# Patient Record
Sex: Female | Born: 1965 | Race: White | Hispanic: No | Marital: Married | Smoking: Current every day smoker
Health system: Southern US, Community
[De-identification: ages and names within clinical notes are randomized; demographics above are authoritative.]

## PROBLEM LIST (undated history)

## (undated) DIAGNOSIS — E079 Disorder of thyroid, unspecified: Secondary | ICD-10-CM

## (undated) DIAGNOSIS — I319 Disease of pericardium, unspecified: Secondary | ICD-10-CM

## (undated) DIAGNOSIS — J45909 Unspecified asthma, uncomplicated: Secondary | ICD-10-CM

## (undated) DIAGNOSIS — Z87442 Personal history of urinary calculi: Secondary | ICD-10-CM

## (undated) DIAGNOSIS — G629 Polyneuropathy, unspecified: Secondary | ICD-10-CM

## (undated) DIAGNOSIS — J449 Chronic obstructive pulmonary disease, unspecified: Secondary | ICD-10-CM

## (undated) DIAGNOSIS — I10 Essential (primary) hypertension: Secondary | ICD-10-CM

## (undated) DIAGNOSIS — M1990 Unspecified osteoarthritis, unspecified site: Secondary | ICD-10-CM

## (undated) HISTORY — PX: APPENDECTOMY: SHX54

## (undated) HISTORY — PX: JOINT REPLACEMENT: SHX530

## (undated) HISTORY — PX: OVARIAN CYST SURGERY: SHX726

## (undated) HISTORY — DX: Chronic obstructive pulmonary disease, unspecified: J44.9

## (undated) HISTORY — PX: OTHER SURGICAL HISTORY: SHX169

## (undated) HISTORY — DX: Unspecified osteoarthritis, unspecified site: M19.90

## (undated) HISTORY — DX: Essential (primary) hypertension: I10

## (undated) HISTORY — DX: Polyneuropathy, unspecified: G62.9

## (undated) HISTORY — PX: TOTAL HIP ARTHROPLASTY: SHX124

## (undated) HISTORY — DX: Unspecified asthma, uncomplicated: J45.909

## (undated) HISTORY — PX: LEG SURGERY: SHX1003

## (undated) HISTORY — DX: Personal history of urinary calculi: Z87.442

---

## 2004-02-17 ENCOUNTER — Emergency Department: Payer: Self-pay | Admitting: Emergency Medicine

## 2004-09-11 ENCOUNTER — Emergency Department: Payer: Self-pay | Admitting: Unknown Physician Specialty

## 2004-09-11 ENCOUNTER — Other Ambulatory Visit: Payer: Self-pay

## 2005-04-28 ENCOUNTER — Emergency Department: Payer: Self-pay | Admitting: Emergency Medicine

## 2006-01-28 ENCOUNTER — Emergency Department: Payer: Self-pay | Admitting: Emergency Medicine

## 2006-05-18 ENCOUNTER — Other Ambulatory Visit: Payer: Self-pay

## 2006-05-18 ENCOUNTER — Emergency Department: Payer: Self-pay | Admitting: Emergency Medicine

## 2006-05-20 ENCOUNTER — Emergency Department: Payer: Self-pay | Admitting: Emergency Medicine

## 2006-05-21 ENCOUNTER — Encounter: Payer: Self-pay | Admitting: Emergency Medicine

## 2006-05-25 ENCOUNTER — Emergency Department: Payer: Self-pay | Admitting: Emergency Medicine

## 2006-05-28 ENCOUNTER — Encounter: Payer: Self-pay | Admitting: Emergency Medicine

## 2006-08-12 ENCOUNTER — Emergency Department: Payer: Self-pay | Admitting: Unknown Physician Specialty

## 2006-08-12 ENCOUNTER — Other Ambulatory Visit: Payer: Self-pay

## 2007-02-23 ENCOUNTER — Emergency Department: Payer: Self-pay | Admitting: Emergency Medicine

## 2008-06-11 ENCOUNTER — Ambulatory Visit: Payer: Self-pay | Admitting: Family Medicine

## 2008-06-29 ENCOUNTER — Emergency Department: Payer: Self-pay | Admitting: Emergency Medicine

## 2008-09-03 ENCOUNTER — Encounter: Payer: Self-pay | Admitting: Family Medicine

## 2008-09-25 ENCOUNTER — Encounter: Payer: Self-pay | Admitting: Family Medicine

## 2008-09-30 ENCOUNTER — Emergency Department: Payer: Self-pay | Admitting: Emergency Medicine

## 2008-10-19 ENCOUNTER — Emergency Department: Payer: Self-pay | Admitting: Emergency Medicine

## 2008-12-02 ENCOUNTER — Emergency Department: Payer: Self-pay | Admitting: Emergency Medicine

## 2008-12-03 ENCOUNTER — Inpatient Hospital Stay: Payer: Self-pay | Admitting: Internal Medicine

## 2009-05-09 ENCOUNTER — Emergency Department: Payer: Self-pay | Admitting: Emergency Medicine

## 2009-07-02 ENCOUNTER — Ambulatory Visit: Payer: Self-pay | Admitting: Certified Nurse Midwife

## 2010-01-17 ENCOUNTER — Emergency Department: Payer: Self-pay | Admitting: Unknown Physician Specialty

## 2010-01-23 ENCOUNTER — Emergency Department: Payer: Self-pay | Admitting: Emergency Medicine

## 2011-10-04 ENCOUNTER — Emergency Department: Payer: Self-pay | Admitting: Emergency Medicine

## 2012-01-27 ENCOUNTER — Emergency Department: Payer: Self-pay | Admitting: Emergency Medicine

## 2012-04-13 ENCOUNTER — Emergency Department: Payer: Self-pay | Admitting: Emergency Medicine

## 2012-09-08 ENCOUNTER — Emergency Department: Payer: Self-pay | Admitting: Emergency Medicine

## 2012-09-08 LAB — URINALYSIS, COMPLETE
Bilirubin,UR: NEGATIVE
Nitrite: NEGATIVE
Ph: 7 (ref 4.5–8.0)
Protein: NEGATIVE
RBC,UR: 2 /HPF (ref 0–5)
Squamous Epithelial: 6

## 2012-09-08 LAB — BASIC METABOLIC PANEL
Anion Gap: 4 — ABNORMAL LOW (ref 7–16)
BUN: 14 mg/dL (ref 7–18)
Co2: 30 mmol/L (ref 21–32)
Creatinine: 1.02 mg/dL (ref 0.60–1.30)
Glucose: 89 mg/dL (ref 65–99)
Osmolality: 277 (ref 275–301)
Potassium: 3.8 mmol/L (ref 3.5–5.1)
Sodium: 139 mmol/L (ref 136–145)

## 2012-09-08 LAB — CBC
HGB: 13.7 g/dL (ref 12.0–16.0)
MCH: 29.3 pg (ref 26.0–34.0)
MCV: 88 fL (ref 80–100)
Platelet: 419 10*3/uL (ref 150–440)
WBC: 12.5 10*3/uL — ABNORMAL HIGH (ref 3.6–11.0)

## 2012-09-08 LAB — PREGNANCY, URINE: Pregnancy Test, Urine: NEGATIVE m[IU]/mL

## 2013-09-29 ENCOUNTER — Ambulatory Visit: Payer: Self-pay | Admitting: Family Medicine

## 2013-09-29 LAB — HEPATIC FUNCTION PANEL
ALT: 23 U/L (ref 7–35)
AST: 16 U/L (ref 13–35)

## 2014-01-18 ENCOUNTER — Emergency Department: Payer: Self-pay | Admitting: Student

## 2014-06-05 ENCOUNTER — Ambulatory Visit: Payer: Self-pay | Admitting: Family Medicine

## 2014-06-05 LAB — TSH: TSH: 1.46 u[IU]/mL (ref 0.41–5.90)

## 2014-06-05 LAB — BASIC METABOLIC PANEL
BUN: 15 mg/dL (ref 4–21)
CREATININE: 0.8 mg/dL (ref 0.5–1.1)
Glucose: 94 mg/dL
Potassium: 4.5 mmol/L (ref 3.4–5.3)
Sodium: 139 mmol/L (ref 137–147)

## 2014-06-05 LAB — LIPID PANEL
CHOLESTEROL: 128 mg/dL (ref 0–200)
HDL: 34 mg/dL — AB (ref 35–70)
LDL Cholesterol: 75 mg/dL
Triglycerides: 95 mg/dL (ref 40–160)

## 2014-08-05 ENCOUNTER — Emergency Department: Admit: 2014-08-05 | Disposition: A | Discharge status: Home / Self Care | Payer: Self-pay | Admitting: Internal Medicine

## 2014-08-05 LAB — COMPREHENSIVE METABOLIC PANEL
ALT: 31 U/L
AST: 30 U/L
Albumin: 3.9 g/dL
Alkaline Phosphatase: 60 U/L
Anion Gap: 8 (ref 7–16)
BUN: 23 mg/dL — ABNORMAL HIGH
Bilirubin,Total: 0.9 mg/dL
CALCIUM: 8.2 mg/dL — AB
CHLORIDE: 102 mmol/L
CO2: 25 mmol/L
Creatinine: 0.86 mg/dL
EGFR (African American): >60
GLUCOSE: 136 mg/dL — AB
POTASSIUM: 4.5 mmol/L
SODIUM: 135 mmol/L
Total Protein: 7.8 g/dL

## 2014-08-05 LAB — URINALYSIS, COMPLETE
BLOOD: NEGATIVE
Bilirubin,UR: NEGATIVE
Glucose,UR: NEGATIVE mg/dL (ref 0–75)
Leukocyte Esterase: NEGATIVE
Nitrite: NEGATIVE
PH: 5 (ref 4.5–8.0)
PROTEIN: NEGATIVE
Specific Gravity: 1.025 (ref 1.003–1.030)

## 2014-08-05 LAB — CBC
HCT: 44.8 % (ref 35.0–47.0)
HGB: 14.4 g/dL (ref 12.0–16.0)
MCH: 28.5 pg (ref 26.0–34.0)
MCHC: 32.1 g/dL (ref 32.0–36.0)
MCV: 89 fL (ref 80–100)
Platelet: 362 10*3/uL (ref 150–440)
RBC: 5.04 10*6/uL (ref 3.80–5.20)
RDW: 15.6 % — AB (ref 11.5–14.5)
WBC: 12.9 10*3/uL — ABNORMAL HIGH (ref 3.6–11.0)

## 2014-08-05 LAB — LIPASE, BLOOD: LIPASE: 45 U/L

## 2014-09-29 ENCOUNTER — Other Ambulatory Visit: Payer: Self-pay | Admitting: Family Medicine

## 2014-11-29 ENCOUNTER — Other Ambulatory Visit: Payer: Self-pay | Admitting: Family Medicine

## 2014-11-29 DIAGNOSIS — M545 Low back pain, unspecified: Secondary | ICD-10-CM | POA: Insufficient documentation

## 2014-11-29 DIAGNOSIS — F17210 Nicotine dependence, cigarettes, uncomplicated: Secondary | ICD-10-CM | POA: Insufficient documentation

## 2014-11-29 DIAGNOSIS — E039 Hypothyroidism, unspecified: Secondary | ICD-10-CM | POA: Insufficient documentation

## 2014-11-29 DIAGNOSIS — I10 Essential (primary) hypertension: Secondary | ICD-10-CM | POA: Insufficient documentation

## 2014-11-29 DIAGNOSIS — Z87442 Personal history of urinary calculi: Secondary | ICD-10-CM

## 2014-11-29 DIAGNOSIS — J45909 Unspecified asthma, uncomplicated: Secondary | ICD-10-CM | POA: Insufficient documentation

## 2014-11-29 DIAGNOSIS — F439 Reaction to severe stress, unspecified: Secondary | ICD-10-CM | POA: Insufficient documentation

## 2014-11-29 DIAGNOSIS — F329 Major depressive disorder, single episode, unspecified: Secondary | ICD-10-CM | POA: Insufficient documentation

## 2014-11-29 DIAGNOSIS — F4310 Post-traumatic stress disorder, unspecified: Secondary | ICD-10-CM | POA: Insufficient documentation

## 2014-11-29 DIAGNOSIS — F17200 Nicotine dependence, unspecified, uncomplicated: Secondary | ICD-10-CM | POA: Insufficient documentation

## 2014-11-29 DIAGNOSIS — E559 Vitamin D deficiency, unspecified: Secondary | ICD-10-CM | POA: Insufficient documentation

## 2014-11-29 DIAGNOSIS — R109 Unspecified abdominal pain: Secondary | ICD-10-CM | POA: Insufficient documentation

## 2014-11-29 DIAGNOSIS — K219 Gastro-esophageal reflux disease without esophagitis: Secondary | ICD-10-CM | POA: Insufficient documentation

## 2014-11-29 DIAGNOSIS — F32A Depression, unspecified: Secondary | ICD-10-CM | POA: Insufficient documentation

## 2014-11-29 DIAGNOSIS — G4700 Insomnia, unspecified: Secondary | ICD-10-CM | POA: Insufficient documentation

## 2014-11-29 DIAGNOSIS — L732 Hidradenitis suppurativa: Secondary | ICD-10-CM | POA: Insufficient documentation

## 2014-11-29 DIAGNOSIS — Z8659 Personal history of other mental and behavioral disorders: Secondary | ICD-10-CM | POA: Insufficient documentation

## 2014-11-29 DIAGNOSIS — M1990 Unspecified osteoarthritis, unspecified site: Secondary | ICD-10-CM | POA: Insufficient documentation

## 2014-11-29 DIAGNOSIS — Z789 Other specified health status: Secondary | ICD-10-CM | POA: Insufficient documentation

## 2014-11-29 DIAGNOSIS — R200 Anesthesia of skin: Secondary | ICD-10-CM | POA: Insufficient documentation

## 2014-11-29 DIAGNOSIS — F419 Anxiety disorder, unspecified: Secondary | ICD-10-CM | POA: Insufficient documentation

## 2014-11-29 DIAGNOSIS — Z87898 Personal history of other specified conditions: Secondary | ICD-10-CM | POA: Insufficient documentation

## 2014-11-29 DIAGNOSIS — J449 Chronic obstructive pulmonary disease, unspecified: Secondary | ICD-10-CM | POA: Insufficient documentation

## 2014-11-29 DIAGNOSIS — Z720 Tobacco use: Secondary | ICD-10-CM

## 2014-11-29 DIAGNOSIS — E785 Hyperlipidemia, unspecified: Secondary | ICD-10-CM | POA: Insufficient documentation

## 2014-11-29 HISTORY — DX: Personal history of urinary calculi: Z87.442

## 2014-11-29 NOTE — Telephone Encounter (Signed)
Next ov is on 08/30/2014

## 2014-11-30 ENCOUNTER — Ambulatory Visit (INDEPENDENT_AMBULATORY_CARE_PROVIDER_SITE_OTHER): Payer: Medicaid Other | Admitting: Family Medicine

## 2014-11-30 ENCOUNTER — Encounter: Payer: Self-pay | Admitting: Family Medicine

## 2014-11-30 VITALS — BP 122/64 | HR 85 | Temp 98.5°F | Resp 16 | Wt 324.2 lb

## 2014-11-30 DIAGNOSIS — R0609 Other forms of dyspnea: Secondary | ICD-10-CM | POA: Diagnosis not present

## 2014-11-30 DIAGNOSIS — R609 Edema, unspecified: Secondary | ICD-10-CM

## 2014-11-30 DIAGNOSIS — J45909 Unspecified asthma, uncomplicated: Secondary | ICD-10-CM | POA: Diagnosis not present

## 2014-11-30 DIAGNOSIS — M1990 Unspecified osteoarthritis, unspecified site: Secondary | ICD-10-CM

## 2014-11-30 DIAGNOSIS — E039 Hypothyroidism, unspecified: Secondary | ICD-10-CM | POA: Diagnosis not present

## 2014-11-30 DIAGNOSIS — I10 Essential (primary) hypertension: Secondary | ICD-10-CM | POA: Diagnosis not present

## 2014-11-30 DIAGNOSIS — I890 Lymphedema, not elsewhere classified: Secondary | ICD-10-CM | POA: Insufficient documentation

## 2014-11-30 MED ORDER — MOMETASONE FURO-FORMOTEROL FUM 100-5 MCG/ACT IN AERO: 2.0000 | INHALATION_SPRAY | Freq: Two times a day (BID) | RESPIRATORY_TRACT | Status: DC

## 2014-11-30 MED ORDER — FUROSEMIDE 20 MG PO TABS: 20.0000 mg | ORAL_TABLET | Freq: Every day | ORAL | Status: DC

## 2014-11-30 NOTE — Progress Notes (Signed)
Patient ID: Cheryl Chandler, female   DOB: [birth year], 49 y.o.   MRN: [id number]       Patient: Cheryl Chandler Female    DOB: 1965-06-29   49 y.o.   MRN: [id number] Visit Date: 11/30/2014  Today's Provider: Mila Merry, MD   Chief Complaint  Patient presents with  . Hyperlipidemia    follow-up  . Hypertension    follow-up  . Hypothyroidism    follow-up   Subjective:    Hyperlipidemia This is a chronic problem. The current episode started more than 1 year ago. Exacerbating diseases include obesity. Associated symptoms include leg pain and shortness of breath. Risk factors for coronary artery disease include family history, hypertension and obesity.  Hypertension This is a chronic problem. Associated symptoms include anxiety, headaches, palpitations, peripheral edema and shortness of breath.       Allergies  Allergen Reactions  . Codeine Shortness Of Breath and Rash  . Sulfa Antibiotics Shortness Of Breath and Rash  . Aspirin Hives  . Bee Venom     Bee stings  . Darvon [Propoxyphene]     Darvocet-N 100  . Latex   . Penicillins    Previous Medications   ALBUTEROL (PROAIR HFA) 108 (90 BASE) MCG/ACT INHALER    Inhale into the lungs.   ALBUTEROL (PROVENTIL) (2.5 MG/3ML) 0.083% NEBULIZER SOLUTION    Inhale into the lungs.   BECLOMETHASONE (QVAR) 80 MCG/ACT INHALER    Inhale into the lungs.   CHOLECALCIFEROL (VITAMIN D3) 2000 UNITS TABS    Take by mouth.   CITALOPRAM (CELEXA) 20 MG TABLET    take 1 tablet by mouth once daily   EPIPEN 2-PAK 0.3 MG/0.3ML SOAJ INJECTION    use as directed by prescriber   HYDROCODONE-ACETAMINOPHEN (NORCO/VICODIN) 5-325 MG PER TABLET    Take by mouth.   LEVOTHYROXINE (SYNTHROID, LEVOTHROID) 50 MCG TABLET    Take by mouth.   LISINOPRIL (PRINIVIL,ZESTRIL) 20 MG TABLET    Take by mouth.   MELOXICAM (MOBIC) 15 MG TABLET    take 1 tablet by mouth once daily if needed for pain   METHOCARBAMOL (ROBAXIN) 750 MG TABLET    Take by mouth.   MONTELUKAST (SINGULAIR) 10 MG TABLET    Take by mouth.   NITROGLYCERIN (NITROSTAT) 0.4 MG SL TABLET    Place under the tongue.   OMEPRAZOLE (PRILOSEC) 20 MG CAPSULE    take 1 capsule by mouth once daily   SIMVASTATIN (ZOCOR) 40 MG TABLET    Take by mouth.    Review of Systems  Respiratory: Positive for chest tightness and shortness of breath.   Cardiovascular: Positive for palpitations and leg swelling.  Neurological: Positive for weakness and headaches.  Psychiatric/Behavioral: Positive for sleep disturbance.    History  Substance Use Topics  . Smoking status: Current Every Day Smoker -- 0.25 packs/day for 30 years    Types: Cigarettes  . Smokeless tobacco: Never Used  . Alcohol Use: No   Objective:   BP 122/64 mmHg  Pulse 85  Temp(Src) 98.5 F (36.9 C) (Oral)  Resp 16  Wt 324 lb 3.2 oz (147.056 kg)  LMP 11/18/2014  Physical Exam  General Appearance:    Alert, cooperative, no distress, obese  Eyes:    PERRL, conjunctiva/corneas clear, EOM's intact       Lungs:     Clear to auscultation bilaterally, respirations unlabored  Heart:    Regular rate and rhythm, 2+ bilateral edema  Neurologic:   Awake, alert,  oriented x 3. No apparent focal neurological           defect.          Assessment & Plan:     1. Hypothyroidism, unspecified hypothyroidism type  - T4 AND TSH  2. Essential hypertension well controlled Continue current medications.     3. Arthritis   4. Edema  - Comprehensive metabolic panel - CBC - furosemide (LASIX) 20 MG tablet; Take 1 tablet (20 mg total) by mouth daily.  Dispense: 30 tablet; Refill: 0  5. Dyspnea on exertion Considering edema will need to rule out cardiac origin.  - Brain natriuretic peptide  6. Airway hyperreactivity, unspecified asthma severity, uncomplicated Not controlled.  - mometasone-formoterol (DULERA) 100-5 MCG/ACT AERO; Inhale 2 puffs into the lungs 2 (two) times daily.  Dispense: 1 Inhaler; Refill: 0       Mila Merry, MD  [facility]

## 2014-12-01 LAB — COMPREHENSIVE METABOLIC PANEL
ALBUMIN: 4.1 g/dL (ref 3.5–5.5)
ALT: 43 IU/L — ABNORMAL HIGH (ref 0–32)
AST: 37 IU/L (ref 0–40)
Albumin/Globulin Ratio: 1.5 (ref 1.1–2.5)
Alkaline Phosphatase: 80 IU/L (ref 39–117)
BUN/Creatinine Ratio: 19 (ref 9–23)
BUN: 16 mg/dL (ref 6–24)
Bilirubin Total: 0.4 mg/dL (ref 0.0–1.2)
CO2: 23 mmol/L (ref 18–29)
Calcium: 8.9 mg/dL (ref 8.7–10.2)
Chloride: 100 mmol/L (ref 97–108)
Creatinine, Ser: 0.83 mg/dL (ref 0.57–1.00)
GFR calc non Af Amer: 84 mL/min/{1.73_m2} (ref >59)
GFR, EST AFRICAN AMERICAN: 96 mL/min/{1.73_m2} (ref >59)
GLOBULIN, TOTAL: 2.7 g/dL (ref 1.5–4.5)
GLUCOSE: 93 mg/dL (ref 65–99)
Potassium: 4.8 mmol/L (ref 3.5–5.2)
Sodium: 140 mmol/L (ref 134–144)
TOTAL PROTEIN: 6.8 g/dL (ref 6.0–8.5)

## 2014-12-01 LAB — CBC
HEMATOCRIT: 39.7 % (ref 34.0–46.6)
HEMOGLOBIN: 13.2 g/dL (ref 11.1–15.9)
MCH: 28.4 pg (ref 26.6–33.0)
MCHC: 33.2 g/dL (ref 31.5–35.7)
MCV: 86 fL (ref 79–97)
PLATELETS: 406 10*3/uL — AB (ref 150–379)
RBC: 4.64 x10E6/uL (ref 3.77–5.28)
RDW: 15.7 % — ABNORMAL HIGH (ref 12.3–15.4)
WBC: 10.6 10*3/uL (ref 3.4–10.8)

## 2014-12-01 LAB — T4 AND TSH
T4, Total: 9.9 ug/dL (ref 4.5–12.0)
TSH: 1.51 u[IU]/mL (ref 0.450–4.500)

## 2014-12-01 LAB — BRAIN NATRIURETIC PEPTIDE: BNP: 7.4 pg/mL (ref 0.0–100.0)

## 2014-12-03 ENCOUNTER — Telehealth: Payer: Self-pay | Admitting: Family Medicine

## 2014-12-03 NOTE — Telephone Encounter (Signed)
Patient notified of results.

## 2014-12-03 NOTE — Telephone Encounter (Signed)
Pt returned call

## 2014-12-03 NOTE — Telephone Encounter (Signed)
Ivar Drape called to get pt's results and then pt got on the phone and asked for her results. I advised I would send a message for a nurse to return her call. Thanks TNP

## 2014-12-05 ENCOUNTER — Telehealth: Payer: Self-pay | Admitting: Family Medicine

## 2014-12-05 NOTE — Telephone Encounter (Signed)
Pt has some questions about her labs and whether she is early stages of diabetes.  Please call her back.    Thanks Barth Kirks

## 2014-12-05 NOTE — Telephone Encounter (Signed)
Her blood sugar was normal. She does not have diabetes.

## 2014-12-05 NOTE — Telephone Encounter (Signed)
Please advise patient's question about diabetes?

## 2014-12-13 NOTE — Telephone Encounter (Signed)
Left message letting pt know.  Okay per dpr. Advised pt to cb if she has anymore questions.

## 2015-01-01 ENCOUNTER — Other Ambulatory Visit: Payer: Self-pay | Admitting: *Deleted

## 2015-01-01 DIAGNOSIS — R609 Edema, unspecified: Secondary | ICD-10-CM

## 2015-01-01 MED ORDER — FUROSEMIDE 20 MG PO TABS: 20.0000 mg | ORAL_TABLET | Freq: Every day | ORAL | Status: DC

## 2015-01-29 ENCOUNTER — Other Ambulatory Visit: Payer: Self-pay | Admitting: Family Medicine

## 2015-01-29 DIAGNOSIS — J45909 Unspecified asthma, uncomplicated: Secondary | ICD-10-CM

## 2015-01-29 MED ORDER — NITROGLYCERIN 0.4 MG SL SUBL: 0.4000 mg | SUBLINGUAL_TABLET | SUBLINGUAL | Status: DC | PRN

## 2015-01-29 MED ORDER — SIMVASTATIN 40 MG PO TABS: 40.0000 mg | ORAL_TABLET | Freq: Every day | ORAL | Status: DC

## 2015-01-29 MED ORDER — LISINOPRIL 20 MG PO TABS: 20.0000 mg | ORAL_TABLET | Freq: Every day | ORAL | Status: DC

## 2015-01-29 MED ORDER — MOMETASONE FURO-FORMOTEROL FUM 100-5 MCG/ACT IN AERO: 2.0000 | INHALATION_SPRAY | Freq: Two times a day (BID) | RESPIRATORY_TRACT | Status: DC

## 2015-01-29 NOTE — Telephone Encounter (Signed)
Last OV was on 11/30/2014. Pt stated that she wants something for back and leg pain. She said that she takes meloxicam and robaxin for pain which helps some, she also stated that she cannot take hydrocodone-acetaminophen anymore because she is allergic to codeine and when she takes it she gets sick with upper respiratory disease.  Please advise.  Thanks,

## 2015-01-29 NOTE — Telephone Encounter (Signed)
Pt contacted office for refill request on the following medications:  lisinopril (PRINIVIL,ZESTRIL) 20 MG,  simvastatin (ZOCOR) 40 MG and mometasone-formoterol (DULERA) 100-5 MCG/ACT AERO.  [facility] [place].  CB#[phone]/MW  Pt is also requesting a Rx for back and leg pain.  Pt states she can not take the HYDROcodone-acetaminophen/MW

## 2015-01-30 ENCOUNTER — Telehealth: Payer: Self-pay | Admitting: Family Medicine

## 2015-01-30 ENCOUNTER — Other Ambulatory Visit: Payer: Self-pay | Admitting: Family Medicine

## 2015-01-30 MED ORDER — TRAMADOL HCL 50 MG PO TABS: 50.0000 mg | ORAL_TABLET | Freq: Three times a day (TID) | ORAL | Status: DC | PRN

## 2015-01-30 NOTE — Telephone Encounter (Signed)
Please advise 

## 2015-01-30 NOTE — Telephone Encounter (Signed)
Can call in Tramadol  one every eight hours as needed for pain, #30, rf x 3.

## 2015-01-30 NOTE — Telephone Encounter (Signed)
Pt stated that her pain is getting worse and she thought a stronger pain medication was supposed to be sent to [facility] in [place] and to her knowledge nothing was sent in. Please advise. Thanks TNP

## 2015-01-30 NOTE — Telephone Encounter (Signed)
Rx called into pharmacy. Patient notified.

## 2015-02-28 ENCOUNTER — Other Ambulatory Visit: Payer: Self-pay | Admitting: Family Medicine

## 2015-03-29 ENCOUNTER — Other Ambulatory Visit: Payer: Self-pay | Admitting: Family Medicine

## 2015-05-07 ENCOUNTER — Ambulatory Visit: Payer: Medicaid Other | Admitting: Family Medicine

## 2015-05-31 ENCOUNTER — Other Ambulatory Visit: Payer: Self-pay | Admitting: Family Medicine

## 2015-05-31 NOTE — Telephone Encounter (Signed)
Please call in tramadol.  

## 2015-05-31 NOTE — Telephone Encounter (Signed)
Rx called in to pharmacy. 

## 2015-06-28 ENCOUNTER — Other Ambulatory Visit: Payer: Self-pay | Admitting: Family Medicine

## 2015-07-01 ENCOUNTER — Ambulatory Visit: Payer: Medicaid Other | Admitting: Family Medicine

## 2015-07-28 ENCOUNTER — Other Ambulatory Visit: Payer: Self-pay | Admitting: Family Medicine

## 2015-07-29 ENCOUNTER — Other Ambulatory Visit: Payer: Self-pay | Admitting: Family Medicine

## 2015-07-29 NOTE — Telephone Encounter (Signed)
Pt contacted office for refill request on the following medications:  [facility] [place]  CB#[phone]/MW  simvastatin (ZOCOR) 40 MG tablet  furosemide (LASIX) 20 MG tablet  lisinopril (PRINIVIL,ZESTRIL) 20 MG tablet  levothyroxine (SYNTHROID, LEVOTHROID) 50 MCG tablet

## 2015-07-29 NOTE — Telephone Encounter (Signed)
Rx's were sent to pharmacy 07/28/15.

## 2015-08-06 ENCOUNTER — Encounter: Payer: Self-pay | Admitting: Family Medicine

## 2015-09-27 ENCOUNTER — Other Ambulatory Visit: Payer: Self-pay | Admitting: Family Medicine

## 2015-09-27 NOTE — Telephone Encounter (Signed)
Rx called in to pharmacy. 

## 2015-09-27 NOTE — Telephone Encounter (Signed)
Please call in tramadol.  

## 2015-09-30 ENCOUNTER — Encounter: Payer: Self-pay | Admitting: Family Medicine

## 2015-09-30 ENCOUNTER — Ambulatory Visit (INDEPENDENT_AMBULATORY_CARE_PROVIDER_SITE_OTHER): Payer: Medicaid Other | Admitting: Family Medicine

## 2015-09-30 VITALS — BP 116/70 | HR 92 | Temp 98.4°F | Resp 16 | Ht 63.0 in | Wt 319.0 lb

## 2015-09-30 DIAGNOSIS — J45909 Unspecified asthma, uncomplicated: Secondary | ICD-10-CM

## 2015-09-30 DIAGNOSIS — M5440 Lumbago with sciatica, unspecified side: Secondary | ICD-10-CM | POA: Diagnosis not present

## 2015-09-30 DIAGNOSIS — M542 Cervicalgia: Secondary | ICD-10-CM

## 2015-09-30 DIAGNOSIS — R0602 Shortness of breath: Secondary | ICD-10-CM

## 2015-09-30 MED ORDER — GABAPENTIN 100 MG PO CAPS
100.0000 mg | ORAL_CAPSULE | Freq: Every day | ORAL | Status: DC
Start: 2015-09-30 — End: 2015-11-29

## 2015-09-30 NOTE — Progress Notes (Signed)
Patient: Cheryl Chandler Female    DOB: Sep 05, 1965   50 y.o.   MRN: [id number] Visit Date: 09/30/2015  Today's Provider: Mila Merry, MD   Chief Complaint  Patient presents with  . Follow-up  . Leg Pain  . Back Pain   Subjective:    HPI  Follow-up for bilateral leg and back pain; patient was prescribed tramadol 50 mg every 8 hours if needed on 01/30/2015.  Feels like a knife stabbing into front of legs all the way to toes which is constant. Had LS spine in June 2015 showing DDD. Pain keeps her up at night.   Patient states that her back and leg pain has gotten worse. Patient has fallen several times recently due balance and gait issues.  Pain in back starts in neck and goes all the way down to lumbar spine.   Patient is also having sob upon exertion and wheezing over the last few months. Is on Dulera and montelukast  Wt Readings from Last 3 Encounters:  09/30/15 319 lb (144.697 kg)  11/30/14 324 lb 3.2 oz (147.056 kg)  06/22/14 319 lb (144.697 kg)     Allergies  Allergen Reactions  . Codeine Shortness Of Breath and Rash  . Sulfa Antibiotics Shortness Of Breath and Rash  . Aspirin Hives  . Bee Venom     Bee stings  . Darvon [Propoxyphene]     Darvocet-N 100  . Latex   . Penicillins    Previous Medications   ALBUTEROL (PROAIR HFA) 108 (90 BASE) MCG/ACT INHALER    Inhale into the lungs.   ALBUTEROL (PROVENTIL) (2.5 MG/3ML) 0.083% NEBULIZER SOLUTION    Inhale into the lungs.   CHOLECALCIFEROL (VITAMIN D3) 2000 UNITS TABS    Take by mouth.   CITALOPRAM (CELEXA) 20 MG TABLET    take 1 tablet by mouth once daily   DULERA 100-5 MCG/ACT AERO    inhale 2 puffs by mouth twice a day   EPIPEN 2-PAK 0.3 MG/0.3ML SOAJ INJECTION    use as directed by prescriber   FUROSEMIDE (LASIX) 20 MG TABLET    take 1 tablet by mouth once daily   LEVOTHYROXINE (SYNTHROID, LEVOTHROID) 50 MCG TABLET    take 1 tablet by mouth once daily   LISINOPRIL (PRINIVIL,ZESTRIL) 20 MG TABLET     take 1 tablet by mouth once daily   MELOXICAM (MOBIC) 15 MG TABLET    take 1 tablet by mouth once daily if needed for pain   METHOCARBAMOL (ROBAXIN) 750 MG TABLET    take 1-2 tablets by mouth three times a day if needed for back pain   MONTELUKAST (SINGULAIR) 10 MG TABLET    take 1 tablet by mouth once daily   NITROSTAT 0.4 MG SL TABLET    place 1 tablet under the tongue if needed every 5 minutes for chest pain   OMEPRAZOLE (PRILOSEC) 20 MG CAPSULE    take 1 capsule by mouth once daily   SIMVASTATIN (ZOCOR) 40 MG TABLET    take 1 tablet by mouth once daily   TRAMADOL (ULTRAM) 50 MG TABLET    take 1 tablet by mouth every 8 hours if needed for pain    Review of Systems  Constitutional: Negative for fever, chills, appetite change and fatigue.  Respiratory: Positive for shortness of breath and wheezing. Negative for chest tightness.   Cardiovascular: Positive for leg swelling. Negative for chest pain and palpitations.  Gastrointestinal: Negative for nausea, vomiting  and abdominal pain.  Musculoskeletal: Positive for back pain, arthralgias and gait problem.  Neurological: Negative for dizziness and weakness.    Social History  Substance Use Topics  . Smoking status: Current Every Day Smoker -- 0.25 packs/day for 30 years    Types: Cigarettes  . Smokeless tobacco: Never Used  . Alcohol Use: No   Objective:   BP 116/70 mmHg  Pulse 92  Temp(Src) 98.4 F (36.9 C) (Oral)  Resp 16  Ht 5\' 3"  (1.6 m)  Wt 319 lb (144.697 kg)  BMI 56.52 kg/m2  SpO2 97%  Physical Exam  General Appearance:    Alert, cooperative, no distress, morbidly obese  Eyes:    PERRL, conjunctiva/corneas clear, EOM's intact       Lungs:     Clear to auscultation bilaterally, respirations unlabored  Heart:    Regular rate and rhythm  Neurologic:   Awake, alert, oriented x 3. No apparent focal neurological           defect.           Assessment & Plan:     1. Airway hyperreactivity, unspecified asthma severity,  uncomplicated Already on Dulera, Singulair, and rescue inhaler - DG Chest 2 View; Future  2. Shortness of breath  - DG Chest 2 View; Future  3. Midline low back pain with sciatica, sciatica laterality unspecified  - DG Lumbar Spine Complete; Future  4. Neck pain  - DG Cervical Spine Complete; Future - gabapentin (NEURONTIN) 100 MG capsule; Take 1-2 capsules (100-200 mg total) by mouth at bedtime.  Dispense: 30 capsule; Refill: 1      The entirety of the information documented in the History of Present Illness, Review of Systems and Physical Exam were personally obtained by me. Portions of this information were initially documented by April M. Hyacinth MeekerMiller, CMA and reviewed by me for thoroughness and accuracy.   Mila Merryonald Armida Vickroy, MD  [facility]

## 2015-10-01 ENCOUNTER — Ambulatory Visit
Admission: RE | Admit: 2015-10-01 | Discharge: 2015-10-01 | Disposition: A | Discharge status: Home / Self Care | Payer: Medicaid Other | Source: Ambulatory Visit | Attending: Family Medicine | Admitting: Family Medicine

## 2015-10-01 ENCOUNTER — Telehealth: Payer: Self-pay | Admitting: *Deleted

## 2015-10-01 DIAGNOSIS — M5440 Lumbago with sciatica, unspecified side: Secondary | ICD-10-CM | POA: Diagnosis not present

## 2015-10-01 DIAGNOSIS — J45909 Unspecified asthma, uncomplicated: Secondary | ICD-10-CM | POA: Diagnosis not present

## 2015-10-01 DIAGNOSIS — M5137 Other intervertebral disc degeneration, lumbosacral region: Secondary | ICD-10-CM | POA: Diagnosis not present

## 2015-10-01 DIAGNOSIS — R0602 Shortness of breath: Secondary | ICD-10-CM

## 2015-10-01 DIAGNOSIS — M5136 Other intervertebral disc degeneration, lumbar region: Secondary | ICD-10-CM | POA: Diagnosis not present

## 2015-10-01 DIAGNOSIS — M542 Cervicalgia: Secondary | ICD-10-CM | POA: Diagnosis present

## 2015-10-01 DIAGNOSIS — R938 Abnormal findings on diagnostic imaging of other specified body structures: Secondary | ICD-10-CM | POA: Insufficient documentation

## 2015-10-01 DIAGNOSIS — M545 Low back pain: Secondary | ICD-10-CM | POA: Diagnosis present

## 2015-10-01 NOTE — Telephone Encounter (Signed)
-----   Message from Malva Limesonald E Fisher, MD sent at 10/01/2015  1:00 PM EDT ----- Chest Xr shows signs of chronic bronchitis. Since [facility], I would recommend referral to pulmonologist for further evaluation and treatment options. Xrays of back and neck show degenerative disk disease, but are otherwise normal.

## 2015-10-01 NOTE — Telephone Encounter (Signed)
Please schedule referral to pulmonology.

## 2015-10-02 ENCOUNTER — Encounter: Payer: Self-pay | Admitting: Internal Medicine

## 2015-10-28 ENCOUNTER — Other Ambulatory Visit: Payer: Self-pay | Admitting: Family Medicine

## 2015-11-05 ENCOUNTER — Institutional Professional Consult (permissible substitution): Payer: Medicaid Other | Admitting: Internal Medicine

## 2015-11-11 ENCOUNTER — Telehealth: Payer: Self-pay | Admitting: Family Medicine

## 2015-11-11 ENCOUNTER — Telehealth: Payer: Self-pay

## 2015-11-11 ENCOUNTER — Encounter: Payer: Self-pay | Admitting: Family Medicine

## 2015-11-11 ENCOUNTER — Ambulatory Visit
Admission: RE | Admit: 2015-11-11 | Discharge: 2015-11-11 | Disposition: A | Discharge status: Home / Self Care | Payer: Medicaid Other | Source: Ambulatory Visit | Attending: Family Medicine | Admitting: Family Medicine

## 2015-11-11 ENCOUNTER — Ambulatory Visit (INDEPENDENT_AMBULATORY_CARE_PROVIDER_SITE_OTHER): Payer: Medicaid Other | Admitting: Family Medicine

## 2015-11-11 VITALS — BP 138/72 | HR 76 | Temp 98.9°F | Resp 32

## 2015-11-11 DIAGNOSIS — R0602 Shortness of breath: Secondary | ICD-10-CM | POA: Diagnosis not present

## 2015-11-11 DIAGNOSIS — J40 Bronchitis, not specified as acute or chronic: Secondary | ICD-10-CM | POA: Diagnosis not present

## 2015-11-11 DIAGNOSIS — R05 Cough: Secondary | ICD-10-CM

## 2015-11-11 DIAGNOSIS — R059 Cough, unspecified: Secondary | ICD-10-CM

## 2015-11-11 MED ORDER — ALBUTEROL SULFATE (2.5 MG/3ML) 0.083% IN NEBU: 2.5000 mg | INHALATION_SOLUTION | RESPIRATORY_TRACT | Status: DC | PRN

## 2015-11-11 MED ORDER — AZITHROMYCIN 250 MG PO TABS: ORAL_TABLET | ORAL | Status: AC

## 2015-11-11 NOTE — Telephone Encounter (Signed)
Pt advised.   Thanks,   -Rubye Strohmeyer  

## 2015-11-11 NOTE — Telephone Encounter (Signed)
-----   Message from Malva Limesonald E Fisher, MD sent at 11/11/2015  2:35 PM EDT ----- No pneumonia. Have sent prescription for azithromycin to her pharmacy for bronchitis. She needs to reduce citalopram to 1/2 tablet while taking azithromycin due to possible interaction.

## 2015-11-11 NOTE — Progress Notes (Signed)
Patient: Cheryl Chandler Female    DOB: 05/09/1965   [age] y.o.   MRN: [id number] Visit Date: 11/11/2015  Today's Provider: Mila [name], MD   Chief Complaint  Patient presents with  . URI  . Cough   Subjective:    URI  This is a new problem. Episode onset: 1 week ago. The problem has been gradually worsening. There has been no fever. Associated symptoms include abdominal pain, chest pain, congestion, coughing, diarrhea, headaches, joint pain, joint swelling, nausea, neck pain, rhinorrhea, sinus pain, sneezing, a sore throat, swollen glands, vomiting and wheezing. Pertinent negatives include no dysuria, ear pain, plugged ear sensation or rash. She has tried inhaler use (also OTC cough medication) for the symptoms. The treatment provided mild relief.  Cough Associated symptoms include chest pain, headaches, postnasal drip, rhinorrhea, a sore throat, shortness of breath and wheezing. Pertinent negatives include no chills, ear pain, fever or rash.       Allergies  Allergen Reactions  . Codeine Shortness Of Breath and Rash  . Sulfa Antibiotics Shortness Of Breath and Rash  . Aspirin Hives  . Bee Venom     Bee stings  . Darvon [Propoxyphene]     Darvocet-N 100  . Latex   . Penicillins    Current Meds  Medication Sig  . albuterol (PROAIR HFA) 108 (90 BASE) MCG/ACT inhaler Inhale into the lungs.  [name]. albuterol (PROVENTIL) (2.5 MG/3ML) 0.083% nebulizer solution Inhale into the lungs.  . Cholecalciferol (VITAMIN D3) 2000 UNITS TABS Take by mouth.  . citalopram (CELEXA) 20 MG tablet take 1 tablet by mouth once daily  . DULERA 100-5 MCG/ACT AERO inhale 2 puffs by mouth twice a day  . EPIPEN 2-PAK 0.3 MG/0.3ML SOAJ injection use as directed by prescriber  . furosemide (LASIX) 20 MG tablet take 1 tablet by mouth once daily  . gabapentin (NEURONTIN) 100 MG capsule Take 1-2 capsules (100-200 mg total) by mouth at bedtime.  [name]. levothyroxine (SYNTHROID, LEVOTHROID) 50 MCG tablet  take 1 tablet by mouth once daily  . lisinopril (PRINIVIL,ZESTRIL) 20 MG tablet take 1 tablet by mouth once daily  . meloxicam (MOBIC) 15 MG tablet take 1 tablet by mouth once daily if needed  . methocarbamol (ROBAXIN) 750 MG tablet take 1-2 tablets by mouth three times a day if needed for back pain  . montelukast (SINGULAIR) 10 MG tablet take 1 tablet by mouth once daily  . NITROSTAT 0.4 MG SL tablet place 1 tablet under the tongue if needed every 5 minutes for chest pain  . omeprazole (PRILOSEC) 20 MG capsule take 1 capsule by mouth once daily  . simvastatin (ZOCOR) 40 MG tablet take 1 tablet by mouth once daily  . traMADol (ULTRAM) 50 MG tablet take 1 tablet by mouth every 8 hours if needed for pain    Review of Systems  Constitutional: Positive for diaphoresis and fatigue. Negative for fever, chills and appetite change.  HENT: Positive for congestion, postnasal drip, rhinorrhea, sinus pressure, sneezing, sore throat and voice change. Negative for ear pain, mouth sores and nosebleeds.   Eyes: Positive for discharge (watery eyes).  Respiratory: Positive for cough, shortness of breath and wheezing. Negative for chest tightness.   Cardiovascular: Positive for chest pain and leg swelling. Negative for palpitations.  Gastrointestinal: Positive for nausea, vomiting, abdominal pain and diarrhea.  Genitourinary: Negative for dysuria.  Musculoskeletal: Positive for joint pain and neck pain.  Skin: Negative for rash.  Neurological: Positive for  dizziness, weakness, light-headedness and headaches.    Social History  Substance Use Topics  . Smoking status: Current Every Day Smoker -- 0.25 packs/day for 30 years    Types: Cigarettes  . Smokeless tobacco: Never Used  . Alcohol Use: No   Objective:   BP 138/72 mmHg  Pulse 76  Temp(Src) 98.9 F (37.2 C) (Oral)  Resp 32  SpO2 95%  Physical Exam  General Appearance:    Alert, cooperative, no distress  HENT:   bilateral TM normal without  fluid or infection, neck without nodes, sinuses nontender and nasal mucosa pale and congested  Eyes:    PERRL, conjunctiva/corneas clear, EOM's intact       Lungs:     Occasional expiratory wheeze, no rales, Clear to auscultation bilaterally, respirations unlabored  Heart:    Regular rate and rhythm  Neurologic:   Awake, alert, oriented x 3. No apparent focal neurological           defect.            Assessment & Plan:     1. Cough Significantly improved after albuterol nebulizer administered.  - albuterol (PROVENTIL) (2.5 MG/3ML) 0.083% nebulizer solution; Take 3 mLs (2.5 mg total) by nebulization every 4 (four) hours as needed for wheezing or shortness of breath.  Dispense: 75 mL; Refill: 3 - DG Chest 2 View; Future  2. Shortness of breath  - albuterol (PROVENTIL) (2.5 MG/3ML) 0.083% nebulizer solution; Take 3 mLs (2.5 mg total) by nebulization every 4 (four) hours as needed for wheezing or shortness of breath.  Dispense: 75 mL; Refill: 3 - DG Chest 2 View; Future  3. Bronchitis  - azithromycin (ZITHROMAX) 250 MG tablet; 2 by mouth today, then 1 daily for 4 days  Dispense: 6 tablet; Refill: 0     The entirety of the information documented in the History of Present Illness, Review of Systems and Physical Exam were personally obtained by me. Portions of this information were initially documented by Awilda Bill, CMA and reviewed by me for thoroughness and accuracy.    Cheryl Merry, MD  [facility]

## 2015-11-11 NOTE — Telephone Encounter (Signed)
No message needed, just appt 11/11/15

## 2015-11-12 ENCOUNTER — Telehealth: Payer: Self-pay | Admitting: Family Medicine

## 2015-11-12 NOTE — Telephone Encounter (Signed)
Pt states she is using a nebulizer that was her grandfathers and it not working correctly/it is broken.  Pt is asking if she get a Rx to get a new nebulizer.    Pt is asking if she should keep taking the cough medication over the counter with the anitibiotic she got yesterday.  CB#[phone]/MW

## 2015-11-15 ENCOUNTER — Telehealth: Payer: Self-pay | Admitting: Family Medicine

## 2015-11-15 MED ORDER — LEVOFLOXACIN 500 MG PO TABS: 500.0000 mg | ORAL_TABLET | Freq: Every day | ORAL | Status: DC

## 2015-11-15 NOTE — Telephone Encounter (Signed)
[facility].

## 2015-11-15 NOTE — Telephone Encounter (Signed)
Patient notified

## 2015-11-15 NOTE — Telephone Encounter (Signed)
Pt husband states pt is still coughing and has congestion.  Pt finished the antibiotic today.  Pt is requesting another Rx.  [facility] [place].  [phone]#[phone]/MW

## 2015-11-18 ENCOUNTER — Encounter: Payer: Self-pay | Admitting: Emergency Medicine

## 2015-11-18 ENCOUNTER — Emergency Department: Payer: Medicaid Other

## 2015-11-18 ENCOUNTER — Emergency Department
Admission: EM | Admit: 2015-11-18 | Discharge: 2015-11-18 | Disposition: A | Discharge status: Home / Self Care | Payer: Medicaid Other | Attending: Emergency Medicine | Admitting: Emergency Medicine

## 2015-11-18 DIAGNOSIS — F17210 Nicotine dependence, cigarettes, uncomplicated: Secondary | ICD-10-CM | POA: Insufficient documentation

## 2015-11-18 DIAGNOSIS — Z7951 Long term (current) use of inhaled steroids: Secondary | ICD-10-CM | POA: Diagnosis not present

## 2015-11-18 DIAGNOSIS — J441 Chronic obstructive pulmonary disease with (acute) exacerbation: Secondary | ICD-10-CM | POA: Diagnosis not present

## 2015-11-18 DIAGNOSIS — R0602 Shortness of breath: Secondary | ICD-10-CM | POA: Diagnosis present

## 2015-11-18 LAB — CBC WITH DIFFERENTIAL/PLATELET
BASOS ABS: 0.1 10*3/uL (ref 0–0.1)
Basophils Relative: 1 %
EOS ABS: 0.2 10*3/uL (ref 0–0.7)
EOS PCT: 2 %
HCT: 39.9 % (ref 35.0–47.0)
Hemoglobin: 13.6 g/dL (ref 12.0–16.0)
LYMPHS ABS: 2.5 10*3/uL (ref 1.0–3.6)
LYMPHS PCT: 27 %
MCH: 30 pg (ref 26.0–34.0)
MCHC: 34 g/dL (ref 32.0–36.0)
MCV: 88 fL (ref 80.0–100.0)
MONO ABS: 0.7 10*3/uL (ref 0.2–0.9)
Monocytes Relative: 7 %
NEUTROS ABS: 5.9 10*3/uL (ref 1.4–6.5)
NEUTROS PCT: 63 %
PLATELETS: 322 10*3/uL (ref 150–440)
RBC: 4.54 MIL/uL (ref 3.80–5.20)
RDW: 14.8 % — AB (ref 11.5–14.5)
WBC: 9.3 10*3/uL (ref 3.6–11.0)

## 2015-11-18 LAB — COMPREHENSIVE METABOLIC PANEL
ALT: 35 U/L (ref 14–54)
AST: 29 U/L (ref 15–41)
Albumin: 3.5 g/dL (ref 3.5–5.0)
Alkaline Phosphatase: 73 U/L (ref 38–126)
Anion gap: 5 (ref 5–15)
BUN: 16 mg/dL (ref 6–20)
CHLORIDE: 107 mmol/L (ref 101–111)
CO2: 31 mmol/L (ref 22–32)
CREATININE: 0.85 mg/dL (ref 0.44–1.00)
Calcium: 8.1 mg/dL — ABNORMAL LOW (ref 8.9–10.3)
GFR calc non Af Amer: >60 mL/min (ref >60)
Glucose, Bld: 114 mg/dL — ABNORMAL HIGH (ref 65–99)
POTASSIUM: 3.8 mmol/L (ref 3.5–5.1)
SODIUM: 143 mmol/L (ref 135–145)
Total Bilirubin: 1.2 mg/dL (ref 0.3–1.2)
Total Protein: 7 g/dL (ref 6.5–8.1)

## 2015-11-18 LAB — TROPONIN I

## 2015-11-18 MED ORDER — IPRATROPIUM-ALBUTEROL 0.5-2.5 (3) MG/3ML IN SOLN
3.0000 mL | Freq: Once | RESPIRATORY_TRACT | Status: AC
Start: 2015-11-18 — End: 2015-11-18
  Administered 2015-11-18: 3 mL via RESPIRATORY_TRACT
  Filled 2015-11-18: qty 3

## 2015-11-18 MED ORDER — PREDNISONE 20 MG PO TABS
60.0000 mg | ORAL_TABLET | Freq: Once | ORAL | Status: AC
  Administered 2015-11-18: 60 mg via ORAL
  Filled 2015-11-18: qty 3

## 2015-11-18 MED ORDER — PREDNISONE 50 MG PO TABS: 50.0000 mg | ORAL_TABLET | Freq: Every day | ORAL | 0 refills | Status: DC

## 2015-11-18 MED ORDER — IPRATROPIUM-ALBUTEROL 0.5-2.5 (3) MG/3ML IN SOLN
3.0000 mL | Freq: Once | RESPIRATORY_TRACT | Status: AC
  Administered 2015-11-18: 3 mL via RESPIRATORY_TRACT
  Filled 2015-11-18: qty 3

## 2015-11-18 NOTE — ED Provider Notes (Signed)
[facility] Emergency Department Provider Note   ____________________________________________    I have reviewed the triage vital signs and the nursing notes.   HISTORY  Chief Complaint Shortness of Breath     HPI Cheryl Chandler is a 50 y.o. female who presents with complaints with 5 weeks of mild shortness of breath and cough. She reports her PCP has treated her with 2 different courses of antibiotics with little improvement. She does report history of COPD. She stopped smoking 5 weeks ago. She denies chest pain. No fevers or chills. No recent travel. No calf pain or swelling. No nausea vomiting or diaphoresis   Past Medical History:  Diagnosis Date  . Personal history of urinary calculi 11/29/2014    Patient Active Problem List   Diagnosis Date Noted  . Edema 11/30/2014  . Dyspnea on exertion 11/30/2014  . Abdominal pain 11/29/2014  . Anxiety 11/29/2014  . Arthritis 11/29/2014  . Airway hyperreactivity 11/29/2014  . CAFL (chronic airflow limitation) (HCC) 11/29/2014  . Clinical depression 11/29/2014  . H/O eating disorder 11/29/2014  . Esophageal reflux 11/29/2014  . Acne inversa 11/29/2014  . BP (high blood pressure) 11/29/2014  . HLD (hyperlipidemia) 11/29/2014  . Adult hypothyroidism 11/29/2014  . Insomnia 11/29/2014  . LBP (low back pain) 11/29/2014  . Leg numbness 11/29/2014  . Morbid obesity (HCC) 11/29/2014  . Posttraumatic stress disorder 11/29/2014  . Situational stress 11/29/2014  . Compulsive tobacco user syndrome 11/29/2014  . Vitamin D deficiency 11/29/2014  . Tobacco abuse 11/29/2014    Past Surgical History:  Procedure Laterality Date  . APPENDECTOMY    . arm surgery Left   . fatty tumor removed from back    . LEG SURGERY Bilateral   . OVARIAN CYST SURGERY Right    took piece of right ovary due to cyst  . TOTAL HIP ARTHROPLASTY Right     Current Outpatient Rx  . Order #: [id number] Class: Historical Med    . Order #: [id number] Class: Normal  . Order #: [id number] Class: Historical Med  . Order #: [id number] Class: Normal  . Order #: [id number] Class: Normal  . Order #: [id number] Class: Normal  . Order #: [id number] Class: Normal  . Order #: [id number] Class: Normal  . Order #: [id number] Class: Normal  . Order #: [id number] Class: Normal  . Order #: [id number] Class: Normal  . Order #: [id number] Class: Normal  . Order #: [id number] Class: Normal  . Order #: [id number] Class: Normal  . Order #: [id number] Class: Normal  . Order #: [id number] Class: Normal  . Order #: [id number] Class: Print  . Order #: [id number] Class: Normal  . Order #: [id number] Class: Phone In    Allergies Codeine; Sulfa antibiotics; Aspirin; Bee venom; Darvon [propoxyphene]; Latex; and Penicillins  Family History  Problem Relation Age of Onset  . Seizures Sister     disorders    Social History Social History  Substance Use Topics  . Smoking status: Current Every Day Smoker    Packs/day: 0.25    Years: 30.00    Types: Cigarettes  . Smokeless tobacco: Never Used  . Alcohol use No    Review of Systems  Constitutional: No fever/chills Eyes: NNo discharge ENT: No sore throat. Cardiovascular: Denies chest pain. Respiratory: As above Gastrointestinal: No abdominal pain.  No nausea, no vomiting.    Musculoskeletal: Negative for back pain. Skin: Negative for rash. Neurological: Negative for headaches  10-point ROS otherwise negative.  ____________________________________________   PHYSICAL EXAM:  VITAL SIGNS: ED Triage Vitals [11/18/15 1859]  Enc Vitals Group  BP (!) 141/51     Pulse Rate 72     Resp (!) 24     Temp 98.6 F (37 C)     Temp Source Oral     SpO2 96 %     Weight (!) 350 lb (158.8 kg)     Height 5\' 5"  (1.651 m)     Head Circumference      Peak Flow      Pain Score 5     Pain Loc      Pain Edu?      Excl. in GC?     Constitutional: Alert and oriented. No acute distress. Pleasant and  interactive Eyes: Conjunctivae are normal.  Head: Atraumatic.Normocephalic Nose: No congestion/rhinnorhea. Mouth/Throat:   Oropharynx non-erythematous. Neck: No stridor.  Cardiovascular: Normal rate, regular rhythm. Grossly normal heart sounds.  Good peripheral circulation. Respiratory: Normal respiratory effort.  No retractions. Scattered wheezes Gastrointestinal: Soft and nontender. No distention.  No CVA tenderness. Genitourinary: deferred Musculoskeletal: No lower extremity tenderness nor edema.  Warm and well perfused Neurologic:  Normal speech and language. No gross focal neurologic deficits are appreciated.  Skin:  Skin is warm, dry and intact. No rash noted. Psychiatric: Mood and affect are normal. Speech and behavior are normal.  ____________________________________________   LABS (all labs ordered are listed, but only abnormal results are displayed)  Labs Reviewed  CBC WITH DIFFERENTIAL/PLATELET - Abnormal; Notable for the following:       Result Value   RDW 14.8 (*)    All other components within normal limits  COMPREHENSIVE METABOLIC PANEL - Abnormal; Notable for the following:    Glucose, Bld 114 (*)    Calcium 8.1 (*)    All other components within normal limits  TROPONIN I   ____________________________________________  EKG  ED ECG REPORT I, Jene Every, the attending physician, personally viewed and interpreted this ECG.  Date: 11/18/2015 EKG Time: 7:01 PM Rate: 76 Rhythm: normal sinus rhythm QRS Axis: normal Intervals: normal ST/T Wave abnormalities: normal Conduction Disturbances: none Narrative Interpretation: unremarkable  ____________________________________________  RADIOLOGY  Chest x-ray unremarkable ____________________________________________   PROCEDURES  Procedure(s) performed: No    Critical Care performed: No ____________________________________________   INITIAL IMPRESSION / ASSESSMENT AND PLAN / ED COURSE  Pertinent  labs & imaging results that were available during my care of the patient were reviewed by me and considered in my medical decision making (see chart for details).  Patient presents with scattered mild wheezes on exam and shortness of breath for approximately 5 weeks that is mild. Antibiotics have not helped. Her x-ray is unremarkable. Lab work is reassuring. EKG is normal. Not consistent with ACS. We will treat with prednisone by mouth and nebulizers  ----------------------------------------- 8:39 PM on 11/18/2015 -----------------------------------------  Patient feels significant better after nebulizer treatment. We'll discharge with prednisone, she reports she is getting a new nebulizer tomorrow and has albuterol for it. Return precautions discussed ____________________________________________   FINAL CLINICAL IMPRESSION(S) / ED DIAGNOSES  Final diagnoses:  COPD exacerbation (HCC)      NEW MEDICATIONS STARTED DURING THIS VISIT:  New Prescriptions   PREDNISONE (DELTASONE) 50 MG TABLET    Take 1 tablet (50 mg total) by mouth daily with breakfast.     Note:  This document was prepared using Dragon voice recognition software and may include unintentional dictation errors.    Jene Every, MD 11/18/15 2039

## 2015-11-18 NOTE — Telephone Encounter (Signed)
OK to order nebulizer. I think Cheryl Chandler orders them. Can change antibiotic to doxycycline 100mg  twice a day for 10 days.

## 2015-11-18 NOTE — ED Notes (Signed)
Pt states "I've been sick for 5 weeks" Hx of asthma, COPD. States was on z-pack by PCP, states she took all of it but denies getting better. Now on a different antibiotic, Levaquin. States there is 4 pills left, has taken it for 3 days. Voice sounds hoarse. States her nebulizer is messed up and Dr is supposed to be ordering new one. Called dr today for "trouble breathing and they told me to come over here."

## 2015-11-18 NOTE — ED Triage Notes (Signed)
Reports recently taking antibiotic for sob but states "im not any better".  C/o sob and congestion.

## 2015-11-19 MED ORDER — DOXYCYCLINE HYCLATE 100 MG PO TABS: 100.0000 mg | ORAL_TABLET | Freq: Two times a day (BID) | ORAL | 0 refills | Status: DC

## 2015-11-19 NOTE — Telephone Encounter (Signed)
Yes, go ahead and start on doxycycline

## 2015-11-19 NOTE — Telephone Encounter (Signed)
Advised patient as below. Medication was sent into the patient's pharmacy.   Sarah, do I need to put an order in for the patient to get a new nebulizer? Please advise. Thanks!

## 2015-11-19 NOTE — Telephone Encounter (Signed)
Advised patient that we are working on getting her nebulizer ordered. Patient reports that she went to the ER last night due to severe shortness of breath. She reports that she was prescribed prednisone and wants to know if you still want her to take Doxy as well? I have not sent in medication into the pharmacy yet. I wanted to make sure if you wanted patient to continue treatment as planned. Also, should I go ahead and schedule hospital follow up? Please advise. Thanks!

## 2015-11-21 NOTE — Telephone Encounter (Signed)
Usually the doctors have been writing the prescription for nebulizer and faxing to company of choice

## 2015-11-22 ENCOUNTER — Telehealth: Payer: Self-pay | Admitting: Family Medicine

## 2015-11-22 NOTE — Telephone Encounter (Signed)
Please advise 

## 2015-11-22 NOTE — Telephone Encounter (Signed)
Pt calling to thank  Dr & nurse's for there help when she was sick, pt also wanted to follow up to see we have heard anything about her new nebulizer.  Thanks CC

## 2015-11-26 NOTE — Telephone Encounter (Signed)
Can you order a nebulizer for patient from respiratory therapy. She needs it for asthma. Thanks.

## 2015-11-29 ENCOUNTER — Other Ambulatory Visit: Payer: Self-pay | Admitting: Family Medicine

## 2015-11-29 DIAGNOSIS — M542 Cervicalgia: Secondary | ICD-10-CM

## 2015-12-05 NOTE — Telephone Encounter (Signed)
Pt states that she has gotten her nebulizer through [facility]

## 2015-12-20 ENCOUNTER — Institutional Professional Consult (permissible substitution): Payer: Medicaid Other | Admitting: Internal Medicine

## 2015-12-24 ENCOUNTER — Encounter: Payer: Self-pay | Admitting: Family Medicine

## 2015-12-31 ENCOUNTER — Other Ambulatory Visit: Payer: Self-pay | Admitting: *Deleted

## 2015-12-31 MED ORDER — OMEPRAZOLE 20 MG PO CPDR: 20.0000 mg | DELAYED_RELEASE_CAPSULE | Freq: Every day | ORAL | 5 refills | Status: DC

## 2016-01-02 ENCOUNTER — Telehealth: Payer: Self-pay

## 2016-01-02 ENCOUNTER — Encounter: Payer: Self-pay | Admitting: Family Medicine

## 2016-01-02 DIAGNOSIS — R0681 Apnea, not elsewhere classified: Secondary | ICD-10-CM | POA: Insufficient documentation

## 2016-01-02 NOTE — Telephone Encounter (Signed)
Patient advised as directed below. Patient did not want to proceed with split study at this time. Patient states she will call back if she decides to proceed.

## 2016-01-02 NOTE — Telephone Encounter (Signed)
Patient called requesting results from recent Overnight oximetry test she had done on 12/19/2015. The report was scanned into patients chart on 12/24/2015. Please advise.

## 2016-01-02 NOTE — Telephone Encounter (Signed)
She has frequent apnic spells and probably has obstructive sleep apnea. Her oxygen  Levels do not drop low enough to qualify for oxygen. But she does need to get a split night sleep study because she probably needs a CPAP machine.

## 2016-01-21 ENCOUNTER — Institutional Professional Consult (permissible substitution): Payer: Medicaid Other | Admitting: Internal Medicine

## 2016-02-17 ENCOUNTER — Other Ambulatory Visit: Payer: Self-pay | Admitting: Family Medicine

## 2016-02-17 NOTE — Telephone Encounter (Signed)
Please call in tramadol.  

## 2016-02-18 NOTE — Telephone Encounter (Signed)
Rx called in to pharmacy. 

## 2016-03-29 ENCOUNTER — Other Ambulatory Visit: Payer: Self-pay | Admitting: Family Medicine

## 2016-05-03 ENCOUNTER — Other Ambulatory Visit: Payer: Self-pay | Admitting: Family Medicine

## 2016-06-03 ENCOUNTER — Other Ambulatory Visit: Payer: Self-pay | Admitting: Family Medicine

## 2016-07-30 ENCOUNTER — Other Ambulatory Visit: Payer: Self-pay | Admitting: Family Medicine

## 2016-07-30 DIAGNOSIS — M62838 Other muscle spasm: Secondary | ICD-10-CM

## 2016-07-30 DIAGNOSIS — J301 Allergic rhinitis due to pollen: Secondary | ICD-10-CM

## 2016-07-30 DIAGNOSIS — E039 Hypothyroidism, unspecified: Secondary | ICD-10-CM

## 2016-07-30 DIAGNOSIS — M542 Cervicalgia: Secondary | ICD-10-CM

## 2016-07-30 NOTE — Telephone Encounter (Signed)
Prescription called in to pharmacy

## 2016-10-01 ENCOUNTER — Other Ambulatory Visit: Payer: Self-pay | Admitting: Physician Assistant

## 2016-10-01 ENCOUNTER — Other Ambulatory Visit: Payer: Self-pay | Admitting: Family Medicine

## 2016-10-02 NOTE — Telephone Encounter (Signed)
Please call in tramadol.  

## 2016-10-02 NOTE — Telephone Encounter (Signed)
Rx called in to pharmacy. 

## 2016-11-02 ENCOUNTER — Other Ambulatory Visit: Payer: Self-pay | Admitting: Family Medicine

## 2016-12-29 ENCOUNTER — Other Ambulatory Visit: Payer: Self-pay | Admitting: Family Medicine

## 2016-12-29 DIAGNOSIS — R059 Cough, unspecified: Secondary | ICD-10-CM

## 2016-12-29 DIAGNOSIS — R05 Cough: Secondary | ICD-10-CM

## 2016-12-29 DIAGNOSIS — R0602 Shortness of breath: Secondary | ICD-10-CM

## 2016-12-30 ENCOUNTER — Other Ambulatory Visit: Payer: Self-pay | Admitting: Family Medicine

## 2016-12-30 NOTE — Telephone Encounter (Signed)
Pharmacy requesting refills. Thanks!  

## 2017-02-20 ENCOUNTER — Other Ambulatory Visit: Payer: Self-pay | Admitting: Family Medicine

## 2017-04-29 ENCOUNTER — Other Ambulatory Visit: Payer: Self-pay | Admitting: Family Medicine

## 2017-07-10 ENCOUNTER — Emergency Department: Payer: Medicaid Other

## 2017-07-10 ENCOUNTER — Emergency Department
Admission: EM | Admit: 2017-07-10 | Discharge: 2017-07-10 | Disposition: A | Discharge status: Home / Self Care | Payer: Medicaid Other | Attending: Emergency Medicine | Admitting: Emergency Medicine

## 2017-07-10 ENCOUNTER — Encounter: Payer: Self-pay | Admitting: Emergency Medicine

## 2017-07-10 ENCOUNTER — Other Ambulatory Visit: Payer: Self-pay

## 2017-07-10 DIAGNOSIS — Z209 Contact with and (suspected) exposure to unspecified communicable disease: Secondary | ICD-10-CM | POA: Insufficient documentation

## 2017-07-10 DIAGNOSIS — R05 Cough: Secondary | ICD-10-CM | POA: Insufficient documentation

## 2017-07-10 DIAGNOSIS — R079 Chest pain, unspecified: Secondary | ICD-10-CM

## 2017-07-10 DIAGNOSIS — Z91040 Latex allergy status: Secondary | ICD-10-CM | POA: Insufficient documentation

## 2017-07-10 DIAGNOSIS — J441 Chronic obstructive pulmonary disease with (acute) exacerbation: Secondary | ICD-10-CM | POA: Insufficient documentation

## 2017-07-10 DIAGNOSIS — E039 Hypothyroidism, unspecified: Secondary | ICD-10-CM | POA: Diagnosis not present

## 2017-07-10 DIAGNOSIS — Z79899 Other long term (current) drug therapy: Secondary | ICD-10-CM | POA: Insufficient documentation

## 2017-07-10 DIAGNOSIS — R0602 Shortness of breath: Secondary | ICD-10-CM | POA: Diagnosis not present

## 2017-07-10 DIAGNOSIS — F17210 Nicotine dependence, cigarettes, uncomplicated: Secondary | ICD-10-CM | POA: Diagnosis not present

## 2017-07-10 HISTORY — DX: Disorder of thyroid, unspecified: E07.9

## 2017-07-10 HISTORY — DX: Disease of pericardium, unspecified: I31.9

## 2017-07-10 LAB — CBC WITH DIFFERENTIAL/PLATELET
BASOS ABS: 0.1 10*3/uL (ref 0–0.1)
Basophils Relative: 1 %
EOS PCT: 0 %
Eosinophils Absolute: 0 10*3/uL (ref 0–0.7)
HEMATOCRIT: 42.1 % (ref 35.0–47.0)
HEMOGLOBIN: 13.9 g/dL (ref 12.0–16.0)
LYMPHS ABS: 0.4 10*3/uL — AB (ref 1.0–3.6)
LYMPHS PCT: 6 %
MCH: 30.2 pg (ref 26.0–34.0)
MCHC: 33.1 g/dL (ref 32.0–36.0)
MCV: 91.2 fL (ref 80.0–100.0)
Monocytes Absolute: 0.5 10*3/uL (ref 0.2–0.9)
Monocytes Relative: 9 %
NEUTROS ABS: 5.2 10*3/uL (ref 1.4–6.5)
Neutrophils Relative %: 84 %
Platelets: 318 10*3/uL (ref 150–440)
RBC: 4.61 MIL/uL (ref 3.80–5.20)
RDW: 14.5 % (ref 11.5–14.5)
WBC: 6.2 10*3/uL (ref 3.6–11.0)

## 2017-07-10 LAB — BASIC METABOLIC PANEL
ANION GAP: 9 (ref 5–15)
BUN: 12 mg/dL (ref 6–20)
CHLORIDE: 99 mmol/L — AB (ref 101–111)
CO2: 25 mmol/L (ref 22–32)
Calcium: 8.2 mg/dL — ABNORMAL LOW (ref 8.9–10.3)
Creatinine, Ser: 0.88 mg/dL (ref 0.44–1.00)
GFR calc Af Amer: >60 mL/min (ref >60)
GLUCOSE: 110 mg/dL — AB (ref 65–99)
POTASSIUM: 4 mmol/L (ref 3.5–5.1)
Sodium: 133 mmol/L — ABNORMAL LOW (ref 135–145)

## 2017-07-10 LAB — URINALYSIS, COMPLETE (UACMP) WITH MICROSCOPIC
Bacteria, UA: NONE SEEN
Bilirubin Urine: NEGATIVE
GLUCOSE, UA: NEGATIVE mg/dL
Hgb urine dipstick: NEGATIVE
KETONES UR: NEGATIVE mg/dL
Leukocytes, UA: NEGATIVE
NITRITE: NEGATIVE
Protein, ur: NEGATIVE mg/dL
Specific Gravity, Urine: 1.021 (ref 1.005–1.030)
pH: 6 (ref 5.0–8.0)

## 2017-07-10 LAB — TROPONIN I: Troponin I: <0.03 ng/mL (ref <0.03)

## 2017-07-10 MED ORDER — KETOROLAC TROMETHAMINE 60 MG/2ML IM SOLN
60.0000 mg | Freq: Once | INTRAMUSCULAR | Status: AC
  Administered 2017-07-10: 60 mg via INTRAMUSCULAR
  Filled 2017-07-10: qty 2

## 2017-07-10 MED ORDER — PREDNISONE 20 MG PO TABS
60.0000 mg | ORAL_TABLET | Freq: Once | ORAL | Status: AC
  Administered 2017-07-10: 60 mg via ORAL
  Filled 2017-07-10: qty 3

## 2017-07-10 MED ORDER — IPRATROPIUM-ALBUTEROL 0.5-2.5 (3) MG/3ML IN SOLN
3.0000 mL | Freq: Once | RESPIRATORY_TRACT | Status: AC
  Administered 2017-07-10: 3 mL via RESPIRATORY_TRACT
  Filled 2017-07-10: qty 3

## 2017-07-10 MED ORDER — PREDNISONE 20 MG PO TABS: 40.0000 mg | ORAL_TABLET | Freq: Every day | ORAL | 0 refills | Status: DC

## 2017-07-10 NOTE — Discharge Instructions (Signed)
Please seek medical attention for any high fevers, chest pain, shortness of breath, change in behavior, persistent vomiting, bloody stool or any other new or concerning symptoms.  

## 2017-07-10 NOTE — ED Triage Notes (Addendum)
Pt arrived via EMS from home with reports of chest pain and shortness of breath, pt states she has had sick contacts.  Per EMS EKG was unremarkable. Pt voice is hoarse on arrival. Pt states she has been having achiness and low grade temps. Pt states she has fluid around her heart as well. Pt states she has chest pain to palpation and chest pain when moving.

## 2017-07-10 NOTE — ED Provider Notes (Signed)
[facility] Emergency Department Provider Note  ____________________________________________   I have reviewed the triage vital signs and the nursing notes.   HISTORY  Chief Complaint Chest Pain and Shortness of Breath   History limited by: Not Limited   HPI Cheryl Chandler is a 52 y.o. female who presents to the emergency department today with primary complaint of chest pain. Located in the central chest. It is sharp. Started yesterday. Initially intermittent but became constant today. Worse with movement, palpation and deep breaths. She does have history of asthma and has been using her inhaler at home without any significant relief. She has had multiple recent sick contacts. Has had associated non productive cough.  No fevers.  Per medical record review patient has a history of anxiety, apnea, airway hyperreactivity.  Past Medical History:  Diagnosis Date  . Personal history of urinary calculi 11/29/2014    Patient Active Problem List   Diagnosis Date Noted  . Apnea 01/02/2016  . Edema 11/30/2014  . Dyspnea on exertion 11/30/2014  . Abdominal pain 11/29/2014  . Anxiety 11/29/2014  . Arthritis 11/29/2014  . Airway hyperreactivity 11/29/2014  . CAFL (chronic airflow limitation) (HCC) 11/29/2014  . Clinical depression 11/29/2014  . H/O eating disorder 11/29/2014  . Esophageal reflux 11/29/2014  . Acne inversa 11/29/2014  . BP (high blood pressure) 11/29/2014  . HLD (hyperlipidemia) 11/29/2014  . Adult hypothyroidism 11/29/2014  . Insomnia 11/29/2014  . LBP (low back pain) 11/29/2014  . Leg numbness 11/29/2014  . Morbid obesity (HCC) 11/29/2014  . Posttraumatic stress disorder 11/29/2014  . Situational stress 11/29/2014  . Compulsive tobacco user syndrome 11/29/2014  . Vitamin D deficiency 11/29/2014  . Tobacco abuse 11/29/2014    Past Surgical History:  Procedure Laterality Date  . APPENDECTOMY    . arm surgery Left   . fatty tumor  removed from back    . LEG SURGERY Bilateral   . OVARIAN CYST SURGERY Right    took piece of right ovary due to cyst  . TOTAL HIP ARTHROPLASTY Right     Prior to Admission medications   Medication Sig Start Date End Date Taking? Authorizing Provider  albuterol (PROVENTIL) (2.5 MG/3ML) 0.083% nebulizer solution inhale contents of 1 vial in nebulizer every 4 hours if needed for wheezing and shortness of breath 12/30/16   Malva LimesFisher, Donald E, MD  Cholecalciferol (VITAMIN D3) 2000 UNITS TABS Take by mouth.    [provider]  citalopram (CELEXA) 20 MG tablet take 1 tablet by mouth once daily 02/21/17   Malva LimesFisher, Donald E, MD  doxycycline (VIBRA-TABS) 100 MG tablet Take 1 tablet (100 mg total) by mouth 2 (two) times daily. 11/19/15   Malva LimesFisher, Donald E, MD  DULERA 100-5 MCG/ACT AERO inhale 2 puffs by mouth twice a day 06/03/16   Malva LimesFisher, Donald E, MD  EPIPEN 2-PAK 0.3 MG/0.3ML SOAJ injection use as directed by prescriber 06/03/16   Malva LimesFisher, Donald E, MD  furosemide (LASIX) 20 MG tablet take 1 tablet by mouth once daily 02/21/17   Malva LimesFisher, Donald E, MD  gabapentin (NEURONTIN) 100 MG capsule take 1-2 capsules by mouth at bedtime 07/30/16   Margaretann LovelessBurnette, Jennifer M, PA-C  levofloxacin (LEVAQUIN) 500 MG tablet Take 1 tablet (500 mg total) by mouth daily. 11/15/15   Malva LimesFisher, Donald E, MD  levothyroxine (SYNTHROID, LEVOTHROID) 50 MCG tablet take 1 tablet by mouth once daily 07/30/16   Margaretann LovelessBurnette, Jennifer M, PA-C  lisinopril (PRINIVIL,ZESTRIL) 20 MG tablet take 1 tablet by mouth once  daily 11/02/16   Malva Limes, MD  meloxicam (MOBIC) 15 MG tablet take 1 tablet by mouth once daily with food or milk if needed 04/29/17   Malva Limes, MD  methocarbamol (ROBAXIN) 750 MG tablet take 1-2 tablets by mouth three times a day if needed for back pain 07/30/16   Joycelyn Man M, PA-C  montelukast (SINGULAIR) 10 MG tablet take 1 tablet by mouth once daily 07/30/16   Margaretann Loveless, PA-C  nitroGLYCERIN (NITROSTAT) 0.4 MG SL  tablet place 1 tablet under the tongue if needed every 5 minutes for chest pain 12/30/16   Malva Limes, MD  omeprazole (PRILOSEC) 20 MG capsule take 1 capsule by mouth once daily 10/01/16   Malva Limes, MD  predniSONE (DELTASONE) 50 MG tablet Take 1 tablet (50 mg total) by mouth daily with breakfast. 11/18/15   Jene Every, MD  PROAIR HFA 108 [phone] Base) MCG/ACT inhaler inhale 2 puffs by mouth every 4 to 6 hours if needed for wheezing 02/21/17   Malva Limes, MD  simvastatin (ZOCOR) 40 MG tablet take 1 tablet by mouth once daily 11/02/16   Malva Limes, MD  traMADol Janean Sark) 50 MG tablet take 1 tablet by mouth every 8 hours if needed for pain 04/29/17   Malva Limes, MD    Allergies Codeine; Sulfa antibiotics; Aspirin; Bee venom; Darvon [propoxyphene]; Latex; and Penicillins  Family History  Problem Relation Age of Onset  . Seizures Sister        disorders    Social History Social History   Tobacco Use  . Smoking status: Current Every Day Smoker    Packs/day: 0.25    Years: 30.00    Pack years: 7.50    Types: Cigarettes  . Smokeless tobacco: Never Used  Substance Use Topics  . Alcohol use: No    Alcohol/week: 0.0 oz  . Drug use: No    Review of Systems Constitutional: No fever/chills Eyes: No visual changes. ENT: No sore throat. Cardiovascular: Positive chest pain. Respiratory: Positiveshortness of breath. Gastrointestinal: No abdominal pain.  No nausea, no vomiting.  No diarrhea.   Genitourinary: Negative for dysuria. Musculoskeletal: Negative for back pain. Skin: Negative for rash. Neurological: Negative for headaches, focal weakness or numbness.  ____________________________________________   PHYSICAL EXAM:  VITAL SIGNS: ED Triage Vitals [07/10/17 1604]  Enc Vitals Group     BP 159/49     Pulse 79     Resp 24     Temp 98.6     Temp src      SpO2 99 %    Constitutional: Alert and oriented. Well appearing and in no distress. Eyes:  Conjunctivae are normal.  ENT   Head: Normocephalic and atraumatic.   Nose: No congestion/rhinnorhea.   Mouth/Throat: Mucous membranes are moist.   Neck: No stridor. Hematological/Lymphatic/Immunilogical: No cervical lymphadenopathy. Cardiovascular: Normal rate, regular rhythm.  No murmurs, rubs, or gallops. Respiratory: Normal respiratory effort without tachypnea nor retractions. Breath sounds are clear and equal bilaterally. No wheezes/rales/rhonchi. Gastrointestinal: Soft and non tender. No rebound. No guarding.  Genitourinary: Deferred Musculoskeletal: Normal range of motion in all extremities. No lower extremity edema. Neurologic:  Normal speech and language. No gross focal neurologic deficits are appreciated.  Skin:  Skin is warm, dry and intact. No rash noted. Psychiatric: Mood and affect are normal. Speech and behavior are normal. Patient exhibits appropriate insight and judgment.  ____________________________________________    LABS (pertinent positives/negatives)  Trop <0.03 CBC wnl except  lymph # 0.4 BMP na 133, k 4.0, glu 110  ____________________________________________   EKG  I, Phineas Semen, attending physician, personally viewed and interpreted this EKG  EKG Time: 1612 Rate: 82 Rhythm: normal sinus rhythm Axis: normal Intervals: qtc 541 QRS: narrow, q waves v1, v2 ST changes: no st elevation Impression: abnormal ekg  ____________________________________________    RADIOLOGY  None  ____________________________________________   PROCEDURES  Procedures  ____________________________________________   INITIAL IMPRESSION / ASSESSMENT AND PLAN / ED COURSE  Pertinent labs & imaging results that were available during my care of the patient were reviewed by me and considered in my medical decision making (see chart for details).  Patient presented to the emergency department today because of concerns for chest pain that started  yesterday.  Differential would be broad.  Would consider ACS.  Troponin was negative I would expect some elevation if had been going on since yesterday.  Other considerations would be pneumonia or pneumothorax however x-ray was negative.  Blood work does not show signs consistent with infection.  Patient did feel better after DuoNeb treatments begin COPD exacerbation potential etiology of the patient's symptoms.  Will give patient steroids and prescription for further steroids.  Discussed findings plan with patient.  ____________________________________________   FINAL CLINICAL IMPRESSION(S) / ED DIAGNOSES  Final diagnoses:  COPD exacerbation (HCC)  Nonspecific chest pain     Note: This dictation was prepared with Dragon dictation. Any transcriptional errors that result from this process are unintentional  d   Phineas Semen, MD 07/10/17 2257

## 2017-07-10 NOTE — ED Notes (Signed)
Attempted IV access x 1, able to get blood, but unable to secure as an IV.  Pt currently has no orders for IV medications or procedures, will try again if needed.

## 2017-07-10 NOTE — ED Notes (Signed)
Patient transported to X-ray 

## 2017-07-12 ENCOUNTER — Other Ambulatory Visit: Payer: Self-pay | Admitting: Family Medicine

## 2017-07-13 ENCOUNTER — Other Ambulatory Visit: Payer: Self-pay | Admitting: Family Medicine

## 2017-07-13 DIAGNOSIS — E039 Hypothyroidism, unspecified: Secondary | ICD-10-CM

## 2017-07-13 DIAGNOSIS — J301 Allergic rhinitis due to pollen: Secondary | ICD-10-CM

## 2017-07-13 NOTE — Telephone Encounter (Signed)
[facility] is requesting refills on the following medications  levothyroxine (SYNTHROID, LEVOTHROID) 50 MCG tablet  montelukast (SINGULAIR) 10 MG tablet  furosemide (LASIX) 20 MG tablet  EPIPEN 2-PAK 0.3 MG/0.3ML SOAJ injection

## 2017-07-14 ENCOUNTER — Inpatient Hospital Stay: Payer: Self-pay | Admitting: Family Medicine

## 2017-07-14 MED ORDER — EPINEPHRINE 0.3 MG/0.3ML IJ SOAJ: INTRAMUSCULAR | 1 refills | Status: DC

## 2017-07-14 MED ORDER — MONTELUKAST SODIUM 10 MG PO TABS: 10.0000 mg | ORAL_TABLET | Freq: Every day | ORAL | 5 refills | Status: DC

## 2017-07-14 MED ORDER — FUROSEMIDE 20 MG PO TABS: 20.0000 mg | ORAL_TABLET | Freq: Every day | ORAL | 1 refills | Status: DC

## 2017-07-14 MED ORDER — LEVOTHYROXINE SODIUM 50 MCG PO TABS: 50.0000 ug | ORAL_TABLET | Freq: Every day | ORAL | 5 refills | Status: DC

## 2017-07-17 ENCOUNTER — Other Ambulatory Visit: Payer: Self-pay | Admitting: Family Medicine

## 2017-07-19 ENCOUNTER — Telehealth: Payer: Self-pay | Admitting: Family Medicine

## 2017-07-19 DIAGNOSIS — M62838 Other muscle spasm: Secondary | ICD-10-CM

## 2017-07-19 DIAGNOSIS — M542 Cervicalgia: Secondary | ICD-10-CM

## 2017-07-19 MED ORDER — GABAPENTIN 100 MG PO CAPS: ORAL_CAPSULE | ORAL | 5 refills | Status: DC

## 2017-07-19 MED ORDER — METHOCARBAMOL 750 MG PO TABS: ORAL_TABLET | ORAL | 5 refills | Status: DC

## 2017-07-19 NOTE — Telephone Encounter (Signed)
[facility] is requesting refills on patient gabapentin (NEURONTIN) 100 MG capsule   methocarbamol (ROBAXIN) 750 MG tablet

## 2017-10-08 ENCOUNTER — Encounter: Payer: Self-pay | Admitting: Emergency Medicine

## 2017-10-08 ENCOUNTER — Emergency Department
Admission: EM | Admit: 2017-10-08 | Discharge: 2017-10-08 | Disposition: A | Discharge status: Home / Self Care | Payer: Medicaid Other | Attending: Emergency Medicine | Admitting: Emergency Medicine

## 2017-10-08 ENCOUNTER — Other Ambulatory Visit: Payer: Self-pay

## 2017-10-08 DIAGNOSIS — Z91040 Latex allergy status: Secondary | ICD-10-CM | POA: Insufficient documentation

## 2017-10-08 DIAGNOSIS — K0889 Other specified disorders of teeth and supporting structures: Secondary | ICD-10-CM | POA: Diagnosis present

## 2017-10-08 DIAGNOSIS — F17210 Nicotine dependence, cigarettes, uncomplicated: Secondary | ICD-10-CM | POA: Insufficient documentation

## 2017-10-08 DIAGNOSIS — J45909 Unspecified asthma, uncomplicated: Secondary | ICD-10-CM | POA: Diagnosis not present

## 2017-10-08 DIAGNOSIS — I10 Essential (primary) hypertension: Secondary | ICD-10-CM | POA: Diagnosis not present

## 2017-10-08 DIAGNOSIS — Z96641 Presence of right artificial hip joint: Secondary | ICD-10-CM | POA: Insufficient documentation

## 2017-10-08 DIAGNOSIS — Z79899 Other long term (current) drug therapy: Secondary | ICD-10-CM | POA: Insufficient documentation

## 2017-10-08 DIAGNOSIS — K047 Periapical abscess without sinus: Secondary | ICD-10-CM | POA: Diagnosis not present

## 2017-10-08 DIAGNOSIS — E039 Hypothyroidism, unspecified: Secondary | ICD-10-CM | POA: Insufficient documentation

## 2017-10-08 MED ORDER — LIDOCAINE VISCOUS HCL 2 % MT SOLN: 10.0000 mL | OROMUCOSAL | 0 refills | Status: DC | PRN

## 2017-10-08 MED ORDER — CLINDAMYCIN HCL 300 MG PO CAPS: 300.0000 mg | ORAL_CAPSULE | Freq: Three times a day (TID) | ORAL | 0 refills | Status: AC

## 2017-10-08 MED ORDER — CLINDAMYCIN HCL 150 MG PO CAPS
300.0000 mg | ORAL_CAPSULE | Freq: Once | ORAL | Status: AC
  Administered 2017-10-08: 300 mg via ORAL
  Filled 2017-10-08: qty 2

## 2017-10-08 NOTE — ED Notes (Signed)
Pt verbalized understanding of discharge instructions. NAD at this time. 

## 2017-10-08 NOTE — ED Triage Notes (Signed)
States she thinks she has a dental abscess  Unsure if she lost a filling  Right gumline is swollen

## 2017-10-08 NOTE — ED Provider Notes (Signed)
[facility] Emergency Department Provider Note  ____________________________________________  Time seen: Approximately 4:28 PM  I have reviewed the triage vital signs and the nursing notes.   HISTORY  Chief Complaint Dental Pain    HPI Cheryl Chandler is a 52 y.o. female that presents to the emergency department for evaluation of right bottom tooth pain for 3 days.  Pain worsened today.  Patient states that she has Medicaid and has not been able to schedule an appoint with a dentist because she cannot afford it.  She also cannot go out of town to see a dentist because her husband has an ankle bracelet on.  She has not checked her temperature but has felt warm.  No alleviating measures have been attempted.  No drainage.  Past Medical History:  Diagnosis Date  . Asthma   . COPD (chronic obstructive pulmonary disease) (HCC)   . Hypertension   . Pericarditis   . Personal history of urinary calculi 11/29/2014  . Thyroid disease     Patient Active Problem List   Diagnosis Date Noted  . Apnea 01/02/2016  . Edema 11/30/2014  . Dyspnea on exertion 11/30/2014  . Abdominal pain 11/29/2014  . Anxiety 11/29/2014  . Arthritis 11/29/2014  . Airway hyperreactivity 11/29/2014  . CAFL (chronic airflow limitation) (HCC) 11/29/2014  . Clinical depression 11/29/2014  . H/O eating disorder 11/29/2014  . Esophageal reflux 11/29/2014  . Acne inversa 11/29/2014  . BP (high blood pressure) 11/29/2014  . HLD (hyperlipidemia) 11/29/2014  . Adult hypothyroidism 11/29/2014  . Insomnia 11/29/2014  . LBP (low back pain) 11/29/2014  . Leg numbness 11/29/2014  . Morbid obesity (HCC) 11/29/2014  . Posttraumatic stress disorder 11/29/2014  . Situational stress 11/29/2014  . Compulsive tobacco user syndrome 11/29/2014  . Vitamin D deficiency 11/29/2014  . Tobacco abuse 11/29/2014    Past Surgical History:  Procedure Laterality Date  . APPENDECTOMY    . arm surgery  Left   . fatty tumor removed from back    . LEG SURGERY Bilateral   . OVARIAN CYST SURGERY Right    took piece of right ovary due to cyst  . TOTAL HIP ARTHROPLASTY Right     Prior to Admission medications   Medication Sig Start Date End Date Taking? Authorizing Provider  albuterol (PROVENTIL) (2.5 MG/3ML) 0.083% nebulizer solution inhale contents of 1 vial in nebulizer every 4 hours if needed for wheezing and shortness of breath 12/30/16   Malva LimesFisher, Donald E, MD  Cholecalciferol (VITAMIN D3) 2000 UNITS TABS Take 1 tablet by mouth daily.     [provider]  citalopram (CELEXA) 20 MG tablet take 1 tablet by mouth once daily 02/21/17   Malva LimesFisher, Donald E, MD  clindamycin (CLEOCIN) 300 MG capsule Take 1 capsule (300 mg total) by mouth 3 (three) times daily for 10 days. 10/08/17 10/18/17  Enid DerryWagner, Xitlaly Ault, PA-C  DULERA 100-5 MCG/ACT AERO INHALE 2 PUFFS BY MOUTH TWICE A DAY. RINSE MOUTH AFTER USE. 07/12/17   Malva LimesFisher, Donald E, MD  EPINEPHrine (EPIPEN 2-PAK) 0.3 mg/0.3 mL IJ SOAJ injection use as directed by prescriber 07/14/17   Malva LimesFisher, Donald E, MD  furosemide (LASIX) 20 MG tablet Take 1 tablet (20 mg total) by mouth daily. 07/14/17   Malva LimesFisher, Donald E, MD  gabapentin (NEURONTIN) 100 MG capsule take 1-2 capsules by mouth at bedtime 07/19/17   Malva LimesFisher, Donald E, MD  levothyroxine (SYNTHROID, LEVOTHROID) 50 MCG tablet Take 1 tablet (50 mcg total) by mouth daily. 07/14/17  Malva Limes, MD  lidocaine (XYLOCAINE) 2 % solution Use as directed 10 mLs in the mouth or throat as needed for mouth pain. 10/08/17   Enid Derry, PA-C  lisinopril (PRINIVIL,ZESTRIL) 20 MG tablet take 1 tablet by mouth once daily 11/02/16   Malva Limes, MD  meloxicam [facility]) 15 MG tablet take 1 tablet by mouth once daily with food or milk if needed 04/29/17   Malva Limes, MD  methocarbamol (ROBAXIN) 750 MG tablet take 1-2 tablets by mouth three times a day if needed for back pain 07/19/17   Malva Limes, MD  montelukast  (SINGULAIR) 10 MG tablet Take 1 tablet (10 mg total) by mouth daily. 07/14/17   Malva Limes, MD  nitroGLYCERIN (NITROSTAT) 0.4 MG SL tablet DISSOLVE 1 TABLET UNDER TONGUE AT ONSET OF CHEST PAIN. REPEAT IN 5 MIN IF NOT RESOLVED, MAX 3 DOSES. 911 IF NEEDED. 07/18/17   Malva Limes, MD  omeprazole (PRILOSEC) 20 MG capsule take 1 capsule by mouth once daily 10/01/16   Malva Limes, MD  predniSONE (DELTASONE) 20 MG tablet Take 2 tablets (40 mg total) by mouth daily. 07/10/17   Phineas Semen, MD  predniSONE (DELTASONE) 50 MG tablet Take 1 tablet (50 mg total) by mouth daily with breakfast. Patient not taking: Reported on 07/10/2017 11/18/15   Jene Every, MD  [facility] 108 [phone] Base) MCG/ACT inhaler inhale 2 puffs by mouth every 4 to 6 hours if needed for wheezing 02/21/17   Malva Limes, MD  simvastatin (ZOCOR) 40 MG tablet take 1 tablet by mouth once daily 11/02/16   Malva Limes, MD  traMADol Janean Sark) 50 MG tablet take 1 tablet by mouth every 8 hours if needed for pain 04/29/17   Malva Limes, MD    Allergies Codeine; Sulfa antibiotics; Aspirin; Bee venom; Darvon [propoxyphene]; Latex; Oxycodone; and Penicillins  Family History  Problem Relation Age of Onset  . Seizures Sister        disorders    Social History Social History   Tobacco Use  . Smoking status: Current Every Day Smoker    Packs/day: 0.25    Years: 30.00    Pack years: 7.50    Types: Cigarettes  . Smokeless tobacco: Never Used  Substance Use Topics  . Alcohol use: No    Alcohol/week: 0.0 oz  . Drug use: No     Review of Systems  Constitutional: See HPI Gastrointestinal: No abdominal pain.  Musculoskeletal: Negative for musculoskeletal pain. Skin: Negative for rash, abrasions, lacerations, ecchymosis. Neurological: Negative for headaches   ____________________________________________   PHYSICAL EXAM:  VITAL SIGNS: ED Triage Vitals  Enc Vitals Group     BP 10/08/17 1521 (!) 144/57      Pulse Rate 10/08/17 1521 87     Resp 10/08/17 1521 18     Temp 10/08/17 1521 98.4 F (36.9 C)     Temp Source 10/08/17 1521 Oral     SpO2 10/08/17 1521 95 %     Weight 10/08/17 1525 (!) 320 lb (145.2 kg)     Height 10/08/17 1525 5\' 5"  (1.651 m)     Head Circumference --      Peak Flow --      Pain Score 10/08/17 1525 9     Pain Loc --      Pain Edu? --      Excl. in GC? --      Constitutional: Alert and oriented. Well appearing and  in no acute distress. Eyes: Conjunctivae are normal. PERRL. EOMI. Head: Atraumatic. ENT:      Ears:      Nose: No congestion/rhinnorhea.      Mouth/Throat: Mucous membranes are moist. Poor dentition.  Tenderness to palpation over bottom right molar.  Several fillings. Swelling to right cheek.  No difficulty opening and closing mouth. Neck: No stridor.   Cardiovascular: Normal rate, regular rhythm.  Good peripheral circulation. Respiratory: Normal respiratory effort without tachypnea or retractions. Lungs CTAB. Good air entry to the bases with no decreased or absent breath sounds.   Musculoskeletal: Full range of motion to all extremities. No gross deformities appreciated. Neurologic:  Normal speech and language. No gross focal neurologic deficits are appreciated.  Skin:  Skin is warm, dry and intact. No rash noted. Psychiatric: Mood and affect are normal. Speech and behavior are normal. Patient exhibits appropriate insight and judgement.   ____________________________________________   LABS (all labs ordered are listed, but only abnormal results are displayed)  Labs Reviewed - No data to display ____________________________________________  EKG   ____________________________________________  RADIOLOGY  No results found.  ____________________________________________    PROCEDURES  Procedure(s) performed:    Procedures    Medications  clindamycin (CLEOCIN) capsule 300 mg (300 mg Oral Given 10/08/17 1711)      ____________________________________________   INITIAL IMPRESSION / ASSESSMENT AND PLAN / ED COURSE  Pertinent labs & imaging results that were available during my care of the patient were reviewed by me and considered in my medical decision making (see chart for details).  Review of the [place] CSRS was performed in accordance of the [facility] prior to dispensing any controlled drugs.     Patient's diagnosis is consistent with dental abscess. Vital signs and exam are reassuring. Patient will be discharged home with prescriptions for clindamycin. Patient is to follow up with dentist as directed. Resources were provided. Patient is given ED precautions to return to the ED for any worsening or new symptoms.     ____________________________________________  FINAL CLINICAL IMPRESSION(S) / ED DIAGNOSES  Final diagnoses:  Dental abscess      NEW MEDICATIONS STARTED DURING THIS VISIT:  ED Discharge Orders        Ordered    clindamycin (CLEOCIN) 300 MG capsule  3 times daily     10/08/17 1703    lidocaine (XYLOCAINE) 2 % solution  As needed     10/08/17 1708          This chart was dictated using voice recognition software/Dragon. Despite best efforts to proofread, errors can occur which can change the meaning. Any change was purely unintentional.    Enid Derry, PA-C 10/08/17 1846    Sharman Cheek, MD 10/09/17 1555

## 2017-10-08 NOTE — Discharge Instructions (Addendum)
OPTIONS FOR DENTAL FOLLOW UP CARE ° °[place] Department of Health and Human Services - Local Safety Net Dental Clinics °http://www.ncdhhs.gov/dph/oralhealth/services/safetynetclinics.htm °  °[facility] (336-562-3123) ° °[facility] [place] ([phone]) ° °[facility] ([phone] ext [phone]) ° °[facility] ([phone]) ° °[facility] ([phone]) °This clinic caters to the indigent population and is on a lottery system. °Location: °[facility], [address], [place] °Clinic Hours: °Wednesdays from 6pm - 9pm, patients seen by a lottery system. °For dates, call or go to [email or web address]/[facility]/[email or web address] °Services: °Cleanings, fillings and simple extractions. °Payment Options: °DENTAL WORK IS FREE OF CHARGE. Bring proof of income or support. °Best way to get seen: °Arrive at 5:15 pm - this is a lottery, NOT first come/first serve, so arriving earlier will not increase your chances of being seen. °  °  °[facility] °[phone] °Select option 1 for emergencies °  °Location: °[facility], [address], [place] °Clinic Hours: °No walk-ins accepted - call the day before to schedule an appointment. °Check in times are 9:30 am and 1:30 pm. °Services: °Simple extractions, temporary fillings, pulpectomy/pulp debridement, uncomplicated abscess drainage. °Payment Options: °PAYMENT IS DUE AT THE TIME OF SERVICE.  Fee is usually $100-200, additional surgical procedures (e.g. abscess drainage) may be extra. °Cash, checks, Visa/MasterCard accepted.  Can file Medicaid if patient is covered for dental - patient should call case worker to check. °No discount for [facility] patients. °Best way to get seen: °MUST call the day before and get onto the schedule. Can usually be seen the next 1-2 days. No walk-ins accepted. °  °  °[facility] °[phone] °   °Location: °[facility], [address], [place] °Clinic Hours: °M, W, Th, F 8am or 1:30pm, Tues 9a or 1:30 - first come/first served. °Services: °Simple extractions, temporary fillings, uncomplicated abscess drainage.  You do not need to be an [place] resident. °Payment Options: °PAYMENT IS DUE AT THE TIME OF SERVICE. °Dental insurance, otherwise sliding scale - bring proof of income or support. °Depending on income and treatment needed, cost is usually $50-200. °Best way to get seen: °Arrive early as it is first come/first served. °  °  °[facility] °[phone] °  °Location: °[address] °Clinic Hours: °Mon-Thu 8a-5p °Services: °Most basic dental services including extractions and fillings. °Payment Options: °PAYMENT IS DUE AT THE TIME OF SERVICE. °Sliding scale, up to 50% off - bring proof if income or support. °Medicaid with dental option accepted. °Best way to get seen: °Call to schedule an appointment, can usually be seen within 2 weeks OR they will try to see walk-ins - show up at 8a or 2p (you may have to wait). °  °  °[facility] °[phone] °[place] RESIDENTS ONLY °  °Location: °[facility], [address], [place], [place] [address] °Clinic Hours: By appointment only. °Monday - Thursday 8am-5pm, Friday 8am-12pm °Services: Cleanings, fillings, extractions. °Payment Options: °PAYMENT IS DUE AT THE TIME OF SERVICE. °Cash, Visa or MasterCard. Sliding scale - $30 minimum per service. °Best way to get seen: °Come in to office, complete packet and make an appointment - need proof of income °or support monies for each household member and proof of [place] residence. °Usually takes about a month to get in. °  °  °[facility] °[phone] °  °Location: °[address]   [place] °Clinic Hours: Walk-in Urgent Care Dental Services are offered Monday-Friday  mornings only. °The numbers of emergencies accepted daily is limited to the number of °providers available. °Maximum 15 - Mondays, Wednesdays & Thursdays °Maximum 10 - Tuesdays & Fridays °Services: °You do not need to be a [place] resident to be seen for a dental emergency. °Emergencies are defined as pain, swelling, abnormal bleeding, or dental trauma. Walkins will receive x-rays if needed. °NOTE: Dental cleaning is not an emergency. °Payment Options: °PAYMENT IS DUE AT THE TIME OF SERVICE. °Minimum co-pay is $40.00 for uninsured patients. °Minimum co-pay is $3.00 for Medicaid with dental coverage. °Dental Insurance is accepted and must be presented at time of visit. °Medicare does not cover dental. °Forms of payment: Cash, credit card, checks. °Best way to get seen: °If not previously registered with the clinic, walk-in dental registration begins at 7:15 am and is on a first come/first serve basis. °If previously registered with the clinic, call to make an appointment. °  °  °[facility] °[phone] °[place] RESIDENTS ONLY °  °Location: °[address], [place], [place] °Clinic Hours: °Mon-Thu 10a-2p °Services: Extractions only! °Payment Options: °FREE (donations accepted) - bring proof of income or support °Best way to get seen: °Call and schedule an appointment OR come at 8am on the 1st Monday of every month (except for holidays) when it is first come/first served. °  °  °[facility] °[phone] °  °Location: °[address], [place] °Clinic Hours: °Friday mornings °Services, Payment Options, Best way to get seen: °Call for info °

## 2017-10-20 ENCOUNTER — Telehealth: Payer: Self-pay | Admitting: Family Medicine

## 2017-10-20 NOTE — Telephone Encounter (Signed)
Pt requesting a refill of clindamycin HCL 300 MG and Lidocaine 2 %.  States she was giving the Clindamycin in the hospital.  States she has an tooth abscess  and will not be able to get tooth pulled until infection is cleared up.    [facility]

## 2017-10-21 ENCOUNTER — Telehealth: Payer: Self-pay | Admitting: Family Medicine

## 2017-10-21 ENCOUNTER — Other Ambulatory Visit: Payer: Self-pay | Admitting: Family Medicine

## 2017-10-21 ENCOUNTER — Ambulatory Visit: Payer: Self-pay | Admitting: Family Medicine

## 2017-10-21 MED ORDER — CLINDAMYCIN HCL 300 MG PO CAPS: 300.0000 mg | ORAL_CAPSULE | Freq: Three times a day (TID) | ORAL | 0 refills | Status: DC

## 2017-10-21 MED ORDER — LIDOCAINE VISCOUS HCL 2 % MT SOLN: 10.0000 mL | OROMUCOSAL | 0 refills | Status: DC | PRN

## 2017-10-21 NOTE — Telephone Encounter (Signed)
See contact comments.

## 2017-11-06 ENCOUNTER — Other Ambulatory Visit: Payer: Self-pay | Admitting: Family Medicine

## 2017-12-09 ENCOUNTER — Other Ambulatory Visit: Payer: Self-pay | Admitting: Family Medicine

## 2017-12-28 ENCOUNTER — Other Ambulatory Visit: Payer: Self-pay | Admitting: Family Medicine

## 2018-04-02 ENCOUNTER — Other Ambulatory Visit: Payer: Self-pay | Admitting: Family Medicine

## 2018-06-29 ENCOUNTER — Other Ambulatory Visit: Payer: Self-pay | Admitting: Family Medicine

## 2018-07-01 ENCOUNTER — Telehealth: Payer: Self-pay

## 2018-07-01 NOTE — Telephone Encounter (Signed)
Patient has called office requesting to re-establish as a patient. Patient was previously seen by Dr. Sherrie Mustache who is listed as PCP. Patient states that she has issues with Dr. Sherrie Mustache and does not want to see him again, patient is wanting to know if you can take her on as your patient? Please review chart and advise. Patient requested to schedule physical with you sometime after 07/29/18. Patient can be reached at [phone] or her husbands cell which is [phone]. KW

## 2018-07-01 NOTE — Telephone Encounter (Signed)
Patient advised.

## 2018-07-01 NOTE — Telephone Encounter (Signed)
Patient can be scheduled in a new patient slot with me, but will need to just find first available which may be in July.

## 2018-07-01 NOTE — Telephone Encounter (Signed)
Upon further review, it is clinic policy to not to allow switching amongst providers within the clinic

## 2018-08-01 ENCOUNTER — Ambulatory Visit: Payer: Medicaid Other | Admitting: Family Medicine

## 2018-08-07 ENCOUNTER — Emergency Department: Payer: Medicaid Other

## 2018-08-07 ENCOUNTER — Encounter: Payer: Self-pay | Admitting: Emergency Medicine

## 2018-08-07 ENCOUNTER — Other Ambulatory Visit: Payer: Self-pay

## 2018-08-07 ENCOUNTER — Emergency Department
Admission: EM | Admit: 2018-08-07 | Discharge: 2018-08-07 | Disposition: A | Discharge status: Home / Self Care | Payer: Medicaid Other | Attending: Emergency Medicine | Admitting: Emergency Medicine

## 2018-08-07 DIAGNOSIS — J449 Chronic obstructive pulmonary disease, unspecified: Secondary | ICD-10-CM | POA: Diagnosis not present

## 2018-08-07 DIAGNOSIS — R51 Headache: Secondary | ICD-10-CM | POA: Diagnosis present

## 2018-08-07 DIAGNOSIS — Z79899 Other long term (current) drug therapy: Secondary | ICD-10-CM | POA: Insufficient documentation

## 2018-08-07 DIAGNOSIS — J45909 Unspecified asthma, uncomplicated: Secondary | ICD-10-CM | POA: Insufficient documentation

## 2018-08-07 DIAGNOSIS — R220 Localized swelling, mass and lump, head: Secondary | ICD-10-CM | POA: Diagnosis not present

## 2018-08-07 DIAGNOSIS — L03811 Cellulitis of head [any part, except face]: Secondary | ICD-10-CM | POA: Diagnosis not present

## 2018-08-07 DIAGNOSIS — E039 Hypothyroidism, unspecified: Secondary | ICD-10-CM | POA: Insufficient documentation

## 2018-08-07 DIAGNOSIS — F17210 Nicotine dependence, cigarettes, uncomplicated: Secondary | ICD-10-CM | POA: Diagnosis not present

## 2018-08-07 DIAGNOSIS — I10 Essential (primary) hypertension: Secondary | ICD-10-CM | POA: Diagnosis not present

## 2018-08-07 DIAGNOSIS — Z880 Allergy status to penicillin: Secondary | ICD-10-CM | POA: Insufficient documentation

## 2018-08-07 DIAGNOSIS — Z91040 Latex allergy status: Secondary | ICD-10-CM | POA: Diagnosis not present

## 2018-08-07 DIAGNOSIS — H6011 Cellulitis of right external ear: Secondary | ICD-10-CM | POA: Diagnosis not present

## 2018-08-07 DIAGNOSIS — L0390 Cellulitis, unspecified: Secondary | ICD-10-CM

## 2018-08-07 LAB — COMPREHENSIVE METABOLIC PANEL
ALT: 30 U/L (ref 0–44)
AST: 22 U/L (ref 15–41)
Albumin: 3.7 g/dL (ref 3.5–5.0)
Alkaline Phosphatase: 74 U/L (ref 38–126)
Anion gap: 10 (ref 5–15)
BUN: 14 mg/dL (ref 6–20)
CO2: 27 mmol/L (ref 22–32)
Calcium: 8.4 mg/dL — ABNORMAL LOW (ref 8.9–10.3)
Chloride: 101 mmol/L (ref 98–111)
Creatinine, Ser: 0.84 mg/dL (ref 0.44–1.00)
GFR calc Af Amer: >60 mL/min (ref >60)
GFR calc non Af Amer: >60 mL/min (ref >60)
Glucose, Bld: 125 mg/dL — ABNORMAL HIGH (ref 70–99)
Potassium: 4.3 mmol/L (ref 3.5–5.1)
Sodium: 138 mmol/L (ref 135–145)
Total Bilirubin: 0.7 mg/dL (ref 0.3–1.2)
Total Protein: 7.6 g/dL (ref 6.5–8.1)

## 2018-08-07 LAB — CBC
HCT: 43.5 % (ref 36.0–46.0)
Hemoglobin: 14.2 g/dL (ref 12.0–15.0)
MCH: 29.6 pg (ref 26.0–34.0)
MCHC: 32.6 g/dL (ref 30.0–36.0)
MCV: 90.8 fL (ref 80.0–100.0)
Platelets: 384 10*3/uL (ref 150–400)
RBC: 4.79 MIL/uL (ref 3.87–5.11)
RDW: 14.1 % (ref 11.5–15.5)
WBC: 10.8 10*3/uL — ABNORMAL HIGH (ref 4.0–10.5)
nRBC: 0 % (ref 0.0–0.2)

## 2018-08-07 MED ORDER — CLINDAMYCIN HCL 150 MG PO CAPS
300.0000 mg | ORAL_CAPSULE | Freq: Once | ORAL | Status: AC
  Administered 2018-08-07: 05:00:00 300 mg via ORAL
  Filled 2018-08-07: qty 2

## 2018-08-07 MED ORDER — IOHEXOL 300 MG/ML  SOLN
75.0000 mL | Freq: Once | INTRAMUSCULAR | Status: AC | PRN
  Administered 2018-08-07: 75 mL via INTRAVENOUS

## 2018-08-07 MED ORDER — CLINDAMYCIN HCL 300 MG PO CAPS: 300.0000 mg | ORAL_CAPSULE | Freq: Three times a day (TID) | ORAL | 0 refills | Status: DC

## 2018-08-07 NOTE — ED Triage Notes (Signed)
Pt arrives via [facility] with c/o pain in the back of her head x 2 days but with increased pain tonight. Pt denies injury or trauma. Pt has raised place on the back of her head at this time. Pt is in NAD.

## 2018-08-07 NOTE — ED Provider Notes (Signed)
[facility] Emergency Department Provider Note  Time seen: 2:54 AM  I have reviewed the triage vital signs and the nursing notes.   HISTORY  Chief Complaint Headache   HPI Cheryl Chandler is a 53 y.o. female with a past medical history of COPD, hypertension, presents to the emergency department for pain on the right side of her head.  According to the patient over the past 3 to 4 days she has been experiencing progressively worsening pain behind her right ear.  Patient has redness and swelling to this area as well as tenderness.  Patient denies any known injury.  Denies any fever.  States chronic cough but denies any worsening.  Denies any shortness of breath or recent travel.    Past Medical History:  Diagnosis Date  . Asthma   . COPD (chronic obstructive pulmonary disease) (HCC)   . Hypertension   . Pericarditis   . Personal history of urinary calculi 11/29/2014  . Thyroid disease     Patient Active Problem List   Diagnosis Date Noted  . Apnea 01/02/2016  . Edema 11/30/2014  . Dyspnea on exertion 11/30/2014  . Abdominal pain 11/29/2014  . Anxiety 11/29/2014  . Arthritis 11/29/2014  . Airway hyperreactivity 11/29/2014  . CAFL (chronic airflow limitation) (HCC) 11/29/2014  . Clinical depression 11/29/2014  . H/O eating disorder 11/29/2014  . Esophageal reflux 11/29/2014  . Acne inversa 11/29/2014  . BP (high blood pressure) 11/29/2014  . HLD (hyperlipidemia) 11/29/2014  . Adult hypothyroidism 11/29/2014  . Insomnia 11/29/2014  . LBP (low back pain) 11/29/2014  . Leg numbness 11/29/2014  . Morbid obesity (HCC) 11/29/2014  . Posttraumatic stress disorder 11/29/2014  . Situational stress 11/29/2014  . Compulsive tobacco user syndrome 11/29/2014  . Vitamin D deficiency 11/29/2014  . Tobacco abuse 11/29/2014    Past Surgical History:  Procedure Laterality Date  . APPENDECTOMY    . arm surgery Left   . fatty tumor removed from back    .  LEG SURGERY Bilateral   . OVARIAN CYST SURGERY Right    took piece of right ovary due to cyst  . TOTAL HIP ARTHROPLASTY Right     Prior to Admission medications   Medication Sig Start Date End Date Taking? Authorizing Provider  albuterol (PROVENTIL) (2.5 MG/3ML) 0.083% nebulizer solution inhale contents of 1 vial in nebulizer every 4 hours if needed for wheezing and shortness of breath 12/30/16   Malva Limes, MD  Cholecalciferol (VITAMIN D3) 2000 UNITS TABS Take 1 tablet by mouth daily.     [provider]  citalopram (CELEXA) 20 MG tablet TAKE 1 TABLET BY MOUTH ONCE DAILY. 04/02/18   Malva Limes, MD  clindamycin (CLEOCIN) 300 MG capsule Take 1 capsule (300 mg total) by mouth 3 (three) times daily. 10/21/17   Chauvin, Molly Maduro, PA  DULERA 100-5 MCG/ACT AERO INHALE 2 PUFFS BY MOUTH TWICE A DAY. RINSE MOUTH AFTER USE. 07/12/17   Malva Limes, MD  EPINEPHrine 0.3 mg/0.3 mL IJ SOAJ injection USE AS DIRECTED. 12/09/17   Malva Limes, MD  furosemide (LASIX) 20 MG tablet TAKE 1 TABLET BY MOUTH ONCE DAILY 04/02/18   Malva Limes, MD  gabapentin (NEURONTIN) 100 MG capsule take 1-2 capsules by mouth at bedtime 07/19/17   Malva Limes, MD  levothyroxine (SYNTHROID, LEVOTHROID) 50 MCG tablet Take 1 tablet (50 mcg total) by mouth daily. 07/14/17   Malva Limes, MD  lidocaine (XYLOCAINE) 2 % solution Use  as directed 10 mLs in the mouth or throat as needed for mouth pain. 10/21/17   Anola Gurneyhauvin, Robert, PA  lisinopril (PRINIVIL,ZESTRIL) 20 MG tablet TAKE 1 TABLET BY MOUTH ONCE DAILY 06/29/18   Malva LimesFisher, Donald E, MD  meloxicam (MOBIC) 15 MG tablet TAKE 1 TABLET BY MOUTH ONCE DAILY WITH FOOD OR MILK IF NEEDED. 06/29/18   Malva LimesFisher, Donald E, MD  methocarbamol (ROBAXIN) 750 MG tablet take 1-2 tablets by mouth three times a day if needed for back pain 07/19/17   Malva LimesFisher, Donald E, MD  montelukast (SINGULAIR) 10 MG tablet Take 1 tablet (10 mg total) by mouth daily. 07/14/17   Malva LimesFisher, Donald E, MD   nitroGLYCERIN (NITROSTAT) 0.4 MG SL tablet DISSOLVE 1 TABLET UNDER TONGUE AT ONSET OF CHEST PAIN. REPEAT IN 5 MIN IF NOT RESOLVED, MAX 3 DOSES. 911 IF NEEDED. 07/18/17   Malva LimesFisher, Donald E, MD  omeprazole (PRILOSEC) 20 MG capsule TAKE 1 CAPSULE BY MOUTH ONCE DAILY. 11/07/17   Malva LimesFisher, Donald E, MD  predniSONE (DELTASONE) 20 MG tablet Take 2 tablets (40 mg total) by mouth daily. 07/10/17   Phineas SemenGoodman, Graydon, MD  predniSONE (DELTASONE) 50 MG tablet Take 1 tablet (50 mg total) by mouth daily with breakfast. Patient not taking: Reported on 07/10/2017 11/18/15   Jene EveryKinner, Robert, MD  [facility] HFA 108 [phone](90 Base) MCG/ACT inhaler inhale 2 puffs by mouth every 4 to 6 hours if needed for wheezing 02/21/17   Malva LimesFisher, Donald E, MD  simvastatin (ZOCOR) 40 MG tablet TAKE 1 TABLET BY MOUTH ONCE DAILY 04/02/18   Malva LimesFisher, Donald E, MD  traMADol (ULTRAM) 50 MG tablet TAKE 1 TABLET BY MOUTH EVERY 8 HOURS AS NEEDED FOR PAIN 12/09/17   Malva LimesFisher, Donald E, MD    Allergies  Allergen Reactions  . Codeine Shortness Of Breath and Rash  . Sulfa Antibiotics Shortness Of Breath and Rash  . Aspirin Hives  . Bee Venom     Bee stings  . Darvon [Propoxyphene]     Darvocet-N 100  . Latex   . Oxycodone Nausea And Vomiting  . Penicillins     Has patient had a PCN reaction causing immediate rash, facial/tongue/throat swelling, SOB or lightheadedness with hypotension: Unknown Has patient had a PCN reaction causing severe rash involving mucus membranes or skin necrosis: Unknown Has patient had a PCN reaction that required hospitalization: Unknown Has patient had a PCN reaction occurring within the last 10 years: Unknown If all of the above answers are "NO", then may proceed with Cephalosporin use.    Family History  Problem Relation Age of Onset  . Seizures Sister        disorders    Social History Social History   Tobacco Use  . Smoking status: Current Every Day Smoker    Packs/day: 0.25    Years: 30.00    Pack years: 7.50     Types: Cigarettes  . Smokeless tobacco: Never Used  Substance Use Topics  . Alcohol use: No    Alcohol/week: 0.0 standard drinks  . Drug use: No    Review of Systems Constitutional: Negative for fever. ENT: Negative for recent illness/congestion Cardiovascular: Negative for chest pain. Respiratory: Negative for shortness of breath. Gastrointestinal: Negative for abdominal pain Musculoskeletal: Negative for musculoskeletal complaints Skin: Redness/tenderness behind right ear Neurological: Positive for head pain All other ROS negative  ____________________________________________   PHYSICAL EXAM:  VITAL SIGNS: ED Triage Vitals  Enc Vitals Group     BP 08/07/18 0246 (!) 176/76  Pulse Rate 08/07/18 0246 85     Resp 08/07/18 0246 18     Temp 08/07/18 0246 98.5 F (36.9 C)     Temp Source 08/07/18 0246 Oral     SpO2 08/07/18 0246 98 %     Weight 08/07/18 0242 300 lb (136.1 kg)     Height 08/07/18 0242  (1.626 m)     Head Circumference --      Peak Flow --      Pain Score 08/07/18 0242 10     Pain Loc --      Pain Edu? --      Excl. in GC? --    Constitutional: Alert and oriented. Well appearing and in no distress. Eyes: Normal exam ENT      Head: Patient has an area of erythema and tenderness behind the right ear overlying the mastoid process.  Normal tympanic membranes.  Normal external auditory canal.      Mouth/Throat: Mucous membranes are moist. Cardiovascular: Normal rate, regular rhythm.  Respiratory: Normal respiratory effort without tachypnea nor retractions. Breath sounds are clear  Gastrointestinal: Soft and nontender. No distention.  Musculoskeletal: Nontender with normal range of motion in all extremities.  Neurologic:  Normal speech and language. No gross focal neurologic deficits  Skin:  Skin is warm, dry and intact.  Psychiatric: Mood and affect are normal.    RADIOLOGY  IMPRESSION: 1. 18 x 13 x 13 mm right postauricular soft tissue  lesion, indeterminate, but could reflect an enlarged reactive/suppurative lymph node or focal abscess. Associated soft tissue swelling and inflammatory stranding within the adjacent postauricular soft tissues compatible with associated cellulitis. No CT findings to suggest associated otomastoiditis. 2. Prominent periapical lucency/abscess about the right second mandibular molar. No associated inflammatory changes.  ____________________________________________   INITIAL IMPRESSION / ASSESSMENT AND PLAN / ED COURSE  Pertinent labs & imaging results that were available during my care of the patient were reviewed by me and considered in my medical decision making (see chart for details).   Patient presents to the emergency department for tenderness and redness behind her right ear causing head pain.  Differential would include localized cellulitis, abscess, cyst, mastoiditis.  We will obtain labs, CT imaging with contrast of the area to further evaluate.  Patient agreeable to plan of care.  CT scan shows likely reactive node with surrounding cellulitis.  We will discharge on clindamycin and have the patient follow-up with ENT for further evaluation.  Patient agreeable to plan of care.  Emil Kennis Buell was evaluated in Emergency Department on 08/07/2018 for the symptoms described in the history of present illness. She was evaluated in the context of the global COVID-19 pandemic, which necessitated consideration that the patient might be at risk for infection with the SARS-CoV-2 virus that causes COVID-19. Institutional protocols and algorithms that pertain to the evaluation of patients at risk for COVID-19 are in a state of rapid change based on information released by regulatory bodies including the CDC and federal and state organizations. These policies and algorithms were followed during the patient's care in the ED.  ____________________________________________   FINAL CLINICAL  IMPRESSION(S) / ED DIAGNOSES  Cellulitis   Minna Antis, MD 08/07/18 [phone]

## 2018-08-07 NOTE — ED Notes (Signed)
Pt up to the BR at this time.

## 2018-08-07 NOTE — ED Notes (Signed)
Pt to CT at this time.

## 2018-08-16 ENCOUNTER — Encounter: Payer: Self-pay | Admitting: Emergency Medicine

## 2018-08-16 ENCOUNTER — Other Ambulatory Visit: Payer: Self-pay

## 2018-08-16 ENCOUNTER — Emergency Department
Admission: EM | Admit: 2018-08-16 | Discharge: 2018-08-16 | Disposition: A | Discharge status: Home / Self Care | Payer: Medicaid Other | Attending: Student in an Organized Health Care Education/Training Program | Admitting: Student in an Organized Health Care Education/Training Program

## 2018-08-16 DIAGNOSIS — Z79899 Other long term (current) drug therapy: Secondary | ICD-10-CM | POA: Insufficient documentation

## 2018-08-16 DIAGNOSIS — I10 Essential (primary) hypertension: Secondary | ICD-10-CM | POA: Insufficient documentation

## 2018-08-16 DIAGNOSIS — J45909 Unspecified asthma, uncomplicated: Secondary | ICD-10-CM | POA: Diagnosis not present

## 2018-08-16 DIAGNOSIS — E039 Hypothyroidism, unspecified: Secondary | ICD-10-CM | POA: Insufficient documentation

## 2018-08-16 DIAGNOSIS — L0201 Cutaneous abscess of face: Secondary | ICD-10-CM | POA: Insufficient documentation

## 2018-08-16 DIAGNOSIS — Z91040 Latex allergy status: Secondary | ICD-10-CM | POA: Diagnosis not present

## 2018-08-16 DIAGNOSIS — F17210 Nicotine dependence, cigarettes, uncomplicated: Secondary | ICD-10-CM | POA: Diagnosis not present

## 2018-08-16 DIAGNOSIS — L0291 Cutaneous abscess, unspecified: Secondary | ICD-10-CM

## 2018-08-16 DIAGNOSIS — J449 Chronic obstructive pulmonary disease, unspecified: Secondary | ICD-10-CM | POA: Insufficient documentation

## 2018-08-16 DIAGNOSIS — L0211 Cutaneous abscess of neck: Secondary | ICD-10-CM | POA: Diagnosis not present

## 2018-08-16 MED ORDER — LIDOCAINE HCL (PF) 1 % IJ SOLN
5.0000 mL | Freq: Once | INTRAMUSCULAR | Status: AC
  Administered 2018-08-16: 11:00:00 5 mL via INTRADERMAL
  Filled 2018-08-16: qty 5

## 2018-08-16 MED ORDER — CLINDAMYCIN HCL 300 MG PO CAPS: 300.0000 mg | ORAL_CAPSULE | Freq: Three times a day (TID) | ORAL | 0 refills | Status: AC

## 2018-08-16 NOTE — ED Provider Notes (Signed)
[facility] Emergency Department Provider Note    First MD Initiated Contact with Patient 08/16/18 1015     (approximate)  I have reviewed the triage vital signs and the nursing notes.   HISTORY  Chief Complaint Abscess    HPI Cheryl Chandler is a 53 y.o. female resents to the ER for evaluation of drainage coming from recent area diagnosis of reactive lymph node and cellulitis of the right mastoid region.  No fevers.  States that she is been putting topical antibiotic on it.  Started having some bleeding and drainage from it this morning and wanted reevaluation.  Scribes the discomfort as mild to moderate in severity.    Past Medical History:  Diagnosis Date  . Asthma   . COPD (chronic obstructive pulmonary disease) (HCC)   . Hypertension   . Pericarditis   . Personal history of urinary calculi 11/29/2014  . Thyroid disease    Family History  Problem Relation Age of Onset  . Seizures Sister        disorders   Past Surgical History:  Procedure Laterality Date  . APPENDECTOMY    . arm surgery Left   . fatty tumor removed from back    . LEG SURGERY Bilateral   . OVARIAN CYST SURGERY Right    took piece of right ovary due to cyst  . TOTAL HIP ARTHROPLASTY Right    Patient Active Problem List   Diagnosis Date Noted  . Apnea 01/02/2016  . Edema 11/30/2014  . Dyspnea on exertion 11/30/2014  . Abdominal pain 11/29/2014  . Anxiety 11/29/2014  . Arthritis 11/29/2014  . Airway hyperreactivity 11/29/2014  . CAFL (chronic airflow limitation) (HCC) 11/29/2014  . Clinical depression 11/29/2014  . H/O eating disorder 11/29/2014  . Esophageal reflux 11/29/2014  . Acne inversa 11/29/2014  . BP (high blood pressure) 11/29/2014  . HLD (hyperlipidemia) 11/29/2014  . Adult hypothyroidism 11/29/2014  . Insomnia 11/29/2014  . LBP (low back pain) 11/29/2014  . Leg numbness 11/29/2014  . Morbid obesity (HCC) 11/29/2014  . Posttraumatic stress  disorder 11/29/2014  . Situational stress 11/29/2014  . Compulsive tobacco user syndrome 11/29/2014  . Vitamin D deficiency 11/29/2014  . Tobacco abuse 11/29/2014      Prior to Admission medications   Medication Sig Start Date End Date Taking? Authorizing Provider  albuterol (PROVENTIL) (2.5 MG/3ML) 0.083% nebulizer solution inhale contents of 1 vial in nebulizer every 4 hours if needed for wheezing and shortness of breath 12/30/16   Malva LimesFisher, Donald E, MD  Cholecalciferol (VITAMIN D3) 2000 UNITS TABS Take 1 tablet by mouth daily.     [provider]  citalopram (CELEXA) 20 MG tablet TAKE 1 TABLET BY MOUTH ONCE DAILY. 04/02/18   Malva LimesFisher, Donald E, MD  clindamycin (CLEOCIN) 300 MG capsule Take 1 capsule (300 mg total) by mouth 3 (three) times daily for 10 days. 08/07/18 08/17/18  Minna AntisPaduchowski, Kevin, MD  DULERA 100-5 MCG/ACT AERO INHALE 2 PUFFS BY MOUTH TWICE A DAY. RINSE MOUTH AFTER USE. 07/12/17   Malva LimesFisher, Donald E, MD  EPINEPHrine 0.3 mg/0.3 mL IJ SOAJ injection USE AS DIRECTED. 12/09/17   Malva LimesFisher, Donald E, MD  furosemide (LASIX) 20 MG tablet TAKE 1 TABLET BY MOUTH ONCE DAILY 04/02/18   Malva LimesFisher, Donald E, MD  gabapentin (NEURONTIN) 100 MG capsule take 1-2 capsules by mouth at bedtime 07/19/17   Malva LimesFisher, Donald E, MD  levothyroxine (SYNTHROID, LEVOTHROID) 50 MCG tablet Take 1 tablet (50 mcg total) by mouth  daily. 07/14/17   Malva Limes, MD  lidocaine (XYLOCAINE) 2 % solution Use as directed 10 mLs in the mouth or throat as needed for mouth pain. 10/21/17   Anola Gurney, PA  lisinopril (PRINIVIL,ZESTRIL) 20 MG tablet TAKE 1 TABLET BY MOUTH ONCE DAILY 06/29/18   Malva Limes, MD  meloxicam (MOBIC) 15 MG tablet TAKE 1 TABLET BY MOUTH ONCE DAILY WITH FOOD OR MILK IF NEEDED. 06/29/18   Malva Limes, MD  methocarbamol (ROBAXIN) 750 MG tablet take 1-2 tablets by mouth three times a day if needed for back pain 07/19/17   Malva Limes, MD  montelukast (SINGULAIR) 10 MG tablet Take 1 tablet (10  mg total) by mouth daily. 07/14/17   Malva Limes, MD  nitroGLYCERIN (NITROSTAT) 0.4 MG SL tablet DISSOLVE 1 TABLET UNDER TONGUE AT ONSET OF CHEST PAIN. REPEAT IN 5 MIN IF NOT RESOLVED, MAX 3 DOSES. 911 IF NEEDED. 07/18/17   Malva Limes, MD  omeprazole (PRILOSEC) 20 MG capsule TAKE 1 CAPSULE BY MOUTH ONCE DAILY. 11/07/17   Malva Limes, MD  predniSONE (DELTASONE) 20 MG tablet Take 2 tablets (40 mg total) by mouth daily. 07/10/17   Phineas Semen, MD  predniSONE (DELTASONE) 50 MG tablet Take 1 tablet (50 mg total) by mouth daily with breakfast. Patient not taking: Reported on 07/10/2017 11/18/15   Jene Every, MD  [facility] HFA 108 [phone] Base) MCG/ACT inhaler inhale 2 puffs by mouth every 4 to 6 hours if needed for wheezing 02/21/17   Malva Limes, MD  simvastatin (ZOCOR) 40 MG tablet TAKE 1 TABLET BY MOUTH ONCE DAILY 04/02/18   Malva Limes, MD  traMADol (ULTRAM) 50 MG tablet TAKE 1 TABLET BY MOUTH EVERY 8 HOURS AS NEEDED FOR PAIN 12/09/17   Malva Limes, MD    Allergies Codeine; Sulfa antibiotics; Aspirin; Bee venom; Darvon [propoxyphene]; Latex; Oxycodone; and Penicillins    Social History Social History   Tobacco Use  . Smoking status: Current Every Day Smoker    Packs/day: 0.25    Years: 30.00    Pack years: 7.50    Types: Cigarettes  . Smokeless tobacco: Never Used  Substance Use Topics  . Alcohol use: No    Alcohol/week: 0.0 standard drinks  . Drug use: No    Review of Systems Patient denies headaches, rhinorrhea, blurry vision, numbness, shortness of breath, chest pain, edema, cough, abdominal pain, nausea, vomiting, diarrhea, dysuria, fevers, rashes or hallucinations unless otherwise stated above in HPI. ____________________________________________   PHYSICAL EXAM:  VITAL SIGNS: Vitals:   08/16/18 0958  BP: (!) 152/46  Pulse: 85  Resp: 17  Temp: 98 F (36.7 C)  SpO2: 96%    Constitutional: Alert and oriented.  Eyes: Conjunctivae are normal.   Head: Atraumatic. Nose: No congestion/rhinnorhea. Mouth/Throat: Mucous membranes are moist.   Neck: No stridor. Painless ROM.  Area the size of golf ball consistent with abscess to the right mastoid area.  Is fluctuant with some purulent drainage. Cardiovascular: Normal rate, regular rhythm. Grossly normal heart sounds.  Good peripheral circulation. Respiratory: Normal respiratory effort.  No retractions. Lungs CTAB. Gastrointestinal: Soft and nontender. No distention. No abdominal bruits. No CVA tenderness. Genitourinary:  Musculoskeletal: No lower extremity tenderness nor edema.  No joint effusions. Neurologic:  Normal speech and language. No gross focal neurologic deficits are appreciated. No facial droop Skin:  Skin is warm, dry and intact. No rash noted. Psychiatric: Mood and affect are normal. Speech and behavior are  normal.  ____________________________________________   LABS (all labs ordered are listed, but only abnormal results are displayed)  No results found for this or any previous visit (from the past 24 hour(s)). ____________________________________________ ____________________________________________   PROCEDURES  Procedure(s) performed:  Marland KitchenMarland KitchenIncision and Drainage Date/Time: 08/16/2018 11:15 AM Performed by: Willy Eddy, MD Authorized by: Willy Eddy, MD   Consent:    Consent obtained:  Verbal   Consent given by:  Patient   Risks discussed:  Bleeding, infection, incomplete drainage and pain   Alternatives discussed:  Alternative treatment, delayed treatment and observation Location:    Type:  Abscess   Location:  Neck   Neck location:  R posterior Pre-procedure details:    Skin preparation:  Betadine Anesthesia (see MAR for exact dosages):    Anesthesia method:  Local infiltration   Local anesthetic:  Lidocaine 1% w/o epi Procedure type:    Complexity:  Complex Procedure details:    Incision types:  Single straight   Incision depth:   Subcutaneous   Scalpel blade:  11   Wound management:  Probed and deloculated   Drainage:  Purulent   Drainage amount:  Moderate   Wound treatment:  Wound left open   Packing materials:  None Post-procedure details:    Patient tolerance of procedure:  Tolerated well, no immediate complications      Critical Care performed: no ____________________________________________   INITIAL IMPRESSION / ASSESSMENT AND PLAN / ED COURSE  Pertinent labs & imaging results that were available during my care of the patient were reviewed by me and considered in my medical decision making (see chart for details).   DDX: abcess, cellulitis, foliculitis, cyst  Cheryl Chandler is a 53 y.o. who presents to the ED with abscess and cellulitis as described above.  Abscess was I&D with moderate to significant amount of purulent drainage.  We will continue antibiotics.  Patient stable appropriate for outpatient follow-up.     The patient was evaluated in Emergency Department today for the symptoms described in the history of present illness. He/she was evaluated in the context of the global COVID-19 pandemic, which necessitated consideration that the patient might be at risk for infection with the SARS-CoV-2 virus that causes COVID-19. Institutional protocols and algorithms that pertain to the evaluation of patients at risk for COVID-19 are in a state of rapid change based on information released by regulatory bodies including the CDC and federal and state organizations. These policies and algorithms were followed during the patient's care in the ED.  As part of my medical decision making, I reviewed the following data within the electronic MEDICAL RECORD NUMBER Nursing notes reviewed and incorporated, Labs reviewed, notes from prior ED visits and [place] Controlled Substance Database   ____________________________________________   FINAL CLINICAL IMPRESSION(S) / ED DIAGNOSES  Final diagnoses:  Abscess       NEW MEDICATIONS STARTED DURING THIS VISIT:  New Prescriptions   No medications on file     Note:  This document was prepared using Dragon voice recognition software and may include unintentional dictation errors.    Willy Eddy, MD 08/16/18 1116

## 2018-08-16 NOTE — ED Triage Notes (Signed)
Patient presents to ED via POV from home due to abscess behind her right ear. Purulent drainage noted to wound. Patient reports being here 1 week ago for same and being place on antibiotics, patient reports she is still taking them. Patient reports coming here "because I think it popped open".

## 2018-08-23 ENCOUNTER — Other Ambulatory Visit: Payer: Self-pay | Admitting: Family Medicine

## 2018-08-23 DIAGNOSIS — M542 Cervicalgia: Secondary | ICD-10-CM

## 2018-08-23 DIAGNOSIS — J301 Allergic rhinitis due to pollen: Secondary | ICD-10-CM

## 2018-08-23 DIAGNOSIS — M62838 Other muscle spasm: Secondary | ICD-10-CM

## 2018-08-23 DIAGNOSIS — E039 Hypothyroidism, unspecified: Secondary | ICD-10-CM

## 2018-09-15 ENCOUNTER — Telehealth: Payer: Self-pay | Admitting: Family Medicine

## 2018-09-15 ENCOUNTER — Emergency Department
Admission: EM | Admit: 2018-09-15 | Discharge: 2018-09-15 | Disposition: A | Discharge status: Home / Self Care | Payer: Medicaid Other | Attending: Emergency Medicine | Admitting: Emergency Medicine

## 2018-09-15 ENCOUNTER — Emergency Department: Payer: Medicaid Other

## 2018-09-15 ENCOUNTER — Other Ambulatory Visit: Payer: Self-pay

## 2018-09-15 DIAGNOSIS — R079 Chest pain, unspecified: Secondary | ICD-10-CM | POA: Diagnosis not present

## 2018-09-15 DIAGNOSIS — M79604 Pain in right leg: Secondary | ICD-10-CM

## 2018-09-15 DIAGNOSIS — M25551 Pain in right hip: Secondary | ICD-10-CM | POA: Diagnosis not present

## 2018-09-15 DIAGNOSIS — Z96641 Presence of right artificial hip joint: Secondary | ICD-10-CM | POA: Insufficient documentation

## 2018-09-15 DIAGNOSIS — E039 Hypothyroidism, unspecified: Secondary | ICD-10-CM | POA: Insufficient documentation

## 2018-09-15 DIAGNOSIS — F17210 Nicotine dependence, cigarettes, uncomplicated: Secondary | ICD-10-CM | POA: Diagnosis not present

## 2018-09-15 DIAGNOSIS — M79661 Pain in right lower leg: Secondary | ICD-10-CM | POA: Diagnosis not present

## 2018-09-15 DIAGNOSIS — I10 Essential (primary) hypertension: Secondary | ICD-10-CM | POA: Insufficient documentation

## 2018-09-15 DIAGNOSIS — Z91040 Latex allergy status: Secondary | ICD-10-CM | POA: Insufficient documentation

## 2018-09-15 DIAGNOSIS — J449 Chronic obstructive pulmonary disease, unspecified: Secondary | ICD-10-CM | POA: Insufficient documentation

## 2018-09-15 DIAGNOSIS — M545 Low back pain: Secondary | ICD-10-CM | POA: Diagnosis not present

## 2018-09-15 DIAGNOSIS — R609 Edema, unspecified: Secondary | ICD-10-CM | POA: Diagnosis not present

## 2018-09-15 LAB — CBC WITH DIFFERENTIAL/PLATELET
Abs Immature Granulocytes: 0.04 10*3/uL (ref 0.00–0.07)
Basophils Absolute: 0.1 10*3/uL (ref 0.0–0.1)
Basophils Relative: 1 %
Eosinophils Absolute: 0.1 10*3/uL (ref 0.0–0.5)
Eosinophils Relative: 2 %
HCT: 41 % (ref 36.0–46.0)
Hemoglobin: 13.3 g/dL (ref 12.0–15.0)
Immature Granulocytes: 1 %
Lymphocytes Relative: 20 %
Lymphs Abs: 1.8 10*3/uL (ref 0.7–4.0)
MCH: 29.7 pg (ref 26.0–34.0)
MCHC: 32.4 g/dL (ref 30.0–36.0)
MCV: 91.5 fL (ref 80.0–100.0)
Monocytes Absolute: 0.6 10*3/uL (ref 0.1–1.0)
Monocytes Relative: 7 %
Neutro Abs: 6.3 10*3/uL (ref 1.7–7.7)
Neutrophils Relative %: 69 %
Platelets: 374 10*3/uL (ref 150–400)
RBC: 4.48 MIL/uL (ref 3.87–5.11)
RDW: 14.5 % (ref 11.5–15.5)
WBC: 8.8 10*3/uL (ref 4.0–10.5)
nRBC: 0 % (ref 0.0–0.2)

## 2018-09-15 LAB — BASIC METABOLIC PANEL
Anion gap: 10 (ref 5–15)
BUN: 15 mg/dL (ref 6–20)
CO2: 25 mmol/L (ref 22–32)
Calcium: 8.4 mg/dL — ABNORMAL LOW (ref 8.9–10.3)
Chloride: 105 mmol/L (ref 98–111)
Creatinine, Ser: 0.75 mg/dL (ref 0.44–1.00)
GFR calc Af Amer: >60 mL/min (ref >60)
GFR calc non Af Amer: >60 mL/min (ref >60)
Glucose, Bld: 110 mg/dL — ABNORMAL HIGH (ref 70–99)
Potassium: 4.2 mmol/L (ref 3.5–5.1)
Sodium: 140 mmol/L (ref 135–145)

## 2018-09-15 LAB — URINALYSIS, COMPLETE (UACMP) WITH MICROSCOPIC
Bacteria, UA: NONE SEEN
Bilirubin Urine: NEGATIVE
Glucose, UA: NEGATIVE mg/dL
Hgb urine dipstick: NEGATIVE
Ketones, ur: NEGATIVE mg/dL
Leukocytes,Ua: NEGATIVE
Nitrite: NEGATIVE
Protein, ur: NEGATIVE mg/dL
Specific Gravity, Urine: 1.006 (ref 1.005–1.030)
WBC, UA: NONE SEEN WBC/hpf (ref 0–5)
pH: 5 (ref 5.0–8.0)

## 2018-09-15 MED ORDER — ONDANSETRON HCL 4 MG/2ML IJ SOLN
4.0000 mg | Freq: Once | INTRAMUSCULAR | Status: AC
  Administered 2018-09-15: 4 mg via INTRAVENOUS
  Filled 2018-09-15: qty 2

## 2018-09-15 MED ORDER — FENTANYL CITRATE (PF) 100 MCG/2ML IJ SOLN
100.0000 ug | Freq: Once | INTRAMUSCULAR | Status: AC
  Administered 2018-09-15: 100 ug via INTRAVENOUS
  Filled 2018-09-15: qty 2

## 2018-09-15 NOTE — Telephone Encounter (Signed)
Copied from CRM [id number]. Topic: Quick Communication - See Telephone Encounter >> Sep 15, 2018 10:56 AM Trula Slade wrote: CRM for notification. See Telephone encounter for: 09/15/18. Patient called to say she is in a lot of pain and she can't stand on her legs.  She wanted the provider to know she is going to the hospital. [facility]

## 2018-09-15 NOTE — Telephone Encounter (Signed)
Pt has not established care as of yet. I spoke with her on 2 occassions, second time being this morning, and she stated that she was in some pain and that she would give [place] a call back when she was able to get a friend or family member there with her to do a virtual visit.
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
## 2018-09-15 NOTE — ED Triage Notes (Signed)
PT to ED via EMS from home. PT c/o R leg pain, has normal pain but pain has gotten unbearable last 3 days. Hx of cellulitis. PT denies falls or injury.

## 2018-09-15 NOTE — ED Notes (Signed)
Patient transported to Ultrasound 

## 2018-09-15 NOTE — Discharge Instructions (Addendum)
Follow-up with your doctor in 1 to 2 days.  Return to the emergency room if your leg is red, hot, if you have a fever, if you have new or worsening pain, weakness of your leg.

## 2018-09-28 ENCOUNTER — Other Ambulatory Visit: Payer: Self-pay | Admitting: Family Medicine

## 2018-09-28 DIAGNOSIS — R05 Cough: Secondary | ICD-10-CM

## 2018-09-28 DIAGNOSIS — M542 Cervicalgia: Secondary | ICD-10-CM

## 2018-09-28 DIAGNOSIS — R059 Cough, unspecified: Secondary | ICD-10-CM

## 2018-09-28 DIAGNOSIS — R0602 Shortness of breath: Secondary | ICD-10-CM

## 2018-09-29 ENCOUNTER — Other Ambulatory Visit: Payer: Self-pay

## 2018-09-29 DIAGNOSIS — M62838 Other muscle spasm: Secondary | ICD-10-CM

## 2018-09-29 NOTE — Telephone Encounter (Signed)
Patient notified and scheduled for 09/30/2018 2pm

## 2018-09-29 NOTE — Telephone Encounter (Signed)
[facility] faxed a Rx refill request on Lisinopril 20 mg tab, Methocarbamol 750 mg tab

## 2018-09-30 ENCOUNTER — Encounter: Payer: Self-pay | Admitting: Family Medicine

## 2018-09-30 ENCOUNTER — Ambulatory Visit: Payer: Medicaid Other | Admitting: Family Medicine

## 2018-09-30 ENCOUNTER — Other Ambulatory Visit: Payer: Self-pay

## 2018-09-30 VITALS — BP 149/68 | HR 90 | Temp 98.5°F

## 2018-09-30 DIAGNOSIS — I10 Essential (primary) hypertension: Secondary | ICD-10-CM | POA: Diagnosis not present

## 2018-09-30 DIAGNOSIS — M545 Low back pain: Secondary | ICD-10-CM

## 2018-09-30 DIAGNOSIS — F331 Major depressive disorder, recurrent, moderate: Secondary | ICD-10-CM

## 2018-09-30 DIAGNOSIS — E039 Hypothyroidism, unspecified: Secondary | ICD-10-CM | POA: Diagnosis not present

## 2018-09-30 DIAGNOSIS — R2681 Unsteadiness on feet: Secondary | ICD-10-CM | POA: Diagnosis not present

## 2018-09-30 DIAGNOSIS — F4310 Post-traumatic stress disorder, unspecified: Secondary | ICD-10-CM

## 2018-09-30 DIAGNOSIS — R0681 Apnea, not elsewhere classified: Secondary | ICD-10-CM

## 2018-09-30 DIAGNOSIS — R739 Hyperglycemia, unspecified: Secondary | ICD-10-CM | POA: Diagnosis not present

## 2018-09-30 DIAGNOSIS — R600 Localized edema: Secondary | ICD-10-CM | POA: Diagnosis not present

## 2018-09-30 DIAGNOSIS — R0609 Other forms of dyspnea: Secondary | ICD-10-CM

## 2018-09-30 DIAGNOSIS — E785 Hyperlipidemia, unspecified: Secondary | ICD-10-CM

## 2018-09-30 DIAGNOSIS — E559 Vitamin D deficiency, unspecified: Secondary | ICD-10-CM

## 2018-09-30 DIAGNOSIS — J4540 Moderate persistent asthma, uncomplicated: Secondary | ICD-10-CM

## 2018-09-30 DIAGNOSIS — N3281 Overactive bladder: Secondary | ICD-10-CM | POA: Diagnosis not present

## 2018-09-30 DIAGNOSIS — G8929 Other chronic pain: Secondary | ICD-10-CM

## 2018-09-30 DIAGNOSIS — M1990 Unspecified osteoarthritis, unspecified site: Secondary | ICD-10-CM

## 2018-09-30 DIAGNOSIS — J449 Chronic obstructive pulmonary disease, unspecified: Secondary | ICD-10-CM

## 2018-09-30 DIAGNOSIS — G629 Polyneuropathy, unspecified: Secondary | ICD-10-CM

## 2018-09-30 DIAGNOSIS — R079 Chest pain, unspecified: Secondary | ICD-10-CM

## 2018-09-30 MED ORDER — FLUTICASONE-UMECLIDIN-VILANT 100-62.5-25 MCG/INH IN AEPB: 1.0000 | INHALATION_SPRAY | Freq: Every day | RESPIRATORY_TRACT | 2 refills | Status: DC

## 2018-09-30 MED ORDER — GABAPENTIN 300 MG PO CAPS: 300.0000 mg | ORAL_CAPSULE | Freq: Three times a day (TID) | ORAL | 1 refills | Status: DC | PRN

## 2018-09-30 NOTE — Progress Notes (Signed)
BP (!) 149/68   Pulse 90   Temp 98.5 F (36.9 C) (Oral)   SpO2 96%    Subjective:    Patient ID: Cheryl Chandler, female    DOB: [birth year], 53 y.o.   MRN: [id number]  HPI: Cheryl Chandler is a 53 y.o. female  Chief Complaint  Patient presents with  . Establish Care   Here today to establish care. Was previously established at [facility], has been lost to follow up for 3 years. Has numerous concerns today.   Has dealt with multiple deaths in the family over the past year or so, feels her mental health is not under good control right now. Currently on celexa 20 mg. Has spoken with a crisis hotline on the phone since then and also has spoken with someone through social services that was provided for telephone counseling visits but otherwise has had no care in 3 years for this. Crying often, very down and feeling hopeless. No SI/HI.   Has a hx of neuropathy and arthritis with DDD. Taking 2 robaxin tabs every 8 hours, tramadol once daily, meloxicam once a day, and gabapentin 1-2 at bedtime. Rates her pain as 10/10, states lately it's been worse than a 10. Right leg much worse than the left. Uses a wheelchair now as walking is typically too painful. Using her mother's old wheelchair which is ill-fitting and does not have necessary components such as foot rests.   Thinks at one point she was told she had a heart attack during a hospitalization in the past and that she had fluid around her heart. Has never seen a Cardiologist that she can recall. Also has HTN and HLD. Currently on lasix and lisinopril. Has nitro at home and has to use it for severe anginal episodes, about 3-4 times per month on average. Also notes DOE that limits her activities quite a bit. Does not check home BPs, doesn't have a monitor for it.  Significant LE edema that has been ongoing for years. Also has a hx of recurrent LE cellulitis for which she has had several ER visits for the past year or so.  Now has chronic redness in LEs.   Never completed the sleep study after being told she had OSA, so she has never had a CPAP but sleeps poorly and feels she needs it.   Notes she has an overactive bladder and will occasionally have incontinence episodes. Wears depends quite often.   Hx of asthma and COPD, currently on dulera and albuterol nebulizers prn. Does not use albuterol inhaler. Frequent episodes of chest tightness, wheezing, SOB, coughing in general on this regimen. Current every day smoker.   Hx of vitamin D deficiency, has not been on supplementation for years.   Depression screen PHQ 2/9 09/30/2018  Decreased Interest 3  Down, Depressed, Hopeless 3  PHQ - 2 Score 6  Altered sleeping 3  Tired, decreased energy 3  Change in appetite 0  Feeling bad or failure about yourself  0  Trouble concentrating 0  Moving slowly or fidgety/restless 3  Suicidal thoughts 0  PHQ-9 Score 15  Difficult doing work/chores Not difficult at all   GAD 7 : Generalized Anxiety Score 09/30/2018  Nervous, Anxious, on Edge 0  Control/stop worrying 3  Worry too much - different things 3  Trouble relaxing 3  Restless 0  Easily annoyed or irritable 3  Afraid - awful might happen 0  Total GAD 7 Score 12  Anxiety Difficulty Not  difficult at all     Relevant past medical, surgical, family and social history reviewed and updated as indicated. Interim medical history since our last visit reviewed. Allergies and medications reviewed and updated.  Review of Systems  Per HPI unless specifically indicated above     Objective:    BP (!) 149/68   Pulse 90   Temp 98.5 F (36.9 C) (Oral)   SpO2 96%   Wt Readings from Last 3 Encounters:  09/15/18 300 lb (136.1 kg)  08/16/18 300 lb (136.1 kg)  08/07/18 300 lb (136.1 kg)    Physical Exam Vitals signs and nursing note reviewed.  Constitutional:      Appearance: She is obese. She is not ill-appearing.  HENT:     Head: Atraumatic.     Mouth/Throat:      Mouth: Mucous membranes are moist.  Eyes:     Extraocular Movements: Extraocular movements intact.     Conjunctiva/sclera: Conjunctivae normal.  Neck:     Musculoskeletal: Normal range of motion and neck supple.  Cardiovascular:     Rate and Rhythm: Normal rate.     Heart sounds: Normal heart sounds.  Pulmonary:     Effort: Pulmonary effort is normal.     Breath sounds: Normal breath sounds.  Musculoskeletal:     Comments: In wheelchair 2+ edema B/L LEs  Skin:    General: Skin is warm and dry.     Findings: Erythema (b/l lower legs) present.  Neurological:     Mental Status: She is alert and oriented to person, place, and time.  Psychiatric:        Thought Content: Thought content normal.        Judgment: Judgment normal.     Comments: tearful     Results for orders placed or performed during the hospital encounter of 09/15/18  CBC with Differential/Platelet  Result Value Ref Range   WBC 8.8 4.0 - 10.5 K/uL   RBC 4.48 3.87 - 5.11 MIL/uL   Hemoglobin 13.3 12.0 - 15.0 g/dL   HCT 16.1 09.6 - 04.5 %   MCV 91.5 80.0 - 100.0 fL   MCH 29.7 26.0 - 34.0 pg   MCHC 32.4 30.0 - 36.0 g/dL   RDW 40.9 81.1 - 91.4 %   Platelets 374 150 - 400 K/uL   nRBC 0.0 0.0 - 0.2 %   Neutrophils Relative % 69 %   Neutro Abs 6.3 1.7 - 7.7 K/uL   Lymphocytes Relative 20 %   Lymphs Abs 1.8 0.7 - 4.0 K/uL   Monocytes Relative 7 %   Monocytes Absolute 0.6 0.1 - 1.0 K/uL   Eosinophils Relative 2 %   Eosinophils Absolute 0.1 0.0 - 0.5 K/uL   Basophils Relative 1 %   Basophils Absolute 0.1 0.0 - 0.1 K/uL   Immature Granulocytes 1 %   Abs Immature Granulocytes 0.04 0.00 - 0.07 K/uL  Basic metabolic panel  Result Value Ref Range   Sodium 140 135 - 145 mmol/L   Potassium 4.2 3.5 - 5.1 mmol/L   Chloride 105 98 - 111 mmol/L   CO2 25 22 - 32 mmol/L   Glucose, Bld 110 (H) 70 - 99 mg/dL   BUN 15 6 - 20 mg/dL   Creatinine, Ser 7.82 0.44 - 1.00 mg/dL   Calcium 8.4 (L) 8.9 - 10.3 mg/dL   GFR calc  non Af Amer >60 >60 mL/min   GFR calc Af Amer >60 >60 mL/min   Anion gap 10  5 - 15  Urinalysis, Complete w Microscopic  Result Value Ref Range   Color, Urine STRAW (A) YELLOW   APPearance CLEAR (A) CLEAR   Specific Gravity, Urine 1.006 1.005 - 1.030   pH 5.0 5.0 - 8.0   Glucose, UA NEGATIVE NEGATIVE mg/dL   Hgb urine dipstick NEGATIVE NEGATIVE   Bilirubin Urine NEGATIVE NEGATIVE   Ketones, ur NEGATIVE NEGATIVE mg/dL   Protein, ur NEGATIVE NEGATIVE mg/dL   Nitrite NEGATIVE NEGATIVE   Leukocytes,Ua NEGATIVE NEGATIVE   WBC, UA NONE SEEN 0 - 5 WBC/hpf   Bacteria, UA NONE SEEN NONE SEEN   Squamous Epithelial / LPF 0-5 0 - 5      Assessment & Plan:   Problem List Items Addressed This Visit      Cardiovascular and Mediastinum   Essential hypertension    Not at goal, but very stressed right now. Will get her a home BP monitor and have her log home readings. F/u in 2 weeks for recheck. Will likely increase her lasix pending lab results to help both with BP and LE edema      Relevant Orders   CBC with Differential/Platelet     Respiratory   Asthma    Will increase inhaler regimen, continue singulair. Monitor closely for benefit      Relevant Medications   Fluticasone-Umeclidin-Vilant (TRELEGY ELLIPTA) 100-62.5-25 MCG/INH AEPB   COPD (chronic obstructive pulmonary disease) (HCC)    Poorly controlled on dulera, not compliant with albuterol prn. Will start trelegy to help with polypharmacy and compliance and monitor closely for benefit. Smoking cessation reviewed, pt not ready to quit      Relevant Medications   Fluticasone-Umeclidin-Vilant (TRELEGY ELLIPTA) 100-62.5-25 MCG/INH AEPB     Endocrine   Adult hypothyroidism - Primary    Recheck TSH, adjust as needed      Relevant Orders   TSH     Nervous and Auditory   Peripheral neuropathy    Severe and debilitating per patient. Will increase her gabapentin dose to 600-900 mg daily and await Pain Management consultation.  Continue current regimen as ordered by prior PCP otherwise.      Relevant Medications   gabapentin (NEURONTIN) 300 MG capsule   Other Relevant Orders   Ambulatory referral to Pain Clinic     Musculoskeletal and Integument   Arthritis    Increase gabapentin, continue remainder of regimen as prescribed by prior PCP and await Pain Management consultation        Genitourinary   Overactive bladder    Will provide home health orders for depends pads, may need medications to help with incontinence issues. Pelvic floor exercises reviewed        Other   Clinical depression    Referral placed to psychiatry for further management, and information for walk in clinics locally given if needed. Return precautions given. Continue current regimen in meantime      Relevant Orders   Referral to Chronic Care Management Services   Ambulatory referral to Psychiatry   HLD (hyperlipidemia)    Recheck labs, adjust as needed      Relevant Orders   Comprehensive metabolic panel   Lipid Panel w/o Chol/HDL Ratio   Low back pain    Await establish care visit with Pain Management, continue current regimen in meantime      Relevant Orders   Ambulatory referral to Pain Clinic   Posttraumatic stress disorder    Referral placed to Psychiatry, continue counseling virtual visits in meantime and  current regimen      Relevant Orders   Ambulatory referral to Psychiatry   Vitamin D deficiency    Recheck levels      Relevant Orders   Vitamin D (25 hydroxy)   Bilateral leg edema    Will increase lasix pending normal CMP to help reduce edema. Patient has tried compression stockings in the past and states they were excruciating. Elevate legs at rest, work hard on weight loss, increasing activity levels, sodium restriction      Relevant Orders   Ambulatory referral to Cardiology   Dyspnea on exertion    Unclear what is poorly controlled COPD vs cardiac etiology at this time. Will refer to Cardiology  for further evaluation as well as start on trelegy to hopefully better control COPD and asthma. Monitor closely for benefit      Relevant Orders   Ambulatory referral to Cardiology   Apnea    Not ready to go back to sleep medicine yet to complete evaluation and start on treatment. Will let us know when she is       Other Visit Diagnoses    Elevated blood sugar       Was borderline DM in past, will recheck A1C and treat as needed. Work hard on lifestyle modifications to improve control   Relevant Orders   HgB A1c   Gait instability       Relevant Orders   Referral to Chronic Care Management Services   Chest pain, unspecified type       Referral placed to Cardiology for further evaluation into her frequent anginal and DOE episodes. Continue current regimen and ER precautions given   Relevant Orders   Ambulatory referral to Cardiology       Follow up plan: Return in about 2 weeks (around 10/14/2018) for BP f/u.

## 2018-10-03 ENCOUNTER — Telehealth: Payer: Self-pay | Admitting: Family Medicine

## 2018-10-03 ENCOUNTER — Other Ambulatory Visit: Payer: Self-pay

## 2018-10-03 ENCOUNTER — Other Ambulatory Visit: Payer: Medicaid Other

## 2018-10-03 DIAGNOSIS — I10 Essential (primary) hypertension: Secondary | ICD-10-CM | POA: Diagnosis not present

## 2018-10-03 DIAGNOSIS — E039 Hypothyroidism, unspecified: Secondary | ICD-10-CM | POA: Diagnosis not present

## 2018-10-03 DIAGNOSIS — R739 Hyperglycemia, unspecified: Secondary | ICD-10-CM | POA: Diagnosis not present

## 2018-10-03 DIAGNOSIS — E785 Hyperlipidemia, unspecified: Secondary | ICD-10-CM | POA: Diagnosis not present

## 2018-10-03 DIAGNOSIS — E559 Vitamin D deficiency, unspecified: Secondary | ICD-10-CM | POA: Diagnosis not present

## 2018-10-03 NOTE — Assessment & Plan Note (Signed)
Not at goal, but very stressed right now. Will get her a home BP monitor and have her log home readings. F/u in 2 weeks for recheck. Will likely increase her lasix pending lab results to help both with BP and LE edema

## 2018-10-03 NOTE — Assessment & Plan Note (Signed)
Not ready to go back to sleep medicine yet to complete evaluation and start on treatment. Will let us know when she is

## 2018-10-03 NOTE — Assessment & Plan Note (Signed)
Recheck TSH, adjust as needed 

## 2018-10-03 NOTE — Telephone Encounter (Signed)
Pt presented in office to get labs done. Wanted to let provider know that pharmacy is waiting for Pa for trelegy also states that bp cuff provider prescribed is too small. Please advise.

## 2018-10-03 NOTE — Assessment & Plan Note (Signed)
Await establish care visit with Pain Management, continue current regimen in meantime

## 2018-10-03 NOTE — Assessment & Plan Note (Signed)
Unclear what is poorly controlled COPD vs cardiac etiology at this time. Will refer to Cardiology for further evaluation as well as start on trelegy to hopefully better control COPD and asthma. Monitor closely for benefit

## 2018-10-03 NOTE — Assessment & Plan Note (Signed)
Referral placed to psychiatry for further management, and information for walk in clinics locally given if needed. Return precautions given. Continue current regimen in meantime

## 2018-10-03 NOTE — Assessment & Plan Note (Signed)
Referral placed to Psychiatry, continue counseling virtual visits in meantime and current regimen

## 2018-10-03 NOTE — Assessment & Plan Note (Signed)
Increase gabapentin, continue remainder of regimen as prescribed by prior PCP and await Pain Management consultation

## 2018-10-03 NOTE — Assessment & Plan Note (Signed)
Will increase lasix pending normal CMP to help reduce edema. Patient has tried compression stockings in the past and states they were excruciating. Elevate legs at rest, work hard on weight loss, increasing activity levels, sodium restriction

## 2018-10-03 NOTE — Assessment & Plan Note (Signed)
Severe and debilitating per patient. Will increase her gabapentin dose to 600-900 mg daily and await Pain Management consultation. Continue current regimen as ordered by prior PCP otherwise.

## 2018-10-03 NOTE — Assessment & Plan Note (Signed)
Recheck levels 

## 2018-10-03 NOTE — Assessment & Plan Note (Signed)
Recheck labs, adjust as needed 

## 2018-10-03 NOTE — Assessment & Plan Note (Signed)
Will increase inhaler regimen, continue singulair. Monitor closely for benefit

## 2018-10-03 NOTE — Assessment & Plan Note (Signed)
Poorly controlled on dulera, not compliant with albuterol prn. Will start trelegy to help with polypharmacy and compliance and monitor closely for benefit. Smoking cessation reviewed, pt not ready to quit

## 2018-10-03 NOTE — Assessment & Plan Note (Signed)
Will provide home health orders for depends pads, may need medications to help with incontinence issues. Pelvic floor exercises reviewed

## 2018-10-04 ENCOUNTER — Other Ambulatory Visit: Payer: Self-pay | Admitting: Family Medicine

## 2018-10-04 LAB — CBC WITH DIFFERENTIAL/PLATELET
Basophils Absolute: 0.1 10*3/uL (ref 0.0–0.2)
Basos: 1 %
EOS (ABSOLUTE): 0.2 10*3/uL (ref 0.0–0.4)
Eos: 2 %
Hematocrit: 38.8 % (ref 34.0–46.6)
Hemoglobin: 12.8 g/dL (ref 11.1–15.9)
Immature Grans (Abs): 0 10*3/uL (ref 0.0–0.1)
Immature Granulocytes: 0 %
Lymphocytes Absolute: 2 10*3/uL (ref 0.7–3.1)
Lymphs: 23 %
MCH: 29.7 pg (ref 26.6–33.0)
MCHC: 33 g/dL (ref 31.5–35.7)
MCV: 90 fL (ref 79–97)
Monocytes Absolute: 0.6 10*3/uL (ref 0.1–0.9)
Monocytes: 7 %
Neutrophils Absolute: 6.1 10*3/uL (ref 1.4–7.0)
Neutrophils: 67 %
Platelets: 365 10*3/uL (ref 150–450)
RBC: 4.31 x10E6/uL (ref 3.77–5.28)
RDW: 13.1 % (ref 11.7–15.4)
WBC: 9.1 10*3/uL (ref 3.4–10.8)

## 2018-10-04 LAB — LIPID PANEL W/O CHOL/HDL RATIO
Cholesterol, Total: 156 mg/dL (ref 100–199)
HDL: 40 mg/dL (ref >39)
LDL Calculated: 86 mg/dL (ref 0–99)
Triglycerides: 148 mg/dL (ref 0–149)
VLDL Cholesterol Cal: 30 mg/dL (ref 5–40)

## 2018-10-04 LAB — COMPREHENSIVE METABOLIC PANEL
ALT: 23 IU/L (ref 0–32)
AST: 15 IU/L (ref 0–40)
Albumin/Globulin Ratio: 1.5 (ref 1.2–2.2)
Albumin: 3.8 g/dL (ref 3.8–4.9)
Alkaline Phosphatase: 76 IU/L (ref 39–117)
BUN/Creatinine Ratio: 20 (ref 9–23)
BUN: 16 mg/dL (ref 6–24)
Bilirubin Total: 0.4 mg/dL (ref 0.0–1.2)
CO2: 22 mmol/L (ref 20–29)
Calcium: 8.5 mg/dL — ABNORMAL LOW (ref 8.7–10.2)
Chloride: 103 mmol/L (ref 96–106)
Creatinine, Ser: 0.81 mg/dL (ref 0.57–1.00)
GFR calc Af Amer: 97 mL/min/{1.73_m2} (ref >59)
GFR calc non Af Amer: 84 mL/min/{1.73_m2} (ref >59)
Globulin, Total: 2.6 g/dL (ref 1.5–4.5)
Glucose: 97 mg/dL (ref 65–99)
Potassium: 4.4 mmol/L (ref 3.5–5.2)
Sodium: 142 mmol/L (ref 134–144)
Total Protein: 6.4 g/dL (ref 6.0–8.5)

## 2018-10-04 LAB — HEMOGLOBIN A1C
Est. average glucose Bld gHb Est-mCnc: 120 mg/dL
Hgb A1c MFr Bld: 5.8 % — ABNORMAL HIGH (ref 4.8–5.6)

## 2018-10-04 LAB — TSH: TSH: 2.19 u[IU]/mL (ref 0.450–4.500)

## 2018-10-04 LAB — VITAMIN D 25 HYDROXY (VIT D DEFICIENCY, FRACTURES): Vit D, 25-Hydroxy: 6.2 ng/mL — ABNORMAL LOW (ref 30.0–100.0)

## 2018-10-04 MED ORDER — VITAMIN D (ERGOCALCIFEROL) 1.25 MG (50000 UNIT) PO CAPS: 50000.0000 [IU] | ORAL_CAPSULE | ORAL | 1 refills | Status: DC

## 2018-10-05 NOTE — Telephone Encounter (Signed)
Per result note message conversation with patient, pharmacy was ordering a larger BP cuff

## 2018-10-05 NOTE — Telephone Encounter (Signed)
P.A. done via [employer] Confirmation #:[id number] W. Called [facility] and they stated that equipment is over the counter, pt bought a medium, they do have a large size. They are not able to order an [place]. Pharmacy advised that she maybe able to call around to other pharmacies to see if any of them are able to get her an [place]. Patient is aware.

## 2018-10-06 ENCOUNTER — Telehealth: Payer: Self-pay | Admitting: Family Medicine

## 2018-10-06 NOTE — Telephone Encounter (Signed)
Patient calling back to notify that her father did not have a larger size cuff. She still only has a medium.

## 2018-10-06 NOTE — Telephone Encounter (Signed)
Copied from [place] [id number]. Topic: Quick Communication - See Telephone Encounter >> Oct 06, 2018  4:57 PM Valla Leaver wrote: CRM for notification. See Telephone encounter for: 10/06/18. Patient says pharmacy told her the Fluticasone-Umeclidin-Vilant (TRELEGY ELLIPTA) 100-62.5-25 MCG/INH AEPB needs a PA. Please advise.

## 2018-10-07 NOTE — Telephone Encounter (Signed)
P.A. done via [employer] Confirmation #:[id number] W.

## 2018-10-10 ENCOUNTER — Ambulatory Visit: Payer: Self-pay | Admitting: *Deleted

## 2018-10-10 ENCOUNTER — Telehealth: Payer: Self-pay | Admitting: Family Medicine

## 2018-10-10 NOTE — Telephone Encounter (Signed)
Erroneous entry

## 2018-10-10 NOTE — Telephone Encounter (Signed)
Patient states that this medication is not covered by insurance. She is requesting a call back from CMA to discuss further, as she has additional questions regarding this medication.

## 2018-10-10 NOTE — Telephone Encounter (Signed)
Message relayed to patient. Verbalized understanding and denied questions.   

## 2018-10-10 NOTE — Telephone Encounter (Signed)
Copied from [place] [id number]. Topic: General - Other >> Oct 10, 2018  9:47 AM Mathis Bud wrote: Reason for CRM: Leafy Ro, from [facility], called stating she has sent over another fax for pt regarding a blank personal care applications.  Leafy Ro states she sent fax over twice, 09/26/2018 and 10/05/18.  Leafy Ro states she did receive a call but did not understand the voicemail. Mandy requesting confirmation. Please advise. Mandy call back # [phone]

## 2018-10-10 NOTE — Telephone Encounter (Signed)
Spoke with pt and she stated she will wait until Wednesday.

## 2018-10-10 NOTE — Telephone Encounter (Signed)
OK to schedule with in office providers ASAP

## 2018-10-10 NOTE — Telephone Encounter (Signed)
Pt reports increased swelling of both legs since seeing Ms. Lane. States pain 10/10, denies fever, states "No more red than usual." States has been elevating. Edema is of both legs, states "From hip to toes." Pt has appt Friday. Requesting earlier appt. TN transferred call to practice, Alwyn Ren, for consideration of appt.  Care advise given per protocol. Reason for Disposition . [1] MODERATE leg swelling (e.g., swelling extends up to knees) AND [2] new onset or worsening  Answer Assessment - Initial Assessment Questions 1. ONSET: "When did the swelling start?" (e.g., minutes, hours, days)     H/O worsening yesterday 2. LOCATION: "What part of the leg is swollen?"  "Are both legs swollen or just one leg?"     Both legs, from thigh to feet 3. SEVERITY: "How bad is the swelling?" (e.g., localized; mild, moderate, severe)  - Localized - small area of swelling localized to one leg  - MILD pedal edema - swelling limited to foot and ankle, pitting edema < 1/4 inch (6 mm) deep, rest and elevation eliminate most or all swelling  - MODERATE edema - swelling of lower leg to knee, pitting edema > 1/4 inch (6 mm) deep, rest and elevation only partially reduce swelling  - SEVERE edema - swelling extends above knee, facial or hand swelling present      10/10, constant 4. REDNESS: "Does the swelling look red or infected?"     Yes, not new 5. PAIN: "Is the swelling painful to touch?" If so, ask: "How painful is it?"   (Scale 1-10; mild, moderate or severe)     10/10 6. FEVER: "Do you have a fever?" If so, ask: "What is it, how was it measured, and when did it start?"      no 7. CAUSE: "What do you think is causing the leg swelling?"     8. MEDICAL HISTORY: "Do you have a history of heart failure, kidney disease, liver failure, or cancer?"     9. RECURRENT SYMPTOM: "Have you had leg swelling before?" If so, ask: "When was the last time?" "What happened that time?"     H/O leg swelling , neuropathy 10. OTHER  SYMPTOMS: "Do you have any other symptoms?" (e.g., chest pain, difficulty breathing)     no  Protocols used: LEG SWELLING AND EDEMA-A-AH

## 2018-10-10 NOTE — Telephone Encounter (Signed)
Fax was received, only spoke with patient about possibly requesting depends for incontinence issues during initial appointment as there was so much to cover immediately so was hoping to complete form at her follow up this week to get more information about what she is needing. If Cheryl Chandler has more details regarding this and the patient's needs that would be great

## 2018-10-11 ENCOUNTER — Other Ambulatory Visit: Payer: Self-pay

## 2018-10-11 ENCOUNTER — Ambulatory Visit: Payer: Self-pay | Admitting: Licensed Clinical Social Worker

## 2018-10-11 ENCOUNTER — Ambulatory Visit: Payer: Self-pay | Admitting: Pharmacist

## 2018-10-11 DIAGNOSIS — R0609 Other forms of dyspnea: Secondary | ICD-10-CM

## 2018-10-11 DIAGNOSIS — I10 Essential (primary) hypertension: Secondary | ICD-10-CM

## 2018-10-11 DIAGNOSIS — F331 Major depressive disorder, recurrent, moderate: Secondary | ICD-10-CM

## 2018-10-11 NOTE — Chronic Care Management (AMB) (Signed)
  Care Management   Follow Up Note   10/11/2018 Name: Cheryl Chandler MRN: [id number] DOB: 1966-02-26  Referred by: [name], PA-C Reason for referral : Chronic Care Management   Cheryl Chandler is a 53 y.o. year old female who is a primary care patient of [name], PA-C. The care management team was consulted for assistance with care management and care coordination needs.    Review of patient status, including review of consultants reports, relevant laboratory and other test results, and collaboration with appropriate care team members and the patient's provider was performed as part of comprehensive patient evaluation and provision of chronic care management services.    Goals Addressed    . "I'm having a hard time processing the recent loss of my mother" (pt-stated)       Current Barriers:  . Financial constraints . Mental Health Concerns  . Social Isolation . Limited education about mental health support resources that are available to her within the local area* . Cognitive Deficits . Lacks knowledge of community resource: Grief support resources/programs that are offered free of charge  within the area  Clinical Social Work Clinical Goal(s):  [name] Over the next 90 days, client will work with SW to address concerns related to experiencing ongoing symptoms of depression, sadness and grief since the passing of her mother . Over the next 90 days, patient will work with LCSW to address needs related to implementing appropriate self-care and depression management.   Interventions: . Patient interviewed and appropriate assessments performed . Provided mental health counseling with regards to patient's recent passing of her mother. Patient reports being active with [facility]. She shares that she has been in contact with a caseworker there that has spent time with her on the phone and has sent her resource information on available mental  health support resources within the area. Patient denies wanting LCSW to make a referral for grief counseling through [facility] at this time but was receptive to coping skill and resource education/information that was provided during session today. . Provided patient with information about managing grief and depression within her daily life. Patient reports that she talks out loud to her mother's picture and this is very helpful to her. . Provided reflective listening and implemented appropriate interventions to help suppport patient and her emotional needs  . Discussed plans with patient for ongoing care management follow up and provided patient with direct contact information for care management team . Advised patient to contact CCM team with any urgent case management needs. . Assisted patient/caregiver with obtaining information about health plan benefits  Patient Self Care Activities:  . Attends all scheduled provider appointments . Calls provider office for new concerns or questions  Initial goal documentation     The care management team will reach out to the patient again over the next 30 days.    Eula Fried, BSW, MSW, [facility] Care Management [email or web address] Phone: [phone]

## 2018-10-11 NOTE — Chronic Care Management (AMB) (Signed)
Chronic Care Management   Note  10/11/2018 Name: Cheryl Chandler MRN: [id number] DOB: 10/11/1965   Subjective:  Cheryl Chandler is a 53 y.o. year old female who is a primary care patient of Particia Chandler, [name], [place]. The CCM team was consulted for assistance with chronic disease management and care coordination needs.    Contacted patient today to discuss chronic care management team and conduct medication review.   Review of patient status, including review of consultants reports, laboratory and other test data, was performed as part of comprehensive evaluation and provision of chronic care management services.   Objective:  Lab Results  Component Value Date   CREATININE 0.81 10/03/2018   CREATININE 0.75 09/15/2018   CREATININE 0.84 08/07/2018    Lab Results  Component Value Date   HGBA1C 5.8 (H) 10/03/2018       Component Value Date/Time   CHOL 156 10/03/2018 1400   TRIG 148 10/03/2018 1400   HDL 40 10/03/2018 1400   LDLCALC 86 10/03/2018 1400    Clinical ASCVD: No - though patient unsure The 10-year ASCVD risk score Denman George(Goff DC Jr., et al., 2013) is: 7.6%   Values used to calculate the score:     Age: [age] years     Sex: Female     Is Non-Hispanic African American: No     Diabetic: No     Tobacco smoker: Yes     Systolic Blood Pressure: 149 mmHg     Is BP treated: Yes     HDL Cholesterol: 40 mg/dL     Total Cholesterol: 156 mg/dL    BP Readings from Last 3 Encounters:  09/30/18 (!) 149/68  09/15/18 (!) 123/52  08/16/18 (!) 126/50    Allergies  Allergen Reactions  . Codeine Shortness Of Breath and Rash  . Percocet [Oxycodone-Acetaminophen] Shortness Of Breath  . Sulfa Antibiotics Shortness Of Breath and Rash  . Aspirin Hives  . Bee Venom     Bee stings  . Darvon [Propoxyphene]     Darvocet-N 100  . Latex   . Oxycodone Nausea And Vomiting  . Penicillins     Has patient had a PCN reaction causing immediate rash, facial/tongue/throat  swelling, SOB or lightheadedness with hypotension: Unknown Has patient had a PCN reaction causing severe rash involving mucus membranes or skin necrosis: Unknown Has patient had a PCN reaction that required hospitalization: Unknown Has patient had a PCN reaction occurring within the last 10 years: Unknown If all of the above answers are "NO", then may proceed with Cephalosporin use.    Medications Reviewed Today    Reviewed by Lourena Simmondsravis, Kenlynn Houde E, [facility] Healthcare CenterRPH (Pharmacist) on 10/11/18 at 1002  Med List Status: <None>  Medication Order Taking? Sig Documenting Provider Last Dose Status Informant  albuterol (PROVENTIL) (2.5 MG/3ML) 0.083% nebulizer solution [id number] Yes INHALE CONTENTS OF 1 VIAL IN NEBULIZER EVERY 4 HOURS IF NEEDED FOR WHEEZING OR SHORTNESS OF BREATH. Malva LimesFisher, Donald E, MD Taking Active            Med Note Lourena Simmonds(Hether Anselmo E   Tue Oct 11, 2018  9:44 AM) Four times daily   citalopram (CELEXA) 20 MG tablet [id number] Yes TAKE 1 TABLET BY MOUTH ONCE DAILY. Malva LimesFisher, Donald E, MD Taking Active   DULERA 100-5 MCG/ACT AERO [id number] Yes INHALE 2 PUFFS BY MOUTH TWICE A DAY. RINSE MOUTH AFTER USE. Malva LimesFisher, Donald E, MD Taking Active   EPINEPHrine 0.3 mg/0.3 mL IJ SOAJ injection [id number] No USE AS DIRECTED.  Patient  not taking: Reported on 10/11/2018   Malva LimesFisher, Donald E, MD Not Taking Active   Fluticasone-Umeclidin-Vilant [facility](TRELEGY ELLIPTA) 100-62.5-25 MCG/INH AEPB [id number] No Inhale 1 puff into the lungs daily.  Patient not taking: Reported on 10/11/2018   Particia Chandler, [name], PA-C Not Taking Active   furosemide (LASIX) 20 MG tablet [id number] Yes TAKE 1 TABLET BY MOUTH ONCE DAILY Malva LimesFisher, Donald E, MD Taking Active   gabapentin (NEURONTIN) 300 MG capsule [id number] Yes Take 1 capsule (300 mg total) by mouth 3 (three) times daily as needed. Particia Chandler, [name], PA-C Taking Active            Med Note Lourena Simmonds(Ryot Burrous E   Tue Oct 11, 2018  9:50 AM) Just using QHS  levothyroxine (SYNTHROID) 50  MCG tablet [id number] Yes TAKE 1 TABLET BY MOUTH ONCE DAILY ON AN EMPTY STOMACH. WAIT 30 MINUTES BEFORE TAKING OTHER MEDS. Malva LimesFisher, Donald E, MD Taking Active   lidocaine (XYLOCAINE) 2 % solution [id number]  Use as directed 10 mLs in the mouth or throat as needed for mouth pain. Anola Gurneyhauvin, Robert, PA  Active   lisinopril (PRINIVIL,ZESTRIL) 20 MG tablet [id number] Yes TAKE 1 TABLET BY MOUTH ONCE DAILY Malva LimesFisher, Donald E, MD Taking Active   meloxicam (MOBIC) 15 MG tablet [id number] Yes TAKE 1 TABLET BY MOUTH ONCE A DAY WITH FOOD OR MILK IF NEEDE Malva LimesFisher, Donald E, MD Taking Active            Med Note Lourena Simmonds(Marwa Fuhrman E   Tue Oct 11, 2018  9:51 AM) QAM  methocarbamol (ROBAXIN) 750 MG tablet [id number] Yes TAKE 1-2 TABLETS BY MOUTH 3 TIMES DAILY IF NEEDED FOR BACK PAIN. Malva LimesFisher, Donald E, MD Taking Active            Med Note Feliz Beam(Delshon Blanchfield, Arie SabinaCATHERINE E   Tue Oct 11, 2018  9:50 AM) 2 pills QAM  montelukast (SINGULAIR) 10 MG tablet [id number] Yes TAKE 1 TABLET BY MOUTH ONCE DAILY. Malva LimesFisher, Donald E, MD Taking Active   nitroGLYCERIN (NITROSTAT) 0.4 MG SL tablet [id number] No DISSOLVE 1 TABLET UNDER TONGUE AT ONSET OF CHEST PAIN. REPEAT IN 5 MIN IF NOT RESOLVED, MAX 3 DOSES. 911 IF NEEDED.  Patient not taking: Reported on 10/11/2018   Malva LimesFisher, Donald E, MD Not Taking Active   omeprazole (PRILOSEC) 20 MG capsule [id number] Yes TAKE 1 CAPSULE BY MOUTH ONCE DAILY. Malva LimesFisher, Donald E, MD Taking Active   PROAIR HFA 108 [id number](90 Base) MCG/ACT inhaler [id number] Yes inhale 2 puffs by mouth every 4 to 6 hours if needed for wheezing Malva LimesFisher, Donald E, MD Taking Active Self  simvastatin (ZOCOR) 40 MG tablet [id number] Yes TAKE 1 TABLET BY MOUTH ONCE DAILY Malva LimesFisher, Donald E, MD Taking Active   traMADol (ULTRAM) 50 MG tablet [id number] Yes TAKE 1 TABLET BY MOUTH EVERY 8 HOURS AS NEEDED PAIN Malva LimesFisher, Donald E, MD Taking Active            Med Note Feliz Beam(Reyaansh Merlo, Arie SabinaCATHERINE E   Tue Oct 11, 2018  9:51 AM) QAM  Vitamin D, Ergocalciferol, (DRISDOL) 1.25 MG (50000  UT) CAPS capsule [id number] Yes Take 1 capsule (50,000 Units total) by mouth every 7 (seven) days. Particia Chandler, [name], [place]-C Taking Active            Assessment:   Goals Addressed            This Visit's Progress     Patient Stated   . "I have depression, anxiety, and chronic pain" (pt-stated)  Current Barriers:  . Uncontrolled mental health disorders, patient mentions a hx of being treated at "[facility]", along with diagnoses of PTSD and OCD; has been managed on citalopram 20 mg daily for years. Notes continued depression and anxiety symptoms, as well as problems sleeping  . Also notes a history of chronic neuropathic pain; currently taking tramadol 50 mg + meloxicam 15 mg QAM, gabapentin 300 mg QPM daily.  . Notes reduced use of her legs with weakness, which she think is contributing to weight gain, which is contributing to breathing concerns o Previously reported to Merrie Roof, [place] that she may have had a history of a heart attack during a hospitalization and "fluid around my heart", though today clarifies that she doesn't remember much from that hospitalization o At last visit, Merrie Roof, PA placed referrals to psychiatry and pain management o Reported multiple family deaths recently, as well has her husband having a history of brain damage; she notes feeling like a burden to her husband . Denies hx use of Cymbalta  Pharmacist Clinical Goal(s):  [name] Over the next 90 days, patient will work with PharmD and primary care provider to address needs related to appropriate mental health and pain management  Interventions: . Comprehensive medication review performed. . Will collaborate with RN CM Janci Minor to help connect patient to Psychiatry/Pain Management . Unsure if patient would qualify/benefit from PT/OT; will collaborate with primary care provider and RN CM for these support services as well.  . Consider switching citalopram to duloxetine for dual benefit on mood and  chronic neuropathic pain . Unsure if patient would qualify for PT/OT as well   Patient Self Care Activities:  . Self administers medications as prescribed . Calls provider office for new concerns or questions  Initial goal documentation     . "I want to take care of my heart" (pt-stated)       Current Barriers:  [name] Knowledge Deficits related to hx of cardiovascular disease ; dx of hypertension and hyperlipidemia; notes she had a prolonged hospitalization where she was told she had fluid around her heart, but may have been told a heart attack or stroke. She and her husband are unsure of this . Endorses lower extremity edema that she contacted clinic about, has appointment scheduled tomorrow  . HTN tx: lisinopril 20 mg daily, furosemide 20 mg daily; notes she has a BP meter and a large cuff at home, but keeps getting error messages when she tries to use it . HLD tx: simvastatin 40 mg daily; Last lipid panel 10/04/2018: LDL 89, HDL 40, TG 148, TC 156  Pharmacist Clinical Goal(s):  [name] Over the next 90 days, patient will work with PharmD and primary care provider to address needs related to optimal cardiovascular risk reduction  Interventions: . Could consider changing to high intensity statin to target LDL <70.  . Will support patient with tobacco cessation strategies moving forward . Will help patient troubleshoot blood pressure meter tomorrow  Patient Self Care Activities:  . Self administers medications as prescribed . Calls provider office for new concerns or questions  Initial goal documentation     . "I'm having a hard time breathing" (pt-stated)       Current Barriers:  . 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 Currently managed on Dulera 100/5 mcg 2 puffs BID, montelukast 10 mg QHS, and using albuterol nebulizer QID.  o Trelegy prescribed, however, PA denied on [place] Medicaid o Also suffers from unmanaged sleep  apnea. She states she is only able  to get about 2-3 hours of sleep a night and is only comfortable sleeping in recliner.    Pharmacist Clinical Goal(s):  [name]. Over the next 14 days, patient will work with PharmD and primary care provider to address needs related to optimized medication management  Interventions: . Comprehensive medication review performed. . Consider addition of Spiriva Respimat to [facility] to target triple therapy. Spiriva preferred on [place] Medicaid. . Will collaborate with CCM RN colleague regarding pursuing sleep study and potential eventual CPAP needs . Consider PFTs to most accurately characterize patient's lung disease  . In the future, will continue to discuss smoking cessation and offer support to patient  Patient Self Care Activities:  . Self administers medications as prescribed . Calls provider office for new concerns or questions  Initial goal documentation        Plan: - Will meet with paitent tomorrow during primary care provider appointment to troubleshoot blood pressure meter - Will outreach patient in 3-4 weeks for continued medication management support   Catie Feliz Beamravis, PharmD Clinical Pharmacist [facility] [phone]

## 2018-10-11 NOTE — Patient Instructions (Signed)
Visit Information  Goals Addressed            This Visit's Progress     Patient Stated   . "I have depression, anxiety, and chronic pain" (pt-stated)       Current Barriers:  . Uncontrolled mental health disorders, patient mentions a hx of being treated at "[facility]", along with diagnoses of PTSD and OCD; has been managed on citalopram 20 mg daily for years. Notes continued depression and anxiety symptoms, as well as problems sleeping  . Also notes a history of chronic neuropathic pain; currently taking tramadol 50 mg + meloxicam 15 mg QAM, gabapentin 300 mg QPM daily.  . Notes reduced use of her legs with weakness, which she think is contributing to weight gain, which is contributing to breathing concerns o Previously reported to Merrie Roof, [place] that she may have had a history of a heart attack during a hospitalization and "fluid around my heart", though today clarifies that she doesn't remember much from that hospitalization o At last visit, Merrie Roof, PA placed referrals to psychiatry and pain management o Reported multiple family deaths recently, as well has her husband having a history of brain damage; she notes feeling like a burden to her husband . Denies hx use of Cymbalta  Pharmacist Clinical Goal(s):  [name] Over the next 90 days, patient will work with PharmD and primary care provider to address needs related to appropriate mental health and pain management  Interventions: . Comprehensive medication review performed. . Will collaborate with RN CM Janci Minor to help connect patient to Psychiatry/Pain Management . Unsure if patient would qualify/benefit from PT/OT; will collaborate with primary care provider and RN CM for these support services as well.  . Consider switching citalopram to duloxetine for dual benefit on mood and chronic neuropathic pain . Unsure if patient would qualify for PT/OT as well   Patient Self Care Activities:  . Self administers medications as  prescribed . Calls provider office for new concerns or questions  Initial goal documentation     . "I want to take care of my heart" (pt-stated)       Current Barriers:  [name] Knowledge Deficits related to hx of cardiovascular disease ; dx of hypertension and hyperlipidemia; notes she had a prolonged hospitalization where she was told she had fluid around her heart, but may have been told a heart attack or stroke. She and her husband are unsure of this . Endorses lower extremity edema that she contacted clinic about, has appointment scheduled tomorrow  . HTN tx: lisinopril 20 mg daily, furosemide 20 mg daily; notes she has a BP meter and a large cuff at home, but keeps getting error messages when she tries to use it . HLD tx: simvastatin 40 mg daily; Last lipid panel 10/04/2018: LDL 89, HDL 40, TG 148, TC 156  Pharmacist Clinical Goal(s):  [name] Over the next 90 days, patient will work with PharmD and primary care provider to address needs related to optimal cardiovascular risk reduction  Interventions: . Could consider changing to high intensity statin to target LDL <70.  . Will support patient with tobacco cessation strategies moving forward . Will help patient troubleshoot blood pressure meter tomorrow  Patient Self Care Activities:  . Self administers medications as prescribed . Calls provider office for new concerns or questions  Initial goal documentation     . "I'm having a hard time breathing" (pt-stated)       Current Barriers:  . 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 Currently managed on Dulera 100/5 mcg 2 puffs BID, montelukast 10 mg QHS, and using albuterol nebulizer QID.  o Trelegy prescribed, however, PA denied on [place] [employer] o Also suffers from unmanaged sleep apnea. She states she is only able to get about 2-3 hours of sleep a night and is only comfortable sleeping in recliner.    Pharmacist Clinical Goal(s):  [name]. Over the next 14 days,  patient will work with PharmD and primary care provider to address needs related to optimized medication management  Interventions: . Comprehensive medication review performed. . Consider addition of Spiriva Respimat to [facility] to target triple therapy. Spiriva preferred on [place] Medicaid. . Will collaborate with CCM RN colleague regarding pursuing sleep study and potential eventual CPAP needs . Consider PFTs to most accurately characterize patient's lung disease  . In the future, will continue to discuss smoking cessation and offer support to patient  Patient Self Care Activities:  . Self administers medications as prescribed . Calls provider office for new concerns or questions  Initial goal documentation        The patient verbalized understanding of instructions provided today and declined a print copy of patient instruction materials.   Plan: - Will meet with paitent tomorrow during primary care provider appointment to troubleshoot blood pressure meter - Will outreach patient in 3-4 weeks for continued medication management support   Catie Feliz Beamravis, PharmD Clinical Pharmacist [facility] [phone]

## 2018-10-12 ENCOUNTER — Encounter: Payer: Self-pay | Admitting: Family Medicine

## 2018-10-12 ENCOUNTER — Ambulatory Visit (INDEPENDENT_AMBULATORY_CARE_PROVIDER_SITE_OTHER): Payer: Medicaid Other | Admitting: Family Medicine

## 2018-10-12 ENCOUNTER — Ambulatory Visit: Payer: Self-pay | Admitting: Pharmacist

## 2018-10-12 ENCOUNTER — Ambulatory Visit: Payer: Self-pay | Admitting: *Deleted

## 2018-10-12 ENCOUNTER — Other Ambulatory Visit: Payer: Self-pay

## 2018-10-12 VITALS — BP 145/68 | HR 71 | Temp 99.0°F

## 2018-10-12 DIAGNOSIS — Z1211 Encounter for screening for malignant neoplasm of colon: Secondary | ICD-10-CM

## 2018-10-12 DIAGNOSIS — G629 Polyneuropathy, unspecified: Secondary | ICD-10-CM | POA: Diagnosis not present

## 2018-10-12 DIAGNOSIS — R531 Weakness: Secondary | ICD-10-CM | POA: Diagnosis not present

## 2018-10-12 DIAGNOSIS — J449 Chronic obstructive pulmonary disease, unspecified: Secondary | ICD-10-CM | POA: Diagnosis not present

## 2018-10-12 DIAGNOSIS — R296 Repeated falls: Secondary | ICD-10-CM | POA: Diagnosis not present

## 2018-10-12 DIAGNOSIS — R0609 Other forms of dyspnea: Secondary | ICD-10-CM

## 2018-10-12 DIAGNOSIS — I10 Essential (primary) hypertension: Secondary | ICD-10-CM

## 2018-10-12 DIAGNOSIS — Z1239 Encounter for other screening for malignant neoplasm of breast: Secondary | ICD-10-CM | POA: Diagnosis not present

## 2018-10-12 DIAGNOSIS — R600 Localized edema: Secondary | ICD-10-CM | POA: Diagnosis not present

## 2018-10-12 MED ORDER — SPIRIVA HANDIHALER 18 MCG IN CAPS: 18.0000 ug | ORAL_CAPSULE | Freq: Every day | RESPIRATORY_TRACT | 12 refills | Status: DC

## 2018-10-12 MED ORDER — FUROSEMIDE 40 MG PO TABS: 40.0000 mg | ORAL_TABLET | Freq: Every day | ORAL | 0 refills | Status: DC

## 2018-10-12 NOTE — Chronic Care Management (AMB) (Signed)
Chronic Care Management   Follow Up Note   10/12/2018 Name: Neave Lenger MRN: [id number] DOB: 1965-11-23  Referred by: [name], PA-C Reason for referral : No chief complaint on file.   Tyana Kinslee Dalpe is a 53 y.o. year old female who is a primary care patient of [name], [place]. The CCM team was consulted for assistance with chronic disease management and care coordination needs.    Met with patient face to face during primary care provider appointment with RN CM.   Review of patient status, including review of consultants reports, relevant laboratory and other test results, and collaboration with appropriate care team members and the patient's provider was performed as part of comprehensive patient evaluation and provision of chronic care management services.    Goals Addressed            This Visit's Progress     Patient Stated   . "I want to take care of my heart" (pt-stated)       Current Barriers:  [name] Knowledge Deficits related to hx of cardiovascular disease ; dx of hypertension and hyperlipidemia;  . Endorses lower extremity edema today; examined by Merrie Roof, PA today  . HTN tx: lisinopril 20 mg daily, furosemide 20 mg daily; notes she has a BP meter and a large cuff at home, but keeps getting error messages when she tries to use it . HLD tx: simvastatin 40 mg daily; Last lipid panel 10/04/2018: LDL 89, HDL 40, TG 148, TC 156  Pharmacist Clinical Goal(s):  [name] Over the next 90 days, patient will work with PharmD and primary care provider to address needs related to optimal cardiovascular risk reduction  Interventions: . Helped patient set up blood pressure meter and ensured she knew how to use. She plans to monitor regularly . Will support patient with tobacco cessation strategies moving forward  Patient Self Care Activities:  . Self administers medications as prescribed . Calls provider office for new concerns or questions   Please see past updates related to this goal by clicking on the "Past Updates" button in the selected goal      . "I'm having a hard time breathing" (pt-stated)       Current Barriers:  . 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 Currently managed on Dulera 100/5 mcg 2 puffs BID, montelukast 10 mg QHS, and using albuterol nebulizer QID.  o Trelegy prescribed, however, PA denied on [place] Medicaid o Also suffers from unmanaged sleep apnea. She states she is only able to get about 2-3 hours of sleep a night and is only comfortable sleeping in recliner.    Pharmacist Clinical Goal(s):  [name] Over the next 14 days, patient will work with PharmD and primary care provider to address needs related to optimized medication management  Interventions: . Collaborated with Merrie Roof, PA; sending in [place] script today to [facility] to [facility]. Patient understanding on triple therapy.  . Will collaborate with CCM RN colleague regarding pursuing sleep study and potential eventual CPAP needs . Consider PFTs to most accurately characterize patient's lung disease  . In the future, will continue to discuss smoking cessation and offer support to patient  Patient Self Care Activities:  . Self administers medications as prescribed . Calls provider office for new concerns or questions  Please see past updates related to this goal by clicking on the "Past Updates" button in the selected goal          Plan:  -  Will follow up with patient in 2-3 weeks for continued medication management support   [name], PharmD Clinical Pharmacist [place] [phone]

## 2018-10-12 NOTE — Patient Instructions (Signed)
Please call at this number to schedule your mammogram: [phone]

## 2018-10-12 NOTE — Patient Instructions (Signed)
Visit Information  Goals      Patient Stated   . "I have depression, anxiety, and chronic pain" (pt-stated)     Current Barriers:  . Uncontrolled mental health disorders, patient mentions a hx of being treated at "[facility]", along with diagnoses of PTSD and OCD; has been managed on citalopram 20 mg daily for years. Notes continued depression and anxiety symptoms, as well as problems sleeping  . Also notes a history of chronic neuropathic pain; currently taking tramadol 50 mg + meloxicam 15 mg QAM, gabapentin 300 mg QPM daily.  . Notes reduced use of her legs with weakness, which she think is contributing to weight gain, which is contributing to breathing concerns o Previously reported to [place], PA that she may have had a history of a heart attack during a hospitalization and "fluid around my heart", though today clarifies that she doesn't remember much from that hospitalization o At last visit, Roosvelt Maserachel Lane, PA placed referrals to psychiatry and pain management o Reported multiple family deaths recently, as well has her husband having a history of brain damage; she notes feeling like a burden to her husband . Denies hx use of Cymbalta  Pharmacist Clinical Goal(s):  [name]. Over the next 90 days, patient will work with PharmD and primary care provider to address needs related to appropriate mental health and pain management  Interventions: . Comprehensive medication review performed. . Will collaborate with RN CM Janci Minor to help connect patient to Psychiatry/Pain Management . Unsure if patient would qualify/benefit from PT/OT; will collaborate with primary care provider and RN CM for these support services as well.  . Consider switching citalopram to duloxetine for dual benefit on mood and chronic neuropathic pain . Unsure if patient would qualify for PT/OT as well   Patient Self Care Activities:  . Self administers medications as prescribed . Calls provider office for new concerns or  questions  Initial goal documentation     . "I want to take care of my heart" (pt-stated)     Current Barriers:  [name]. Knowledge Deficits related to hx of cardiovascular disease ; dx of hypertension and hyperlipidemia;  . Endorses lower extremity edema today; examined by Roosvelt Maserachel Lane, PA today  . HTN tx: lisinopril 20 mg daily, furosemide 20 mg daily; notes she has a BP meter and a large cuff at home, but keeps getting error messages when she tries to use it . HLD tx: simvastatin 40 mg daily; Last lipid panel 10/04/2018: LDL 89, HDL 40, TG 148, TC 156  Pharmacist Clinical Goal(s):  [name]. Over the next 90 days, patient will work with PharmD and primary care provider to address needs related to optimal cardiovascular risk reduction  Interventions: . Helped patient set up blood pressure meter and ensured she knew how to use. She plans to monitor regularly . Will support patient with tobacco cessation strategies moving forward  Patient Self Care Activities:  . Self administers medications as prescribed . Calls provider office for new concerns or questions  Please see past updates related to this goal by clicking on the "Past Updates" button in the selected goal      . "I'm having a hard time breathing" (pt-stated)     Current Barriers:  . 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 Currently managed on Dulera 100/5 mcg 2 puffs BID, montelukast 10 mg QHS, and using albuterol nebulizer QID.  o Trelegy prescribed, however, PA denied on [place] Medicaid o Also suffers from  unmanaged sleep apnea. She states she is only able to get about 2-3 hours of sleep a night and is only comfortable sleeping in recliner.    Pharmacist Clinical Goal(s):  [name] Over the next 14 days, patient will work with PharmD and primary care provider to address needs related to optimized medication management  Interventions: . Collaborated with Merrie Roof, PA; sending in [place] script today to add  to [facility]. Patient understanding on triple therapy.  . Will collaborate with CCM RN colleague regarding pursuing sleep study and potential eventual CPAP needs . Consider PFTs to most accurately characterize patient's lung disease  . In the future, will continue to discuss smoking cessation and offer support to patient  Patient Self Care Activities:  . Self administers medications as prescribed . Calls provider office for new concerns or questions  Please see past updates related to this goal by clicking on the "Past Updates" button in the selected goal      . "I'm having a hard time processing the recent loss of my mother" (pt-stated)     Current Barriers:  . Financial constraints . Mental Health Concerns  . Social Isolation . Limited education about mental health support resources that are available to her within the local area* . Cognitive Deficits . Lacks knowledge of community resource: Grief support resources/programs that are offered free of charge  within the area  Clinical Social Work Clinical Goal(s):  [name] Over the next 90 days, client will work with SW to address concerns related to experiencing ongoing symptoms of depression, sadness and grief since the passing of her mother . Over the next 90 days, patient will work with LCSW to address needs related to implementing appropriate self-care and depression management.   Interventions: . Patient interviewed and appropriate assessments performed . Provided mental health counseling with regards to patient's recent passing of her mother. Patient reports being active with [facility]. She shares that she has been in contact with a caseworker there that has spent time with her on the phone and has sent her resource information on available mental health support resources within the area. Patient denies wanting LCSW to make a referral for grief counseling through [facility] at this time but was receptive to coping skill and  resource education/information that was provided during session today. . Provided patient with information about managing grief and depression within her daily life. Patient reports that she talks out loud to her mother's picture and this is very helpful to her. . Provided reflective listening and implemented appropriate interventions to help suppport patient and her emotional needs  . Discussed plans with patient for ongoing care management follow up and provided patient with direct contact information for care management team . Advised patient to contact CCM team with any urgent case management needs. . Assisted patient/caregiver with obtaining information about health plan benefits  Patient Self Care Activities:  . Attends all scheduled provider appointments . Calls provider office for new concerns or questions  Initial goal documentation        The patient verbalized understanding of instructions provided today and declined a print copy of patient instruction materials.   Plan:  - Will follow up with patient in 2-3 weeks for continued medication management support   [name], PharmD Clinical Pharmacist [place] [phone]

## 2018-10-12 NOTE — Progress Notes (Signed)
BP (!) 145/68   Pulse 71   Temp 99 F (37.2 C) (Oral)   SpO2 95%    Subjective:    Patient ID: Cheryl Chandler, female    DOB: 10/07/1965, 53 y.o.   MRN: [id number]  HPI: Cheryl Chandler is a 53 y.o. female  Chief Complaint  Patient presents with  . Hypertension    f/u  . Foot Swelling    bilateral   Here today for 2 week HTN and LE edema f/u. Was started on 20 mg lasix at last visit, which she is tolerating well but does not note a major benefit to her edema or BPs. Still running high consistently when checked, 140s-160s/70s-80s. Has not noticed a major benefit with her SOB at this time, either. Trying to eat low sodium. Unable to wear compression hose due to leg pains. Does have intermittent angina and nearly constant SOB particularly with exertion. Awaiting Cardiology f/u for further management of these.   Trelegy was not covered by her insurance. Currently on dulera and albuterol regimen for her COPD and asthma. Still having chest tightness and SOB fairly frequently.   Taking gabapentin for her peripheral neuropathy. Dose was increased to 300 mg TID prn from 200 mg QHS at last visit, patient states she has not yet changed her bottle over to start the new dose but that the medicine does help some.   Has significant needs around the home. Husband having to perform most ADLs for her, including helping with showers, dressing, cooking. In need of multiple home devices, including a shower chair, a ramp, and a new wheelchair. Does not get around well at all and falling frequently due to weakness and legs locking up on her. Husband states they need help at home.   Relevant past medical, surgical, family and social history reviewed and updated as indicated. Interim medical history since our last visit reviewed. Allergies and medications reviewed and updated.  Review of Systems  Per HPI unless specifically indicated above     Objective:    BP (!) 145/68   Pulse 71    Temp 99 F (37.2 C) (Oral)   SpO2 95%   Wt Readings from Last 3 Encounters:  09/15/18 300 lb (136.1 kg)  08/16/18 300 lb (136.1 kg)  08/07/18 300 lb (136.1 kg)    Physical Exam Vitals signs and nursing note reviewed.  Constitutional:      Appearance: She is obese. She is not ill-appearing.  HENT:     Head: Atraumatic.  Eyes:     Extraocular Movements: Extraocular movements intact.     Conjunctiva/sclera: Conjunctivae normal.  Neck:     Musculoskeletal: Normal range of motion and neck supple.  Cardiovascular:     Rate and Rhythm: Normal rate and regular rhythm.     Heart sounds: Normal heart sounds.  Pulmonary:     Effort: Pulmonary effort is normal.     Breath sounds: Normal breath sounds.  Musculoskeletal:        General: Swelling (significant but stable b/l LE edema, 2+ pitting) and tenderness (b/l LEs) present.     Comments: In wheelchair  Skin:    General: Skin is warm and dry.     Findings: Erythema (rubor b/l LEs) present.  Neurological:     Mental Status: She is alert and oriented to person, place, and time.  Psychiatric:        Behavior: Behavior normal.        Thought Content: Thought content  normal.        Judgment: Judgment normal.     Results for orders placed or performed in visit on 09/30/18  CBC with Differential/Platelet  Result Value Ref Range   WBC 9.1 3.4 - 10.8 x10E3/uL   RBC 4.31 3.77 - 5.28 x10E6/uL   Hemoglobin 12.8 11.1 - 15.9 g/dL   Hematocrit 16.138.8 09.634.0 - 46.6 %   MCV 90 79 - 97 fL   MCH 29.7 26.6 - 33.0 pg   MCHC 33.0 31.5 - 35.7 g/dL   RDW 04.513.1 40.911.7 - 81.115.4 %   Platelets 365 150 - 450 x10E3/uL   Neutrophils 67 Not Estab. %   Lymphs 23 Not Estab. %   Monocytes 7 Not Estab. %   Eos 2 Not Estab. %   Basos 1 Not Estab. %   Neutrophils Absolute 6.1 1.4 - 7.0 x10E3/uL   Lymphocytes Absolute 2.0 0.7 - 3.1 x10E3/uL   Monocytes Absolute 0.6 0.1 - 0.9 x10E3/uL   EOS (ABSOLUTE) 0.2 0.0 - 0.4 x10E3/uL   Basophils Absolute 0.1 0.0 - 0.2  x10E3/uL   Immature Granulocytes 0 Not Estab. %   Immature Grans (Abs) 0.0 0.0 - 0.1 x10E3/uL  Comprehensive metabolic panel  Result Value Ref Range   Glucose 97 65 - 99 mg/dL   BUN 16 6 - 24 mg/dL   Creatinine, Ser 9.140.81 0.57 - 1.00 mg/dL   GFR calc non Af Amer 84 >59 mL/min/1.73   GFR calc Af Amer 97 >59 mL/min/1.73   BUN/Creatinine Ratio 20 9 - 23   Sodium 142 134 - 144 mmol/L   Potassium 4.4 3.5 - 5.2 mmol/L   Chloride 103 96 - 106 mmol/L   CO2 22 20 - 29 mmol/L   Calcium 8.5 (L) 8.7 - 10.2 mg/dL   Total Protein 6.4 6.0 - 8.5 g/dL   Albumin 3.8 3.8 - 4.9 g/dL   Globulin, Total 2.6 1.5 - 4.5 g/dL   Albumin/Globulin Ratio 1.5 1.2 - 2.2   Bilirubin Total 0.4 0.0 - 1.2 mg/dL   Alkaline Phosphatase 76 39 - 117 IU/L   AST 15 0 - 40 IU/L   ALT 23 0 - 32 IU/L  Lipid Panel w/o Chol/HDL Ratio  Result Value Ref Range   Cholesterol, Total 156 100 - 199 mg/dL   Triglycerides 782148 0 - 149 mg/dL   HDL 40 >95>39 mg/dL   VLDL Cholesterol Cal 30 5 - 40 mg/dL   LDL Calculated 86 0 - 99 mg/dL  TSH  Result Value Ref Range   TSH 2.190 0.450 - 4.500 uIU/mL  HgB A1c  Result Value Ref Range   Hgb A1c MFr Bld 5.8 (H) 4.8 - 5.6 %   Est. average glucose Bld gHb Est-mCnc 120 mg/dL  Vitamin D (25 hydroxy)  Result Value Ref Range   Vit D, 25-Hydroxy 6.2 (L) 30.0 - 100.0 ng/mL      Assessment & Plan:   Problem List Items Addressed This Visit      Cardiovascular and Mediastinum   Essential hypertension - Primary    Increase lasix to 40 mg and recheck in 2 weeks. Recently acquired home BP cuff, log readings and call with persistent abnormal readings. Continue remainder of regimen      Relevant Medications   furosemide (LASIX) 40 MG tablet     Respiratory   COPD (chronic obstructive pulmonary disease) (HCC)    Will add spiriva to current regimen as trelegy was not covered. Continue dulera daily and albuterol  prn      Relevant Medications   tiotropium (SPIRIVA HANDIHALER) 18 MCG inhalation  capsule     Nervous and Auditory   Peripheral neuropathy    Encouraged her to start the new bottle right away as it's a much higher dose and should prove to be more beneficial. Patient agreeable and will make this change.         Other   Bilateral leg edema    No obvious benefit with low dose lasix, will increase as tolerated. Referral placed to Vascular specialist for further recommendations given severity       Relevant Orders   Ambulatory referral to Vascular Surgery    Other Visit Diagnoses    Colon cancer screening       Relevant Orders   Ambulatory referral to Gastroenterology   Breast cancer screening       Relevant Orders   MM DIGITAL SCREENING BILATERAL   Frequent falls       Referral placed for home health PT, OT and other safety needs. Working with CCM team closely to help with this   Relevant Orders   Ambulatory referral to Home Health   Weakness       Referral placed for home health services. Discussed need for conditioning and weight loss, hoping home PT will help with this   Relevant Orders   Ambulatory referral to [facility]       Follow up plan: Return in about 2 weeks (around 10/26/2018) for Edema, BP f/u.

## 2018-10-13 ENCOUNTER — Telehealth: Payer: Self-pay | Admitting: Family Medicine

## 2018-10-13 ENCOUNTER — Encounter: Payer: Self-pay | Admitting: Family Medicine

## 2018-10-13 NOTE — Telephone Encounter (Signed)
Surgical history updated.

## 2018-10-13 NOTE — Patient Instructions (Signed)
Thank you allowing the Chronic Care Management Team to be a part of your care! It was a pleasure speaking with you today!   CCM (Chronic Care Management) Team   Hardy Harcum RN, BSN Nurse Care Coordinator  [phone]  Cat[facility] PharmD  Clinical Pharmacist  [phone]  Eula Fried LCSW Clinical Social Worker [phone]  Goals Addressed            This Visit's Progress   . I need more help in the home (pt-stated)       Current Barriers:  [name] Knowledge Deficits related to managing multiple chronic diseases  . Lacks caregiver support.  . Film/video editor.  . Transportation barriers  Nurse Case Manager Clinical Goal(s):  [name] Over the next 90 days, patient will work with [facility]  to address needs related to Multiple chronic Disease Processes . Over the next 90 days, patient will work with [facility]  (community agency) to Increase Strength and regain some independence   Interventions:  . Provided education to patient re: Low Carb ADA Diet . Collaborated with PCP regarding PCS and [place] services in the home . Discussed plans with patient for ongoing care management follow up and provided patient with direct contact information for care management team . Placed a call to [place] at [facility], she was already aware of patient's referral. Made her aware patient was in need of a wider wheelchair and a shower seat.  . Empathetic listening utilized as patient talked about the loss of her Mother. Patient is working with LCSW to address grief.   Patient Self Care Activities:  . Currently UNABLE TO independently perform ADLs or IADLs   Initial goal documentation        The patient verbalized understanding of instructions provided today and declined a print copy of patient instruction materials.   The care management team will reach out to the patient again over the next 21 days.  The patient has been provided with contact information for the care management team  and has been advised to call with any health related questions or concerns.

## 2018-10-13 NOTE — Telephone Encounter (Signed)
Please call pt and go over surgical hx  Copied from [place] [id number]. Topic: General - Other >> Oct 13, 2018 11:45 AM Celene Kras A wrote: Reason for CRM: Pt called stating at her appt she was asked about the surgeries she had and states at the time was not able to understand the question. Pt is requesting a call back to discuss this further. Pt also would like to talk about the ramp, but she states she does not have insurance on her home. Please advise.

## 2018-10-13 NOTE — Chronic Care Management (AMB) (Signed)
  Chronic Care Management   Follow Up Note   10/13/2018 Name: Cheryl Chandler MRN: [id number] DOB: May 25, 1965  Referred by: [name], PA-C Reason for referral : Care Coordination [facility]) and Chronic Care Management (DM, )   Cheryl Chandler is a 53 y.o. year old female who is a primary care patient of [name], PA-C. The CCM team was consulted for assistance with chronic disease management and care coordination needs.    Review of patient status, including review of consultants reports, relevant laboratory and other test results, and collaboration with appropriate care team members and the patient's provider was performed as part of comprehensive patient evaluation and provision of chronic care management services.    Goals Addressed            This Visit's Progress   . I need more help in the home (pt-stated)       Current Barriers:  [name] Knowledge Deficits related to managing multiple chronic diseases  . Lacks caregiver support.  . Film/video editor.  . Transportation barriers  Nurse Case Manager Clinical Goal(s):  [name] Over the next 90 days, patient will work with [facility]  to address needs related to Multiple chronic Disease Processes . Over the next 90 days, patient will work with [facility]  (community agency) to Increase Strength and regain some independence   Interventions:  . Provided education to patient re: Low Carb ADA Diet . Collaborated with PCP regarding PCS and [place] services in the home . Discussed plans with patient for ongoing care management follow up and provided patient with direct contact information for care management team . Placed a call to [place] at [facility], she was already aware of patient's referral. Made her aware patient was in need of a wider wheelchair and a shower seat.  . Empathetic listening utilized as patient talked about the loss of her Mother. Patient is working with LCSW to address grief.   Patient  Self Care Activities:  . Currently UNABLE TO independently perform ADLs or IADLs   Initial goal documentation         The care management team will reach out to the patient again over the next 21 days.  The patient has been provided with contact information for the care management team and has been advised to call with any health related questions or concerns.    Merlene Morse Zerina Hallinan RN, BSN Nurse Case Editor, [facility]  [phone]) Business Mobile

## 2018-10-14 ENCOUNTER — Ambulatory Visit: Payer: Medicaid Other | Admitting: Family Medicine

## 2018-10-17 ENCOUNTER — Telehealth: Payer: Self-pay | Admitting: Family Medicine

## 2018-10-17 NOTE — Telephone Encounter (Signed)
Patient has chronic edema and SOB, this is not new and is being followed closely. Ok to give verbal but I'm not sure what that last sentence is supposed to say

## 2018-10-17 NOTE — Telephone Encounter (Signed)
Copied from [place] [id number]. Topic: Quick Communication - See Telephone Encounter >> Oct 17, 2018 10:33 AM Antonieta Iba C wrote: CRM for notification. See Telephone encounter for: 10/17/18.   [place] [facility] calling in to report that pt had a swelling at her lower extremity. Pt had a intermitted SOB- vitals are normal    VO frequency:  2 week 2 and 1 week 1   Would also like to Energy consolation while seeing pt.    [phone]

## 2018-10-17 NOTE — Telephone Encounter (Signed)
Ok to do that 

## 2018-10-17 NOTE — Telephone Encounter (Signed)
Energy Conservation, to teach the patient to conserve energy, so that she does cause fatigue in herself, is it ok if she does this with the patient.

## 2018-10-17 NOTE — Telephone Encounter (Signed)
Verbal orders given  

## 2018-10-18 ENCOUNTER — Telehealth: Payer: Self-pay | Admitting: Family Medicine

## 2018-10-18 ENCOUNTER — Ambulatory Visit: Payer: Self-pay | Admitting: Pharmacist

## 2018-10-18 ENCOUNTER — Telehealth: Payer: Self-pay

## 2018-10-18 DIAGNOSIS — R0609 Other forms of dyspnea: Secondary | ICD-10-CM

## 2018-10-18 NOTE — Telephone Encounter (Signed)
Pt has questions about what inhalers she should be using.

## 2018-10-18 NOTE — Patient Instructions (Signed)
Visit Information  Goals Addressed            This Visit's Progress     Patient Stated   . "I'm having a hard time breathing" (pt-stated)       Current Barriers:  . 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 Currently managed on Dulera 100/5 mcg 2 puffs BID, montelukast 10 mg QHS, and using albuterol nebulizer QID. Spiriva started at last office visit for triple LABA/LAMA/ICS therapy  o Patient called clinic today confused about whether she was supposed to take [place] or d/c Dulera and start Spiriva  Pharmacist Clinical Goal(s):  [name] Over the next 14 days, patient will work with PharmD and primary care provider to address needs related to optimized medication management  Interventions: . Contacted patient; explained to take Dulera BID and Spiriva daily. She verbalized understanding and expressed appreciation.  . Consider PFTs to most accurately characterize patient's lung disease  . In the future, will continue to discuss smoking cessation and offer support to patient  Patient Self Care Activities:  . Self administers medications as prescribed . Calls provider office for new concerns or questions  Please see past updates related to this goal by clicking on the "Past Updates" button in the selected goal         The patient verbalized understanding of instructions provided today and declined a print copy of patient instruction materials.   Plan:  - CCM team will continue to collaborate in the care of this complex patient.   [name], PharmD Clinical Pharmacist [place] [phone]

## 2018-10-18 NOTE — Chronic Care Management (AMB) (Signed)
  Chronic Care Management   Follow Up Note   10/18/2018 Name: Cheryl Chandler MRN: [id number] DOB: [birth year]  Referred by: [name], PA-C Reason for referral : Chronic Care Management (Medication Management)   Cheryl Chandler is a 53 y.o. year old female who is a primary care patient of [name], PA-C. The CCM team was consulted for assistance with chronic disease management and care coordination needs.    Patient contacted the office today with questions about her inhaler regimen.   Review of patient status, including review of consultants reports, relevant laboratory and other test results, and collaboration with appropriate care team members and the patient's provider was performed as part of comprehensive patient evaluation and provision of chronic care management services.    Goals Addressed            This Visit's Progress     Patient Stated   . "I'm having a hard time breathing" (pt-stated)       Current Barriers:  . 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 Currently managed on Dulera 100/5 mcg 2 puffs BID, montelukast 10 mg QHS, and using albuterol nebulizer QID. Spiriva started at last office visit for triple LABA/LAMA/ICS therapy  o Patient called clinic today confused about whether she was supposed to take [place] or d/c Dulera and start Spiriva  Pharmacist Clinical Goal(s):  [name] Over the next 14 days, patient will work with PharmD and primary care provider to address needs related to optimized medication management  Interventions: . Contacted patient; explained to take Dulera BID and Spiriva daily. She verbalized understanding and expressed appreciation.  . Consider PFTs to most accurately characterize patient's lung disease  . In the future, will continue to discuss smoking cessation and offer support to patient  Patient Self Care Activities:  . Self administers medications as  prescribed . Calls provider office for new concerns or questions  Please see past updates related to this goal by clicking on the "Past Updates" button in the selected goal          Plan:  - CCM team will continue to collaborate in the care of this complex patient.   [name], PharmD Clinical Pharmacist [place] [phone]

## 2018-10-18 NOTE — Telephone Encounter (Signed)
Contacted patient; explained to continue Dulera + Spiriva.   She also asked if someone could "resend the survey" that she thinks her husband accidentally threw away. She thinks it was a patient satisfaction survey, and she really would like to fill one out to express her appreciation for our team based care.

## 2018-10-19 NOTE — Assessment & Plan Note (Addendum)
Increase lasix to 40 mg and recheck in 2 weeks. Recently acquired home BP cuff, log readings and call with persistent abnormal readings. Continue remainder of regimen

## 2018-10-19 NOTE — Assessment & Plan Note (Signed)
Encouraged her to start the new bottle right away as it's a much higher dose and should prove to be more beneficial. Patient agreeable and will make this change.

## 2018-10-19 NOTE — Assessment & Plan Note (Signed)
Will add spiriva to current regimen as trelegy was not covered. Continue dulera daily and albuterol prn

## 2018-10-19 NOTE — Assessment & Plan Note (Signed)
No obvious benefit with low dose lasix, will increase as tolerated. Referral placed to Vascular specialist for further recommendations given severity

## 2018-10-20 NOTE — Telephone Encounter (Signed)
Does anyone know how to send out one of these surveys?

## 2018-11-01 ENCOUNTER — Other Ambulatory Visit: Payer: Self-pay | Admitting: Family Medicine

## 2018-11-01 ENCOUNTER — Encounter: Payer: Self-pay | Admitting: *Deleted

## 2018-11-01 DIAGNOSIS — E039 Hypothyroidism, unspecified: Secondary | ICD-10-CM

## 2018-11-01 DIAGNOSIS — J301 Allergic rhinitis due to pollen: Secondary | ICD-10-CM

## 2018-11-02 ENCOUNTER — Encounter: Payer: Self-pay | Admitting: Family Medicine

## 2018-11-02 ENCOUNTER — Other Ambulatory Visit: Payer: Self-pay

## 2018-11-02 ENCOUNTER — Ambulatory Visit (INDEPENDENT_AMBULATORY_CARE_PROVIDER_SITE_OTHER): Payer: Medicaid Other | Admitting: Family Medicine

## 2018-11-02 ENCOUNTER — Telehealth: Payer: Self-pay | Admitting: Family Medicine

## 2018-11-02 ENCOUNTER — Telehealth: Payer: Self-pay

## 2018-11-02 ENCOUNTER — Ambulatory Visit: Payer: Self-pay | Admitting: Licensed Clinical Social Worker

## 2018-11-02 VITALS — BP 168/73 | HR 72 | Ht 64.0 in

## 2018-11-02 DIAGNOSIS — F419 Anxiety disorder, unspecified: Secondary | ICD-10-CM

## 2018-11-02 DIAGNOSIS — M62838 Other muscle spasm: Secondary | ICD-10-CM

## 2018-11-02 DIAGNOSIS — E039 Hypothyroidism, unspecified: Secondary | ICD-10-CM

## 2018-11-02 DIAGNOSIS — R0981 Nasal congestion: Secondary | ICD-10-CM | POA: Diagnosis not present

## 2018-11-02 DIAGNOSIS — R600 Localized edema: Secondary | ICD-10-CM | POA: Diagnosis not present

## 2018-11-02 DIAGNOSIS — K0889 Other specified disorders of teeth and supporting structures: Secondary | ICD-10-CM

## 2018-11-02 DIAGNOSIS — J449 Chronic obstructive pulmonary disease, unspecified: Secondary | ICD-10-CM

## 2018-11-02 DIAGNOSIS — G629 Polyneuropathy, unspecified: Secondary | ICD-10-CM

## 2018-11-02 DIAGNOSIS — I10 Essential (primary) hypertension: Secondary | ICD-10-CM

## 2018-11-02 DIAGNOSIS — J301 Allergic rhinitis due to pollen: Secondary | ICD-10-CM

## 2018-11-02 MED ORDER — FUROSEMIDE 40 MG PO TABS: 40.0000 mg | ORAL_TABLET | Freq: Every day | ORAL | 0 refills | Status: DC

## 2018-11-02 MED ORDER — CLINDAMYCIN HCL 150 MG PO CAPS: 150.0000 mg | ORAL_CAPSULE | Freq: Two times a day (BID) | ORAL | 0 refills | Status: DC

## 2018-11-02 MED ORDER — LISINOPRIL 30 MG PO TABS: 30.0000 mg | ORAL_TABLET | Freq: Every day | ORAL | 0 refills | Status: DC

## 2018-11-02 MED ORDER — OMEPRAZOLE 20 MG PO CPDR: 20.0000 mg | DELAYED_RELEASE_CAPSULE | Freq: Every day | ORAL | 12 refills | Status: DC

## 2018-11-02 NOTE — Assessment & Plan Note (Addendum)
Unable to examine or check labs today given telephonic visit, so will hold lasix dose at 40 mg rather than increase at this time. Recheck things in 2 weeks unless significantly worsening in meantime. Awaiting establish care visits with HF clinic and vascular surgery in the near future

## 2018-11-02 NOTE — Chronic Care Management (AMB) (Signed)
  Care Management   Follow Up Note   11/02/2018 Name: Cheryl Chandler MRN: [id number] DOB: [birth year]  Referred by: [name], PA-C Reason for referral : Care Coordination   Cheryl Chandler is a 53 y.o. year old female who is a primary care patient of [name], [place]. The care management team was consulted for assistance with care management and care coordination needs.    Review of patient status, including review of consultants reports, relevant laboratory and other test results, and collaboration with appropriate care team members and the patient's provider was performed as part of comprehensive patient evaluation and provision of chronic care management services.    Goals Addressed    . "I'm having a hard time processing the recent loss of my mother" (pt-stated)       Current Barriers:  . Financial constraints . Mental Health Concerns  . Social Isolation . Limited education about mental health support resources that are available to her within the local area* . Cognitive Deficits . Lacks knowledge of community resource: Grief support resources/programs that are offered free of charge  within the area  Clinical Social Work Clinical Goal(s):  [name] Over the next 90 days, client will work with SW to address concerns related to experiencing ongoing symptoms of depression, sadness and grief since the passing of her mother . Over the next 90 days, patient will work with LCSW to address needs related to implementing appropriate self-care and depression management.   Interventions: . Patient interviewed and appropriate assessments performed . Provided mental health counseling with regards to patient's recent passing of her mother. Patient reports being active with [facility]. She shares that she has been in contact with a caseworker there that has spent time with her on the phone and has sent her resource information on available mental health  support resources within the area. Patient denies wanting LCSW to make a referral for grief counseling through [place] again on 11/02/2018 but was receptive to coping skill and resource education/information that was provided during session today. [name] LCSW assisted patient with completing application for [facility]. Patient agreeable to mail back application to [employer] once completed. . Provided patient with information about managing grief and depression within her daily life. Patient reports that she talks out loud to her mother's picture and this is very helpful to her. . Provided reflective listening and implemented appropriate interventions to help suppport patient and her emotional needs  . Discussed plans with patient for ongoing care management follow up and provided patient with direct contact information for care management team . Advised patient to contact CCM team with any urgent case management needs. . Assisted patient/caregiver with obtaining information about health plan benefits  Patient Self Care Activities:  . Attends all scheduled provider appointments . Calls provider office for new concerns or questions  Please see past updates related to this goal by clicking on the "Past Updates" button in the selected goal      The care management team will reach out to the patient again next month and will follow up on needs.  Eula Fried, BSW, MSW, [facility] Care Management [email or web address] Phone: [phone]

## 2018-11-02 NOTE — Progress Notes (Signed)
BP (!) 168/73   Pulse 72   Ht 5\' 4"  (1.626 m)   BMI 51.49 kg/m    Subjective:    Patient ID: Cheryl Chandler, female    DOB: 02/14/1966, 53 y.o.   MRN: [id number]  HPI: Cheryl Chandler is a 53 y.o. female  Chief Complaint  Patient presents with  . Hypertension    f/u  . Edema    . This visit was completed via telephone due to the restrictions of the COVID-19 pandemic. All issues as above were discussed and addressed but no physical exam was performed. If it was felt that the patient should be evaluated in the office, they were directed there. The patient verbally consented to this visit. Patient was unable to complete an audio/visual visit due to patient's lack of access to video technology. Due to the catastrophic nature of the COVID-19 pandemic, this visit was done through audio contact only. . Location of the patient: home . Location of the provider: work . Those involved with this call:  . Provider: Roosvelt Maserachel Jaksen Fiorella, PA-C . CMA: Elton SinAnita Quito, CMA . Front Desk/Registration: Harriet PhoJoliza Johnson  . Time spent on call: 25 minutes on the phone discussing health concerns. 10 minutes total spent in review of patient's record and preparation of their chart. I verified patient identity using two factors (patient name and date of birth). Patient consents verbally to being seen via telemedicine visit today.   Here today for BP and edema f/u. Lasix increased to 40 mg at last visit and also taking 20 mg lisinopril. Home BPs have been 130s-170s/70s. Tolerating the medications well. Still having significant edema but notes some mild improvement. Urinating more frequently but otherwise no side effects, dizziness, HAs, CP. Has upcoming appts with Cardiology and Vascular specialists.   Thinks she's fighting a cold, stuffy nose, sore throat, diarrhea for a few days. Resolving spontaneously per patient. Has been staying home for the most part, no sick contacts or recent travel. Denies fevers,  chills, body aches, SOB.   Dealing with a tooth abscess that's coming back. Was treated previously with antibiotics which helped. Does not have a dentist currently. Using oragel and warm salt water.   Spiriva helping significantly with her SOB. Not having any side effects and feeling great improvement in addition to her other inhalers.   Started the increased gabapentin, notes significant benefit with her leg pains. Has had to postpone her pain management establish care visit due to finances but feels this dose is helping her get by.   Relevant past medical, surgical, family and social history reviewed and updated as indicated. Interim medical history since our last visit reviewed. Allergies and medications reviewed and updated.  Review of Systems  Per HPI unless specifically indicated above     Objective:    BP (!) 168/73   Pulse 72   Ht 5\' 4"  (1.626 m)   BMI 51.49 kg/m   Wt Readings from Last 3 Encounters:  09/15/18 300 lb (136.1 kg)  08/16/18 300 lb (136.1 kg)  08/07/18 300 lb (136.1 kg)    Physical Exam  Unable to complete PE for today's visit given patient's lack of access to video technology for today's visit.   Results for orders placed or performed in visit on 09/30/18  CBC with Differential/Platelet  Result Value Ref Range   WBC 9.1 3.4 - 10.8 x10E3/uL   RBC 4.31 3.77 - 5.28 x10E6/uL   Hemoglobin 12.8 11.1 - 15.9 g/dL   Hematocrit 38.8  34.0 - 46.6 %   MCV 90 79 - 97 fL   MCH 29.7 26.6 - 33.0 pg   MCHC 33.0 31.5 - 35.7 g/dL   RDW 16.113.1 09.611.7 - 04.515.4 %   Platelets 365 150 - 450 x10E3/uL   Neutrophils 67 Not Estab. %   Lymphs 23 Not Estab. %   Monocytes 7 Not Estab. %   Eos 2 Not Estab. %   Basos 1 Not Estab. %   Neutrophils Absolute 6.1 1.4 - 7.0 x10E3/uL   Lymphocytes Absolute 2.0 0.7 - 3.1 x10E3/uL   Monocytes Absolute 0.6 0.1 - 0.9 x10E3/uL   EOS (ABSOLUTE) 0.2 0.0 - 0.4 x10E3/uL   Basophils Absolute 0.1 0.0 - 0.2 x10E3/uL   Immature Granulocytes 0 Not  Estab. %   Immature Grans (Abs) 0.0 0.0 - 0.1 x10E3/uL  Comprehensive metabolic panel  Result Value Ref Range   Glucose 97 65 - 99 mg/dL   BUN 16 6 - 24 mg/dL   Creatinine, Ser 4.090.81 0.57 - 1.00 mg/dL   GFR calc non Af Amer 84 >59 mL/min/1.73   GFR calc Af Amer 97 >59 mL/min/1.73   BUN/Creatinine Ratio 20 9 - 23   Sodium 142 134 - 144 mmol/L   Potassium 4.4 3.5 - 5.2 mmol/L   Chloride 103 96 - 106 mmol/L   CO2 22 20 - 29 mmol/L   Calcium 8.5 (L) 8.7 - 10.2 mg/dL   Total Protein 6.4 6.0 - 8.5 g/dL   Albumin 3.8 3.8 - 4.9 g/dL   Globulin, Total 2.6 1.5 - 4.5 g/dL   Albumin/Globulin Ratio 1.5 1.2 - 2.2   Bilirubin Total 0.4 0.0 - 1.2 mg/dL   Alkaline Phosphatase 76 39 - 117 IU/L   AST 15 0 - 40 IU/L   ALT 23 0 - 32 IU/L  Lipid Panel w/o Chol/HDL Ratio  Result Value Ref Range   Cholesterol, Total 156 100 - 199 mg/dL   Triglycerides 811148 0 - 149 mg/dL   HDL 40 >91>39 mg/dL   VLDL Cholesterol Cal 30 5 - 40 mg/dL   LDL Calculated 86 0 - 99 mg/dL  TSH  Result Value Ref Range   TSH 2.190 0.450 - 4.500 uIU/mL  HgB A1c  Result Value Ref Range   Hgb A1c MFr Bld 5.8 (H) 4.8 - 5.6 %   Est. average glucose Bld gHb Est-mCnc 120 mg/dL  Vitamin D (25 hydroxy)  Result Value Ref Range   Vit D, 25-Hydroxy 6.2 (L) 30.0 - 100.0 ng/mL      Assessment & Plan:   Problem List Items Addressed This Visit      Cardiovascular and Mediastinum   Essential hypertension - Primary    Increase lisinopril to 30 mg daily, continue lasix at 40 mg dose. Monitor closely at home and call with persistent abnormal readings      Relevant Medications   lisinopril (ZESTRIL) 30 MG tablet   furosemide (LASIX) 40 MG tablet     Respiratory   COPD (chronic obstructive pulmonary disease) (HCC)    Significant improvement in SOB sxs with addition of spiriva to current regimen. Chronic cough has dissipated almost completely. Continue current regimen        Nervous and Auditory   Peripheral neuropathy    Significant  improvement in pain sxs with increased gabapentin dose, continue current regimen        Other   Bilateral leg edema    Unable to examine or check labs today given telephonic  visit, so will hold lasix dose at 40 mg rather than increase at this time. Recheck things in 2 weeks unless significantly worsening in meantime. Awaiting establish care visits with HF clinic and vascular surgery in the near future       Other Visit Diagnoses    Pain, dental       Hx of abscess, pt states consistent with those sxs. Will send abx to treat this, salt water gargles in meantime. Est with dentist ASAP.    Nasal congestion       Sxs resolving per patient, but given COVID 19 pandemic recommended at least 1 wk quarantine as she declines testing. F/u if worsening       Follow up plan: Return in about 4 weeks (around 11/30/2018) for BP f/u.

## 2018-11-02 NOTE — Telephone Encounter (Signed)
Medication Refill - Medication: levothyroxine (SYNTHROID) 50 MCG tablet methocarbamol (ROBAXIN) 750 MG tablet montelukast (SINGULAIR) 10 MG tablet    Has the patient contacted their pharmacy? Yes.   (Agent: If no, request that the patient contact the pharmacy for the refill.) (Agent: If yes, when and what did the pharmacy advise?)  Preferred Pharmacy (with phone number or street name): [facility] [place], [place].  Agent: Please be advised that RX refills may take up to 3 business days. We ask that you follow-up with your pharmacy.

## 2018-11-03 MED ORDER — METHOCARBAMOL 750 MG PO TABS: ORAL_TABLET | ORAL | 0 refills | Status: DC

## 2018-11-03 MED ORDER — MONTELUKAST SODIUM 10 MG PO TABS: 10.0000 mg | ORAL_TABLET | Freq: Every day | ORAL | 2 refills | Status: DC

## 2018-11-03 MED ORDER — LEVOTHYROXINE SODIUM 50 MCG PO TABS: ORAL_TABLET | ORAL | 2 refills | Status: DC

## 2018-11-03 NOTE — Telephone Encounter (Signed)
Rxs sent

## 2018-11-04 ENCOUNTER — Ambulatory Visit: Payer: Self-pay | Admitting: *Deleted

## 2018-11-04 ENCOUNTER — Telehealth: Payer: Self-pay

## 2018-11-04 DIAGNOSIS — R600 Localized edema: Secondary | ICD-10-CM

## 2018-11-04 DIAGNOSIS — J449 Chronic obstructive pulmonary disease, unspecified: Secondary | ICD-10-CM

## 2018-11-04 NOTE — Chronic Care Management (AMB) (Signed)
  Chronic Care Management   Follow Up Note   11/04/2018 Name: Cheryl Chandler MRN: [id number] DOB: 26-Mar-1966  Referred by: [name], PA-C Reason for referral : Chronic Care Management (Unsuccessful attempt )   Cheryl Chandler is a 52 y.o. year old female who is a primary care patient of [name], PA-C. The CCM team was consulted for assistance with chronic disease management and care coordination needs.    Review of patient status, including review of consultants reports, relevant laboratory and other test results, and collaboration with appropriate care team members and the patient's provider was performed as part of comprehensive patient evaluation and provision of chronic care management services.    Goals Addressed            This Visit's Progress   . I need more help in the home (pt-stated)       Current Barriers:  [name] Knowledge Deficits related to managing multiple chronic diseases  . Lacks caregiver support.  . Film/video editor.  . Transportation barriers  Nurse Case Manager Clinical Goal(s):  [name] Over the next 90 days, patient will work with [facility]  to address needs related to Multiple chronic Disease Processes . Over the next 90 days, patient will work with [facility]  (community agency) to Increase Strength and regain some independence   Interventions:  . Provided education to patient re: Low Carb ADA Diet . Collaborated with PCP regarding PCS and [place] services in the home . Discussed plans with patient for ongoing care management follow up and provided patient with direct contact information for care management team . Patient reports [facility] home health has started and she has been very motivated to work with them.  . Placed a call to [facility] [phone] to discuss the process to obtain a new wheelchair Voice Mail left - she states they have not assisted with new wheel chair and she is in need of this.  Hanley Seamen patient  number for ramp referral-[employer] at [phone]- states she will call . She states a slight improvement in her swelling but not much: we discussed up coming appts with Cardiology 7/28 and Vascular 7/21 . We discussed the need for a sleep study- Plan to reach out to cardiology and talk with them about the possible need for this referral.   Patient Self Care Activities:  . Currently UNABLE TO independently perform ADLs or IADLs   Please see past updates related to this goal by clicking on the "Past Updates" button in the selected goal          The care management team will reach out to the patient again over the next 30 days.  The patient has been provided with contact information for the care management team and has been advised to call with any health related questions or concerns.    Merlene Morse Earsie Humm RN, BSN Nurse Case Editor, commissioning [facility] Care [facility]  [phone]) Business Mobile

## 2018-11-04 NOTE — Patient Instructions (Signed)
Thank you allowing the Chronic Care Management Team to be a part of your care! It was a pleasure speaking with you today!  CCM (Chronic Care Management) Team   Jalena Vanderlinden RN, BSN Nurse Care Coordinator  [phone]  Catie [facility] PharmD  Clinical Pharmacist  [phone]  Eula Fried LCSW Clinical Social Worker [phone]  Goals Addressed            This Visit's Progress   . I need more help in the home (pt-stated)       Current Barriers:  [name] Knowledge Deficits related to managing multiple chronic diseases  . Lacks caregiver support.  . Film/video editor.  . Transportation barriers  Nurse Case Manager Clinical Goal(s):  [name] Over the next 90 days, patient will work with [facility]  to address needs related to Multiple chronic Disease Processes . Over the next 90 days, patient will work with [facility]  (community agency) to Increase Strength and regain some independence   Interventions:  . Provided education to patient re: Low Carb ADA Diet . Collaborated with PCP regarding PCS and [place] services in the home . Discussed plans with patient for ongoing care management follow up and provided patient with direct contact information for care management team . Patient reports [facility] home health has started and she has been very motivated to work with them.  . Placed a call to [facility] [phone] to discuss the process to obtain a new wheelchair Voice Mail left - she states they have not assisted with new wheel chair and she is in need of this.  Hanley Seamen patient number for ramp referral-[employer] at [phone]- states she will call . She states a slight improvement in her swelling but not much: we discussed up coming appts with Cardiology 7/28 and Vascular 7/21 . We discussed the need for a sleep study- Plan to reach out to cardiology and talk with them about the possible need for this referral.   Patient Self Care Activities:  . Currently UNABLE  TO independently perform ADLs or IADLs   Please see past updates related to this goal by clicking on the "Past Updates" button in the selected goal         The patient verbalized understanding of instructions provided today and declined a print copy of patient instruction materials.   The patient has been provided with contact information for the care management team and has been advised to call with any health related questions or concerns.

## 2018-11-06 NOTE — Assessment & Plan Note (Signed)
Significant improvement in pain sxs with increased gabapentin dose, continue current regimen

## 2018-11-06 NOTE — Assessment & Plan Note (Signed)
Significant improvement in SOB sxs with addition of spiriva to current regimen. Chronic cough has dissipated almost completely. Continue current regimen

## 2018-11-06 NOTE — Assessment & Plan Note (Addendum)
Increase lisinopril to 30 mg daily, continue lasix at 40 mg dose. Monitor closely at home and call with persistent abnormal readings

## 2018-11-07 ENCOUNTER — Encounter (INDEPENDENT_AMBULATORY_CARE_PROVIDER_SITE_OTHER): Payer: Self-pay

## 2018-11-07 ENCOUNTER — Ambulatory Visit (INDEPENDENT_AMBULATORY_CARE_PROVIDER_SITE_OTHER): Payer: Medicaid Other | Admitting: Vascular Surgery

## 2018-11-07 ENCOUNTER — Encounter (INDEPENDENT_AMBULATORY_CARE_PROVIDER_SITE_OTHER): Payer: Self-pay | Admitting: Vascular Surgery

## 2018-11-07 ENCOUNTER — Other Ambulatory Visit: Payer: Self-pay

## 2018-11-07 VITALS — BP 181/77 | HR 76 | Resp 17 | Ht 63.0 in | Wt 290.0 lb

## 2018-11-07 DIAGNOSIS — I890 Lymphedema, not elsewhere classified: Secondary | ICD-10-CM | POA: Diagnosis not present

## 2018-11-07 DIAGNOSIS — J449 Chronic obstructive pulmonary disease, unspecified: Secondary | ICD-10-CM | POA: Diagnosis not present

## 2018-11-07 DIAGNOSIS — E785 Hyperlipidemia, unspecified: Secondary | ICD-10-CM

## 2018-11-07 DIAGNOSIS — F17210 Nicotine dependence, cigarettes, uncomplicated: Secondary | ICD-10-CM

## 2018-11-07 DIAGNOSIS — Z79899 Other long term (current) drug therapy: Secondary | ICD-10-CM

## 2018-11-07 DIAGNOSIS — I872 Venous insufficiency (chronic) (peripheral): Secondary | ICD-10-CM

## 2018-11-07 DIAGNOSIS — I10 Essential (primary) hypertension: Secondary | ICD-10-CM | POA: Diagnosis not present

## 2018-11-07 NOTE — Progress Notes (Signed)
MRN : [id number]  Cheryl Chandler is a 53 y.o. (02/21/1966) female who presents with chief complaint of  Chief Complaint  Patient presents with  . New Patient (Initial Visit)    bilateral edema  .  History of Present Illness:  Patient is seen for evaluation of leg pain and leg swelling. The patient first noticed the swelling remotely. The swelling is associated with pain and discoloration. The pain and swelling worsens with prolonged dependency and improves with elevation. The pain is unrelated to activity.  The patient notes that in the morning the legs are significantly improved but they steadily worsened throughout the course of the day. The patient also notes a steady worsening of the discoloration in the ankle and shin area.   The patient denies claudication symptoms.  The patient denies symptoms consistent with rest pain.  The patient denies and extensive history of DJD and LS spine disease.  The patient has no had any past angiography, interventions or vascular surgery.  Elevation makes the leg symptoms better, dependency makes them much worse. There is no history of ulcerations. The patient denies any recent changes in medications.  The patient has not been wearing graduated compression.  The patient denies a history of DVT or PE. There is no prior history of phlebitis. There is no history of primary lymphedema.  No history of malignancies. No history of trauma or groin or pelvic surgery. There is no history of radiation treatment to the groin or pelvis  The patient denies amaurosis fugax or recent TIA symptoms. There are no recent neurological changes noted. The patient denies recent episodes of angina or shortness of breath   Current Meds  Medication Sig  . albuterol (PROVENTIL) (2.5 MG/3ML) 0.083% nebulizer solution INHALE CONTENTS OF 1 VIAL IN NEBULIZER EVERY 4 HOURS IF NEEDED FOR WHEEZING OR SHORTNESS OF BREATH.  . citalopram (CELEXA) 20 MG tablet TAKE 1 TABLET  BY MOUTH ONCE DAILY.  . clindamycin (CLEOCIN) 150 MG capsule Take 1 capsule (150 mg total) by mouth 2 (two) times a day.  . DULERA 100-5 MCG/ACT AERO INHALE 2 PUFFS BY MOUTH TWICE A DAY. RINSE MOUTH AFTER USE.  [name]. EPINEPHrine 0.3 mg/0.3 mL IJ SOAJ injection USE AS DIRECTED.  . furosemide (LASIX) 40 MG tablet Take 1 tablet (40 mg total) by mouth daily.  [name]. gabapentin (NEURONTIN) 300 MG capsule TAKE 1 CAPSULE BY MOUTH 3 TIMES DAILY ASNEEDED  . levothyroxine (SYNTHROID) 50 MCG tablet TAKE 1 TABLET BY MOUTH ONCE DAILY ON AN EMPTY STOMACH. WAIT 30 MINUTES BEFORE TAKING OTHER MEDS.  [name]. lisinopril (ZESTRIL) 30 MG tablet Take 1 tablet (30 mg total) by mouth daily.  . meloxicam (MOBIC) 15 MG tablet TAKE 1 TABLET BY MOUTH ONCE A DAY WITH FOOD OR MILK IF NEEDE  . methocarbamol (ROBAXIN) 750 MG tablet TAKE 1-2 TABLETS BY MOUTH 3 TIMES DAILY IF NEEDED FOR BACK PAIN.  . montelukast (SINGULAIR) 10 MG tablet Take 1 tablet (10 mg total) by mouth daily.  . nitroGLYCERIN (NITROSTAT) 0.4 MG SL tablet DISSOLVE 1 TABLET UNDER TONGUE AT ONSET OF CHEST PAIN. REPEAT IN 5 MIN IF NOT RESOLVED, MAX 3 DOSES. 911 IF NEEDED.  [name]. omeprazole (PRILOSEC) 20 MG capsule Take 1 capsule (20 mg total) by mouth daily.  [name]. PROAIR HFA 108 (90 Base) MCG/ACT inhaler inhale 2 puffs by mouth every 4 to 6 hours if needed for wheezing  . simvastatin (ZOCOR) 40 MG tablet TAKE 1 TABLET BY MOUTH ONCE DAILY  .  tiotropium (SPIRIVA HANDIHALER) 18 MCG inhalation capsule Place 1 capsule (18 mcg total) into inhaler and inhale daily.  . traMADol (ULTRAM) 50 MG tablet TAKE 1 TABLET BY MOUTH EVERY 8 HOURS AS NEEDED PAIN  . Vitamin D, Ergocalciferol, (DRISDOL) 1.25 MG (50000 UT) CAPS capsule Take 1 capsule (50,000 Units total) by mouth every 7 (seven) days.    Past Medical History:  Diagnosis Date  . Arthritis   . Asthma   . COPD (chronic obstructive pulmonary disease) ([place])   . Hypertension   . Pericarditis   . Personal history of urinary calculi  11/29/2014  . Thyroid disease     Past Surgical History:  Procedure Laterality Date  . APPENDECTOMY    . arm surgery Left    pins  . fatty tumor removed from back    . JOINT REPLACEMENT Right   . LEG SURGERY Bilateral   . OVARIAN CYST SURGERY Right    took piece of right ovary due to cyst  . TOTAL HIP ARTHROPLASTY Right   . wisdom teeth removed      Social History Social History   Tobacco Use  . Smoking status: Current Every Day Smoker    Packs/day: 0.25    Years: 30.00    Pack years: 7.50    Types: Cigarettes  . Smokeless tobacco: Never Used  Substance Use Topics  . Alcohol use: No    Alcohol/week: 0.0 standard drinks  . Drug use: No    Family History Family History  Problem Relation Age of Onset  . Seizures Sister        disorders  No family history of bleeding/clotting disorders, porphyria or autoimmune disease   Allergies  Allergen Reactions  . Codeine Shortness Of Breath and Rash  . Percocet [Oxycodone-Acetaminophen] Shortness Of Breath  . Sulfa Antibiotics Shortness Of Breath and Rash  . Aspirin Hives  . Bee Venom     Bee stings  . Darvon [Propoxyphene]     Darvocet-N 100  . Latex   . Oxycodone Nausea And Vomiting  . Penicillins     Has patient had a PCN reaction causing immediate rash, facial/tongue/throat swelling, SOB or lightheadedness with hypotension: Unknown Has patient had a PCN reaction causing severe rash involving mucus membranes or skin necrosis: Unknown Has patient had a PCN reaction that required hospitalization: Unknown Has patient had a PCN reaction occurring within the last 10 years: Unknown If all of the above answers are "NO", then may proceed with Cephalosporin use.     REVIEW OF SYSTEMS (Negative unless checked)  Constitutional: [] Weight loss  [] Fever  [] Chills Cardiac: [] Chest pain   [] Chest pressure   [] Palpitations   [] Shortness of breath when laying flat   [] Shortness of breath with exertion. Vascular:  [] Pain in legs  with walking   [x] Pain in legs at rest  [] History of DVT   [] Phlebitis   [x] Swelling in legs   [] Varicose veins   [] Non-healing ulcers Pulmonary:   [] Uses home oxygen   [] Productive cough   [] Hemoptysis   [] Wheeze  [] COPD   [] Asthma Neurologic:  [] Dizziness   [] Seizures   [] History of stroke   [] History of TIA  [] Aphasia   [] Vissual changes   [] Weakness or numbness in arm   [] Weakness or numbness in leg Musculoskeletal:   [] Joint swelling   [x] Joint pain   [] Low back pain Hematologic:  [] Easy bruising  [] Easy bleeding   [] Hypercoagulable state   [] Anemic Gastrointestinal:  [] Diarrhea   [] Vomiting  [] Gastroesophageal reflux/heartburn   []   Difficulty swallowing. Genitourinary:  [] Chronic kidney disease   [] Difficult urination  [] Frequent urination   [] Blood in urine Skin:  [x] Rashes   [] Ulcers  Psychological:  [] History of anxiety   []  History of major depression.  Physical Examination  Vitals:   11/07/18 1323  BP: (!) 181/77  Pulse: 76  Resp: 17  Weight: 290 lb (131.5 kg)  Height: 5\' 3"  (1.6 m)   Body mass index is 51.37 kg/m. Gen: WD/WN, NAD Head: [place]/AT, No temporalis wasting.  Ear/Nose/Throat: Hearing grossly intact, nares w/o erythema or drainage, poor dentition Eyes: PER, EOMI, sclera nonicteric.  Neck: Supple, no masses.  No bruit or JVD.  Pulmonary:  Good air movement, clear to auscultation bilaterally, no use of accessory muscles.  Cardiac: RRR, normal S1, S2, no Murmurs. Vascular: scattered varicosities present bilaterally.  Mild venous stasis changes to the legs bilaterally.  4+ soft pitting edema Vessel Right Left  Radial Palpable Palpable  PT Palpable Palpable  DP Palpable Palpable  Gastrointestinal: soft, non-distended. No guarding/no peritoneal signs.  Musculoskeletal: M/S 5/5 throughout.  No deformity or atrophy.  Neurologic: CN 2-12 intact. Pain and light touch intact in extremities.  Symmetrical.  Speech is fluent. Motor exam as listed above. Psychiatric: Judgment  intact, Mood & affect appropriate for pt's clinical situation. Dermatologic: venous rashes no ulcers noted.  No changes consistent with cellulitis. Lymph : No Cervical lymphadenopathy, no lichenification or skin changes of chronic lymphedema.  CBC Lab Results  Component Value Date   WBC 9.1 10/03/2018   HGB 12.8 10/03/2018   HCT 38.8 10/03/2018   MCV 90 10/03/2018   PLT 365 10/03/2018    BMET    Component Value Date/Time   NA 142 10/03/2018 1400   NA 135 08/05/2014 1306   K 4.4 10/03/2018 1400   K 4.5 08/05/2014 1306   CL 103 10/03/2018 1400   CL 102 08/05/2014 1306   CO2 22 10/03/2018 1400   CO2 25 08/05/2014 1306   GLUCOSE 97 10/03/2018 1400   GLUCOSE 110 (H) 09/15/2018 1215   GLUCOSE 136 (H) 08/05/2014 1306   BUN 16 10/03/2018 1400   BUN 23 (H) 08/05/2014 1306   CREATININE 0.81 10/03/2018 1400   CREATININE 0.86 08/05/2014 1306   CALCIUM 8.5 (L) 10/03/2018 1400   CALCIUM 8.2 (L) 08/05/2014 1306   GFRNONAA 84 10/03/2018 1400   GFRNONAA >60 08/05/2014 1306   GFRAA 97 10/03/2018 1400   GFRAA >60 08/05/2014 1306   CrCl cannot be calculated (Patient's most recent lab result is older than the maximum 21 days allowed.).  COAG No results found for: INR, PROTIME  Radiology No results found.   Assessment/Plan 1. Lymphedema I have had a long discussion with the patient regarding swelling and why it  causes symptoms.  Patient will begin wearing graduated compression wraps class 1 (20-30 mmHg) on a daily basis a prescription was given. The patient will  beginning wearing the stockings first thing in the morning and removing them in the evening. The patient is instructed specifically not to sleep in the stockings.   In addition, behavioral modification will be initiated.  This will include frequent elevation, use of over the counter pain medications and exercise such as walking.  I have reviewed systemic causes for chronic edema such as liver, kidney and cardiac etiologies.   The patient denies problems with these organ systems.    Consideration for a lymph pump will also be made based upon the effectiveness of conservative therapy.  This would  help to improve the edema control and prevent sequela such as ulcers and infections   Patient should undergo duplex ultrasound of the venous system to ensure that DVT or reflux is not present.  The patient will follow-up with me after the ultrasound.   - VAS US LOWER EXTREMITY VENOUS REFLUX; Future  2. Chronic venous insufficiency I have had a long discussion with the patient regarding swelling and why it  causes symptoms.  Patient will begin wearing graduated compression wraps class 1 (20-30 mmHg) on a daily basis a prescription was given. The patient will  beginning wearing the stockings first thing in the morning and removing them in the evening. The patient is instructed specifically not to sleep in the stockings.   In addition, behavioral modification will be initiated.  This will include frequent elevation, use of over the counter pain medications and exercise such as walking.  I have reviewed systemic causes for chronic edema such as liver, kidney and cardiac etiologies.  The patient denies problems with these organ systems.    Consideration for a lymph pump will also be made based upon the effectiveness of conservative therapy.  This would help to improve the edema control and prevent sequela such as ulcers and infections   Patient should undergo duplex ultrasound of the venous system to ensure that DVT or reflux is not present.  The patient will follow-up with me after the ultrasound.   - VAS US LOWER EXTREMITY VENOUS REFLUX; Future  3. Essential hypertension Continue antihypertensive medications as already ordered, these medications have been reviewed and there are no changes at this time.   4. Chronic obstructive pulmonary disease, unspecified COPD type (HCC) Continue pulmonary medications and aerosols as  already ordered, these medications have been reviewed and there are no changes at this time.    5. Hyperlipidemia, unspecified hyperlipidemia type Continue statin as ordered and reviewed, no changes at this time    Levora DredgeGregory Schnier, MD  11/07/2018 2:27 PM

## 2018-11-08 ENCOUNTER — Telehealth: Payer: Self-pay | Admitting: Family Medicine

## 2018-11-08 NOTE — Telephone Encounter (Signed)
I think you've been working with her on this - thanks so much!  Copied from [place] [id number]. Topic: General - Other >> Nov 07, 2018  4:53 PM Yvette Rack wrote: Reason for CRM: Pt stated that [place] told her that they only install ramps at homes of patients that  are 48 and older. Pt requests a call back with names of other locations that build ramps on homes. >> Nov 08, 2018  7:54 AM Jill Side wrote: Unable to route to [facility], so I sent her an in basket as well.

## 2018-11-09 DIAGNOSIS — I872 Venous insufficiency (chronic) (peripheral): Secondary | ICD-10-CM | POA: Insufficient documentation

## 2018-11-10 ENCOUNTER — Ambulatory Visit: Payer: Self-pay | Admitting: *Deleted

## 2018-11-10 ENCOUNTER — Telehealth: Payer: Self-pay

## 2018-11-10 ENCOUNTER — Ambulatory Visit: Payer: Medicaid Other | Admitting: Student in an Organized Health Care Education/Training Program

## 2018-11-10 DIAGNOSIS — R600 Localized edema: Secondary | ICD-10-CM

## 2018-11-10 DIAGNOSIS — J449 Chronic obstructive pulmonary disease, unspecified: Secondary | ICD-10-CM

## 2018-11-10 NOTE — Patient Instructions (Signed)
Thank you allowing the Chronic Care Management Team to be a part of your care! It was a pleasure speaking with you today!  CCM (Chronic Care Management) Team   Lavera Vandermeer RN, BSN Nurse Care Coordinator  [phone]  Catie [facility] PharmD  Clinical Pharmacist  [phone]  Eula Fried LCSW Clinical Social Worker [phone]  Goals Addressed            This Visit's Progress   . I need more help in the home (pt-stated)       Current Barriers:  [name] Knowledge Deficits related to managing multiple chronic diseases  . Lacks caregiver support.  . Film/video editor.  . Transportation barriers  Nurse Case Manager Clinical Goal(s):  [name] Over the next 90 days, patient will work with [facility]  to address needs related to Multiple chronic Disease Processes . Over the next 90 days, patient will work with [facility]  (community agency) to Increase Strength and regain some independence   Interventions:  . Provided education to patient re: Low Carb ADA Diet . Collaborated with PCP regarding PCS and [place] services in the home . Discussed plans with patient for ongoing care management follow up and provided patient with direct contact information for care management team . Patient reports [facility] home health has started and she has been very motivated to work with them.  . Encouraged patient to call Aldona Bar @ [facility] [phone] to discuss the process to obtain a new wheelchair Voice Mail left - she states they have not assisted with new wheel chair and she is in need of this.  Hanley Seamen patient number for ramp referral-[facility] [facility] [employer] [phone] [employer] . She states a slight improvement in her swelling but not much: She stated she attended her Vascular MD appt and her need for leg compression pump for the presence of Lymphedema. Requested she speak with [facility] about this when she talked to them about the wheelchair.  . We discussed up coming appts with Cardiology 7/28   . Collaboration call with LCSW to obtain new ramp resource Jenny Reichmann and [employer] [phone] prior to calling patient . Patient concerned about finances and the process of obtaining disability, will reach out to CCM LCSW to ask about this.   Patient Self Care Activities:  . Currently UNABLE TO independently perform ADLs or IADLs   Please see past updates related to this goal by clicking on the "Past Updates" button in the selected goal         The patient verbalized understanding of instructions provided today and declined a print copy of patient instruction materials.   The patient has been provided with contact information for the care management team and has been advised to call with any health related questions or concerns.

## 2018-11-10 NOTE — Chronic Care Management (AMB) (Signed)
  Chronic Care Management   Follow Up Note   11/10/2018 Name: Cheryl Chandler MRN: [id number] DOB: 08/12/1965  Referred by: [name], PA-C Reason for referral : Chronic Care Management (Chronic disease managment ) and Care Coordination ([name])   Cheryl Chandler is a 53 y.o. year old female who is a primary care patient of [name], PA-C. The CCM team was consulted for assistance with chronic disease management and care coordination needs.    Review of patient status, including review of consultants reports, relevant laboratory and other test results, and collaboration with appropriate care team members and the patient's provider was performed as part of comprehensive patient evaluation and provision of chronic care management services.    Goals Addressed            This Visit's Progress   . I need more help in the home (pt-stated)       Current Barriers:  [name] Knowledge Deficits related to managing multiple chronic diseases  . Lacks caregiver support.  . Film/video editor.  . Transportation barriers  Nurse Case Manager Clinical Goal(s):  [name] Over the next 90 days, patient will work with [facility]  to address needs related to Multiple chronic Disease Processes . Over the next 90 days, patient will work with [facility]  (community agency) to Increase Strength and regain some independence   Interventions:  . Provided education to patient re: Low Carb ADA Diet . Collaborated with PCP regarding P[facility] and [place] services in the home . Discussed plans with patient for ongoing care management follow up and provided patient with direct contact information for care management team . Patient reports [facility] home health has started and she has been very motivated to work with them.  . Encouraged patient to call Aldona Bar @ [facility] [phone] to discuss the process to obtain a new wheelchair Voice Mail left - she states they have not assisted with  new wheel chair and she is in need of this.  Hanley Seamen patient number for ramp referral-[facility] Kara [facility] [phone] [facility] . She states a slight improvement in her swelling but not much: She stated she attended her Vascular MD appt and her need for leg compression pump for the presence of Lymphedema. Requested she speak with [facility] about this when she talked to them about the wheelchair.  . We discussed up coming appts with Cardiology 7/28  . Collaboration call with LCSW to obtain new ramp resource Jenny Reichmann and [employer] [phone] prior to calling patient . Patient concerned about finances and the process of obtaining disability, will reach out to CCM LCSW to ask about this.   Patient Self Care Activities:  . Currently UNABLE TO independently perform ADLs or IADLs   Please see past updates related to this goal by clicking on the "Past Updates" button in the selected goal          The care management team will reach out to the patient again over the next 30 days.  The patient has been provided with contact information for the care management team and has been advised to call with any health related questions or concerns.    Merlene Morse Godson Pollan RN, BSN Nurse Case Editor, commissioning Family Practice/[facility] Care Management  [phone]) Business Mobile

## 2018-11-11 ENCOUNTER — Ambulatory Visit: Payer: Self-pay | Admitting: *Deleted

## 2018-11-11 DIAGNOSIS — R600 Localized edema: Secondary | ICD-10-CM

## 2018-11-11 DIAGNOSIS — J449 Chronic obstructive pulmonary disease, unspecified: Secondary | ICD-10-CM

## 2018-11-11 NOTE — Chronic Care Management (AMB) (Signed)
  Chronic Care Management   Follow Up Note   11/11/2018 Name: Trina Asch MRN: [id number] DOB: 1965-09-13  Referred by: [name], PA-C Reason for referral : No chief complaint on file.   Ricca Maziyah Vessel is a 53 y.o. year old female who is a primary care patient of [name], [place]. The CCM team was consulted for assistance with chronic disease management and care coordination needs.    Review of patient status, including review of consultants reports, relevant laboratory and other test results, and collaboration with appropriate care team members and the patient's provider was performed as part of comprehensive patient evaluation and provision of chronic care management services.    Goals Addressed            This Visit's Progress   . I need more help in the home (pt-stated)       Current Barriers:  [name] Knowledge Deficits related to managing multiple chronic diseases  . Lacks caregiver support.  . Film/video editor.  . Transportation barriers  Nurse Case Manager Clinical Goal(s):  [name] Over the next 90 days, patient will work with [facility]  to address needs related to Multiple chronic Disease Processes . Over the next 90 days, patient will work with [facility]  (community agency) to Increase Strength and regain some independence   Interventions:  . Collaborated with [place] and [place] with [facility] regarding the discontinuation of [place], [place] stating Medicaid only pays for a limited amount of PT/OT visits. Relayed patient misunderstood why services were discontinued. [place] going to work with [facility] to see if any more visits can be added.   . Discussed plans with patient for ongoing care management follow up and provided patient with direct contact information for care management team . Patient stated she was able to talk with [facility] about getting some help in the home through [facility] services,  . Patient called the ramp  ministry and she is on the list  . Patient to call [employer] to inquire about the cost of Lymphedema pump and directions to their store.   Patient Self Care Activities:   . Currently UNABLE TO independently perform ADLs or IADLs   Please see past updates related to this goal by clicking on the "Past Updates" button in the selected goal          The care management team will reach out to the patient again over the next 30 days.    Merlene Morse Phuc Kluttz RN, BSN Nurse Case Editor, commissioning Family Practice/[facility] Care Management  [phone]) Business Mobile

## 2018-11-15 ENCOUNTER — Ambulatory Visit: Payer: Self-pay | Admitting: Pharmacist

## 2018-11-15 DIAGNOSIS — J449 Chronic obstructive pulmonary disease, unspecified: Secondary | ICD-10-CM

## 2018-11-15 DIAGNOSIS — Z720 Tobacco use: Secondary | ICD-10-CM

## 2018-11-15 DIAGNOSIS — I10 Essential (primary) hypertension: Secondary | ICD-10-CM

## 2018-11-15 NOTE — Chronic Care Management (AMB) (Signed)
Chronic Care Management   Follow Up Note   11/15/2018 Name: Cheryl Chandler MRN: [id number] DOB: 01/12/1966  Referred by: [name], [name], PA-C Reason for referral : Chronic Care Management (Medication Management)   Cheryl Chandler is a 53 y.o. year old female who is a primary care patient of [name], [name], PA-C. The CCM team was consulted for assistance with chronic disease management and care coordination needs.    Spoke with patient telephonically today to review medications.   Review of patient status, including review of consultants reports, relevant laboratory and other test results, and collaboration with appropriate care team members and the patient's provider was performed as part of comprehensive patient evaluation and provision of chronic care management services.    Goals Addressed            This Visit's Progress     Patient Stated   . "I want to take care of my heart" (pt-stated)       Current Barriers:  Cheryl Chandler. Knowledge Deficits related to hx of cardiovascular disease ; dx of hypertension and hyperlipidemia;  . Tx furosemide 40 mg and lisinopril increased to 30 mg daily at last visit with PCP . Recording BP, reports BP 140-160s/60-70s, HR 60-70s . Lymphedema - prescribed compression wraps, but hasn't been able to get from the DME supplier yet. She notes that she does have some compression stockings that she can wear until the DME supplier gets them in. Cheryl Chandler. HLD tx: simvastatin 40 mg daily; Last lipid panel 10/04/2018: LDL 89, HDL 40, TG 148, TC 156  Pharmacist Clinical Goal(s):  Cheryl Chandler. Over the next 90 days, patient will work with PharmD and primary care provider to address needs related to optimal cardiovascular risk reduction  Interventions: . Patient endorses adherence with current medications . Encouraged patient to continue checking BP daily and continue to document . Working on tobacco cessation  Patient Self Care Activities:  . Self  administers medications as prescribed . Calls provider office for new concerns or questions  Please see past updates related to this goal by clicking on the "Past Updates" button in the selected goal      . "I'm having a hard time breathing" (pt-stated)       Current Barriers:  . 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 Currently managed on Dulera 100/5 mcg 2 puffs BID, montelukast 10 mg QHS, and using albuterol nebulizer QID.  Cheryl Chandler. Reported significant benefit with [facility] (Trelegy not covered by Medicaid) . Notes that she has cut back to smoking ~3 cigarettes per day. She has used gum before, and wants to quit, but is struggling to cut back more. Notes that triggers include stress/being upset.  . Today, reports that she has been unable to pick up Spiriva because she cannot afford the copays for all of her and her husband's medications o Notes that [facility] allow a $100 credit, and she had maxed this out until her next paycheck.  Pharmacist Clinical Goal(s):  Cheryl Chandler. Over the next 30 days, patient will work with PharmD and primary care provider to address needs related to optimized medication management  Interventions: . Collaborated with front desk staff - we have samples of Spiriva available. Informed patient that she can pick up 1 sample to bridge to when she can pick up her medications. Counseled on Respimat formulation vs Handihaler.  . Counseled on smoking cessation. Discussed use of gum to replace cigarette. Discussed  [place] Quitline, as patient can be provided free NRT w/ [place] Medicaid benefits. Provided with [place] Quitline flyers, and will continue to support moving forward.   Patient Self Care Activities:  . Self administers medications as prescribed . Calls provider office for new concerns or questions  Please see past updates related to this goal by clicking on the "Past Updates" button in the selected goal           Plan:  - Patient's husband will come by today to pick up Spiriva sample and [place] Quitline flyer.  - Will collaborate with RN CM on compression wrap/lymphedema pump needs - PharmD will outreach patient in the next 4-5 weeks for continued medication management support   [name], PharmD Clinical Pharmacist [place] [phone]

## 2018-11-15 NOTE — Patient Instructions (Signed)
Visit Information  Goals Addressed            This Visit's Progress     Patient Stated   . "I want to take care of my heart" (pt-stated)       Current Barriers:  [name] Knowledge Deficits related to hx of cardiovascular disease ; dx of hypertension and hyperlipidemia;  . Tx furosemide 40 mg and lisinopril increased to 30 mg daily at last visit with PCP . Recording BP, reports BP 140-160s/60-70s, HR 60-70s . Lymphedema - prescribed compression wraps, but hasn't been able to get from the DME supplier yet. She notes that she does have some compression stockings that she can wear until the DME supplier gets them in. [name] HLD tx: simvastatin 40 mg daily; Last lipid panel 10/04/2018: LDL 89, HDL 40, TG 148, TC 156  Pharmacist Clinical Goal(s):  [name] Over the next 90 days, patient will work with PharmD and primary care provider to address needs related to optimal cardiovascular risk reduction  Interventions: . Patient endorses adherence with current medications . Encouraged patient to continue checking BP daily and continue to document . Working on tobacco cessation  Patient Self Care Activities:  . Self administers medications as prescribed . Calls provider office for new concerns or questions  Please see past updates related to this goal by clicking on the "Past Updates" button in the selected goal      . "I'm having a hard time breathing" (pt-stated)       Current Barriers:  . 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 Currently managed on Dulera 100/5 mcg 2 puffs BID, montelukast 10 mg QHS, and using albuterol nebulizer QID.  [name] Reported significant benefit with Spiriva addition to [facility] (Trelegy not covered by Medicaid) . Notes that she has cut back to smoking ~3 cigarettes per day. She has used gum before, and wants to quit, but is struggling to cut back more. Notes that triggers include stress/being upset.  . Today, reports that she has been unable to pick  up Spiriva because she cannot afford the copays for all of her and her husband's medications o Notes that the pharmacists at [place] allow a $100 credit, and she had maxed this out until her next paycheck.  Pharmacist Clinical Goal(s):  [name] Over the next 30 days, patient will work with PharmD and primary care provider to address needs related to optimized medication management  Interventions: . Collaborated with front desk staff - we have samples of Spiriva available. Informed patient that she can pick up 1 sample to bridge to when she can pick up her medications. Counseled on Respimat formulation vs Handihaler.  . Counseled on smoking cessation. Discussed use of gum to replace cigarette. Discussed [place] Quitline, as patient can be provided free NRT w/ [place] Medicaid benefits. Provided with [place] Quitline flyers, and will continue to support moving forward.   Patient Self Care Activities:  . Self administers medications as prescribed . Calls provider office for new concerns or questions  Please see past updates related to this goal by clicking on the "Past Updates" button in the selected goal         The patient verbalized understanding of instructions provided today and declined a print copy of patient instruction materials.   Plan:  - Patient's husband will come by today to pick up Spiriva sample and [place] Quitline flyer.  - Will collaborate with RN CM on compression wrap/lymphedema pump needs - PharmD will  outreach patient in the next 4-5 weeks for continued medication management support   Catie Feliz Beamravis, PharmD Clinical Pharmacist [facility] [phone]

## 2018-11-18 ENCOUNTER — Ambulatory Visit: Payer: Medicaid Other | Admitting: Family Medicine

## 2018-11-22 ENCOUNTER — Ambulatory Visit: Payer: Medicaid Other | Admitting: Cardiovascular Disease

## 2018-11-23 ENCOUNTER — Telehealth: Payer: Self-pay | Admitting: Family Medicine

## 2018-11-23 ENCOUNTER — Ambulatory Visit: Payer: Self-pay | Admitting: *Deleted

## 2018-11-23 DIAGNOSIS — Z720 Tobacco use: Secondary | ICD-10-CM

## 2018-11-23 DIAGNOSIS — F419 Anxiety disorder, unspecified: Secondary | ICD-10-CM

## 2018-11-23 DIAGNOSIS — R600 Localized edema: Secondary | ICD-10-CM

## 2018-11-23 NOTE — Chronic Care Management (AMB) (Signed)
  Chronic Care Management   Follow Up Note   11/23/2018 Name: Cheryl Chandler MRN: [id number] DOB: 12-25-1965  Referred by: [name], PA-C Reason for referral : Chronic Care Management (COPD, Lypmph edema ) and Care Coordination (Ramp, Lymphedema edema pump,  )   [name] is a 53 y.o. year old female who is a primary care patient of [name], [place]. The CCM team was consulted for assistance with chronic disease management and care coordination needs.    Review of patient status, including review of consultants reports, relevant laboratory and other test results, and collaboration with appropriate care team members and the patient's provider was performed as part of comprehensive patient evaluation and provision of chronic care management services.    Goals Addressed            This Visit's Progress   . I need more help in the home (pt-stated)       Current Barriers:  [name] Knowledge Deficits related to managing multiple chronic diseases  . Lacks caregiver support.  . Film/video editor.  . Transportation barriers  Nurse Case Manager Clinical Goal(s):  [name] Over the next 90 days, patient will work with [facility]  to address needs related to Multiple chronic Disease Processes . Over the next 90 days, patient will work with [facility]  (community agency) to Increase Strength and regain some independence   Interventions:  . Provided education to patient re: Ways to reduce stress  . Discussed plans with patient for ongoing care management follow up and provided patient with direct contact information for care management team . Reviewed scheduled/upcoming provider appointments including: Upcoming call with [place] QUIT line counselor, Vascular MD, and [employer] supply who is delivering her Lymph edema pump . Patient stated she was able to talk with [facility] about getting some help in the home through [place] and they are coming  out to evaluate her. . Patient called the ramp ministry and she is on the list, they have come and measured her steps for the ramp. Plan to come build it as soon as the ministry gets more lumber. . Discussed with patient the continued need need for a wheelchair, patient was able to talk with [place] to inquire about the process.  [name] Collaboration with PCP office to fax over necessary information to [employer].    Patient Self Care Activities:   . Currently UNABLE TO independently perform ADLs or IADLs   Please see past updates related to this goal by clicking on the "Past Updates" button in the selected goal          The care management team will reach out to the patient again over the next 30 days.  The patient has been provided with contact information for the care management team and has been advised to call with any health related questions or concerns.    Merlene Morse Marsean Elkhatib RN, BSN Nurse Case Editor, commissioning Family Practice/[facility] Care Management  [phone]) Business Mobile

## 2018-11-23 NOTE — Telephone Encounter (Signed)
Copied from [place] [id number]. Topic: General - Other >> Nov 23, 2018 12:20 PM Reyne Dumas L wrote: Reason for CRM:   Pt states that she wants to let the office know that she has [facility] coming out in August and the folks that are building a ramp for her have also measured her stairs.  Pt states that she is also trying to quit smoking and has been off of cigarettes for the past week.  Pt states that she would like to talk to [place] about this. Pt can be reached at [phone]

## 2018-11-23 NOTE — Telephone Encounter (Signed)
This is all great news, I will give her a call.

## 2018-11-24 NOTE — Telephone Encounter (Signed)
Hi Cheryl Chandler,  Thank you. Can you guys fax a script to [facility] for the w/c and shower seat? Fax # [phone]

## 2018-11-24 NOTE — Telephone Encounter (Signed)
Order written, will get signature and fax. 

## 2018-11-24 NOTE — Telephone Encounter (Signed)
Pt calling Janci back.  States that [place] needs to know that [facility] needs a fax for the wheelchair and tub/bench seat. Fax number is:  [phone].

## 2018-11-25 NOTE — Telephone Encounter (Signed)
Fax resent.

## 2018-11-25 NOTE — Telephone Encounter (Signed)
Patient states that fax was never received. Requesting it be resent to same fax #

## 2018-11-26 DIAGNOSIS — J449 Chronic obstructive pulmonary disease, unspecified: Secondary | ICD-10-CM | POA: Diagnosis not present

## 2018-11-26 DIAGNOSIS — M545 Low back pain: Secondary | ICD-10-CM | POA: Diagnosis not present

## 2018-11-26 NOTE — Telephone Encounter (Signed)
Thank you so much [place]!

## 2018-11-29 ENCOUNTER — Telehealth: Payer: Self-pay | Admitting: Family Medicine

## 2018-11-29 NOTE — Telephone Encounter (Signed)
Copied from [place] [id number]. Topic: General - Other >> Nov 29, 2018  4:24 PM Keene Breath wrote: Reason for CRM: [place] called to request doctor's notes for an order for a wheelchair for the patient.  The notes are for the visit on 11/24/18.  Please advise and call back at [phone]

## 2018-11-30 ENCOUNTER — Ambulatory Visit: Payer: Self-pay | Admitting: *Deleted

## 2018-11-30 ENCOUNTER — Ambulatory Visit: Payer: Self-pay | Admitting: Pharmacist

## 2018-11-30 DIAGNOSIS — J449 Chronic obstructive pulmonary disease, unspecified: Secondary | ICD-10-CM

## 2018-11-30 DIAGNOSIS — R600 Localized edema: Secondary | ICD-10-CM

## 2018-11-30 DIAGNOSIS — Z720 Tobacco use: Secondary | ICD-10-CM

## 2018-11-30 NOTE — Patient Instructions (Signed)
Visit Information  Goals Addressed            This Visit's Progress     Patient Stated   . "I'm having a hard time breathing" (pt-stated)       Current Barriers:  . 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 Currently managed on Dulera 100/5 mcg 2 puffs BID, montelukast 10 mg QHS, and using albuterol nebulizer QID.  [name] Reported significant benefit with Spiriva addition to [facility] (Trelegy not covered by Medicaid) . Patient reports QUIT DATE of 11/14/2018. Notes that she contacted the [place] and has been receiving support, as well as receiving nicotine gum.  Pharmacist Clinical Goal(s):  [name] Over the next 30 days, patient will work with PharmD and primary care provider to address needs related to optimized medication management  Interventions: . Celebrated patient's success with her and informed her of how proud we are of her. CCM team will continue to support patient through the next few weeks   Patient Self Care Activities:  . Self administers medications as prescribed . Calls provider office for new concerns or questions  Please see past updates related to this goal by clicking on the "Past Updates" button in the selected goal         The patient verbalized understanding of instructions provided today and declined a print copy of patient instruction materials.   Plan:  - Will outreach patient in the next 4-6 weeks for continued medication management support  [name], PharmD Clinical Pharmacist [place] [phone]

## 2018-11-30 NOTE — Telephone Encounter (Signed)
I think she's working with Merlene Morse on this

## 2018-11-30 NOTE — Chronic Care Management (AMB) (Signed)
  Chronic Care Management   Follow Up Note   11/30/2018 Name: Cheryl Chandler MRN: [id number] DOB: 02-11-1966  Referred by: [name], PA-C Reason for referral : Chronic Care Management (Medication Management)   Cheryl Chandler is a 53 y.o. year old female who is a primary care patient of [name], PA-C. The CCM team was consulted for assistance with chronic disease management and care coordination needs.    Contacted patient telephonically to follow up on smoking cessation   Review of patient status, including review of consultants reports, relevant laboratory and other test results, and collaboration with appropriate care team members and the patient's provider was performed as part of comprehensive patient evaluation and provision of chronic care management services.    Goals Addressed            This Visit's Progress     Patient Stated   . "I'm having a hard time breathing" (pt-stated)       Current Barriers:  . 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 Currently managed on Dulera 100/5 mcg 2 puffs BID, montelukast 10 mg QHS, and using albuterol nebulizer QID.  [name] Reported significant benefit with Spiriva addition to [facility] (Trelegy not covered by Medicaid) . Patient reports QUIT DATE of 11/14/2018. Notes that she contacted the [place] and has been receiving support, as well as receiving nicotine gum.  Pharmacist Clinical Goal(s):  [name] Over the next 30 days, patient will work with PharmD and primary care provider to address needs related to optimized medication management  Interventions: . Celebrated patient's success with her and informed her of how proud we are of her. CCM team will continue to support patient through the next few weeks   Patient Self Care Activities:  . Self administers medications as prescribed . Calls provider office for new concerns or questions  Please see past updates related  to this goal by clicking on the "Past Updates" button in the selected goal          Plan:  - Will outreach patient in the next 4-6 weeks for continued medication management support  [name], PharmD Clinical Pharmacist [place] [phone]

## 2018-11-30 NOTE — Telephone Encounter (Signed)
I will send them today. Thank you.

## 2018-12-01 ENCOUNTER — Ambulatory Visit: Payer: Self-pay | Admitting: *Deleted

## 2018-12-01 DIAGNOSIS — J449 Chronic obstructive pulmonary disease, unspecified: Secondary | ICD-10-CM

## 2018-12-01 DIAGNOSIS — Z720 Tobacco use: Secondary | ICD-10-CM

## 2018-12-01 DIAGNOSIS — R600 Localized edema: Secondary | ICD-10-CM

## 2018-12-01 NOTE — Chronic Care Management (AMB) (Signed)
  Chronic Care Management   Follow Up Note   12/01/2018 Name: Cheryl Chandler MRN: [id number] DOB: 1966/03/18  Referred by: [name], PA-C Reason for referral : No chief complaint on file.   [name] is a 53 y.o. year old female who is a primary care patient of [name], [place]. The CCM team was consulted for assistance with chronic disease management and care coordination needs.    Review of patient status, including review of consultants reports, relevant laboratory and other test results, and collaboration with appropriate care team members and the patient's provider was performed as part of comprehensive patient evaluation and provision of chronic care management services.    Goals Addressed            This Visit's Progress   . I need more help in the home (pt-stated)       Current Barriers:  [name] Knowledge Deficits related to managing multiple chronic diseases  . Lacks caregiver support.  . Film/video editor.  . Transportation barriers  Nurse Case Manager Clinical Goal(s):  [name] Over the next 90 days, patient will work with [facility]  to address needs related to Multiple chronic Disease Processes . Over the next 90 days, patient will work with [facility]  (community agency) to Increase Strength and regain some independence   Interventions:  . Discussed plans with patient for ongoing care management follow up and provided patient with direct contact information for care management team  . Cal placed to [place] supply related to patient stating they told her they needed more information . Necessary supporting notes faxed over  to [place] Fax # [phone] for patient to obtain large wheelchair and shower seat.  [name] Congratulated and encouraged patient on continued efforts on stopping smoking, patient is very excited about her journey towards better health.  . Made her aware notes were faxed and [facility] will  need to fax information regaurding pneumatic compression hose pump for legs.  . Patient called to let me know her husbands phone was out of order.  . Reviewed her current plan to quit smoking, obtain help in the home, get a ramp, wheelchair, shower seat and pneumatic compression hose.  Patient Self Care Activities:   . Currently UNABLE TO independently perform ADLs or IADLs   Please see past updates related to this goal by clicking on the "Past Updates" button in the selected goal          The patient has been provided with contact information for the care management team and has been advised to call with any health related questions or concerns.   Merlene Morse Cheria Sadiq RN, BSN Nurse Case Editor, commissioning Family Practice/[facility] Care Management  [phone]) Business Mobile

## 2018-12-02 NOTE — Chronic Care Management (AMB) (Signed)
  Chronic Care Management   Follow Up Note   12/02/2018 Name: Cheryl Chandler MRN: [id number] DOB: Jan 23, 1966  Referred by: [name], PA-C Reason for referral : Chronic Care Management (copd, BLE, diet) and Care Coordination (DME/[facility])   Cheryl Chandler is a 53 y.o. year old female who is a primary care patient of [name], PA-C. The CCM team was consulted for assistance with chronic disease management and care coordination needs.    Review of patient status, including review of consultants reports, relevant laboratory and other test results, and collaboration with appropriate care team members and the patient's provider was performed as part of comprehensive patient evaluation and provision of chronic care management services.    Goals Addressed            This Visit's Progress   . I need more help in the home (pt-stated)       Current Barriers:  [name] Knowledge Deficits related to managing multiple chronic diseases  . Lacks caregiver support.  . Film/video editor.  . Transportation barriers  Nurse Case Manager Clinical Goal(s):  [name] Over the next 90 days, patient will work with [facility]  to address needs related to Multiple chronic Disease Processes . Over the next 90 days, patient will work with [facility]  (community agency) to Increase Strength and regain some independence   Interventions:  . Discussed plans with patient for ongoing care management follow up and provided patient with direct contact information for care management team  . Cal placed to [place] supply related to patient stating they told her they needed more information . Necessary supporting notes faxed over  to [place] Fax # [phone] for patient to obtain large wheelchair and shower seat.  [name] Congratulated and encouraged patient on continued efforts on stopping smoking, patient is very excited about her journey towards better health.  . Made her  aware notes were faxed and [facility] will need to fax information regaurding pneumatic compression hose pump for legs.   Patient Self Care Activities:   . Currently UNABLE TO independently perform ADLs or IADLs   Please see past updates related to this goal by clicking on the "Past Updates" button in the selected goal          The care management team will reach out to the patient again over the next 30 days.  The patient has been provided with contact information for the care management team and has been advised to call with any health related questions or concerns.    Merlene Morse Johnay Mano RN, BSN Nurse Case Editor, commissioning Family Practice/[facility] Care Management  [phone]) Business Mobile.

## 2018-12-05 DIAGNOSIS — J449 Chronic obstructive pulmonary disease, unspecified: Secondary | ICD-10-CM | POA: Diagnosis not present

## 2018-12-07 ENCOUNTER — Other Ambulatory Visit: Payer: Self-pay | Admitting: Family Medicine

## 2018-12-07 ENCOUNTER — Telehealth: Payer: Self-pay

## 2018-12-07 ENCOUNTER — Ambulatory Visit (INDEPENDENT_AMBULATORY_CARE_PROVIDER_SITE_OTHER): Payer: Medicaid Other | Admitting: Psychiatry

## 2018-12-07 ENCOUNTER — Other Ambulatory Visit: Payer: Self-pay

## 2018-12-07 DIAGNOSIS — Z5329 Procedure and treatment not carried out because of patient's decision for other reasons: Secondary | ICD-10-CM

## 2018-12-07 DIAGNOSIS — Z91199 Patient's noncompliance with other medical treatment and regimen due to unspecified reason: Secondary | ICD-10-CM

## 2018-12-07 NOTE — Progress Notes (Signed)
No response to call 

## 2018-12-09 ENCOUNTER — Telehealth: Payer: Self-pay

## 2018-12-09 ENCOUNTER — Ambulatory Visit: Payer: Self-pay | Admitting: *Deleted

## 2018-12-09 DIAGNOSIS — J449 Chronic obstructive pulmonary disease, unspecified: Secondary | ICD-10-CM

## 2018-12-09 DIAGNOSIS — I10 Essential (primary) hypertension: Secondary | ICD-10-CM

## 2018-12-09 DIAGNOSIS — R600 Localized edema: Secondary | ICD-10-CM

## 2018-12-09 NOTE — Chronic Care Management (AMB) (Signed)
Chronic Care Management   Follow Up Note   12/09/2018 Name: Cheryl Chandler MRN: [id number] DOB: 10/06/1965  Referred by: [name], [name], PA-C Reason for referral : Chronic Care Management (Lymphedema ) and Care Coordination (DME)   Cheryl Chandler is a 53 y.o. year old female who is a primary care patient of [name], [name], PA-C. The CCM team was consulted for assistance with chronic disease management and care coordination needs.    Review of patient status, including review of consultants reports, relevant laboratory and other test results, and collaboration with appropriate care team members and the patient's provider was performed as part of comprehensive patient evaluation and provision of chronic care management services.    Outpatient Encounter Medications as of 12/09/2018  Medication Sig Note  . albuterol (PROVENTIL) (2.5 MG/3ML) 0.083% nebulizer solution INHALE CONTENTS OF 1 VIAL IN NEBULIZER EVERY 4 HOURS IF NEEDED FOR WHEEZING OR SHORTNESS OF BREATH. 10/11/2018: Four times daily   . citalopram (CELEXA) 20 MG tablet TAKE 1 TABLET BY MOUTH ONCE DAILY.   . DULERA 100-5 MCG/ACT AERO INHALE 2 PUFFS BY MOUTH TWICE A DAY. RINSE MOUTH AFTER USE.   [name]. EPINEPHrine 0.3 mg/0.3 mL IJ SOAJ injection USE AS DIRECTED. (Patient not taking: Reported on 11/15/2018)   . furosemide (LASIX) 40 MG tablet TAKE 1 TABLET BY MOUTH ONCE DAILY   . gabapentin (NEURONTIN) 300 MG capsule TAKE 1 CAPSULE BY MOUTH 3 TIMES DAILY ASNEEDED   . levothyroxine (SYNTHROID) 50 MCG tablet TAKE 1 TABLET BY MOUTH ONCE DAILY ON AN EMPTY STOMACH. WAIT 30 MINUTES BEFORE TAKING OTHER MEDS.   [name]. lisinopril (ZESTRIL) 30 MG tablet TAKE 1 TABLET BY MOUTH ONCE DAILY   . meloxicam (MOBIC) 15 MG tablet TAKE 1 TABLET BY MOUTH ONCE A DAY WITH FOOD OR MILK IF NEEDE 10/11/2018: QAM  . methocarbamol (ROBAXIN) 750 MG tablet TAKE 1-2 TABLETS BY MOUTH 3 TIMES DAILY IF NEEDED FOR BACK PAIN.   . montelukast (SINGULAIR) 10  MG tablet Take 1 tablet (10 mg total) by mouth daily.   . nitroGLYCERIN (NITROSTAT) 0.4 MG SL tablet DISSOLVE 1 TABLET UNDER TONGUE AT ONSET OF CHEST PAIN. REPEAT IN 5 MIN IF NOT RESOLVED, MAX 3 DOSES. 911 IF NEEDED.   [name]. omeprazole (PRILOSEC) 20 MG capsule Take 1 capsule (20 mg total) by mouth daily.   [name]. PROAIR HFA 108 (90 Base) MCG/ACT inhaler inhale 2 puffs by mouth every 4 to 6 hours if needed for wheezing (Patient not taking: Reported on 11/15/2018)   . simvastatin (ZOCOR) 40 MG tablet TAKE 1 TABLET BY MOUTH ONCE DAILY   . tiotropium (SPIRIVA HANDIHALER) 18 MCG inhalation capsule Place 1 capsule (18 mcg total) into inhaler and inhale daily. (Patient not taking: Reported on 11/15/2018)   . traMADol (ULTRAM) 50 MG tablet TAKE 1 TABLET BY MOUTH EVERY 8 HOURS AS NEEDED PAIN 10/11/2018: QAM  . Vitamin D, Ergocalciferol, (DRISDOL) 1.25 MG (50000 UT) CAPS capsule Take 1 capsule (50,000 Units total) by mouth every 7 (seven) days.    No facility-administered encounter medications on file as of 12/09/2018.      Goals Addressed            This Visit's Progress   . I need more help in the home (pt-stated)       Current Barriers:  [name]. Knowledge Deficits related to managing multiple chronic diseases  . Lacks caregiver support.  . Corporate treasurerinancial Constraints.  . Transportation barriers  Nurse Case Manager Clinical Goal(s):  .  Over the next 90 days, patient will work with [facility]  to address needs related to Multiple chronic Disease Processes . Over the next 90 days, patient will work with [facility]  (community agency) to Increase Strength and regain some independence   Interventions:  . Discussed plans with patient for ongoing care management follow up and provided patient with direct contact information for care management team  . Call placed to [place] supply related to patient had not heard from them about her wheelchair or pneumatic compression hose with patient on the phone so we could  discuss next steps. Jason at [employer] he has not received the information needed from Vein specialist, but wheel chair is in process of being authorized. Corene Cornea stated he would email the form to fill out for pneumatic pump. Corene Cornea also stated he would assist patient in adjusting shower seat provided related to patient can not get it to fit inside her shower.  [name] Congratulated and encouraged patient on continued efforts on stopping smoking, patient is very excited about her journey towards better health. Patient stated on Aug 20 it will be one month since quiting and she is feeling very proud of herself.  Patient Self Care Activities:   . Currently UNABLE TO independently perform ADLs or IADLs   Please see past updates related to this goal by clicking on the "Past Updates" button in the selected goal          The patient has been provided with contact information for the care management team and has been advised to call with any health related questions or concerns.    Merlene Morse Twanda Stakes RN, BSN Nurse Case Editor, commissioning Family Practice/[facility] Care Management  [phone]) Business Mobile

## 2018-12-12 ENCOUNTER — Ambulatory Visit: Payer: Self-pay | Admitting: Licensed Clinical Social Worker

## 2018-12-12 ENCOUNTER — Other Ambulatory Visit: Payer: Self-pay

## 2018-12-12 DIAGNOSIS — F419 Anxiety disorder, unspecified: Secondary | ICD-10-CM

## 2018-12-12 NOTE — Chronic Care Management (AMB) (Signed)
Care Management   Follow Up Note   12/12/2018 Name: Cheryl Chandler MRN: [id number] DOB: 07-Jul-1965  Referred by: [name], PA-C Reason for referral : Care Coordination   Cheryl Chandler is a 53 y.o. year old female who is a primary care patient of [name], [place]. The care management team was consulted for assistance with care management and care coordination needs.    Review of patient status, including review of consultants reports, relevant laboratory and other test results, and collaboration with appropriate care team members and the patient's provider was performed as part of comprehensive patient evaluation and provision of chronic care management services.    Goals Addressed     "I need to focus on taking better care of myself" (pt-stated)       Current Barriers:   Financial constraints  Mental Health Concerns   Social Isolation  Limited education about mental health support resources that are available to her within the local area*  Cognitive Deficits  Lacks knowledge of community resource: Grief support resources/programs that are offered free of charge  within the area  Clinical Social Work Clinical Goal(s):   Over the next 90 days, client will work with SW to address concerns related to experiencing ongoing symptoms of depression, sadness and grief since the passing of her mother  Over the next 90 days, patient will work with LCSW to address needs related to implementing appropriate self-care and depression management.   Interventions:  Patient interviewed and appropriate assessments performed  Provided mental health counseling with regards to patient's loss. Patient reports being active with [facility]. She shares that she has been in contact with a caseworker there that has spent time with her on the phone and has sent her resource information on available mental health support resources within the area.  Patient denies wanting LCSW to make a referral for grief counseling through [facility] again but was receptive to coping skill and resource education/information that was provided during session today.  LCSW assisted patient with completing application for [employer]. Patient agreeable to mail back application to [employer] once completed.  Provided patient with information about managing grief and depression within her daily life. Patient reports that she talks out loud to her mother's picture and this is very helpful to her.  Provided reflective listening and implemented appropriate interventions to help suppport patient and her emotional needs   Discussed plans with patient for ongoing care management follow up and provided patient with direct contact information for care management team  Advised patient to contact CCM team with any urgent case management needs.  Assisted patient/caregiver with obtaining information about health plan benefits  LCSW completed referral for family to have a wheelchair ramp build at their residence due to ongoing mobility concerns. Patient reports that [facility] and her husband completed a home visit/assessment last week. They do not have enough funds/supplies to complete her ramp yet but plan to do this as soon as they are able.   Patient Self Care Activities:   Attends all scheduled provider appointments  Calls provider office for new concerns or questions  Please see past updates related to this goal by clicking on the "Past Updates" button in the selected goal      Telephone follow up appointment with care management team member scheduled [id number] month  Eula Fried, BSW, MSW, [email or web address] Phone: [phone]

## 2018-12-13 ENCOUNTER — Ambulatory Visit: Payer: Self-pay | Admitting: *Deleted

## 2018-12-13 DIAGNOSIS — R600 Localized edema: Secondary | ICD-10-CM

## 2018-12-13 NOTE — Chronic Care Management (AMB) (Signed)
Chronic Care Management   Follow Up Note   12/13/2018 Name: Cheryl Chandler Marie Fager MRN: [id number] DOB: 01/18/1966  Referred by: [name], [name], PA-C Reason for referral : Care Coordination (DME/[facility])   Nikelle Guadalupe DawnMarie Bidinger is a 53 y.o. year old female who is a primary care patient of [name], [name], PA-C. The CCM team was consulted for assistance with chronic disease management and care coordination needs.    Review of patient status, including review of consultants reports, relevant laboratory and other test results, and collaboration with appropriate care team members and the patient's provider was performed as part of comprehensive patient evaluation and provision of chronic care management services.    Outpatient Encounter Medications as of 12/13/2018  Medication Sig Note  . albuterol (PROVENTIL) (2.5 MG/3ML) 0.083% nebulizer solution INHALE CONTENTS OF 1 VIAL IN NEBULIZER EVERY 4 HOURS IF NEEDED FOR WHEEZING OR SHORTNESS OF BREATH. 10/11/2018: Four times daily   . citalopram (CELEXA) 20 MG tablet TAKE 1 TABLET BY MOUTH ONCE DAILY.   . DULERA 100-5 MCG/ACT AERO INHALE 2 PUFFS BY MOUTH TWICE A DAY. RINSE MOUTH AFTER USE.   [name]. EPINEPHrine 0.3 mg/0.3 mL IJ SOAJ injection USE AS DIRECTED. (Patient not taking: Reported on 11/15/2018)   . furosemide (LASIX) 40 MG tablet TAKE 1 TABLET BY MOUTH ONCE DAILY   . gabapentin (NEURONTIN) 300 MG capsule TAKE 1 CAPSULE BY MOUTH 3 TIMES DAILY ASNEEDED   . levothyroxine (SYNTHROID) 50 MCG tablet TAKE 1 TABLET BY MOUTH ONCE DAILY ON AN EMPTY STOMACH. WAIT 30 MINUTES BEFORE TAKING OTHER MEDS.   [name]. lisinopril (ZESTRIL) 30 MG tablet TAKE 1 TABLET BY MOUTH ONCE DAILY   . meloxicam (MOBIC) 15 MG tablet TAKE 1 TABLET BY MOUTH ONCE A DAY WITH FOOD OR MILK IF NEEDE 10/11/2018: QAM  . methocarbamol (ROBAXIN) 750 MG tablet TAKE 1-2 TABLETS BY MOUTH 3 TIMES DAILY IF NEEDED FOR BACK PAIN.   . montelukast (SINGULAIR) 10 MG tablet Take  1 tablet (10 mg total) by mouth daily.   . nitroGLYCERIN (NITROSTAT) 0.4 MG SL tablet DISSOLVE 1 TABLET UNDER TONGUE AT ONSET OF CHEST PAIN. REPEAT IN 5 MIN IF NOT RESOLVED, MAX 3 DOSES. 911 IF NEEDED.   [name]. omeprazole (PRILOSEC) 20 MG capsule Take 1 capsule (20 mg total) by mouth daily.   [name]. PROAIR HFA 108 (90 Base) MCG/ACT inhaler inhale 2 puffs by mouth every 4 to 6 hours if needed for wheezing (Patient not taking: Reported on 11/15/2018)   . simvastatin (ZOCOR) 40 MG tablet TAKE 1 TABLET BY MOUTH ONCE DAILY   . tiotropium (SPIRIVA HANDIHALER) 18 MCG inhalation capsule Place 1 capsule (18 mcg total) into inhaler and inhale daily. (Patient not taking: Reported on 11/15/2018)   . traMADol (ULTRAM) 50 MG tablet TAKE 1 TABLET BY MOUTH EVERY 8 HOURS AS NEEDED PAIN 10/11/2018: QAM  . Vitamin D, Ergocalciferol, (DRISDOL) 1.25 MG (50000 UT) CAPS capsule Take 1 capsule (50,000 Units total) by mouth every 7 (seven) days.    No facility-administered encounter medications on file as of 12/13/2018.      Goals Addressed            This Visit's Progress   . I need more help in the home (pt-stated)       Current Barriers:  [name]. Knowledge Deficits related to managing multiple chronic diseases  . Lacks caregiver support.  . Corporate treasurerinancial Constraints.  . Transportation barriers  Nurse Case Manager Clinical Goal(s):  [name]. Over  the next 90 days, patient will work with [facility]  to address needs related to Multiple chronic Disease Processes . Over the next 90 days, patient will work with [facility]  (community agency) to Increase Strength and regain some independence   Interventions:  . Discussed plans with patient for ongoing care management follow up and provided patient with direct contact information for care management team  . Call placed to [place] supply related to patient had not heard from them about her wheelchair or pneumatic compression hose with patient on the phone so we could discuss next  steps. Jason at [employer] he has not received the information needed from Vein specialist, but wheel chair is in process of being authorized. Corene Cornea stated he would email the form to fill out for pneumatic pump. Corene Cornea also stated he would assist patient in adjusting shower seat provided related to patient can not get it to fit inside her shower.  [name] Congratulated and encouraged patient on continued efforts on stopping smoking, patient is very excited about her journey towards better health. Patient stated on Aug 20 it will be one month since quiting and she is feeling very proud of herself. . Call placed to [facility] [phone], left a VM. [facility]([phone]) [phone] Fax [phone]) needs paperwork ordering the pneumatic compression pump from Dr. Delana Meyer. Will await a call back . [name] Received a callback from [place] at [facility]. Made her aware I had paperwork that needed to be filled out for patient to obtain pneumatic compression pump was able to email nescesary paperwork to [email or web address]  and plan to fax papers to [phone] tomorrow.   Patient Self Care Activities:   . Currently UNABLE TO independently perform ADLs or IADLs   Please see past updates related to this goal by clicking on the "Past Updates" button in the selected goal          The patient has been provided with contact information for the care management team and has been advised to call with any health related questions or concerns.    Merlene Morse Virat Prather RN, BSN Nurse Case Editor, commissioning Family Practice/[facility] Care Management  [phone]) Business Mobile

## 2018-12-14 ENCOUNTER — Telehealth: Payer: Self-pay

## 2018-12-20 ENCOUNTER — Ambulatory Visit: Payer: Self-pay | Admitting: *Deleted

## 2018-12-20 DIAGNOSIS — R600 Localized edema: Secondary | ICD-10-CM

## 2018-12-20 DIAGNOSIS — J449 Chronic obstructive pulmonary disease, unspecified: Secondary | ICD-10-CM

## 2018-12-20 NOTE — Chronic Care Management (AMB) (Signed)
Chronic Care Management   Follow Up Note   12/20/2018 Name: Cheryl Chandler MRN: [id number] DOB: 02/23/1966  Referred by: [name], PA-C Reason for referral : Chronic Care Management (Bilateral leg edema) and Care Coordination (DME, PCS )   Cheryl Chandler is a 53 y.o. year old female who is a primary care patient of [name], PA-C. The CCM team was consulted for assistance with chronic disease management and care coordination needs.    Review of patient status, including review of consultants reports, relevant laboratory and other test results, and collaboration with appropriate care team members and the patient's provider was performed as part of comprehensive patient evaluation and provision of chronic care management services.    SDOH (Social Determinants of Health) screening performed today: Financial Strain  Depression   Physical Activity. See Care Plan for related entries.   Outpatient Encounter Medications as of 12/20/2018  Medication Sig Note  . albuterol (PROVENTIL) (2.5 MG/3ML) 0.083% nebulizer solution INHALE CONTENTS OF 1 VIAL IN NEBULIZER EVERY 4 HOURS IF NEEDED FOR WHEEZING OR SHORTNESS OF BREATH. 10/11/2018: Four times daily   . citalopram (CELEXA) 20 MG tablet TAKE 1 TABLET BY MOUTH ONCE DAILY.   . DULERA 100-5 MCG/ACT AERO INHALE 2 PUFFS BY MOUTH TWICE A DAY. RINSE MOUTH AFTER USE.   [name] EPINEPHrine 0.3 mg/0.3 mL IJ SOAJ injection USE AS DIRECTED. (Patient not taking: Reported on 11/15/2018)   . furosemide (LASIX) 40 MG tablet TAKE 1 TABLET BY MOUTH ONCE DAILY   . gabapentin (NEURONTIN) 300 MG capsule TAKE 1 CAPSULE BY MOUTH 3 TIMES DAILY ASNEEDED   . levothyroxine (SYNTHROID) 50 MCG tablet TAKE 1 TABLET BY MOUTH ONCE DAILY ON AN EMPTY STOMACH. WAIT 30 MINUTES BEFORE TAKING OTHER MEDS.   [name] lisinopril (ZESTRIL) 30 MG tablet TAKE 1 TABLET BY MOUTH ONCE DAILY   . meloxicam (MOBIC) 15 MG tablet TAKE 1 TABLET BY MOUTH ONCE A DAY WITH FOOD OR  MILK IF NEEDE 10/11/2018: QAM  . methocarbamol (ROBAXIN) 750 MG tablet TAKE 1-2 TABLETS BY MOUTH 3 TIMES DAILY IF NEEDED FOR BACK PAIN.   . montelukast (SINGULAIR) 10 MG tablet Take 1 tablet (10 mg total) by mouth daily.   . nitroGLYCERIN (NITROSTAT) 0.4 MG SL tablet DISSOLVE 1 TABLET UNDER TONGUE AT ONSET OF CHEST PAIN. REPEAT IN 5 MIN IF NOT RESOLVED, MAX 3 DOSES. 911 IF NEEDED.   [name] omeprazole (PRILOSEC) 20 MG capsule Take 1 capsule (20 mg total) by mouth daily.   [name] PROAIR HFA 108 (90 Base) MCG/ACT inhaler inhale 2 puffs by mouth every 4 to 6 hours if needed for wheezing (Patient not taking: Reported on 11/15/2018)   . simvastatin (ZOCOR) 40 MG tablet TAKE 1 TABLET BY MOUTH ONCE DAILY   . tiotropium (SPIRIVA HANDIHALER) 18 MCG inhalation capsule Place 1 capsule (18 mcg total) into inhaler and inhale daily. (Patient not taking: Reported on 11/15/2018)   . traMADol (ULTRAM) 50 MG tablet TAKE 1 TABLET BY MOUTH EVERY 8 HOURS AS NEEDED PAIN 10/11/2018: QAM  . Vitamin D, Ergocalciferol, (DRISDOL) 1.25 MG (50000 UT) CAPS capsule Take 1 capsule (50,000 Units total) by mouth every 7 (seven) days.    No facility-administered encounter medications on file as of 12/20/2018.      Goals Addressed            This Visit's Progress   . I need more help in the home (pt-stated)       Current Barriers:  .  Knowledge Deficits related to managing multiple chronic diseases  . Lacks caregiver support.  . Corporate treasurerinancial Constraints.  . Transportation barriers  Nurse Case Manager Clinical Goal(s):  [name]. Over the next 90 days, [facility]  to address needs related to Multiple chronic Disease Processes . Over the next 90 days, [facility]  ([facility] to Increase Strength and regain some independence   Interventions:  . Discussed plans with patient for ongoing care management follow up and provided patient with direct contact information for care management team  .  Patient states she was contacted by [facility]. They have sent in the application, patient has chosen [facility] care service. She will find out in the next few weeks if she is approved.  . Patient stated her wheelchair will be delivered tomorrow.  . Requested patient ask the DME company [employer] who is delivering w/c if they have received [facility]. Patient verbalized understanding. If they haven't plan to print paperwork for her to take to upcoming appointments.  . Reviewed upcoming appointments: 9/10 Lateef(pain management) 9/14 Shnier (vein & Vasc) and 9/18 PCP.   Patient Self Care Activities:   . Currently UNABLE TO independently perform ADLs or IADLs   Please see past updates related to this goal by clicking on the "Past Updates" button in the selected goal          The care management team will reach out to the patient again over the next 30 days.  The patient has been provided with contact information for the care management team and has been advised to call with any health related questions or concerns.   Ma RingsJanci Calla Wedekind RN, BSN Nurse Case Education officer, communityManager [facility]  [phone]) Business Mobile

## 2018-12-23 ENCOUNTER — Ambulatory Visit: Payer: Self-pay | Admitting: *Deleted

## 2018-12-23 DIAGNOSIS — Z720 Tobacco use: Secondary | ICD-10-CM

## 2018-12-23 DIAGNOSIS — J449 Chronic obstructive pulmonary disease, unspecified: Secondary | ICD-10-CM

## 2018-12-23 DIAGNOSIS — R600 Localized edema: Secondary | ICD-10-CM

## 2018-12-23 DIAGNOSIS — I10 Essential (primary) hypertension: Secondary | ICD-10-CM

## 2018-12-23 NOTE — Chronic Care Management (AMB) (Signed)
Chronic Care Management   Follow Up Note   12/23/2018 Name: Cheryl Chandler MRN: [id number] DOB: 02/22/1966  Referred by: [name], [name], PA-C Reason for referral : Chronic Care Management (bilateral leg edema ) and Care Coordination (Ramp/Wheelchair/Shower chair/Shower Wand deliver )   Cheryl Chandler is a 53 y.o. year old female who is a primary care patient of [name], [name], PA-C. The CCM team was consulted for assistance with chronic disease management and care coordination needs.    Review of patient status, including review of consultants reports, relevant laboratory and other test results, and collaboration with appropriate care team members and the patient's provider was performed as part of comprehensive patient evaluation and provision of chronic care management services.    SDOH (Social Determinants of Health) screening performed today: Financial Strain  Tobacco Use Stress Physical Activity. See Care Plan for related entries.   Outpatient Encounter Medications as of 12/23/2018  Medication Sig Note  . albuterol (PROVENTIL) (2.5 MG/3ML) 0.083% nebulizer solution INHALE CONTENTS OF 1 VIAL IN NEBULIZER EVERY 4 HOURS IF NEEDED FOR WHEEZING OR SHORTNESS OF BREATH. 10/11/2018: Four times daily   . citalopram (CELEXA) 20 MG tablet TAKE 1 TABLET BY MOUTH ONCE DAILY.   . DULERA 100-5 MCG/ACT AERO INHALE 2 PUFFS BY MOUTH TWICE A DAY. RINSE MOUTH AFTER USE.   [name]. EPINEPHrine 0.3 mg/0.3 mL IJ SOAJ injection USE AS DIRECTED. (Patient not taking: Reported on 11/15/2018)   . furosemide (LASIX) 40 MG tablet TAKE 1 TABLET BY MOUTH ONCE DAILY   . gabapentin (NEURONTIN) 300 MG capsule TAKE 1 CAPSULE BY MOUTH 3 TIMES DAILY ASNEEDED   . levothyroxine (SYNTHROID) 50 MCG tablet TAKE 1 TABLET BY MOUTH ONCE DAILY ON AN EMPTY STOMACH. WAIT 30 MINUTES BEFORE TAKING OTHER MEDS.   [name]. lisinopril (ZESTRIL) 30 MG tablet TAKE 1 TABLET BY MOUTH ONCE DAILY   . meloxicam (MOBIC) 15 MG tablet  TAKE 1 TABLET BY MOUTH ONCE A DAY WITH FOOD OR MILK IF NEEDE 10/11/2018: QAM  . methocarbamol (ROBAXIN) 750 MG tablet TAKE 1-2 TABLETS BY MOUTH 3 TIMES DAILY IF NEEDED FOR BACK PAIN.   . montelukast (SINGULAIR) 10 MG tablet Take 1 tablet (10 mg total) by mouth daily.   . nitroGLYCERIN (NITROSTAT) 0.4 MG SL tablet DISSOLVE 1 TABLET UNDER TONGUE AT ONSET OF CHEST PAIN. REPEAT IN 5 MIN IF NOT RESOLVED, MAX 3 DOSES. 911 IF NEEDED.   [name]. omeprazole (PRILOSEC) 20 MG capsule Take 1 capsule (20 mg total) by mouth daily.   [name]. PROAIR HFA 108 (90 Base) MCG/ACT inhaler inhale 2 puffs by mouth every 4 to 6 hours if needed for wheezing (Patient not taking: Reported on 11/15/2018)   . simvastatin (ZOCOR) 40 MG tablet TAKE 1 TABLET BY MOUTH ONCE DAILY   . tiotropium (SPIRIVA HANDIHALER) 18 MCG inhalation capsule Place 1 capsule (18 mcg total) into inhaler and inhale daily. (Patient not taking: Reported on 11/15/2018)   . traMADol (ULTRAM) 50 MG tablet TAKE 1 TABLET BY MOUTH EVERY 8 HOURS AS NEEDED PAIN 10/11/2018: QAM  . Vitamin D, Ergocalciferol, (DRISDOL) 1.25 MG (50000 UT) CAPS capsule Take 1 capsule (50,000 Units total) by mouth every 7 (seven) days.    No facility-administered encounter medications on file as of 12/23/2018.      Goals Addressed            This Visit's Progress   . I need more help in the home (pt-stated)       Current  Barriers:  [name] Knowledge Deficits related to managing multiple chronic diseases  . Lacks caregiver support.  . Film/video editor.  . Transportation barriers  Nurse Case Manager Clinical Goal(s):  [name] Over the next 90 days, patient will work with [facility]  to address needs related to Multiple chronic Disease Processes . Over the next 90 days, patient will work with [facility]  (community agency) to Increase Strength and regain some independence   Interventions:  . Discussed plans with patient for ongoing care management follow up and provided patient with direct  contact information for care management team  . Requested patient ask the DME company [employer] who is delivering w/c if they have received paperwork from [place] office. Patient verbalized understanding. If they haven't plan to print paperwork for her to take to upcoming appointments.  . Reviewed upcoming appointments: 9/10 Lateef(pain management) 9/14 Shnier (vein & Vasc) and 9/18 PCP.  [name] Patient called to let me know wheelchair was delivered, shower chair and shower wand was delivered and ramp will be built tomorrow.  . Patient also stating she has not smoked in over a month, continuing to be involved with the [place] quit line.   Patient Self Care Activities:   . Currently UNABLE TO independently perform ADLs or IADLs   Please see past updates related to this goal by clicking on the "Past Updates" button in the selected goal          The care management team will reach out to the patient again over the next 30 days.  The patient has been provided with contact information for the care management team and has been advised to call with any health related questions or concerns.    Merlene Morse Wynee Matarazzo RN, BSN Nurse Case Editor, [facility] Management  [phone]) Business Mobile

## 2018-12-27 DIAGNOSIS — J449 Chronic obstructive pulmonary disease, unspecified: Secondary | ICD-10-CM | POA: Diagnosis not present

## 2018-12-27 DIAGNOSIS — M545 Low back pain: Secondary | ICD-10-CM | POA: Diagnosis not present

## 2019-01-04 DIAGNOSIS — I890 Lymphedema, not elsewhere classified: Secondary | ICD-10-CM | POA: Diagnosis not present

## 2019-01-04 NOTE — Progress Notes (Deleted)
Patient's Name: Cheryl Chandler  MRN: [id number]  Referring Provider: [name],*  DOB: 1965/04/30  PCP: [name], PA-C  DOS: 01/05/2019  Note by: Gillis Santa, MD  Service setting: Ambulatory outpatient  Specialty: Interventional Pain Management  Location: [facility] (AMB) Pain Management Facility  Visit type: Initial Patient Evaluation  Patient type: New Patient   Primary Reason(s) for Visit: Encounter for initial evaluation of one or more chronic problems (new to examiner) potentially causing chronic pain, and posing a threat to normal musculoskeletal function. (Level of risk: High) CC: No chief complaint on file.  HPI  Cheryl Chandler is a 53 y.o. year old, female patient, who comes today to see [place] for the first time for an initial evaluation of her chronic pain. She has Abdominal pain; Anxiety; Arthritis; Asthma; COPD (chronic obstructive pulmonary disease) ([place]); Clinical depression; H/O eating disorder; Esophageal reflux; Acne inversa; Essential hypertension; HLD (hyperlipidemia); Adult hypothyroidism; Insomnia; Low back pain; Leg numbness; Morbid obesity ([place]); Posttraumatic stress disorder; Situational stress; Compulsive tobacco user syndrome; Vitamin D deficiency; Tobacco abuse; Lymphedema; Dyspnea on exertion; Apnea; Overactive bladder; Peripheral neuropathy; and Chronic venous insufficiency on their problem list. Today she comes in for evaluation of her No chief complaint on file.  Pain Assessment: Location:     Radiating:   Onset:   Duration:   Quality:   Severity:  /10 (subjective, self-reported pain score)  Note: Reported level is compatible with observation.                         When using our objective Pain Scale, levels between 6 and 10/10 are said to belong in an emergency room, as it progressively worsens from a 6/10, described as severely limiting, requiring emergency care not usually available at an outpatient pain management facility. At a 6/10  level, communication becomes difficult and requires great effort. Assistance to reach the emergency department may be required. Facial flushing and profuse sweating along with potentially dangerous increases in heart rate and blood pressure will be evident. Effect on ADL:   Timing:   Modifying factors:   BP:    HR:    Onset and Duration: Gradual Cause of pain: Unknown Severity: Getting worse, NAS-11 at its worse: 10/10, NAS-11 at its best: 8/10, NAS-11 now: 8/10 and NAS-11 on the average: 8/10 Timing: Not influenced by the time of the day and After activity or exercise Aggravating Factors: Prolonged standing and Walking Alleviating Factors: Lying down, Medications and Resting Associated Problems: Depression, Numbness, Sadness, Sweating, Tingling, Pain that wakes patient up and Pain that does not allow patient to sleep Quality of Pain: Burning and Stabbing Previous Examinations or Tests: CT scan, MRI scan and X-rays Previous Treatments: Narcotic medications and Steroid treatments by mouth  The patient comes into the clinics today for the first time for a chronic pain management evaluation. ***  Today I took the time to provide the patient with information regarding my pain practice. The patient was informed that my practice is divided into two sections: an interventional pain management section, as well as a completely separate and distinct medication management section. I explained that I have procedure days for my interventional therapies, and evaluation days for follow-ups and medication management. Because of the amount of documentation required during both, they are kept separated. This means that there is the possibility that she may be scheduled for a procedure on one day, and medication management the next. I have also informed her  that because of staffing and facility limitations, I no longer take patients for medication management only. To illustrate the reasons for this, I gave the  patient the example of surgeons, and how inappropriate it would be to refer a patient to his/her care, just to write for the post-surgical antibiotics on a surgery done by a different surgeon.   Because interventional pain management is my board-certified specialty, the patient was informed that joining my practice means that they are open to any and all interventional therapies. I made it clear that this does not mean that they will be forced to have any procedures done. What this means is that I believe interventional therapies to be essential part of the diagnosis and proper management of chronic pain conditions. Therefore, patients not interested in these interventional alternatives will be better served under the care of a different practitioner.  The patient was also made aware of my Comprehensive Pain Management Safety Guidelines where by joining my practice, they limit all of their nerve blocks and joint injections to those done by our practice, for as long as we are retained to manage their care.   Historic Controlled Substance Pharmacotherapy Review  PMP and historical list of controlled substances: ***  Highest opioid analgesic regimen found: ***  Most recent opioid analgesic: ***  Current opioid analgesics:  ***  Highest recorded MME/day: *** mg/day MME/day: *** mg/day  Medications: The patient did not bring the medication(s) to the appointment, as requested in our "New Patient Package" Pharmacodynamics: Desired effects: Analgesia: The patient reports >50% benefit. Reported improvement in function: The patient reports medication allows her to accomplish basic ADLs. Clinically meaningful improvement in function (CMIF): Sustained CMIF goals met Perceived effectiveness: Described as relatively effective, allowing for increase in activities of daily living (ADL) Undesirable effects: Side-effects or Adverse reactions: None reported Historical Monitoring: The patient  reports no history  of drug use. List of all UDS Test(s): No results found for: MDMA, COCAINSCRNUR, [place], [place], CANNABQUANT, [place], [place] List of other Serum/Urine Drug Screening Test(s):  No results found for: AMPHSCRSER, BARBSCRSER, BENZOSCRSER, COCAINSCRSER, COCAINSCRNUR, PCPSCRSER, PCPQUANT, THCSCRSER, THCU, CANNABQUANT, OPIATESCRSER, OXYSCRSER, PROPOXSCRSER, ETH Historical Background Evaluation: [place] PMP: PDMP not reviewed this encounter. Six (6) year initial data search conducted.             PMP NARX Score Report:  Narcotic: *** Sedative: *** Stimulant: *** [place] Department of public safety, offender search: Editor, commissioning Information) Non-contributory Risk Assessment Profile: Aberrant behavior: None observed or detected today Risk factors for fatal opioid overdose: None identified today PMP NARX Overdose Risk Score: *** Fatal overdose hazard ratio (HR): Calculation deferred Non-fatal overdose hazard ratio (HR): Calculation deferred Risk of opioid abuse or dependence: 0.7-3.0% with doses ? 36 MME/day and 6.1-26% with doses ? 120 MME/day. Substance use disorder (SUD) risk level: See below Personal History of Substance Abuse (SUD-Substance use disorder):  Alcohol:    Illegal Drugs:    Rx Drugs:    ORT Risk Level calculation:    ORT Scoring interpretation table:  Score <3 = Low Risk for SUD  Score between 4-7 = Moderate Risk for SUD  Score >8 = High Risk for Opioid Abuse   PHQ-2 Depression Scale:  Total score:    PHQ-2 Scoring interpretation table: (Score and probability of major depressive disorder)  Score 0 = No depression  Score 1 = 15.4% Probability  Score 2 = 21.1% Probability  Score 3 = 38.4% Probability  Score 4 = 45.5% Probability  Score 5 =  56.4% Probability  Score 6 = 78.6% Probability   PHQ-9 Depression Scale:  Total score:    PHQ-9 Scoring interpretation table:  Score 0-4 = No depression  Score 5-9 = Mild depression  Score 10-14 = Moderate depression  Score 15-19 = Moderately  severe depression  Score 20-27 = Severe depression (2.4 times higher risk of SUD and 2.89 times higher risk of overuse)   Pharmacologic Plan: As per protocol, I have not taken over any controlled substance management, pending the results of ordered tests and/or consults.            Initial impression: Pending review of available data and ordered tests.  Meds   Current Outpatient Medications:  .  albuterol (PROVENTIL) (2.5 MG/3ML) 0.083% nebulizer solution, INHALE CONTENTS OF 1 VIAL IN NEBULIZER EVERY 4 HOURS IF NEEDED FOR WHEEZING OR SHORTNESS OF BREATH., Disp: 75 mL, Rfl: 3 .  citalopram (CELEXA) 20 MG tablet, TAKE 1 TABLET BY MOUTH ONCE DAILY., Disp: 30 tablet, Rfl: 11 .  DULERA 100-5 MCG/ACT AERO, INHALE 2 PUFFS BY MOUTH TWICE A DAY. RINSE MOUTH AFTER USE., Disp: 13 g, Rfl: 3 .  EPINEPHrine 0.3 mg/0.3 mL IJ SOAJ injection, USE AS DIRECTED. (Patient not taking: Reported on 11/15/2018), Disp: 2 Device, Rfl: 3 .  furosemide (LASIX) 40 MG tablet, TAKE 1 TABLET BY MOUTH ONCE DAILY, Disp: 30 tablet, Rfl: 1 .  gabapentin (NEURONTIN) 300 MG capsule, TAKE 1 CAPSULE BY MOUTH 3 TIMES DAILY ASNEEDED, Disp: 90 capsule, Rfl: 1 .  levothyroxine (SYNTHROID) 50 MCG tablet, TAKE 1 TABLET BY MOUTH ONCE DAILY ON AN EMPTY STOMACH. WAIT 30 MINUTES BEFORE TAKING OTHER MEDS., Disp: 30 tablet, Rfl: 2 .  lisinopril (ZESTRIL) 30 MG tablet, TAKE 1 TABLET BY MOUTH ONCE DAILY, Disp: 30 tablet, Rfl: 1 .  meloxicam (MOBIC) 15 MG tablet, TAKE 1 TABLET BY MOUTH ONCE A DAY WITH FOOD OR MILK IF NEEDE, Disp: 30 tablet, Rfl: 3 .  methocarbamol (ROBAXIN) 750 MG tablet, TAKE 1-2 TABLETS BY MOUTH 3 TIMES DAILY IF NEEDED FOR BACK PAIN., Disp: 90 tablet, Rfl: 0 .  montelukast (SINGULAIR) 10 MG tablet, Take 1 tablet (10 mg total) by mouth daily., Disp: 30 tablet, Rfl: 2 .  nitroGLYCERIN (NITROSTAT) 0.4 MG SL tablet, DISSOLVE 1 TABLET UNDER TONGUE AT ONSET OF CHEST PAIN. REPEAT IN 5 MIN IF NOT RESOLVED, MAX 3 DOSES. 911 IF NEEDED., Disp:  25 tablet, Rfl: 2 .  omeprazole (PRILOSEC) 20 MG capsule, Take 1 capsule (20 mg total) by mouth daily., Disp: 30 capsule, Rfl: 12 .  PROAIR HFA 108 (90 Base) MCG/ACT inhaler, inhale 2 puffs by mouth every 4 to 6 hours if needed for wheezing (Patient not taking: Reported on 11/15/2018), Disp: 8.5 g, Rfl: 3 .  simvastatin (ZOCOR) 40 MG tablet, TAKE 1 TABLET BY MOUTH ONCE DAILY, Disp: 30 tablet, Rfl: 11 .  tiotropium (SPIRIVA HANDIHALER) 18 MCG inhalation capsule, Place 1 capsule (18 mcg total) into inhaler and inhale daily. (Patient not taking: Reported on 11/15/2018), Disp: 30 capsule, Rfl: 12 .  traMADol (ULTRAM) 50 MG tablet, TAKE 1 TABLET BY MOUTH EVERY 8 HOURS AS NEEDED PAIN, Disp: 30 tablet, Rfl: 3 .  Vitamin D, Ergocalciferol, (DRISDOL) 1.25 MG (50000 UT) CAPS capsule, Take 1 capsule (50,000 Units total) by mouth every 7 (seven) days., Disp: 12 capsule, Rfl: 1  Imaging Review  Cervical Imaging: Cervical MR wo contrast: No results found for this or any previous visit. Cervical MR wo contrast: No procedure found. Cervical  MR w/wo contrast: No results found for this or any previous visit. Cervical MR w contrast: No results found for this or any previous visit. Cervical CT wo contrast: No results found for this or any previous visit. Cervical CT w/wo contrast: No results found for this or any previous visit. Cervical CT w/wo contrast: No results found for this or any previous visit. Cervical CT w contrast: No results found for this or any previous visit. Cervical CT outside: No results found for this or any previous visit. Cervical DG 1 view: No results found for this or any previous visit. Cervical DG 2-3 views: No results found for this or any previous visit. Cervical DG F/E views: No results found for this or any previous visit. Cervical DG 2-3 clearing views: No results found for this or any previous visit. Cervical DG Bending/F/E views: No results found for this or any previous  visit. Cervical DG complete:  Results for orders placed during the hospital encounter of 10/01/15  DG Cervical Spine Complete   Narrative CLINICAL DATA:  Chronic neck pain, degenerative disc disease, pain in both shoulders, patient reports multiple falls  EXAM: CERVICAL SPINE - COMPLETE 4+ VIEW  COMPARISON:  Cervical spine series of May 18, 2006  FINDINGS: The lateral views are limited due to overlying soft tissues. Evaluation of C6 and C7 is limited. As best as can be determined the vertebral bodies are preserved in height. The intervertebral disc space heights are reasonably well-maintained. There is no perched facet or spinous process fracture. The odontoid is intact. The neural foramina exhibit no high-grade bony encroachment. The prevertebral soft tissue spaces exhibit normal thickness.  IMPRESSION: The study is technically limited. There is no compression fracture nor spondylolisthesis. No high-grade disc space narrowing or high-grade bony encroachment upon the neural foramina is observed.   Electronically Signed   By: David  Martinique M.D.   On: 10/01/2015 10:58    Cervical DG Myelogram views: No results found for this or any previous visit. Cervical DG Myelogram views: No results found for this or any previous visit. Cervical Discogram views: No results found for this or any previous visit.  Shoulder Imaging: Shoulder-R MR w contrast: No results found for this or any previous visit. Shoulder-L MR w contrast: No results found for this or any previous visit. Shoulder-R MR w/wo contrast: No results found for this or any previous visit. Shoulder-L MR w/wo contrast: No results found for this or any previous visit. Shoulder-R MR wo contrast: No results found for this or any previous visit. Shoulder-L MR wo contrast: No results found for this or any previous visit. Shoulder-R CT w contrast: No results found for this or any previous visit. Shoulder-L CT w contrast: No  results found for this or any previous visit. Shoulder-R CT w/wo contrast: No results found for this or any previous visit. Shoulder-L CT w/wo contrast: No results found for this or any previous visit. Shoulder-R CT wo contrast: No results found for this or any previous visit. Shoulder-L CT wo contrast: No results found for this or any previous visit. Shoulder-R DG Arthrogram: No results found for this or any previous visit. Shoulder-L DG Arthrogram: No results found for this or any previous visit. Shoulder-R DG 1 view: No results found for this or any previous visit. Shoulder-L DG 1 view: No results found for this or any previous visit. Shoulder-R DG: No results found for this or any previous visit. Shoulder-L DG: No results found for this or any previous visit.  Thoracic Imaging: Thoracic MR wo contrast: No results found for this or any previous visit. Thoracic MR wo contrast: No procedure found. Thoracic MR w/wo contrast: No results found for this or any previous visit. Thoracic MR w contrast: No results found for this or any previous visit. Thoracic CT wo contrast: No results found for this or any previous visit. Thoracic CT w/wo contrast: No results found for this or any previous visit. Thoracic CT w/wo contrast: No results found for this or any previous visit. Thoracic CT w contrast: No results found for this or any previous visit. Thoracic DG 2-3 views: No results found for this or any previous visit. Thoracic DG 4 views: No results found for this or any previous visit. Thoracic DG: No results found for this or any previous visit. Thoracic DG w/swimmers view: No results found for this or any previous visit. Thoracic DG Myelogram views: No results found for this or any previous visit. Thoracic DG Myelogram views: No results found for this or any previous visit.  Lumbosacral Imaging: Lumbar MR wo contrast: No results found for this or any previous visit. Lumbar MR wo contrast: No  procedure found. Lumbar MR w/wo contrast: No results found for this or any previous visit. Lumbar MR w/wo contrast: No results found for this or any previous visit. Lumbar MR w contrast: No results found for this or any previous visit. Lumbar CT wo contrast: No results found for this or any previous visit. Lumbar CT w/wo contrast: No results found for this or any previous visit. Lumbar CT w/wo contrast: No results found for this or any previous visit. Lumbar CT w contrast: No results found for this or any previous visit. Lumbar DG 1V: No results found for this or any previous visit. Lumbar DG 1V (Clearing): No results found for this or any previous visit. Lumbar DG 2-3V (Clearing): No results found for this or any previous visit. Lumbar DG 2-3 views: No results found for this or any previous visit. Lumbar DG (Complete) 4+V:  Results for orders placed during the hospital encounter of 09/15/18  DG Lumbar Spine Complete   Narrative CLINICAL DATA:  Pain  EXAM: LUMBAR SPINE - COMPLETE 4+ VIEW  COMPARISON:  October 01, 2015  FINDINGS: Frontal, lateral, spot lumbosacral lateral, and bilateral oblique views were obtained. There are 5 non-rib-bearing lumbar type vertebral bodies. There is no fracture or spondylolisthesis. There is moderate disc space narrowing at L5-S1. Other disc spaces appear unremarkable. There is no appreciable facet arthropathy.  IMPRESSION: Moderate disc space narrowing at L5-S1. Other disc spaces appear unremarkable. No appreciable facet arthropathy. No fracture or spondylolisthesis.   Electronically Signed   By: Lowella Grip III M.D.   On: 09/15/2018 13:40          Lumbar DG F/E views: No results found for this or any previous visit.       Lumbar DG Bending views: No results found for this or any previous visit.       Lumbar DG Myelogram views: No results found for this or any previous visit. Lumbar DG Myelogram: No results found for this or any previous  visit. Lumbar DG Myelogram: No results found for this or any previous visit. Lumbar DG Myelogram: No results found for this or any previous visit. Lumbar DG Myelogram Lumbosacral: No results found for this or any previous visit. Lumbar DG Diskogram views: No results found for this or any previous visit. Lumbar DG Diskogram views: No results found for this or  any previous visit. Lumbar DG Epidurogram OP: No results found for this or any previous visit. Lumbar DG Epidurogram IP: No results found for this or any previous visit.  Sacroiliac Joint Imaging: Sacroiliac Joint DG: No results found for this or any previous visit. Sacroiliac Joint MR w/wo contrast: No results found for this or any previous visit. Sacroiliac Joint MR wo contrast: No results found for this or any previous visit.  Spine Imaging: Whole Spine DG Myelogram views: No results found for this or any previous visit. Whole Spine MR Mets screen: No results found for this or any previous visit. Whole Spine MR Mets screen: No results found for this or any previous visit. Whole Spine MR w/wo: No results found for this or any previous visit. MRA Spinal Canal w/ cm: No results found for this or any previous visit. MRA Spinal Canal wo/ cm: No procedure found. MRA Spinal Canal w/wo cm: No results found for this or any previous visit. Spine Outside MR Films: No results found for this or any previous visit. Spine Outside CT Films: No results found for this or any previous visit. CT-Guided Biopsy: No results found for this or any previous visit. CT-Guided Needle Placement: No results found for this or any previous visit. DG Spine outside: No results found for this or any previous visit. IR Spine outside: No results found for this or any previous visit. NM Spine outside: No results found for this or any previous visit.  Hip Imaging: Hip-R MR w contrast: No results found for this or any previous visit. Hip-L MR w contrast: No results  found for this or any previous visit. Hip-R MR w/wo contrast: No results found for this or any previous visit. Hip-L MR w/wo contrast: No results found for this or any previous visit. Hip-R MR wo contrast: No results found for this or any previous visit. Hip-L MR wo contrast: No results found for this or any previous visit. Hip-R CT w contrast: No results found for this or any previous visit. Hip-L CT w contrast: No results found for this or any previous visit. Hip-R CT w/wo contrast: No results found for this or any previous visit. Hip-L CT w/wo contrast: No results found for this or any previous visit. Hip-R CT wo contrast: No results found for this or any previous visit. Hip-L CT wo contrast: No results found for this or any previous visit. Hip-R DG 2-3 views:  Results for orders placed during the hospital encounter of 09/15/18  DG Hip Unilat W or Wo Pelvis 2-3 Views Right   Narrative CLINICAL DATA:  Pain  EXAM: DG HIP (WITH OR WITHOUT PELVIS) 2-3V RIGHT  COMPARISON:  None.  FINDINGS: Frontal pelvis as well as frontal and lateral right hip images were obtained. There is no acute fracture or dislocation. There is mild symmetric narrowing of each hip joint. There is myositis ossificans lateral to the right proximal femoral head region. No erosive changes. There is evidence of an old healed fracture of the mid right femur, seen on the lateral view.  IMPRESSION: No acute fracture or dislocation. Old fracture with remodeling mid right femur. 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 Mild symmetric narrowing of each hip joint.   Electronically Signed   By: Lowella Grip III M.D.   On: 09/15/2018 13:38    Hip-L DG 2-3 views: No results found for this or any previous visit. Hip-R DG Arthrogram: No results found for this or any previous visit. Hip-L DG Arthrogram:  No results found for this or any previous visit. Hip-B DG Bilateral: No results found for this or  any previous visit.  Knee Imaging: Knee-R MR w contrast: No results found for this or any previous visit. Knee-L MR w contrast: No results found for this or any previous visit. Knee-R MR w/wo contrast: No results found for this or any previous visit. Knee-L MR w/wo contrast: No results found for this or any previous visit. Knee-R MR wo contrast: No results found for this or any previous visit. Knee-L MR wo contrast: No results found for this or any previous visit. Knee-R CT w contrast: No results found for this or any previous visit. Knee-L CT w contrast: No results found for this or any previous visit. Knee-R CT w/wo contrast: No results found for this or any previous visit. Knee-L CT w/wo contrast: No results found for this or any previous visit. Knee-R CT wo contrast: No results found for this or any previous visit. Knee-L CT wo contrast: No results found for this or any previous visit. Knee-R DG 1-2 views: No results found for this or any previous visit. Knee-L DG 1-2 views: No results found for this or any previous visit. Knee-R DG 3 views: No results found for this or any previous visit. Knee-L DG 3 views: No results found for this or any previous visit. Knee-R DG 4 views: No results found for this or any previous visit. Knee-L DG 4 views: No results found for this or any previous visit. Knee-R DG Arthrogram: No results found for this or any previous visit. Knee-L DG Arthrogram: No results found for this or any previous visit.  Ankle Imaging: Ankle-R DG Complete: No results found for this or any previous visit. Ankle-L DG Complete: No results found for this or any previous visit.  Foot Imaging: Foot-R DG Complete: No results found for this or any previous visit. Foot-L DG Complete: No results found for this or any previous visit.  Elbow Imaging: Elbow-R DG Complete: No results found for this or any previous visit. Elbow-L DG Complete: No results found for this or any previous  visit.  Wrist Imaging: Wrist-R DG Complete: No results found for this or any previous visit. Wrist-L DG Complete: No results found for this or any previous visit.  Hand Imaging: Hand-R DG Complete: No results found for this or any previous visit. Hand-L DG Complete: No results found for this or any previous visit.  Complexity Note: Imaging results reviewed. Results shared with Cheryl Chandler, using Layman's terms.                         ROS  Cardiovascular: High blood pressure Pulmonary or Respiratory: Wheezing and difficulty taking a deep full breath (Asthma), Difficulty blowing air out (Emphysema), Snoring  and Coughing up mucus (Bronchitis) Neurological: Abnormal skin sensations (Peripheral Neuropathy) Review of Past Neurological Studies: No results found for this or any previous visit. 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  Laboratory Chemistry Profile   Screening Lab Results  Component Value Date  PREGTESTUR NEGATIVE 09/08/2012    Inflammation (CRP: Acute Phase) (ESR: Chronic Phase) No results found for: CRP, ESRSEDRATE, LATICACIDVEN                       Rheumatology No results found for: RF, ANA, LABURIC, URICUR, LYMEIGGIGMAB, LYMEABIGMQN, HLAB27                      Renal Lab Results  Component Value Date   BUN 16 10/03/2018   CREATININE 0.81  10/03/2018   BCR 20 10/03/2018   GFRAA 97 10/03/2018   GFRNONAA 84 10/03/2018                             Hepatic Lab Results  Component Value Date   AST 15 10/03/2018   ALT 23 10/03/2018   ALBUMIN 3.8 10/03/2018   ALKPHOS 76 10/03/2018   LIPASE 45 08/05/2014                        Electrolytes Lab Results  Component Value Date   NA 142 10/03/2018   K 4.4 10/03/2018   CL 103 10/03/2018   CALCIUM 8.5 (L) 10/03/2018                        Neuropathy Lab Results  Component Value Date   HGBA1C 5.8 (H) 10/03/2018                        CNS No results found for: COLORCSF, APPEARCSF, RBCCOUNTCSF, WBCCSF, POLYSCSF, LYMPHSCSF, EOSCSF, PROTEINCSF, GLUCCSF, JCVIRUS, CSFOLI, IGGCSF, LABACHR, ACETBL                      Bone Lab Results  Component Value Date   VD25OH 6.2 (L) 10/03/2018                         Coagulation Lab Results  Component Value Date   PLT 365 10/03/2018                        Cardiovascular Lab Results  Component Value Date   BNP 7.4 11/30/2014   TROPONINI <0.03 07/10/2017   HGB 12.8 10/03/2018   HCT 38.8 10/03/2018                         ID Lab Results  Component Value Date   PREGTESTUR NEGATIVE 09/08/2012    Cancer No results found for: CEA, CA125, LABCA2                      Endocrine Lab Results  Component Value Date   TSH 2.190 10/03/2018                        Note: Lab results reviewed.  [place]  Drug: Cheryl Chandler  reports no history of drug use. Alcohol:  reports no history of alcohol use. Tobacco:  reports that she has been smoking cigarettes. She has a 7.50 pack-year smoking history. She has never used smokeless tobacco. Medical:  has a past medical history of Arthritis, Asthma, COPD (chronic obstructive pulmonary disease) ([place]), Hypertension, Pericarditis, Personal history of urinary calculi (11/29/2014), and Thyroid disease. Family: family history includes Seizures in  her sister.  Past Surgical History:  Procedure  Laterality Date  . APPENDECTOMY    . arm surgery Left    pins  . fatty tumor removed from back    . JOINT REPLACEMENT Right   . LEG SURGERY Bilateral   . OVARIAN CYST SURGERY Right    took piece of right ovary due to cyst  . TOTAL HIP ARTHROPLASTY Right   . wisdom teeth removed     Active Ambulatory Problems    Diagnosis Date Noted  . Abdominal pain 11/29/2014  . Anxiety 11/29/2014  . Arthritis 11/29/2014  . Asthma 11/29/2014  . COPD (chronic obstructive pulmonary disease) ([place]) 11/29/2014  . Clinical depression 11/29/2014  . H/O eating disorder 11/29/2014  . Esophageal reflux 11/29/2014  . Acne inversa 11/29/2014  . Essential hypertension 11/29/2014  . HLD (hyperlipidemia) 11/29/2014  . Adult hypothyroidism 11/29/2014  . Insomnia 11/29/2014  . Low back pain 11/29/2014  . Leg numbness 11/29/2014  . Morbid obesity ([place]) 11/29/2014  . Posttraumatic stress disorder 11/29/2014  . Situational stress 11/29/2014  . Compulsive tobacco user syndrome 11/29/2014  . Vitamin D deficiency 11/29/2014  . Tobacco abuse 11/29/2014  . Lymphedema 11/30/2014  . Dyspnea on exertion 11/30/2014  . Apnea 01/02/2016  . Overactive bladder 09/30/2018  . Peripheral neuropathy 09/30/2018  . Chronic venous insufficiency 11/09/2018   Resolved Ambulatory Problems    Diagnosis Date Noted  . Problems influencing health status 11/29/2014  . H/O headache 11/29/2014  . Personal history of urinary calculi 11/29/2014   Past Medical History:  Diagnosis Date  . Hypertension   . Pericarditis   . Thyroid disease    Constitutional Exam  General appearance: Well nourished, well developed, and well hydrated. In no apparent acute distress There were no vitals filed for this visit. BMI Assessment: Estimated body mass index is 51.37 kg/m as calculated from the following:   Height as of 11/07/18: _0  (1.6 m).   Weight as of 11/07/18: 290 lb (131.5 kg).  BMI interpretation table: BMI level Category Range  association with higher incidence of chronic pain  <18 kg/m2 Underweight   18.5-24.9 kg/m2 Ideal body weight   25-29.9 kg/m2 Overweight Increased incidence by 20%  30-34.9 kg/m2 Obese (Class I) Increased incidence by 68%  35-39.9 kg/m2 Severe obesity (Class II) Increased incidence by 136%  >40 kg/m2 Extreme obesity (Class III) Increased incidence by 254%   Patient's current BMI Ideal Body weight  There is no height or weight on file to calculate BMI. Patient weight not recorded   BMI Readings from Last 4 Encounters:  11/07/18 51.37 kg/m  11/02/18 51.49 kg/m  09/15/18 51.49 kg/m  08/16/18 51.49 kg/m   Wt Readings from Last 4 Encounters:  11/07/18 290 lb (131.5 kg)  09/15/18 300 lb (136.1 kg)  08/16/18 300 lb (136.1 kg)  08/07/18 300 lb (136.1 kg)  Psych/Mental status: Alert, oriented x 3 (person, place, & time)       Eyes: PERLA Respiratory: No evidence of acute respiratory distress  Cervical Spine Area Exam  Skin & Axial Inspection: No masses, redness, edema, swelling, or associated skin lesions Alignment: Symmetrical Functional ROM: Unrestricted ROM      Stability: No instability detected Muscle Tone/Strength: Functionally intact. No obvious neuro-muscular anomalies detected. Sensory (Neurological): Unimpaired Palpation: No palpable anomalies              Upper Extremity (UE) Exam    Side: Right upper extremity  Side: Left upper extremity  Skin & Extremity  Inspection: Skin color, temperature, and hair growth are WNL. No peripheral edema or cyanosis. No masses, redness, swelling, asymmetry, or associated skin lesions. No contractures.  Skin & Extremity Inspection: Skin color, temperature, and hair growth are WNL. No peripheral edema or cyanosis. No masses, redness, swelling, asymmetry, or associated skin lesions. No contractures.  Functional ROM: Unrestricted ROM          Functional ROM: Unrestricted ROM          Muscle Tone/Strength: Functionally intact. No obvious  neuro-muscular anomalies detected.  Muscle Tone/Strength: Functionally intact. No obvious neuro-muscular anomalies detected.  Sensory (Neurological): Unimpaired          Sensory (Neurological): Unimpaired          Palpation: No palpable anomalies              Palpation: No palpable anomalies              Provocative Test(s):  Phalen's test: deferred Tinel's test: deferred Apley's scratch test (touch opposite shoulder):  Action 1 (Across chest): deferred Action 2 (Overhead): deferred Action 3 (LB reach): deferred   Provocative Test(s):  Phalen's test: deferred Tinel's test: deferred Apley's scratch test (touch opposite shoulder):  Action 1 (Across chest): deferred Action 2 (Overhead): deferred Action 3 (LB reach): deferred    Thoracic Spine Area Exam  Skin & Axial Inspection: No masses, redness, or swelling Alignment: Symmetrical Functional ROM: Unrestricted ROM Stability: No instability detected Muscle Tone/Strength: Functionally intact. No obvious neuro-muscular anomalies detected. Sensory (Neurological): Unimpaired Muscle strength & Tone: No palpable anomalies  Lumbar Spine Area Exam  Skin & Axial Inspection: No masses, redness, or swelling Alignment: Symmetrical Functional ROM: Unrestricted ROM       Stability: No instability detected Muscle Tone/Strength: Functionally intact. No obvious neuro-muscular anomalies detected. Sensory (Neurological): Unimpaired Palpation: No palpable anomalies       Provocative Tests: Hyperextension/rotation test: deferred today       Lumbar quadrant test (Kemp's test): deferred today       Lateral bending test: deferred today       Patrick's Maneuver: deferred today                   FABER* test: deferred today                   S-I anterior distraction/compression test: deferred today         S-I lateral compression test: deferred today         S-I Thigh-thrust test: deferred today         S-I Gaenslen's test: deferred today          *(Flexion, ABduction and External Rotation)  Gait & Posture Assessment  Ambulation: Unassisted Gait: Relatively normal for age and body habitus Posture: WNL   Lower Extremity Exam    Side: Right lower extremity  Side: Left lower extremity  Stability: No instability observed          Stability: No instability observed          Skin & Extremity Inspection: Skin color, temperature, and hair growth are WNL. No peripheral edema or cyanosis. No masses, redness, swelling, asymmetry, or associated skin lesions. No contractures.  Skin & Extremity Inspection: Skin color, temperature, and hair growth are WNL. No peripheral edema or cyanosis. No masses, redness, swelling, asymmetry, or associated skin lesions. No contractures.  Functional ROM: Unrestricted ROM  Functional ROM: Unrestricted ROM                  Muscle Tone/Strength: Functionally intact. No obvious neuro-muscular anomalies detected.  Muscle Tone/Strength: Functionally intact. No obvious neuro-muscular anomalies detected.  Sensory (Neurological): Unimpaired        Sensory (Neurological): Unimpaired        DTR: Patellar: deferred today Achilles: deferred today Plantar: deferred today  DTR: Patellar: deferred today Achilles: deferred today Plantar: deferred today  Palpation: No palpable anomalies  Palpation: No palpable anomalies   Assessment  Primary Diagnosis & Pertinent Problem List: There were no encounter diagnoses.  Visit Diagnosis (New problems to examiner): No diagnosis found. Plan of Care (Initial workup plan)  Note: Cheryl Chandler was reminded that as per protocol, today's visit has been an evaluation only. We have not taken over the patient's controlled substance management.  Problem-specific plan: No problem-specific Assessment & Plan notes found for this encounter.  Lab Orders  No laboratory test(s) ordered today   Imaging Orders  No imaging studies ordered today   Referral Orders  No  referral(s) requested today   Procedure Orders    No procedure(s) ordered today   Pharmacotherapy (current): Medications ordered:  No orders of the defined types were placed in this encounter.  Medications administered during this visit: Cheryl Chandler had no medications administered during this visit.   Pharmacological management options:  Opioid Analgesics: The patient was informed that there is no guarantee that she would be a candidate for opioid analgesics. The decision will be made following CDC guidelines. This decision will be based on the results of diagnostic studies, as well as Cheryl Chandler's risk profile.   Membrane stabilizer: To be determined at a later time  Muscle relaxant: To be determined at a later time  NSAID: To be determined at a later time  Other analgesic(s): To be determined at a later time   Interventional management options: Cheryl Chandler was informed that there is no guarantee that she would be a candidate for interventional therapies. The decision will be based on the results of diagnostic studies, as well as Cheryl Chandler's risk profile.  Procedure(s) under consideration:  ***   Provider-requested follow-up: No follow-ups on file.  Future Appointments  Date Time Provider [place]  01/05/2019 11:00 AM Gillis Santa, MD [facility] None  01/09/2019  2:00 PM [facility] VASC 1 [facility] None  01/09/2019  3:00 PM Schnier, Dolores Lory, MD [facility] None  01/10/2019  2:00 PM CFP CCM PHARMACY [facility]  01/13/2019 10:30 AM [name], PA-C [facility]  01/18/2019  1:00 PM CFP CCM CASE MANAGER [facility]  01/23/2019  1:30 PM CFP CCM SOCIAL WORK [facility]  02/06/2019  9:20 AM Gollan, Kathlene November, MD [facility]    Primary Care Physician: [name], PA-C Location: [facility] Note by: Gillis Santa, MD Date: 01/05/2019; Time: 2:27 PM  Note: This dictation was prepared with Dragon dictation. Any  transcriptional errors that may result from this process are unintentional.

## 2019-01-05 ENCOUNTER — Ambulatory Visit: Payer: Medicaid Other | Admitting: Student in an Organized Health Care Education/Training Program

## 2019-01-09 ENCOUNTER — Encounter (INDEPENDENT_AMBULATORY_CARE_PROVIDER_SITE_OTHER): Payer: Self-pay | Admitting: Vascular Surgery

## 2019-01-09 ENCOUNTER — Ambulatory Visit (INDEPENDENT_AMBULATORY_CARE_PROVIDER_SITE_OTHER): Payer: Medicaid Other | Admitting: Vascular Surgery

## 2019-01-09 ENCOUNTER — Ambulatory Visit (INDEPENDENT_AMBULATORY_CARE_PROVIDER_SITE_OTHER): Payer: Medicaid Other

## 2019-01-09 ENCOUNTER — Other Ambulatory Visit: Payer: Self-pay

## 2019-01-09 VITALS — BP 147/66 | HR 75 | Resp 14 | Ht 64.0 in

## 2019-01-09 DIAGNOSIS — E785 Hyperlipidemia, unspecified: Secondary | ICD-10-CM | POA: Diagnosis not present

## 2019-01-09 DIAGNOSIS — J449 Chronic obstructive pulmonary disease, unspecified: Secondary | ICD-10-CM | POA: Diagnosis not present

## 2019-01-09 DIAGNOSIS — I10 Essential (primary) hypertension: Secondary | ICD-10-CM

## 2019-01-09 DIAGNOSIS — I890 Lymphedema, not elsewhere classified: Secondary | ICD-10-CM

## 2019-01-09 DIAGNOSIS — I872 Venous insufficiency (chronic) (peripheral): Secondary | ICD-10-CM

## 2019-01-09 NOTE — Progress Notes (Signed)
MRN : [id number]  Cheryl Chandler is a 53 y.o. (09-16-1965) female who presents with chief complaint of  Chief Complaint  Patient presents with   Follow-up  .  History of Present Illness:   The patient returns to the office for followup evaluation regarding leg swelling.  The swelling has persisted but with the lymph pump is much, much better controlled. The pain associated with swelling is essentially eliminated. There have not been any interval development of a ulcerations or wounds.  The patient denies problems with the pump, noting it is working well and the leggings are in good condition.  Since the previous visit the patient has been wearing graduated compression stockings and using the lymph pump on a routine basis and  has noted significant improvement in the lymphedema.   Duplex ultrasound shows deep reflux bilaterally  Current Meds  Medication Sig   albuterol (PROVENTIL) (2.5 MG/3ML) 0.083% nebulizer solution INHALE CONTENTS OF 1 VIAL IN NEBULIZER EVERY 4 HOURS IF NEEDED FOR WHEEZING OR SHORTNESS OF BREATH.   citalopram (CELEXA) 20 MG tablet TAKE 1 TABLET BY MOUTH ONCE DAILY.   DULERA 100-5 MCG/ACT AERO INHALE 2 PUFFS BY MOUTH TWICE A DAY. RINSE MOUTH AFTER USE.   EPINEPHrine 0.3 mg/0.3 mL IJ SOAJ injection USE AS DIRECTED.   furosemide (LASIX) 40 MG tablet TAKE 1 TABLET BY MOUTH ONCE DAILY   gabapentin (NEURONTIN) 300 MG capsule TAKE 1 CAPSULE BY MOUTH 3 TIMES DAILY ASNEEDED   levothyroxine (SYNTHROID) 50 MCG tablet TAKE 1 TABLET BY MOUTH ONCE DAILY ON AN EMPTY STOMACH. WAIT 30 MINUTES BEFORE TAKING OTHER MEDS.   lisinopril (ZESTRIL) 30 MG tablet TAKE 1 TABLET BY MOUTH ONCE DAILY   meloxicam (MOBIC) 15 MG tablet TAKE 1 TABLET BY MOUTH ONCE A DAY WITH FOOD OR MILK IF NEEDE   methocarbamol (ROBAXIN) 750 MG tablet TAKE 1-2 TABLETS BY MOUTH 3 TIMES DAILY IF NEEDED FOR BACK PAIN.   montelukast (SINGULAIR) 10 MG tablet Take 1 tablet (10 mg total) by mouth  daily.   nitroGLYCERIN (NITROSTAT) 0.4 MG SL tablet DISSOLVE 1 TABLET UNDER TONGUE AT ONSET OF CHEST PAIN. REPEAT IN 5 MIN IF NOT RESOLVED, MAX 3 DOSES. 911 IF NEEDED.   omeprazole (PRILOSEC) 20 MG capsule Take 1 capsule (20 mg total) by mouth daily.   PROAIR HFA 108 (90 Base) MCG/ACT inhaler inhale 2 puffs by mouth every 4 to 6 hours if needed for wheezing   simvastatin (ZOCOR) 40 MG tablet TAKE 1 TABLET BY MOUTH ONCE DAILY   tiotropium (SPIRIVA HANDIHALER) 18 MCG inhalation capsule Place 1 capsule (18 mcg total) into inhaler and inhale daily.   traMADol (ULTRAM) 50 MG tablet TAKE 1 TABLET BY MOUTH EVERY 8 HOURS AS NEEDED PAIN   Vitamin D, Ergocalciferol, (DRISDOL) 1.25 MG (50000 UT) CAPS capsule Take 1 capsule (50,000 Units total) by mouth every 7 (seven) days.    Past Medical History:  Diagnosis Date   Arthritis    Asthma    COPD (chronic obstructive pulmonary disease) ([place])    Hypertension    Pericarditis    Personal history of urinary calculi 11/29/2014   Thyroid disease     Past Surgical History:  Procedure Laterality Date   APPENDECTOMY     arm surgery Left    pins   fatty tumor removed from back     JOINT REPLACEMENT Right    LEG SURGERY Bilateral    OVARIAN CYST SURGERY Right    took piece of  right ovary due to cyst   TOTAL HIP ARTHROPLASTY Right    wisdom teeth removed      Social History Social History   Tobacco Use   Smoking status: Former Smoker    Packs/day: 0.25    Years: 30.00    Pack years: 7.50    Types: Cigarettes   Smokeless tobacco: Never Used  Substance Use Topics   Alcohol use: No    Alcohol/week: 0.0 standard drinks   Drug use: No    Family History Family History  Problem Relation Age of Onset   Seizures Sister        disorders    Allergies  Allergen Reactions   Codeine Shortness Of Breath and Rash   Percocet [Oxycodone-Acetaminophen] Shortness Of Breath   Sulfa Antibiotics Shortness Of Breath and Rash     Aspirin Hives   Bee Venom     Bee stings   Darvon [Propoxyphene]     Darvocet-N 100   Latex    Oxycodone Nausea And Vomiting   Penicillins     Has patient had a PCN reaction causing immediate rash, facial/tongue/throat swelling, SOB or lightheadedness with hypotension: Unknown Has patient had a PCN reaction causing severe rash involving mucus membranes or skin necrosis: Unknown Has patient had a PCN reaction that required hospitalization: Unknown Has patient had a PCN reaction occurring within the last 10 years: Unknown If all of the above answers are "NO", then may proceed with Cephalosporin use.     REVIEW OF SYSTEMS (Negative unless checked)  Constitutional: [] Weight loss  [] Fever  [] Chills Cardiac: [] Chest pain   [] Chest pressure   [] Palpitations   [] Shortness of breath when laying flat   [] Shortness of breath with exertion. Vascular:  [] Pain in legs with walking   [x] Pain in legs at rest  [] History of DVT   [] Phlebitis   [x] Swelling in legs   [] Varicose veins   [] Non-healing ulcers Pulmonary:   [] Uses home oxygen   [] Productive cough   [] Hemoptysis   [] Wheeze  [] COPD   [] Asthma Neurologic:  [] Dizziness   [] Seizures   [] History of stroke   [] History of TIA  [] Aphasia   [] Vissual changes   [] Weakness or numbness in arm   [x] Weakness or numbness in leg Musculoskeletal:   [] Joint swelling   [] Joint pain   [x] Low back pain Hematologic:  [] Easy bruising  [] Easy bleeding   [] Hypercoagulable state   [] Anemic Gastrointestinal:  [] Diarrhea   [] Vomiting  [x] Gastroesophageal reflux/heartburn   [] Difficulty swallowing. Genitourinary:  [] Chronic kidney disease   [] Difficult urination  [] Frequent urination   [] Blood in urine Skin:  [] Rashes   [] Ulcers  Psychological:  [] History of anxiety   []  History of major depression.  Physical Examination  Vitals:   01/09/19 1537  BP: (!) 147/66  Pulse: 75  Resp: 14  Height: 5\' 4"  (1.626 m)   Body mass index is 49.78 kg/m. Gen: WD/WN, NAD  seen in a wheelchair Head: [place]/AT, No temporalis wasting.  Ear/Nose/Throat: Hearing grossly intact, nares w/o erythema or drainage Eyes: PER, EOMI, sclera nonicteric.  Neck: Supple, no large masses.   Pulmonary:  Good air movement, no audible wheezing bilaterally, no use of accessory muscles.  Cardiac: RRR, no JVD Vascular: scattered varicosities present bilaterally.  Mild venous stasis changes to the legs bilaterally.  3-4+ soft pitting edema Vessel Right Left  PT Palpable Palpable  DP Palpable Palpable  Gastrointestinal: Non-distended. No guarding/no peritoneal signs.  Musculoskeletal: M/S 5/5 throughout.  No deformity or atrophy.  Neurologic: CN 2-12 intact. Symmetrical.  Speech is fluent. Motor exam as listed above. Psychiatric: Judgment intact, Mood & affect appropriate for pt's clinical situation. Dermatologic: moderate venous rashes no ulcers noted.  No changes consistent with cellulitis. Lymph : No lichenification or skin changes of chronic lymphedema.  CBC Lab Results  Component Value Date   WBC 9.1 10/03/2018   HGB 12.8 10/03/2018   HCT 38.8 10/03/2018   MCV 90 10/03/2018   PLT 365 10/03/2018    BMET    Component Value Date/Time   NA 142 10/03/2018 1400   NA 135 08/05/2014 1306   K 4.4 10/03/2018 1400   K 4.5 08/05/2014 1306   CL 103 10/03/2018 1400   CL 102 08/05/2014 1306   CO2 22 10/03/2018 1400   CO2 25 08/05/2014 1306   GLUCOSE 97 10/03/2018 1400   GLUCOSE 110 (H) 09/15/2018 1215   GLUCOSE 136 (H) 08/05/2014 1306   BUN 16 10/03/2018 1400   BUN 23 (H) 08/05/2014 1306   CREATININE 0.81 10/03/2018 1400   CREATININE 0.86 08/05/2014 1306   CALCIUM 8.5 (L) 10/03/2018 1400   CALCIUM 8.2 (L) 08/05/2014 1306   GFRNONAA 84 10/03/2018 1400   GFRNONAA >60 08/05/2014 1306   GFRAA 97 10/03/2018 1400   GFRAA >60 08/05/2014 1306   CrCl cannot be calculated (Patient's most recent lab result is older than the maximum 21 days allowed.).  COAG No results found for:  INR, PROTIME  Radiology No results found.   Assessment/Plan 1. Chronic venous insufficiency  No surgery or intervention at this point in time.    I have reviewed my discussion with the patient regarding lymphedema and why it  causes symptoms.  Patient will continue wearing graduated compression stockings class 1 (20-30 mmHg) on a daily basis a prescription was given. The patient is reminded to put the stockings on first thing in the morning and removing them in the evening. The patient is instructed specifically not to sleep in the stockings.   In addition, behavioral modification throughout the day will be continued.  This will include frequent elevation (such as in a recliner), use of over the counter pain medications as needed and exercise such as walking.  I have reviewed systemic causes for chronic edema such as liver, kidney and cardiac etiologies and there does not appear to be any significant changes in these organ systems over the past year.  The patient is under the impression that these organ systems are all stable and unchanged.    The patient will continue aggressive use of the  lymph pump.  This will continue to improve the edema control and prevent sequela such as ulcers and infections.   The patient will follow-up with me on an annual basis.    2. Essential hypertension Continue antihypertensive medications as already ordered, these medications have been reviewed and there are no changes at this time.   3. Chronic obstructive pulmonary disease, unspecified COPD type (HCC) Continue pulmonary medications and aerosols as already ordered, these medications have been reviewed and there are no changes at this time.    4. Hyperlipidemia, unspecified hyperlipidemia type Continue statin as ordered and reviewed, no changes at this time    Levora Dredge, MD  01/09/2019 4:43 PM

## 2019-01-10 ENCOUNTER — Telehealth: Payer: Self-pay

## 2019-01-10 ENCOUNTER — Ambulatory Visit: Payer: Self-pay | Admitting: *Deleted

## 2019-01-10 DIAGNOSIS — R600 Localized edema: Secondary | ICD-10-CM

## 2019-01-10 NOTE — Patient Instructions (Signed)
Thank you allowing the Chronic Care Management Team to be a part of your care! It was a pleasure speaking with you today!   CCM (Chronic Care Management) Team   Sonia Bromell RN, BSN Nurse Care Coordinator  [phone]  Catie [facility] PharmD  Clinical Pharmacist  [phone]  Eula Fried LCSW Clinical Social Worker [phone]  Goals Addressed            This Visit's Progress   . RN- I need more help in the home (pt-stated)       Current Barriers:  [name] Knowledge Deficits related to managing multiple chronic diseases  . Lacks caregiver support.  . Film/video editor.  . Transportation barriers  Nurse Case Manager Clinical Goal(s):  [name] Over the next 90 days, patient will work with [facility]  to address needs related to Multiple chronic Disease Processes . Over the next 90 days, patient will work with [facility]  (community agency) to Increase Strength and regain some independence   Interventions:  . Discussed plans with patient for ongoing care management follow up and provided patient with direct contact information for care management team  . Patient reports she has received Lymph edema pump from DME supply store.  . Patient reports she also was given TED hose at recent Vein MD appt. Patient stated Vein MD told her she needed to use Lymph edema pump and keep legs elevated daily.  . Patient stated she had not heard from [facility] provider.  . Call placed to Harmon Pier at [facility] [phone] to clarify who patient's PCS provider would be, she stated last charted noted stated [facility]. Harmon Pier made me aware how to increase days if patient needed further assistance.  . Patient maintains she continues to be tobacco free, Quit date of November 14 2018.  [name] Patient reports she had to reschedule her pain MD appt.  . While on the phone Clarified with Catie Pharm-D correct directions for Nebulizer use. . Patient continues to grieve the loss of her mom and is  struggling with her dad thinking about dating again.  . Patient states she has a Marine scientist from [employer] who calls her [name] [phone].  [name] Secure email sent to [facility] to inquire about PCS service start date.   Patient Self Care Activities:   . Currently UNABLE TO independently perform ADLs or IADLs   Please see past updates related to this goal by clicking on the "Past Updates" button in the selected goal         The patient verbalized understanding of instructions provided today and declined a print copy of patient instruction materials.   The patient has been provided with contact information for the care management team and has been advised to call with any health related questions or concerns.

## 2019-01-10 NOTE — Chronic Care Management (AMB) (Signed)
Chronic Care Management   Follow Up Note   01/10/2019 Name: Cheryl Chandler MRN: [id number] DOB: 12/29/1965  Referred by: [name], PA-C Reason for referral : Chronic Care Management (Lymphedema) and Care Coordination (Home Care )   Cheryl Chandler is a 53 y.o. year old female who is a primary care patient of [name], PA-C. The CCM team was consulted for assistance with chronic disease management and care coordination needs.    Review of patient status, including review of consultants reports, relevant laboratory and other test results, and collaboration with appropriate care team members and the patient's provider was performed as part of comprehensive patient evaluation and provision of chronic care management services.    SDOH (Social Determinants of Health) screening performed today: Tobacco Use Stress. See Care Plan for related entries.   Advanced Directives Status: N See Care Plan and Vynca application for related entries.  Outpatient Encounter Medications as of 01/10/2019  Medication Sig Note  . albuterol (PROVENTIL) (2.5 MG/3ML) 0.083% nebulizer solution INHALE CONTENTS OF 1 VIAL IN NEBULIZER EVERY 4 HOURS IF NEEDED FOR WHEEZING OR SHORTNESS OF BREATH. 10/11/2018: Four times daily   . citalopram (CELEXA) 20 MG tablet TAKE 1 TABLET BY MOUTH ONCE DAILY.   . DULERA 100-5 MCG/ACT AERO INHALE 2 PUFFS BY MOUTH TWICE A DAY. RINSE MOUTH AFTER USE.   [name] EPINEPHrine 0.3 mg/0.3 mL IJ SOAJ injection USE AS DIRECTED.   . furosemide (LASIX) 40 MG tablet TAKE 1 TABLET BY MOUTH ONCE DAILY   . gabapentin (NEURONTIN) 300 MG capsule TAKE 1 CAPSULE BY MOUTH 3 TIMES DAILY ASNEEDED   . levothyroxine (SYNTHROID) 50 MCG tablet TAKE 1 TABLET BY MOUTH ONCE DAILY ON AN EMPTY STOMACH. WAIT 30 MINUTES BEFORE TAKING OTHER MEDS.   [name] lisinopril (ZESTRIL) 30 MG tablet TAKE 1 TABLET BY MOUTH ONCE DAILY   . meloxicam (MOBIC) 15 MG tablet TAKE 1 TABLET BY MOUTH ONCE A DAY WITH  FOOD OR MILK IF NEEDE 10/11/2018: QAM  . methocarbamol (ROBAXIN) 750 MG tablet TAKE 1-2 TABLETS BY MOUTH 3 TIMES DAILY IF NEEDED FOR BACK PAIN.   . montelukast (SINGULAIR) 10 MG tablet Take 1 tablet (10 mg total) by mouth daily.   . nitroGLYCERIN (NITROSTAT) 0.4 MG SL tablet DISSOLVE 1 TABLET UNDER TONGUE AT ONSET OF CHEST PAIN. REPEAT IN 5 MIN IF NOT RESOLVED, MAX 3 DOSES. 911 IF NEEDED.   [name] omeprazole (PRILOSEC) 20 MG capsule Take 1 capsule (20 mg total) by mouth daily.   [name] PROAIR HFA 108 (90 Base) MCG/ACT inhaler inhale 2 puffs by mouth every 4 to 6 hours if needed for wheezing   . simvastatin (ZOCOR) 40 MG tablet TAKE 1 TABLET BY MOUTH ONCE DAILY   . tiotropium (SPIRIVA HANDIHALER) 18 MCG inhalation capsule Place 1 capsule (18 mcg total) into inhaler and inhale daily.   . traMADol (ULTRAM) 50 MG tablet TAKE 1 TABLET BY MOUTH EVERY 8 HOURS AS NEEDED PAIN 10/11/2018: QAM  . Vitamin D, Ergocalciferol, (DRISDOL) 1.25 MG (50000 UT) CAPS capsule Take 1 capsule (50,000 Units total) by mouth every 7 (seven) days.    No facility-administered encounter medications on file as of 01/10/2019.      Goals Addressed            This Visit's Progress   . RN- I need more help in the home (pt-stated)       Current Barriers:  [name] Knowledge Deficits related to managing multiple chronic diseases  .  Lacks caregiver support.  . Corporate treasurerinancial Constraints.  . Transportation barriers  Nurse Case Manager Clinical Goal(s):  [name]. [facility] the next 90 days[facility]  [facility] needs related to Multiple chronic Disease Processes . Over the next 90 days, patient will work with [facility]  (community agency) to Increase Strength and regain some independence   Interventions:  . Discussed plans with patient for ongoing care management follow up and provided patient with direct contact information for care management team  . Patient reports she has received Lymph edema pump from DME supply store.  .  Patient reports she also was given TED hose at recent Vein MD appt. Patient stated Vein MD told her she needed to use Lymph edema pump and keep legs elevated daily.  . [facility]  . [facility] [phone] to clarify who patient's PCS provider would be, she stated last charted noted stated [facility]. Carley Hammedva made me aware how to increase days if patient needed further assistance.  . Patient maintains she continues to be tobacco free, Quit date of November 14 2018.  [name]. Patient reports she had to reschedule her pain MD appt.  . While on the phone Clarified with Catie Pharm-D correct directions for Nebulizer use. . Patient continues to grieve the loss of her mom and is struggling with her dad thinking about dating again.  . Patient states she has a Engineer, civil (consulting)nurse from [facility] who calls her Efraim KaufmannMelissa [phone].  [name]. Secure email sent to [facility] Providers to inquire about PCS service start date.   Patient Self Care Activities:   . Currently UNABLE TO independently perform ADLs or IADLs   Please see past updates related to this goal by clicking on the "Past Updates" button in the selected goal          The care management team will reach out to the patient again over the next 30 days.  The patient has been provided with contact information for the care management team and has been advised to call with any health related questions or concerns.   Ma RingsJanci Kasumi Ditullio RN, BSN Nurse Case Education officer, communityManager [facility] Care Management  [phone]) Business Mobile

## 2019-01-13 ENCOUNTER — Other Ambulatory Visit: Payer: Self-pay

## 2019-01-13 ENCOUNTER — Encounter: Payer: Self-pay | Admitting: Family Medicine

## 2019-01-13 ENCOUNTER — Ambulatory Visit (INDEPENDENT_AMBULATORY_CARE_PROVIDER_SITE_OTHER): Payer: Medicaid Other | Admitting: Family Medicine

## 2019-01-13 VITALS — BP 105/67 | HR 66 | Temp 98.8°F | Ht 64.0 in

## 2019-01-13 DIAGNOSIS — F4310 Post-traumatic stress disorder, unspecified: Secondary | ICD-10-CM | POA: Diagnosis not present

## 2019-01-13 DIAGNOSIS — Z23 Encounter for immunization: Secondary | ICD-10-CM | POA: Diagnosis not present

## 2019-01-13 DIAGNOSIS — I872 Venous insufficiency (chronic) (peripheral): Secondary | ICD-10-CM

## 2019-01-13 DIAGNOSIS — T07XXXA Unspecified multiple injuries, initial encounter: Secondary | ICD-10-CM

## 2019-01-13 DIAGNOSIS — I890 Lymphedema, not elsewhere classified: Secondary | ICD-10-CM

## 2019-01-13 DIAGNOSIS — N3281 Overactive bladder: Secondary | ICD-10-CM | POA: Diagnosis not present

## 2019-01-13 DIAGNOSIS — I10 Essential (primary) hypertension: Secondary | ICD-10-CM | POA: Diagnosis not present

## 2019-01-13 MED ORDER — CLINDAMYCIN HCL 150 MG PO CAPS: 150.0000 mg | ORAL_CAPSULE | Freq: Two times a day (BID) | ORAL | 0 refills | Status: DC

## 2019-01-13 NOTE — Progress Notes (Signed)
BP 105/67   Pulse 66   Temp 98.8 F (37.1 C) (Oral)   Ht 5\' 4"  (1.626 m)   SpO2 96%   BMI 49.78 kg/m    Subjective:    Patient ID: Cheryl Chandler, female    DOB: 1965-05-22, 53 y.o.   MRN: [id number]  HPI: Cheryl Chandler is a 53 y.o. female  Chief Complaint  Patient presents with  . Follow-up    pt would like cologuard test  . Hypertension   Patient here today following up on multiple medical conditions.   Was able to get a lymphedema pump and compression stockings through [facility]. Notes these have been helping some but her stockings were too tight and she now has some raw spots. Has not been wearing them much because of this and that they are very painful. Also notes she's been shaving her legs still against medical advice which caused some broken skin additionally. Has not been putting anything on these wounds of lower legs. Redness worsening in those areas.   Seeing the Cardiologist Oct 12th to establish care.   Home BPs running from 130s-160s/60s-70s. Denies dizziness, syncope, CP, SOB (beyond baseline). Taking her medicines faithfully without side effects.   Tried to cancel the Psychiatry appt that was sheduled for last month but was still charged for a no show. Feels like she is doing ok right now but told them she would call if feeling like she needed help. Denies SI/HI.   Stopped smoking back in June, has been smoking since age 84 prior to this. Very proud of the accomplishment.   Has been having some significant issues with urinary incontinence, wears depends full time and having to change pads several times per day. Has never taken anything for this issue in the past. Was told there are some services to help with supplies for this. Denies dysuria, hematuria, fevers, N/V/D.   Relevant past medical, surgical, family and social history reviewed and updated as indicated. Interim medical history since our last visit reviewed. Allergies and  medications reviewed and updated.  Review of Systems  Per HPI unless specifically indicated above     Objective:    BP 105/67   Pulse 66   Temp 98.8 F (37.1 C) (Oral)   Ht 5\' 4"  (1.626 m)   SpO2 96%   BMI 49.78 kg/m   Wt Readings from Last 3 Encounters:  11/07/18 290 lb (131.5 kg)  09/15/18 300 lb (136.1 kg)  08/16/18 300 lb (136.1 kg)    Physical Exam Vitals signs and nursing note reviewed.  Constitutional:      Appearance: She is obese. She is not ill-appearing.  HENT:     Head: Atraumatic.  Eyes:     Extraocular Movements: Extraocular movements intact.     Conjunctiva/sclera: Conjunctivae normal.  Neck:     Musculoskeletal: Normal range of motion and neck supple.  Cardiovascular:     Rate and Rhythm: Normal rate and regular rhythm.     Heart sounds: Normal heart sounds.  Pulmonary:     Effort: Pulmonary effort is normal. No respiratory distress.     Breath sounds: Normal breath sounds. No wheezing.  Musculoskeletal:     Comments: In wheelchair Significant edema b/l LEs, ttp diffusely  Skin:    General: Skin is warm and dry.     Findings: Erythema (with several areas of abrasions on b/l lower legs above ankles, worsening warmth and redness in these areas) present.  Neurological:  Mental Status: She is alert and oriented to person, place, and time.  Psychiatric:        Mood and Affect: Mood normal.        Thought Content: Thought content normal.        Judgment: Judgment normal.     Results for orders placed or performed in visit on 01/13/19  Comprehensive metabolic panel  Result Value Ref Range   Glucose 96 65 - 99 mg/dL   BUN 14 6 - 24 mg/dL   Creatinine, Ser 1.610.82 0.57 - 1.00 mg/dL   GFR calc non Af Amer 83 >59 mL/min/1.73   GFR calc Af Amer 95 >59 mL/min/1.73   BUN/Creatinine Ratio 17 9 - 23   Sodium 140 134 - 144 mmol/L   Potassium 4.4 3.5 - 5.2 mmol/L   Chloride 102 96 - 106 mmol/L   CO2 25 20 - 29 mmol/L   Calcium 8.7 8.7 - 10.2 mg/dL    Total Protein 6.2 6.0 - 8.5 g/dL   Albumin 3.7 (L) 3.8 - 4.9 g/dL   Globulin, Total 2.5 1.5 - 4.5 g/dL   Albumin/Globulin Ratio 1.5 1.2 - 2.2   Bilirubin Total 0.4 0.0 - 1.2 mg/dL   Alkaline Phosphatase 75 39 - 117 IU/L   AST 22 0 - 40 IU/L   ALT 28 0 - 32 IU/L      Assessment & Plan:   Problem List Items Addressed This Visit      Cardiovascular and Mediastinum   Essential hypertension - Primary    BPs stable and WNL, continue current regimen and home monitoring.       Relevant Orders   Comprehensive metabolic panel (Completed)   Chronic venous insufficiency    Followed by Vascular, continue per their recommendations. Suggested slightly looser compression stockings given broken skin in areas of friction        Genitourinary   Overactive bladder    Wanting to hold off on medication at this time. Discussed following up with CCM team regarding supplies needs        Other   Posttraumatic stress disorder    Cancelled her Psychiatry consultation, declines services at this time. States she's doing better      Lymphedema    Followed by Vascular, on lymphedema compression pumps       Other Visit Diagnoses    Multiple abrasions       lower legs b/l from shaving and compression hose. not actively infected, but erythematous. wound care reviewed, abx sent in case worsening   Flu vaccine need       Relevant Orders   Flu Vaccine QUAD 36+ mos IM (Completed)       Follow up plan: Return in about 3 months (around 04/14/2019) for 6 month f/u.

## 2019-01-14 LAB — COMPREHENSIVE METABOLIC PANEL
ALT: 28 IU/L (ref 0–32)
AST: 22 IU/L (ref 0–40)
Albumin/Globulin Ratio: 1.5 (ref 1.2–2.2)
Albumin: 3.7 g/dL — ABNORMAL LOW (ref 3.8–4.9)
Alkaline Phosphatase: 75 IU/L (ref 39–117)
BUN/Creatinine Ratio: 17 (ref 9–23)
BUN: 14 mg/dL (ref 6–24)
Bilirubin Total: 0.4 mg/dL (ref 0.0–1.2)
CO2: 25 mmol/L (ref 20–29)
Calcium: 8.7 mg/dL (ref 8.7–10.2)
Chloride: 102 mmol/L (ref 96–106)
Creatinine, Ser: 0.82 mg/dL (ref 0.57–1.00)
GFR calc Af Amer: 95 mL/min/{1.73_m2} (ref >59)
GFR calc non Af Amer: 83 mL/min/{1.73_m2} (ref >59)
Globulin, Total: 2.5 g/dL (ref 1.5–4.5)
Glucose: 96 mg/dL (ref 65–99)
Potassium: 4.4 mmol/L (ref 3.5–5.2)
Sodium: 140 mmol/L (ref 134–144)
Total Protein: 6.2 g/dL (ref 6.0–8.5)

## 2019-01-16 ENCOUNTER — Encounter: Payer: Self-pay | Admitting: Family Medicine

## 2019-01-18 ENCOUNTER — Telehealth: Payer: Self-pay

## 2019-01-18 NOTE — Assessment & Plan Note (Signed)
Followed by Vascular, on lymphedema compression pumps

## 2019-01-18 NOTE — Assessment & Plan Note (Signed)
Wanting to hold off on medication at this time. Discussed following up with CCM team regarding supplies needs

## 2019-01-18 NOTE — Assessment & Plan Note (Signed)
Cancelled her Psychiatry consultation, declines services at this time. States she's doing better

## 2019-01-18 NOTE — Assessment & Plan Note (Signed)
BPs stable and WNL, continue current regimen and home monitoring 

## 2019-01-18 NOTE — Assessment & Plan Note (Signed)
Followed by Vascular, continue per their recommendations. Suggested slightly looser compression stockings given broken skin in areas of friction

## 2019-01-23 ENCOUNTER — Ambulatory Visit: Payer: Self-pay | Admitting: Licensed Clinical Social Worker

## 2019-01-23 DIAGNOSIS — F419 Anxiety disorder, unspecified: Secondary | ICD-10-CM

## 2019-01-23 NOTE — Chronic Care Management (AMB) (Signed)
Chronic Care Management    Clinical Social Work Follow Up Note  01/23/2019 Name: Cheryl Chandler MRN: [id number] DOB: [birth year]  Cheryl Chandler is a 53 y.o. year old female who is a primary care patient of [name], [place]. The CCM team was consulted for assistance with [employer] .   Review of patient status, including review of consultants reports, other relevant assessments, and collaboration with appropriate care team members and the patient's provider was performed as part of comprehensive patient evaluation and provision of chronic care management services.    SDOH (Social Determinants of Health) screening performed today: Stress. See Care Plan for related entries.   Outpatient Encounter Medications as of 01/23/2019  Medication Sig Note  . albuterol (PROVENTIL) (2.5 MG/3ML) 0.083% nebulizer solution INHALE CONTENTS OF 1 VIAL IN NEBULIZER EVERY 4 HOURS IF NEEDED FOR WHEEZING OR SHORTNESS OF BREATH. 10/11/2018: Four times daily   . citalopram (CELEXA) 20 MG tablet TAKE 1 TABLET BY MOUTH ONCE DAILY.   . clindamycin (CLEOCIN) 150 MG capsule Take 1 capsule (150 mg total) by mouth 2 (two) times daily.   . DULERA 100-5 MCG/ACT AERO INHALE 2 PUFFS BY MOUTH TWICE A DAY. RINSE MOUTH AFTER USE.   [name] EPINEPHrine 0.3 mg/0.3 mL IJ SOAJ injection USE AS DIRECTED.   . furosemide (LASIX) 40 MG tablet TAKE 1 TABLET BY MOUTH ONCE DAILY   . gabapentin (NEURONTIN) 300 MG capsule TAKE 1 CAPSULE BY MOUTH 3 TIMES DAILY ASNEEDED   . levothyroxine (SYNTHROID) 50 MCG tablet TAKE 1 TABLET BY MOUTH ONCE DAILY ON AN EMPTY STOMACH. WAIT 30 MINUTES BEFORE TAKING OTHER MEDS.   [name] lisinopril (ZESTRIL) 30 MG tablet TAKE 1 TABLET BY MOUTH ONCE DAILY   . meloxicam (MOBIC) 15 MG tablet TAKE 1 TABLET BY MOUTH ONCE A DAY WITH FOOD OR MILK IF NEEDE 10/11/2018: QAM  . methocarbamol (ROBAXIN) 750 MG tablet TAKE 1-2 TABLETS BY MOUTH 3 TIMES DAILY IF NEEDED FOR BACK PAIN.   . montelukast (SINGULAIR)  10 MG tablet Take 1 tablet (10 mg total) by mouth daily.   . nitroGLYCERIN (NITROSTAT) 0.4 MG SL tablet DISSOLVE 1 TABLET UNDER TONGUE AT ONSET OF CHEST PAIN. REPEAT IN 5 MIN IF NOT RESOLVED, MAX 3 DOSES. 911 IF NEEDED.   [name] omeprazole (PRILOSEC) 20 MG capsule Take 1 capsule (20 mg total) by mouth daily.   . simvastatin (ZOCOR) 40 MG tablet TAKE 1 TABLET BY MOUTH ONCE DAILY   . tiotropium (SPIRIVA HANDIHALER) 18 MCG inhalation capsule Place 1 capsule (18 mcg total) into inhaler and inhale daily.   . traMADol (ULTRAM) 50 MG tablet TAKE 1 TABLET BY MOUTH EVERY 8 HOURS AS NEEDED PAIN 10/11/2018: QAM  . Vitamin D, Ergocalciferol, (DRISDOL) 1.25 MG (50000 UT) CAPS capsule Take 1 capsule (50,000 Units total) by mouth every 7 (seven) days.    No facility-administered encounter medications on file as of 01/23/2019.      Goals Addressed    . SW-"I need to focus on taking better care of myself" (pt-stated)       Current Barriers:  . Financial constraints . Mental Health Concerns  . Social Isolation . Limited education about mental health support resources that are available to her within the local area* . Cognitive Deficits . Lacks knowledge of community resource: Grief support resources/programs that are offered free of charge  within the area  Clinical Social Work Clinical Goal(s):  [name] Over the next 90 days, client will work with SW to  address concerns related to experiencing ongoing symptoms of depression, sadness and grief since the passing of her mother . Over the next 90 days, patient will work with LCSW to address needs related to implementing appropriate self-care and depression management.   Interventions: . Patient interviewed and appropriate assessments performed . Provided mental health counseling with regards to patient's loss. Patient reports being active with [facility]. She shares that she has been in contact with a caseworker there that has spent time with her on the phone  and has sent her resource information on available mental health support resources within the area. Patient denies wanting LCSW to make a referral for grief counseling through [facility] again but was receptive to coping skill and resource education/information that was provided during session today. [name] LCSW assisted patient with completing application for [facility]. Patient agreeable to follow up with [facility]. . Provided patient with information about managing grief and depression within her daily life. Patient reports that she talks out loud to her mother's picture and this is very helpful to her. . Provided reflective listening and implemented appropriate interventions to help suppport patient and her emotional needs  . Discussed plans with patient for ongoing care management follow up and provided patient with direct contact information for care management team . Advised patient to contact CCM team with any urgent case management needs. . Assisted patient/caregiver with obtaining information about health plan benefits . LCSW completed referral for family to have a wheelchair ramp build at their residence due to ongoing mobility concerns. Patient reports that [facility] and her husband completed a home visit/assessment. Ramp has been successfully installed.  . Pt reports that she accidentally fell asleep and left her compression hose on and skin came off when she took them off the next morning. She reports ongoing pain with these compression hose and wonders if she could use a bigger size.  [name] Pt was reminded to contact [facility] to follow up on third choice PCS agency as the other two agencies have declined due to short staff.  . Positive reinforcement provided to patient for quitting smoking cigarettes. Coping skill review education provided for future triggers.  . Patient reports that she now has a bedside commode and will be getting a hospital bed from her Aunt  soon.  Patient Self Care Activities:  . Attends all scheduled provider appointments . Calls provider office for new concerns or questions  Please see past updates related to this goal by clicking on the "Past Updates" button in the selected goal      Follow Up Plan: SW will follow up with patient by phone over the next 45 days  [name], BSW, MSW, LCSW [facility] Care Management [place]  [facility] [email or web address] Phone: [phone]

## 2019-01-24 ENCOUNTER — Telehealth: Payer: Self-pay | Admitting: Family Medicine

## 2019-01-24 NOTE — Telephone Encounter (Signed)
FYI

## 2019-01-24 NOTE — Telephone Encounter (Signed)
Pt is calling to let rachel know she was approved for  [facility] the nurse christy will be the one coming to her home . Pt does not know the start date. Pt took the agency Tuesday are a good day for her

## 2019-01-26 DIAGNOSIS — J449 Chronic obstructive pulmonary disease, unspecified: Secondary | ICD-10-CM | POA: Diagnosis not present

## 2019-01-26 DIAGNOSIS — M545 Low back pain: Secondary | ICD-10-CM | POA: Diagnosis not present

## 2019-02-06 ENCOUNTER — Other Ambulatory Visit: Payer: Self-pay | Admitting: Family Medicine

## 2019-02-06 ENCOUNTER — Ambulatory Visit: Payer: Medicaid Other | Admitting: Cardiovascular Disease

## 2019-02-06 DIAGNOSIS — E039 Hypothyroidism, unspecified: Secondary | ICD-10-CM

## 2019-02-06 DIAGNOSIS — J301 Allergic rhinitis due to pollen: Secondary | ICD-10-CM

## 2019-02-08 ENCOUNTER — Ambulatory Visit: Payer: Self-pay | Admitting: *Deleted

## 2019-02-08 DIAGNOSIS — I872 Venous insufficiency (chronic) (peripheral): Secondary | ICD-10-CM

## 2019-02-08 DIAGNOSIS — J449 Chronic obstructive pulmonary disease, unspecified: Secondary | ICD-10-CM

## 2019-02-08 DIAGNOSIS — R600 Localized edema: Secondary | ICD-10-CM

## 2019-02-10 ENCOUNTER — Telehealth: Payer: Self-pay

## 2019-02-12 NOTE — Patient Instructions (Signed)
Thank you allowing the Chronic Care Management Team to be a part of your care! It was a pleasure speaking with you today!  CCM (Chronic Care Management) Team   Ovetta Bazzano RN, BSN Nurse Care Coordinator  [phone]  Catie Edward [place] PharmD  Clinical Pharmacist  [phone]  Eula Fried LCSW Clinical Social Worker [phone]  Goals Addressed            This Visit's Progress   . RN- I need more help in the home (pt-stated)       Current Barriers:  [name] Knowledge Deficits related to managing multiple chronic diseases  . Lacks caregiver support.  . Film/video editor.  . Transportation barriers  Nurse Case Manager Clinical Goal(s):  [name] Over the next 90 days, patient will work with [facility]  to address needs related to Multiple chronic Disease Processes . Over the next 90 days, patient will work with [facility]  (community agency) to Increase Strength and regain some independence   Interventions:  . Evaluation of current treatment plan related to lymph edema and patient's adherence to plan as established by provider. . Discussed plans with patient for ongoing care management follow up and provided patient with direct contact information for care management team  . Patient stated she was evaluated by [facility] provider and they plan to start soon.  . Patient stating she has been wearing her lymph edema pumps q1 hour in the am and pm per MD orders and her compression stockings as needed.  . Patient stated she was unable to go to her cardiology appointment related to she was recently sick...rescheduled for Nov 6. Encouraged patient to please go.  . Patient stating she does not have a good mattress so normally sleeps on the couch. Denies need for hospital bed. Does say she does not sleep well. . Continued family stress related to her mother's passing and her dad now dating again.  . Patient confirms she has all her medications, stable housing, transportation and food.   . Continues to report she has quit smoking    Patient Self Care Activities:   . Currently UNABLE TO independently perform ADLs or IADLs   Please see past updates related to this goal by clicking on the "Past Updates" button in the selected goal         The patient verbalized understanding of instructions provided today and declined a print copy of patient instruction materials.   The patient has been provided with contact information for the care management team and has been advised to call with any health related questions or concerns.

## 2019-02-12 NOTE — Chronic Care Management (AMB) (Signed)
Chronic Care Management   Follow Up Note   02/08/2019 Name: Stella Bortle MRN: [id number] DOB: [birth year]  Referred by: [name], PA-C Reason for referral : Chronic Care Management (COPD )   Aracelie Shawne Bulow is a 53 y.o. year old female who is a primary care patient of [name], PA-C. The CCM team was consulted for assistance with chronic disease management and care coordination needs.    Review of patient status, including review of consultants reports, relevant laboratory and other test results, and collaboration with appropriate care team members and the patient's provider was performed as part of comprehensive patient evaluation and provision of chronic care management services.    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 See Care Plan for related entries.   Advanced Directives Status: N See Care Plan and Vynca application for related entries.  Outpatient Encounter Medications as of 02/08/2019  Medication Sig Note  . albuterol (PROVENTIL) (2.5 MG/3ML) 0.083% nebulizer solution INHALE CONTENTS OF 1 VIAL IN NEBULIZER EVERY 4 HOURS IF NEEDED FOR WHEEZING OR SHORTNESS OF BREATH. 10/11/2018: Four times daily   . citalopram (CELEXA) 20 MG tablet TAKE 1 TABLET BY MOUTH ONCE DAILY.   . clindamycin (CLEOCIN) 150 MG capsule Take 1 capsule (150 mg total) by mouth 2 (two) times daily.   . DULERA 100-5 MCG/ACT AERO INHALE 2 PUFFS BY MOUTH TWICE A DAY. RINSE MOUTH AFTER USE.   [name] EPINEPHrine 0.3 mg/0.3 mL IJ SOAJ injection USE AS DIRECTED.   . furosemide (LASIX) 40 MG tablet TAKE 1 TABLET BY MOUTH ONCE DAILY   . gabapentin (NEURONTIN) 300 MG capsule TAKE 1 CAPSULE BY MOUTH 3 TIMES DAILY ASNEEDED   . levothyroxine (SYNTHROID) 50 MCG tablet TAKE 1 TABLET BY MOUTH ONCE DAILY ON AN EMPTY STOMACH. WAIT 30 MINUTES BEFORE TAKING OTHER MEDS.   [name] lisinopril (ZESTRIL) 30 MG tablet TAKE 1  TABLET BY MOUTH ONCE DAILY   . meloxicam (MOBIC) 15 MG tablet TAKE 1 TABLET BY MOUTH ONCE A DAY WITH FOOD OR MILK IF NEEDE 10/11/2018: QAM  . methocarbamol (ROBAXIN) 750 MG tablet TAKE 1-2 TABLETS BY MOUTH 3 TIMES DAILY IF NEEDED FOR BACK PAIN.   . montelukast (SINGULAIR) 10 MG tablet TAKE 1 TABLET BY MOUTH ONCE DAILY   . nitroGLYCERIN (NITROSTAT) 0.4 MG SL tablet DISSOLVE 1 TABLET UNDER TONGUE AT ONSET OF CHEST PAIN. REPEAT IN 5 MIN IF NOT RESOLVED, MAX 3 DOSES. 911 IF NEEDED.   [name] omeprazole (PRILOSEC) 20 MG capsule Take 1 capsule (20 mg total) by mouth daily.   . simvastatin (ZOCOR) 40 MG tablet TAKE 1 TABLET BY MOUTH ONCE DAILY   . tiotropium (SPIRIVA HANDIHALER) 18 MCG inhalation capsule Place 1 capsule (18 mcg total) into inhaler and inhale daily.   . traMADol (ULTRAM) 50 MG tablet TAKE 1 TABLET BY MOUTH EVERY 8 HOURS AS NEEDED PAIN 10/11/2018: QAM  . Vitamin D, Ergocalciferol, (DRISDOL) 1.25 MG (50000 UT) CAPS capsule Take 1 capsule (50,000 Units total) by mouth every 7 (seven) days.    No facility-administered encounter medications on file as of 02/08/2019.      Goals Addressed            This Visit's Progress   . RN- I need more help in the home (pt-stated)       Current Barriers:  [name] Knowledge Deficits related to managing multiple chronic diseases  . Lacks caregiver support.  . Financial  Constraints.  . Transportation barriers  Nurse Case Manager Clinical Goal(s):  [name] Over the next 90 days, patient will work with [facility]  to address needs related to Multiple chronic Disease Processes . Over the next 90 days, patient will work with [facility]  (community agency) to Increase Strength and regain some independence   Interventions:  . Evaluation of current treatment plan related to lymph edema and patient's adherence to plan as established by provider. . Discussed plans with patient for ongoing care management follow up and provided patient with direct contact information for  care management team  . Patient stated she was evaluated by [facility] provider and they plan to start soon.  . Patient stating she has been wearing her lymph edema pumps q1 hour in the am and pm per MD orders and her compression stockings as needed.  . Patient stated she was unable to go to her cardiology appointment related to she was recently sick...rescheduled for Nov 6. Encouraged patient to please go.  . Patient stating she does not have a good mattress so normally sleeps on the couch. Denies need for hospital bed. Does say she does not sleep well. . Continued family stress related to her mother's passing and her dad now dating again.  . Patient confirms she has all her medications, stable housing, transportation and food.  . Continues to report she has quit smoking    Patient Self Care Activities:   . Currently UNABLE TO independently perform ADLs or IADLs   Please see past updates related to this goal by clicking on the "Past Updates" button in the selected goal          The care management team will reach out to the patient again over the next 30 days.  The patient has been provided with contact information for the care management team and has been advised to call with any health related questions or concerns.    Merlene Morse Walterine Amodei RN, BSN Nurse Case Pharmacist, [facility] Care Management  [phone]) Business Mobile

## 2019-02-20 DIAGNOSIS — J449 Chronic obstructive pulmonary disease, unspecified: Secondary | ICD-10-CM | POA: Diagnosis not present

## 2019-02-20 DIAGNOSIS — I890 Lymphedema, not elsewhere classified: Secondary | ICD-10-CM | POA: Diagnosis not present

## 2019-02-21 ENCOUNTER — Telehealth: Payer: Self-pay

## 2019-02-22 ENCOUNTER — Encounter: Payer: Self-pay | Admitting: Family Medicine

## 2019-02-22 ENCOUNTER — Ambulatory Visit (INDEPENDENT_AMBULATORY_CARE_PROVIDER_SITE_OTHER): Payer: Medicaid Other | Admitting: Family Medicine

## 2019-02-22 ENCOUNTER — Other Ambulatory Visit: Payer: Self-pay

## 2019-02-22 ENCOUNTER — Ambulatory Visit: Payer: Self-pay | Admitting: *Deleted

## 2019-02-22 VITALS — BP 150/68 | HR 70 | Temp 99.0°F | Ht 64.0 in | Wt 335.0 lb

## 2019-02-22 DIAGNOSIS — R21 Rash and other nonspecific skin eruption: Secondary | ICD-10-CM | POA: Diagnosis not present

## 2019-02-22 DIAGNOSIS — R0609 Other forms of dyspnea: Secondary | ICD-10-CM

## 2019-02-22 DIAGNOSIS — I890 Lymphedema, not elsewhere classified: Secondary | ICD-10-CM

## 2019-02-22 MED ORDER — TRIAMCINOLONE ACETONIDE 0.1 % EX CREA: 1.0000 "application " | TOPICAL_CREAM | Freq: Two times a day (BID) | CUTANEOUS | 0 refills | Status: DC

## 2019-02-22 NOTE — Progress Notes (Signed)
BP (!) 150/68   Pulse 70   Temp 99 F (37.2 C) (Oral)   Ht 5\' 4"  (1.626 m)   Wt (!) 335 lb (152 kg)   SpO2 98%   BMI 57.50 kg/m    Subjective:    Patient ID: , female    DOB: March 02, 1966, 53 y.o.   MRN: [id number]  HPI: Cheryl Chandler is a 53 y.o. female  Chief Complaint  Patient presents with  . Rash    on lower arms and lower back since Monday. itchy    Patient presents today for itchy bites to forearms and back, worst on right forearm and left low back/flank. Noticed these the past 2 days. States her pets at home are on flea medications but a friend brought a dog over who had fleas recently. 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 Has been trying benadryl cream with minimal relief. No fevers, chills, sweats, new soaps or other exposures that could be causing itchy rash.   Relevant past medical, surgical, family and social history reviewed and updated as indicated. Interim medical history since our last visit reviewed. Allergies and medications reviewed and updated.  Review of Systems  Per HPI unless specifically indicated above     Objective:    BP (!) 150/68   Pulse 70   Temp 99 F (37.2 C) (Oral)   Ht 5\' 4"  (1.626 m)   Wt (!) 335 lb (152 kg)   SpO2 98%   BMI 57.50 kg/m   Wt Readings from Last 3 Encounters:  02/22/19 (!) 335 lb (152 kg)  11/07/18 290 lb (131.5 kg)  09/15/18 300 lb (136.1 kg)    Physical Exam Vitals signs and nursing note reviewed.  Constitutional:      Appearance: Normal appearance. She is not ill-appearing.  HENT:     Head: Atraumatic.  Eyes:     Extraocular Movements: Extraocular movements intact.     Conjunctiva/sclera: Conjunctivae normal.  Neck:     Musculoskeletal: Normal range of motion and neck supple.  Cardiovascular:     Rate and Rhythm: Normal rate and regular rhythm.     Heart sounds: Normal heart sounds.  Pulmonary:     Effort: Pulmonary effort is normal.     Breath sounds: Normal breath sounds.   Musculoskeletal:     Comments: In wheelchair   Skin:    General: Skin is warm and dry.     Findings: Rash (erythematous papules in clusters on forearms and left low back. no evidence of infection) present.  Neurological:     Mental Status: She is alert and oriented to person, place, and time.  Psychiatric:        Mood and Affect: Mood normal.        Thought Content: Thought content normal.        Judgment: Judgment normal.     Results for orders placed or performed in visit on 01/13/19  Comprehensive metabolic panel  Result Value Ref Range   Glucose 96 65 - 99 mg/dL   BUN 14 6 - 24 mg/dL   Creatinine, Ser 09/17/18 0.57 - 1.00 mg/dL   GFR calc non Af Amer 83 >59 mL/min/1.73   GFR calc Af Amer 95 >59 mL/min/1.73   BUN/Creatinine Ratio 17 9 - 23   Sodium 140 134 - 144 mmol/L   Potassium 4.4 3.5 - 5.2 mmol/L   Chloride 102 96 - 106 mmol/L   CO2 25 20 - 29 mmol/L   Calcium  8.7 8.7 - 10.2 mg/dL   Total Protein 6.2 6.0 - 8.5 g/dL   Albumin 3.7 (L) 3.8 - 4.9 g/dL   Globulin, Total 2.5 1.5 - 4.5 g/dL   Albumin/Globulin Ratio 1.5 1.2 - 2.2   Bilirubin Total 0.4 0.0 - 1.2 mg/dL   Alkaline Phosphatase 75 39 - 117 IU/L   AST 22 0 - 40 IU/L   ALT 28 0 - 32 IU/L      Assessment & Plan:   Problem List Items Addressed This Visit    None    Visit Diagnoses    Rash    -  Primary   Consistent with flea bites. Tx with triamcinolone cream, antihistamines, ice prn. Treat house and pets thoroughly.        Follow up plan: Return if symptoms worsen or fail to improve.

## 2019-02-23 NOTE — Chronic Care Management (AMB) (Signed)
Chronic Care Management   Follow Up Note   02/23/2019 Name: Cheryl Chandler MRN: [id number] DOB: 1965/12/11  Referred by: [name], PA-C   Chronic Care Management   Follow Up Note   02/23/2019 Name: Cheryl Chandler MRN: [id number] DOB: 18-Apr-1966  Referred by: [name], PA-C Reason for referral : Chronic Care Management (Bug bites)   Cheryl Chandler is a 53 y.o. year old female who is a primary care patient of [name], PA-C. The CCM team was consulted for assistance with chronic disease management and care coordination needs.     Met with patient and spouse in person at PCP office.   Review of patient status, including review of consultants reports, relevant laboratory and other test results, and collaboration with appropriate care team members and the patient's provider was performed as part of comprehensive patient evaluation and provision of chronic care management services.    SDOH (Social Determinants of Health) screening performed today: Financial Strain  Tobacco Use Physical Activity. See Care Plan for related entries.   Advanced Directives Status: N See Care Plan and Vynca application for related entries.  Outpatient Encounter Medications as of 02/22/2019  Medication Sig Note  . albuterol (PROVENTIL) (2.5 MG/3ML) 0.083% nebulizer solution INHALE CONTENTS OF 1 VIAL IN NEBULIZER EVERY 4 HOURS IF NEEDED FOR WHEEZING OR SHORTNESS OF BREATH. 10/11/2018: Four times daily   . citalopram (CELEXA) 20 MG tablet TAKE 1 TABLET BY MOUTH ONCE DAILY.   . DULERA 100-5 MCG/ACT AERO INHALE 2 PUFFS BY MOUTH TWICE A DAY. RINSE MOUTH AFTER USE.   [name] EPINEPHrine 0.3 mg/0.3 mL IJ SOAJ injection USE AS DIRECTED.   . furosemide (LASIX) 40 MG tablet TAKE 1 TABLET BY MOUTH ONCE DAILY   . gabapentin (NEURONTIN) 300 MG capsule TAKE 1 CAPSULE BY MOUTH 3 TIMES DAILY ASNEEDED   . levothyroxine (SYNTHROID) 50 MCG tablet TAKE 1 TABLET BY MOUTH ONCE  DAILY ON AN EMPTY STOMACH. WAIT 30 MINUTES BEFORE TAKING OTHER MEDS.   [name] lisinopril (ZESTRIL) 30 MG tablet TAKE 1 TABLET BY MOUTH ONCE DAILY   . meloxicam (MOBIC) 15 MG tablet TAKE 1 TABLET BY MOUTH ONCE A DAY WITH FOOD OR MILK IF NEEDE 10/11/2018: QAM  . methocarbamol (ROBAXIN) 750 MG tablet TAKE 1-2 TABLETS BY MOUTH 3 TIMES DAILY IF NEEDED FOR BACK PAIN.   . montelukast (SINGULAIR) 10 MG tablet TAKE 1 TABLET BY MOUTH ONCE DAILY   . nitroGLYCERIN (NITROSTAT) 0.4 MG SL tablet DISSOLVE 1 TABLET UNDER TONGUE AT ONSET OF CHEST PAIN. REPEAT IN 5 MIN IF NOT RESOLVED, MAX 3 DOSES. 911 IF NEEDED.   [name] omeprazole (PRILOSEC) 20 MG capsule Take 1 capsule (20 mg total) by mouth daily.   . simvastatin (ZOCOR) 40 MG tablet TAKE 1 TABLET BY MOUTH ONCE DAILY   . tiotropium (SPIRIVA HANDIHALER) 18 MCG inhalation capsule Place 1 capsule (18 mcg total) into inhaler and inhale daily.   . traMADol (ULTRAM) 50 MG tablet TAKE 1 TABLET BY MOUTH EVERY 8 HOURS AS NEEDED PAIN 10/11/2018: QAM  . triamcinolone cream (KENALOG) 0.1 % Apply 1 application topically 2 (two) times daily.   . Vitamin D, Ergocalciferol, (DRISDOL) 1.25 MG (50000 UT) CAPS capsule Take 1 capsule (50,000 Units total) by mouth every 7 (seven) days.    No facility-administered encounter medications on file as of 02/22/2019.      Goals Addressed            This Visit's Progress   .  RN- I need more help in the home (pt-stated)       Current Barriers:  [name] Knowledge Deficits related to managing multiple chronic diseases  . Lacks caregiver support.  . Film/video editor.  . Transportation barriers  Nurse Case Manager Clinical Goal(s):  [name] Over the next 90 days, patient will work with [facility]  to address needs related to Multiple chronic Disease Processes . Over the next 90 days, patient will work with [facility]  (community agency) to Increase Strength and regain some independence   Interventions:  . Evaluation of current treatment plan  related to lymph edema and patient's adherence to plan as established by provider. . Discussed plans with patient for ongoing care management follow up and provided patient with direct contact information for care management team  . Met with patient at PCP visit for several bug bites. Patient unsure where the bites were from. She does have 2 dogs but reports she gives them flea medication.  . Patient stated the PCS started this week and it was very helpful.  . Patient stated she had started to cut back on food portions and was wanting to diet. Current actual weight 335lbs. Will continue to work with her about this.  [name] Spouse was with patient during visit.      Patient Self Care Activities:   . Currently UNABLE TO independently perform ADLs or IADLs   Please see past updates related to this goal by clicking on the "Past Updates" button in the selected goal          The care management team will reach out to the patient again over the next 30 days.  The patient has been provided with contact information for the care management team and has been advised to call with any health related questions or concerns.    Merlene Morse Anairis Knick RN, BSN Nurse Case Editor, [facility] Care Management  [phone]) Business Mobile

## 2019-02-23 NOTE — Patient Instructions (Signed)
Thank you allowing the Chronic Care Management Team to be a part of your care! It was a pleasure speaking with you today!  CCM (Chronic Care Management) Team   Cherina Dhillon RN, BSN Nurse Care Coordinator  [phone]  Catie [facility] PharmD  Clinical Pharmacist  [phone]  Eula Fried LCSW Clinical Social Worker [phone]  Goals Addressed            This Visit's Progress   . RN- I need more help in the home (pt-stated)       Current Barriers:  [name] Knowledge Deficits related to managing multiple chronic diseases  . Lacks caregiver support.  . Film/video editor.  . Transportation barriers  Nurse Case Manager Clinical Goal(s):  [name] Over the next 90 days, patient will work with [facility]  to address needs related to Multiple chronic Disease Processes . Over the next 90 days, patient will work with [facility]  (community agency) to Increase Strength and regain some independence   Interventions:  . Evaluation of current treatment plan related to lymph edema and patient's adherence to plan as established by provider. . Discussed plans with patient for ongoing care management follow up and provided patient with direct contact information for care management team  . Met with patient at PCP visit for several bug bites. Patient unsure where the bites were from. She does have 2 dogs but reports she gives them flea medication.  . Did discuss patient stating there are some holes in her floor in the back room which could have contributed to a bug getting in the home. Talked with patient and spouse about calling the volunteers who installed her ramp to see if they would have the resources to fix her floor.  Patient stated she would. Patient stated she was able to get some free wood from [employer]. . Patient stated the PCS started this week and it was very helpful.  . Patient stated she had started to cut back on food portions and was wanting to diet. Current actual weight  335lbs. Will continue to work with her about this.  [name] Spouse was with patient during visit assisting patient with mobility in her wheelchair.  . Patient is continuing to not smoke, she states she has been tobacco free for over 4 months now.    Patient Self Care Activities:   . Currently UNABLE TO independently perform ADLs or IADLs   Please see past updates related to this goal by clicking on the "Past Updates" button in the selected goal         The patient verbalized understanding of instructions provided today and declined a print copy of patient instruction materials.   The patient has been provided with contact information for the care management team and has been advised to call with any health related questions or concerns.

## 2019-02-26 DIAGNOSIS — J449 Chronic obstructive pulmonary disease, unspecified: Secondary | ICD-10-CM | POA: Diagnosis not present

## 2019-02-27 ENCOUNTER — Ambulatory Visit: Payer: Medicaid Other | Admitting: Cardiovascular Disease

## 2019-03-03 ENCOUNTER — Ambulatory Visit: Payer: Medicaid Other | Admitting: Internal Medicine

## 2019-03-08 ENCOUNTER — Telehealth: Payer: Self-pay

## 2019-03-13 ENCOUNTER — Ambulatory Visit: Payer: Self-pay | Admitting: Licensed Clinical Social Worker

## 2019-03-13 DIAGNOSIS — F331 Major depressive disorder, recurrent, moderate: Secondary | ICD-10-CM

## 2019-03-13 NOTE — Chronic Care Management (AMB) (Signed)
Care Management   Follow Up Note   03/13/2019 Name: Cheryl Chandler MRN: [id number] DOB: 1966/04/10  Referred by: [name], PA-C Reason for referral : Care Coordination   Cheryl Chandler is a 53 y.o. year old female who is a primary care patient of Particia Chandler, [place]. The care management team was consulted for assistance with care management and care coordination needs.    Review of patient status, including review of consultants reports, relevant laboratory and other test results, and collaboration with appropriate care team members and the patient's provider was performed as part of comprehensive patient evaluation and provision of chronic care management services.    SDOH (Social Determinants of Health) screening performed today: Depression  . See Care Plan for related entries.   Advanced Directives: See Care Plan and Vynca application for related entries.   Goals Addressed    . SW-"I need to focus on taking better care of myself" (pt-stated)       Current Barriers:  . Financial constraints . Mental Health Concerns  . Social Isolation . Limited education about mental health support resources that are available to her within the local area* . Cognitive Deficits . Lacks knowledge of community resource: Grief support resources/programs that are offered free of charge  within the area  Clinical Social Work Clinical Goal(s):  [name] Over the next 90 days, client will work with SW to address concerns related to experiencing ongoing symptoms of depression, sadness and grief since the passing of her mother . Over the next 90 days, patient will work with LCSW to address needs related to implementing appropriate self-care and depression management.   Interventions: . Patient interviewed and appropriate assessments performed . Provided mental health counseling with regards to patient's loss. Patient denies wanting LCSW to make a referral for grief counseling  through [facility] again but was receptive to coping skill and resource education/information that was provided during session today. . Provided patient with information about managing grief and depression within her daily life. Patient reports that she talks out loud to her mother's picture and this is very helpful to her. . Provided reflective listening and implemented appropriate interventions to help suppport patient and her emotional needs  . Discussed plans with patient for ongoing care management follow up and provided patient with direct contact information for care management team . Advised patient to contact CCM team with any urgent case management needs. . Assisted patient/caregiver with obtaining information about health plan benefits . LCSW completed referral for family to have a wheelchair ramp built at their residence due to ongoing mobility concerns. Ramp has been successfully installed.  [name] PCS actively involved with patient and providing ongoing services.  [name] Positive reinforcement provided to patient for quitting smoking cigarettes. Coping skill review education provided for future triggers.  . Patient reports that she now has a bedside commode and will be getting a hospital bed from her Aunt soon. . Patient and spouse put up christmas decorations this week in remembrance of their lost family members that passed recently. Positive reinforcement provided to family for using their creativity within their home.  . Brief self-care education provided to both patient and spouse.  Patient Self Care Activities:  . Attends all scheduled provider appointments . Calls provider office for new concerns or questions  Please see past updates related to this goal by clicking on the "Past Updates" button in the selected goal      The care management team will reach out  to the patient again over the next quarter days.   Cheryl Chandler, BSW, MSW, [facility] Care Management  [email or web address] Phone: [phone]

## 2019-03-17 ENCOUNTER — Ambulatory Visit: Payer: Self-pay | Admitting: Licensed Clinical Social Worker

## 2019-03-17 NOTE — Chronic Care Management (AMB) (Signed)
  Care Management   Follow Up Note   03/17/2019 Name: [name] MRN: [id number] DOB: 03/16/1966  Referred by: [name], PA-C Reason for referral : Care Coordination   Cheryl Chandler is a 53 y.o. year old female who is a primary care patient of [name], [place]. The care management team was consulted for assistance with care management and care coordination needs.    Review of patient status, including review of consultants reports, relevant laboratory and other test results, and collaboration with appropriate care team members and the patient's provider was performed as part of comprehensive patient evaluation and provision of chronic care management services.    LCSW received incoming call from patient on 03/17/2019. Patient recently lost her last aide but reports that her PCS agency was able to find a new one for her that will start next week. Patient reports that her new PCS hours will be 4:00 pm - 7:00 pm.   The care management team will reach out to the patient again over the next 45 days.   Eula Fried, BSW, MSW, [facility] Care [facility] [email or web address] Phone: [phone]

## 2019-03-21 ENCOUNTER — Telehealth: Payer: Self-pay | Admitting: Family Medicine

## 2019-03-21 NOTE — Telephone Encounter (Signed)
OK to give verbal.

## 2019-03-21 NOTE — Telephone Encounter (Signed)
Called and left a message giving the verbal order for adult diapers.

## 2019-03-21 NOTE — Telephone Encounter (Signed)
Home Health Verbal Orders - Caller/Agency: Lenna Sciara, Nurse care manager at [facility] Number: [phone]  Requesting OT/PT/Skilled Nursing/Social Work/Speech Therapy: Requesting adult diapers size XL, prescription request for depends or any other adult diapers. Pt has over active bladder

## 2019-03-28 ENCOUNTER — Ambulatory Visit: Payer: Self-pay | Admitting: Pharmacist

## 2019-03-28 DIAGNOSIS — J449 Chronic obstructive pulmonary disease, unspecified: Secondary | ICD-10-CM | POA: Diagnosis not present

## 2019-03-28 DIAGNOSIS — I890 Lymphedema, not elsewhere classified: Secondary | ICD-10-CM

## 2019-03-28 DIAGNOSIS — I10 Essential (primary) hypertension: Secondary | ICD-10-CM

## 2019-03-28 NOTE — Patient Instructions (Signed)
Visit Information  Goals Addressed            This Visit's Progress     Patient Stated   . PharmD "I have depression, anxiety, and chronic pain" (pt-stated)       Current Barriers:  . Polypharmacy; complex patient with multiple comorbidities including HTN, venous insufficiency, COPD, peripheral neuropathy, depression/anxiety, arthritis.  . Reports more pain in her legs/back lately.  . Reports that she is struggling w/ her current [facility] agency. Multiple people have made appointments but then had car troubles, and she notes that someone was supposed to come on Saturday but never showed. She attempted to get in touch with the company and has been unable to reach someone. She is contemplating getting help from another agency . Wonders if Medicaid covers Depends . Notes that she needs to schedule f/u appointments for some specialists that she had missed- Cardiology, Pain. Notes that she has these phone numbers at home with her.  . Self-manages medications by using a pill box o HTN/ASCVD risk reduction: lisinopril 30 mg daily; simvastatin 40 mg daily - Notes she needs to r/s appt with cardiology o Venous insufficiency: furosemide 40 mg daily; notes more pain in her legs recently.  o COPD/Asthma: montelukast 10 mg daily; Spiriva 18 mcg daily;  o Peripheral neuropathy/pain: gabapentin 300 mg, meloxicam 15 mg, methocarbamol 750 mg daily, tramadol 50 mg daily aware she needs to r/s appt w/ Pain Management  o Depression/anxiety: citalopram 20 mg daily  o Hypothyroid: levothyroxine 50 mcg o GERD: omeprazole 20 mg daily  Pharmacist Clinical Goal(s):  [name] Over the next 90 days, patient will work with PharmD and provider towards optimized medication management  Interventions: . Comprehensive medication review performed; medication list updated in electronic medical record . Encouraged patient to communicate with the manager/supervisor for her home health company regarding scheduling concerns. Will  collaborate w/ LCSW if patient has other [place] options . Will collaborate w/ LCSW on patient's request for Medicaid-covered Depends . Encouraged patient to schedule f/u with cardiology and pain management. Will collaborate w/ CCM team to provide support and accountability for this.   Patient Self Care Activities:  . Patient will take medications as prescribed . Patient will schedule f/u with specialty providers and attend these appointments  Please see past updates related to this goal by clicking on the "Past Updates" button in the selected goal         The patient verbalized understanding of instructions provided today and declined a print copy of patient instruction materials.   Plan: - Will outreach patient for continued medication management support in the next 6-8 weeks  [name], PharmD, [place] [phone]

## 2019-03-28 NOTE — Chronic Care Management (AMB) (Signed)
Chronic Care Management   Follow Up Note   03/28/2019 Name: Cheryl Chandler MRN: [id number] DOB: 27-Apr-1966  Referred by: [name], PA-C Reason for referral : Chronic Care Management (Medication Management)   Cheryl Chandler is a 53 y.o. year old female who is a primary care patient of [name], PA-C. The CCM team was consulted for assistance with chronic disease management and care coordination needs.     Contacted patient for medication management review today.   Review of patient status, including review of consultants reports, relevant laboratory and other test results, and collaboration with appropriate care team members and the patient's provider was performed as part of comprehensive patient evaluation and provision of chronic care management services.    SDOH (Social Determinants of Health) screening performed today: Stress. See Care Plan for related entries.   Outpatient Encounter Medications as of 03/28/2019  Medication Sig Note   albuterol (PROVENTIL) (2.5 MG/3ML) 0.083% nebulizer solution INHALE CONTENTS OF 1 VIAL IN NEBULIZER EVERY 4 HOURS IF NEEDED FOR WHEEZING OR SHORTNESS OF BREATH. 10/11/2018: Four times daily    citalopram (CELEXA) 20 MG tablet TAKE 1 TABLET BY MOUTH ONCE DAILY.    DULERA 100-5 MCG/ACT AERO INHALE 2 PUFFS BY MOUTH TWICE A DAY. RINSE MOUTH AFTER USE.    EPINEPHrine 0.3 mg/0.3 mL IJ SOAJ injection USE AS DIRECTED.    furosemide (LASIX) 40 MG tablet TAKE 1 TABLET BY MOUTH ONCE DAILY    gabapentin (NEURONTIN) 300 MG capsule TAKE 1 CAPSULE BY MOUTH 3 TIMES DAILY ASNEEDED    levothyroxine (SYNTHROID) 50 MCG tablet TAKE 1 TABLET BY MOUTH ONCE DAILY ON AN EMPTY STOMACH. WAIT 30 MINUTES BEFORE TAKING OTHER MEDS.    lisinopril (ZESTRIL) 30 MG tablet TAKE 1 TABLET BY MOUTH ONCE DAILY    meloxicam (MOBIC) 15 MG tablet TAKE 1 TABLET BY MOUTH ONCE A DAY WITH FOOD OR MILK IF NEEDE 10/11/2018: QAM   methocarbamol (ROBAXIN)  750 MG tablet TAKE 1-2 TABLETS BY MOUTH 3 TIMES DAILY IF NEEDED FOR BACK PAIN.    montelukast (SINGULAIR) 10 MG tablet TAKE 1 TABLET BY MOUTH ONCE DAILY    nitroGLYCERIN (NITROSTAT) 0.4 MG SL tablet DISSOLVE 1 TABLET UNDER TONGUE AT ONSET OF CHEST PAIN. REPEAT IN 5 MIN IF NOT RESOLVED, MAX 3 DOSES. 911 IF NEEDED.    omeprazole (PRILOSEC) 20 MG capsule Take 1 capsule (20 mg total) by mouth daily.    simvastatin (ZOCOR) 40 MG tablet TAKE 1 TABLET BY MOUTH ONCE DAILY    tiotropium (SPIRIVA HANDIHALER) 18 MCG inhalation capsule Place 1 capsule (18 mcg total) into inhaler and inhale daily.    traMADol (ULTRAM) 50 MG tablet TAKE 1 TABLET BY MOUTH EVERY 8 HOURS AS NEEDED PAIN 10/11/2018: QAM   triamcinolone cream (KENALOG) 0.1 % Apply 1 application topically 2 (two) times daily.    Vitamin D, Ergocalciferol, (DRISDOL) 1.25 MG (50000 UT) CAPS capsule Take 1 capsule (50,000 Units total) by mouth every 7 (seven) days.    No facility-administered encounter medications on file as of 03/28/2019.      Goals Addressed            This Visit's Progress     Patient Stated    PharmD "I have depression, anxiety, and chronic pain" (pt-stated)       Current Barriers:   Polypharmacy; complex patient with multiple comorbidities including HTN, venous insufficiency, COPD, peripheral neuropathy, depression/anxiety, arthritis.   Reports more pain in her legs/back lately.  Reports that she is struggling w/ her current [facility] agency. Multiple people have made appointments but then had car troubles, and she notes that someone was supposed to come on Saturday but never showed. She attempted to get in touch with the company and has been unable to reach someone. She is contemplating getting help from another agency  Wonders if Medicaid covers Depends  Notes that she needs to schedule f/u appointments for some specialists that she had missed- Cardiology, Pain. Notes that she has these phone numbers at home with  her.   Self-manages medications by using a pill box o HTN/ASCVD risk reduction: lisinopril 30 mg daily; simvastatin 40 mg daily - Notes she needs to r/s appt with cardiology o Venous insufficiency: furosemide 40 mg daily; notes more pain in her legs recently.  o COPD/Asthma: montelukast 10 mg daily; Spiriva 18 mcg daily;  o Peripheral neuropathy/pain: gabapentin 300 mg, meloxicam 15 mg, methocarbamol 750 mg daily, tramadol 50 mg daily aware she needs to r/s appt w/ Pain Management  o Depression/anxiety: citalopram 20 mg daily  o Hypothyroid: levothyroxine 50 mcg o GERD: omeprazole 20 mg daily  Pharmacist Clinical Goal(s):   Over the next 90 days, patient will work with PharmD and provider towards optimized medication management  Interventions:  Comprehensive medication review performed; medication list updated in electronic medical record  Encouraged patient to communicate with the manager/supervisor for her home health company regarding scheduling concerns. Will collaborate w/ LCSW if patient has other [place] options  Will collaborate w/ LCSW on patient's request for Medicaid-covered Depends  Encouraged patient to schedule f/u with cardiology and pain management. Will collaborate w/ CCM team to provide support and accountability for this.   Patient Self Care Activities:   Patient will take medications as prescribed  Patient will schedule f/u with specialty providers and attend these appointments  Please see past updates related to this goal by clicking on the "Past Updates" button in the selected goal          Plan: - Will outreach patient for continued medication management support in the next 6-8 weeks  [name], PharmD, [place] [phone]

## 2019-04-03 ENCOUNTER — Other Ambulatory Visit: Payer: Self-pay | Admitting: Family Medicine

## 2019-04-03 DIAGNOSIS — R05 Cough: Secondary | ICD-10-CM

## 2019-04-03 DIAGNOSIS — R0602 Shortness of breath: Secondary | ICD-10-CM

## 2019-04-03 DIAGNOSIS — R059 Cough, unspecified: Secondary | ICD-10-CM

## 2019-04-07 ENCOUNTER — Ambulatory Visit: Payer: Self-pay | Admitting: *Deleted

## 2019-04-07 DIAGNOSIS — I10 Essential (primary) hypertension: Secondary | ICD-10-CM

## 2019-04-07 DIAGNOSIS — R0609 Other forms of dyspnea: Secondary | ICD-10-CM

## 2019-04-07 NOTE — Chronic Care Management (AMB) (Signed)
Chronic Care Management   Follow Up Note   04/07/2019 Name: Cheryl Chandler MRN: [id number] DOB: 1965/10/01  Referred by: [name], PA-C Reason for referral : No chief complaint on file.   Cheryl Chandler is a 53 y.o. year old female who is a primary care patient of [name], [place]. The CCM team was consulted for assistance with chronic disease management and care coordination needs.    Review of patient status, including review of consultants reports, relevant laboratory and other test results, and collaboration with appropriate care team members and the patient's provider was performed as part of comprehensive patient evaluation and provision of chronic care management services.    SDOH (Social Determinants of Health) screening performed today: None. See Care Plan for related entries.   Outpatient Encounter Medications as of 04/07/2019  Medication Sig Note  . albuterol (PROVENTIL) (2.5 MG/3ML) 0.083% nebulizer solution INHALE CONTENTS OF 1 VIAL IN NEBULIZER EVERY 4 HOURS IF NEEDED FOR WHEEZING OR SHORTNESS OF BREATH. 10/11/2018: Four times daily   . citalopram (CELEXA) 20 MG tablet TAKE 1 TABLET BY MOUTH ONCE DAILY.   . DULERA 100-5 MCG/ACT AERO INHALE 2 PUFFS BY MOUTH TWICE A DAY. RINSE MOUTH AFTER USE.   [name] EPINEPHrine 0.3 mg/0.3 mL IJ SOAJ injection USE AS DIRECTED.   . furosemide (LASIX) 40 MG tablet TAKE 1 TABLET BY MOUTH ONCE DAILY   . gabapentin (NEURONTIN) 300 MG capsule TAKE 1 CAPSULE BY MOUTH 3 TIMES DAILY ASNEEDED   . levothyroxine (SYNTHROID) 50 MCG tablet TAKE 1 TABLET BY MOUTH ONCE DAILY ON AN EMPTY STOMACH. WAIT 30 MINUTES BEFORE TAKING OTHER MEDS.   [name] lisinopril (ZESTRIL) 30 MG tablet TAKE 1 TABLET BY MOUTH ONCE DAILY   . meloxicam (MOBIC) 15 MG tablet TAKE 1 TABLET BY MOUTH ONCE A DAY WITH FOOD OR MILK IF NEEDE 10/11/2018: QAM  . methocarbamol (ROBAXIN) 750 MG tablet TAKE 1-2 TABLETS BY MOUTH 3 TIMES DAILY IF NEEDED FOR BACK PAIN.     . montelukast (SINGULAIR) 10 MG tablet TAKE 1 TABLET BY MOUTH ONCE DAILY   . nitroGLYCERIN (NITROSTAT) 0.4 MG SL tablet DISSOLVE 1 TABLET UNDER TONGUE AT ONSET OF CHEST PAIN. REPEAT IN 5 MIN IF NOT RESOLVED, MAX 3 DOSES. 911 IF NEEDED.   [name] omeprazole (PRILOSEC) 20 MG capsule Take 1 capsule (20 mg total) by mouth daily.   . simvastatin (ZOCOR) 40 MG tablet TAKE 1 TABLET BY MOUTH ONCE DAILY   . tiotropium (SPIRIVA HANDIHALER) 18 MCG inhalation capsule Place 1 capsule (18 mcg total) into inhaler and inhale daily.   . traMADol (ULTRAM) 50 MG tablet TAKE 1 TABLET BY MOUTH EVERY 8 HOURS AS NEEDED PAIN 10/11/2018: QAM  . triamcinolone cream (KENALOG) 0.1 % Apply 1 application topically 2 (two) times daily.   . Vitamin D, Ergocalciferol, (DRISDOL) 1.25 MG (50000 UT) CAPS capsule Take 1 capsule (50,000 Units total) by mouth every 7 (seven) days.    No facility-administered encounter medications on file as of 04/07/2019.     Goals Addressed            This Visit's Progress   . RN- I need more help in the home (pt-stated)       Current Barriers:  [name] Knowledge Deficits related to managing multiple chronic diseases  . Lacks caregiver support.  . Film/video editor.  . Transportation barriers  Nurse Case Manager Clinical Goal(s):  [name] Over the next 90 days, patient will work with [facility]  to  address needs related to Multiple chronic Disease Processes . Over the next 90 days, patient will work with [facility]  (community agency) to Increase Strength and regain some independence   Interventions:  . Collaborated with PCP regarding Patient's need for new cardiology referral because  patient was unable to go to the initial appointment x2 related to not feeling well. Cardiology office has initial referral.  . Discussed plans with patient for ongoing care management follow up and provided patient with direct contact information for care management team  . Patient called to request a cardiology  referral to [facility] with Dr. Adrian Blackwater where her spouse goes. Patient explained she was unable to make her previous cardiology appointments x2 and they were unable to reschedule.    Patient Self Care Activities:   . Currently UNABLE TO independently perform ADLs or IADLs   Please see past updates related to this goal by clicking on the "Past Updates" button in the selected goal          The patient has been provided with contact information for the care management team and has been advised to call with any health related questions or concerns.    Ma Rings Darshan Solanki RN, BSN Nurse Case Education officer, [facility]  [phone]) Business Mobile

## 2019-04-09 NOTE — Patient Instructions (Signed)
Thank you allowing the Chronic Care Management Team to be a part of your care! It was a pleasure speaking with you today!  CCM (Chronic Care Management) Team   Zayed Griffie RN, BSN Nurse Care Coordinator  [phone]  Catie [facility] PharmD  Clinical Pharmacist  [phone]  Eula Fried LCSW Clinical Social Worker [phone]  Goals Addressed            This Visit's Progress   . RN- I need more help in the home (pt-stated)       Current Barriers:  [name] Knowledge Deficits related to managing multiple chronic diseases  . Lacks caregiver support.  . Film/video editor.  . Transportation barriers  Nurse Case Manager Clinical Goal(s):  [name] Over the next 90 days, patient will work with [facility]  to address needs related to Multiple chronic Disease Processes . Over the next 90 days, patient will work with [facility]  (community agency) to Increase Strength and regain some independence   Interventions:  . Collaborated with PCP regarding Patient's need for new cardiology referral because  patient was unable to go to the initial appointment x2 related to not feeling well. Cardiology office has initial referral.  . Discussed plans with patient for ongoing care management follow up and provided patient with direct contact information for care management team  . Patient called to request a cardiology referral to [place] with Dr. Neoma Laming where her spouse goes. Patient explained she was unable to make her previous cardiology appointments x2 and they were unable to reschedule.    Patient Self Care Activities:   . Currently UNABLE TO independently perform ADLs or IADLs   Please see past updates related to this goal by clicking on the "Past Updates" button in the selected goal         The patient verbalized understanding of instructions provided today and declined a print copy of patient instruction materials.   The patient has been provided with contact  information for the care management team and has been advised to call with any health related questions or concerns.

## 2019-04-11 ENCOUNTER — Ambulatory Visit: Payer: Self-pay | Admitting: Pharmacist

## 2019-04-11 DIAGNOSIS — I10 Essential (primary) hypertension: Secondary | ICD-10-CM

## 2019-04-11 DIAGNOSIS — J449 Chronic obstructive pulmonary disease, unspecified: Secondary | ICD-10-CM

## 2019-04-11 DIAGNOSIS — F331 Major depressive disorder, recurrent, moderate: Secondary | ICD-10-CM

## 2019-04-11 DIAGNOSIS — F419 Anxiety disorder, unspecified: Secondary | ICD-10-CM

## 2019-04-11 DIAGNOSIS — I890 Lymphedema, not elsewhere classified: Secondary | ICD-10-CM | POA: Diagnosis not present

## 2019-04-11 NOTE — Chronic Care Management (AMB) (Signed)
Chronic Care Management   Follow Up Note   04/11/2019 Name: Cheryl Chandler MRN: [id number] DOB: [birth year]  Referred by: Cheryl Nearing, PA-C Reason for referral : Chronic Care Management (Medication Management)   Cheryl Chandler is a 53 y.o. year old female who is a primary care patient of Cheryl Nearing, PA-C. The CCM team was consulted for assistance with chronic disease management and care coordination needs.    Received multiple calls from patient today with concerns.   Review of patient status, including review of consultants reports, relevant laboratory and other test results, and collaboration with appropriate care team members and the patient's provider was performed as part of comprehensive patient evaluation and provision of chronic care management services.    SDOH (Social Determinants of Health) screening performed today: Housing . See Care Plan for related entries.   Outpatient Encounter Medications as of 04/11/2019  Medication Sig Note  . albuterol (PROVENTIL) (2.5 MG/3ML) 0.083% nebulizer solution INHALE CONTENTS OF 1 VIAL IN NEBULIZER EVERY 4 HOURS IF NEEDED FOR WHEEZING OR SHORTNESS OF BREATH. 10/11/2018: Four times daily   . citalopram (CELEXA) 20 MG tablet TAKE 1 TABLET BY MOUTH ONCE DAILY.   . DULERA 100-5 MCG/ACT AERO INHALE 2 PUFFS BY MOUTH TWICE A DAY. RINSE MOUTH AFTER USE.   [name] EPINEPHrine 0.3 mg/0.3 mL IJ SOAJ injection USE AS DIRECTED.   . furosemide (LASIX) 40 MG tablet TAKE 1 TABLET BY MOUTH ONCE DAILY   . gabapentin (NEURONTIN) 300 MG capsule TAKE 1 CAPSULE BY MOUTH 3 TIMES DAILY ASNEEDED   . levothyroxine (SYNTHROID) 50 MCG tablet TAKE 1 TABLET BY MOUTH ONCE DAILY ON AN EMPTY STOMACH. WAIT 30 MINUTES BEFORE TAKING OTHER MEDS.   [name] lisinopril (ZESTRIL) 30 MG tablet TAKE 1 TABLET BY MOUTH ONCE DAILY   . meloxicam (MOBIC) 15 MG tablet TAKE 1 TABLET BY MOUTH ONCE A DAY WITH FOOD OR MILK IF NEEDE 10/11/2018: QAM  . methocarbamol  (ROBAXIN) 750 MG tablet TAKE 1-2 TABLETS BY MOUTH 3 TIMES DAILY IF NEEDED FOR BACK PAIN.   . montelukast (SINGULAIR) 10 MG tablet TAKE 1 TABLET BY MOUTH ONCE DAILY   . nitroGLYCERIN (NITROSTAT) 0.4 MG SL tablet DISSOLVE 1 TABLET UNDER TONGUE AT ONSET OF CHEST PAIN. REPEAT IN 5 MIN IF NOT RESOLVED, MAX 3 DOSES. 911 IF NEEDED.   [name] omeprazole (PRILOSEC) 20 MG capsule Take 1 capsule (20 mg total) by mouth daily.   . simvastatin (ZOCOR) 40 MG tablet TAKE 1 TABLET BY MOUTH ONCE DAILY   . tiotropium (SPIRIVA HANDIHALER) 18 MCG inhalation capsule Place 1 capsule (18 mcg total) into inhaler and inhale daily.   . traMADol (ULTRAM) 50 MG tablet TAKE 1 TABLET BY MOUTH EVERY 8 HOURS AS NEEDED PAIN 10/11/2018: QAM  . triamcinolone cream (KENALOG) 0.1 % Apply 1 application topically 2 (two) times daily.   . Vitamin D, Ergocalciferol, (DRISDOL) 1.25 MG (50000 UT) CAPS capsule Take 1 capsule (50,000 Units total) by mouth every 7 (seven) days.    No facility-administered encounter medications on file as of 04/11/2019.     Goals Addressed            This Visit's Progress     Patient Stated   . PharmD "I have depression, anxiety, and chronic pain" (pt-stated)       Current Barriers:  . Polypharmacy; complex patient with multiple comorbidities including HTN, venous insufficiency, COPD, peripheral neuropathy, depression/anxiety, arthritis.  Cheryl Chandler CCM team multiple times to report that  her husband has been admitted for CVA. He provides physical care for her.  o HH RN coming today from 4 -7 pm, plans on asking her to help bathe.  o She plans to try and get approved for more hours from [facility], given this emergent situation o Wonders about getting Depends from Medicaid  . ecialists that she had missed- Cardiology, Pain. Notes that she has these phone numbers at home with her.  . Self-manages medications by using a pill box o HTN/ASCVD risk reduction: lisinopril 30 mg daily; simvastatin 40 mg daily; needs to r/s w/  cardiology  o Venous insufficiency: furosemide 40 mg daily; notes more pain in her legs recently.  o COPD/Asthma: montelukast 10 mg daily; Spiriva 18 mcg daily;  o Peripheral neuropathy/pain: gabapentin 300 mg, meloxicam 15 mg, methocarbamol 750 mg daily, tramadol 50 mg daily; needs to r/s with pain management o Depression/anxiety: citalopram 20 mg daily  o Hypothyroid: levothyroxine 50 mcg o GERD: omeprazole 20 mg daily  Pharmacist Clinical Goal(s):  [name] Over the next 90 days, patient will work with PharmD and provider towards optimized medication management  Interventions: . Encouraged patient to communicate w/ [facility] agency about increased hours.  . Will collaborate w/ CCM team for above questions   Patient Self Care Activities:  . Patient will take medications as prescribed . Patient will schedule f/u with specialty providers and attend these appointments  Please see past updates related to this goal by clicking on the "Past Updates" button in the selected goal          Plan: - Will collaborate w/ CCM team to support patient - Will outreach as previously scheduled for medication mgmt f/u   Cheryl Chandler, PharmD, [place] [phone]

## 2019-04-11 NOTE — Patient Instructions (Signed)
Visit Information  Goals Addressed            This Visit's Progress     Patient Stated   . PharmD "I have depression, anxiety, and chronic pain" (pt-stated)       Current Barriers:  . Polypharmacy; complex patient with multiple comorbidities including HTN, venous insufficiency, COPD, peripheral neuropathy, depression/anxiety, arthritis.  Hulen Skains CCM team multiple times to report that her husband has been admitted for CVA. He provides physical care for her.  o HH RN coming today from 4 -7 pm, plans on asking her to help bathe.  o She plans to try and get approved for more hours from [facility], given this emergent situation o Wonders about getting Depends from Medicaid  . ecialists that she had missed- Cardiology, Pain. Notes that she has these phone numbers at home with her.  . Self-manages medications by using a pill box o HTN/ASCVD risk reduction: lisinopril 30 mg daily; simvastatin 40 mg daily; needs to r/s w/ cardiology  o Venous insufficiency: furosemide 40 mg daily; notes more pain in her legs recently.  o COPD/Asthma: montelukast 10 mg daily; Spiriva 18 mcg daily;  o Peripheral neuropathy/pain: gabapentin 300 mg, meloxicam 15 mg, methocarbamol 750 mg daily, tramadol 50 mg daily; needs to r/s with pain management o Depression/anxiety: citalopram 20 mg daily  o Hypothyroid: levothyroxine 50 mcg o GERD: omeprazole 20 mg daily  Pharmacist Clinical Goal(s):  [name] Over the next 90 days, patient will work with PharmD and provider towards optimized medication management  Interventions: . Encouraged patient to communicate w/ [facility] agency about increased hours.  . Will collaborate w/ CCM team for above questions   Patient Self Care Activities:  . Patient will take medications as prescribed . Patient will schedule f/u with specialty providers and attend these appointments  Please see past updates related to this goal by clicking on the "Past Updates" button in the selected goal         The  patient verbalized understanding of instructions provided today and declined a print copy of patient instruction materials.    Plan: - Will collaborate w/ CCM team to support patient - Will outreach as previously scheduled for medication mgmt f/u   [name], PharmD, [place] [phone]

## 2019-04-14 ENCOUNTER — Telehealth: Payer: Self-pay

## 2019-04-14 ENCOUNTER — Ambulatory Visit: Payer: Self-pay | Admitting: *Deleted

## 2019-04-14 ENCOUNTER — Ambulatory Visit: Payer: Self-pay | Admitting: Licensed Clinical Social Worker

## 2019-04-14 DIAGNOSIS — I10 Essential (primary) hypertension: Secondary | ICD-10-CM

## 2019-04-14 DIAGNOSIS — J449 Chronic obstructive pulmonary disease, unspecified: Secondary | ICD-10-CM

## 2019-04-14 NOTE — Chronic Care Management (AMB) (Signed)
  Care Management   Follow Up Note   04/14/2019 Name: Cheryl Chandler MRN: [id number] DOB: [birth year]  Referred by: [name], PA-C Reason for referral : Care Coordination   Story Cheryl Chandler is a 53 y.o. year old female who is a primary care patient of [name], [place]. The care management team was consulted for assistance with care management and care coordination needs.    Review of patient status, including review of consultants reports, relevant laboratory and other test results, and collaboration with appropriate care team members and the patient's provider was performed as part of comprehensive patient evaluation and provision of chronic care management services.    CCM LCSW received incoming call from Cheryl Chandler and discussed patient's urgent need for increased PCS hours now that spouse will have to go to SNF. LCSW sent email to PCP with Change of Medical Status request.   The care management team will reach out to the patient again over the next 30 days.   Cheryl Chandler, BSW, MSW, [facility] [email or web address] Phone: [phone]

## 2019-04-14 NOTE — Chronic Care Management (AMB) (Signed)
Chronic Care Management   Follow Up Note   04/14/2019 Name: Chloe Bluett MRN: [id number] DOB: Feb 04, 1966  Referred by: [name], PA-C Reason for referral : Chronic Care Management (Stress/Anxiety )   Cherron Librada Castronovo is a 53 y.o. year old female who is a primary care patient of [name], PA-C. The CCM team was consulted for assistance with chronic disease management and care coordination needs.    Review of patient status, including review of consultants reports, relevant laboratory and other test results, and collaboration with appropriate care team members and the patient's provider was performed as part of comprehensive patient evaluation and provision of chronic care management services.    SDOH (Social Determinants of Health) screening performed today: Air cabin crew Strain  Physical Activity. See Care Plan for related entries.   Outpatient Encounter Medications as of 04/14/2019  Medication Sig Note  . albuterol (PROVENTIL) (2.5 MG/3ML) 0.083% nebulizer solution INHALE CONTENTS OF 1 VIAL IN NEBULIZER EVERY 4 HOURS IF NEEDED FOR WHEEZING OR SHORTNESS OF BREATH. 10/11/2018: Four times daily   . citalopram (CELEXA) 20 MG tablet TAKE 1 TABLET BY MOUTH ONCE DAILY.   . DULERA 100-5 MCG/ACT AERO INHALE 2 PUFFS BY MOUTH TWICE A DAY. RINSE MOUTH AFTER USE.   [name] EPINEPHrine 0.3 mg/0.3 mL IJ SOAJ injection USE AS DIRECTED.   . furosemide (LASIX) 40 MG tablet TAKE 1 TABLET BY MOUTH ONCE DAILY   . gabapentin (NEURONTIN) 300 MG capsule TAKE 1 CAPSULE BY MOUTH 3 TIMES DAILY ASNEEDED   . levothyroxine (SYNTHROID) 50 MCG tablet TAKE 1 TABLET BY MOUTH ONCE DAILY ON AN EMPTY STOMACH. WAIT 30 MINUTES BEFORE TAKING OTHER MEDS.   [name] lisinopril (ZESTRIL) 30 MG tablet TAKE 1 TABLET BY MOUTH ONCE DAILY   . meloxicam (MOBIC) 15 MG tablet TAKE 1 TABLET BY MOUTH ONCE A DAY WITH FOOD OR MILK IF NEEDE 10/11/2018: QAM  . methocarbamol (ROBAXIN) 750 MG tablet TAKE 1-2  TABLETS BY MOUTH 3 TIMES DAILY IF NEEDED FOR BACK PAIN.   . montelukast (SINGULAIR) 10 MG tablet TAKE 1 TABLET BY MOUTH ONCE DAILY   . nitroGLYCERIN (NITROSTAT) 0.4 MG SL tablet DISSOLVE 1 TABLET UNDER TONGUE AT ONSET OF CHEST PAIN. REPEAT IN 5 MIN IF NOT RESOLVED, MAX 3 DOSES. 911 IF NEEDED.   [name] omeprazole (PRILOSEC) 20 MG capsule Take 1 capsule (20 mg total) by mouth daily.   . simvastatin (ZOCOR) 40 MG tablet TAKE 1 TABLET BY MOUTH ONCE DAILY   . tiotropium (SPIRIVA HANDIHALER) 18 MCG inhalation capsule Place 1 capsule (18 mcg total) into inhaler and inhale daily.   . traMADol (ULTRAM) 50 MG tablet TAKE 1 TABLET BY MOUTH EVERY 8 HOURS AS NEEDED PAIN 10/11/2018: QAM  . triamcinolone cream (KENALOG) 0.1 % Apply 1 application topically 2 (two) times daily.   . Vitamin D, Ergocalciferol, (DRISDOL) 1.25 MG (50000 UT) CAPS capsule Take 1 capsule (50,000 Units total) by mouth every 7 (seven) days.    No facility-administered encounter medications on file as of 04/14/2019.     Goals Addressed            This Visit's Progress   . RN- I need more help in the home (pt-stated)       Current Barriers:  [name] Knowledge Deficits related to managing multiple chronic diseases  . Lacks caregiver support.  . Film/video editor.  . Transportation barriers  Nurse Case Manager Clinical Goal(s):  [name] Over the next 90 days, patient will work  with RNCM  to address needs related to Multiple chronic Disease Processes . Over the next 90 days, patient will work with [facility]  (community agency) to Increase Strength and regain some independence   Interventions:  . Collaborated with CCM Social Worker  regarding Patient's need for increased hours of PCS because spouse who was her main caregiver is in the hospital and has had a debilitating stroke.   [name] Discussed plans with patient for ongoing care management follow up and provided patient with direct contact information for care management team  . Spoke  with patient she is very upset about her spouse Will being in the hospital. She states he has had a stroke affecting his right side including his speech and mobility.   Patient Self Care Activities:   . Currently UNABLE TO independently perform ADLs or IADLs   Please see past updates related to this goal by clicking on the "Past Updates" button in the selected goal          The care management team will reach out to the patient again over the next 30 days.  The patient has been provided with contact information for the care management team and has been advised to call with any health related questions or concerns.   Ma Rings Royalty Domagala RN, BSN Nurse Case Education officer, [facility]  [phone]) Business Mobile

## 2019-04-19 ENCOUNTER — Other Ambulatory Visit: Payer: Self-pay | Admitting: Family Medicine

## 2019-04-19 DIAGNOSIS — R0609 Other forms of dyspnea: Secondary | ICD-10-CM

## 2019-04-28 DIAGNOSIS — J449 Chronic obstructive pulmonary disease, unspecified: Secondary | ICD-10-CM | POA: Diagnosis not present

## 2019-05-03 ENCOUNTER — Ambulatory Visit (INDEPENDENT_AMBULATORY_CARE_PROVIDER_SITE_OTHER): Payer: Medicaid Other | Admitting: Family Medicine

## 2019-05-03 ENCOUNTER — Other Ambulatory Visit: Payer: Self-pay

## 2019-05-03 ENCOUNTER — Encounter: Payer: Self-pay | Admitting: Family Medicine

## 2019-05-03 VITALS — BP 158/71 | HR 68 | Temp 98.2°F | Ht 64.0 in | Wt 329.0 lb

## 2019-05-03 DIAGNOSIS — G629 Polyneuropathy, unspecified: Secondary | ICD-10-CM | POA: Diagnosis not present

## 2019-05-03 DIAGNOSIS — I890 Lymphedema, not elsewhere classified: Secondary | ICD-10-CM | POA: Diagnosis not present

## 2019-05-03 DIAGNOSIS — J449 Chronic obstructive pulmonary disease, unspecified: Secondary | ICD-10-CM | POA: Diagnosis not present

## 2019-05-03 DIAGNOSIS — I10 Essential (primary) hypertension: Secondary | ICD-10-CM | POA: Diagnosis not present

## 2019-05-03 DIAGNOSIS — J4540 Moderate persistent asthma, uncomplicated: Secondary | ICD-10-CM

## 2019-05-03 DIAGNOSIS — E785 Hyperlipidemia, unspecified: Secondary | ICD-10-CM

## 2019-05-03 DIAGNOSIS — K219 Gastro-esophageal reflux disease without esophagitis: Secondary | ICD-10-CM | POA: Diagnosis not present

## 2019-05-03 DIAGNOSIS — I872 Venous insufficiency (chronic) (peripheral): Secondary | ICD-10-CM | POA: Diagnosis not present

## 2019-05-03 DIAGNOSIS — E039 Hypothyroidism, unspecified: Secondary | ICD-10-CM

## 2019-05-03 DIAGNOSIS — R05 Cough: Secondary | ICD-10-CM

## 2019-05-03 DIAGNOSIS — F419 Anxiety disorder, unspecified: Secondary | ICD-10-CM | POA: Diagnosis not present

## 2019-05-03 DIAGNOSIS — J301 Allergic rhinitis due to pollen: Secondary | ICD-10-CM

## 2019-05-03 DIAGNOSIS — R7301 Impaired fasting glucose: Secondary | ICD-10-CM | POA: Diagnosis not present

## 2019-05-03 DIAGNOSIS — E559 Vitamin D deficiency, unspecified: Secondary | ICD-10-CM | POA: Diagnosis not present

## 2019-05-03 DIAGNOSIS — R059 Cough, unspecified: Secondary | ICD-10-CM

## 2019-05-03 DIAGNOSIS — R0602 Shortness of breath: Secondary | ICD-10-CM

## 2019-05-03 DIAGNOSIS — M62838 Other muscle spasm: Secondary | ICD-10-CM

## 2019-05-03 MED ORDER — GABAPENTIN 300 MG PO CAPS: 300.0000 mg | ORAL_CAPSULE | Freq: Three times a day (TID) | ORAL | 1 refills | Status: DC | PRN

## 2019-05-03 MED ORDER — NITROGLYCERIN 0.4 MG SL SUBL: SUBLINGUAL_TABLET | SUBLINGUAL | 2 refills | Status: DC

## 2019-05-03 MED ORDER — MELOXICAM 15 MG PO TABS: ORAL_TABLET | ORAL | 3 refills | Status: DC

## 2019-05-03 MED ORDER — FUROSEMIDE 40 MG PO TABS: 40.0000 mg | ORAL_TABLET | Freq: Every day | ORAL | 1 refills | Status: DC

## 2019-05-03 MED ORDER — LISINOPRIL 40 MG PO TABS: 40.0000 mg | ORAL_TABLET | Freq: Every day | ORAL | 1 refills | Status: DC

## 2019-05-03 MED ORDER — LEVOTHYROXINE SODIUM 50 MCG PO TABS: 50.0000 ug | ORAL_TABLET | Freq: Every day | ORAL | 1 refills | Status: DC

## 2019-05-03 MED ORDER — CITALOPRAM HYDROBROMIDE 20 MG PO TABS: 20.0000 mg | ORAL_TABLET | Freq: Every day | ORAL | 1 refills | Status: DC

## 2019-05-03 MED ORDER — VITAMIN D (ERGOCALCIFEROL) 1.25 MG (50000 UNIT) PO CAPS: 50000.0000 [IU] | ORAL_CAPSULE | ORAL | 1 refills | Status: DC

## 2019-05-03 MED ORDER — DULERA 100-5 MCG/ACT IN AERO: INHALATION_SPRAY | RESPIRATORY_TRACT | 5 refills | Status: DC

## 2019-05-03 MED ORDER — SIMVASTATIN 40 MG PO TABS: 40.0000 mg | ORAL_TABLET | Freq: Every day | ORAL | 1 refills | Status: DC

## 2019-05-03 MED ORDER — METHOCARBAMOL 750 MG PO TABS: ORAL_TABLET | ORAL | 0 refills | Status: DC

## 2019-05-03 MED ORDER — ALBUTEROL SULFATE (2.5 MG/3ML) 0.083% IN NEBU: INHALATION_SOLUTION | RESPIRATORY_TRACT | 3 refills | Status: DC

## 2019-05-03 MED ORDER — MONTELUKAST SODIUM 10 MG PO TABS: 10.0000 mg | ORAL_TABLET | Freq: Every day | ORAL | 1 refills | Status: DC

## 2019-05-03 NOTE — Progress Notes (Signed)
BP (!) 158/71   Pulse 68   Temp 98.2 F (36.8 C) (Oral)   Ht 5\' 4"  (1.626 m)   Wt (!) 329 lb (149.2 kg)   SpO2 97%   BMI 56.47 kg/m    Subjective:    Patient ID: Cheryl Chandler, female    DOB: 1966/02/16, 54 y.o.   MRN: [id number]  HPI: Cheryl Chandler is a 54 y.o. female  Chief Complaint  Patient presents with  . Hypertension  . Hypothyroidism  . Medication Refill    robaxin  . Hyperlipidemia   Patient presenting today for 6 month f/u chronic conditions.   Back and legs still hurting badly from arthritis and neuropathy. No longer on tramadol, currently on meloxicam and gabapentin which does help some. Ambulating minimally at this point, typically using wheelchair.   Still using her lymphedema pump and exercises, followed by Vascular specialist. Feels things have improved significantly since starting with these.   Awaiting getting established with Cardiology, missed appt due to husband's hospitalization the last few weeks.Home BPs have been 150-180/70s. Having to take her nitro a few times per week on average for her angina. Typically rest helps with her sxs quite a bit.   HLD - on simvastatin without side effects. Not exercising or watching diet closely.   IFG - diet controlled, not following strict diet but trying to do better. Unable to exercise as wheelchair bound for the most part.   Having to wear depends for her overactive bladder. Coughing, sneezing, straining to get up or down causes urine accidents. Wanting an order placed for these as the cost is difficult for her to manage. Has never been on medications for this but does not wish to be.   Depression - moods doing fairly well on celexa, stresses have been high due to husband's recent CVA and inpatient rehab stay for this as well as financial issues. Denies SI/HI.   COPD and asthma - Breathing stable with albuterol nebulizer, dulera and spiriva regimen. No wheezing, recent exacerbations.   Relevant  past medical, surgical, family and social history reviewed and updated as indicated. Interim medical history since our last visit reviewed. Allergies and medications reviewed and updated.  Review of Systems  Per HPI unless specifically indicated above     Objective:    BP (!) 158/71   Pulse 68   Temp 98.2 F (36.8 C) (Oral)   Ht 5\' 4"  (1.626 m)   Wt (!) 329 lb (149.2 kg)   SpO2 97%   BMI 56.47 kg/m   Wt Readings from Last 3 Encounters:  05/03/19 (!) 329 lb (149.2 kg)  02/22/19 (!) 335 lb (152 kg)  11/07/18 290 lb (131.5 kg)    Physical Exam Vitals and nursing note reviewed.  Constitutional:      Appearance: Normal appearance. She is obese. She is not ill-appearing.  HENT:     Head: Atraumatic.  Eyes:     Extraocular Movements: Extraocular movements intact.     Conjunctiva/sclera: Conjunctivae normal.  Cardiovascular:     Rate and Rhythm: Normal rate and regular rhythm.     Heart sounds: Normal heart sounds.  Pulmonary:     Effort: Pulmonary effort is normal.     Breath sounds: Normal breath sounds.  Musculoskeletal:        General: Swelling (chronic LE edema b/l, improved from previous) present.     Cervical back: Normal range of motion and neck supple.     Comments: In wheelchair  Skin:    General: Skin is warm and dry.  Neurological:     Mental Status: She is alert and oriented to person, place, and time.  Psychiatric:        Mood and Affect: Mood normal.        Thought Content: Thought content normal.        Judgment: Judgment normal.     Results for orders placed or performed in visit on 05/03/19  Comprehensive metabolic panel  Result Value Ref Range   Glucose 85 65 - 99 mg/dL   BUN 12 6 - 24 mg/dL   Creatinine, Ser 5.05 0.57 - 1.00 mg/dL   GFR calc non Af Amer 93 >59 mL/min/1.73   GFR calc Af Amer 108 >59 mL/min/1.73   BUN/Creatinine Ratio 16 9 - 23   Sodium 140 134 - 144 mmol/L   Potassium 4.4 3.5 - 5.2 mmol/L   Chloride 103 96 - 106 mmol/L    CO2 24 20 - 29 mmol/L   Calcium 8.2 (L) 8.7 - 10.2 mg/dL   Total Protein 6.5 6.0 - 8.5 g/dL   Albumin 3.5 (L) 3.8 - 4.9 g/dL   Globulin, Total 3.0 1.5 - 4.5 g/dL   Albumin/Globulin Ratio 1.2 1.2 - 2.2   Bilirubin Total 0.4 0.0 - 1.2 mg/dL   Alkaline Phosphatase 72 39 - 117 IU/L   AST 19 0 - 40 IU/L   ALT 29 0 - 32 IU/L  CBC with Differential/Platelet out  Result Value Ref Range   WBC 8.2 3.4 - 10.8 x10E3/uL   RBC 4.55 3.77 - 5.28 x10E6/uL   Hemoglobin 12.9 11.1 - 15.9 g/dL   Hematocrit 39.7 67.3 - 46.6 %   MCV 91 79 - 97 fL   MCH 28.4 26.6 - 33.0 pg   MCHC 31.0 (L) 31.5 - 35.7 g/dL   RDW 41.9 37.9 - 02.4 %   Platelets 372 150 - 450 x10E3/uL   Neutrophils 64 Not Estab. %   Lymphs 26 Not Estab. %   Monocytes 6 Not Estab. %   Eos 3 Not Estab. %   Basos 1 Not Estab. %   Neutrophils Absolute 5.3 1.4 - 7.0 x10E3/uL   Lymphocytes Absolute 2.1 0.7 - 3.1 x10E3/uL   Monocytes Absolute 0.5 0.1 - 0.9 x10E3/uL   EOS (ABSOLUTE) 0.2 0.0 - 0.4 x10E3/uL   Basophils Absolute 0.1 0.0 - 0.2 x10E3/uL   Immature Granulocytes 0 Not Estab. %   Immature Grans (Abs) 0.0 0.0 - 0.1 x10E3/uL  Lipid Panel w/o Chol/HDL Ratio out  Result Value Ref Range   Cholesterol, Total 152 100 - 199 mg/dL   Triglycerides 88 0 - 149 mg/dL   HDL 41 >09 mg/dL   VLDL Cholesterol Cal 17 5 - 40 mg/dL   LDL Chol Calc (NIH) 94 0 - 99 mg/dL  TSH  Result Value Ref Range   TSH 2.740 0.450 - 4.500 uIU/mL  Vit D  25 hydroxy (rtn osteoporosis monitoring)  Result Value Ref Range   Vit D, 25-Hydroxy 21.1 (L) 30.0 - 100.0 ng/mL  HgB A1c  Result Value Ref Range   Hgb A1c MFr Bld 5.7 (H) 4.8 - 5.6 %   Est. average glucose Bld gHb Est-mCnc 117 mg/dL      Assessment & Plan:   Problem List Items Addressed This Visit      Cardiovascular and Mediastinum   Essential hypertension - Primary    Persistently elevated, increase lisinopril to 40 mg and continue to  monitor for improvement. Continue remainder of regimen. To establish  with Cardiology in near future      Relevant Medications   lisinopril (ZESTRIL) 40 MG tablet   furosemide (LASIX) 40 MG tablet   nitroGLYCERIN (NITROSTAT) 0.4 MG SL tablet   simvastatin (ZOCOR) 40 MG tablet   Other Relevant Orders   Comprehensive metabolic panel (Completed)   CBC with Differential/Platelet out (Completed)   Chronic venous insufficiency    Followed by Vascular Surgery. Continue per their recommendations      Relevant Medications   lisinopril (ZESTRIL) 40 MG tablet   furosemide (LASIX) 40 MG tablet   nitroGLYCERIN (NITROSTAT) 0.4 MG SL tablet   simvastatin (ZOCOR) 40 MG tablet     Respiratory   Asthma    Stable, continue inhaler regimen      Relevant Medications   albuterol (PROVENTIL) (2.5 MG/3ML) 0.083% nebulizer solution   mometasone-formoterol (DULERA) 100-5 MCG/ACT AERO   montelukast (SINGULAIR) 10 MG tablet   COPD (chronic obstructive pulmonary disease) (HCC)    Stable and well controlled, continue current inhaler regimen      Relevant Medications   albuterol (PROVENTIL) (2.5 MG/3ML) 0.083% nebulizer solution   mometasone-formoterol (DULERA) 100-5 MCG/ACT AERO   montelukast (SINGULAIR) 10 MG tablet     Digestive   Esophageal reflux    Stable and under good control, continue current regimen        Endocrine   Adult hypothyroidism    Recheck TSH, continue current regimen      Relevant Medications   levothyroxine (SYNTHROID) 50 MCG tablet   Other Relevant Orders   TSH (Completed)   IFG (impaired fasting glucose)    Will recheck A1C, continue working on diet and exercise changes      Relevant Orders   HgB A1c (Completed)     Nervous and Auditory   Peripheral neuropathy    Stable, continue current regimen      Relevant Medications   citalopram (CELEXA) 20 MG tablet   gabapentin (NEURONTIN) 300 MG capsule   methocarbamol (ROBAXIN) 750 MG tablet     Other   Anxiety    Stable and under good control, continue celexa      Relevant  Medications   citalopram (CELEXA) 20 MG tablet   HLD (hyperlipidemia)    Recheck lipids, adjust as needed. Work on lifestyle modifications      Relevant Medications   lisinopril (ZESTRIL) 40 MG tablet   furosemide (LASIX) 40 MG tablet   nitroGLYCERIN (NITROSTAT) 0.4 MG SL tablet   simvastatin (ZOCOR) 40 MG tablet   Other Relevant Orders   Lipid Panel w/o Chol/HDL Ratio out (Completed)   Vitamin D deficiency    Recheck levels, continue working on dietary and environmental intake in addition to supplemental dosing      Relevant Orders   Vit D  25 hydroxy (rtn osteoporosis monitoring) (Completed)   Lymphedema    Followed by Vascular Specialist, using lymphedema pump and massage/exercises which have improved condition significantly. Continue per their recommendations       Other Visit Diagnoses    Cough       Relevant Medications   albuterol (PROVENTIL) (2.5 MG/3ML) 0.083% nebulizer solution   Shortness of breath       Relevant Medications   albuterol (PROVENTIL) (2.5 MG/3ML) 0.083% nebulizer solution   Hypothyroidism, unspecified type       Relevant Medications   levothyroxine (SYNTHROID) 50 MCG tablet   Muscle spasm  Relevant Medications   methocarbamol (ROBAXIN) 750 MG tablet   Chronic allergic rhinitis due to pollen       Relevant Medications   montelukast (SINGULAIR) 10 MG tablet      50 minutes spent today in care and counseling with patient  Follow up plan: Return in about 4 weeks (around 05/31/2019) for BP.

## 2019-05-04 LAB — COMPREHENSIVE METABOLIC PANEL
ALT: 29 IU/L (ref 0–32)
AST: 19 IU/L (ref 0–40)
Albumin/Globulin Ratio: 1.2 (ref 1.2–2.2)
Albumin: 3.5 g/dL — ABNORMAL LOW (ref 3.8–4.9)
Alkaline Phosphatase: 72 IU/L (ref 39–117)
BUN/Creatinine Ratio: 16 (ref 9–23)
BUN: 12 mg/dL (ref 6–24)
Bilirubin Total: 0.4 mg/dL (ref 0.0–1.2)
CO2: 24 mmol/L (ref 20–29)
Calcium: 8.2 mg/dL — ABNORMAL LOW (ref 8.7–10.2)
Chloride: 103 mmol/L (ref 96–106)
Creatinine, Ser: 0.74 mg/dL (ref 0.57–1.00)
GFR calc Af Amer: 108 mL/min/{1.73_m2} (ref >59)
GFR calc non Af Amer: 93 mL/min/{1.73_m2} (ref >59)
Globulin, Total: 3 g/dL (ref 1.5–4.5)
Glucose: 85 mg/dL (ref 65–99)
Potassium: 4.4 mmol/L (ref 3.5–5.2)
Sodium: 140 mmol/L (ref 134–144)
Total Protein: 6.5 g/dL (ref 6.0–8.5)

## 2019-05-04 LAB — LIPID PANEL W/O CHOL/HDL RATIO
Cholesterol, Total: 152 mg/dL (ref 100–199)
HDL: 41 mg/dL (ref >39)
LDL Chol Calc (NIH): 94 mg/dL (ref 0–99)
Triglycerides: 88 mg/dL (ref 0–149)
VLDL Cholesterol Cal: 17 mg/dL (ref 5–40)

## 2019-05-04 LAB — CBC WITH DIFFERENTIAL/PLATELET
Basophils Absolute: 0.1 10*3/uL (ref 0.0–0.2)
Basos: 1 %
EOS (ABSOLUTE): 0.2 10*3/uL (ref 0.0–0.4)
Eos: 3 %
Hematocrit: 41.6 % (ref 34.0–46.6)
Hemoglobin: 12.9 g/dL (ref 11.1–15.9)
Immature Grans (Abs): 0 10*3/uL (ref 0.0–0.1)
Immature Granulocytes: 0 %
Lymphocytes Absolute: 2.1 10*3/uL (ref 0.7–3.1)
Lymphs: 26 %
MCH: 28.4 pg (ref 26.6–33.0)
MCHC: 31 g/dL — ABNORMAL LOW (ref 31.5–35.7)
MCV: 91 fL (ref 79–97)
Monocytes Absolute: 0.5 10*3/uL (ref 0.1–0.9)
Monocytes: 6 %
Neutrophils Absolute: 5.3 10*3/uL (ref 1.4–7.0)
Neutrophils: 64 %
Platelets: 372 10*3/uL (ref 150–450)
RBC: 4.55 x10E6/uL (ref 3.77–5.28)
RDW: 13.4 % (ref 11.7–15.4)
WBC: 8.2 10*3/uL (ref 3.4–10.8)

## 2019-05-04 LAB — VITAMIN D 25 HYDROXY (VIT D DEFICIENCY, FRACTURES): Vit D, 25-Hydroxy: 21.1 ng/mL — ABNORMAL LOW (ref 30.0–100.0)

## 2019-05-04 LAB — TSH: TSH: 2.74 u[IU]/mL (ref 0.450–4.500)

## 2019-05-04 LAB — HEMOGLOBIN A1C
Est. average glucose Bld gHb Est-mCnc: 117 mg/dL
Hgb A1c MFr Bld: 5.7 % — ABNORMAL HIGH (ref 4.8–5.6)

## 2019-05-05 ENCOUNTER — Telehealth: Payer: Self-pay | Admitting: Family Medicine

## 2019-05-05 DIAGNOSIS — N6012 Diffuse cystic mastopathy of left breast: Secondary | ICD-10-CM

## 2019-05-05 DIAGNOSIS — N6011 Diffuse cystic mastopathy of right breast: Secondary | ICD-10-CM

## 2019-05-05 NOTE — Telephone Encounter (Signed)
Lab result note generated, and problem list reviewed - already has COPD and asthma listed. Does she mean fibrocystic breasts, ovaries, or what is she specifically referring to there?   Copied from CRM [id number]. Topic: General - Other >> May 05, 2019  9:03 AM Gwenlyn Fudge wrote: Reason for CRM: Pt called and is requesting to have her lab results from 05/03/19. Pt also states she would like her record to reflect she has copd, asthma, and fibrositic disease .

## 2019-05-05 NOTE — Telephone Encounter (Signed)
Spoke with patient, she states that she has fibrocystic breast disease, asthma and emphysema .

## 2019-05-07 DIAGNOSIS — N6012 Diffuse cystic mastopathy of left breast: Secondary | ICD-10-CM | POA: Insufficient documentation

## 2019-05-07 DIAGNOSIS — N6011 Diffuse cystic mastopathy of right breast: Secondary | ICD-10-CM | POA: Insufficient documentation

## 2019-05-07 NOTE — Assessment & Plan Note (Signed)
Stable and under good control, continue celexa

## 2019-05-07 NOTE — Assessment & Plan Note (Signed)
Recheck levels, continue working on dietary and environmental intake in addition to supplemental dosing

## 2019-05-07 NOTE — Telephone Encounter (Signed)
Breast disease added

## 2019-05-07 NOTE — Assessment & Plan Note (Signed)
Stable, continue current regimen 

## 2019-05-07 NOTE — Assessment & Plan Note (Signed)
Stable and well controlled, continue current inhaler regimen 

## 2019-05-07 NOTE — Assessment & Plan Note (Signed)
Stable, continue inhaler regimen 

## 2019-05-07 NOTE — Assessment & Plan Note (Signed)
Persistently elevated, increase lisinopril to 40 mg and continue to monitor for improvement. Continue remainder of regimen. To establish with Cardiology in near future

## 2019-05-07 NOTE — Assessment & Plan Note (Signed)
Recheck lipids, adjust as needed. Work on lifestyle modifications 

## 2019-05-07 NOTE — Assessment & Plan Note (Signed)
Followed by Vascular Surgery. Continue per their recommendations

## 2019-05-07 NOTE — Assessment & Plan Note (Signed)
Stable and under good control, continue current regimen 

## 2019-05-07 NOTE — Assessment & Plan Note (Signed)
Will recheck A1C, continue working on diet and exercise changes

## 2019-05-07 NOTE — Assessment & Plan Note (Signed)
Recheck TSH, continue current regimen 

## 2019-05-07 NOTE — Assessment & Plan Note (Signed)
Followed by Vascular Specialist, using lymphedema pump and massage/exercises which have improved condition significantly. Continue per their recommendations

## 2019-05-16 ENCOUNTER — Ambulatory Visit: Payer: Self-pay | Admitting: Pharmacist

## 2019-05-16 DIAGNOSIS — F331 Major depressive disorder, recurrent, moderate: Secondary | ICD-10-CM

## 2019-05-16 DIAGNOSIS — I890 Lymphedema, not elsewhere classified: Secondary | ICD-10-CM

## 2019-05-16 DIAGNOSIS — J4540 Moderate persistent asthma, uncomplicated: Secondary | ICD-10-CM

## 2019-05-16 DIAGNOSIS — I10 Essential (primary) hypertension: Secondary | ICD-10-CM

## 2019-05-16 NOTE — Patient Instructions (Signed)
Visit Information  Goals Addressed            This Visit's Progress     Patient Stated   . PharmD "I have depression, anxiety, and chronic pain" (pt-stated)       Current Barriers:  . Polypharmacy; complex patient with multiple comorbidities including HTN, venous insufficiency, COPD, peripheral neuropathy, depression/anxiety, arthritis.  o Notes today, struggling w/ pain w/ back and legs, though notes this is constant o Notes that she has not had her [facility] home [facility] RN come out lately due to changes in scheduling. She may be getting a new RN from them, may be getting from another agency in the future o Wonders where things stand on her application for disability  . Self-manages medications by using a pill box o HTN/ASCVD risk reduction: lisinopril 40 mg daily; simvastatin 40 mg daily; Appointment scheduled w/ cardiologist Dr. Welton Flakes 06/05/2019. Reports recent SBP continues to be 150-160s - She reports continued occasional "fluttering" in her chest, as well as some shortness of breath and chest discomfort. Discussed w/ PCP previously.  o Venous insufficiency: furosemide 40 mg daily; follows w/ Vascular and continues to use lymphedema pump o COPD/Asthma: montelukast 10 mg daily; Spiriva 18 mcg daily; Dulera 200/50 mcg BID, albuterol nebulizer PRN  o Peripheral neuropathy/pain: gabapentin 300 mg, meloxicam 15 mg, methocarbamol 750 mg daily, tramadol 50 mg daily; overdue for f/u with pain management, but notes that she has been busy managing [name]'s health lately o Depression/anxiety: citalopram 20 mg daily  o Hypothyroid: levothyroxine 50 mcg o GERD: omeprazole 20 mg daily  Pharmacist Clinical Goal(s):  [name] Over the next 90 days, patient will work with PharmD and provider towards optimized medication management  Interventions: . Comprehensive medication review performed, medication list updated in electronic medical record . Reviewed goal BP readings. Encouraged to bring documented home BP  readings to upcoming appointment w/ cardiology. Encouraged to discuss current symptoms to guide work up.  . Reviewed that patient needs to plan to re-establish with pain management. Will collaborate w/ patient with this in the future.  . Provided empathetic listening as she discussed her worries while caring for her husband.   Patient Self Care Activities:  . Patient will take medications as prescribed . Patient will schedule f/u with specialty providers and attend these appointments  Please see past updates related to this goal by clicking on the "Past Updates" button in the selected goal         The patient verbalized understanding of instructions provided today and declined a print copy of patient instruction materials.   Plan:  - Scheduled f/u call 06/27/19  Catie Feliz Beam, PharmD, [facility] Clinical Pharmacist [facility] [phone]

## 2019-05-16 NOTE — Chronic Care Management (AMB) (Signed)
Chronic Care Management   Follow Up Note   05/16/2019 Name: Cheryl Chandler MRN: [id number] DOB: Aug 03, 1965  Referred by: [name], PA-C Reason for referral : Chronic Care Management (Medication Management)   Cheryl Chandler is a 54 y.o. year old female who is a primary care patient of [name], PA-C. The CCM team was consulted for assistance with chronic disease management and care coordination needs.    Contacted patient for medication management review.   Review of patient status, including review of consultants reports, relevant laboratory and other test results, and collaboration with appropriate care team members and the patient's provider was performed as part of comprehensive patient evaluation and provision of chronic care management services.    SDOH (Social Determinants of Health) screening performed today: Transportation Stress. See Care Plan for related entries.   Outpatient Encounter Medications as of 05/16/2019  Medication Sig Note  . albuterol (PROVENTIL) (2.5 MG/3ML) 0.083% nebulizer solution INHALE CONTENTS OF 1 VIAL IN NEBULIZER EVERY 4 HOURS IF NEEDED FOR WHEEZING OR SHORTNESS OF BREATH.   . citalopram (CELEXA) 20 MG tablet Take 1 tablet (20 mg total) by mouth daily.   . furosemide (LASIX) 40 MG tablet Take 1 tablet (40 mg total) by mouth daily.   [name] gabapentin (NEURONTIN) 300 MG capsule Take 1 capsule (300 mg total) by mouth 3 (three) times daily as needed.   [name] lisinopril (ZESTRIL) 40 MG tablet Take 1 tablet (40 mg total) by mouth daily.   . meloxicam (MOBIC) 15 MG tablet TAKE 1 TABLET BY MOUTH ONCE A DAY WITH FOOD OR MILK IF NEEDED   . methocarbamol (ROBAXIN) 750 MG tablet TAKE 1-2 TABLETS BY MOUTH 3 TIMES DAILY IF NEEDED FOR BACK PAIN. 05/16/2019: Taking 2 daily   . mometasone-formoterol (DULERA) 100-5 MCG/ACT AERO INHALE 2 PUFFS BY MOUTH TWICE A DAY. RINSE MOUTH AFTER USE.   . montelukast (SINGULAIR) 10 MG tablet Take 1 tablet  (10 mg total) by mouth daily.   [name] omeprazole (PRILOSEC) 20 MG capsule Take 1 capsule (20 mg total) by mouth daily.   . simvastatin (ZOCOR) 40 MG tablet Take 1 tablet (40 mg total) by mouth daily.   [name] tiotropium (SPIRIVA HANDIHALER) 18 MCG inhalation capsule Place 1 capsule (18 mcg total) into inhaler and inhale daily.   . traMADol (ULTRAM) 50 MG tablet TAKE 1 TABLET BY MOUTH EVERY 8 HOURS AS NEEDED PAIN 10/11/2018: QAM  . Vitamin D, Ergocalciferol, (DRISDOL) 1.25 MG (50000 UT) CAPS capsule Take 1 capsule (50,000 Units total) by mouth every 7 (seven) days.   [name] EPINEPHrine 0.3 mg/0.3 mL IJ SOAJ injection USE AS DIRECTED. (Patient not taking: Reported on 05/16/2019)   . levothyroxine (SYNTHROID) 50 MCG tablet Take 1 tablet (50 mcg total) by mouth daily.   . nitroGLYCERIN (NITROSTAT) 0.4 MG SL tablet DISSOLVE 1 TABLET UNDER TONGUE AT ONSET OF CHEST PAIN. REPEAT IN 5 MIN IF NOT RESOLVED, MAX 3 DOSES. 911 IF NEEDED. (Patient not taking: Reported on 05/16/2019)   . triamcinolone cream (KENALOG) 0.1 % Apply 1 application topically 2 (two) times daily.    No facility-administered encounter medications on file as of 05/16/2019.     Objective:   Goals Addressed            This Visit's Progress     Patient Stated   . PharmD "I have depression, anxiety, and chronic pain" (pt-stated)       Current Barriers:  . Polypharmacy; complex patient with multiple comorbidities  including HTN, venous insufficiency, COPD, peripheral neuropathy, depression/anxiety, arthritis.  o Notes today, struggling w/ pain w/ back and legs, though notes this is constant o Notes that she has not had her [facility] home [facility] RN come out lately due to changes in scheduling. She may be getting a new RN from them, may be getting from another agency in the future o Wonders where things stand on her application for disability  . Self-manages medications by using a pill box o HTN/ASCVD risk reduction: lisinopril 40 mg daily; simvastatin  40 mg daily; Appointment scheduled w/ cardiologist Dr. Welton Flakes 06/05/2019. Reports recent SBP continues to be 150-160s - She reports continued occasional "fluttering" in her chest, as well as some shortness of breath and chest discomfort. Discussed w/ PCP previously.  o Venous insufficiency: furosemide 40 mg daily; follows w/ Vascular and continues to use lymphedema pump o COPD/Asthma: montelukast 10 mg daily; Spiriva 18 mcg daily; Dulera 200/50 mcg BID, albuterol nebulizer PRN  o Peripheral neuropathy/pain: gabapentin 300 mg, meloxicam 15 mg, methocarbamol 750 mg daily, tramadol 50 mg daily; overdue for f/u with pain management, but notes that she has been busy managing [name]'s health lately o Depression/anxiety: citalopram 20 mg daily  o Hypothyroid: levothyroxine 50 mcg o GERD: omeprazole 20 mg daily  Pharmacist Clinical Goal(s):  [name] Over the next 90 days, patient will work with PharmD and provider towards optimized medication management  Interventions: . Comprehensive medication review performed, medication list updated in electronic medical record . Reviewed goal BP readings. Encouraged to bring documented home BP readings to upcoming appointment w/ cardiology. Encouraged to discuss current symptoms to guide work up.  . Reviewed that patient needs to plan to re-establish with pain management. Will collaborate w/ patient with this in the future.  . Provided empathetic listening as she discussed her worries while caring for her husband.   Patient Self Care Activities:  . Patient will take medications as prescribed . Patient will schedule f/u with specialty providers and attend these appointments  Please see past updates related to this goal by clicking on the "Past Updates" button in the selected goal          Plan:  - Scheduled f/u call 06/27/19  Catie Feliz Beam, PharmD, [facility] Clinical Pharmacist [facility] [phone]

## 2019-05-18 DIAGNOSIS — J449 Chronic obstructive pulmonary disease, unspecified: Secondary | ICD-10-CM | POA: Diagnosis not present

## 2019-05-18 DIAGNOSIS — I890 Lymphedema, not elsewhere classified: Secondary | ICD-10-CM | POA: Diagnosis not present

## 2019-05-22 ENCOUNTER — Telehealth: Payer: Self-pay

## 2019-05-25 DIAGNOSIS — J449 Chronic obstructive pulmonary disease, unspecified: Secondary | ICD-10-CM | POA: Diagnosis not present

## 2019-05-25 DIAGNOSIS — I890 Lymphedema, not elsewhere classified: Secondary | ICD-10-CM | POA: Diagnosis not present

## 2019-05-29 ENCOUNTER — Telehealth: Payer: Self-pay | Admitting: Family Medicine

## 2019-05-29 NOTE — Telephone Encounter (Signed)
Routing to provider to see if she knows anything about the disability paperwork.

## 2019-05-29 NOTE — Telephone Encounter (Signed)
Pt called and was inquiring about Visual merchandiser and letter with proof of eligibility from PCP. Could you please let pt know status, evidently she had talked to Fleet Contras about this at her last visit. Pt also asked about applying for Medicare and I gave her the 800 number to medicare.gov [phone] to find out if she may be eligible for benefits due to her disabilities. Thank you, [name] [phone]

## 2019-05-30 NOTE — Telephone Encounter (Signed)
I have not seen any documents for this and we typically do not handle disability applications. Does anyone else know of any forms?

## 2019-06-01 ENCOUNTER — Ambulatory Visit: Payer: Self-pay | Admitting: General Practice

## 2019-06-01 DIAGNOSIS — I10 Essential (primary) hypertension: Secondary | ICD-10-CM

## 2019-06-01 DIAGNOSIS — F419 Anxiety disorder, unspecified: Secondary | ICD-10-CM

## 2019-06-01 NOTE — Chronic Care Management (AMB) (Signed)
Care Management   Follow Up Note   06/01/2019 Name: Cheryl Chandler MRN: [id number] DOB: 05-23-1965  Referred by: [name], PA-C Reason for referral : Care Coordination (Referral for Case management follow )   Emelia Lorey Pallett is a 54 y.o. year old female who is a primary care patient of [name], [place]. The care management team was consulted for assistance with care management and care coordination needs.    Review of patient status, including review of consultants reports, relevant laboratory and other test results, and collaboration with appropriate care team members and the patient's provider was performed as part of comprehensive patient evaluation and provision of chronic care management services.    SDOH (Social Determinants of Health) screening performed today: IT sales professional Insecurity  Depression   Stress Physical Activity. See Care Plan for related entries.   Advanced Directives: See Care Plan and [employer] application for related entries.   BP Readings from Last 3 Encounters:  05/31/19 (!) 153/76  05/03/19 (!) 158/71  02/22/19 (!) 150/68    Goals Addressed            This Visit's Progress    RN- I need more help in the home (pt-stated)       Current Barriers:   Knowledge Deficits related to managing multiple chronic diseases   Lacks caregiver support.   Corporate treasurer.   Transportation barriers- patient depends on others for transporation  Nurse Case Manager Clinical Goal(s):   Over the next 90 days, patient will work with [facility]  to address needs related to Multiple chronic Disease Processes  Over the next 90 days, patient will work with [facility]  (community agency) to Increase Strength and regain some independence   Interventions:   Discussed plans with patient for ongoing care management follow up and provided patient with direct contact information for care management team   Evaluated  current home/life routine and her husband is home and doing well, but still can not drive. No transportation barriers as her husbands Dad comes and takes them to appointments.   Evaluated help in the home and the patient has an aide that comes in and helps with her needs Patient Self Care Activities:    Currently UNABLE TO independently perform ADLs or IADLs   Please see past updates related to this goal by clicking on the "Past Updates" button in the selected goal       RN: My blood pressure is sometimes high       Current Barriers:   Chronic Disease Management support and education needs related to management of HTN and other chronic disease processes  Nurse Case Manager Clinical Goal(s):   Over the next 90 days, patient will work with [facility] and pcp to address needs related to increased stress impacting her health and well being related to managing her blood pressure  Over the next 90 days, patient will attend all scheduled medical appointments: Cardiology appointment on 2/8 and pcp on 2/10  Over the next 90 days, patient will verbalize basic understanding of HTN and heart disease process and self health management plan as evidenced by managing blood pressure more effectively  Interventions:   Evaluation of current treatment plan related to HTN and patient's adherence to plan as established by provider.  Provided education to patient re: exercise and heart healthy diet  Reviewed medications with patient and discussed compliance (also working with CCM pharmacist)  Discussed plans with patient for ongoing care management  follow up and provided patient with direct contact information for care management team  Advised patient, providing education and rationale, to monitor blood pressure daily and record, calling pcp for findings outside established parameters.   Reviewed scheduled/upcoming provider appointments including: cardiologist on 2/8 and pcp on 2/10  Social Work referral  for stressors in her life, loss of her mom and her dad's current lifestyle, and other factors impacting her health  Pharmacy referral for ongoing support for medication management   Patient Self Care Activities:   Patient verbalizes understanding of plan to manage HTN more effectively by institution of life style changes: diet, exercise, reducing stressors  Self administers medications as prescribed  Attends all scheduled provider appointments  Calls provider office for new concerns or questions  Unable to independently manage HTN and other chronic health conditions  Initial goal documentation         Telephone follow up appointment with care management team member scheduled for:07-28-2019 at 1 pm  [place], MSN, [facility] Mobile: [phone]

## 2019-06-01 NOTE — Patient Instructions (Signed)
Visit Information  Goals Addressed            This Visit's Progress   . RN- I need more help in the home (pt-stated)       Current Barriers:  [name] Knowledge Deficits related to managing multiple chronic diseases  . Lacks caregiver support.  . Film/video editor.  . Transportation barriers- patient depends on others for transporation  Nurse Case Manager Clinical Goal(s):  [name] Over the next 90 days, patient will work with [facility]  to address needs related to Multiple chronic Disease Processes . Over the next 90 days, patient will work with [facility]  (community agency) to Increase Strength and regain some independence   Interventions:  . Discussed plans with patient for ongoing care management follow up and provided patient with direct contact information for care management team  . Evaluated current home/life routine and her husband is home and doing well, but still can not drive. No transportation barriers as her husbands Dad comes and takes them to appointments.  . Evaluated help in the home and the patient has an aide that comes in and helps with her needs Patient Self Care Activities:   . Currently UNABLE TO independently perform ADLs or IADLs   Please see past updates related to this goal by clicking on the "Past Updates" button in the selected goal      . RN: My blood pressure is sometimes high       Current Barriers:  . Chronic Disease Management support and education needs related to management of HTN and other chronic disease processes  Nurse Case Manager Clinical Goal(s):  [name] Over the next 90 days, patient will work with [facility] and pcp to address needs related to increased stress impacting her health and well being related to managing her blood pressure . Over the next 90 days, patient will attend all scheduled medical appointments: Cardiology appointment on 2/8 and pcp on 2/10 . Over the next 90 days, patient will verbalize basic understanding of HTN and heart disease  process and self health management plan as evidenced by managing blood pressure more effectively  Interventions:  . Evaluation of current treatment plan related to HTN and patient's adherence to plan as established by provider. . Provided education to patient re: exercise and heart healthy diet . Reviewed medications with patient and discussed compliance (also working with CCM pharmacist) . Discussed plans with patient for ongoing care management follow up and provided patient with direct contact information for care management team . Advised patient, providing education and rationale, to monitor blood pressure daily and record, calling pcp for findings outside established parameters.  . Reviewed scheduled/upcoming provider appointments including: cardiologist on 2/8 and pcp on 2/10 . Social Work referral for stressors in her life, loss of her mom and her dad's current lifestyle, and other factors impacting her health . Pharmacy referral for ongoing support for medication management   Patient Self Care Activities:  . Patient verbalizes understanding of plan to manage HTN more effectively by institution of life style changes: diet, exercise, reducing stressors . Self administers medications as prescribed . Attends all scheduled provider appointments . Calls provider office for new concerns or questions . Unable to independently manage HTN and other chronic health conditions  Initial goal documentation        The patient verbalized understanding of instructions provided today and declined a print copy of patient instruction materials.   Telephone follow up appointment with care management team member scheduled  for:07-28-2019 at 1 pm  Alto [place] RN, MSN, CCM Community Care Coordinator [facility]  [facility] Mobile: [phone]

## 2019-06-03 DIAGNOSIS — J449 Chronic obstructive pulmonary disease, unspecified: Secondary | ICD-10-CM | POA: Diagnosis not present

## 2019-06-03 DIAGNOSIS — I890 Lymphedema, not elsewhere classified: Secondary | ICD-10-CM | POA: Diagnosis not present

## 2019-06-05 NOTE — Telephone Encounter (Signed)
Patient notified

## 2019-06-07 ENCOUNTER — Ambulatory Visit: Payer: Medicaid Other | Admitting: Family Medicine

## 2019-06-16 ENCOUNTER — Telehealth: Payer: Self-pay

## 2019-06-17 DIAGNOSIS — J449 Chronic obstructive pulmonary disease, unspecified: Secondary | ICD-10-CM | POA: Diagnosis not present

## 2019-06-17 DIAGNOSIS — I890 Lymphedema, not elsewhere classified: Secondary | ICD-10-CM | POA: Diagnosis not present

## 2019-06-22 DIAGNOSIS — J449 Chronic obstructive pulmonary disease, unspecified: Secondary | ICD-10-CM | POA: Diagnosis not present

## 2019-06-22 DIAGNOSIS — I890 Lymphedema, not elsewhere classified: Secondary | ICD-10-CM | POA: Diagnosis not present

## 2019-06-27 ENCOUNTER — Ambulatory Visit: Payer: Self-pay | Admitting: Pharmacist

## 2019-06-27 DIAGNOSIS — I872 Venous insufficiency (chronic) (peripheral): Secondary | ICD-10-CM

## 2019-06-27 DIAGNOSIS — I10 Essential (primary) hypertension: Secondary | ICD-10-CM

## 2019-06-27 NOTE — Patient Instructions (Signed)
Visit Information  Goals Addressed            This Visit's Progress     Patient Stated   . PharmD "I have depression, anxiety, and chronic pain" (pt-stated)       CARE PLAN ENTRY (see longtitudinal plan of care for additional care plan information)  Current Barriers:  . Polypharmacy; complex patient with multiple comorbidities including HTN, venous insufficiency, COPD, peripheral neuropathy, depression/anxiety, arthritis.  o Today, notes some symptoms of illness started overnight: diarrhea, sore throat, and she feels cold. Denies HA. Notes that a stomach bug has been going around her family. However, also notes that her [facility] RN has been around her without a mask, and recently returned from being exposed to COVID (though notes she had been out for appropriate quarantine period) o Also reports financial concerns. Cannot schedule stress test d/t other recent spends - high energy bills d/t poor door/windows and energy leak, holes in her floors that she is worried animals can get in . Self-manages medications by using a pill box o HTN/ASCVD risk reduction: lisinopril 40 mg daily; simvastatin 40 mg daily; Reports recent SBP continues to be 150-160s - Established w/ Cardiology Dr. Welton Flakes ([facility]) on 06/05/19. ECHO scheduled in April. Notes that he wanted to schedule a stress test, but that she cannot afford this right now o Venous insufficiency: furosemide 40 mg daily; follows w/ Vascular and continues to use lymphedema pump o COPD/Asthma: montelukast 10 mg daily; Spiriva 18 mcg daily; Dulera 200/50 mcg BID, albuterol nebulizer PRN  o Peripheral neuropathy/pain: gabapentin 300 mg, meloxicam 15 mg, methocarbamol 750 mg daily, tramadol 50 mg daily; overdue for f/u with pain management o Depression/anxiety: citalopram 20 mg daily  o Hypothyroid: levothyroxine 50 mcg o GERD: omeprazole 20 mg daily  Pharmacist Clinical Goal(s):  [name] Over the next 90 days, patient will work with PharmD and provider  towards optimized medication management  Interventions: . Placing Care Guide referral for housing/financial supply. Patient was also interested in pursuing disability; will collaborate w/ Care Guide and LCSW for support with that . Counseled patient that if infectious symptoms do not improve or worsen in 24 hours to go for COVID testing. She verbalized understanding  Patient Self Care Activities:  . Patient will take medications as prescribed . Patient will schedule f/u with specialty providers and attend these appointments  Please see past updates related to this goal by clicking on the "Past Updates" button in the selected goal         Patient verbalizes understanding of instructions provided today.   Plan:  - Scheduled f/u call 08/23/19  Catie Feliz Beam, PharmD, [facility] Clinical Pharmacist [facility] Practice/[facility] [phone]

## 2019-06-27 NOTE — Chronic Care Management (AMB) (Signed)
Chronic Care Management   Follow Up Note   06/27/2019 Name: Quinetta Shilling MRN: [id number] DOB: [birth year]  Referred by: [name], PA-C Reason for referral : Chronic Care Management (Medication Management)   Paityn Jolena Kittle is a 54 y.o. year old female who is a primary care patient of [name], PA-C. The CCM team was consulted for assistance with chronic disease management and care coordination needs.    Contacted patient for medication management review.  Review of patient status, including review of consultants reports, relevant laboratory and other test results, and collaboration with appropriate care team members and the patient's provider was performed as part of comprehensive patient evaluation and provision of chronic care management services.    SDOH (Social Determinants of Health) assessments performed: No See Care Plan activities for detailed interventions related to SDOH)  [name]     Most Recent Value  [name]  [name] for the Following Domains  Financial Strain, Housing  Financial Strain Interventions  Other (Comment) [Care guide referral]  Housing Interventions  Other (Comment) [Care guide referral]       Outpatient Encounter Medications as of 06/27/2019  Medication Sig Note  . albuterol (PROVENTIL) (2.5 MG/3ML) 0.083% nebulizer solution INHALE CONTENTS OF 1 VIAL IN NEBULIZER EVERY 4 HOURS IF NEEDED FOR WHEEZING OR SHORTNESS OF BREATH.   . citalopram (CELEXA) 20 MG tablet Take 1 tablet (20 mg total) by mouth daily.   [name] EPINEPHrine 0.3 mg/0.3 mL IJ SOAJ injection USE AS DIRECTED. (Patient not taking: Reported on 05/16/2019)   . furosemide (LASIX) 40 MG tablet Take 1 tablet (40 mg total) by mouth daily.   [name] gabapentin (NEURONTIN) 300 MG capsule Take 1 capsule (300 mg total) by mouth 3 (three) times daily as needed.   [name] levothyroxine (SYNTHROID) 50 MCG tablet Take 1 tablet (50 mcg total) by mouth daily.    [name] lisinopril (ZESTRIL) 40 MG tablet Take 1 tablet (40 mg total) by mouth daily.   . meloxicam (MOBIC) 15 MG tablet TAKE 1 TABLET BY MOUTH ONCE A DAY WITH FOOD OR MILK IF NEEDED   . methocarbamol (ROBAXIN) 750 MG tablet TAKE 1-2 TABLETS BY MOUTH 3 TIMES DAILY IF NEEDED FOR BACK PAIN. 05/16/2019: Taking 2 daily   . mometasone-formoterol (DULERA) 100-5 MCG/ACT AERO INHALE 2 PUFFS BY MOUTH TWICE A DAY. RINSE MOUTH AFTER USE.   . montelukast (SINGULAIR) 10 MG tablet Take 1 tablet (10 mg total) by mouth daily.   . nitroGLYCERIN (NITROSTAT) 0.4 MG SL tablet DISSOLVE 1 TABLET UNDER TONGUE AT ONSET OF CHEST PAIN. REPEAT IN 5 MIN IF NOT RESOLVED, MAX 3 DOSES. 911 IF NEEDED. (Patient not taking: Reported on 05/16/2019)   . omeprazole (PRILOSEC) 20 MG capsule Take 1 capsule (20 mg total) by mouth daily.   . simvastatin (ZOCOR) 40 MG tablet Take 1 tablet (40 mg total) by mouth daily.   [name] tiotropium (SPIRIVA HANDIHALER) 18 MCG inhalation capsule Place 1 capsule (18 mcg total) into inhaler and inhale daily.   . traMADol (ULTRAM) 50 MG tablet TAKE 1 TABLET BY MOUTH EVERY 8 HOURS AS NEEDED PAIN 10/11/2018: QAM  . triamcinolone cream (KENALOG) 0.1 % Apply 1 application topically 2 (two) times daily.   . Vitamin D, Ergocalciferol, (DRISDOL) 1.25 MG (50000 UT) CAPS capsule Take 1 capsule (50,000 Units total) by mouth every 7 (seven) days.    No facility-administered encounter medications on file as of 06/27/2019.     Objective:   Goals Addressed  This Visit's Progress     Patient Stated   . PharmD "I have depression, anxiety, and chronic pain" (pt-stated)       CARE PLAN ENTRY (see longtitudinal plan of care for additional care plan information)  Current Barriers:  . Polypharmacy; complex patient with multiple comorbidities including HTN, venous insufficiency, COPD, peripheral neuropathy, depression/anxiety, arthritis.  o Today, notes some symptoms of illness started overnight: diarrhea, sore  throat, and she feels cold. Denies HA. Notes that a stomach bug has been going around her family. However, also notes that her [facility] RN has been around her without a mask, and recently returned from being exposed to COVID (though notes she had been out for appropriate quarantine period) o Also reports financial concerns. Cannot schedule stress test d/t other recent spends - high energy bills d/t poor door/windows and energy leak, holes in her floors that she is worried animals can get in . Self-manages medications by using a pill box o HTN/ASCVD risk reduction: lisinopril 40 mg daily; simvastatin 40 mg daily; Reports recent SBP continues to be 150-160s - Established w/ Cardiology Dr. Welton Flakes ([facility]) on 06/05/19. ECHO scheduled in April. Notes that he wanted to schedule a stress test, but that she cannot afford this right now o Venous insufficiency: furosemide 40 mg daily; follows w/ Vascular and continues to use lymphedema pump o COPD/Asthma: montelukast 10 mg daily; Spiriva 18 mcg daily; Dulera 200/50 mcg BID, albuterol nebulizer PRN  o Peripheral neuropathy/pain: gabapentin 300 mg, meloxicam 15 mg, methocarbamol 750 mg daily, tramadol 50 mg daily; overdue for f/u with pain management o Depression/anxiety: citalopram 20 mg daily  o Hypothyroid: levothyroxine 50 mcg o GERD: omeprazole 20 mg daily  Pharmacist Clinical Goal(s):  [name] Over the next 90 days, patient will work with PharmD and provider towards optimized medication management  Interventions: . Placing Care Guide referral for housing/financial supply. Patient was also interested in pursuing disability; will collaborate w/ Care Guide and LCSW for support with that . Counseled patient that if infectious symptoms do not improve or worsen in 24 hours to go for COVID testing. She verbalized understanding  Patient Self Care Activities:  . Patient will take medications as prescribed . Patient will schedule f/u with specialty providers and  attend these appointments  Please see past updates related to this goal by clicking on the "Past Updates" button in the selected goal          Plan:  - Scheduled f/u call 08/23/19  Catie Feliz Beam, PharmD, [facility] Clinical Pharmacist [facility] [phone]

## 2019-06-29 ENCOUNTER — Telehealth: Payer: Self-pay | Admitting: Family Medicine
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
## 2019-06-30 ENCOUNTER — Telehealth (INDEPENDENT_AMBULATORY_CARE_PROVIDER_SITE_OTHER): Payer: Medicaid Other | Admitting: Family Medicine

## 2019-06-30 ENCOUNTER — Encounter: Payer: Self-pay | Admitting: Family Medicine

## 2019-06-30 ENCOUNTER — Telehealth: Payer: Self-pay | Admitting: Family Medicine

## 2019-06-30 VITALS — Ht 64.0 in | Wt 329.0 lb

## 2019-06-30 DIAGNOSIS — I890 Lymphedema, not elsewhere classified: Secondary | ICD-10-CM | POA: Diagnosis not present

## 2019-06-30 DIAGNOSIS — J449 Chronic obstructive pulmonary disease, unspecified: Secondary | ICD-10-CM

## 2019-06-30 DIAGNOSIS — J069 Acute upper respiratory infection, unspecified: Secondary | ICD-10-CM

## 2019-06-30 MED ORDER — AZITHROMYCIN 250 MG PO TABS: ORAL_TABLET | ORAL | 0 refills | Status: DC

## 2019-06-30 NOTE — Progress Notes (Signed)
Ht 5\' 4"  (1.626 m)   Wt (!) 329 lb (149.2 kg)   BMI 56.47 kg/m    Subjective:    Patient ID: Cheryl Chandler, female    DOB: February 05, 1966, 54 y.o.   MRN: [id number]  HPI: Cheryl Chandler is a 54 y.o. female  Chief Complaint  Patient presents with  . Back Pain    upper back since last week  . Leg Pain    bilateral  . Sore Throat  . Cough    . This visit was completed via telephone due to the restrictions of the COVID-19 pandemic. All issues as above were discussed and addressed. Physical exam was done as above through visual confirmation on telephone. If it was felt that the patient should be evaluated in the office, they were directed there. The patient verbally consented to this visit. . Location of the patient: home . Location of the provider: work . Those involved with this call:  . Provider: Merrie Roof, PA-C . CMA: Lesle Chris, [place] . Front Desk/Registration: Jill Side  . Time spent on call: 25 minutes on the phone discussing health concerns. 10 minutes total spent in review of patient's record and preparation of their chart. I verified patient identity using two factors (patient name and date of birth). Patient consents verbally to being seen via telemedicine visit today.   Diarrhea and vomiting last week, sore throat, some cough and wheezing, chest soreness. States a home health nurse came over in the past few weeks without a mask and she's nervous she could have been exposed to something through that. Using chloraseptic spray without much relief. Hx of asthma, COPD compliant with inhaler regimen. Denies fever, chills, SOB.    Has not been wearing compression stockings but has been doing her lymphedema pump that [facility] helped her obtain. The stockings are causing blistering on legs. Edema is significant currently. Has not yet followed up with [facility] about this.   Relevant past medical, surgical, family and social history reviewed and updated as indicated.  Interim medical history since our last visit reviewed. Allergies and medications reviewed and updated.  Review of Systems  Per HPI unless specifically indicated above     Objective:    Ht 5\' 4"  (1.626 m)   Wt (!) 329 lb (149.2 kg)   BMI 56.47 kg/m   Wt Readings from Last 3 Encounters:  06/30/19 (!) 329 lb (149.2 kg)  05/03/19 (!) 329 lb (149.2 kg)  02/22/19 (!) 335 lb (152 kg)    Physical Exam  Unable to perform PE due to technical difficulties with video technology  Results for orders placed or performed in visit on 05/03/19  Comprehensive metabolic panel  Result Value Ref Range   Glucose 85 65 - 99 mg/dL   BUN 12 6 - 24 mg/dL   Creatinine, Ser 0.74 0.57 - 1.00 mg/dL   GFR calc non Af Amer 93 >59 mL/min/1.73   GFR calc Af Amer 108 >59 mL/min/1.73   BUN/Creatinine Ratio 16 9 - 23   Sodium 140 134 - 144 mmol/L   Potassium 4.4 3.5 - 5.2 mmol/L   Chloride 103 96 - 106 mmol/L   CO2 24 20 - 29 mmol/L   Calcium 8.2 (L) 8.7 - 10.2 mg/dL   Total Protein 6.5 6.0 - 8.5 g/dL   Albumin 3.5 (L) 3.8 - 4.9 g/dL   Globulin, Total 3.0 1.5 - 4.5 g/dL   Albumin/Globulin Ratio 1.2 1.2 - 2.2   Bilirubin Total 0.4  0.0 - 1.2 mg/dL   Alkaline Phosphatase 72 39 - 117 IU/L   AST 19 0 - 40 IU/L   ALT 29 0 - 32 IU/L  CBC with Differential/Platelet out  Result Value Ref Range   WBC 8.2 3.4 - 10.8 x10E3/uL   RBC 4.55 3.77 - 5.28 x10E6/uL   Hemoglobin 12.9 11.1 - 15.9 g/dL   Hematocrit 53.6 46.8 - 46.6 %   MCV 91 79 - 97 fL   MCH 28.4 26.6 - 33.0 pg   MCHC 31.0 (L) 31.5 - 35.7 g/dL   RDW 03.2 12.2 - 48.2 %   Platelets 372 150 - 450 x10E3/uL   Neutrophils 64 Not Estab. %   Lymphs 26 Not Estab. %   Monocytes 6 Not Estab. %   Eos 3 Not Estab. %   Basos 1 Not Estab. %   Neutrophils Absolute 5.3 1.4 - 7.0 x10E3/uL   Lymphocytes Absolute 2.1 0.7 - 3.1 x10E3/uL   Monocytes Absolute 0.5 0.1 - 0.9 x10E3/uL   EOS (ABSOLUTE) 0.2 0.0 - 0.4 x10E3/uL   Basophils Absolute 0.1 0.0 - 0.2 x10E3/uL    Immature Granulocytes 0 Not Estab. %   Immature Grans (Abs) 0.0 0.0 - 0.1 x10E3/uL  Lipid Panel w/o Chol/HDL Ratio out  Result Value Ref Range   Cholesterol, Total 152 100 - 199 mg/dL   Triglycerides 88 0 - 149 mg/dL   HDL 41 >50 mg/dL   VLDL Cholesterol Cal 17 5 - 40 mg/dL   LDL Chol Calc (NIH) 94 0 - 99 mg/dL  TSH  Result Value Ref Range   TSH 2.740 0.450 - 4.500 uIU/mL  Vit D  25 hydroxy (rtn osteoporosis monitoring)  Result Value Ref Range   Vit D, 25-Hydroxy 21.1 (L) 30.0 - 100.0 ng/mL  HgB A1c  Result Value Ref Range   Hgb A1c MFr Bld 5.7 (H) 4.8 - 5.6 %   Est. average glucose Bld gHb Est-mCnc 117 mg/dL      Assessment & Plan:   Problem List Items Addressed This Visit      Respiratory   COPD (chronic obstructive pulmonary disease) (HCC)    Exacerbated by URI. Tx with zpak, inhaler regimen. Strict return precautions reviewed if worsening sxs or not resolving        Other   Lymphedema    Currently exacerbated, f/u with Vascular for further direction as leg wraps causing blistering and lymphedema pump not providing adequate relief. Wound care reviewed       Other Visit Diagnoses    Upper respiratory tract infection, unspecified type    -  Primary   Unclear etiology, COVID vs other viral illness but given duration and progression tx with zpak, mucinex. Declines COVID test, will quarantine based on protocol      25 minutes spent today in direct care and counseling over the phone.   Follow up plan: Return for as scheduled.

## 2019-06-30 NOTE — Telephone Encounter (Signed)
Rx sent  Copied from CRM [id number]. Topic: General - Other >> Jun 30, 2019  1:19 PM Dalphine Handing A wrote: Patient would like a callback ffrom nurse with a status update on her antibiotic that was to be called in today from her visit. Pharmacy still has no record. Please advise >> Jun 30, 2019  2:06 PM Pablo Ledger, CMA wrote: Was the patient supposed to be sent in an antibiotic? I do not see one in the chart.

## 2019-06-30 NOTE — Telephone Encounter (Signed)
Patient called in stating she is sore all over and is having sore throat and congestion. Did attempt to get pt visit but she stated she will wait and see if it gets better, or she will go to hospital. Please advise.

## 2019-06-30 NOTE — Telephone Encounter (Signed)
Pt called me directly, wanted to have a c/b from [place] or CMA has complaints about severe pain in legs. And may need antibiotic for sore throat is still having issues, had been taking chloroseptic, may be allergies.   Please call her cell to discuss. Thank you, [name]

## 2019-06-30 NOTE — Telephone Encounter (Signed)
Called pt to let her know that rx has been sent, no answer, left vm

## 2019-06-30 NOTE — Telephone Encounter (Signed)
Update Pt left voicemail to let me know that [facility] told her that the procedures will be $3 each.  Stress Test/Echocardiogram Financial Assistance: Pt will be calling [facility] and Adrian Blackwater M.D. to determine the exact amount that she will responsible for these two tests. Patient stated she had a bad reaction the last time she had this test and is worried. [facility] could possibly assist with paying towards this bill. Pt to call me back with information.

## 2019-06-30 NOTE — Telephone Encounter (Signed)
Virtual appointment this morning with Fleet Contras.

## 2019-07-04 ENCOUNTER — Telehealth: Payer: Self-pay | Admitting: Family Medicine

## 2019-07-04 NOTE — Telephone Encounter (Signed)
   07/04/2019  Name: Cheryl Chandler   MRN: [id number]   DOB: [birth year]   AGE: 54 y.o.   GENDER: female   PCP [name], PA-C.  Called pt to follow up referral  Housing Repair Assistance: Asked about the deed to their home in order to apply for [employer]. Patient stated that family member was not comfortable with that and that they would ask a family friend of [place]. [place] to help repair hole in floor of home.  Energy Bill: Pt stated that she had already paid her [employer] but they have very little leftover in the month. I asked her to please text her next month's bill to me so that we can apply for the [facility] fund to help assist. Will re-open new referral when received.  Social Security Disability: Pt stated that her phone kept disconnecting when she tried to call SS disability # she said she will call again.  Closing referral pending any other needs of patient. [name]  Care Guide . Embedded Care Coordination [facility] Management Samara [email or web address]  [phone]

## 2019-07-04 NOTE — Telephone Encounter (Signed)
Copied from CRM [id number]. Topic: General - Other >> Jul 04, 2019  9:53 AM Cheryl Chandler wrote: Reason for CRM: Patient called to inform Cheryl Chandler that she took all her antibiotic but is still has that sniffles, and sore throat asking if she can get another round of atbs. azithromycin (ZITHROMAX) 250 MG tablet sent to the pharmacy. Please call with questions Ph# [phone]

## 2019-07-04 NOTE — Telephone Encounter (Signed)
Called and left a detailed message for patient.  

## 2019-07-04 NOTE — Telephone Encounter (Signed)
The antibiotic continues to work for about a week after she finishes so that should take care of things, she should make sure to be taking her singulair and antihistamine daily because those symptoms could also be related to allergies

## 2019-07-06 ENCOUNTER — Encounter: Payer: Self-pay | Admitting: Family Medicine

## 2019-07-10 ENCOUNTER — Ambulatory Visit: Payer: Self-pay | Admitting: Licensed Clinical Social Worker

## 2019-07-10 NOTE — Chronic Care Management (AMB) (Signed)
Care Management   Follow Up Note   07/10/2019 Name: Cheryl Chandler MRN: [id number] DOB: 05-23-1965  Referred by: [name], PA-C Reason for referral : Care Coordination   Cheryl Chandler is a 54 y.o. year old female who is a primary care patient of Particia Chandler, [place]. The care management team was consulted for assistance with care management and care coordination needs.    Review of patient status, including review of consultants reports, relevant laboratory and other test results, and collaboration with appropriate care team members and the patient's provider was performed as part of comprehensive patient evaluation and provision of chronic care management services.    Advanced Directives: See Care Plan and Vynca application for related entries.   Goals Addressed    . SW-"I need to focus on taking better care of myself" (pt-stated)       Current Barriers:  . Financial constraints . Mental Health Concerns  . Social Isolation . Limited education about mental health support resources that are available to her within the local area* . Cognitive Deficits . Lacks knowledge of community resource: Grief support resources/programs that are offered free of charge  within the area  Clinical Social Work Clinical Goal(s):  [name] Over the next 90 days, client will work with SW to address concerns related to experiencing ongoing symptoms of depression, sadness and grief since the passing of her mother . Over the next 90 days, patient will work with LCSW to address needs related to implementing appropriate self-care and depression management.   Interventions: . Patient interviewed and appropriate assessments performed . Provided mental health counseling with regards to patient's loss and ongoing stressors. Patient denies wanting LCSW to make a referral for grief counseling through [facility] again but was receptive to coping skill and resource education/information  that was provided during session today. . Provided patient with information about managing depression within her daily life. Patient has ongoing grief symptoms as well. Patient reports that she talks out loud to her mother's picture and this is very helpful to her. . Provided reflective listening and implemented appropriate interventions to help suppport patient and her emotional needs  . Discussed plans with patient for ongoing care management follow up and provided patient with direct contact information for care management team . Advised patient to contact CCM team with any urgent case management needs. . Assisted patient/caregiver with obtaining information about health plan benefits . LCSW completed referral for family to have a wheelchair ramp built at their residence due to ongoing mobility concerns. Ramp has been successfully installed.  [name] PCS actively involved with patient and providing ongoing services.  [name] Positive reinforcement provided to patient for quitting smoking cigarettes. Coping skill review education provided for future triggers.  . Brief self-care education provided to both patient and spouse. . Patient confirms that she continues to go to food pantry for food insecurity. Resource information has been provided.  . Patient reports ongoing pain and issues with mobility. Patient shares that her pain resides in her pains and legs. Emotional support and coping skill education provided. . Patient confirms that her PCS aide continues to provide her with services. However, aide has to be reminded to put on her mask since patient is high risk. [name] LCSW reviewed resources that [name] provided her with earlier this month which includes- . Housing Repair Assistance: . Asked about the deed to their home in order to apply for [employer]. Patient stated that family member was  not comfortable with that and that they would ask a family friend of [place] to help repair  hole in floor of home.  . Energy Bill: . Pt stated that she had already paid her [employer] bill but they have very little leftover in the month. I asked her to please text her next month's bill to me so that we can apply for the [facility] fund to help assist. Will re-open new referral when received. . Social Security Disability: . Pt stated that her phone kept disconnecting when she tried to call SS disability # she said she will call again.  . Closing referral pending any other needs of patient.   Patient Self Care Activities:  . Attends all scheduled provider appointments . Calls provider office for new concerns or questions  Please see past updates related to this goal by clicking on the "Past Updates" button in the selected goal      The care management team will reach out to the patient again over the next quarter.  Cheryl Chandler, BSW, MSW, [facility] Care Management [email or web address] Phone: [phone]

## 2019-07-10 NOTE — Assessment & Plan Note (Signed)
Currently exacerbated, f/u with Vascular for further direction as leg wraps causing blistering and lymphedema pump not providing adequate relief. Wound care reviewed

## 2019-07-10 NOTE — Assessment & Plan Note (Signed)
Exacerbated by URI. Tx with zpak, inhaler regimen. Strict return precautions reviewed if worsening sxs or not resolving

## 2019-07-11 ENCOUNTER — Telehealth: Payer: Self-pay | Admitting: Family Medicine

## 2019-07-11 NOTE — Chronic Care Management (AMB) (Signed)
  Care Management   Note  07/11/2019 Name: Cheryl Chandler MRN: [id number] DOB: 1965/10/30  [name] is a 54 y.o. year old female who is a primary care patient of [name], Cordelia Poche and is actively engaged with the care management team. I reached out to Dhalia Guadalupe Dawn by phone today to assist with re-scheduling a follow up visit with the RN Case Manager  Follow up plan: Telephone appointment with care management team member scheduled for: 08/18/2019  Penne Lash, RMA Care Guide, Embedded Care Coordination [facility]  [place], [place] [address] Direct Dial: [phone] [email or web address] Website: [email or web address]

## 2019-07-11 NOTE — Chronic Care Management (AMB) (Signed)
°  Care Management   Note  07/11/2019 Name: Cheryl Chandler MRN: [id number] DOB: 1966-02-18  Cheryl Chandler is a 54 y.o. year old female who is a primary care patient of [name], Cordelia Poche and is actively engaged with the care management team. I reached out to Hoang Guadalupe Dawn by phone today to assist with re-scheduling a follow up visit with the RN Case Manager  Follow up plan: Unsuccessful telephone outreach attempt made. A HIPPA compliant phone message was left for the patient providing contact information and requesting a return call.  The care management team will reach out to the patient again over the next 7 days.  If patient returns call to provider office, please advise to call Embedded Care Management Care Guide Penne Lash  at [phone]  Penne Lash, RMA Care Guide, Embedded Care Coordination [facility]  [place], [place] [address] Direct Dial: [phone] [email or web address] Website: [email or web address]

## 2019-07-15 DIAGNOSIS — I890 Lymphedema, not elsewhere classified: Secondary | ICD-10-CM | POA: Diagnosis not present

## 2019-07-15 DIAGNOSIS — J449 Chronic obstructive pulmonary disease, unspecified: Secondary | ICD-10-CM | POA: Diagnosis not present

## 2019-07-21 ENCOUNTER — Telehealth: Payer: Self-pay | Admitting: Family Medicine

## 2019-07-21 NOTE — Telephone Encounter (Signed)
   KNB 07/21/2019 1st Attempt  Name: Cheryl Chandler   MRN: [id number]   DOB: 07-Jan-1966   AGE: 54 y.o.   GENDER: female   PCP [name], PA-C.   Returning patient's call LMTCB Follow up 07/24/19  [name]  Care Guide . Embedded Care Coordination [facility] Management [email or web address]  [phone]

## 2019-07-22 DIAGNOSIS — I890 Lymphedema, not elsewhere classified: Secondary | ICD-10-CM | POA: Diagnosis not present

## 2019-07-22 DIAGNOSIS — J449 Chronic obstructive pulmonary disease, unspecified: Secondary | ICD-10-CM | POA: Diagnosis not present

## 2019-07-26 NOTE — Telephone Encounter (Signed)
Patient called in stating she is wanting to speak with Ms.Manson Passey in regards to a few things for herself and husband. Please advise.

## 2019-07-27 NOTE — Telephone Encounter (Signed)
Spoke with pt regarding [facility] [facility]'s financial assistance she wanted to know the status and transferred her to [facility] Billing told her to let them know that it was mailed from [facility] on March 8th. Closing referral pending any other needs of patient.[name]

## 2019-07-28 ENCOUNTER — Telehealth: Payer: Self-pay

## 2019-08-02 ENCOUNTER — Ambulatory Visit: Payer: Medicaid Other | Admitting: Family Medicine

## 2019-08-03 ENCOUNTER — Other Ambulatory Visit: Payer: Self-pay | Admitting: Family Medicine

## 2019-08-03 NOTE — Telephone Encounter (Signed)
Requested Prescriptions  Pending Prescriptions Disp Refills  . lisinopril (ZESTRIL) 40 MG tablet [Pharmacy Med Name: LISINOPRIL 40 MG TAB] 90 tablet 0    Sig: TAKE 1 TABLET BY MOUTH ONCE DAILY     Cardiovascular:  ACE Inhibitors Failed - 08/03/2019  4:50 PM      Failed - Last BP in normal range    BP Readings from Last 1 Encounters:  05/31/19 (!) 153/76         Passed - Cr in normal range and within 180 days    Creatinine  Date Value Ref Range Status  08/05/2014 0.86 mg/dL Final    Comment:    3.79-0.24 NOTE: New Reference Range  07/03/14    Creatinine, Ser  Date Value Ref Range Status  05/03/2019 0.74 0.57 - 1.00 mg/dL Final         Passed - K in normal range and within 180 days    Potassium  Date Value Ref Range Status  05/03/2019 4.4 3.5 - 5.2 mmol/L Final  08/05/2014 4.5 mmol/L Final    Comment:    3.5-5.1 NOTE: New Reference Range  07/03/14          Passed - Patient is not pregnant      Passed - Valid encounter within last 6 months    Recent Outpatient Visits          1 month ago Upper respiratory tract infection, unspecified type   [facility] [name], [place]   3 months ago Essential hypertension   [facility] Roosvelt Maser [place], [place]   5 months ago Rash   [facility] [name], [place]   6 months ago Essential hypertension   [facility] Roosvelt Maser [place], [place]   9 months ago Essential hypertension   [facility] [place], Salley Hews, [place]      Future Appointments            In 1 month Maurice March, [place], PA-C [facility], [facility]

## 2019-08-10 DIAGNOSIS — I890 Lymphedema, not elsewhere classified: Secondary | ICD-10-CM | POA: Diagnosis not present

## 2019-08-10 DIAGNOSIS — J449 Chronic obstructive pulmonary disease, unspecified: Secondary | ICD-10-CM | POA: Diagnosis not present

## 2019-08-17 DIAGNOSIS — I890 Lymphedema, not elsewhere classified: Secondary | ICD-10-CM | POA: Diagnosis not present

## 2019-08-17 DIAGNOSIS — J449 Chronic obstructive pulmonary disease, unspecified: Secondary | ICD-10-CM | POA: Diagnosis not present

## 2019-08-18 ENCOUNTER — Other Ambulatory Visit: Payer: Self-pay | Admitting: Family Medicine

## 2019-08-18 ENCOUNTER — Telehealth: Payer: Self-pay | Admitting: General Practice

## 2019-08-18 ENCOUNTER — Ambulatory Visit: Payer: Self-pay | Admitting: General Practice

## 2019-08-18 DIAGNOSIS — M62838 Other muscle spasm: Secondary | ICD-10-CM

## 2019-08-18 DIAGNOSIS — I890 Lymphedema, not elsewhere classified: Secondary | ICD-10-CM

## 2019-08-18 DIAGNOSIS — I10 Essential (primary) hypertension: Secondary | ICD-10-CM

## 2019-08-18 DIAGNOSIS — F419 Anxiety disorder, unspecified: Secondary | ICD-10-CM

## 2019-08-18 DIAGNOSIS — J449 Chronic obstructive pulmonary disease, unspecified: Secondary | ICD-10-CM

## 2019-08-18 NOTE — Patient Instructions (Signed)
Visit Information  Goals Addressed            This Visit's Progress   . RN- I need more help in the home (pt-stated)       Current Barriers:  [name] Knowledge Deficits related to managing multiple chronic diseases  . Lacks caregiver support.  . Corporate treasurer.  . Transportation barriers- patient depends on others for transporation  Nurse Case Manager Clinical Goal(s):  [name] Over the next 120 days, patient will work with [facility]  to address needs related to Multiple chronic Disease Processes . Over the next 120 days, patient will work with [facility]  (community agency) to Increase Strength and regain some independence   Interventions:  . Discussed plans with patient for ongoing care management follow up and provided patient with direct contact information for care management team  . Evaluated current home/life routine and her husband is home and doing well, but still can not drive. No transportation barriers as her husbands Dad comes and takes them to appointments.  . Evaluated help in the home and the patient has an aide that comes in and helps with her needs.  The patient has an aide that comes in every Thursday.  She is doing well with this.    Patient Self Care Activities:   . Currently UNABLE TO independently perform ADLs or IADLs   Please see past updates related to this goal by clicking on the "Past Updates" button in the selected goal      . RN: My blood pressure is sometimes high       Current Barriers:  . Chronic Disease Management support and education needs related to management of HTN and other chronic disease processes  Nurse Case Manager Clinical Goal(s):  [name] Over the next 120 days, patient will work with [facility] and pcp to address needs related to increased stress impacting her health and well being related to managing her blood pressure . Over the next 120 days, patient will attend all scheduled medical appointments: Cardiology appointment on 2/8 and pcp on  2/10 . Over the next 120 days, patient will verbalize basic understanding of HTN and heart disease process and self health management plan as evidenced by managing blood pressure more effectively  Interventions:  . Evaluation of current treatment plan related to HTN and patient's adherence to plan as established by provider. . Provided education to patient re: exercise and heart healthy diet . Reviewed medications with patient and discussed compliance (also working with CCM pharmacist) . Evaluation of blood pressure readings. The patient has blood pressure readings up and down. The patient does get headaches at times.  The patient admits she does not always watch the sodium in the foods.  Education and support given . Evaluation of swelling. The patient has not been able to wear compression stocking. The patient says it causes her to have blisters on her legs.  The patient is elevating her legs and using her lymphedema.  . Discussed plans with patient for ongoing care management follow up and provided patient with direct contact information for care management team . Advised patient, providing education and rationale, to monitor blood pressure daily and record, calling pcp for findings outside established parameters.  . Reviewed scheduled/upcoming provider appointments including: pcp in May . Social Work referral for stressors in her life, loss of her mom and her dad's current lifestyle, and other factors impacting her health- working with LCSW . Pharmacy referral for ongoing support for medication management  Patient Self Care Activities:  . Patient verbalizes understanding of plan to manage HTN more effectively by institution of life style changes: diet, exercise, reducing stressors . Self administers medications as prescribed . Attends all scheduled provider appointments . Calls provider office for new concerns or questions . Unable to independently manage HTN and other chronic health  conditions  Please see past updates related to this goal by clicking on the "Past Updates" button in the selected goal         Patient verbalizes understanding of instructions provided today.   Telephone follow up appointment with care management team member scheduled for:10-18-2019 2 pm  Noreene Larsson RN, MSN, [facility] Mobile: [phone]

## 2019-08-18 NOTE — Chronic Care Management (AMB) (Signed)
Care Management   Follow Up Note   08/18/2019 Name: Cheryl Chandler MRN: [id number] DOB: 1965-10-29  Referred by: [name], PA-C Reason for referral : Care Coordination (Chronic disease management and care coordination needs)   Cheryl Chandler is a 54 y.o. year old female who is a primary care patient of [name], PA-C. The care management team was consulted for assistance with care management and care coordination needs.    Review of patient status, including review of consultants reports, relevant laboratory and other test results, and collaboration with appropriate care team members and the patient's provider was performed as part of comprehensive patient evaluation and provision of chronic care management services.    SDOH (Social Determinants of Health) assessments performed: No See Care Plan activities for detailed interventions related to [facility])     Advanced Directives: See Care Plan and Vynca application for related entries.   Goals Addressed            This Visit's Progress   . RN- I need more help in the home (pt-stated)       Current Barriers:  [name] Knowledge Deficits related to managing multiple chronic diseases  . Lacks caregiver support.  . Corporate treasurer.  . Transportation barriers- patient depends on others for transporation  Nurse Case Manager Clinical Goal(s):  [name] Over the next 120 days, patient will work with [facility]  to address needs related to Multiple chronic Disease Processes . Over the next 120 days, patient will work with [facility]  (community agency) to Increase Strength and regain some independence   Interventions:  . Discussed plans with patient for ongoing care management follow up and provided patient with direct contact information for care management team  . Evaluated current home/life routine and her husband is home and doing well, but still can not drive. No transportation barriers as her  husbands Dad comes and takes them to appointments.  . Evaluated help in the home and the patient has an aide that comes in and helps with her needs.  The patient has an aide that comes in every Thursday.  She is doing well with this.    Patient Self Care Activities:   . Currently UNABLE TO independently perform ADLs or IADLs   Please see past updates related to this goal by clicking on the "Past Updates" button in the selected goal      . RN: My blood pressure is sometimes high       Current Barriers:  . Chronic Disease Management support and education needs related to management of HTN and other chronic disease processes  Nurse Case Manager Clinical Goal(s):  [name] Over the next 120 days, patient will work with [facility] and pcp to address needs related to increased stress impacting her health and well being related to managing her blood pressure . Over the next 120 days, patient will attend all scheduled medical appointments: Cardiology appointment on 2/8 and pcp on 2/10 . Over the next 120 days, patient will verbalize basic understanding of HTN and heart disease process and self health management plan as evidenced by managing blood pressure more effectively  Interventions:  . Evaluation of current treatment plan related to HTN and patient's adherence to plan as established by provider. . Provided education to patient re: exercise and heart healthy diet . Reviewed medications with patient and discussed compliance (also working with CCM pharmacist) . Evaluation of blood pressure readings. The patient has blood pressure readings up and  down. The patient does get headaches at times.  The patient admits she does not always watch the sodium in the foods.  Education and support given . Evaluation of swelling. The patient has not been able to wear compression stocking. The patient says it causes her to have blisters on her legs.  The patient is elevating her legs and using her lymphedema.  . Discussed  plans with patient for ongoing care management follow up and provided patient with direct contact information for care management team . Advised patient, providing education and rationale, to monitor blood pressure daily and record, calling pcp for findings outside established parameters.  . Reviewed scheduled/upcoming provider appointments including: pcp in May . Social Work referral for stressors in her life, loss of her mom and her dad's current lifestyle, and other factors impacting her health- working with LCSW . Pharmacy referral for ongoing support for medication management   Patient Self Care Activities:  . Patient verbalizes understanding of plan to manage HTN more effectively by institution of life style changes: diet, exercise, reducing stressors . Self administers medications as prescribed . Attends all scheduled provider appointments . Calls provider office for new concerns or questions . Unable to independently manage HTN and other chronic health conditions  Please see past updates related to this goal by clicking on the "Past Updates" button in the selected goal          Telephone follow up appointment with care management team member scheduled for: 10-18-2019 at 2:00  [place], MSN, [facility] Mobile: [phone]

## 2019-08-18 NOTE — Telephone Encounter (Signed)
Requested medication (s) are due for refill today: no  Requested medication (s) are on the active medication list: yes  Last refill:  02/06/2019  Future visit scheduled: yes  Notes to clinic:  this refill cannot be delegated    Requested Prescriptions  Pending Prescriptions Disp Refills   traMADol (ULTRAM) 50 MG tablet [Pharmacy Med Name: TRAMADOL HCL 50 MG TAB] 30 tablet     Sig: TAKE 1 TABLET BY MOUTH EVERY 8 HOURS AS NEEDED PAIN      Not Delegated - Analgesics:  Opioid Agonists Failed - 08/18/2019  3:28 PM      Failed - This refill cannot be delegated      Failed - Urine Drug Screen completed in last 360 days.      Passed - Valid encounter within last 6 months    Recent Outpatient Visits           1 month ago Upper respiratory tract infection, unspecified type   [facility] [name], [place]   3 months ago Essential hypertension   [facility] Roosvelt Maser [place], [place]   5 months ago Rash   [facility] Roosvelt Maser [place], [place]   7 months ago Essential hypertension   [facility] Roosvelt Maser [place], [place]   9 months ago Essential hypertension   [facility] [place], Salley Hews, [place]       Future Appointments             In 2 weeks Maurice March, [place], PA-C [facility], [facility]

## 2019-08-18 NOTE — Telephone Encounter (Signed)
LOV: 05/03/2019, NOV: 09/06/2019

## 2019-08-18 NOTE — Telephone Encounter (Signed)
Requested medication (s) are due for refill today -as needed  Requested medication (s) are on the active medication list -yes  Future visit scheduled - no  Last refill: 05/03/19  Notes to clinic: Request non delegated Rx  Requested Prescriptions  Pending Prescriptions Disp Refills   methocarbamol (ROBAXIN) 750 MG tablet [Pharmacy Med Name: METHOCARBAMOL 750 MG TAB] 90 tablet 0    Sig: TAKE 1-2 TABLETS BY MOUTH 3 TIMES DAILY AS NEEDED      Not Delegated - Analgesics:  Muscle Relaxants Failed - 08/18/2019  3:28 PM      Failed - This refill cannot be delegated      Passed - Valid encounter within last 6 months    Recent Outpatient Visits           1 month ago Upper respiratory tract infection, unspecified type   [facility] [name], [place]   3 months ago Essential hypertension   [facility] Roosvelt Maser [place], [place]   5 months ago Rash   [facility] Roosvelt Maser [place], [place]   7 months ago Essential hypertension   [facility] Roosvelt Maser [place], [place]   9 months ago Essential hypertension   [facility] [place], Salley Hews, [place]       Future Appointments             In 2 weeks Maurice March, [place], PA-C [facility], [facility]                Requested Prescriptions  Pending Prescriptions Disp Refills   methocarbamol (ROBAXIN) 750 MG tablet [Pharmacy Med Name: METHOCARBAMOL 750 MG TAB] 90 tablet 0    Sig: TAKE 1-2 TABLETS BY MOUTH 3 TIMES DAILY AS NEEDED      Not Delegated - Analgesics:  Muscle Relaxants Failed - 08/18/2019  3:28 PM      Failed - This refill cannot be delegated      Passed - Valid encounter within last 6 months    Recent Outpatient Visits           1 month ago Upper respiratory tract infection, unspecified type   [facility] [name], [place]   3 months ago Essential hypertension   [facility] Roosvelt Maser  [place], [place]   5 months ago Rash   [facility] Roosvelt Maser [place], [place]   7 months ago Essential hypertension   [facility] Roosvelt Maser [place], [place]   9 months ago Essential hypertension   [facility] [place], Salley Hews, [place]       Future Appointments             In 2 weeks Maurice March, [place], PA-C [facility], [facility]

## 2019-08-19 NOTE — Telephone Encounter (Signed)
I believe you sent this to the wrong practice

## 2019-08-21 NOTE — Telephone Encounter (Signed)
Please get patient scheduled for a follow up to have tramadol refilled.

## 2019-08-21 NOTE — Telephone Encounter (Signed)
Routing to provider  

## 2019-08-22 NOTE — Telephone Encounter (Signed)
Pt has apt on 4/30/202.Pt verbalized and confirmed understanding.

## 2019-08-23 ENCOUNTER — Ambulatory Visit: Payer: Self-pay | Admitting: Pharmacist

## 2019-08-23 DIAGNOSIS — I872 Venous insufficiency (chronic) (peripheral): Secondary | ICD-10-CM

## 2019-08-23 DIAGNOSIS — I890 Lymphedema, not elsewhere classified: Secondary | ICD-10-CM

## 2019-08-23 DIAGNOSIS — F419 Anxiety disorder, unspecified: Secondary | ICD-10-CM

## 2019-08-23 NOTE — Patient Instructions (Signed)
Visit Information  Goals Addressed            This Visit's Progress     Patient Stated   . PharmD "I have depression, anxiety, and chronic pain" (pt-stated)       CARE PLAN ENTRY (see longtitudinal plan of care for additional care plan information)  Current Barriers:  . Polypharmacy; complex patient with multiple comorbidities including HTN, venous insufficiency, COPD, peripheral neuropathy, depression/anxiety, arthritis.  o Wonders if her insurance will pay for the wheel chair carrier that goes on the back of the car . Self-manages medications by using a pill box o HTN/ASCVD risk reduction: lisinopril 40 mg daily; simvastatin 40 mg daily;  - Upcoming ECHO 09/11/19 - Home BP: reports fluctuant BP readings, SBP 130-190s, generally >150 - Does report use of NTG twice in the past week. She does note that she told cardiology about episodes of chest pain. Pain has always been relieved by 1 dose of NTG. o Venous insufficiency: furosemide 40 mg daily; follows w/ Vascular and continues to use lymphedema pump per Dr. Gilda Crease  o COPD/Asthma: montelukast 10 mg daily; Spiriva 18 mcg daily; Dulera 200/50 mcg BID, albuterol nebulizer PRN, usually daily recently. o Peripheral neuropathy/pain: gabapentin 300 mg, meloxicam 15 mg, methocarbamol 750 mg daily, tramadol 50 mg daily- not taking tramadol because she ran out, has appt w/ PCP in ~2 weeks to address and refill  o Depression/anxiety: citalopram 20 mg daily  - Notes it has now been ~1 year since her mother passed. Is struggling with this recently.  o Hypothyroid: levothyroxine 50 mcg o GERD: omeprazole 20 mg daily  Pharmacist Clinical Goal(s):  [name] Over the next 90 days, patient will work with PharmD and provider towards optimized medication management  Interventions: . Comprehensive medication review performed, medication list updated in electronic medical record . Inter-disciplinary care team collaboration (see longitudinal plan of  care) . Provided empathetic listening as patient discussed missing her mother . Discussed need for improved BP control. Encouraged to continue to monitor BP, document, and provide at future appointments. Consider addition of thiazide diuretic like HCTZ or chlorthalidone moving forward. May also provide some diuretic benefit for lymphedema. Monitor for dehydration in combination w/ furosemide.  . Reviewed counseling points for NTG. Reminded patient that if she needs a second dose to call 911 immediately. She verbalized understanding.   Patient Self Care Activities:  . Patient will take medications as prescribed . Patient will schedule f/u with specialty providers and attend these appointments  Please see past updates related to this goal by clicking on the "Past Updates" button in the selected goal         Patient verbalizes understanding of instructions provided today.   Plan:  - Scheduled f/u call in ~10 weeks  Catie Feliz Beam, PharmD, [facility] Clinical Pharmacist [facility] [phone]

## 2019-08-23 NOTE — Chronic Care Management (AMB) (Signed)
Chronic Care Management   Follow Up Note   08/23/2019 Name: Cheryl Chandler MRN: [id number] DOB: 07-27-1965  Referred by: [name], PA-C Reason for referral : Chronic Care Management (Medication Management)   Cheryl Chandler is a 54 y.o. year old female who is a primary care patient of [name], PA-C. The CCM team was consulted for assistance with chronic disease management and care coordination needs.    Contacted patient for medication management review.   Review of patient status, including review of consultants reports, relevant laboratory and other test results, and collaboration with appropriate care team members and the patient's provider was performed as part of comprehensive patient evaluation and provision of chronic care management services.    SDOH (Social Determinants of Health) assessments performed: Yes See Care Plan activities for detailed interventions related to [facility])     Outpatient Encounter Medications as of 08/23/2019  Medication Sig Note  . albuterol (PROVENTIL) (2.5 MG/3ML) 0.083% nebulizer solution INHALE CONTENTS OF 1 VIAL IN NEBULIZER EVERY 4 HOURS IF NEEDED FOR WHEEZING OR SHORTNESS OF BREATH.   . citalopram (CELEXA) 20 MG tablet Take 1 tablet (20 mg total) by mouth daily.   . furosemide (LASIX) 40 MG tablet Take 1 tablet (40 mg total) by mouth daily.   [name] gabapentin (NEURONTIN) 300 MG capsule Take 1 capsule (300 mg total) by mouth 3 (three) times daily as needed.   [name] levothyroxine (SYNTHROID) 50 MCG tablet Take 1 tablet (50 mcg total) by mouth daily.   [name] lisinopril (ZESTRIL) 40 MG tablet TAKE 1 TABLET BY MOUTH ONCE DAILY   . meloxicam (MOBIC) 15 MG tablet TAKE 1 TABLET BY MOUTH ONCE A DAY WITH FOOD OR MILK IF NEEDED   . methocarbamol (ROBAXIN) 750 MG tablet TAKE 1-2 TABLETS BY MOUTH 3 TIMES DAILY AS NEEDED 08/23/2019: Usually 2 doses daily  . mometasone-formoterol (DULERA) 100-5 MCG/ACT AERO INHALE 2 PUFFS BY MOUTH TWICE A  DAY. RINSE MOUTH AFTER USE.   . montelukast (SINGULAIR) 10 MG tablet Take 1 tablet (10 mg total) by mouth daily.   . nitroGLYCERIN (NITROSTAT) 0.4 MG SL tablet DISSOLVE 1 TABLET UNDER TONGUE AT ONSET OF CHEST PAIN. REPEAT IN 5 MIN IF NOT RESOLVED, MAX 3 DOSES. 911 IF NEEDED.   [name] omeprazole (PRILOSEC) 20 MG capsule Take 1 capsule (20 mg total) by mouth daily.   . simvastatin (ZOCOR) 40 MG tablet Take 1 tablet (40 mg total) by mouth daily.   [name] tiotropium (SPIRIVA HANDIHALER) 18 MCG inhalation capsule Place 1 capsule (18 mcg total) into inhaler and inhale daily.   . Vitamin D, Ergocalciferol, (DRISDOL) 1.25 MG (50000 UT) CAPS capsule Take 1 capsule (50,000 Units total) by mouth every 7 (seven) days.   [name] EPINEPHrine 0.3 mg/0.3 mL IJ SOAJ injection USE AS DIRECTED.   [name] traMADol (ULTRAM) 50 MG tablet TAKE 1 TABLET BY MOUTH EVERY 8 HOURS AS NEEDED PAIN (Patient not taking: Reported on 08/23/2019) 10/11/2018: QAM  . triamcinolone cream (KENALOG) 0.1 % Apply 1 application topically 2 (two) times daily.   . [DISCONTINUED] azithromycin (ZITHROMAX) 250 MG tablet Take 2 tabs day one, then 1 tab daily until complete    No facility-administered encounter medications on file as of 08/23/2019.     Objective:   Goals Addressed            This Visit's Progress     Patient Stated   . PharmD "I have depression, anxiety, and chronic pain" (pt-stated)  CARE PLAN ENTRY (see longtitudinal plan of care for additional care plan information)  Current Barriers:  . Polypharmacy; complex patient with multiple comorbidities including HTN, venous insufficiency, COPD, peripheral neuropathy, depression/anxiety, arthritis.  o Wonders if her insurance will pay for the wheel chair carrier that goes on the back of the car . Self-manages medications by using a pill box o HTN/ASCVD risk reduction: lisinopril 40 mg daily; simvastatin 40 mg daily;  - Upcoming ECHO 09/11/19 - Home BP: reports fluctuant BP readings, SBP  130-190s, generally >150 - Does report use of NTG twice in the past week. She does note that she told cardiology about episodes of chest pain. Pain has always been relieved by 1 dose of NTG. o Venous insufficiency: furosemide 40 mg daily; follows w/ Vascular and continues to use lymphedema pump per Dr. Delana Meyer  o COPD/Asthma: montelukast 10 mg daily; Spiriva 18 mcg daily; Dulera 200/50 mcg BID, albuterol nebulizer PRN, usually daily recently. o Peripheral neuropathy/pain: gabapentin 300 mg, meloxicam 15 mg, methocarbamol 750 mg daily, tramadol 50 mg daily- not taking tramadol because she ran out, has appt w/ PCP in ~2 weeks to address and refill  o Depression/anxiety: citalopram 20 mg daily  - Notes it has now been ~1 year since her mother passed. Is struggling with this recently.  o Hypothyroid: levothyroxine 50 mcg o GERD: omeprazole 20 mg daily  Pharmacist Clinical Goal(s):  [name] Over the next 90 days, patient will work with PharmD and provider towards optimized medication management  Interventions: . Comprehensive medication review performed, medication list updated in electronic medical record . Inter-disciplinary care team collaboration (see longitudinal plan of care) . Provided empathetic listening as patient discussed missing her mother . Discussed need for improved BP control. Encouraged to continue to monitor BP, document, and provide at future appointments. Consider addition of thiazide diuretic like HCTZ or chlorthalidone moving forward. May also provide some diuretic benefit for lymphedema. Monitor for dehydration in combination w/ furosemide.  . Reviewed counseling points for NTG. Reminded patient that if she needs a second dose to call 911 immediately. She verbalized understanding.   Patient Self Care Activities:  . Patient will take medications as prescribed . Patient will schedule f/u with specialty providers and attend these appointments  Please see past updates related to this  goal by clicking on the "Past Updates" button in the selected goal          Plan:  - Scheduled f/u call in ~10 weeks  [name], PharmD, [place] [phone]

## 2019-08-25 ENCOUNTER — Ambulatory Visit: Payer: Medicaid Other | Admitting: Family Medicine

## 2019-08-31 DIAGNOSIS — J449 Chronic obstructive pulmonary disease, unspecified: Secondary | ICD-10-CM | POA: Diagnosis not present

## 2019-08-31 DIAGNOSIS — I890 Lymphedema, not elsewhere classified: Secondary | ICD-10-CM | POA: Diagnosis not present

## 2019-09-01 ENCOUNTER — Ambulatory Visit: Payer: Self-pay

## 2019-09-01 NOTE — Chronic Care Management (AMB) (Signed)
  Care Management   Follow Up Note   09/01/2019 Name: Arshia Spellman MRN: [id number] DOB: 09/06/1965  Referred by: [name], PA-C Reason for referral : Care Coordination   Cheryl Chandler is a 54 y.o. year old female who is a primary care patient of [name], [place]. The care management team was consulted for assistance with care management and care coordination needs.    Review of patient status, including review of consultants reports, relevant laboratory and other test results, and collaboration with appropriate care team members and the patient's provider was performed as part of comprehensive patient evaluation and provision of chronic care management services.    LCSW completed CCM outreach attempt today but was unable to reach patient successfully. A HIPPA compliant voice message was left encouraging patient to return call once available. LCSW rescheduled CCM SW appointment as well.  A HIPPA compliant phone message was left for the patient providing contact information and requesting a return call.   [name], BSW, MSW, LCSW [facility] Care Management [place]  [facility] [email or web address] Phone: [phone]

## 2019-09-04 ENCOUNTER — Telehealth: Payer: Self-pay

## 2019-09-04 ENCOUNTER — Ambulatory Visit: Payer: Self-pay | Admitting: Licensed Clinical Social Worker

## 2019-09-04 NOTE — Chronic Care Management (AMB) (Signed)
Care Management   Follow Up Note   09/04/2019 Name: Cheryl Chandler MRN: [id number] DOB: 11-28-1965  Referred by: [name], PA-C Reason for referral : Care Coordination   Cheryl Chandler is a 54 y.o. year old female who is a primary care patient of [name], [place]. The care management team was consulted for assistance with care management and care coordination needs.    Review of patient status, including review of consultants reports, relevant laboratory and other test results, and collaboration with appropriate care team members and the patient's provider was performed as part of comprehensive patient evaluation and provision of chronic care management services.    SDOH (Social Determinants of Health) assessments performed: Yes See Care Plan activities for detailed interventions related to [name])     Advanced Directives: See Care Plan and Vynca application for related entries.   Goals Addressed    . SW-"I need to focus on taking better care of myself" (pt-stated)       Current Barriers:  . Financial constraints . Mental Health Concerns  . Social Isolation . Limited education about mental health support resources that are available to her within the local area* . Cognitive Deficits . Lacks knowledge of community resource: Grief support resources/programs that are offered free of charge  within the area  Clinical Social Work Clinical Goal(s):  [name] Over the next 90 days, client will work with SW to address concerns related to experiencing ongoing symptoms of depression, sadness and grief since the passing of her mother . Over the next 90 days, patient will work with LCSW to address needs related to implementing appropriate self-care and depression management.   Interventions: . Patient interviewed and appropriate assessments performed . Provided mental health counseling with regards to patient's loss and ongoing stressors. Patient denies wanting  LCSW to make a referral for grief counseling through [facility] again but was receptive to coping skill and resource education/information that was provided during session today. . Provided patient with information about managing depression within her daily life. Patient has ongoing grief symptoms as well. Patient reports that she talks out loud to her mother's picture and this is very helpful to her. Patient went to her mother's grave yesterday and put new flowers on it for Mother's Day.  . Provided reflective listening and implemented appropriate interventions to help suppport patient and her emotional needs  . Discussed plans with patient for ongoing care management follow up and provided patient with direct contact information for care management team . Advised patient to contact CCM team with any urgent case management needs. . Assisted patient/caregiver with obtaining information about health plan benefits . LCSW completed referral for family to have a wheelchair ramp built at their residence due to ongoing mobility concerns. Ramp has been successfully installed.  [name] PCS actively involved with patient and providing ongoing services.  [name] Positive reinforcement provided to patient for quitting smoking cigarettes. Coping skill review education provided for future triggers.  . Brief self-care education provided to both patient and spouse. . Patient confirms that she continues to go to food pantry for food insecurity. Resource information has been provided.  . Patient reports ongoing pain and issues with mobility. Patient shares that her pain resides in her pains and legs. Emotional support and coping skill education provided. . Patient confirms that her PCS aide continues to provide her with services. However, aide has to be reminded to put on her mask since patient is high risk. [name] LCSW reviewed  resources that [name] provided her with in the past which includes- . Housing Repair Assistance: . Asked  about the deed to their home in order to apply for [employer]. Patient stated that family member was not comfortable with that and that they would ask a family friend of [name] to help repair hole in floor of home.  . Energy Bill: . Pt stated that she had already paid her [employer] bill but they have very little leftover in the month. I asked her to please text her next month's bill to me so that we can apply for the [facility] fund to help assist. Will re-open new referral when received. . Social Security Disability: . Pt stated that her phone kept disconnecting when she tried to call SS disability # she said she will call again.  . Patient reports that she had a wonderful birthday last week spent with her family. Patient reports receiving appropriate socialization. Patient reports that her spouse took her out to eat and gifted her with several things which was a nice surprise.  . Patient confirms stable transportation arrangements for upcoming PCP appointment this week.   Patient Self Care Activities:  . Attends all scheduled provider appointments . Calls provider office for new concerns or questions  Please see past updates related to this goal by clicking on the "Past Updates" button in the selected goal      The care management team will reach out to the patient again over the next 45 days.   Eula Fried, BSW, MSW, [facility] Care Management [email or web address] Phone: [phone]

## 2019-09-06 ENCOUNTER — Encounter: Payer: Self-pay | Admitting: Family Medicine

## 2019-09-06 ENCOUNTER — Ambulatory Visit (INDEPENDENT_AMBULATORY_CARE_PROVIDER_SITE_OTHER): Payer: Medicaid Other | Admitting: Family Medicine

## 2019-09-06 ENCOUNTER — Other Ambulatory Visit: Payer: Self-pay

## 2019-09-06 VITALS — BP 130/66 | HR 73 | Temp 98.8°F

## 2019-09-06 DIAGNOSIS — R296 Repeated falls: Secondary | ICD-10-CM | POA: Diagnosis not present

## 2019-09-06 DIAGNOSIS — G8929 Other chronic pain: Secondary | ICD-10-CM

## 2019-09-06 DIAGNOSIS — M545 Low back pain, unspecified: Secondary | ICD-10-CM

## 2019-09-06 DIAGNOSIS — E785 Hyperlipidemia, unspecified: Secondary | ICD-10-CM

## 2019-09-06 DIAGNOSIS — R531 Weakness: Secondary | ICD-10-CM

## 2019-09-06 DIAGNOSIS — F331 Major depressive disorder, recurrent, moderate: Secondary | ICD-10-CM | POA: Diagnosis not present

## 2019-09-06 DIAGNOSIS — I890 Lymphedema, not elsewhere classified: Secondary | ICD-10-CM | POA: Diagnosis not present

## 2019-09-06 DIAGNOSIS — E559 Vitamin D deficiency, unspecified: Secondary | ICD-10-CM

## 2019-09-06 DIAGNOSIS — R7301 Impaired fasting glucose: Secondary | ICD-10-CM | POA: Diagnosis not present

## 2019-09-06 DIAGNOSIS — I10 Essential (primary) hypertension: Secondary | ICD-10-CM

## 2019-09-06 MED ORDER — TRAMADOL HCL 50 MG PO TABS: 50.0000 mg | ORAL_TABLET | Freq: Every day | ORAL | 0 refills | Status: DC | PRN

## 2019-09-06 NOTE — Assessment & Plan Note (Signed)
Will give small dose of tramadol as she's cut back on her meloxicam. 30 tabs should last 6 months. Pt aware if she's having to use more frequently that she will need to be referred to Pain Mgmt

## 2019-09-06 NOTE — Progress Notes (Signed)
BP 130/66 (BP Location: Right Arm, Patient Position: Sitting, Cuff Size: Normal)   Pulse 73   Temp 98.8 F (37.1 C) (Oral)   LMP 06/09/2017 (Approximate)   SpO2 97%    Subjective:    Patient ID: Cheryl Chandler, female    DOB: 1965-09-13, 54 y.o.   MRN: [id number]  HPI: Cheryl Chandler is a 54 y.o. female  Chief Complaint  Patient presents with  . Hypertension  . Back Pain    Requesting refill on Tramadol. Patient states she hasn't taken it in a while.   . Leg Pain   Presenting today for f/u several conditions.   HTN - Not able to check home BPs, but tolerating medications well without issue. Denies CP, significant SOB beyond baseline, dizziness, syncope. Swelling in legs doing some better, and legs less painful.   Still having significant back and leg pain, stopped meloxicam per Cardiology due to CV risk. Previously was on tramadol TID prn for her pain but has not had this in about a year. Does keep robaxin at home for prn use.   Has home health coming out to help out at home. Still doing home exercises but PT stopped coming out. PT was very helpful in keeping more active and able to perform ADLs so wanting to restart these services.   Exsessive thirst the past few months, dry mouth. No new medication changes.   Wanting help losing weight, can't exercise due to being mostly wheelchair bound, tries to reduce calorie intake but can't seem to get weight off.   Relevant past medical, surgical, family and social history reviewed and updated as indicated. Interim medical history since our last visit reviewed. Allergies and medications reviewed and updated.  Review of Systems  Per HPI unless specifically indicated above     Objective:    BP 130/66 (BP Location: Right Arm, Patient Position: Sitting, Cuff Size: Normal)   Pulse 73   Temp 98.8 F (37.1 C) (Oral)   LMP 06/09/2017 (Approximate)   SpO2 97%   Wt Readings from Last 3 Encounters:  06/30/19 (!) 329 lb  (149.2 kg)  05/03/19 (!) 329 lb (149.2 kg)  02/22/19 (!) 335 lb (152 kg)    Physical Exam Vitals and nursing note reviewed.  Constitutional:      Appearance: Normal appearance. She is obese. She is not ill-appearing.  HENT:     Head: Atraumatic.  Eyes:     Extraocular Movements: Extraocular movements intact.     Conjunctiva/sclera: Conjunctivae normal.  Cardiovascular:     Rate and Rhythm: Normal rate and regular rhythm.     Heart sounds: Normal heart sounds.  Pulmonary:     Effort: Pulmonary effort is normal.     Breath sounds: Normal breath sounds.  Musculoskeletal:     Cervical back: Normal range of motion and neck supple.     Comments: In wheelchair  Skin:    General: Skin is warm and dry.  Neurological:     Mental Status: She is alert and oriented to person, place, and time.  Psychiatric:        Mood and Affect: Mood normal.        Thought Content: Thought content normal.        Judgment: Judgment normal.     Results for orders placed or performed in visit on 09/06/19  Lipid Panel w/o Chol/HDL Ratio  Result Value Ref Range   Cholesterol, Total 136 100 - 199 mg/dL   Triglycerides 973  0 - 149 mg/dL   HDL 38 (L) >03 mg/dL   VLDL Cholesterol Cal 21 5 - 40 mg/dL   LDL Chol Calc (NIH) 77 0 - 99 mg/dL  Comprehensive metabolic panel  Result Value Ref Range   Glucose 130 (H) 65 - 99 mg/dL   BUN 13 6 - 24 mg/dL   Creatinine, Ser 5.00 0.57 - 1.00 mg/dL   GFR calc non Af Amer 94 >59 mL/min/1.73   GFR calc Af Amer 109 >59 mL/min/1.73   BUN/Creatinine Ratio 18 9 - 23   Sodium 141 134 - 144 mmol/L   Potassium 4.6 3.5 - 5.2 mmol/L   Chloride 101 96 - 106 mmol/L   CO2 25 20 - 29 mmol/L   Calcium 8.8 8.7 - 10.2 mg/dL   Total Protein 7.0 6.0 - 8.5 g/dL   Albumin 4.0 3.8 - 4.9 g/dL   Globulin, Total 3.0 1.5 - 4.5 g/dL   Albumin/Globulin Ratio 1.3 1.2 - 2.2   Bilirubin Total 0.3 0.0 - 1.2 mg/dL   Alkaline Phosphatase 80 39 - 117 IU/L   AST 12 0 - 40 IU/L   ALT 22 0 - 32  IU/L  VITAMIN D 25 Hydroxy (Vit-D Deficiency, Fractures)  Result Value Ref Range   Vit D, 25-Hydroxy 20.7 (L) 30.0 - 100.0 ng/mL  HgB A1c  Result Value Ref Range   Hgb A1c MFr Bld 6.1 (H) 4.8 - 5.6 %   Est. average glucose Bld gHb Est-mCnc 128 mg/dL      Assessment & Plan:   Problem List Items Addressed This Visit      Cardiovascular and Mediastinum   Essential hypertension    BPs stable and WNL, continue current regimen        Endocrine   IFG (impaired fasting glucose) - Primary    Start ozempic, work on lifestyle changes. Continue to monitor      Relevant Orders   HgB A1c (Completed)     Other   Clinical depression    Continue celexa, stable and under fairly good control      HLD (hyperlipidemia)    Recheck lipids, adjust if needed. Continue working on weight loss, lifestyle modifications      Relevant Orders   Lipid Panel w/o Chol/HDL Ratio (Completed)   Comprehensive metabolic panel (Completed)   Low back pain    Will give small dose of tramadol as she's cut back on her meloxicam. 30 tabs should last 6 months. Pt aware if she's having to use more frequently that she will need to be referred to Pain Mgmt      Relevant Medications   traMADol (ULTRAM) 50 MG tablet   Morbid obesity (HCC)    Has known IFG, will trial ozempic for BS benefits and potentially some weight loss benefits. Recheck in 1 month. Risks and precautions reviewed      Vitamin D deficiency    Recheck levels, continue supplementation      Relevant Orders   VITAMIN D 25 Hydroxy (Vit-D Deficiency, Fractures) (Completed)   Lymphedema    Some better with leg elevation, compression, med changes. Continue current regimen and monitoring       Other Visit Diagnoses    Frequent falls       Relevant Orders   Ambulatory referral to Home Health   Weakness       Home health orders placed for PT OT as needed   Relevant Orders   Ambulatory referral to Home Health  45 min spent today in  direct care, counseling, and coordination of care for patient  Follow up plan: Return in about 4 weeks (around 10/04/2019) for Weight f/u.

## 2019-09-07 DIAGNOSIS — I890 Lymphedema, not elsewhere classified: Secondary | ICD-10-CM | POA: Diagnosis not present

## 2019-09-07 DIAGNOSIS — J449 Chronic obstructive pulmonary disease, unspecified: Secondary | ICD-10-CM | POA: Diagnosis not present

## 2019-09-07 LAB — COMPREHENSIVE METABOLIC PANEL
ALT: 22 IU/L (ref 0–32)
AST: 12 IU/L (ref 0–40)
Albumin/Globulin Ratio: 1.3 (ref 1.2–2.2)
Albumin: 4 g/dL (ref 3.8–4.9)
Alkaline Phosphatase: 80 IU/L (ref 39–117)
BUN/Creatinine Ratio: 18 (ref 9–23)
BUN: 13 mg/dL (ref 6–24)
Bilirubin Total: 0.3 mg/dL (ref 0.0–1.2)
CO2: 25 mmol/L (ref 20–29)
Calcium: 8.8 mg/dL (ref 8.7–10.2)
Chloride: 101 mmol/L (ref 96–106)
Creatinine, Ser: 0.73 mg/dL (ref 0.57–1.00)
GFR calc Af Amer: 109 mL/min/{1.73_m2} (ref >59)
GFR calc non Af Amer: 94 mL/min/{1.73_m2} (ref >59)
Globulin, Total: 3 g/dL (ref 1.5–4.5)
Glucose: 130 mg/dL — ABNORMAL HIGH (ref 65–99)
Potassium: 4.6 mmol/L (ref 3.5–5.2)
Sodium: 141 mmol/L (ref 134–144)
Total Protein: 7 g/dL (ref 6.0–8.5)

## 2019-09-07 LAB — LIPID PANEL W/O CHOL/HDL RATIO
Cholesterol, Total: 136 mg/dL (ref 100–199)
HDL: 38 mg/dL — ABNORMAL LOW (ref >39)
LDL Chol Calc (NIH): 77 mg/dL (ref 0–99)
Triglycerides: 113 mg/dL (ref 0–149)
VLDL Cholesterol Cal: 21 mg/dL (ref 5–40)

## 2019-09-07 LAB — VITAMIN D 25 HYDROXY (VIT D DEFICIENCY, FRACTURES): Vit D, 25-Hydroxy: 20.7 ng/mL — ABNORMAL LOW (ref 30.0–100.0)

## 2019-09-07 LAB — HEMOGLOBIN A1C
Est. average glucose Bld gHb Est-mCnc: 128 mg/dL
Hgb A1c MFr Bld: 6.1 % — ABNORMAL HIGH (ref 4.8–5.6)

## 2019-09-08 ENCOUNTER — Telehealth: Payer: Self-pay | Admitting: Family Medicine

## 2019-09-08 NOTE — Telephone Encounter (Signed)
Called and left a message letting patient know that the labs have not been reviewed by Fleet Contras yet.

## 2019-09-08 NOTE — Telephone Encounter (Signed)
Copied from CRM [id number]. Topic: Quick Communication - Lab Results (Clinic Use ONLY) >> Sep 08, 2019 10:20 AM Maebelle Munroe wrote: Patient calling to inquire most recent lab results.

## 2019-09-11 ENCOUNTER — Telehealth: Payer: Self-pay | Admitting: Family Medicine

## 2019-09-11 ENCOUNTER — Other Ambulatory Visit: Payer: Self-pay | Admitting: Family Medicine

## 2019-09-11 MED ORDER — OZEMPIC (0.25 OR 0.5 MG/DOSE) 2 MG/1.5ML ~~LOC~~ SOPN: 0.2500 mg | PEN_INJECTOR | SUBCUTANEOUS | 0 refills | Status: DC

## 2019-09-11 NOTE — Telephone Encounter (Signed)
She does not need to be checking her blood sugars, she is only prediabetic at this point and the medication should bring things down to normal range.

## 2019-09-11 NOTE — Telephone Encounter (Signed)
PT calling does not have a meter to check bs. PT will need lancet/ testing strips  [facility] in [place]

## 2019-09-11 NOTE — Telephone Encounter (Signed)
See result note.  

## 2019-09-11 NOTE — Telephone Encounter (Signed)
Called and left a detailed message for patient.  

## 2019-09-11 NOTE — Telephone Encounter (Signed)
Pt called this morning to check status of lab results/ please advise

## 2019-09-12 ENCOUNTER — Telehealth: Payer: Self-pay

## 2019-09-12 ENCOUNTER — Telehealth: Payer: Self-pay | Admitting: Family Medicine

## 2019-09-12 NOTE — Telephone Encounter (Signed)
  Community Resource Referral   KNB 09/12/2019  1st Attempt LMTCB  [facility]  Name: Cheryl Chandler   MRN: [id number]   DOB: 1966/01/06   AGE: 54 y.o.   GENDER: female   PCP [name], PA-C.   Called pt regarding Community Resource Referral LMTCB Follow up on: 09/13/2019  [name]  Care Guide . Embedded Care Coordination [facility] Management [email or web address]  [phone]

## 2019-09-12 NOTE — Telephone Encounter (Signed)
Patient is requesting call back from CMA, as patient states she was told by pharmacy that her diabetic supplies were not covered.

## 2019-09-12 NOTE — Telephone Encounter (Signed)
Patient notified that she does not need to check her blood sugar.

## 2019-09-12 NOTE — Telephone Encounter (Signed)
Prior Authorization initiated via [employer] for [employer] Confirmation #: [id number]  PA Approved

## 2019-09-12 NOTE — Telephone Encounter (Signed)
Patient would like to know when you need her to come back in for her next visit.

## 2019-09-13 NOTE — Telephone Encounter (Signed)
  Community Resource Referral   KNB 09/13/2019  2ND Attempt LMTCB   Name: Cheryl Chandler   MRN: [id number]   DOB: Dec 04, 1965   AGE: 54 y.o.   GENDER: female   PCP [name], PA-C.   Called pt regarding Community Resource Referral LMTCB Follow up on: 09/14/2019  [facility]  Care Guide . Embedded Care Coordination [facility] Management [email or web address]  [phone]

## 2019-09-14 ENCOUNTER — Telehealth: Payer: Self-pay | Admitting: Family Medicine

## 2019-09-14 DIAGNOSIS — J449 Chronic obstructive pulmonary disease, unspecified: Secondary | ICD-10-CM | POA: Diagnosis not present

## 2019-09-14 DIAGNOSIS — I890 Lymphedema, not elsewhere classified: Secondary | ICD-10-CM | POA: Diagnosis not present

## 2019-09-14 NOTE — Assessment & Plan Note (Signed)
Recheck levels, continue supplementation

## 2019-09-14 NOTE — Telephone Encounter (Signed)
  Community Resource Referral   KNB 09/14/2019  3rd Attempt NO ANSWER  DOB: October 06, 1965   AGE: 54 y.o.   GENDER: female   PCP [name], PA-C.   Called pt regarding Community Resource Referral [facility] Closing referral pending any other needs of patient. KNB  [name]  Care Guide . Embedded Care Coordination [facility] Management [email or web address]  [phone]

## 2019-09-14 NOTE — Assessment & Plan Note (Signed)
Recheck lipids, adjust if needed. Continue working on weight loss, lifestyle modifications

## 2019-09-14 NOTE — Telephone Encounter (Signed)
Copied from CRM [id number]. Topic: General - Other >> Sep 14, 2019  2:28 PM [name] wrote: Kathlene November with [facility] : [facility] states they are unable to see patient for [facility] PT due to staff constraints.

## 2019-09-14 NOTE — Telephone Encounter (Signed)
Called pt to let her know of Rachel's message, no answer, left vm  °

## 2019-09-14 NOTE — Assessment & Plan Note (Signed)
Start ozempic, work on lifestyle changes. Continue to monitor

## 2019-09-14 NOTE — Assessment & Plan Note (Signed)
Some better with leg elevation, compression, med changes. Continue current regimen and monitoring

## 2019-09-14 NOTE — Telephone Encounter (Signed)
1 month if she was able to start the ozempic

## 2019-09-14 NOTE — Assessment & Plan Note (Signed)
Has known IFG, will trial ozempic for BS benefits and potentially some weight loss benefits. Recheck in 1 month. Risks and precautions reviewed

## 2019-09-14 NOTE — Assessment & Plan Note (Signed)
BPs stable and WNL, continue current regimen 

## 2019-09-14 NOTE — Assessment & Plan Note (Signed)
Continue celexa, stable and under fairly good control

## 2019-09-18 NOTE — Telephone Encounter (Signed)
Called and left a message letting patient know that Fleet Contras wanted to see her in 1 month.

## 2019-09-20 NOTE — Telephone Encounter (Signed)
Lvm to make this apt. 

## 2019-09-21 DIAGNOSIS — I890 Lymphedema, not elsewhere classified: Secondary | ICD-10-CM | POA: Diagnosis not present

## 2019-09-21 DIAGNOSIS — J449 Chronic obstructive pulmonary disease, unspecified: Secondary | ICD-10-CM | POA: Diagnosis not present

## 2019-09-22 ENCOUNTER — Ambulatory Visit: Payer: Self-pay | Admitting: Pharmacist

## 2019-09-22 ENCOUNTER — Telehealth: Payer: Self-pay | Admitting: Family Medicine

## 2019-09-22 ENCOUNTER — Other Ambulatory Visit: Payer: Self-pay | Admitting: Family Medicine

## 2019-09-22 DIAGNOSIS — R531 Weakness: Secondary | ICD-10-CM

## 2019-09-22 DIAGNOSIS — M545 Low back pain, unspecified: Secondary | ICD-10-CM

## 2019-09-22 DIAGNOSIS — R7301 Impaired fasting glucose: Secondary | ICD-10-CM

## 2019-09-22 DIAGNOSIS — G629 Polyneuropathy, unspecified: Secondary | ICD-10-CM

## 2019-09-22 NOTE — Patient Instructions (Signed)
Visit Information  Goals Addressed            This Visit's Progress     Patient Stated   . PharmD "I have depression, anxiety, and chronic pain" (pt-stated)       CARE PLAN ENTRY (see longtitudinal plan of care for additional care plan information)  Current Barriers:  . Polypharmacy; complex patient with multiple comorbidities including HTN, venous insufficiency, COPD, peripheral neuropathy, depression/anxiety, arthritis.  o Notes today that Ozempic wasn't covered by Medicaid  o Notes problems with home - floor still needs to be fixed from where a hot water heater broke. Would like information for community resources to help w/ this o Notes that the agency sent a new [facility] RN, [place], who comes one day per week. They get along well . Self-manages medications by using a pill box o Pre-diabetes/weight management: A1c 6.1%, prescribed Ozempic. Medicaid generally doesn't cover, but documentation notes that PA was approved. Patient notes that she was unaware of this.  o HTN/ASCVD risk reduction: lisinopril 40 mg daily; simvastatin 40 mg daily; follows w/ Dr. Milta Deiters o Venous insufficiency: furosemide 40 mg daily; follows w/ Vascular and continues to use lymphedema pump per Dr. Gilda Crease  o COPD/Asthma: montelukast 10 mg daily; Spiriva 18 mcg daily; Dulera 200/50 mcg BID, albuterol nebulizer PRN, usually daily recently. o Peripheral neuropathy/pain: gabapentin 300 mg, meloxicam 15 mg, methocarbamol 750 mg daily o Depression/anxiety: citalopram 20 mg daily  - Notes it has now been ~1 year since her mother passed. Is struggling with this recently.  o Hypothyroid: levothyroxine 50 mcg o GERD: omeprazole 20 mg daily  Pharmacist Clinical Goal(s):  [name] Over the next 90 days, patient will work with PharmD and provider towards optimized medication management  Interventions: . Comprehensive medication review performed, medication list updated in electronic medical record . Inter-disciplinary care team  collaboration (see longitudinal plan of care) . Encouraged to call [facility] and report that PA for Ozempic appears to have been approved, and to try running prescription again. She verbalizes understanding . Will place Care Guide referral for support with housing needs, as well as patient's confusion about her disability application  Patient Self Care Activities:  . Patient will take medications as prescribed . Patient will schedule f/u with specialty providers and attend these appointments  Please see past updates related to this goal by clicking on the "Past Updates" button in the selected goal         Patient verbalizes understanding of instructions provided today.   Plan:  - Will outreach as previously scheduled  Catie Feliz Beam, PharmD, [facility] Clinical Pharmacist [facility] [phone]

## 2019-09-22 NOTE — Telephone Encounter (Signed)
I think she just needs someone to explain Ozempic dosing to her - 0.25 mg weekly for 4 weeks then 0.5 mg weekly. Can someone call and review this?

## 2019-09-22 NOTE — Chronic Care Management (AMB) (Signed)
Chronic Care Management   Follow Up Note   09/22/2019 Name: Cheryl Chandler MRN: [id number] DOB: [birth year]  Referred by: [name], PA-C Reason for referral : Chronic Care Management (Medication Management)   Cheryl Chandler is a 54 y.o. year old female who is a primary care patient of [name], PA-C. The CCM team was consulted for assistance with chronic disease management and care coordination needs.  Contacted patient for medication management review.     Review of patient status, including review of consultants reports, relevant laboratory and other test results, and collaboration with appropriate care team members and the patient's provider was performed as part of comprehensive patient evaluation and provision of chronic care management services.    SDOH (Social Determinants of Health) assessments performed: Yes See Care Plan activities for detailed interventions related to [facility])     Outpatient Encounter Medications as of 09/22/2019  Medication Sig Note  . albuterol (PROVENTIL) (2.5 MG/3ML) 0.083% nebulizer solution INHALE CONTENTS OF 1 VIAL IN NEBULIZER EVERY 4 HOURS IF NEEDED FOR WHEEZING OR SHORTNESS OF BREATH.   . citalopram (CELEXA) 20 MG tablet Take 1 tablet (20 mg total) by mouth daily.   [name] EPINEPHrine 0.3 mg/0.3 mL IJ SOAJ injection USE AS DIRECTED. (Patient not taking: Reported on 09/06/2019) 09/06/2019: Has if needed  . furosemide (LASIX) 40 MG tablet Take 1 tablet (40 mg total) by mouth daily.   [name] gabapentin (NEURONTIN) 300 MG capsule Take 1 capsule (300 mg total) by mouth 3 (three) times daily as needed.   [name] levothyroxine (SYNTHROID) 50 MCG tablet Take 1 tablet (50 mcg total) by mouth daily.   [name] lisinopril (ZESTRIL) 40 MG tablet TAKE 1 TABLET BY MOUTH ONCE DAILY   . meloxicam (MOBIC) 15 MG tablet TAKE 1 TABLET BY MOUTH ONCE A DAY WITH FOOD OR MILK IF NEEDED (Patient not taking: Reported on 09/06/2019)   . methocarbamol (ROBAXIN) 750  MG tablet TAKE 1-2 TABLETS BY MOUTH 3 TIMES DAILY AS NEEDED 08/23/2019: Usually 2 doses daily  . mometasone-formoterol (DULERA) 100-5 MCG/ACT AERO INHALE 2 PUFFS BY MOUTH TWICE A DAY. RINSE MOUTH AFTER USE.   . montelukast (SINGULAIR) 10 MG tablet Take 1 tablet (10 mg total) by mouth daily.   . nitroGLYCERIN (NITROSTAT) 0.4 MG SL tablet DISSOLVE 1 TABLET UNDER TONGUE AT ONSET OF CHEST PAIN. REPEAT IN 5 MIN IF NOT RESOLVED, MAX 3 DOSES. 911 IF NEEDED.   [name] omeprazole (PRILOSEC) 20 MG capsule Take 1 capsule (20 mg total) by mouth daily.   . Semaglutide,0.25 or 0.5MG /DOS, (OZEMPIC, 0.25 OR 0.5 MG/DOSE,) 2 MG/1.5ML SOPN Inject 0.25 mg into the skin once a week.   . simvastatin (ZOCOR) 40 MG tablet Take 1 tablet (40 mg total) by mouth daily.   [name] tiotropium (SPIRIVA HANDIHALER) 18 MCG inhalation capsule Place 1 capsule (18 mcg total) into inhaler and inhale daily.   . traMADol (ULTRAM) 50 MG tablet Take 1 tablet (50 mg total) by mouth daily as needed.   . triamcinolone cream (KENALOG) 0.1 % Apply 1 application topically 2 (two) times daily.   . Vitamin D, Ergocalciferol, (DRISDOL) 1.25 MG (50000 UT) CAPS capsule Take 1 capsule (50,000 Units total) by mouth every 7 (seven) days.    No facility-administered encounter medications on file as of 09/22/2019.     Objective:   Goals Addressed            This Visit's Progress     Patient Stated   . PharmD "  I have depression, anxiety, and chronic pain" (pt-stated)       CARE PLAN ENTRY (see longtitudinal plan of care for additional care plan information)  Current Barriers:  . Polypharmacy; complex patient with multiple comorbidities including HTN, venous insufficiency, COPD, peripheral neuropathy, depression/anxiety, arthritis.  o Notes today that Ozempic wasn't covered by Medicaid  o Notes problems with home - floor still needs to be fixed from where a hot water heater broke. Would like information for community resources to help w/ this o Notes that  the agency sent a new [facility] RN, [name], who comes one day per week. They get along well . Self-manages medications by using a pill box o Pre-diabetes/weight management: A1c 6.1%, prescribed Ozempic. Medicaid generally doesn't cover, but documentation notes that PA was approved. Patient notes that she was unaware of this.  o HTN/ASCVD risk reduction: lisinopril 40 mg daily; simvastatin 40 mg daily; follows w/ Dr. Laurelyn Sickle o Venous insufficiency: furosemide 40 mg daily; follows w/ Vascular and continues to use lymphedema pump per Dr. Delana Meyer  o COPD/Asthma: montelukast 10 mg daily; Spiriva 18 mcg daily; Dulera 200/50 mcg BID, albuterol nebulizer PRN, usually daily recently. o Peripheral neuropathy/pain: gabapentin 300 mg, meloxicam 15 mg, methocarbamol 750 mg daily o Depression/anxiety: citalopram 20 mg daily  - Notes it has now been ~1 year since her mother passed. Is struggling with this recently.  o Hypothyroid: levothyroxine 50 mcg o GERD: omeprazole 20 mg daily  Pharmacist Clinical Goal(s):  [name] Over the next 90 days, patient will work with PharmD and provider towards optimized medication management  Interventions: . Comprehensive medication review performed, medication list updated in electronic medical record . Inter-disciplinary care team collaboration (see longitudinal plan of care) . Encouraged to call [facility] and report that PA for Ozempic appears to have been approved, and to try running prescription again. She verbalizes understanding . Will place Care Guide referral for support with housing needs, as well as patient's confusion about her disability application  Patient Self Care Activities:  . Patient will take medications as prescribed . Patient will schedule f/u with specialty providers and attend these appointments  Please see past updates related to this goal by clicking on the "Past Updates" button in the selected goal          Plan:  - Will outreach as previously  scheduled  [name], PharmD, [place] [phone]

## 2019-09-22 NOTE — Telephone Encounter (Signed)
Called and left a message for patient to call back .

## 2019-09-22 NOTE — Telephone Encounter (Signed)
Thank you so much for trying!

## 2019-09-22 NOTE — Telephone Encounter (Signed)
Copied from CRM [id number]. Topic: General - Other >> Sep 22, 2019  1:03 PM [name] wrote: Patient requesting to speak with Catie to discuss insulin. Requesting call back.

## 2019-09-22 NOTE — Telephone Encounter (Signed)
Called 5 different Home Health Agencies. No one was able to take medicaid pt because medicaid only covers 3 visits per year. 1st visit is consultation and last visit is a discharge. That would only leave one visit for Home health. Called pt and let her know this on voicemail and asking for a call back to see if she would like to do something else.

## 2019-09-26 NOTE — Telephone Encounter (Signed)
Spoke with patient. Patient had some confusion at the end of the pen where it rotates.  Explained to patient to keep turning until she saw a number. Patient did and understood. Told her to call back with any other questions or concerns.

## 2019-09-26 NOTE — Telephone Encounter (Signed)
Patient is returning Tiffanys phone call. Call back [phone]

## 2019-10-10 ENCOUNTER — Telehealth: Payer: Self-pay

## 2019-10-10 NOTE — Telephone Encounter (Signed)
Copied from CRM [id number]. Topic: Referral - Status >> Oct 10, 2019  3:02 PM Ricarda Frame D wrote: 10/10/19 Left message on voicemail for patient to return my call regarding home repairs and disability. Olean Ree [phone]

## 2019-10-11 ENCOUNTER — Ambulatory Visit: Payer: Self-pay | Admitting: Family Medicine

## 2019-10-12 ENCOUNTER — Ambulatory Visit: Payer: Self-pay | Admitting: Family Medicine

## 2019-10-12 ENCOUNTER — Telehealth: Payer: Self-pay

## 2019-10-12 NOTE — Telephone Encounter (Signed)
Copied from CRM [id number]. Topic: Referral - Status >> Oct 12, 2019 10:20 AM Ricarda Frame D wrote: 10/12/19 Spoke with patient about  information for [facility] Counselor and Social Security Disability. Olean Ree [phone]

## 2019-10-17 ENCOUNTER — Telehealth: Payer: Self-pay

## 2019-10-17 NOTE — Telephone Encounter (Signed)
Copied from CRM [id number]. Topic: Referral - Status >> Oct 17, 2019  2:50 PM Ricarda Frame D wrote: 10/17/19 Spoke with patient she has not been feeling well so has not called any of the resources,  she has also had a death in the family,  plans to call within the next few days.  Will follow-up with her by 10/23/19. Olean Ree [phone]

## 2019-10-18 ENCOUNTER — Telehealth: Payer: Self-pay

## 2019-10-23 ENCOUNTER — Telehealth: Payer: Self-pay

## 2019-10-23 NOTE — Telephone Encounter (Signed)
Copied from CRM [id number]. Topic: Referral - Status >> Oct 23, 2019 12:34 PM Ricarda Frame D wrote: 10/23/19 Left message for patient to return my call regarding home repair resources, disability resource.  Olean Ree [phone]

## 2019-10-26 DIAGNOSIS — Z419 Encounter for procedure for purposes other than remedying health state, unspecified: Secondary | ICD-10-CM | POA: Diagnosis not present

## 2019-11-02 ENCOUNTER — Telehealth: Payer: Self-pay

## 2019-11-02 NOTE — Telephone Encounter (Signed)
Copied from CRM [id number]. Topic: Referral - Status >> Nov 02, 2019  1:31 PM Ricarda Frame D wrote: 11/02/19 Spoke with patient she has contacted Social Security Disability and resources given for home Psychologist, forensic.  Closing referral.  Olean Ree [phone]

## 2019-11-06 ENCOUNTER — Other Ambulatory Visit: Payer: Self-pay | Admitting: Family Medicine

## 2019-11-07 ENCOUNTER — Other Ambulatory Visit: Payer: Self-pay | Admitting: Family Medicine

## 2019-11-07 DIAGNOSIS — M62838 Other muscle spasm: Secondary | ICD-10-CM

## 2019-11-07 NOTE — Telephone Encounter (Signed)
Requested medication (s) are due for refill today: Yes  Requested medication (s) are on the active medication list: Yes  Last refill:  Robaxin 08/18/19   Spiriva 10/12/18  Future visit scheduled: Yes  Notes to clinic:  Spiriva has expired. Robaxin is not delegated.    Requested Prescriptions  Pending Prescriptions Disp Refills   methocarbamol (ROBAXIN) 750 MG tablet [Pharmacy Med Name: METHOCARBAMOL 750 MG TAB] 90 tablet 0    Sig: TAKE 1-2 TABLETS BY MOUTH 3 TIMES DAILY AS NEEDED      Not Delegated - Analgesics:  Muscle Relaxants Failed - 11/07/2019 12:56 PM      Failed - This refill cannot be delegated      Passed - Valid encounter within last 6 months    Recent Outpatient Visits           2 months ago IFG (impaired fasting glucose)   [facility], [place], [place]   4 months ago Upper respiratory tract infection, unspecified type   [facility], [place], [place]   6 months ago Essential hypertension   [facility] Roosvelt Maser [place], [place]   8 months ago Rash   [facility] Roosvelt Maser [place], [place]   9 months ago Essential hypertension   [facility] Roosvelt Maser [place], [place]                SPIRIVA HANDIHALER 18 MCG inhalation capsule [Pharmacy Med Name: SPIRIVA HANDIHALER 18 MCG INH CAP] 30 capsule 12    Sig: INHALE CONTENTS OF 1 CAPSULE ONCE DAILY      Pulmonology:  Anticholinergic Agents Passed - 11/07/2019 12:56 PM      Passed - Valid encounter within last 12 months    Recent Outpatient Visits           2 months ago IFG (impaired fasting glucose)   [facility], [place], [place]   4 months ago Upper respiratory tract infection, unspecified type   [facility] [place], [place], [place]   6 months ago Essential hypertension   [facility] Roosvelt Maser [place], [place]   8 months ago Rash   [facility] Roosvelt Maser [place], [place]   9 months ago Essential hypertension   [facility] Roosvelt Maser [place], [place]

## 2019-11-07 NOTE — Telephone Encounter (Signed)
Requested medication (s) are due for refill today - yes   Requested medication (s) are on the active medication list -yes  Future visit scheduled -yes  Last refill: 09/12/19  Notes to clinic: Request for non delegated Rx  Requested Prescriptions  Pending Prescriptions Disp Refills   traMADol (ULTRAM) 50 MG tablet [Pharmacy Med Name: TRAMADOL HCL 50 MG TAB] 30 tablet     Sig: TAKE 1 TABLET BY MOUTH ONCE DAILY AS NEEDED      Not Delegated - Analgesics:  Opioid Agonists Failed - 11/07/2019 12:36 PM      Failed - This refill cannot be delegated      Failed - Urine Drug Screen completed in last 360 days.      Passed - Valid encounter within last 6 months    Recent Outpatient Visits           2 months ago IFG (impaired fasting glucose)   [facility], [place], [place]   4 months ago Upper respiratory tract infection, unspecified type   [facility] Roosvelt Maser [place], [place]   6 months ago Essential hypertension   [facility] Roosvelt Maser [place], [place]   8 months ago Rash   [facility] Roosvelt Maser [place], [place]   9 months ago Essential hypertension   [facility] Roosvelt Maser [place], [place]                  Requested Prescriptions  Pending Prescriptions Disp Refills   traMADol (ULTRAM) 50 MG tablet [Pharmacy Med Name: TRAMADOL HCL 50 MG TAB] 30 tablet     Sig: TAKE 1 TABLET BY MOUTH ONCE DAILY AS NEEDED      Not Delegated - Analgesics:  Opioid Agonists Failed - 11/07/2019 12:36 PM      Failed - This refill cannot be delegated      Failed - Urine Drug Screen completed in last 360 days.      Passed - Valid encounter within last 6 months    Recent Outpatient Visits           2 months ago IFG (impaired fasting glucose)   [facility], [place], [place]   4 months ago Upper respiratory tract infection, unspecified type   [facility] [place], [place], [place]   6 months ago Essential hypertension   [facility] Roosvelt Maser [place], [place]   8 months ago Rash   [facility] Roosvelt Maser [place], [place]   9 months ago Essential hypertension   [facility] Roosvelt Maser [place], [place]

## 2019-11-08 ENCOUNTER — Encounter: Payer: Self-pay | Admitting: Family Medicine

## 2019-11-08 ENCOUNTER — Ambulatory Visit (INDEPENDENT_AMBULATORY_CARE_PROVIDER_SITE_OTHER): Payer: Medicaid Other | Admitting: Family Medicine

## 2019-11-08 DIAGNOSIS — F5101 Primary insomnia: Secondary | ICD-10-CM

## 2019-11-08 DIAGNOSIS — J029 Acute pharyngitis, unspecified: Secondary | ICD-10-CM | POA: Diagnosis not present

## 2019-11-08 MED ORDER — QUETIAPINE FUMARATE 25 MG PO TABS
25.0000 mg | ORAL_TABLET | Freq: Every day | ORAL | 0 refills | Status: DC
Start: 2019-11-08 — End: 2019-12-06

## 2019-11-08 NOTE — Progress Notes (Signed)
LMP 06/09/2017 (Approximate)    Subjective:    Patient ID: Cheryl Chandler, female    DOB: 1965/12/31, 54 y.o.   MRN: [id number]  HPI: Cheryl Chandler is a 54 y.o. female  Chief Complaint  Patient presents with  . Weight Check  . Fatigue    x about 3 days  . Generalized Body Aches  . Sore Throat    . This visit was completed via telephone due to the restrictions of the COVID-19 pandemic. All issues as above were discussed and addressed. Physical exam was done as above through visual confirmation on telephone. If it was felt that the patient should be evaluated in the office, they were directed there. The patient verbally consented to this visit. . Location of the patient: home . Location of the provider: work . Those involved with this call:  . Provider: Roosvelt Maser, PA-C . CMA: Elton Sin, CMA . Front Desk/Registration: Adela Ports  . Time spent on call: 25 minutes on the phone discussing health concerns. 5 minutes total spent in review of patient's record and preparation of their chart. I verified patient identity using two factors (patient name and date of birth). Patient consents verbally to being seen via telemedicine visit today.   Here today for weight follow up after starting ozempic. Tolerating the medication well so far, no side effects reported. Does not have a scale at home so not sure how much weight she's lost to this point. Has been eating less due to decreased appetite since starting medication. Denies N/V, abdominal pain.   Gets about 1 hour at night of sleep, states she's always been this way. Tries to nap during the day sometimes but typically unable. Has not tried medication for this anytime recently. Notes this is very bothersome to her.   Malaise, sore throat, hoarseness, generalized aches - feels much of this is related to how hot her house is without good AC. She states the heat exacerbates her allergies so despite regular use of  medications for allergies they remain under poor control. Denies fever, CP, SOB, sick contacts.   Depression screen [facility] 2/9 05/03/2019 09/30/2018  Decreased Interest 3 3  Down, Depressed, Hopeless 3 3  PHQ - 2 Score 6 6  Altered sleeping 3 3  Tired, decreased energy 3 3  Change in appetite 2 0  Feeling bad or failure about yourself  2 0  Trouble concentrating 3 0  Moving slowly or fidgety/restless 3 3  Suicidal thoughts 0 0  PHQ-9 Score 22 15  Difficult doing work/chores Somewhat difficult Not difficult at all   GAD 7 : Generalized Anxiety Score 05/03/2019 09/30/2018  Nervous, Anxious, on Edge 2 0  Control/stop worrying 3 3  Worry too much - different things 3 3  Trouble relaxing 3 3  Restless 0 0  Easily annoyed or irritable 2 3  Afraid - awful might happen 1 0  Total GAD 7 Score 14 12  Anxiety Difficulty Somewhat difficult Not difficult at all     Relevant past medical, surgical, family and social history reviewed and updated as indicated. Interim medical history since our last visit reviewed. Allergies and medications reviewed and updated.  Review of Systems  Per HPI unless specifically indicated above     Objective:    LMP 06/09/2017 (Approximate)   Wt Readings from Last 3 Encounters:  06/30/19 (!) 329 lb (149.2 kg)  05/03/19 (!) 329 lb (149.2 kg)  02/22/19 (!) 335 lb (152 kg)  Physical Exam  Unable to perform PE due to patient lack of access to video technology for today's visit  Results for orders placed or performed in visit on 09/06/19  Lipid Panel w/o Chol/HDL Ratio  Result Value Ref Range   Cholesterol, Total 136 100 - 199 mg/dL   Triglycerides 841 0 - 149 mg/dL   HDL 38 (L) >32 mg/dL   VLDL Cholesterol Cal 21 5 - 40 mg/dL   LDL Chol Calc (NIH) 77 0 - 99 mg/dL  Comprehensive metabolic panel  Result Value Ref Range   Glucose 130 (H) 65 - 99 mg/dL   BUN 13 6 - 24 mg/dL   Creatinine, Ser 4.40 0.57 - 1.00 mg/dL   GFR calc non Af Amer 94 >59 mL/min/1.73    GFR calc Af Amer 109 >59 mL/min/1.73   BUN/Creatinine Ratio 18 9 - 23   Sodium 141 134 - 144 mmol/L   Potassium 4.6 3.5 - 5.2 mmol/L   Chloride 101 96 - 106 mmol/L   CO2 25 20 - 29 mmol/L   Calcium 8.8 8.7 - 10.2 mg/dL   Total Protein 7.0 6.0 - 8.5 g/dL   Albumin 4.0 3.8 - 4.9 g/dL   Globulin, Total 3.0 1.5 - 4.5 g/dL   Albumin/Globulin Ratio 1.3 1.2 - 2.2   Bilirubin Total 0.3 0.0 - 1.2 mg/dL   Alkaline Phosphatase 80 39 - 117 IU/L   AST 12 0 - 40 IU/L   ALT 22 0 - 32 IU/L  VITAMIN D 25 Hydroxy (Vit-D Deficiency, Fractures)  Result Value Ref Range   Vit D, 25-Hydroxy 20.7 (L) 30.0 - 100.0 ng/mL  HgB A1c  Result Value Ref Range   Hgb A1c MFr Bld 6.1 (H) 4.8 - 5.6 %   Est. average glucose Bld gHb Est-mCnc 128 mg/dL      Assessment & Plan:   Problem List Items Addressed This Visit      Other   Insomnia - Primary    Trial seroquel, sleep hygiene. Recheck 2 weeks      Morbid obesity (HCC)    Continue ozempic regimen, recheck in person in 2 weeks to measure weight and labs Discussed diet and exercise as tolerated       Other Visit Diagnoses    Sore throat       Suspect allergies vs environmental with lack of good AC and lots of allergens in environment. Continue allergy regimen, work with SW for resources       Follow up plan: Return in about 2 weeks (around 11/22/2019) for Sleep, weight check.

## 2019-11-09 NOTE — Telephone Encounter (Signed)
Per PCP's note, patient would need pain management referral if Tramadol did not last 6 months, it looks like it lasted about 2 months.  Please ask patient if she is okay with pain management referral; does she have any preference on provider or location?

## 2019-11-09 NOTE — Telephone Encounter (Signed)
Patient notified. Patient stated that she still have some tramadol so she did not requested a refill. Patient stated she will call us back when she is ready to get the referral for the pain management clinic. FYI

## 2019-11-10 ENCOUNTER — Ambulatory Visit: Payer: Self-pay | Admitting: Licensed Clinical Social Worker

## 2019-11-10 NOTE — Chronic Care Management (AMB) (Signed)
Care Management   Follow Up Note   11/10/2019 Name: Cheryl Chandler MRN: [id number] DOB: [birth year]  Referred by: [name], PA-C Reason for referral : Care Coordination   Cheryl Chandler is a 54 y.o. year old female who is a primary care patient of Particia Chandler, [place]. The care management team was consulted for assistance with care management and care coordination needs.    Review of patient status, including review of consultants reports, relevant laboratory and other test results, and collaboration with appropriate care team members and the patient's provider was performed as part of comprehensive patient evaluation and provision of chronic care management services.    SDOH (Social Determinants of Health) assessments performed: Yes See Care Plan activities for detailed interventions related to [facility])     Advanced Directives: See Care Plan and Vynca application for related entries.   Goals Addressed    .  SW-"I need to focus on taking better care of myself" (pt-stated)        Current Barriers:  . Financial constraints . Mental Health Concerns  . Social Isolation . Limited education about mental health support resources that are available to her within the local area* . Cognitive Deficits . Lacks knowledge of community resource: Grief support resources/programs that are offered free of charge  within the area  Clinical Social Work Clinical Goal(s):  Cheryl Chandler Over the next 90 days, client will work with SW to address concerns related to experiencing ongoing symptoms of depression, sadness and grief since the passing of her mother . Over the next 90 days, patient will work with LCSW to address needs related to implementing appropriate self-care and depression management.   Interventions: . Patient interviewed and appropriate assessments performed . Provided mental health counseling with regards to patient's loss and ongoing stressors. Patient denies  wanting LCSW to make a referral for grief counseling through [facility] again but was receptive to coping skill and resource education/information that was provided during session today. . Provided patient with information about managing depression within her daily life. Patient has ongoing grief symptoms as well. Patient reports that she talks out loud to her mother's picture and this is very helpful to her. Grief support resource education provided again during session today.  . Provided reflective listening and implemented appropriate interventions to help suppport patient and her emotional needs  . Discussed plans with patient for ongoing care management follow up and provided patient with direct contact information for care management team . Advised patient to contact CCM team with any urgent case management needs. . Assisted patient/caregiver with obtaining information about health plan benefits . LCSW completed referral for family to have a wheelchair ramp built at their residence due to ongoing mobility concerns. Ramp has been successfully installed.  Cheryl Chandler PCS actively involved with patient and providing ongoing services.  Cheryl Chandler Positive reinforcement provided to patient for quitting smoking cigarettes. Coping skill review education provided for future triggers.  . Brief self-care education provided to both patient and spouse. . Patient confirms that she continues to go to food pantry for food insecurity. Resource information has been provided.  . Patient reports ongoing pain and issues with mobility. Patient shares that her pain resides in her pains and legs. Emotional support and coping skill education provided. . Patient confirms that her PCS aide continues to provide her with services. However, aide has to be reminded to put on her mask since patient is high risk. Cheryl Kitchen LCSW reviewed resources that [name] provided  her with in the past which includes- . Housing Repair Assistance: . Asked about the  deed to their home in order to apply for [employer]. Patient stated that family member was not comfortable with that and that they would ask a family friend of [place] to help repair hole in floor of home.  . Energy Bill: . Pt stated that she had already paid her [employer] bill but they have very little leftover in the month. I asked her to please text her next month's bill to me so that we can apply for the [facility] fund to help assist. Will re-open new referral when received. . Social Security Disability: Pt stated that her phone kept disconnecting when she tried to call SS disability # she said she will call again.  . Patient reports that she had a wonderful birthday spent with her family. Patient reports receiving appropriate socialization. Patient reports that her spouse took her out to eat and gifted her with several things which was a nice surprise.  . Patient confirms stable transportation arrangements for upcoming PCP appointment this week.  . Patient repots that she is losing a lot of weight but is not sure how much she has lost as she does not have a scale. Patient reports that a goal of hers is to lose weight but she has not been intentionally losing weight at this time.  . Patient reports that her aide [place] continues to assist her with PCS and is an excellent support within the home for her and spouse.   Patient Self Care Activities:  . Attends all scheduled provider appointments . Calls provider office for new concerns or questions  Please see past updates related to this goal by clicking on the "Past Updates" button in the selected goal      The care management team will reach out to the patient again over the next quarter.  Cheryl Chandler, BSW, MSW, LCSW [facility] [place]  [facility] [email or web address] Phone: [phone]

## 2019-11-13 NOTE — Assessment & Plan Note (Signed)
Continue ozempic regimen, recheck in person in 2 weeks to measure weight and labs Discussed diet and exercise as tolerated

## 2019-11-13 NOTE — Assessment & Plan Note (Signed)
Trial seroquel, sleep hygiene. Recheck 2 weeks

## 2019-11-15 ENCOUNTER — Telehealth: Payer: Self-pay | Admitting: Pharmacist

## 2019-11-15 ENCOUNTER — Ambulatory Visit: Payer: Self-pay | Admitting: Pharmacist

## 2019-11-15 DIAGNOSIS — G8929 Other chronic pain: Secondary | ICD-10-CM

## 2019-11-15 DIAGNOSIS — I890 Lymphedema, not elsewhere classified: Secondary | ICD-10-CM

## 2019-11-15 NOTE — Patient Instructions (Signed)
Visit Information  Goals Addressed              This Visit's Progress     Patient Stated   .  PharmD "I have depression, anxiety, and chronic pain" (pt-stated)        CARE PLAN ENTRY (see longtitudinal plan of care for additional care plan information)  Current Barriers:  . Polypharmacy; complex patient with multiple comorbidities including HTN, venous insufficiency, COPD, peripheral neuropathy, depression/anxiety, arthritis.  o Reports that she has been losing weight since starting Ozempic. Indicates that she has "no appetite whatsoever" and stays nauseous  o Reports that their trailer has been very hot during summer. Notes that there is still a hole in the floor that has not been fixed. Notes she called the repair service suggested by Care Guide, but it seems that they do not have funds to support coming out to fix their floor. Notes that the heat has been impacting her breathing. o Notes her sister plans to help her with her disability application, but that she has called the disability office but the call drops. Has thought about going to the [place] office, but she does not have transportation there, as the car they drive is [name]'s father's car, and he would not want someone driving it to [place].  o Notes that one of the more costly items for her is Depends o Notes that [facility] had previously told her that they may be able to supply a wheelchair carrier for her wheelchair  . Self-manages medications by using a pill box o Pre-diabetes/weight management: A1c 6.1%, Ozempic 0.5 mg weekly  - Eating crackers, "oriental" TV dinners. Wonders about meal delivery programs. She and husband focus on eating fruits - Drinking water w/ flavoring packets - Eating more [place], lean meats  o HTN/ASCVD risk reduction: lisinopril 40 mg daily; simvastatin 40 mg daily; follows w/ Dr. Milta Deiters; ECHO 9/7, appt 9/10. Notes she also has a pharmacologic stress test scheduled, and she is nervous  about that.  o Venous insufficiency: furosemide 40 mg daily; follows w/ Vascular; appt 9/7; using lymphedema pumps. Notes that compression stockings that she previously used irritated her skin o COPD/Asthma: montelukast 10 mg daily; Spiriva 18 mcg daily; Dulera 200/50 mcg BID, albuterol nebulizer PRN o Peripheral neuropathy/pain: gabapentin 300 mg, meloxicam 15 mg, methocarbamol 750 mg daily o Depression/anxiety: citalopram 20 mg daily  - Struggling w/ family stressors lately. Has been just over 1 year since her mother passed away, and her father has recently told the family that he is getting remarried - Struggling w/ relationship with her siblings and stepsiblings o Hypothyroid: levothyroxine 50 mcg o GERD: omeprazole 20 mg daily  Pharmacist Clinical Goal(s):  [name] Over the next 90 days, patient will work with PharmD and provider towards optimized medication management  Interventions: . Comprehensive medication review performed, medication list updated in electronic medical record . Inter-disciplinary care team collaboration (see longitudinal plan of care) . Provided empathetic listening as patient discussed current social stressors.  . Given significant GI upset, recommended patient reduce Ozempic back to 0.25 mg weekly. Encouraged focus on protein intake. Encouraged to keep f/u with PCP to discuss nutrition therapy. Consider referral for nutrition therapy at [facility]. Also encouraged patient to call her Medicaid plan to see if they have nutrition education benefits.  . Encouraged to continue w/ pharmacologic stress test per cardiology. Reviewed purpose of test.  . Will collaborate w/ clinical team regarding sending script for wheelchair carrier to  [facility], in case this helps towards wheelchair carrier attainment . Will place Care Guide referral for support in alternative resources for home repair, any support resources for pursuing her disability application and/or  transportation to [employer] office . Will collaborate w/ CCM team for any support w/ Depends. Encouraged patient to call Medicaid plan to see if they have any suggestions. Appears she may have a [employer] benefit w/ her plan that may help with reducing cost of Depends.   Patient Self Care Activities:  . Patient will take medications as prescribed . Patient will schedule f/u with specialty providers and attend these appointments  Please see past updates related to this goal by clicking on the "Past Updates" button in the selected goal         The patient verbalized understanding of instructions provided today and declined a print copy of patient instruction materials.   Plan:  - Scheduled f/u call in ~ 8 weeks  Catie Feliz Beam, PharmD, [facility] Clinical Pharmacist [facility] [phone]

## 2019-11-15 NOTE — Telephone Encounter (Addendum)
Patient notes that [name] said they may be able to help supply a wheelchair carrier for the back of their car, with a prescription.   Can we prepare a prescription for a "wheelchair carrier rack" and fax to [facility]?

## 2019-11-15 NOTE — Chronic Care Management (AMB) (Signed)
Chronic Care Management   Follow Up Note   11/15/2019 Name: Cheryl Chandler MRN: [id number] DOB: 21-Oct-1965  Referred by: [name], PA-C Reason for referral : Chronic Care Management (Medication Management)   Cheryl Chandler is a 55 y.o. year old female who is a primary care patient of [name], PA-C. The CCM team was consulted for assistance with chronic disease management and care coordination needs.    Contacted patient for medication management review.   Review of patient status, including review of consultants reports, relevant laboratory and other test results, and collaboration with appropriate care team members and the patient's provider was performed as part of comprehensive patient evaluation and provision of chronic care management services.    SDOH (Social Determinants of Health) assessments performed: Yes See Care Plan activities for detailed interventions related to SDOH)  [name]     Most Recent Value  [name]  Financial Strain Interventions Other (Comment)  Stress Interventions Other (Comment)  [Provided empathetic listening]  Transportation Interventions Other (Comment)  [care guide referral]       Outpatient Encounter Medications as of 11/15/2019  Medication Sig Note  . citalopram (CELEXA) 20 MG tablet Take 1 tablet (20 mg total) by mouth daily.   . furosemide (LASIX) 40 MG tablet Take 1 tablet (40 mg total) by mouth daily.   [name] gabapentin (NEURONTIN) 300 MG capsule Take 1 capsule (300 mg total) by mouth 3 (three) times daily as needed.   [name] levothyroxine (SYNTHROID) 50 MCG tablet Take 1 tablet (50 mcg total) by mouth daily.   [name] lisinopril (ZESTRIL) 40 MG tablet TAKE 1 TABLET BY MOUTH ONCE DAILY   . meloxicam (MOBIC) 15 MG tablet TAKE 1 TABLET BY MOUTH ONCE DAILY AS NEEDED WITH FOOD OR MILK   . methocarbamol (ROBAXIN) 750 MG tablet TAKE 1-2 TABLETS BY MOUTH 3 TIMES DAILY AS NEEDED   .  mometasone-formoterol (DULERA) 100-5 MCG/ACT AERO INHALE 2 PUFFS BY MOUTH TWICE A DAY. RINSE MOUTH AFTER USE.   . montelukast (SINGULAIR) 10 MG tablet Take 1 tablet (10 mg total) by mouth daily.   [name] omeprazole (PRILOSEC) 20 MG capsule Take 1 capsule (20 mg total) by mouth daily.   [name] OZEMPIC, 0.25 OR 0.5 MG/DOSE, 2 MG/1.5ML SOPN INJECT 0.25MG  SUBQ ONCE A WEEK   . QUEtiapine (SEROQUEL) 25 MG tablet Take 1 tablet (25 mg total) by mouth at bedtime.   . simvastatin (ZOCOR) 40 MG tablet Take 1 tablet (40 mg total) by mouth daily.   [name] SPIRIVA HANDIHALER 18 MCG inhalation capsule INHALE CONTENTS OF 1 CAPSULE ONCE DAILY   . traMADol (ULTRAM) 50 MG tablet Take 1 tablet (50 mg total) by mouth daily as needed.   . Vitamin D, Ergocalciferol, (DRISDOL) 1.25 MG (50000 UT) CAPS capsule Take 1 capsule (50,000 Units total) by mouth every 7 (seven) days.   [name] albuterol (PROVENTIL) (2.5 MG/3ML) 0.083% nebulizer solution INHALE CONTENTS OF 1 VIAL IN NEBULIZER EVERY 4 HOURS IF NEEDED FOR WHEEZING OR SHORTNESS OF BREATH.   [name] EPINEPHrine 0.3 mg/0.3 mL IJ SOAJ injection USE AS DIRECTED. 09/06/2019: Has if needed  . nitroGLYCERIN (NITROSTAT) 0.4 MG SL tablet DISSOLVE 1 TABLET UNDER TONGUE AT ONSET OF CHEST PAIN. REPEAT IN 5 MIN IF NOT RESOLVED, MAX 3 DOSES. 911 IF NEEDED.   [name] triamcinolone cream (KENALOG) 0.1 % Apply 1 application topically 2 (two) times daily.    No facility-administered encounter medications on file as of 11/15/2019.  Objective:   Goals Addressed              This Visit's Progress     Patient Stated   .  PharmD "I have depression, anxiety, and chronic pain" (pt-stated)        CARE PLAN ENTRY (see longtitudinal plan of care for additional care plan information)  Current Barriers:  . Polypharmacy; complex patient with multiple comorbidities including HTN, venous insufficiency, COPD, peripheral neuropathy, depression/anxiety, arthritis.  o Reports that she has been losing weight since  starting Ozempic. Indicates that she has "no appetite whatsoever" and stays nauseous  o Reports that their trailer has been very hot during summer. Notes that there is still a hole in the floor that has not been fixed. Notes she called the repair service suggested by Care Guide, but it seems that they do not have funds to support coming out to fix their floor. Notes that the heat has been impacting her breathing. o Notes her sister plans to help her with her disability application, but that she has called the disability office but the call drops. Has thought about going to the [place] office, but she does not have transportation there, as the car they drive is [name]'s father's car, and he would not want someone driving it to [place].  o Notes that one of the more costly items for her is Depends o Notes that [facility] had previously told her that they may be able to supply a wheelchair carrier for her wheelchair  . Self-manages medications by using a pill box o Pre-diabetes/weight management: A1c 6.1%, Ozempic 0.5 mg weekly  - Eating crackers, "oriental" TV dinners. Wonders about meal delivery programs. She and husband focus on eating fruits - Drinking water w/ flavoring packets - Eating more [place], lean meats  o HTN/ASCVD risk reduction: lisinopril 40 mg daily; simvastatin 40 mg daily; follows w/ Dr. Milta Deiters; ECHO 9/7, appt 9/10. Notes she also has a pharmacologic stress test scheduled, and she is nervous about that.  o Venous insufficiency: furosemide 40 mg daily; follows w/ Vascular; appt 9/7; using lymphedema pumps. Notes that compression stockings that she previously used irritated her skin o COPD/Asthma: montelukast 10 mg daily; Spiriva 18 mcg daily; Dulera 200/50 mcg BID, albuterol nebulizer PRN o Peripheral neuropathy/pain: gabapentin 300 mg, meloxicam 15 mg, methocarbamol 750 mg daily o Depression/anxiety: citalopram 20 mg daily  - Struggling w/ family stressors lately. Has been  just over 1 year since her mother passed away, and her father has recently told the family that he is getting remarried - Struggling w/ relationship with her siblings and stepsiblings o Hypothyroid: levothyroxine 50 mcg o GERD: omeprazole 20 mg daily  Pharmacist Clinical Goal(s):  [name] Over the next 90 days, patient will work with PharmD and provider towards optimized medication management  Interventions: . Comprehensive medication review performed, medication list updated in electronic medical record . Inter-disciplinary care team collaboration (see longitudinal plan of care) . Provided empathetic listening as patient discussed current social stressors.  . Given significant GI upset, recommended patient reduce Ozempic back to 0.25 mg weekly. Encouraged focus on protein intake. Encouraged to keep f/u with PCP to discuss nutrition therapy. Consider referral for nutrition therapy at [facility]. Also encouraged patient to call her Medicaid plan to see if they have nutrition education benefits.  . Encouraged to continue w/ pharmacologic stress test per cardiology. Reviewed purpose of test.  . Will collaborate w/ clinical team regarding sending script for wheelchair carrier  to [employer], in case this helps towards wheelchair carrier attainment . Will place Care Guide referral for support in alternative resources for home repair, any support resources for pursuing her disability application and/or transportation to [employer] office . Will collaborate w/ CCM team for any support w/ Depends. Encouraged patient to call Medicaid plan to see if they have any suggestions. Appears she may have a [employer] benefit w/ her plan that may help with reducing cost of Depends.   Patient Self Care Activities:  . Patient will take medications as prescribed . Patient will schedule f/u with specialty providers and attend these appointments  Please see past updates related to this goal by  clicking on the "Past Updates" button in the selected goal          Plan:  - Scheduled f/u call in ~ 8 weeks  Catie Feliz Beam, PharmD, [facility] Clinical Pharmacist [facility] [phone]

## 2019-11-16 NOTE — Telephone Encounter (Signed)
Rx generated and placed in provider's folder for signature

## 2019-11-17 NOTE — Telephone Encounter (Signed)
Rx faxed

## 2019-11-26 DIAGNOSIS — Z419 Encounter for procedure for purposes other than remedying health state, unspecified: Secondary | ICD-10-CM | POA: Diagnosis not present

## 2019-11-29 ENCOUNTER — Ambulatory Visit: Payer: Self-pay | Admitting: General Practice

## 2019-11-29 ENCOUNTER — Telehealth: Payer: Self-pay | Admitting: General Practice

## 2019-11-29 DIAGNOSIS — R7301 Impaired fasting glucose: Secondary | ICD-10-CM

## 2019-11-29 DIAGNOSIS — J449 Chronic obstructive pulmonary disease, unspecified: Secondary | ICD-10-CM

## 2019-11-29 DIAGNOSIS — F419 Anxiety disorder, unspecified: Secondary | ICD-10-CM

## 2019-11-29 DIAGNOSIS — E785 Hyperlipidemia, unspecified: Secondary | ICD-10-CM

## 2019-11-29 DIAGNOSIS — I10 Essential (primary) hypertension: Secondary | ICD-10-CM

## 2019-11-29 DIAGNOSIS — F331 Major depressive disorder, recurrent, moderate: Secondary | ICD-10-CM

## 2019-11-29 NOTE — Chronic Care Management (AMB) (Signed)
Care Management   Follow Up Note   11/29/2019 Name: Cheryl Chandler MRN: [id number] DOB: [birth year]  Referred by: [name], PA-C Reason for referral : Care Coordination (RNCM Chronic Disease Managment and Care Coordination Needs)   Cheryl Chandler is a 54 y.o. year old female who is a primary care patient of [name], PA-C. The care management team was consulted for assistance with care management and care coordination needs.    Review of patient status, including review of consultants reports, relevant laboratory and other test results, and collaboration with appropriate care team members and the patient's provider was performed as part of comprehensive patient evaluation and provision of chronic care management services.    SDOH (Social Determinants of Health) assessments performed: Yes See Care Plan activities for detailed interventions related to [facility])     Advanced Directives: See Care Plan and Vynca application for related entries.   Goals Addressed              This Visit's Progress   .  RN- I need more help in the home (pt-stated)        Current Barriers:  [name] Knowledge Deficits related to managing multiple chronic diseases  . Lacks caregiver support.  . Corporate treasurer.  . Transportation barriers- patient depends on others for transporation  Nurse Case Manager Clinical Goal(s):  [name] Over the next 120 days, patient will work with [facility]  to address needs related to Multiple chronic Disease Processes . Over the next 120 days, patient will work with [facility]  (community agency) to Increase Strength and regain some independence   Interventions:  . Discussed plans with patient for ongoing care management follow up and provided patient with direct contact information for care management team  . Evaluated current home/life routine and her husband is home and doing well, but still can not drive. No transportation barriers as  her husbands Dad comes and takes them to appointments.  . Evaluated help in the home and the patient has an aide that comes in and helps with her needs.  The patient has an aide that comes in every Thursday.  She is doing well with this. 11/29/2019: The patient still has help in the home.  The patient is currently having a lot of stressors in her life due to family issues going on. . Evaluation of SDOH.  Has holes in the floor and needs help with getting the floor fixed. Has supplies. Has been calling area churches but has not had any luck with finding someone to do the work. Will see if  resources are available to help with expressed needs.    Patient Self Care Activities:   . Currently UNABLE TO independently perform ADLs or IADLs   Please see past updates related to this goal by clicking on the "Past Updates" button in the selected goal      .  RN: My blood pressure is sometimes high        Current Barriers:  . Chronic Disease Management support and education needs related to management of HTN, IFG,  and other chronic disease processes  Nurse Case Manager Clinical Goal(s):  [name] Over the next 120 days, patient will work with [facility] and pcp to address needs related to increased stress impacting her health and well being related to managing her blood pressure . Over the next 120 days, patient will attend all scheduled medical appointments: Cardiology appointment on 2/8 and pcp on 2/10 . Over  the next 120 days, patient will verbalize basic understanding of HTN and heart disease process and self health management plan as evidenced by managing blood pressure more effectively  Interventions:  . Evaluation of current treatment plan related to HTN and IFG, and patient's adherence to plan as established by provider. . Provided education to patient re: exercise and heart healthy diet.  The patient endorses watching her diet but since going on Ozempic she has not had an appetite.  Education on eating heart  healthy/Ada diet. The patient is tolerating the 0.25mg  because the 0.5mg  makes her stomach upset. Will see pcp next week.  . Reviewed medications with patient and discussed compliance (also working with CCM pharmacist) . Evaluation of blood pressure readings. The patient has blood pressure readings up and down. The patient does get headaches at times.  The patient admits she does not always watch the sodium in the foods.  Education and support given . Evaluation of swelling. The patient has not been able to wear compression stocking. The patient says it causes her to have blisters on her legs.  The patient is elevating her legs and using her lymphedema.  . Discussed plans with patient for ongoing care management follow up and provided patient with direct contact information for care management team . Advised patient, providing education and rationale, to monitor blood pressure daily and record, calling pcp for findings outside established parameters.  . Reviewed scheduled/upcoming provider appointments including: pcp appointment on 12-06-2019 . Social Work referral for stressors in her life, loss of her mom and her dad's current lifestyle- the patients dad is getting remarried and this has really been hard on her,  and other factors impacting her health- working with LCSW . Pharmacy referral for ongoing support for medication management   Patient Self Care Activities:  . Patient verbalizes understanding of plan to manage HTN more effectively by institution of life style changes: diet, exercise, reducing stressors . Self administers medications as prescribed . Attends all scheduled provider appointments . Calls provider office for new concerns or questions . Unable to independently manage HTN and other chronic health conditions  Please see past updates related to this goal by clicking on the "Past Updates" button in the selected goal          Telephone follow up appointment with care management  team member scheduled for: 01-24-2020 at 2 pm  Alto Denver RN, MSN, CCM Community Care Coordinator [place]  [facility] Mobile: [phone]

## 2019-11-29 NOTE — Patient Instructions (Signed)
Visit Information  Goals Addressed              This Visit's Progress   .  RN- I need more help in the home (pt-stated)        Current Barriers:  [name] Knowledge Deficits related to managing multiple chronic diseases  . Lacks caregiver support.  . Corporate treasurer.  . Transportation barriers- patient depends on others for transporation  Nurse Case Manager Clinical Goal(s):  [name] Over the next 120 days, patient will work with [facility]  to address needs related to Multiple chronic Disease Processes . Over the next 120 days, patient will work with [facility]  (community agency) to Increase Strength and regain some independence   Interventions:  . Discussed plans with patient for ongoing care management follow up and provided patient with direct contact information for care management team  . Evaluated current home/life routine and her husband is home and doing well, but still can not drive. No transportation barriers as her husbands Dad comes and takes them to appointments.  . Evaluated help in the home and the patient has an aide that comes in and helps with her needs.  The patient has an aide that comes in every Thursday.  She is doing well with this. 11/29/2019: The patient still has help in the home.  The patient is currently having a lot of stressors in her life due to family issues going on. . Evaluation of SDOH.  Has holes in the floor and needs help with getting the floor fixed. Has supplies. Has been calling area churches but has not had any luck with finding someone to do the work. Will see if  resources are available to help with expressed needs.    Patient Self Care Activities:   . Currently UNABLE TO independently perform ADLs or IADLs   Please see past updates related to this goal by clicking on the "Past Updates" button in the selected goal      .  RN: My blood pressure is sometimes high        Current Barriers:  . Chronic Disease Management support and education  needs related to management of HTN, IFG,  and other chronic disease processes  Nurse Case Manager Clinical Goal(s):  [name] Over the next 120 days, patient will work with [facility] and pcp to address needs related to increased stress impacting her health and well being related to managing her blood pressure . Over the next 120 days, patient will attend all scheduled medical appointments: Cardiology appointment on 2/8 and pcp on 2/10 . Over the next 120 days, patient will verbalize basic understanding of HTN and heart disease process and self health management plan as evidenced by managing blood pressure more effectively  Interventions:  . Evaluation of current treatment plan related to HTN and IFG, and patient's adherence to plan as established by provider. . Provided education to patient re: exercise and heart healthy diet.  The patient endorses watching her diet but since going on Ozempic she has not had an appetite.  Education on eating heart healthy/Ada diet. The patient is tolerating the 0.25mg  because the 0.5mg  makes her stomach upset. Will see pcp next week.  . Reviewed medications with patient and discussed compliance (also working with CCM pharmacist) . Evaluation of blood pressure readings. The patient has blood pressure readings up and down. The patient does get headaches at times.  The patient admits she does not always watch the sodium in the foods.  Education and support given . Evaluation of swelling. The patient has not been able to wear compression stocking. The patient says it causes her to have blisters on her legs.  The patient is elevating her legs and using her lymphedema.  . Discussed plans with patient for ongoing care management follow up and provided patient with direct contact information for care management team . Advised patient, providing education and rationale, to monitor blood pressure daily and record, calling pcp for findings outside established parameters.  . Reviewed  scheduled/upcoming provider appointments including: pcp appointment on 12-06-2019 . Social Work referral for stressors in her life, loss of her mom and her dad's current lifestyle- the patients dad is getting remarried and this has really been hard on her,  and other factors impacting her health- working with LCSW . Pharmacy referral for ongoing support for medication management   Patient Self Care Activities:  . Patient verbalizes understanding of plan to manage HTN more effectively by institution of life style changes: diet, exercise, reducing stressors . Self administers medications as prescribed . Attends all scheduled provider appointments . Calls provider office for new concerns or questions . Unable to independently manage HTN and other chronic health conditions  Please see past updates related to this goal by clicking on the "Past Updates" button in the selected goal         Patient verbalizes understanding of instructions provided today.   Telephone follow up appointment with care management team member scheduled for: 01-24-2020 2pm  Alto Denver RN, MSN, CCM Community Care Coordinator [place]  [facility] Mobile: [phone]

## 2019-12-06 ENCOUNTER — Other Ambulatory Visit: Payer: Self-pay

## 2019-12-06 ENCOUNTER — Encounter: Payer: Self-pay | Admitting: Family Medicine

## 2019-12-06 ENCOUNTER — Ambulatory Visit (INDEPENDENT_AMBULATORY_CARE_PROVIDER_SITE_OTHER): Payer: Medicaid Other | Admitting: Family Medicine

## 2019-12-06 VITALS — BP 162/78 | HR 81 | Temp 98.8°F | Wt 316.0 lb

## 2019-12-06 DIAGNOSIS — I10 Essential (primary) hypertension: Secondary | ICD-10-CM | POA: Diagnosis not present

## 2019-12-06 DIAGNOSIS — I890 Lymphedema, not elsewhere classified: Secondary | ICD-10-CM | POA: Diagnosis not present

## 2019-12-06 DIAGNOSIS — F5101 Primary insomnia: Secondary | ICD-10-CM | POA: Diagnosis not present

## 2019-12-06 MED ORDER — QUETIAPINE FUMARATE 25 MG PO TABS: 25.0000 mg | ORAL_TABLET | Freq: Every day | ORAL | 1 refills | Status: DC

## 2019-12-06 MED ORDER — OZEMPIC (0.25 OR 0.5 MG/DOSE) 2 MG/1.5ML ~~LOC~~ SOPN: PEN_INJECTOR | SUBCUTANEOUS | 2 refills | Status: DC

## 2019-12-06 MED ORDER — FUROSEMIDE 20 MG PO TABS: ORAL_TABLET | ORAL | 3 refills | Status: DC

## 2019-12-06 NOTE — Progress Notes (Signed)
BP (!) 162/78 Comment: right lower arm  Pulse 81   Temp 98.8 F (37.1 C) (Oral)   Wt (!) 316 lb (143.3 kg) Comment: patient refused. pt in wheelchair  LMP 06/09/2017 (Approximate)   SpO2 95%   BMI 54.24 kg/m    Subjective:    Patient ID: Cheryl Chandler, female    DOB: [birth year], 54 y.o.   MRN: [id number]  HPI: Cheryl Chandler is a 54 y.o. female  Chief Complaint  Patient presents with  . Insomnia  . Weight Check   Here today for f/u insomnia and weight.   Sleeping better with the seroquel, no side effects or concerns with that. Trying to improve her sleep hygiene as well though feels part of the issue is she sleeps on a very worn down couch and does not have heating and cooling in her house. Trying to use fans to help make the environment more comfortable.   Down 15 lb so far on the ozempic. Tolerating it very well and happy with the progress. Trying to eat better additionally though has limited financial resources for groceries.   Still doing her PT exercises for her legs with severe lymphedema, feels it's helping quite a lot. Has lymphedema pumps as well that she uses fairly regularly. Has been unable to find compression stockings that fit comfortably yet. Currently taking 40 mg lasix for HTN, edema which she does feel like helps overall.   Relevant past medical, surgical, family and social history reviewed and updated as indicated. Interim medical history since our last visit reviewed. Allergies and medications reviewed and updated.  Review of Systems  Per HPI unless specifically indicated above     Objective:    BP (!) 162/78 Comment: right lower arm  Pulse 81   Temp 98.8 F (37.1 C) (Oral)   Wt (!) 316 lb (143.3 kg) Comment: patient refused. pt in wheelchair  LMP 06/09/2017 (Approximate)   SpO2 95%   BMI 54.24 kg/m   Wt Readings from Last 3 Encounters:  12/06/19 (!) 316 lb (143.3 kg)  06/30/19 (!) 329 lb (149.2 kg)  05/03/19 (!) 329 lb (149.2  kg)    Physical Exam Vitals and nursing note reviewed.  Constitutional:      Appearance: Normal appearance. She is not ill-appearing.  HENT:     Head: Atraumatic.  Eyes:     Extraocular Movements: Extraocular movements intact.     Conjunctiva/sclera: Conjunctivae normal.  Cardiovascular:     Rate and Rhythm: Normal rate and regular rhythm.     Heart sounds: Normal heart sounds.  Pulmonary:     Effort: Pulmonary effort is normal.     Breath sounds: Normal breath sounds.  Musculoskeletal:        General: Swelling: 1+ edema b/l LEs. Normal range of motion.     Cervical back: Normal range of motion and neck supple.  Skin:    General: Skin is warm and dry.     Findings: No erythema.  Neurological:     Mental Status: She is alert and oriented to person, place, and time.  Psychiatric:        Mood and Affect: Mood normal.        Thought Content: Thought content normal.        Judgment: Judgment normal.    Results for orders placed or performed in visit on 09/06/19  Lipid Panel w/o Chol/HDL Ratio  Result Value Ref Range   Cholesterol, Total 136 100 - 199 mg/dL  Triglycerides 113 0 - 149 mg/dL   HDL 38 (L) >58 mg/dL   VLDL Cholesterol Cal 21 5 - 40 mg/dL   LDL Chol Calc (NIH) 77 0 - 99 mg/dL  Comprehensive metabolic panel  Result Value Ref Range   Glucose 130 (H) 65 - 99 mg/dL   BUN 13 6 - 24 mg/dL   Creatinine, Ser 0.99 0.57 - 1.00 mg/dL   GFR calc non Af Amer 94 >59 mL/min/1.73   GFR calc Af Amer 109 >59 mL/min/1.73   BUN/Creatinine Ratio 18 9 - 23   Sodium 141 134 - 144 mmol/L   Potassium 4.6 3.5 - 5.2 mmol/L   Chloride 101 96 - 106 mmol/L   CO2 25 20 - 29 mmol/L   Calcium 8.8 8.7 - 10.2 mg/dL   Total Protein 7.0 6.0 - 8.5 g/dL   Albumin 4.0 3.8 - 4.9 g/dL   Globulin, Total 3.0 1.5 - 4.5 g/dL   Albumin/Globulin Ratio 1.3 1.2 - 2.2   Bilirubin Total 0.3 0.0 - 1.2 mg/dL   Alkaline Phosphatase 80 39 - 117 IU/L   AST 12 0 - 40 IU/L   ALT 22 0 - 32 IU/L  VITAMIN D 25  Hydroxy (Vit-D Deficiency, Fractures)  Result Value Ref Range   Vit D, 25-Hydroxy 20.7 (L) 30.0 - 100.0 ng/mL  HgB A1c  Result Value Ref Range   Hgb A1c MFr Bld 6.1 (H) 4.8 - 5.6 %   Est. average glucose Bld gHb Est-mCnc 128 mg/dL      Assessment & Plan:   Problem List Items Addressed This Visit      Cardiovascular and Mediastinum   Essential hypertension    Mildly elevated, add 20 mg lasix prn to help with swelling and continue remainder of regimen. Continue working on weight loss.       Relevant Medications   furosemide (LASIX) 20 MG tablet     Other   Insomnia    Improved with seroquel. Continue working on improving comfort level in home with air conditioning and more comfortable bed. Continue seroquel regimen      Morbid obesity (HCC)    Excellent early benefit on ozempic, continue present medications and diet and exercise as tolerated      Relevant Medications   Semaglutide,0.25 or 0.5MG /DOS, (OZEMPIC, 0.25 OR 0.5 MG/DOSE,) 2 MG/1.5ML SOPN   Lymphedema - Primary    Much improved but still retaining quite a bit. Work on finding compression stockings that are comfortable, continue lymphedema pumps, massage, working on weight loss.           Follow up plan: Return in about 3 months (around 03/07/2020) for 3 month f/u.

## 2019-12-07 ENCOUNTER — Other Ambulatory Visit: Payer: Self-pay | Admitting: Family Medicine

## 2019-12-14 NOTE — Assessment & Plan Note (Signed)
Improved with seroquel. Continue working on improving comfort level in home with air conditioning and more comfortable bed. Continue seroquel regimen

## 2019-12-14 NOTE — Assessment & Plan Note (Signed)
Excellent early benefit on ozempic, continue present medications and diet and exercise as tolerated

## 2019-12-14 NOTE — Assessment & Plan Note (Signed)
Much improved but still retaining quite a bit. Work on finding compression stockings that are comfortable, continue lymphedema pumps, massage, working on weight loss.

## 2019-12-14 NOTE — Assessment & Plan Note (Signed)
Mildly elevated, add 20 mg lasix prn to help with swelling and continue remainder of regimen. Continue working on weight loss.

## 2019-12-20 ENCOUNTER — Telehealth: Payer: Self-pay | Admitting: Family Medicine

## 2019-12-27 DIAGNOSIS — Z419 Encounter for procedure for purposes other than remedying health state, unspecified: Secondary | ICD-10-CM | POA: Diagnosis not present

## 2020-01-02 ENCOUNTER — Other Ambulatory Visit: Payer: Self-pay | Admitting: Family Medicine

## 2020-01-02 DIAGNOSIS — R059 Cough, unspecified: Secondary | ICD-10-CM

## 2020-01-02 DIAGNOSIS — R0602 Shortness of breath: Secondary | ICD-10-CM

## 2020-01-02 NOTE — Telephone Encounter (Signed)
Requested medication (s) are due for refill today: yes   Requested medication (s) are on the active medication list: yes   Last refill:  01/02/2020  Future visit scheduled: yes   Notes to clinic:  Medication last filled by Roosvelt Maser Please review for refills   Requested Prescriptions  Pending Prescriptions Disp Refills   meloxicam (MOBIC) 15 MG tablet [Pharmacy Med Name: MELOXICAM 15 MG TAB] 30 tablet 0    Sig: TAKE 1 TABLET BY MOUTH ONCE DAILY AS NEEDED WITH FOOD OR MILK      Analgesics:  COX2 Inhibitors Passed - 01/02/2020  1:20 PM      Passed - HGB in normal range and within 360 days    Hemoglobin  Date Value Ref Range Status  05/03/2019 12.9 11.1 - 15.9 g/dL Final          Passed - Cr in normal range and within 360 days    Creatinine  Date Value Ref Range Status  08/05/2014 0.86 mg/dL Final    Comment:    1.91-4.78 NOTE: New Reference Range  07/03/14    Creatinine, Ser  Date Value Ref Range Status  09/06/2019 0.73 0.57 - 1.00 mg/dL Final          Passed - Patient is not pregnant      Passed - Valid encounter within last 12 months    Recent Outpatient Visits           3 weeks ago Lymphedema   [facility] [name], PA-C   1 month ago Primary insomnia   [facility] [name], [place]   3 months ago IFG (impaired fasting glucose)   [facility], [place], [place]   6 months ago Upper respiratory tract infection, unspecified type   [facility], [place], [place]   8 months ago Essential hypertension   [facility] [place], Salley Hews, [place]       Future Appointments             In 2 months [name], Dorie Rank, NP [employer], [facility]              simvastatin (ZOCOR) 40 MG tablet [employer] Med Name: SIMVASTATIN 40 MG TAB] 90 tablet 1    Sig: TAKE 1 TABLET BY MOUTH AT BEDTIME      Cardiovascular:  Antilipid - Statins Failed - 01/02/2020   1:20 PM      Failed - LDL in normal range and within 360 days    LDL Chol Calc (NIH)  Date Value Ref Range Status  09/06/2019 77 0 - 99 mg/dL Final          Failed - HDL in normal range and within 360 days    HDL  Date Value Ref Range Status  09/06/2019 38 (L) >39 mg/dL Final          Passed - Total Cholesterol in normal range and within 360 days    Cholesterol, Total  Date Value Ref Range Status  09/06/2019 136 100 - 199 mg/dL Final          Passed - Triglycerides in normal range and within 360 days    Triglycerides  Date Value Ref Range Status  09/06/2019 113 0 - 149 mg/dL Final          Passed - Patient is not pregnant      Passed - Valid encounter within last 12 months    Recent  Outpatient Visits           3 weeks ago Lymphedema   [facility] [name], [place]   1 month ago Primary insomnia   [facility] Roosvelt Maser [place], [place]   3 months ago IFG (impaired fasting glucose)   [facility] Roosvelt Maser [place], [place]   6 months ago Upper respiratory tract infection, unspecified type   [facility] Roosvelt Maser [place], [place]   8 months ago Essential hypertension   [facility] [name], [place]       Future Appointments             In 2 months [name], Corrie Dandy T, NP [employer], [facility]              albuterol (PROVENTIL) (2.5 MG/3ML) 0.083% nebulizer solution [Pharmacy Med Name: ALBUTEROL SULFATE (2.5 MG/3ML) 0.08] 90 mL     Sig: INHALE CONTENTS OF 1 VIAL IN NEBULIZER EVERY 4 HOURS AS NEEDED      Pulmonology:  Beta Agonists Failed - 01/02/2020  1:20 PM      Failed - One inhaler should last at least one month. If the patient is requesting refills earlier, contact the patient to check for uncontrolled symptoms.      Passed - Valid encounter within last 12 months    Recent Outpatient Visits           3 weeks ago Lymphedema   [facility]  [name], [place]   1 month ago Primary insomnia   [facility] Roosvelt Maser [place], [place]   3 months ago IFG (impaired fasting glucose)   [facility], [place], [place]   6 months ago Upper respiratory tract infection, unspecified type   [facility] Roosvelt Maser [place], [place]   8 months ago Essential hypertension   [facility] [place], Salley Hews, [place]       Future Appointments             In 2 months [name], Dorie Rank, NP [employer], [facility]              Vitamin D, Ergocalciferol, (DRISDOL) 1.25 MG (50000 UNIT) CAPS capsule [Pharmacy Med Name: VITAMIN D (ERGOCALCIFEROL) 1.25 MG] 12 capsule 1    Sig: TAKE 1 CAPSULE BY MOUTH ONCE EVERY 7 DAYS      Endocrinology:  Vitamins - Vitamin D Supplementation Failed - 01/02/2020  1:20 PM      Failed - 50,000 IU strengths are not delegated      Failed - Phosphate in normal range and within 360 days    No results found for: PHOS        Failed - Vitamin D in normal range and within 360 days    Vit D, 25-Hydroxy  Date Value Ref Range Status  09/06/2019 20.7 (L) 30.0 - 100.0 ng/mL Final    Comment:    Vitamin D deficiency has been defined by the Institute of Medicine and an Endocrine Society practice guideline as a level of serum 25-OH vitamin D less than 20 ng/mL (1,2). The Endocrine Society went on to further define vitamin D insufficiency as a level between 21 and 29 ng/mL (2). 1. IOM (Institute of Medicine). 2010. Dietary reference    intakes for calcium and D. [place]: The    [employer]. 2. Holick MF, Binkley [place], Bischoff-Ferrari HA, et al.    Evaluation, treatment, and prevention  of vitamin D    deficiency: an Endocrine Society clinical practice    guideline. JCEM. 2011 Jul; 96(7):1911-30.           Passed - Ca in normal range and within 360 days    Calcium  Date Value Ref Range Status  09/06/2019 8.8 8.7 - 10.2  mg/dL Final   Calcium, Total  Date Value Ref Range Status  08/05/2014 8.2 (L) mg/dL Final    Comment:    2.5-42.7 NOTE: New Reference Range  07/03/14           Passed - Valid encounter within last 12 months    Recent Outpatient Visits           3 weeks ago Lymphedema   [facility] [name], [place]   1 month ago Primary insomnia   [facility] Roosvelt Maser [place], [place]   3 months ago IFG (impaired fasting glucose)   [facility], [place], [place]   6 months ago Upper respiratory tract infection, unspecified type   [facility], [place], [place]   8 months ago Essential hypertension   [facility] [place], Salley Hews, [place]       Future Appointments             In 2 months [name], Dorie Rank, NP [employer], [facility]              citalopram (CELEXA) 20 MG tablet [Pharmacy Med Name: CITALOPRAM HYDROBROMIDE 20 MG TAB] 90 tablet 1    Sig: TAKE 1 TABLET BY MOUTH ONCE DAILY      Psychiatry:  Antidepressants - SSRI Passed - 01/02/2020  1:20 PM      Passed - Completed PHQ-2 or PHQ-9 in the last 360 days.      Passed - Valid encounter within last 6 months    Recent Outpatient Visits           3 weeks ago Lymphedema   [facility] [name], [place]   1 month ago Primary insomnia   [facility] Roosvelt Maser [place], [place]   3 months ago IFG (impaired fasting glucose)   [facility] Roosvelt Maser [place], [place]   6 months ago Upper respiratory tract infection, unspecified type   [facility] Roosvelt Maser [place], [place]   8 months ago Essential hypertension   [facility] [place], [place] Hews, [place]       Future Appointments             In 2 months [name], Dorie Rank, NP [employer], [facility]

## 2020-01-02 NOTE — Telephone Encounter (Signed)
Routing to provider  

## 2020-01-11 ENCOUNTER — Ambulatory Visit (INDEPENDENT_AMBULATORY_CARE_PROVIDER_SITE_OTHER): Payer: Medicaid Other | Admitting: Vascular Surgery

## 2020-01-17 ENCOUNTER — Ambulatory Visit: Payer: Self-pay | Admitting: Pharmacist

## 2020-01-21 NOTE — Chronic Care Management (AMB) (Signed)
Chronic Care Management   Follow Up Note   01/21/2020 Name: Cheryl Chandler MRN: [id number] DOB: 02/13/1966  Referred by: [name], PA-C Reason for referral : No chief complaint on file.   Cheryl Chandler is a 54 y.o. year old female who is a primary care patient of [name], [place]. The CCM team was consulted for assistance with chronic disease management and care coordination needs.    Review of patient status, including review of consultants reports, relevant laboratory and other test results, and collaboration with appropriate care team members and the patient's provider was performed as part of comprehensive patient evaluation and provision of chronic care management services.    SDOH (Social Determinants of Health) assessments performed: No See Care Plan activities for detailed interventions related to [facility])     Outpatient Encounter Medications as of 01/17/2020  Medication Sig Note  . albuterol (PROVENTIL) (2.5 MG/3ML) 0.083% nebulizer solution INHALE CONTENTS OF 1 VIAL IN NEBULIZER EVERY 4 HOURS AS NEEDED   . citalopram (CELEXA) 20 MG tablet TAKE 1 TABLET BY MOUTH ONCE DAILY   . furosemide (LASIX) 40 MG tablet Take 1 tablet (40 mg total) by mouth daily.   [name] gabapentin (NEURONTIN) 300 MG capsule Take 1 capsule (300 mg total) by mouth 3 (three) times daily as needed.   [name] levothyroxine (SYNTHROID) 50 MCG tablet Take 1 tablet (50 mcg total) by mouth daily.   [name] lisinopril (ZESTRIL) 40 MG tablet TAKE 1 TABLET BY MOUTH ONCE DAILY   . mometasone-formoterol (DULERA) 100-5 MCG/ACT AERO INHALE 2 PUFFS BY MOUTH TWICE A DAY. RINSE MOUTH AFTER USE.   . montelukast (SINGULAIR) 10 MG tablet Take 1 tablet (10 mg total) by mouth daily.   . nitroGLYCERIN (NITROSTAT) 0.4 MG SL tablet DISSOLVE 1 TABLET UNDER TONGUE AT ONSET OF CHEST PAIN. REPEAT IN 5 MIN IF NOT RESOLVED, MAX 3 DOSES. 911 IF NEEDED.   . QUEtiapine (SEROQUEL) 25 MG tablet Take 1 tablet (25 mg total)  by mouth at bedtime.   . Semaglutide,0.25 or 0.5MG /DOS, (OZEMPIC, 0.25 OR 0.5 MG/DOSE,) 2 MG/1.5ML SOPN INJECT 0.5 MG SUBQ ONCE A WEEK   . simvastatin (ZOCOR) 40 MG tablet TAKE 1 TABLET BY MOUTH AT BEDTIME   . SPIRIVA HANDIHALER 18 MCG inhalation capsule INHALE CONTENTS OF 1 CAPSULE ONCE DAILY   . traMADol (ULTRAM) 50 MG tablet Take 1 tablet (50 mg total) by mouth daily as needed.   . Vitamin D, Ergocalciferol, (DRISDOL) 1.25 MG (50000 UNIT) CAPS capsule TAKE 1 CAPSULE BY MOUTH ONCE EVERY 7 DAYS   . EPINEPHrine 0.3 mg/0.3 mL IJ SOAJ injection USE AS DIRECTED. 09/06/2019: Has if needed  . furosemide (LASIX) 20 MG tablet Take 1 tab in the afternoon as needed for leg edema   . meloxicam (MOBIC) 15 MG tablet TAKE 1 TABLET BY MOUTH ONCE DAILY AS NEEDED WITH FOOD OR MILK   . methocarbamol (ROBAXIN) 750 MG tablet TAKE 1-2 TABLETS BY MOUTH 3 TIMES DAILY AS NEEDED   . omeprazole (PRILOSEC) 20 MG capsule Take 1 capsule (20 mg total) by mouth daily.   [name] triamcinolone cream (KENALOG) 0.1 % Apply 1 application topically 2 (two) times daily.    No facility-administered encounter medications on file as of 01/17/2020.     Objective:   Goals Addressed              This Visit's Progress   .  PharmD "I have depression, anxiety, and chronic pain" (pt-stated)  CARE PLAN ENTRY (see longtitudinal plan of care for additional care plan information)  Current Barriers:  . Polypharmacy; complex patient with multiple comorbidities including HTN, venous insufficiency, COPD, peripheral neuropathy, depression/anxiety, arthritis.  o Reports that she has been losing weight since starting Ozempic.  o Reports they not gotten the floor fixed in their trailer. They have the supplies and father-in-law is arranging the repair. o Reports a lot of recent family stressors. Will's uncle passed away, their car is in the shop and will cost $2000 to repair. They both received first doses of Moderna COVID vaccine on 12/25/19.  Reports they both had a "stomach bug" after. States her Dad recently remarried and that has been difficult. o Sister was planning to help her with disability application but she does not know if they completed this and sent it in. She will follow up with her sister. o Notes that one of the more costly items for her is Depends o Notes that [facility] has not been able to get her a wheelchair carrier. She will follow up with them next week. . Self-manages medications by using a pill box o Pre-diabetes/weight management: A1c 6.1%, Ozempic 0.5 mg weekly  - Eating salads, cooking meals with air fryer or baking - Drinking water w/ flavoring packets - Eating more [place], lean meats  - Has been doing her therapy exercises to help with her legs. o HTN/ASCVD risk reduction: lisinopril 40 mg daily; simvastatin 40 mg daily; follows w/ Dr. Milta Deiters; Was scheduled for ECHO this month but had to cancel because she and Will were sick. She tried to call and reschedule appointment today but did not get an answer. o Venous insufficiency: furosemide 40 mg daily; follows w/ Vascular; appt 10/11-office rescheduled her Sept appoint; using lymphedema pumps. Notes that compression stockings that she previously used irritated her skin o Has had leg cramps for the past four nights.  o COPD/Asthma: montelukast 10 mg daily; Spiriva 18 mcg daily; Dulera 200/50 mcg BID, albuterol nebulizer PRN o Peripheral neuropathy/pain: gabapentin 300 mg, meloxicam 15 mg, methocarbamol 750 mg daily o Depression/anxiety: citalopram 20 mg daily  - Struggling w/ family stressors lately. - Struggling w/ relationship with her siblings and stepsiblings o Hypothyroid: levothyroxine 50 mcg o GERD: omeprazole 20 mg daily  Pharmacist Clinical Goal(s):  [name] Over the next 90 days, patient will work with PharmD and provider towards optimized medication management  Interventions: . Comprehensive medication review performed, medication list updated  in electronic medical record . Inter-disciplinary care team collaboration (see longitudinal plan of care) . Provided empathetic listening as patient discussed current social stressors.  . Encouraged focus on protein intake. Reports they have been doing better with diet and using air fryer. . Encouraged to reschedule pharmacologic stress test per cardiology. Canceled appointment due to GI illness. . Will collaborate w/ CCM team for continued support.  Patient Self Care Activities:  . Patient will take medications as prescribed . Patient will schedule f/u with specialty providers and attend these appointments  Please see past updates related to this goal by clicking on the "Past Updates" button in the selected goal          Plan:   Telephone follow up appointment with care management team member scheduled for:03/18/20 at 1:45   Mercer Pod. Tiburcio Pea PharmD, BCPS Clinical Pharmacist [facility] [phone]

## 2020-01-22 ENCOUNTER — Ambulatory Visit: Payer: Self-pay | Admitting: Licensed Clinical Social Worker

## 2020-01-22 NOTE — Chronic Care Management (AMB) (Addendum)
Care Management   Follow Up Note   01/22/2020 Name: Cheryl Chandler MRN: [id number] DOB: [birth year]  Referred by: [name], PA-C Reason for referral : Care Coordination   Cheryl Chandler is a 54 y.o. year old female who is a primary care patient of [name], [place]. The care management team was consulted for assistance with care management and care coordination needs.    Review of patient status, including review of consultants reports, relevant laboratory and other test results, and collaboration with appropriate care team members and the patient's provider was performed as part of comprehensive patient evaluation and provision of chronic care management services.    SDOH (Social Determinants of Health) assessments performed: Yes See Care Plan activities for detailed interventions related to [facility])     Advanced Directives: See Care Plan and Vynca application for related entries.   Goals Addressed    .  SW-"I need to focus on taking better care of myself" (pt-stated)        Current Barriers:  . Financial constraints . Mental Health Concerns  . Social Isolation . Limited education about mental health support resources that are available to her within the local area* . Cognitive Deficits . Lacks knowledge of community resource: Grief support resources/programs that are offered free of charge  within the area  Clinical Social Work Clinical Goal(s):  [name] Over the next 90 days, client will work with SW to address concerns related to experiencing ongoing symptoms of depression, sadness and grief since the passing of her mother . Over the next 90 days, patient will work with LCSW to address needs related to implementing appropriate self-care and depression management.   Interventions: . Patient interviewed and appropriate assessments performed . Provided mental health counseling with regards to patient's loss and ongoing stressors. Patient was very  close with her mother and she continues to experience symptoms of grief from this loss. Patient denies wanting LCSW to make a referral for grief counseling through [facility] again but was receptive to coping skill and resource education/information that was provided during session today. . Provided patient with information about managing depression within her daily life. Patient has ongoing grief symptoms as well. Patient states "I feel worn down." Patient reports that she talks out loud to her mother's picture and this is very helpful to her. Grief support resource education provided again during session today.  . Provided reflective listening and implemented appropriate interventions to help suppport patient and her emotional needs  . Discussed plans with patient for ongoing care management follow up and provided patient with direct contact information for care management team . Advised patient to contact CCM team with any urgent case management needs. . Assisted patient/caregiver with obtaining information about health plan benefits . LCSW completed past referral for family to have a wheelchair ramp built at their residence due to ongoing mobility concerns. Ramp has been successfully installed.  [name] PCS actively involved with patient and providing ongoing services.  [name] Positive reinforcement provided to patient for quitting smoking cigarettes. Coping skill review education provided for future triggers.  . Brief self-care education provided to both patient and spouse. . Patient confirms that she continues to go to food pantry for food insecurity. Resource information has been provided.  . Patient planted a tomato plant and successfully grew over 5 tomatoes. Patient is very proud of this achievement as this was her first time planting a tomato plant and is now interested in picking up gardening/planting as a  hobby. . Patient reports ongoing pain and issues with mobility. Patient shares that her pain  resides in her pains and legs. Emotional support and coping skill education provided. . Patient confirms that her PCS aide continues to provide her with services. However, aide has to be reminded to put on her mask since patient is high risk. [name] LCSW reviewed resources that [name] provided her with in the past.  . 08/30/19-Patient reports that she had a wonderful birthday spent with her family. Patient reports receiving appropriate socialization. Patient reports that her spouse took her out to eat and gifted her with several things which was a nice surprise.  . Patient confirms stable transportation arrangements for upcoming PCP appointment this week.  . Patient repots that she is losing a lot of weight but is not sure how much she has lost as she does not have a scale. Patient reports that a goal of hers is to lose weight but she has not been intentionally losing weight at this time.  . Patient reports that her aide [place] continues to assist her with PCS and is an excellent support within the home for her and spouse.  . 01/22/20-Patient reports recent stress and anxiety around dysfunction within her family. Patient states that she found out that her dad (step father that she refers to as dad) got re-married to a family friend who is in her early 70's. Patient reports that this family friend is someone that she does not wish to have a relationship with. LCSW educated patient on how to put in place healthy boundaries between family members.  . Patient reports that her aunt continues to be a positive support for her in her life and someone that she can rely on in the case of emergencies. Patient shares that aunt does not wish to associate with her dad's new wife either.   Patient Self Care Activities:  . Attends all scheduled provider appointments . Calls provider office for new concerns or questions  Please see past updates related to this goal by clicking on the "Past Updates" button in the selected  goal       The care management team will reach out to the patient again over the next 60-90 days.   [name], BSW, MSW, LCSW [facility] Care Management [place]  [facility] [email or web address] Phone: [phone]

## 2020-01-24 ENCOUNTER — Ambulatory Visit: Payer: Self-pay | Admitting: General Practice

## 2020-01-24 ENCOUNTER — Telehealth: Payer: Self-pay | Admitting: General Practice

## 2020-01-24 DIAGNOSIS — R7301 Impaired fasting glucose: Secondary | ICD-10-CM

## 2020-01-24 DIAGNOSIS — R600 Localized edema: Secondary | ICD-10-CM

## 2020-01-24 DIAGNOSIS — E785 Hyperlipidemia, unspecified: Secondary | ICD-10-CM

## 2020-01-24 DIAGNOSIS — J449 Chronic obstructive pulmonary disease, unspecified: Secondary | ICD-10-CM

## 2020-01-24 DIAGNOSIS — I890 Lymphedema, not elsewhere classified: Secondary | ICD-10-CM

## 2020-01-24 DIAGNOSIS — I10 Essential (primary) hypertension: Secondary | ICD-10-CM

## 2020-01-24 NOTE — Patient Instructions (Signed)
Visit Information  Goals Addressed              This Visit's Progress   .  RN- I need more help in the home (pt-stated)        Current Barriers:  [name] Knowledge Deficits related to managing multiple chronic diseases  . Lacks caregiver support.  . Corporate treasurer.  . Transportation barriers- patient depends on others for transporation  Nurse Case Manager Clinical Goal(s):  [name] Over the next 120 days, patient will work with [facility]  to address needs related to Multiple chronic Disease Processes . Over the next 120 days, patient will work with [facility]  (community agency) to Increase Strength and regain some independence   Interventions:  . Discussed plans with patient for ongoing care management follow up and provided patient with direct contact information for care management team  . Evaluated current home/life routine and her husband is home and doing well, but still can not drive. No transportation barriers as her husbands Dad comes and takes them to appointments.  . Evaluated help in the home and the patient has an aide that comes in and helps with her needs.  The patient has an aide that comes in every Thursday.  She is doing well with this. 01-24-2020 The patient still has help in the home.  The patient is currently having a lot of stressors in her life due to family issues going on. Her dad called last week and let her know that he had gotten remarried and had a new family now. This really upset her even though she knew it was coming.  . Evaluation of SDOH.  Has holes in the floor and needs help with getting the floor fixed. Has supplies. Has been calling area churches but has not had any luck with finding someone to do the work. Will see if  resources are available to help with expressed needs. 01-24-2020: the patient states the holes are still in the floor. The patient states that their phones had been messed up and then they had to get different ones. The patient states that  she lost the number to the friend that was helping them with this. Willies dad was going to help with fixing the floor. She does not know what the status of the floor being fixed is.    Patient Self Care Activities:   . Currently UNABLE TO independently perform ADLs or IADLs   Please see past updates related to this goal by clicking on the "Past Updates" button in the selected goal      .  RN: My blood pressure is sometimes high        Current Barriers:  . Chronic Disease Management support and education needs related to management of HTN, IFG,  and other chronic disease processes  Nurse Case Manager Clinical Goal(s):  [name] Over the next 120 days, patient will work with [facility] and pcp to address needs related to increased stress impacting her health and well being related to managing her blood pressure . Over the next 120 days, patient will attend all scheduled medical appointments: Cardiology appointment on 2/8 and pcp on 2/10 . Over the next 120 days, patient will verbalize basic understanding of HTN and heart disease process and self health management plan as evidenced by managing blood pressure more effectively  Interventions:  . Evaluation of current treatment plan related to HTN and IFG, and patient's adherence to plan as established by provider. 01-24-2020: The patient states  that she had to cancel her appointment with the cardiologist due to not feeling well after the covid vaccine. The patient will rescheduled. She goes next month to see the "leg doctor". She is still having a lot of swelling in her legs.  She states the Lasix has helped a lot but she is still noticing swelling and sometimes they are painful.  . Provided education to patient re: exercise and heart healthy diet.  The patient endorses watching her diet but since going on Ozempic she has not had an appetite.  Education on eating heart healthy/Ada diet. The patient is tolerating the 0.25mg  because the 0.5mg  makes her stomach  upset. 01-24-2020: Education on writing down questions for the provider and keeping appointments.  . Reviewed medications with patient and discussed compliance (also working with CCM pharmacist) . Evaluation of blood pressure readings. The patient has blood pressure readings up and down. The patient does get headaches at times.  The patient admits she does not always watch the sodium in the foods.  Education and support given.  01-24-2020: the patient states her blood pressures are still up and down. Readings given: 185/80-85, 190/80-85 and 162/78.  Education on managing blood pressure to prevent risk of heart attack and stroke.  . Evaluation of swelling. The patient has not been able to wear compression stocking. The patient says it causes her to have blisters on her legs.  The patient is elevating her legs and using her lymphedema.  . Discussed plans with patient for ongoing care management follow up and provided patient with direct contact information for care management team . Advised patient, providing education and rationale, to monitor blood pressure daily and record, calling pcp for findings outside established parameters.  . Reviewed scheduled/upcoming provider appointments including: pcp appointment on 03-06-2020 . Social Work referral for stressors in her life, loss of her mom and her dad's current lifestyle- the patients dad is getting remarried and this has really been hard on her,  and other factors impacting her health- working with LCSW . Pharmacy referral for ongoing support for medication management   Patient Self Care Activities:  . Patient verbalizes understanding of plan to manage HTN more effectively by institution of life style changes: diet, exercise, reducing stressors . Self administers medications as prescribed . Attends all scheduled provider appointments . Calls provider office for new concerns or questions . Unable to independently manage HTN and other chronic health  conditions  Please see past updates related to this goal by clicking on the "Past Updates" button in the selected goal         Patient verbalizes understanding of instructions provided today.   Telephone follow up appointment with care management team member scheduled for: 04-03-2020 at 230pm  Alto Denver RN, MSN, CCM Community Care Coordinator [place]  [facility] Mobile: [phone]

## 2020-01-24 NOTE — Chronic Care Management (AMB) (Signed)
Care Management   Follow Up Note   01/24/2020 Name: Cheryl Chandler MRN: [id number] DOB: 30-Dec-1965  Referred by: [name], PA-C Reason for referral : Care Coordination (RNCM Follow up for Chronic Disease Management and Care Coordination Needs)   Cheryl Chandler is a 54 y.o. year old female who is a primary care patient of [name], PA-C. The care management team was consulted for assistance with care management and care coordination needs.    Review of patient status, including review of consultants reports, relevant laboratory and other test results, and collaboration with appropriate care team members and the patient's provider was performed as part of comprehensive patient evaluation and provision of chronic care management services.    SDOH (Social Determinants of Health) assessments performed: Yes See Care Plan activities for detailed interventions related to [facility])     Advanced Directives: See Care Plan and Vynca application for related entries.  BP Readings from Last 3 Encounters:  12/06/19 (!) 162/78  09/06/19 130/66  08/17/19 (!) 144/65    Goals Addressed              This Visit's Progress   .  RN- I need more help in the home (pt-stated)        Current Barriers:  [name] Knowledge Deficits related to managing multiple chronic diseases  . Lacks caregiver support.  . Corporate treasurer.  . Transportation barriers- patient depends on others for transporation  Nurse Case Manager Clinical Goal(s):  [name] Over the next 120 days, patient will work with [facility]  to address needs related to Multiple chronic Disease Processes . Over the next 120 days, patient will work with [facility]  (community agency) to Increase Strength and regain some independence   Interventions:  . Discussed plans with patient for ongoing care management follow up and provided patient with direct contact information for care management team  . Evaluated  current home/life routine and her husband is home and doing well, but still can not drive. No transportation barriers as her husbands Dad comes and takes them to appointments.  . Evaluated help in the home and the patient has an aide that comes in and helps with her needs.  The patient has an aide that comes in every Thursday.  She is doing well with this. 01-24-2020 The patient still has help in the home.  The patient is currently having a lot of stressors in her life due to family issues going on. Her dad called last week and let her know that he had gotten remarried and had a new family now. This really upset her even though she knew it was coming.  . Evaluation of SDOH.  Has holes in the floor and needs help with getting the floor fixed. Has supplies. Has been calling area churches but has not had any luck with finding someone to do the work. Will see if  resources are available to help with expressed needs. 01-24-2020: the patient states the holes are still in the floor. The patient states that their phones had been messed up and then they had to get different ones. The patient states that she lost the number to the friend that was helping them with this. Willies dad was going to help with fixing the floor. She does not know what the status of the floor being fixed is.    Patient Self Care Activities:   . Currently UNABLE TO independently perform ADLs or IADLs   Please see  past updates related to this goal by clicking on the "Past Updates" button in the selected goal      .  RN: My blood pressure is sometimes high        Current Barriers:  . Chronic Disease Management support and education needs related to management of HTN, IFG,  and other chronic disease processes  Nurse Case Manager Clinical Goal(s):  [name] Over the next 120 days, patient will work with [facility] and pcp to address needs related to increased stress impacting her health and well being related to managing her blood pressure . Over the  next 120 days, patient will attend all scheduled medical appointments: Cardiology appointment on 2/8 and pcp on 2/10 . Over the next 120 days, patient will verbalize basic understanding of HTN and heart disease process and self health management plan as evidenced by managing blood pressure more effectively  Interventions:  . Evaluation of current treatment plan related to HTN and IFG, and patient's adherence to plan as established by provider. 01-24-2020: The patient states that she had to cancel her appointment with the cardiologist due to not feeling well after the covid vaccine. The patient will rescheduled. She goes next month to see the "leg doctor". She is still having a lot of swelling in her legs.  She states the Lasix has helped a lot but she is still noticing swelling and sometimes they are painful.  . Provided education to patient re: exercise and heart healthy diet.  The patient endorses watching her diet but since going on Ozempic she has not had an appetite.  Education on eating heart healthy/Ada diet. The patient is tolerating the 0.25mg  because the 0.5mg  makes her stomach upset. 01-24-2020: Education on writing down questions for the provider and keeping appointments.  . Reviewed medications with patient and discussed compliance (also working with CCM pharmacist) . Evaluation of blood pressure readings. The patient has blood pressure readings up and down. The patient does get headaches at times.  The patient admits she does not always watch the sodium in the foods.  Education and support given.  01-24-2020: the patient states her blood pressures are still up and down. Readings given: 185/80-85, 190/80-85 and 162/78.  Education on managing blood pressure to prevent risk of heart attack and stroke.  . Evaluation of swelling. The patient has not been able to wear compression stocking. The patient says it causes her to have blisters on her legs.  The patient is elevating her legs and using her  lymphedema.  . Discussed plans with patient for ongoing care management follow up and provided patient with direct contact information for care management team . Advised patient, providing education and rationale, to monitor blood pressure daily and record, calling pcp for findings outside established parameters.  . Reviewed scheduled/upcoming provider appointments including: pcp appointment on 03-06-2020 . Social Work referral for stressors in her life, loss of her mom and her dad's current lifestyle- the patients dad is getting remarried and this has really been hard on her,  and other factors impacting her health- working with LCSW . Pharmacy referral for ongoing support for medication management   Patient Self Care Activities:  . Patient verbalizes understanding of plan to manage HTN more effectively by institution of life style changes: diet, exercise, reducing stressors . Self administers medications as prescribed . Attends all scheduled provider appointments . Calls provider office for new concerns or questions . Unable to independently manage HTN and other chronic health conditions  Please see  past updates related to this goal by clicking on the "Past Updates" button in the selected goal          Telephone follow up appointment with care management team member scheduled for: 04/03/2020 at 230pm  Alto Denver RN, MSN, CCM Community Care Coordinator [place]  [facility] Mobile: [phone]

## 2020-01-25 ENCOUNTER — Ambulatory Visit: Payer: Self-pay | Admitting: Licensed Clinical Social Worker

## 2020-01-25 NOTE — Chronic Care Management (AMB) (Addendum)
  Care Management   Follow Up Note   01/25/2020 Name: Cheryl Chandler MRN: [id number] DOB: 1965/04/29  Referred by: [name], PA-C Reason for referral : Care Coordination   [name] is a 54 y.o. year old female who is a primary care patient of [name], [place]. The care management team was consulted for assistance with care management and care coordination needs.    Review of patient status, including review of consultants reports, relevant laboratory and other test results, and collaboration with appropriate care team members and the patient's provider was performed as part of comprehensive patient evaluation and provision of chronic care management services.    LCSW received incoming call and voice message from patient on 01/25/20. CCM RNCM received a call and message as well. Patient was wanting to confirm her upcoming CCM appointments as she forgot to write them today. Text message sent with these days for her to revert back.  The care management team will reach out to the patient again over the next quarter.   [name], BSW, MSW, LCSW [facility] Care Management [place]  [facility] [email or web address] Phone: [phone]

## 2020-01-26 DIAGNOSIS — Z419 Encounter for procedure for purposes other than remedying health state, unspecified: Secondary | ICD-10-CM | POA: Diagnosis not present

## 2020-01-27 ENCOUNTER — Other Ambulatory Visit: Payer: Self-pay | Admitting: Family Medicine

## 2020-01-27 NOTE — Telephone Encounter (Signed)
Requested Prescriptions  Pending Prescriptions Disp Refills  . Semaglutide,0.25 or 0.5MG /DOS, (OZEMPIC, 0.25 OR 0.5 MG/DOSE,) 2 MG/1.5ML SOPN [Pharmacy Med Name: OZEMPIC (0.25 OR 0.5 MG/DOSE) 2 MG/] 1.5 mL 2    Sig: INJECT 0.5MG  SUBQ ONCE A WEEK     Endocrinology:  Diabetes - GLP-1 Receptor Agonists Passed - 01/27/2020  3:37 PM      Passed - HBA1C is between 0 and 7.9 and within 180 days    Hgb A1c MFr Bld  Date Value Ref Range Status  09/06/2019 6.1 (H) 4.8 - 5.6 % Final    Comment:             Prediabetes: 5.7 - 6.4          Diabetes: >6.4          Glycemic control for adults with diabetes: <7.0          Passed - Valid encounter within last 6 months    Recent Outpatient Visits          1 month ago Lymphedema   [facility] [name], [place]   2 months ago Primary insomnia   [facility] Roosvelt Maser [place], [place]   4 months ago IFG (impaired fasting glucose)   [facility], [place], [place]   7 months ago Upper respiratory tract infection, unspecified type   [facility], [place], [place]   8 months ago Essential hypertension   [facility] [place], [place], [place]      Future Appointments            In 1 month [name], Dorie Rank, NP [employer], [facility]

## 2020-01-27 NOTE — Telephone Encounter (Signed)
Requested Prescriptions  Pending Prescriptions Disp Refills  . meloxicam (MOBIC) 15 MG tablet [Pharmacy Med Name: MELOXICAM 15 MG TAB] 30 tablet 0    Sig: TAKE 1 TABLET BY MOUTH ONCE DAILY AS NEEDED WITH FOOD OR MILK     Analgesics:  COX2 Inhibitors Passed - 01/27/2020  3:40 PM      Passed - HGB in normal range and within 360 days    Hemoglobin  Date Value Ref Range Status  05/03/2019 12.9 11.1 - 15.9 g/dL Final         Passed - Cr in normal range and within 360 days    Creatinine  Date Value Ref Range Status  08/05/2014 0.86 mg/dL Final    Comment:    0.62-6.94 NOTE: New Reference Range  07/03/14    Creatinine, Ser  Date Value Ref Range Status  09/06/2019 0.73 0.57 - 1.00 mg/dL Final         Passed - Patient is not pregnant      Passed - Valid encounter within last 12 months    Recent Outpatient Visits          1 month ago Lymphedema   [facility] [name], [place]   2 months ago Primary insomnia   [facility] Roosvelt Maser [place], [place]   4 months ago IFG (impaired fasting glucose)   [facility], [place], [place]   7 months ago Upper respiratory tract infection, unspecified type   [facility], [place], [place]   8 months ago Essential hypertension   [facility] [place], [place], [place]      Future Appointments            In 1 month [name], Dorie Rank, NP [employer], [facility]

## 2020-02-01 ENCOUNTER — Other Ambulatory Visit: Payer: Self-pay | Admitting: Family Medicine

## 2020-02-01 DIAGNOSIS — M62838 Other muscle spasm: Secondary | ICD-10-CM

## 2020-02-01 NOTE — Telephone Encounter (Signed)
Requested medication (s) are due for refill today: yes  Requested medication (s) are on the active medication list:yes  Last refill:  robaxin 11/08/19 #90 0 refills, nitroglycerin 05/03/19  #25 2 refills, Kenalog 0.1%  #60  0 refills  Future visit scheduled:yes one month with Jolene [name]  Notes to clinic:  Last ordered by Roosvelt Maser  Patient has not yet seen another provider.    Requested Prescriptions  Pending Prescriptions Disp Refills   methocarbamol (ROBAXIN) 750 MG tablet [Pharmacy Med Name: METHOCARBAMOL 750 MG TAB] 90 tablet 0    Sig: TAKE 1-2 TABLETS BY MOUTH 3 TIMES DAILY AS NEEDED      Not Delegated - Analgesics:  Muscle Relaxants Failed - 02/01/2020 11:06 AM      Failed - This refill cannot be delegated      Passed - Valid encounter within last 6 months    Recent Outpatient Visits           1 month ago Lymphedema   [facility] [name], [place]   2 months ago Primary insomnia   [facility] Roosvelt Maser [place], [place]   4 months ago IFG (impaired fasting glucose)   [facility], [place], [place]   7 months ago Upper respiratory tract infection, unspecified type   [facility] Roosvelt Maser [place], [place]   9 months ago Essential hypertension   [facility] [place], [place], [place]       Future Appointments             In 1 month [name], Dorie Rank, NP [employer]              nitroGLYCERIN (NITROSTAT) 0.4 MG SL tablet [Pharmacy Med Name: NITROGLYCERIN 0.4 MG SL TAB] 25 tablet 2    Sig: DISSOLVE 1 TABLET UNDER TONGUE AT ONSET OF CHEST PAIN. REPEAT IN 5 MIN IF NOT RESOLVED, MAX 3 DOSES. 911 IF NEEDED.      Cardiovascular:  Nitrates Failed - 02/01/2020 11:06 AM      Failed - Last BP in normal range    BP Readings from Last 1 Encounters:  12/06/19 (!) 162/78          Passed - Last Heart Rate in normal range    Pulse Readings from Last 1 Encounters:   12/06/19 81          Passed - Valid encounter within last 12 months    Recent Outpatient Visits           1 month ago Lymphedema   [facility] [name], [place]   2 months ago Primary insomnia   [facility] Roosvelt Maser [place], [place]   4 months ago IFG (impaired fasting glucose)   [facility], [place], [place]   7 months ago Upper respiratory tract infection, unspecified type   [facility] Roosvelt Maser [place], [place]   9 months ago Essential hypertension   [facility] [place], [place], [place]       Future Appointments             In 1 month [name], Dorie Rank, NP [employer]              triamcinolone cream (KENALOG) 0.1 % [Pharmacy Med Name: TRIAMCINOLONE ACETON 0.1% TOP CREAM] 60 g 0    Sig: APPLY 1 APPLICATION TOPICALLY TWICE DAILY      Dermatology:  Corticosteroids  Passed - 02/01/2020 11:06 AM      Passed - Valid encounter within last 12 months    Recent Outpatient Visits           1 month ago Lymphedema   [facility] [name], [place]   2 months ago Primary insomnia   [facility] Roosvelt Maser [place], [place]   4 months ago IFG (impaired fasting glucose)   [facility], [place], [place]   7 months ago Upper respiratory tract infection, unspecified type   [facility] Roosvelt Maser [place], [place]   9 months ago Essential hypertension   [facility] [place], [place], [place]       Future Appointments             In 1 month [name], Dorie Rank, NP [employer]

## 2020-02-05 ENCOUNTER — Ambulatory Visit (INDEPENDENT_AMBULATORY_CARE_PROVIDER_SITE_OTHER): Payer: Medicaid Other | Admitting: Vascular Surgery

## 2020-02-26 DIAGNOSIS — Z419 Encounter for procedure for purposes other than remedying health state, unspecified: Secondary | ICD-10-CM | POA: Diagnosis not present

## 2020-02-28 ENCOUNTER — Other Ambulatory Visit: Payer: Self-pay | Admitting: Nurse Practitioner

## 2020-02-28 ENCOUNTER — Other Ambulatory Visit: Payer: Self-pay | Admitting: Family Medicine

## 2020-02-28 DIAGNOSIS — J301 Allergic rhinitis due to pollen: Secondary | ICD-10-CM

## 2020-02-28 DIAGNOSIS — R0602 Shortness of breath: Secondary | ICD-10-CM

## 2020-02-28 DIAGNOSIS — R059 Cough, unspecified: Secondary | ICD-10-CM

## 2020-02-28 DIAGNOSIS — E039 Hypothyroidism, unspecified: Secondary | ICD-10-CM

## 2020-03-01 ENCOUNTER — Encounter: Payer: Self-pay | Admitting: Nurse Practitioner

## 2020-03-06 ENCOUNTER — Ambulatory Visit: Payer: Medicaid Other | Admitting: Nurse Practitioner

## 2020-03-08 ENCOUNTER — Telehealth: Payer: Self-pay | Admitting: Family Medicine

## 2020-03-08 NOTE — Telephone Encounter (Signed)
Called to schedule patient to see a member of the Managed Medicaid Care Team but the patient is already working with CCM.

## 2020-03-18 ENCOUNTER — Ambulatory Visit: Payer: Self-pay | Admitting: Pharmacist

## 2020-03-18 NOTE — Chronic Care Management (AMB) (Signed)
Chronic Care Management   Follow Up Note   03/18/2020 Name: Cheryl Chandler MRN: [id number] DOB: [birth year]  Referred by: [name], PA-C Reason for referral : No chief complaint on file.   Cheryl Chandler is a 54 y.o. year old female who is a primary care patient of [name], [place]. The CCM team was consulted for assistance with chronic disease management and care coordination needs.    Review of patient status, including review of consultants reports, relevant laboratory and other test results, and collaboration with appropriate care team members and the patient's provider was performed as part of comprehensive patient evaluation and provision of chronic care management services.    SDOH (Social Determinants of Health) assessments performed: No See Care Plan activities for detailed interventions related to [facility])     Outpatient Encounter Medications as of 03/18/2020  Medication Sig Note  . albuterol (PROVENTIL) (2.5 MG/3ML) 0.083% nebulizer solution INHALE CONTENTS OF 1 VIAL IN NEBULIZER EVERY 4 HOURS AS NEEDED   . citalopram (CELEXA) 20 MG tablet TAKE 1 TABLET BY MOUTH ONCE DAILY   . EPINEPHrine 0.3 mg/0.3 mL IJ SOAJ injection USE AS DIRECTED. 09/06/2019: Has if needed  . furosemide (LASIX) 20 MG tablet TAKE 1 TABLET BY MOUTH ONCE DAILY IN AFTERNOON AS NEEDED LEG EDEMA   . furosemide (LASIX) 40 MG tablet Take 1 tablet (40 mg total) by mouth daily.   [name] gabapentin (NEURONTIN) 300 MG capsule TAKE 1 CAPSULE BY MOUTH 3 TIMES DAILY ASNEEDED   . levothyroxine (SYNTHROID) 50 MCG tablet TAKE 1 TABLET BY MOUTH ONCE DAILY ON AN EMPTY STOMACH. WAIT 30 MINUTES BEFORE TAKING OTHER MEDS.   [name] lisinopril (ZESTRIL) 40 MG tablet TAKE 1 TABLET BY MOUTH ONCE DAILY   . meloxicam (MOBIC) 15 MG tablet TAKE 1 TABLET BY MOUTH ONCE DAILY AS NEEDED WITH FOOD OR MILK   . methocarbamol (ROBAXIN) 750 MG tablet TAKE 1-2 TABLETS BY MOUTH 3 TIMES DAILY AS NEEDED   .  mometasone-formoterol (DULERA) 100-5 MCG/ACT AERO INHALE 2 PUFFS BY MOUTH TWICE A DAY. RINSE MOUTH AFTER USE.   . montelukast (SINGULAIR) 10 MG tablet TAKE 1 TABLET BY MOUTH AT BEDTIME   . nitroGLYCERIN (NITROSTAT) 0.4 MG SL tablet DISSOLVE 1 TABLET UNDER TONGUE AT ONSET OF CHEST PAIN. REPEAT IN 5 MIN IF NOT RESOLVED, MAX 3 DOSES. 911 IF NEEDED.   [name] omeprazole (PRILOSEC) 20 MG capsule Take 1 capsule (20 mg total) by mouth daily.   . QUEtiapine (SEROQUEL) 25 MG tablet Take 1 tablet (25 mg total) by mouth at bedtime.   . Semaglutide,0.25 or 0.5MG /DOS, (OZEMPIC, 0.25 OR 0.5 MG/DOSE,) 2 MG/1.5ML SOPN INJECT 0.5MG  SUBQ ONCE A WEEK   . simvastatin (ZOCOR) 40 MG tablet TAKE 1 TABLET BY MOUTH AT BEDTIME   . SPIRIVA HANDIHALER 18 MCG inhalation capsule INHALE CONTENTS OF 1 CAPSULE ONCE DAILY   . traMADol (ULTRAM) 50 MG tablet Take 1 tablet (50 mg total) by mouth daily as needed.   . triamcinolone cream (KENALOG) 0.1 % APPLY 1 APPLICATION TOPICALLY TWICE DAILY   . Vitamin D, Ergocalciferol, (DRISDOL) 1.25 MG (50000 UNIT) CAPS capsule TAKE 1 CAPSULE BY MOUTH ONCE EVERY 7 DAYS    No facility-administered encounter medications on file as of 03/18/2020.     Objective:   Goals Addressed              This Visit's Progress   .  PharmD "I have depression, anxiety, and chronic pain" (pt-stated)  CARE PLAN ENTRY (see longtitudinal plan of care for additional care plan information)  Current Barriers:  . Polypharmacy; complex patient with multiple comorbidities including HTN, venous insufficiency, COPD, peripheral neuropathy, depression/anxiety, arthritis.  o Reports that she has been losing weight since starting Ozempic. She reports her wedding ring size has gone form 14 to 9. She doesn't have scales but her stomach is noticeable flatter per her report. o Reports they have not gotten the floor fixed in their trailer. They have the supplies and father-in-law is arranging the repair. She states they  just avoid going in the back room.  o Sister was planning to help her with disability application. She reports her sister has been "busy" and they have had some issues with items being taken from their mailbox and sister wants to complete online.  . Self-manages medications by using a pill box o Pre-diabetes/weight management: A1c 6.1%, Ozempic 0.5 mg weekly  - Eating salads, cooking meals with air fryer or baking - Drinking water w/ flavoring packets - Eating more [place], lean meats  - Has been doing her therapy exercises to help with her legs. - Reports a couple episodes of "shakiness" releived by eating. Will consult with PCP to help her obtain meter and supplies o HTN/ASCVD risk reduction: lisinopril 40 mg daily; simvastatin 40 mg daily; follows w/ Dr. Milta Deiters; Missed appt for echo o Venous insufficiency: furosemide 40 mg daily; follows w/ Vascular; missed last 2 appointments with vascular; using lymphedema pumps. Notes that compression stockings that she previously used irritated her skin o COPD/Asthma: montelukast 10 mg daily; Spiriva 18 mcg daily; Dulera 200/50 mcg BID, albuterol nebulizer PRN o Peripheral neuropathy/pain: gabapentin 300 mg, meloxicam 15 mg, methocarbamol 750 mg daily o Depression/anxiety: citalopram 20 mg daily  - Struggling w/ family stressors lately. o Hypothyroid: levothyroxine 50 mcg o GERD: omeprazole 20 mg daily  Pharmacist Clinical Goal(s):  [name] Over the next 90 days, patient will work with PharmD and provider towards optimized medication management  Interventions: . Comprehensive medication review performed, medication list updated in electronic medical record . Inter-disciplinary care team collaboration (see longitudinal plan of care) . Provided empathetic listening as patient discussed current social stressors.  . Encouraged focus on protein intake. Reports they have been doing better with diet and using air fryer. . Encouraged to reschedule missed  appoinments . Will collaborate w/ CCM team for continued support  Patient Self Care Activities:  . Patient will take medications as prescribed . Patient will schedule f/u with specialty providers and attend these appointments  Please see past updates related to this goal by clicking on the "Past Updates" button in the selected goal         Diabetes   A1c goal <6.5%  Recent Relevant Labs: Lab Results  Component Value Date/Time   HGBA1C 6.1 (H) 09/06/2019 02:24 PM   HGBA1C 5.7 (H) 05/03/2019 01:35 PM    Last diabetic Eye exam: No results found for: HMDIABEYEEXA  Last diabetic Foot exam: No results found for: HMDIABFOOTEX   Checking BG: Never   Patient has failed these meds in past: NA Patient is currently controlled on the following medications: . Ozempic 0.5 mg qweek  We discussed: Diet and exercise extensively. Divina reports she and Will have cut out soft drinks completely. She reports losing weight on Ozempic but does not have scales at home to monitor. She notes she is not able to wear her pinkie ring on her wedding ring finger. She does report some episodes  of "shaking and I have to get me something to eat'. She has not checked her BG during these episodes. Will discuss ordering a BG monitor and supplies with her PCP. I have requested that she use Will's glucometer  to check her BG if this recurs. She states they missed their most recent  Appointments due to car trouble.  Plan  Continue current medications.     Hypertension   BP goal is:  <130/80  Office blood pressures are  BP Readings from Last 3 Encounters:  12/06/19 (!) 162/78  09/06/19 130/66  08/17/19 (!) 144/65   Patient checks BP at home daily Patient home BP readings are ranging: 160-195/60-95  Patient has failed these meds in the past:NA Patient is currently query  controlled on the following medications:  . Lisinopril 40 mg qd . Furosemide 20 mg daily  Prn  edema  We discussed Harlei reports she  can not tolerate compression hose but uses her lymph edema pumps and elevates her legs. She is no longer adding salt to food or drinking soda. She states her BP is often elevates and reports family stressors. Reviewed proper BP technique incldung sitting down for at least 5 minutes before, resting calmly, with your feet flat on the floor. She denies any missed doses.     Plan  Continue current medications and control with diet and exercise       Plan:   Telephone follow up appointment with care management team member scheduled for: 2 months   Mercer Pod. Tiburcio Pea PharmD, BCPS Clinical Pharmacist [facility] [phone]

## 2020-03-27 ENCOUNTER — Other Ambulatory Visit: Payer: Self-pay | Admitting: Nurse Practitioner

## 2020-03-27 ENCOUNTER — Other Ambulatory Visit: Payer: Self-pay | Admitting: Family Medicine

## 2020-03-27 DIAGNOSIS — J301 Allergic rhinitis due to pollen: Secondary | ICD-10-CM

## 2020-03-27 DIAGNOSIS — R0602 Shortness of breath: Secondary | ICD-10-CM

## 2020-03-27 DIAGNOSIS — R059 Cough, unspecified: Secondary | ICD-10-CM

## 2020-03-27 DIAGNOSIS — E039 Hypothyroidism, unspecified: Secondary | ICD-10-CM

## 2020-03-27 DIAGNOSIS — Z419 Encounter for procedure for purposes other than remedying health state, unspecified: Secondary | ICD-10-CM | POA: Diagnosis not present

## 2020-03-27 NOTE — Telephone Encounter (Signed)
Pt. Needs refill to get to appointment.

## 2020-03-27 NOTE — Telephone Encounter (Signed)
Requested medication (s) are due for refill today - yes  Requested medication (s) are on the active medication list -yes  Future visit scheduled -not with provider  Last refill: 01/02/20 #12  Notes to clinic: Request for non delegated Rx- patient has not established with new provider  Requested Prescriptions  Pending Prescriptions Disp Refills   Vitamin D, Ergocalciferol, (DRISDOL) 1.25 MG (50000 UNIT) CAPS capsule [Pharmacy Med Name: VITAMIN D (ERGOCALCIFEROL) 1.25 MG] 12 capsule 0    Sig: TAKE 1 CAPSULE BY MOUTH ONCE EVERY 7 DAYS      Endocrinology:  Vitamins - Vitamin D Supplementation Failed - 03/27/2020  2:40 PM      Failed - 50,000 IU strengths are not delegated      Failed - Phosphate in normal range and within 360 days    No results found for: PHOS        Failed - Vitamin D in normal range and within 360 days    Vit D, 25-Hydroxy  Date Value Ref Range Status  09/06/2019 20.7 (L) 30.0 - 100.0 ng/mL Final    Comment:    Vitamin D deficiency has been defined by the Institute of Medicine and an Endocrine Society practice guideline as a level of serum 25-OH vitamin D less than 20 ng/mL (1,2). The Endocrine Society went on to further define vitamin D insufficiency as a level between 21 and 29 ng/mL (2). 1. IOM (Institute of Medicine). 2010. Dietary reference    intakes for calcium and D. [place]: The    [employer]. 2. Holick MF, Binkley Parrish, Bischoff-Ferrari HA, et al.    Evaluation, treatment, and prevention of vitamin D    deficiency: an Endocrine Society clinical practice    guideline. JCEM. 2011 Jul; 96(7):1911-30.           Passed - Ca in normal range and within 360 days    Calcium  Date Value Ref Range Status  09/06/2019 8.8 8.7 - 10.2 mg/dL Final   Calcium, Total  Date Value Ref Range Status  08/05/2014 8.2 (L) mg/dL Final    Comment:    1.6-10.98.9-10.3 NOTE: New Reference Range  07/03/14           Passed - Valid encounter within last 12  months    Recent Outpatient Visits           3 months ago Lymphedema   [name], [name], [place]   4 months ago Primary insomnia   [facility] [place]velt Maser[place], [place], [place]   6 months ago IFG (impaired fasting glucose)   [facility] [place], [place], [place]   9 months ago Upper respiratory tract infection, unspecified type   [facility] [place]velt M[facility], Rachel [place], [place]   10 months ago Essential hypertension   [facility] Roosvelt MaserLane, Rachel [place], [place]               Signed Prescriptions Disp Refills   citalopram (CELEXA) 20 MG tablet 90 tablet 0    Sig: TAKE 1 TABLET BY MOUTH ONCE DAILY      Psychiatry:  Antidepressants - SSRI Passed - 03/27/2020  2:40 PM      Passed - Completed PHQ-2 or PHQ-9 in the last 360 days      Passed - Valid encounter within last 6 months    Recent Outpatient Visits           3 months ago Lymphedema   [facility] [place],  Salley Hews, PA-C   4 months ago Primary insomnia   [facility] Roosvelt Maser [place], [place]   6 months ago IFG (impaired fasting glucose)    [facility] Roosvelt Maser [place], [place]   9 months ago Upper respiratory tract infection, unspecified type   [facility] Roosvelt Maser [place], [place]   10 months ago Essential hypertension   [facility] [name], [place]                simvastatin (ZOCOR) 40 MG tablet 90 tablet 0    Sig: TAKE 1 TABLET BY MOUTH AT BEDTIME      Cardiovascular:  Antilipid - Statins Failed - 03/27/2020  2:40 PM      Failed - LDL in normal range and within 360 days    LDL Chol Calc (NIH)  Date Value Ref Range Status  09/06/2019 77 0 - 99 mg/dL Final          Failed - HDL in normal range and within 360 days    HDL  Date Value Ref Range Status  09/06/2019 38 (L) >39 mg/dL Final          Passed - Total Cholesterol in normal range and within  360 days    Cholesterol, Total  Date Value Ref Range Status  09/06/2019 136 100 - 199 mg/dL Final          Passed - Triglycerides in normal range and within 360 days    Triglycerides  Date Value Ref Range Status  09/06/2019 113 0 - 149 mg/dL Final          Passed - Patient is not pregnant      Passed - Valid encounter within last 12 months    Recent Outpatient Visits           3 months ago Lymphedema   [facility] Roosvelt Maser [place], [place]   4 months ago Primary insomnia   [facility] [place], [place], [place]   6 months ago IFG (impaired fasting glucose)   [facility], [place], [place]   9 months ago Upper respiratory tract infection, unspecified type   [facility] Roosvelt Maser [place], [place]   10 months ago Essential hypertension   [facility] Roosvelt Maser [place], [place]                  Requested Prescriptions  Pending Prescriptions Disp Refills   Vitamin D, Ergocalciferol, (DRISDOL) 1.25 MG (50000 UNIT) CAPS capsule [Pharmacy Med Name: VITAMIN D (ERGOCALCIFEROL) 1.25 MG] 12 capsule 0    Sig: TAKE 1 CAPSULE BY MOUTH ONCE EVERY 7 DAYS      Endocrinology:  Vitamins - Vitamin D Supplementation Failed - 03/27/2020  2:40 PM      Failed - 50,000 IU strengths are not delegated      Failed - Phosphate in normal range and within 360 days    No results found for: PHOS        Failed - Vitamin D in normal range and within 360 days    Vit D, 25-Hydroxy  Date Value Ref Range Status  09/06/2019 20.7 (L) 30.0 - 100.0 ng/mL Final    Comment:    Vitamin D deficiency has been defined by the Institute of Medicine and an Endocrine Society practice guideline as a level of serum 25-OH vitamin D less than 20 ng/mL (1,2). The Endocrine Society went on to further define vitamin D insufficiency  as a level between 21 and 29 ng/mL (2). 1. IOM (Institute of Medicine). 2010. Dietary reference     intakes for calcium and D. [place]: The    [employer]. 2. Holick MF, Binkley [place], Bischoff-Ferrari HA, et al.    Evaluation, treatment, and prevention of vitamin D    deficiency: an Endocrine Society clinical practice    guideline. JCEM. 2011 Jul; 96(7):1911-30.           Passed - Ca in normal range and within 360 days    Calcium  Date Value Ref Range Status  09/06/2019 8.8 8.7 - 10.2 mg/dL Final   Calcium, Total  Date Value Ref Range Status  08/05/2014 8.2 (L) mg/dL Final    Comment:    1.6-10.9 NOTE: New Reference Range  07/03/14           Passed - Valid encounter within last 12 months    Recent Outpatient Visits           3 months ago Lymphedema   [facility] [name], [place]   4 months ago Primary insomnia   [facility] Roosvelt Maser [place], [place]   6 months ago IFG (impaired fasting glucose)   [facility], [place], [place]   9 months ago Upper respiratory tract infection, unspecified type   [facility] Roosvelt Maser [place], [place]   10 months ago Essential hypertension   [facility] Roosvelt Maser [place], [place]               Signed Prescriptions Disp Refills   citalopram (CELEXA) 20 MG tablet 90 tablet 0    Sig: TAKE 1 TABLET BY MOUTH ONCE DAILY      Psychiatry:  Antidepressants - SSRI Passed - 03/27/2020  2:40 PM      Passed - Completed PHQ-2 or PHQ-9 in the last 360 days      Passed - Valid encounter within last 6 months    Recent Outpatient Visits           3 months ago Lymphedema   [facility] [name], [place]   4 months ago Primary insomnia   [facility] Roosvelt Maser [place], [place]   6 months ago IFG (impaired fasting glucose)   [facility], [place], [place]   9 months ago Upper respiratory tract infection, unspecified type   [facility] Roosvelt Maser [place],  [place]   10 months ago Essential hypertension   [facility] [name], [place]                simvastatin (ZOCOR) 40 MG tablet 90 tablet 0    Sig: TAKE 1 TABLET BY MOUTH AT BEDTIME      Cardiovascular:  Antilipid - Statins Failed - 03/27/2020  2:40 PM      Failed - LDL in normal range and within 360 days    LDL Chol Calc (NIH)  Date Value Ref Range Status  09/06/2019 77 0 - 99 mg/dL Final          Failed - HDL in normal range and within 360 days    HDL  Date Value Ref Range Status  09/06/2019 38 (L) >39 mg/dL Final          Passed - Total Cholesterol in normal range and within 360 days    Cholesterol, Total  Date Value Ref Range Status  09/06/2019 136 100 - 199 mg/dL  Final          Passed - Triglycerides in normal range and within 360 days    Triglycerides  Date Value Ref Range Status  09/06/2019 113 0 - 149 mg/dL Final          Passed - Patient is not pregnant      Passed - Valid encounter within last 12 months    Recent Outpatient Visits           3 months ago Lymphedema   [facility] [name], [place]   4 months ago Primary insomnia   [facility] Roosvelt Maser [place], [place]   6 months ago IFG (impaired fasting glucose)   [facility] Roosvelt Maser [place], [place]   9 months ago Upper respiratory tract infection, unspecified type   [facility] Roosvelt Maser [place], [place]   10 months ago Essential hypertension   [facility] Roosvelt Maser [place], [place]

## 2020-03-28 ENCOUNTER — Telehealth: Payer: Self-pay

## 2020-03-28 NOTE — Telephone Encounter (Signed)
Patient's friend showed her how to use the meter.

## 2020-03-28 NOTE — Telephone Encounter (Signed)
PA for ozempic started today through Cover my meds, Awaiting determination

## 2020-03-28 NOTE — Telephone Encounter (Signed)
Lvm to make this apt. 

## 2020-03-28 NOTE — Telephone Encounter (Signed)
Pt stated she is having issues with meter did use husband meter and sugars were morning 128 and afternoon was 165 but she was having issues with her meter and has question about how to use it . Pt does have apt on 04/03/2020

## 2020-03-28 NOTE — Telephone Encounter (Signed)
Refused by: Hyman Bible, CMA   Refusal reason: Refill not appropriate (Needs blood work)

## 2020-03-29 ENCOUNTER — Telehealth: Payer: Self-pay | Admitting: Family Medicine

## 2020-03-29 MED ORDER — TRULICITY 0.75 MG/0.5ML ~~LOC~~ SOAJ: 0.7500 mg | SUBCUTANEOUS | 0 refills | Status: DC

## 2020-03-29 NOTE — Telephone Encounter (Signed)
Pt needs refill for Ozempic but insurance wont cover without a PA/ pharmacy advised to try Trulicy or victoza without Pa are covered/ please advise and send to [facility]

## 2020-03-29 NOTE — Telephone Encounter (Signed)
Overdue for appt

## 2020-03-29 NOTE — Telephone Encounter (Signed)
Pt has apt on 04/03/2020 scheduled

## 2020-03-29 NOTE — Telephone Encounter (Signed)
Can this be changed °

## 2020-03-29 NOTE — Telephone Encounter (Signed)
Pt verbalized understanding, Pt scheduled for 04/03/2020

## 2020-03-29 NOTE — Telephone Encounter (Signed)
Please get patient scheduled for a follow up 

## 2020-04-01 ENCOUNTER — Ambulatory Visit: Payer: Self-pay | Admitting: Licensed Clinical Social Worker

## 2020-04-01 NOTE — Chronic Care Management (AMB) (Addendum)
Care Management   Follow Up Note   04/01/2020 Name: Cheryl Chandler MRN: [id number] DOB: 21-May-1965  Cheryl Chandler is enrolled in a Managed Medicaid plan: Yes. Outreach attempt today was successful.    Referred by: [name], PA-C Reason for referral : Care Coordination   Cheryl Chandler is a 54 y.o. year old female who is a primary care patient of [name], [place]. The care management team was consulted for assistance with care management and care coordination needs.    Review of patient status, including review of consultants reports, relevant laboratory and other test results, and collaboration with appropriate care team members and the patient's provider was performed as part of comprehensive patient evaluation and provision of chronic care management services.    Patient Care Plan: General Social Work (Adult)    Problem Identified: Coping Skills (General Plan of Care)     Goal: Coping Skills Enhanced   Start Date: 04/01/2020  This Visit's Progress: On track  Priority: Medium  Note:   Evidence-based guidance:   Acknowledge, normalize and validate difficulty of making life-long lifestyle changes.   Identify current effective and ineffective coping strategies.   Encourage patient and caregiver participation in care to increase self-esteem, confidence and feelings of control.   Consider alternative and complementary therapy approaches such as meditation, mindfulness or yoga.   Encourage participation in cognitive behavioral therapy to foster a positive identity, increase self-awareness, as well as bolster self-esteem, confidence and self-efficacy.   Discuss spirituality; be present as concerns are identified; encourage journaling, prayer, worship services, meditation or pastoral counseling.   Encourage participation in pleasurable group activities such as hobbies, singing, sports or volunteering).   Encourage the use of  mindfulness; refer for training or intensive intervention.   Consider the use of meditative movement therapy such as tai chi, yoga or qigong.   Promote a regular daily exercise program based on tolerance, ability and patient choice to support positive thinking about disease or aging.   Notes:   Current Barriers:  . Financial constraints . Mental Health Concerns  . Social Isolation . Limited education about mental health support resources that are available to her within the local area* . Cognitive Deficits . Lacks knowledge of community resource  Clinical Social Work Clinical Goal(s):  [name] Over the next 90 days, client will work with SW to address concerns related to experiencing ongoing symptoms of depression, sadness and grief since the passing of her mother . Over the next 90 days, patient will work with LCSW to address needs related to implementing appropriate self-care and depression management.   Interventions: . Patient interviewed and appropriate assessments performed . New update- Patient has been out of her insulin for one week. She reports that her insurance denied coverage. She has upcoming office visit with new PCP at [facility] on 04/01/20. Patient confirms stable transportation to this appointment. . Provided mental health counseling with regards to patient's loss and ongoing stressors. Patient was very close with her mother and she continues to experience symptoms of grief from this loss. Patient denies wanting LCSW to make a referral for grief counseling through [facility] again but was receptive to coping skill and resource education/information that was provided during session today. Patient reports decorating for the holidays helps her to connect with her mother as they did this together when she was alive.  . Provided patient with information about managing depression within her daily life. Patient has ongoing grief symptoms as well. Patient states "  I feel worn down." Patient  reports that she talks out loud to her mother's picture and this is very helpful to her. Grief support resource education provided again during session today.  . Provided reflective listening and implemented appropriate interventions to help suppport patient and her emotional needs  . Discussed plans with patient for ongoing care management follow up and provided patient with direct contact information for care management team . Advised patient to contact CCM team with any urgent case management needs. . Assisted patient/caregiver with obtaining information about health plan benefits . LCSW completed past referral for family to have a wheelchair ramp built at their residence due to ongoing mobility concerns. Ramp has been successfully installed.  [name] PCS actively involved with patient and providing ongoing services.  [name] Positive reinforcement provided to patient for quitting smoking cigarettes. Coping skill review education provided for future triggers.  . Brief self-care education provided to both patient and spouse. . Patient confirms that she continues to go to food pantry for food insecurity. Resource information has been provided.  . Patient reports ongoing pain and issues with mobility. Patient shares that her pain resides in her pains and legs. Emotional support and coping skill education provided. [name] LCSW reviewed resources that [name] provided her with in the past.  . Patient reports that she had a wonderful birthday spent with her family this past May. Patient reports receiving appropriate socialization. Patient reports that her spouse took her out to eat and gifted her with several things which was a nice surprise.  . Patient confirms stable transportation arrangements for upcoming PCP appointment this week.  . Patient repots that she is losing a lot of weight but is not sure how much she has lost as she does not have a scale. Patient reports that a goal of hers is to lose weight but she has  not been intentionally losing weight at this time.  . Patient reports that her aide [name] stopped coming to their residence to provide PCS. Patient was advised to contact [facility] to request a new aide before her PCS case closes.  . Patient planted a tomato plant and successfully grew over 5 tomatoes over the summer. Patient is very proud of this achievement as this was her first time planting a tomato plant and is now interested in picking up gardening/planting as a hobby. . 01/22/20-Patient reports recent stress and anxiety around dysfunction within her family. Patient states that she found out that her dad (step father that she refers to as dad) got re-married to a family friend who is in her early 61's. Patient reports that this family friend is someone that she does not wish to have a relationship with. LCSW educated patient on how to put in place healthy boundaries between family members. Update 04/01/20-Patient reports that her aunt continues to be a positive support for her in her life and someone that she can rely on in the case of emergencies. Patient shares that her aunt does not wish to associate with her dad's new wife either.  . Patient was educated on healthy self-care skills and advised her to use these into her daily routine.   Patient Self Care Activities:  . Attends all scheduled provider appointments . Calls provider office for new concerns or questions  Please see past updates related to this goal by clicking on the "Past Updates" button in the selected goal      Task: Support Psychosocial Response to Risk or Actual Health  Condition   Note:   Care Management Activities:    - active listening utilized - caregiver stress acknowledged - counseling provided - current coping strategies identified - decision-making supported - healthy lifestyle promoted - journaling promoted - meditative movement therapy encouraged - mindfulness encouraged - participation in  counseling encouraged - problem-solving facilitated - relaxation techniques promoted - self-reflection promoted - spiritual activities promoted - verbalization of feelings encouraged    Notes:      The care management team will reach out to the patient again over the next quarter.  Eula Fried, BSW, MSW, [facility] Care Management [email or web address] Phone: [phone]

## 2020-04-03 ENCOUNTER — Encounter: Payer: Self-pay | Admitting: Family Medicine

## 2020-04-03 ENCOUNTER — Telehealth: Payer: Self-pay | Admitting: General Practice

## 2020-04-03 ENCOUNTER — Ambulatory Visit (INDEPENDENT_AMBULATORY_CARE_PROVIDER_SITE_OTHER): Payer: Medicaid Other | Admitting: Family Medicine

## 2020-04-03 ENCOUNTER — Other Ambulatory Visit: Payer: Self-pay

## 2020-04-03 ENCOUNTER — Telehealth: Payer: Self-pay

## 2020-04-03 VITALS — BP 150/80 | HR 73 | Temp 98.2°F

## 2020-04-03 DIAGNOSIS — Z114 Encounter for screening for human immunodeficiency virus [HIV]: Secondary | ICD-10-CM

## 2020-04-03 DIAGNOSIS — F331 Major depressive disorder, recurrent, moderate: Secondary | ICD-10-CM | POA: Diagnosis not present

## 2020-04-03 DIAGNOSIS — E039 Hypothyroidism, unspecified: Secondary | ICD-10-CM

## 2020-04-03 DIAGNOSIS — R7301 Impaired fasting glucose: Secondary | ICD-10-CM | POA: Diagnosis not present

## 2020-04-03 DIAGNOSIS — Z1231 Encounter for screening mammogram for malignant neoplasm of breast: Secondary | ICD-10-CM

## 2020-04-03 DIAGNOSIS — Z1159 Encounter for screening for other viral diseases: Secondary | ICD-10-CM | POA: Diagnosis not present

## 2020-04-03 DIAGNOSIS — Z1211 Encounter for screening for malignant neoplasm of colon: Secondary | ICD-10-CM

## 2020-04-03 DIAGNOSIS — I10 Essential (primary) hypertension: Secondary | ICD-10-CM | POA: Diagnosis not present

## 2020-04-03 MED ORDER — CARVEDILOL 3.125 MG PO TABS: 3.1250 mg | ORAL_TABLET | Freq: Two times a day (BID) | ORAL | 0 refills | Status: DC

## 2020-04-03 NOTE — Assessment & Plan Note (Addendum)
Checking labs today, will adjust meds as needed.

## 2020-04-03 NOTE — Progress Notes (Signed)
   SUBJECTIVE:   CHIEF COMPLAINT / HPI: med refills  Past Medical History:  Diagnosis Date  . Arthritis   . Asthma   . COPD (chronic obstructive pulmonary disease) (HCC)   . Hypertension   . Neuropathy   . Pericarditis   . Personal history of urinary calculi 11/29/2014  . Thyroid disease    Hypertension: - Medications: lasix 40mg  daily w/ 20mg  prn, lisinopril 40mg  daily - Compliance: good. Hasn't had to take extra lasix.  - Checking BP at home: yes, 120-170s. - Denies any medication SEs - Diet: see below - Exercise: see below  Prediabetes, obesity - Last A1c 6.1 08/2019 - Medications: trulicity 0.75mg  weekly - Compliance: just started Friday, only had one dose.  - Checking BG at home: yes, CBG this am 131. 130-180s. - Diet: eats a lot of salad, trying to avoid sugary drinks, cutting back on sweets. - Exercise: leg exercises, PT, cleans - Denies symptoms of hypoglycemia, foot ulcers/trauma - endorses polydipsia, polyuria although not new for her  HLD - medications: simvastatin 40mg  daily - compliance: good - medication SEs: none  Hypothyroidism - Medications: Synthroid - Current symptoms:  none - Denies diarrhea, heat / cold intolerance, nervousness and palpitations - Symptoms have been well-controlled  Depression, PTSD - Medications: celexa 20mg  daily - Taking: as prescribed - Counseling: not currently, doing well. - Current stressors: upcoming holidays - Coping Mechanisms: talks to family  Screening for colon cancer - no blood in stool - no FH of colon cancer - wants cologuard  Sees Dr. 09/2019 soon. Getting echo after husband's surgery.   Lymphedema stable, sees vascular.   OBJECTIVE:   BP (!) 150/80   Pulse 73   Temp 98.2 F (36.8 C)   LMP 06/09/2017 (Approximate)   SpO2 97%   Gen: morbidly obese, in NAD Cardiac: distant heart sounds, RRR Lungs: distant lung sounds, no wheeze/rales Ext: WWP  ASSESSMENT/PLAN:   Essential  hypertension Above goal. Will add carvedilol for additional control. Obtaining labs today. F/u one month.  Adult hypothyroidism Checking labs today, will adjust meds as needed.  IFG (impaired fasting glucose) Tolerating trulicity. Recheck a1c today.  Clinical depression Doing well, no changes made today.   Health Maintenance Mammogram and Cologuard ordered today. Check Hep C and HIV screening. F/u for pap smear.   , DO

## 2020-04-03 NOTE — Assessment & Plan Note (Signed)
Tolerating trulicity. Recheck a1c today.

## 2020-04-03 NOTE — Telephone Encounter (Signed)
°  Chronic Care Management   Outreach Note  04/03/2020 Name: Cheryl Chandler MRN: [id number] DOB: 1966-04-02  Referred by: [name], PA-C Reason for referral : Care Coordination (RNCM Chronic Disease Management and Care Coordination Needs)   [name] is enrolled in a Managed Medicaid Health Plan: No  An unsuccessful telephone outreach was attempted today. The patient was referred to the case management team for assistance with care management and care coordination. The patient could not talk today. States that they have an appointment. Will reschedule.   Follow Up Plan: The care management team will reach out to the patient again over the next 30 to 60 days.   Alto Denver RN, MSN, CCM Community Care Coordinator [place]   [facility] Mobile: [phone]

## 2020-04-03 NOTE — Patient Instructions (Addendum)
It was great to see you!  Our plans for today:  - Make sure you schedule an appointment with Dr. Welton Flakes to see about getting rescheduled for your ECHO and sleep study. - congratulations on losing weight! Keep up the great work on dieting. See below for tips. - We are ordering your mammogram. - Make an appointment for your pap smear.  - follow up in one month.    We are checking some labs today, we will release these results to your MyChart.  Take care and seek immediate care sooner if you develop any concerns.   Dr. Linwood Dibbles  Here is an example of what a healthy plate looks like:    ? Make half your plate fruits and vegetables.     ? Focus on whole fruits.     ? Vary your veggies.  ? Make half your grains whole grains. -     ? Look for the word "whole" at the beginning of the ingredients list    ? Some whole-grain ingredients include whole oats, whole-wheat flour,        whole-grain corn, whole-grain brown rice, and whole rye.  ? Move to low-fat and fat-free milk or yogurt.  ? Vary your protein routine. - Meat, fish, poultry (chicken, [place]), eggs, beans (kidney, pinto), dairy.  ? Drink and eat less sodium, saturated fat, and added sugars.   Diet Recommendations for Diabetes   1. Eat at least 3 meals and 1-2 snacks per day. Never go more than 4-5 hours while awake without eating. Eat breakfast within the first hour of getting up.   2. Limit starchy foods to TWO per meal and ONE per snack. ONE portion of a starchy  food is equal to the following:   - ONE slice of bread (or its equivalent, such as half of a hamburger bun).   - 1/2 cup of a "scoopable" starchy food such as potatoes or rice.   - 15 grams of Total Carbohydrate as shown on food label.  3. Include at every meal: a protein food, a carb food, and vegetables and/or fruit.   - Obtain twice the volume of vegetables as protein or carbohydrate foods for both lunch and dinner.   - Fresh or frozen vegetables are best.   -  Keep frozen vegetables on hand for a quick vegetable serving.       Starchy (carb) foods: Bread, rice, pasta, potatoes, corn, cereal, grits, crackers, bagels, muffins, all baked goods.  (Fruits, milk, and yogurt also have carbohydrate, but most of these foods will not spike your blood sugar as most starchy foods will.)  A few fruits do cause high blood sugars; use small portions of bananas (limit to 1/2 at a time), grapes, watermelon, oranges, and most tropical fruits.    Protein foods: Meat, fish, poultry, eggs, dairy foods, and beans such as pinto and kidney beans (beans also provide carbohydrate).

## 2020-04-03 NOTE — Assessment & Plan Note (Signed)
Above goal. Will add carvedilol for additional control. Obtaining labs today. F/u one month.

## 2020-04-03 NOTE — Assessment & Plan Note (Signed)
Doing well, no changes made today. 

## 2020-04-04 ENCOUNTER — Encounter: Payer: Self-pay | Admitting: Family Medicine

## 2020-04-04 ENCOUNTER — Telehealth: Payer: Self-pay

## 2020-04-04 ENCOUNTER — Other Ambulatory Visit: Payer: Medicaid Other

## 2020-04-04 DIAGNOSIS — Z1211 Encounter for screening for malignant neoplasm of colon: Secondary | ICD-10-CM | POA: Diagnosis not present

## 2020-04-04 LAB — TSH: TSH: 1.74 u[IU]/mL (ref 0.450–4.500)

## 2020-04-04 LAB — BASIC METABOLIC PANEL
BUN/Creatinine Ratio: 21 (ref 9–23)
BUN: 18 mg/dL (ref 6–24)
CO2: 28 mmol/L (ref 20–29)
Calcium: 8.8 mg/dL (ref 8.7–10.2)
Chloride: 102 mmol/L (ref 96–106)
Creatinine, Ser: 0.84 mg/dL (ref 0.57–1.00)
GFR calc Af Amer: 92 mL/min/{1.73_m2} (ref >59)
GFR calc non Af Amer: 80 mL/min/{1.73_m2} (ref >59)
Glucose: 128 mg/dL — ABNORMAL HIGH (ref 65–99)
Potassium: 4.2 mmol/L (ref 3.5–5.2)
Sodium: 141 mmol/L (ref 134–144)

## 2020-04-04 LAB — BAYER DCA HB A1C WAIVED: HB A1C (BAYER DCA - WAIVED): 5.5 % (ref <7.0)

## 2020-04-04 LAB — HEPATITIS C ANTIBODY: Hep C Virus Ab: <0.1 s/co ratio (ref 0.0–0.9)

## 2020-04-04 LAB — HIV ANTIBODY (ROUTINE TESTING W REFLEX): HIV Screen 4th Generation wRfx: NONREACTIVE

## 2020-04-04 NOTE — Addendum Note (Signed)
Addended by: Pablo Ledger on: 04/04/2020 11:39 AM   Modules accepted: Orders

## 2020-04-04 NOTE — Telephone Encounter (Signed)
Called and provided patient with phone number to [facility] as she states she has been there before.

## 2020-04-04 NOTE — Telephone Encounter (Signed)
Copied from CRM [id number]. Topic: Quick Communication - See Telephone Encounter >> Apr 04, 2020 11:39 AM Aretta Nip wrote: CRM for notification. See Telephone encounter for: 04/04/20. Pt states [facility] had called to sch mammo/ (Jan) and she told them to not make appt as she has car problems, please reach out and sch with local that is in net work for [employer]

## 2020-04-06 LAB — FECAL OCCULT BLOOD, IMMUNOCHEMICAL: Fecal Occult Bld: NEGATIVE

## 2020-04-16 ENCOUNTER — Telehealth: Payer: Self-pay | Admitting: Family Medicine

## 2020-04-16 NOTE — Telephone Encounter (Signed)
Called to check the status of a fax that was sent on 12/17.  She indicated that she is refaxing it today.  Please advise and confirm fax was received.

## 2020-04-17 DIAGNOSIS — Z993 Dependence on wheelchair: Secondary | ICD-10-CM | POA: Diagnosis not present

## 2020-04-17 DIAGNOSIS — R3981 Functional urinary incontinence: Secondary | ICD-10-CM | POA: Diagnosis not present

## 2020-04-17 NOTE — Telephone Encounter (Signed)
Verbal orders given  

## 2020-04-17 NOTE — Telephone Encounter (Signed)
Stephanie with [employer] is requesting verbal orders for incontient supplies and can be reached at [phone]

## 2020-04-17 NOTE — Telephone Encounter (Signed)
Yes, ok for verbal orders.

## 2020-04-17 NOTE — Telephone Encounter (Signed)
Paperwork received and placed in providers folder for signature.  Called and let [name] know that paperwork was received.

## 2020-04-17 NOTE — Telephone Encounter (Signed)
Ok for verbal orders ?

## 2020-04-18 NOTE — Telephone Encounter (Signed)
Form sent back

## 2020-04-27 DIAGNOSIS — Z419 Encounter for procedure for purposes other than remedying health state, unspecified: Secondary | ICD-10-CM | POA: Diagnosis not present

## 2020-04-28 ENCOUNTER — Other Ambulatory Visit: Payer: Self-pay | Admitting: Nurse Practitioner

## 2020-04-28 ENCOUNTER — Other Ambulatory Visit: Payer: Self-pay | Admitting: Family Medicine

## 2020-04-28 DIAGNOSIS — R0602 Shortness of breath: Secondary | ICD-10-CM

## 2020-04-28 DIAGNOSIS — R059 Cough, unspecified: Secondary | ICD-10-CM

## 2020-04-28 DIAGNOSIS — E039 Hypothyroidism, unspecified: Secondary | ICD-10-CM

## 2020-04-28 DIAGNOSIS — J301 Allergic rhinitis due to pollen: Secondary | ICD-10-CM

## 2020-04-29 NOTE — Telephone Encounter (Signed)
Patient has appointment for follow up 05/02/19- needs PHQ-2 completed at visit

## 2020-05-01 ENCOUNTER — Ambulatory Visit: Payer: Self-pay | Admitting: Pharmacist

## 2020-05-01 ENCOUNTER — Ambulatory Visit: Payer: Medicaid Other | Admitting: Family Medicine

## 2020-05-01 ENCOUNTER — Telehealth: Payer: Self-pay

## 2020-05-01 NOTE — Telephone Encounter (Signed)
PA for Seroquel initiated and submitted via Cover My Meds. Key: [id number]

## 2020-05-02 NOTE — Telephone Encounter (Signed)
PA approved.

## 2020-05-02 NOTE — Chronic Care Management (AMB) (Signed)
Chronic Care Management   Follow Up Note   05/02/2020 Name: Cheryl Chandler MRN: [id number] DOB: 30-Sep-1965  Referred by: [name], PA-C Reason for referral : No chief complaint on file.   Cheryl Chandler is a 55 y.o. year old female who is a primary care patient of [name], [place]. The CCM team was consulted for assistance with chronic disease management and care coordination needs.    Review of patient status, including review of consultants reports, relevant laboratory and other test results, and collaboration with appropriate care team members and the patient's provider was performed as part of comprehensive patient evaluation and provision of chronic care management services.    SDOH (Social Determinants of Health) assessments performed: No See Care Plan activities for detailed interventions related to [facility])     Outpatient Encounter Medications as of 05/01/2020  Medication Sig Note  . albuterol (PROVENTIL) (2.5 MG/3ML) 0.083% nebulizer solution INHALE CONTENTS OF 1 VIAL IN NEBULIZER EVERY 4 HOURS AS NEEDED   . carvedilol (COREG) 3.125 MG tablet Take 1 tablet (3.125 mg total) by mouth 2 (two) times daily with a meal.   . citalopram (CELEXA) 20 MG tablet TAKE 1 TABLET BY MOUTH ONCE DAILY   . EPINEPHrine 0.3 mg/0.3 mL IJ SOAJ injection USE AS DIRECTED. 09/06/2019: Has if needed  . furosemide (LASIX) 20 MG tablet TAKE 1 TABLET BY MOUTH ONCE DAILY IN AFTERNOON AS NEEDED LEG EDEMA   . furosemide (LASIX) 40 MG tablet Take 1 tablet (40 mg total) by mouth daily.   [name] gabapentin (NEURONTIN) 300 MG capsule TAKE 1 CAPSULE BY MOUTH 3 TIMES DAILY ASNEEDED   . levothyroxine (SYNTHROID) 50 MCG tablet TAKE 1 TABLET BY MOUTH ONCE DAILY ON AN EMPTY STOMACH. WAIT 30 MINUTES BEFORE TAKING OTHER MEDS.   [name] lisinopril (ZESTRIL) 40 MG tablet TAKE 1 TABLET BY MOUTH ONCE DAILY   . meloxicam (MOBIC) 15 MG tablet TAKE 1 TABLET BY MOUTH ONCE DAILY AS NEEDED WITH FOOD OR MILK    . methocarbamol (ROBAXIN) 750 MG tablet TAKE 1-2 TABLETS BY MOUTH 3 TIMES DAILY AS NEEDED   . mometasone-formoterol (DULERA) 100-5 MCG/ACT AERO INHALE 2 PUFFS BY MOUTH TWICE A DAY. RINSE MOUTH AFTER USE.   . montelukast (SINGULAIR) 10 MG tablet TAKE 1 TABLET BY MOUTH AT BEDTIME   . nitroGLYCERIN (NITROSTAT) 0.4 MG SL tablet DISSOLVE 1 TABLET UNDER TONGUE AT ONSET OF CHEST PAIN. REPEAT IN 5 MIN IF NOT RESOLVED, MAX 3 DOSES. 911 IF NEEDED.   [name] omeprazole (PRILOSEC) 20 MG capsule Take 1 capsule (20 mg total) by mouth daily.   . QUEtiapine (SEROQUEL) 25 MG tablet Take 1 tablet (25 mg total) by mouth at bedtime.   . simvastatin (ZOCOR) 40 MG tablet TAKE 1 TABLET BY MOUTH AT BEDTIME   . SPIRIVA HANDIHALER 18 MCG inhalation capsule INHALE CONTENTS OF 1 CAPSULE ONCE DAILY   . traMADol (ULTRAM) 50 MG tablet Take 1 tablet (50 mg total) by mouth daily as needed. (Patient not taking: Reported on 04/03/2020)   . triamcinolone cream (KENALOG) 0.1 % APPLY 1 APPLICATION TOPICALLY TWICE DAILY   . TRULICITY 0.75 MG/0.5ML SOPN INJECT 0.75MG  SUBQ ONCE A WEEK   . Vitamin D, Ergocalciferol, (DRISDOL) 1.25 MG (50000 UNIT) CAPS capsule TAKE 1 CAPSULE BY MOUTH ONCE EVERY 7 DAYS    No facility-administered encounter medications on file as of 05/01/2020.     Objective:   Goals Addressed  This Visit's Progress   .  PharmD "I have depression, anxiety, and chronic pain" (pt-stated)        CARE PLAN ENTRY (see longtitudinal plan of care for additional care plan information)  Current Barriers:  . Polypharmacy; complex patient with multiple comorbidities including HTN, venous insufficiency, COPD, peripheral neuropathy, depression/anxiety, arthritis.  o Reports that  is tolerating Trulicity well. Insurance now requiring PA for Ozempic.  o Sister was planning to help her with disability application. She reports her sister has been "busy" and they have had some issues with items being taken from their mailbox  and sister wants to complete online.  . Self-manages medications by using a pill box o Pre-diabetes/weight management: A1c 3.5%, Now on Trulicity 3.29 mg weekly. - Eating salads, cooking meals with air fryer or baking - Drinking water w/ flavoring packets - Eating more [place], lean meats  - Has been doing her therapy exercises to help with her legs. - Reports arranging for a nurse to come into the home after she sends in paperwork o HTN/ASCVD risk reduction: lisinopril 40 mg daily; carvedilol 3.1235 mg bid (has been taking daily) Will begin bid tonight. BP still elevated. simvastatin 40 mg daily; follows w/ Dr. Laurelyn Sickle; Missed appt for echo o Venous insufficiency: furosemide 40 mg daily; follows w/ Vascular; missed last 2 appointments with vascular; using lymphedema pumps. Notes that compression stockings that she previously used irritated her skin o COPD/Asthma: montelukast 10 mg daily; Spiriva 18 mcg daily; Dulera 200/50 mcg BID, albuterol nebulizer PRN o Peripheral neuropathy/pain: gabapentin 300 mg, meloxicam 15 mg, methocarbamol 750 mg daily o Depression/anxiety: citalopram 20 mg daily  Doing well currently o Hypothyroid: levothyroxine 50 mcg o GERD: omeprazole 20 mg daily  Pharmacist Clinical Goal(s):  [name] Over the next 90 days, patient will work with PharmD and provider towards optimized medication management  Interventions: . Comprehensive medication review performed, medication list updated in electronic medical record . Reviewed lab values from last appointment. Praised patient for A1C . Encouraged focus on protein intake. Reports they have been doing better with diet and using air fryer. . Encouraged to reschedule missed appoinments  . Will collaborate w/ CCM team for continued support  Patient Self Care Activities:  . Patient will take medications as prescribed . Patient will schedule f/u with specialty providers and attend these appointments  Please see past updates related  to this goal by clicking on the "Past Updates" button in the selected goal         Diabetes   A1c goal <6.5%  Recent Relevant Labs: Lab Results  Component Value Date/Time   HGBA1C 5.5 04/03/2020 04:32 PM   HGBA1C 6.1 (H) 09/06/2019 02:24 PM   HGBA1C 5.7 (H) 05/03/2019 01:35 PM    Last diabetic Eye exam: No results found for: HMDIABEYEEXA  Last diabetic Foot exam: No results found for: HMDIABFOOTEX   Checking BG: Occassionally   Patient has failed these meds in past: NA Patient is currently controlled on the following medications: . Trulicity 9.24 mg q week  We discussed: Diet and exercise extensively. Laini reports she and Will are eating healthier and continue sugar free flavored water instead of soda. Ozempic changed to Trulicity in December due to insurance.  She is tolerating Trulicity well and does not report and further "shaking" episodes  Plan  Continue current medications.     Hypertension   BP goal is:  <130/80  Office blood pressures are  BP Readings from Last 3 Encounters:  04/03/20 (!) 150/80  12/06/19 (!) 162/78  09/06/19 130/66   Patient checks BP at home twice daily Patient home BP readings are ranging: 160-195/60-95  Patient has failed these meds in the past:NA Patient is currently query  controlled on the following medications:  . Lisinopril 40 mg qd . Furosemide 20 mg daily   . Carvedilol 3.125 mg bid (has been taking daily)  We discussed Shalissa reports she can not tolerate compression hose but uses her lymph edema pumps and elevates her legs. She is no longer adding salt to food or drinking soda. She keeps a BP log and we reviewed reading for past week. BP appears to be trending down but is still elevated. She has been taking carvedilol once daily. I counseled regarding twice daily directions and instructed to take second dose starting tonight. Reviewed proper BP technique incldung sitting down for at least 5 minutes before, resting calmly, with  your feet flat on the floor. She is completing paper work for an in [employer] and has been doing her PT exercised regularly.  Plan  Continue current medications and control with diet and exercise       Plan:   Telephone follow up appointment with care management team member scheduled for:1- 2 months   Mercer Pod. Tiburcio Pea PharmD, BCPS Clinical Pharmacist [facility] [phone]

## 2020-05-17 DIAGNOSIS — Z993 Dependence on wheelchair: Secondary | ICD-10-CM | POA: Diagnosis not present

## 2020-05-17 DIAGNOSIS — R3981 Functional urinary incontinence: Secondary | ICD-10-CM | POA: Diagnosis not present

## 2020-05-22 ENCOUNTER — Telehealth: Payer: Self-pay | Admitting: General Practice

## 2020-05-22 ENCOUNTER — Ambulatory Visit: Payer: Self-pay | Admitting: General Practice

## 2020-05-22 DIAGNOSIS — J449 Chronic obstructive pulmonary disease, unspecified: Secondary | ICD-10-CM

## 2020-05-22 DIAGNOSIS — F17210 Nicotine dependence, cigarettes, uncomplicated: Secondary | ICD-10-CM

## 2020-05-22 DIAGNOSIS — I10 Essential (primary) hypertension: Secondary | ICD-10-CM

## 2020-05-22 DIAGNOSIS — N3281 Overactive bladder: Secondary | ICD-10-CM

## 2020-05-22 NOTE — Patient Instructions (Signed)
Visit Information  Goals Addressed              This Visit's Progress   .  COMPLETED: RN- I need more help in the home (pt-stated)        Current Barriers: Closing this goal and opening in new ELS . Knowledge Deficits related to managing multiple chronic diseases  . Lacks caregiver support.  . Corporate treasurer.  . Transportation barriers- patient depends on others for transporation  Nurse Case Manager Clinical Goal(s):  [name] Over the next 120 days, patient will work with [facility]  to address needs related to Multiple chronic Disease Processes . Over the next 120 days, patient will work with [facility]  (community agency) to Increase Strength and regain some independence   Interventions:  . Discussed plans with patient for ongoing care management follow up and provided patient with direct contact information for care management team  . Evaluated current home/life routine and her husband is home and doing well, but still can not drive. No transportation barriers as her husbands Dad comes and takes them to appointments.  . Evaluated help in the home and the patient has an aide that comes in and helps with her needs.  The patient has an aide that comes in every Thursday.  She is doing well with this. 01-24-2020 The patient still has help in the home.  The patient is currently having a lot of stressors in her life due to family issues going on. Her dad called last week and let her know that he had gotten remarried and had a new family now. This really upset her even though she knew it was coming.  . Evaluation of SDOH.  Has holes in the floor and needs help with getting the floor fixed. Has supplies. Has been calling area churches but has not had any luck with finding someone to do the work. Will see if  resources are available to help with expressed needs. 01-24-2020: the patient states the holes are still in the floor. The patient states that their phones had been messed up and then they  had to get different ones. The patient states that she lost the number to the friend that was helping them with this. Willies dad was going to help with fixing the floor. She does not know what the status of the floor being fixed is.    Patient Self Care Activities:   . Currently UNABLE TO independently perform ADLs or IADLs   Please see past updates related to this goal by clicking on the "Past Updates" button in the selected goal      .  COMPLETED: RN: My blood pressure is sometimes high        Current Barriers: Closing this goal and opening in new ELS . Chronic Disease Management support and education needs related to management of HTN, IFG,  and other chronic disease processes  Nurse Case Manager Clinical Goal(s):  [name] Over the next 120 days, patient will work with [facility] and pcp to address needs related to increased stress impacting her health and well being related to managing her blood pressure . Over the next 120 days, patient will attend all scheduled medical appointments: Cardiology appointment on 2/8 and pcp on 2/10 . Over the next 120 days, patient will verbalize basic understanding of HTN and heart disease process and self health management plan as evidenced by managing blood pressure more effectively  Interventions:  . Evaluation of current treatment plan related to  HTN and IFG, and patient's adherence to plan as established by provider. 01-24-2020: The patient states that she had to cancel her appointment with the cardiologist due to not feeling well after the covid vaccine. The patient will rescheduled. She goes next month to see the "leg doctor". She is still having a lot of swelling in her legs.  She states the Lasix has helped a lot but she is still noticing swelling and sometimes they are painful.  . Provided education to patient re: exercise and heart healthy diet.  The patient endorses watching her diet but since going on Ozempic she has not had an appetite.  Education on eating  heart healthy/Ada diet. The patient is tolerating the 0.25mg  because the 0.5mg  makes her stomach upset. 01-24-2020: Education on writing down questions for the provider and keeping appointments.  . Reviewed medications with patient and discussed compliance (also working with CCM pharmacist) . Evaluation of blood pressure readings. The patient has blood pressure readings up and down. The patient does get headaches at times.  The patient admits she does not always watch the sodium in the foods.  Education and support given.  01-24-2020: the patient states her blood pressures are still up and down. Readings given: 185/80-85, 190/80-85 and 162/78.  Education on managing blood pressure to prevent risk of heart attack and stroke.  . Evaluation of swelling. The patient has not been able to wear compression stocking. The patient says it causes her to have blisters on her legs.  The patient is elevating her legs and using her lymphedema.  . Discussed plans with patient for ongoing care management follow up and provided patient with direct contact information for care management team . Advised patient, providing education and rationale, to monitor blood pressure daily and record, calling pcp for findings outside established parameters.  . Reviewed scheduled/upcoming provider appointments including: pcp appointment on 03-06-2020 . Social Work referral for stressors in her life, loss of her mom and her dad's current lifestyle- the patients dad is getting remarried and this has really been hard on her,  and other factors impacting her health- working with LCSW . Pharmacy referral for ongoing support for medication management   Patient Self Care Activities:  . Patient verbalizes understanding of plan to manage HTN more effectively by institution of life style changes: diet, exercise, reducing stressors . Self administers medications as prescribed . Attends all scheduled provider appointments . Calls provider office  for new concerns or questions . Unable to independently manage HTN and other chronic health conditions  Please see past updates related to this goal by clicking on the "Past Updates" button in the selected goal      .  RNCM: Keep Skin Clean and Dry        Follow Up Date 07-10-2020   - clean and dry skin well - keep skin dry - use a fragrance-free lotion on skin - wear a protective pad or garment    Why is this important?    Leaking urine (pee) can cause soreness from skin rashes and redness.   This is caused by skin being exposed to urine (pee).        [name]  RNCM: Track and Manage My Symptoms-COPD        Timeframe:  Long-Range Goal Priority:  Medium Start Date:                             Expected End Date:  Follow Up Date 07-10-2020   - develop a rescue plan - eliminate symptom triggers at home - follow rescue plan if symptoms flare-up - keep follow-up appointments - use an extra pillow to sleep    Why is this important?    Tracking your symptoms and other information about your health helps your doctor plan your care.   Write down the symptoms, the time of day, what you were doing and what medicine you are taking.   You will soon learn how to manage your symptoms.         [name]  RNCM: Track and Manage My Triggers-COPD        Timeframe:  Long-Range Goal Priority:  Medium Start Date:                             Expected End Date:                       Follow Up Date 07-10-2020   - avoid second hand smoke - eliminate smoking in my home - identify and avoid work-related triggers - identify and remove indoor air pollutants - limit outdoor activity during cold weather - listen for public air quality announcements every day    Why is this important?    Triggers are activities or things, like tobacco smoke or cold weather, that make your COPD (chronic obstructive pulmonary disease) flare-up.   Knowing these triggers helps you plan how to stay away  from them.   When you cannot remove them, you can learn how to manage them.            The patient verbalized understanding of instructions, educational materials, and care plan provided today and declined offer to receive copy of patient instructions, educational materials, and care plan.   Telephone follow up appointment with care management team member scheduled for: 07-10-2020 at 1:45 pm  Alto Denver RN, MSN, CCM Community Care Coordinator [place]  [facility] Mobile: [phone]

## 2020-05-22 NOTE — Chronic Care Management (AMB) (Signed)
Care Management    RN Visit Note  05/22/2020 Name: Cheryl Chandler MRN: [id number] DOB: 1965/06/19  Subjective: Cheryl Chandler is a 55 y.o. year old female who is a primary care patient of [name], NP. The care management team was consulted for assistance with disease management and care coordination needs.    Engaged with patient by telephone for follow up visit in response to provider referral for case management and/or care coordination services.   Consent to Services:   Ms. Glassner was given information about Care Management services today including:  1. Care Management services includes personalized support from designated clinical staff supervised by her physician, including individualized plan of care and coordination with other care providers 2. 24/7 contact phone numbers for assistance for urgent and routine care needs. 3. The patient may stop case management services at any time by phone call to the office staff.  Patient agreed to services and consent obtained.   Assessment: Review of patient past medical history, allergies, medications, health status, including review of consultants reports, laboratory and other test data, was performed as part of comprehensive evaluation and provision of chronic care management services.   SDOH (Social Determinants of Health) assessments and interventions performed:    Care Plan  Allergies  Allergen Reactions   Codeine Shortness Of Breath and Rash   Percocet [Oxycodone-Acetaminophen] Shortness Of Breath   Sulfa Antibiotics Shortness Of Breath and Rash   Aspirin Hives   Bee Venom     Bee stings   Darvon [Propoxyphene]     Darvocet-N 100   Latex    Oxycodone Nausea And Vomiting   Penicillins     Has patient had a PCN reaction causing immediate rash, facial/tongue/throat swelling, SOB or lightheadedness with hypotension: Unknown Has patient had a PCN reaction causing severe rash involving mucus  membranes or skin necrosis: Unknown Has patient had a PCN reaction that required hospitalization: Unknown Has patient had a PCN reaction occurring within the last 10 years: Unknown If all of the above answers are "NO", then may proceed with Cephalosporin use.    Outpatient Encounter Medications as of 05/22/2020  Medication Sig Note   albuterol (PROVENTIL) (2.5 MG/3ML) 0.083% nebulizer solution INHALE CONTENTS OF 1 VIAL IN NEBULIZER EVERY 4 HOURS AS NEEDED    carvedilol (COREG) 3.125 MG tablet Take 1 tablet (3.125 mg total) by mouth 2 (two) times daily with a meal.    citalopram (CELEXA) 20 MG tablet TAKE 1 TABLET BY MOUTH ONCE DAILY    EPINEPHrine 0.3 mg/0.3 mL IJ SOAJ injection USE AS DIRECTED. 09/06/2019: Has if needed   furosemide (LASIX) 20 MG tablet TAKE 1 TABLET BY MOUTH ONCE DAILY IN AFTERNOON AS NEEDED LEG EDEMA    furosemide (LASIX) 40 MG tablet Take 1 tablet (40 mg total) by mouth daily.    gabapentin (NEURONTIN) 300 MG capsule TAKE 1 CAPSULE BY MOUTH 3 TIMES DAILY ASNEEDED    levothyroxine (SYNTHROID) 50 MCG tablet TAKE 1 TABLET BY MOUTH ONCE DAILY ON AN EMPTY STOMACH. WAIT 30 MINUTES BEFORE TAKING OTHER MEDS.    lisinopril (ZESTRIL) 40 MG tablet TAKE 1 TABLET BY MOUTH ONCE DAILY    meloxicam (MOBIC) 15 MG tablet TAKE 1 TABLET BY MOUTH ONCE DAILY AS NEEDED WITH FOOD OR MILK    methocarbamol (ROBAXIN) 750 MG tablet TAKE 1-2 TABLETS BY MOUTH 3 TIMES DAILY AS NEEDED    mometasone-formoterol (DULERA) 100-5 MCG/ACT AERO INHALE 2 PUFFS BY MOUTH TWICE A DAY. RINSE MOUTH AFTER  USE.    montelukast (SINGULAIR) 10 MG tablet TAKE 1 TABLET BY MOUTH AT BEDTIME    nitroGLYCERIN (NITROSTAT) 0.4 MG SL tablet DISSOLVE 1 TABLET UNDER TONGUE AT ONSET OF CHEST PAIN. REPEAT IN 5 MIN IF NOT RESOLVED, MAX 3 DOSES. 911 IF NEEDED.    omeprazole (PRILOSEC) 20 MG capsule Take 1 capsule (20 mg total) by mouth daily.    QUEtiapine (SEROQUEL) 25 MG tablet Take 1 tablet (25 mg total) by mouth at  bedtime.    simvastatin (ZOCOR) 40 MG tablet TAKE 1 TABLET BY MOUTH AT BEDTIME    SPIRIVA HANDIHALER 18 MCG inhalation capsule INHALE CONTENTS OF 1 CAPSULE ONCE DAILY    traMADol (ULTRAM) 50 MG tablet Take 1 tablet (50 mg total) by mouth daily as needed. (Patient not taking: Reported on 04/03/2020)    triamcinolone cream (KENALOG) 0.1 % APPLY 1 APPLICATION TOPICALLY TWICE DAILY    TRULICITY 9.92 EQ/6.8TM SOPN INJECT 0.75MG SUBQ ONCE A WEEK    Vitamin D, Ergocalciferol, (DRISDOL) 1.25 MG (50000 UNIT) CAPS capsule TAKE 1 CAPSULE BY MOUTH ONCE EVERY 7 DAYS    No facility-administered encounter medications on file as of 05/22/2020.    Patient Active Problem List   Diagnosis Date Noted   Fibrocystic disease of both breasts 05/07/2019   IFG (impaired fasting glucose) 05/03/2019   Chronic venous insufficiency 11/09/2018   Overactive bladder 09/30/2018   Peripheral neuropathy 09/30/2018   Apnea 01/02/2016   Lymphedema 11/30/2014   Dyspnea on exertion 11/30/2014   Anxiety 11/29/2014   Arthritis 11/29/2014   COPD (chronic obstructive pulmonary disease) ([place]) 11/29/2014   Clinical depression 11/29/2014   H/O eating disorder 11/29/2014   Esophageal reflux 11/29/2014   Acne inversa 11/29/2014   Essential hypertension 11/29/2014   HLD (hyperlipidemia) 11/29/2014   Adult hypothyroidism 11/29/2014   Insomnia 11/29/2014   Low back pain 11/29/2014   Morbid obesity ([place]) 11/29/2014   Posttraumatic stress disorder 11/29/2014   Vitamin D deficiency 11/29/2014   Nicotine dependence, cigarettes, uncomplicated 19/62/2297    Conditions to be addressed/monitored: HTN, COPD and OAB/Urinary Incontience   Care Plan : RNCM: COPD (Adult)  Updates made by Vanita Ingles since 05/22/2020 12:00 AM    Problem: RNCM: Psychological Adjustment to Diagnosis (COPD)   Priority: Medium    Goal: RNCM: Adjustment to Disease Achieved   Priority: Medium  Note:   Current Barriers:    Knowledge deficits related to basic understanding of COPD disease process  Knowledge deficits related to basic COPD self care/management  Knowledge deficit related to basic understanding of how to use inhalers and how inhaled medications work  Knowledge deficit related to importance of energy conservation  Limited Social Support  Unable to independently manage COPD  Lacks social connections  Does not contact provider office for questions/concerns  Case Manager Clinical Goal(s):  Over the next 120 days patient will report using inhalers as prescribed including rinsing mouth after use  Over the next 120 days, patient will be able to verbalize understanding of COPD action plan and when to seek appropriate levels of medical care  Over the next 120 days, patient will engage in lite exercise as tolerated to build/regain stamina and strength and reduce shortness of breath through activity tolerance  Over the next 120 days, patient will verbalize basic understanding of COPD disease process and self care activities  Interventions:   Collaboration with [name], NP regarding development and update of comprehensive plan of care as evidenced by provider attestation and co-signature  Inter-disciplinary care team collaboration (see longitudinal plan of care)  UNABLE to independently: manage COPD  Provided patient with basic written and verbal COPD education on self care/management/and exacerbation prevention   Provided patient with COPD action plan and reinforced importance of daily self assessment  Provided written and verbal instructions on pursed lip breathing and utilized returned demonstration as teach back  Provided instruction about proper use of medications used for management of COPD including inhalers  Advised patient to self assesses COPD action plan zone and make appointment with provider if in the yellow zone for 48 hours without improvement.  Provided patient  with education about the role of exercise in the management of COPD  Advised patient to engage in light exercise as tolerated 3-5 days a week  Provided education about and advised patient to utilize infection prevention strategies to reduce risk of respiratory infection  Patient Goals/Self-Care Activities:   - decision-making supported  - depression screen reviewed  - emotional support provided  - family involvement promoted  - problem-solving facilitated  - relaxation techniques promoted  - verbalization of feelings encouraged Follow Up Plan: Telephone follow up appointment with care management team member scheduled for: 07-10-2020 at 1:45 pm   Task: RNCM: Support Psychosocial Response to Chronic Obstructive Pulmonary Disease   Note:   Care Management Activities:    - decision-making supported - depression screen reviewed - emotional support provided - family involvement promoted - problem-solving facilitated - relaxation techniques promoted - verbalization of feelings encouraged     Problem: RNCM: Symptom Exacerbation (COPD)   Priority: High    Goal: RNCM: Smoking Cessation Symptom Exacerbation Prevented or Minimized   Priority: High  Note:   Current Barriers:   Unable to independently stop smoking   Does not adhere to provider recommendations re: smoking cessation   Lacks social connections  Does not contact provider office for questions/concerns  Tobacco abuse of >40 years; currently smoking 1 ppd per week  Previous quit attempts, unsuccessful several successful using patches- has a pattern of stopping and then starting back   Reports smoking within 30 minutes of waking up  Reports triggers to smoke include: stress, chronic conditions   Reports motivation to quit smoking includes: knows it will help her breathing and other chronic conditions   On a scale of 1-10, reports MOTIVATION to quit is 8  On a scale of 1-10, reports CONFIDENCE in quitting is  7 Clinical Goal(s):   Over the next 120 days, patient will work with RN Case Engineer, civil (consulting) and provider towards tobacco cessation  Interventions:  Collaboration with [name], NP regarding development and update of comprehensive plan of care as evidenced by provider attestation and co-signature  Inter-disciplinary care team collaboration (see longitudinal plan of care)  Evaluation of current treatment plan reviewed  Provided contact information for [place] [facility]uit Line (1-800-QUIT-NOW). Patient will outreach this group for support.  Discussed plans with patient for ongoing care management follow up and provided patient with direct contact information for care management team  Provided patient with printed smoking cessation educational materials  Provided contact information for  Quit Line (1-800-QUIT-NOW). Patient will outreach this group for support.  Evaluation of current treatment plan reviewed- currently the patient is cutting back but wants to quit  Patient Goals/Self-Care Activities  Over the next 120 days, patient will:  - mindfulness, talking to family and friends as a habit during cravings  - verbally commit to reducing tobacco consumption - barriers to lifestyle changes  reviewed and addressed - barriers to treatment reviewed and addressed - breathing techniques encouraged - modification of home and work environment promoted - rescue (action) plan reviewed - signs/symptoms of infection reviewed - signs/symptoms of worsening disease assessed - treatment plan reviewed Follow Up Plan: Telephone follow up appointment with care management team member scheduled for: 07-10-2020 and 1:45 pm   Task: RNCM: Identify and Minimize Risk of COPD Exacerbation   Note:   Care Management Activities:    - barriers to lifestyle changes reviewed and addressed - barriers to treatment reviewed and addressed - breathing techniques encouraged - modification of  home and work environment promoted - rescue (action) plan reviewed - signs/symptoms of infection reviewed - signs/symptoms of worsening disease assessed - treatment plan reviewed       Care Plan : RNCM: Urinary Incontinence (Adult)  Updates made by Vanita Ingles since 05/22/2020 12:00 AM    Problem: RNCM: Disorder Identification (Urinary Incontinence)   Priority: Medium    Goal: RNCM: Urinary Incontinence Identified   Priority: Medium  Note:   Current Barriers:   Care Coordination needs related to urinary supplies and needs  in a patient with Urinary incontinence and OAB  Chronic Disease Management support and education needs related to effective management of urinary incontinence and OAB  Lacks caregiver support.   Unable to independently manage OAB and Urinary incontinence   Lacks social connections  Does not contact provider office for questions/concerns  Nurse Case Manager Clinical Goal(s):   Over the next 120 days, patient will verbalize understanding of plan for effective management of urinary issues  Over the next 120 days, patient will work with [facility] and pcp  to address needs related to urinary incontinence and OAB  Over the next 120 days, patient will demonstrate improved adherence to prescribed treatment plan for OAB and urinary incontinence as evidenced bydecreased episodes of incontinence, no UTI's, and following prescribed plan of care   Interventions:   1:1 collaboration with [name], NP regarding development and update of comprehensive plan of care as evidenced by provider attestation and co-signature  Inter-disciplinary care team collaboration (see longitudinal plan of care)  Evaluation of current treatment plan related to OAB, urinary incontinence  and patient's adherence to plan as established by provider.  Advised patient to call for worsening sx and sx of condition or questions   Provided education to patient re: incontinence products,  hygiene, and safety  Discussed plans with patient for ongoing care management follow up and provided patient with direct contact information for care management team  Patient Goals/Self-Care Activities Over the next 120 days, patient will:  - Patient will self administer medications as prescribed Patient will attend all scheduled provider appointments Patient will call pharmacy for medication refills Patient will continue to perform IADL's independently Patient will call provider office for new concerns or questions Patient will work with BSW to address care coordination needs and will continue to work with the clinical team to address health care and disease management related needs.   - nature and severity of incontinence assessed - signs of urinary tract infection assessed  Follow Up Plan: Telephone follow up appointment with care management team member scheduled for: 07-10-2020 at 1:45 pm       Task: RNCM: Identify Presence and Type of Urinary Incontinence   Note:   Care Management Activities:    - nature and severity of incontinence assessed - signs of urinary tract infection assessed      Care Plan :  RNCM: Hypertension (Adult)  Updates made by Vanita Ingles since 05/22/2020 12:00 AM    Problem: RNCM: Hypertension (Hypertension)   Priority: Medium    Goal: RNCM: Hypertension Monitored   Priority: Medium  Note:   Objective:   Last practice recorded BP readings:  BP Readings from Last 3 Encounters:  04/03/20 (!) 150/80  12/06/19 (!) 162/78  09/06/19 130/66     Most recent eGFR/CrCl: No results found for: EGFR  No components found for: CRCL Current Barriers:   Knowledge Deficits related to basic understanding of hypertension pathophysiology and self care management  Knowledge Deficits related to understanding of medications prescribed for management of hypertension  Limited Social Support  Unable to independently manage HTN  Lacks social connections  Does  not contact provider office for questions/concerns Case Manager Clinical Goal(s):   Over the next 120 days, patient will verbalize understanding of plan for hypertension management  Over the next 120 days, patient will demonstrate improved adherence to prescribed treatment plan for hypertension as evidenced by taking all medications as prescribed, monitoring and recording blood pressure as directed, adhering to low sodium/DASH diet  Over the next 120 days, patient will demonstrate improved health management independence as evidenced by checking blood pressure as directed and notifying PCP if SBP>160 or DBP > 90, taking all medications as prescribe, and adhering to a low sodium diet as discussed. Interventions:   Collaboration with [name], NP regarding development and update of comprehensive plan of care as evidenced by provider attestation and co-signature  Inter-disciplinary care team collaboration (see longitudinal plan of care)  Evaluation of current treatment plan related to hypertension self management and patient's adherence to plan as established by provider.  Provided education to patient re: stroke prevention, s/s of heart attack and stroke, DASH diet, complications of uncontrolled blood pressure  Reviewed medications with patient and discussed importance of compliance  Discussed plans with patient for ongoing care management follow up and provided patient with direct contact information for care management team  Advised patient, providing education and rationale, to monitor blood pressure daily and record, calling PCP for findings outside established parameters.  Patient Goals/Self-Care Activities  Over the next 120 days, patient will:  - Self administers medications as prescribed Attends all scheduled provider appointments Calls provider office for new concerns, questions, or BP outside discussed parameters Checks BP and records as discussed Follows a low sodium  diet/DASH diet - blood pressure trends reviewed - depression screen reviewed - home or ambulatory blood pressure monitoring encouraged Follow Up Plan: Telephone follow up appointment with care management team member scheduled for: 07-10-2020 at 1:45 pm   Task: RNCM: Identify and Monitor Blood Pressure Elevation   Note:   Care Management Activities:    - blood pressure trends reviewed - depression screen reviewed - home or ambulatory blood pressure monitoring encouraged         Plan: Telephone follow up appointment with care management team member scheduled for:  07-10-2020 at 1:45 pm  Noreene Larsson RN, MSN, [facility] Mobile: [phone]

## 2020-05-28 DIAGNOSIS — Z419 Encounter for procedure for purposes other than remedying health state, unspecified: Secondary | ICD-10-CM | POA: Diagnosis not present

## 2020-06-03 ENCOUNTER — Ambulatory Visit (INDEPENDENT_AMBULATORY_CARE_PROVIDER_SITE_OTHER): Payer: Medicaid Other | Admitting: Vascular Surgery

## 2020-06-04 ENCOUNTER — Other Ambulatory Visit: Payer: Self-pay | Admitting: Family Medicine

## 2020-06-04 ENCOUNTER — Other Ambulatory Visit: Payer: Self-pay | Admitting: Nurse Practitioner

## 2020-06-04 ENCOUNTER — Telehealth: Payer: Self-pay

## 2020-06-04 DIAGNOSIS — R059 Cough, unspecified: Secondary | ICD-10-CM

## 2020-06-04 DIAGNOSIS — E039 Hypothyroidism, unspecified: Secondary | ICD-10-CM

## 2020-06-04 DIAGNOSIS — J301 Allergic rhinitis due to pollen: Secondary | ICD-10-CM

## 2020-06-04 DIAGNOSIS — R0602 Shortness of breath: Secondary | ICD-10-CM

## 2020-06-04 NOTE — Telephone Encounter (Signed)
Copied from CRM [id number]. Topic: General - Other >> Jun 04, 2020 10:43 AM Gaetana Michaelis A wrote: Marchelle Folks from [facility] called to confirm that a fax was received in regards to patient's TRULICITY 0.75 MG/0.5ML SOPN  prescription Pharmacy is able to send fax again if needed.

## 2020-06-04 NOTE — Telephone Encounter (Signed)
Requested medication (s) are due for refill today: yes  Requested medication (s) are on the active medication list: yes  Last refill:  03/27/2020  Future visit scheduled:yes  Notes to clinic:  this refill cannot be delegated    Requested Prescriptions  Pending Prescriptions Disp Refills   QUEtiapine (SEROQUEL) 25 MG tablet [Pharmacy Med Name: QUETIAPINE FUMARATE 25 MG TAB] 90 tablet 1    Sig: TAKE 1 TABLET BY MOUTH AT BEDTIME      Not Delegated - Psychiatry:  Antipsychotics - Second Generation (Atypical) - quetiapine Failed - 06/04/2020  9:44 AM      Failed - This refill cannot be delegated      Failed - ALT in normal range and within 180 days    ALT  Date Value Ref Range Status  09/06/2019 22 0 - 32 IU/L Final   SGPT (ALT)  Date Value Ref Range Status  08/05/2014 31 U/L Final    Comment:    14-54 NOTE: New Reference Range  07/03/14           Failed - AST in normal range and within 180 days    AST  Date Value Ref Range Status  09/06/2019 12 0 - 40 IU/L Final   SGOT(AST)  Date Value Ref Range Status  08/05/2014 30 U/L Final    Comment:    15-41 NOTE: New Reference Range  07/03/14           Failed - Completed PHQ-2 or PHQ-9 in the last 360 days      Failed - Last BP in normal range    BP Readings from Last 1 Encounters:  04/03/20 (!) 150/80          Passed - Valid encounter within last 6 months    Recent Outpatient Visits           2 months ago Adult hypothyroidism   [facility] Caro Laroche, DO   6 months ago Lymphedema   [facility] [name], [place]   6 months ago Primary insomnia   [facility] [name], [place]   9 months ago IFG (impaired fasting glucose)   [facility], [place], [place]   11 months ago Upper respiratory tract infection, unspecified type   [facility], [place], [place]

## 2020-06-04 NOTE — Telephone Encounter (Signed)
PA submitted for  Quetiapine 25mg   Key:  Trulicity 0.75mg /0.66ml Key: [id number]  Will wait for approval or denial.

## 2020-06-05 ENCOUNTER — Other Ambulatory Visit: Payer: Self-pay | Admitting: Family Medicine

## 2020-06-05 ENCOUNTER — Ambulatory Visit: Payer: Self-pay | Admitting: Nurse Practitioner

## 2020-06-05 NOTE — Telephone Encounter (Signed)
      Pt calling to ask what OTC meds she can take for hoarseness. Declines appt, states she does not go out around people. Reports onset of hoarsness last Thursday. Had runny nose and eyes watery, resolved.. Now with mild sore throat, mostly concerned about voice. Again declines appt. Declines covid testing, has been vaccinated, not boosted. Home care advise given. Advised to contact pharmacist as best resource. Advised to CB if symptoms worsen. Pt verbalizes understanding.  Reason for Disposition . Mild hoarseness  Answer Assessment - Initial Assessment Questions 1. DESCRIPTION: "Describe your voice."     Hoarse 2. ONSET: "When did the hoarseness begin?"     Last Thursday 3. COUGH: "Is there a cough?" If Yes, ask: "How bad?"    Mild, "I always have a cough" 4. FEVER: "Do you have a fever?" If Yes, ask: "What is your temperature, how was it measured, and when did it start?"     No 5. RESPIRATORY STATUS: "Describe your breathing."      "No more than usual" 6. ALLERGIES: "Any allergy symptoms?" If Yes, ask: "What are they?"     Eyes watery 7. IRRITANTS: "Do you smoke?" "Have you been exposed to any irritating fumes?"     "Trying to stop." 8. CAUSE: "What do you think is causing the hoarseness?"     Unsure 9. OTHER SYMPTOMS: "Do you have any other symptoms?" (e.g., sore throat, swelling, foreign body, rash)     Mild sore throat  Protocols used: HOARSENESS-A-AH

## 2020-06-16 DIAGNOSIS — Z993 Dependence on wheelchair: Secondary | ICD-10-CM | POA: Diagnosis not present

## 2020-06-16 DIAGNOSIS — R3981 Functional urinary incontinence: Secondary | ICD-10-CM | POA: Diagnosis not present

## 2020-06-24 ENCOUNTER — Telehealth: Payer: Self-pay

## 2020-06-25 DIAGNOSIS — Z419 Encounter for procedure for purposes other than remedying health state, unspecified: Secondary | ICD-10-CM | POA: Diagnosis not present

## 2020-07-10 ENCOUNTER — Telehealth: Payer: Self-pay

## 2020-07-11 ENCOUNTER — Other Ambulatory Visit: Payer: Self-pay | Admitting: Nurse Practitioner

## 2020-07-11 ENCOUNTER — Other Ambulatory Visit: Payer: Self-pay | Admitting: Family Medicine

## 2020-07-11 DIAGNOSIS — R0602 Shortness of breath: Secondary | ICD-10-CM

## 2020-07-11 DIAGNOSIS — R059 Cough, unspecified: Secondary | ICD-10-CM

## 2020-07-11 DIAGNOSIS — E039 Hypothyroidism, unspecified: Secondary | ICD-10-CM

## 2020-07-12 ENCOUNTER — Ambulatory Visit: Payer: Self-pay

## 2020-07-16 DIAGNOSIS — R3981 Functional urinary incontinence: Secondary | ICD-10-CM | POA: Diagnosis not present

## 2020-07-16 DIAGNOSIS — Z993 Dependence on wheelchair: Secondary | ICD-10-CM | POA: Diagnosis not present

## 2020-07-26 ENCOUNTER — Telehealth: Payer: Self-pay

## 2020-07-26 DIAGNOSIS — Z419 Encounter for procedure for purposes other than remedying health state, unspecified: Secondary | ICD-10-CM | POA: Diagnosis not present

## 2020-07-29 ENCOUNTER — Ambulatory Visit: Payer: Self-pay | Admitting: Pharmacist

## 2020-07-29 NOTE — Progress Notes (Unsigned)
Chronic Care Management Pharmacy Note  07/29/2020 Name:  Cheryl Chandler MRN:  [id number] DOB:  Jul 28, 1965  Subjective: Cheryl Chandler is an 55 y.o. year old female who is a primary patient of Cheryl Billings, NP.  The CCM team was consulted for assistance with disease management and care coordination needs.    Engaged with patient by telephone for follow up visit in response to provider referral for pharmacy case management and/or care coordination services.   Consent to Services:  The patient was given information about Chronic Care Management services, agreed to services, and gave verbal consent prior to initiation of services.  Please see initial visit note for detailed documentation.   Patient Care Team: Cheryl Billings, NP as PCP - General [name], LCSW as Social Worker (Licensed Clinical Social Worker) Hall Busing, [name], RN as Case Manager (General Practice)  Recent office visits: None  Recent consult visits: none  Hospital visits: None in previous 6 months  Objective:  Lab Results  Component Value Date   CREATININE 0.84 04/03/2020   BUN 18 04/03/2020   GFRNONAA 80 04/03/2020   GFRAA 92 04/03/2020   NA 141 04/03/2020   K 4.2 04/03/2020   CALCIUM 8.8 04/03/2020   CO2 28 04/03/2020   GLUCOSE 128 (H) 04/03/2020    Lab Results  Component Value Date/Time   HGBA1C 5.5 04/03/2020 04:32 PM   HGBA1C 6.1 (H) 09/06/2019 02:24 PM   HGBA1C 5.7 (H) 05/03/2019 01:35 PM    Last diabetic Eye exam: No results found for: HMDIABEYEEXA  Last diabetic Foot exam: No results found for: HMDIABFOOTEX   Lab Results  Component Value Date   CHOL 136 09/06/2019   HDL 38 (L) 09/06/2019   LDLCALC 77 09/06/2019   TRIG 113 09/06/2019    Hepatic Function Latest Ref Rng & Units 09/06/2019 05/03/2019 01/13/2019  Total Protein 6.0 - 8.5 g/dL 7.0 6.5 6.2  Albumin 3.8 - 4.9 g/dL 4.0 3.5(L) 3.7(L)  AST 0 - 40 IU/L '12 19 22  ' ALT 0 - 32 IU/L '22 29 28  ' Alk Phosphatase 39  - 117 IU/L 80 72 75  Total Bilirubin 0.0 - 1.2 mg/dL 0.3 0.4 0.4    Lab Results  Component Value Date/Time   TSH 1.740 04/03/2020 04:37 PM   TSH 2.740 05/03/2019 01:35 PM    CBC Latest Ref Rng & Units 05/03/2019 10/03/2018 09/15/2018  WBC 3.4 - 10.8 x10E3/uL 8.2 9.1 8.8  Hemoglobin 11.1 - 15.9 g/dL 12.9 12.8 13.3  Hematocrit 34.0 - 46.6 % 41.6 38.8 41.0  Platelets 150 - 450 x10E3/uL 372 365 374    Lab Results  Component Value Date/Time   VD25OH 20.7 (L) 09/06/2019 02:24 PM   VD25OH 21.1 (L) 05/03/2019 01:35 PM    Clinical ASCVD: No  The 10-year ASCVD risk score Mikey Bussing DC Jr., et al., 2013) is: 7.3%   Values used to calculate the score:     Age: 58 years     Sex: Female     Is Non-Hispanic African American: No     Diabetic: No     Tobacco smoker: Yes     Systolic Blood Pressure: 378 mmHg     Is BP treated: Yes     HDL Cholesterol: 38 mg/dL     Total Cholesterol: 136 mg/dL    Depression screen [facility] 2/9 05/03/2019 09/30/2018  Decreased Interest 3 3  Down, Depressed, Hopeless 3 3  PHQ - 2 Score 6 6  Altered sleeping 3 3  Tired, decreased energy 3 3  Change in appetite 2 0  Feeling bad or failure about yourself  2 0  Trouble concentrating 3 0  Moving slowly or fidgety/restless 3 3  Suicidal thoughts 0 0  PHQ-9 Score 22 15  Difficult doing work/chores Somewhat difficult Not difficult at all    Social History   Tobacco Use  Smoking Status Current Every Day Smoker  . Packs/day: 0.25  . Years: 30.00  . Pack years: 7.50  . Types: Cigarettes  Smokeless Tobacco Never Used  Tobacco Comment   Patient quit smoking and recently started back-08/08/2019   BP Readings from Last 3 Encounters:  04/03/20 (!) 150/80  12/06/19 (!) 162/78  09/06/19 130/66   Pulse Readings from Last 3 Encounters:  04/03/20 73  12/06/19 81  09/06/19 73   Wt Readings from Last 3 Encounters:  12/06/19 (!) 316 lb (143.3 kg)  06/30/19 (!) 329 lb (149.2 kg)  05/03/19 (!) 329 lb (149.2 kg)   BMI  Readings from Last 3 Encounters:  12/06/19 54.24 kg/m  06/30/19 56.47 kg/m  05/03/19 56.47 kg/m    Assessment/Interventions: Review of patient past medical history, allergies, medications, health status, including review of consultants reports, laboratory and other test data, was performed as part of comprehensive evaluation and provision of chronic care management services.   SDOH:  (Social Determinants of Health) assessments and interventions performed: No  SDOH Screenings   Alcohol Screen: Not on file  Depression (NOM7-6): Not on file  Financial Resource Strain: High Risk  . Difficulty of Paying Living Expenses: Hard  Food Insecurity: Food Insecurity Present  . Worried About Charity fundraiser in the Last Year: Sometimes true  . Ran Out of Food in the Last Year: Sometimes true  Housing: Not on file  Physical Activity: Not on file  Social Connections: Not on file  Stress: Stress Concern Present  . Feeling of Stress : Rather much  Tobacco Use: High Risk  . Smoking Tobacco Use: Current Every Day Smoker  . Smokeless Tobacco Use: Never Used  Transportation Needs: Unmet Transportation Needs  . Lack of Transportation (Medical): Yes  . Lack of Transportation (Non-Medical): Yes      Immunization History  Administered Date(s) Administered  . Influenza,inj,Quad PF,6+ Mos 01/30/2013, 01/29/2014, 01/13/2019  . Moderna Sars-Covid-2 Vaccination 12/25/2019, 01/22/2020, 01/22/2020  . Pneumococcal Polysaccharide-23 01/30/2013    Conditions to be addressed/monitored:  Hypertension and Diabetes  There are no care plans that you recently modified to display for this patient.    Medication Assistance: None required.  Patient affirms current coverage meets needs.  Patient's preferred pharmacy is:  [facility], [place] [address] Phone: [phone] Fax: [phone]  Uses pill box? Yes Pt endorses 90% compliance   Care Plan  and Follow Up Patient Decision:  Patient agrees to Care Plan and Follow-up.  Plan: Telephone follow up appointment with care management team member scheduled for:  3 months.  Junita Push. Kenton Kingfisher PharmD, BCPS Clinical Pharmacist [facility] [phone]   Current Barriers:  . {pharmacybarriers:24917} . ***  Pharmacist Clinical Goal(s):  [name] Patient will {PHARMACYGOALCHOICES:24921} through collaboration with PharmD and provider.  . ***  Interventions: . 1:1 collaboration with Cheryl Billings, NP regarding development and update of comprehensive plan of care as evidenced by provider attestation and co-signature . Inter-disciplinary care team collaboration (see longitudinal plan of care) . Comprehensive medication review performed; medication list updated in electronic medical  record .  BP Readings from Last 3 Encounters:  04/03/20 (!) 150/80  12/06/19 (!) 162/78  09/06/19 130/66    Hypertension (BP goal <130/80) -Controlled -Current treatment: . Lisinopril 40 mg qd . Furosemide 2m qd -Medications previously tried: NA -Current home readings: 175/180/195  Over 70s -Reports hypotensive/hypertensive symptoms -Educated on BP goals and benefits of medications for prevention of heart attack, stroke and kidney damage; Daily salt intake goal < 2300 mg; Importance of home blood pressure monitoring; Symptoms of hypotension and importance of maintaining adequate hydration; -Counseled to monitor BP at home ***, document, and provide log at future appointments -{CCMPHARMDINTERVENTION:25122}  Lab Results  Component Value Date   HGBA1C 5.5 04/03/2020    Diabetes (A1c goal <7%) -Controlled -Current medications: . Trulicity 00.63mg qd -Medications previously tried:  -Current home glucose readings . fasting glucose: 100s  . post prandial glucose: 200 was the highest she remembers -Denies hypoglycemic/hyperglycemic symptoms -Current meal patterns:  . drinks: water with  zero sugar flavor packets -Current exercise: does leg exercises in wheel -Educated on A1c and blood sugar goals; Complications of diabetes including kidney damage, retinal damage, and cardiovascular disease; Benefits of weight loss; Benefits of routine self-monitoring of blood sugar; -Counseled to check feet daily and get yearly eye exams -Counseled on diet and exercise extensively Collaborated with pcp    Patient Goals/Self-Care Activities . Patient will:  - take medications as prescribed focus on medication adherence by fill dates, pill box check glucose daily, document, and provide at future appointments check blood pressure daily, document, and provide at future appointments  Follow Up Plan: Telephone follow up appointment with care management team member scheduled for: 3 months

## 2020-07-31 ENCOUNTER — Other Ambulatory Visit: Payer: Self-pay | Admitting: Nurse Practitioner

## 2020-07-31 DIAGNOSIS — R059 Cough, unspecified: Secondary | ICD-10-CM

## 2020-07-31 DIAGNOSIS — R0602 Shortness of breath: Secondary | ICD-10-CM

## 2020-08-05 ENCOUNTER — Ambulatory Visit (INDEPENDENT_AMBULATORY_CARE_PROVIDER_SITE_OTHER): Payer: Medicaid Other | Admitting: Vascular Surgery

## 2020-08-05 ENCOUNTER — Other Ambulatory Visit: Payer: Self-pay

## 2020-08-05 ENCOUNTER — Encounter (INDEPENDENT_AMBULATORY_CARE_PROVIDER_SITE_OTHER): Payer: Self-pay | Admitting: Vascular Surgery

## 2020-08-05 VITALS — BP 138/72 | HR 77 | Ht 64.0 in | Wt 300.0 lb

## 2020-08-05 DIAGNOSIS — J449 Chronic obstructive pulmonary disease, unspecified: Secondary | ICD-10-CM

## 2020-08-05 DIAGNOSIS — I890 Lymphedema, not elsewhere classified: Secondary | ICD-10-CM

## 2020-08-05 DIAGNOSIS — E785 Hyperlipidemia, unspecified: Secondary | ICD-10-CM

## 2020-08-05 DIAGNOSIS — I872 Venous insufficiency (chronic) (peripheral): Secondary | ICD-10-CM

## 2020-08-05 NOTE — Progress Notes (Signed)
MRN : [id number]  Cheryl Chandler is a 55 y.o. ([birth year]) female who presents with chief complaint of No chief complaint on file. [name]  History of Present Illness:   The patient returns to the office for followup evaluation regarding leg swelling.  The swelling has persisted but with the lymph pump is much, much better controlled. The pain associated with swelling is essentially eliminated. There have not been any interval development of a ulcerations or wounds.  The patient denies problems with the pump, noting it is working well and the leggings are in good condition.  Since the previous visit the patient has been wearing graduated compression stockings occasionally.  She notes that she developed a skin tear taking off her stockings and this resulted in a cellulitis.  That is the only interval complication of the past year.  She notes she is continuing to use the lymph pump on a routine basis and  has noted significant improvement in the lymphedema.   Duplex ultrasound shows deep reflux bilaterally  No outpatient medications have been marked as taking for the 08/05/20 encounter (Appointment) with Gilda Crease, Latina Craver, MD.    Past Medical History:  Diagnosis Date  . Arthritis   . Asthma   . COPD (chronic obstructive pulmonary disease) (HCC)   . Hypertension   . Neuropathy   . Pericarditis   . Personal history of urinary calculi 11/29/2014  . Thyroid disease     Past Surgical History:  Procedure Laterality Date  . APPENDECTOMY    . arm surgery Left    pins  . fatty tumor removed from back    . JOINT REPLACEMENT Right   . LEG SURGERY Bilateral   . OVARIAN CYST SURGERY Right    took piece of right ovary due to cyst  . TOTAL HIP ARTHROPLASTY Right   . wisdom teeth removed      Social History Social History   Tobacco Use  . Smoking status: Current Every Day Smoker    Packs/day: 0.25    Years: 30.00    Pack years: 7.50    Types: Cigarettes  . Smokeless tobacco:  Never Used  . Tobacco comment: Patient quit smoking and recently started back-08/08/2019  Vaping Use  . Vaping Use: Never used  Substance Use Topics  . Alcohol use: No    Alcohol/week: 0.0 standard drinks  . Drug use: No    Family History Family History  Problem Relation Age of Onset  . Seizures Sister        disorders    Allergies  Allergen Reactions  . Codeine Shortness Of Breath and Rash  . Percocet [Oxycodone-Acetaminophen] Shortness Of Breath  . Sulfa Antibiotics Shortness Of Breath and Rash  . Aspirin Hives  . Bee Venom     Bee stings  . Darvon [Propoxyphene]     Darvocet-N 100  . Latex   . Oxycodone Nausea And Vomiting  . Penicillins     Has patient had a PCN reaction causing immediate rash, facial/tongue/throat swelling, SOB or lightheadedness with hypotension: Unknown Has patient had a PCN reaction causing severe rash involving mucus membranes or skin necrosis: Unknown Has patient had a PCN reaction that required hospitalization: Unknown Has patient had a PCN reaction occurring within the last 10 years: Unknown If all of the above answers are "NO", then may proceed with Cephalosporin use.     REVIEW OF SYSTEMS (Negative unless checked)  Constitutional: [] Weight loss  [] Fever  [] Chills Cardiac: [] Chest pain   []   Chest pressure   [] Palpitations   [] Shortness of breath when laying flat   [] Shortness of breath with exertion. Vascular:  [] Pain in legs with walking   [] Pain in legs at rest  [] History of DVT   [] Phlebitis   [x] Swelling in legs   [] Varicose veins   [] Non-healing ulcers Pulmonary:   [] Uses home oxygen   [] Productive cough   [] Hemoptysis   [] Wheeze  [] COPD   [] Asthma Neurologic:  [] Dizziness   [] Seizures   [] History of stroke   [] History of TIA  [] Aphasia   [] Vissual changes   [] Weakness or numbness in arm   [] Weakness or numbness in leg Musculoskeletal:   [] Joint swelling   [] Joint pain   [] Low back pain Hematologic:  [] Easy bruising  [] Easy bleeding    [] Hypercoagulable state   [] Anemic Gastrointestinal:  [] Diarrhea   [] Vomiting  [] Gastroesophageal reflux/heartburn   [] Difficulty swallowing. Genitourinary:  [] Chronic kidney disease   [] Difficult urination  [] Frequent urination   [] Blood in urine Skin:  [] Rashes   [] Ulcers  Psychological:  [] History of anxiety   []  History of major depression.  Physical Examination  There were no vitals filed for this visit. There is no height or weight on file to calculate BMI. Gen: WD/WN, NAD, seen in a wheelchair Head: [place]/AT, No temporalis wasting.  Ear/Nose/Throat: Hearing grossly intact, nares w/o erythema or drainage Eyes: PER, EOMI, sclera nonicteric.  Neck: Supple, no large masses.   Pulmonary:  Good air movement, no audible wheezing bilaterally, no use of accessory muscles.  Cardiac: RRR, no JVD Vascular: scattered varicosities present bilaterally.  Severe venous stasis changes to the legs bilaterally.  4+ soft pitting edema Vessel Right Left  Radial Palpable Palpable  PT Palpable Palpable  DP Palpable Palpable  Gastrointestinal: Non-distended. No guarding/no peritoneal signs.  Musculoskeletal: M/S 5/5 throughout.  No deformity or atrophy.  Neurologic: CN 2-12 intact. Symmetrical.  Speech is fluent. Motor exam as listed above. Psychiatric: Judgment intact, Mood & affect appropriate for pt's clinical situation. Dermatologic: Severe venous rashes no ulcers noted.  No changes consistent with cellulitis. Lymph : + lichenification or skin changes of chronic lymphedema.  CBC Lab Results  Component Value Date   WBC 8.2 05/03/2019   HGB 12.9 05/03/2019   HCT 41.6 05/03/2019   MCV 91 05/03/2019   PLT 372 05/03/2019    BMET    Component Value Date/Time   NA 141 04/03/2020 1637   NA 135 08/05/2014 1306   K 4.2 04/03/2020 1637   K 4.5 08/05/2014 1306   CL 102 04/03/2020 1637   CL 102 08/05/2014 1306   CO2 28 04/03/2020 1637   CO2 25 08/05/2014 1306   GLUCOSE 128 (H) 04/03/2020 1637    GLUCOSE 110 (H) 09/15/2018 1215   GLUCOSE 136 (H) 08/05/2014 1306   BUN 18 04/03/2020 1637   BUN 23 (H) 08/05/2014 1306   CREATININE 0.84 04/03/2020 1637   CREATININE 0.86 08/05/2014 1306   CALCIUM 8.8 04/03/2020 1637   CALCIUM 8.2 (L) 08/05/2014 1306   GFRNONAA 80 04/03/2020 1637   GFRNONAA >60 08/05/2014 1306   GFRAA 92 04/03/2020 1637   GFRAA >60 08/05/2014 1306   CrCl cannot be calculated (Patient's most recent lab result is older than the maximum 21 days allowed.).  COAG No results found for: INR, PROTIME  Radiology No results found.   Assessment/Plan 1. Lymphedema  No surgery or intervention at this point in time.    I have reviewed my discussion with the patient regarding  lymphedema and why it  causes symptoms.  Patient will continue wearing graduated compression stockings class 1 (20-30 mmHg) on a daily basis a prescription was given. The patient is reminded to put the stockings on first thing in the morning and removing them in the evening. The patient is instructed specifically not to sleep in the stockings.   In addition, behavioral modification throughout the day will be continued.  This will include frequent elevation (such as in a recliner), use of over the counter pain medications as needed and exercise such as walking.  I have reviewed systemic causes for chronic edema such as liver, kidney and cardiac etiologies and there does not appear to be any significant changes in these organ systems over the past year.  The patient is under the impression that these organ systems are all stable and unchanged.    The patient will continue aggressive use of the  lymph pump.  This will continue to improve the edema control and prevent sequela such as ulcers and infections.   The patient will follow-up with me on an annual basis.    2. Chronic venous insufficiency  No surgery or intervention at this point in time.    I have reviewed my discussion with the patient regarding  lymphedema and why it  causes symptoms.  Patient will continue wearing graduated compression stockings class 1 (20-30 mmHg) on a daily basis a prescription was given. The patient is reminded to put the stockings on first thing in the morning and removing them in the evening. The patient is instructed specifically not to sleep in the stockings.   In addition, behavioral modification throughout the day will be continued.  This will include frequent elevation (such as in a recliner), use of over the counter pain medications as needed and exercise such as walking.  I have reviewed systemic causes for chronic edema such as liver, kidney and cardiac etiologies and there does not appear to be any significant changes in these organ systems over the past year.  The patient is under the impression that these organ systems are all stable and unchanged.    The patient will continue aggressive use of the  lymph pump.  This will continue to improve the edema control and prevent sequela such as ulcers and infections.   The patient will follow-up with me on an annual basis.    3. Chronic obstructive pulmonary disease, unspecified COPD type (HCC) Continue pulmonary medications and aerosols as already ordered, these medications have been reviewed and there are no changes at this time.    4. Hyperlipidemia, unspecified hyperlipidemia type Continue statin as ordered and reviewed, no changes at this time    Levora Dredge, MD  08/05/2020 12:42 PM

## 2020-08-07 ENCOUNTER — Telehealth: Payer: Self-pay

## 2020-08-07 ENCOUNTER — Telehealth: Payer: Self-pay | Admitting: General Practice

## 2020-08-07 NOTE — Telephone Encounter (Signed)
  Chronic Care Management   Outreach Note  08/07/2020 Name: Cheryl Chandler MRN: [id number] DOB: Sep 21, 1965  Referred by: [name], NP Reason for referral : Appointment (RNCM: Follow up for Chronic Disease Management and Care Coordination Needs )   An unsuccessful telephone outreach was attempted today. The patient was referred to the case management team for assistance with care management and care coordination.   Follow Up Plan: A HIPAA compliant phone message was left for the patient providing contact information and requesting a return call.   Alto Denver RN, MSN, CCM Community Care Coordinator [place]  [facility] Mobile: [phone]

## 2020-08-12 ENCOUNTER — Other Ambulatory Visit: Payer: Self-pay | Admitting: Nurse Practitioner

## 2020-08-12 DIAGNOSIS — R059 Cough, unspecified: Secondary | ICD-10-CM

## 2020-08-12 DIAGNOSIS — E039 Hypothyroidism, unspecified: Secondary | ICD-10-CM

## 2020-08-12 DIAGNOSIS — R0602 Shortness of breath: Secondary | ICD-10-CM

## 2020-08-14 ENCOUNTER — Telehealth: Payer: Self-pay

## 2020-08-14 NOTE — Chronic Care Management (AMB) (Signed)
  Care Management   Note  08/14/2020 Name: Cheryl Chandler MRN: [id number] DOB: 10/11/1965  Cheryl Chandler is a 55 y.o. year old female who is a primary care patient of [name], NP and is actively engaged with the care management team. I reached out to Cheryl Chandler by phone today to assist with re-scheduling a follow up visit with the RN Case Manager  Follow up plan: Unsuccessful telephone outreach attempt made. A HIPAA compliant phone message was left for the patient providing contact information and requesting a return call.  The care management team will reach out to the patient again over the next 7 days.  If patient returns call to provider office, please advise to call Embedded Care Management Care Guide Cheryl Chandler  at [phone]  Cheryl Chandler, RMA Care Guide, Embedded Care Coordination [facility]  [place], [place] [address] Direct Dial: [phone] [email or web address] Website: [email or web address]

## 2020-08-15 DIAGNOSIS — Z993 Dependence on wheelchair: Secondary | ICD-10-CM | POA: Diagnosis not present

## 2020-08-15 DIAGNOSIS — R3981 Functional urinary incontinence: Secondary | ICD-10-CM | POA: Diagnosis not present

## 2020-08-19 ENCOUNTER — Telehealth: Payer: Self-pay

## 2020-08-19 NOTE — Telephone Encounter (Signed)
Copied from CRM [id number]. Topic: General - Other >> Aug 19, 2020 12:03 PM Gwenlyn Fudge wrote: Reason for CRM: Pt called and is requesting to have a prescription for energy drinks. She states that she heard of this from her [facility] service. She states that she is having some fatigue. Please advise.   Lvm to ask what kind of drink she is requesting? And also schedule apt .

## 2020-08-20 NOTE — Telephone Encounter (Signed)
Lvm to ask what kind of drink she is requesting? And also schedule apt .  

## 2020-08-21 NOTE — Telephone Encounter (Signed)
Lvm to ask what kind of drink she is requesting? And also schedule apt .  

## 2020-08-22 NOTE — Telephone Encounter (Signed)
Lvm to ask what kind of drink she is requesting? And also schedule apt .

## 2020-08-22 NOTE — Chronic Care Management (AMB) (Signed)
  Care Management   Note  08/22/2020 Name: Cheryl Chandler MRN: [id number] DOB: 30-Nov-1965  Cheryl Chandler is a 55 y.o. year old female who is a primary care patient of [name], NP and is actively engaged with the care management team. I reached out to [place] Guadalupe Dawn by phone today to assist with re-scheduling a follow up visit with the RN Case Manager  Follow up plan: Unsuccessful telephone outreach attempt made. A HIPAA compliant phone message was left for the patient providing contact information and requesting a return call.  The care management team will reach out to the patient again over the next 7 days.  If patient returns call to provider office, please advise to call Embedded Care Management Care Guide Penne Lash  at [phone]  Penne Lash, RMA Care Guide, Embedded Care Coordination [facility]  [place], [place] [address] Direct Dial: [phone] [email or web address]: [email or web address]

## 2020-08-25 DIAGNOSIS — Z419 Encounter for procedure for purposes other than remedying health state, unspecified: Secondary | ICD-10-CM | POA: Diagnosis not present

## 2020-08-27 ENCOUNTER — Telehealth: Payer: Self-pay

## 2020-08-27 NOTE — Chronic Care Management (AMB) (Signed)
  Care Management   Note  08/27/2020 Name: Cheryl Chandler MRN: [id number] DOB: Jan 28, 1966  Cheryl Chandler is a 55 y.o. year old female who is a primary care patient of [name], NP and is actively engaged with the care management team. I reached out to [place] Guadalupe Dawn by phone today to assist with re-scheduling a follow up visit with the RN Case Manager  Follow up plan: Unable to make contact on outreach attempts x 3. PCP [name], NP notified via routed documentation in medical record.   Penne Lash, RMA Care Guide, Embedded Care Coordination [facility]  [place], [place] [address] Direct Dial: [phone] [email or web address]: [email or web address]

## 2020-08-27 NOTE — Telephone Encounter (Signed)
3rd unsuccessful outreach to reschedule f/u  °

## 2020-09-15 DIAGNOSIS — R3981 Functional urinary incontinence: Secondary | ICD-10-CM | POA: Diagnosis not present

## 2020-09-15 DIAGNOSIS — Z993 Dependence on wheelchair: Secondary | ICD-10-CM | POA: Diagnosis not present

## 2020-09-20 ENCOUNTER — Other Ambulatory Visit: Payer: Self-pay | Admitting: Nurse Practitioner

## 2020-09-20 ENCOUNTER — Other Ambulatory Visit: Payer: Self-pay | Admitting: Family Medicine

## 2020-09-20 NOTE — Telephone Encounter (Signed)
Requested medication (s) are due for refill today: Yes  Requested medication (s) are on the active medication list: Yes  Last refill:  2 and 4 months ago  Future visit scheduled: Yes  Notes to clinic:  Unable to refill per protocol, last refill by another provider.      Requested Prescriptions  Pending Prescriptions Disp Refills   TRULICITY 0.75 MG/0.5ML SOPN [Pharmacy Med Name: TRULICITY 0.75 MG/0.5ML SUBQ SOLN M] 2 mL 2    Sig: INJECT 0.75MG  SUBQ ONCE A WEEK      Endocrinology:  Diabetes - GLP-1 Receptor Agonists Passed - 09/20/2020 12:10 PM      Passed - HBA1C is between 0 and 7.9 and within 180 days    HB A1C (BAYER DCA - WAIVED)  Date Value Ref Range Status  04/03/2020 5.5 <7.0 % Final    Comment:                                          Diabetic Adult            <7.0                                       Healthy Adult        4.3 - 5.7                                                           (DCCT/NGSP) American Diabetes Association's Summary of Glycemic Recommendations for Adults with Diabetes: Hemoglobin A1c <7.0%. More stringent glycemic goals (A1c <6.0%) may further reduce complications at the cost of increased risk of hypoglycemia.           Passed - Valid encounter within last 6 months    Recent Outpatient Visits           5 months ago Adult hypothyroidism   [facility] Caro Laroche, DO   9 months ago Lymphedema   [facility] Roosvelt Maser [place], [place]   10 months ago Primary insomnia   [facility] Roosvelt Maser [place], [place]   1 year ago IFG (impaired fasting glucose)   [facility], [place], [place]   1 year ago Upper respiratory tract infection, unspecified type   [facility], [place], [place]       Future Appointments             In 2 weeks [name], NP [facility], [facility]               citalopram (CELEXA) 20 MG tablet  [Pharmacy Med Name: CITALOPRAM HYDROBROMIDE 20 MG TAB] 90 tablet 0    Sig: TAKE 1 TABLET BY MOUTH ONCE DAILY      Psychiatry:  Antidepressants - SSRI Failed - 09/20/2020 12:10 PM      Failed - Completed PHQ-2 or PHQ-9 in the last 360 days      Passed - Valid encounter within last 6 months    Recent Outpatient Visits           5 months ago Adult hypothyroidism  [facility] Caro Laroche, DO   9 months ago Lymphedema   [facility] Roosvelt Maser [place], [place]   10 months ago Primary insomnia   [facility] Roosvelt Maser [place], [place]   1 year ago IFG (impaired fasting glucose)   [facility] Roosvelt Maser [place], [place]   1 year ago Upper respiratory tract infection, unspecified type   [facility], [place], [place]       Future Appointments             In 2 weeks [name], NP [facility]               simvastatin (ZOCOR) 40 MG tablet [Pharmacy Med Name: SIMVASTATIN 40 MG TAB] 90 tablet 0    Sig: TAKE 1 TABLET BY MOUTH AT BEDTIME      Cardiovascular:  Antilipid - Statins Failed - 09/20/2020 12:10 PM      Failed - Total Cholesterol in normal range and within 360 days    Cholesterol, Total  Date Value Ref Range Status  09/06/2019 136 100 - 199 mg/dL Final          Failed - LDL in normal range and within 360 days    LDL Chol Calc (NIH)  Date Value Ref Range Status  09/06/2019 77 0 - 99 mg/dL Final          Failed - HDL in normal range and within 360 days    HDL  Date Value Ref Range Status  09/06/2019 38 (L) >39 mg/dL Final          Failed - Triglycerides in normal range and within 360 days    Triglycerides  Date Value Ref Range Status  09/06/2019 113 0 - 149 mg/dL Final          Passed - Patient is not pregnant      Passed - Valid encounter within last 12 months    Recent Outpatient Visits           5 months ago Adult hypothyroidism   [facility] Caro Laroche, DO   9 months ago Lymphedema   [facility] [name], [place]   10 months ago Primary insomnia   [facility], [place], [place]   1 year ago IFG (impaired fasting glucose)   [facility], [place], [place]   1 year ago Upper respiratory tract infection, unspecified type   [facility], [place], [place]       Future Appointments             In 2 weeks [name], NP [facility], [facility]

## 2020-09-20 NOTE — Telephone Encounter (Signed)
Pt has apt on 10/09/2020 

## 2020-09-24 ENCOUNTER — Telehealth: Payer: Self-pay

## 2020-09-24 NOTE — Telephone Encounter (Signed)
PA for Seroquel initiated and submitted via Cover My Meds. Key: [facility]

## 2020-09-25 DIAGNOSIS — Z419 Encounter for procedure for purposes other than remedying health state, unspecified: Secondary | ICD-10-CM | POA: Diagnosis not present

## 2020-09-27 ENCOUNTER — Other Ambulatory Visit: Payer: Self-pay

## 2020-09-27 ENCOUNTER — Encounter: Payer: Self-pay | Admitting: Nurse Practitioner

## 2020-09-27 ENCOUNTER — Ambulatory Visit (INDEPENDENT_AMBULATORY_CARE_PROVIDER_SITE_OTHER): Payer: Medicaid Other | Admitting: Nurse Practitioner

## 2020-09-27 ENCOUNTER — Ambulatory Visit: Payer: Self-pay | Admitting: *Deleted

## 2020-09-27 VITALS — BP 166/74 | HR 96 | Temp 98.8°F

## 2020-09-27 DIAGNOSIS — W57XXXA Bitten or stung by nonvenomous insect and other nonvenomous arthropods, initial encounter: Secondary | ICD-10-CM | POA: Diagnosis not present

## 2020-09-27 DIAGNOSIS — S90861A Insect bite (nonvenomous), right foot, initial encounter: Secondary | ICD-10-CM

## 2020-09-27 MED ORDER — EPINEPHRINE 0.3 MG/0.3ML IJ SOAJ: INTRAMUSCULAR | 3 refills | Status: DC

## 2020-09-27 MED ORDER — DOXYCYCLINE HYCLATE 100 MG PO TABS: 100.0000 mg | ORAL_TABLET | Freq: Two times a day (BID) | ORAL | 0 refills | Status: DC

## 2020-09-27 NOTE — Telephone Encounter (Signed)
scheduled

## 2020-09-27 NOTE — Telephone Encounter (Signed)
FYI

## 2020-09-27 NOTE — Progress Notes (Signed)
BP (!) 166/74   Pulse 96   Temp 98.8 F (37.1 C)   LMP 06/09/2017 (Approximate)   SpO2 96%    Subjective:    Patient ID: Cheryl Chandler, female    DOB: 01/05/1966, 55 y.o.   MRN: [id number]  HPI: Cheryl Chandler is a 55 y.o. female  Chief Complaint  Patient presents with  . Foot Pain    Heel area of right foot   INSECT BITE Patient states she woke up early this morning and felt like a bee had stung her on the bottom of her foot.  Patient states she is having pain on her heel that is preventing her from walking. Feels like it is swollen.    Relevant past medical, surgical, family and social history reviewed and updated as indicated. Interim medical history since our last visit reviewed. Allergies and medications reviewed and updated.  Review of Systems  Skin:       Insect bite     Per HPI unless specifically indicated above     Objective:    BP (!) 166/74   Pulse 96   Temp 98.8 F (37.1 C)   LMP 06/09/2017 (Approximate)   SpO2 96%   Wt Readings from Last 3 Encounters:  08/05/20 300 lb (136.1 kg)  12/06/19 (!) 316 lb (143.3 kg)  06/30/19 (!) 329 lb (149.2 kg)    Physical Exam Vitals and nursing note reviewed.  Constitutional:      General: She is not in acute distress.    Appearance: Normal appearance. She is normal weight. She is not ill-appearing, toxic-appearing or diaphoretic.  HENT:     Head: Normocephalic.     Right Ear: External ear normal.     Left Ear: External ear normal.     Nose: Nose normal.     Mouth/Throat:     Mouth: Mucous membranes are moist.     Pharynx: Oropharynx is clear.  Eyes:     General:        Right eye: No discharge.        Left eye: No discharge.     Extraocular Movements: Extraocular movements intact.     Conjunctiva/sclera: Conjunctivae normal.     Pupils: Pupils are equal, round, and reactive to light.  Cardiovascular:     Rate and Rhythm: Normal rate and regular rhythm.     Heart sounds: No murmur  heard.   Pulmonary:     Effort: Pulmonary effort is normal. No respiratory distress.     Breath sounds: Normal breath sounds. No wheezing or rales.  Musculoskeletal:     Cervical back: Normal range of motion and neck supple.  Skin:    General: Skin is warm and dry.     Capillary Refill: Capillary refill takes less than 2 seconds.       Neurological:     General: No focal deficit present.     Mental Status: She is alert and oriented to person, place, and time. Mental status is at baseline.  Psychiatric:        Mood and Affect: Mood normal.        Behavior: Behavior normal.        Thought Content: Thought content normal.        Judgment: Judgment normal.     Results for orders placed or performed in visit on 04/03/20  Fecal occult blood, imunochemical   Specimen: Stool   ST  Result Value Ref Range   Fecal Occult  Bld Negative Negative  Bayer DCA Hb A1c Waived (STAT)  Result Value Ref Range   HB A1C (BAYER DCA - WAIVED) 5.5 <7.0 %  TSH  Result Value Ref Range   TSH 1.740 0.450 - 4.500 uIU/mL  Basic Metabolic Panel (BMET)  Result Value Ref Range   Glucose 128 (H) 65 - 99 mg/dL   BUN 18 6 - 24 mg/dL   Creatinine, Ser 4.09 0.57 - 1.00 mg/dL   GFR calc non Af Amer 80 >59 mL/min/1.73   GFR calc Af Amer 92 >59 mL/min/1.73   BUN/Creatinine Ratio 21 9 - 23   Sodium 141 134 - 144 mmol/L   Potassium 4.2 3.5 - 5.2 mmol/L   Chloride 102 96 - 106 mmol/L   CO2 28 20 - 29 mmol/L   Calcium 8.8 8.7 - 10.2 mg/dL  Hepatitis C antibody  Result Value Ref Range   Hep C Virus Ab <0.1 0.0 - 0.9 s/co ratio  HIV antibody (with reflex)  Result Value Ref Range   HIV Screen 4th Generation wRfx Non Reactive Non Reactive      Assessment & Plan:   Problem List Items Addressed This Visit   None   Visit Diagnoses    Insect bite of right foot, initial encounter    -  Primary   Doxycycline prescribed to the patient. Complete course of antibiotics. Return to clinic if symptoms worsen or fail  to improve.        Follow up plan: Return if symptoms worsen or fail to improve.   A total of 20 minutes were spent on this encounter today.  When total time is documented, this includes both the face-to-face and non-face-to-face time personally spent before, during and after the visit on the date of the encounter.

## 2020-09-27 NOTE — Telephone Encounter (Signed)
Patient has injury to bottom of foot-sore and burning in foot  this morning- not sure how injury occurred. Patient states she has pain in bottom of foot- she states she has pain to stand/put weight on foot. Patient can not see bottom of foot- but reports her husband looked at it and states it has black/blue area. Not sure if puncture wound or bite. Patient states she had some breathing problems this morning- but she has chronic breathing problems and has no problem breathing now. Advised UC for evaluation- patient state she has money problems- she is going to have her sister come over and she will go if she feels it is bad enough. Advised per protocol- UC as soon as possible- no appointment in office within disposition.  Reason for Disposition . [1] SEVERE pain AND [2] not improved 2 hours after pain medicine  Answer Assessment - Initial Assessment Questions 1. APPEARANCE of INJURY: "What does the injury look like?"      Black and blue 2. SIZE: "How large is the cut?"      Looks black or blue- no cut or bleeding 3. BLEEDING: "Is it bleeding now?" If Yes, ask: "Is it difficult to stop?"      none 4. LOCATION: "Where is the injury located?"      Bottom of R foot 5. ONSET: "How long ago did the injury occur?"     Not sure 6. MECHANISM: "Tell me how it happened."      Not sure- may have stepped on something 7. TETANUS: "When was the last tetanus booster?"     Not sure 8. PREGNANCY: "Is there any chance you are pregnant?" "When was your last menstrual period?"     n/a  Protocols used: CUTS AND LACERATIONS-A-AH

## 2020-09-30 ENCOUNTER — Ambulatory Visit: Payer: Self-pay | Admitting: Licensed Clinical Social Worker

## 2020-09-30 NOTE — Chronic Care Management (AMB) (Signed)
[facility]) Care Management  09/30/2020  Cheryl Chandler 1965/08/12 [id number]  Ongoing LCSW management will be transitioned to the High-Risk Managed Medicaid LCSW [name]. Patient remains active and has been notified of assignment change.   [name], MSW, LCSW [facility] Care Management [name]  [facility] [name] Phone [phone] 12:07 PM

## 2020-10-08 ENCOUNTER — Other Ambulatory Visit: Payer: Self-pay | Admitting: Nurse Practitioner

## 2020-10-08 NOTE — Telephone Encounter (Signed)
Requested medication (s) are due for refill today:   Not sure up to provider  Requested medication (s) are on the active medication list:   Yes  Future visit scheduled:   Yes   Last ordered: 09/27/2020 #14, 0 refills  Returned because pt requesting a refill and there is not a protocol assigned to this medication.   Requested Prescriptions  Pending Prescriptions Disp Refills   doxycycline (VIBRA-TABS) 100 MG tablet 14 tablet 0    Sig: Take 1 tablet (100 mg total) by mouth 2 (two) times daily.      Off-Protocol Failed - 10/08/2020  9:03 AM      Failed - Medication not assigned to a protocol, review manually.      Passed - Valid encounter within last 12 months    Recent Outpatient Visits           1 week ago Insect bite of right foot, initial encounter   [facility] [name], NP   6 months ago Adult hypothyroidism   [facility] Caro Laroche, DO   10 months ago Lymphedema   [facility] Roosvelt Maser [place], [place]   11 months ago Primary insomnia   [facility] Roosvelt Maser [place], [place]   1 year ago IFG (impaired fasting glucose)   [facility], [place], [place]       Future Appointments             In 1 month [name], NP [facility], [facility]

## 2020-10-08 NOTE — Telephone Encounter (Deleted)
p 

## 2020-10-08 NOTE — Telephone Encounter (Addendum)
Medication Refill : doxycycline (VIBRA-TABS) 100 MG tablet   Has the patient contacted their pharmacy? yes  (Agent: If yes, when and what did the pharmacy advise?) no refills contact pcp  Preferred Pharmacy (with phone number or street name):[facility] [place], [place] - [address] [phone]  Agent: Please be advised that RX refills may take up to 3 business days. We ask that you follow-up with your pharmacy.

## 2020-10-08 NOTE — Telephone Encounter (Signed)
Requested medication (s) are due for refill today: no  Requested medication (s) are on the active medication list: yes   Last refill: 09/27/2020  Future visit scheduled: yes   Notes to clinic: medication not assigned to a protocol   Requested Prescriptions  Pending Prescriptions Disp Refills   doxycycline (VIBRA-TABS) 100 MG tablet 14 tablet 0    Sig: Take 1 tablet (100 mg total) by mouth 2 (two) times daily.      Off-Protocol Failed - 10/08/2020  9:04 AM      Failed - Medication not assigned to a protocol, review manually.      Passed - Valid encounter within last 12 months    Recent Outpatient Visits           1 week ago Insect bite of right foot, initial encounter   [facility] [name], NP   6 months ago Adult hypothyroidism   [facility] Caro Laroche, DO   10 months ago Lymphedema   [facility] Roosvelt Maser [place], [place]   11 months ago Primary insomnia   [facility] Roosvelt Maser [place], [place]   1 year ago IFG (impaired fasting glucose)   [facility], [place], [place]       Future Appointments             In 1 month [name], NP [facility], [facility]

## 2020-10-08 NOTE — Telephone Encounter (Signed)
Scheduled 8/10 recently seen

## 2020-10-09 ENCOUNTER — Ambulatory Visit: Payer: Medicaid Other | Admitting: Nurse Practitioner

## 2020-10-11 ENCOUNTER — Other Ambulatory Visit: Payer: Self-pay | Admitting: Nurse Practitioner

## 2020-10-11 DIAGNOSIS — R0602 Shortness of breath: Secondary | ICD-10-CM

## 2020-10-11 DIAGNOSIS — R059 Cough, unspecified: Secondary | ICD-10-CM

## 2020-10-11 DIAGNOSIS — J301 Allergic rhinitis due to pollen: Secondary | ICD-10-CM

## 2020-10-11 MED ORDER — MONTELUKAST SODIUM 10 MG PO TABS: 1.0000 | ORAL_TABLET | Freq: Every day | ORAL | 0 refills | Status: DC

## 2020-10-11 MED ORDER — ALBUTEROL SULFATE (2.5 MG/3ML) 0.083% IN NEBU: INHALATION_SOLUTION | RESPIRATORY_TRACT | 0 refills | Status: DC

## 2020-10-11 NOTE — Telephone Encounter (Signed)
Medication Refill - Medication:  -montelukast (SINGULAIR) 10 MG tablet  -meloxicam (MOBIC) 15 MG tablet -citalopram (CELEXA) 20 MG tablet  -gabapentin (NEURONTIN) 300 MG capsule -simvastatin (ZOCOR) 40 MG tablet  -albuterol (PROVENTIL) (2.5 MG/3ML) 0.083% nebulizer solution -SPIRIVA HANDIHALER 18 MCG inhalation capsule  -Inhaler   Has the patient contacted their pharmacy? Yes.   No refills  Preferred Pharmacy (with phone number or street name): [facility] - [place], [place] - [address]  Phone:  [phone] Fax:  [phone]  Agent: Please be advised that RX refills may take up to 3 business days. We ask that you follow-up with your pharmacy.

## 2020-10-14 ENCOUNTER — Other Ambulatory Visit: Payer: Self-pay

## 2020-10-14 ENCOUNTER — Telehealth: Payer: Self-pay | Admitting: Nurse Practitioner

## 2020-10-14 ENCOUNTER — Telehealth: Payer: Self-pay

## 2020-10-14 NOTE — Patient Outreach (Signed)
Medicaid Managed Care    Pharmacy Note  10/14/2020 Name: Cheryl Chandler MRN: [id number] DOB: 1965-08-08  Cheryl Chandler is a 55 y.o. year old female who is a primary care patient of [name], NP. The [facility] Managed Care Coordination team was consulted for assistance with disease management and care coordination needs.    Engaged with patient Engaged with patient by telephone for initial visit in response to referral for case management and/or care coordination services.  [name] was given information about Managed Medicaid Care Coordination team services today. Cheryl Chandler agreed to services and verbal consent obtained.   Objective:  Lab Results  Component Value Date   CREATININE 0.84 04/03/2020   CREATININE 0.73 09/06/2019   CREATININE 0.74 05/03/2019    Lab Results  Component Value Date   HGBA1C 5.5 04/03/2020       Component Value Date/Time   CHOL 136 09/06/2019 1424   TRIG 113 09/06/2019 1424   HDL 38 (L) 09/06/2019 1424   LDLCALC 77 09/06/2019 1424    Other: (TSH, CBC, Vit D, etc.)  Clinical ASCVD: Yes  The 10-year ASCVD risk score [name], et al., 2013) is: 9.5%   Values used to calculate the score:     Age: 55 years     Sex: Female     Is Non-Hispanic African American: No     Diabetic: No     Tobacco smoker: Yes     Systolic Blood Pressure: 166 mmHg     Is BP treated: Yes     HDL Cholesterol: 38 mg/dL     Total Cholesterol: 136 mg/dL    Other: ([id number] if Afib, PHQ9 if depression, MMRC or CAT for COPD, ACT, DEXA)  BP Readings from Last 3 Encounters:  09/27/20 (!) 166/74  08/05/20 138/72  04/03/20 (!) 150/80    Assessment/Interventions: Review of patient past medical history, allergies, medications, health status, including review of consultants reports, laboratory and other test data, was performed as part of comprehensive evaluation and provision of chronic care management services.   Sleep  Apnea Transportation issues and hasn't done the test. She's also afraid to leave husband alone -3 hours sleep/night Plan: Recommended she try to get the study if possible  Cardio BP: 185/85 175/75 195/95 Lowest BP was 143/70 and that was 2 weeks ago Carvedilol Lisinopril Plan: Let PCP know ASAP  Edema Furosemide take in the afternoon Plan: Take in AM  Lipids Simvastatin 40mg  Plan: Ask PCP to change to Atorvastatin  Vit D deficiency Ergocalciferol 1.25mg /week Plan: At goal,  patient stable/ symptoms controlled   DM Lab Results  Component Value Date   HGBA1C 5.5 04/03/2020   HGBA1C 6.1 (H) 09/06/2019   HGBA1C 5.7 (H) 05/03/2019   Lab Results  Component Value Date   LDLCALC 77 09/06/2019   CREATININE 0.84 04/03/2020    Lab Results  Component Value Date   NA 141 04/03/2020   K 4.2 04/03/2020   CREATININE 0.84 04/03/2020   GFRNONAA 80 04/03/2020   GFRAA 92 04/03/2020   GLUCOSE 128 (H) 04/03/2020   Trulicity 0.75mg  Q Week June 2022: Checks sugar daily: Lowest in 2 weeks was 155 Plan: Will ask PCP to increase dose    Pain -"Hurts all the time" "I sleep in a chair due my back" Pain scale:  10/14/20: 7/10 (Didn't answer with/without meds) Gabapentin  Meloxicam Methocarbamol Quetiapine Plan: Defer to specialist  COPD No flowsheet data found. Dulera Monteleukast Plan: At goal,  patient  stable/ symptoms controlled   GERD Omeprazole 20mg , states only takes PRN Plan: Hasn't been able to get a refill of this   Thyroid Lab Results  Component Value Date   TSH 1.740 04/03/2020   T4TOTAL 9.9 11/30/2014   Levothyroxine 01/30/2015 Plan: At goal,  patient stable/ symptoms controlled   Mental Health Depression screen [facility] 2/9 09/27/2020 05/03/2019 09/30/2018  Decreased Interest 0 3 3  Down, Depressed, Hopeless 0 3 3  PHQ - 2 Score 0 6 6  Altered sleeping 3 3 3   Tired, decreased energy 3 3 3   Change in appetite - 2 0  Feeling bad or failure about yourself  0 2  0  Trouble concentrating 0 3 0  Moving slowly or fidgety/restless 0 3 3  Suicidal thoughts 0 0 0  PHQ-9 Score 6 22 15   Difficult doing work/chores Not difficult at all Somewhat difficult Not difficult at all   Citalopram Quetiapine 25mg  Plan: At goal,  patient stable/ symptoms controlled    SDOH (Social Determinants of Health) assessments and interventions performed:    Care Plan  Allergies  Allergen Reactions   Codeine Shortness Of Breath and Rash   Percocet [Oxycodone-Acetaminophen] Shortness Of Breath   Sulfa Antibiotics Shortness Of Breath and Rash   Aspirin Hives   Bee Venom     Bee stings   Darvon [Propoxyphene]     Darvocet-N 100   Latex    Oxycodone Nausea And Vomiting   Penicillins     Has patient had a PCN reaction causing immediate rash, facial/tongue/throat swelling, SOB or lightheadedness with hypotension: Unknown Has patient had a PCN reaction causing severe rash involving mucus membranes or skin necrosis: Unknown Has patient had a PCN reaction that required hospitalization: Unknown Has patient had a PCN reaction occurring within the last 10 years: Unknown If all of the above answers are "NO", then may proceed with Cephalosporin use.    Medications Reviewed Today     Reviewed by 11/30/2018, [facility] (Pharmacist) on 10/14/20 at 1523  Med List Status: <None>   Medication Order Taking? Sig Documenting Provider Last Dose Status Informant  ACCU-CHEK GUIDE test strip Yes  [provider] Taking Active   Accu-Chek Softclix Lancets lancets Yes SMARTSIG:Topical [provider] Taking Active   albuterol (PROVENTIL) (2.5 MG/3ML) 0.083% nebulizer solution Zettie Pho Yes INHALE CONTENTS OF 1 VIAL IN NEBULIZER EVERY 4 HOURS AS NEEDED [facility], NP Taking Active   carvedilol (COREG) 3.125 MG tablet 10/16/20 Yes Take 1 tablet (3.125 mg total) by mouth 2 (two) times daily with a meal. [id number], DO Taking Active    citalopram (CELEXA) 20 MG tablet [id number] Yes TAKE 1 TABLET BY MOUTH ONCE DAILY [id number], NP Taking Active   doxycycline (VIBRA-TABS) 100 MG tablet [name] No Take 1 tablet (100 mg total) by mouth 2 (two) times daily.  Patient not taking: Reported on 10/14/2020   Caro Laroche, NP Not Taking Consider Medication Status and Discontinue   EPINEPHrine 0.3 mg/0.3 mL IJ SOAJ injection [id number] Yes USE AS DIRECTED. [name], NP Taking Active   furosemide (LASIX) 20 MG tablet [id number] Yes TAKE 1 TABLET BY MOUTH ONCE DAILY IN AFTERNOON AS NEEDED LEG EDEMA 10/16/2020, NP Taking Active   furosemide (LASIX) 40 MG tablet [name] Yes Take 1 tablet (40 mg total) by mouth daily. [id number], PA-C Taking Active            Med Note [name], Trenesha Alcaide K  Mon Oct 14, 2020  1:41 PM) Told to take in AM, not afternoon  gabapentin (NEURONTIN) 300 MG capsule [id number] Yes TAKE 1 CAPSULE BY MOUTH 3 TIMES DAILY ASNEEDED (NEED OFFICE VISIT FOR FURTHER REFILLS) [name], NP Taking Active   levothyroxine (SYNTHROID) 50 MCG tablet [id number] Yes TAKE 1 TABLET BY MOUTH ONCE DAILY ON AN EMPTY STOMACH. WAIT 30 MINUTES BEFORE TAKING OTHER MEDS. [name], NP Taking Active   lisinopril (ZESTRIL) 40 MG tablet [id number] Yes TAKE 1 TABLET BY MOUTH ONCE DAILY [name], NP Taking Active   meloxicam (MOBIC) 15 MG tablet [id number] Yes TAKE 1 TABLET BY MOUTH ONCE DAILY AS NEEDED WITH FOOD OR MILK [name], NP Taking Active   methocarbamol (ROBAXIN) 750 MG tablet [id number] Yes TAKE 1-2 TABLETS BY MOUTH 3 TIMES DAILY AS NEEDED Aura Dials T, NP Taking Active   mometasone-formoterol (DULERA) 100-5 MCG/ACT AERO [id number] Yes INHALE 2 PUFFS BY MOUTH TWICE A DAY. RINSE MOUTH AFTER USE. [name], PA-C Taking Active   montelukast (SINGULAIR) 10 MG tablet [id number] Yes Take 1 tablet (10 mg total) by mouth at bedtime. [name], NP Taking Active    nitroGLYCERIN (NITROSTAT) 0.4 MG SL tablet [id number] Yes DISSOLVE 1 TABLET UNDER TONGUE AT ONSET OF CHEST PAIN. REPEAT IN 5 MIN IF NOT RESOLVED, MAX 3 DOSES. 911 IF NEEDED. [name], NP Taking Active   omeprazole (PRILOSEC) 20 MG capsule [id number]  Take 1 capsule (20 mg total) by mouth daily. Roosvelt Maser [place], [place]  Active   QUEtiapine (SEROQUEL) 25 MG tablet [id number] Yes TAKE 1 TABLET BY MOUTH AT BEDTIME [name], NP Taking Active   simvastatin (ZOCOR) 40 MG tablet [id number] Yes TAKE 1 TABLET BY MOUTH AT BEDTIME [name], NP Taking Active   SPIRIVA HANDIHALER 18 MCG inhalation capsule [id number] Yes INHALE CONTENTS OF 1 CAPSULE ONCE DAILY [name], PA-C Taking Active   traMADol (ULTRAM) 50 MG tablet [id number] No Take 1 tablet (50 mg total) by mouth daily as needed.  Patient not taking: No sig reported   [name], PA-C Not Taking Consider Medication Status and Discontinue   triamcinolone cream (KENALOG) 0.1 % [id number] No APPLY 1 APPLICATION TOPICALLY TWICE DAILY  Patient not taking: Reported on 10/14/2020   Marjie Skiff, NP Not Taking Consider Medication Status and Discontinue   TRULICITY 0.75 MG/0.5ML SOPN [id number] Yes INJECT 0.75MG  SUBQ ONCE A Saintclair Halsted, NP Taking Active   Vitamin D, Ergocalciferol, (DRISDOL) 1.25 MG (50000 UNIT) CAPS capsule [id number] Yes TAKE 1 CAPSULE BY MOUTH ONCE EVERY 7 DAYS Dorcas Carrow, DO Taking Active             Patient Active Problem List   Diagnosis Date Noted   Fibrocystic disease of both breasts 05/07/2019   IFG (impaired fasting glucose) 05/03/2019   Chronic venous insufficiency 11/09/2018   Overactive bladder 09/30/2018   Peripheral neuropathy 09/30/2018   Apnea 01/02/2016   Lymphedema 11/30/2014   Dyspnea on exertion 11/30/2014   Anxiety 11/29/2014   Arthritis 11/29/2014   COPD (chronic obstructive pulmonary disease) (HCC) 11/29/2014   Clinical depression  11/29/2014   H/O eating disorder 11/29/2014   Esophageal reflux 11/29/2014   Acne inversa 11/29/2014   Essential hypertension 11/29/2014   HLD (hyperlipidemia) 11/29/2014   Adult hypothyroidism 11/29/2014   Insomnia 11/29/2014   Low back pain 11/29/2014   Morbid obesity (HCC) 11/29/2014   Posttraumatic stress disorder 11/29/2014   Vitamin D deficiency 11/29/2014   Nicotine  dependence, cigarettes, uncomplicated 11/29/2014    Conditions to be addressed/monitored: HTN, COPD, DM, and Depression  Care Plan : Medication Management  Updates made by Zettie PhoKennedy, Kaveri Perras K, RPH since 10/14/2020 12:00 AM     Problem: Health Promotion or Disease Self-Management (General Plan of Care)      Goal: Medication Management   Note:   Current Barriers:  Does not maintain contact with provider office Does not contact provider office for questions/concerns   Pharmacist Clinical Goal(s):  Over the next 30 days, patient will contact provider office for questions/concerns as evidenced notation of same in electronic health record through collaboration with PharmD and provider.    Interventions: Inter-disciplinary care team collaboration (see longitudinal plan of care) Comprehensive medication review performed; medication list updated in electronic medical record       Patient Goals/Self-Care Activities Over the next 30 days, patient will:  - collaborate with provider on medication access solutions  Follow Up Plan: The patient has been provided with contact information for the care management team and has been advised to call with any health related questions or concerns.      Task: Mutually Develop and Malen GauzeFoster Achievement of Patient Goals   Note:   Care Management Activities:    - verbalization of feelings encouraged    Notes:        Medication Assistance: None required. Patient affirms current coverage meets needs.   Follow up: Agree   Plan: The patient has been provided with  contact information for the care management team and has been advised to call with any health related questions or concerns.   Artelia LarocheNathan Perina Salvaggio, Pharm.D., Managed Medicaid Pharmacist - [phone]

## 2020-10-14 NOTE — Telephone Encounter (Signed)
Can we see if patient can come in sooner for an appointment? She is overdue and there are several medication changes that were recommended by Artelia Laroche whom she spoke with on the phone regarding her medications.

## 2020-10-14 NOTE — Telephone Encounter (Signed)
-----   Message from Zettie Pho, [facility] sent at 10/14/2020  2:16 PM EDT ----- Regarding: Atorvastatin Afternoon! My name is Harrold Donath, I'm the Pharm on the MM team.  I just got off the phone with this lovely woman and had a few questions.  -She's out of Omeprazole refills and Vit D, could you send a new one in? She stated she takes Omeprazole PRN but it's expensive OTC  -Simvastatin 40mg  is medium intensity, she stated she'd be alright with Atorvastatin 40mg , could we change that?  -Her BP is consistently above 180 systolic. Could we give her add on therapy?  -Her Sugars are consistently in high 100's, could we increase the Trulicity?    Thanks so much!

## 2020-10-14 NOTE — Patient Instructions (Signed)
Visit Information  Cheryl Chandler was given information about Medicaid Managed Care team care coordination services as a part of their [facility] Medicaid benefit. Cheryl Chandler verbally consented to engagement with the [facility] Managed Care team.   For questions related to your [facility] health plan, please call: [phone] or go here:[email or web address]  If you would like to schedule transportation through your [facility] plan, please call the following number at least 2 days in advance of your appointment: [phone].  Call the [facility] at [phone], at any time, 24 hours a day, 7 days a week. If you are in danger or need immediate medical attention call 911.  [name] - following are the goals we discussed in your visit today:   Goals Addressed   None     Please see education materials related to DM provided as print materials.   Patient verbalizes understanding of instructions provided today.   The patient has been provided with contact information for the Managed Medicaid care management team and has been advised to call with any health related questions or concerns.   [place] Constable, Pharm.D., Managed Medicaid Pharmacist [phone]   Following is a copy of your plan of care:  Patient Care Plan: General Social Work (Adult)     Problem Identified: Coping Skills (General Plan of Care)      Goal: Coping Skills Enhanced   Start Date: 04/01/2020  This Visit's Progress: On track  Priority: Medium  Note:   Evidence-based guidance:  Acknowledge, normalize and validate difficulty of making life-long lifestyle changes.  Identify current effective and ineffective coping strategies.  Encourage patient and caregiver participation in care to increase self-esteem, confidence and feelings of control.  Consider alternative and complementary therapy approaches such as meditation, mindfulness or yoga.  Encourage  participation in cognitive behavioral therapy to foster a positive identity, increase self-awareness, as well as bolster self-esteem, confidence and self-efficacy.  Discuss spirituality; be present as concerns are identified; encourage journaling, prayer, worship services, meditation or pastoral counseling.  Encourage participation in pleasurable group activities such as hobbies, singing, sports or volunteering).  Encourage the use of mindfulness; refer for training or intensive intervention.  Consider the use of meditative movement therapy such as tai chi, yoga or qigong.  Promote a regular daily exercise program based on tolerance, ability and patient choice to support positive thinking about disease or aging.   Notes:   Current Barriers:  Financial constraints Mental Health Concerns  Social Isolation Limited education about mental health support resources that are available to her within the local area* Cognitive Deficits Lacks knowledge of community resource  Clinical Social Work Clinical Goal(s):  Over the next 90 days, client will work with SW to address concerns related to experiencing ongoing symptoms of depression, sadness and grief since the passing of her mother Over the next 90 days, patient will work with LCSW to address needs related to implementing appropriate self-care and depression management.   Interventions: Patient interviewed and appropriate assessments performed New update- Patient has been out of her insulin for one week. She reports that her insurance denied coverage. She has upcoming office visit with new PCP at [facility] on 04/01/20. Patient confirms stable transportation to this appointment. Provided mental health counseling with regards to patient's loss and ongoing stressors. Patient was very close with her mother and she continues to experience symptoms of grief from this loss. Patient denies wanting LCSW to make a referral for grief counseling through [facility] again  but  was receptive to coping skill and resource education/information that was provided during session today. Patient reports decorating for the holidays helps her to connect with her mother as they did this together when she was alive.  Provided patient with information about managing depression within her daily life. Patient has ongoing grief symptoms as well. Patient states "I feel worn down." Patient reports that she talks out loud to her mother's picture and this is very helpful to her. Grief support resource education provided again during session today.  Provided reflective listening and implemented appropriate interventions to help suppport patient and her emotional needs  Discussed plans with patient for ongoing care management follow up and provided patient with direct contact information for care management team Advised patient to contact CCM team with any urgent case management needs. Assisted patient/caregiver with obtaining information about health plan benefits LCSW completed past referral for family to have a wheelchair ramp built at their residence due to ongoing mobility concerns. Ramp has been successfully installed.  PCS actively involved with patient and providing ongoing services.  Positive reinforcement provided to patient for quitting smoking cigarettes. Coping skill review education provided for future triggers.  Brief self-care education provided to both patient and spouse. Patient confirms that she continues to go to food pantry for food insecurity. Resource information has been provided.  Patient reports ongoing pain and issues with mobility. Patient shares that her pain resides in her pains and legs. Emotional support and coping skill education provided. Patient confirms that her PCS aide continues to provide her with services. However, aide has to be reminded to put on her mask since patient is high risk. LCSW reviewed resources that [name] provided her with in the past.   Patient reports that she had a wonderful birthday spent with her family this past May. Patient reports receiving appropriate socialization. Patient reports that her spouse took her out to eat and gifted her with several things which was a nice surprise.  Patient confirms stable transportation arrangements for upcoming PCP appointment this week.  Patient repots that she is losing a lot of weight but is not sure how much she has lost as she does not have a scale. Patient reports that a goal of hers is to lose weight but she has not been intentionally losing weight at this time.  Patient reports that her aide [name] continues to assist her with PCS and is an excellent support within the home for her and spouse.  Patient planted a tomato plant and successfully grew over 5 tomatoes over the summer. Patient is very proud of this achievement as this was her first time planting a tomato plant and is now interested in picking up gardening/planting as a hobby. 01/22/20-Patient reports recent stress and anxiety around dysfunction within her family. Patient states that she found out that her dad (step father that she refers to as dad) got re-married to a family friend who is in her early 63's. Patient reports that this family friend is someone that she does not wish to have a relationship with. LCSW educated patient on how to put in place healthy boundaries between family members. Update 04/01/20-Patient reports that her aunt continues to be a positive support for her in her life and someone that she can rely on in the case of emergencies. Patient shares that her aunt does not wish to associate with her dad's new wife either.  Patient was educated on healthy self-care skills and advised her to use these into her  daily routine.   Patient Self Care Activities:  Attends all scheduled provider appointments Calls provider office for new concerns or questions  Please see past updates related to this goal by clicking on  the "Past Updates" button in the selected goal       Task: Support Psychosocial Response to Risk or Actual Health Condition   Note:   Care Management Activities:    - active listening utilized - caregiver stress acknowledged - counseling provided - current coping strategies identified - decision-making supported - healthy lifestyle promoted - journaling promoted - meditative movement therapy encouraged - mindfulness encouraged - participation in counseling encouraged - problem-solving facilitated - relaxation techniques promoted - self-reflection promoted - spiritual activities promoted - verbalization of feelings encouraged    Notes:     Patient Care Plan: RNCM: COPD (Adult)     Problem Identified: RNCM: Psychological Adjustment to Diagnosis (COPD)   Priority: Medium     Goal: RNCM: Adjustment to Disease Achieved   Priority: Medium  Note:   Current Barriers:  Knowledge deficits related to basic understanding of COPD disease process Knowledge deficits related to basic COPD self care/management Knowledge deficit related to basic understanding of how to use inhalers and how inhaled medications work Knowledge deficit related to importance of energy conservation Limited Social Support Unable to independently manage COPD Lacks social connections Does not contact provider office for questions/concerns  Case Manager Clinical Goal(s): Over the next 120 days patient will report using inhalers as prescribed including rinsing mouth after use Over the next 120 days, patient will be able to verbalize understanding of COPD action plan and when to seek appropriate levels of medical care Over the next 120 days, patient will engage in lite exercise as tolerated to build/regain stamina and strength and reduce shortness of breath through activity tolerance Over the next 120 days, patient will verbalize basic understanding of COPD disease process and self care activities   Interventions:  Collaboration with [name], NP regarding development and update of comprehensive plan of care as evidenced by provider attestation and co-signature Inter-disciplinary care team collaboration (see longitudinal plan of care) UNABLE to independently: manage COPD Provided patient with basic written and verbal COPD education on self care/management/and exacerbation prevention  Provided patient with COPD action plan and reinforced importance of daily self assessment Provided written and verbal instructions on pursed lip breathing and utilized returned demonstration as teach back Provided instruction about proper use of medications used for management of COPD including inhalers Advised patient to self assesses COPD action plan zone and make appointment with provider if in the yellow zone for 48 hours without improvement. Provided patient with education about the role of exercise in the management of COPD Advised patient to engage in light exercise as tolerated 3-5 days a week Provided education about and advised patient to utilize infection prevention strategies to reduce risk of respiratory infection  Patient Goals/Self-Care Activities:  - decision-making supported - depression screen reviewed - emotional support provided - family involvement promoted - problem-solving facilitated - relaxation techniques promoted - verbalization of feelings encouraged Follow Up Plan: Telephone follow up appointment with care management team member scheduled for: 07-10-2020 at 1:45 pm    Task: RNCM: Support Psychosocial Response to Chronic Obstructive Pulmonary Disease   Note:   Care Management Activities:    - decision-making supported - depression screen reviewed - emotional support provided - family involvement promoted - problem-solving facilitated - relaxation techniques promoted - verbalization of feelings encouraged  Problem Identified: RNCM: Symptom Exacerbation  (COPD)   Priority: High     Goal: RNCM: Smoking Cessation Symptom Exacerbation Prevented or Minimized   Priority: High  Note:   Current Barriers:  Unable to independently stop smoking  Does not adhere to provider recommendations re: smoking cessation  Lacks social connections Does not contact provider office for questions/concerns Tobacco abuse of >40 years; currently smoking 1 ppd per week Previous quit attempts, unsuccessful several successful using patches- has a pattern of stopping and then starting back  Reports smoking within 30 minutes of waking up Reports triggers to smoke include: stress, chronic conditions  Reports motivation to quit smoking includes: knows it will help her breathing and other chronic conditions  On a scale of 1-10, reports MOTIVATION to quit is 8 On a scale of 1-10, reports CONFIDENCE in quitting is 7 Clinical Goal(s):  Over the next 120 days, patient will work with RN Case Engineer, civil (consulting) and provider towards tobacco cessation  Interventions: Collaboration with [name], NP regarding development and update of comprehensive plan of care as evidenced by provider attestation and co-signature Inter-disciplinary care team collaboration (see longitudinal plan of care) Evaluation of current treatment plan reviewed Provided contact information for [place] [facility]uit [facility] (1-800-QUIT-NOW). Patient will outreach this group for support. Discussed plans with patient for ongoing care management follow up and provided patient with direct contact information for care management team Provided patient with printed smoking cessation educational materials Provided contact information for [place] [facility] (1-800-QUIT-NOW). Patient will outreach this group for support. Evaluation of current treatment plan reviewed- currently the patient is cutting back but wants to quit  Patient Goals/Self-Care Activities Over the next 120 days, patient will:  - mindfulness,  talking to family and friends as a habit during cravings  - verbally commit to reducing tobacco consumption - barriers to lifestyle changes reviewed and addressed - barriers to treatment reviewed and addressed - breathing techniques encouraged - modification of home and work environment promoted - rescue (action) plan reviewed - signs/symptoms of infection reviewed - signs/symptoms of worsening disease assessed - treatment plan reviewed Follow Up Plan: Telephone follow up appointment with care management team member scheduled for: 07-10-2020 and 1:45 pm    Task: RNCM: Identify and Minimize Risk of COPD Exacerbation   Note:   Care Management Activities:    - barriers to lifestyle changes reviewed and addressed - barriers to treatment reviewed and addressed - breathing techniques encouraged - modification of home and work environment promoted - rescue (action) plan reviewed - signs/symptoms of infection reviewed - signs/symptoms of worsening disease assessed - treatment plan reviewed        Patient Care Plan: RNCM: Urinary Incontinence (Adult)     Problem Identified: RNCM: Disorder Identification (Urinary Incontinence)   Priority: Medium     Goal: RNCM: Urinary Incontinence Identified   Priority: Medium  Note:   Current Barriers:  Care Coordination needs related to urinary supplies and needs  in a patient with Urinary incontinence and OAB Chronic Disease Management support and education needs related to effective management of urinary incontinence and OAB Lacks caregiver support.  Unable to independently manage OAB and Urinary incontinence  Lacks social connections Does not contact provider office for questions/concerns  Nurse Case Manager Clinical Goal(s):  Over the next 120 days, patient will verbalize understanding of plan for effective management of urinary issues Over the next 120 days, patient will work with [facility] and pcp  to address needs related to urinary  incontinence and OAB Over the next 120 days, patient will demonstrate improved adherence to prescribed treatment plan for OAB and urinary incontinence as evidenced bydecreased episodes of incontinence, no UTI's, and following prescribed plan of care   Interventions:  1:1 collaboration with [name], NP regarding development and update of comprehensive plan of care as evidenced by provider attestation and co-signature Inter-disciplinary care team collaboration (see longitudinal plan of care) Evaluation of current treatment plan related to OAB, urinary incontinence  and patient's adherence to plan as established by provider. Advised patient to call for worsening sx and sx of condition or questions  Provided education to patient re: incontinence products, hygiene, and safety Discussed plans with patient for ongoing care management follow up and provided patient with direct contact information for care management team  Patient Goals/Self-Care Activities Over the next 120 days, patient will:  - Patient will self administer medications as prescribed Patient will attend all scheduled provider appointments Patient will call pharmacy for medication refills Patient will continue to perform IADL's independently Patient will call provider office for new concerns or questions Patient will work with BSW to address care coordination needs and will continue to work with the clinical team to address health care and disease management related needs.   - nature and severity of incontinence assessed - signs of urinary tract infection assessed  Follow Up Plan: Telephone follow up appointment with care management team member scheduled for: 07-10-2020 at 1:45 pm        Task: RNCM: Identify Presence and Type of Urinary Incontinence   Note:   Care Management Activities:    - nature and severity of incontinence assessed - signs of urinary tract infection assessed       Patient Care Plan: RNCM:  Hypertension (Adult)     Problem Identified: RNCM: Hypertension (Hypertension)   Priority: Medium     Goal: RNCM: Hypertension Monitored   Priority: Medium  Note:   Objective:  Last practice recorded BP readings:  BP Readings from Last 3 Encounters:  04/03/20 (!) 150/80  12/06/19 (!) 162/78  09/06/19 130/66   Most recent eGFR/CrCl: No results found for: EGFR  No components found for: CRCL Current Barriers:  Knowledge Deficits related to basic understanding of hypertension pathophysiology and self care management Knowledge Deficits related to understanding of medications prescribed for management of hypertension Limited Social Support Unable to independently manage HTN Lacks social connections Does not contact provider office for questions/concerns Case Manager Clinical Goal(s):  Over the next 120 days, patient will verbalize understanding of plan for hypertension management Over the next 120 days, patient will demonstrate improved adherence to prescribed treatment plan for hypertension as evidenced by taking all medications as prescribed, monitoring and recording blood pressure as directed, adhering to low sodium/DASH diet Over the next 120 days, patient will demonstrate improved health management independence as evidenced by checking blood pressure as directed and notifying PCP if SBP>160 or DBP > 90, taking all medications as prescribe, and adhering to a low sodium diet as discussed. Interventions:  Collaboration with [name], NP regarding development and update of comprehensive plan of care as evidenced by provider attestation and co-signature Inter-disciplinary care team collaboration (see longitudinal plan of care) Evaluation of current treatment plan related to hypertension self management and patient's adherence to plan as established by provider. Provided education to patient re: stroke prevention, s/s of heart attack and stroke, DASH diet, complications of  uncontrolled blood pressure Reviewed medications with patient and discussed importance of compliance Discussed  plans with patient for ongoing care management follow up and provided patient with direct contact information for care management team Advised patient, providing education and rationale, to monitor blood pressure daily and record, calling PCP for findings outside established parameters.  Patient Goals/Self-Care Activities Over the next 120 days, patient will:  - Self administers medications as prescribed Attends all scheduled provider appointments Calls provider office for new concerns, questions, or BP outside discussed parameters Checks BP and records as discussed Follows a low sodium diet/DASH diet - blood pressure trends reviewed - depression screen reviewed - home or ambulatory blood pressure monitoring encouraged Follow Up Plan: Telephone follow up appointment with care management team member scheduled for: 07-10-2020 at 1:45 pm    Task: RNCM: Identify and Monitor Blood Pressure Elevation   Note:   Care Management Activities:    - blood pressure trends reviewed - depression screen reviewed - home or ambulatory blood pressure monitoring encouraged        Patient Care Plan: Medication Management     Problem Identified: Health Promotion or Disease Self-Management (General Plan of Care)      Goal: Medication Management   Note:   Current Barriers:  Does not maintain contact with provider office Does not contact provider office for questions/concerns   Pharmacist Clinical Goal(s):  Over the next 30 days, patient will contact provider office for questions/concerns as evidenced notation of same in electronic health record through collaboration with PharmD and provider.    Interventions: Inter-disciplinary care team collaboration (see longitudinal plan of care) Comprehensive medication review performed; medication list updated in electronic medical  record       Patient Goals/Self-Care Activities Over the next 30 days, patient will:  - collaborate with provider on medication access solutions  Follow Up Plan: The patient has been provided with contact information for the care management team and has been advised to call with any health related questions or concerns.      Task: Mutually Develop and Royce Macadamia Achievement of Patient Goals   Note:   Care Management Activities:    - verbalization of feelings encouraged    Notes:

## 2020-10-15 DIAGNOSIS — R3981 Functional urinary incontinence: Secondary | ICD-10-CM | POA: Diagnosis not present

## 2020-10-15 DIAGNOSIS — Z993 Dependence on wheelchair: Secondary | ICD-10-CM | POA: Diagnosis not present

## 2020-10-15 NOTE — Telephone Encounter (Signed)
Pt declined to make apt stated she would call back to make this apt verbalized understanding .

## 2020-10-25 ENCOUNTER — Other Ambulatory Visit: Payer: Self-pay | Admitting: Family Medicine

## 2020-10-25 ENCOUNTER — Other Ambulatory Visit: Payer: Self-pay | Admitting: Nurse Practitioner

## 2020-10-25 DIAGNOSIS — Z419 Encounter for procedure for purposes other than remedying health state, unspecified: Secondary | ICD-10-CM | POA: Diagnosis not present

## 2020-10-25 DIAGNOSIS — E039 Hypothyroidism, unspecified: Secondary | ICD-10-CM

## 2020-10-25 NOTE — Telephone Encounter (Signed)
Future visit in 5 days  

## 2020-10-25 NOTE — Telephone Encounter (Signed)
Pt is scheduled for  7/6

## 2020-10-25 NOTE — Telephone Encounter (Signed)
   Notes to clinic:  Patient has appt on 10/30/2020 Review for refill    Requested Prescriptions  Pending Prescriptions Disp Refills   Dulaglutide (TRULICITY) 0.75 MG/0.5ML SOPN 6 mL 1      Endocrinology:  Diabetes - GLP-1 Receptor Agonists Failed - 10/25/2020  7:53 AM      Failed - HBA1C is between 0 and 7.9 and within 180 days    HB A1C (BAYER DCA - WAIVED)  Date Value Ref Range Status  04/03/2020 5.5 <7.0 % Final    Comment:                                          Diabetic Adult            <7.0                                       Healthy Adult        4.3 - 5.7                                                           (DCCT/NGSP) American Diabetes Association's Summary of Glycemic Recommendations for Adults with Diabetes: Hemoglobin A1c <7.0%. More stringent glycemic goals (A1c <6.0%) may further reduce complications at the cost of increased risk of hypoglycemia.           Passed - Valid encounter within last 6 months    Recent Outpatient Visits           4 weeks ago Insect bite of right foot, initial encounter   [facility] [name], NP   6 months ago Adult hypothyroidism   [facility] Caro Laroche, DO   10 months ago Lymphedema   [facility] Roosvelt Maser [place], [place]   11 months ago Primary insomnia   [facility] Roosvelt Maser [place], [place]   1 year ago IFG (impaired fasting glucose)   [facility], Salley Hews, [place]       Future Appointments             In 5 days [name], NP [facility]

## 2020-10-25 NOTE — Telephone Encounter (Signed)
Copied from CRM [id number]. Topic: Quick Communication - Rx Refill/Question >> Oct 25, 2020  7:47 AM Jaquita Rector A wrote: Medication: TRULICITY 0.75 MG/0.5ML SOPN  Per patient did not have for yesterdays dose  Has the patient contacted their pharmacy? Yes.   (Agent: If no, request that the patient contact the pharmacy for the refill.) (Agent: If yes, when and what did the pharmacy advise?)  Preferred Pharmacy (with phone number or street name): [facility] - [place], [place] - [address]  Phone:  [phone] Fax:  [phone]     Agent: Please be advised that RX refills may take up to 3 business days. We ask that you follow-up with your pharmacy.

## 2020-10-25 NOTE — Telephone Encounter (Signed)
Future visit in 5 days. Medications due 11/11/20 and 12/21/20

## 2020-10-25 NOTE — Telephone Encounter (Signed)
Can we see if she has enough to last to next week?

## 2020-10-29 ENCOUNTER — Telehealth: Payer: Self-pay

## 2020-10-29 NOTE — Telephone Encounter (Signed)
Incoming fax via cover my meds for quetiapine. Tried to submit PA, but was rejected due to the previous denial of the medication.

## 2020-10-30 ENCOUNTER — Ambulatory Visit: Payer: Self-pay

## 2020-10-30 ENCOUNTER — Encounter: Payer: Self-pay | Admitting: Nurse Practitioner

## 2020-10-30 ENCOUNTER — Ambulatory Visit (INDEPENDENT_AMBULATORY_CARE_PROVIDER_SITE_OTHER): Payer: Medicaid Other | Admitting: Nurse Practitioner

## 2020-10-30 ENCOUNTER — Other Ambulatory Visit: Payer: Self-pay

## 2020-10-30 VITALS — BP 126/67 | HR 69 | Temp 98.7°F

## 2020-10-30 DIAGNOSIS — E039 Hypothyroidism, unspecified: Secondary | ICD-10-CM | POA: Diagnosis not present

## 2020-10-30 DIAGNOSIS — J301 Allergic rhinitis due to pollen: Secondary | ICD-10-CM | POA: Diagnosis not present

## 2020-10-30 DIAGNOSIS — F419 Anxiety disorder, unspecified: Secondary | ICD-10-CM | POA: Diagnosis not present

## 2020-10-30 DIAGNOSIS — I10 Essential (primary) hypertension: Secondary | ICD-10-CM | POA: Diagnosis not present

## 2020-10-30 DIAGNOSIS — R7301 Impaired fasting glucose: Secondary | ICD-10-CM

## 2020-10-30 DIAGNOSIS — E785 Hyperlipidemia, unspecified: Secondary | ICD-10-CM

## 2020-10-30 DIAGNOSIS — J449 Chronic obstructive pulmonary disease, unspecified: Secondary | ICD-10-CM

## 2020-10-30 DIAGNOSIS — E559 Vitamin D deficiency, unspecified: Secondary | ICD-10-CM

## 2020-10-30 DIAGNOSIS — F331 Major depressive disorder, recurrent, moderate: Secondary | ICD-10-CM

## 2020-10-30 MED ORDER — OMEPRAZOLE 20 MG PO CPDR: 20.0000 mg | DELAYED_RELEASE_CAPSULE | Freq: Every day | ORAL | 12 refills | Status: DC

## 2020-10-30 MED ORDER — LEVOTHYROXINE SODIUM 50 MCG PO TABS: ORAL_TABLET | ORAL | 1 refills | Status: DC

## 2020-10-30 MED ORDER — AMLODIPINE BESYLATE 5 MG PO TABS: 5.0000 mg | ORAL_TABLET | Freq: Every day | ORAL | 1 refills | Status: DC

## 2020-10-30 MED ORDER — MONTELUKAST SODIUM 10 MG PO TABS: 10.0000 mg | ORAL_TABLET | Freq: Every day | ORAL | 1 refills | Status: DC

## 2020-10-30 MED ORDER — VITAMIN D (ERGOCALCIFEROL) 1.25 MG (50000 UNIT) PO CAPS: ORAL_CAPSULE | ORAL | 1 refills | Status: DC

## 2020-10-30 MED ORDER — ATORVASTATIN CALCIUM 80 MG PO TABS: 80.0000 mg | ORAL_TABLET | Freq: Every day | ORAL | 3 refills | Status: DC

## 2020-10-30 NOTE — Assessment & Plan Note (Signed)
Chronic.  Continues to be elevated in the 190s/90s at home. Will add amlodipine daily. Side effects and benefits discussed with patient during visit today.  Follow up in 1 month for reevaluation. Call sooner if concerns arise.

## 2020-10-30 NOTE — Assessment & Plan Note (Signed)
Chronic.  Controlled.  Continue with current medication regimen.  Labs ordered today.  Return to clinic in 6 months for reevaluation.  Call sooner if concerns arise.  ? ?

## 2020-10-30 NOTE — Assessment & Plan Note (Signed)
Chronic.  Controlled.  Continue with current medication regimen.  Labs ordered today.  Will increase Trulicity if necessary based on lab results.  Return to clinic in 6 months for reevaluation.  Call sooner if concerns arise.

## 2020-10-30 NOTE — Assessment & Plan Note (Signed)
Labs ordered today.  Refills sent. Will make further recommendations based on lab results.

## 2020-10-30 NOTE — Progress Notes (Signed)
BP 126/67   Pulse 69   Temp 98.7 F (37.1 C)   LMP 06/09/2017 (Approximate)   SpO2 96%    Subjective:    Patient ID: Cheryl Chandler, female    DOB: 1965-10-06, 55 y.o.   MRN: [id number]  HPI: Cheryl Chandler is a 55 y.o. female  Chief Complaint  Patient presents with   Medication Refill    Singulair   HYPERTENSION / HYPERLIPIDEMIA Satisfied with current treatment? yes Duration of hypertension: years BP monitoring frequency: daily BP range: 195/90 BP medication side effects: no Past BP meds:  lasix and lisinopril Duration of hyperlipidemia: years Cholesterol medication side effects: no Cholesterol supplements: none Past cholesterol medications: simvastatin (zocor) Medication compliance: excellent compliance Aspirin: no Recent stressors: no Recurrent headaches: no Visual changes: no Palpitations: no Dyspnea: yes Chest pain: no Lower extremity edema: yes Dizzy/lightheaded: no  COPD COPD status: controlled Satisfied with current treatment?: yes Oxygen use: no Dyspnea frequency: yes Cough frequency: daily Rescue inhaler frequency: every 8 hours because that is how its ordered Limitation of activity: yes Productive cough:  Last Spirometry:  Pneumovax: Up to Date Influenza: Up to Date  HYPOTHYROIDISM Thyroid control status:controlled Satisfied with current treatment? yes Medication side effects: no Medication compliance: excellent compliance Etiology of hypothyroidism:  Recent dose adjustment:no Fatigue: yes Cold intolerance: no Heat intolerance: no Weight gain: no Weight loss: no Constipation: no Diarrhea/loose stools: no Palpitations: no Lower extremity edema: yes Anxiety/depressed mood: no  DEPRESSION/ANXIETY Patient states she feels like her mood is doing okay.  She does have days when she is sad from losing people in her life.  Denies SI.  Relevant past medical, surgical, family and social history reviewed and updated as  indicated. Interim medical history since our last visit reviewed. Allergies and medications reviewed and updated.  Review of Systems  Constitutional:  Negative for fever and unexpected weight change.  Eyes:  Negative for visual disturbance.  Respiratory:  Positive for cough and shortness of breath. Negative for chest tightness.   Cardiovascular:  Positive for leg swelling. Negative for chest pain and palpitations.  Endocrine: Negative for cold intolerance.  Neurological:  Negative for dizziness and headaches.  Psychiatric/Behavioral:  Positive for dysphoric mood. Negative for suicidal ideas.    Per HPI unless specifically indicated above     Objective:    BP 126/67   Pulse 69   Temp 98.7 F (37.1 C)   LMP 06/09/2017 (Approximate)   SpO2 96%   Wt Readings from Last 3 Encounters:  08/05/20 300 lb (136.1 kg)  12/06/19 (!) 316 lb (143.3 kg)  06/30/19 (!) 329 lb (149.2 kg)    Physical Exam Vitals and nursing note reviewed.  Constitutional:      General: She is not in acute distress.    Appearance: Normal appearance. She is normal weight. She is not ill-appearing, toxic-appearing or diaphoretic.  HENT:     Head: Normocephalic.     Right Ear: External ear normal.     Left Ear: External ear normal.     Nose: Nose normal.     Mouth/Throat:     Mouth: Mucous membranes are moist.     Pharynx: Oropharynx is clear.  Eyes:     General:        Right eye: No discharge.        Left eye: No discharge.     Extraocular Movements: Extraocular movements intact.     Conjunctiva/sclera: Conjunctivae normal.     Pupils: Pupils are  equal, round, and reactive to light.  Cardiovascular:     Rate and Rhythm: Normal rate and regular rhythm.     Heart sounds: No murmur heard. Pulmonary:     Effort: Pulmonary effort is normal. No respiratory distress.     Breath sounds: Decreased breath sounds present. No wheezing or rales.  Musculoskeletal:     Cervical back: Normal range of motion and neck  supple.     Right lower leg: Edema present.     Left lower leg: Edema present.  Skin:    General: Skin is warm and dry.     Capillary Refill: Capillary refill takes less than 2 seconds.  Neurological:     General: No focal deficit present.     Mental Status: She is alert and oriented to person, place, and time. Mental status is at baseline.  Psychiatric:        Mood and Affect: Mood normal.        Behavior: Behavior normal.        Thought Content: Thought content normal.        Judgment: Judgment normal.    Results for orders placed or performed in visit on 04/03/20  Fecal occult blood, imunochemical   Specimen: Stool   ST  Result Value Ref Range   Fecal Occult Bld Negative Negative  Bayer DCA Hb A1c Waived (STAT)  Result Value Ref Range   HB A1C (BAYER DCA - WAIVED) 5.5 <7.0 %  TSH  Result Value Ref Range   TSH 1.740 0.450 - 4.500 uIU/mL  Basic Metabolic Panel (BMET)  Result Value Ref Range   Glucose 128 (H) 65 - 99 mg/dL   BUN 18 6 - 24 mg/dL   Creatinine, Ser 0.84 0.57 - 1.00 mg/dL   GFR calc non Af Amer 80 >59 mL/min/1.73   GFR calc Af Amer 92 >59 mL/min/1.73   BUN/Creatinine Ratio 21 9 - 23   Sodium 141 134 - 144 mmol/L   Potassium 4.2 3.5 - 5.2 mmol/L   Chloride 102 96 - 106 mmol/L   CO2 28 20 - 29 mmol/L   Calcium 8.8 8.7 - 10.2 mg/dL  Hepatitis C antibody  Result Value Ref Range   Hep C Virus Ab <0.1 0.0 - 0.9 s/co ratio  HIV antibody (with reflex)  Result Value Ref Range   HIV Screen 4th Generation wRfx Non Reactive Non Reactive      Assessment & Plan:   Problem List Items Addressed This Visit       Cardiovascular and Mediastinum   Essential hypertension    Chronic.  Continues to be elevated in the 190s/90s at home. Will add amlodipine daily. Side effects and benefits discussed with patient during visit today.  Follow up in 1 month for reevaluation. Call sooner if concerns arise.        Relevant Medications   atorvastatin (LIPITOR) 80 MG tablet    amLODipine (NORVASC) 5 MG tablet   Other Relevant Orders   Comp Met (CMET)   Lipid Profile   HgB A1c     Respiratory   COPD (chronic obstructive pulmonary disease) (HCC) - Primary    Chronic. Not well controlled.  Will consider spirometry in the future. Discussed proper use of albuterol versus other inhalers. Follow up in 3 months.       Relevant Medications   montelukast (SINGULAIR) 10 MG tablet     Endocrine   Adult hypothyroidism    Chronic.  Controlled.  Continue with current  medication regimen.  Labs ordered today.  Return to clinic in 6 months for reevaluation.  Call sooner if concerns arise.         Relevant Medications   levothyroxine (SYNTHROID) 50 MCG tablet   Other Relevant Orders   TSH   T4, free   IFG (impaired fasting glucose)    Chronic.  Controlled.  Continue with current medication regimen.  Labs ordered today.  Will increase Trulicity if necessary based on lab results.  Return to clinic in 6 months for reevaluation.  Call sooner if concerns arise.         Relevant Orders   Comp Met (CMET)   Lipid Profile   HgB A1c     Other   Anxiety    Chronic.  Controlled.  Continue with current medication regimen.  Labs ordered today.  Return to clinic in 6 months for reevaluation.  Call sooner if concerns arise.         Clinical depression    Chronic.  Controlled.  Continue with current medication regimen.  Labs ordered today.  Return to clinic in 6 months for reevaluation.  Call sooner if concerns arise.         HLD (hyperlipidemia)    Chronic.  Controlled.  Continue with current medication regimen.  Labs ordered today.  Return to clinic in 6 months for reevaluation.  Call sooner if concerns arise.         Relevant Medications   atorvastatin (LIPITOR) 80 MG tablet   amLODipine (NORVASC) 5 MG tablet   Other Relevant Orders   Comp Met (CMET)   Lipid Profile   HgB A1c   Vitamin D deficiency    Labs ordered today.  Refills sent. Will make further  recommendations based on lab results.        Relevant Orders   Vitamin D (25 hydroxy)   Other Visit Diagnoses     Chronic allergic rhinitis due to pollen       Relevant Medications   montelukast (SINGULAIR) 10 MG tablet        Follow up plan: Return in about 1 month (around 11/30/2020) for BP Check.

## 2020-10-30 NOTE — Assessment & Plan Note (Signed)
Chronic. Not well controlled.  Will consider spirometry in the future. Discussed proper use of albuterol versus other inhalers. Follow up in 3 months.

## 2020-10-31 LAB — COMPREHENSIVE METABOLIC PANEL
ALT: 17 IU/L (ref 0–32)
AST: 11 IU/L (ref 0–40)
Albumin/Globulin Ratio: 1.5 (ref 1.2–2.2)
Albumin: 3.9 g/dL (ref 3.8–4.9)
Alkaline Phosphatase: 77 IU/L (ref 44–121)
BUN/Creatinine Ratio: 20 (ref 9–23)
BUN: 12 mg/dL (ref 6–24)
Bilirubin Total: 0.3 mg/dL (ref 0.0–1.2)
CO2: 24 mmol/L (ref 20–29)
Calcium: 8.8 mg/dL (ref 8.7–10.2)
Chloride: 100 mmol/L (ref 96–106)
Creatinine, Ser: 0.6 mg/dL (ref 0.57–1.00)
Globulin, Total: 2.6 g/dL (ref 1.5–4.5)
Glucose: 105 mg/dL — ABNORMAL HIGH (ref 65–99)
Potassium: 4.5 mmol/L (ref 3.5–5.2)
Sodium: 140 mmol/L (ref 134–144)
Total Protein: 6.5 g/dL (ref 6.0–8.5)
eGFR: 107 mL/min/{1.73_m2} (ref >59)

## 2020-10-31 LAB — TSH: TSH: 1.67 u[IU]/mL (ref 0.450–4.500)

## 2020-10-31 LAB — LIPID PANEL
Chol/HDL Ratio: 4.5 ratio — ABNORMAL HIGH (ref 0.0–4.4)
Cholesterol, Total: 156 mg/dL (ref 100–199)
HDL: 35 mg/dL — ABNORMAL LOW (ref >39)
LDL Chol Calc (NIH): 91 mg/dL (ref 0–99)
Triglycerides: 175 mg/dL — ABNORMAL HIGH (ref 0–149)
VLDL Cholesterol Cal: 30 mg/dL (ref 5–40)

## 2020-10-31 LAB — HEMOGLOBIN A1C
Est. average glucose Bld gHb Est-mCnc: 123 mg/dL
Hgb A1c MFr Bld: 5.9 % — ABNORMAL HIGH (ref 4.8–5.6)

## 2020-10-31 LAB — VITAMIN D 25 HYDROXY (VIT D DEFICIENCY, FRACTURES): Vit D, 25-Hydroxy: 16.7 ng/mL — ABNORMAL LOW (ref 30.0–100.0)

## 2020-10-31 LAB — T4, FREE: Free T4: 1.12 ng/dL (ref 0.82–1.77)

## 2020-10-31 NOTE — Progress Notes (Signed)
Please let patient know that overall her lab work looks good. Her triglycerides were elevated but that is likely because she wasn't fasting.  We will recheck this in the future when she is fasting. Her A1c remains well controlled at 5.9.  We do not need to increase her Trulicity at this time.  Vitamin D is low but that is likely because she has been out of the medication.  We will continue to monitor these in the future. Please let me know if she has any questions.

## 2020-11-04 ENCOUNTER — Other Ambulatory Visit: Payer: Self-pay

## 2020-11-04 NOTE — Patient Instructions (Signed)
Visit Information  Cheryl Chandler was given information about Medicaid Managed Care team care coordination services as a part of their [facility] Medicaid benefit. Cheryl Chandler wishes to consider whether to engagement with the [facility] Managed Care team.   For questions related to your [facility] health plan, please call: [phone] or go here:[email or web address]  If you would like to schedule transportation through your [facility] plan, please call the following number at least 2 days in advance of your appointment: [phone].  Call the [place] at [phone], at any time, 24 hours a day, 7 days a week. If you are in danger or need immediate medical attention call 911.  If you would like help to quit smoking, call 1-800-QUIT-NOW 680-426-0924) OR Espaol: 1-855-Djelo-Ya (6-387-564-3329) o para ms informacin haga clic aqu or Text READY to 200-400 to register via text  Cheryl Chandler - following are the goals we discussed in your visit today:   Goals Addressed   None     Please see education materials related to DM provided as print materials.   Patient verbalizes understanding of instructions provided today.   The patient has been provided with contact information for the Managed Medicaid care management team and has been advised to call with any health related questions or concerns.   [place] Constable, Pharm.D., Managed Medicaid Pharmacist [phone]   Following is a copy of your plan of care:  Patient Care Plan: General Social Work (Adult)     Problem Identified: Coping Skills (General Plan of Care)      Goal: Coping Skills Enhanced   Start Date: 04/01/2020  This Visit's Progress: On track  Priority: Medium  Note:   Evidence-based guidance:  Acknowledge, normalize and validate difficulty of making life-long lifestyle changes.  Identify current effective and ineffective coping strategies.  Encourage patient and  caregiver participation in care to increase self-esteem, confidence and feelings of control.  Consider alternative and complementary therapy approaches such as meditation, mindfulness or yoga.  Encourage participation in cognitive behavioral therapy to foster a positive identity, increase self-awareness, as well as bolster self-esteem, confidence and self-efficacy.  Discuss spirituality; be present as concerns are identified; encourage journaling, prayer, worship services, meditation or pastoral counseling.  Encourage participation in pleasurable group activities such as hobbies, singing, sports or volunteering).  Encourage the use of mindfulness; refer for training or intensive intervention.  Consider the use of meditative movement therapy such as tai chi, yoga or qigong.  Promote a regular daily exercise program based on tolerance, ability and patient choice to support positive thinking about disease or aging.   Notes:   Current Barriers:  Financial constraints Mental Health Concerns  Social Isolation Limited education about mental health support resources that are available to her within the local area* Cognitive Deficits Lacks knowledge of community resource  Clinical Social Work Clinical Goal(s):  Over the next 90 days, client will work with SW to address concerns related to experiencing ongoing symptoms of depression, sadness and grief since the passing of her mother Over the next 90 days, patient will work with LCSW to address needs related to implementing appropriate self-care and depression management.   Interventions: Patient interviewed and appropriate assessments performed New update- Patient has been out of her insulin for one week. She reports that her insurance denied coverage. She has upcoming office visit with new PCP at [facility] on 04/01/20. Patient confirms stable transportation to this appointment. Provided mental health counseling with regards to patient's loss and ongoing  stressors. Patient was very close with her mother and she continues to experience symptoms of grief from this loss. Patient denies wanting LCSW to make a referral for grief counseling through [facility] again but was receptive to coping skill and resource education/information that was provided during session today. Patient reports decorating for the holidays helps her to connect with her mother as they did this together when she was alive.  Provided patient with information about managing depression within her daily life. Patient has ongoing grief symptoms as well. Patient states "I feel worn down." Patient reports that she talks out loud to her mother's picture and this is very helpful to her. Grief support resource education provided again during session today.  Provided reflective listening and implemented appropriate interventions to help suppport patient and her emotional needs  Discussed plans with patient for ongoing care management follow up and provided patient with direct contact information for care management team Advised patient to contact CCM team with any urgent case management needs. Assisted patient/caregiver with obtaining information about health plan benefits LCSW completed past referral for family to have a wheelchair ramp built at their residence due to ongoing mobility concerns. Ramp has been successfully installed.  PCS actively involved with patient and providing ongoing services.  Positive reinforcement provided to patient for quitting smoking cigarettes. Coping skill review education provided for future triggers.  Brief self-care education provided to both patient and spouse. Patient confirms that she continues to go to food pantry for food insecurity. Resource information has been provided.  Patient reports ongoing pain and issues with mobility. Patient shares that her pain resides in her pains and legs. Emotional support and coping skill education provided. Patient confirms  that her PCS aide continues to provide her with services. However, aide has to be reminded to put on her mask since patient is high risk. LCSW reviewed resources that [name] provided her with in the past.  Patient reports that she had a wonderful birthday spent with her family this past May. Patient reports receiving appropriate socialization. Patient reports that her spouse took her out to eat and gifted her with several things which was a nice surprise.  Patient confirms stable transportation arrangements for upcoming PCP appointment this week.  Patient repots that she is losing a lot of weight but is not sure how much she has lost as she does not have a scale. Patient reports that a goal of hers is to lose weight but she has not been intentionally losing weight at this time.  Patient reports that her aide [name] continues to assist her with PCS and is an excellent support within the home for her and spouse.  Patient planted a tomato plant and successfully grew over 5 tomatoes over the summer. Patient is very proud of this achievement as this was her first time planting a tomato plant and is now interested in picking up gardening/planting as a hobby. 01/22/20-Patient reports recent stress and anxiety around dysfunction within her family. Patient states that she found out that her dad (step father that she refers to as dad) got re-married to a family friend who is in her early 64's. Patient reports that this family friend is someone that she does not wish to have a relationship with. LCSW educated patient on how to put in place healthy boundaries between family members. Update 04/01/20-Patient reports that her aunt continues to be a positive support for her in her life and someone that she can rely on in the case  of emergencies. Patient shares that her aunt does not wish to associate with her dad's new wife either.  Patient was educated on healthy self-care skills and advised her to use these into her  daily routine.   Patient Self Care Activities:  Attends all scheduled provider appointments Calls provider office for new concerns or questions  Please see past updates related to this goal by clicking on the "Past Updates" button in the selected goal       Task: Support Psychosocial Response to Risk or Actual Health Condition   Note:   Care Management Activities:    - active listening utilized - caregiver stress acknowledged - counseling provided - current coping strategies identified - decision-making supported - healthy lifestyle promoted - journaling promoted - meditative movement therapy encouraged - mindfulness encouraged - participation in counseling encouraged - problem-solving facilitated - relaxation techniques promoted - self-reflection promoted - spiritual activities promoted - verbalization of feelings encouraged    Notes:     Patient Care Plan: RNCM: COPD (Adult)     Problem Identified: RNCM: Psychological Adjustment to Diagnosis (COPD)   Priority: Medium     Goal: RNCM: Adjustment to Disease Achieved   Priority: Medium  Note:   Current Barriers:  Knowledge deficits related to basic understanding of COPD disease process Knowledge deficits related to basic COPD self care/management Knowledge deficit related to basic understanding of how to use inhalers and how inhaled medications work Knowledge deficit related to importance of energy conservation Limited Social Support Unable to independently manage COPD Lacks social connections Does not contact provider office for questions/concerns  Case Manager Clinical Goal(s): Over the next 120 days patient will report using inhalers as prescribed including rinsing mouth after use Over the next 120 days, patient will be able to verbalize understanding of COPD action plan and when to seek appropriate levels of medical care Over the next 120 days, patient will engage in lite exercise as tolerated to build/regain  stamina and strength and reduce shortness of breath through activity tolerance Over the next 120 days, patient will verbalize basic understanding of COPD disease process and self care activities  Interventions:  Collaboration with [name], NP regarding development and update of comprehensive plan of care as evidenced by provider attestation and co-signature Inter-disciplinary care team collaboration (see longitudinal plan of care) UNABLE to independently: manage COPD Provided patient with basic written and verbal COPD education on self care/management/and exacerbation prevention  Provided patient with COPD action plan and reinforced importance of daily self assessment Provided written and verbal instructions on pursed lip breathing and utilized returned demonstration as teach back Provided instruction about proper use of medications used for management of COPD including inhalers Advised patient to self assesses COPD action plan zone and make appointment with provider if in the yellow zone for 48 hours without improvement. Provided patient with education about the role of exercise in the management of COPD Advised patient to engage in light exercise as tolerated 3-5 days a week Provided education about and advised patient to utilize infection prevention strategies to reduce risk of respiratory infection  Patient Goals/Self-Care Activities:  - decision-making supported - depression screen reviewed - emotional support provided - family involvement promoted - problem-solving facilitated - relaxation techniques promoted - verbalization of feelings encouraged Follow Up Plan: Telephone follow up appointment with care management team member scheduled for: 07-10-2020 at 1:45 pm    Task: RNCM: Support Psychosocial Response to Chronic Obstructive Pulmonary Disease   Note:   Care Management Activities:    -  decision-making supported - depression screen reviewed - emotional support  provided - family involvement promoted - problem-solving facilitated - relaxation techniques promoted - verbalization of feelings encouraged      Problem Identified: RNCM: Symptom Exacerbation (COPD)   Priority: High     Goal: RNCM: Smoking Cessation Symptom Exacerbation Prevented or Minimized   Priority: High  Note:   Current Barriers:  Unable to independently stop smoking  Does not adhere to provider recommendations re: smoking cessation  Lacks social connections Does not contact provider office for questions/concerns Tobacco abuse of >40 years; currently smoking 1 ppd per week Previous quit attempts, unsuccessful several successful using patches- has a pattern of stopping and then starting back  Reports smoking within 30 minutes of waking up Reports triggers to smoke include: stress, chronic conditions  Reports motivation to quit smoking includes: knows it will help her breathing and other chronic conditions  On a scale of 1-10, reports MOTIVATION to quit is 8 On a scale of 1-10, reports CONFIDENCE in quitting is 7 Clinical Goal(s):  Over the next 120 days, patient will work with RN Case Engineer, civil (consulting) and provider towards tobacco cessation  Interventions: Collaboration with [name], NP regarding development and update of comprehensive plan of care as evidenced by provider attestation and co-signature Inter-disciplinary care team collaboration (see longitudinal plan of care) Evaluation of current treatment plan reviewed Provided contact information for [place] [facility] (1-800-QUIT-NOW). Patient will outreach this group for support. Discussed plans with patient for ongoing care management follow up and provided patient with direct contact information for care management team Provided patient with printed smoking cessation educational materials Provided contact information for [place] [facility] (1-800-QUIT-NOW). Patient will outreach this group for  support. Evaluation of current treatment plan reviewed- currently the patient is cutting back but wants to quit  Patient Goals/Self-Care Activities Over the next 120 days, patient will:  - mindfulness, talking to family and friends as a habit during cravings  - verbally commit to reducing tobacco consumption - barriers to lifestyle changes reviewed and addressed - barriers to treatment reviewed and addressed - breathing techniques encouraged - modification of home and work environment promoted - rescue (action) plan reviewed - signs/symptoms of infection reviewed - signs/symptoms of worsening disease assessed - treatment plan reviewed Follow Up Plan: Telephone follow up appointment with care management team member scheduled for: 07-10-2020 and 1:45 pm    Task: RNCM: Identify and Minimize Risk of COPD Exacerbation   Note:   Care Management Activities:    - barriers to lifestyle changes reviewed and addressed - barriers to treatment reviewed and addressed - breathing techniques encouraged - modification of home and work environment promoted - rescue (action) plan reviewed - signs/symptoms of infection reviewed - signs/symptoms of worsening disease assessed - treatment plan reviewed        Patient Care Plan: RNCM: Urinary Incontinence (Adult)     Problem Identified: RNCM: Disorder Identification (Urinary Incontinence)   Priority: Medium     Goal: RNCM: Urinary Incontinence Identified   Priority: Medium  Note:   Current Barriers:  Care Coordination needs related to urinary supplies and needs  in a patient with Urinary incontinence and OAB Chronic Disease Management support and education needs related to effective management of urinary incontinence and OAB Lacks caregiver support.  Unable to independently manage OAB and Urinary incontinence  Lacks social connections Does not contact provider office for questions/concerns  Nurse Case Manager Clinical Goal(s):  Over the  next 120  days, patient will verbalize understanding of plan for effective management of urinary issues Over the next 120 days, patient will work with [facility]) and pcp  to address needs related to urinary incontinence and OAB Over the next 120 days, patient will demonstrate improved adherence to prescribed treatment plan for OAB and urinary incontinence as evidenced bydecreased episodes of incontinence, no UTI's, and following prescribed plan of care   Interventions:  1:1 collaboration with [name], NP regarding development and update of comprehensive plan of care as evidenced by provider attestation and co-signature Inter-disciplinary care team collaboration (see longitudinal plan of care) Evaluation of current treatment plan related to OAB, urinary incontinence  and patient's adherence to plan as established by provider. Advised patient to call for worsening sx and sx of condition or questions  Provided education to patient re: incontinence products, hygiene, and safety Discussed plans with patient for ongoing care management follow up and provided patient with direct contact information for care management team  Patient Goals/Self-Care Activities Over the next 120 days, patient will:  - Patient will self administer medications as prescribed Patient will attend all scheduled provider appointments Patient will call pharmacy for medication refills Patient will continue to perform IADL's independently Patient will call provider office for new concerns or questions Patient will work with BSW to address care coordination needs and will continue to work with the clinical team to address health care and disease management related needs.   - nature and severity of incontinence assessed - signs of urinary tract infection assessed  Follow Up Plan: Telephone follow up appointment with care management team member scheduled for: 07-10-2020 at 1:45 pm        Task: RNCM: Identify Presence and  Type of Urinary Incontinence   Note:   Care Management Activities:    - nature and severity of incontinence assessed - signs of urinary tract infection assessed       Patient Care Plan: RNCM: Hypertension (Adult)     Problem Identified: RNCM: Hypertension (Hypertension)   Priority: Medium     Goal: RNCM: Hypertension Monitored   Priority: Medium  Note:   Objective:  Last practice recorded BP readings:  BP Readings from Last 3 Encounters:  04/03/20 (!) 150/80  12/06/19 (!) 162/78  09/06/19 130/66   Most recent eGFR/CrCl: No results found for: EGFR  No components found for: CRCL Current Barriers:  Knowledge Deficits related to basic understanding of hypertension pathophysiology and self care management Knowledge Deficits related to understanding of medications prescribed for management of hypertension Limited Social Support Unable to independently manage HTN Lacks social connections Does not contact provider office for questions/concerns Case Manager Clinical Goal(s):  Over the next 120 days, patient will verbalize understanding of plan for hypertension management Over the next 120 days, patient will demonstrate improved adherence to prescribed treatment plan for hypertension as evidenced by taking all medications as prescribed, monitoring and recording blood pressure as directed, adhering to low sodium/DASH diet Over the next 120 days, patient will demonstrate improved health management independence as evidenced by checking blood pressure as directed and notifying PCP if SBP>160 or DBP > 90, taking all medications as prescribe, and adhering to a low sodium diet as discussed. Interventions:  Collaboration with [name], NP regarding development and update of comprehensive plan of care as evidenced by provider attestation and co-signature Inter-disciplinary care team collaboration (see longitudinal plan of care) Evaluation of current treatment plan related to  hypertension self management and patient's adherence to plan as established  by provider. Provided education to patient re: stroke prevention, s/s of heart attack and stroke, DASH diet, complications of uncontrolled blood pressure Reviewed medications with patient and discussed importance of compliance Discussed plans with patient for ongoing care management follow up and provided patient with direct contact information for care management team Advised patient, providing education and rationale, to monitor blood pressure daily and record, calling PCP for findings outside established parameters.  Patient Goals/Self-Care Activities Over the next 120 days, patient will:  - Self administers medications as prescribed Attends all scheduled provider appointments Calls provider office for new concerns, questions, or BP outside discussed parameters Checks BP and records as discussed Follows a low sodium diet/DASH diet - blood pressure trends reviewed - depression screen reviewed - home or ambulatory blood pressure monitoring encouraged Follow Up Plan: Telephone follow up appointment with care management team member scheduled for: 07-10-2020 at 1:45 pm    Task: RNCM: Identify and Monitor Blood Pressure Elevation   Note:   Care Management Activities:    - blood pressure trends reviewed - depression screen reviewed - home or ambulatory blood pressure monitoring encouraged        Patient Care Plan: Medication Management     Problem Identified: Health Promotion or Disease Self-Management (General Plan of Care)      Goal: Medication Management   Note:   Current Barriers:  Does not maintain contact with provider office Does not contact provider office for questions/concerns   Pharmacist Clinical Goal(s):  Over the next 30 days, patient will contact provider office for questions/concerns as evidenced notation of same in electronic health record through collaboration with PharmD and provider.     Interventions: Inter-disciplinary care team collaboration (see longitudinal plan of care) Comprehensive medication review performed; medication list updated in electronic medical record       Patient Goals/Self-Care Activities Over the next 30 days, patient will:  - collaborate with provider on medication access solutions  Follow Up Plan: The patient has been provided with contact information for the care management team and has been advised to call with any health related questions or concerns.      Task: Mutually Develop and Royce Macadamia Achievement of Patient Goals   Note:   Care Management Activities:    - verbalization of feelings encouraged    Notes:
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
## 2020-11-06 ENCOUNTER — Ambulatory Visit: Payer: Self-pay | Admitting: *Deleted

## 2020-11-06 NOTE — Telephone Encounter (Signed)
Patient with post Covid booster side effects. Received booster yesterday. Today she has nausea. No other symptoms reported. Care Advice including diet recommendations until nausea resolved.    Reason for Disposition  COVID-19 vaccine, Frequently Asked Questions (FAQs)  Answer Assessment - Initial Assessment Questions 1. MAIN CONCERN OR SYMPTOM:  "What is your main concern right now?" "What question do you have?" "What's the main symptom you're worried about?" (e.g., fever, pain, redness, swelling)     Mild nausea after booster 2. VACCINE: "What vaccination did you receive?" (e.g., none; AstraZeneca, J&J, Moderna, Pfizer, other) "Is this your first, second shot, or booster?" (e.g., first, second, booster)     Second booster 3. SYMPTOM ONSET: "When did the nausea begin?" (e.g., not relevant; hours, days)      This morning 4. SYMPTOM SEVERITY: "How bad is it?"      Not bad 5. FEVER: "Is there a fever?" If Yes, ask: "What is it, how was it measured, and when did it start?"      None reported 6. PAST REACTIONS: "Have you reacted to immunizations before?" If Yes, ask: "What happened?"     None reported 7. OTHER SYMPTOMS: "Do you have any other symptoms?"     none  Protocols used: Coronavirus (COVID-19) Vaccine Questions and Reactions-A-AH

## 2020-11-07 ENCOUNTER — Other Ambulatory Visit: Payer: Self-pay | Admitting: Licensed Clinical Social Worker

## 2020-11-07 NOTE — Patient Outreach (Signed)
Medicaid Managed Care Social Work Note  11/07/2020 Name:  Cheryl Chandler MRN:  [id number] DOB:  1965/06/15  Cheryl Chandler is an 55 y.o. year old female who is a primary patient of Cheryl Grooms, Chandler.  The Medicaid Managed Care Coordination team was consulted for assistance with:  Mental Health Counseling and Resources Grief Counseling  Cheryl Chandler was given information about Medicaid Managed CareCoordination services today. Cheryl Chandler agreed to services and verbal consent obtained.  Engaged with patient  for by telephone forfollow up visit in response to referral for case management and/or care coordination services.   Assessments/Interventions:  Review of past medical history, allergies, medications, health status, including review of consultants reports, laboratory and other test data, was performed as part of comprehensive evaluation and provision of chronic care management services.  SDOH: (Social Determinant of Health) assessments and interventions performed: [name]    Flowsheet Row Most Recent Value  [name]   [name] for the Following Domains Stress  Stress Interventions Provide Counseling       Advanced Directives Status:  Not addressed in this encounter.  Care Plan                 Allergies  Allergen Reactions   Codeine Shortness Of Breath and Rash   Percocet [Oxycodone-Acetaminophen] Shortness Of Breath   Sulfa Antibiotics Shortness Of Breath and Rash   Aspirin Hives   Bee Venom     Bee stings   Darvon [Propoxyphene]     Darvocet-N 100   Latex    Oxycodone Nausea And Vomiting   Penicillins     Has patient had a PCN reaction causing immediate rash, facial/tongue/throat swelling, SOB or lightheadedness with hypotension: Unknown Has patient had a PCN reaction causing severe rash involving mucus membranes or skin necrosis: Unknown Has patient had a PCN reaction that required hospitalization: Unknown Has  patient had a PCN reaction occurring within the last 10 years: Unknown If all of the above answers are "NO", then may proceed with Cephalosporin use.    Medications Reviewed Today     Reviewed by Cheryl Grooms, Chandler (Nurse Practitioner) on 10/30/20 at 1445  Med List Status: <None>   Medication Order Taking? Sig Documenting Provider Last Dose Status Informant  ACCU-CHEK GUIDE test strip [id number] No  [provider] Taking Active   Accu-Chek Softclix Lancets lancets [id number] No SMARTSIG:Topical [provider] Taking Active   albuterol (PROVENTIL) (2.5 MG/3ML) 0.083% nebulizer solution [id number] No INHALE CONTENTS OF 1 VIAL IN NEBULIZER EVERY 4 HOURS AS NEEDED Cheryl Grooms, Chandler Taking Active   carvedilol (COREG) 3.125 MG tablet [id number] No Take 1 tablet (3.125 mg total) by mouth 2 (two) times daily with a meal. Cheryl Chandler Taking Active   citalopram (CELEXA) 20 MG tablet [id number] No TAKE 1 TABLET BY MOUTH ONCE DAILY Cheryl Grooms, Chandler Taking Active   EPINEPHrine 0.3 mg/0.3 mL IJ SOAJ injection [id number] No USE AS DIRECTED. Cheryl Grooms, Chandler Taking Active   furosemide (LASIX) 20 MG tablet [id number] No TAKE 1 TABLET BY MOUTH ONCE DAILY IN AFTERNOON AS NEEDED LEG EDEMA Cheryl Grooms, Chandler Taking Active   furosemide (LASIX) 40 MG tablet [id number] No Take 1 tablet (40 mg total) by mouth daily. Cheryl Nearing, PA-C Taking Active            Med Note Cheryl Chandler Oct 14, 2020  1:41 PM) Told to take in AM, not afternoon  gabapentin (NEURONTIN)  300 MG capsule [id number] No TAKE 1 CAPSULE BY MOUTH 3 TIMES DAILY ASNEEDED (NEED OFFICE VISIT FOR FURTHER REFILLS) Cheryl Grooms, Chandler Taking Active   levothyroxine (SYNTHROID) 50 MCG tablet [id number] No TAKE 1 TABLET BY MOUTH ONCE DAILY ON AN EMPTY STOMACH. WAIT 30 MINUTES BEFORE TAKING OTHER MEDS. Cheryl Grooms, Chandler Taking Active   lisinopril (ZESTRIL) 40 MG tablet [id number] No TAKE 1 TABLET BY  MOUTH ONCE DAILY Cheryl Grooms, Chandler Taking Active   meloxicam (MOBIC) 15 MG tablet [id number] No TAKE 1 TABLET BY MOUTH ONCE DAILY AS NEEDED WITH FOOD OR MILK Cheryl Grooms, Chandler Taking Active   methocarbamol (ROBAXIN) 750 MG tablet [id number] No TAKE 1-2 TABLETS BY MOUTH 3 TIMES DAILY AS NEEDED Cheryl Chandler Taking Active   mometasone-formoterol (DULERA) 100-5 MCG/ACT AERO [id number] No INHALE 2 PUFFS BY MOUTH TWICE A DAY. RINSE MOUTH AFTER USE. Cheryl Nearing, PA-C Taking Active   montelukast (SINGULAIR) 10 MG tablet [id number] No Take 1 tablet (10 mg total) by mouth at bedtime. Cheryl Grooms, Chandler Taking Active   nitroGLYCERIN (NITROSTAT) 0.4 MG SL tablet [id number] No DISSOLVE 1 TABLET UNDER TONGUE AT ONSET OF CHEST PAIN. REPEAT IN 5 MIN IF NOT RESOLVED, MAX 3 DOSES. 911 IF NEEDED. Cheryl Grooms, Chandler Taking Active   omeprazole (PRILOSEC) 20 MG capsule [id number] No Take 1 capsule (20 mg total) by mouth daily. Cheryl Chandler Taking Active   QUEtiapine (SEROQUEL) 25 MG tablet [id number] No TAKE 1 TABLET BY MOUTH AT BEDTIME Cheryl Grooms, Chandler Taking Active   simvastatin (ZOCOR) 40 MG tablet [id number] No TAKE 1 TABLET BY MOUTH AT BEDTIME Cheryl Grooms, Chandler Taking Active   SPIRIVA HANDIHALER 18 MCG inhalation capsule [id number]  INHALE CONTENTS OF 1 CAPSULE ONCE DAILY Cheryl Grooms, Chandler  Active   traMADol (ULTRAM) 50 MG tablet [id number] No Take 1 tablet (50 mg total) by mouth daily as needed.  Patient not taking: No sig reported   Cheryl Chandler, [place] Not Taking Active   triamcinolone cream (KENALOG) 0.1 % [id number] No APPLY 1 APPLICATION TOPICALLY TWICE DAILY  Patient not taking: Reported on 10/14/2020   Cheryl Skiff, Chandler Not Taking Active   TRULICITY 0.75 MG/0.5ML SOPN [id number] No INJECT 0.75MG  SUBQ ONCE A Saintclair Halsted, Chandler Taking Active   Vitamin D, Ergocalciferol, (DRISDOL) 1.25 MG (50000 UNIT) CAPS capsule [id number] No TAKE 1 CAPSULE  BY MOUTH ONCE EVERY 7 DAYS Dorcas Carrow, Chandler Taking Active             Patient Active Problem List   Diagnosis Date Noted   Fibrocystic disease of both breasts 05/07/2019   IFG (impaired fasting glucose) 05/03/2019   Chronic venous insufficiency 11/09/2018   Overactive bladder 09/30/2018   Peripheral neuropathy 09/30/2018   Apnea 01/02/2016   Lymphedema 11/30/2014   Dyspnea on exertion 11/30/2014   Anxiety 11/29/2014   Arthritis 11/29/2014   COPD (chronic obstructive pulmonary disease) (HCC) 11/29/2014   Clinical depression 11/29/2014   H/O eating disorder 11/29/2014   Esophageal reflux 11/29/2014   Acne inversa 11/29/2014   Essential hypertension 11/29/2014   HLD (hyperlipidemia) 11/29/2014   Adult hypothyroidism 11/29/2014   Insomnia 11/29/2014   Low back pain 11/29/2014   Morbid obesity (HCC) 11/29/2014   Posttraumatic stress disorder 11/29/2014   Vitamin D deficiency 11/29/2014   Nicotine dependence, cigarettes, uncomplicated 11/29/2014    Conditions to be addressed/monitored per PCP order:  Anxiety and Depression  Care Plan : General Social Work (Adult)  Updates made by Gustavus BryantJoyce, Traeton Bordas L, LCSW since 11/07/2020 12:00 AM     Problem: Coping Skills (General Plan of Care)      Long-Range Goal: Coping Skills Enhanced   Start Date: 11/07/2020  Recent Progress: On track  Priority: High  Note:   Timeframe:  Long-Range Goal Priority:  High Start Date:       11/07/20                      Expected End Date:  ongoing                Follow Up Date 11/29/20  Current Barriers:  Financial constraints Mental Health Concerns  Social Isolation Limited education about mental health support resources that are available to her within the local area* Cognitive Deficits Lacks knowledge of community resource  Clinical Social Work Clinical Goal(s):  Over the next 90 days, client will work with SW to address concerns related to experiencing ongoing symptoms of depression,  sadness and grief since the passing of her mother Over the next 90 days, patient will work with LCSW to address needs related to implementing appropriate self-care and depression management.   Interventions: Patient interviewed and appropriate assessments performed Provided mental health counseling with regards to patient's loss and ongoing stressors. Patient was very close with her mother who passed away a few years ago and she continues to experience symptoms of grief from this loss. Patient denies wanting LCSW to make a referral for grief counseling through [facility] again but was receptive to coping skill and resource education/information that was provided during session today. Patient reports decorating for the holidays helps her to connect with her mother as they did this together when she was alive.  Provided patient with information about managing depression within her daily life. Patient has ongoing grief symptoms as well. Patient states "I feel worn down." Patient reports that she talks out loud to her mother's picture and this is very helpful to her. Grief support resource education provided again during session today.  Provided reflective listening and implemented appropriate interventions to help suppport patient and her emotional needs  Discussed plans with patient for ongoing care management follow up and [facility] Ad[facility] with any urgent case management needs. Assisted patient/caregiver with obtaining information about health plan benefits LCSW completed past referral for family to have a wheelchair ramp built at their residence due to ongoing mobility concerns. Ramp has been successfully installed.  PCS actively involved with patient and providing ongoing services.  Positive reinforcement provided to patient for quitting smoking cigarettes. Coping skill review education provided for future triggers.   Brief self-care education provided to both patient and spouse. Patient confirms that she continues to go to food pantry for food insecurity. Resource information has been provided.  Patient reports ongoing pain and issues with mobility. Patient shares that her pain resides in her pains and legs. Emotional support and coping skill education provided. Patient confirms that her PCS aide continues to provide her with services. However, aide has to be reminded to put on her mask since patient is high risk. LCSW reviewed resources that [name] provided her with in the past.  Patient reports that she had a wonderful birthday spent with her family this past May. Patient reports receiving appropriate socialization. Patient reports that her spouse took her out to eat and gifted her with several things which was a nice  surprise.  Patient confirms stable transportation arrangements for upcoming PCP appointment this week.  Patient repots that she is losing a lot of weight but is not sure how much she has lost as she does not have a scale. Patient reports that a goal of hers is to lose weight but she has not been intentionally losing weight at this time.  Patient reports that her aide [place] continues to assist her with PCS and is an excellent support within the home for her and spouse.  Patient planted a tomato plant and successfully grew over 5 tomatoes over the summer. Patient is very proud of this achievement as this was her first time planting a tomato plant and is now interested in picking up gardening/planting as a hobby. Patient reports that her aunt continues to be a positive support for her in her life and someone that she can rely on in the case of emergencies.  Patient was educated on healthy self-care skills and advised her to use these into her daily routine.  Patient reports that she has completed two COVID vaccines and will soon get her booster.   Patient Self Care Activities:  Attends all  scheduled provider appointments Calls provider office for new concerns or questions  Please see past updates related to this goal by clicking on the "Past Updates" button in the selected goal        Follow up:  Patient agrees to Care Plan and Follow-up.  Plan: The Managed Medicaid care management team will reach out to the patient again over the next 30 days.  Date of next scheduled Social Work care management/care coordination outreach:  11/29/20  [name], BSW, MSW, LCSW Managed Medicaid LCSW [facility]  [facility] [email or web address] Phone: [phone]

## 2020-11-07 NOTE — Patient Instructions (Signed)
Visit Information  [name] was given information about Medicaid Managed Care team care coordination services as a part of their [facility] Medicaid benefit. Cheryl Chandler verbally consented to engagement with the [facility] Managed Care team.   For questions related to your [facility] health plan, please call: [phone] or go here:[email or web address]  If you would like to schedule transportation through your [facility] plan, please call the following number at least 2 days in advance of your appointment: [phone].  Call the [facility] Crisis Line at [phone], at any time, 24 hours a day, 7 days a week. If you are in danger or need immediate medical attention call 911.  If you would like help to quit smoking, call 1-800-QUIT-NOW (731-012-3593) OR Espaol: 1-855-Djelo-Ya (9-767-341-9379) o para ms informacin haga clic aqu or Text READY to 024-097 to register via text  Cheryl Chandler - following are the goals we discussed in your visit today:   Goals Addressed               This Visit's Progress     SW-"I need to focus on taking better care of myself" (pt-stated)        Timeframe:  Long-Range Goal Priority:  High Start Date:       11/07/20                      Expected End Date:  ongoing                Follow Up Date 11/29/20  Current Barriers:  Financial constraints Mental Health Concerns  Social Isolation Limited education about mental health support resources that are available to her within the local area* Cognitive Deficits Lacks knowledge of community resource: Grief support resources/programs that are offered free of charge  within the area  Clinical Social Work Clinical Goal(s):  Over the next 90 days, client will work with SW to address concerns related to experiencing ongoing symptoms of depression, sadness and grief since the passing of her mother Over the next 90 days, patient will work with LCSW to address needs  related to implementing appropriate self-care and depression management.   -Patient will notify [facility] team of any concerns. Contact numbers and information provided to patient.         [name], BSW, MSW, [employer] Managed Medicaid LCSW [facility]  [facility] [email or web address] Phone: [phone]

## 2020-11-14 DIAGNOSIS — R3981 Functional urinary incontinence: Secondary | ICD-10-CM | POA: Diagnosis not present

## 2020-11-14 DIAGNOSIS — Z993 Dependence on wheelchair: Secondary | ICD-10-CM | POA: Diagnosis not present

## 2020-11-20 ENCOUNTER — Ambulatory Visit (INDEPENDENT_AMBULATORY_CARE_PROVIDER_SITE_OTHER): Payer: Medicaid Other | Admitting: Nurse Practitioner

## 2020-11-20 ENCOUNTER — Other Ambulatory Visit: Payer: Self-pay

## 2020-11-20 ENCOUNTER — Encounter: Payer: Self-pay | Admitting: Nurse Practitioner

## 2020-11-20 ENCOUNTER — Emergency Department
Admission: EM | Admit: 2020-11-20 | Discharge: 2020-11-20 | Disposition: A | Discharge status: Home / Self Care | Payer: Medicaid Other | Attending: Emergency Medicine | Admitting: Emergency Medicine

## 2020-11-20 ENCOUNTER — Emergency Department: Payer: Medicaid Other

## 2020-11-20 VITALS — BP 113/60 | HR 80 | Temp 98.5°F

## 2020-11-20 DIAGNOSIS — E11628 Type 2 diabetes mellitus with other skin complications: Secondary | ICD-10-CM | POA: Diagnosis not present

## 2020-11-20 DIAGNOSIS — I10 Essential (primary) hypertension: Secondary | ICD-10-CM | POA: Diagnosis not present

## 2020-11-20 DIAGNOSIS — J45909 Unspecified asthma, uncomplicated: Secondary | ICD-10-CM | POA: Insufficient documentation

## 2020-11-20 DIAGNOSIS — I890 Lymphedema, not elsewhere classified: Secondary | ICD-10-CM

## 2020-11-20 DIAGNOSIS — R52 Pain, unspecified: Secondary | ICD-10-CM

## 2020-11-20 DIAGNOSIS — I872 Venous insufficiency (chronic) (peripheral): Secondary | ICD-10-CM | POA: Diagnosis not present

## 2020-11-20 DIAGNOSIS — F17210 Nicotine dependence, cigarettes, uncomplicated: Secondary | ICD-10-CM | POA: Insufficient documentation

## 2020-11-20 DIAGNOSIS — Z96641 Presence of right artificial hip joint: Secondary | ICD-10-CM | POA: Insufficient documentation

## 2020-11-20 DIAGNOSIS — Z91040 Latex allergy status: Secondary | ICD-10-CM | POA: Insufficient documentation

## 2020-11-20 DIAGNOSIS — M7989 Other specified soft tissue disorders: Secondary | ICD-10-CM | POA: Diagnosis not present

## 2020-11-20 DIAGNOSIS — L97519 Non-pressure chronic ulcer of other part of right foot with unspecified severity: Secondary | ICD-10-CM | POA: Diagnosis not present

## 2020-11-20 DIAGNOSIS — J449 Chronic obstructive pulmonary disease, unspecified: Secondary | ICD-10-CM | POA: Insufficient documentation

## 2020-11-20 DIAGNOSIS — Z79899 Other long term (current) drug therapy: Secondary | ICD-10-CM | POA: Insufficient documentation

## 2020-11-20 DIAGNOSIS — E039 Hypothyroidism, unspecified: Secondary | ICD-10-CM | POA: Insufficient documentation

## 2020-11-20 DIAGNOSIS — E11621 Type 2 diabetes mellitus with foot ulcer: Secondary | ICD-10-CM | POA: Diagnosis not present

## 2020-11-20 DIAGNOSIS — L539 Erythematous condition, unspecified: Secondary | ICD-10-CM | POA: Diagnosis present

## 2020-11-20 DIAGNOSIS — E1140 Type 2 diabetes mellitus with diabetic neuropathy, unspecified: Secondary | ICD-10-CM | POA: Insufficient documentation

## 2020-11-20 DIAGNOSIS — M79671 Pain in right foot: Secondary | ICD-10-CM | POA: Diagnosis not present

## 2020-11-20 DIAGNOSIS — L089 Local infection of the skin and subcutaneous tissue, unspecified: Secondary | ICD-10-CM

## 2020-11-20 LAB — BASIC METABOLIC PANEL
Anion gap: 12 (ref 5–15)
BUN: 16 mg/dL (ref 6–20)
CO2: 26 mmol/L (ref 22–32)
Calcium: 8.6 mg/dL — ABNORMAL LOW (ref 8.9–10.3)
Chloride: 100 mmol/L (ref 98–111)
Creatinine, Ser: 0.69 mg/dL (ref 0.44–1.00)
GFR, Estimated: >60 mL/min (ref >60)
Glucose, Bld: 99 mg/dL (ref 70–99)
Potassium: 3.9 mmol/L (ref 3.5–5.1)
Sodium: 138 mmol/L (ref 135–145)

## 2020-11-20 LAB — CBC
HCT: 40.3 % (ref 36.0–46.0)
Hemoglobin: 13.4 g/dL (ref 12.0–15.0)
MCH: 30.5 pg (ref 26.0–34.0)
MCHC: 33.3 g/dL (ref 30.0–36.0)
MCV: 91.6 fL (ref 80.0–100.0)
Platelets: 341 10*3/uL (ref 150–400)
RBC: 4.4 MIL/uL (ref 3.87–5.11)
RDW: 14.6 % (ref 11.5–15.5)
WBC: 10.6 10*3/uL — ABNORMAL HIGH (ref 4.0–10.5)
nRBC: 0 % (ref 0.0–0.2)

## 2020-11-20 LAB — LACTIC ACID, PLASMA: Lactic Acid, Venous: 1.9 mmol/L (ref 0.5–1.9)

## 2020-11-20 MED ORDER — DOXYCYCLINE MONOHYDRATE 100 MG PO TABS: 100.0000 mg | ORAL_TABLET | Freq: Two times a day (BID) | ORAL | 0 refills | Status: AC

## 2020-11-20 NOTE — Assessment & Plan Note (Signed)
Patient sent to the ER due to shortness of breath and O2 sats in low 90s. Patient having significant pain that seems abnormal for abrasion on foot.

## 2020-11-20 NOTE — ED Notes (Signed)
UA SENT TO LAB. NO ORDER.

## 2020-11-20 NOTE — ED Provider Notes (Signed)
[facility]-EMERGENCY DEPARTMENT  ____________________________________________  Time seen: Approximately 5:35 PM  I have reviewed the triage vital signs and the nursing notes.   HISTORY  Chief Complaint Foot Pain   Historian Patient     HPI Cheryl Chandler is a 55 y.o. female with a history of hypertension, diabetes, cellulitis, chronic lower extremity edema and neuropathy, presents to the emergency department with a 2 cm x 2 cm region of erythema along the plantar aspect of the right foot that has been present for the past 2 days.  Patient had similar foot complaints several months ago and was started on antibiotic by her PCP.  Patient reports that her symptoms essentially resolved but then reoccurred after she finished her course of antibiotics.  She denies drainage from the affected area.  Patient reports that she is "bedbound" and does not ambulate at baseline.  She denies fever and chills.  She cannot recall her last podiatry appointment.   Past Medical History:  Diagnosis Date   Arthritis    Asthma    COPD (chronic obstructive pulmonary disease) (HCC)    Hypertension    Neuropathy    Pericarditis    Personal history of urinary calculi 11/29/2014   Thyroid disease      Immunizations up to date:  Yes.     Past Medical History:  Diagnosis Date   Arthritis    Asthma    COPD (chronic obstructive pulmonary disease) (HCC)    Hypertension    Neuropathy    Pericarditis    Personal history of urinary calculi 11/29/2014   Thyroid disease     Patient Active Problem List   Diagnosis Date Noted   Fibrocystic disease of both breasts 05/07/2019   IFG (impaired fasting glucose) 05/03/2019   Chronic venous insufficiency 11/09/2018   Overactive bladder 09/30/2018   Peripheral neuropathy 09/30/2018   Apnea 01/02/2016   Lymphedema 11/30/2014   Dyspnea on exertion 11/30/2014   Anxiety 11/29/2014   Arthritis 11/29/2014   COPD (chronic obstructive pulmonary disease) (HCC)  11/29/2014   Clinical depression 11/29/2014   H/O eating disorder 11/29/2014   Esophageal reflux 11/29/2014   Acne inversa 11/29/2014   Essential hypertension 11/29/2014   HLD (hyperlipidemia) 11/29/2014   Adult hypothyroidism 11/29/2014   Insomnia 11/29/2014   Low back pain 11/29/2014   Morbid obesity (HCC) 11/29/2014   Posttraumatic stress disorder 11/29/2014   Vitamin D deficiency 11/29/2014   Nicotine dependence, cigarettes, uncomplicated 11/29/2014    Past Surgical History:  Procedure Laterality Date   APPENDECTOMY     arm surgery Left    pins   fatty tumor removed from back     JOINT REPLACEMENT Right    LEG SURGERY Bilateral    OVARIAN CYST SURGERY Right    took piece of right ovary due to cyst   TOTAL HIP ARTHROPLASTY Right    wisdom teeth removed      Prior to Admission medications   Medication Sig Start Date End Date Taking? Authorizing Provider  doxycycline (ADOXA) 100 MG tablet Take 1 tablet (100 mg total) by mouth 2 (two) times daily for 14 days. 11/20/20 12/04/20 Yes Pia MauWoods, Dacota Ruben M, PA-C  ACCU-CHEK GUIDE test strip  08/02/20   [provider]  Accu-Chek Softclix Lancets lancets SMARTSIG:Topical 07/11/20   [provider]  albuterol (PROVENTIL) (2.5 MG/3ML) 0.083% nebulizer solution INHALE CONTENTS OF 1 VIAL IN NEBULIZER EVERY 4 HOURS AS NEEDED 10/11/20   Larae GroomsHoldsworth, Karen, NP  amLODipine (NORVASC) 5 MG tablet Take 1  tablet (5 mg total) by mouth daily. 10/30/20   [name], NP  atorvastatin (LIPITOR) 80 MG tablet Take 1 tablet (80 mg total) by mouth daily. 10/30/20   [name], NP  carvedilol (COREG) 3.125 MG tablet Take 1 tablet (3.125 mg total) by mouth 2 (two) times daily with a meal. 04/03/20   Rumball, Darl Householder, DO  citalopram (CELEXA) 20 MG tablet TAKE 1 TABLET BY MOUTH ONCE DAILY 09/20/20   [name], NP  EPINEPHrine 0.3 mg/0.3 mL IJ SOAJ injection USE AS DIRECTED. 09/27/20   [name], NP  furosemide (LASIX) 20 MG  tablet TAKE 1 TABLET BY MOUTH ONCE DAILY IN AFTERNOON AS NEEDED LEG EDEMA 08/12/20   [name], NP  furosemide (LASIX) 40 MG tablet Take 1 tablet (40 mg total) by mouth daily. 05/03/19   [name], PA-C  gabapentin (NEURONTIN) 300 MG capsule TAKE 1 CAPSULE BY MOUTH 3 TIMES DAILY ASNEEDED (NEED OFFICE VISIT FOR FURTHER REFILLS) 09/20/20   [name], NP  levothyroxine (SYNTHROID) 50 MCG tablet TAKE 1 TABLET BY MOUTH ONCE DAILY ON AN EMPTY STOMACH. WAIT 30 MINUTES BEFORE TAKING OTHER MEDS. 10/30/20   [name], NP  lisinopril (ZESTRIL) 40 MG tablet TAKE 1 TABLET BY MOUTH ONCE DAILY 08/12/20   [name], NP  meloxicam (MOBIC) 15 MG tablet TAKE 1 TABLET BY MOUTH ONCE DAILY AS NEEDED WITH FOOD OR MILK 09/20/20   [name], NP  methocarbamol (ROBAXIN) 750 MG tablet TAKE 1-2 TABLETS BY MOUTH 3 TIMES DAILY AS NEEDED 02/02/20   [name], [name], NP  mometasone-formoterol (DULERA) 100-5 MCG/ACT AERO INHALE 2 PUFFS BY MOUTH TWICE A DAY. RINSE MOUTH AFTER USE. 05/03/19   [name], PA-C  montelukast (SINGULAIR) 10 MG tablet Take 1 tablet (10 mg total) by mouth at bedtime. 10/30/20   [name], NP  nitroGLYCERIN (NITROSTAT) 0.4 MG SL tablet DISSOLVE 1 TABLET UNDER TONGUE AT ONSET OF CHEST PAIN. REPEAT IN 5 MIN IF NOT RESOLVED, MAX 3 DOSES. 911 IF NEEDED. 07/31/20   [name], NP  omeprazole (PRILOSEC) 20 MG capsule Take 1 capsule (20 mg total) by mouth daily. 10/30/20   [name], NP  QUEtiapine (SEROQUEL) 25 MG tablet TAKE 1 TABLET BY MOUTH AT BEDTIME 06/04/20   [name], NP  SPIRIVA HANDIHALER 18 MCG inhalation capsule INHALE CONTENTS OF 1 CAPSULE ONCE DAILY 10/25/20   [name], NP  traMADol (ULTRAM) 50 MG tablet Take 1 tablet (50 mg total) by mouth daily as needed. Patient not taking: No sig reported 09/06/19   [name], PA-C  triamcinolone cream (KENALOG) 0.1 % APPLY 1 APPLICATION TOPICALLY TWICE DAILY Patient  not taking: Reported on 10/14/2020 02/02/20   Marjie Skiff, NP  TRULICITY 0.75 MG/0.5ML SOPN INJECT 0.75MG  SUBQ ONCE A WEEK 09/20/20   [name], NP  Vitamin D, Ergocalciferol, (DRISDOL) 1.25 MG (50000 UNIT) CAPS capsule TAKE 1 CAPSULE BY MOUTH ONCE EVERY 7 DAYS 10/30/20   [name], NP    Allergies Codeine, Percocet [oxycodone-acetaminophen], Sulfa antibiotics, Aspirin, Bee venom, Darvon [propoxyphene], Latex, Oxycodone, and Penicillins  Family History  Problem Relation Age of Onset   Seizures Sister        disorders    Social History Social History   Tobacco Use   Smoking status: Every Day    Packs/day: 0.25    Years: 30.00    Pack years: 7.50    Types: Cigarettes   Smokeless tobacco: Never   Tobacco comments:  Patient quit smoking and recently started back-08/08/2019  Vaping Use   Vaping Use: Never used  Substance Use Topics   Alcohol use: No    Alcohol/week: 0.0 standard drinks   Drug use: No     Review of Systems  Constitutional: No fever/chills Eyes:  No discharge ENT: No upper respiratory complaints. Respiratory: no cough. No SOB/ use of accessory muscles to breath Gastrointestinal:   No nausea, no vomiting.  No diarrhea.  No constipation. Musculoskeletal: Patient has right foot pain.  Skin: Negative for rash, abrasions, lacerations, ecchymosis.    ____________________________________________   PHYSICAL EXAM:  VITAL SIGNS: ED Triage Vitals  Enc Vitals Group     BP 11/20/20 1409 (!) 160/70     Pulse Rate 11/20/20 1409 85     Resp 11/20/20 1409 18     Temp 11/20/20 1412 98.8 F (37.1 C)     Temp Source 11/20/20 1412 Oral     SpO2 11/20/20 1409 96 %     Weight 11/20/20 1409 290 lb (131.5 kg)     Height 11/20/20 1409 5\' 4"  (1.626 m)     Head Circumference --      Peak Flow --      Pain Score 11/20/20 1409 10     Pain Loc --      Pain Edu? --      Excl. in GC? --      Constitutional: Alert and oriented. Well appearing and in  no acute distress. Eyes: Conjunctivae are normal. PERRL. EOMI. Head: Atraumatic. ENT: Cardiovascular: Normal rate, regular rhythm. Normal S1 and S2.  Good peripheral circulation. Respiratory: Normal respiratory effort without tachypnea or retractions. Lungs CTAB. Good air entry to the bases with no decreased or absent breath sounds Gastrointestinal: Bowel sounds x 4 quadrants. Soft and nontender to palpation. No guarding or rigidity. No distention. Musculoskeletal: Full range of motion to all extremities. No obvious deformities noted patient has 3+ pitting edema of the bilateral lower extremities.  Patient has evidence of venous stasis dermatitis bilaterally. Neurologic:  Normal for age. No gross focal neurologic deficits are appreciated.  Skin: Patient has a 2 cm x 2 cm region of erythema along the plantar aspect of the right foot.  No induration or palpable fluctuance.  Palpable dorsalis pedis pulse bilaterally and symmetrically.  Capillary refill less than 2 seconds on the right. Psychiatric: Mood and affect are normal for age. Speech and behavior are normal.   ____________________________________________   LABS (all labs ordered are listed, but only abnormal results are displayed)  Labs Reviewed  CBC - Abnormal; Notable for the following components:      Result Value   WBC 10.6 (*)    All other components within normal limits  BASIC METABOLIC PANEL - Abnormal; Notable for the following components:   Calcium 8.6 (*)    All other components within normal limits  LACTIC ACID, PLASMA   ____________________________________________  EKG   ____________________________________________  RADIOLOGY 11/22/20, personally viewed and evaluated these images (plain radiographs) as part of my medical decision making, as well as reviewing the written report by the radiologist.  DG Foot Complete Right  Result Date: 11/20/2020 CLINICAL DATA:  Pain and swelling EXAM: RIGHT FOOT COMPLETE  - 3+ VIEW COMPARISON:  None. FINDINGS: Negative for fracture or acute bony abnormality. Mild degenerative change first MTP. Degenerative spurring in the midfoot and calcaneus. Diffuse soft tissue swelling particularly of the forefoot dorsally. IMPRESSION: Soft tissue swelling.  No acute skeletal  abnormality. Electronically Signed   By: Marlan Palau M.D.   On: 11/20/2020 17:14    ____________________________________________    PROCEDURES  Procedure(s) performed:     Procedures     Medications - No data to display   ____________________________________________   INITIAL IMPRESSION / ASSESSMENT AND PLAN / ED COURSE  Pertinent labs & imaging results that were available during my care of the patient were reviewed by me and considered in my medical decision making (see chart for details).    Assessment and plan Diabetic foot wound 55 year old female presents to the emergency department with a 2 cm x 2 cm region of circumferential erythema without ulceration along the plantar aspect of the right foot without induration or palpable fluctuance patient had a palpable dorsalis pedis pulse and was otherwise neurovascularly intact.  Differential diagnosis included cellulitis, early diabetic foot ulcer, abscess, osteomyelitis...  There was no evidence of osteomyelitis on x-ray of the right foot or other acute abnormalities.  I used a bedside ultrasound which indicated findings consistent with cellulitis but no fluid collection concerning for abscess.  Will treat patient with doxycycline twice daily for the next 14 days for likely diabetic foot wound and possible early diabetic foot ulcer.  Will refer patient to the wound care center and advised patient to establish care with podiatry.  Return precautions were given to return with new or worsening symptoms.  All patient questions were answered.    ____________________________________________  FINAL CLINICAL IMPRESSION(S) / ED  DIAGNOSES  Final diagnoses:  Diabetic foot infection (HCC)      NEW MEDICATIONS STARTED DURING THIS VISIT:  ED Discharge Orders          Ordered    doxycycline (ADOXA) 100 MG tablet  2 times daily        11/20/20 1732                This chart was dictated using voice recognition software/Dragon. Despite best efforts to proofread, errors can occur which can change the meaning. Any change was purely unintentional.     Orvil Feil, PA-C 11/20/20 1741    Chesley Noon, MD 11/21/20 [phone]

## 2020-11-20 NOTE — Discharge Instructions (Addendum)
Take doxycycline twice daily for the next 14 days. Please make a follow-up appointment with both podiatry and the wound care center. Please return to the emergency department if symptoms seem to be worsening.

## 2020-11-20 NOTE — Progress Notes (Signed)
BP 113/60   Pulse 80   Temp 98.5 F (36.9 C)   LMP 06/09/2017 (Approximate)   SpO2 93%    Subjective:    Patient ID: Cheryl Chandler, female    DOB: [birth year], 55 y.o.   MRN: [id number]  HPI: Cheryl Chandler is a 55 y.o. female  Chief Complaint  Patient presents with   Foot Pain    right   RIGHT FOOT PAIN Patient presents to clinic with complaints of severe right foot pain. She has a place on the bottom of her foot that is causing her severe. Pain denies any open wound, drainage, fevers. She does have significant swelling and not able to use her lymphedema pumps due to the pain.   Relevant past medical, surgical, family and social history reviewed and updated as indicated. Interim medical history since our last visit reviewed. Allergies and medications reviewed and updated.  Review of Systems  Musculoskeletal:        Right foot pain and swelling   Per HPI unless specifically indicated above     Objective:    BP 113/60   Pulse 80   Temp 98.5 F (36.9 C)   LMP 06/09/2017 (Approximate)   SpO2 93%   Wt Readings from Last 3 Encounters:  08/05/20 300 lb (136.1 kg)  12/06/19 (!) 316 lb (143.3 kg)  06/30/19 (!) 329 lb (149.2 kg)    Physical Exam Vitals and nursing note reviewed.  Constitutional:      General: She is not in acute distress.    Appearance: Normal appearance. She is normal weight. She is not ill-appearing, toxic-appearing or diaphoretic.  HENT:     Head: Normocephalic.     Right Ear: External ear normal.     Left Ear: External ear normal.     Nose: Nose normal.     Mouth/Throat:     Mouth: Mucous membranes are moist.     Pharynx: Oropharynx is clear.  Eyes:     General:        Right eye: No discharge.        Left eye: No discharge.     Extraocular Movements: Extraocular movements intact.     Conjunctiva/sclera: Conjunctivae normal.     Pupils: Pupils are equal, round, and reactive to light.  Cardiovascular:     Rate and Rhythm:  Normal rate and regular rhythm.     Heart sounds: No murmur heard. Pulmonary:     Effort: Pulmonary effort is normal. No respiratory distress.     Breath sounds: Normal breath sounds. No wheezing or rales.  Musculoskeletal:        General: Swelling present.     Cervical back: Normal range of motion and neck supple.     Right lower leg: Edema present.     Left lower leg: Edema present.     Comments: Abrasion on heel of right foot.   Skin:    General: Skin is warm and dry.     Capillary Refill: Capillary refill takes less than 2 seconds.  Neurological:     General: No focal deficit present.     Mental Status: She is alert and oriented to person, place, and time. Mental status is at baseline.  Psychiatric:        Mood and Affect: Mood normal.        Behavior: Behavior normal.        Thought Content: Thought content normal.        Judgment: Judgment  normal.    Results for orders placed or performed in visit on 10/30/20  Comp Met (CMET)  Result Value Ref Range   Glucose 105 (H) 65 - 99 mg/dL   BUN 12 6 - 24 mg/dL   Creatinine, Ser 0.60 0.57 - 1.00 mg/dL   eGFR 107 >59 mL/min/1.73   BUN/Creatinine Ratio 20 9 - 23   Sodium 140 134 - 144 mmol/L   Potassium 4.5 3.5 - 5.2 mmol/L   Chloride 100 96 - 106 mmol/L   CO2 24 20 - 29 mmol/L   Calcium 8.8 8.7 - 10.2 mg/dL   Total Protein 6.5 6.0 - 8.5 g/dL   Albumin 3.9 3.8 - 4.9 g/dL   Globulin, Total 2.6 1.5 - 4.5 g/dL   Albumin/Globulin Ratio 1.5 1.2 - 2.2   Bilirubin Total 0.3 0.0 - 1.2 mg/dL   Alkaline Phosphatase 77 44 - 121 IU/L   AST 11 0 - 40 IU/L   ALT 17 0 - 32 IU/L  Lipid Profile  Result Value Ref Range   Cholesterol, Total 156 100 - 199 mg/dL   Triglycerides 175 (H) 0 - 149 mg/dL   HDL 35 (L) >39 mg/dL   VLDL Cholesterol Cal 30 5 - 40 mg/dL   LDL Chol Calc (NIH) 91 0 - 99 mg/dL   Chol/HDL Ratio 4.5 (H) 0.0 - 4.4 ratio  HgB A1c  Result Value Ref Range   Hgb A1c MFr Bld 5.9 (H) 4.8 - 5.6 %   Est. average glucose Bld  gHb Est-mCnc 123 mg/dL  TSH  Result Value Ref Range   TSH 1.670 0.450 - 4.500 uIU/mL  T4, free  Result Value Ref Range   Free T4 1.12 0.82 - 1.77 ng/dL  Vitamin D (25 hydroxy)  Result Value Ref Range   Vit D, 25-Hydroxy 16.7 (L) 30.0 - 100.0 ng/mL      Assessment & Plan:   Problem List Items Addressed This Visit       Cardiovascular and Mediastinum   Chronic venous insufficiency - Primary    Patient sent to the ER due to shortness of breath and O2 sats in low 90s. Patient having significant pain that seems abnormal for abrasion on foot.         Other   Lymphedema     Follow up plan: Return if symptoms worsen or fail to improve.   A total of 30 minutes were spent on this encounter today.  When total time is documented, this includes both the face-to-face and non-face-to-face time personally spent before, during and after the visit on the date of the encounter reviewing symptoms with patient.

## 2020-11-20 NOTE — ED Triage Notes (Signed)
Pt comes pov with right foot pain and swelling. Pt was sent from [facility]. Was treated for insect bite recently in same foot and feels like it's not getting better.

## 2020-11-21 ENCOUNTER — Telehealth: Payer: Self-pay

## 2020-11-21 DIAGNOSIS — L97411 Non-pressure chronic ulcer of right heel and midfoot limited to breakdown of skin: Secondary | ICD-10-CM

## 2020-11-21 DIAGNOSIS — E08621 Diabetes mellitus due to underlying condition with foot ulcer: Secondary | ICD-10-CM

## 2020-11-21 NOTE — Telephone Encounter (Signed)
Transition Care Management Follow-up Telephone Call Date of discharge and from where: 11/20/2020-[facility] How have you been since you were released from the hospital? Patient stated she is doing ok, still sore.  Any questions or concerns? No  Items Reviewed: Did the pt receive and understand the discharge instructions provided? Yes  Medications obtained and verified? Yes  Other? No  Any new allergies since your discharge? No  Dietary orders reviewed? N/A Do you have support at home? Yes   Home Care and Equipment/Supplies: Were home health services ordered? not applicable If so, what is the name of the agency? N/A  Has the agency set up a time to come to the patient's home? not applicable Were any new equipment or medical supplies ordered?  No What is the name of the medical supply agency? N/A Were you able to get the supplies/equipment? not applicable Do you have any questions related to the use of the equipment or supplies? No  Functional Questionnaire: (I = Independent and D = Dependent) ADLs: I  Bathing/Dressing- I  Meal Prep- I  Eating- I  Maintaining continence- I  Transferring/Ambulation- I  Managing Meds- I  Follow up appointments reviewed:  PCP Hospital f/u appt confirmed? Yes  Scheduled to see [name] on 12/04/2020 @ 3:20 pm. Specialist [facility] f/u appt confirmed? No   Are transportation arrangements needed? No  If their condition worsens, is the pt aware to call PCP or go to the Emergency Dept.? Yes Was the patient provided with contact information for the PCP's office or ED? Yes Was to pt encouraged to call back with questions or concerns? Yes

## 2020-11-21 NOTE — Telephone Encounter (Signed)
Copied from CRM [id number]. Topic: Referral - Request for Referral >> Nov 21, 2020  9:17 AM Wyonia Hough E wrote: Has patient seen PCP for this complaint? Yes  *If NO, is insurance requiring patient see PCP for this issue before PCP can refer them? Referral for which specialty: Podiatry  Preferred provider/office:  Dr. Rosetta Posner / [address] [place], [place] [phone] For a diabetic infection and diabetic ulcer/ Pt   Pt also needs referral for [facility] wound care / [address] [phone])  Pt received referrals from ED visit yesterday but needs authorization from PCP for insurance to cover / please advise

## 2020-11-21 NOTE — Telephone Encounter (Signed)
Patient called in to inform Cheryl Chandler that [facility] stated that they need a prior authorization in order for her Medicaid to pay for her care. Please advise Ph# [phone]

## 2020-11-22 NOTE — Telephone Encounter (Signed)
Will patient need referral to podiatry?

## 2020-11-22 NOTE — Telephone Encounter (Signed)
Copied from CRM [id number]. Topic: General - Other >> Nov 21, 2020  5:03 PM Pawlus, Maxine Glenn A wrote: Reason for CRM: Pt stated she is having issues with her insurance covering the referral for wound care,  pt requested a call back.

## 2020-11-22 NOTE — Telephone Encounter (Signed)
Pt called back requesting immediate assistance, she says she is in a lot of pain and wants to get this resolved soon.

## 2020-11-22 NOTE — Telephone Encounter (Signed)
Patient called in about needing authorization for referral for wound care. Please call back

## 2020-11-25 DIAGNOSIS — Z419 Encounter for procedure for purposes other than remedying health state, unspecified: Secondary | ICD-10-CM | POA: Diagnosis not present

## 2020-11-26 ENCOUNTER — Other Ambulatory Visit: Payer: Self-pay | Admitting: Nurse Practitioner

## 2020-11-26 NOTE — Telephone Encounter (Signed)
Requested Prescriptions  Pending Prescriptions Disp Refills  . nitroGLYCERIN (NITROSTAT) 0.4 MG SL tablet [Pharmacy Med Name: NITROGLYCERIN 0.4 MG SL TAB] 25 tablet 2    Sig: DISSOLVE 1 TABLET UNDER TONGUE AT ONSET OF CHEST PAIN. REPEAT IN 5 MIN IF NOT RESOLVED, MAX 3 DOSES. 911 IF NEEDED.     Cardiovascular:  Nitrates Failed - 11/26/2020  6:31 PM      Failed - Last BP in normal range    BP Readings from Last 1 Encounters:  11/20/20 (!) 154/68         Passed - Last Heart Rate in normal range    Pulse Readings from Last 1 Encounters:  11/20/20 74         Passed - Valid encounter within last 12 months    Recent Outpatient Visits          6 days ago Chronic venous insufficiency   [facility], [name], NP   3 weeks ago Chronic obstructive pulmonary disease, unspecified COPD type (HCC)   [facility] [name], NP   2 months ago Insect bite of right foot, initial encounter   [facility] [name], NP   7 months ago Adult hypothyroidism   [facility] Caro Laroche, DO   11 months ago Lymphedema   [facility] [name], [place]      Future Appointments            In 1 week [name], NP [facility]           . lisinopril (ZESTRIL) 40 MG tablet [Pharmacy Med Name: LISINOPRIL 40 MG TAB] 90 tablet 0    Sig: TAKE 1 TABLET BY MOUTH ONCE DAILY     Cardiovascular:  ACE Inhibitors Failed - 11/26/2020  6:31 PM      Failed - Last BP in normal range    BP Readings from Last 1 Encounters:  11/20/20 (!) 154/68         Passed - Cr in normal range and within 180 days    Creatinine  Date Value Ref Range Status  08/05/2014 0.86 mg/dL Final    Comment:    9.23-3.00 NOTE: New Reference Range  07/03/14    Creatinine, Ser  Date Value Ref Range Status  11/20/2020 0.69 0.44 - 1.00 mg/dL Final         Passed - K in normal range and within 180 days     Potassium  Date Value Ref Range Status  11/20/2020 3.9 3.5 - 5.1 mmol/L Final  08/05/2014 4.5 mmol/L Final    Comment:    3.5-5.1 NOTE: New Reference Range  07/03/14          Passed - Patient is not pregnant      Passed - Valid encounter within last 6 months    Recent Outpatient Visits          6 days ago Chronic venous insufficiency   [facility] [name], NP   3 weeks ago Chronic obstructive pulmonary disease, unspecified COPD type (HCC)   [facility] [name], NP   2 months ago Insect bite of right foot, initial encounter   [facility] [name], NP   7 months ago Adult hypothyroidism   [facility] Caro Laroche, DO   11 months ago Lymphedema   [facility] [place], Salley Hews, [place]      Future Appointments  In 1 week [name], NP [facility], [facility]           . SPIRIVA HANDIHALER 18 MCG inhalation capsule [Pharmacy Med Name: SPIRIVA HANDIHALER 18 MCG INH CAP] 30 capsule 0    Sig: INHALE CONTENTS OF 1 CAPSULE ONCE DAILY     Pulmonology:  Anticholinergic Agents Passed - 11/26/2020  6:31 PM      Passed - Valid encounter within last 12 months    Recent Outpatient Visits          6 days ago Chronic venous insufficiency   [facility] [name], NP   3 weeks ago Chronic obstructive pulmonary disease, unspecified COPD type (HCC)   [facility] [name], NP   2 months ago Insect bite of right foot, initial encounter   [facility] [name], NP   7 months ago Adult hypothyroidism   [facility] Caro Laroche, DO   11 months ago Lymphedema   [facility], Salley Hews, [place]      Future Appointments            In 1 week [name], NP [facility], [facility]

## 2020-11-26 NOTE — Telephone Encounter (Signed)
Requested medications are due for refill today.  yes  Requested medications are on the active medications list.  yes  Last refill. 07/11/2020  Future visit scheduled.   yes  Notes to clinic.  Prescription written by historical provider.

## 2020-11-29 ENCOUNTER — Ambulatory Visit: Payer: Self-pay

## 2020-12-02 ENCOUNTER — Telehealth: Payer: Self-pay | Admitting: General Practice

## 2020-12-02 ENCOUNTER — Ambulatory Visit: Payer: Self-pay | Admitting: General Practice

## 2020-12-02 DIAGNOSIS — I10 Essential (primary) hypertension: Secondary | ICD-10-CM

## 2020-12-02 DIAGNOSIS — J449 Chronic obstructive pulmonary disease, unspecified: Secondary | ICD-10-CM

## 2020-12-02 DIAGNOSIS — F17210 Nicotine dependence, cigarettes, uncomplicated: Secondary | ICD-10-CM

## 2020-12-02 DIAGNOSIS — E08621 Diabetes mellitus due to underlying condition with foot ulcer: Secondary | ICD-10-CM

## 2020-12-02 DIAGNOSIS — F419 Anxiety disorder, unspecified: Secondary | ICD-10-CM

## 2020-12-02 DIAGNOSIS — F339 Major depressive disorder, recurrent, unspecified: Secondary | ICD-10-CM

## 2020-12-02 NOTE — Telephone Encounter (Signed)
  Chronic Care Management   Note  12/02/2020 Name: Cheryl Chandler MRN: [id number] DOB: [birth year]  The patient called back and the call was completed. See new encounter.   Follow up plan: The care management team will reach out to the patient again over the next 30 to 60 days. The patient transitioning over to the [facility] team.   Alto Denver RN, MSN, CCM Community Care Coordinator [facility]  [facility] Mobile: [phone]

## 2020-12-02 NOTE — Chronic Care Management (AMB) (Signed)
Care Management    RN Visit Note  12/02/2020 Name: Cheryl Chandler MRN: [id number] DOB: 25-Jan-1966  Subjective: Cheryl Chandler is a 55 y.o. year old female who is a primary care patient of [name], NP. The care management team was consulted for assistance with disease management and care coordination needs.    Engaged with patient by telephone for follow up visit in response to provider referral for case management and/or care coordination services.   Consent to Services:   Cheryl Chandler was given information about Care Management services today including:  Care Management services includes personalized support from designated clinical staff supervised by her physician, including individualized plan of care and coordination with other care providers 24/7 contact phone numbers for assistance for urgent and routine care needs. The patient may stop case management services at any time by phone call to the office staff.  Patient agreed to services and consent obtained.   Assessment: Review of patient past medical history, allergies, medications, health status, including review of consultants reports, laboratory and other test data, was performed as part of comprehensive evaluation and provision of chronic care management services.   SDOH (Social Determinants of Health) assessments and interventions performed:  [name]    Flowsheet Row Most Recent Value  [name]   Financial Strain Interventions Other (Comment)  [working with the CCM team]  Physical Activity Interventions Other (Comments)  [limited mobility- wheelchair bound]  Stress Interventions Provide Counseling, Other (Comment)  [working with the LCSW]  Social Connections Interventions Other (Comment)  [is affiliated with church, cannot always go due to health]  Transportation Interventions Other (Comment)  [husband drives and usually borrows his dads car]  Depression Interventions/Treatment   --  Pharmacist, community patient working with LCSW, transition over to [facility] team]        Care Plan  Allergies  Allergen Reactions   Codeine Shortness Of Breath and Rash   Percocet [Oxycodone-Acetaminophen] Shortness Of Breath   Sulfa Antibiotics Shortness Of Breath and Rash   Aspirin Hives   Bee Venom     Bee stings   Darvon [Propoxyphene]     Darvocet-N 100   Latex    Oxycodone Nausea And Vomiting   Penicillins     Has patient had a PCN reaction causing immediate rash, facial/tongue/throat swelling, SOB or lightheadedness with hypotension: Unknown Has patient had a PCN reaction causing severe rash involving mucus membranes or skin necrosis: Unknown Has patient had a PCN reaction that required hospitalization: Unknown Has patient had a PCN reaction occurring within the last 10 years: Unknown If all of the above answers are "NO", then may proceed with Cephalosporin use.    Outpatient Encounter Medications as of 12/02/2020  Medication Sig Note   Accu-Chek Softclix Lancets lancets CHECK BLOOD SUGAR TWICE DAILY    ACCU-CHEK GUIDE test strip     albuterol (PROVENTIL) (2.5 MG/3ML) 0.083% nebulizer solution INHALE CONTENTS OF 1 VIAL IN NEBULIZER EVERY 4 HOURS AS NEEDED    amLODipine (NORVASC) 5 MG tablet Take 1 tablet (5 mg total) by mouth daily.    atorvastatin (LIPITOR) 80 MG tablet Take 1 tablet (80 mg total) by mouth daily.    carvedilol (COREG) 3.125 MG tablet Take 1 tablet (3.125 mg total) by mouth 2 (two) times daily with a meal.    citalopram (CELEXA) 20 MG tablet TAKE 1 TABLET BY MOUTH ONCE DAILY    doxycycline (ADOXA) 100 MG tablet Take 1 tablet (100 mg total) by mouth 2 (two)  times daily for 14 days.    EPINEPHrine 0.3 mg/0.3 mL IJ SOAJ injection USE AS DIRECTED.    furosemide (LASIX) 20 MG tablet TAKE 1 TABLET BY MOUTH ONCE DAILY IN AFTERNOON AS NEEDED LEG EDEMA    furosemide (LASIX) 40 MG tablet Take 1 tablet (40 mg total) by mouth daily. 10/14/2020: Told to take in AM, not afternoon    gabapentin (NEURONTIN) 300 MG capsule TAKE 1 CAPSULE BY MOUTH 3 TIMES DAILY ASNEEDED (NEED OFFICE VISIT FOR FURTHER REFILLS)    levothyroxine (SYNTHROID) 50 MCG tablet TAKE 1 TABLET BY MOUTH ONCE DAILY ON AN EMPTY STOMACH. WAIT 30 MINUTES BEFORE TAKING OTHER MEDS.    lisinopril (ZESTRIL) 40 MG tablet TAKE 1 TABLET BY MOUTH ONCE DAILY    meloxicam (MOBIC) 15 MG tablet TAKE 1 TABLET BY MOUTH ONCE DAILY AS NEEDED WITH FOOD OR MILK    methocarbamol (ROBAXIN) 750 MG tablet TAKE 1-2 TABLETS BY MOUTH 3 TIMES DAILY AS NEEDED    mometasone-formoterol (DULERA) 100-5 MCG/ACT AERO INHALE 2 PUFFS BY MOUTH TWICE A DAY. RINSE MOUTH AFTER USE.    montelukast (SINGULAIR) 10 MG tablet Take 1 tablet (10 mg total) by mouth at bedtime.    nitroGLYCERIN (NITROSTAT) 0.4 MG SL tablet DISSOLVE 1 TABLET UNDER TONGUE AT ONSET OF CHEST PAIN. REPEAT IN 5 MIN IF NOT RESOLVED, MAX 3 DOSES. 911 IF NEEDED.    omeprazole (PRILOSEC) 20 MG capsule Take 1 capsule (20 mg total) by mouth daily.    QUEtiapine (SEROQUEL) 25 MG tablet TAKE 1 TABLET BY MOUTH AT BEDTIME    SPIRIVA HANDIHALER 18 MCG inhalation capsule INHALE CONTENTS OF 1 CAPSULE ONCE DAILY    traMADol (ULTRAM) 50 MG tablet Take 1 tablet (50 mg total) by mouth daily as needed. (Patient not taking: No sig reported)    triamcinolone cream (KENALOG) 0.1 % APPLY 1 APPLICATION TOPICALLY TWICE DAILY (Patient not taking: Reported on 01/09/7828)    TRULICITY 5.62 ZH/0.8MV SOPN INJECT 0.75MG SUBQ ONCE A WEEK    Vitamin D, Ergocalciferol, (DRISDOL) 1.25 MG (50000 UNIT) CAPS capsule TAKE 1 CAPSULE BY MOUTH ONCE EVERY 7 DAYS    No facility-administered encounter medications on file as of 12/02/2020.    Patient Active Problem List   Diagnosis Date Noted   Fibrocystic disease of both breasts 05/07/2019   IFG (impaired fasting glucose) 05/03/2019   Chronic venous insufficiency 11/09/2018   Overactive bladder 09/30/2018   Peripheral neuropathy 09/30/2018   Apnea 01/02/2016   Lymphedema  11/30/2014   Dyspnea on exertion 11/30/2014   Anxiety 11/29/2014   Arthritis 11/29/2014   COPD (chronic obstructive pulmonary disease) ([place]) 11/29/2014   Clinical depression 11/29/2014   H/O eating disorder 11/29/2014   Esophageal reflux 11/29/2014   Acne inversa 11/29/2014   Essential hypertension 11/29/2014   HLD (hyperlipidemia) 11/29/2014   Adult hypothyroidism 11/29/2014   Insomnia 11/29/2014   Low back pain 11/29/2014   Morbid obesity ([place]) 11/29/2014   Posttraumatic stress disorder 11/29/2014   Vitamin D deficiency 11/29/2014   Nicotine dependence, cigarettes, uncomplicated 78/46/9629    Conditions to be addressed/monitored: HTN, COPD, Anxiety, Depression, and smoker and wound to right bottom foot  Care Plan : RNCM: COPD (Adult)  Updates made by Vanita Ingles since 12/02/2020 12:00 AM     Problem: RNCM: Psychological Adjustment to Diagnosis (COPD)   Priority: Medium     Long-Range Goal: RNCM: Adjustment to Disease Achieved   Start Date: 05/22/2020  Expected End Date: 11/27/2021  This Visit's Progress:  On track  Priority: High  Note:   Current Barriers:  Knowledge deficits related to basic understanding of COPD disease process Knowledge deficits related to basic COPD self care/management Knowledge deficit related to basic understanding of how to use inhalers and how inhaled medications work Knowledge deficit related to importance of energy conservation Limited Social Support Unable to independently manage COPD Lacks social connections Does not contact provider office for questions/concerns  Case Manager Clinical Goal(s): patient will report using inhalers as prescribed including rinsing mouth after use patient will be able to verbalize understanding of COPD action plan and when to seek appropriate levels of medical care  patient will engage in lite exercise as tolerated to build/regain stamina and strength and reduce shortness of breath through activity tolerance   patient will verbalize basic understanding of COPD disease process and self care activities  Interventions:  Collaboration with [name], NP regarding development and update of comprehensive plan of care as evidenced by provider attestation and co-signature Inter-disciplinary care team collaboration (see longitudinal plan of care) UNABLE to independently: manage COPD Provided patient with basic written and verbal COPD education on self care/management/and exacerbation prevention. 12-02-2020: The patient states she is not feeling well. Was in the ER recently for an infection in her right foot. The patient states that she needs to go to the wound clinic but her insurance will not pay for it. Has a follow up with the pcp on 12-04-2020. Advised the patient to discuss with the pcp her options and also talk about her change in her breathing. The patient has been taking her pulse ox readings and states that her levels have been low and in the 80's. The patient denies any acute breathing issues today but feels her health is not in the best place right now.  Provided patient with COPD action plan and reinforced importance of daily self assessment. 12-02-2020: Education and support given.  Provided written and verbal instructions on pursed lip breathing and utilized returned demonstration as teach back Provided instruction about proper use of medications used for management of COPD including inhalers Advised patient to self assesses COPD action plan zone and make appointment with provider if in the yellow zone for 48 hours without improvement. Provided patient with education about the role of exercise in the management of COPD Advised patient to engage in light exercise as tolerated 3-5 days a week Provided education about and advised patient to utilize infection prevention strategies to reduce risk of respiratory infection. 12-02-2020: The patient denies any pulmonary infections, is currently dealing with a  foot infections. Is on abx and will follow up with the pcp on 12-04-2020.  Patient Goals/Self-Care Activities:  - decision-making supported - depression screen reviewed - emotional support provided - family involvement promoted - problem-solving facilitated - relaxation techniques promoted - verbalization of feelings encouraged Follow Up Plan: Telephone follow up appointment with care management team member scheduled for: 12-02-2020: The patient transitioning to the [facility] team and will be followed by the CCM team for [facility]. The patient knows to expect a call from the scheduler to schedule with [facility]    Task: RNCM: Support Psychosocial Response to Chronic Obstructive Pulmonary Disease Completed 12/02/2020  Outcome: Positive  Note:   Care Management Activities:    - decision-making supported - depression screen reviewed - emotional support provided - family involvement promoted - problem-solving facilitated - relaxation techniques promoted - verbalization of feelings encouraged      Problem: RNCM: Symptom Exacerbation (COPD)   Priority: High  Long-Range Goal: RNCM: Smoking Cessation Symptom Exacerbation Prevented or Minimized   Start Date: 05/22/2020  Expected End Date: 11/27/2021  This Visit's Progress: Not on track  Priority: High  Note:   Current Barriers:  Unable to independently stop smoking  Does not adhere to provider recommendations re: smoking cessation  Lacks social connections Does not contact provider office for questions/concerns Tobacco abuse of >40 years; currently smoking 1 ppd per week Previous quit attempts, unsuccessful several successful using patches- has a pattern of stopping and then starting back  Reports smoking within 30 minutes of waking up Reports triggers to smoke include: stress, chronic conditions  Reports motivation to quit smoking includes: knows it will help her breathing and other chronic conditions  On a scale of 1-10, reports MOTIVATION to  quit is 8 On a scale of 1-10, reports CONFIDENCE in quitting is 7 Clinical Goal(s):  patient will work with RN Case Engineer, civil (consulting) and provider towards tobacco cessation  Interventions: Collaboration with [name], NP regarding development and update of comprehensive plan of care as evidenced by provider attestation and co-signature Inter-disciplinary care team collaboration (see longitudinal plan of care) Evaluation of current treatment plan reviewed. 12-02-2020: The patient is under a lot of stress and says smoking is what she does to  help with her stress relief.  Provided contact information for [place] [facility] (1-800-QUIT-NOW). Patient will outreach this group for support. Discussed plans with patient for ongoing care management follow up and provided patient with direct contact information for care management team Provided patient with printed smoking cessation educational materials Provided contact information for [place] [facility] (1-800-QUIT-NOW). Patient will outreach this group for support. Evaluation of current treatment plan reviewed-12-02-2020:  currently the patient is cutting back but wants to quit. Patient knows she can not smoke and use oxygen.  Patient Goals/Self-Care Activities patient will:  - mindfulness, talking to family and friends as a habit during cravings  - verbally commit to reducing tobacco consumption - barriers to lifestyle changes reviewed and addressed - barriers to treatment reviewed and addressed - breathing techniques encouraged - modification of home and work environment promoted - rescue (action) plan reviewed - signs/symptoms of infection reviewed - signs/symptoms of worsening disease assessed - treatment plan reviewed Follow Up Plan: Telephone follow up appointment with care management team member scheduled for:The patient is transitioning to the [facility] team and will be followed by new RNCM. The patient accepts transfer today.  12-02-2020    Task: RNCM: Identify and Minimize Risk of COPD Exacerbation Completed 12/02/2020  Outcome: Positive  Note:   Care Management Activities:    - barriers to lifestyle changes reviewed and addressed - barriers to treatment reviewed and addressed - breathing techniques encouraged - modification of home and work environment promoted - rescue (action) plan reviewed - signs/symptoms of infection reviewed - signs/symptoms of worsening disease assessed - treatment plan reviewed        Care Plan : RNCM: Urinary Incontinence (Adult)  Updates made by Vanita Ingles since 12/02/2020 12:00 AM  Completed 12/02/2020   Problem: RNCM: Disorder Identification (Urinary Incontinence) Resolved 12/02/2020  Priority: Medium     Goal: RNCM: Urinary Incontinence Identified Completed 12/02/2020  Priority: Medium  Note:   Current Barriers: no new issues related to Urinary incontinence. Closing this goal  Care Coordination needs related to urinary supplies and needs  in a patient with Urinary incontinence and OAB Chronic Disease Management support and education needs related to effective management of  urinary incontinence and OAB Lacks caregiver support.  Unable to independently manage OAB and Urinary incontinence  Lacks social connections Does not contact provider office for questions/concerns  Nurse Case Manager Clinical Goal(s):  Over the next 120 days, patient will verbalize understanding of plan for effective management of urinary issues Over the next 120 days, patient will work with [facility] and pcp  to address needs related to urinary incontinence and OAB Over the next 120 days, patient will demonstrate improved adherence to prescribed treatment plan for OAB and urinary incontinence as evidenced bydecreased episodes of incontinence, no UTI's, and following prescribed plan of care   Interventions:  1:1 collaboration with [name], NP regarding development and update of comprehensive plan  of care as evidenced by provider attestation and co-signature Inter-disciplinary care team collaboration (see longitudinal plan of care) Evaluation of current treatment plan related to OAB, urinary incontinence  and patient's adherence to plan as established by provider. Advised patient to call for worsening sx and sx of condition or questions  Provided education to patient re: incontinence products, hygiene, and safety Discussed plans with patient for ongoing care management follow up and provided patient with direct contact information for care management team  Patient Goals/Self-Care Activities Over the next 120 days, patient will:  - Patient will self administer medications as prescribed Patient will attend all scheduled provider appointments Patient will call pharmacy for medication refills Patient will continue to perform IADL's independently Patient will call provider office for new concerns or questions Patient will work with BSW to address care coordination needs and will continue to work with the clinical team to address health care and disease management related needs.   - nature and severity of incontinence assessed - signs of urinary tract infection assessed  Follow Up Plan: Telephone follow up appointment with care management team member scheduled for: 07-10-2020 at 1:45 pm        Task: RNCM: Identify Presence and Type of Urinary Incontinence Completed 12/02/2020  Outcome: Positive  Note:   Care Management Activities:    - nature and severity of incontinence assessed - signs of urinary tract infection assessed       Care Plan : RNCM: Hypertension (Adult)  Updates made by Vanita Ingles since 12/02/2020 12:00 AM     Problem: RNCM: Hypertension (Hypertension)   Priority: Medium     Long-Range Goal: RNCM: Hypertension Monitored   Start Date: 05/22/2020  Expected End Date: 11/27/2021  This Visit's Progress: On track  Priority: Medium  Note:   Objective:  Last  practice recorded BP readings:  BP Readings from Last 3 Encounters:  11/20/20 (!) 154/68  11/20/20 113/60  10/30/20 126/67    Most recent eGFR/CrCl: No results found for: EGFR  No components found for: CRCL Current Barriers:  Knowledge Deficits related to basic understanding of hypertension pathophysiology and self care management Knowledge Deficits related to understanding of medications prescribed for management of hypertension Limited Social Support Unable to independently manage HTN Lacks social connections Does not contact provider office for questions/concerns Case Manager Clinical Goal(s):   patient will verbalize understanding of plan for hypertension management patient will demonstrate improved adherence to prescribed treatment plan for hypertension as evidenced by taking all medications as prescribed, monitoring and recording blood pressure as directed, adhering to low sodium/DASH diet patient will demonstrate improved health management independence as evidenced by checking blood pressure as directed and notifying PCP if SBP>160 or DBP > 90, taking all medications as prescribe, and adhering to a  low sodium diet as discussed. Interventions:  Collaboration with [name], NP regarding development and update of comprehensive plan of care as evidenced by provider attestation and co-signature Inter-disciplinary care team collaboration (see longitudinal plan of care) Evaluation of current treatment plan related to hypertension self management and patient's adherence to plan as established by provider. 12-02-2020: The patient has been very stressed out and states that she feels her health is out of control. She says she was supposed to go have an ECHO but she has not been able to do so because she was in the ER recently and has an infection on the bottom of her right foot. She states she is still taking abx for the infection. Has a follow up with pcp on 12-04-2020. The patient advised to  seek emergent help for changes in her condition.  Provided education to patient re: stroke prevention, s/s of heart attack and stroke, DASH diet, complications of uncontrolled blood pressure Reviewed medications with patient and discussed importance of compliance. 12-02-2020: States compliance in medications. Is worried about getting what she needs to care for herself.  Discussed plans with patient for ongoing care management follow up and provided patient with direct contact information for care management team Advised patient, providing education and rationale, to monitor blood pressure daily and record, calling PCP for findings outside established parameters.  Patient Goals/Self-Care Activities Over the next 120 days, patient will:  - Self administers medications as prescribed Attends all scheduled provider appointments Calls provider office for new concerns, questions, or BP outside discussed parameters Checks BP and records as discussed Follows a low sodium diet/DASH diet - blood pressure trends reviewed - depression screen reviewed - home or ambulatory blood pressure monitoring encouraged Follow Up Plan: Telephone follow up appointment with care management team member scheduled for:  Transitioning to [facility] team. Will follow up with them at scheduled time.     Task: RNCM: Identify and Monitor Blood Pressure Elevation Completed 12/02/2020  Outcome: Positive  Note:   Care Management Activities:    - blood pressure trends reviewed - depression screen reviewed - home or ambulatory blood pressure monitoring encouraged        Care Plan : RNCM: Depression (Adult)  Updates made by Vanita Ingles since 12/02/2020 12:00 AM     Problem: RNCM: Symptoms (Depression)   Priority: High  Onset Date: 12/02/2020     Long-Range Goal: RNCM: Symptoms Monitored and Managed   Start Date: 12/02/2020  Expected End Date: 12/02/2021  This Visit's Progress: Not on track  Priority: High  Note:   Current  Barriers:  Ineffective Self Health Maintenance in a patient with Anxiety and Depression Unable to independently manage depression and anxiety as evidence of multiple chronic conditions and the patient feeling her health is out of control  Does not attend all scheduled provider appointments Lacks social connections Unable to perform IADLs independently Does not contact provider office for questions/concerns Clinical Goal(s):  Collaboration with [name], NP regarding development and update of comprehensive plan of care as evidenced by provider attestation and co-signature Inter-disciplinary care team collaboration (see longitudinal plan of care) patient will work with care management team to address care coordination and chronic disease management needs related to Disease Management Educational Needs Care Coordination Mental Health Counseling Level of Care Concerns   Interventions:  Evaluation of current treatment plan related to Anxiety and Depression, Financial constraints related to insurance not approving care for the patient to go to wound care , Limited social support,  Level of care concerns, ADL IADL limitations, Mental Health Concerns , Family and relationship dysfunction, Substance abuse issues -  smoker , and Inability to perform IADL's independently self-management and patient's adherence to plan as established by provider. Collaboration with [name], NP regarding development and update of comprehensive plan of care as evidenced by provider attestation       and co-signature Inter-disciplinary care team collaboration (see longitudinal plan of care) Discussed plans with patient for ongoing care management follow up and provided patient with direct contact information for care management team Evaluation of depression and anxiety. The patient is having a hard time and just has not felt well recently. Has an infection in her right foot and was in the ER on 11-20-2020. The  patient states that she is taking antibiotics but feels like things are not going well with her over all health and well being. Denies SI. The patient talked about family dysfunction and being stressed over her family and things they are doing and saying to her. Has support from her husband but he has his own health conditions. Empathetic listening and support given. Will benefit from continued CCM services throught the [facility] care management team. Disussed transition to the [facility] team today and the patient agress to transition.  Self Care Activities:  Patient verbalizes understanding of plan to effectively manage depression and anxiety Self administers medications as prescribed Attends all scheduled provider appointments Calls pharmacy for medication refills Attends church or other social activities Performs ADL's independently Performs IADL's independently Calls provider office for new concerns or questions Work with the [facility] team to meet needs for continued CCM services  Patient Goals: - activity or exercise based on tolerance encouraged - depression screen reviewed - emotional support provided - healthy lifestyle promoted - medication side effects monitored and managed - pain managed - participation in mental health treatment encouraged - quality of sleep assessed - response to mental health treatment monitored - response to pharmacologic therapy monitored - sleep hygiene techniques encouraged - social activities and relationships encouraged - substance use assessed Follow Up Plan: The care management team will reach out to the patient again over the next 30 to 60 days.  The patient transitioning over to the [facility] team    Task: Alleviate Barriers to Depression Treatment Completed 12/02/2020  Outcome: Positive  Note:   Care Management Activities:    - activity or exercise based on tolerance encouraged - depression screen reviewed - emotional support provided - healthy lifestyle  promoted - medication side effects monitored and managed - pain managed - participation in mental health treatment encouraged - quality of sleep assessed - response to mental health treatment monitored - response to pharmacologic therapy monitored - sleep hygiene techniques encouraged - social activities and relationships encouraged - substance use assessed    Notes:      Plan: The care management team will reach out to the patient again over the next 30 to 60 days. The patient is transitioning to the [facility] team for further CCM team involvement. Will be monitored by the [facility] team moving forward.   Noreene Larsson RN, MSN, [facility] Mobile: [phone]

## 2020-12-02 NOTE — Patient Instructions (Signed)
Visit Information   Goals Addressed             This Visit's Progress    RNCM: Keep Skin Clean and Dry       Follow Up Date: 30 to 60 days  - clean and dry skin well - keep skin dry - use a fragrance-free lotion on skin - wear a protective pad or garment    Why is this important?   Leaking urine (pee) can cause soreness from skin rashes and redness.  This is caused by skin being exposed to urine (pee).    12-02-2020: The patient states she currently has a wound on the bottom of her right foot. She was seen in the ER on 11-20-2020. The patient is currently taking antibiotics. She has a follow up with the pcp on 12-04-2020. Advised the patient to keep appointment with pcp and got back to the ER for fever, purulent drainage, odor, or changes in wound. The patient verbalized understanding.       COMPLETED: RNCM: Track and Manage My Symptoms-COPD       Timeframe:  Long-Range Goal Priority:  Medium Start Date:                             Expected End Date:                       Follow Up Date 07-10-2020   - develop a rescue plan - eliminate symptom triggers at home - follow rescue plan if symptoms flare-up - keep follow-up appointments - use an extra pillow to sleep    Why is this important?   Tracking your symptoms and other information about your health helps your doctor plan your care.  Write down the symptoms, the time of day, what you were doing and what medicine you are taking.  You will soon learn how to manage your symptoms.          RNCM: Track and Manage My Symptoms-Depression       Timeframe:  Long-Range Goal Priority:  High Start Date:    12-02-2020                         Expected End Date:     12-02-2021                  Follow Up Date follow up per the [facility] team   - avoid negative self-talk - develop a personal safety plan - develop a plan to deal with triggers like holidays, anniversaries - exercise at least 2 to 3 times per week - have a plan for how to  handle bad days - journal feelings and what helps to feel better or worse - spend time or talk with others at least 2 to 3 times per week - spend time or talk with others every day - watch for early signs of feeling worse    Why is this important?   Keeping track of your progress will help your treatment team find the right mix of medicine and therapy for you.  Write in your journal every day.  Day-to-day changes in depression symptoms are normal. It may be more helpful to check your progress at the end of each week instead of every day.     Notes: 12-02-2020: The patient states she is having a difficult time because of all the health  conditions she is faced with right now. She has a wound on her right foot and is currently taking abx.  She hasn't felt well. She comes back to see the pcp on 12-04-2020. Education on working with the CCM team to help manage health and well being.      RNCM: Track and Manage My Triggers-COPD       Timeframe:  Long-Range Goal Priority:  Medium Start Date:     05-22-2020                        Expected End Date:         05-22-2021              Follow Up Date 30 to 60 days   - avoid second hand smoke - eliminate smoking in my home - identify and avoid work-related triggers - identify and remove indoor air pollutants - limit outdoor activity during cold weather - listen for public air quality announcements every day    Why is this important?   Triggers are activities or things, like tobacco smoke or cold weather, that make your COPD (chronic obstructive pulmonary disease) flare-up.  Knowing these triggers helps you plan how to stay away from them.  When you cannot remove them, you can learn how to manage them.     12-02-2020: The patient states the ER doctor told her that she needs oxygen. The patient states that she has been taking her oxygen levels and they have been in the 80's. Recommended the patient to write down values and bring to the next pcp visit.          The patient verbalized understanding of instructions, educational materials, and care plan provided today and declined offer to receive copy of patient instructions, educational materials, and care plan.   The care management team will reach out to the patient again over the next 30 to 60 days.  Transitioning to the [facility] team. The patient is aware.   Alto Denver RN, MSN, CCM Community Care Coordinator [place]  [facility] Mobile: [phone]

## 2020-12-02 NOTE — Telephone Encounter (Signed)
  Care Management   Follow Up Note   12/02/2020 Name: Cheryl Chandler MRN: [id number] DOB: April 18, 1966   Referred by: [name], NP Reason for referral : Care Coordination (RNCM: Follow up for Chronic Disease Management and care coordination Needs )   An unsuccessful telephone outreach was attempted today. The patient was referred to the case management team for assistance with care management and care coordination. The patient called back and see the new encounter.  Follow Up Plan: A HIPPA compliant phone message was left for the patient providing contact information and requesting a return call.   Alto Denver RN, MSN, CCM Community Care Coordinator   [facility] Mobile: [phone]

## 2020-12-04 ENCOUNTER — Other Ambulatory Visit: Payer: Self-pay

## 2020-12-04 ENCOUNTER — Ambulatory Visit: Payer: Medicaid Other | Admitting: Nurse Practitioner

## 2020-12-04 ENCOUNTER — Encounter: Payer: Self-pay | Admitting: Nurse Practitioner

## 2020-12-04 ENCOUNTER — Ambulatory Visit (INDEPENDENT_AMBULATORY_CARE_PROVIDER_SITE_OTHER): Payer: Medicaid Other | Admitting: Nurse Practitioner

## 2020-12-04 VITALS — BP 120/64 | HR 71 | Temp 98.6°F

## 2020-12-04 DIAGNOSIS — L97411 Non-pressure chronic ulcer of right heel and midfoot limited to breakdown of skin: Secondary | ICD-10-CM | POA: Diagnosis not present

## 2020-12-04 DIAGNOSIS — I10 Essential (primary) hypertension: Secondary | ICD-10-CM

## 2020-12-04 DIAGNOSIS — E08621 Diabetes mellitus due to underlying condition with foot ulcer: Secondary | ICD-10-CM | POA: Diagnosis not present

## 2020-12-04 NOTE — Progress Notes (Signed)
BP 120/64   Pulse 71   Temp 98.6 F (37 C)   LMP 06/09/2017 (Approximate)   SpO2 93%    Subjective:    Patient ID: Cheryl Chandler, female    DOB: [birth year], 55 y.o.   MRN: [id number]  HPI: Cheryl Chandler is a 55 y.o. female  Chief Complaint  Patient presents with   Hypertension   HYPERTENSION Hypertension status: stable  Satisfied with current treatment? yes Duration of hypertension: years BP monitoring frequency:  not checking BP range:  BP medication side effects:  no Medication compliance: excellent compliance Previous BP meds: lasix, none, carvedilol, and lisinopril Aspirin: no Recurrent headaches: no Visual changes: no Palpitations: no Dyspnea: no Chest pain: no Lower extremity edema: no Dizzy/lightheaded: no  DIABETIC ULCER Patient states she has an appointment with Podiatry on the 16th.  She is still taking the Doxycycline. She has been soaking it to help with the infection also.  States it is improved from last visit.   Relevant past medical, surgical, family and social history reviewed and updated as indicated. Interim medical history since our last visit reviewed. Allergies and medications reviewed and updated.  Review of Systems  Eyes:  Negative for visual disturbance.  Respiratory:  Negative for cough, chest tightness and shortness of breath.   Cardiovascular:  Negative for chest pain, palpitations and leg swelling.  Skin:        Closed wound on bottom of foot  Neurological:  Negative for dizziness and headaches.   Per HPI unless specifically indicated above     Objective:    BP 120/64   Pulse 71   Temp 98.6 F (37 C)   LMP 06/09/2017 (Approximate)   SpO2 93%   Wt Readings from Last 3 Encounters:  11/20/20 290 lb (131.5 kg)  08/05/20 300 lb (136.1 kg)  12/06/19 (!) 316 lb (143.3 kg)    Physical Exam Vitals and nursing note reviewed.  Constitutional:      General: She is not in acute distress.    Appearance: Normal  appearance. She is obese. She is not ill-appearing, toxic-appearing or diaphoretic.  HENT:     Head: Normocephalic.     Right Ear: External ear normal.     Left Ear: External ear normal.     Nose: Nose normal.     Mouth/Throat:     Mouth: Mucous membranes are moist.     Pharynx: Oropharynx is clear.  Eyes:     General:        Right eye: No discharge.        Left eye: No discharge.     Extraocular Movements: Extraocular movements intact.     Conjunctiva/sclera: Conjunctivae normal.     Pupils: Pupils are equal, round, and reactive to light.  Cardiovascular:     Rate and Rhythm: Normal rate and regular rhythm.     Heart sounds: No murmur heard. Pulmonary:     Effort: Pulmonary effort is normal. No respiratory distress.     Breath sounds: Normal breath sounds. No wheezing or rales.  Musculoskeletal:     Cervical back: Normal range of motion and neck supple.     Right lower leg: Edema present.     Left lower leg: Edema present.       Feet:  Skin:    General: Skin is warm and dry.     Capillary Refill: Capillary refill takes less than 2 seconds.  Neurological:     General: No focal deficit  present.     Mental Status: She is alert and oriented to person, place, and time. Mental status is at baseline.  Psychiatric:        Mood and Affect: Mood normal.        Behavior: Behavior normal.        Thought Content: Thought content normal.        Judgment: Judgment normal.    Results for orders placed or performed during the hospital encounter of 11/20/20  CBC  Result Value Ref Range   WBC 10.6 (H) 4.0 - 10.5 K/uL   RBC 4.40 3.87 - 5.11 MIL/uL   Hemoglobin 13.4 12.0 - 15.0 g/dL   HCT 50.5 39.7 - 67.3 %   MCV 91.6 80.0 - 100.0 fL   MCH 30.5 26.0 - 34.0 pg   MCHC 33.3 30.0 - 36.0 g/dL   RDW 41.9 37.9 - 02.4 %   Platelets 341 150 - 400 K/uL   nRBC 0.0 0.0 - 0.2 %  Basic metabolic panel  Result Value Ref Range   Sodium 138 135 - 145 mmol/L   Potassium 3.9 3.5 - 5.1 mmol/L    Chloride 100 98 - 111 mmol/L   CO2 26 22 - 32 mmol/L   Glucose, Bld 99 70 - 99 mg/dL   BUN 16 6 - 20 mg/dL   Creatinine, Ser 0.97 0.44 - 1.00 mg/dL   Calcium 8.6 (L) 8.9 - 10.3 mg/dL   GFR, Estimated >35 >32 mL/min   Anion gap 12 5 - 15  Lactic acid, plasma  Result Value Ref Range   Lactic Acid, Venous 1.9 0.5 - 1.9 mmol/L      Assessment & Plan:   Problem List Items Addressed This Visit       Cardiovascular and Mediastinum   Essential hypertension    Chronic.  Controlled.  Improved from last visit.  Patient is not experiencing pain like she was at last visit. Continue with current medication regimen.  Return to clinic in 6 months for reevaluation.  Call sooner if concerns arise.         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 Recommend she make appointment with her [facility] specialist.        Follow up plan: Return in about 3 months (around 03/06/2021) for HTN, HLD, DM2 FU.

## 2020-12-04 NOTE — Assessment & Plan Note (Addendum)
Chronic.  Controlled.  Improved from last visit.  Patient is not experiencing pain like she was at last visit. Continue with current medication regimen.  Return to clinic in 6 months for reevaluation.  Call sooner if concerns arise.

## 2020-12-09 ENCOUNTER — Ambulatory Visit: Payer: Self-pay

## 2020-12-10 ENCOUNTER — Telehealth (INDEPENDENT_AMBULATORY_CARE_PROVIDER_SITE_OTHER): Payer: Self-pay | Admitting: Vascular Surgery

## 2020-12-10 ENCOUNTER — Ambulatory Visit: Payer: Medicaid Other | Admitting: Podiatry

## 2020-12-10 ENCOUNTER — Ambulatory Visit (INDEPENDENT_AMBULATORY_CARE_PROVIDER_SITE_OTHER): Payer: Medicaid Other

## 2020-12-10 ENCOUNTER — Other Ambulatory Visit: Payer: Self-pay

## 2020-12-10 DIAGNOSIS — L02611 Cutaneous abscess of right foot: Secondary | ICD-10-CM | POA: Diagnosis not present

## 2020-12-10 DIAGNOSIS — L539 Erythematous condition, unspecified: Secondary | ICD-10-CM

## 2020-12-10 DIAGNOSIS — E1142 Type 2 diabetes mellitus with diabetic polyneuropathy: Secondary | ICD-10-CM

## 2020-12-10 MED ORDER — DOXYCYCLINE HYCLATE 100 MG PO TABS: 100.0000 mg | ORAL_TABLET | Freq: Two times a day (BID) | ORAL | 0 refills | Status: AC

## 2020-12-10 NOTE — Telephone Encounter (Signed)
Patient has not been able to wear compression due to skin coming off while pulling compression stockings off. The patient stated that she has foot infection and has not used the lymphedema pump. Patient informed that she try to elevate as much as possible. I recommended the patient coming in the office for unna wraps. At this time she do not have any transportation but when she does she will call the office to be schedule.

## 2020-12-10 NOTE — Telephone Encounter (Signed)
Patient called in stating her PCP is concerned about her both of her legs being swollen.  Patient states she uses lipedema pumps at home for legs, they help a little bit but still has bad swelling.  Patient also states that Dr. Gilda Crease may have wanted her in unna wraps at one time but hadn't heard anything else. Patient was last seen on 08/05/2020 and has a 1 year follow in 07/2021. She wants to know if she needs to come in to be seen about it.  Please advise.

## 2020-12-10 NOTE — Telephone Encounter (Signed)
Even with lymphedema edema in the legs is not necessarily uncommon.  Even with use of the lymphedema pumps the patient may have some continued swelling if she is not utilizing medical grade 1 compression.  Typically if Dr. Gilda Crease would have wanted her in Unna wraps typically we do it in the office the day that it is recommended.  I would note that if the patient is not using medical grade 1 compression socks that she begin using them as that too will also help with her lower extremity edema.  She should also be reminded to elevate her lower extremities when ever she is not active.  The patient has not been doing all of these conservative measures I would suggest that she try to implement some of these before we have her return for follow-up visit.

## 2020-12-10 NOTE — Progress Notes (Signed)
Subjective:  Patient ID: Cheryl Chandler, female    DOB: Feb 08, 1966,  MRN: [id number]  Chief Complaint  Patient presents with   Wound Check    Right heel wound     55 y.o. female presents with the above complaint.  Patient presents with complaint of right heel pain.  Patient states been gone for quite some time.  She went to the emergency room where she was given antibiotics and was discharged and was told there might be an infection of the foot.  She is a diabetic with last A1c around 7%.  She states that she does have neuropathy.  She feels like something may have bit her on the foot.  She has completed course of doxycycline.  She wanted to get evaluated she has not seen anyone else prior to seeing me.  She denies any other acute complaints.  No nausea fever chills vomiting.   Review of Systems: Negative except as noted in the HPI. Denies N/V/F/Ch.  Past Medical History:  Diagnosis Date   Arthritis    Asthma    COPD (chronic obstructive pulmonary disease) (HCC)    Hypertension    Neuropathy    Pericarditis    Personal history of urinary calculi 11/29/2014   Thyroid disease     Current Outpatient Medications:    Accu-Chek Softclix Lancets lancets, CHECK BLOOD SUGAR TWICE DAILY, Disp: 100 each, Rfl: 1   doxycycline (VIBRA-TABS) 100 MG tablet, Take 1 tablet (100 mg total) by mouth 2 (two) times daily for 14 days., Disp: 28 tablet, Rfl: 0   ACCU-CHEK GUIDE test strip, , Disp: , Rfl:    albuterol (PROVENTIL) (2.5 MG/3ML) 0.083% nebulizer solution, INHALE CONTENTS OF 1 VIAL IN NEBULIZER EVERY 4 HOURS AS NEEDED, Disp: 225 mL, Rfl: 0   amLODipine (NORVASC) 5 MG tablet, Take 1 tablet (5 mg total) by mouth daily., Disp: 90 tablet, Rfl: 1   atorvastatin (LIPITOR) 80 MG tablet, Take 1 tablet (80 mg total) by mouth daily., Disp: 90 tablet, Rfl: 3   carvedilol (COREG) 3.125 MG tablet, Take 1 tablet (3.125 mg total) by mouth 2 (two) times daily with a meal., Disp: 60 tablet, Rfl: 0    citalopram (CELEXA) 20 MG tablet, TAKE 1 TABLET BY MOUTH ONCE DAILY, Disp: 90 tablet, Rfl: 1   EPINEPHrine 0.3 mg/0.3 mL IJ SOAJ injection, USE AS DIRECTED., Disp: 2 each, Rfl: 3   furosemide (LASIX) 20 MG tablet, TAKE 1 TABLET BY MOUTH ONCE DAILY IN AFTERNOON AS NEEDED LEG EDEMA, Disp: 30 tablet, Rfl: 1   furosemide (LASIX) 40 MG tablet, Take 1 tablet (40 mg total) by mouth daily., Disp: 90 tablet, Rfl: 1   gabapentin (NEURONTIN) 300 MG capsule, TAKE 1 CAPSULE BY MOUTH 3 TIMES DAILY ASNEEDED (NEED OFFICE VISIT FOR FURTHER REFILLS), Disp: 90 capsule, Rfl: 0   levothyroxine (SYNTHROID) 50 MCG tablet, TAKE 1 TABLET BY MOUTH ONCE DAILY ON AN EMPTY STOMACH. WAIT 30 MINUTES BEFORE TAKING OTHER MEDS., Disp: 90 tablet, Rfl: 1   lisinopril (ZESTRIL) 40 MG tablet, TAKE 1 TABLET BY MOUTH ONCE DAILY, Disp: 90 tablet, Rfl: 0   meloxicam (MOBIC) 15 MG tablet, TAKE 1 TABLET BY MOUTH ONCE DAILY AS NEEDED WITH FOOD OR MILK, Disp: 90 tablet, Rfl: 0   methocarbamol (ROBAXIN) 750 MG tablet, TAKE 1-2 TABLETS BY MOUTH 3 TIMES DAILY AS NEEDED, Disp: 90 tablet, Rfl: 0   mometasone-formoterol (DULERA) 100-5 MCG/ACT AERO, INHALE 2 PUFFS BY MOUTH TWICE A DAY. RINSE MOUTH AFTER USE., Disp:  13 g, Rfl: 5   montelukast (SINGULAIR) 10 MG tablet, Take 1 tablet (10 mg total) by mouth at bedtime., Disp: 90 tablet, Rfl: 1   nitroGLYCERIN (NITROSTAT) 0.4 MG SL tablet, DISSOLVE 1 TABLET UNDER TONGUE AT ONSET OF CHEST PAIN. REPEAT IN 5 MIN IF NOT RESOLVED, MAX 3 DOSES. 911 IF NEEDED., Disp: 25 tablet, Rfl: 2   omeprazole (PRILOSEC) 20 MG capsule, Take 1 capsule (20 mg total) by mouth daily., Disp: 30 capsule, Rfl: 12   QUEtiapine (SEROQUEL) 25 MG tablet, TAKE 1 TABLET BY MOUTH AT BEDTIME, Disp: 90 tablet, Rfl: 1   SPIRIVA HANDIHALER 18 MCG inhalation capsule, INHALE CONTENTS OF 1 CAPSULE ONCE DAILY, Disp: 30 capsule, Rfl: 0   traMADol (ULTRAM) 50 MG tablet, Take 1 tablet (50 mg total) by mouth daily as needed. (Patient not taking: No sig  reported), Disp: 30 tablet, Rfl: 0   triamcinolone cream (KENALOG) 0.1 %, APPLY 1 APPLICATION TOPICALLY TWICE DAILY (Patient not taking: Reported on 10/14/2020), Disp: 60 g, Rfl: 0   TRULICITY 0.75 MG/0.5ML SOPN, INJECT 0.75MG  SUBQ ONCE A WEEK, Disp: 6 mL, Rfl: 1   Vitamin D, Ergocalciferol, (DRISDOL) 1.25 MG (50000 UNIT) CAPS capsule, TAKE 1 CAPSULE BY MOUTH ONCE EVERY 7 DAYS, Disp: 12 capsule, Rfl: 1  Social History   Tobacco Use  Smoking Status Every Day   Packs/day: 0.25   Years: 30.00   Pack years: 7.50   Types: Cigarettes  Smokeless Tobacco Never  Tobacco Comments   Patient quit smoking and recently started back-08/08/2019    Allergies  Allergen Reactions   Codeine Shortness Of Breath and Rash   Percocet [Oxycodone-Acetaminophen] Shortness Of Breath   Sulfa Antibiotics Shortness Of Breath and Rash   Aspirin Hives   Bee Venom     Bee stings   Darvon [Propoxyphene]     Darvocet-N 100   Latex    Oxycodone Nausea And Vomiting   Penicillins     Has patient had a PCN reaction causing immediate rash, facial/tongue/throat swelling, SOB or lightheadedness with hypotension: Unknown Has patient had a PCN reaction causing severe rash involving mucus membranes or skin necrosis: Unknown Has patient had a PCN reaction that required hospitalization: Unknown Has patient had a PCN reaction occurring within the last 10 years: Unknown If all of the above answers are "NO", then may proceed with Cephalosporin use.   Objective:  There were no vitals filed for this visit. There is no height or weight on file to calculate BMI. Constitutional Well developed. Well nourished.  Vascular Dorsalis pedis pulses palpable bilaterally. Posterior tibial pulses palpable bilaterally. Capillary refill normal to all digits.  No cyanosis or clubbing noted. Pedal hair growth normal.  Neurologic Normal speech. Oriented to person, place, and time. Epicritic sensation to light touch grossly present  bilaterally.  Dermatologic Nails well groomed and normal in appearance. No open wounds. No skin lesions.  Orthopedic: Right heel epidermal lysis noted of the superficial skin.  No sinus tract noted.  No wound noted.  Mild fluctuance present present at the heel.  Mild redness noted.   Radiographs: 3 views of skeletally mature adult right heel:SkinNo foreign body noted.  No soft tissue emphysema noted.  His foot structure noted.  No bony abnormalities identified. Assessment:   1. Foot abscess, right   2. Erythema   3. Controlled type 2 diabetes mellitus with diabetic polyneuropathy, without long-term current use of insulin (HCC)    Plan:  Patient was evaluated and treated and all questions  answered.  Right heel superficial abscess formation with mild erythema -I explained to the patient the etiology of abscess formation versus treatment options were discussed.  Given that there is some epidermolysis without a setting of concern for hematoma I believe there may be a concern for abscess formation.  She has been on antibiotics which may have brought on the redness associated with it and I will plan on continuing the antibiotics.  Doxycycline was sent to the pharmacy.  I believe she will benefit from an MRI evaluation to rule out an abscess -MRI was ordered of the right heel  No follow-ups on file.

## 2020-12-11 ENCOUNTER — Telehealth: Payer: Self-pay

## 2020-12-11 MED ORDER — ACCU-CHEK GUIDE W/DEVICE KIT: PACK | 1 refills | Status: AC

## 2020-12-11 NOTE — Telephone Encounter (Signed)
Copied from CRM [id number]. Topic: General - Other >> Dec 11, 2020  9:48 AM Glean Salen wrote: Reason for CRM: patient asking can a1c tester be ordered. Please call back

## 2020-12-11 NOTE — Telephone Encounter (Signed)
Spoke with patient and informed her that Jolene sent over her A1c tester to her local pharmacy per patient's request and states her concern that she is just worried as her family is on her about possibly losing her leg due to being a diabetic and she is stressed about that and wondering if they will have to open her leg back up. Patient verbalized understanding.

## 2020-12-12 ENCOUNTER — Other Ambulatory Visit: Payer: Self-pay

## 2020-12-12 ENCOUNTER — Telehealth: Payer: Self-pay | Admitting: Family Medicine
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
## 2020-12-12 NOTE — Telephone Encounter (Signed)
Pt has glucometer and would like A1C tester

## 2020-12-12 NOTE — Telephone Encounter (Signed)
Appt with PCP please

## 2020-12-12 NOTE — Telephone Encounter (Signed)
Patient called and was scheduled for bil unna boots per phone note.

## 2020-12-12 NOTE — Patient Instructions (Signed)
Visit Information  Ms. Bensen was given information about Medicaid Managed Care team care coordination services as a part of their [facility] Medicaid benefit. Luceal Shweta Aman verbally consented to engagement with the [facility] Managed Care team.   If you are experiencing a medical emergency, please call 911 or report to your local emergency department or urgent care.   If you have a non-emergency medical problem during routine business hours, please contact your provider's office and ask to speak with a nurse.   For questions related to your [facility] health plan, please call: [phone] or go here:[email or web address]  If you would like to schedule transportation through your [facility] plan, please call the following number at least 2 days in advance of your appointment: [phone].  Call the [facility] at [phone], at any time, 24 hours a day, 7 days a week. If you are in danger or need immediate medical attention call 911.  If you would like help to quit smoking, call 1-800-QUIT-NOW (414)031-3954) OR Espaol: 1-855-Djelo-Ya (4-628-638-1771) o para ms informacin haga clic aqu or Text READY to 200-400 to register via text  [name] - following are the goals we discussed in your visit today:   Goals Addressed   None     Please see education materials related to DM provided as print materials.   The patient verbalized understanding of instructions provided today and agreed to receive a mailed copy of patient instruction and/or educational materials.  The Managed Medicaid care management team will reach out to the patient again over the next 90 days.   [place] Constable, Pharm.D., Managed Medicaid Pharmacist [phone]   Following is a copy of your plan of care:  Patient Care Plan: General Social Work (Adult)     Problem Identified: Coping Skills (General Plan of Care)      Long-Range Goal: Coping Skills  Enhanced   Start Date: 11/07/2020  Recent Progress: On track  Priority: High  Note:   Timeframe:  Long-Range Goal Priority:  High Start Date:       11/07/20                      Expected End Date:  ongoing                Follow Up Date 11/29/20  Current Barriers:  Financial constraints Mental Health Concerns  Social Isolation Limited education about mental health support resources that are available to her within the local area* Cognitive Deficits Lacks knowledge of community resource  Clinical Social Work Clinical Goal(s):  Over the next 90 days, client will work with SW to address concerns related to experiencing ongoing symptoms of depression, sadness and grief since the passing of her mother Over the next 90 days, patient will work with LCSW to address needs related to implementing appropriate self-care and depression management.   Interventions: Patient interviewed and appropriate assessments performed Provided mental health counseling with regards to patient's loss and ongoing stressors. Patient was very close with her mother who passed away a few years ago and she continues to experience symptoms of grief from this loss. Patient denies wanting LCSW to make a referral for grief counseling through [facility] again but was receptive to coping skill and resource education/information that was provided during session today. Patient reports decorating for the holidays helps her to connect with her mother as they did this together when she was alive.  Provided patient with information about managing depression within her  daily life. Patient has ongoing grief symptoms as well. Patient states "I feel worn down." Patient reports that she talks out loud to her mother's picture and this is very helpful to her. Grief support resource education provided again during session today.  Provided reflective listening and implemented appropriate interventions to help suppport patient and her emotional  needs  Discussed plans with patient for ongoing care management follow up and provided patient with direct contact information for [facility] team Advised patient to contact [facility] team with any urgent case management needs. Assisted patient/caregiver with obtaining information about health plan benefits LCSW completed past referral for family to have a wheelchair ramp built at their residence due to ongoing mobility concerns. Ramp has been successfully installed.  PCS actively involved with patient and providing ongoing services.  Positive reinforcement provided to patient for quitting smoking cigarettes. Coping skill review education provided for future triggers.  Brief self-care education provided to both patient and spouse. Patient confirms that she continues to go to food pantry for food insecurity. Resource information has been provided.  Patient reports ongoing pain and issues with mobility. Patient shares that her pain resides in her pains and legs. Emotional support and coping skill education provided. Patient confirms that her PCS aide continues to provide her with services. However, aide has to be reminded to put on her mask since patient is high risk. LCSW reviewed resources that [name] provided her with in the past.  Patient reports that she had a wonderful birthday spent with her family this past May. Patient reports receiving appropriate socialization. Patient reports that her spouse took her out to eat and gifted her with several things which was a nice surprise.  Patient confirms stable transportation arrangements for upcoming PCP appointment this week.  Patient repots that she is losing a lot of weight but is not sure how much she has lost as she does not have a scale. Patient reports that a goal of hers is to lose weight but she has not been intentionally losing weight at this time.  Patient reports that her aide [name] continues to assist her with PCS and is an excellent support within  the home for her and spouse.  Patient planted a tomato plant and successfully grew over 5 tomatoes over the summer. Patient is very proud of this achievement as this was her first time planting a tomato plant and is now interested in picking up gardening/planting as a hobby. Patient reports that her aunt continues to be a positive support for her in her life and someone that she can rely on in the case of emergencies.  Patient was educated on healthy self-care skills and advised her to use these into her daily routine.  Patient reports that she has completed two COVID vaccines and will soon get her booster.   Patient Self Care Activities:  Attends all scheduled provider appointments Calls provider office for new concerns or questions  Please see past updates related to this goal by clicking on the "Past Updates" button in the selected goal       Task: Support Psychosocial Response to Risk or Actual Health Condition   Note:   Care Management Activities:    - active listening utilized - caregiver stress acknowledged - counseling provided - current coping strategies identified - decision-making supported - healthy lifestyle promoted - journaling promoted - meditative movement therapy encouraged - mindfulness encouraged - participation in counseling encouraged - problem-solving facilitated - relaxation techniques promoted - self-reflection promoted -  spiritual activities promoted - verbalization of feelings encouraged    Notes:     Patient Care Plan: RNCM: COPD (Adult)     Problem Identified: RNCM: Psychological Adjustment to Diagnosis (COPD)   Priority: Medium     Long-Range Goal: RNCM: Adjustment to Disease Achieved   Start Date: 05/22/2020  Expected End Date: 11/27/2021  This Visit's Progress: On track  Priority: High  Note:   Current Barriers:  Knowledge deficits related to basic understanding of COPD disease process Knowledge deficits related to basic COPD self  care/management Knowledge deficit related to basic understanding of how to use inhalers and how inhaled medications work Knowledge deficit related to importance of energy conservation Limited Social Support Unable to independently manage COPD Lacks social connections Does not contact provider office for questions/concerns  Case Manager Clinical Goal(s): patient will report using inhalers as prescribed including rinsing mouth after use patient will be able to verbalize understanding of COPD action plan and when to seek appropriate levels of medical care  patient will engage in lite exercise as tolerated to build/regain stamina and strength and reduce shortness of breath through activity tolerance  patient will verbalize basic understanding of COPD disease process and self care activities  Interventions:  Collaboration with [name], NP regarding development and update of comprehensive plan of care as evidenced by provider attestation and co-signature Inter-disciplinary care team collaboration (see longitudinal plan of care) UNABLE to independently: manage COPD Provided patient with basic written and verbal COPD education on self care/management/and exacerbation prevention. 12-02-2020: The patient states she is not feeling well. Was in the ER recently for an infection in her right foot. The patient states that she needs to go to the wound clinic but her insurance will not pay for it. Has a follow up with the pcp on 12-04-2020. Advised the patient to discuss with the pcp her options and also talk about her change in her breathing. The patient has been taking her pulse ox readings and states that her levels have been low and in the 80's. The patient denies any acute breathing issues today but feels her health is not in the best place right now.  Provided patient with COPD action plan and reinforced importance of daily self assessment. 12-02-2020: Education and support given.  Provided written and  verbal instructions on pursed lip breathing and utilized returned demonstration as teach back Provided instruction about proper use of medications used for management of COPD including inhalers Advised patient to self assesses COPD action plan zone and make appointment with provider if in the yellow zone for 48 hours without improvement. Provided patient with education about the role of exercise in the management of COPD Advised patient to engage in light exercise as tolerated 3-5 days a week Provided education about and advised patient to utilize infection prevention strategies to reduce risk of respiratory infection. 12-02-2020: The patient denies any pulmonary infections, is currently dealing with a foot infections. Is on abx and will follow up with the pcp on 12-04-2020.  Patient Goals/Self-Care Activities:  - decision-making supported - depression screen reviewed - emotional support provided - family involvement promoted - problem-solving facilitated - relaxation techniques promoted - verbalization of feelings encouraged Follow Up Plan: Telephone follow up appointment with care management team member scheduled for: 12-02-2020: The patient transitioning to the [facility] team and will be followed by the CCM team for [facility]. The patient knows to expect a call from the scheduler to schedule with [facility]    Task: RNCM: Support  Psychosocial Response to Chronic Obstructive Pulmonary Disease Completed 12/02/2020  Outcome: Positive  Note:   Care Management Activities:    - decision-making supported - depression screen reviewed - emotional support provided - family involvement promoted - problem-solving facilitated - relaxation techniques promoted - verbalization of feelings encouraged      Problem Identified: RNCM: Symptom Exacerbation (COPD)   Priority: High     Long-Range Goal: RNCM: Smoking Cessation Symptom Exacerbation Prevented or Minimized   Start Date: 05/22/2020  Expected End Date: 11/27/2021   This Visit's Progress: Not on track  Priority: High  Note:   Current Barriers:  Unable to independently stop smoking  Does not adhere to provider recommendations re: smoking cessation  Lacks social connections Does not contact provider office for questions/concerns Tobacco abuse of >40 years; currently smoking 1 ppd per week Previous quit attempts, unsuccessful several successful using patches- has a pattern of stopping and then starting back  Reports smoking within 30 minutes of waking up Reports triggers to smoke include: stress, chronic conditions  Reports motivation to quit smoking includes: knows it will help her breathing and other chronic conditions  On a scale of 1-10, reports MOTIVATION to quit is 8 On a scale of 1-10, reports CONFIDENCE in quitting is 7 Clinical Goal(s):  patient will work with RN Case Engineer, civil (consulting) and provider towards tobacco cessation  Interventions: Collaboration with [name], NP regarding development and update of comprehensive plan of care as evidenced by provider attestation and co-signature Inter-disciplinary care team collaboration (see longitudinal plan of care) Evaluation of current treatment plan reviewed. 12-02-2020: The patient is under a lot of stress and says smoking is what she does to  help with her stress relief.  Provided contact information for [place] [facility]uit [facility] (1-800-QUIT-NOW). Patient will outreach this group for support. Discussed plans with patient for ongoing care management follow up and provided patient with direct contact information for care management team Provided patient with printed smoking cessation educational materials Provided contact information for [place] [facility]uit [facility] (1-800-QUIT-NOW). Patient will outreach this group for support. Evaluation of current treatment plan reviewed-12-02-2020:  currently the patient is cutting back but wants to quit. Patient knows she can not smoke and use oxygen.  Patient  Goals/Self-Care Activities patient will:  - mindfulness, talking to family and friends as a habit during cravings  - verbally commit to reducing tobacco consumption - barriers to lifestyle changes reviewed and addressed - barriers to treatment reviewed and addressed - breathing techniques encouraged - modification of home and work environment promoted - rescue (action) plan reviewed - signs/symptoms of infection reviewed - signs/symptoms of worsening disease assessed - treatment plan reviewed Follow Up Plan: Telephone follow up appointment with care management team member scheduled for:The patient is transitioning to the [facility] team and will be followed by new RNCM. The patient accepts transfer today. 12-02-2020    Task: RNCM: Identify and Minimize Risk of COPD Exacerbation Completed 12/02/2020  Outcome: Positive  Note:   Care Management Activities:    - barriers to lifestyle changes reviewed and addressed - barriers to treatment reviewed and addressed - breathing techniques encouraged - modification of home and work environment promoted - rescue (action) plan reviewed - signs/symptoms of infection reviewed - signs/symptoms of worsening disease assessed - treatment plan reviewed        Patient Care Plan: RNCM: Urinary Incontinence (Adult)  Completed 12/02/2020   Problem Identified: RNCM: Disorder Identification (Urinary Incontinence) Resolved 12/02/2020  Priority: Medium  Goal: RNCM: Urinary Incontinence Identified Completed 12/02/2020  Priority: Medium  Note:   Current Barriers: no new issues related to Urinary incontinence. Closing this goal  Care Coordination needs related to urinary supplies and needs  in a patient with Urinary incontinence and OAB Chronic Disease Management support and education needs related to effective management of urinary incontinence and OAB Lacks caregiver support.  Unable to independently manage OAB and Urinary incontinence  Lacks social  connections Does not contact provider office for questions/concerns  Nurse Case Manager Clinical Goal(s):  Over the next 120 days, patient will verbalize understanding of plan for effective management of urinary issues Over the next 120 days, patient will work with [facility] and pcp  to address needs related to urinary incontinence and OAB Over the next 120 days, patient will demonstrate improved adherence to prescribed treatment plan for OAB and urinary incontinence as evidenced bydecreased episodes of incontinence, no UTI's, and following prescribed plan of care   Interventions:  1:1 collaboration with [name], NP regarding development and update of comprehensive plan of care as evidenced by provider attestation and co-signature Inter-disciplinary care team collaboration (see longitudinal plan of care) Evaluation of current treatment plan related to OAB, urinary incontinence  and patient's adherence to plan as established by provider. Advised patient to call for worsening sx and sx of condition or questions  Provided education to patient re: incontinence products, hygiene, and safety Discussed plans with patient for ongoing care management follow up and provided patient with direct contact information for care management team  Patient Goals/Self-Care Activities Over the next 120 days, patient will:  - Patient will self administer medications as prescribed Patient will attend all scheduled provider appointments Patient will call pharmacy for medication refills Patient will continue to perform IADL's independently Patient will call provider office for new concerns or questions Patient will work with BSW to address care coordination needs and will continue to work with the clinical team to address health care and disease management related needs.   - nature and severity of incontinence assessed - signs of urinary tract infection assessed  Follow Up Plan: Telephone follow up appointment  with care management team member scheduled for: 07-10-2020 at 1:45 pm        Task: RNCM: Identify Presence and Type of Urinary Incontinence Completed 12/02/2020  Outcome: Positive  Note:   Care Management Activities:    - nature and severity of incontinence assessed - signs of urinary tract infection assessed       Patient Care Plan: RNCM: Hypertension (Adult)     Problem Identified: RNCM: Hypertension (Hypertension)   Priority: Medium     Long-Range Goal: RNCM: Hypertension Monitored   Start Date: 05/22/2020  Expected End Date: 11/27/2021  This Visit's Progress: On track  Priority: Medium  Note:   Objective:  Last practice recorded BP readings:  BP Readings from Last 3 Encounters:  11/20/20 (!) 154/68  11/20/20 113/60  10/30/20 126/67    Most recent eGFR/CrCl: No results found for: EGFR  No components found for: CRCL Current Barriers:  Knowledge Deficits related to basic understanding of hypertension pathophysiology and self care management Knowledge Deficits related to understanding of medications prescribed for management of hypertension Limited Social Support Unable to independently manage HTN Lacks social connections Does not contact provider office for questions/concerns Case Manager Clinical Goal(s):   patient will verbalize understanding of plan for hypertension management patient will demonstrate improved adherence to prescribed treatment plan for hypertension as evidenced by taking all medications  as prescribed, monitoring and recording blood pressure as directed, adhering to low sodium/DASH diet patient will demonstrate improved health management independence as evidenced by checking blood pressure as directed and notifying PCP if SBP>160 or DBP > 90, taking all medications as prescribe, and adhering to a low sodium diet as discussed. Interventions:  Collaboration with [name], NP regarding development and update of comprehensive plan of care as  evidenced by provider attestation and co-signature Inter-disciplinary care team collaboration (see longitudinal plan of care) Evaluation of current treatment plan related to hypertension self management and patient's adherence to plan as established by provider. 12-02-2020: The patient has been very stressed out and states that she feels her health is out of control. She says she was supposed to go have an ECHO but she has not been able to do so because she was in the ER recently and has an infection on the bottom of her right foot. She states she is still taking abx for the infection. Has a follow up with pcp on 12-04-2020. The patient advised to seek emergent help for changes in her condition.  Provided education to patient re: stroke prevention, s/s of heart attack and stroke, DASH diet, complications of uncontrolled blood pressure Reviewed medications with patient and discussed importance of compliance. 12-02-2020: States compliance in medications. Is worried about getting what she needs to care for herself.  Discussed plans with patient for ongoing care management follow up and provided patient with direct contact information for care management team Advised patient, providing education and rationale, to monitor blood pressure daily and record, calling PCP for findings outside established parameters.  Patient Goals/Self-Care Activities Over the next 120 days, patient will:  - Self administers medications as prescribed Attends all scheduled provider appointments Calls provider office for new concerns, questions, or BP outside discussed parameters Checks BP and records as discussed Follows a low sodium diet/DASH diet - blood pressure trends reviewed - depression screen reviewed - home or ambulatory blood pressure monitoring encouraged Follow Up Plan: Telephone follow up appointment with care management team member scheduled for:  Transitioning to [facility] team. Will follow up with them at scheduled time.      Task: RNCM: Identify and Monitor Blood Pressure Elevation Completed 12/02/2020  Outcome: Positive  Note:   Care Management Activities:    - blood pressure trends reviewed - depression screen reviewed - home or ambulatory blood pressure monitoring encouraged        Patient Care Plan: Medication Management     Problem Identified: Health Promotion or Disease Self-Management (General Plan of Care)      Goal: Medication Management   Note:   Current Barriers:  Does not maintain contact with provider office Does not contact provider office for questions/concerns   Pharmacist Clinical Goal(s):  Over the next 90 days, patient will contact provider office for questions/concerns as evidenced notation of same in electronic health record through collaboration with PharmD and provider.    Interventions: Inter-disciplinary care team collaboration (see longitudinal plan of care) Comprehensive medication review performed; medication list updated in electronic medical record      Patient Goals/Self-Care Activities Over the next 30 days, patient will:  - collaborate with provider on medication access solutions  Follow Up Plan: The patient has been provided with contact information for the care management team and has been advised to call with any health related questions or concerns.      Task: Mutually Develop and Royce Macadamia Achievement of Patient Goals   Note:  Care Management Activities:    - verbalization of feelings encouraged    Notes:     Patient Care Plan: RNCM: Depression (Adult)     Problem Identified: RNCM: Symptoms (Depression)   Priority: High  Onset Date: 12/02/2020     Long-Range Goal: RNCM: Symptoms Monitored and Managed   Start Date: 12/02/2020  Expected End Date: 12/02/2021  This Visit's Progress: Not on track  Priority: High  Note:   Current Barriers:  Ineffective Self Health Maintenance in a patient with Anxiety and Depression Unable to independently  manage depression and anxiety as evidence of multiple chronic conditions and the patient feeling her health is out of control  Does not attend all scheduled provider appointments Lacks social connections Unable to perform IADLs independently Does not contact provider office for questions/concerns Clinical Goal(s):  Collaboration with [name], NP regarding development and update of comprehensive plan of care as evidenced by provider attestation and co-signature Inter-disciplinary care team collaboration (see longitudinal plan of care) patient will work with care management team to address care coordination and chronic disease management needs related to Disease Management Educational Needs Care Coordination Mental Health Counseling Level of Care Concerns   Interventions:  Evaluation of current treatment plan related to Anxiety and Depression, Financial constraints related to insurance not approving care for the patient to go to wound care , Limited social support, Level of care concerns, ADL IADL limitations, Mental Health Concerns , Family and relationship dysfunction, Substance abuse issues -  smoker , and Inability to perform IADL's independently self-management and patient's adherence to plan as established by provider. Collaboration with [name], NP regarding development and update of comprehensive plan of care as evidenced by provider attestation       and co-signature Inter-disciplinary care team collaboration (see longitudinal plan of care) Discussed plans with patient for ongoing care management follow up and provided patient with direct contact information for care management team Evaluation of depression and anxiety. The patient is having a hard time and just has not felt well recently. Has an infection in her right foot and was in the ER on 11-20-2020. The patient states that she is taking antibiotics but feels like things are not going well with her over all health  and well being. Denies SI. The patient talked about family dysfunction and being stressed over her family and things they are doing and saying to her. Has support from her husband but he has his own health conditions. Empathetic listening and support given. Will benefit from continued CCM services throught the [facility] care management team. Disussed transition to the [facility] team today and the patient agress to transition.  Self Care Activities:  Patient verbalizes understanding of plan to effectively manage depression and anxiety Self administers medications as prescribed Attends all scheduled provider appointments Calls pharmacy for medication refills Attends church or other social activities Performs ADL's independently Performs IADL's independently Calls provider office for new concerns or questions Work with the [facility] team to meet needs for continued CCM services  Patient Goals: - activity or exercise based on tolerance encouraged - depression screen reviewed - emotional support provided - healthy lifestyle promoted - medication side effects monitored and managed - pain managed - participation in mental health treatment encouraged - quality of sleep assessed - response to mental health treatment monitored - response to pharmacologic therapy monitored - sleep hygiene techniques encouraged - social activities and relationships encouraged - substance use assessed Follow Up Plan: The care management team will reach out  to the patient again over the next 30 to 60 days.  The patient transitioning over to the [facility] team    Task: Alleviate Barriers to Depression Treatment Completed 12/02/2020  Outcome: Positive  Note:   Care Management Activities:    - activity or exercise based on tolerance encouraged - depression screen reviewed - emotional support provided - healthy lifestyle promoted - medication side effects monitored and managed - pain managed - participation in mental health treatment  encouraged - quality of sleep assessed - response to mental health treatment monitored - response to pharmacologic therapy monitored - sleep hygiene techniques encouraged - social activities and relationships encouraged - substance use assessed    Notes:

## 2020-12-12 NOTE — Telephone Encounter (Signed)
-----   Message from Zettie Pho, [facility] sent at 12/12/2020  2:31 PM EDT ----- Regarding: Elevated BP Afternoon! My name is Coral Ceo the PharmD on the MM team. I just conducted a Phone visit and her BP is still rather elevated.  12/12/20: 195/95 12/11/20: 185/85 12/10/20: 175/75   Is there anything we could do to alter therapy? Would you be able to do a Phone Visit with her before her November scheduled visit?  Thanks!!

## 2020-12-13 ENCOUNTER — Ambulatory Visit (INDEPENDENT_AMBULATORY_CARE_PROVIDER_SITE_OTHER): Payer: Medicaid Other | Admitting: Family Medicine

## 2020-12-13 ENCOUNTER — Telehealth: Payer: Self-pay | Admitting: Podiatry

## 2020-12-13 ENCOUNTER — Encounter: Payer: Self-pay | Admitting: Family Medicine

## 2020-12-13 ENCOUNTER — Other Ambulatory Visit: Payer: Self-pay

## 2020-12-13 DIAGNOSIS — I10 Essential (primary) hypertension: Secondary | ICD-10-CM | POA: Diagnosis not present

## 2020-12-13 MED ORDER — AMLODIPINE BESYLATE 5 MG PO TABS: 10.0000 mg | ORAL_TABLET | Freq: Every day | ORAL | 1 refills | Status: DC

## 2020-12-13 NOTE — Telephone Encounter (Signed)
Left message for patient to call office and schedule appointment.

## 2020-12-13 NOTE — Telephone Encounter (Signed)
Patient called stating she has Medicaid and she needs a Prior Auth for her MRI per her insurance.

## 2020-12-13 NOTE — Telephone Encounter (Signed)
She can come in for an appointment to have an A1c drawn. We do not know the accuracy of any OTC ones and I do not recommend them. I'm pretty sure we already recommended her to come in sooner earlier this week.

## 2020-12-13 NOTE — Telephone Encounter (Signed)
Her a1c was drawn a month ago and it was at 5.9 is there a feed for her to have it drawn again this early?

## 2020-12-13 NOTE — Assessment & Plan Note (Signed)
Not under good control. Will increase her amlodipine to 10mg  and recheck 4-6 weeks. Call with any concerns.

## 2020-12-13 NOTE — Telephone Encounter (Signed)
Routing to provider as an FYI

## 2020-12-13 NOTE — Progress Notes (Signed)
BP (!) 185/85   Pulse 80   LMP 06/09/2017 (Approximate)    Subjective:    Patient ID: Cheryl Chandler, female    DOB: 11-04-1965, 55 y.o.   MRN: [id number]  HPI: Cheryl Chandler is a 55 y.o. female  Chief Complaint  Patient presents with   Hypertension    Pt states she has been getting elevated BP readings at home   HYPERTENSION Hypertension status: uncontrolled  Satisfied with current treatment? no Duration of hypertension: chronic BP monitoring frequency:  a few times a week BP range: 180s-190s systolic BP medication side effects:  no Medication compliance: excellent compliance Previous BP meds:amlodipine, carvedilol, lasix, lisinopril Aspirin: no Recurrent headaches: no Visual changes: no Palpitations: no Dyspnea: no Chest pain: no Lower extremity edema: yes Dizzy/lightheaded: no  Relevant past medical, surgical, family and social history reviewed and updated as indicated. Interim medical history since our last visit reviewed. Allergies and medications reviewed and updated.  Review of Systems  Constitutional: Negative.   Respiratory: Negative.    Cardiovascular: Negative.   Gastrointestinal: Negative.   Musculoskeletal: Negative.   Neurological: Negative.   Psychiatric/Behavioral: Negative.     Per HPI unless specifically indicated above     Objective:    BP (!) 185/85   Pulse 80   LMP 06/09/2017 (Approximate)   Wt Readings from Last 3 Encounters:  11/20/20 290 lb (131.5 kg)  08/05/20 300 lb (136.1 kg)  12/06/19 (!) 316 lb (143.3 kg)    Physical Exam Vitals and nursing note reviewed.  Pulmonary:     Effort: Pulmonary effort is normal. No respiratory distress.     Comments: Speaking in full sentences Neurological:     Mental Status: She is alert.  Psychiatric:        Mood and Affect: Mood normal.        Behavior: Behavior normal.        Thought Content: Thought content normal.        Judgment: Judgment normal.    Results for  orders placed or performed during the hospital encounter of 11/20/20  CBC  Result Value Ref Range   WBC 10.6 (H) 4.0 - 10.5 K/uL   RBC 4.40 3.87 - 5.11 MIL/uL   Hemoglobin 13.4 12.0 - 15.0 g/dL   HCT 62.6 94.8 - 54.6 %   MCV 91.6 80.0 - 100.0 fL   MCH 30.5 26.0 - 34.0 pg   MCHC 33.3 30.0 - 36.0 g/dL   RDW 27.0 35.0 - 09.3 %   Platelets 341 150 - 400 K/uL   nRBC 0.0 0.0 - 0.2 %  Basic metabolic panel  Result Value Ref Range   Sodium 138 135 - 145 mmol/L   Potassium 3.9 3.5 - 5.1 mmol/L   Chloride 100 98 - 111 mmol/L   CO2 26 22 - 32 mmol/L   Glucose, Bld 99 70 - 99 mg/dL   BUN 16 6 - 20 mg/dL   Creatinine, Ser 8.18 0.44 - 1.00 mg/dL   Calcium 8.6 (L) 8.9 - 10.3 mg/dL   GFR, Estimated >29 >93 mL/min   Anion gap 12 5 - 15  Lactic acid, plasma  Result Value Ref Range   Lactic Acid, Venous 1.9 0.5 - 1.9 mmol/L      Assessment & Plan:   Problem List Items Addressed This Visit       Cardiovascular and Mediastinum   Essential hypertension    Not under good control. Will increase her amlodipine to 10mg  and  recheck 4-6 weeks. Call with any concerns.      Relevant Medications   amLODipine (NORVASC) 5 MG tablet     Follow up plan: Return 4-6 weeks with PCP for follow up blood pressure.   This visit was completed via telephone due to the restrictions of the COVID-19 pandemic. All issues as above were discussed and addressed but no physical exam was performed. If it was felt that the patient should be evaluated in the office, they were directed there. The patient verbally consented to this visit. Patient was unable to complete an audio/visual visit due to Lack of equipment. Due to the catastrophic nature of the COVID-19 pandemic, this visit was done through audio contact only. Location of the patient: home Location of the provider: work Those involved with this call:  Provider: Olevia Perches, DO CMA: Tristan Schroeder, CMA Front Desk/Registration: Kandice Hams  Time spent on  call:  15 minutes on the phone discussing health concerns. 23 minutes total spent in review of patient's record and preparation of their chart.

## 2020-12-13 NOTE — Telephone Encounter (Signed)
Pt called and can do a phone visit today if Dr Laural Benes can call her. No appts today.  Pt states she can do a call at any time.

## 2020-12-13 NOTE — Telephone Encounter (Signed)
Do they make an A1C tester for patient's to have at home?

## 2020-12-13 NOTE — Telephone Encounter (Signed)
Patient called in but does not want to schedule an appointment at this time due having some financial issues. May call back in September to schedule if she have some money. Please be advised Ph#  [phone]

## 2020-12-13 NOTE — Telephone Encounter (Signed)
No- but she needs an appointment for uncontrolled blood pressure

## 2020-12-14 DIAGNOSIS — Z993 Dependence on wheelchair: Secondary | ICD-10-CM | POA: Diagnosis not present

## 2020-12-14 DIAGNOSIS — R3981 Functional urinary incontinence: Secondary | ICD-10-CM | POA: Diagnosis not present

## 2020-12-16 ENCOUNTER — Telehealth: Payer: Self-pay

## 2020-12-16 NOTE — Telephone Encounter (Signed)
Spoke with [facility] representative and she confirmed that Cheryl Chandler is in network.  They also say that referrals do not need authorized.  Informed patient of findings.

## 2020-12-16 NOTE — Telephone Encounter (Signed)
Copied from CRM [id number]. Topic: General - Other >> Dec 16, 2020 12:44 PM Wyonia Hough E wrote: Reason for CRM: Pt has [facility] and received a bill  for $260 and $175 for visits with [name] on 7.27.22 and 8.10.22 and stated she was out of network/ she was advised by insurance that she may have to find another provider/ pt stated they need the office to call them to verify if [name] is in network / please call [phone] to verify and call pt to advise

## 2020-12-17 ENCOUNTER — Encounter (INDEPENDENT_AMBULATORY_CARE_PROVIDER_SITE_OTHER): Payer: Medicaid Other

## 2020-12-26 ENCOUNTER — Other Ambulatory Visit: Payer: Self-pay | Admitting: Obstetrics and Gynecology

## 2020-12-26 ENCOUNTER — Other Ambulatory Visit: Payer: Self-pay

## 2020-12-26 DIAGNOSIS — Z419 Encounter for procedure for purposes other than remedying health state, unspecified: Secondary | ICD-10-CM | POA: Diagnosis not present

## 2020-12-26 NOTE — Patient Instructions (Signed)
Visit Information  Cheryl Chandler was given information about Medicaid Managed Care team care coordination services as a part of their [facility] Medicaid benefit. Cheryl Chandler verbally consented to engagement with the [facility] Managed Care team.   If you are experiencing a medical emergency, please call 911 or report to your local emergency department or urgent care.   If you have a non-emergency medical problem during routine business hours, please contact your provider's office and ask to speak with a nurse.   For questions related to your [facility] health plan, please call: [phone] or go here:[email or web address]  If you would like to schedule transportation through your [facility] plan, please call the following number at least 2 days in advance of your appointment: [phone].  Call the [facility] at [phone], at any time, 24 hours a day, 7 days a week. If you are in danger or need immediate medical attention call 911.  If you would like help to quit smoking, call 1-800-QUIT-NOW (707) 701-9507) OR Espaol: 1-855-Djelo-Ya (4-580-998-3382) o para ms informacin haga clic aqu or Text READY to 200-400 to register via text  Ms. Kumagai - following are the goals we discussed in your visit today:   Goals Addressed             This Visit's Progress    RNCM: Keep Skin Clean and Dry       Follow Up Date: 30 to 60 days  - clean and dry skin well - keep skin dry - use a fragrance-free lotion on skin - wear a protective pad or garment    Why is this important?   Leaking urine (pee) can cause soreness from skin rashes and redness.  This is caused by skin being exposed to urine (pee).    12-02-2020: The patient states she currently has a wound on the bottom of her right foot. She was seen in the ER on 11-20-2020. The patient is currently taking antibiotics. She has a follow up with the pcp on 12-04-2020. Advised the patient to  keep appointment with pcp and got back to the ER for fever, purulent drainage, odor, or changes in wound. The patient verbalized understanding.   (/1/22:  patient has appointment with Dr. Posey Pronto 01/07/21 for follow up-still taking antibiotics.                RNCM: Track and Manage My Symptoms-Depression       Timeframe:  Long-Range Goal Priority:  High Start Date:    12-02-2020                         Expected End Date:   ongoing                Follow Up Date:  01/25/21   - avoid negative self-talk - develop a personal safety plan - develop a plan to deal with triggers like holidays, anniversaries - exercise at least 2 to 3 times per week - have a plan for how to handle bad days - journal feelings and what helps to feel better or worse - spend time or talk with others at least 2 to 3 times per week - spend time or talk with others every day - watch for early signs of feeling worse    Why is this important?   Keeping track of your progress will help your treatment team find the right mix of medicine and therapy for you.  Write in your  journal every day.  Day-to-day changes in depression symptoms are normal. It may be more helpful to check your progress at the end of each week instead of every day.     Notes: 12-02-2020: The patient states she is having a difficult time because of all the health conditions she is faced with right now. She has a wound on her right foot and is currently taking abx.  She hasn't felt well. She comes back to see the pcp on 12-04-2020. Education on working with the CCM team to help manage health and well being.  Update 12/26/20:  patient has appointment with LCSW 01/01/21.     RNCM: Track and Manage My Triggers-COPD       Timeframe:  Long-Range Goal Priority:  Medium Start Date:     05-22-2020                        Expected End Date:         05-22-2021              Follow Up Date 30 to 60 days   - avoid second hand smoke - eliminate smoking in my home - identify  and avoid work-related triggers - identify and remove indoor air pollutants - limit outdoor activity during cold weather - listen for public air quality announcements every day    Why is this important?   Triggers are activities or things, like tobacco smoke or cold weather, that make your COPD (chronic obstructive pulmonary disease) flare-up.  Knowing these triggers helps you plan how to stay away from them.  When you cannot remove them, you can learn how to manage them.     12-02-2020: The patient states the ER doctor told her that she needs oxygen. The patient states that she has been taking her oxygen levels and they have been in the 80's. Recommended the patient to write down values and bring to the next pcp visit.  Update 12/26/20:  patient continues to check oxygen saturations-WNL so far.     The patient verbalized understanding of instructions provided today and declined a print copy of patient instruction materials.   The Managed Medicaid care management team will reach out to the patient again over the next 30 days.  The  Patient   has been provided with contact information for the Managed Medicaid care management team and has been advised to call with any health related questions or concerns.   Aida Raider RN, BSN [place]  [facility] - Managed Medicaid High Risk [phone].   Following is a copy of your plan of care:   Patient Care Plan: RNCM: COPD (Adult)     Problem Identified: RNCM: Psychological Adjustment to Diagnosis (COPD)   Priority: Medium  Onset Date: 05/22/2020     Long-Range Goal: RNCM: Adjustment to Disease Achieved   Start Date: 05/22/2020  Expected End Date: 03/27/2022  Recent Progress: On track  Priority: High  Note:   Current Barriers:  Knowledge deficits related to basic understanding of COPD disease process Knowledge deficits related to basic COPD self care/management Knowledge deficit related to basic  understanding of how to use inhalers and how inhaled medications work Knowledge deficit related to importance of energy conservation Limited Social Support Unable to independently manage COPD Lacks social connections Does not contact provider office for questions/concerns  Case Manager Clinical Goal(s): patient will report using inhalers as prescribed including rinsing mouth after use patient  will be able to verbalize understanding of COPD action plan and when to seek appropriate levels of medical care  patient will engage in lite exercise as tolerated to build/regain stamina and strength and reduce shortness of breath through activity tolerance  patient will verbalize basic understanding of COPD disease process and self care activities  Interventions:  Collaboration with [name], NP regarding development and update of comprehensive plan of care as evidenced by provider attestation and co-signature Inter-disciplinary care team collaboration (see longitudinal plan of care) UNABLE to independently: manage COPD Provided patient with basic written and verbal COPD education on self care/management/and exacerbation prevention. 12-02-2020: The patient states she is not feeling well. Was in the ER recently for an infection in her right foot. The patient states that she needs to go to the wound clinic but her insurance will not pay for it. Has a follow up with the pcp on 12-04-2020. Advised the patient to discuss with the pcp her options and also talk about her change in her breathing. The patient has been taking her pulse ox readings and states that her levels have been low and in the 80's. The patient denies any acute breathing issues today but feels her health is not in the best place right now.  Update 12/26/20:  Patient with no breathing complaint today-checks oxygen saturations as needed. Provided patient with COPD action plan and reinforced importance of daily self assessment. 12-02-2020: Education  and support given.  Provided written and verbal instructions on pursed lip breathing and utilized returned demonstration as teach back Provided instruction about proper use of medications used for management of COPD including inhalers Advised patient to self assesses COPD action plan zone and make appointment with provider if in the yellow zone for 48 hours without improvement. Provided patient with education about the role of exercise in the management of COPD Advised patient to engage in light exercise as tolerated 3-5 days a week Provided education about and advised patient to utilize infection prevention strategies to reduce risk of respiratory infection. 12-02-2020: The patient denies any pulmonary infections, is currently dealing with a foot infections. Is on abx and will follow up with the pcp on 12-04-2020.  Patient Goals/Self-Care Activities:  - decision-making supported - depression screen reviewed - emotional support provided - family involvement promoted - problem-solving facilitated - relaxation techniques promoted - verbalization of feelings encouraged Follow Up Plan: Telephone follow up appointment with care management team member scheduled for: 12-02-2020: The patient transitioning to the [facility] team and will be followed by the CCM team for [facility]. The patient knows to expect a call from the scheduler to schedule with [facility]    Task: RNCM: Support Psychosocial Response to Chronic Obstructive Pulmonary Disease Completed 12/02/2020  Outcome: Positive  Note:   Care Management Activities:    - decision-making supported - depression screen reviewed - emotional support provided - family involvement promoted - problem-solving facilitated - relaxation techniques promoted - verbalization of feelings encouraged      Problem Identified: RNCM: Symptom Exacerbation (COPD)   Priority: High  Onset Date: 05/22/2020     Long-Range Goal: RNCM: Smoking Cessation Symptom Exacerbation Prevented or  Minimized   Start Date: 05/22/2020  Expected End Date: 03/27/2022  Recent Progress: Not on track  Priority: High  Note:   Current Barriers:  Unable to independently stop smoking  Does not adhere to provider recommendations re: smoking cessation  Lacks social connections Does not contact provider office for questions/concerns Tobacco abuse of >40 years; currently  smoking 1 ppd per week Previous quit attempts, unsuccessful several successful using patches- has a pattern of stopping and then starting back  Reports smoking within 30 minutes of waking up Reports triggers to smoke include: stress, chronic conditions  Reports motivation to quit smoking includes: knows it will help her breathing and other chronic conditions  On a scale of 1-10, reports MOTIVATION to quit is 8 On a scale of 1-10, reports CONFIDENCE in quitting is 7 Clinical Goal(s):  patient will work with RN Case Engineer, civil (consulting) and provider towards tobacco cessation  Interventions: Collaboration with [name], NP regarding development and update of comprehensive plan of care as evidenced by provider attestation and co-signature Inter-disciplinary care team collaboration (see longitudinal plan of care) Evaluation of current treatment plan reviewed. 12-02-2020: The patient is under a lot of stress and says smoking is what she does to  help with her stress relief. Update 12/26/20:  Patient smoking 4-5 cigarettes a day, trying to quit, discussed contacting PCP.  Provided contact information for [place] [facility] (1-800-QUIT-NOW). Patient will outreach this group for support. Discussed plans with patient for ongoing care management follow up and provided patient with direct contact information for care management team Provided patient with printed smoking cessation educational materials Provided contact information for [place] [facility]uit [facility] (1-800-QUIT-NOW). Patient will outreach this group for support. Evaluation of  current treatment plan reviewed-12-02-2020:  currently the patient is cutting back but wants to quit. Patient knows she can not smoke and use oxygen.  Patient Goals/Self-Care Activities patient will:  - mindfulness, talking to family and friends as a habit during cravings  - verbally commit to reducing tobacco consumption - barriers to lifestyle changes reviewed and addressed - barriers to treatment reviewed and addressed - breathing techniques encouraged - modification of home and work environment promoted - rescue (action) plan reviewed - signs/symptoms of infection reviewed - signs/symptoms of worsening disease assessed - treatment plan reviewed Follow Up Plan: Telephone follow up appointment with care management team member scheduled for:The patient is transitioning to the [facility] team and will be followed by new RNCM. The patient accepts transfer today. 12-02-2020    Task: RNCM: Identify and Minimize Risk of COPD Exacerbation Completed 12/02/2020  Outcome: Positive  Note:   Care Management Activities:    - barriers to lifestyle changes reviewed and addressed - barriers to treatment reviewed and addressed - breathing techniques encouraged - modification of home and work environment promoted - rescue (action) plan reviewed - signs/symptoms of infection reviewed - signs/symptoms of worsening disease assessed - treatment plan reviewed      Patient Care Plan: RNCM: Hypertension (Adult)     Problem Identified: RNCM: Hypertension (Hypertension)   Priority: Medium  Onset Date: 05/22/2020     Long-Range Goal: RNCM: Hypertension Monitored   Start Date: 05/22/2020  Expected End Date: 03/27/2022  Recent Progress: On track  Priority: Medium  Note:   Objective:  Last practice recorded BP readings:  BP Readings from Last 3 Encounters:  11/20/20 (!) 154/68  11/20/20 113/60  10/30/20 126/67    Most recent eGFR/CrCl: No results found for: EGFR  No components found for: CRCL Current  Barriers:  Knowledge Deficits related to basic understanding of hypertension pathophysiology and self care management Knowledge Deficits related to understanding of medications prescribed for management of hypertension Limited Social Support Unable to independently manage HTN Lacks social connections Does not contact provider office for questions/concerns Case Manager Clinical Goal(s):   patient will verbalize understanding  of plan for hypertension management patient will demonstrate improved adherence to prescribed treatment plan for hypertension as evidenced by taking all medications as prescribed, monitoring and recording blood pressure as directed, adhering to low sodium/DASH diet patient will demonstrate improved health management independence as evidenced by checking blood pressure as directed and notifying PCP if SBP>160 or DBP > 90, taking all medications as prescribe, and adhering to a low sodium diet as discussed. Interventions:  Collaboration with [name], NP regarding development and update of comprehensive plan of care as evidenced by provider attestation and co-signature Inter-disciplinary care team collaboration (see longitudinal plan of care) Evaluation of current treatment plan related to hypertension self management and patient's adherence to plan as established by provider. 12-02-2020: The patient has been very stressed out and states that she feels her health is out of control. She says she was supposed to go have an ECHO but she has not been able to do so because she was in the ER recently and has an infection on the bottom of her right foot. She states she is still taking abx for the infection. Has a follow up with pcp on 12-04-2020. The patient advised to seek emergent help for changes in her condition.  Update 12/26/20:  patient checking blood pressure daily-WNL today. Provided education to patient re: stroke prevention, s/s of heart attack and stroke, DASH diet,  complications of uncontrolled blood pressure Reviewed medications with patient and discussed importance of compliance. 12-02-2020: States compliance in medications. Is worried about getting what she needs to care for herself.  Discussed plans with patient for ongoing care management follow up and provided patient with direct contact information for care management team Advised patient, providing education and rationale, to monitor blood pressure daily and record, calling PCP for findings outside established parameters.  Patient Goals/Self-Care Activities Over the next 120 days, patient will:  - Self administers medications as prescribed Attends all scheduled provider appointments Calls provider office for new concerns, questions, or BP outside discussed parameters Checks BP and records as discussed Follows a low sodium diet/DASH diet - blood pressure trends reviewed - depression screen reviewed - home or ambulatory blood pressure monitoring encouraged Follow Up Plan: Telephone follow up appointment with care management team member scheduled for:  Transitioning to [facility] team. Will follow up with them at scheduled time.     Task: RNCM: Identify and Monitor Blood Pressure Elevation Completed 12/02/2020  Outcome: Positive  Note:   Care Management Activities:    - blood pressure trends reviewed - depression screen reviewed - home or ambulatory blood pressure monitoring encouraged     Patient Care Plan: RNCM: Depression (Adult)     Problem Identified: RNCM: Symptoms (Depression)   Priority: High  Onset Date: 12/02/2020     Long-Range Goal: RNCM: Symptoms Monitored and Managed   Start Date: 12/02/2020  Expected End Date: 03/27/2022  Recent Progress: Not on track  Priority: High  Note:   Current Barriers:  Ineffective Self Health Maintenance in a patient with Anxiety and Depression Unable to independently manage depression and anxiety as evidence of multiple chronic conditions and the  patient feeling her health is out of control  Does not attend all scheduled provider appointments Lacks social connections Unable to perform IADLs independently Does not contact provider office for questions/concerns Clinical Goal(s):  Collaboration with [name], NP regarding development and update of comprehensive plan of care as evidenced by provider attestation and co-signature Inter-disciplinary care team collaboration (see longitudinal plan of care) patient  will work with care management team to address care coordination and chronic disease management needs related to Disease Management Educational Needs Care Coordination Mental Health Counseling Level of Care Concerns   Interventions:  Evaluation of current treatment plan related to Anxiety and Depression, Financial constraints related to insurance not approving care for the patient to go to wound care , Limited social support, Level of care concerns, ADL IADL limitations, Mental Health Concerns , Family and relationship dysfunction, Substance abuse issues -  smoker , and Inability to perform IADL's independently self-management and patient's adherence to plan as established by provider. Collaboration with [name], NP regarding development and update of comprehensive plan of care as evidenced by provider attestation       and co-signature Inter-disciplinary care team collaboration (see longitudinal plan of care) Discussed plans with patient for ongoing care management follow up and provided patient with direct contact information for care management team Evaluation of depression and anxiety. The patient is having a hard time and just has not felt well recently. Has an infection in her right foot and was in the ER on 11-20-2020. The patient states that she is taking antibiotics but feels like things are not going well with her over all health and well being. Denies SI. The patient talked about family dysfunction and being  stressed over her family and things they are doing and saying to her. Has support from her husband but he has his own health conditions. Empathetic listening and support given. Will benefit from continued CCM services throught the [facility] care management team. Disussed transition to the [facility] team today and the patient agress to transition.  Update 11/25/20:  RNCM provided active listening-patient has follow up appointment with LCSW 01/01/21. Self Care Activities:  Patient verbalizes understanding of plan to effectively manage depression and anxiety Self administers medications as prescribed Attends all scheduled provider appointments Calls pharmacy for medication refills Attends church or other social activities Performs ADL's independently Performs IADL's independently Calls provider office for new concerns or questions Work with the [facility] team to meet needs for continued CCM services  Patient Goals: - activity or exercise based on tolerance encouraged - depression screen reviewed - emotional support provided - healthy lifestyle promoted - medication side effects monitored and managed - pain managed - participation in mental health treatment encouraged - quality of sleep assessed - response to mental health treatment monitored - response to pharmacologic therapy monitored - sleep hygiene techniques encouraged - social activities and relationships encouraged - substance use assessed Follow Up Plan: The care management team will reach out to the patient again over the next 30 to 60 days.  The patient transitioning over to the [facility] team    Task: Alleviate Barriers to Depression Treatment Completed 12/02/2020  Outcome: Positive  Note:   Care Management Activities:    - activity or exercise based on tolerance encouraged - depression screen reviewed - emotional support provided - healthy lifestyle promoted - medication side effects monitored and managed - pain managed - participation in  mental health treatment encouraged - quality of sleep assessed - response to mental health treatment monitored - response to pharmacologic therapy monitored - sleep hygiene techniques encouraged - social activities and relationships encouraged - substance use assessed

## 2020-12-26 NOTE — Patient Outreach (Addendum)
Medicaid Managed Care   Nurse Care Manager Note  12/26/2020 Name:  Cheryl Chandler MRN:  [id number] DOB:  1966-04-22  Cheryl Chandler is an 55 y.o. year old female who is a primary patient of [name], NP.  [facility] Managed Care Coordination team was consulted for assistance with:    Chronic healthcare management needs  Ms. Boston was given information about Medicaid Managed Care Coordination team services today. [place] Patient agreed to services and verbal consent obtained.  Engaged with patient by telephone for initial visit in response to provider referral for case management and/or care coordination services.   Assessments/Interventions:  Review of past medical history, allergies, medications, health status, including review of consultants reports, laboratory and other test data, was performed as part of comprehensive evaluation and provision of chronic care management services.  SDOH (Social Determinants of Health) assessments and interventions performed:   Care Plan  Allergies  Allergen Reactions   Codeine Shortness Of Breath and Rash   Percocet [Oxycodone-Acetaminophen] Shortness Of Breath   Sulfa Antibiotics Shortness Of Breath and Rash   Aspirin Hives   Bee Venom     Bee stings   Darvon [Propoxyphene]     Darvocet-N 100   Latex    Oxycodone Nausea And Vomiting   Penicillins     Has patient had a PCN reaction causing immediate rash, facial/tongue/throat swelling, SOB or lightheadedness with hypotension: Unknown Has patient had a PCN reaction causing severe rash involving mucus membranes or skin necrosis: Unknown Has patient had a PCN reaction that required hospitalization: Unknown Has patient had a PCN reaction occurring within the last 10 years: Unknown If all of the above answers are "NO", then may proceed with Cephalosporin use.    Medications Reviewed Today     Reviewed by [name], RN (Registered Nurse) on 12/26/20  at 85  Med List Status: <None>   Medication Order Taking? Sig Documenting Provider Last Dose Status Informant  ACCU-CHEK GUIDE test strip [id number] No  [provider] Taking Active   Accu-Chek Softclix Lancets lancets [id number] No CHECK BLOOD SUGAR TWICE DAILY [name], NP Taking Active   albuterol (PROVENTIL) (2.5 MG/3ML) 0.083% nebulizer solution [id number] No INHALE CONTENTS OF 1 VIAL IN NEBULIZER EVERY 4 HOURS AS NEEDED [name], NP Taking Active   amLODipine (NORVASC) 5 MG tablet [id number]  Take 2 tablets (10 mg total) by mouth daily. Johnson, [name], DO  Active   atorvastatin (LIPITOR) 80 MG tablet [id number] No Take 1 tablet (80 mg total) by mouth daily. [name], NP Taking Active   Blood Glucose Monitoring Suppl (ACCU-CHEK GUIDE) w/Device KIT [id number] No Use to check blood sugar 3 times a day. [name], NP Taking Active   carvedilol (COREG) 3.125 MG tablet [id number] No Take 1 tablet (3.125 mg total) by mouth 2 (two) times daily with a meal. [name], DO Taking Active   citalopram (CELEXA) 20 MG tablet [id number] No TAKE 1 TABLET BY MOUTH ONCE DAILY [name], NP Taking Active   EPINEPHrine 0.3 mg/0.3 mL IJ SOAJ injection [id number] No USE AS DIRECTED. [name], NP Taking Active   furosemide (LASIX) 20 MG tablet [id number] No TAKE 1 TABLET BY MOUTH ONCE DAILY IN AFTERNOON AS NEEDED LEG EDEMA [name], NP Taking Active   furosemide (LASIX) 40 MG tablet [id number] No Take 1 tablet (40 mg total) by mouth daily. [name], [place] Taking Active  Med Note [name], [name] Oct 14, 2020  1:41 PM) Told to take in AM, not afternoon  gabapentin (NEURONTIN) 300 MG capsule [id number] No TAKE 1 CAPSULE BY MOUTH 3 TIMES DAILY ASNEEDED (NEED OFFICE VISIT FOR FURTHER REFILLS) [name], NP Taking Active   levothyroxine (SYNTHROID) 50 MCG tablet [id number] No TAKE 1 TABLET BY MOUTH ONCE DAILY ON AN  EMPTY STOMACH. WAIT 30 MINUTES BEFORE TAKING OTHER MEDS. [name], NP Taking Active   lisinopril (ZESTRIL) 40 MG tablet [id number] No TAKE 1 TABLET BY MOUTH ONCE DAILY [name], NP Taking Active   meloxicam (MOBIC) 15 MG tablet [id number] No TAKE 1 TABLET BY MOUTH ONCE DAILY AS NEEDED WITH FOOD OR MILK [name], NP Taking Active   methocarbamol (ROBAXIN) 750 MG tablet [id number] No TAKE 1-2 TABLETS BY MOUTH 3 TIMES DAILY AS NEEDED [name], [name], NP Taking Active   mometasone-formoterol (DULERA) 100-5 MCG/ACT AERO [id number] No INHALE 2 PUFFS BY MOUTH TWICE A DAY. RINSE MOUTH AFTER USE. [name], PA-C Taking Active   montelukast (SINGULAIR) 10 MG tablet [id number] No Take 1 tablet (10 mg total) by mouth at bedtime. [name], NP Taking Active   nitroGLYCERIN (NITROSTAT) 0.4 MG SL tablet [id number] No DISSOLVE 1 TABLET UNDER TONGUE AT ONSET OF CHEST PAIN. REPEAT IN 5 MIN IF NOT RESOLVED, MAX 3 DOSES. 911 IF NEEDED. [name], NP Taking Active   omeprazole (PRILOSEC) 20 MG capsule [id number] No Take 1 capsule (20 mg total) by mouth daily. [name], NP Taking Active   QUEtiapine (SEROQUEL) 25 MG tablet [id number] No TAKE 1 TABLET BY MOUTH AT BEDTIME [name], NP Taking Active   SPIRIVA HANDIHALER 18 MCG inhalation capsule [id number] No INHALE CONTENTS OF 1 CAPSULE ONCE DAILY [name], NP Taking Active   triamcinolone cream (KENALOG) 0.1 % [id number] No APPLY 1 APPLICATION TOPICALLY TWICE DAILY [name], NP Taking Active   TRULICITY 1.24 PY/0.9XI SOPN [id number] No INJECT 0.75MG SUBQ ONCE A WEEK [name], NP Taking Active   Vitamin D, Ergocalciferol, (DRISDOL) 1.25 MG (50000 UNIT) CAPS capsule [id number] No TAKE 1 CAPSULE BY MOUTH ONCE EVERY 7 DAYS [name], NP Taking Active             Patient Active Problem List   Diagnosis Date Noted   Fibrocystic disease of both breasts 05/07/2019   IFG  (impaired fasting glucose) 05/03/2019   Chronic venous insufficiency 11/09/2018   Overactive bladder 09/30/2018   Peripheral neuropathy 09/30/2018   Apnea 01/02/2016   Lymphedema 11/30/2014   Dyspnea on exertion 11/30/2014   Anxiety 11/29/2014   Arthritis 11/29/2014   COPD (chronic obstructive pulmonary disease) ([place]) 11/29/2014   Clinical depression 11/29/2014   H/O eating disorder 11/29/2014   Esophageal reflux 11/29/2014   Acne inversa 11/29/2014   Essential hypertension 11/29/2014   HLD (hyperlipidemia) 11/29/2014   Adult hypothyroidism 11/29/2014   Insomnia 11/29/2014   Low back pain 11/29/2014   Morbid obesity ([place]) 11/29/2014   Posttraumatic stress disorder 11/29/2014   Vitamin D deficiency 11/29/2014   Nicotine dependence, cigarettes, uncomplicated 90/24/0973    Conditions to be addressed/monitored per PCP order:   chronic healthcare management needs, tobacco use, COPD, anxiety, depression, reflux,  hypothyroidism, arthritis, overactive bladder,  h/o eating disorder, HLD, obesity, LBP, PTSD, lymphedema, sleep apnea, back pain, foot wound  Care Plan : RNCM: COPD (Adult)  Updates made by [name], RN since 12/26/2020 12:00 AM     Problem: RNCM:  Psychological Adjustment to Diagnosis (COPD)   Priority: Medium  Onset Date: 05/22/2020     Long-Range Goal: RNCM: Adjustment to Disease Achieved   Start Date: 05/22/2020  Expected End Date: 03/27/2022  Recent Progress: On track  Priority: High  Note:   Current Barriers:  Knowledge deficits related to basic understanding of COPD disease process Knowledge deficits related to basic COPD self care/management Knowledge deficit related to basic understanding of how to use inhalers and how inhaled medications work Knowledge deficit related to importance of energy conservation Limited Social Support Unable to independently manage COPD Lacks social connections Does not contact provider office for questions/concerns  Case  Manager Clinical Goal(s): patient will report using inhalers as prescribed including rinsing mouth after use patient will be able to verbalize understanding of COPD action plan and when to seek appropriate levels of medical care  patient will engage in lite exercise as tolerated to build/regain stamina and strength and reduce shortness of breath through activity tolerance  patient will verbalize basic understanding of COPD disease process and self care activities  Interventions:  Collaboration with [name], NP regarding development and update of comprehensive plan of care as evidenced by provider attestation and co-signature Inter-disciplinary care team collaboration (see longitudinal plan of care) UNABLE to independently: manage COPD Provided patient with basic written and verbal COPD education on self care/management/and exacerbation prevention. 12-02-2020: The patient states she is not feeling well. Was in the ER recently for an infection in her right foot. The patient states that she needs to go to the wound clinic but her insurance will not pay for it. Has a follow up with the pcp on 12-04-2020. Advised the patient to discuss with the pcp her options and also talk about her change in her breathing. The patient has been taking her pulse ox readings and states that her levels have been low and in the 80's. The patient denies any acute breathing issues today but feels her health is not in the best place right now.  Update 12/26/20:  Patient with no breathing complaint today-checks oxygen saturations as needed. Provided patient with COPD action plan and reinforced importance of daily self assessment. 12-02-2020: Education and support given.  Provided written and verbal instructions on pursed lip breathing and utilized returned demonstration as teach back Provided instruction about proper use of medications used for management of COPD including inhalers Advised patient to self assesses COPD action  plan zone and make appointment with provider if in the yellow zone for 48 hours without improvement. Provided patient with education about the role of exercise in the management of COPD Advised patient to engage in light exercise as tolerated 3-5 days a week Provided education about and advised patient to utilize infection prevention strategies to reduce risk of respiratory infection. 12-02-2020: The patient denies any pulmonary infections, is currently dealing with a foot infections. Is on abx and will follow up with the pcp on 12-04-2020.  Patient Goals/Self-Care Activities:  - decision-making supported - depression screen reviewed - emotional support provided - family involvement promoted - problem-solving facilitated - relaxation techniques promoted - verbalization of feelings encouraged Follow Up Plan: Telephone follow up appointment with care management team member scheduled for: 12-02-2020: The patient transitioning to the [facility] team and will be followed by the CCM team for [facility]. The patient knows to expect a call from the scheduler to schedule with HR[facility]    Problem: RNCM: Symptom Exacerbation (COPD)   Priority: High  Onset Date: 05/22/2020  Long-Range Goal: RNCM: Smoking Cessation Symptom Exacerbation Prevented or Minimized   Start Date: 05/22/2020  Expected End Date: 03/27/2022  Recent Progress: Not on track  Priority: High  Note:   Current Barriers:  Unable to independently stop smoking  Does not adhere to provider recommendations re: smoking cessation  Lacks social connections Does not contact provider office for questions/concerns Tobacco abuse of >40 years; currently smoking 1 ppd per week Previous quit attempts, unsuccessful several successful using patches- has a pattern of stopping and then starting back  Reports smoking within 30 minutes of waking up Reports triggers to smoke include: stress, chronic conditions  Reports motivation to quit smoking includes: knows it will  help her breathing and other chronic conditions  On a scale of 1-10, reports MOTIVATION to quit is 8 On a scale of 1-10, reports CONFIDENCE in quitting is 7 Clinical Goal(s):  patient will work with RN Case Engineer, civil (consulting) and provider towards tobacco cessation  Interventions: Collaboration with [name], NP regarding development and update of comprehensive plan of care as evidenced by provider attestation and co-signature Inter-disciplinary care team collaboration (see longitudinal plan of care) Evaluation of current treatment plan reviewed. 12-02-2020: The patient is under a lot of stress and says smoking is what she does to  help with her stress relief. Update 12/26/20:  Patient smoking 4-5 cigarettes a day, trying to quit, discussed contacting PCP.  Provided contact information for [place] [facility] (1-800-QUIT-NOW). Patient will outreach this group for support. Discussed plans with patient for ongoing care management follow up and provided patient with direct contact information for care management team Provided patient with printed smoking cessation educational materials Provided contact information for [place] [facility] (1-800-QUIT-NOW). Patient will outreach this group for support. Evaluation of current treatment plan reviewed-12-02-2020:  currently the patient is cutting back but wants to quit. Patient knows she can not smoke and use oxygen.  Patient Goals/Self-Care Activities patient will:  - mindfulness, talking to family and friends as a habit during cravings  - verbally commit to reducing tobacco consumption - barriers to lifestyle changes reviewed and addressed - barriers to treatment reviewed and addressed - breathing techniques encouraged - modification of home and work environment promoted - rescue (action) plan reviewed - signs/symptoms of infection reviewed - signs/symptoms of worsening disease assessed - treatment plan reviewed Follow Up Plan: Telephone  follow up appointment with care management team member scheduled for:The patient is transitioning to the [facility] team and will be followed by new RNCM. The patient accepts transfer today. 12-02-2020    Care Plan : RNCM: Hypertension (Adult)  Updates made by [name], RN since 12/26/2020 12:00 AM     Problem: RNCM: Hypertension (Hypertension)   Priority: Medium  Onset Date: 05/22/2020     Long-Range Goal: RNCM: Hypertension Monitored   Start Date: 05/22/2020  Expected End Date: 03/27/2022  Recent Progress: On track  Priority: Medium  Note:   Objective:  Last practice recorded BP readings:  BP Readings from Last 3 Encounters:  11/20/20 (!) 154/68  11/20/20 113/60  10/30/20 126/67    Most recent eGFR/CrCl: No results found for: EGFR  No components found for: CRCL Current Barriers:  Knowledge Deficits related to basic understanding of hypertension pathophysiology and self care management Knowledge Deficits related to understanding of medications prescribed for management of hypertension Limited Social Support Unable to independently manage HTN Lacks social connections Does not contact provider office for questions/concerns Case Manager Clinical Goal(s):   patient  will verbalize understanding of plan for hypertension management patient will demonstrate improved adherence to prescribed treatment plan for hypertension as evidenced by taking all medications as prescribed, monitoring and recording blood pressure as directed, adhering to low sodium/DASH diet patient will demonstrate improved health management independence as evidenced by checking blood pressure as directed and notifying PCP if SBP>160 or DBP > 90, taking all medications as prescribe, and adhering to a low sodium diet as discussed. Interventions:  Collaboration with [name], NP regarding development and update of comprehensive plan of care as evidenced by provider attestation and co-signature Inter-disciplinary care  team collaboration (see longitudinal plan of care) Evaluation of current treatment plan related to hypertension self management and patient's adherence to plan as established by provider. 12-02-2020: The patient has been very stressed out and states that she feels her health is out of control. She says she was supposed to go have an ECHO but she has not been able to do so because she was in the ER recently and has an infection on the bottom of her right foot. She states she is still taking abx for the infection. Has a follow up with pcp on 12-04-2020. The patient advised to seek emergent help for changes in her condition.  Update 12/26/20:  patient checking blood pressure daily-WNL today. Provided education to patient re: stroke prevention, s/s of heart attack and stroke, DASH diet, complications of uncontrolled blood pressure Reviewed medications with patient and discussed importance of compliance. 12-02-2020: States compliance in medications. Is worried about getting what she needs to care for herself.  Discussed plans with patient for ongoing care management follow up and provided patient with direct contact information for care management team Advised patient, providing education and rationale, to monitor blood pressure daily and record, calling PCP for findings outside established parameters.  Patient Goals/Self-Care Activities Over the next 120 days, patient will:  - Self administers medications as prescribed Attends all scheduled provider appointments Calls provider office for new concerns, questions, or BP outside discussed parameters Checks BP and records as discussed Follows a low sodium diet/DASH diet - blood pressure trends reviewed - depression screen reviewed - home or ambulatory blood pressure monitoring encouraged Follow Up Plan: Telephone follow up appointment with care management team member scheduled for:  Transitioning to [facility] team. Will follow up with them at scheduled time.      Care Plan : RNCM: Depression (Adult)  Updates made by [name], RN since 12/26/2020 12:00 AM     Problem: RNCM: Symptoms (Depression)   Priority: High  Onset Date: 12/02/2020     Long-Range Goal: RNCM: Symptoms Monitored and Managed   Start Date: 12/02/2020  Expected End Date: 03/27/2022  Recent Progress: Not on track  Priority: High  Note:   Current Barriers:  Ineffective Self Health Maintenance in a patient with Anxiety and Depression Unable to independently manage depression and anxiety as evidence of multiple chronic conditions and the patient feeling her health is out of control  Does not attend all scheduled provider appointments Lacks social connections Unable to perform IADLs independently Does not contact provider office for questions/concerns Clinical Goal(s):  Collaboration with [name], NP regarding development and update of comprehensive plan of care as evidenced by provider attestation and co-signature Inter-disciplinary care team collaboration (see longitudinal plan of care) patient will work with care management team to address care coordination and chronic disease management needs related to Disease Management Educational Needs Care Coordination Mental Health Counseling Level of Care Concerns  Interventions:  Evaluation of current treatment plan related to Anxiety and Depression, Financial constraints related to insurance not approving care for the patient to go to wound care , Limited social support, Level of care concerns, ADL IADL limitations, Mental Health Concerns , Family and relationship dysfunction, Substance abuse issues -  smoker , and Inability to perform IADL's independently self-management and patient's adherence to plan as established by provider. Collaboration with [name], NP regarding development and update of comprehensive plan of care as evidenced by provider attestation       and co-signature Inter-disciplinary care team  collaboration (see longitudinal plan of care) Discussed plans with patient for ongoing care management follow up and provided patient with direct contact information for care management team Evaluation of depression and anxiety. The patient is having a hard time and just has not felt well recently. Has an infection in her right foot and was in the ER on 11-20-2020. The patient states that she is taking antibiotics but feels like things are not going well with her over all health and well being. Denies SI. The patient talked about family dysfunction and being stressed over her family and things they are doing and saying to her. Has support from her husband but he has his own health conditions. Empathetic listening and support given. Will benefit from continued CCM services throught the [facility] care management team. Disussed transition to the [facility] team today and the patient agress to transition.  Update 11/25/20:  RNCM provided active listening-patient has follow up appointment with LCSW 01/01/21. Self Care Activities:  Patient verbalizes understanding of plan to effectively manage depression and anxiety Self administers medications as prescribed Attends all scheduled provider appointments Calls pharmacy for medication refills Attends church or other social activities Performs ADL's independently Performs IADL's independently Calls provider office for new concerns or questions Work with the [facility] team to meet needs for continued CCM services  Patient Goals: - activity or exercise based on tolerance encouraged - depression screen reviewed - emotional support provided - healthy lifestyle promoted - medication side effects monitored and managed - pain managed - participation in mental health treatment encouraged - quality of sleep assessed - response to mental health treatment monitored - response to pharmacologic therapy monitored - sleep hygiene techniques encouraged - social activities and  relationships encouraged - substance use assessed Follow Up Plan: The care management team will reach out to the patient again over the next 30 to 60 days.  The patient transitioning over to the [facility] team     Follow Up:  Patient agrees to Care Plan and Follow-up.  Plan: The Managed Medicaid care management team will reach out to the patient again over the next 30 days. and The patient has been provided with contact information for the Managed Medicaid care management team and has been advised to call with any health related questions or concerns.  Date/time of next scheduled RN care management/care coordination outreach:  01/22/21 at 1245.

## 2020-12-27 ENCOUNTER — Other Ambulatory Visit: Payer: Self-pay | Admitting: Nurse Practitioner

## 2020-12-27 ENCOUNTER — Ambulatory Visit: Payer: Self-pay

## 2020-12-27 NOTE — Telephone Encounter (Signed)
Requested medications are due for refill today No  Requested medications are on the active medication list Yes  Last refill 12/27/20  Last visit 10/2020  Future visit scheduled 12/2020  Notes to clinic filled 90 days supply on 12/27/20, not a mail order pharmacy, requesting too early, please assess.

## 2020-12-31 ENCOUNTER — Ambulatory Visit: Payer: Medicaid Other | Admitting: Podiatry

## 2021-01-01 ENCOUNTER — Ambulatory Visit: Payer: Self-pay

## 2021-01-01 ENCOUNTER — Telehealth: Payer: Self-pay | Admitting: Licensed Clinical Social Worker

## 2021-01-01 NOTE — Patient Outreach (Signed)
[facility]) Care Management  01/01/2021  Armella Stogner Feb 21, 1966 [id number]  [facility] LCSW spoke with patient. Patient is really struggling with her knee right now and [facility] LCSW and patient thought it would be best to reschedule her appointment to 01/10/21 at 9:45 am before her PCP appointment at 2:00 pm.   [name], BSW, MSW, LCSW Managed Medicaid LCSW [facility]  [facility] [email or web address] Phone: [phone]

## 2021-01-01 NOTE — Patient Instructions (Signed)
Visit Information  Cheryl Chandler was given information about Medicaid Managed Care team care coordination services as a part of their [facility] Medicaid benefit. Cheryl Chandler verbally consented to engagement with the [facility] Managed Care team.   If you are experiencing a medical emergency, please call 911 or report to your local emergency department or urgent care.   If you have a non-emergency medical problem during routine business hours, please contact your provider's office and ask to speak with a nurse.   For questions related to your [facility] health plan, please call: [phone] or go here:[email or web address]  If you would like to schedule transportation through your [facility] plan, please call the following number at least 2 days in advance of your appointment: [phone].  Call the [facility] Crisis Line at [phone], at any time, 24 hours a day, 7 days a week. If you are in danger or need immediate medical attention call 911.  If you would like help to quit smoking, call 1-800-QUIT-NOW ((517)059-5254) OR Espaol: 1-855-Djelo-Ya (7-867-672-0947) o para ms informacin haga clic aqu or Text READY to 096-283 to register via text  Cheryl Chandler, BSW, MSW, [employer] Managed Medicaid LCSW [facility]  [facility] [email or web address] Phone: [phone]

## 2021-01-07 ENCOUNTER — Ambulatory Visit: Payer: Medicaid Other | Admitting: Podiatry

## 2021-01-09 ENCOUNTER — Ambulatory Visit: Payer: Medicaid Other | Admitting: Podiatry

## 2021-01-09 ENCOUNTER — Ambulatory Visit: Payer: Self-pay

## 2021-01-10 ENCOUNTER — Ambulatory Visit: Payer: Medicaid Other | Admitting: Nurse Practitioner

## 2021-01-13 DIAGNOSIS — R3981 Functional urinary incontinence: Secondary | ICD-10-CM | POA: Diagnosis not present

## 2021-01-13 DIAGNOSIS — Z993 Dependence on wheelchair: Secondary | ICD-10-CM | POA: Diagnosis not present

## 2021-01-15 ENCOUNTER — Other Ambulatory Visit: Payer: Self-pay | Admitting: Licensed Clinical Social Worker

## 2021-01-15 NOTE — Patient Instructions (Signed)
Visit Information  Cheryl Chandler was given information about Medicaid Managed Care team care coordination services as a part of their [facility] Medicaid benefit. Cheryl Chandler verbally consented to engagement with the [facility] Managed Care team.   If you are experiencing a medical emergency, please call 911 or report to your local emergency department or urgent care.   If you have a non-emergency medical problem during routine business hours, please contact your provider's office and ask to speak with a nurse.   For questions related to your [facility] health plan, please call: [phone] or go here:[email or web address]  If you would like to schedule transportation through your [facility] plan, please call the following number at least 2 days in advance of your appointment: [phone].  Call the [facility] Crisis Line at [phone], at any time, 24 hours a day, 7 days a week. If you are in danger or need immediate medical attention call 911.  If you would like help to quit smoking, call 1-800-QUIT-NOW (507-368-2632) OR Espaol: 1-855-Djelo-Ya (4-132-440-1027) o para ms informacin haga clic aqu or Text READY to 253-664 to register via text  Cheryl Chandler - following are the goals we discussed in your visit today:   Goals Addressed               This Visit's Progress     SW-"I need to focus on taking better care of myself" (pt-stated)        Timeframe:  Long-Range Goal Priority:  High Start Date:       11/07/20                      Expected End Date:  ongoing                Follow Up Date- 02/18/21   - begin personal counseling - call and visit an old friend - check out volunteer opportunities - join a support group - laugh; watch a funny movie or comedian - learn and use visualization or guided imagery - perform a random act of kindness - practice relaxation or meditation daily - start or continue a personal journal - talk about  feelings with a friend, family or spiritual advisor - practice positive thinking and self-talk    Why is this important?   When you are stressed, down or upset, your body reacts too.  For example, your blood pressure may get higher; you may have a headache or stomachache.  When your emotions get the best of you, your body's ability to fight off cold and flu gets weak.  These steps will help you manage your emotions.    Current Barriers:  Financial constraints Mental Health Concerns  Social Isolation Limited education about mental health support resources that are available to her within the local area* Cognitive Deficits Lacks knowledge of community resource: Grief support resources/programs that are offered free of charge  within the area  Clinical Social Work Clinical Goal(s):  Over the next 90 days, client will work with SW to address concerns related to experiencing ongoing symptoms of depression, sadness and grief since the passing of her mother Over the next 90 days, patient will work with LCSW to address needs related to implementing appropriate self-care and depression management.   -Patient will notify [facility] team of any concerns. Contact numbers and information provided to patient.          Cheryl Chandler, BSW, MSW, [employer] Managed Medicaid LCSW [facility]  [facility] [employer]  [email or web address] Phone: [phone]

## 2021-01-15 NOTE — Patient Outreach (Signed)
Medicaid Managed Care Social Work Note  01/15/2021 Name:  Cheryl Chandler MRN:  [id number] DOB:  1965/10/15  Cheryl Chandler is an 55 y.o. year old female who is a primary patient of [name], NP.  The Medicaid Managed Care Coordination team was consulted for assistance with:  [place] and Resources  [name] was given information about Medicaid Managed Care Coordination team services today. [place] Patient agreed to services and verbal consent obtained.  Engaged with patient  for by telephone forfollow up visit in response to referral for case management and/or care coordination services.   Assessments/Interventions:  Review of past medical history, allergies, medications, health status, including review of consultants reports, laboratory and other test data, was performed as part of comprehensive evaluation and provision of chronic care management services.  SDOH: (Social Determinant of Health) assessments and interventions performed: [name]    Flowsheet Row Most Recent Value  [name]   Stress Interventions Provide Counseling       Advanced Directives Status:  See Care Plan for related entries.  Care Plan                 Allergies  Allergen Reactions   Codeine Shortness Of Breath and Rash   Percocet [Oxycodone-Acetaminophen] Shortness Of Breath   Sulfa Antibiotics Shortness Of Breath and Rash   Aspirin Hives   Bee Venom     Bee stings   Darvon [Propoxyphene]     Darvocet-N 100   Latex    Oxycodone Nausea And Vomiting   Penicillins     Has patient had a PCN reaction causing immediate rash, facial/tongue/throat swelling, SOB or lightheadedness with hypotension: Unknown Has patient had a PCN reaction causing severe rash involving mucus membranes or skin necrosis: Unknown Has patient had a PCN reaction that required hospitalization: Unknown Has patient had a PCN reaction occurring within the last 10  years: Unknown If all of the above answers are "NO", then may proceed with Cephalosporin use.    Medications Reviewed Today     Reviewed by [name], RN (Registered Nurse) on 12/26/20 at 95  Med List Status: <None>   Medication Order Taking? Sig Documenting Provider Last Dose Status Informant  ACCU-CHEK GUIDE test strip [id number] No  [provider] Taking Active   Accu-Chek Softclix Lancets lancets [id number] No CHECK BLOOD SUGAR TWICE DAILY [name], NP Taking Active   albuterol (PROVENTIL) (2.5 MG/3ML) 0.083% nebulizer solution [id number] No INHALE CONTENTS OF 1 VIAL IN NEBULIZER EVERY 4 HOURS AS NEEDED [name], NP Taking Active   amLODipine (NORVASC) 5 MG tablet [id number]  Take 2 tablets (10 mg total) by mouth daily. Johnson, [name], DO  Active   atorvastatin (LIPITOR) 80 MG tablet [id number] No Take 1 tablet (80 mg total) by mouth daily. [name], NP Taking Active   Blood Glucose Monitoring Suppl (ACCU-CHEK GUIDE) w/Device KIT [id number] No Use to check blood sugar 3 times a day. [name], NP Taking Active   carvedilol (COREG) 3.125 MG tablet [id number] No Take 1 tablet (3.125 mg total) by mouth 2 (two) times daily with a meal. [name], DO Taking Active   citalopram (CELEXA) 20 MG tablet [id number] No TAKE 1 TABLET BY MOUTH ONCE DAILY [name], NP Taking Active   EPINEPHrine 0.3 mg/0.3 mL IJ SOAJ injection [id number] No USE AS DIRECTED. [name], NP Taking Active   furosemide (LASIX) 20 MG tablet [id number] No TAKE 1 TABLET BY  MOUTH ONCE DAILY IN AFTERNOON AS NEEDED LEG EDEMA [name], NP Taking Active   furosemide (LASIX) 40 MG tablet [id number] No Take 1 tablet (40 mg total) by mouth daily. [name], PA-C Taking Active            Med Note [name], [name] Oct 14, 2020  1:41 PM) Told to take in AM, not afternoon  gabapentin (NEURONTIN) 300 MG capsule [id number] No TAKE 1 CAPSULE BY  MOUTH 3 TIMES DAILY ASNEEDED (NEED OFFICE VISIT FOR FURTHER REFILLS) [name], NP Taking Active   levothyroxine (SYNTHROID) 50 MCG tablet [id number] No TAKE 1 TABLET BY MOUTH ONCE DAILY ON AN EMPTY STOMACH. WAIT 30 MINUTES BEFORE TAKING OTHER MEDS. [name], NP Taking Active   lisinopril (ZESTRIL) 40 MG tablet [id number] No TAKE 1 TABLET BY MOUTH ONCE DAILY [name], NP Taking Active   meloxicam (MOBIC) 15 MG tablet [id number] No TAKE 1 TABLET BY MOUTH ONCE DAILY AS NEEDED WITH FOOD OR MILK [name], NP Taking Active   methocarbamol (ROBAXIN) 750 MG tablet [id number] No TAKE 1-2 TABLETS BY MOUTH 3 TIMES DAILY AS NEEDED [name], [name], NP Taking Active   mometasone-formoterol (DULERA) 100-5 MCG/ACT AERO [id number] No INHALE 2 PUFFS BY MOUTH TWICE A DAY. RINSE MOUTH AFTER USE. [name], PA-C Taking Active   montelukast (SINGULAIR) 10 MG tablet [id number] No Take 1 tablet (10 mg total) by mouth at bedtime. [name], NP Taking Active   nitroGLYCERIN (NITROSTAT) 0.4 MG SL tablet [id number] No DISSOLVE 1 TABLET UNDER TONGUE AT ONSET OF CHEST PAIN. REPEAT IN 5 MIN IF NOT RESOLVED, MAX 3 DOSES. 911 IF NEEDED. [name], NP Taking Active   omeprazole (PRILOSEC) 20 MG capsule [id number] No Take 1 capsule (20 mg total) by mouth daily. [name], NP Taking Active   QUEtiapine (SEROQUEL) 25 MG tablet [id number] No TAKE 1 TABLET BY MOUTH AT BEDTIME [name], NP Taking Active   SPIRIVA HANDIHALER 18 MCG inhalation capsule [id number] No INHALE CONTENTS OF 1 CAPSULE ONCE DAILY [name], NP Taking Active   triamcinolone cream (KENALOG) 0.1 % [id number] No APPLY 1 APPLICATION TOPICALLY TWICE DAILY [name], NP Taking Active   TRULICITY 4.16 LA/4.5XM SOPN [id number] No INJECT 0.75MG SUBQ ONCE A [name], NP Taking Active   Vitamin D, Ergocalciferol, (DRISDOL) 1.25 MG (50000 UNIT) CAPS capsule [id number] No TAKE 1  CAPSULE BY MOUTH ONCE EVERY 7 DAYS [name], NP Taking Active             Patient Active Problem List   Diagnosis Date Noted   Fibrocystic disease of both breasts 05/07/2019   IFG (impaired fasting glucose) 05/03/2019   Chronic venous insufficiency 11/09/2018   Overactive bladder 09/30/2018   Peripheral neuropathy 09/30/2018   Apnea 01/02/2016   Lymphedema 11/30/2014   Dyspnea on exertion 11/30/2014   Anxiety 11/29/2014   Arthritis 11/29/2014   COPD (chronic obstructive pulmonary disease) ([place]) 11/29/2014   Clinical depression 11/29/2014   H/O eating disorder 11/29/2014   Esophageal reflux 11/29/2014   Acne inversa 11/29/2014   Essential hypertension 11/29/2014   HLD (hyperlipidemia) 11/29/2014   Adult hypothyroidism 11/29/2014   Insomnia 11/29/2014   Low back pain 11/29/2014   Morbid obesity ([place]) 11/29/2014   Posttraumatic stress disorder 11/29/2014   Vitamin D deficiency 11/29/2014   Nicotine dependence, cigarettes, uncomplicated 48/88/9169    Conditions to be addressed/monitored per PCP order:  Anxiety and Depression  Care Plan :  General Social Work (Adult)  Updates made by [name], LCSW since 01/15/2021 12:00 AM     Problem: Coping Skills (General Plan of Care)      Long-Range Goal: Coping Skills Enhanced   Start Date: 11/07/2020  Recent Progress: On track  Priority: High  Note:   Timeframe:  Long-Range Goal Priority:  High Start Date:       11/07/20                      Expected End Date:  ongoing                Follow Up Date - 02/18/21  Current Barriers:  Financial constraints Mental Health Concerns  Social Isolation Limited education about mental health support resources that are available to her within the local area* Cognitive Deficits Lacks knowledge of community resource  Clinical Social Work Clinical Goal(s):  Over the next 90 days, client will work with SW to address concerns related to experiencing ongoing symptoms of  depression, sadness and grief since the passing of her mother Over the next 90 days, patient will work with LCSW to address needs related to implementing appropriate self-care and depression management.   Interventions: Patient interviewed and appropriate assessments performed Provided mental health counseling with regards to patient's loss and ongoing stressors. Patient was very close with her mother who passed away a few years ago and she continues to experience symptoms of grief from this loss. Patient denies wanting LCSW to make a referral for grief counseling through [place] again but was receptive to coping skill and resource education/information that was provided during session today. Patient reports decorating for the holidays helps her to connect with her mother as they did this together when she was alive.  Provided patient with information about managing depression within her daily life. Patient has ongoing grief symptoms as well. Patient states "I feel worn down." Patient reports that she talks out loud to her mother's picture and this is very helpful to her. Grief support resource education provided again during session today.  Provided reflective listening and implemented appropriate interventions to help suppport patient and her emotional needs  Discussed plans with patient for ongoing care management follow up and provided patient with direct contact information for [facility] team Advised patient to contact [facility] team with any urgent case management needs. Assisted patient/caregiver with obtaining information about health plan benefits LCSW completed past referral for family to have a wheelchair ramp built at their residence due to ongoing mobility concerns. Ramp has been successfully installed.  PCS actively involved with patient and providing ongoing services.  Positive reinforcement provided to patient for quitting smoking cigarettes. Coping skill review education provided for future  triggers.  Brief self-care education provided to both patient and spouse. Patient confirms that she continues to go to food pantry for food insecurity. Resource information has been provided.  Patient reports ongoing pain and issues with mobility. Patient shares that her pain resides in her pains and legs. Emotional support and coping skill education provided. Patient confirms that her PCS aide continues to provide her with services. However, aide has to be reminded to put on her mask since patient is high risk. LCSW reviewed resources that [name] provided her with in the past.  Patient reports that she had a wonderful birthday spent with her family this past May. Patient reports receiving appropriate socialization. Patient reports that her spouse took her out to eat and gifted her with  several things which was a nice surprise.  Patient confirms stable transportation arrangements for upcoming PCP appointment this week.  Patient repots that she is losing a lot of weight but is not sure how much she has lost as she does not have a scale. Patient reports that a goal of hers is to lose weight but she has not been intentionally losing weight at this time.  Patient reports that her aide [name] continues to assist her with PCS and is an excellent support within the home for her and spouse.  Patient planted a tomato plant and successfully grew over 5 tomatoes over the summer. Patient is very proud of this achievement as this was her first time planting a tomato plant and is now interested in picking up gardening/planting as a hobby. Patient reports that her aunt continues to be a positive support for her in her life and someone that she can rely on in the case of emergencies.  Patient was educated on healthy self-care skills and advised her to use these into her daily routine.  Patient reports that she has completed two COVID vaccines and will soon get her booster.  [facility] LCSW completed care coordination  with Thomasene Lot on 01/15/21 and [facility] LCSW provided the following suggested resource-It is likely their best option given their loss of housing and lack of consistent income:  Publishing rights manager                                                                                                                                                                              (Emergency [place]) [phone] Ext. 104 M-F, 8am - 4pm Patient was successfully approved for food stamps but is still experiencing financial concerns. She reports that she and her husband have no clothes that fit them and they are having to start using a string for their pants and patient only has one bra at this time. [facility] LCSW will send referral to [facility] BSW today for financial support.   Patient Self Care Activities:  Attends all scheduled provider appointments Calls provider office for new concerns or questions  Please see past updates related to this goal by clicking on the "Past Updates" button in the selected goal       Depression screen [facility] 2/9 12/02/2020 09/27/2020 05/03/2019 09/30/2018  Decreased Interest 2 0 3 3  Down, Depressed, Hopeless 2 0 3 3  PHQ - 2 Score 4 0 6 6  Altered sleeping _0 Tired, decreased energy _1 Change in appetite 2 - 2 0  Feeling bad or failure about yourself  0 0 2 0  Trouble concentrating 1 0 3 0  Moving slowly or fidgety/restless 0 0 3 3  Suicidal thoughts 0 0 0 0  PHQ-9 Score _0 Difficult doing work/chores Somewhat difficult Not difficult at all Somewhat difficult Not difficult at all     Follow up:  Patient agrees to Care Plan and Follow-up.  Plan: The Managed Medicaid care management team will reach out to the patient again over the next 45 days.  Date of next scheduled Social Work care management/care coordination outreach:  02/18/21  Eula Fried, BSW, MSW, LCSW Managed Medicaid LCSW [email or web address] Phone: [phone]

## 2021-01-16 ENCOUNTER — Other Ambulatory Visit: Payer: Self-pay

## 2021-01-16 NOTE — Patient Outreach (Signed)
Care Coordination  01/16/2021  [name] 22-Dec-1965 [id number]   Medicaid Managed Care   Unsuccessful Outreach Note  01/16/2021 Name: Cheryl Chandler MRN: [id number] DOB: 06-Nov-1965  Referred by: [name], NP Reason for referral : High Risk Managed Medicaid (MM social work Unsuccessful telephone outreach)   An unsuccessful telephone outreach was attempted today. The patient was referred to the case management team for assistance with care management and care coordination.   Follow Up Plan: The care management team will reach out to the patient again over the next 7 days.   [name], BSW, [place] [facility]  [employer] Risk Managed Medicaid Team  [phone]

## 2021-01-16 NOTE — Patient Instructions (Signed)
Visit Information  Ms. Briante Medha Pippen  - as a part of your Medicaid benefit, you are eligible for care management and care coordination services at no cost or copay. I was unable to reach you by phone today but would be happy to help you with your health related needs. Please feel free to call me @ [phone].   A member of the Managed Medicaid care management team will reach out to you again over the next 7 days.   [name], BSW, [place] [facility]  [employer] Risk Managed Medicaid Team  [phone]

## 2021-01-22 ENCOUNTER — Other Ambulatory Visit: Payer: Self-pay | Admitting: Obstetrics and Gynecology

## 2021-01-22 ENCOUNTER — Other Ambulatory Visit: Payer: Self-pay

## 2021-01-22 NOTE — Patient Outreach (Signed)
Medicaid Managed Care   Nurse Care Manager Note  01/22/2021 Name:  [name] MRN:  [id number] DOB:  07-17-1965  Cheryl Chandler is an 55 y.o. year old female who is a primary patient of Cheryl Billings, NP.  The [facility] Managed Care Coordination team was consulted for assistance with:    Chronic healthcare management needs.  [name] was given information about Medicaid Managed Care Coordination team services today. [name] agreed to services and verbal consent obtained.  Engaged with patient by telephone for follow up visit in response to provider referral for case management and/or care coordination services.   Assessments/Interventions:  Review of past medical history, allergies, medications, health status, including review of consultants reports, laboratory and other test data, was performed as part of comprehensive evaluation and provision of chronic care management services.  SDOH (Social Determinants of Health) assessments and interventions performed: [name]    Flowsheet Row Most Recent Value  [name]   Food Insecurity Interventions Other (Comment)  [referral made]  Housing Interventions Intervention Not Indicated  Intimate Partner Violence Interventions Intervention Not Indicated       Care Plan  Allergies  Allergen Reactions   Codeine Shortness Of Breath and Rash   Percocet [Oxycodone-Acetaminophen] Shortness Of Breath   Sulfa Antibiotics Shortness Of Breath and Rash   Aspirin Hives   Bee Venom     Bee stings   Darvon [Propoxyphene]     Darvocet-N 100   Latex    Oxycodone Nausea And Vomiting   Penicillins     Has patient had a PCN reaction causing immediate rash, facial/tongue/throat swelling, SOB or lightheadedness with hypotension: Unknown Has patient had a PCN reaction causing severe rash involving mucus membranes or skin necrosis: Unknown Has patient had a PCN reaction that required  hospitalization: Unknown Has patient had a PCN reaction occurring within the last 10 years: Unknown If all of the above answers are "NO", then may proceed with Cephalosporin use.    Medications Reviewed Today     Reviewed by [name], RN (Registered Nurse) on 01/22/21 at [place] List Status: <None>   Medication Order Taking? Sig Documenting Provider Last Dose Status Informant  ACCU-CHEK GUIDE test strip [id number] No  [provider] Taking Active   Accu-Chek Softclix Lancets lancets [id number] No CHECK BLOOD SUGAR TWICE DAILY Cheryl Billings, NP Taking Active   albuterol (PROVENTIL) (2.5 MG/3ML) 0.083% nebulizer solution [id number] No INHALE CONTENTS OF 1 VIAL IN NEBULIZER EVERY 4 HOURS AS NEEDED Cheryl Billings, NP Taking Active   amLODipine (NORVASC) 5 MG tablet [id number]  Take 2 tablets (10 mg total) by mouth daily. Chandler, Cheryl P, DO  Active   atorvastatin (LIPITOR) 80 MG tablet [id number] No Take 1 tablet (80 mg total) by mouth daily. Cheryl Billings, NP Taking Active   Blood Glucose Monitoring Suppl (ACCU-CHEK GUIDE) w/Device KIT [id number] No Use to check blood sugar 3 times a day. Cheryl Guarneri T, NP Taking Active   carvedilol (COREG) 3.125 MG tablet [id number] No Take 1 tablet (3.125 mg total) by mouth 2 (two) times daily with a meal. Cheryl Gip, DO Taking Active   citalopram (CELEXA) 20 MG tablet [id number] No TAKE 1 TABLET BY MOUTH ONCE DAILY Cheryl Billings, NP Taking Active   EPINEPHrine 0.3 mg/0.3 mL IJ SOAJ injection [id number] No USE AS DIRECTED. Cheryl Billings, NP Taking Active   furosemide (LASIX) 20 MG tablet [id number] No TAKE 1 TABLET BY MOUTH ONCE DAILY IN  AFTERNOON AS NEEDED LEG EDEMA Cheryl Billings, NP Taking Active   furosemide (LASIX) 40 MG tablet [id number] No Take 1 tablet (40 mg total) by mouth daily. Cheryl American, PA-C Taking Active            Med Note [name], [name] Oct 14, 2020  1:41 PM) Told to take in AM, not  afternoon  gabapentin (NEURONTIN) 300 MG capsule [id number] No TAKE 1 CAPSULE BY MOUTH 3 TIMES DAILY ASNEEDED (NEED OFFICE VISIT FOR FURTHER REFILLS) Cheryl Billings, NP Taking Active   levothyroxine (SYNTHROID) 50 MCG tablet [id number] No TAKE 1 TABLET BY MOUTH ONCE DAILY ON AN EMPTY STOMACH. WAIT 30 MINUTES BEFORE TAKING OTHER MEDS. Cheryl Billings, NP Taking Active   lisinopril (ZESTRIL) 40 MG tablet [id number] No TAKE 1 TABLET BY MOUTH ONCE DAILY Cheryl Billings, NP Taking Active   meloxicam (MOBIC) 15 MG tablet [id number]  TAKE 1 TABLET BY MOUTH ONCE DAILY AS NEEDED WITH FOOD OR MILK Cheryl Billings, NP  Active   methocarbamol (ROBAXIN) 750 MG tablet [id number] No TAKE 1-2 TABLETS BY MOUTH 3 TIMES DAILY AS NEEDED Chandler, Cheryl T, NP Taking Active   mometasone-formoterol (DULERA) 100-5 MCG/ACT AERO [id number] No INHALE 2 PUFFS BY MOUTH TWICE A DAY. RINSE MOUTH AFTER USE. Cheryl American, PA-C Taking Active   montelukast (SINGULAIR) 10 MG tablet [id number] No Take 1 tablet (10 mg total) by mouth at bedtime. Cheryl Billings, NP Taking Active   nitroGLYCERIN (NITROSTAT) 0.4 MG SL tablet [id number] No DISSOLVE 1 TABLET UNDER TONGUE AT ONSET OF CHEST PAIN. REPEAT IN 5 MIN IF NOT RESOLVED, MAX 3 DOSES. 911 IF NEEDED. Cheryl Billings, NP Taking Active   omeprazole (PRILOSEC) 20 MG capsule [id number] No Take 1 capsule (20 mg total) by mouth daily. Cheryl Billings, NP Taking Active   QUEtiapine (SEROQUEL) 25 MG tablet [id number] No TAKE 1 TABLET BY MOUTH AT BEDTIME Cheryl Billings, NP Taking Active   SPIRIVA HANDIHALER 18 MCG inhalation capsule [id number]  INHALE CONTENTS OF 1 CAPSULE ONCE DAILY Cheryl Billings, NP  Active   triamcinolone cream (KENALOG) 0.1 % [id number] No APPLY 1 APPLICATION TOPICALLY TWICE DAILY Cheryl Lick, NP Taking Active   TRULICITY 0.93 OI/7.1IW SOPN [id number] No INJECT 0.75MG SUBQ ONCE A WEEK Cheryl Billings, NP Taking Active   Vitamin D, Ergocalciferol,  (DRISDOL) 1.25 MG (50000 UNIT) CAPS capsule [id number] No TAKE 1 CAPSULE BY MOUTH ONCE EVERY 7 DAYS Cheryl Billings, NP Taking Active             Patient Active Problem List   Diagnosis Date Noted   Fibrocystic disease of both breasts 05/07/2019   IFG (impaired fasting glucose) 05/03/2019   Chronic venous insufficiency 11/09/2018   Overactive bladder 09/30/2018   Peripheral neuropathy 09/30/2018   Apnea 01/02/2016   Lymphedema 11/30/2014   Dyspnea on exertion 11/30/2014   Anxiety 11/29/2014   Arthritis 11/29/2014   COPD (chronic obstructive pulmonary disease) ([place]) 11/29/2014   Clinical depression 11/29/2014   H/O eating disorder 11/29/2014   Esophageal reflux 11/29/2014   Acne inversa 11/29/2014   Essential hypertension 11/29/2014   HLD (hyperlipidemia) 11/29/2014   Adult hypothyroidism 11/29/2014   Insomnia 11/29/2014   Low back pain 11/29/2014   Morbid obesity ([place]) 11/29/2014   Posttraumatic stress disorder 11/29/2014   Vitamin D deficiency 11/29/2014   Nicotine dependence, cigarettes, uncomplicated 34/19/3790    Conditions to be addressed/monitored per PCP order:   chronic healthcare management needs, tobacco use, obesity, anxiety, depression, PTSD,  HLD, HTN, COPD, arthritis, lymphedema  Care Plan : RNCM: COPD (Adult)  Updates made by [name], RN since 01/22/2021 12:00 AM     Problem: RNCM: Psychological Adjustment to Diagnosis (COPD)   Priority: Medium  Onset Date: 05/22/2020     Long-Range Goal: RNCM: Adjustment to Disease Achieved   Start Date: 05/22/2020  Expected End Date: 03/27/2022  Recent Progress: On track  Priority: High  Note:   Current Barriers:  Knowledge deficits related to basic understanding of COPD disease process Knowledge deficits related to basic COPD self care/management Knowledge deficit related to basic understanding of how to use inhalers and how inhaled medications work Knowledge deficit related to importance of energy  conservation Limited Social Support Unable to independently manage COPD Lacks social connections Does not contact provider office for questions/concerns  Case Manager Clinical Goal(s): patient will report using inhalers as prescribed including rinsing mouth after use patient will be able to verbalize understanding of COPD action plan and when to seek appropriate levels of medical care  patient will engage in lite exercise as tolerated to build/regain stamina and strength and reduce shortness of breath through activity tolerance  patient will verbalize basic understanding of COPD disease process and self care activities  Interventions:  Collaboration with Cheryl Billings, NP regarding development and update of comprehensive plan of care as evidenced by provider attestation and co-signature Inter-disciplinary care team collaboration (see longitudinal plan of care) UNABLE to independently: manage COPD Provided patient with basic written and verbal COPD education on self care/management/and exacerbation prevention. 12-02-2020: The patient states she is not feeling well. Was in the ER recently for an infection in her right foot. The patient states that she needs to go to the wound clinic but her insurance will not pay for it. Has a follow up with the pcp on 12-04-2020. Advised the patient to discuss with the pcp her options and also talk about her change in her breathing. The patient has been taking her pulse ox readings and states that her levels have been low and in the 80's. The patient denies any acute breathing issues today but feels her health is not in the best place right now.  Update 12/26/20:  Patient with no breathing complaint today-checks oxygen saturations as needed. Update 01/22/21:  no complaints today. Provided patient with COPD action plan and reinforced importance of daily self assessment. 12-02-2020: Education and support given.  Provided written and verbal instructions on pursed lip  breathing and utilized returned demonstration as teach back Provided instruction about proper use of medications used for management of COPD including inhalers Advised patient to self assesses COPD action plan zone and make appointment with provider if in the yellow zone for 48 hours without improvement. Provided patient with education about the role of exercise in the management of COPD Advised patient to engage in light exercise as tolerated 3-5 days a week Provided education about and advised patient to utilize infection prevention strategies to reduce risk of respiratory infection. 12-02-2020: The patient denies any pulmonary infections, is currently dealing with a foot infections. Is on abx and will follow up with the pcp on 12-04-2020.  Patient Goals/Self-Care Activities:  - decision-making supported - depression screen reviewed - emotional support provided - family involvement promoted - problem-solving facilitated - relaxation techniques promoted - verbalization of feelings encouraged Follow Up Plan: Telephone follow up appointment with care management team member scheduled for: 12-02-2020: The patient transitioning to the [facility] team and will be followed by the CCM  team for [facility]. The patient knows to expect a call from the scheduler to schedule with HRMM    Problem: RNCM: Symptom Exacerbation (COPD)   Priority: High  Onset Date: 05/22/2020     Long-Range Goal: RNCM: Smoking Cessation Symptom Exacerbation Prevented or Minimized   Start Date: 05/22/2020  Expected End Date: 03/27/2022  Recent Progress: Not on track  Priority: High  Note:   Current Barriers:  Unable to independently stop smoking  Does not adhere to provider recommendations re: smoking cessation  Lacks social connections Does not contact provider office for questions/concerns Tobacco abuse of >40 years; currently smoking 1 pack every 2 weeks Previous quit attempts, unsuccessful several successful using patches, gum- has a  pattern of stopping and then starting back  Reports smoking within 30 minutes of waking up Reports triggers to smoke include: stress, chronic conditions  Reports motivation to quit smoking includes: knows it will help her breathing and other chronic conditions  On a scale of 1-10, reports MOTIVATION to quit is 8 On a scale of 1-10, reports CONFIDENCE in quitting is 7 Clinical Goal(s):  patient will work with RN Case Engineer, civil (consulting) and provider towards tobacco cessation  Interventions: Collaboration with Cheryl Billings, NP regarding development and update of comprehensive plan of care as evidenced by provider attestation and co-signature Inter-disciplinary care team collaboration (see longitudinal plan of care) Evaluation of current treatment plan reviewed. 12-02-2020: The patient is under a lot of stress and says smoking is what she does to  help with her stress relief. Update 12/26/20:  Patient smoking 4-5 cigarettes a day, trying to quit, discussed contacting PCP.  Update 01/22/21:  Patient states she is smoking a pack every 2 weeks, has tried gum. Provided contact information for [place] [facility] (1-800-QUIT-NOW). Patient will outreach this group for support. Update 01/22/21:  Patient states she has utilized [employer] for resources. Discussed plans with patient for ongoing care management follow up and provided patient with direct contact information for care management team Provided patient with printed smoking cessation educational materials Provided contact information for [place] [facility] (1-800-QUIT-NOW). Patient will outreach this group for support. Evaluation of current treatment plan reviewed-12-02-2020:  currently the patient is cutting back but wants to quit. Patient knows she can not smoke and use oxygen.  Patient Goals/Self-Care Activities patient will:  - mindfulness, talking to family and friends as a habit during cravings  - verbally commit to reducing tobacco  consumption - barriers to lifestyle changes reviewed and addressed - barriers to treatment reviewed and addressed - breathing techniques encouraged - modification of home and work environment promoted - rescue (action) plan reviewed - signs/symptoms of infection reviewed - signs/symptoms of worsening disease assessed - treatment plan reviewed Follow Up Plan: Telephone follow up appointment with care management team member scheduled for:The patient is transitioning to the [facility] team and will be followed by new RNCM. The patient accepts transfer today. 12-02-2020    Care Plan : RNCM: Hypertension (Adult)  Updates made by [name], RN since 01/22/2021 12:00 AM     Problem: RNCM: Hypertension (Hypertension)   Priority: Medium  Onset Date: 05/22/2020     Long-Range Goal: RNCM: Hypertension Monitored   Start Date: 05/22/2020  Expected End Date: 03/27/2022  Recent Progress: On track  Priority: Medium  Note:   Objective:  Last practice recorded BP readings:  BP Readings from Last 3 Encounters:  11/20/20 (!) 154/68  11/20/20 113/60  10/30/20 126/67  Most recent eGFR/CrCl: No results found for: EGFR  No components found for: CRCL Current Barriers:  Knowledge Deficits related to basic understanding of hypertension pathophysiology and self care management Knowledge Deficits related to understanding of medications prescribed for management of hypertension Limited Social Support Unable to independently manage HTN Lacks social connections Does not contact provider office for questions/concerns Case Manager Clinical Goal(s):   patient will verbalize understanding of plan for hypertension management patient will demonstrate improved adherence to prescribed treatment plan for hypertension as evidenced by taking all medications as prescribed, monitoring and recording blood pressure as directed, adhering to low sodium/DASH diet patient will demonstrate improved health management  independence as evidenced by checking blood pressure as directed and notifying PCP if SBP>160 or DBP > 90, taking all medications as prescribe, and adhering to a low sodium diet as discussed. Interventions:  Collaboration with Cheryl Billings, NP regarding development and update of comprehensive plan of care as evidenced by provider attestation and co-signature Inter-disciplinary care team collaboration (see longitudinal plan of care) Evaluation of current treatment plan related to hypertension self management and patient's adherence to plan as established by provider. 12-02-2020: The patient has been very stressed out and states that she feels her health is out of control. She says she was supposed to go have an ECHO but she has not been able to do so because she was in the ER recently and has an infection on the bottom of her right foot. She states she is still taking abx for the infection. Has a follow up with pcp on 12-04-2020. The patient advised to seek emergent help for changes in her condition.  Update 12/26/20:  patient checking blood pressure daily-WNL today. Update 01/22/21:  Patient states that she checks her blood pressure 2 times a day, last reading 165/60-encouraged patient to follow up with provider. Provided education to patient re: stroke prevention, s/s of heart attack and stroke, DASH diet, complications of uncontrolled blood pressure Reviewed medications with patient and discussed importance of compliance. 12-02-2020: States compliance in medications. Is worried about getting what she needs to care for herself.  Discussed plans with patient for ongoing care management follow up and provided patient with direct contact information for care management team Advised patient, providing education and rationale, to monitor blood pressure daily and record, calling PCP for findings outside established parameters.  Patient Goals/Self-Care Activities Over the next 120 days, patient will:  - Self  administers medications as prescribed Attends all scheduled provider appointments Calls provider office for new concerns, questions, or BP outside discussed parameters Checks BP and records as discussed Follows a low sodium diet/DASH diet - blood pressure trends reviewed - depression screen reviewed - home or ambulatory blood pressure monitoring encouraged Follow Up Plan: Telephone follow up appointment with care management team member scheduled for:  Transitioning to [facility] team. Will follow up with them at scheduled time.      Follow Up:  Patient agrees to Care Plan and Follow-up.  Plan: The Managed Medicaid care management team will reach out to the patient again over the next 30 days. and The patient has been provided with contact information for the Managed Medicaid care management team and has been advised to call with any health related questions or concerns.  Date/time of next scheduled RN care management/care coordination outreach:  02/21/21 at 1245.

## 2021-01-22 NOTE — Patient Instructions (Signed)
Visit Information  Ms. Loudin was given information about Medicaid Managed Care team care coordination services as a part of their [facility] Medicaid benefit. Leimomi Silva Aamodt verbally consented to engagement with the [facility] Managed Care team.   If you are experiencing a medical emergency, please call 911 or report to your local emergency department or urgent care.   If you have a non-emergency medical problem during routine business hours, please contact your provider's office and ask to speak with a nurse.   For questions related to your [facility] health plan, please call: [phone] or go here:[email or web address]  If you would like to schedule transportation through your [facility] plan, please call the following number at least 2 days in advance of your appointment: [phone].  Call the [place] at [phone], at any time, 24 hours a day, 7 days a week. If you are in danger or need immediate medical attention call 911.  If you would like help to quit smoking, call 1-800-QUIT-NOW 806-541-0185) OR Espaol: 1-855-Djelo-Ya (5-465-681-2751) o para ms informacin haga clic aqu or Text READY to 200-400 to register via text  Ms. Draughon - following are the goals we discussed in your visit today:   Goals Addressed             This Visit's Progress    RNCM: Keep Skin Clean and Dry       Follow Up Date: 30 to 60 days  - clean and dry skin well - keep skin dry - use a fragrance-free lotion on skin - wear a protective pad or garment    Why is this important?   Leaking urine (pee) can cause soreness from skin rashes and redness.  This is caused by skin being exposed to urine (pee).    12-02-2020: The patient states she currently has a wound on the bottom of her right foot. She was seen in the ER on 11-20-2020. The patient is currently taking antibiotics. She has a follow up with the pcp on 12-04-2020. Advised the patient to  keep appointment with pcp and got back to the ER for fever, purulent drainage, odor, or changes in wound. The patient verbalized understanding.   (/1/22:  patient has appointment with Dr. Posey Pronto 01/07/21 for follow up-still taking antibiotics. Update 01/22/21:  Patient states right foot wound is healing-has follow up appointment scheduled.     RNCM: Track and Manage My Symptoms-Depression       Timeframe:  Long-Range Goal Priority:  High Start Date:    12-02-2020                         Expected End Date:   ongoing                Follow Up Date: 02/21/21   - avoid negative self-talk - develop a personal safety plan - develop a plan to deal with triggers like holidays, anniversaries - exercise at least 2 to 3 times per week - have a plan for how to handle bad days - journal feelings and what helps to feel better or worse - spend time or talk with others at least 2 to 3 times per week - spend time or talk with others every day - watch for early signs of feeling worse    Why is this important?   Keeping track of your progress will help your treatment team find the right mix of medicine and therapy for you.  Write  in your journal every day.  Day-to-day changes in depression symptoms are normal. It may be more helpful to check your progress at the end of each week instead of every day.     Notes: 12-02-2020: The patient states she is having a difficult time because of all the health conditions she is faced with right now. She has a wound on her right foot and is currently taking abx.  She hasn't felt well. She comes back to see the pcp on 12-04-2020. Education on working with the CCM team to help manage health and well being.  Update 12/26/20:  patient has appointment with LCSW 01/01/21 Update 01/22/21:  LCSW continues to follow.     RNCM: Track and Manage My Triggers-COPD       Timeframe:  Long-Range Goal Priority:  Medium Start Date:     05-22-2020                        Expected End Date:          05-22-2021              Follow Up Date 30 to 60 days   - avoid second hand smoke - eliminate smoking in my home - identify and avoid work-related triggers - identify and remove indoor air pollutants - limit outdoor activity during cold weather - listen for public air quality announcements every day    Why is this important?   Triggers are activities or things, like tobacco smoke or cold weather, that make your COPD (chronic obstructive pulmonary disease) flare-up.  Knowing these triggers helps you plan how to stay away from them.  When you cannot remove them, you can learn how to manage them.     12-02-2020: The patient states the ER doctor told her that she needs oxygen. The patient states that she has been taking her oxygen levels and they have been in the 80's. Recommended the patient to write down values and bring to the next pcp visit.  Update 12/26/20:  patient continues to check oxygen saturations-WNL so far. Update 01/22/21:  No complaints today.        The patient verbalized understanding of instructions provided today and declined a print copy of patient instruction materials.   The Managed Medicaid care management team will reach out to the patient again over the next 30 days.  The  Patient has been provided with contact information for the Managed Medicaid care management team and has been advised to call with any health related questions or concerns.   Aida Raider RN, BSN   [facility] - Managed Medicaid High Risk [phone].   Following is a copy of your plan of care:   Patient Care Plan: RNCM: COPD (Adult)     Problem Identified: RNCM: Psychological Adjustment to Diagnosis (COPD)   Priority: Medium  Onset Date: 05/22/2020     Long-Range Goal: RNCM: Adjustment to Disease Achieved   Start Date: 05/22/2020  Expected End Date: 03/27/2022  Recent Progress: On track  Priority: High  Note:   Current Barriers:   Knowledge deficits related to basic understanding of COPD disease process Knowledge deficits related to basic COPD self care/management Knowledge deficit related to basic understanding of how to use inhalers and how inhaled medications work Knowledge deficit related to importance of energy conservation Limited Social Support Unable to independently manage COPD Lacks social connections Does not contact provider office for questions/concerns  Case  Manager Clinical Goal(s): patient will report using inhalers as prescribed including rinsing mouth after use patient will be able to verbalize understanding of COPD action plan and when to seek appropriate levels of medical care  patient will engage in lite exercise as tolerated to build/regain stamina and strength and reduce shortness of breath through activity tolerance  patient will verbalize basic understanding of COPD disease process and self care activities  Interventions:  Collaboration with [name], NP regarding development and update of comprehensive plan of care as evidenced by provider attestation and co-signature Inter-disciplinary care team collaboration (see longitudinal plan of care) UNABLE to independently: manage COPD Provided patient with basic written and verbal COPD education on self care/management/and exacerbation prevention. 12-02-2020: The patient states she is not feeling well. Was in the ER recently for an infection in her right foot. The patient states that she needs to go to the wound clinic but her insurance will not pay for it. Has a follow up with the pcp on 12-04-2020. Advised the patient to discuss with the pcp her options and also talk about her change in her breathing. The patient has been taking her pulse ox readings and states that her levels have been low and in the 80's. The patient denies any acute breathing issues today but feels her health is not in the best place right now.  Update 12/26/20:  Patient with no  breathing complaint today-checks oxygen saturations as needed. Update 01/22/21:  no complaints today. Provided patient with COPD action plan and reinforced importance of daily self assessment. 12-02-2020: Education and support given.  Provided written and verbal instructions on pursed lip breathing and utilized returned demonstration as teach back Provided instruction about proper use of medications used for management of COPD including inhalers Advised patient to self assesses COPD action plan zone and make appointment with provider if in the yellow zone for 48 hours without improvement. Provided patient with education about the role of exercise in the management of COPD Advised patient to engage in light exercise as tolerated 3-5 days a week Provided education about and advised patient to utilize infection prevention strategies to reduce risk of respiratory infection. 12-02-2020: The patient denies any pulmonary infections, is currently dealing with a foot infections. Is on abx and will follow up with the pcp on 12-04-2020.  Patient Goals/Self-Care Activities:  - decision-making supported - depression screen reviewed - emotional support provided - family involvement promoted - problem-solving facilitated - relaxation techniques promoted - verbalization of feelings encouraged Follow Up Plan: Telephone follow up appointment with care management team member scheduled for: 12-02-2020: The patient transitioning to the [facility] team and will be followed by the CCM team for [facility]. The patient knows to expect a call from the scheduler to schedule with HRMM    Problem Identified: RNCM: Symptom Exacerbation (COPD)   Priority: High  Onset Date: 05/22/2020     Long-Range Goal: RNCM: Smoking Cessation Symptom Exacerbation Prevented or Minimized   Start Date: 05/22/2020  Expected End Date: 03/27/2022  Recent Progress: Not on track  Priority: High  Note:   Current Barriers:  Unable to independently stop smoking   Does not adhere to provider recommendations re: smoking cessation  Lacks social connections Does not contact provider office for questions/concerns Tobacco abuse of >40 years; currently smoking 1 pack every 2 weeks Previous quit attempts, unsuccessful several successful using patches, gum- has a pattern of stopping and then starting back  Reports smoking within 30 minutes of waking up Reports triggers  to smoke include: stress, chronic conditions  Reports motivation to quit smoking includes: knows it will help her breathing and other chronic conditions  On a scale of 1-10, reports MOTIVATION to quit is 8 On a scale of 1-10, reports CONFIDENCE in quitting is 7 Clinical Goal(s):  patient will work with RN Case Engineer, civil (consulting) and provider towards tobacco cessation  Interventions: Collaboration with [name], NP regarding development and update of comprehensive plan of care as evidenced by provider attestation and co-signature Inter-disciplinary care team collaboration (see longitudinal plan of care) Evaluation of current treatment plan reviewed. 12-02-2020: The patient is under a lot of stress and says smoking is what she does to  help with her stress relief. Update 12/26/20:  Patient smoking 4-5 cigarettes a day, trying to quit, discussed contacting PCP.  Update 01/22/21:  Patient states she is smoking a pack every 2 weeks, has tried gum. Provided contact information for [place] [facility] (1-800-QUIT-NOW). Patient will outreach this group for support. Update 01/22/21:  Patient states she has utilized [employer] for resources. Discussed plans with patient for ongoing care management follow up and provided patient with direct contact information for care management team Provided patient with printed smoking cessation educational materials Provided contact information for [place] [facility] (1-800-QUIT-NOW). Patient will outreach this group for support. Evaluation of current  treatment plan reviewed-12-02-2020:  currently the patient is cutting back but wants to quit. Patient knows she can not smoke and use oxygen.  Patient Goals/Self-Care Activities patient will:  - mindfulness, talking to family and friends as a habit during cravings  - verbally commit to reducing tobacco consumption - barriers to lifestyle changes reviewed and addressed - barriers to treatment reviewed and addressed - breathing techniques encouraged - modification of home and work environment promoted - rescue (action) plan reviewed - signs/symptoms of infection reviewed - signs/symptoms of worsening disease assessed - treatment plan reviewed Follow Up Plan: Telephone follow up appointment with care management team member scheduled for:The patient is transitioning to the [facility] team and will be followed by new RNCM. The patient accepts transfer today. 12-02-2020     Patient Care Plan: RNCM: Hypertension (Adult)     Problem Identified: RNCM: Hypertension (Hypertension)   Priority: Medium  Onset Date: 05/22/2020     Long-Range Goal: RNCM: Hypertension Monitored   Start Date: 05/22/2020  Expected End Date: 03/27/2022  Recent Progress: On track  Priority: Medium  Note:   Objective:  Last practice recorded BP readings:  BP Readings from Last 3 Encounters:  11/20/20 (!) 154/68  11/20/20 113/60  10/30/20 126/67    Most recent eGFR/CrCl: No results found for: EGFR  No components found for: CRCL Current Barriers:  Knowledge Deficits related to basic understanding of hypertension pathophysiology and self care management Knowledge Deficits related to understanding of medications prescribed for management of hypertension Limited Social Support Unable to independently manage HTN Lacks social connections Does not contact provider office for questions/concerns Case Manager Clinical Goal(s):   patient will verbalize understanding of plan for hypertension management patient will demonstrate  improved adherence to prescribed treatment plan for hypertension as evidenced by taking all medications as prescribed, monitoring and recording blood pressure as directed, adhering to low sodium/DASH diet patient will demonstrate improved health management independence as evidenced by checking blood pressure as directed and notifying PCP if SBP>160 or DBP > 90, taking all medications as prescribe, and adhering to a low sodium diet as discussed. Interventions:  Collaboration with Mathis Dad,  Santiago Glad, NP regarding development and update of comprehensive plan of care as evidenced by provider attestation and co-signature Inter-disciplinary care team collaboration (see longitudinal plan of care) Evaluation of current treatment plan related to hypertension self management and patient's adherence to plan as established by provider. 12-02-2020: The patient has been very stressed out and states that she feels her health is out of control. She says she was supposed to go have an ECHO but she has not been able to do so because she was in the ER recently and has an infection on the bottom of her right foot. She states she is still taking abx for the infection. Has a follow up with pcp on 12-04-2020. The patient advised to seek emergent help for changes in her condition.  Update 12/26/20:  patient checking blood pressure daily-WNL today. Update 01/22/21:  Patient states that she checks her blood pressure 2 times a day, last reading 165/60-encouraged patient to follow up with provider. Provided education to patient re: stroke prevention, s/s of heart attack and stroke, DASH diet, complications of uncontrolled blood pressure Reviewed medications with patient and discussed importance of compliance. 12-02-2020: States compliance in medications. Is worried about getting what she needs to care for herself.  Discussed plans with patient for ongoing care management follow up and provided patient with direct contact information for care  management team Advised patient, providing education and rationale, to monitor blood pressure daily and record, calling PCP for findings outside established parameters.  Patient Goals/Self-Care Activities Over the next 120 days, patient will:  - Self administers medications as prescribed Attends all scheduled provider appointments Calls provider office for new concerns, questions, or BP outside discussed parameters Checks BP and records as discussed Follows a low sodium diet/DASH diet - blood pressure trends reviewed - depression screen reviewed - home or ambulatory blood pressure monitoring encouraged Follow Up Plan: Telephone follow up appointment with care management team member scheduled for:  Transitioning to [facility] team. Will follow up with them at scheduled time.

## 2021-01-25 DIAGNOSIS — Z419 Encounter for procedure for purposes other than remedying health state, unspecified: Secondary | ICD-10-CM | POA: Diagnosis not present

## 2021-01-27 ENCOUNTER — Other Ambulatory Visit: Payer: Self-pay

## 2021-01-27 NOTE — Patient Outreach (Signed)
Care Coordination  01/27/2021  [name] [birth year] [id number]   Medicaid Managed Care   Unsuccessful Outreach Note  01/27/2021 Name: Cheryl Chandler MRN: [id number] DOB: 07-30-1965  Referred by: [name], NP Reason for referral : High Risk Managed Medicaid (MM social work unsuccessful Telephone Outreach)   A second unsuccessful telephone outreach was attempted today. The patient was referred to the case management team for assistance with care management and care coordination.   Follow Up Plan: The care management team will reach out to the patient again over the next 1 days.  Resceduled for 01/28/21 at 10am [name], BSW, [place] [facility]  [employer] Risk Managed Medicaid Team  [phone]

## 2021-01-27 NOTE — Patient Instructions (Signed)
Visit Information  Ms. Gladyse Deseri Loss  - as a part of your Medicaid benefit, you are eligible for care management and care coordination services at no cost or copay. I was unable to reach you by phone today but would be happy to help you with your health related needs. Please feel free to call me @ [phone].   A member of the Managed Medicaid care management team will reach out to you again over the next 1 days.   [name], BSW, [place] [facility]  [employer] Risk Managed Medicaid Team  [phone]

## 2021-01-28 ENCOUNTER — Other Ambulatory Visit: Payer: Self-pay

## 2021-01-28 NOTE — Patient Outreach (Signed)
Medicaid Managed Care Social Work Note  01/28/2021 Name:  Cheryl Chandler MRN:  [id number] DOB:  [birth year]  Cheryl Chandler is an 55 y.o. year old female who is a primary patient of Cheryl Billings, NP.  The Medicaid Managed Care Coordination team was consulted for assistance with:  Community Resources   [name] was given information about Medicaid Managed Care Coordination team services today. [name] agreed to services and verbal consent obtained.  Engaged with patient  for by telephone forinitial visit in response to referral for case management and/or care coordination services.   Assessments/Interventions:  Review of past medical history, allergies, medications, health status, including review of consultants reports, laboratory and other test data, was performed as part of comprehensive evaluation and provision of chronic care management services.  SDOH: (Social Determinant of Health) assessments and interventions performed: BSW spoke with patient about resources needed. Patient states she does receive SSI and was approved for foodstamps. Patient is familiar with all of the food pantries and other resources in [place]. BSW will assist patient with finding an eye doctor. Patient states she has not been in 10 years. BSW will research and put a list of eye doctors together that accept Medicaid in [facility].   Advanced Directives Status:  Not addressed in this encounter.  Care Plan                 Allergies  Allergen Reactions   Codeine Shortness Of Breath and Rash   Percocet [Oxycodone-Acetaminophen] Shortness Of Breath   Sulfa Antibiotics Shortness Of Breath and Rash   Aspirin Hives   Bee Venom     Bee stings   Darvon [Propoxyphene]     Darvocet-N 100   Latex    Oxycodone Nausea And Vomiting   Penicillins     Has patient had a PCN reaction causing immediate rash, facial/tongue/throat swelling, SOB or lightheadedness with  hypotension: Unknown Has patient had a PCN reaction causing severe rash involving mucus membranes or skin necrosis: Unknown Has patient had a PCN reaction that required hospitalization: Unknown Has patient had a PCN reaction occurring within the last 10 years: Unknown If all of the above answers are "NO", then may proceed with Cephalosporin use.    Medications Reviewed Today     Reviewed by [name], RN (Registered Nurse) on 01/22/21 at [place] List Status: <None>   Medication Order Taking? Sig Documenting Provider Last Dose Status Informant  ACCU-CHEK GUIDE test strip [id number] No  [provider] Taking Active   Accu-Chek Softclix Lancets lancets [id number] No CHECK BLOOD SUGAR TWICE DAILY Cheryl Billings, NP Taking Active   albuterol (PROVENTIL) (2.5 MG/3ML) 0.083% nebulizer solution [id number] No INHALE CONTENTS OF 1 VIAL IN NEBULIZER EVERY 4 HOURS AS NEEDED Cheryl Billings, NP Taking Active   amLODipine (NORVASC) 5 MG tablet [id number]  Take 2 tablets (10 mg total) by mouth daily. Johnson, [name], DO  Active   atorvastatin (LIPITOR) 80 MG tablet [id number] No Take 1 tablet (80 mg total) by mouth daily. Cheryl Billings, NP Taking Active   Blood Glucose Monitoring Suppl (ACCU-CHEK GUIDE) w/Device KIT [id number] No Use to check blood sugar 3 times a day. [name], NP Taking Active   carvedilol (COREG) 3.125 MG tablet [id number] No Take 1 tablet (3.125 mg total) by mouth 2 (two) times daily with a meal. [name], DO Taking Active   citalopram (CELEXA) 20 MG tablet [id number] No TAKE 1 TABLET  BY MOUTH ONCE DAILY Cheryl Billings, NP Taking Active   EPINEPHrine 0.3 mg/0.3 mL IJ SOAJ injection [id number] No USE AS DIRECTED. Cheryl Billings, NP Taking Active   furosemide (LASIX) 20 MG tablet [id number] No TAKE 1 TABLET BY MOUTH ONCE DAILY IN AFTERNOON AS NEEDED LEG EDEMA Cheryl Billings, NP Taking Active   furosemide (LASIX) 40 MG tablet [id number] No Take 1  tablet (40 mg total) by mouth daily. [name], PA-C Taking Active            Med Note [name], [name] Oct 14, 2020  1:41 PM) Told to take in AM, not afternoon  gabapentin (NEURONTIN) 300 MG capsule [id number] No TAKE 1 CAPSULE BY MOUTH 3 TIMES DAILY ASNEEDED (NEED OFFICE VISIT FOR FURTHER REFILLS) Cheryl Billings, NP Taking Active   levothyroxine (SYNTHROID) 50 MCG tablet [id number] No TAKE 1 TABLET BY MOUTH ONCE DAILY ON AN EMPTY STOMACH. WAIT 30 MINUTES BEFORE TAKING OTHER MEDS. Cheryl Billings, NP Taking Active   lisinopril (ZESTRIL) 40 MG tablet [id number] No TAKE 1 TABLET BY MOUTH ONCE DAILY Cheryl Billings, NP Taking Active   meloxicam (MOBIC) 15 MG tablet [id number]  TAKE 1 TABLET BY MOUTH ONCE DAILY AS NEEDED WITH FOOD OR MILK Cheryl Billings, NP  Active   methocarbamol (ROBAXIN) 750 MG tablet [id number] No TAKE 1-2 TABLETS BY MOUTH 3 TIMES DAILY AS NEEDED [name], [name], NP Taking Active   mometasone-formoterol (DULERA) 100-5 MCG/ACT AERO [id number] No INHALE 2 PUFFS BY MOUTH TWICE A DAY. RINSE MOUTH AFTER USE. [name], PA-C Taking Active   montelukast (SINGULAIR) 10 MG tablet [id number] No Take 1 tablet (10 mg total) by mouth at bedtime. Cheryl Billings, NP Taking Active   nitroGLYCERIN (NITROSTAT) 0.4 MG SL tablet [id number] No DISSOLVE 1 TABLET UNDER TONGUE AT ONSET OF CHEST PAIN. REPEAT IN 5 MIN IF NOT RESOLVED, MAX 3 DOSES. 911 IF NEEDED. Cheryl Billings, NP Taking Active   omeprazole (PRILOSEC) 20 MG capsule [id number] No Take 1 capsule (20 mg total) by mouth daily. Cheryl Billings, NP Taking Active   QUEtiapine (SEROQUEL) 25 MG tablet [id number] No TAKE 1 TABLET BY MOUTH AT BEDTIME Cheryl Billings, NP Taking Active   SPIRIVA HANDIHALER 18 MCG inhalation capsule [id number]  INHALE CONTENTS OF 1 CAPSULE ONCE DAILY Cheryl Billings, NP  Active   triamcinolone cream (KENALOG) 0.1 % [id number] No APPLY 1 APPLICATION TOPICALLY TWICE DAILY [name], NP Taking Active   TRULICITY 9.74 BU/3.8GT SOPN [id number] No INJECT 0.75MG SUBQ ONCE A [name], NP Taking Active   Vitamin D, Ergocalciferol, (DRISDOL) 1.25 MG (50000 UNIT) CAPS capsule [id number] No TAKE 1 CAPSULE BY MOUTH ONCE EVERY 7 DAYS Cheryl Billings, NP Taking Active             Patient Active Problem List   Diagnosis Date Noted   Fibrocystic disease of both breasts 05/07/2019   IFG (impaired fasting glucose) 05/03/2019   Chronic venous insufficiency 11/09/2018   Overactive bladder 09/30/2018   Peripheral neuropathy 09/30/2018   Apnea 01/02/2016   Lymphedema 11/30/2014   Dyspnea on exertion 11/30/2014   Anxiety 11/29/2014   Arthritis 11/29/2014   COPD (chronic obstructive pulmonary disease) ([place]) 11/29/2014   Clinical depression 11/29/2014   H/O eating disorder 11/29/2014   Esophageal reflux 11/29/2014   Acne inversa 11/29/2014   Essential hypertension 11/29/2014   HLD (hyperlipidemia) 11/29/2014   Adult hypothyroidism 11/29/2014   Insomnia 11/29/2014   Low back pain 11/29/2014  Morbid obesity ([place]) 11/29/2014   Posttraumatic stress disorder 11/29/2014   Vitamin D deficiency 11/29/2014   Nicotine dependence, cigarettes, uncomplicated 16/01/9603    Conditions to be addressed/monitored per PCP order:   eye doctor  Care Plan : General Social Work (Adult)  Updates made by Ethelda Chick since 01/28/2021 12:00 AM     Problem: Coping Skills (General Plan of Care)      Long-Range Goal: Coping Skills Enhanced   Start Date: 11/07/2020  Recent Progress: On track  Priority: High  Note:   Timeframe:  Long-Range Goal Priority:  High Start Date:       11/07/20                      Expected End Date:  ongoing                Follow Up Date - 02/18/21  Current Barriers:  Financial constraints Mental Health Concerns  Social Isolation Limited education about mental health support resources that are available to her within the local  area* Cognitive Deficits Lacks knowledge of community resource  Clinical Social Work Clinical Goal(s):  Over the next 90 days, client will work with SW to address concerns related to experiencing ongoing symptoms of depression, sadness and grief since the passing of her mother Over the next 90 days, patient will work with LCSW to address needs related to implementing appropriate self-care and depression management.   Interventions: Patient interviewed and appropriate assessments performed Provided mental health counseling with regards to patient's loss and ongoing stressors. Patient was very close with her mother who passed away a few years ago and she continues to experience symptoms of grief from this loss. Patient denies wanting LCSW to make a referral for grief counseling through [facility] again but was receptive to coping skill and resource education/information that was provided during session today. Patient reports decorating for the holidays helps her to connect with her mother as they did this together when she was alive.  Provided patient with information about managing depression within her daily life. Patient has ongoing grief symptoms as well. Patient states "I feel worn down." Patient reports that she talks out loud to her mother's picture and this is very helpful to her. Grief support resource education provided again during session today.  Provided reflective listening and implemented appropriate interventions to help suppport patient and her emotional needs  Discussed plans with patient for ongoing care management follow up and provided patient with direct contact information for [facility] team Advised patient to contact [facility] team with any urgent case management needs. Assisted patient/caregiver with obtaining information about health plan benefits LCSW completed past referral for family to have a wheelchair ramp built at their residence due to ongoing mobility concerns. Ramp has been  successfully installed.  PCS actively involved with patient and providing ongoing services.  Positive reinforcement provided to patient for quitting smoking cigarettes. Coping skill review education provided for future triggers.  Brief self-care education provided to both patient and spouse. Patient confirms that she continues to go to food pantry for food insecurity. Resource information has been provided.  Patient reports ongoing pain and issues with mobility. Patient shares that her pain resides in her pains and legs. Emotional support and coping skill education provided. Patient confirms that her PCS aide continues to provide her with services. However, aide has to be reminded to put on her mask since patient is high risk. LCSW reviewed resources that [name] provided  her with in the past.  Patient reports that she had a wonderful birthday spent with her family this past May. Patient reports receiving appropriate socialization. Patient reports that her spouse took her out to eat and gifted her with several things which was a nice surprise.  Patient confirms stable transportation arrangements for upcoming PCP appointment this week.  Patient repots that she is losing a lot of weight but is not sure how much she has lost as she does not have a scale. Patient reports that a goal of hers is to lose weight but she has not been intentionally losing weight at this time.  Patient reports that her aide [name] continues to assist her with PCS and is an excellent support within the home for her and spouse.  Patient planted a tomato plant and successfully grew over 5 tomatoes over the summer. Patient is very proud of this achievement as this was her first time planting a tomato plant and is now interested in picking up gardening/planting as a hobby. Patient reports that her aunt continues to be a positive support for her in her life and someone that she can rely on in the case of emergencies.  Patient was  educated on healthy self-care skills and advised her to use these into her daily routine.  Patient reports that she has completed two COVID vaccines and will soon get her booster.  [facility] LCSW completed care coordination with Thomasene Lot on 01/15/21 and [facility] LCSW provided the following suggested resource-It is likely their best option given their loss of housing and lack of consistent income:  Publishing rights manager                                                                                                                                                                              ([facility]) [phone] Ext. 104 M-F, 8am - 4pm Patient was successfully approved for food stamps but is still experiencing financial concerns. She reports that she and her husband have no clothes that fit them and they are having to start using a string for their pants and patient only has one bra at this time. [facility] Ms LCSW will send referral to [facility] BSW today for financial support.  BSW spoke with patient about resources needed. Patient states she does receive SSI and was approved for foodstamps. Patient is familiar with all of the food pantries and other resources in [place]. BSW will assist patient with finding an eye doctor. Patient states she has not been in 10 years. BSW will research and put a list of eye doctors together that accept Medicaid in [facility].  Patient Self Care Activities:  Attends all scheduled provider appointments  Calls provider office for new concerns or questions  Please see past updates related to this goal by clicking on the "Past Updates" button in the selected goal        Follow up:  Patient agrees to Care Plan and Follow-up.  Plan: The Managed Medicaid care management team will reach out to the patient again over the next 7 days.  Date/time of next scheduled Social Work care management/care coordination outreach:02/06/21  Aerilyn Slee,  [place], [place] Medicaid Team  [phone]

## 2021-01-28 NOTE — Patient Instructions (Signed)
Visit Information  Cheryl Chandler was given information about Medicaid Managed Care team care coordination services as a part of their [facility] Medicaid benefit. Cheryl Chandler verbally consented to engagement with the [facility] Managed Care team.   If you are experiencing a medical emergency, please call 911 or report to your local emergency department or urgent care.   If you have a non-emergency medical problem during routine business hours, please contact your provider's office and ask to speak with a nurse.   For questions related to your [facility] health plan, please call: [phone] or go here:[email or web address]  If you would like to schedule transportation through your [facility] plan, please call the following number at least 2 days in advance of your appointment: [phone].  Call the [facility] Crisis Line at [phone], at any time, 24 hours a day, 7 days a week. If you are in danger or need immediate medical attention call 911.  If you would like help to quit smoking, call 1-800-QUIT-NOW ((629)333-9942) OR Espaol: 1-855-Djelo-Ya (4-132-440-1027) o para ms informacin haga clic aqu or Text READY to 253-664 to register via text  Cheryl Chandler - following are the goals we discussed in your visit today:   Goals Addressed   None     Social Worker will follow up with eye doctor resources.   [name], BSW, [facility]  [place]  High Risk Managed Medicaid Team  [phone]   Following is a copy of your plan of care:

## 2021-01-29 ENCOUNTER — Other Ambulatory Visit: Payer: Self-pay | Admitting: Nurse Practitioner

## 2021-01-31 ENCOUNTER — Telehealth: Payer: Self-pay

## 2021-01-31 NOTE — Telephone Encounter (Signed)
Prior Authorization was initiated on CoverMyMeds for Rx Epinephrine Pen. Patient was approved. Left message for patient to inform her that her prior authorization was approved. Advised patient if she has any questions or concerns to give our office a call back.   KEY: [id number]

## 2021-02-05 ENCOUNTER — Emergency Department: Payer: Medicaid Other

## 2021-02-05 ENCOUNTER — Emergency Department
Admission: EM | Admit: 2021-02-05 | Discharge: 2021-02-05 | Disposition: A | Discharge status: Home / Self Care | Payer: Medicaid Other | Attending: Emergency Medicine | Admitting: Emergency Medicine

## 2021-02-05 ENCOUNTER — Ambulatory Visit: Payer: Medicaid Other | Admitting: Nurse Practitioner

## 2021-02-05 ENCOUNTER — Other Ambulatory Visit: Payer: Self-pay

## 2021-02-05 DIAGNOSIS — I10 Essential (primary) hypertension: Secondary | ICD-10-CM | POA: Insufficient documentation

## 2021-02-05 DIAGNOSIS — Z96641 Presence of right artificial hip joint: Secondary | ICD-10-CM | POA: Diagnosis not present

## 2021-02-05 DIAGNOSIS — Z9660 Presence of unspecified orthopedic joint implant: Secondary | ICD-10-CM | POA: Insufficient documentation

## 2021-02-05 DIAGNOSIS — F17210 Nicotine dependence, cigarettes, uncomplicated: Secondary | ICD-10-CM | POA: Diagnosis not present

## 2021-02-05 DIAGNOSIS — Z79899 Other long term (current) drug therapy: Secondary | ICD-10-CM | POA: Diagnosis not present

## 2021-02-05 DIAGNOSIS — J45909 Unspecified asthma, uncomplicated: Secondary | ICD-10-CM | POA: Insufficient documentation

## 2021-02-05 DIAGNOSIS — J449 Chronic obstructive pulmonary disease, unspecified: Secondary | ICD-10-CM | POA: Diagnosis not present

## 2021-02-05 DIAGNOSIS — E1140 Type 2 diabetes mellitus with diabetic neuropathy, unspecified: Secondary | ICD-10-CM | POA: Diagnosis not present

## 2021-02-05 DIAGNOSIS — L03115 Cellulitis of right lower limb: Secondary | ICD-10-CM | POA: Diagnosis not present

## 2021-02-05 DIAGNOSIS — Z91040 Latex allergy status: Secondary | ICD-10-CM | POA: Diagnosis not present

## 2021-02-05 DIAGNOSIS — L97519 Non-pressure chronic ulcer of other part of right foot with unspecified severity: Secondary | ICD-10-CM | POA: Diagnosis not present

## 2021-02-05 DIAGNOSIS — M7989 Other specified soft tissue disorders: Secondary | ICD-10-CM | POA: Diagnosis not present

## 2021-02-05 LAB — LACTIC ACID, PLASMA
Lactic Acid, Venous: 2.3 mmol/L (ref 0.5–1.9)
Lactic Acid, Venous: 2 mmol/L (ref 0.5–1.9)
Lactic Acid, Venous: 1.3 mmol/L (ref 0.5–1.9)

## 2021-02-05 LAB — COMPREHENSIVE METABOLIC PANEL
ALT: 21 U/L (ref 0–44)
AST: 15 U/L (ref 15–41)
Albumin: 3.4 g/dL — ABNORMAL LOW (ref 3.5–5.0)
Alkaline Phosphatase: 77 U/L (ref 38–126)
Anion gap: 11 (ref 5–15)
BUN: 21 mg/dL — ABNORMAL HIGH (ref 6–20)
CO2: 30 mmol/L (ref 22–32)
Calcium: 9 mg/dL (ref 8.9–10.3)
Chloride: 99 mmol/L (ref 98–111)
Creatinine, Ser: 0.74 mg/dL (ref 0.44–1.00)
GFR, Estimated: >60 mL/min (ref >60)
Glucose, Bld: 128 mg/dL — ABNORMAL HIGH (ref 70–99)
Potassium: 3.8 mmol/L (ref 3.5–5.1)
Sodium: 140 mmol/L (ref 135–145)
Total Bilirubin: 0.7 mg/dL (ref 0.3–1.2)
Total Protein: 7.2 g/dL (ref 6.5–8.1)

## 2021-02-05 LAB — CBC WITH DIFFERENTIAL/PLATELET
Abs Immature Granulocytes: 0.03 10*3/uL (ref 0.00–0.07)
Basophils Absolute: 0.1 10*3/uL (ref 0.0–0.1)
Basophils Relative: 1 %
Eosinophils Absolute: 0.2 10*3/uL (ref 0.0–0.5)
Eosinophils Relative: 3 %
HCT: 38 % (ref 36.0–46.0)
Hemoglobin: 12.9 g/dL (ref 12.0–15.0)
Immature Granulocytes: 0 %
Lymphocytes Relative: 23 %
Lymphs Abs: 1.9 10*3/uL (ref 0.7–4.0)
MCH: 31 pg (ref 26.0–34.0)
MCHC: 33.9 g/dL (ref 30.0–36.0)
MCV: 91.3 fL (ref 80.0–100.0)
Monocytes Absolute: 0.5 10*3/uL (ref 0.1–1.0)
Monocytes Relative: 6 %
Neutro Abs: 5.4 10*3/uL (ref 1.7–7.7)
Neutrophils Relative %: 67 %
Platelets: 356 10*3/uL (ref 150–400)
RBC: 4.16 MIL/uL (ref 3.87–5.11)
RDW: 15.7 % — ABNORMAL HIGH (ref 11.5–15.5)
WBC: 8.1 10*3/uL (ref 4.0–10.5)
nRBC: 0 % (ref 0.0–0.2)

## 2021-02-05 LAB — URINALYSIS, COMPLETE (UACMP) WITH MICROSCOPIC
Bacteria, UA: NONE SEEN
Bilirubin Urine: NEGATIVE
Glucose, UA: NEGATIVE mg/dL
Hgb urine dipstick: NEGATIVE
Ketones, ur: NEGATIVE mg/dL
Leukocytes,Ua: NEGATIVE
Nitrite: NEGATIVE
Protein, ur: NEGATIVE mg/dL
Specific Gravity, Urine: 1.006 (ref 1.005–1.030)
pH: 7 (ref 5.0–8.0)

## 2021-02-05 MED ORDER — SODIUM CHLORIDE 0.9 % IV BOLUS
1000.0000 mL | Freq: Once | INTRAVENOUS | Status: AC
  Administered 2021-02-05: 1000 mL via INTRAVENOUS

## 2021-02-05 MED ORDER — IBUPROFEN 400 MG PO TABS
400.0000 mg | ORAL_TABLET | Freq: Once | ORAL | Status: AC
  Administered 2021-02-05: 400 mg via ORAL
  Filled 2021-02-05: qty 1

## 2021-02-05 MED ORDER — DOXYCYCLINE HYCLATE 100 MG PO TBEC: 100.0000 mg | DELAYED_RELEASE_TABLET | Freq: Two times a day (BID) | ORAL | 0 refills | Status: AC

## 2021-02-05 NOTE — ED Triage Notes (Signed)
Pt comes pov with right diabetic foot ulcer. Pt states it's not getting better. States she is seeing podiatrist in [place] but came here instead. Has been on antibiotic for 3 rounds. Area is red and slightly swollen, no discharge or necrosis.

## 2021-02-05 NOTE — ED Provider Notes (Signed)
[facility]  ____________________________________________   Event Date/Time   First MD Initiated Contact with Patient 02/05/21 1246     (approximate)  I have reviewed the triage vital signs and the nursing notes.   HISTORY  Chief Complaint Foot Ulcer    HPI Cheryl Chandler is a 55 y.o. female type 2 diabetes complicated by diabetic neuropathy, COPD, asthma who presents with concern for foot wound.  Patient notes that she has had a wound on her right heel for some time.  Has had about 3 rounds of antibiotics.  Cannot recall when her last dose of antibiotics was.  She does follow with podiatry but does not see them again for several months.  She feels like the wound is getting worse.  Patient is wheelchair-bound at baseline, she is unable to see the wound.  She does have worsening pain.  Denies fevers or chills.           Past Medical History:  Diagnosis Date   Arthritis    Asthma    COPD (chronic obstructive pulmonary disease) ([place])    Hypertension    Neuropathy    Pericarditis    Personal history of urinary calculi 11/29/2014   Thyroid disease     Patient Active Problem List   Diagnosis Date Noted   Fibrocystic disease of both breasts 05/07/2019   IFG (impaired fasting glucose) 05/03/2019   Chronic venous insufficiency 11/09/2018   Overactive bladder 09/30/2018   Peripheral neuropathy 09/30/2018   Apnea 01/02/2016   Lymphedema 11/30/2014   Dyspnea on exertion 11/30/2014   Anxiety 11/29/2014   Arthritis 11/29/2014   COPD (chronic obstructive pulmonary disease) ([place]) 11/29/2014   Clinical depression 11/29/2014   H/O eating disorder 11/29/2014   Esophageal reflux 11/29/2014   Acne inversa 11/29/2014   Essential hypertension 11/29/2014   HLD (hyperlipidemia) 11/29/2014   Adult hypothyroidism 11/29/2014   Insomnia 11/29/2014   Low back pain 11/29/2014   Morbid obesity ([place]) 11/29/2014   Posttraumatic stress disorder 11/29/2014    Vitamin D deficiency 11/29/2014   Nicotine dependence, cigarettes, uncomplicated 05/39/7673    Past Surgical History:  Procedure Laterality Date   APPENDECTOMY     arm surgery Left    pins   fatty tumor removed from back     JOINT REPLACEMENT Right    LEG SURGERY Bilateral    OVARIAN CYST SURGERY Right    took piece of right ovary due to cyst   TOTAL HIP ARTHROPLASTY Right    wisdom teeth removed      Prior to Admission medications   Medication Sig Start Date End Date Taking? Authorizing Provider  doxycycline (DORYX) 100 MG EC tablet Take 1 tablet (100 mg total) by mouth 2 (two) times daily for 10 days. 02/05/21 02/15/21 Yes Rada Hay, MD  ACCU-CHEK GUIDE test strip  08/02/20   [provider]  Accu-Chek Softclix Lancets lancets CHECK BLOOD SUGAR TWICE DAILY 11/27/20   [name], NP  albuterol (PROVENTIL) (2.5 MG/3ML) 0.083% nebulizer solution INHALE CONTENTS OF 1 VIAL IN NEBULIZER EVERY 4 HOURS AS NEEDED 10/11/20   [name], NP  amLODipine (NORVASC) 5 MG tablet Take 2 tablets (10 mg total) by mouth daily. 12/13/20   Johnson, [name], DO  atorvastatin (LIPITOR) 80 MG tablet Take 1 tablet (80 mg total) by mouth daily. 10/30/20   [name], NP  Blood Glucose Monitoring Suppl (ACCU-CHEK GUIDE) w/Device KIT Use to check blood sugar 3 times a day. 12/11/20  [name], NP  carvedilol (COREG) 3.125 MG tablet Take 1 tablet (3.125 mg total) by mouth 2 (two) times daily with a meal. 04/03/20   Rumball, Jake Church, DO  citalopram (CELEXA) 20 MG tablet TAKE 1 TABLET BY MOUTH ONCE DAILY 09/20/20   [name], NP  EPINEPHrine 0.3 mg/0.3 mL IJ SOAJ injection USE AS DIRECTED. 09/27/20   [name], NP  furosemide (LASIX) 20 MG tablet TAKE 1 TABLET BY MOUTH ONCE DAILY IN AFTERNOON AS NEEDED LEG EDEMA 01/29/21   [name], NP  furosemide (LASIX) 40 MG tablet Take 1 tablet (40 mg total) by mouth daily. 05/03/19   [name], PA-C   gabapentin (NEURONTIN) 300 MG capsule TAKE 1 CAPSULE BY MOUTH 3 TIMES DAILY ASNEEDED (NEED OFFICE VISIT FOR FURTHER REFILLS) 01/29/21   [name], NP  levothyroxine (SYNTHROID) 50 MCG tablet TAKE 1 TABLET BY MOUTH ONCE DAILY ON AN EMPTY STOMACH. WAIT 30 MINUTES BEFORE TAKING OTHER MEDS. 10/30/20   [name], NP  lisinopril (ZESTRIL) 40 MG tablet TAKE 1 TABLET BY MOUTH ONCE DAILY 11/26/20   [name], NP  meloxicam (MOBIC) 15 MG tablet TAKE 1 TABLET BY MOUTH ONCE DAILY AS NEEDED WITH FOOD OR MILK 12/31/20   [name], NP  methocarbamol (ROBAXIN) 750 MG tablet TAKE 1-2 TABLETS BY MOUTH 3 TIMES DAILY AS NEEDED 02/02/20   [name], [name], NP  mometasone-formoterol (DULERA) 100-5 MCG/ACT AERO INHALE 2 PUFFS BY MOUTH TWICE A DAY. RINSE MOUTH AFTER USE. 05/03/19   [name], PA-C  montelukast (SINGULAIR) 10 MG tablet Take 1 tablet (10 mg total) by mouth at bedtime. 10/30/20   [name], NP  nitroGLYCERIN (NITROSTAT) 0.4 MG SL tablet DISSOLVE 1 TABLET UNDER TONGUE AT ONSET OF CHEST PAIN. REPEAT IN 5 MIN IF NOT RESOLVED, MAX 3 DOSES. 911 IF NEEDED. 11/26/20   [name], NP  omeprazole (PRILOSEC) 20 MG capsule Take 1 capsule (20 mg total) by mouth daily. 10/30/20   [name], NP  QUEtiapine (SEROQUEL) 25 MG tablet TAKE 1 TABLET BY MOUTH AT BEDTIME 06/04/20   [name], NP  SPIRIVA HANDIHALER 18 MCG inhalation capsule INHALE CONTENTS OF 1 CAPSULE ONCE DAILY 12/27/20   [name], NP  triamcinolone cream (KENALOG) 0.1 % APPLY 1 APPLICATION TOPICALLY TWICE DAILY 02/02/20   [name], NP  TRULICITY 2.58 NI/7.7OE SOPN INJECT 0.75MG SUBQ ONCE A WEEK 09/20/20   [name], NP  Vitamin D, Ergocalciferol, (DRISDOL) 1.25 MG (50000 UNIT) CAPS capsule TAKE 1 CAPSULE BY MOUTH ONCE EVERY 7 DAYS 10/30/20   [name], NP    Allergies Codeine, Percocet [oxycodone-acetaminophen], Sulfa antibiotics, Aspirin, Bee venom, Darvon [propoxyphene],  Latex, Oxycodone, and Penicillins  Family History  Problem Relation Age of Onset   Seizures Sister        disorders    Social History Social History   Tobacco Use   Smoking status: Every Day    Packs/day: 0.25    Years: 30.00    Pack years: 7.50    Types: Cigarettes   Smokeless tobacco: Never   Tobacco comments:    Patient quit smoking and recently started back-08/08/2019  Vaping Use   Vaping Use: Never used  Substance Use Topics   Alcohol use: No    Alcohol/week: 0.0 standard drinks   Drug use: No    Review of Systems   Review of Systems  Constitutional:  Negative for appetite change, chills and fever.  Skin:  Positive for wound.  All other systems reviewed and are negative.  Physical Exam Updated Vital Signs BP (!) 151/73   Pulse 75   Temp 98.3 F (36.8 C) (Oral)   Resp 18   Ht _0  (1.626 m)   Wt 131.5 kg   LMP 06/09/2017 (Approximate)   SpO2 98%   BMI 49.78 kg/m   Physical Exam Vitals and nursing note reviewed.  Constitutional:      General: She is not in acute distress.    Appearance: Normal appearance.  HENT:     Head: Normocephalic and atraumatic.  Eyes:     General: No scleral icterus.    Conjunctiva/sclera: Conjunctivae normal.  Pulmonary:     Effort: Pulmonary effort is normal. No respiratory distress.     Breath sounds: No stridor.  Musculoskeletal:        General: No deformity or signs of injury.     Cervical back: Normal range of motion.     Comments: Bilateral lymphedema of the lower extremities  Right heel with small area of erythema, no open wound, it is tender to palpation, no crepitus or drainage, no lymphatic streaking  Skin:    General: Skin is dry.     Coloration: Skin is not jaundiced or pale.  Neurological:     General: No focal deficit present.     Mental Status: She is alert and oriented to person, place, and time. Mental status is at baseline.  Psychiatric:        Mood and Affect: Mood normal.        Behavior:  Behavior normal.     LABS (all labs ordered are listed, but only abnormal results are displayed)  Labs Reviewed  LACTIC ACID, PLASMA - Abnormal; Notable for the following components:      Result Value   Lactic Acid, Venous 2.3 (*)    All other components within normal limits  LACTIC ACID, PLASMA - Abnormal; Notable for the following components:   Lactic Acid, Venous 2.0 (*)    All other components within normal limits  COMPREHENSIVE METABOLIC PANEL - Abnormal; Notable for the following components:   Glucose, Bld 128 (*)    BUN 21 (*)    Albumin 3.4 (*)    All other components within normal limits  CBC WITH DIFFERENTIAL/PLATELET - Abnormal; Notable for the following components:   RDW 15.7 (*)    All other components within normal limits  URINALYSIS, COMPLETE (UACMP) WITH MICROSCOPIC - Abnormal; Notable for the following components:   Color, Urine STRAW (*)    APPearance CLEAR (*)    All other components within normal limits  LACTIC ACID, PLASMA  POC URINE PREG, ED   ____________________________________________  EKG  B/a ____________________________________________  RADIOLOGY Almeta Monas, personally viewed and evaluated these images (plain radiographs) as part of my medical decision making, as well as reviewing the written report by the radiologist.  ED MD interpretation: I reviewed the x-ray of the right foot which does not show gas or evidence of osteomyelitis    ____________________________________________   PROCEDURES  Procedure(s) performed (including Critical Care):  Procedures   ____________________________________________   INITIAL IMPRESSION / ASSESSMENT AND PLAN / ED COURSE     The patient is a 55 year old female with type 2 diabetes who presents with concern for worsening right foot wound.  Patient's vital signs are within normal limits she is afebrile not tachycardic.  Overall appears somewhat chronically ill but nontoxic.  She has  chronic lymphedema in her lower  extremities.  She is concerned about the right heel which to me is not very impressive on exam.  There is an area of erythema that is tender on the plantar surface of the heel but there is no open wound, no crepitus no drainage or no evidence of surrounding cellulitis.  Labs were obtained from triage which are normal except for a lactate of 2.3.  She has heart rate of 91 which is technically 1 SIRS criteria but no additional.  X-ray was obtained which is read as dorsal soft tissue swelling but no evidence of osteomyelitis.  Her pain and redness are not in the dorsum.  Review of prior x-rays there has been this dorsal swelling before as well.  I am not concerned clinically for sepsis based on this wound.  Repeat lactate after fluids is normal at 1.3.   I do feel like she is appropriate for outpatient management with p.o. antibiotics.  Patient has multiple allergies including anaphylaxis to penicillin and sulfa.  We will write for 10 days of doxycycline.  She will need to follow-up with podiatry which I discussed with her.  We discussed return precautions for fevers spreading of erythema.  Clinical Course as of 02/05/21 1540  Wed Feb 05, 2021  1533 Lactic Acid, Venous: 1.3 [KM]    Clinical Course User Index [KM] Rada Hay, MD     ____________________________________________   FINAL CLINICAL IMPRESSION(S) / ED DIAGNOSES  Final diagnoses:  Cellulitis of right lower extremity     ED Discharge Orders          Ordered    doxycycline (DORYX) 100 MG EC tablet  2 times daily        02/05/21 1539             Note:  This document was prepared using Dragon voice recognition software and may include unintentional dictation errors.    Rada Hay, MD 02/05/21 1540

## 2021-02-05 NOTE — Discharge Instructions (Addendum)
It looks like you have a mild infection of your right heel.  Please take the antibiotics twice daily for the next 10 days.  If you develop fevers or there is redness that is extending past her heel, please return to the emergency department.

## 2021-02-05 NOTE — Progress Notes (Deleted)
LMP 06/09/2017 (Approximate)    Subjective:    Patient ID: Cheryl Chandler, female    DOB: [birth year], 54 y.o.   MRN: [id number]  HPI: Cheryl Chandler is a 55 y.o. female  No chief complaint on file.  HYPERTENSION / HYPERLIPIDEMIA Satisfied with current treatment? {Blank single:19197::"yes","no"} Duration of hypertension: {Blank single:19197::"chronic","months","years"} BP monitoring frequency: {Blank single:19197::"not checking","rarely","daily","weekly","monthly","a few times a day","a few times a week","a few times a month"} BP range:  BP medication side effects: {Blank single:19197::"yes","no"} Past BP meds: {Blank multiple:19196::"none","amlodipine","amlodipine/benazepril","atenolol","benazepril","benazepril/HCTZ","bisoprolol (bystolic)","carvedilol","chlorthalidone","clonidine","diltiazem","exforge HCT","HCTZ","irbesartan (avapro)","labetalol","lisinopril","lisinopril-HCTZ","losartan (cozaar)","methyldopa","nifedipine","olmesartan (benicar)","olmesartan-HCTZ","quinapril","ramipril","spironalactone","tekturna","valsartan","valsartan-HCTZ","verapamil"} Duration of hyperlipidemia: {Blank single:19197::"chronic","months","years"} Cholesterol medication side effects: {Blank single:19197::"yes","no"} Cholesterol supplements: {Blank multiple:19196::"none","fish oil","niacin","red yeast rice"} Past cholesterol medications: {Blank multiple:19196::"none","atorvastain (lipitor)","lovastatin (mevacor)","pravastatin (pravachol)","rosuvastatin (crestor)","simvastatin (zocor)","vytorin","fenofibrate (tricor)","gemfibrozil","ezetimide (zetia)","niaspan","lovaza"} Medication compliance: {Blank single:19197::"excellent compliance","good compliance","fair compliance","poor compliance"} Aspirin: {Blank single:19197::"yes","no"} Recent stressors: {Blank single:19197::"yes","no"} Recurrent headaches: {Blank single:19197::"yes","no"} Visual changes: {Blank  single:19197::"yes","no"} Palpitations: {Blank single:19197::"yes","no"} Dyspnea: {Blank single:19197::"yes","no"} Chest pain: {Blank single:19197::"yes","no"} Lower extremity edema: {Blank single:19197::"yes","no"} Dizzy/lightheaded: {Blank single:19197::"yes","no"}   COPD COPD status: {Blank single:19197::"controlled","uncontrolled","better","worse","exacerbated","stable"} Satisfied with current treatment?: {Blank single:19197::"yes","no"} Oxygen use: {Blank single:19197::"yes","no"} Dyspnea frequency:  Cough frequency:  Rescue inhaler frequency:   Limitation of activity: {Blank single:19197::"yes","no"} Productive cough:  Last Spirometry:  Pneumovax: {Blank single:19197::"Up to Date","Not up to Date","unknown"} Influenza: {Blank single:19197::"Up to Date","Not up to Date","unknown"}  HYPOTHYROIDISM Thyroid control status:{Blank single:19197::"controlled","uncontrolled","better","worse","exacerbated","stable"} Satisfied with current treatment? {Blank single:19197::"yes","no"} Medication side effects: {Blank single:19197::"yes","no"} Medication compliance: {Blank single:19197::"excellent compliance","good compliance","fair compliance","poor compliance"} Etiology of hypothyroidism:  Recent dose adjustment:{Blank single:19197::"yes","no"} Fatigue: {Blank single:19197::"yes","no"} Cold intolerance: {Blank single:19197::"yes","no"} Heat intolerance: {Blank single:19197::"yes","no"} Weight gain: {Blank single:19197::"yes","no"} Weight loss: {Blank single:19197::"yes","no"} Constipation: {Blank single:19197::"yes","no"} Diarrhea/loose stools: {Blank single:19197::"yes","no"} Palpitations: {Blank single:19197::"yes","no"} Lower extremity edema: {Blank single:19197::"yes","no"} Anxiety/depressed mood: {Blank single:19197::"yes","no"}  Relevant past medical, surgical, family and social history reviewed and updated as indicated. Interim medical history since our last visit  reviewed. Allergies and medications reviewed and updated.  Review of Systems  Constitutional: Negative.   Respiratory: Negative.    Cardiovascular: Negative.   Gastrointestinal: Negative.   Musculoskeletal: Negative.   Neurological: Negative.   Psychiatric/Behavioral: Negative.     Per HPI unless specifically indicated above     Objective:    LMP 06/09/2017 (Approximate)   Wt Readings from Last 3 Encounters:  11/20/20 290 lb (131.5 kg)  08/05/20 300 lb (136.1 kg)  12/06/19 (!) 316 lb (143.3 kg)    Physical Exam Vitals and nursing note reviewed.  Pulmonary:     Effort: Pulmonary effort is normal. No respiratory distress.     Comments: Speaking in full sentences Neurological:     Mental Status: She is alert.  Psychiatric:        Mood and Affect: Mood normal.        Behavior: Behavior normal.        Thought Content: Thought content normal.        Judgment: Judgment normal.    Results for orders placed or performed during the hospital encounter of 11/20/20  CBC  Result Value Ref Range   WBC 10.6 (H) 4.0 - 10.5 K/uL   RBC 4.40 3.87 - 5.11 MIL/uL   Hemoglobin 13.4 12.0 - 15.0 g/dL   HCT 50.2 77.4 - 12.8 %   MCV 91.6 80.0 - 100.0 fL   MCH 30.5 26.0 - 34.0 pg   MCHC 33.3 30.0 - 36.0 g/dL   RDW 78.6 76.7 - 20.9 %   Platelets 341 150 - 400 K/uL   nRBC 0.0 0.0 - 0.2 %  Basic metabolic panel  Result Value Ref Range   Sodium 138 135 - 145 mmol/L   Potassium 3.9 3.5 - 5.1 mmol/L   Chloride 100 98 - 111 mmol/L   CO2 26 22 - 32 mmol/L   Glucose, Bld 99 70 - 99 mg/dL   BUN 16 6 - 20 mg/dL   Creatinine, Ser 4.96 0.44 - 1.00 mg/dL   Calcium 8.6 (L) 8.9 - 10.3 mg/dL   GFR, Estimated >75 >91 mL/min   Anion gap 12 5 - 15  Lactic acid, plasma  Result Value Ref Range   Lactic Acid, Venous 1.9 0.5 - 1.9 mmol/L      Assessment & Plan:   Problem List Items Addressed This Visit       Cardiovascular and Mediastinum   Essential hypertension - Primary     Respiratory    COPD (chronic obstructive pulmonary disease) (HCC)     Endocrine   Adult hypothyroidism   IFG (impaired fasting glucose)     Other   Anxiety   HLD (hyperlipidemia)   Vitamin D deficiency     Follow up plan: No follow-ups on file.   This visit was completed via telephone due to the restrictions of the COVID-19 pandemic. All issues as above were discussed and addressed but no physical exam was performed. If it was felt that the patient should be evaluated in the office, they were directed there. The patient verbally consented to this visit. Patient was unable to complete an audio/visual visit due to Lack of equipment. Due to the catastrophic nature of the COVID-19 pandemic, this visit was done through audio contact only. Location of the patient: home Location of the provider: work Those involved with this call:  Provider: Olevia Perches, DO CMA: Tristan Schroeder, CMA Front Desk/Registration: Kandice Hams  Time spent on call:  15 minutes on the phone discussing health concerns. 23 minutes total spent in review of patient's record and preparation of their chart.

## 2021-02-06 ENCOUNTER — Telehealth: Payer: Self-pay

## 2021-02-06 ENCOUNTER — Other Ambulatory Visit: Payer: Self-pay

## 2021-02-06 NOTE — Telephone Encounter (Signed)
Transition Care Management Follow-up Telephone Call Date of discharge and from where: 02/05/2021 from [facility] How have you been since you were released from the hospital? Pt stated that she is feeling some better. She is elevating her leg and taking the abx that was prescribed.  Any questions or concerns? No  Items Reviewed: Did the pt receive and understand the discharge instructions provided? Yes  Medications obtained and verified? Yes  Other? No  Any new allergies since your discharge? No  Dietary orders reviewed? No Do you have support at home? Yes   Functional Questionnaire: (I = Independent and D = Dependent) ADLs: I  Bathing/Dressing- I  Meal Prep- I  Eating- I  Maintaining continence- I  Transferring/Ambulation- I  Managing Meds- I   Follow up appointments reviewed:  PCP Hospital f/u appt confirmed? Yes  Scheduled to see [name], NP on 03/05/2021 @ 3:20pm. Specialist Hospital f/u appt confirmed? No   Are transportation arrangements needed? No  If their condition worsens, is the pt aware to call PCP or go to the Emergency Dept.? Yes Was the patient provided with contact information for the PCP's office or ED? Yes Was to pt encouraged to call back with questions or concerns? Yes

## 2021-02-06 NOTE — Patient Instructions (Signed)
Visit Information  Ms. Baelyn Mechell Girgis  - as a part of your Medicaid benefit, you are eligible for care management and care coordination services at no cost or copay. I was unable to reach you by phone today but would be happy to help you with your health related needs. Please feel free to call me @ [phone].   A member of the Managed Medicaid care management team will reach out to you again over the next 14 days.   [name], BSW, [place] [facility]  [employer] Risk Managed Medicaid Team  [phone]

## 2021-02-06 NOTE — Patient Outreach (Signed)
Care Coordination  02/06/2021  Cheryl Chandler [birth year] [id number]   Medicaid Managed Care   Unsuccessful Outreach Note  02/06/2021 Name: Cheryl Chandler MRN: [id number] DOB: Apr 22, 1966  Referred by: [name], NP Reason for referral : High Risk Managed Medicaid (MM Social Work unsuccessful [employer])   An unsuccessful telephone outreach was attempted today. The patient was referred to the case management team for assistance with care management and care coordination.   Follow Up Plan: The care management team will reach out to the patient again over the next 14 days.  [name], BSW, [facility]  [employer] Risk Managed Medicaid Team  [phone]

## 2021-02-12 DIAGNOSIS — R3981 Functional urinary incontinence: Secondary | ICD-10-CM | POA: Diagnosis not present

## 2021-02-12 DIAGNOSIS — Z993 Dependence on wheelchair: Secondary | ICD-10-CM | POA: Diagnosis not present

## 2021-02-18 ENCOUNTER — Other Ambulatory Visit: Payer: Self-pay

## 2021-02-18 NOTE — Patient Outreach (Signed)
[facility]) Care Management  02/18/2021  Cheryl Chandler 09/22/1965 [id number]  LCSW completed [facility] outreach attempt today but was unable to reach patient successfully. A HIPPA compliant voice message was left encouraging patient to return call once available. LCSW will ask Scheduling Care Guide to reschedule [facility] SW appointment with patient as well.  [name], BSW, MSW, [employer] Managed Medicaid LCSW [facility]  [facility] [email or web address] Phone: [phone]

## 2021-02-18 NOTE — Patient Instructions (Signed)
Cheryl Chandler ,   [facility] Managed Care Team is available to provide assistance to you with your healthcare needs at no cost and as a benefit of your [facility] Health plan. I'm sorry I was unable to reach you today for our scheduled appointment. Our care guide will call you to reschedule our telephone appointment. Please call me at the number below. I am available to be of assistance to you regarding your healthcare needs. .   Thank you,   [name], BSW, MSW, LCSW Managed Medicaid LCSW [facility]  [address] [email or web address] Phone: [phone]

## 2021-02-21 ENCOUNTER — Other Ambulatory Visit: Payer: Self-pay | Admitting: Obstetrics and Gynecology

## 2021-02-21 ENCOUNTER — Other Ambulatory Visit: Payer: Self-pay

## 2021-02-21 NOTE — Patient Outreach (Signed)
Medicaid Managed Care   Nurse Care Manager Note  02/21/2021 Name:  Cheryl Chandler MRN:  [id number] DOB:  06-14-1965  Cheryl Chandler is an 55 y.o. year old female who is a primary patient of [name], NP.  The [facility] Managed Care Coordination team was consulted for assistance with:    Chronic healthcare management needs.  [name] was given information about Medicaid Managed Care Coordination team services today. [name] agreed to services and verbal consent obtained.  Engaged with patient by telephone for follow up visit in response to provider referral for case management and/or care coordination services.   Assessments/Interventions:  Review of past medical history, allergies, medications, health status, including review of consultants reports, laboratory and other test data, was performed as part of comprehensive evaluation and provision of chronic care management services.  SDOH (Social Determinants of Health) assessments and interventions performed:   Care Plan  Allergies  Allergen Reactions   Codeine Shortness Of Breath and Rash   Percocet [Oxycodone-Acetaminophen] Shortness Of Breath   Sulfa Antibiotics Shortness Of Breath and Rash   Aspirin Hives   Bee Venom     Bee stings   Darvon [Propoxyphene]     Darvocet-N 100   Latex    Oxycodone Nausea And Vomiting   Penicillins     Has patient had a PCN reaction causing immediate rash, facial/tongue/throat swelling, SOB or lightheadedness with hypotension: Unknown Has patient had a PCN reaction causing severe rash involving mucus membranes or skin necrosis: Unknown Has patient had a PCN reaction that required hospitalization: Unknown Has patient had a PCN reaction occurring within the last 10 years: Unknown If all of the above answers are "NO", then may proceed with Cephalosporin use.    Medications Reviewed Today     Reviewed by [name], RN (Registered Nurse) on  02/21/21 at 16  Med List Status: <None>   Medication Order Taking? Sig Documenting Provider Last Dose Status Informant  ACCU-CHEK GUIDE test strip [id number] No  [provider] Taking Active   Accu-Chek Softclix Lancets lancets [id number] No CHECK BLOOD SUGAR TWICE DAILY [name], NP Taking Active   albuterol (PROVENTIL) (2.5 MG/3ML) 0.083% nebulizer solution [id number] No INHALE CONTENTS OF 1 VIAL IN NEBULIZER EVERY 4 HOURS AS NEEDED [name], NP Taking Active   amLODipine (NORVASC) 5 MG tablet [id number]  Take 2 tablets (10 mg total) by mouth daily. Johnson, [name], DO  Active   atorvastatin (LIPITOR) 80 MG tablet [id number] No Take 1 tablet (80 mg total) by mouth daily. [name], NP Taking Active   Blood Glucose Monitoring Suppl (ACCU-CHEK GUIDE) w/Device KIT [id number] No Use to check blood sugar 3 times a day. [name], NP Taking Active   carvedilol (COREG) 3.125 MG tablet [id number] No Take 1 tablet (3.125 mg total) by mouth 2 (two) times daily with a meal. [name], DO Taking Active   citalopram (CELEXA) 20 MG tablet [id number] No TAKE 1 TABLET BY MOUTH ONCE DAILY [name], NP Taking Active   EPINEPHrine 0.3 mg/0.3 mL IJ SOAJ injection [id number] No USE AS DIRECTED. [name], NP Taking Active   furosemide (LASIX) 20 MG tablet [id number]  TAKE 1 TABLET BY MOUTH ONCE DAILY IN AFTERNOON AS NEEDED LEG EDEMA [name], NP  Active   furosemide (LASIX) 40 MG tablet [id number] No Take 1 tablet (40 mg total) by mouth daily. [name], [place] Taking Active  Med Note [name] Oct 14, 2020  1:41 PM) Told to take in AM, not afternoon  gabapentin (NEURONTIN) 300 MG capsule [id number]  TAKE 1 CAPSULE BY MOUTH 3 TIMES DAILY ASNEEDED (NEED OFFICE VISIT FOR FURTHER REFILLS) [name], NP  Active   levothyroxine (SYNTHROID) 50 MCG tablet [id number] No TAKE 1 TABLET BY MOUTH ONCE DAILY ON AN EMPTY  STOMACH. WAIT 30 MINUTES BEFORE TAKING OTHER MEDS. [name], NP Taking Active   lisinopril (ZESTRIL) 40 MG tablet [id number] No TAKE 1 TABLET BY MOUTH ONCE DAILY [name], NP Taking Active   meloxicam (MOBIC) 15 MG tablet [id number]  TAKE 1 TABLET BY MOUTH ONCE DAILY AS NEEDED WITH FOOD OR MILK [name], NP  Active   methocarbamol (ROBAXIN) 750 MG tablet [id number] No TAKE 1-2 TABLETS BY MOUTH 3 TIMES DAILY AS NEEDED [name], [name], NP Taking Active   mometasone-formoterol (DULERA) 100-5 MCG/ACT AERO [id number] No INHALE 2 PUFFS BY MOUTH TWICE A DAY. RINSE MOUTH AFTER USE. [name], PA-C Taking Active   montelukast (SINGULAIR) 10 MG tablet [id number] No Take 1 tablet (10 mg total) by mouth at bedtime. [name], NP Taking Active   nitroGLYCERIN (NITROSTAT) 0.4 MG SL tablet [id number] No DISSOLVE 1 TABLET UNDER TONGUE AT ONSET OF CHEST PAIN. REPEAT IN 5 MIN IF NOT RESOLVED, MAX 3 DOSES. 911 IF NEEDED. [name], NP Taking Active   omeprazole (PRILOSEC) 20 MG capsule [id number] No Take 1 capsule (20 mg total) by mouth daily. [name], NP Taking Active   QUEtiapine (SEROQUEL) 25 MG tablet [id number] No TAKE 1 TABLET BY MOUTH AT BEDTIME [name], NP Taking Active   SPIRIVA HANDIHALER 18 MCG inhalation capsule [id number]  INHALE CONTENTS OF 1 CAPSULE ONCE DAILY [name], NP  Active   triamcinolone cream (KENALOG) 0.1 % [id number] No APPLY 1 APPLICATION TOPICALLY TWICE DAILY [name], NP Taking Active   TRULICITY 9.02 XJ/1.5ZM SOPN [id number] No INJECT 0.75MG SUBQ ONCE A WEEK [name], NP Taking Active   Vitamin D, Ergocalciferol, (DRISDOL) 1.25 MG (50000 UNIT) CAPS capsule [id number] No TAKE 1 CAPSULE BY MOUTH ONCE EVERY 7 DAYS [name], NP Taking Active             Patient Active Problem List   Diagnosis Date Noted   Fibrocystic disease of both breasts 05/07/2019   IFG (impaired fasting glucose)  05/03/2019   Chronic venous insufficiency 11/09/2018   Overactive bladder 09/30/2018   Peripheral neuropathy 09/30/2018   Apnea 01/02/2016   Lymphedema 11/30/2014   Dyspnea on exertion 11/30/2014   Anxiety 11/29/2014   Arthritis 11/29/2014   COPD (chronic obstructive pulmonary disease) ([place]) 11/29/2014   Clinical depression 11/29/2014   H/O eating disorder 11/29/2014   Esophageal reflux 11/29/2014   Acne inversa 11/29/2014   Essential hypertension 11/29/2014   HLD (hyperlipidemia) 11/29/2014   Adult hypothyroidism 11/29/2014   Insomnia 11/29/2014   Low back pain 11/29/2014   Morbid obesity ([place]) 11/29/2014   Posttraumatic stress disorder 11/29/2014   Vitamin D deficiency 11/29/2014   Nicotine dependence, cigarettes, uncomplicated 09/04/209    Conditions to be addressed/monitored per PCP order:   chronic healthcare management needs, COPD, DM2, tobacco use, PTSD, LBP, HLD, HTN, depression, arthritis, anxiety, lymphedema  Care Plan : RNCM: COPD (Adult)  Updates made by [name], RN since 02/21/2021 12:00 AM     Problem: RNCM: Psychological Adjustment to Diagnosis (COPD)   Priority: Medium  Onset Date: 05/22/2020  Long-Range Goal: RNCM: Adjustment to Disease Achieved   Start Date: 05/22/2020  Expected End Date: 03/27/2022  Recent Progress: On track  Priority: High  Note:   Current Barriers:  Knowledge deficits related to basic understanding of COPD disease process Knowledge deficits related to basic COPD self care/management Knowledge deficit related to basic understanding of how to use inhalers and how inhaled medications work Knowledge deficit related to importance of energy conservation Limited Social Support Unable to independently manage COPD Lacks social connections Does not contact provider office for questions/concerns  Case Manager Clinical Goal(s): patient will report using inhalers as prescribed including rinsing mouth after use patient will be able  to verbalize understanding of COPD action plan and when to seek appropriate levels of medical care  patient will engage in lite exercise as tolerated to build/regain stamina and strength and reduce shortness of breath through activity tolerance  patient will verbalize basic understanding of COPD disease process and self care activities  Interventions:  Collaboration with [name], NP regarding development and update of comprehensive plan of care as evidenced by provider attestation and co-signature Inter-disciplinary care team collaboration (see longitudinal plan of care) UNABLE to independently: manage COPD Provided patient with basic written and verbal COPD education on self care/management/and exacerbation prevention. 12-02-2020: The patient states she is not feeling well. Was in the ER recently for an infection in her right foot. The patient states that she needs to go to the wound clinic but her insurance will not pay for it. Has a follow up with the pcp on 12-04-2020. Advised the patient to discuss with the pcp her options and also talk about her change in her breathing. The patient has been taking her pulse ox readings and states that her levels have been low and in the 80's. The patient denies any acute breathing issues today but feels her health is not in the best place right now.  Update 12/26/20:  Patient with no breathing complaint today-checks oxygen saturations as needed. Update 01/22/21:  no complaints today. Update 02/21/21:  patient has SOB and uses inhaler as needed-Oxygen Sat to day 97% Provided patient with COPD action plan and reinforced importance of daily self assessment. 12-02-2020: Education and support given.  Provided written and verbal instructions on pursed lip breathing and utilized returned demonstration as teach back Provided instruction about proper use of medications used for management of COPD including inhalers Advised patient to self assesses COPD action plan zone  and make appointment with provider if in the yellow zone for 48 hours without improvement. Provided patient with education about the role of exercise in the management of COPD Advised patient to engage in light exercise as tolerated 3-5 days a week Provided education about and advised patient to utilize infection prevention strategies to reduce risk of respiratory infection. 12-02-2020: The patient denies any pulmonary infections, is currently dealing with a foot infections. Is on abx and will follow up with the pcp on 12-04-2020.  Patient Goals/Self-Care Activities:  - decision-making supported - depression screen reviewed - emotional support provided - family involvement promoted - problem-solving facilitated - relaxation techniques promoted - verbalization of feelings encouraged Follow Up Plan: Telephone follow up appointment with care management team member scheduled for: 12-02-2020: The patient transitioning to the [facility] team and will be followed by the CCM team for [facility]. The patient knows to expect a call from the scheduler to schedule with [facility] Update 02/21/21:  patient has follow up appt with PCP 03/04/21.    Problem:  RNCM: Symptom Exacerbation (COPD)   Priority: High  Onset Date: 05/22/2020     Long-Range Goal: RNCM: Smoking Cessation Symptom Exacerbation Prevented or Minimized   Start Date: 05/22/2020  Expected End Date: 03/27/2022  Recent Progress: Not on track  Priority: High  Note:   Current Barriers:  Unable to independently stop smoking  Does not adhere to provider recommendations re: smoking cessation  Lacks social connections Does not contact provider office for questions/concerns Tobacco abuse of >40 years; currently smoking 1 pack every 2 weeks Previous quit attempts, unsuccessful several successful using patches, gum- has a pattern of stopping and then starting back  Reports smoking within 30 minutes of waking up Reports triggers to smoke include: stress, chronic  conditions  Reports motivation to quit smoking includes: knows it will help her breathing and other chronic conditions  On a scale of 1-10, reports MOTIVATION to quit is 8 On a scale of 1-10, reports CONFIDENCE in quitting is 7 Update 02/21/21:  patient with SOB, uses inhalers as needed-Oxygen Sat 97% today Clinical Goal(s):  patient will work with RN Case Engineer, civil (consulting) and provider towards tobacco cessation  Interventions: Collaboration with [name], NP regarding development and update of comprehensive plan of care as evidenced by provider attestation and co-signature Inter-disciplinary care team collaboration (see longitudinal plan of care) Evaluation of current treatment plan reviewed. 12-02-2020: The patient is under a lot of stress and says smoking is what she does to  help with her stress relief. Update 12/26/20:  Patient smoking 4-5 cigarettes a day, trying to quit, discussed contacting PCP.  Update 01/22/21:  Patient states she is smoking a pack every 2 weeks, has tried gum. Update 02/21/21:  No change. Provided contact information for [place] [facility] (1-800-QUIT-NOW). Patient will outreach this group for support. Update 01/22/21:  Patient states she has utilized [employer] for resources. Discussed plans with patient for ongoing care management follow up and provided patient with direct contact information for care management team Provided patient with printed smoking cessation educational materials Provided contact information for [place] [facility] (1-800-QUIT-NOW). Patient will outreach this group for support. Evaluation of current treatment plan reviewed-12-02-2020:  currently the patient is cutting back but wants to quit. Patient knows she can not smoke and use oxygen.  Patient Goals/Self-Care Activities patient will:  - mindfulness, talking to family and friends as a habit during cravings  - verbally commit to reducing tobacco consumption - barriers to  lifestyle changes reviewed and addressed - barriers to treatment reviewed and addressed - breathing techniques encouraged - modification of home and work environment promoted - rescue (action) plan reviewed - signs/symptoms of infection reviewed - signs/symptoms of worsening disease assessed - treatment plan reviewed Follow Up Plan: Telephone follow up appointment with care management team member scheduled. The patient is transitioning to the [facility] team and will be followed by new RNCM. The patient accepts transfer today. 12-02-2020 Update 02/21/21:  patient to see PCP 03/04/21.    Care Plan : RNCM: Hypertension (Adult)  Updates made by [name], RN since 02/21/2021 12:00 AM     Problem: RNCM: Hypertension (Hypertension)   Priority: Medium  Onset Date: 05/22/2020     Long-Range Goal: RNCM: Hypertension Monitored   Start Date: 05/22/2020  Expected End Date: 03/27/2022  Recent Progress: On track  Priority: Medium  Note:   Objective:  Last practice recorded BP readings:  BP Readings from Last 3 Encounters:  11/20/20 (!) 154/68  11/20/20 113/60  10/30/20 126/67    Most recent eGFR/CrCl: No results found for: EGFR  No components found for: CRCL Current Barriers:  Knowledge Deficits related to basic understanding of hypertension pathophysiology and self care management Knowledge Deficits related to understanding of medications prescribed for management of hypertension Limited Social Support Unable to independently manage HTN Lacks social connections Does not contact provider office for questions/concerns Case Manager Clinical Goal(s):   patient will verbalize understanding of plan for hypertension management patient will demonstrate improved adherence to prescribed treatment plan for hypertension as evidenced by taking all medications as prescribed, monitoring and recording blood pressure as directed, adhering to low sodium/DASH diet patient will demonstrate improved health  management independence as evidenced by checking blood pressure as directed and notifying PCP if SBP>160 or DBP > 90, taking all medications as prescribe, and adhering to a low sodium diet as discussed. Interventions:  Collaboration with [name], NP regarding development and update of comprehensive plan of care as evidenced by provider attestation and co-signature Inter-disciplinary care team collaboration (see longitudinal plan of care) Evaluation of current treatment plan related to hypertension self management and patient's adherence to plan as established by provider. 12-02-2020: The patient has been very stressed out and states that she feels her health is out of control. She says she was supposed to go have an ECHO but she has not been able to do so because she was in the ER recently and has an infection on the bottom of her right foot. She states she is still taking abx for the infection. Has a follow up with pcp on 12-04-2020. The patient advised to seek emergent help for changes in her condition.  Update 12/26/20:  patient checking blood pressure daily-WNL today. Update 01/22/21:  Patient states that she checks her blood pressure 2 times a day, last reading 165/60-encouraged patient to follow up with provider. Update 02/21/21:  patient has appt with PCP 03/04/21 for follow up-diastolic blood pressure in 90s per patient. Provided education to patient re: stroke prevention, s/s of heart attack and stroke, DASH diet, complications of uncontrolled blood pressure Reviewed medications with patient and discussed importance of compliance. 12-02-2020: States compliance in medications. Is worried about getting what she needs to care for herself.  Discussed plans with patient for ongoing care management follow up and provided patient with direct contact information for care management team Advised patient, providing education and rationale, to monitor blood pressure daily and record, calling PCP for  findings outside established parameters.  Patient Goals/Self-Care Activities Over the next 120 days, patient will:  - Self administers medications as prescribed Attends all scheduled provider appointments Calls provider office for new concerns, questions, or BP outside discussed parameters Checks BP and records as discussed Follows a low sodium diet/DASH diet - blood pressure trends reviewed - depression screen reviewed - home or ambulatory blood pressure monitoring encouraged Follow Up Plan: Telephone follow up appointment with care management team member scheduled for:  Transitioning to [facility] team. Will follow up with them at scheduled time.     Care Plan : RNCM: Depression (Adult)  Updates made by [name], RN since 02/21/2021 12:00 AM     Problem: RNCM: Symptoms (Depression)   Priority: High  Onset Date: 12/02/2020     Long-Range Goal: RNCM: Symptoms Monitored and Managed   Start Date: 12/02/2020  Expected End Date: 03/27/2022  Recent Progress: Not on track  Priority: High  Note:   Current Barriers:  Ineffective Self Health Maintenance in a patient  with Anxiety and Depression Unable to independently manage depression and anxiety as evidence of multiple chronic conditions and the patient feeling her health is out of control  Does not attend all scheduled provider appointments Lacks social connections Unable to perform IADLs independently Does not contact provider office for questions/concerns Clinical Goal(s):  Collaboration with [name], NP regarding development and update of comprehensive plan of care as evidenced by provider attestation and co-signature Inter-disciplinary care team collaboration (see longitudinal plan of care) patient will work with care management team to address care coordination and chronic disease management needs related to Disease Management Educational Needs Care Coordination Mental Health Counseling Level of Care Concerns    Interventions:  Evaluation of current treatment plan related to Anxiety and Depression, Financial constraints related to insurance not approving care for the patient to go to wound care , Limited social support, Level of care concerns, ADL IADL limitations, Mental Health Concerns , Family and relationship dysfunction, Substance abuse issues -  smoker , and Inability to perform IADL's independently self-management and patient's adherence to plan as established by provider. Collaboration with [name], NP regarding development and update of comprehensive plan of care as evidenced by provider attestation       and co-signature Inter-disciplinary care team collaboration (see longitudinal plan of care) Discussed plans with patient for ongoing care management follow up and provided patient with direct contact information for care management team Evaluation of depression and anxiety. The patient is having a hard time and just has not felt well recently. Has an infection in her right foot and was in the ER on 11-20-2020. The patient states that she is taking antibiotics but feels like things are not going well with her over all health and well being. Denies SI. The patient talked about family dysfunction and being stressed over her family and things they are doing and saying to her. Has support from her husband but he has his own health conditions. Empathetic listening and support given. Will benefit from continued CCM services throught the [facility] care management team. Disussed transition to the [facility] team today and the patient agress to transition.  Update 11/25/20:  RNCM provided active listening-patient has follow up appointment with LCSW 01/01/21. Update 02/21/21:  LCSW following. Self Care Activities:  Patient verbalizes understanding of plan to effectively manage depression and anxiety Self administers medications as prescribed Attends all scheduled provider appointments Calls pharmacy for medication  refills Attends church or other social activities Performs ADL's independently Performs IADL's independently Calls provider office for new concerns or questions Work with the [facility] team to meet needs for continued CCM services  Patient Goals: - activity or exercise based on tolerance encouraged - depression screen reviewed - emotional support provided - healthy lifestyle promoted - medication side effects monitored and managed - pain managed - participation in mental health treatment encouraged - quality of sleep assessed - response to mental health treatment monitored - response to pharmacologic therapy monitored - sleep hygiene techniques encouraged - social activities and relationships encouraged - substance use assessed Follow Up Plan: The care management team will reach out to the patient again over the next 30 to 60 days.  The patient transitioning over to the [facility] team     Follow Up:  Patient agrees to Care Plan and Follow-up.  Plan: The Managed Medicaid care management team will reach out to the patient again over the next 30 days. and The  Patient has been provided with contact information for the Managed Medicaid  care management team and has been advised to call with any health related questions or concerns.  Date/time of next scheduled RN care management/care coordination outreach:  03/24/21 at 1030.

## 2021-02-21 NOTE — Patient Instructions (Signed)
Visit Information  Ms. Labo was given information about Medicaid Managed Care team care coordination services as a part of their [facility] Medicaid benefit. Sereena Charlise Giovanetti verbally consented to engagement with the [facility] Managed Care team.   If you are experiencing a medical emergency, please call 911 or report to your local emergency department or urgent care.   If you have a non-emergency medical problem during routine business hours, please contact your provider's office and ask to speak with a nurse.   For questions related to your [facility] health plan, please call: [phone] or go here:[email or web address]  If you would like to schedule transportation through your [facility] plan, please call the following number at least 2 days in advance of your appointment: [phone].  Call the [facility] at [phone], at any time, 24 hours a day, 7 days a week. If you are in danger or need immediate medical attention call 911.  If you would like help to quit smoking, call 1-800-QUIT-NOW 805-150-1748) OR Espaol: 1-855-Djelo-Ya (2-751-700-1749) o para ms informacin haga clic aqu or Text READY to 200-400 to register via text  Ms. Clyatt - following are the goals we discussed in your visit today:   Goals Addressed             This Visit's Progress    RNCM: Keep Skin Clean and Dry       Follow Up Date: 30 to 60 days  - clean and dry skin well - keep skin dry - use a fragrance-free lotion on skin - wear a protective pad or garment    Why is this important?   Leaking urine (pee) can cause soreness from skin rashes and redness.  This is caused by skin being exposed to urine (pee).    12-02-2020: The patient states she currently has a wound on the bottom of her right foot. She was seen in the ER on 11-20-2020. The patient is currently taking antibiotics. She has a follow up with the pcp on 12-04-2020. Advised the patient to  keep appointment with pcp and got back to the ER for fever, purulent drainage, odor, or changes in wound. The patient verbalized understanding.   (/1/22:  patient has appointment with Dr. Posey Pronto 01/07/21 for follow up-still taking antibiotics. Update 01/22/21:  Patient states right foot wound is healing-has follow up appointment scheduled. Update 02/21/21:  Patient just finished antibiotic for foot-to follow up with provider.     RNCM: Track and Manage My Symptoms-Depression       Timeframe:  Long-Range Goal Priority:  High Start Date:    12-02-2020                         Expected End Date:   ongoing                Follow Up Date: 03/24/21   - avoid negative self-talk - develop a personal safety plan - develop a plan to deal with triggers like holidays, anniversaries - exercise at least 2 to 3 times per week - have a plan for how to handle bad days - journal feelings and what helps to feel better or worse - spend time or talk with others at least 2 to 3 times per week - spend time or talk with others every day - watch for early signs of feeling worse    Why is this important?   Keeping track of your progress will help your treatment  team find the right mix of medicine and therapy for you.  Write in your journal every day.  Day-to-day changes in depression symptoms are normal. It may be more helpful to check your progress at the end of each week instead of every day.     Notes: 12-02-2020: The patient states she is having a difficult time because of all the health conditions she is faced with right now. She has a wound on her right foot and is currently taking abx.  She hasn't felt well. She comes back to see the pcp on 12-04-2020. Education on working with the CCM team to help manage health and well being.  Update 12/26/20:  patient has appointment with LCSW 01/01/21 Update 01/22/21:  LCSW continues to follow. Update10/28/22:  LCSW appt to be rescheduled-patient unavailable at last appt.      RNCM: Track and Manage My Triggers-COPD       Timeframe:  Long-Range Goal Priority:  Medium Start Date:     05-22-2020                        Expected End Date:         05-22-2021              Follow Up Date 30 to 60 days   - avoid second hand smoke - eliminate smoking in my home - identify and avoid work-related triggers - identify and remove indoor air pollutants - limit outdoor activity during cold weather - listen for public air quality announcements every day    Why is this important?   Triggers are activities or things, like tobacco smoke or cold weather, that make your COPD (chronic obstructive pulmonary disease) flare-up.  Knowing these triggers helps you plan how to stay away from them.  When you cannot remove them, you can learn how to manage them.     12-02-2020: The patient states the ER doctor told her that she needs oxygen. The patient states that she has been taking her oxygen levels and they have been in the 80's. Recommended the patient to write down values and bring to the next pcp visit.  Update 12/26/20:  patient continues to check oxygen saturations-WNL so far. Update 01/22/21:  No complaints today. Update 02/21/21:  Patient has SOB, uses inhalers as needed, ? Need for Oxygen-to follow up with provider, Sat 97% today.        The patient verbalized understanding of instructions provided today and declined a print copy of patient instruction materials.   The Managed Medicaid care management team will reach out to the patient again over the next 30 days.  The  Patient has been provided with contact information for the Managed Medicaid care management team and has been advised to call with any health related questions or concerns.   Aida Raider RN, BSN [place]  [facility] [employer] - Managed Medicaid High Risk [phone].    Following is a copy of your plan of care:   Patient Care Plan: RNCM: COPD (Adult)     Problem  Identified: RNCM: Psychological Adjustment to Diagnosis (COPD)   Priority: Medium  Onset Date: 05/22/2020     Long-Range Goal: RNCM: Adjustment to Disease Achieved   Start Date: 05/22/2020  Expected End Date: 03/27/2022  Recent Progress: On track  Priority: High  Note:   Current Barriers:  Knowledge deficits related to basic understanding of COPD disease process Knowledge deficits related to basic COPD  self care/management Knowledge deficit related to basic understanding of how to use inhalers and how inhaled medications work Knowledge deficit related to importance of energy conservation Limited Social Support Unable to independently manage COPD Lacks social connections Does not contact provider office for questions/concerns  Case Manager Clinical Goal(s): patient will report using inhalers as prescribed including rinsing mouth after use patient will be able to verbalize understanding of COPD action plan and when to seek appropriate levels of medical care  patient will engage in lite exercise as tolerated to build/regain stamina and strength and reduce shortness of breath through activity tolerance  patient will verbalize basic understanding of COPD disease process and self care activities  Interventions:  Collaboration with [name], NP regarding development and update of comprehensive plan of care as evidenced by provider attestation and co-signature Inter-disciplinary care team collaboration (see longitudinal plan of care) UNABLE to independently: manage COPD Provided patient with basic written and verbal COPD education on self care/management/and exacerbation prevention. 12-02-2020: The patient states she is not feeling well. Was in the ER recently for an infection in her right foot. The patient states that she needs to go to the wound clinic but her insurance will not pay for it. Has a follow up with the pcp on 12-04-2020. Advised the patient to discuss with the pcp her options  and also talk about her change in her breathing. The patient has been taking her pulse ox readings and states that her levels have been low and in the 80's. The patient denies any acute breathing issues today but feels her health is not in the best place right now.  Update 12/26/20:  Patient with no breathing complaint today-checks oxygen saturations as needed. Update 01/22/21:  no complaints today. Update 02/21/21:  patient has SOB and uses inhaler as needed-Oxygen Sat to day 97% Provided patient with COPD action plan and reinforced importance of daily self assessment. 12-02-2020: Education and support given.  Provided written and verbal instructions on pursed lip breathing and utilized returned demonstration as teach back Provided instruction about proper use of medications used for management of COPD including inhalers Advised patient to self assesses COPD action plan zone and make appointment with provider if in the yellow zone for 48 hours without improvement. Provided patient with education about the role of exercise in the management of COPD Advised patient to engage in light exercise as tolerated 3-5 days a week Provided education about and advised patient to utilize infection prevention strategies to reduce risk of respiratory infection. 12-02-2020: The patient denies any pulmonary infections, is currently dealing with a foot infections. Is on abx and will follow up with the pcp on 12-04-2020.  Patient Goals/Self-Care Activities:  - decision-making supported - depression screen reviewed - emotional support provided - family involvement promoted - problem-solving facilitated - relaxation techniques promoted - verbalization of feelings encouraged Follow Up Plan: Telephone follow up appointment with care management team member scheduled for: 12-02-2020: The patient transitioning to the [facility] team and will be followed by the CCM team for [facility]. The patient knows to expect a call from the scheduler to  schedule with [facility] Update 02/21/21:  patient has follow up appt with PCP 03/04/21.     Problem Identified: RNCM: Symptom Exacerbation (COPD)   Priority: High  Onset Date: 05/22/2020     Long-Range Goal: RNCM: Smoking Cessation Symptom Exacerbation Prevented or Minimized   Start Date: 05/22/2020  Expected End Date: 03/27/2022  Recent Progress: Not on track  Priority: High  Note:   Current Barriers:  Unable to independently stop smoking  Does not adhere to provider recommendations re: smoking cessation  Lacks social connections Does not contact provider office for questions/concerns Tobacco abuse of >40 years; currently smoking 1 pack every 2 weeks Previous quit attempts, unsuccessful several successful using patches, gum- has a pattern of stopping and then starting back  Reports smoking within 30 minutes of waking up Reports triggers to smoke include: stress, chronic conditions  Reports motivation to quit smoking includes: knows it will help her breathing and other chronic conditions  On a scale of 1-10, reports MOTIVATION to quit is 8 On a scale of 1-10, reports CONFIDENCE in quitting is 7 Update 02/21/21:  patient with SOB, uses inhalers as needed-Oxygen Sat 97% today Clinical Goal(s):  patient will work with RN Case Engineer, civil (consulting) and provider towards tobacco cessation  Interventions: Collaboration with [name], NP regarding development and update of comprehensive plan of care as evidenced by provider attestation and co-signature Inter-disciplinary care team collaboration (see longitudinal plan of care) Evaluation of current treatment plan reviewed. 12-02-2020: The patient is under a lot of stress and says smoking is what she does to  help with her stress relief. Update 12/26/20:  Patient smoking 4-5 cigarettes a day, trying to quit, discussed contacting PCP.  Update 01/22/21:  Patient states she is smoking a pack every 2 weeks, has tried gum. Update  02/21/21:  No change. Provided contact information for [place] [facility] (1-800-QUIT-NOW). Patient will outreach this group for support. Update 01/22/21:  Patient states she has utilized [employer] for resources. Discussed plans with patient for ongoing care management follow up and provided patient with direct contact information for care management team Provided patient with printed smoking cessation educational materials Provided contact information for [place] [facility] (1-800-QUIT-NOW). Patient will outreach this group for support. Evaluation of current treatment plan reviewed-12-02-2020:  currently the patient is cutting back but wants to quit. Patient knows she can not smoke and use oxygen.  Patient Goals/Self-Care Activities patient will:  - mindfulness, talking to family and friends as a habit during cravings  - verbally commit to reducing tobacco consumption - barriers to lifestyle changes reviewed and addressed - barriers to treatment reviewed and addressed - breathing techniques encouraged - modification of home and work environment promoted - rescue (action) plan reviewed - signs/symptoms of infection reviewed - signs/symptoms of worsening disease assessed - treatment plan reviewed Follow Up Plan: Telephone follow up appointment with care management team member scheduled. The patient is transitioning to the [facility] team and will be followed by new RNCM. The patient accepts transfer today. 12-02-2020 Update 02/21/21:  patient to see PCP 03/04/21.    Patient Care Plan: RNCM: Hypertension (Adult)     Problem Identified: RNCM: Hypertension (Hypertension)   Priority: Medium  Onset Date: 05/22/2020     Long-Range Goal: RNCM: Hypertension Monitored   Start Date: 05/22/2020  Expected End Date: 03/27/2022  Recent Progress: On track  Priority: Medium  Note:   Objective:  Last practice recorded BP readings:  BP Readings from Last 3 Encounters:  11/20/20 (!) 154/68  11/20/20 113/60  10/30/20  126/67    Most recent eGFR/CrCl: No results found for: EGFR  No components found for: CRCL Current Barriers:  Knowledge Deficits related to basic understanding of hypertension pathophysiology and self care management Knowledge Deficits related to understanding of medications prescribed for management of hypertension Limited Social Support Unable to independently manage HTN Lacks  social connections Does not contact provider office for questions/concerns Case Manager Clinical Goal(s):   patient will verbalize understanding of plan for hypertension management patient will demonstrate improved adherence to prescribed treatment plan for hypertension as evidenced by taking all medications as prescribed, monitoring and recording blood pressure as directed, adhering to low sodium/DASH diet patient will demonstrate improved health management independence as evidenced by checking blood pressure as directed and notifying PCP if SBP>160 or DBP > 90, taking all medications as prescribe, and adhering to a low sodium diet as discussed. Interventions:  Collaboration with [name], NP regarding development and update of comprehensive plan of care as evidenced by provider attestation and co-signature Inter-disciplinary care team collaboration (see longitudinal plan of care) Evaluation of current treatment plan related to hypertension self management and patient's adherence to plan as established by provider. 12-02-2020: The patient has been very stressed out and states that she feels her health is out of control. She says she was supposed to go have an ECHO but she has not been able to do so because she was in the ER recently and has an infection on the bottom of her right foot. She states she is still taking abx for the infection. Has a follow up with pcp on 12-04-2020. The patient advised to seek emergent help for changes in her condition.  Update 12/26/20:  patient checking blood pressure daily-WNL  today. Update 01/22/21:  Patient states that she checks her blood pressure 2 times a day, last reading 165/60-encouraged patient to follow up with provider. Update 02/21/21:  patient has appt with PCP 03/04/21 for follow up-diastolic blood pressure in 90s per patient. Provided education to patient re: stroke prevention, s/s of heart attack and stroke, DASH diet, complications of uncontrolled blood pressure Reviewed medications with patient and discussed importance of compliance. 12-02-2020: States compliance in medications. Is worried about getting what she needs to care for herself.  Discussed plans with patient for ongoing care management follow up and provided patient with direct contact information for care management team Advised patient, providing education and rationale, to monitor blood pressure daily and record, calling PCP for findings outside established parameters.  Patient Goals/Self-Care Activities Over the next 120 days, patient will:  - Self administers medications as prescribed Attends all scheduled provider appointments Calls provider office for new concerns, questions, or BP outside discussed parameters Checks BP and records as discussed Follows a low sodium diet/DASH diet - blood pressure trends reviewed - depression screen reviewed - home or ambulatory blood pressure monitoring encouraged Follow Up Plan: Telephone follow up appointment with care management team member scheduled for:  Transitioning to [facility] team. Will follow up with them at scheduled time.      Patient Care Plan: RNCM: Depression (Adult)     Problem Identified: RNCM: Symptoms (Depression)   Priority: High  Onset Date: 12/02/2020     Long-Range Goal: RNCM: Symptoms Monitored and Managed   Start Date: 12/02/2020  Expected End Date: 03/27/2022  Recent Progress: Not on track  Priority: High  Note:   Current Barriers:  Ineffective Self Health Maintenance in a patient with Anxiety and Depression Unable to  independently manage depression and anxiety as evidence of multiple chronic conditions and the patient feeling her health is out of control  Does not attend all scheduled provider appointments Lacks social connections Unable to perform IADLs independently Does not contact provider office for questions/concerns Clinical Goal(s):  Collaboration with [name], NP regarding development and update of comprehensive plan of  care as evidenced by provider attestation and co-signature Inter-disciplinary care team collaboration (see longitudinal plan of care) patient will work with care management team to address care coordination and chronic disease management needs related to Disease Management Educational Needs Care Coordination Mental Health Counseling Level of Care Concerns   Interventions:  Evaluation of current treatment plan related to Anxiety and Depression, Financial constraints related to insurance not approving care for the patient to go to wound care , Limited social support, Level of care concerns, ADL IADL limitations, Mental Health Concerns , Family and relationship dysfunction, Substance abuse issues -  smoker , and Inability to perform IADL's independently self-management and patient's adherence to plan as established by provider. Collaboration with [name], NP regarding development and update of comprehensive plan of care as evidenced by provider attestation       and co-signature Inter-disciplinary care team collaboration (see longitudinal plan of care) Discussed plans with patient for ongoing care management follow up and provided patient with direct contact information for care management team Evaluation of depression and anxiety. The patient is having a hard time and just has not felt well recently. Has an infection in her right foot and was in the ER on 11-20-2020. The patient states that she is taking antibiotics but feels like things are not going well with her  over all health and well being. Denies SI. The patient talked about family dysfunction and being stressed over her family and things they are doing and saying to her. Has support from her husband but he has his own health conditions. Empathetic listening and support given. Will benefit from continued CCM services throught the [facility] care management team. Disussed transition to the [facility] team today and the patient agress to transition.  Update 11/25/20:  RNCM provided active listening-patient has follow up appointment with LCSW 01/01/21. Update 02/21/21:  LCSW following. Self Care Activities:  Patient verbalizes understanding of plan to effectively manage depression and anxiety Self administers medications as prescribed Attends all scheduled provider appointments Calls pharmacy for medication refills Attends church or other social activities Performs ADL's independently Performs IADL's independently Calls provider office for new concerns or questions Work with the [facility] team to meet needs for continued CCM services  Patient Goals: - activity or exercise based on tolerance encouraged - depression screen reviewed - emotional support provided - healthy lifestyle promoted - medication side effects monitored and managed - pain managed - participation in mental health treatment encouraged - quality of sleep assessed - response to mental health treatment monitored - response to pharmacologic therapy monitored - sleep hygiene techniques encouraged - social activities and relationships encouraged - substance use assessed Follow Up Plan: The care management team will reach out to the patient again over the next 30 to 60 days.  The patient transitioning over to the [facility] team

## 2021-02-25 ENCOUNTER — Other Ambulatory Visit: Payer: Self-pay

## 2021-02-25 DIAGNOSIS — Z419 Encounter for procedure for purposes other than remedying health state, unspecified: Secondary | ICD-10-CM | POA: Diagnosis not present

## 2021-02-26 NOTE — Patient Outreach (Signed)
Medicaid Managed Care Social Work Note  02/26/2021 Name:  Cheryl Chandler MRN:  [id number] DOB:  1965/08/18  Cheryl Chandler is an 55 y.o. year old female who is a primary patient of Cheryl Billings, NP.  The Medicaid Managed Care Coordination team was consulted for assistance with:  [place] and Resources  [name] was given information about Medicaid Managed Care Coordination team services today. [place] Patient agreed to services and verbal consent obtained.  Engaged with patient  for by telephone forfollow up visit in response to referral for case management and/or care coordination services.   Assessments/Interventions:  Review of past medical history, allergies, medications, health status, including review of consultants reports, laboratory and other test data, was performed as part of comprehensive evaluation and provision of chronic care management services.  SDOH: (Social Determinant of Health) assessments and interventions performed: [name]    Flowsheet Row Most Recent Value  [name]   Stress Interventions Provide Counseling, Offered [employer] Resources       Advanced Directives Status:  See Care Plan for related entries.  Care Plan                 Allergies  Allergen Reactions   Codeine Shortness Of Breath and Rash   Percocet [Oxycodone-Acetaminophen] Shortness Of Breath   Sulfa Antibiotics Shortness Of Breath and Rash   Aspirin Hives   Bee Venom     Bee stings   Darvon [Propoxyphene]     Darvocet-N 100   Latex    Oxycodone Nausea And Vomiting   Penicillins     Has patient had a PCN reaction causing immediate rash, facial/tongue/throat swelling, SOB or lightheadedness with hypotension: Unknown Has patient had a PCN reaction causing severe rash involving mucus membranes or skin necrosis: Unknown Has patient had a PCN reaction that required hospitalization: Unknown Has patient had a PCN  reaction occurring within the last 10 years: Unknown If all of the above answers are "NO", then may proceed with Cephalosporin use.    Medications Reviewed Today     Reviewed by [name], RN (Registered Nurse) on 02/21/21 at 35  Med List Status: <None>   Medication Order Taking? Sig Documenting Provider Last Dose Status Informant  ACCU-CHEK GUIDE test strip [id number] No  [provider] Taking Active   Accu-Chek Softclix Lancets lancets [id number] No CHECK BLOOD SUGAR TWICE DAILY Cheryl Billings, NP Taking Active   albuterol (PROVENTIL) (2.5 MG/3ML) 0.083% nebulizer solution [id number] No INHALE CONTENTS OF 1 VIAL IN NEBULIZER EVERY 4 HOURS AS NEEDED Cheryl Billings, NP Taking Active   amLODipine (NORVASC) 5 MG tablet [id number]  Take 2 tablets (10 mg total) by mouth daily. Johnson, [name], DO  Active   atorvastatin (LIPITOR) 80 MG tablet [id number] No Take 1 tablet (80 mg total) by mouth daily. Cheryl Billings, NP Taking Active   Blood Glucose Monitoring Suppl (ACCU-CHEK GUIDE) w/Device KIT [id number] No Use to check blood sugar 3 times a day. [name], NP Taking Active   carvedilol (COREG) 3.125 MG tablet [id number] No Take 1 tablet (3.125 mg total) by mouth 2 (two) times daily with a meal. [name], DO Taking Active   citalopram (CELEXA) 20 MG tablet [id number] No TAKE 1 TABLET BY MOUTH ONCE DAILY Cheryl Billings, NP Taking Active   EPINEPHrine 0.3 mg/0.3 mL IJ SOAJ injection [id number] No USE AS DIRECTED. Cheryl Billings, NP Taking Active   furosemide (LASIX) 20 MG tablet [id number]  TAKE 1 TABLET BY MOUTH ONCE DAILY IN AFTERNOON AS NEEDED LEG EDEMA Cheryl Billings, NP  Active   furosemide (LASIX) 40 MG tablet [id number] No Take 1 tablet (40 mg total) by mouth daily. [name], PA-C Taking Active            Med Note [name] Oct 14, 2020  1:41 PM) Told to take in AM, not afternoon  gabapentin (NEURONTIN) 300 MG capsule  [id number]  TAKE 1 CAPSULE BY MOUTH 3 TIMES DAILY ASNEEDED (NEED OFFICE VISIT FOR FURTHER REFILLS) Cheryl Billings, NP  Active   levothyroxine (SYNTHROID) 50 MCG tablet [id number] No TAKE 1 TABLET BY MOUTH ONCE DAILY ON AN EMPTY STOMACH. WAIT 30 MINUTES BEFORE TAKING OTHER MEDS. Cheryl Billings, NP Taking Active   lisinopril (ZESTRIL) 40 MG tablet [id number] No TAKE 1 TABLET BY MOUTH ONCE DAILY Cheryl Billings, NP Taking Active   meloxicam (MOBIC) 15 MG tablet [id number]  TAKE 1 TABLET BY MOUTH ONCE DAILY AS NEEDED WITH FOOD OR MILK Cheryl Billings, NP  Active   methocarbamol (ROBAXIN) 750 MG tablet [id number] No TAKE 1-2 TABLETS BY MOUTH 3 TIMES DAILY AS NEEDED [name], [name], NP Taking Active   mometasone-formoterol (DULERA) 100-5 MCG/ACT AERO [id number] No INHALE 2 PUFFS BY MOUTH TWICE A DAY. RINSE MOUTH AFTER USE. [name], PA-C Taking Active   montelukast (SINGULAIR) 10 MG tablet [id number] No Take 1 tablet (10 mg total) by mouth at bedtime. Cheryl Billings, NP Taking Active   nitroGLYCERIN (NITROSTAT) 0.4 MG SL tablet [id number] No DISSOLVE 1 TABLET UNDER TONGUE AT ONSET OF CHEST PAIN. REPEAT IN 5 MIN IF NOT RESOLVED, MAX 3 DOSES. 911 IF NEEDED. Cheryl Billings, NP Taking Active   omeprazole (PRILOSEC) 20 MG capsule [id number] No Take 1 capsule (20 mg total) by mouth daily. Cheryl Billings, NP Taking Active   QUEtiapine (SEROQUEL) 25 MG tablet [id number] No TAKE 1 TABLET BY MOUTH AT BEDTIME Cheryl Billings, NP Taking Active   SPIRIVA HANDIHALER 18 MCG inhalation capsule [id number]  INHALE CONTENTS OF 1 CAPSULE ONCE DAILY Cheryl Billings, NP  Active   triamcinolone cream (KENALOG) 0.1 % [id number] No APPLY 1 APPLICATION TOPICALLY TWICE DAILY [name], NP Taking Active   TRULICITY 7.01 XB/9.3JQ SOPN [id number] No INJECT 0.75MG SUBQ ONCE A [name], NP Taking Active   Vitamin D, Ergocalciferol, (DRISDOL) 1.25 MG (50000 UNIT) CAPS capsule [id number] No TAKE  1 CAPSULE BY MOUTH ONCE EVERY 7 DAYS Cheryl Billings, NP Taking Active             Patient Active Problem List   Diagnosis Date Noted   Fibrocystic disease of both breasts 05/07/2019   IFG (impaired fasting glucose) 05/03/2019   Chronic venous insufficiency 11/09/2018   Overactive bladder 09/30/2018   Peripheral neuropathy 09/30/2018   Apnea 01/02/2016   Lymphedema 11/30/2014   Dyspnea on exertion 11/30/2014   Anxiety 11/29/2014   Arthritis 11/29/2014   COPD (chronic obstructive pulmonary disease) ([place]) 11/29/2014   Clinical depression 11/29/2014   H/O eating disorder 11/29/2014   Esophageal reflux 11/29/2014   Acne inversa 11/29/2014   Essential hypertension 11/29/2014   HLD (hyperlipidemia) 11/29/2014   Adult hypothyroidism 11/29/2014   Insomnia 11/29/2014   Low back pain 11/29/2014   Morbid obesity ([place]) 11/29/2014   Posttraumatic stress disorder 11/29/2014   Vitamin D deficiency 11/29/2014   Nicotine dependence, cigarettes, uncomplicated 45/62/5638    Conditions to be addressed/monitored per PCP order:  Depression  Care  Plan : General Social Work (Adult)  Updates made by [name], LCSW since 02/26/2021 12:00 AM     Problem: Coping Skills (General Plan of Care)      Long-Range Goal: Coping Skills Enhanced   Start Date: 11/07/2020  Recent Progress: On track  Priority: High  Note:   Timeframe:  Long-Range Goal Priority:  High Start Date:       11/07/20                      Expected End Date:  ongoing                Follow Up Date - 04/02/21  Current Barriers:  Financial constraints Mental Health Concerns  Social Isolation Limited education about mental health support resources that are available to her within the local area* Cognitive Deficits Lacks knowledge of community resource  Clinical Social Work Clinical Goal(s):  Over the next 90 days, client will work with SW to address concerns related to experiencing ongoing symptoms of depression,  sadness and grief since the passing of her mother Over the next 90 days, patient will work with LCSW to address needs related to implementing appropriate self-care and depression management.   Interventions: Patient interviewed and appropriate assessments performed Provided mental health counseling with regards to patient's loss and ongoing stressors. Patient was very close with her mother who passed away a few years ago and she continues to experience symptoms of grief from this loss. Patient denies wanting LCSW to make a referral for grief counseling through [facility] again but was receptive to coping skill and resource education/information that was provided during session today. Patient reports decorating for the holidays helps her to connect with her mother as they did this together when she was alive.  Provided patient with information about managing depression within her daily life. Patient has ongoing grief symptoms as well. Patient states "I feel worn down." Patient reports that she talks out loud to her mother's picture and this is very helpful to her. Grief support resource education provided again during session today.  Provided reflective listening and implemented appropriate interventions to help suppport patient and her emotional needs  Discussed plans with patient for ongoing care management follow up and provided patient with direct contact information for [facility] team Advised patient to contact [facility] team with any urgent case management needs. Assisted patient/caregiver with obtaining information about health plan benefits LCSW completed past referral for family to have a wheelchair ramp built at their residence due to ongoing mobility concerns. Ramp has been successfully installed.  PCS actively involved with patient and providing ongoing services.  Positive reinforcement provided to patient for quitting smoking cigarettes. Coping skill review education provided for future triggers.   Brief self-care education provided to both patient and spouse. Patient confirms that she continues to go to food pantry for food insecurity. Resource information has been provided.  Patient reports ongoing pain and issues with mobility. Patient shares that her pain resides in her pains and legs. Emotional support and coping skill education provided. Patient confirms that her PCS aide continues to provide her with services. However, aide has to be reminded to put on her mask since patient is high risk. LCSW reviewed resources that [name] provided her with in the past.  Patient reports that she had a wonderful birthday spent with her family this past May. Patient reports receiving appropriate socialization. Patient reports that her spouse took her out to eat and gifted  her with several things which was a nice surprise.  Patient confirms stable transportation arrangements for upcoming PCP appointment this week.  Patient repots that she is losing a lot of weight but is not sure how much she has lost as she does not have a scale. Patient reports that a goal of hers is to lose weight but she has not been intentionally losing weight at this time.  Patient reports that her aide [name] continues to assist her with PCS and is an excellent support within the home for her and spouse.  Patient planted a tomato plant and successfully grew over 5 tomatoes over the summer. Patient is very proud of this achievement as this was her first time planting a tomato plant and is now interested in picking up gardening/planting as a hobby. Patient reports that her aunt continues to be a positive support for her in her life and someone that she can rely on in the case of emergencies.  Patient was educated on healthy self-care skills and advised her to use these into her daily routine.  Patient reports that she has completed two COVID vaccines and will soon get her booster.  [facility] LCSW completed care coordination with Thomasene Lot on 01/15/21 and [facility] LCSW provided the following suggested resource-It is likely their best option given their loss of housing and lack of consistent income:  Publishing rights manager                                                                                                                                                                              (Emergency [place]) [phone] Ext. 104 M-F, 8am - 4pm Patient was successfully approved for food stamps but is still experiencing financial concerns. She reports that she and her husband have no clothes that fit them and they are having to start using a string for their pants and patient only has one bra at this time. [facility] LCSW will send referral to [facility] BSW today for financial support.  BSW spoke with patient about resources needed. Patient states she does receive SSI and was approved for foodstamps. Patient is familiar with all of the food pantries and other resources in [place]. BSW will assist patient with finding an eye doctor. Patient states she has not been in 10 years. BSW will research and put a list of eye doctors together that accept Medicaid in [facility]. Update- Patient reports experiencing additional grief lately. [facility] LCSW offered referral for berievement counseling but she declined. Managing Loss, Adult People experience loss in many different ways throughout their lives. Events such as moving, changing jobs, and losing friends can create a sense of loss. The loss may be  as serious as a major health change, divorce, death of a pet, or death of a loved one. All of these types of loss are likely to create a physical and emotional reaction known as grief. Grief is the result of a major change or an absence of something or someone that you count on. Grief is a normal reaction to loss. A variety of factors can affect your grieving experience, including: The nature of your loss. Your relationship to what or  whom you lost. Your understanding of grief and how to manage it. Your support system. How to manage lifestyle changes Keep to your normal routine as much as possible. If you have trouble focusing or doing normal activities, it is acceptable to take some time away from your normal routine. Spend time with friends and loved ones. Eat a healthy diet, get plenty of sleep, and rest when you feel tired. How to recognize changes  The way that you deal with your grief will affect your ability to function as you normally do. When grieving, you may experience these changes: Numbness, shock, sadness, anxiety, anger, denial, and guilt. Thoughts about death. Unexpected crying. A physical sensation of emptiness in your stomach. Problems sleeping and eating. Tiredness (fatigue). Loss of interest in normal activities. Dreaming about or imagining seeing the person who died. A need to remember what or whom you lost. Difficulty thinking about anything other than your loss for a period of time. Relief. If you have been expecting the loss for a while, you may feel a sense of relief when it happens. Follow these instructions at home:    Activity Express your feelings in healthy ways, such as: Talking with others about your loss. It may be helpful to find others who have had a similar loss, such as a support group. Writing down your feelings in a journal. Doing physical activities to release stress and emotional energy. Doing creative activities like painting, sculpting, or playing or listening to music. Practicing resilience. This is the ability to recover and adjust after facing challenges. Reading some resources that encourage resilience may help you to learn ways to practice those behaviors. General instructions Be patient with yourself and others. Allow the grieving process to happen, and remember that grieving takes time. It is likely that you may never feel completely done with some grief. You may  find a way to move on while still cherishing memories and feelings about your loss. Accepting your loss is a process. It can take months or longer to adjust. Keep all follow-up visits as told by your health care provider. This is important. Where to find support To get support for managing loss: Ask your health care provider for help and recommendations, such as grief counseling or therapy. Think about joining a support group for people who are managing a loss. Where to find more information You can find more information about managing loss from: American Society of Clinical Oncology: www.cancer.net American Psychological Association: TVStereos.ch Contact a health care provider if: Your grief is extreme and keeps getting worse. You have ongoing grief that does not improve. Your body shows symptoms of grief, such as illness. You feel depressed, anxious, or lonely. Get help right away if: You have thoughts about hurting yourself or others. If you ever feel like you may hurt yourself or others, or have thoughts about taking your own life, get help right away. You can go to your nearest emergency department or call: Your local emergency services (911 in the [place] A  suicide crisis helpline, such as the [place] at (367)369-2642. This is open 24 hours a day. Summary Grief is the result of a major change or an absence of someone or something that you count on. Grief is a normal reaction to loss. The depth of grief and the period of recovery depend on the type of loss and your ability to adjust to the change and process your feelings. Processing grief requires patience and a willingness to accept your feelings and talk about your loss with people who are supportive. It is important to find resources that work for you and to realize that people experience grief differently. There is not one grieving process that works for everyone in the same way. Be aware that when  grief becomes extreme, it can lead to more severe issues like isolation, depression, anxiety, or suicidal thoughts. Talk with your health care provider if you have any of these issues. This information is not intended to replace advice given to you by your health care provider. Make sure you discuss any questions you have with your health care provider. Document Revised: 06/17/2018 Document Reviewed: 08/27/2016 Elsevier Patient Education  [place] for Managing Grief   When you are experiencing grief, many resources and support are available to help you. You are not alone.    Grief Share (https://www.https://jacobson-moore.net/):    [employer] [facility] [place], [place]  [phone] [address] [place], [place], [place], [place], [place]   [facility] [address] [place], [place]   [place], [place]   If a Hospice or [place] team has been involved in the care of your loved one, you may reach out to your contact there or locally, you may reach out to Hospice of [facility]:    Hospice and [place] [facility]   [place], [place] [address] [phone] Patient was educated on healthy self-care as she admits she has little motivation to walk lately. PCS recommendation was suggested.   Patient Self Care Activities:  Attends all scheduled provider appointments Calls provider office for new concerns or questions   Please see past updates related to this goal by clicking on the "Past Updates" button in the selected goal        Follow up:  Patient agrees to Care Plan and Follow-up.  Plan: The  Managed Medicaid care management team will reach out to the patient again over the next 45 days.  Eula Fried, BSW, MSW, [employer] Managed Medicaid LCSW [email or web address] Phone: [phone]

## 2021-02-26 NOTE — Patient Instructions (Signed)
Visit Information  Cheryl Chandler was given information about Medicaid Managed Care team care coordination services as a part of their [facility] Medicaid benefit. Cheryl Chandler verbally consented to engagement with the [facility] Managed Care team.   If you are experiencing a medical emergency, please call 911 or report to your local emergency department or urgent care.   If you have a non-emergency medical problem during routine business hours, please contact your provider's office and ask to speak with a nurse.   For questions related to your [facility] health plan, please call: [phone] or go here:[email or web address]  If you would like to schedule transportation through your [facility] plan, please call the following number at least 2 days in advance of your appointment: [phone].  Call the [facility] Crisis Line at [phone], at any time, 24 hours a day, 7 days a week. If you are in danger or need immediate medical attention call 911.  If you would like help to quit smoking, call 1-800-QUIT-NOW (334-837-9224) OR Espaol: 1-855-Djelo-Ya (8-921-194-1740) o para ms informacin haga clic aqu or Text READY to 814-481 to register via text  Cheryl Chandler - following are the goals we discussed in your visit today:   Goals Addressed               This Visit's Progress     SW-"I need to focus on taking better care of myself" (pt-stated)        Timeframe:  Long-Range Goal Priority:  High Start Date:       11/07/20                      Expected End Date:  ongoing                Follow Up Date- 04/02/21   - begin personal counseling - call and visit an old friend - check out volunteer opportunities - join a support group - laugh; watch a funny movie or comedian - learn and use visualization or guided imagery - perform a random act of kindness - practice relaxation or meditation daily - start or continue a personal journal - talk about  feelings with a friend, family or spiritual advisor - practice positive thinking and self-talk    Why is this important?   When you are stressed, down or upset, your body reacts too.  For example, your blood pressure may get higher; you may have a headache or stomachache.  When your emotions get the best of you, your body's ability to fight off cold and flu gets weak.  These steps will help you manage your emotions.    Current Barriers:  Financial constraints Mental Health Concerns  Social Isolation Limited education about mental health support resources that are available to her within the local area* Cognitive Deficits Lacks knowledge of community resource: Grief support resources/programs that are offered free of charge  within the area  Clinical Social Work Clinical Goal(s):  Over the next 90 days, client will work with SW to address concerns related to experiencing ongoing symptoms of depression, sadness and grief since the passing of her mother Over the next 90 days, patient will work with LCSW to address needs related to implementing appropriate self-care and depression management.   -Patient will notify [facility] team of any concerns. Contact numbers and information provided to patient.         Cheryl Chandler, BSW, MSW, [employer] Managed Medicaid LCSW [facility]  [facility] [email or web address]  Phone: [phone]

## 2021-03-05 ENCOUNTER — Ambulatory Visit: Payer: Medicaid Other | Admitting: Nurse Practitioner

## 2021-03-06 ENCOUNTER — Ambulatory Visit: Payer: Medicaid Other | Admitting: Nurse Practitioner

## 2021-03-08 ENCOUNTER — Other Ambulatory Visit: Payer: Self-pay | Admitting: Nurse Practitioner

## 2021-03-08 NOTE — Telephone Encounter (Signed)
Requested Prescriptions  Pending Prescriptions Disp Refills  . 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 nitroGLYCERIN (NITROSTAT) 0.4 MG SL tablet [Pharmacy Med Name: NITROGLYCERIN 0.4 MG SL TAB] 25 tablet 2    Sig: DISSOLVE 1 TABLET UNDER TONGUE AT ONSET OF CHEST PAIN. REPEAT IN 5 MIN IF NOT RESOLVED, MAX 3 DOSES. 911 IF NEEDED.     Cardiovascular:  Nitrates Failed - 03/08/2021 11:40 AM      Failed - Last BP in normal range    BP Readings from Last 1 Encounters:  02/05/21 (!) 152/81         Passed - Last Heart Rate in normal range    Pulse Readings from Last 1 Encounters:  02/05/21 76         Passed - Valid encounter within last 12 months    Recent Outpatient Visits          2 months ago Essential hypertension   [facility] [place], [name], DO   3 months ago Diabetic ulcer  of right heel associated with diabetes mellitus due to underlying condition, limited to breakdown of skin [facility])   [facility] [name], NP   3 months ago Chronic venous insufficiency   [facility] [place], [name], NP   4 months ago Chronic obstructive pulmonary disease, unspecified COPD type (HCC)   [facility] [name], NP   5 months ago Insect bite of right foot, initial encounter   [facility] [name], NP      Future Appointments            In 2 months [name], NP [facility], [facility]

## 2021-03-14 ENCOUNTER — Telehealth: Payer: Self-pay | Admitting: Pharmacist

## 2021-03-14 ENCOUNTER — Ambulatory Visit: Payer: Self-pay

## 2021-03-14 DIAGNOSIS — R3981 Functional urinary incontinence: Secondary | ICD-10-CM | POA: Diagnosis not present

## 2021-03-14 DIAGNOSIS — Z993 Dependence on wheelchair: Secondary | ICD-10-CM | POA: Diagnosis not present

## 2021-03-14 NOTE — Patient Outreach (Signed)
03/14/2021 Name: Cheryl Chandler MRN: [id number] DOB: [birth year]  Referred by: [name], NP Reason for referral : No chief complaint on file.   An unsuccessful telephone outreach was attempted today. The patient was referred to the case management team for assistance with care management and care coordination.    Follow Up Plan: The Managed Medicaid care management team will reach out to the patient again over the next 14 days.   Cheral Almas PharmD, CPP High Risk Managed Medicaid [place] [phone]

## 2021-03-24 ENCOUNTER — Other Ambulatory Visit: Payer: Self-pay

## 2021-03-24 ENCOUNTER — Other Ambulatory Visit: Payer: Self-pay | Admitting: Obstetrics and Gynecology

## 2021-03-24 DIAGNOSIS — I10 Essential (primary) hypertension: Secondary | ICD-10-CM

## 2021-03-24 NOTE — Patient Instructions (Signed)
Visit Information  Cheryl Chandler was given information about Medicaid Managed Care team care coordination services as a part of their [facility] Medicaid benefit. Cheryl Chandler verbally consented to engagement with the [facility] Managed Care team.   If you are experiencing a medical emergency, please call 911 or report to your local emergency department or urgent care.   If you have a non-emergency medical problem during routine business hours, please contact your provider's office and ask to speak with a nurse.   For questions related to your [facility] health plan, please call: [phone] or go here:[email or web address]  If you would like to schedule transportation through your [facility] plan, please call the following number at least 2 days in advance of your appointment: [phone].  Call the [place] at [phone], at any time, 24 hours a day, 7 days a week. If you are in danger or need immediate medical attention call 911.  If you would like help to quit smoking, call 1-800-QUIT-NOW 769-426-7808) OR Espaol: 1-855-Djelo-Ya (6-815-947-0761) o para ms informacin haga clic aqu or Text READY to 200-400 to register via text  Cheryl Chandler - following are the goals we discussed in your visit today:   Goals Addressed             This Visit's Progress    RNCM: Keep Skin Clean and Dry       Follow Up Date: 04/23/21  - clean and dry skin well - keep skin dry - use a fragrance-free lotion on skin - wear a protective pad or garment    Why is this important?   Leaking urine (pee) can cause soreness from skin rashes and redness.  This is caused by skin being exposed to urine (pee).    12-02-2020: The patient states she currently has a wound on the bottom of her right foot. She was seen in the ER on 11-20-2020. The patient is currently taking antibiotics. She has a follow up with the pcp on 12-04-2020. Advised the patient to keep  appointment with pcp and got back to the ER for fever, purulent drainage, odor, or changes in wound. The patient verbalized understanding.   (/1/22:  patient has appointment with Dr. Posey Pronto 01/07/21 for follow up-still taking antibiotics. Update 01/22/21:  Patient states right foot wound is healing-has follow up appointment scheduled. Update 02/21/21:  Patient just finished antibiotic for foot-to follow up with provider. Update 03/24/21:  Patient has upcoming appointment with provider.     COMPLETED: RNCM: Track and Manage My Symptoms-COPD       Timeframe:  Long-Range Goal Priority:  Medium Start Date:                             Expected End Date:                     Follow Up Date: 04/23/21   - develop a rescue plan - eliminate symptom triggers at home - follow rescue plan if symptoms flare-up - keep follow-up appointments - use an extra pillow to sleep    Why is this important?   Tracking your symptoms and other information about your health helps your doctor plan your care.  Write down the symptoms, the time of day, what you were doing and what medicine you are taking.  You will soon learn how to manage your symptoms.   Update 03/24/21:  patient is stable  RNCM: Track and Manage My Symptoms-Depression       Timeframe:  Long-Range Goal Priority:  High Start Date:    12-02-2020                         Expected End Date:   ongoing                Follow Up Date: 04/23/21   - avoid negative self-talk - develop a personal safety plan - develop a plan to deal with triggers like holidays, anniversaries - exercise at least 2 to 3 times per week - have a plan for how to handle bad days - journal feelings and what helps to feel better or worse - spend time or talk with others at least 2 to 3 times per week - spend time or talk with others every day - watch for early signs of feeling worse    Why is this important?   Keeping track of your progress will help your treatment team  find the right mix of medicine and therapy for you.  Write in your journal every day.  Day-to-day changes in depression symptoms are normal. It may be more helpful to check your progress at the end of each week instead of every day.     Notes: 12-02-2020: The patient states she is having a difficult time because of all the health conditions she is faced with right now. She has a wound on her right foot and is currently taking abx.  She hasn't felt well. She comes back to see the pcp on 12-04-2020. Education on working with the CCM team to help manage health and well being.  Update 12/26/20:  patient has appointment with LCSW 01/01/21 Update 01/22/21:  LCSW continues to follow. Update10/28/22:  LCSW appt to be rescheduled-patient unavailable at last appt.\Update 03/24/21:  Patient being followed by LCSW     RNCM: Track and Manage My Triggers-COPD       Timeframe:  Long-Range Goal Priority:  Medium Start Date:     05-22-2020                        Expected End Date:         05-22-2021              Follow Up Date 30 to 60 days   - avoid second hand smoke - eliminate smoking in my home - identify and avoid work-related triggers - identify and remove indoor air pollutants - limit outdoor activity during cold weather - listen for public air quality announcements every day    Why is this important?   Triggers are activities or things, like tobacco smoke or cold weather, that make your COPD (chronic obstructive pulmonary disease) flare-up.  Knowing these triggers helps you plan how to stay away from them.  When you cannot remove them, you can learn how to manage them.     12-02-2020: The patient states the ER doctor told her that she needs oxygen. The patient states that she has been taking her oxygen levels and they have been in the 80's. Recommended the patient to write down values and bring to the next pcp visit.  Update 12/26/20:  patient continues to check oxygen saturations-WNL so far. Update 01/22/21:   No complaints today. Update 02/21/21:  Patient has SOB, uses inhalers as needed, ? Need for Oxygen-to follow up with provider, Sat 97% today. Update 03/24/21:  No change-has PCP appt scheduled.  Oxygen Saturations WNL.     The patient verbalized understanding of instructions provided today and declined a print copy of patient instruction materials.   The Managed Medicaid care management team will reach out to the patient again over the next 30 days.  The  Patient has been provided with contact information for the Managed Medicaid care management team and has been advised to call with any health related questions or concerns.   Aida Raider RN, BSN [place]  [facility] [employer] - Managed Medicaid High Risk [phone].   Following is a copy of your plan of care:  Care Plan : RNCM: COPD (Adult)  Updates made by [name], RN since 03/24/2021 12:00 AM     Problem: RNCM: Psychological Adjustment to Diagnosis (COPD)   Priority: Medium  Onset Date: 05/22/2020     Long-Range Goal: RNCM: Adjustment to Disease Achieved   Start Date: 05/22/2020  Expected End Date: 03/27/2022  Recent Progress: On track  Priority: High  Note:   Current Barriers:  Knowledge deficits related to basic understanding of COPD disease process Knowledge deficits related to basic COPD self care/management Knowledge deficit related to basic understanding of how to use inhalers and how inhaled medications work Knowledge deficit related to importance of energy conservation Limited Social Support Unable to independently manage COPD Lacks social connections Does not contact provider office for questions/concerns  Case Manager Clinical Goal(s): patient will report using inhalers as prescribed including rinsing mouth after use patient will be able to verbalize understanding of COPD action plan and when to seek appropriate levels of medical care  patient will engage in lite  exercise as tolerated to build/regain stamina and strength and reduce shortness of breath through activity tolerance  patient will verbalize basic understanding of COPD disease process and self care activities  Interventions:  Collaboration with [name], NP regarding development and update of comprehensive plan of care as evidenced by provider attestation and co-signature Inter-disciplinary care team collaboration (see longitudinal plan of care) UNABLE to independently: manage COPD Provided patient with basic written and verbal COPD education on self care/management/and exacerbation prevention Update 02/21/21:  patient has SOB and uses inhaler as needed-Oxygen Sat to day 97% Update 03/24/21:  No change-Oxygen saturations in the 90s.  Patient has appt 04/01/21 with provider. Provided patient with COPD action plan and reinforced importance of daily self assessment.  Provided written and verbal instructions on pursed lip breathing and utilized returned demonstration as teach back Provided instruction about proper use of medications used for management of COPD including inhalers Advised patient to self assesses COPD action plan zone and make appointment with provider if in the yellow zone for 48 hours without improvement. Provided patient with education about the role of exercise in the management of COPD Advised patient to engage in light exercise as tolerated 3-5 days a week Provided education about and advised patient to utilize infection prevention strategies to reduce risk of respiratory infection. Patient Goals/Self-Care Activities:  - decision-making supported - depression screen reviewed - emotional support provided - family involvement promoted - problem-solving facilitated - relaxation techniques promoted - verbalization of feelings encouraged Follow Up Plan: Telephone follow up appointment with care management team member scheduled for: 12-02-2020: The patient transitioning to  the [facility] team and will be followed by the CCM team for [facility]. The patient knows to expect a call from the scheduler to schedule with HRMM   Problem: RNCM: Symptom Exacerbation (COPD)  Priority: High  Onset Date: 05/22/2020     Long-Range Goal: RNCM: Smoking Cessation Symptom Exacerbation Prevented or Minimized   Start Date: 05/22/2020  Expected End Date: 03/27/2022  Recent Progress: Not on track  Priority: High  Note:   Current Barriers:  Unable to independently stop smoking  Does not adhere to provider recommendations re: smoking cessation  Lacks social connections Does not contact provider office for questions/concerns Tobacco abuse of >40 years; currently smoking 1 pack every 2 weeks Previous quit attempts, unsuccessful several successful using patches, gum- has a pattern of stopping and then starting back  Reports smoking within 30 minutes of waking up Reports triggers to smoke include: stress, chronic conditions  Reports motivation to quit smoking includes: knows it will help her breathing and other chronic conditions  On a scale of 1-10, reports MOTIVATION to quit is 8 On a scale of 1-10, reports CONFIDENCE in quitting is 7 Update 02/21/21:  patient with SOB, uses inhalers as needed-Oxygen Sat 97% today. Update 03/24/21:  No change -patient has follow up appointment scheduled with provider Clinical Goal(s):  patient will work with RN Case Manager Licensed Clinical Social Worker and provider towards tobacco cessation  Interventions: Collaboration with [name], NP regarding development and update of comprehensive plan of care as evidenced by provider attestation and co-signature Inter-disciplinary care team collaboration (see longitudinal plan of care) Evaluation of current treatment plan reviewed. 12-02-2020: The patient is under a lot of stress and says smoking is what she does to  help with her stress relief. Update 12/26/20:  Patient smoking 4-5 cigarettes a day, trying to  quit, discussed contacting PCP.  Update 01/22/21:  Patient states she is smoking a pack every 2 weeks, has tried gum. Update 02/21/21:  No change. Provided contact information for [place] [facility] (1-800-QUIT-NOW). Patient will outreach this group for support. Update 01/22/21:  Patient states she has utilized [employer] for resources. Discussed plans with patient for ongoing care management follow up and provided patient with direct contact information for care management team Provided patient with printed smoking cessation educational materials Provided contact information for [place] [facility] (1-800-QUIT-NOW). Patient will outreach this group for support. Evaluation of current treatment plan reviewed-12-02-2020:  currently the patient is cutting back but wants to quit. Patient knows she can not smoke and use oxygen.  Patient Goals/Self-Care Activities patient will:  - mindfulness, talking to family and friends as a habit during cravings  - verbally commit to reducing tobacco consumption - barriers to lifestyle changes reviewed and addressed - barriers to treatment reviewed and addressed - breathing techniques encouraged - modification of home and work environment promoted - rescue (action) plan reviewed - signs/symptoms of infection reviewed - signs/symptoms of worsening disease assessed - treatment plan reviewed Follow Up Plan: Telephone follow up appointment with care management team member scheduled. The patient is transitioning to the [facility] team and will be followed by new RNCM. The patient accepts transfer today. 12-02-2020   Care Plan : RNCM: Hypertension (Adult)  Updates made by [name], RN since 03/24/2021 12:00 AM     Problem: RNCM: Hypertension (Hypertension)   Priority: Medium  Onset Date: 05/22/2020     Long-Range Goal: RNCM: Hypertension Monitored   Start Date: 05/22/2020  Expected End Date: 03/27/2022  Recent Progress: On track  Priority: Medium  Note:   Objective:  Last  practice recorded BP readings:  BP Readings from Last 3 Encounters:  11/20/20 (!) 154/68  11/20/20 113/60  10/30/20  126/67    Most recent eGFR/CrCl: No results found for: EGFR  No components found for: CRCL Current Barriers:  Knowledge Deficits related to basic understanding of hypertension pathophysiology and self care management Knowledge Deficits related to understanding of medications prescribed for management of hypertension Limited Social Support Unable to independently manage HTN Lacks social connections Does not contact provider office for questions/concerns Case Manager Clinical Goal(s):   patient will verbalize understanding of plan for hypertension management patient will demonstrate improved adherence to prescribed treatment plan for hypertension as evidenced by taking all medications as prescribed, monitoring and recording blood pressure as directed, adhering to low sodium/DASH diet patient will demonstrate improved health management independence as evidenced by checking blood pressure as directed and notifying PCP if SBP>160 or DBP > 90, taking all medications as prescribe, and adhering to a low sodium diet as discussed. Interventions:  Collaboration with [name], NP regarding development and update of comprehensive plan of care as evidenced by provider attestation and co-signature Inter-disciplinary care team collaboration (see longitudinal plan of care) Evaluation of current treatment plan related to hypertension self management and patient's adherence to plan as established by provider.  Update 02/21/21:  patient has appt with PCP 03/04/21 for follow up-diastolic blood pressure in 90s per patient. Update 03/24/21:  Patient had to reschedule appointment, 04/01/21-BP WNL per patient at this time-checks three times a day Provided education to patient re: stroke prevention, s/s of heart attack and stroke, DASH diet, complications of uncontrolled blood pressure Reviewed  medications with patient and discussed importance of compliance.  Discussed plans with patient for ongoing care management follow up and provided patient with direct contact information for care management team Advised patient, providing education and rationale, to monitor blood pressure daily and record, calling PCP for findings outside established parameters.  Patient Goals/Self-Care Activities Over the next 120 days, patient will:  - Self administers medications as prescribed Attends all scheduled provider appointments Calls provider office for new concerns, questions, or BP outside discussed parameters Checks BP and records as discussed Follows a low sodium diet/DASH diet - blood pressure trends reviewed - depression screen reviewed - home or ambulatory blood pressure monitoring encouraged Follow Up Plan: Telephone follow up appointment with care management team member scheduled.  Transitioning to [facility] team. Will follow up with them at scheduled time.    Care Plan : RNCM: Depression (Adult)  Updates made by [name], RN since 03/24/2021 12:00 AM     Problem: RNCM: Symptoms (Depression)   Priority: High  Onset Date: 12/02/2020     Long-Range Goal: RNCM: Symptoms Monitored and Managed   Start Date: 12/02/2020  Expected End Date: 03/27/2022  Recent Progress: Not on track  Priority: High  Note:   Current Barriers:  Ineffective Self Health Maintenance in a patient with Anxiety and Depression Unable to independently manage depression and anxiety as evidence of multiple chronic conditions and the patient feeling her health is out of control  Does not attend all scheduled provider appointments Lacks social connections Unable to perform IADLs independently Does not contact provider office for questions/concerns Clinical Goal(s):  Collaboration with [name], NP regarding development and update of comprehensive plan of care as evidenced by provider attestation and  co-signature Inter-disciplinary care team collaboration (see longitudinal plan of care) patient will work with care management team to address care coordination and chronic disease management needs related to Disease Management Educational Needs Care Coordination Mental Health Counseling Level of Care Concerns   Interventions:  Evaluation  of current treatment plan related to Anxiety and Depression, Financial constraints related to insurance not approving care for the patient to go to wound care , Limited social support, Level of care concerns, ADL IADL limitations, Mental Health Concerns , Family and relationship dysfunction, Substance abuse issues -  smoker , and Inability to perform IADL's independently self-management and patient's adherence to plan as established by provider. Collaboration with [name], NP regarding development and update of comprehensive plan of care as evidenced by provider attestation       and co-signature Inter-disciplinary care team collaboration (see longitudinal plan of care) Discussed plans with patient for ongoing care management follow up and provided patient with direct contact information for care management team Evaluation of depression and anxiety.   Update 02/21/21:  LCSW following patient Self Care Activities:  Patient verbalizes understanding of plan to effectively manage depression and anxiety Self administers medications as prescribed Attends all scheduled provider appointments Calls pharmacy for medication refills Attends church or other social activities Performs ADL's independently Performs IADL's independently Calls provider office for new concerns or questions Work with the [facility] team to meet needs for continued CCM services  Patient Goals: - activity or exercise based on tolerance encouraged - depression screen reviewed - emotional support provided - healthy lifestyle promoted - medication side effects monitored and managed - pain  managed - participation in mental health treatment encouraged - quality of sleep assessed - response to mental health treatment monitored - response to pharmacologic therapy monitored - sleep hygiene techniques encouraged - social activities and relationships encouraged - substance use assessed Follow Up Plan: The care management team will reach out to the patient again over the next 30 to 60 days.  The patient transitioning over to the [facility] team

## 2021-03-24 NOTE — Patient Outreach (Signed)
Medicaid Managed Care   Nurse Care Manager Note  03/24/2021 Name:  Cheryl Chandler MRN:  [id number] DOB:  [birth year]  Cheryl Chandler is an 55 y.o. year old female who is a primary patient of Cheryl Billings, NP.  The [facility] Managed Care Coordination team was consulted for assistance with:    Chronic healthcare management needs.  Ms. Graybeal was given information about Medicaid Managed Care Coordination team services today. [place] Patient agreed to services and verbal consent obtained.  Engaged with patient by telephone for follow up visit in response to provider referral for case management and/or care coordination services.   Assessments/Interventions:  Review of past medical history, allergies, medications, health status, including review of consultants reports, laboratory and other test data, was performed as part of comprehensive evaluation and provision of chronic care management services.  SDOH (Social Determinants of Health) assessments and interventions performed: [name]    Flowsheet Row Most Recent Value  [name]   Physical Activity Interventions Other (Comments)  [limited mobility-uses wheelchair]  Transportation Interventions Other (Comment)  [borrows father in [place] car-has insurance transportation information]       Care Plan  Allergies  Allergen Reactions   Codeine Shortness Of Breath and Rash   Percocet [Oxycodone-Acetaminophen] Shortness Of Breath   Sulfa Antibiotics Shortness Of Breath and Rash   Aspirin Hives   Bee Venom     Bee stings   Darvon [Propoxyphene]     Darvocet-N 100   Latex    Oxycodone Nausea And Vomiting   Penicillins     Has patient had a PCN reaction causing immediate rash, facial/tongue/throat swelling, SOB or lightheadedness with hypotension: Unknown Has patient had a PCN reaction causing severe rash involving mucus membranes or skin necrosis: Unknown Has patient had a PCN reaction  that required hospitalization: Unknown Has patient had a PCN reaction occurring within the last 10 years: Unknown If all of the above answers are "NO", then may proceed with Cephalosporin use.    Medications Reviewed Today     Reviewed by Cheryl Medicus, RN (Registered Nurse) on 03/24/21 at 24  Med List Status: <None>   Medication Order Taking? Sig Documenting Provider Last Dose Status Informant  ACCU-CHEK GUIDE test strip [id number] No  [provider] Taking Active   Accu-Chek Softclix Lancets lancets [id number] No CHECK BLOOD SUGAR TWICE DAILY Cheryl Billings, NP Taking Active   albuterol (PROVENTIL) (2.5 MG/3ML) 0.083% nebulizer solution [id number] No INHALE CONTENTS OF 1 VIAL IN NEBULIZER EVERY 4 HOURS AS NEEDED Cheryl Billings, NP Taking Active   amLODipine (NORVASC) 5 MG tablet [id number]  Take 2 tablets (10 mg total) by mouth daily. Chandler, Cheryl P, DO  Active   atorvastatin (LIPITOR) 80 MG tablet [id number] No Take 1 tablet (80 mg total) by mouth daily. Cheryl Billings, NP Taking Active   Blood Glucose Monitoring Suppl (ACCU-CHEK GUIDE) w/Device KIT [id number] No Use to check blood sugar 3 times a day. Cheryl Guarneri T, NP Taking Active   carvedilol (COREG) 3.125 MG tablet [id number] No Take 1 tablet (3.125 mg total) by mouth 2 (two) times daily with a meal. Cheryl Gip, DO Taking Active   citalopram (CELEXA) 20 MG tablet [id number] No TAKE 1 TABLET BY MOUTH ONCE DAILY Cheryl Billings, NP Taking Active   EPINEPHrine 0.3 mg/0.3 mL IJ SOAJ injection [id number] No USE AS DIRECTED. Cheryl Billings, NP Taking Active   furosemide (LASIX) 20 MG tablet [id number]  TAKE 1 TABLET BY MOUTH ONCE DAILY  IN AFTERNOON AS NEEDED LEG EDEMA Cheryl Billings, NP  Active   furosemide (LASIX) 40 MG tablet [id number] No Take 1 tablet (40 mg total) by mouth daily. Cheryl American, PA-C Taking Active            Med Note [name] Oct 14, 2020  1:41 PM) Told to take in  AM, not afternoon  gabapentin (NEURONTIN) 300 MG capsule [id number]  TAKE 1 CAPSULE BY MOUTH 3 TIMES DAILY ASNEEDED (NEED OFFICE VISIT FOR FURTHER REFILLS) Cheryl Billings, NP  Active   levothyroxine (SYNTHROID) 50 MCG tablet [id number] No TAKE 1 TABLET BY MOUTH ONCE DAILY ON AN EMPTY STOMACH. WAIT 30 MINUTES BEFORE TAKING OTHER MEDS. Cheryl Billings, NP Taking Active   lisinopril (ZESTRIL) 40 MG tablet [id number] No TAKE 1 TABLET BY MOUTH ONCE DAILY Cheryl Billings, NP Taking Active   meloxicam (MOBIC) 15 MG tablet [id number]  TAKE 1 TABLET BY MOUTH ONCE DAILY AS NEEDED WITH FOOD OR MILK Cheryl Billings, NP  Active   methocarbamol (ROBAXIN) 750 MG tablet [id number] No TAKE 1-2 TABLETS BY MOUTH 3 TIMES DAILY AS NEEDED Chandler, Cheryl T, NP Taking Active   mometasone-formoterol (DULERA) 100-5 MCG/ACT AERO [id number] No INHALE 2 PUFFS BY MOUTH TWICE A DAY. RINSE MOUTH AFTER USE. Cheryl American, PA-C Taking Active   montelukast (SINGULAIR) 10 MG tablet [id number] No Take 1 tablet (10 mg total) by mouth at bedtime. Cheryl Billings, NP Taking Active   nitroGLYCERIN (NITROSTAT) 0.4 MG SL tablet [id number]  DISSOLVE 1 TABLET UNDER TONGUE AT ONSET OF CHEST PAIN. REPEAT IN 5 MIN IF NOT RESOLVED, MAX 3 DOSES. 911 IF NEEDED. Cheryl Billings, NP  Active   omeprazole (PRILOSEC) 20 MG capsule [id number] No Take 1 capsule (20 mg total) by mouth daily. Cheryl Billings, NP Taking Active   QUEtiapine (SEROQUEL) 25 MG tablet [id number] No TAKE 1 TABLET BY MOUTH AT BEDTIME Cheryl Billings, NP Taking Active   SPIRIVA HANDIHALER 18 MCG inhalation capsule [id number]  INHALE CONTENTS OF 1 CAPSULE ONCE DAILY Cheryl Billings, NP  Active   triamcinolone cream (KENALOG) 0.1 % [id number] No APPLY 1 APPLICATION TOPICALLY TWICE DAILY Cheryl Lick, NP Taking Active   TRULICITY 7.68 TL/5.7WI SOPN [id number] No INJECT 0.75MG SUBQ ONCE A WEEK Cheryl Billings, NP Taking Active   Vitamin D, Ergocalciferol, (DRISDOL)  1.25 MG (50000 UNIT) CAPS capsule [id number] No TAKE 1 CAPSULE BY MOUTH ONCE EVERY 7 DAYS Cheryl Billings, NP Taking Active             Patient Active Problem List   Diagnosis Date Noted   Fibrocystic disease of both breasts 05/07/2019   IFG (impaired fasting glucose) 05/03/2019   Chronic venous insufficiency 11/09/2018   Overactive bladder 09/30/2018   Peripheral neuropathy 09/30/2018   Apnea 01/02/2016   Lymphedema 11/30/2014   Dyspnea on exertion 11/30/2014   Anxiety 11/29/2014   Arthritis 11/29/2014   COPD (chronic obstructive pulmonary disease) ([place]) 11/29/2014   Clinical depression 11/29/2014   H/O eating disorder 11/29/2014   Esophageal reflux 11/29/2014   Acne inversa 11/29/2014   Essential hypertension 11/29/2014   HLD (hyperlipidemia) 11/29/2014   Adult hypothyroidism 11/29/2014   Insomnia 11/29/2014   Low back pain 11/29/2014   Morbid obesity ([place]) 11/29/2014   Posttraumatic stress disorder 11/29/2014   Vitamin D deficiency 11/29/2014   Nicotine dependence, cigarettes, uncomplicated 80/32/1224    Conditions to be addressed/monitored per PCP order:   chronic healthcare managment needs, COPD, DM, tobacco use, depression,  anxiety, PTSD LBP, HLD, insomnia,HTN, GERD, lympedema  Care Plan : RNCM: COPD (Adult)  Updates made by Cheryl Medicus, RN since 03/24/2021 12:00 AM     Problem: RNCM: Psychological Adjustment to Diagnosis (COPD)   Priority: Medium  Onset Date: 05/22/2020     Long-Range Goal: RNCM: Adjustment to Disease Achieved   Start Date: 05/22/2020  Expected End Date: 03/27/2022  Recent Progress: On track  Priority: High  Note:   Current Barriers:  Knowledge deficits related to basic understanding of COPD disease process Knowledge deficits related to basic COPD self care/management Knowledge deficit related to basic understanding of how to use inhalers and how inhaled medications work Knowledge deficit related to importance of energy  conservation Limited Social Support Unable to independently manage COPD Lacks social connections Does not contact provider office for questions/concerns  Case Manager Clinical Goal(s): patient will report using inhalers as prescribed including rinsing mouth after use patient will be able to verbalize understanding of COPD action plan and when to seek appropriate levels of medical care  patient will engage in lite exercise as tolerated to build/regain stamina and strength and reduce shortness of breath through activity tolerance  patient will verbalize basic understanding of COPD disease process and self care activities  Interventions:  Collaboration with Cheryl Billings, NP regarding development and update of comprehensive plan of care as evidenced by provider attestation and co-signature Inter-disciplinary care team collaboration (see longitudinal plan of care) UNABLE to independently: manage COPD Provided patient with basic written and verbal COPD education on self care/management/and exacerbation prevention Update 02/21/21:  patient has SOB and uses inhaler as needed-Oxygen Sat to day 97% Update 03/24/21:  No change-Oxygen saturations in the 90s.  Patient has appt 04/01/21 with provider. Provided patient with COPD action plan and reinforced importance of daily self assessment.  Provided written and verbal instructions on pursed lip breathing and utilized returned demonstration as teach back Provided instruction about proper use of medications used for management of COPD including inhalers Advised patient to self assesses COPD action plan zone and make appointment with provider if in the yellow zone for 48 hours without improvement. Provided patient with education about the role of exercise in the management of COPD Advised patient to engage in light exercise as tolerated 3-5 days a week Provided education about and advised patient to utilize infection prevention strategies to reduce risk  of respiratory infection. Patient Goals/Self-Care Activities:  - decision-making supported - depression screen reviewed - emotional support provided - family involvement promoted - problem-solving facilitated - relaxation techniques promoted - verbalization of feelings encouraged Follow Up Plan: Telephone follow up appointment with care management team member scheduled for: 12-02-2020: The patient transitioning to the [facility] team and will be followed by the CCM team for [facility]. The patient knows to expect a call from the scheduler to schedule with HRMM   Problem: RNCM: Symptom Exacerbation (COPD)   Priority: High  Onset Date: 05/22/2020     Long-Range Goal: RNCM: Smoking Cessation Symptom Exacerbation Prevented or Minimized   Start Date: 05/22/2020  Expected End Date: 03/27/2022  Recent Progress: Not on track  Priority: High  Note:   Current Barriers:  Unable to independently stop smoking  Does not adhere to provider recommendations re: smoking cessation  Lacks social connections Does not contact provider office for questions/concerns Tobacco abuse of >40 years; currently smoking 1 pack every 2 weeks Previous quit attempts, unsuccessful several successful using patches, gum- has a pattern of stopping and then starting back  Reports smoking within 30 minutes of waking up Reports triggers to smoke include: stress, chronic conditions  Reports motivation to quit smoking includes: knows it will help her breathing and other chronic conditions  On a scale of 1-10, reports MOTIVATION to quit is 8 On a scale of 1-10, reports CONFIDENCE in quitting is 7 Update 02/21/21:  patient with SOB, uses inhalers as needed-Oxygen Sat 97% today. Update 03/24/21:  No change -patient has follow up appointment scheduled with provider Clinical Goal(s):  patient will work with RN Case Manager Licensed Clinical Social Worker and provider towards tobacco cessation  Interventions: Collaboration with Cheryl Billings, NP regarding development and update of comprehensive plan of care as evidenced by provider attestation and co-signature Inter-disciplinary care team collaboration (see longitudinal plan of care) Evaluation of current treatment plan reviewed. 12-02-2020: The patient is under a lot of stress and says smoking is what she does to  help with her stress relief. Update 12/26/20:  Patient smoking 4-5 cigarettes a day, trying to quit, discussed contacting PCP.  Update 01/22/21:  Patient states she is smoking a pack every 2 weeks, has tried gum. Update 02/21/21:  No change. Provided contact information for [place] [facility] (1-800-QUIT-NOW). Patient will outreach this group for support. Update 01/22/21:  Patient states she has utilized [employer] for resources. Discussed plans with patient for ongoing care management follow up and provided patient with direct contact information for care management team Provided patient with printed smoking cessation educational materials Provided contact information for [place] [facility] (1-800-QUIT-NOW). Patient will outreach this group for support. Evaluation of current treatment plan reviewed-12-02-2020:  currently the patient is cutting back but wants to quit. Patient knows she can not smoke and use oxygen.  Patient Goals/Self-Care Activities patient will:  - mindfulness, talking to family and friends as a habit during cravings  - verbally commit to reducing tobacco consumption - barriers to lifestyle changes reviewed and addressed - barriers to treatment reviewed and addressed - breathing techniques encouraged - modification of home and work environment promoted - rescue (action) plan reviewed - signs/symptoms of infection reviewed - signs/symptoms of worsening disease assessed - treatment plan reviewed Follow Up Plan: Telephone follow up appointment with care management team member scheduled. The patient is transitioning to the [facility] team and will be followed by new RNCM.  The patient accepts transfer today. 12-02-2020    Care Plan : RNCM: Hypertension (Adult)  Updates made by Cheryl Medicus, RN since 03/24/2021 12:00 AM     Problem: RNCM: Hypertension (Hypertension)   Priority: Medium  Onset Date: 05/22/2020     Long-Range Goal: RNCM: Hypertension Monitored   Start Date: 05/22/2020  Expected End Date: 03/27/2022  Recent Progress: On track  Priority: Medium  Note:   Objective:  Last practice recorded BP readings:  BP Readings from Last 3 Encounters:  11/20/20 (!) 154/68  11/20/20 113/60  10/30/20 126/67    Most recent eGFR/CrCl: No results found for: EGFR  No components found for: CRCL Current Barriers:  Knowledge Deficits related to basic understanding of hypertension pathophysiology and self care management Knowledge Deficits related to understanding of medications prescribed for management of hypertension Limited Social Support Unable to independently manage HTN Lacks social connections Does not contact provider office for questions/concerns Case Manager Clinical Goal(s):   patient will verbalize understanding of plan for hypertension management patient will demonstrate improved adherence to prescribed treatment plan for hypertension as evidenced by taking all medications as prescribed, monitoring and recording blood  pressure as directed, adhering to low sodium/DASH diet patient will demonstrate improved health management independence as evidenced by checking blood pressure as directed and notifying PCP if SBP>160 or DBP > 90, taking all medications as prescribe, and adhering to a low sodium diet as discussed. Interventions:  Collaboration with Cheryl Billings, NP regarding development and update of comprehensive plan of care as evidenced by provider attestation and co-signature Inter-disciplinary care team collaboration (see longitudinal plan of care) Evaluation of current treatment plan related to hypertension self management and patient's  adherence to plan as established by provider.  Update 02/21/21:  patient has appt with PCP 03/04/21 for follow up-diastolic blood pressure in 90s per patient. Update 03/24/21:  Patient had to reschedule appointment, 04/01/21-BP WNL per patient at this time-checks three times a day Provided education to patient re: stroke prevention, s/s of heart attack and stroke, DASH diet, complications of uncontrolled blood pressure Reviewed medications with patient and discussed importance of compliance.  Discussed plans with patient for ongoing care management follow up and provided patient with direct contact information for care management team Advised patient, providing education and rationale, to monitor blood pressure daily and record, calling PCP for findings outside established parameters.  Patient Goals/Self-Care Activities Over the next 120 days, patient will:  - Self administers medications as prescribed Attends all scheduled provider appointments Calls provider office for new concerns, questions, or BP outside discussed parameters Checks BP and records as discussed Follows a low sodium diet/DASH diet - blood pressure trends reviewed - depression screen reviewed - home or ambulatory blood pressure monitoring encouraged Follow Up Plan: Telephone follow up appointment with care management team member scheduled.  Transitioning to [facility] team. Will follow up with them at scheduled time.     Care Plan : RNCM: Depression (Adult)  Updates made by Cheryl Medicus, RN since 03/24/2021 12:00 AM     Problem: RNCM: Symptoms (Depression)   Priority: High  Onset Date: 12/02/2020     Long-Range Goal: RNCM: Symptoms Monitored and Managed   Start Date: 12/02/2020  Expected End Date: 03/27/2022  Recent Progress: Not on track  Priority: High  Note:   Current Barriers:  Ineffective Self Health Maintenance in a patient with Anxiety and Depression Unable to independently manage depression and anxiety as evidence  of multiple chronic conditions and the patient feeling her health is out of control  Does not attend all scheduled provider appointments Lacks social connections Unable to perform IADLs independently Does not contact provider office for questions/concerns Clinical Goal(s):  Collaboration with Cheryl Billings, NP regarding development and update of comprehensive plan of care as evidenced by provider attestation and co-signature Inter-disciplinary care team collaboration (see longitudinal plan of care) patient will work with care management team to address care coordination and chronic disease management needs related to Disease Management Educational Needs Care Coordination Mental Health Counseling Level of Care Concerns   Interventions:  Evaluation of current treatment plan related to Anxiety and Depression, Financial constraints related to insurance not approving care for the patient to go to wound care , Limited social support, Level of care concerns, ADL IADL limitations, Mental Health Concerns , Family and relationship dysfunction, Substance abuse issues -  smoker , and Inability to perform IADL's independently self-management and patient's adherence to plan as established by provider. Collaboration with Cheryl Billings, NP regarding development and update of comprehensive plan of care as evidenced by provider attestation       and co-signature Inter-disciplinary care team collaboration (see longitudinal plan  of care) Discussed plans with patient for ongoing care management follow up and provided patient with direct contact information for care management team Evaluation of depression and anxiety.   Update 02/21/21:  LCSW following patient Self Care Activities:  Patient verbalizes understanding of plan to effectively manage depression and anxiety Self administers medications as prescribed Attends all scheduled provider appointments Calls pharmacy for medication refills Attends  church or other social activities Performs ADL's independently Performs IADL's independently Calls provider office for new concerns or questions Work with the [facility] team to meet needs for continued CCM services  Patient Goals: - activity or exercise based on tolerance encouraged - depression screen reviewed - emotional support provided - healthy lifestyle promoted - medication side effects monitored and managed - pain managed - participation in mental health treatment encouraged - quality of sleep assessed - response to mental health treatment monitored - response to pharmacologic therapy monitored - sleep hygiene techniques encouraged - social activities and relationships encouraged - substance use assessed Follow Up Plan: The care management team will reach out to the patient again over the next 30 to 60 days.  The patient transitioning over to the [facility] team    Follow Up:  Patient agrees to Care Plan and Follow-up.  Plan: The Managed Medicaid care management team will reach out to the patient again over the next 30 days. and The  Patient has been provided with contact information for the Managed Medicaid care management team and has been advised to call with any health related questions or concerns.  Date/time of next scheduled RN care management/care coordination outreach:  04/23/21 at 0900.

## 2021-03-27 DIAGNOSIS — Z419 Encounter for procedure for purposes other than remedying health state, unspecified: Secondary | ICD-10-CM | POA: Diagnosis not present

## 2021-04-01 ENCOUNTER — Ambulatory Visit: Payer: Medicaid Other | Admitting: Podiatry

## 2021-04-04 ENCOUNTER — Other Ambulatory Visit: Payer: Self-pay | Admitting: Nurse Practitioner

## 2021-04-04 NOTE — Telephone Encounter (Signed)
Requested Prescriptions  Pending Prescriptions Disp Refills  . 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

## 2021-04-04 NOTE — Telephone Encounter (Signed)
Requested medication (s) are due for refill today: yes  Requested medication (s) are on the active medication list: yes  Last refill:  03/08/21  Future visit scheduled: 06/04/21  Notes to clinic:  Historical Provider, please assess.  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

## 2021-04-05 NOTE — Telephone Encounter (Signed)
Requested Prescriptions  Pending Prescriptions Disp Refills  . meloxicam (MOBIC) 15 MG tablet [Pharmacy Med Name: MELOXICAM 15 MG TAB] 90 tablet 0    Sig: TAKE 1 TABLET BY MOUTH ONCE DAILY AS NEEDED WITH FOOD OR MILK     Analgesics:  COX2 Inhibitors Passed - 04/04/2021  6:11 PM      Passed - HGB in normal range and within 360 days    Hemoglobin  Date Value Ref Range Status  02/05/2021 12.9 12.0 - 15.0 g/dL Final  30/86/5784 69.6 11.1 - 15.9 g/dL Final         Passed - Cr in normal range and within 360 days    Creatinine  Date Value Ref Range Status  08/05/2014 0.86 mg/dL Final    Comment:    2.95-2.84 NOTE: New Reference Range  07/03/14    Creatinine, Ser  Date Value Ref Range Status  02/05/2021 0.74 0.44 - 1.00 mg/dL Final         Passed - Patient is not pregnant      Passed - Valid encounter within last 12 months    Recent Outpatient Visits          3 months ago Essential hypertension   [facility] [place], [name], DO   4 months ago 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 lisinopril (ZESTRIL) 40 MG tablet [Pharmacy Med Name: LISINOPRIL 40 MG TAB] 90 tablet 0    Sig: TAKE 1 TABLET BY MOUTH ONCE DAILY     Cardiovascular:  ACE Inhibitors Failed - 04/04/2021  6:11 PM      Failed - Last BP in normal range    BP Readings from Last 1 Encounters:  02/05/21 (!) 152/81          Passed - Cr in normal range and within 180 days    Creatinine  Date Value Ref Range Status  08/05/2014 0.86 mg/dL Final    Comment:    1.32-4.40 NOTE: New Reference Range  07/03/14    Creatinine, Ser  Date Value Ref Range Status  02/05/2021 0.74 0.44 - 1.00 mg/dL Final         Passed - K in normal range and within 180 days    Potassium  Date Value Ref Range Status  02/05/2021 3.8 3.5 - 5.1 mmol/L Final  08/05/2014 4.5 mmol/L Final    Comment:    3.5-5.1 NOTE: New Reference Range  07/03/14          Passed - Patient is not pregnant      Passed - Valid encounter within last 6 months    Recent Outpatient Visits          3 months ago Essential hypertension   [facility] [place], [name], DO   4 months ago Diabetic ulcer of right heel  associated with diabetes mellitus due to underlying condition, limited to breakdown of skin (HCC)   [facility] [name], NP   4 months ago Chronic venous insufficiency   [facility] [place], [name], NP   5 months ago Chronic obstructive pulmonary disease, unspecified COPD type (HCC)   [facility] [name], NP   6 months ago Insect bite of right foot, initial encounter   [facility] [name], NP      Future Appointments            In 2 months [name], NP [facility], [facility]           . amLODipine (NORVASC) 5 MG tablet [Pharmacy Med Name: AMLODIPINE BESYLATE 5 MG TAB] 90 tablet 0    Sig: TAKE 1 TABLET BY MOUTH ONCE DAILY     Cardiovascular:  Calcium Channel Blockers Failed - 04/04/2021  6:11 PM      Failed - Last BP in normal range    BP Readings from Last 1 Encounters:  02/05/21 (!) 152/81         Passed - Valid encounter within last 6 months    Recent Outpatient Visits          3 months ago Essential hypertension   [facility] [place], [name], DO   4 months ago Diabetic ulcer of right heel associated  with diabetes mellitus due to underlying condition, limited to breakdown of skin [facility])   [facility] [name], NP   4 months ago Chronic venous insufficiency   [facility] [place], [name], NP   5 months ago Chronic obstructive pulmonary disease, unspecified COPD type (HCC)   [facility] [name], NP   6 months ago Insect bite of right foot, initial encounter   [facility] [name], NP      Future Appointments            In 2 months [name], NP [facility], [facility]           . Vitamin D, Ergocalciferol, (DRISDOL) 1.25 MG (50000 UNIT) CAPS capsule [Pharmacy Med Name: VITAMIN D (ERGOCALCIFEROL) 1.25 MG] 12 capsule     Sig: TAKE 1 CAPSULE BY MOUTH EVERY 7 DAYS     Endocrinology:  Vitamins - Vitamin D Supplementation Failed - 04/04/2021  6:11 PM      Failed - 50,000 IU strengths are not delegated      Failed - Phosphate in normal range and within 360 days    No results found for: PHOS       Failed - Vitamin D in normal range and within 360 days    Vit D, 25-Hydroxy  Date Value Ref Range Status  10/30/2020 16.7 (L) 30.0 - 100.0 ng/mL Final    Comment:    Vitamin D deficiency has been defined by the Institute of Medicine and an Endocrine Society practice guideline as a level of serum 25-OH vitamin D less than 20 ng/mL (1,2). The Endocrine Society went on to further define vitamin D insufficiency as a level between 21 and 29 ng/mL (2). 1. IOM (Institute of Medicine). 2010. Dietary reference    intakes for calcium and D. [place]: The    [employer]. 2. Holick MF, Binkley [place], Bischoff-Ferrari HA, et al.    Evaluation, treatment, and prevention of vitamin D    deficiency: an Endocrine Society clinical practice    guideline. JCEM. 2011 Jul;  96(7):1911-30.          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, limited to breakdown of skin [facility])   [facility] [name], NP   4 months ago Chronic venous insufficiency   [facility] [place], [name], NP   5 months ago Chronic obstructive pulmonary disease, unspecified COPD type (HCC)   [facility] [name], NP   6 months ago Insect bite of right foot, initial encounter    [facility] [name], NP      Future Appointments            In 2 months [name], NP [facility], [facility]           . TRULICITY 0.75 MG/0.5ML SOPN [Pharmacy Med Name: TRULICITY 0.75 MG/0.5ML SUBQ SOLN M] 6 mL 1    Sig: INJECT 0.75MG  SUBQ ONCE A WEEK     Endocrinology:  Diabetes - GLP-1 Receptor Agonists Passed - 04/04/2021  6:11 PM      Passed - HBA1C is between 0 and 7.9 and within 180 days    HB A1C (BAYER DCA - WAIVED)  Date Value Ref Range Status  04/03/2020 5.5 <7.0 % Final    Comment:                                          Diabetic Adult            <  7.0                                       Healthy Adult        4.3 - 5.7                                                           (DCCT/NGSP) American Diabetes Association's Summary of Glycemic Recommendations for Adults with Diabetes: Hemoglobin A1c <7.0%. More stringent glycemic goals (A1c <6.0%) may further reduce complications at the cost of increased risk of hypoglycemia.    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

## 2021-04-05 NOTE — Telephone Encounter (Signed)
Requested medication (s) are due for refill today: yes  Requested medication (s) are on the active medication list: yes  Last refill:  10/30/20  Future visit scheduled: yes  Notes to clinic:  med not delegated to NT to RF   Requested Prescriptions  Pending Prescriptions Disp Refills   Vitamin D, Ergocalciferol, (DRISDOL) 1.25 MG (50000 UNIT) CAPS capsule [Pharmacy Med Name: VITAMIN D (ERGOCALCIFEROL) 1.25 MG] 12 capsule     Sig: TAKE 1 CAPSULE BY MOUTH EVERY 7 DAYS     Endocrinology:  Vitamins - Vitamin D Supplementation Failed - 04/04/2021  6:11 PM      Failed - 50,000 IU strengths are not delegated      Failed - Phosphate in normal range and within 360 days    No results found for: PHOS        Failed - Vitamin D in normal range and within 360 days    Vit D, 25-Hydroxy  Date Value Ref Range Status  10/30/2020 16.7 (L) 30.0 - 100.0 ng/mL Final    Comment:    Vitamin D deficiency has been defined by the Institute of Medicine and an Endocrine Society practice guideline as a level of serum 25-OH vitamin D less than 20 ng/mL (1,2). The Endocrine Society went on to further define vitamin D insufficiency as a level between 21 and 29 ng/mL (2). 1. IOM (Institute of Medicine). 2010. Dietary reference    intakes for calcium and D. [place]: The    [employer]. 2. Holick MF, Binkley [place], Bischoff-Ferrari HA, et al.    Evaluation, treatment, and prevention of vitamin D    deficiency: an Endocrine Society clinical practice    guideline. JCEM. 2011 Jul; 96(7):1911-30.           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, limited to breakdown of skin [facility])   [facility] [name], NP   4 months ago Chronic venous insufficiency   [facility] [place], [name], NP   5 months ago Chronic obstructive pulmonary disease, unspecified COPD type (HCC)   [facility] [name], NP   6 months ago Insect bite of right foot, initial encounter   [facility] [name], NP       Future Appointments             In 2 months [name], NP [facility], [facility]            Signed Prescriptions Disp Refills   meloxicam (MOBIC) 15 MG tablet 90 tablet 0    Sig: TAKE 1 TABLET BY MOUTH ONCE DAILY AS NEEDED WITH FOOD OR MILK     Analgesics:  COX2 Inhibitors Passed - 04/04/2021  6:11 PM      Passed - HGB in normal range and  within 360 days    Hemoglobin  Date Value Ref Range Status  02/05/2021 12.9 12.0 - 15.0 g/dL Final  16/01/9603 54.0 11.1 - 15.9 g/dL Final          Passed - Cr in normal range and within 360 days    Creatinine  Date Value Ref Range Status  08/05/2014 0.86 mg/dL Final    Comment:    9.81-1.91 NOTE: New Reference Range  07/03/14    Creatinine, Ser  Date Value Ref Range Status  02/05/2021 0.74 0.44 - 1.00 mg/dL Final          Passed - Patient is not pregnant      Passed - Valid encounter within last 12 months    Recent Outpatient Visits           3 months ago Essential hypertension   [facility] [place], [name], DO   4 months ago 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, limited to breakdown of skin (HCC)   [facility] [name], NP   4 months ago Chronic venous insufficiency   [facility] [place], [name], NP   5 months  ago Chronic obstructive pulmonary disease, unspecified COPD type (HCC)   [facility] [name], NP   6 months ago Insect bite of right foot, initial encounter   [facility] [name], NP       Future Appointments             In 2 months [name], NP [facility], [facility]             lisinopril (ZESTRIL) 40 MG tablet 90 tablet 0    Sig: TAKE 1 TABLET BY MOUTH ONCE DAILY     Cardiovascular:  ACE Inhibitors Failed - 04/04/2021  6:11 PM      Failed - Last BP in normal range    BP Readings from Last 1 Encounters:  02/05/21 (!) 152/81          Passed - Cr in normal range and within 180 days    Creatinine  Date Value Ref Range Status  08/05/2014 0.86 mg/dL Final    Comment:    4.78-2.95 NOTE: New Reference Range  07/03/14    Creatinine, Ser  Date Value Ref Range Status  02/05/2021 0.74 0.44 - 1.00 mg/dL Final          Passed - K in normal range and within 180 days    Potassium  Date Value Ref Range Status  02/05/2021 3.8 3.5 - 5.1 mmol/L Final  08/05/2014 4.5 mmol/L Final    Comment:    3.5-5.1 NOTE: New Reference Range  07/03/14           Passed - Patient is not pregnant      Passed - Valid encounter within last 6 months    Recent Outpatient Visits           3 months ago Essential hypertension   [facility] [place], [name], DO   4 months ago 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, limited to breakdown of skin [facility])   [facility] [name], NP   4 months ago Chronic venous insufficiency   [facility] [place], [name], NP   5 months ago Chronic obstructive pulmonary disease, unspecified COPD type (HCC)   [facility] [name], NP   6 months ago  Insect bite of right foot, initial encounter   [facility] [name], NP       Future Appointments              In 2 months [name], NP [facility], [facility]             amLODipine (NORVASC) 5 MG tablet 90 tablet 0    Sig: TAKE 1 TABLET BY MOUTH ONCE DAILY     Cardiovascular:  Calcium Channel Blockers Failed - 04/04/2021  6:11 PM      Failed - Last BP in normal range    BP Readings from Last 1 Encounters:  02/05/21 (!) 152/81          Passed - Valid encounter within last 6 months    Recent Outpatient Visits           3 months ago Essential hypertension   [facility] [place], [name], DO   4 months ago 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, limited to breakdown of skin (HCC)   [facility] [name], NP   4 months ago Chronic venous insufficiency   [facility] [place], [name], NP   5 months ago Chronic obstructive pulmonary disease, unspecified COPD type (HCC)   [facility] [name], NP   6 months ago Insect bite of right foot, initial encounter   [facility] [name], NP       Future Appointments             In 2 months [name], NP [facility], [facility]             citalopram (CELEXA) 20 MG tablet 90 tablet 0    Sig: TAKE 1 TABLET BY MOUTH ONCE DAILY     Psychiatry:  Antidepressants - SSRI Passed - 04/04/2021  6:11 PM      Passed - Completed PHQ-2 or PHQ-9 in the last 360 days      Passed - Valid encounter within last 6 months    Recent Outpatient Visits           3 months ago Essential hypertension   [facility] [place], [name], DO   4 months ago 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, limited to breakdown of skin (HCC)   [facility] [name], NP   4 months ago Chronic venous insufficiency   [facility] [place], [name], NP   5 months ago Chronic obstructive pulmonary disease,  unspecified COPD type (HCC)   [facility] [name], NP   6 months ago Insect bite of right foot, initial encounter   [facility] [name], NP       Future Appointments             In 2 months [name], NP [facility], [facility]             TRULICITY 0.75 MG/0.5ML SOPN 6 mL 1    Sig: INJECT 0.75MG  SUBQ ONCE A WEEK     Endocrinology:  Diabetes - GLP-1 Receptor Agonists Passed - 04/04/2021  6:11 PM      Passed - HBA1C is between 0 and 7.9 and within 180 days    HB A1C (BAYER DCA - WAIVED)  Date Value Ref Range Status  04/03/2020 5.5 <7.0 % Final    Comment:  Diabetic Adult            <7.0                                       Healthy Adult        4.3 - 5.7                                                           (DCCT/NGSP) American Diabetes Association's Summary of Glycemic Recommendations for Adults with Diabetes: Hemoglobin A1c <7.0%. More stringent glycemic goals (A1c <6.0%) may further reduce complications at the cost of increased risk of hypoglycemia.    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, limited to breakdown of skin [facility])   [facility] [name], NP   4 months ago Chronic venous insufficiency   [facility] [place], [name], NP   5 months ago Chronic obstructive pulmonary disease, unspecified COPD type (HCC)   [facility] [name], NP   6 months ago  Insect bite of right foot, initial encounter   [facility] [name], NP       Future Appointments             In 2 months [name], NP [facility], [facility]

## 2021-04-07 ENCOUNTER — Other Ambulatory Visit: Payer: Self-pay | Admitting: Obstetrics and Gynecology

## 2021-04-07 ENCOUNTER — Other Ambulatory Visit: Payer: Self-pay

## 2021-04-07 DIAGNOSIS — I10 Essential (primary) hypertension: Secondary | ICD-10-CM

## 2021-04-10 ENCOUNTER — Other Ambulatory Visit: Payer: Self-pay | Admitting: Licensed Clinical Social Worker

## 2021-04-10 NOTE — Patient Instructions (Signed)
Visit Information  Cheryl Chandler was given information about Medicaid Managed Care team care coordination services as a part of their [facility] Medicaid benefit. Cheryl Chandler verbally consented to engagement with the [facility] Managed Care team but does not express any further social work needs at this current time.    If you are experiencing a medical emergency, please call 911 or report to your local emergency department or urgent care.   If you have a non-emergency medical problem during routine business hours, please contact your provider's office and ask to speak with a nurse.   For questions related to your [facility] health plan, please call: [phone] or go here:[email or web address]  If you would like to schedule transportation through your [facility] plan, please call the following number at least 2 days in advance of your appointment: [phone].  Call the [facility] Crisis Line at [phone], at any time, 24 hours a day, 7 days a week. If you are in danger or need immediate medical attention call 911.  If you would like help to quit smoking, call 1-800-QUIT-NOW ((972)105-7198) OR Espaol: 1-855-Djelo-Ya (1-751-025-8527) o para ms informacin haga clic aqu or Text READY to 782-423 to register via text   Following is a copy of your plan of care:  Care Plan : General Social Work (Adult)  Updates made by Gustavus Bryant, LCSW since 04/10/2021 12:00 AM     Problem: Coping Skills (General Plan of Care)      Long-Range Goal: Coping Skills Enhanced   Start Date: 11/07/2020  Expected End Date: 04/10/2021  Recent Progress: On track  Priority: High  Note:   Timeframe:  Long-Range Goal Priority:  High Start Date:       11/07/20                      Expected End Date:  04/10/21  Current Barriers:  Financial constraints Mental Health Concerns  Social Isolation Limited education about mental health support resources that are available  to her within the local area* Cognitive Deficits Lacks knowledge of community resource  Clinical Social Work Clinical Goal(s):  Over the next 90 days, client will work with SW to address concerns related to experiencing ongoing symptoms of depression, sadness and grief since the passing of her mother Over the next 90 days, patient will work with LCSW to address needs related to implementing appropriate self-care and depression management.   Managing Loss, Adult People experience loss in many different ways throughout their lives. Events such as moving, changing jobs, and losing friends can create a sense of loss. The loss may be as serious as a major health change, divorce, death of a pet, or death of a loved one. All of these types of loss are likely to create a physical and emotional reaction known as grief. Grief is the result of a major change or an absence of something or someone that you count on. Grief is a normal reaction to loss. A variety of factors can affect your grieving experience, including: The nature of your loss. Your relationship to what or whom you lost. Your understanding of grief and how to manage it. Your support system. How to manage lifestyle changes Keep to your normal routine as much as possible. If you have trouble focusing or doing normal activities, it is acceptable to take some time away from your normal routine. Spend time with friends and loved ones. Eat a healthy diet, get plenty of sleep, and rest when  you feel tired. How to recognize changes  The way that you deal with your grief will affect your ability to function as you normally do. When grieving, you may experience these changes: Numbness, shock, sadness, anxiety, anger, denial, and guilt. Thoughts about death. Unexpected crying. A physical sensation of emptiness in your stomach. Problems sleeping and eating. Tiredness (fatigue). Loss of interest in normal activities. Dreaming about or imagining  seeing the person who died. A need to remember what or whom you lost. Difficulty thinking about anything other than your loss for a period of time. Relief. If you have been expecting the loss for a while, you may feel a sense of relief when it happens. Follow these instructions at home:    Activity Express your feelings in healthy ways, such as: Talking with others about your loss. It may be helpful to find others who have had a similar loss, such as a support group. Writing down your feelings in a journal. Doing physical activities to release stress and emotional energy. Doing creative activities like painting, sculpting, or playing or listening to music. Practicing resilience. This is the ability to recover and adjust after facing challenges. Reading some resources that encourage resilience may help you to learn ways to practice those behaviors. General instructions Be patient with yourself and others. Allow the grieving process to happen, and remember that grieving takes time. It is likely that you may never feel completely done with some grief. You may find a way to move on while still cherishing memories and feelings about your loss. Accepting your loss is a process. It can take months or longer to adjust. Keep all follow-up visits as told by your health care provider. This is important. Where to find support To get support for managing loss: Ask your health care provider for help and recommendations, such as grief counseling or therapy. Think about joining a support group for people who are managing a loss. Where to find more information You can find more information about managing loss from: American Society of Clinical Oncology: www.cancer.net American Psychological Association: DiceTournament.ca Contact a health care provider if: Your grief is extreme and keeps getting worse. You have ongoing grief that does not improve. Your body shows symptoms of grief, such as illness. You feel  depressed, anxious, or lonely. Get help right away if: You have thoughts about hurting yourself or others. If you ever feel like you may hurt yourself or others, or have thoughts about taking your own life, get help right away. You can go to your nearest emergency department or call: Your local emergency services (911 in the [place] A suicide crisis helpline, such as the National Suicide Prevention Lifeline at 619-750-2413. This is open 24 hours a day. Summary Grief is the result of a major change or an absence of someone or something that you count on. Grief is a normal reaction to loss. The depth of grief and the period of recovery depend on the type of loss and your ability to adjust to the change and process your feelings. Processing grief requires patience and a willingness to accept your feelings and talk about your loss with people who are supportive. It is important to find resources that work for you and to realize that people experience grief differently. There is not one grieving process that works for everyone in the same way. Be aware that when grief becomes extreme, it can lead to more severe issues like isolation, depression, anxiety, or suicidal thoughts. Talk with your  health care provider if you have any of these issues. This information is not intended to replace advice given to you by your health care provider. Make sure you discuss any questions you have with your health care provider. Document Revised: 06/17/2018 Document Reviewed: 08/27/2016 Elsevier Patient Education  2020 [employer].  Help for Managing Grief   When you are experiencing grief, many resources and support are available to help you. You are not alone.    Grief Share (https://www.SunglassSpecialist.gl):    [employer] of [address] [place], [place]      [facility]  [phone] [address] [place], [place]     [facility] [address] [place], [place]       [facility] [address] [place], [place]  [phone]   [facility] [address] [place], [place] [phone]   [facility] [address] [place], [place]     [phone]   [facility] [address] [place], [place]     [phone]   If a Hospice or Palliative Care team has been involved in the care of your loved one, you may reach out to your contact there or locally, you may reach out to [facility]:    [facility]   [address] [place], [place] [address] [phone] Patient was educated on healthy self-care as she admits she has little motivation to walk lately. PCS recommendation was suggested.   Patient Self Care Activities:  Attends all scheduled provider appointments Calls provider office for new concerns or questions   Please see past updates related to this goal by clicking on the "Past Updates" button in the selected goal   [name], BSW, MSW, [employer] Managed Medicaid LCSW [facility]   [facility] [email or web address] Phone: [phone]

## 2021-04-10 NOTE — Patient Outreach (Signed)
°Medicaid Managed Care °Social Work Note ° °04/10/2021 °Name:  Cheryl Chandler MRN:  [id number] DOB:  07/19/1965 ° °Cheryl Chandler is an 55 y.o. year old female who is a primary patient of Holdsworth, Karen, NP.  The Medicaid Managed Care Coordination team was consulted for assistance with:  Mental Health Counseling and Resources ° °Ms. Streett was given information about Medicaid Managed Care Coordination team services today. Cheryl Chandler Patient agreed to services and verbal consent obtained. ° °Engaged with patient  for by telephone for discharge visit  in response to referral for case management and/or care coordination services.  ° °Assessments/Interventions:  Review of past medical history, allergies, medications, health status, including review of consultants reports, laboratory and other test data, was performed as part of comprehensive evaluation and provision of chronic care management services. ° °SDOH: (Social Determinant of Health) assessments and interventions performed: °[name]   ° °Flowsheet Row Most Recent Value  °[name]   °Stress Interventions Provide Counseling, Offered Community Wellness Resources  ° °  ° ° °Advanced Directives Status:  See Care Plan for related entries. ° °Care Plan °                °Allergies  °Allergen Reactions  ° Codeine Shortness Of Breath and Rash  ° Percocet [Oxycodone-Acetaminophen] Shortness Of Breath  ° Sulfa Antibiotics Shortness Of Breath and Rash  ° Aspirin Hives  ° Bee Venom   °  Bee stings  ° Darvon [Propoxyphene]   °  Darvocet-N 100  ° Latex   ° Oxycodone Nausea And Vomiting  ° Penicillins   °  Has patient had a PCN reaction causing immediate rash, facial/tongue/throat swelling, SOB or lightheadedness with hypotension: Unknown °Has patient had a PCN reaction causing severe rash involving mucus membranes or skin necrosis: Unknown °Has patient had a PCN reaction that required hospitalization: Unknown °Has patient had a  PCN reaction occurring within the last 10 years: Unknown °If all of the above answers are "NO", then may proceed with Cephalosporin use.  ° ° °Medications Reviewed Today   ° ° Reviewed by Craft, Terri G, RN (Registered Nurse) on 03/24/21 at 1030  Med List Status: <None>  ° °Medication Order Taking? Sig Documenting Provider Last Dose Status Informant  °ACCU-CHEK GUIDE test strip [id number] No  [provider] Taking Active   °Accu-Chek Softclix Lancets lancets [id number] No CHECK BLOOD SUGAR TWICE DAILY Holdsworth, Karen, NP Taking Active   °albuterol (PROVENTIL) (2.5 MG/3ML) 0.083% nebulizer solution [id number] No INHALE CONTENTS OF 1 VIAL IN NEBULIZER EVERY 4 HOURS AS NEEDED Holdsworth, Karen, NP Taking Active   °amLODipine (NORVASC) 5 MG tablet [id number]  Take 2 tablets (10 mg total) by mouth daily. Johnson, [name], DO  Active   °atorvastatin (LIPITOR) 80 MG tablet [id number] No Take 1 tablet (80 mg total) by mouth daily. Holdsworth, Karen, NP Taking Active   °Blood Glucose Monitoring Suppl (ACCU-CHEK GUIDE) w/Device KIT [id number] No Use to check blood sugar 3 times a day. [name], [name], NP Taking Active   °carvedilol (COREG) 3.125 MG tablet [id number] No Take 1 tablet (3.125 mg total) by mouth 2 (two) times daily with a meal. Rumball, Alison M, DO Taking Active   °citalopram (CELEXA) 20 MG tablet [id number] No TAKE 1 TABLET BY MOUTH ONCE DAILY Holdsworth, Karen, NP Taking Active   °EPINEPHrine 0.3 mg/0.3 mL IJ SOAJ injection [id number] No USE AS DIRECTED. Holdsworth, Karen, NP Taking Active   °furosemide (LASIX) 20 MG tablet [id number]    TAKE 1 TABLET BY MOUTH ONCE DAILY IN AFTERNOON AS NEEDED LEG EDEMA Holdsworth, Karen, NP  Active   °furosemide (LASIX) 40 MG tablet [id number] No Take 1 tablet (40 mg total) by mouth daily. Lane, [name], PA-C Taking Active   °         °Med Note (KENNEDY, NATHAN K   Mon Oct 14, 2020  1:41 PM) Told to take in AM, not afternoon  °gabapentin (NEURONTIN) 300 MG capsule  [id number]  TAKE 1 CAPSULE BY MOUTH 3 TIMES DAILY ASNEEDED (NEED OFFICE VISIT FOR FURTHER REFILLS) Holdsworth, Karen, NP  Active   °levothyroxine (SYNTHROID) 50 MCG tablet [id number] No TAKE 1 TABLET BY MOUTH ONCE DAILY ON AN EMPTY STOMACH. WAIT 30 MINUTES BEFORE TAKING OTHER MEDS. Holdsworth, Karen, NP Taking Active   °lisinopril (ZESTRIL) 40 MG tablet [id number] No TAKE 1 TABLET BY MOUTH ONCE DAILY Holdsworth, Karen, NP Taking Active   °meloxicam (MOBIC) 15 MG tablet [id number]  TAKE 1 TABLET BY MOUTH ONCE DAILY AS NEEDED WITH FOOD OR MILK Holdsworth, Karen, NP  Active   °methocarbamol (ROBAXIN) 750 MG tablet [id number] No TAKE 1-2 TABLETS BY MOUTH 3 TIMES DAILY AS NEEDED [name], [name], NP Taking Active   °mometasone-formoterol (DULERA) 100-5 MCG/ACT AERO [id number] No INHALE 2 PUFFS BY MOUTH TWICE A DAY. RINSE MOUTH AFTER USE. Lane, [name], PA-C Taking Active   °montelukast (SINGULAIR) 10 MG tablet [id number] No Take 1 tablet (10 mg total) by mouth at bedtime. Holdsworth, Karen, NP Taking Active   °nitroGLYCERIN (NITROSTAT) 0.4 MG SL tablet [id number]  DISSOLVE 1 TABLET UNDER TONGUE AT ONSET OF CHEST PAIN. REPEAT IN 5 MIN IF NOT RESOLVED, MAX 3 DOSES. 911 IF NEEDED. Holdsworth, Karen, NP  Active   °omeprazole (PRILOSEC) 20 MG capsule [id number] No Take 1 capsule (20 mg total) by mouth daily. Holdsworth, Karen, NP Taking Active   °QUEtiapine (SEROQUEL) 25 MG tablet [id number] No TAKE 1 TABLET BY MOUTH AT BEDTIME Holdsworth, Karen, NP Taking Active   °SPIRIVA HANDIHALER 18 MCG inhalation capsule [id number]  INHALE CONTENTS OF 1 CAPSULE ONCE DAILY Holdsworth, Karen, NP  Active   °triamcinolone cream (KENALOG) 0.1 % [id number] No APPLY 1 APPLICATION TOPICALLY TWICE DAILY [name], [name], NP Taking Active   °TRULICITY 0.75 MG/0.5ML SOPN [id number] No INJECT 0.75MG SUBQ ONCE A WEEK Holdsworth, Karen, NP Taking Active   °Vitamin D, Ergocalciferol, (DRISDOL) 1.25 MG (50000 UNIT) CAPS capsule [id number] No TAKE 1  CAPSULE BY MOUTH ONCE EVERY 7 DAYS Holdsworth, Karen, NP Taking Active   ° °  °  ° °  ° ° °Patient Active Problem List  ° Diagnosis Date Noted  ° Fibrocystic disease of both breasts 05/07/2019  ° IFG (impaired fasting glucose) 05/03/2019  ° Chronic venous insufficiency 11/09/2018  ° Overactive bladder 09/30/2018  ° Peripheral neuropathy 09/30/2018  ° Apnea 01/02/2016  ° Lymphedema 11/30/2014  ° Dyspnea on exertion 11/30/2014  ° Anxiety 11/29/2014  ° Arthritis 11/29/2014  ° COPD (chronic obstructive pulmonary disease) (HCC) 11/29/2014  ° Clinical depression 11/29/2014  ° H/O eating disorder 11/29/2014  ° Esophageal reflux 11/29/2014  ° Acne inversa 11/29/2014  ° Essential hypertension 11/29/2014  ° HLD (hyperlipidemia) 11/29/2014  ° Adult hypothyroidism 11/29/2014  ° Insomnia 11/29/2014  ° Low back pain 11/29/2014  ° Morbid obesity (HCC) 11/29/2014  ° Posttraumatic stress disorder 11/29/2014  ° Vitamin D deficiency 11/29/2014  ° Nicotine dependence, cigarettes, uncomplicated 11/29/2014  ° ° °Conditions to be addressed/monitored per PCP order:  Depression ° °Care   Plan : General Social Work (Adult)  °Updates made by Divina Neale L, LCSW since 04/10/2021 12:00 AM  °  ° °Problem: Coping Skills (General Plan of Care)   °  ° °Long-Range Goal: Coping Skills Enhanced   °Start Date: 11/07/2020  °Expected End Date: 04/10/2021  °Recent Progress: On track  °Priority: High  °Note:   °Timeframe:  Long-Range Goal °Priority:  High °Start Date:       11/07/20                      °Expected End Date:  04/10/21 ° °Current Barriers:  °Financial constraints °Mental Health Concerns  °Social Isolation °Limited education about mental health support resources that are available to her within the local area* °Cognitive Deficits °Lacks knowledge of community resource ° °Clinical Social Work Clinical Goal(s):  °Over the next 90 days, client will work with SW to address concerns related to experiencing ongoing symptoms of depression, sadness and  grief since the passing of her mother °Over the next 90 days, patient will work with LCSW to address needs related to implementing appropriate self-care and depression management.  ° °Interventions: °Patient interviewed and appropriate assessments performed °Provided mental health counseling with regards to patient's loss and ongoing stressors. Patient was very close with her mother who passed away a few years ago and she continues to experience symptoms of grief from this loss. Patient denies wanting LCSW to make a referral for grief counseling through [facility] again but was receptive to coping skill and resource education/information that was provided during session today. Patient reports decorating for the holidays helps her to connect with her mother as they did this together when she was alive.  °Provided patient with information about managing depression within her daily life. Patient has ongoing grief symptoms as well. Patient states "I feel worn down." Patient reports that she talks out loud to her mother's picture and this is very helpful to her. Grief support resource education provided again during session today.  °Provided reflective listening and implemented appropriate interventions to help suppport patient and her emotional needs  °Discussed plans with patient for ongoing care management follow up and provided patient with direct contact information for [facility] team °Advised patient to contact [facility] team with any urgent case management needs. °Assisted patient/caregiver with obtaining information about health plan benefits °LCSW completed past referral for family to have a wheelchair ramp built at their residence due to ongoing mobility concerns. Ramp has been successfully installed.  °PCS actively involved with patient and providing ongoing services.  °Positive reinforcement provided to patient for quitting smoking cigarettes. Coping skill review education provided for future triggers.  °Brief self-care  education provided to both patient and spouse. °Patient confirms that she continues to go to food pantry for food insecurity. Resource information has been provided.  °Patient reports ongoing pain and issues with mobility. Patient shares that her pain resides in her pains and legs. Emotional support and coping skill education provided. °Patient confirms that her PCS aide continues to provide her with services. However, aide has to be reminded to put on her mask since patient is high risk. °LCSW reviewed resources that [name] provided her with in the past.  °Patient reports that she had a wonderful birthday spent with her family this past May. Patient reports receiving appropriate socialization. Patient reports that her spouse took her out to eat and gifted her with several things which was a nice surprise.  °Patient confirms stable transportation arrangements   arrangements for upcoming PCP appointment this week.  Patient repots that she is losing a lot of weight but is not sure how much she has lost as she does not have a scale. Patient reports that a goal of hers is to lose weight but she has not been intentionally losing weight at this time.  Patient reports that her aide [name] continues to assist her with PCS and is an excellent support within the home for her and spouse.  Patient planted a tomato plant and successfully grew over 5 tomatoes over the summer. Patient is very proud of this achievement as this was her first time planting a tomato plant and is now interested in picking up gardening/planting as a hobby. Patient reports that her aunt continues to be a positive support for her in her life and someone that she can rely on in the case of emergencies.  Patient was educated on healthy self-care skills and advised her to use these into her daily routine.  04/10/21- Update- Patient is agreeable to social work closure at this time as she still does not need medication management or counseling as she is managing  her mental health well at this time. Patient talks to her younger sister everyday to gain emotional support. Patient says that her sister is into body building and helps to educate and remind her to do her own self-care. Patient is agreeable to contact [facility] LCSW directly if any social work concern arise. [facility] LCSW will complete case closure at this time.  Patient reports that she has been elevating her legs daily to elevate pain and swelling.  [facility] LCSW completed care coordination with Thomasene Lot on 01/15/21 and [facility] LCSW provided the following suggested resource-It is likely their best option given their loss of housing and lack of consistent income:  Publishing rights manager                                                                                                                                                                              (Emergency [place]) [phone] Ext. 104 M-F, 8am - 4pm Patient was successfully approved for food stamps but is still experiencing financial concerns. She reports that she and her husband have no clothes that fit them and they are having to start using a string for their pants and patient only has one bra at this time. [facility] LCSW will send referral to [facility] BSW today for financial support.  BSW spoke with patient about resources needed. Patient states she does receive SSI and was approved for foodstamps. Patient is familiar with all of the food pantries and other resources in [place]. BSW will assist patient with finding an eye doctor.  states she has not been in 10 years. BSW will research and put a list of eye doctors together that accept Medicaid in [place]. °Update- Patient reports experiencing additional grief lately. [facility] LCSW offered referral for berievement counseling but she declined. Managing Loss, Adult °People experience loss in many different ways throughout their lives. Events such as moving, changing jobs, and  losing friends can create a sense of loss. The loss may be as serious as a major health change, divorce, death of a pet, or death of a loved one. All of these types of loss are likely to create a physical and emotional reaction known as grief. Grief is the result of a major change or an absence of something or someone that you count on. Grief is a normal reaction to loss. °A variety of factors can affect your grieving experience, including: °The nature of your loss. °Your relationship to what or whom you lost. °Your understanding of grief and how to manage it. °Your support system. °How to manage lifestyle changes °Keep to your normal routine as much as possible. °If you have trouble focusing or doing normal activities, it is acceptable to take some time away from your normal routine. °Spend time with friends and loved ones. °Eat a healthy diet, get plenty of sleep, and rest when you feel tired. °How to recognize changes  °The way that you deal with your grief will affect your ability to function as you normally do. When grieving, you may experience these changes: °Numbness, shock, sadness, anxiety, anger, denial, and guilt. °Thoughts about death. °Unexpected crying. °A physical sensation of emptiness in your stomach. °Problems sleeping and eating. °Tiredness (fatigue). °Loss of interest in normal activities. °Dreaming about or imagining seeing the person who died. °A need to remember what or whom you lost. °Difficulty thinking about anything other than your loss for a period of time. °Relief. If you have been expecting the loss for a while, you may feel a sense of relief when it happens. °Follow these instructions at home: ° °  °Activity °Express your feelings in healthy ways, such as: °Talking with others about your loss. It may be helpful to find others who have had a similar loss, such as a support group. °Writing down your feelings in a journal. °Doing physical activities to release stress and emotional  energy. °Doing creative activities like painting, sculpting, or playing or listening to music. °Practicing resilience. This is the ability to recover and adjust after facing challenges. Reading some resources that encourage resilience may help you to learn ways to practice those behaviors. °General instructions °Be patient with yourself and others. Allow the grieving process to happen, and remember that grieving takes time. °It is likely that you may never feel completely done with some grief. You may find a way to move on while still cherishing memories and feelings about your loss. °Accepting your loss is a process. It can take months or longer to adjust. °Keep all follow-up visits as told by your health care provider. This is important. °Where to find support °To get support for managing loss: °Ask your health care provider for help and recommendations, such as grief counseling or therapy. °Think about joining a support group for people who are managing a loss. °Where to find more information °You can find more information about managing loss from: °American Society of Clinical Oncology: www.cancer.net °American Psychological Association: www.apa.org °Contact a health care provider if: °Your grief is extreme and keeps getting worse. °You have ongoing grief   that does not improve. °Your body shows symptoms of grief, such as illness. °You feel depressed, anxious, or lonely. °Get help right away if: °You have thoughts about hurting yourself or others. °If you ever feel like you may hurt yourself or others, or have thoughts about taking your own life, get help right away. You can go to your nearest emergency department or call: °Your local emergency services (911 in the [place] °A suicide crisis helpline, such as the National Suicide Prevention Lifeline at 1-800-273-8255. This is open 24 hours a day. °Summary °Grief is the result of a major change or an absence of someone or something that you count on. Grief is a  normal reaction to loss. °The depth of grief and the period of recovery depend on the type of loss and your ability to adjust to the change and process your feelings. °Processing grief requires patience and a willingness to accept your feelings and talk about your loss with people who are supportive. °It is important to find resources that work for you and to realize that people experience grief differently. There is not one grieving process that works for everyone in the same way. °Be aware that when grief becomes extreme, it can lead to more severe issues like isolation, depression, anxiety, or suicidal thoughts. Talk with your health care provider if you have any of these issues. °This information is not intended to replace advice given to you by your health care provider. Make sure you discuss any questions you have with your health care provider. °Document Revised: 06/17/2018 Document Reviewed: 08/27/2016 °Elsevier Patient Education © 2020 Elsevier Inc. ° °Help for Managing Grief °  °When you are experiencing grief, many resources and support are available to help you. You are not alone.  °  °Grief Share (https://www.griefshare.org/):  °  °[facility] °[address] °[place], [place]  °  °  °[facility]  °[address] °[place], [place] °  °  °[facility] °[address] °[place], [place]  °  °  °[facility] °[address] °[place], [place]  °[phone] °  °[facility] °[address] °[place], [place] °[phone] °  °[facility] °[address] °[place], [place]     °[phone] °  °[facility] °[address] °[place], [place]     °[phone] °  °If a Hospice or Palliative Care team has been involved in the care of your loved one, you may reach out to your contact there or locally, you may reach out to [facility]:  °  °[facility] °  °[address] °[place],  [address] °[phone] °Patient was educated on healthy self-care as she admits she has little motivation to walk lately. PCS recommendation was suggested.  ° °Patient Self Care Activities:  °Attends all scheduled provider appointments °Calls provider office for new concerns or questions ° ° °Please see past updates related to this goal by clicking on the "Past Updates" button in the selected goal  ° ° °  ° ° °Follow up:  Patient requests no follow-up at this time. ° °Plan: The Managed Medicaid care management team is available to follow up with the patient after provider conversation with the patient regarding recommendation for care management engagement and subsequent re-referral to the care management team.  ° ° , BSW, MSW, LCSW °Managed Medicaid LCSW °[place]     [email or web address] Phone: [phone]

## 2021-04-13 DIAGNOSIS — R3981 Functional urinary incontinence: Secondary | ICD-10-CM | POA: Diagnosis not present

## 2021-04-13 DIAGNOSIS — Z993 Dependence on wheelchair: Secondary | ICD-10-CM | POA: Diagnosis not present

## 2021-04-15 ENCOUNTER — Telehealth: Payer: Self-pay

## 2021-04-15 NOTE — Telephone Encounter (Signed)
° °  Telephone encounter was:  Unsuccessful.  04/15/2021 Name: Cheryl Chandler MRN: [id number] DOB: [birth year]  Unsuccessful outbound call made today to assist with:   Insurance  Outreach Attempt:  1st Attempt  A HIPAA compliant voice message was left requesting a return call.  Instructed patient to call back at [phone] at their earliest convenience .  [facility] Guide, Embedded Care Coordination [facility]  [place], [place] [address]  Main Phone: [phone]   E-mail: [email or web address]  Website: [email or web address]

## 2021-04-18 ENCOUNTER — Telehealth: Payer: Self-pay

## 2021-04-18 NOTE — Telephone Encounter (Signed)
° °  Telephone encounter was:  Successful.  04/18/2021 Name: [name] MRN: [id number] DOB: 05/14/1965  Cheryl Chandler is a 55 y.o. year old female who is a primary care patient of [name], NP . The community resource team was consulted for assistance with  dermatologist search.  Care guide performed the following interventions:  Spoke to pt about contacting Medicaid for her to see what dermatologist are in her network as I was unable to find them on the [facility] website. Pt then advised that she wants to talk to PCP a bit more aboout her concerns. Pt and I both agreed that would be beneficial. Pt will contact PCP further dermatology questions . Once pt speaks to PCP she will then return my call if any further assistance is needed. At this time, pt would like to hold off until she sees or speaks to PCP.  Follow Up Plan:  No further follow up planned at this time. The patient has been provided with needed resources. Patient will contact me back if and when needed.  [facility] Guide, Embedded Care Coordination [facility]  [place], [place] [address]  Main Phone: [phone]   E-mail: [email or web address]  Website: [email or web address]

## 2021-04-23 ENCOUNTER — Other Ambulatory Visit: Payer: Self-pay

## 2021-04-23 ENCOUNTER — Other Ambulatory Visit: Payer: Self-pay | Admitting: Obstetrics and Gynecology

## 2021-04-23 NOTE — Patient Instructions (Signed)
Visit Information  Cheryl Chandler was given information about Medicaid Managed Care team care coordination services as a part of their [facility] Medicaid benefit. Cheryl Chandler verbally consented to engagement with the [facility] Managed Care team.   If you are experiencing a medical emergency, please call 911 or report to your local emergency department or urgent care.   If you have a non-emergency medical problem during routine business hours, please contact your provider's office and ask to speak with a nurse.   For questions related to your [facility] health plan, please call: [phone] or go here:[email or web address]  If you would like to schedule transportation through your [facility] plan, please call the following number at least 2 days in advance of your appointment: [phone].  Call the [facility] at [phone], at any time, 24 hours a day, 7 days a week. If you are in danger or need immediate medical attention call 911.  If you would like help to quit smoking, call 1-800-QUIT-NOW 601-525-5850) OR Espaol: 1-855-Djelo-Ya (2-081-388-7195) o para ms informacin haga clic aqu or Text READY to 200-400 to register via text  Cheryl Chandler - following are the goals we discussed in your visit today:   Goals Addressed               This Visit's Progress     RNCM: Keep Skin Clean and Dry        Follow Up Date: 05/24/21  - clean and dry skin well - keep skin dry - use a fragrance-free lotion on skin - wear a protective pad or garment    Why is this important?   Leaking urine (pee) can cause soreness from skin rashes and redness.  This is caused by skin being exposed to urine (pee).    04/23/21: no change per patient      RNCM: Track and Manage My Symptoms-Depression        Timeframe:  Long-Range Goal Priority:  High Start Date:    12-02-2020                         Expected End Date:   ongoing                Follow  Up Date: 05/24/21   - avoid negative self-talk - develop a personal safety plan - develop a plan to deal with triggers like holidays, anniversaries - exercise at least 2 to 3 times per week - have a plan for how to handle bad days - journal feelings and what helps to feel better or worse - spend time or talk with others at least 2 to 3 times per week - spend time or talk with others every day - watch for early signs of feeling worse    Why is this important?   Keeping track of your progress will help your treatment team find the right mix of medicine and therapy for you.  Write in your journal every day.  Day-to-day changes in depression symptoms are normal. It may be more helpful to check your progress at the end of each week instead of every day.     Notes:04/23/21:  patient declined SW services.      RNCM: Track and Manage My Triggers-COPD        Timeframe:  Long-Range Goal Priority:  Medium Start Date:     05-22-2020  Expected End Date:   ongoing          Follow Up Date: 05/24/21   - avoid second hand smoke - eliminate smoking in my home - identify and avoid work-related triggers - identify and remove indoor air pollutants - limit outdoor activity during cold weather - listen for public air quality announcements every day    Why is this important?   Triggers are activities or things, like tobacco smoke or cold weather, that make your COPD (chronic obstructive pulmonary disease) flare-up.  Knowing these triggers helps you plan how to stay away from them.  When you cannot remove them, you can learn how to manage them.     04/23/21:  Patient smoking 1 pack a week, going to try to stop on her own.  SOB is chronic-oxygen saturations 95%                 The patient verbalized understanding of instructions provided today and declined a print copy of patient instruction materials.   The Managed Medicaid care management team will reach out to the patient  again over the next 30 days.  The  Patient   has been provided with contact information for the Managed Medicaid care management team and has been advised to call with any health related questions or concerns.   Aida Raider RN, BSN [place]   [facility] [employer] - Managed Medicaid High Risk [phone].    Following is a copy of your plan of care:  Care Plan : RNCM: COPD (Adult)  Updates made by [name], RN since 04/23/2021 12:00 AM     Problem: RNCM: Psychological Adjustment to Diagnosis (COPD)   Priority: Medium  Onset Date: 05/22/2020     Long-Range Goal: RNCM: Adjustment to Disease Achieved   Start Date: 05/22/2020  Expected End Date: 03/27/2022  Recent Progress: On track  Priority: High  Note:   Current Barriers:  Knowledge deficits related to basic understanding of COPD disease process Knowledge deficits related to basic COPD self care/management Knowledge deficit related to basic understanding of how to use inhalers and how inhaled medications work Knowledge deficit related to importance of energy conservation Limited Social Support Unable to independently manage COPD Lacks social connections Does not contact provider office for questions/concerns  Case Manager Clinical Goal(s): patient will report using inhalers as prescribed including rinsing mouth after use patient will be able to verbalize understanding of COPD action plan and when to seek appropriate levels of medical care  patient will engage in lite exercise as tolerated to build/regain stamina and strength and reduce shortness of breath through activity tolerance  patient will verbalize basic understanding of COPD disease process and self care activities  Interventions:  Collaboration with [name], NP regarding development and update of comprehensive plan of care as evidenced by provider attestation and co-signature Inter-disciplinary care team collaboration  (see longitudinal plan of care) UNABLE to independently: manage COPD Provided patient with basic written and verbal COPD education on self care/management/and exacerbation prevention 04/23/21:  No change in SOB, Oxygen saturation 95%, Follow up appointments scheduled. Provided patient with COPD action plan and reinforced importance of daily self assessment.  Provided written and verbal instructions on pursed lip breathing and utilized returned demonstration as teach back Provided instruction about proper use of medications used for management of COPD including inhalers Advised patient to self assesses COPD action plan zone and make appointment with provider if in the yellow zone for 48  hours without improvement. Provided patient with education about the role of exercise in the management of COPD Advised patient to engage in light exercise as tolerated 3-5 days a week Provided education about and advised patient to utilize infection prevention strategies to reduce risk of respiratory infection. Patient Goals/Self-Care Activities:  - decision-making supported - depression screen reviewed - emotional support provided - family involvement promoted - problem-solving facilitated - relaxation techniques promoted - verbalization of feelings encouraged Follow Up Plan: Telephone follow up appointment with care management team member scheduled.     Problem: RNCM: Symptom Exacerbation (COPD)   Priority: High  Onset Date: 05/22/2020     Long-Range Goal: RNCM: Smoking Cessation Symptom Exacerbation Prevented or Minimized   Start Date: 05/22/2020  Expected End Date: 03/27/2022  Recent Progress: Not on track  Priority: High  Note:   Current Barriers:  Unable to independently stop smoking  Does not adhere to provider recommendations re: smoking cessation  Lacks social connections Does not contact provider office for questions/concerns Tobacco abuse of >40 years; currently smoking 1 pack every 2  weeks Previous quit attempts, unsuccessful several successful using patches, gum- has a pattern of stopping and then starting back  Reports smoking within 30 minutes of waking up Reports triggers to smoke include: stress, chronic conditions  Reports motivation to quit smoking includes: knows it will help her breathing and other chronic conditions  On a scale of 1-10, reports MOTIVATION to quit is 8 On a scale of 1-10, reports CONFIDENCE in quitting is 7 04/23/21:  patient states she is smoking 1 pack a week and trying to cut back on her own-is aware of resources. Clinical Goal(s):  patient will work with RN Case Engineer, civil (consulting) and provider towards tobacco cessation  Interventions: Collaboration with [name], NP regarding development and update of comprehensive plan of care as evidenced by provider attestation and co-signature Inter-disciplinary care team collaboration (see longitudinal plan of care) Evaluation of current treatment plan reviewed.  Provided contact information for [place] [facility] (1-800-QUIT-NOW). Patient will outreach this group for support. Update 01/22/21:  Patient states she has utilized [employer] for resources. Discussed plans with patient for ongoing care management follow up and provided patient with direct contact information for care management team Provided patient with printed smoking cessation educational materials Provided contact information for [place] [facility]uit [facility] (1-800-QUIT-NOW). Patient will outreach this group for support. Patient Goals/Self-Care Activities patient will:  - mindfulness, talking to family and friends as a habit during cravings  - verbally commit to reducing tobacco consumption - barriers to lifestyle changes reviewed and addressed - barriers to treatment reviewed and addressed - breathing techniques encouraged - modification of home and work environment promoted - rescue (action) plan reviewed - signs/symptoms of  infection reviewed - signs/symptoms of worsening disease assessed - treatment plan reviewed Follow Up Plan: Telephone follow up appointment with care management team member scheduled.     Care Plan : RNCM: Hypertension (Adult)  Updates made by [name], RN since 04/23/2021 12:00 AM     Problem: RNCM: Hypertension (Hypertension)   Priority: Medium  Onset Date: 05/22/2020     Long-Range Goal: RNCM: Hypertension Monitored   Start Date: 05/22/2020  Expected End Date: 03/27/2022  Recent Progress: On track  Priority: Medium  Note:   Objective:  Last practice recorded BP readings:  BP Readings from Last 3 Encounters:  11/20/20 (!) 154/68  11/20/20 113/60  10/30/20 126/67    Most recent eGFR/CrCl:  No results found for: EGFR  No components found for: CRCL Current Barriers:  Knowledge Deficits related to basic understanding of hypertension pathophysiology and self care management Knowledge Deficits related to understanding of medications prescribed for management of hypertension Limited Social Support Unable to independently manage HTN Lacks social connections Does not contact provider office for questions/concerns B/P [phone] per patient-instructed patient to call provider for appointment. Case Manager Clinical Goal(s):   patient will verbalize understanding of plan for hypertension management patient will demonstrate improved adherence to prescribed treatment plan for hypertension as evidenced by taking all medications as prescribed, monitoring and recording blood pressure as directed, adhering to low sodium/DASH diet patient will demonstrate improved health management independence as evidenced by checking blood pressure as directed and notifying PCP if SBP>160 or DBP > 90, taking all medications as prescribe, and adhering to a low sodium diet as discussed. Interventions:  Collaboration with [name], NP regarding development and update of comprehensive plan of care  as evidenced by provider attestation and co-signature Inter-disciplinary care team collaboration (see longitudinal plan of care) Evaluation of current treatment plan related to hypertension self management and patient's adherence to plan as established by provider.  Provided education to patient re: stroke prevention, s/s of heart attack and stroke, DASH diet, complications of uncontrolled blood pressure Reviewed medications with patient and discussed importance of compliance.  Discussed plans with patient for ongoing care management follow up and provided patient with direct contact information for care management team Advised patient, providing education and rationale, to monitor blood pressure daily and record, calling PCP for findings outside established parameters.  Patient Goals/Self-Care Activities Over the next 120 days, patient will:  - Self administers medications as prescribed Attends all scheduled provider appointments Calls provider office for new concerns, questions, or BP outside discussed parameters Checks BP and records as discussed Follows a low sodium diet/DASH diet - blood pressure trends reviewed - depression screen reviewed - home or ambulatory blood pressure monitoring encouraged Follow Up Plan: Telephone follow up appointment with care management team member scheduled.  Transitioning to [facility] team. Will follow up with them at scheduled time.     Care Plan : RNCM: Depression (Adult)  Updates made by [name], RN since 04/23/2021 12:00 AM     Problem: RNCM: Symptoms (Depression)   Priority: High  Onset Date: 12/02/2020     Long-Range Goal: RNCM: Symptoms Monitored and Managed   Start Date: 12/02/2020  Expected End Date: 03/27/2022  Recent Progress: Not on track  Priority: High  Note:   Current Barriers:  Ineffective Self Health Maintenance in a patient with Anxiety and Depression Unable to independently manage depression and anxiety as evidence of multiple  chronic conditions and the patient feeling her health is out of control  Does not attend all scheduled provider appointments Lacks social connections Unable to perform IADLs independently Does not contact provider office for questions/concerns 04/23/21:  Patient declines SW services at this time. Clinical Goal(s):  Collaboration with [name], NP regarding development and update of comprehensive plan of care as evidenced by provider attestation and co-signature Inter-disciplinary care team collaboration (see longitudinal plan of care) patient will work with care management team to address care coordination and chronic disease management needs related to Disease Management Educational Needs Care Coordination Mental Health Counseling Level of Care Concerns   Interventions:  Evaluation of current treatment plan related to Anxiety and Depression, Financial constraints related to insurance not approving care for the patient to go to wound  care , Limited social support, Level of care concerns, ADL IADL limitations, Mental Health Concerns , Family and relationship dysfunction, Substance abuse issues -  smoker , and Inability to perform IADL's independently self-management and patient's adherence to plan as established by provider. Collaboration with [name], NP regarding development and update of comprehensive plan of care as evidenced by provider attestation       and co-signature Inter-disciplinary care team collaboration (see longitudinal plan of care) Discussed plans with patient for ongoing care management follow up and provided patient with direct contact information for care management team Evaluation of depression and anxiety.   Self Care Activities:  Patient verbalizes understanding of plan to effectively manage depression and anxiety Self administers medications as prescribed Attends all scheduled provider appointments Calls pharmacy for medication refills Attends  church or other social activities Performs ADL's independently Performs IADL's independently Calls provider office for new concerns or questions Work with the [facility] team to meet needs for continued CCM services  Patient Goals: - activity or exercise based on tolerance encouraged - depression screen reviewed - emotional support provided - healthy lifestyle promoted - medication side effects monitored and managed - pain managed - participation in mental health treatment encouraged - quality of sleep assessed - response to mental health treatment monitored - response to pharmacologic therapy monitored - sleep hygiene techniques encouraged - social activities and relationships encouraged - substance use assessed Follow Up Plan: The care management team will reach out to the patient again over the next 30 to 60 days.

## 2021-04-23 NOTE — Patient Outreach (Signed)
Medicaid Managed Care   Nurse Care Manager Note  04/23/2021 Name:  Cheryl Chandler MRN:  [id number] DOB:  04-03-1966  Cheryl Chandler is an 55 y.o. year old female who is a primary patient of [name], NP.  The [facility] Managed Care Coordination team was consulted for assistance with:    Chronic healthcare management needs.  [name] was given information about Medicaid Managed Care Coordination team services today. Gargatha Patient agreed to services and verbal consent obtained.  Engaged with patient by telephone for follow up visit in response to provider referral for case management and/or care coordination services.   Assessments/Interventions:  Review of past medical history, allergies, medications, health status, including review of consultants reports, laboratory and other test data, was performed as part of comprehensive evaluation and provision of chronic care management services.  SDOH (Social Determinants of Health) assessments and interventions performed: [name]    Flowsheet Row Most Recent Value  [name]   Housing Interventions Intervention Not Indicated  Intimate Partner Violence Interventions Intervention Not Indicated       Care Plan  Allergies  Allergen Reactions   Codeine Shortness Of Breath and Rash   Percocet [Oxycodone-Acetaminophen] Shortness Of Breath   Sulfa Antibiotics Shortness Of Breath and Rash   Aspirin Hives   Bee Venom     Bee stings   Darvon [Propoxyphene]     Darvocet-N 100   Latex    Oxycodone Nausea And Vomiting   Penicillins     Has patient had a PCN reaction causing immediate rash, facial/tongue/throat swelling, SOB or lightheadedness with hypotension: Unknown Has patient had a PCN reaction causing severe rash involving mucus membranes or skin necrosis: Unknown Has patient had a PCN reaction that required hospitalization: Unknown Has patient had a PCN reaction occurring within  the last 10 years: Unknown If all of the above answers are "NO", then may proceed with Cephalosporin use.    Medications Reviewed Today     Reviewed by [name], RN (Registered Nurse) on 03/24/21 at 80  Med List Status: <None>   Medication Order Taking? Sig Documenting Provider Last Dose Status Informant  ACCU-CHEK GUIDE test strip [id number] No  [provider] Taking Active   Accu-Chek Softclix Lancets lancets [id number] No CHECK BLOOD SUGAR TWICE DAILY [name], NP Taking Active   albuterol (PROVENTIL) (2.5 MG/3ML) 0.083% nebulizer solution [id number] No INHALE CONTENTS OF 1 VIAL IN NEBULIZER EVERY 4 HOURS AS NEEDED [name], NP Taking Active   amLODipine (NORVASC) 5 MG tablet [id number]  Take 2 tablets (10 mg total) by mouth daily. Johnson, [name], DO  Active   atorvastatin (LIPITOR) 80 MG tablet [id number] No Take 1 tablet (80 mg total) by mouth daily. [name], NP Taking Active   Blood Glucose Monitoring Suppl (ACCU-CHEK GUIDE) w/Device KIT [id number] No Use to check blood sugar 3 times a day. [name], NP Taking Active   carvedilol (COREG) 3.125 MG tablet [id number] No Take 1 tablet (3.125 mg total) by mouth 2 (two) times daily with a meal. [name], DO Taking Active   citalopram (CELEXA) 20 MG tablet [id number] No TAKE 1 TABLET BY MOUTH ONCE DAILY [name], NP Taking Active   EPINEPHrine 0.3 mg/0.3 mL IJ SOAJ injection [id number] No USE AS DIRECTED. [name], NP Taking Active   furosemide (LASIX) 20 MG tablet [id number]  TAKE 1 TABLET BY MOUTH ONCE DAILY IN AFTERNOON AS NEEDED LEG EDEMA [name], NP  Active   furosemide (LASIX) 40 MG tablet [id number] No Take 1 tablet (40 mg total) by mouth daily. [name], PA-C Taking Active            Med Note [name] Oct 14, 2020  1:41 PM) Told to take in AM, not afternoon  gabapentin (NEURONTIN) 300 MG capsule [id number]  TAKE 1 CAPSULE BY  MOUTH 3 TIMES DAILY ASNEEDED (NEED OFFICE VISIT FOR FURTHER REFILLS) [name], NP  Active   levothyroxine (SYNTHROID) 50 MCG tablet [id number] No TAKE 1 TABLET BY MOUTH ONCE DAILY ON AN EMPTY STOMACH. WAIT 30 MINUTES BEFORE TAKING OTHER MEDS. [name], NP Taking Active   lisinopril (ZESTRIL) 40 MG tablet [id number] No TAKE 1 TABLET BY MOUTH ONCE DAILY [name], NP Taking Active   meloxicam (MOBIC) 15 MG tablet [id number]  TAKE 1 TABLET BY MOUTH ONCE DAILY AS NEEDED WITH FOOD OR MILK [name], NP  Active   methocarbamol (ROBAXIN) 750 MG tablet [id number] No TAKE 1-2 TABLETS BY MOUTH 3 TIMES DAILY AS NEEDED [name], [name], NP Taking Active   mometasone-formoterol (DULERA) 100-5 MCG/ACT AERO [id number] No INHALE 2 PUFFS BY MOUTH TWICE A DAY. RINSE MOUTH AFTER USE. [name], PA-C Taking Active   montelukast (SINGULAIR) 10 MG tablet [id number] No Take 1 tablet (10 mg total) by mouth at bedtime. [name], NP Taking Active   nitroGLYCERIN (NITROSTAT) 0.4 MG SL tablet [id number]  DISSOLVE 1 TABLET UNDER TONGUE AT ONSET OF CHEST PAIN. REPEAT IN 5 MIN IF NOT RESOLVED, MAX 3 DOSES. 911 IF NEEDED. [name], NP  Active   omeprazole (PRILOSEC) 20 MG capsule [id number] No Take 1 capsule (20 mg total) by mouth daily. [name], NP Taking Active   QUEtiapine (SEROQUEL) 25 MG tablet [id number] No TAKE 1 TABLET BY MOUTH AT BEDTIME [name], NP Taking Active   SPIRIVA HANDIHALER 18 MCG inhalation capsule [id number]  INHALE CONTENTS OF 1 CAPSULE ONCE DAILY [name], NP  Active   triamcinolone cream (KENALOG) 0.1 % [id number] No APPLY 1 APPLICATION TOPICALLY TWICE DAILY [name], NP Taking Active   TRULICITY 1.54 YO/0.54YO SOPN [id number] No INJECT 0.75MG SUBQ ONCE A WEEK [name], NP Taking Active   Vitamin D, Ergocalciferol, (DRISDOL) 1.25 MG (50000 UNIT) CAPS capsule [id number] No TAKE 1 CAPSULE BY MOUTH ONCE EVERY 7 DAYS  [name], NP Taking Active             Patient Active Problem List   Diagnosis Date Noted   Fibrocystic disease of both breasts 05/07/2019   IFG (impaired fasting glucose) 05/03/2019   Chronic venous insufficiency 11/09/2018   Overactive bladder 09/30/2018   Peripheral neuropathy 09/30/2018   Apnea 01/02/2016   Lymphedema 11/30/2014   Dyspnea on exertion 11/30/2014   Anxiety 11/29/2014   Arthritis 11/29/2014   COPD (chronic obstructive pulmonary disease) ([place]) 11/29/2014   Clinical depression 11/29/2014   H/O eating disorder 11/29/2014   Esophageal reflux 11/29/2014   Acne inversa 11/29/2014   Essential hypertension 11/29/2014   HLD (hyperlipidemia) 11/29/2014   Adult hypothyroidism 11/29/2014   Insomnia 11/29/2014   Low back pain 11/29/2014   Morbid obesity ([place]) 11/29/2014   Posttraumatic stress disorder 11/29/2014   Vitamin D deficiency 11/29/2014   Nicotine dependence, cigarettes, uncomplicated 34/35/6861    Conditions to be addressed/monitored per PCP order:   chronic healthcare management needs,  HTN, LBP, HLD, insomnia, COPD, overactive bladder  Care Plan : RNCM: COPD (Adult)  Updates made by [name], RN since 04/23/2021 12:00 AM     Problem: RNCM: Psychological Adjustment to Diagnosis (COPD)   Priority: Medium  Onset Date: 05/22/2020     Long-Range Goal: RNCM: Adjustment to Disease Achieved   Start Date: 05/22/2020  Expected End Date: 03/27/2022  Recent Progress: On track  Priority: High  Note:   Current Barriers:  Knowledge deficits related to basic understanding of COPD disease process Knowledge deficits related to basic COPD self care/management Knowledge deficit related to basic understanding of how to use inhalers and how inhaled medications work Knowledge deficit related to importance of energy conservation Limited Social Support Unable to independently manage COPD Lacks social connections Does not contact provider office for  questions/concerns  Case Manager Clinical Goal(s): patient will report using inhalers as prescribed including rinsing mouth after use patient will be able to verbalize understanding of COPD action plan and when to seek appropriate levels of medical care  patient will engage in lite exercise as tolerated to build/regain stamina and strength and reduce shortness of breath through activity tolerance  patient will verbalize basic understanding of COPD disease process and self care activities  Interventions:  Collaboration with [name], NP regarding development and update of comprehensive plan of care as evidenced by provider attestation and co-signature Inter-disciplinary care team collaboration (see longitudinal plan of care) UNABLE to independently: manage COPD Provided patient with basic written and verbal COPD education on self care/management/and exacerbation prevention 04/23/21:  No change in SOB, Oxygen saturation 95%, Follow up appointments scheduled. Provided patient with COPD action plan and reinforced importance of daily self assessment.  Provided written and verbal instructions on pursed lip breathing and utilized returned demonstration as teach back Provided instruction about proper use of medications used for management of COPD including inhalers Advised patient to self assesses COPD action plan zone and make appointment with provider if in the yellow zone for 48 hours without improvement. Provided patient with education about the role of exercise in the management of COPD Advised patient to engage in light exercise as tolerated 3-5 days a week Provided education about and advised patient to utilize infection prevention strategies to reduce risk of respiratory infection. Patient Goals/Self-Care Activities:  - decision-making supported - depression screen reviewed - emotional support provided - family involvement promoted - problem-solving facilitated - relaxation  techniques promoted - verbalization of feelings encouraged Follow Up Plan: Telephone follow up appointment with care management team member scheduled.     Problem: RNCM: Symptom Exacerbation (COPD)   Priority: High  Onset Date: 05/22/2020     Long-Range Goal: RNCM: Smoking Cessation Symptom Exacerbation Prevented or Minimized   Start Date: 05/22/2020  Expected End Date: 03/27/2022  Recent Progress: Not on track  Priority: High  Note:   Current Barriers:  Unable to independently stop smoking  Does not adhere to provider recommendations re: smoking cessation  Lacks social connections Does not contact provider office for questions/concerns Tobacco abuse of >40 years; currently smoking 1 pack every 2 weeks Previous quit attempts, unsuccessful several successful using patches, gum- has a pattern of stopping and then starting back  Reports smoking within 30 minutes of waking up Reports triggers to smoke include: stress, chronic conditions  Reports motivation to quit smoking includes: knows it will help her breathing and other chronic conditions  On a scale of 1-10, reports MOTIVATION to quit is 8 On a scale of 1-10, reports CONFIDENCE in quitting is 7 04/23/21:  patient states she is smoking 1  pack a week and trying to cut back on her own-is aware of resources. Clinical Goal(s):  patient will work with RN Case Engineer, civil (consulting) and provider towards tobacco cessation  Interventions: Collaboration with [name], NP regarding development and update of comprehensive plan of care as evidenced by provider attestation and co-signature Inter-disciplinary care team collaboration (see longitudinal plan of care) Evaluation of current treatment plan reviewed.  Provided contact information for [place] [facility] (1-800-QUIT-NOW). Patient will outreach this group for support. Update 01/22/21:  Patient states she has utilized [employer] for resources. Discussed plans with  patient for ongoing care management follow up and provided patient with direct contact information for care management team Provided patient with printed smoking cessation educational materials Provided contact information for [place] [facility] (1-800-QUIT-NOW). Patient will outreach this group for support. Patient Goals/Self-Care Activities patient will:  - mindfulness, talking to family and friends as a habit during cravings  - verbally commit to reducing tobacco consumption - barriers to lifestyle changes reviewed and addressed - barriers to treatment reviewed and addressed - breathing techniques encouraged - modification of home and work environment promoted - rescue (action) plan reviewed - signs/symptoms of infection reviewed - signs/symptoms of worsening disease assessed - treatment plan reviewed Follow Up Plan: Telephone follow up appointment with care management team member scheduled.     Care Plan : RNCM: Hypertension (Adult)  Updates made by [name], RN since 04/23/2021 12:00 AM     Problem: RNCM: Hypertension (Hypertension)   Priority: Medium  Onset Date: 05/22/2020     Long-Range Goal: RNCM: Hypertension Monitored   Start Date: 05/22/2020  Expected End Date: 03/27/2022  Recent Progress: On track  Priority: Medium  Note:   Objective:  Last practice recorded BP readings:  BP Readings from Last 3 Encounters:  11/20/20 (!) 154/68  11/20/20 113/60  10/30/20 126/67    Most recent eGFR/CrCl: No results found for: EGFR  No components found for: CRCL Current Barriers:  Knowledge Deficits related to basic understanding of hypertension pathophysiology and self care management Knowledge Deficits related to understanding of medications prescribed for management of hypertension Limited Social Support Unable to independently manage HTN Lacks social connections Does not contact provider office for questions/concerns B/P [phone]/90 per patient-instructed patient to call  provider for appointment. Case Manager Clinical Goal(s):   patient will verbalize understanding of plan for hypertension management patient will demonstrate improved adherence to prescribed treatment plan for hypertension as evidenced by taking all medications as prescribed, monitoring and recording blood pressure as directed, adhering to low sodium/DASH diet patient will demonstrate improved health management independence as evidenced by checking blood pressure as directed and notifying PCP if SBP>160 or DBP > 90, taking all medications as prescribe, and adhering to a low sodium diet as discussed. Interventions:  Collaboration with [name], NP regarding development and update of comprehensive plan of care as evidenced by provider attestation and co-signature Inter-disciplinary care team collaboration (see longitudinal plan of care) Evaluation of current treatment plan related to hypertension self management and patient's adherence to plan as established by provider.  Provided education to patient re: stroke prevention, s/s of heart attack and stroke, DASH diet, complications of uncontrolled blood pressure Reviewed medications with patient and discussed importance of compliance.  Discussed plans with patient for ongoing care management follow up and provided patient with direct contact information for care management team Advised patient, providing education and rationale, to monitor blood pressure daily and record, calling PCP  for findings outside established parameters.  Patient Goals/Self-Care Activities Over the next 120 days, patient will:  - Self administers medications as prescribed Attends all scheduled provider appointments Calls provider office for new concerns, questions, or BP outside discussed parameters Checks BP and records as discussed Follows a low sodium diet/DASH diet - blood pressure trends reviewed - depression screen reviewed - home or ambulatory blood pressure  monitoring encouraged Follow Up Plan: Telephone follow up appointment with care management team member scheduled.  Transitioning to [facility] team. Will follow up with them at scheduled time.     Care Plan : RNCM: Depression (Adult)  Updates made by [name], RN since 04/23/2021 12:00 AM     Problem: RNCM: Symptoms (Depression)   Priority: High  Onset Date: 12/02/2020     Long-Range Goal: RNCM: Symptoms Monitored and Managed   Start Date: 12/02/2020  Expected End Date: 03/27/2022  Recent Progress: Not on track  Priority: High  Note:   Current Barriers:  Ineffective Self Health Maintenance in a patient with Anxiety and Depression Unable to independently manage depression and anxiety as evidence of multiple chronic conditions and the patient feeling her health is out of control  Does not attend all scheduled provider appointments Lacks social connections Unable to perform IADLs independently Does not contact provider office for questions/concerns 04/23/21:  Patient declines SW services at this time. Clinical Goal(s):  Collaboration with [name], NP regarding development and update of comprehensive plan of care as evidenced by provider attestation and co-signature Inter-disciplinary care team collaboration (see longitudinal plan of care) patient will work with care management team to address care coordination and chronic disease management needs related to Disease Management Educational Needs Care Coordination Mental Health Counseling Level of Care Concerns   Interventions:  Evaluation of current treatment plan related to Anxiety and Depression, Financial constraints related to insurance not approving care for the patient to go to wound care , Limited social support, Level of care concerns, ADL IADL limitations, Mental Health Concerns , Family and relationship dysfunction, Substance abuse issues -  smoker , and Inability to perform IADL's independently self-management and  patient's adherence to plan as established by provider. Collaboration with [name], NP regarding development and update of comprehensive plan of care as evidenced by provider attestation       and co-signature Inter-disciplinary care team collaboration (see longitudinal plan of care) Discussed plans with patient for ongoing care management follow up and provided patient with direct contact information for care management team Evaluation of depression and anxiety.   Self Care Activities:  Patient verbalizes understanding of plan to effectively manage depression and anxiety Self administers medications as prescribed Attends all scheduled provider appointments Calls pharmacy for medication refills Attends church or other social activities Performs ADL's independently Performs IADL's independently Calls provider office for new concerns or questions Work with the [facility] team to meet needs for continued CCM services  Patient Goals: - activity or exercise based on tolerance encouraged - depression screen reviewed - emotional support provided - healthy lifestyle promoted - medication side effects monitored and managed - pain managed - participation in mental health treatment encouraged - quality of sleep assessed - response to mental health treatment monitored - response to pharmacologic therapy monitored - sleep hygiene techniques encouraged - social activities and relationships encouraged - substance use assessed Follow Up Plan: The care management team will reach out to the patient again over the next 30 to 60 days.     Follow Up:  Patient agrees to Care Plan and Follow-up.  Plan: The Managed Medicaid care management team will reach out to the patient again over the next 30 days. and The  Patient has been provided with contact information for the Managed Medicaid care management team and has been advised to call with any health related questions or concerns.  Date/time of next  scheduled RN care management/care coordination outreach:  05/23/21 at 0900

## 2021-04-27 DIAGNOSIS — Z419 Encounter for procedure for purposes other than remedying health state, unspecified: Secondary | ICD-10-CM | POA: Diagnosis not present

## 2021-04-30 ENCOUNTER — Other Ambulatory Visit: Payer: Self-pay

## 2021-04-30 NOTE — Patient Instructions (Signed)
Visit Information  [name] was given information about Medicaid Managed Care team care coordination services as a part of their [facility] Medicaid benefit. Cheryl Chandler verbally consented to engagement with the [facility] Managed Care team.   If you are experiencing a medical emergency, please call 911 or report to your local emergency department or urgent care.   If you have a non-emergency medical problem during routine business hours, please contact your provider's office and ask to speak with a nurse.   For questions related to your [facility] health plan, please call: [phone] or go here:[email or web address]  If you would like to schedule transportation through your [facility] plan, please call the following number at least 2 days in advance of your appointment: [phone].  Call the [facility] Crisis Line at [phone], at any time, 24 hours a day, 7 days a week. If you are in danger or need immediate medical attention call 911.  If you would like help to quit smoking, call 1-800-QUIT-NOW (386 867 8231) OR Espaol: 1-855-Djelo-Ya (2-297-989-2119) o para ms informacin haga clic aqu or Text READY to 417-408 to register via text  [name] - following are the goals we discussed in your visit today:   Goals Addressed   None      Social Worker will follow up in 14 days.   [name], BSW, [facility]   [place]  High Risk Managed Medicaid Team  [phone]   Following is a copy of your plan of care:  Care Plan : General Social Work (Adult)  Updates made by [name] since 04/30/2021 12:00 AM     Problem: Coping Skills (General Plan of Care)      Long-Range Goal: Coping Skills Enhanced   Start Date: 11/07/2020  Expected End Date: 04/10/2021  Recent Progress: On track  Priority: High  Note:   Timeframe:  Long-Range Goal Priority:  High Start Date:       11/07/20                       Expected End Date:  04/10/21  Current Barriers:  Financial constraints Mental Health Concerns  Social Isolation Limited education about mental health support resources that are available to her within the local area* Cognitive Deficits Lacks knowledge of community resource  Clinical Social Work Clinical Goal(s):  Over the next 90 days, client will work with SW to address concerns related to experiencing ongoing symptoms of depression, sadness and grief since the passing of her mother Over the next 90 days, patient will work with LCSW to address needs related to implementing appropriate self-care and depression management.   Interventions: Patient interviewed and appropriate assessments performed Provided mental health counseling with regards to patient's loss and ongoing stressors. Patient was very close with her mother who passed away a few years ago and she continues to experience symptoms of grief from this loss. Patient denies wanting LCSW to make a referral for grief counseling through [facility] again but was receptive to coping skill and resource education/information that was provided during session today. Patient reports decorating for the holidays helps her to connect with her mother as they did this together when she was alive.  Provided patient with information about managing depression within her daily life. Patient has ongoing grief symptoms as well. Patient states "I feel worn down." Patient reports that she talks out loud to her mother's picture and this is very helpful to her. Grief support resource  education provided again during session today.  Provided reflective listening and implemented appropriate interventions to help suppport patient and her emotional needs  Discussed plans with patient for ongoing care management follow up and provided patient with direct contact information for [facility] team Advised patient to contact [facility] team with any urgent case management  needs. Assisted patient/caregiver with obtaining information about health plan benefits LCSW completed past referral for family to have a wheelchair ramp built at their residence due to ongoing mobility concerns. Ramp has been successfully installed.  PCS actively involved with patient and providing ongoing services.  Positive reinforcement provided to patient for quitting smoking cigarettes. Coping skill review education provided for future triggers.  Brief self-care education provided to both patient and spouse. Patient confirms that she continues to go to food pantry for food insecurity. Resource information has been provided.  Patient reports ongoing pain and issues with mobility. Patient shares that her pain resides in her pains and legs. Emotional support and coping skill education provided. Patient confirms that her PCS aide continues to provide her with services. However, aide has to be reminded to put on her mask since patient is high risk. LCSW reviewed resources that [name] provided her with in the past.  Patient reports that she had a wonderful birthday spent with her family this past May. Patient reports receiving appropriate socialization. Patient reports that her spouse took her out to eat and gifted her with several things which was a nice surprise.  Patient confirms stable transportation arrangements for upcoming PCP appointment this week.  Patient repots that she is losing a lot of weight but is not sure how much she has lost as she does not have a scale. Patient reports that a goal of hers is to lose weight but she has not been intentionally losing weight at this time.  Patient reports that her aide [place] continues to assist her with PCS and is an excellent support within the home for her and spouse.  Patient planted a tomato plant and successfully grew over 5 tomatoes over the summer. Patient is very proud of this achievement as this was her first time planting a tomato plant  and is now interested in picking up gardening/planting as a hobby. Patient reports that her aunt continues to be a positive support for her in her life and someone that she can rely on in the case of emergencies.  Patient was educated on healthy self-care skills and advised her to use these into her daily routine.  04/10/21- Update- Patient is agreeable to social work closure at this time as she still does not need medication management or counseling as she is managing her mental health well at this time. Patient talks to her younger sister everyday to gain emotional support. Patient says that her sister is into body building and helps to educate and remind her to do her own self-care. Patient is agreeable to contact [facility] LCSW directly if any social work concern arise. [facility] LCSW will complete case closure at this time.  Patient reports that she has been elevating her legs daily to elevate pain and swelling.  [facility] LCSW completed care coordination with Osa Craver on 01/15/21 and [facility] LCSW provided the following suggested resource-It is likely their best option given their loss of housing and lack of consistent income:  Research scientist (physical sciences)                                                                                                                                                                              (  Emergency Crisis Housing) [phone] Ext. 104 M-F, 8am - 4pm Patient was successfully approved for food stamps but is still experiencing financial concerns. She reports that she and her husband have no clothes that fit them and they are having to start using a string for their pants and patient only has one bra at this time. [facility] LCSW will send [facility] to [facility] BSW today for financial support.  BSW spoke with patient about resources needed. Patient states she does receive SSI and was approved for foodstamps. Patient is familiar with all of the food pantries and other resources in  [place]. BSW will assist patient with finding an eye doctor. Patient states she has not been in 10 years. BSW will research and put a list of eye doctors together that accept Medicaid in [place]. 04/30/20: BSW contacted and spoke with patient, she stated she was not doing good because she found out that she has a new landlord and they are going up on her rent. BSW informed patient to make sure she informs her social workers at [employer] that they have an increase for foodstamps and Medicaid. Patient stated that she sleeps on the couch and is in need of a power recliner/lift chair. BSW will send patients PCP a message asking for a script to be sent. Update- Patient reports experiencing additional grief lately. [facility] LCSW offered referral for berievement counseling but she declined. Managing Loss, Adult People experience loss in many different ways throughout their lives. Events such as moving, changing jobs, and losing friends can create a sense of loss. The loss may be as serious as a major health change, divorce, death of a pet, or death of a loved one. All of these types of loss are likely to create a physical and emotional reaction known as grief. Grief is the result of a major change or an absence of something or someone that you count on. Grief is a normal reaction to loss. A variety of factors can affect your grieving experience, including: The nature of your loss. Your relationship to what or whom you lost. Your understanding of grief and how to manage it. Your support system. How to manage lifestyle changes Keep to your normal routine as much as possible. If you have trouble focusing or doing normal activities, it is acceptable to take some time away from your normal routine. Spend time with friends and loved ones. Eat a healthy diet, get plenty of sleep, and rest when you feel tired. How to recognize changes  The way that you deal with your grief will affect your ability to function as  you normally do. When grieving, you may experience these changes: Numbness, shock, sadness, anxiety, anger, denial, and guilt. Thoughts about death. Unexpected crying. A physical sensation of emptiness in your stomach. Problems sleeping and eating. Tiredness (fatigue). Loss of interest in normal activities. Dreaming about or imagining seeing the person who died. A need to remember what or whom you lost. Difficulty thinking about anything other than your loss for a period of time. Relief. If you have been expecting the loss for a while, you may feel a sense of relief when it happens. Follow these instructions at home:    Activity Express your feelings in healthy ways, such as: Talking with others about your loss. It may be helpful to find others who have had a similar loss, such as a support group. Writing down your feelings in a journal. Doing physical activities to release stress and emotional energy. Doing  creative activities like painting, sculpting, or playing or listening to music. Practicing resilience. This is the ability to recover and adjust after facing challenges. Reading some resources that encourage resilience may help you to learn ways to practice those behaviors. General instructions Be patient with yourself and others. Allow the grieving process to happen, and remember that grieving takes time. It is likely that you may never feel completely done with some grief. You may find a way to move on while still cherishing memories and feelings about your loss. Accepting your loss is a process. It can take months or longer to adjust. Keep all follow-up visits as told by your health care provider. This is important. Where to find support To get support for managing loss: Ask your health care provider for help and recommendations, such as grief counseling or therapy. Think about joining a support group for people who are managing a loss. Where to find more information You can  find more information about managing loss from: American Society of Clinical Oncology: www.cancer.net American Psychological Association: DiceTournament.cawww.apa.org Contact a health care provider if: Your grief is extreme and keeps getting worse. You have ongoing grief that does not improve. Your body shows symptoms of grief, such as illness. You feel depressed, anxious, or lonely. Get help right away if: You have thoughts about hurting yourself or others. If you ever feel like you may hurt yourself or others, or have thoughts about taking your own life, get help right away. You can go to your nearest emergency department or call: Your local emergency services (911 in the [place] A suicide crisis helpline, such as the National Suicide Prevention Lifeline at (640) 005-52161-416 798 3477. This is open 24 hours a day. Summary Grief is the result of a major change or an absence of someone or something that you count on. Grief is a normal reaction to loss. The depth of grief and the period of recovery depend on the type of loss and your ability to adjust to the change and process your feelings. Processing grief requires patience and a willingness to accept your feelings and talk about your loss with people who are supportive. It is important to find resources that work for you and to realize that people experience grief differently. There is not one grieving process that works for everyone in the same way. Be aware that when grief becomes extreme, it can lead to more severe issues like isolation, depression, anxiety, or suicidal thoughts. Talk with your health care provider if you have any of these issues. This information is not intended to replace advice given to you by your health care provider. Make sure you discuss any questions you have with your health care provider. Document Revised: 06/17/2018 Document Reviewed: 08/27/2016 Elsevier Patient Education  2020 [employer]  Help for Managing Grief   When you are  experiencing grief, many resources and support are available to help you. You are not alone.    Grief Share (https://www.SunglassSpecialist.glgriefshare.org/):    [employer] [facility] [address] [place], [place]      [employer] [facility] [facility]  [phone] [address] [place], [place]     [employer] [address] [place], [place]      [employer] [address] [place], [place]  [phone]   [facility] [address] [place], [place] [phone]   [facility] [address] [place], [place]     [phone]   [facility] [employer]  [address] [facility] [address] [place], [place]     [phone]   If a Hospice or Palliative Care team has been involved in the care of your loved one, you may reach out to your contact there or locally, you may reach out to [facility]:    [facility]   [address] [place], [place] [address] [phone] Patient was educated on healthy self-care as she admits she has little motivation to walk lately. PCS recommendation was suggested.   Patient Self Care Activities:  Attends all scheduled provider appointments Calls provider office for new concerns or questions   Please see past updates related to this goal by clicking on the "Past Updates" button in the selected goal

## 2021-04-30 NOTE — Patient Outreach (Signed)
Medicaid Managed Care Social Work Note  04/30/2021 Name:  Cheryl Chandler MRN:  [id number] DOB:  09-Apr-1966  Cheryl Chandler is an 56 y.o. year old female who is a primary Chandler of [name], NP.  The [facility] Managed Care Coordination team was consulted for assistance with:   lift chair  Cheryl Chandler was given information about Medicaid Managed Care Coordination team services today. Cheryl Chandler agreed to services and verbal consent obtained.  Engaged with Chandler  for by telephone forfollow up visit in response to referral for case management and/or care coordination services.   Assessments/Interventions:  Review of past medical history, allergies, medications, health status, including review of consultants reports, laboratory and other test data, was performed as part of comprehensive evaluation and provision of chronic care management services.  SDOH: (Social Determinant of Health) assessments and interventions performed: BSW contacted and spoke with Chandler, she stated she was not doing good because she found out that she has a new landlord and they are going up on her rent. BSW informed Chandler to make sure she informs her social workers at [employer] that they have an increase for foodstamps and Medicaid. Chandler stated that she sleeps on the couch and is in need of a power recliner/lift chair. BSW will send patients PCP a message asking for a script to be sent.   Advanced Directives Status:  Not addressed in this encounter.  Care Plan                 Allergies  Allergen Reactions   Codeine Shortness Of Breath and Rash   Percocet [Oxycodone-Acetaminophen] Shortness Of Breath   Sulfa Antibiotics Shortness Of Breath and Rash   Aspirin Hives   Bee Venom     Bee stings   Darvon [Propoxyphene]     Darvocet-N 100   Latex    Oxycodone Nausea And Vomiting   Penicillins     Has Chandler had a PCN reaction causing immediate rash, facial/tongue/throat  swelling, SOB or lightheadedness with hypotension: Unknown Has Chandler had a PCN reaction causing severe rash involving mucus membranes or skin necrosis: Unknown Has Chandler had a PCN reaction that required hospitalization: Unknown Has Chandler had a PCN reaction occurring within the last 10 years: Unknown If all of the above answers are "NO", then may proceed with Cephalosporin use.    Medications Reviewed Today     Reviewed by [name], RN (Registered Nurse) on 03/24/21 at 37  Med List Status: <None>   Medication Order Taking? Sig Documenting Provider Last Dose Status Informant  ACCU-CHEK GUIDE test strip [id number] No  [provider] Taking Active   Accu-Chek Softclix Lancets lancets [id number] No CHECK BLOOD SUGAR TWICE DAILY [name], NP Taking Active   albuterol (PROVENTIL) (2.5 MG/3ML) 0.083% nebulizer solution [id number] No INHALE CONTENTS OF 1 VIAL IN NEBULIZER EVERY 4 HOURS AS NEEDED [name], NP Taking Active   amLODipine (NORVASC) 5 MG tablet [id number]  Take 2 tablets (10 mg total) by mouth daily. Johnson, [name], DO  Active   atorvastatin (LIPITOR) 80 MG tablet [id number] No Take 1 tablet (80 mg total) by mouth daily. [name], NP Taking Active   Blood Glucose Monitoring Suppl (ACCU-CHEK GUIDE) w/Device KIT [id number] No Use to check blood sugar 3 times a day. [name], NP Taking Active   carvedilol (COREG) 3.125 MG tablet [id number] No Take 1 tablet (3.125 mg total) by mouth 2 (two) times daily with a meal. Rumball,  Jake Church, DO Taking Active   citalopram (CELEXA) 20 MG tablet [id number] No TAKE 1 TABLET BY MOUTH ONCE DAILY [name], NP Taking Active   EPINEPHrine 0.3 mg/0.3 mL IJ SOAJ injection [id number] No USE AS DIRECTED. [name], NP Taking Active   furosemide (LASIX) 20 MG tablet [id number]  TAKE 1 TABLET BY MOUTH ONCE DAILY IN AFTERNOON AS NEEDED LEG EDEMA [name], NP  Active   furosemide (LASIX) 40 MG  tablet [id number] No Take 1 tablet (40 mg total) by mouth daily. [name], PA-C Taking Active            Med Note [name] Oct 14, 2020  1:41 PM) Told to take in AM, not afternoon  gabapentin (NEURONTIN) 300 MG capsule [id number]  TAKE 1 CAPSULE BY MOUTH 3 TIMES DAILY ASNEEDED (NEED OFFICE VISIT FOR FURTHER REFILLS) [name], NP  Active   levothyroxine (SYNTHROID) 50 MCG tablet [id number] No TAKE 1 TABLET BY MOUTH ONCE DAILY ON AN EMPTY STOMACH. WAIT 30 MINUTES BEFORE TAKING OTHER MEDS. [name], NP Taking Active   lisinopril (ZESTRIL) 40 MG tablet [id number] No TAKE 1 TABLET BY MOUTH ONCE DAILY [name], NP Taking Active   meloxicam (MOBIC) 15 MG tablet [id number]  TAKE 1 TABLET BY MOUTH ONCE DAILY AS NEEDED WITH FOOD OR MILK [name], NP  Active   methocarbamol (ROBAXIN) 750 MG tablet [id number] No TAKE 1-2 TABLETS BY MOUTH 3 TIMES DAILY AS NEEDED [name], [name], NP Taking Active   mometasone-formoterol (DULERA) 100-5 MCG/ACT AERO [id number] No INHALE 2 PUFFS BY MOUTH TWICE A DAY. RINSE MOUTH AFTER USE. [name], PA-C Taking Active   montelukast (SINGULAIR) 10 MG tablet [id number] No Take 1 tablet (10 mg total) by mouth at bedtime. [name], NP Taking Active   nitroGLYCERIN (NITROSTAT) 0.4 MG SL tablet [id number]  DISSOLVE 1 TABLET UNDER TONGUE AT ONSET OF CHEST PAIN. REPEAT IN 5 MIN IF NOT RESOLVED, MAX 3 DOSES. 911 IF NEEDED. [name], NP  Active   omeprazole (PRILOSEC) 20 MG capsule [id number] No Take 1 capsule (20 mg total) by mouth daily. [name], NP Taking Active   QUEtiapine (SEROQUEL) 25 MG tablet [id number] No TAKE 1 TABLET BY MOUTH AT BEDTIME [name], NP Taking Active   SPIRIVA HANDIHALER 18 MCG inhalation capsule [id number]  INHALE CONTENTS OF 1 CAPSULE ONCE DAILY [name], NP  Active   triamcinolone cream (KENALOG) 0.1 % [id number] No APPLY 1 APPLICATION TOPICALLY TWICE  DAILY [name], NP Taking Active   TRULICITY 6.28 ZM/6.2HU SOPN [id number] No INJECT 0.75MG SUBQ ONCE A [name], NP Taking Active   Vitamin D, Ergocalciferol, (DRISDOL) 1.25 MG (50000 UNIT) CAPS capsule [id number] No TAKE 1 CAPSULE BY MOUTH ONCE EVERY 7 DAYS [name], NP Taking Active             Chandler Active Problem List   Diagnosis Date Noted   Fibrocystic disease of both breasts 05/07/2019   IFG (impaired fasting glucose) 05/03/2019   Chronic venous insufficiency 11/09/2018   Overactive bladder 09/30/2018   Peripheral neuropathy 09/30/2018   Apnea 01/02/2016   Lymphedema 11/30/2014   Dyspnea on exertion 11/30/2014   Anxiety 11/29/2014   Arthritis 11/29/2014   COPD (chronic obstructive pulmonary disease) ([place]) 11/29/2014   Clinical depression 11/29/2014   H/O eating disorder 11/29/2014   Esophageal reflux 11/29/2014   Acne inversa 11/29/2014   Essential hypertension 11/29/2014   HLD (hyperlipidemia) 11/29/2014  Adult hypothyroidism 11/29/2014   Insomnia 11/29/2014   Low back pain 11/29/2014   Morbid obesity ([place]) 11/29/2014   Posttraumatic stress disorder 11/29/2014   Vitamin D deficiency 11/29/2014   Nicotine dependence, cigarettes, uncomplicated 59/29/2446    Conditions to be addressed/monitored per PCP order:   Lift chair  Care Plan : General Social Work (Adult)  Updates made by Ethelda Chick since 04/30/2021 12:00 AM     Problem: Coping Skills (General Plan of Care)      Long-Range Goal: Coping Skills Enhanced   Start Date: 11/07/2020  Expected End Date: 04/10/2021  Recent Progress: On track  Priority: High  Note:   Timeframe:  Long-Range Goal Priority:  High Start Date:       11/07/20                      Expected End Date:  04/10/21  Current Barriers:  Financial constraints Mental Health Concerns  Social Isolation Limited education about mental health support resources that are available to her within the local  area* Cognitive Deficits Lacks knowledge of community resource  Clinical Social Work Clinical Goal(s):  Over the next 90 days, client will work with SW to address concerns related to experiencing ongoing symptoms of depression, sadness and grief since the passing of her mother Over the next 90 days, Chandler will work with LCSW to address needs related to implementing appropriate self-care and depression management.   Interventions: Chandler interviewed and appropriate assessments performed Provided mental health counseling with regards to Chandler's loss and ongoing stressors. Chandler was very close with her mother who passed away a few years ago and she continues to experience symptoms of grief from this loss. Chandler denies wanting LCSW to make a referral for grief counseling through [place] again but was receptive to coping skill and resource education/information that was provided during session today. Chandler reports decorating for the holidays helps her to connect with her mother as they did this together when she was alive.  Provided Chandler with information about managing depression within her daily life. Chandler has ongoing grief symptoms as well. Chandler states "I feel worn down." Chandler reports that she talks out loud to her mother's picture and this is very helpful to her. Grief support resource education provided again during session today.  Provided reflective listening and implemented appropriate interventions to help suppport Chandler and her emotional needs  Discussed plans with Chandler for ongoing care management follow up and provided Chandler with direct contact information for [facility] team Advised Chandler to contact [facility] team with any urgent case management needs. Assisted Chandler/caregiver with obtaining information about health plan benefits LCSW completed past referral for family to have a wheelchair ramp built at their residence due to ongoing mobility concerns. Ramp has been  successfully installed.  PCS actively involved with Chandler and providing ongoing services.  Positive reinforcement provided to Chandler for quitting smoking cigarettes. Coping skill review education provided for future triggers.  Brief self-care education provided to both Chandler and spouse. Chandler confirms that she continues to go to food pantry for food insecurity. Resource information has been provided.  Chandler reports ongoing pain and issues with mobility. Chandler shares that her pain resides in her pains and legs. Emotional support and coping skill education provided. Chandler confirms that her PCS aide continues to provide her with services. However, aide has to be reminded to put on her mask since Chandler is high risk. LCSW reviewed resources that [name] provided  her with in the past.  Chandler reports that she had a wonderful birthday spent with her family this past May. Chandler reports receiving appropriate socialization. Chandler reports that her spouse took her out to eat and gifted her with several things which was a nice surprise.  Chandler confirms stable transportation arrangements for upcoming PCP appointment this week.  Chandler repots that she is losing a lot of weight but is not sure how much she has lost as she does not have a scale. Chandler reports that a goal of hers is to lose weight but she has not been intentionally losing weight at this time.  Chandler reports that her aide [name] continues to assist her with PCS and is an excellent support within the home for her and spouse.  Chandler planted a tomato plant and successfully grew over 5 tomatoes over the summer. Chandler is very proud of this achievement as this was her first time planting a tomato plant and is now interested in picking up gardening/planting as a hobby. Chandler reports that her aunt continues to be a positive support for her in her life and someone that she can rely on in the case of emergencies.  Chandler was  educated on healthy self-care skills and advised her to use these into her daily routine.  04/10/21- Update- Chandler is agreeable to social work closure at this time as she still does not need medication management or counseling as she is managing her mental health well at this time. Chandler talks to her younger sister everyday to gain emotional support. Chandler says that her sister is into body building and helps to educate and remind her to do her own self-care. Chandler is agreeable to contact [facility] LCSW directly if any social work concern arise. [facility] LCSW will complete case closure at this time.  Chandler reports that she has been elevating her legs daily to elevate pain and swelling.  [facility] LCSW completed care coordination with Thomasene Lot on 01/15/21 and [facility] LCSW provided the following suggested resource-It is likely their best option given their loss of housing and lack of consistent income:  Publishing rights manager                                                                                                                                                                              (Emergency [place]) [phone] Ext. 104 M-F, 8am - 4pm Chandler was successfully approved for food stamps but is still experiencing financial concerns. She reports that she and her husband have no clothes that fit them and they are having to start using a string for their pants and Chandler only has one bra at this time. [facility] LCSW will  send referral to [facility] BSW today for financial support.  BSW spoke with Chandler about resources needed. Chandler states she does receive SSI and was approved for foodstamps. Chandler is familiar with all of the food pantries and other resources in [place]. BSW will assist Chandler with finding an eye doctor. Chandler states she has not been in 10 years. BSW will research and put a list of eye doctors together that accept Medicaid in [facility]. 04/30/20: BSW contacted  and spoke with Chandler, she stated she was not doing good because she found out that she has a new landlord and they are going up on her rent. BSW informed Chandler to make sure she informs her social workers at [employer] that they have an increase for foodstamps and Medicaid. Chandler stated that she sleeps on the couch and is in need of a power recliner/lift chair. BSW will send patients PCP a message asking for a script to be sent. Update- Chandler reports experiencing additional grief lately. [facility] LCSW offered referral for berievement counseling but she declined. Managing Loss, Adult People experience loss in many different ways throughout their lives. Events such as moving, changing jobs, and losing friends can create a sense of loss. The loss may be as serious as a major health change, divorce, death of a pet, or death of a loved one. All of these types of loss are likely to create a physical and emotional reaction known as grief. Grief is the result of a major change or an absence of something or someone that you count on. Grief is a normal reaction to loss. A variety of factors can affect your grieving experience, including: The nature of your loss. Your relationship to what or whom you lost. Your understanding of grief and how to manage it. Your support system. How to manage lifestyle changes Keep to your normal routine as much as possible. If you have trouble focusing or doing normal activities, it is acceptable to take some time away from your normal routine. Spend time with friends and loved ones. Eat a healthy diet, get plenty of sleep, and rest when you feel tired. How to recognize changes  The way that you deal with your grief will affect your ability to function as you normally do. When grieving, you may experience these changes: Numbness, shock, sadness, anxiety, anger, denial, and guilt. Thoughts about death. Unexpected crying. A physical sensation of emptiness in your stomach. Problems  sleeping and eating. Tiredness (fatigue). Loss of interest in normal activities. Dreaming about or imagining seeing the person who died. A need to remember what or whom you lost. Difficulty thinking about anything other than your loss for a period of time. Relief. If you have been expecting the loss for a while, you may feel a sense of relief when it happens. Follow these instructions at home:    Activity Express your feelings in healthy ways, such as: Talking with others about your loss. It may be helpful to find others who have had a similar loss, such as a support group. Writing down your feelings in a journal. Doing physical activities to release stress and emotional energy. Doing creative activities like painting, sculpting, or playing or listening to music. Practicing resilience. This is the ability to recover and adjust after facing challenges. Reading some resources that encourage resilience may help you to learn ways to practice those behaviors. General instructions Be Chandler with yourself and others. Allow the grieving process to happen, and remember that grieving takes time.  It is likely that you may never feel completely done with some grief. You may find a way to move on while still cherishing memories and feelings about your loss. Accepting your loss is a process. It can take months or longer to adjust. Keep all follow-up visits as told by your health care provider. This is important. Where to find support To get support for managing loss: Ask your health care provider for help and recommendations, such as grief counseling or therapy. Think about joining a support group for people who are managing a loss. Where to find more information You can find more information about managing loss from: American Society of Clinical Oncology: www.cancer.net American Psychological Association: TVStereos.ch Contact a health care provider if: Your grief is extreme and keeps getting  worse. You have ongoing grief that does not improve. Your body shows symptoms of grief, such as illness. You feel depressed, anxious, or lonely. Get help right away if: You have thoughts about hurting yourself or others. If you ever feel like you may hurt yourself or others, or have thoughts about taking your own life, get help right away. You can go to your nearest emergency department or call: Your local emergency services (911 in the [place] A suicide crisis helpline, such as the [place] at 925-270-6408. This is open 24 hours a day. Summary Grief is the result of a major change or an absence of someone or something that you count on. Grief is a normal reaction to loss. The depth of grief and the period of recovery depend on the type of loss and your ability to adjust to the change and process your feelings. Processing grief requires patience and a willingness to accept your feelings and talk about your loss with people who are supportive. It is important to find resources that work for you and to realize that people experience grief differently. There is not one grieving process that works for everyone in the same way. Be aware that when grief becomes extreme, it can lead to more severe issues like isolation, depression, anxiety, or suicidal thoughts. Talk with your health care provider if you have any of these issues. This information is not intended to replace advice given to you by your health care provider. Make sure you discuss any questions you have with your health care provider. Document Revised: 06/17/2018 Document Reviewed: 08/27/2016 Elsevier Chandler Education  [place] for Managing Grief   When you are experiencing grief, many resources and support are available to help you. You are not alone.    Grief Share (https://www.https://jacobson-moore.net/):    [employer] [facility] [place], [place]  [phone] [place], [place], [place],  [place], [place]   [facility] [address] [place], [place]   [place], [place]   If a Hospice or [place] team has been involved in the care of your loved one, you may reach out to your contact there or locally, you may reach out to [facility]:    [facility] [place] [facility]  [place], [place] [address] [phone] Chandler was educated on healthy self-care as she admits she has little motivation to walk lately. PCS recommendation was suggested.   Chandler Self Care Activities:  Attends all scheduled provider appointments Calls provider office for new concerns or questions   Please see past updates related to this goal by clicking on the "Past Updates" button in the selected goal        Follow up:  Chandler agrees to Care Plan and Follow-up.  Plan: The Managed Medicaid care management team will reach out to the Chandler again over the next 14 days.  Date/time of next scheduled Social Work care management/care coordination outreach:  05/20/20  Mickel Fuchs, Arita Miss, [place] Medicaid Team  [phone]

## 2021-05-02 ENCOUNTER — Other Ambulatory Visit: Payer: Self-pay

## 2021-05-06 ENCOUNTER — Other Ambulatory Visit: Payer: Self-pay | Admitting: Nurse Practitioner

## 2021-05-06 DIAGNOSIS — E039 Hypothyroidism, unspecified: Secondary | ICD-10-CM

## 2021-05-06 NOTE — Telephone Encounter (Signed)
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, limited to breakdown of skin ([place])   [facility] [name], NP   5 months ago Chronic venous insufficiency   [facility] [place], Santiago Glad, NP   6 months ago Chronic obstructive pulmonary disease, unspecified COPD type ([place])   [place], Karen, NP   7 months ago Insect bite of right foot, initial encounter   [facility] [name], NP      Future Appointments            In 4 weeks [name], NP [place], [facility]            furosemide (LASIX) 20 MG tablet [Pharmacy Med Name: FUROSEMIDE 20 MG TAB] 30 tablet 1    Sig: TAKE 1 TABLET BY MOUTH ONCE DAILY IN AFTERNOON AS NEEDED LEG EDEMA     Cardiovascular:  Diuretics - Loop Failed - 05/06/2021 11:52 AM      Failed - Last BP in normal range    BP Readings from Last 1 Encounters:  02/05/21 (!) 152/81         Passed - K in normal range and within 360 days    Potassium  Date Value Ref Range Status  02/05/2021 3.8 3.5 - 5.1 mmol/L Final  08/05/2014 4.5 mmol/L Final    Comment:    3.5-5.1 NOTE: New Reference Range  07/03/14          Passed - Ca in normal range and within 360 days    Calcium  Date Value Ref Range Status  02/05/2021 9.0 8.9 - 10.3 mg/dL Final   Calcium, Total  Date Value Ref Range Status  08/05/2014 8.2 (L) mg/dL Final    Comment:     8.9-10.3 NOTE: New Reference Range  07/03/14          Passed - Na in normal range and within 360 days    Sodium  Date Value Ref Range Status  02/05/2021 140 135 - 145 mmol/L Final  10/30/2020 140 134 - 144 mmol/L Final  08/05/2014 135 mmol/L Final    Comment:    135-145 NOTE: New Reference Range  07/03/14          Passed - Cr in normal range and within 360 days    Creatinine  Date Value Ref Range Status  08/05/2014 0.86 mg/dL Final    Comment:    0.44-1.00 NOTE: New Reference Range  07/03/14    Creatinine, Ser  Date Value Ref Range Status  02/05/2021 0.74 0.44 - 1.00 mg/dL Final         Passed - Valid encounter within last 6 months    Recent Outpatient Visits          4 months ago Essential hypertension   [place], [name], DO   5 months ago Diabetic ulcer of right  heel associated with diabetes mellitus due to underlying condition, limited to breakdown of skin [facility])   [facility] [name], NP   5 months ago Chronic venous insufficiency   [facility] [place], Santiago Glad, NP   6 months ago Chronic obstructive pulmonary disease, unspecified COPD type ([place])   [place], Karen, NP   7 months ago Insect bite of right foot, initial encounter   [facility] [name], NP      Future Appointments            In 4 weeks [name], NP [facility], [facility]            Accu-Chek Softclix Lancets lancets [employer] Med Name: ACCU-CHEK SOFTCLIX LANCETS] 100 each 1    Sig: [place]     Endocrinology: Diabetes - Testing Supplies Passed - 05/06/2021 11:52 AM      Passed - Valid encounter within last 12 months    Recent Outpatient Visits          4 months ago Essential hypertension   [place], [name], DO   5 months ago 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,  limited to breakdown of skin [facility])   [facility] [name], NP   5 months ago Chronic venous insufficiency   [place], Santiago Glad, NP   6 months ago Chronic obstructive pulmonary disease, unspecified COPD type ([place])   [place], Karen, NP   7 months ago Insect bite of right foot, initial encounter   [facility] [name], NP      Future Appointments            In 4 weeks [name], NP [place], [facility]            levothyroxine (SYNTHROID) 50 MCG tablet [Pharmacy Med Name: LEVOTHYROXINE SODIUM 50 MCG TAB] 90 tablet 1    Sig: TAKE 1 TABLET BY MOUTH ONCE DAILY ON AN EMPTY STOMACH. WAIT 30 MINUTES BEFORE TAKING OTHER MEDS.     Endocrinology:  Hypothyroid Agents Failed - 05/06/2021 11:52 AM      Failed - TSH needs to be rechecked within 3 months after an abnormal result. Refill until TSH is due.      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, limited to breakdown of skin [facility])   [facility] [name], NP   5 months ago Chronic venous insufficiency   [place], Santiago Glad, NP   6 months ago Chronic obstructive pulmonary disease, unspecified COPD type ([place])   [place], Karen, NP   7 months ago Insect bite of right foot, initial encounter   [facility] [name], NP      Future Appointments            In 4 weeks [name], NP [facility], [place]  simvastatin (ZOCOR) 40 MG tablet  [Pharmacy Med Name: SIMVASTATIN 40 MG TAB] 90 tablet     Sig: TAKE 1 TABLET BY MOUTH AT BEDTIME     Cardiovascular:  Antilipid - Statins Failed - 05/06/2021 11:52 AM      Failed - HDL in normal range and within 360 days    HDL  Date Value Ref Range Status  10/30/2020 35 (L) >39 mg/dL Final         Failed - Triglycerides in normal range and within 360 days    Triglycerides  Date Value Ref Range Status  10/30/2020 175 (H) 0 - 149 mg/dL Final         Passed - Total Cholesterol in normal range and within 360 days    Cholesterol, Total  Date Value Ref Range Status  10/30/2020 156 100 - 199 mg/dL Final         Passed - LDL in normal range and within 360 days    LDL Chol Calc (NIH)  Date Value Ref Range Status  10/30/2020 91 0 - 99 mg/dL Final         Passed - Patient is not pregnant      Passed - Valid encounter within last 12 months    Recent Outpatient Visits          4 months ago Essential hypertension   [facility] [place], [name], DO   5 months ago 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, [facility]

## 2021-05-06 NOTE — Telephone Encounter (Signed)
Spoke with patient and she states that she is currently taking the Atorvastatin and not the Simvastatin. Patient states she was scheduled to see an eye doctor and states the eye doctor doesn't take her insurance. Patient advised to give her insurance company a call to see who may be in network to have her Diabetic Eye Exam. Patient verbalized understanding. Please advise?

## 2021-05-06 NOTE — Telephone Encounter (Signed)
Requested medication (s) are due for refill today unsure  Requested medication (s) are on the active medication list yes/no  Future visit scheduled -yes  Last refill: Spiriva- 02/26/21 #90                  Simvastatin- no longer on current list  Notes to clinic: Request RF- little early for Spiriva. 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, limited to breakdown of skin ([place])   [facility] [name], NP   5 months ago Chronic venous insufficiency   [facility] [place], Santiago Glad, NP   6 months ago Chronic obstructive pulmonary disease, unspecified COPD type ([place])   [place], Karen, NP   7 months ago Insect bite of right foot, initial encounter   [facility] [name], NP       Future Appointments             In 4 weeks [name], NP [place], [facility]             simvastatin (ZOCOR) 40 MG tablet [Pharmacy Med Name: SIMVASTATIN 40 MG TAB] 90 tablet     Sig: TAKE 1 TABLET BY MOUTH AT BEDTIME     Cardiovascular:  Antilipid - Statins Failed - 05/06/2021 11:52 AM      Failed - HDL in normal range and within 360 days    HDL  Date Value Ref Range Status  10/30/2020 35 (L) >39 mg/dL Final          Failed - Triglycerides in normal range and within 360 days    Triglycerides  Date Value Ref Range  Status  10/30/2020 175 (H) 0 - 149 mg/dL Final          Passed - Total Cholesterol in normal range and within 360 days    Cholesterol, Total  Date Value Ref Range Status  10/30/2020 156 100 - 199 mg/dL Final          Passed - LDL in normal range and within 360 days    LDL Chol Calc (NIH)  Date Value Ref Range Status  10/30/2020 91 0 - 99 mg/dL Final          Passed - Patient is not pregnant      Passed - Valid encounter within last 12 months    Recent Outpatient Visits           4 months ago Essential hypertension   [facility] [place], [name], DO   5 months ago 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, limited to breakdown of skin [facility])   [facility] [name], NP   5 months ago Chronic venous insufficiency   [facility] [name], NP   6 months  ago Chronic obstructive pulmonary disease, unspecified COPD type ([place])   [place], Karen, NP   7 months ago Insect bite of right foot, initial encounter   [facility] [name], NP       Future Appointments             In 4 weeks [name], NP [place], [facility]            Signed Prescriptions Disp Refills   furosemide (LASIX) 20 MG tablet 30 tablet 1    Sig: TAKE 1 TABLET BY MOUTH ONCE DAILY IN AFTERNOON AS NEEDED LEG EDEMA     Cardiovascular:  Diuretics - Loop Failed - 05/06/2021 11:52 AM      Failed - Last BP in normal range    BP Readings from Last 1 Encounters:  02/05/21 (!) 152/81          Passed - K in normal range and within 360 days    Potassium  Date Value Ref Range Status  02/05/2021 3.8 3.5 - 5.1 mmol/L Final  08/05/2014 4.5 mmol/L Final    Comment:    3.5-5.1 NOTE: New Reference Range  07/03/14           Passed - Ca in normal range and within 360 days    Calcium  Date Value Ref Range Status  02/05/2021 9.0 8.9 - 10.3  mg/dL Final   Calcium, Total  Date Value Ref Range Status  08/05/2014 8.2 (L) mg/dL Final    Comment:    8.9-10.3 NOTE: New Reference Range  07/03/14           Passed - Na in normal range and within 360 days    Sodium  Date Value Ref Range Status  02/05/2021 140 135 - 145 mmol/L Final  10/30/2020 140 134 - 144 mmol/L Final  08/05/2014 135 mmol/L Final    Comment:    135-145 NOTE: New Reference Range  07/03/14           Passed - Cr in normal range and within 360 days    Creatinine  Date Value Ref Range Status  08/05/2014 0.86 mg/dL Final    Comment:    0.44-1.00 NOTE: New Reference Range  07/03/14    Creatinine, Ser  Date Value Ref Range Status  02/05/2021 0.74 0.44 - 1.00 mg/dL Final          Passed - Valid encounter within last 6 months    Recent Outpatient Visits           4 months ago Essential hypertension   [place], [name], DO   5 months ago 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, limited to breakdown of skin ([place])   [facility] [name], NP   5 months ago Chronic venous insufficiency   [facility] [place], Santiago Glad, NP   6 months ago Chronic obstructive pulmonary disease, unspecified COPD type ([place])   [place], Karen, NP   7 months ago Insect bite of right foot, initial encounter   [place], NP       Future Appointments             In 4 weeks [name], NP [facility], [facility]             Accu-Chek Softclix Lancets lancets 100 each 1    Sig: CHECK BLOOD SUGAR TWICE DAILY  Endocrinology: Diabetes - Testing Supplies Passed - 05/06/2021 11:52 AM      Passed - Valid encounter within last 12 months    Recent Outpatient Visits           4 months ago Essential hypertension   [place], [name], DO   5 months ago Diabetic  ulcer of right heel associated with diabetes mellitus due to underlying condition, limited to breakdown of skin [facility])   [facility] [name], NP   5 months ago Chronic venous insufficiency   [facility] [place], Santiago Glad, NP   6 months ago Chronic obstructive pulmonary disease, unspecified COPD type ([place])   [place], Karen, NP   7 months ago Insect bite of right foot, initial encounter   [place], NP       Future Appointments             In 4 weeks [name], NP [facility], [facility]             levothyroxine (SYNTHROID) 50 MCG tablet 90 tablet 1    Sig: TAKE 1 TABLET BY MOUTH ONCE DAILY ON AN EMPTY STOMACH. WAIT 30 MINUTES BEFORE TAKING OTHER MEDS.     Endocrinology:  Hypothyroid Agents Failed - 05/06/2021 11:52 AM      Failed - TSH needs to be rechecked within 3 months after an abnormal result. Refill until TSH is due.      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, limited to breakdown of skin ([place])   [facility] [name], NP   5 months ago Chronic venous insufficiency   [facility] [place], Santiago Glad, NP   6 months ago Chronic obstructive pulmonary disease, unspecified COPD type ([place])   [place], Karen, NP   7 months ago Insect bite of right foot, initial encounter   [facility] [name], NP       Future Appointments             In 4 weeks [name], NP [place], [place]                Requested Prescriptions  Pending Prescriptions Disp Refills   SPIRIVA HANDIHALER 18 MCG inhalation capsule [Pharmacy Med Name: SPIRIVA HANDIHALER 18 MCG INH CAP] 90 capsule 0    Sig: INHALE CONTENTS OF 1 CAPSULE ONCE DAILY     Pulmonology:  Anticholinergic Agents Passed - 05/06/2021 11:52 AM      Passed - Valid encounter within last 12 months    Recent Outpatient Visits           4 months ago Essential hypertension   [place], [name], DO   5 months ago 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, limited to breakdown of skin [facility])   [facility] [name], NP   5 months ago Chronic venous insufficiency  [facility] [name], NP   6 months ago Chronic obstructive pulmonary disease, unspecified COPD type ([place])   [facility] [name], NP   7 months ago Insect bite of right foot, initial encounter   [facility] [name], NP       Future Appointments             In 4 weeks [name], NP [place], [facility]             simvastatin (ZOCOR) 40 MG tablet [Pharmacy Med Name: SIMVASTATIN 40 MG TAB] 90 tablet     Sig: TAKE 1 TABLET BY MOUTH AT BEDTIME     Cardiovascular:  Antilipid - Statins Failed - 05/06/2021 11:52 AM      Failed - HDL in normal range and within 360 days    HDL  Date Value Ref Range Status  10/30/2020 35 (L) >39 mg/dL Final          Failed - Triglycerides in normal range and within 360 days    Triglycerides  Date Value Ref Range Status  10/30/2020 175 (H) 0 - 149 mg/dL Final          Passed - Total Cholesterol in normal range and within 360 days    Cholesterol, Total  Date Value Ref Range Status  10/30/2020 156 100 - 199 mg/dL Final          Passed - LDL in normal range and within 360 days    LDL Chol Calc (NIH)  Date Value Ref Range Status  10/30/2020 91 0 - 99  mg/dL Final          Passed - Patient is not pregnant      Passed - Valid encounter within last 12 months    Recent Outpatient Visits           4 months ago Essential hypertension   [facility] [place], [name], DO   5 months ago 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, limited to breakdown of skin ([place])   [facility] [name], NP   5 months ago Chronic venous insufficiency   [place], Santiago Glad, NP   6 months ago Chronic obstructive pulmonary disease, unspecified COPD type ([place])   [place], Karen, NP   7 months ago Insect bite of right foot, initial encounter   [facility] [name], NP       Future Appointments             In 4 weeks [name], NP [facility], [facility]            Signed Prescriptions Disp Refills   furosemide (LASIX) 20 MG tablet 30 tablet 1    Sig: TAKE 1 TABLET BY MOUTH ONCE DAILY IN AFTERNOON AS NEEDED LEG EDEMA     Cardiovascular:  Diuretics - Loop Failed - 05/06/2021 11:52 AM      Failed - Last BP in normal range    BP Readings from Last 1 Encounters:  02/05/21 (!) 152/81          Passed - K in normal range and within 360 days    Potassium  Date Value Ref Range Status  02/05/2021 3.8 3.5 - 5.1 mmol/L Final  08/05/2014 4.5 mmol/L Final    Comment:    3.5-5.1 NOTE: New Reference Range  07/03/14  Passed - Ca in normal range and within 360 days    Calcium  Date Value Ref Range Status  02/05/2021 9.0 8.9 - 10.3 mg/dL Final   Calcium, Total  Date Value Ref Range Status  08/05/2014 8.2 (L) mg/dL Final    Comment:    8.9-10.3 NOTE: New Reference Range  07/03/14           Passed - Na in normal range and within 360 days    Sodium  Date Value Ref Range Status  02/05/2021 140 135 - 145 mmol/L Final  10/30/2020 140 134 - 144 mmol/L Final  08/05/2014  135 mmol/L Final    Comment:    135-145 NOTE: New Reference Range  07/03/14           Passed - Cr in normal range and within 360 days    Creatinine  Date Value Ref Range Status  08/05/2014 0.86 mg/dL Final    Comment:    0.44-1.00 NOTE: New Reference Range  07/03/14    Creatinine, Ser  Date Value Ref Range Status  02/05/2021 0.74 0.44 - 1.00 mg/dL Final          Passed - Valid encounter within last 6 months    Recent Outpatient Visits           4 months ago Essential hypertension   [place], [name], DO   5 months ago 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, limited to breakdown of skin ([place])   [facility] [name], NP   5 months ago Chronic venous insufficiency   [facility] [place], Santiago Glad, NP   6 months ago Chronic obstructive pulmonary disease, unspecified COPD type ([place])   [place], Karen, NP   7 months ago Insect bite of right foot, initial encounter   [facility] [name], NP       Future Appointments             In 4 weeks [name], NP [facility], [facility]             Accu-Chek Softclix Lancets lancets 100 each 1    Sig: [place] DAILY     Endocrinology: Diabetes - Testing Supplies Passed - 05/06/2021 11:52 AM      Passed - Valid encounter within last 12 months    Recent Outpatient Visits           4 months ago Essential hypertension   [place], [name], DO   5 months ago 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, limited to breakdown of skin [facility])   [facility] [name], NP   5 months ago Chronic venous insufficiency   [place], Santiago Glad, NP   6 months ago Chronic obstructive pulmonary disease, unspecified COPD type ([place])   [place], Karen, NP   7 months ago Insect bite of right foot, initial encounter   [place], NP       Future Appointments             In 4 weeks [name], NP [facility], [facility]             levothyroxine (SYNTHROID) 50 MCG tablet 90 tablet 1    Sig: TAKE 1 TABLET BY MOUTH ONCE DAILY ON AN EMPTY STOMACH. WAIT 30  MINUTES BEFORE TAKING OTHER MEDS.     Endocrinology:  Hypothyroid Agents Failed - 05/06/2021 11:52 AM      Failed - TSH needs to be rechecked within 3 months after an abnormal result. Refill until TSH is due.      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

## 2021-05-13 DIAGNOSIS — Z993 Dependence on wheelchair: Secondary | ICD-10-CM | POA: Diagnosis not present

## 2021-05-13 DIAGNOSIS — R3981 Functional urinary incontinence: Secondary | ICD-10-CM | POA: Diagnosis not present

## 2021-05-20 ENCOUNTER — Ambulatory Visit: Payer: Self-pay

## 2021-05-21 ENCOUNTER — Other Ambulatory Visit: Payer: Self-pay

## 2021-05-21 NOTE — Patient Outreach (Addendum)
Medicaid Managed Care Social Work Note  05/21/2021 Name:  Cheryl Chandler MRN:  [id number] DOB:  1966-04-25  Cheryl Chandler is an 56 y.o. year old female who is a primary patient of [name], NP.  The Medicaid Managed Care Coordination team was consulted for assistance with:  Community Resources   Ms. Hobbs was given information about Medicaid Managed Care Coordination team services today. Lavina Patient agreed to services and verbal consent obtained.  Engaged with patient  for by telephone forfollow up visit in response to referral for case management and/or care coordination services.   Assessments/Interventions:  Review of past medical history, allergies, medications, health status, including review of consultants reports, laboratory and other test data, was performed as part of comprehensive evaluation and provision of chronic care management services.  SDOH: (Social Determinant of Health) assessments and interventions performed: BSW completed follow up telephone call with patient. She states she is unsure if their new landlord has received their January rent. Patient states she is also worried due to the new landlord making them pay rent online and on the 1st of the month. Patient states she is unable to get anyone on the phone. Patient states her rent will go up to 400 from 255 in March. Patient states she does not have many clothes but goes to [employer] to get some Materials engineer. Patient states she is in need of another eye doctor because [facility] no longer accepts her insurance, patient requested a list of other eye doctors that accept [facility] and the telephone number to switch her insurance. No other resources needed at this time.    Advanced Directives Status:  Not addressed in this encounter.  Care Plan                 Allergies  Allergen Reactions   Codeine Shortness Of Breath and Rash   Percocet [Oxycodone-Acetaminophen]  Shortness Of Breath   Sulfa Antibiotics Shortness Of Breath and Rash   Aspirin Hives   Bee Venom     Bee stings   Darvon [Propoxyphene]     Darvocet-N 100   Latex    Oxycodone Nausea And Vomiting   Penicillins     Has patient had a PCN reaction causing immediate rash, facial/tongue/throat swelling, SOB or lightheadedness with hypotension: Unknown Has patient had a PCN reaction causing severe rash involving mucus membranes or skin necrosis: Unknown Has patient had a PCN reaction that required hospitalization: Unknown Has patient had a PCN reaction occurring within the last 10 years: Unknown If all of the above answers are "NO", then may proceed with Cephalosporin use.    Medications Reviewed Today     Reviewed by [name], RN (Registered Nurse) on 03/24/21 at 46  Med List Status: <None>   Medication Order Taking? Sig Documenting Provider Last Dose Status Informant  ACCU-CHEK GUIDE test strip [id number] No  [provider] Taking Active   Accu-Chek Softclix Lancets lancets [id number] No CHECK BLOOD SUGAR TWICE DAILY [name], NP Taking Active   albuterol (PROVENTIL) (2.5 MG/3ML) 0.083% nebulizer solution [id number] No INHALE CONTENTS OF 1 VIAL IN NEBULIZER EVERY 4 HOURS AS NEEDED [name], NP Taking Active   amLODipine (NORVASC) 5 MG tablet [id number]  Take 2 tablets (10 mg total) by mouth daily. Johnson, [name], DO  Active   atorvastatin (LIPITOR) 80 MG tablet [id number] No Take 1 tablet (80 mg total) by mouth daily. [name], NP Taking Active   Blood  Glucose Monitoring Suppl (ACCU-CHEK GUIDE) w/Device KIT [id number] No Use to check blood sugar 3 times a day. [name], NP Taking Active   carvedilol (COREG) 3.125 MG tablet [id number] No Take 1 tablet (3.125 mg total) by mouth 2 (two) times daily with a meal. [name], DO Taking Active   citalopram (CELEXA) 20 MG tablet [id number] No TAKE 1 TABLET BY MOUTH ONCE DAILY [name],  NP Taking Active   EPINEPHrine 0.3 mg/0.3 mL IJ SOAJ injection [id number] No USE AS DIRECTED. [name], NP Taking Active   furosemide (LASIX) 20 MG tablet [id number]  TAKE 1 TABLET BY MOUTH ONCE DAILY IN AFTERNOON AS NEEDED LEG EDEMA [name], NP  Active   furosemide (LASIX) 40 MG tablet [id number] No Take 1 tablet (40 mg total) by mouth daily. [name], PA-C Taking Active            Med Note [name] Oct 14, 2020  1:41 PM) Told to take in AM, not afternoon  gabapentin (NEURONTIN) 300 MG capsule [id number]  TAKE 1 CAPSULE BY MOUTH 3 TIMES DAILY ASNEEDED (NEED OFFICE VISIT FOR FURTHER REFILLS) [name], NP  Active   levothyroxine (SYNTHROID) 50 MCG tablet [id number] No TAKE 1 TABLET BY MOUTH ONCE DAILY ON AN EMPTY STOMACH. WAIT 30 MINUTES BEFORE TAKING OTHER MEDS. [name], NP Taking Active   lisinopril (ZESTRIL) 40 MG tablet [id number] No TAKE 1 TABLET BY MOUTH ONCE DAILY [name], NP Taking Active   meloxicam (MOBIC) 15 MG tablet [id number]  TAKE 1 TABLET BY MOUTH ONCE DAILY AS NEEDED WITH FOOD OR MILK [name], NP  Active   methocarbamol (ROBAXIN) 750 MG tablet [id number] No TAKE 1-2 TABLETS BY MOUTH 3 TIMES DAILY AS NEEDED [name], [name], NP Taking Active   mometasone-formoterol (DULERA) 100-5 MCG/ACT AERO [id number] No INHALE 2 PUFFS BY MOUTH TWICE A DAY. RINSE MOUTH AFTER USE. [name], PA-C Taking Active   montelukast (SINGULAIR) 10 MG tablet [id number] No Take 1 tablet (10 mg total) by mouth at bedtime. [name], NP Taking Active   nitroGLYCERIN (NITROSTAT) 0.4 MG SL tablet [id number]  DISSOLVE 1 TABLET UNDER TONGUE AT ONSET OF CHEST PAIN. REPEAT IN 5 MIN IF NOT RESOLVED, MAX 3 DOSES. 911 IF NEEDED. [name], NP  Active   omeprazole (PRILOSEC) 20 MG capsule [id number] No Take 1 capsule (20 mg total) by mouth daily. [name], NP Taking Active   QUEtiapine (SEROQUEL) 25 MG tablet  [id number] No TAKE 1 TABLET BY MOUTH AT BEDTIME [name], NP Taking Active   SPIRIVA HANDIHALER 18 MCG inhalation capsule [id number]  INHALE CONTENTS OF 1 CAPSULE ONCE DAILY [name], NP  Active   triamcinolone cream (KENALOG) 0.1 % [id number] No APPLY 1 APPLICATION TOPICALLY TWICE DAILY [name], NP Taking Active   TRULICITY 6.28 ZM/6.2HU SOPN [id number] No INJECT 0.75MG SUBQ ONCE A WEEK [name], NP Taking Active   Vitamin D, Ergocalciferol, (DRISDOL) 1.25 MG (50000 UNIT) CAPS capsule [id number] No TAKE 1 CAPSULE BY MOUTH ONCE EVERY 7 DAYS [name], NP Taking Active             Patient Active Problem List   Diagnosis Date Noted   Fibrocystic disease of both breasts 05/07/2019   IFG (impaired fasting glucose) 05/03/2019   Chronic venous insufficiency 11/09/2018   Overactive bladder 09/30/2018   Peripheral neuropathy 09/30/2018   Apnea 01/02/2016   Lymphedema 11/30/2014   Dyspnea on exertion  11/30/2014   Anxiety 11/29/2014   Arthritis 11/29/2014   COPD (chronic obstructive pulmonary disease) ([place]) 11/29/2014   Clinical depression 11/29/2014   H/O eating disorder 11/29/2014   Esophageal reflux 11/29/2014   Acne inversa 11/29/2014   Essential hypertension 11/29/2014   HLD (hyperlipidemia) 11/29/2014   Adult hypothyroidism 11/29/2014   Insomnia 11/29/2014   Low back pain 11/29/2014   Morbid obesity ([place]) 11/29/2014   Posttraumatic stress disorder 11/29/2014   Vitamin D deficiency 11/29/2014   Nicotine dependence, cigarettes, uncomplicated 42/59/5638    Conditions to be addressed/monitored per PCP order:   community resources  Care Plan : General Social Work (Adult)  Updates made by Ethelda Chick since 05/21/2021 12:00 AM     Problem: Coping Skills (General Plan of Care)      Long-Range Goal: Coping Skills Enhanced   Start Date: 11/07/2020  Expected End Date: 04/10/2021  Recent Progress: On track  Priority: High  Note:    Timeframe:  Long-Range Goal Priority:  High Start Date:       11/07/20                      Expected End Date:  04/10/21  Current Barriers:  Financial constraints Mental Health Concerns  Social Isolation Limited education about mental health support resources that are available to her within the local area* Cognitive Deficits Lacks knowledge of community resource  Clinical Social Work Clinical Goal(s):  Over the next 90 days, client will work with SW to address concerns related to experiencing ongoing symptoms of depression, sadness and grief since the passing of her mother Over the next 90 days, patient will work with LCSW to address needs related to implementing appropriate self-care and depression management.   Interventions: Patient interviewed and appropriate assessments performed Provided mental health counseling with regards to patient's loss and ongoing stressors. Patient was very close with her mother who passed away a few years ago and she continues to experience symptoms of grief from this loss. Patient denies wanting LCSW to make a referral for grief counseling through [place] again but was receptive to coping skill and resource education/information that was provided during session today. Patient reports decorating for the holidays helps her to connect with her mother as they did this together when she was alive.  Provided patient with information about managing depression within her daily life. Patient has ongoing grief symptoms as well. Patient states "I feel worn down." Patient reports that she talks out loud to her mother's picture and this is very helpful to her. Grief support resource education provided again during session today.  Provided reflective listening and implemented appropriate interventions to help suppport patient and her emotional needs  Discussed plans with patient for ongoing care management follow up and provided patient with direct contact information  for [facility] team Advised patient to contact [facility] team with any urgent case management needs. Assisted patient/caregiver with obtaining information about health plan benefits LCSW completed past referral for family to have a wheelchair ramp built at their residence due to ongoing mobility concerns. Ramp has been successfully installed.  PCS actively involved with patient and providing ongoing services.  Positive reinforcement provided to patient for quitting smoking cigarettes. Coping skill review education provided for future triggers.  Brief self-care education provided to both patient and spouse. Patient confirms that she continues to go to food pantry for food insecurity. Resource information has been provided.  Patient reports ongoing pain and issues with mobility. Patient shares  that her pain resides in her pains and legs. Emotional support and coping skill education provided. Patient confirms that her PCS aide continues to provide her with services. However, aide has to be reminded to put on her mask since patient is high risk. LCSW reviewed resources that [name] provided her with in the past.  Patient reports that she had a wonderful birthday spent with her family this past May. Patient reports receiving appropriate socialization. Patient reports that her spouse took her out to eat and gifted her with several things which was a nice surprise.  Patient confirms stable transportation arrangements for upcoming PCP appointment this week.  Patient repots that she is losing a lot of weight but is not sure how much she has lost as she does not have a scale. Patient reports that a goal of hers is to lose weight but she has not been intentionally losing weight at this time.  Patient reports that her aide [name] continues to assist her with PCS and is an excellent support within the home for her and spouse.  Patient planted a tomato plant and successfully grew over 5 tomatoes over the summer. Patient is  very proud of this achievement as this was her first time planting a tomato plant and is now interested in picking up gardening/planting as a hobby. Patient reports that her aunt continues to be a positive support for her in her life and someone that she can rely on in the case of emergencies.  Patient was educated on healthy self-care skills and advised her to use these into her daily routine.  04/10/21- Update- Patient is agreeable to social work closure at this time as she still does not need medication management or counseling as she is managing her mental health well at this time. Patient talks to her younger sister everyday to gain emotional support. Patient says that her sister is into body building and helps to educate and remind her to do her own self-care. Patient is agreeable to contact [facility] LCSW directly if any social work concern arise. [facility] LCSW will complete case closure at this time.  Patient reports that she has been elevating her legs daily to elevate pain and swelling.  [facility] LCSW completed care coordination with Thomasene Lot on 01/15/21 and [facility] LCSW provided the following suggested resource-It is likely their best option given their loss of housing and lack of consistent income:  Publishing rights manager                                                                                                                                                                              (Emergency [place]) [phone] Ext. 104 M-F, 8am -  4pm Patient was successfully approved for food stamps but is still experiencing financial concerns. She reports that she and her husband have no clothes that fit them and they are having to start using a string for their pants and patient only has one bra at this time. [facility] LCSW will send referral to [facility] BSW today for financial support.  BSW spoke with patient about resources needed. Patient states she does receive SSI and was approved for  foodstamps. Patient is familiar with all of the food pantries and other resources in [place]. BSW will assist patient with finding an eye doctor. Patient states she has not been in 10 years. BSW will research and put a list of eye doctors together that accept Medicaid in [facility]. 04/30/20: BSW contacted and spoke with patient, she stated she was not doing good because she found out that she has a new landlord and they are going up on her rent. BSW informed patient to make sure she informs her social workers at [employer] that they have an increase for foodstamps and Medicaid. Patient stated that she sleeps on the couch and is in need of a power recliner/lift chair. BSW will send patients PCP a message asking for a script to be sent. 05/21/21: BSW completed follow up telephone call with patient. She states she is unsure if their new landlord has received their January rent. Patient states she is also worried due to the new landlord making them pay rent online and on the 1st of the month. Patient states she is unable to get anyone on the phone. Patient states her rent will go up to 400 from 255 in March. Patient states she does not have many clothes but goes to [employer] to get some Materials engineer. No other resources needed at this time.   Update- Patient reports experiencing additional grief lately. [facility] LCSW offered referral for berievement counseling but she declined. Managing Loss, Adult People experience loss in many different ways throughout their lives. Events such as moving, changing jobs, and losing friends can create a sense of loss. The loss may be as serious as a major health change, divorce, death of a pet, or death of a loved one. All of these types of loss are likely to create a physical and emotional reaction known as grief. Grief is the result of a major change or an absence of something or someone that you count on. Grief is a normal reaction to loss. A variety of factors can  affect your grieving experience, including: The nature of your loss. Your relationship to what or whom you lost. Your understanding of grief and how to manage it. Your support system. How to manage lifestyle changes Keep to your normal routine as much as possible. If you have trouble focusing or doing normal activities, it is acceptable to take some time away from your normal routine. Spend time with friends and loved ones. Eat a healthy diet, get plenty of sleep, and rest when you feel tired. How to recognize changes  The way that you deal with your grief will affect your ability to function as you normally do. When grieving, you may experience these changes: Numbness, shock, sadness, anxiety, anger, denial, and guilt. Thoughts about death. Unexpected crying. A physical sensation of emptiness in your stomach. Problems sleeping and eating. Tiredness (fatigue). Loss of interest in normal activities. Dreaming about or imagining seeing the person who died. A need to remember what or whom you lost. Difficulty thinking about anything  other than your loss for a period of time. Relief. If you have been expecting the loss for a while, you may feel a sense of relief when it happens. Follow these instructions at home:    Activity Express your feelings in healthy ways, such as: Talking with others about your loss. It may be helpful to find others who have had a similar loss, such as a support group. Writing down your feelings in a journal. Doing physical activities to release stress and emotional energy. Doing creative activities like painting, sculpting, or playing or listening to music. Practicing resilience. This is the ability to recover and adjust after facing challenges. Reading some resources that encourage resilience may help you to learn ways to practice those behaviors. General instructions Be patient with yourself and others. Allow the grieving process to happen, and remember that  grieving takes time. It is likely that you may never feel completely done with some grief. You may find a way to move on while still cherishing memories and feelings about your loss. Accepting your loss is a process. It can take months or longer to adjust. Keep all follow-up visits as told by your health care provider. This is important. Where to find support To get support for managing loss: Ask your health care provider for help and recommendations, such as grief counseling or therapy. Think about joining a support group for people who are managing a loss. Where to find more information You can find more information about managing loss from: American Society of Clinical Oncology: www.cancer.net American Psychological Association: TVStereos.ch Contact a health care provider if: Your grief is extreme and keeps getting worse. You have ongoing grief that does not improve. Your body shows symptoms of grief, such as illness. You feel depressed, anxious, or lonely. Get help right away if: You have thoughts about hurting yourself or others. If you ever feel like you may hurt yourself or others, or have thoughts about taking your own life, get help right away. You can go to your nearest emergency department or call: Your local emergency services (911 in the [place] A suicide crisis helpline, such as the [place] at (979)134-4164. This is open 24 hours a day. Summary Grief is the result of a major change or an absence of someone or something that you count on. Grief is a normal reaction to loss. The depth of grief and the period of recovery depend on the type of loss and your ability to adjust to the change and process your feelings. Processing grief requires patience and a willingness to accept your feelings and talk about your loss with people who are supportive. It is important to find resources that work for you and to realize that people experience grief differently.  There is not one grieving process that works for everyone in the same way. Be aware that when grief becomes extreme, it can lead to more severe issues like isolation, depression, anxiety, or suicidal thoughts. Talk with your health care provider if you have any of these issues. This information is not intended to replace advice given to you by your health care provider. Make sure you discuss any questions you have with your health care provider. Document Revised: 06/17/2018 Document Reviewed: 08/27/2016 Elsevier Patient Education  [place] for Managing Grief   When you are experiencing grief, many resources and support are available to help you. You are not alone.    Grief Share (https://www.https://jacobson-moore.net/):    [employer]  [employer] [place], [place]  [phone] [address], [place], [place], [place], [place]   [facility] [address] [place], [place]   [place], [place]   If a Hospice or [place] team has been involved in the care of your loved one, you may reach out to your contact there or locally, you may reach out to [facility]:    [facility] [place] [facility]   [place], [place] [address] [phone] Patient was educated on healthy self-care as she admits she has little motivation to walk lately. PCS recommendation was suggested.   Patient Self Care Activities:  Attends all scheduled provider appointments Calls provider office for new concerns or questions   Please see past updates related to this goal by clicking on the "Past Updates" button in the  selected goal        Follow up:  Patient agrees to Care Plan and Follow-up.  Plan: The Managed Medicaid care management team will reach out to the patient again over the next 30 days.  Date/time of next scheduled Social Work care management/care coordination outreach:  06/20/21  Mickel Fuchs, Arita Miss, [place] Medicaid Team  [phone]

## 2021-05-21 NOTE — Patient Instructions (Signed)
Visit Information  Ms. Resor was given information about Medicaid Managed Care team care coordination services as a part of their [facility] Medicaid benefit. Vernice Vanissa Strength verbally consented to engagement with the [facility] Managed Care team.   If you are experiencing a medical emergency, please call 911 or report to your local emergency department or urgent care.   If you have a non-emergency medical problem during routine business hours, please contact your provider's office and ask to speak with a nurse.   For questions related to your [facility] health plan, please call: [phone] or go here:[email or web address]  If you would like to schedule transportation through your [facility] plan, please call the following number at least 2 days in advance of your appointment: [phone].  Call the [facility] Crisis Line at [phone], at any time, 24 hours a day, 7 days a week. If you are in danger or need immediate medical attention call 911.  If you would like help to quit smoking, call 1-800-QUIT-NOW (775-035-5166) OR Espaol: 1-855-Djelo-Ya (7-782-423-5361) o para ms informacin haga clic aqu or Text READY to 443-154 to register via text  [name] - following are the goals we discussed in your visit today:   Goals Addressed   None      Social Worker will follow up 30 days.   [name], BSW, MHA [facility]   [place]  High Risk Managed Medicaid Team  [phone]   Following is a copy of your plan of care:  Care Plan : General Social Work (Adult)  Updates made by [name] since 05/21/2021 12:00 AM     Problem: Coping Skills (General Plan of Care)      Long-Range Goal: Coping Skills Enhanced   Start Date: 11/07/2020  Expected End Date: 04/10/2021  Recent Progress: On track  Priority: High  Note:   Timeframe:  Long-Range Goal Priority:  High Start Date:       11/07/20                       Expected End Date:  04/10/21  Current Barriers:  Financial constraints Mental Health Concerns  Social Isolation Limited education about mental health support resources that are available to her within the local area* Cognitive Deficits Lacks knowledge of community resource  Clinical Social Work Clinical Goal(s):  Over the next 90 days, client will work with SW to address concerns related to experiencing ongoing symptoms of depression, sadness and grief since the passing of her mother Over the next 90 days, patient will work with LCSW to address needs related to implementing appropriate self-care and depression management.   Interventions: Patient interviewed and appropriate assessments performed Provided mental health counseling with regards to patient's loss and ongoing stressors. Patient was very close with her mother who passed away a few years ago and she continues to experience symptoms of grief from this loss. Patient denies wanting LCSW to make a referral for grief counseling through [facility] again but was receptive to coping skill and resource education/information that was provided during session today. Patient reports decorating for the holidays helps her to connect with her mother as they did this together when she was alive.  Provided patient with information about managing depression within her daily life. Patient has ongoing grief symptoms as well. Patient states "I feel worn down." Patient reports that she talks out loud to her mother's picture and this is very helpful to her. Grief support resource  education provided again during session today.  Provided reflective listening and implemented appropriate interventions to help suppport patient and her emotional needs  Discussed plans with patient for ongoing care management follow up and provided patient with direct contact information for [facility] team Advised patient to contact [facility] team with any urgent case management  needs. Assisted patient/caregiver with obtaining information about health plan benefits LCSW completed past referral for family to have a wheelchair ramp built at their residence due to ongoing mobility concerns. Ramp has been successfully installed.  PCS actively involved with patient and providing ongoing services.  Positive reinforcement provided to patient for quitting smoking cigarettes. Coping skill review education provided for future triggers.  Brief self-care education provided to both patient and spouse. Patient confirms that she continues to go to food pantry for food insecurity. Resource information has been provided.  Patient reports ongoing pain and issues with mobility. Patient shares that her pain resides in her pains and legs. Emotional support and coping skill education provided. Patient confirms that her PCS aide continues to provide her with services. However, aide has to be reminded to put on her mask since patient is high risk. LCSW reviewed resources that [name] provided her with in the past.  Patient reports that she had a wonderful birthday spent with her family this past May. Patient reports receiving appropriate socialization. Patient reports that her spouse took her out to eat and gifted her with several things which was a nice surprise.  Patient confirms stable transportation arrangements for upcoming PCP appointment this week.  Patient repots that she is losing a lot of weight but is not sure how much she has lost as she does not have a scale. Patient reports that a goal of hers is to lose weight but she has not been intentionally losing weight at this time.  Patient reports that her aide [place] continues to assist her with PCS and is an excellent support within the home for her and spouse.  Patient planted a tomato plant and successfully grew over 5 tomatoes over the summer. Patient is very proud of this achievement as this was her first time planting a tomato plant  and is now interested in picking up gardening/planting as a hobby. Patient reports that her aunt continues to be a positive support for her in her life and someone that she can rely on in the case of emergencies.  Patient was educated on healthy self-care skills and advised her to use these into her daily routine.  04/10/21- Update- Patient is agreeable to social work closure at this time as she still does not need medication management or counseling as she is managing her mental health well at this time. Patient talks to her younger sister everyday to gain emotional support. Patient says that her sister is into body building and helps to educate and remind her to do her own self-care. Patient is agreeable to contact [facility] LCSW directly if any social work concern arise. [facility] LCSW will complete case closure at this time.  Patient reports that she has been elevating her legs daily to elevate pain and swelling.  [facility] LCSW completed care coordination with Osa Craver on 01/15/21 and [facility] LCSW provided the following suggested resource-It is likely their best option given their loss of housing and lack of consistent income:  Research scientist (physical sciences)                                                                                                                                                                              (  Emergency Crisis Housing) (514)429-7317 Ext. 104 M-F, 8am - 4pm Patient was successfully approved for food stamps but is still experiencing financial concerns. She reports that she and her husband have no clothes that fit them and they are having to start using a string for their pants and patient only has one bra at this time. [facility] LCSW will send referral to [facility] BSW today for financial support.  BSW spoke with patient about resources needed. Patient states she does receive SSI and was approved for foodstamps. Patient is familiar with all of the food pantries and other resources in  [place]. BSW will assist patient with finding an eye doctor. Patient states she has not been in 10 years. BSW will research and put a list of eye doctors together that accept Medicaid in [facility]. 04/30/20: BSW contacted and spoke with patient, she stated she was not doing good because she found out that she has a new landlord and they are going up on her rent. BSW informed patient to make sure she informs her social workers at [employer] that they have an increase for foodstamps and Medicaid. Patient stated that she sleeps on the couch and is in need of a power recliner/lift chair. BSW will send patients PCP a message asking for a script to be sent. 05/21/21: BSW completed follow up telephone call with patient. She states she is unsure if their new landlord has received their January rent. Patient states she is also worried due to the new landlord making them pay rent online and on the 1st of the month. Patient states she is unable to get anyone on the phone. Patient states her rent will go up to 400 from 255 in March. 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 No other resources needed at this time.   Update- Patient reports experiencing additional grief lately. [facility] LCSW offered referral for berievement counseling but she declined. Managing Loss, Adult People experience loss in many different ways throughout their lives. Events such as moving, changing jobs, and losing friends can create a sense of loss. The loss may be as serious as a major health change, divorce, death of a pet, or death of a loved one. All of these types of loss are likely to create a physical and emotional reaction known as grief. Grief is the result of a major change or an absence of something or someone that you count on. Grief is a normal reaction to loss. A variety of factors can affect your grieving experience, including: The nature of your loss. Your relationship  to what or whom you lost. Your understanding of grief and how to manage it. Your support system. How to manage lifestyle changes Keep to your normal routine as much as possible. If you have trouble focusing or doing normal activities, it is acceptable to take some time away from your normal routine. Spend time with friends and loved ones. Eat a healthy diet, get plenty of sleep, and rest when you feel tired. How to recognize changes  The way that you deal with your grief will affect your ability to function as you normally do. When grieving, you may experience these changes: Numbness, shock, sadness, anxiety, anger, denial, and guilt. Thoughts about death. Unexpected crying. A physical sensation of emptiness in your stomach. Problems sleeping and eating. Tiredness (fatigue). Loss of interest in normal activities. Dreaming about or imagining seeing the person who died. A need to remember  what or whom you lost. Difficulty thinking about anything other than your loss for a period of time. Relief. If you have been expecting the loss for a while, you may feel a sense of relief when it happens. Follow these instructions at home:    Activity Express your feelings in healthy ways, such as: Talking with others about your loss. It may be helpful to find others who have had a similar loss, such as a support group. Writing down your feelings in a journal. Doing physical activities to release stress and emotional energy. Doing creative activities like painting, sculpting, or playing or listening to music. Practicing resilience. This is the ability to recover and adjust after facing challenges. Reading some resources that encourage resilience may help you to learn ways to practice those behaviors. General instructions Be patient with yourself and others. Allow the grieving process to happen, and remember that grieving takes time. It is likely that you may never feel completely done with some  grief. You may find a way to move on while still cherishing memories and feelings about your loss. Accepting your loss is a process. It can take months or longer to adjust. Keep all follow-up visits as told by your health care provider. This is important. Where to find support To get support for managing loss: Ask your health care provider for help and recommendations, such as grief counseling or therapy. Think about joining a support group for people who are managing a loss. Where to find more information You can find more information about managing loss from: American Society of Clinical Oncology: www.cancer.net American Psychological Association: DiceTournament.cawww.apa.org Contact a health care provider if: Your grief is extreme and keeps getting worse. You have ongoing grief that does not improve. Your body shows symptoms of grief, such as illness. You feel depressed, anxious, or lonely. Get help right away if: You have thoughts about hurting yourself or others. If you ever feel like you may hurt yourself or others, or have thoughts about taking your own life, get help right away. You can go to your nearest emergency department or call: Your local emergency services (911 in the [place] A suicide crisis helpline, such as the National Suicide Prevention Lifeline at (765) 866-74121-204-782-1659. This is open 24 hours a day. Summary Grief is the result of a major change or an absence of someone or something that you count on. Grief is a normal reaction to loss. The depth of grief and the period of recovery depend on the type of loss and your ability to adjust to the change and process your feelings. Processing grief requires patience and a willingness to accept your feelings and talk about your loss with people who are supportive. It is important to find resources that work for you and to realize that people experience grief differently. There is not one grieving process that works for everyone in the same way. Be aware  that when grief becomes extreme, it can lead to more severe issues like isolation, depression, anxiety, or suicidal thoughts. Talk with your health care provider if you have any of these issues. This information is not intended to replace advice given to you by your health care provider. Make sure you discuss any questions you have with your health care provider. Document Revised: 06/17/2018 Document Reviewed: 08/27/2016 Elsevier Patient Education  2020 [employer]  Help for Managing Grief   When you are experiencing grief, many resources and support are available to help you. You are not alone.  Grief Share (https://www.SunglassSpecialist.glgriefshare.org/):    [employer] [facility] [address] [place], [place]      [facility]  [phone] [address] [place]     [employer] [address] [place]      [facility] [address] [place], [place]  [phone]   [facility] [address] [place], [place] [phone]   [facility] [address] [place], [place]     [phone]   [facility] [address] [place], [place]     [phone]   If a Hospice or Palliative Care team has been involved in the care of your loved one, you may reach out to your contact there or locally, you may reach out to [facility]:    [facility]   [address] [place], [place] [id number] [phone] Patient was educated on healthy self-care as she admits she has little motivation to walk lately. PCS recommendation was suggested.   Patient Self Care Activities:  Attends all scheduled provider appointments Calls provider office for new concerns or questions   Please see past updates related to this goal by clicking on the "Past Updates" button in the selected goal

## 2021-05-23 ENCOUNTER — Other Ambulatory Visit: Payer: Self-pay

## 2021-05-23 ENCOUNTER — Other Ambulatory Visit: Payer: Self-pay | Admitting: Obstetrics and Gynecology

## 2021-05-23 NOTE — Patient Outreach (Signed)
Medicaid Managed Care   Nurse Care Manager Note  05/23/2021 Name:  Cheryl Chandler MRN:  [id number] DOB:  [birth year]  Cheryl Chandler is an 56 y.o. year old female who is a primary patient of [name], NP.  The [facility] Managed Care Coordination team was consulted for assistance with:    Chronic healthcare  management needs  [name] was given information about Medicaid Managed Care Coordination team services today. [place] Patient agreed to services and verbal consent obtained.  Engaged with patient by telephone for follow up visit in response to provider referral for case management and/or care coordination services.   Assessments/Interventions:  Review of past medical history, allergies, medications, health status, including review of consultants reports, laboratory and other test data, was performed as part of comprehensive evaluation and provision of chronic care management services.  SDOH (Social Determinants of Health) assessments and interventions performed: [name]    Flowsheet Row Most Recent Value  [name]   Financial Strain Interventions Other (Comment)  [patient given resources]      Care Plan  Allergies  Allergen Reactions   Codeine Shortness Of Breath and Rash   Percocet [Oxycodone-Acetaminophen] Shortness Of Breath   Sulfa Antibiotics Shortness Of Breath and Rash   Aspirin Hives   Bee Venom     Bee stings   Darvon [Propoxyphene]     Darvocet-N 100   Latex    Oxycodone Nausea And Vomiting   Penicillins     Has patient had a PCN reaction causing immediate rash, facial/tongue/throat swelling, SOB or lightheadedness with hypotension: Unknown Has patient had a PCN reaction causing severe rash involving mucus membranes or skin necrosis: Unknown Has patient had a PCN reaction that required hospitalization: Unknown Has patient had a PCN reaction occurring within the last 10 years: Unknown If all of the  above answers are "NO", then may proceed with Cephalosporin use.   Medications Reviewed Today     Reviewed by [name], RN (Registered Nurse) on 05/23/21 at [phone]  Med List Status: <None>   Medication Order Taking? Sig Documenting Provider Last Dose Status Informant  Accu-Chek Softclix Lancets lancets [id number]  CHECK BLOOD SUGAR TWICE DAILY [name], NP  Active   albuterol (PROVENTIL) (2.5 MG/3ML) 0.083% nebulizer solution [id number] No INHALE CONTENTS OF 1 VIAL IN NEBULIZER EVERY 4 HOURS AS NEEDED [name], NP Taking Active   amLODipine (NORVASC) 5 MG tablet [id number] No TAKE 1 TABLET BY MOUTH ONCE DAILY [name], NP Taking Active   atorvastatin (LIPITOR) 80 MG tablet [id number] No Take 1 tablet (80 mg total) by mouth daily. [name], NP Taking Active   Blood Glucose Monitoring Suppl (ACCU-CHEK GUIDE) w/Device KIT [id number] No Use to check blood sugar 3 times a day. [name], NP Taking Active   carvedilol (COREG) 3.125 MG tablet [id number] No Take 1 tablet (3.125 mg total) by mouth 2 (two) times daily with a meal. [name], DO Taking Active   citalopram (CELEXA) 20 MG tablet [id number] No TAKE 1 TABLET BY MOUTH ONCE DAILY [name], NP Taking Active   EPINEPHrine 0.3 mg/0.3 mL IJ SOAJ injection [id number] No USE AS DIRECTED.  Patient not taking: Reported on 05/02/2021   [name], NP Not Taking Active   furosemide (LASIX) 20 MG tablet [id number]  TAKE 1 TABLET BY MOUTH ONCE DAILY IN AFTERNOON AS NEEDED LEG EDEMA [name], NP  Active   furosemide (LASIX) 40 MG tablet [id number] No Take  1 tablet (40 mg total) by mouth daily.  Patient not taking: Reported on 05/02/2021   [name], PA-C Not Taking Active            Med Note [name] Oct 14, 2020  1:41 PM) Told to take in AM, not afternoon  gabapentin (NEURONTIN) 300 MG capsule [id number] No TAKE 1 CAPSULE BY MOUTH 3 TIMES DAILY ASNEEDED (NEED  OFFICE VISIT FOR FURTHER REFILLS) [name], NP Taking Active   glucose blood (ACCU-CHEK GUIDE) test strip [id number]  USE TO CHECK BLOOD SUGAR 3 TIMES DAILY AS DIRECTED [name], NP  Active   levothyroxine (SYNTHROID) 50 MCG tablet [id number]  TAKE 1 TABLET BY MOUTH ONCE DAILY ON AN EMPTY STOMACH. WAIT 30 MINUTES BEFORE TAKING OTHER MEDS. [name], NP  Active   lisinopril (ZESTRIL) 40 MG tablet [id number] No TAKE 1 TABLET BY MOUTH ONCE DAILY [name], NP Taking Active   meloxicam (MOBIC) 15 MG tablet [id number] No TAKE 1 TABLET BY MOUTH ONCE DAILY AS NEEDED WITH FOOD OR MILK [name], NP Taking Active   methocarbamol (ROBAXIN) 750 MG tablet [id number] No TAKE 1-2 TABLETS BY MOUTH 3 TIMES DAILY AS NEEDED [name], [name], NP Taking Active   mometasone-formoterol (DULERA) 100-5 MCG/ACT AERO [id number] No INHALE 2 PUFFS BY MOUTH TWICE A DAY. RINSE MOUTH AFTER USE. [name], PA-C Taking Active   montelukast (SINGULAIR) 10 MG tablet [id number] No Take 1 tablet (10 mg total) by mouth at bedtime. [name], NP Taking Active   nitroGLYCERIN (NITROSTAT) 0.4 MG SL tablet [id number] No DISSOLVE 1 TABLET UNDER TONGUE AT ONSET OF CHEST PAIN. REPEAT IN 5 MIN IF NOT RESOLVED, MAX 3 DOSES. 911 IF NEEDED.  Patient not taking: Reported on 05/02/2021   [name], NP Not Taking Active   omeprazole (PRILOSEC) 20 MG capsule [id number] No Take 1 capsule (20 mg total) by mouth daily. [name], NP Taking Active   QUEtiapine (SEROQUEL) 25 MG tablet [id number] No TAKE 1 TABLET BY MOUTH AT BEDTIME [name], NP Taking Active   SPIRIVA HANDIHALER 18 MCG inhalation capsule [id number]  INHALE CONTENTS OF 1 CAPSULE ONCE DAILY [name], NP  Active   triamcinolone cream (KENALOG) 0.1 % [id number] No APPLY 1 APPLICATION TOPICALLY TWICE DAILY [name], NP Taking Active   TRULICITY 1.75 ZW/2.5EN SOPN [id number] No INJECT 0.75MG SUBQ ONCE A  WEEK [name], NP Taking Active   Vitamin D, Ergocalciferol, (DRISDOL) 1.25 MG (50000 UNIT) CAPS capsule [id number] No TAKE 1 CAPSULE BY MOUTH EVERY 7 DAYS [name], NP Taking Active            Patient Active Problem List   Diagnosis Date Noted   Fibrocystic disease of both breasts 05/07/2019   IFG (impaired fasting glucose) 05/03/2019   Chronic venous insufficiency 11/09/2018   Overactive bladder 09/30/2018   Peripheral neuropathy 09/30/2018   Apnea 01/02/2016   Lymphedema 11/30/2014   Dyspnea on exertion 11/30/2014   Anxiety 11/29/2014   Arthritis 11/29/2014   COPD (chronic obstructive pulmonary disease) ([place]) 11/29/2014   Clinical depression 11/29/2014   H/O eating disorder 11/29/2014   Esophageal reflux 11/29/2014   Acne inversa 11/29/2014   Essential hypertension 11/29/2014   HLD (hyperlipidemia) 11/29/2014   Adult hypothyroidism 11/29/2014   Insomnia 11/29/2014   Low back pain 11/29/2014   Morbid obesity ([place]) 11/29/2014   Posttraumatic stress disorder 11/29/2014   Vitamin D deficiency 11/29/2014   Nicotine dependence, cigarettes, uncomplicated  11/29/2014   Conditions to be addressed/monitored per PCP order:   chronic healthcare management needs, HTN, DM, LBP, HLD, COPD, tobacco use, lymphedema, overactive bladder, anxiety, depression, PTSD  Care Plan : RNCM: COPD (Adult)  Updates made by [name], RN since 05/23/2021 12:00 AM     Problem: RNCM: Psychological Adjustment to Diagnosis (COPD)   Priority: Medium  Onset Date: 05/22/2020     Long-Range Goal: RNCM: Adjustment to Disease Achieved   Start Date: 05/22/2020  Expected End Date: 03/27/2022  Recent Progress: On track  Priority: High  Note:   Current Barriers:  Knowledge deficits related to basic understanding of COPD disease process Knowledge deficits related to basic COPD self care/management Knowledge deficit related to basic understanding of how to use inhalers and how inhaled  medications work Knowledge deficit related to importance of energy conservation Limited Social Support Unable to independently manage COPD Lacks social connections Does not contact provider office for questions/concerns  Case Manager Clinical Goal(s): patient will report using inhalers as prescribed including rinsing mouth after use patient will be able to verbalize understanding of COPD action plan and when to seek appropriate levels of medical care  patient will engage in lite exercise as tolerated to build/regain stamina and strength and reduce shortness of breath through activity tolerance  patient will verbalize basic understanding of COPD disease process and self care activities  Interventions:  Collaboration with [name], NP regarding development and update of comprehensive plan of care as evidenced by provider attestation and co-signature Inter-disciplinary care team collaboration (see longitudinal plan of care) UNABLE to independently: manage COPD Provided patient with basic written and verbal COPD education on self care/management/and exacerbation prevention 05/23/21:  No change in SOB, Oxygen saturation 95%.  Next appt is 06/04/21 with PCP. Provided patient with COPD action plan and reinforced importance of daily self assessment.  Provided written and verbal instructions on pursed lip breathing and utilized returned demonstration as teach back Provided instruction about proper use of medications used for management of COPD including inhalers Advised patient to self assesses COPD action plan zone and make appointment with provider if in the yellow zone for 48 hours without improvement. Provided patient with education about the role of exercise in the management of COPD Advised patient to engage in light exercise as tolerated 3-5 days a week Provided education about and advised patient to utilize infection prevention strategies to reduce risk of respiratory infection. Patient  Goals/Self-Care Activities:  - decision-making supported - depression screen reviewed - emotional support provided - family involvement promoted - problem-solving facilitated - relaxation techniques promoted - verbalization of feelings encouraged Follow Up Plan: Telephone follow up appointment with care management team member scheduled.   Problem: RNCM: Symptom Exacerbation (COPD)   Priority: High  Onset Date: 05/22/2020     Long-Range Goal: RNCM: Smoking Cessation Symptom Exacerbation Prevented or Minimized   Start Date: 05/22/2020  Expected End Date: 08/22/2022  Recent Progress: Not on track  Priority: High  Note:   Current Barriers:  Unable to independently stop smoking  Does not adhere to provider recommendations re: smoking cessation  Lacks social connections Does not contact provider office for questions/concerns Tobacco abuse of >40 years; currently smoking 1 pack every 2 weeks Previous quit attempts, unsuccessful several successful using patches, gum- has a pattern of stopping and then starting back  Reports smoking within 30 minutes of waking up Reports triggers to smoke include: stress, chronic conditions  Reports motivation to quit smoking includes: knows it will  help her breathing and other chronic conditions  On a scale of 1-10, reports MOTIVATION to quit is 8 On a scale of 1-10, reports CONFIDENCE in quitting is 7 04/23/21:  patient states she is smoking 1 pack a week and trying to cut back on her own-is aware of resources. 05/23/21:  No changes- has appt with PCP 06/04/21 to discuss. Clinical Goal(s):  patient will work with RN Case Engineer, civil (consulting) and provider towards tobacco cessation  Interventions: Collaboration with [name], NP regarding development and update of comprehensive plan of care as evidenced by provider attestation and co-signature Inter-disciplinary care team collaboration (see longitudinal plan of care) Evaluation of  current treatment plan reviewed.  Provided contact information for [place] Quit Line (1-800-QUIT-NOW). Patient will outreach this group for support. Update 01/22/21:  Patient states she has utilized [employer] for resources. Discussed plans with patient for ongoing care management follow up and provided patient with direct contact information for care management team Provided patient with printed smoking cessation educational materials Provided contact information for [place] [facility]uit Line (1-800-QUIT-NOW). Patient will outreach this group for support. Patient Goals/Self-Care Activities patient will:  - mindfulness, talking to family and friends as a habit during cravings  - verbally commit to reducing tobacco consumption - barriers to lifestyle changes reviewed and addressed - barriers to treatment reviewed and addressed - breathing techniques encouraged - modification of home and work environment promoted - rescue (action) plan reviewed - signs/symptoms of infection reviewed - signs/symptoms of worsening disease assessed - treatment plan reviewed Follow Up Plan: Telephone follow up appointment with care management team member scheduled.    Care Plan : RNCM: Hypertension (Adult)  Updates made by [name], RN since 05/23/2021 12:00 AM     Problem: RNCM: Hypertension (Hypertension)   Priority: Medium  Onset Date: 05/22/2020     Long-Range Goal: RNCM: Hypertension Monitored   Start Date: 05/22/2020  Expected End Date: 08/22/2022  Recent Progress: On track  Priority: Medium  Note:    Current Barriers:  Knowledge Deficits related to basic understanding of hypertension pathophysiology and self care management Knowledge Deficits related to understanding of medications prescribed for management of hypertension Limited Social Support Unable to independently manage HTN Lacks social connections Does not contact provider office for questions/concern 05/23/21:  Patient's blood pressure remains  elevated 175-185/85, patient to  call provider and schedule an appointment Case Manager Clinical Goal(s):   patient will verbalize understanding of plan for hypertension management patient will demonstrate improved adherence to prescribed treatment plan for hypertension as evidenced by taking all medications as prescribed, monitoring and recording blood pressure as directed, adhering to low sodium/DASH diet patient will demonstrate improved health management independence as evidenced by checking blood pressure as directed and notifying PCP if SBP>160 or DBP > 90, taking all medications as prescribe, and adhering to a low sodium diet as discussed. Interventions:  Collaboration with [name], NP regarding development and update of comprehensive plan of care as evidenced by provider attestation and co-signature Inter-disciplinary care team collaboration (see longitudinal plan of care) Evaluation of current treatment plan related to hypertension self management and patient's adherence to plan as established by provider.  Provided education to patient re: stroke prevention, s/s of heart attack and stroke, DASH diet, complications of uncontrolled blood pressure Reviewed medications with patient and discussed importance of compliance.  Discussed plans with patient for ongoing care management follow up and provided patient with direct contact information for care management team  Advised patient, providing education and rationale, to monitor blood pressure daily and record, calling PCP for findings outside established parameters.  Patient Goals/Self-Care Activities Over the next 120 days, patient will:  - Self administers medications as prescribed Attends all scheduled provider appointments Calls provider office for new concerns, questions, or BP outside discussed parameters Checks BP and records as discussed Follows a low sodium diet/DASH diet - blood pressure trends reviewed - depression  screen reviewed - home or ambulatory blood pressure monitoring encouraged Follow Up Plan: Telephone follow up appointment with care management team member scheduled.  Transitioning to [facility] team. Will follow up with them at scheduled time.    Care Plan : RNCM: Depression (Adult)  Updates made by [name], RN since 05/23/2021 12:00 AM     Problem: RNCM: Symptoms (Depression)   Priority: High  Onset Date: 12/02/2020     Long-Range Goal: RNCM: Symptoms Monitored and Managed   Start Date: 12/02/2020  Expected End Date: 08/22/2022  Recent Progress: Not on track  Priority: High  Note:   Current Barriers:  Ineffective Self Health Maintenance in a patient with Anxiety and Depression Unable to independently manage depression and anxiety as evidence of multiple chronic conditions and the patient feeling her health is out of control  Does not attend all scheduled provider appointments Lacks social connections Unable to perform IADLs independently Does not contact provider office for questions/concerns 04/23/21:  Patient declines SW services at this time. 05/23/21:  Patient declines any services, intervention at this time. Clinical Goal(s):  Collaboration with [name], NP regarding development and update of comprehensive plan of care as evidenced by provider attestation and co-signature Inter-disciplinary care team collaboration (see longitudinal plan of care) patient will work with care management team to address care coordination and chronic disease management needs related to Disease Management Educational Needs Care Coordination Mental Health Counseling Level of Care Concerns   Interventions:  Evaluation of current treatment plan related to Anxiety and Depression, Financial constraints related to insurance not approving care for the patient to go to wound care , Limited social support, Level of care concerns, ADL IADL limitations, Mental Health Concerns , Family and relationship  dysfunction, Substance abuse issues -  smoker , and Inability to perform IADL's independently self-management and patient's adherence to plan as established by provider. Collaboration with [name], NP regarding development and update of comprehensive plan of care as evidenced by provider attestation       and co-signature Inter-disciplinary care team collaboration (see longitudinal plan of care) Discussed plans with patient for ongoing care management follow up and provided patient with direct contact information for care management team Evaluation of depression and anxiety.   Self Care Activities:  Patient verbalizes understanding of plan to effectively manage depression and anxiety Self administers medications as prescribed Attends all scheduled provider appointments Calls pharmacy for medication refills Attends church or other social activities Performs ADL's independently Performs IADL's independently Calls provider office for new concerns or questions Work with the [facility] team to meet needs for continued CCM services  Patient Goals: - activity or exercise based on tolerance encouraged - depression screen reviewed - emotional support provided - healthy lifestyle promoted - medication side effects monitored and managed - pain managed - participation in mental health treatment encouraged - quality of sleep assessed - response to mental health treatment monitored - response to pharmacologic therapy monitored - sleep hygiene techniques encouraged - social activities and relationships encouraged - substance use assessed Follow Up Plan:  The care management team will reach out to the patient again over the next 30 to 60 days.    Follow Up:  Patient agrees to Care Plan and Follow-up.  Plan: The Managed Medicaid care management team will reach out to the patient again over the next 30 days. and The  Patient has been provided with contact information for the Managed Medicaid care  management team and has been advised to call with any health related questions or concerns.  Date/time of next scheduled RN care management/care coordination outreach:  06/23/21 at 0900.

## 2021-05-23 NOTE — Patient Instructions (Signed)
Visit Information  Cheryl Chandler was given information about Medicaid Managed Care team care coordination services as a part of their [facility] Medicaid benefit. Cheryl Chandler verbally consented to engagement with the [facility] Managed Care team.   If you are experiencing a medical emergency, please call 911 or report to your local emergency department or urgent care.   If you have a non-emergency medical problem during routine business hours, please contact your provider's office and ask to speak with a nurse.   For questions related to your [facility] health plan, please call: [phone] or go here:[email or web address]  If you would like to schedule transportation through your [facility] plan, please call the following number at least 2 days in advance of your appointment: [phone].  Call the [facility] Crisis Line at [phone], at any time, 24 hours a day, 7 days a week. If you are in danger or need immediate medical attention call 911.  If you would like help to quit smoking, call 1-800-QUIT-NOW (508-066-6506) OR Espaol: 1-855-Djelo-Ya (4-536-468-0321) o para ms informacin haga clic aqu or Text READY to 224-825 to register via text  Cheryl Chandler - following are the goals we discussed in your visit today:   Goals Addressed             This Visit's Progress    RNCM: Track and Manage My Symptoms-Depression       Timeframe:  Long-Range Goal Priority:  High Start Date:    12-02-2020                         Expected End Date:   ongoing                Follow Up Date: 06/23/21   - avoid negative self-talk - develop a personal safety plan - develop a plan to deal with triggers like holidays, anniversaries - exercise at least 2 to 3 times per week - have a plan for how to handle bad days - journal feelings and what helps to feel better or worse - spend time or talk with others at least 2 to 3 times per week - spend time or talk with  others every day - watch for early signs of feeling worse    Why is this important?   Keeping track of your progress will help your treatment team find the right mix of medicine and therapy for you.  Write in your journal every day.  Day-to-day changes in depression symptoms are normal. It may be more helpful to check your progress at the end of each week instead of every day.     Notes:04/23/21:  patient declined SW services.     RNCM: Track and Manage My Triggers-COPD       Timeframe:  Long-Range Goal Priority:  Medium Start Date:     05-22-2020                        Expected End Date:   ongoing          Follow Up Date: 06/23/21   - avoid second hand smoke - eliminate smoking in my home - identify and avoid work-related triggers - identify and remove indoor air pollutants - limit outdoor activity during cold weather - listen for public air quality announcements every day    Why is this important?   Triggers are activities or things, like tobacco smoke or cold weather, that make your  COPD (chronic obstructive pulmonary disease) flare-up.  Knowing these triggers helps you plan how to stay away from them.  When you cannot remove them, you can learn how to manage them.     05/23/21: No change, Patient smoking 1 pack a week, going to try to stop on her own.  SOB is chronic-oxygen saturations 95%       The patient verbalized understanding of instructions provided today and declined a print copy of patient instruction materials.   The Managed Medicaid care management team will reach out to the patient again over the next 30 days.  The  Patient  has been provided with contact information for the Managed Medicaid care management team and has been advised to call with any health related questions or concerns.   Kathi Der RN, BSN [place]   [facility] Care Management Coordinator - Managed Medicaid High Risk [phone]   Following is a copy of your plan of care:   Care Plan : RNCM: COPD (Adult)  Updates made by [name], RN since 05/23/2021 12:00 AM     Problem: RNCM: Psychological Adjustment to Diagnosis (COPD)   Priority: Medium  Onset Date: 05/22/2020     Long-Range Goal: RNCM: Adjustment to Disease Achieved   Start Date: 05/22/2020  Expected End Date: 03/27/2022  Recent Progress: On track  Priority: High  Note:   Current Barriers:  Knowledge deficits related to basic understanding of COPD disease process Knowledge deficits related to basic COPD self care/management Knowledge deficit related to basic understanding of how to use inhalers and how inhaled medications work Knowledge deficit related to importance of energy conservation Limited Social Support Unable to independently manage COPD Lacks social connections Does not contact provider office for questions/concerns  Case Manager Clinical Goal(s): patient will report using inhalers as prescribed including rinsing mouth after use patient will be able to verbalize understanding of COPD action plan and when to seek appropriate levels of medical care  patient will engage in lite exercise as tolerated to build/regain stamina and strength and reduce shortness of breath through activity tolerance  patient will verbalize basic understanding of COPD disease process and self care activities  Interventions:  Collaboration with [name], NP regarding development and update of comprehensive plan of care as evidenced by provider attestation and co-signature Inter-disciplinary care team collaboration (see longitudinal plan of care) UNABLE to independently: manage COPD Provided patient with basic written and verbal COPD education on self care/management/and exacerbation prevention 05/23/21:  No change in SOB, Oxygen saturation 95%.  Next appt is 06/04/21 with PCP. Provided patient with COPD action plan and reinforced importance of daily self assessment.  Provided written and verbal  instructions on pursed lip breathing and utilized returned demonstration as teach back Provided instruction about proper use of medications used for management of COPD including inhalers Advised patient to self assesses COPD action plan zone and make appointment with provider if in the yellow zone for 48 hours without improvement. Provided patient with education about the role of exercise in the management of COPD Advised patient to engage in light exercise as tolerated 3-5 days a week Provided education about and advised patient to utilize infection prevention strategies to reduce risk of respiratory infection. Patient Goals/Self-Care Activities:  - decision-making supported - depression screen reviewed - emotional support provided - family involvement promoted - problem-solving facilitated - relaxation techniques promoted - verbalization of feelings encouraged Follow Up Plan: Telephone follow up appointment with care management team member scheduled.  Problem: RNCM: Symptom Exacerbation (COPD)   Priority: High  Onset Date: 05/22/2020     Long-Range Goal: RNCM: Smoking Cessation Symptom Exacerbation Prevented or Minimized   Start Date: 05/22/2020  Expected End Date: 08/22/2022  Recent Progress: Not on track  Priority: High  Note:   Current Barriers:  Unable to independently stop smoking  Does not adhere to provider recommendations re: smoking cessation  Lacks social connections Does not contact provider office for questions/concerns Tobacco abuse of >40 years; currently smoking 1 pack every 2 weeks Previous quit attempts, unsuccessful several successful using patches, gum- has a pattern of stopping and then starting back  Reports smoking within 30 minutes of waking up Reports triggers to smoke include: stress, chronic conditions  Reports motivation to quit smoking includes: knows it will help her breathing and other chronic conditions  On a scale of 1-10, reports MOTIVATION to  quit is 8 On a scale of 1-10, reports CONFIDENCE in quitting is 7 04/23/21:  patient states she is smoking 1 pack a week and trying to cut back on her own-is aware of resources. 05/23/21:  No changes- has appt with PCP 06/04/21 to discuss. Clinical Goal(s):  patient will work with RN Case Solicitor and provider towards tobacco cessation  Interventions: Collaboration with [name], NP regarding development and update of comprehensive plan of care as evidenced by provider attestation and co-signature Inter-disciplinary care team collaboration (see longitudinal plan of care) Evaluation of current treatment plan reviewed.  Provided contact information for [place] [facility]uit [facility] (1-800-QUIT-NOW). Patient will outreach this group for support. Update 01/22/21:  Patient states she has utilized [employer] for resources. Discussed plans with patient for ongoing care management follow up and provided patient with direct contact information for care management team Provided patient with printed smoking cessation educational materials Provided contact information for [place] [facility] (1-800-QUIT-NOW). Patient will outreach this group for support. Patient Goals/Self-Care Activities patient will:  - mindfulness, talking to family and friends as a habit during cravings  - verbally commit to reducing tobacco consumption - barriers to lifestyle changes reviewed and addressed - barriers to treatment reviewed and addressed - breathing techniques encouraged - modification of home and work environment promoted - rescue (action) plan reviewed - signs/symptoms of infection reviewed - signs/symptoms of worsening disease assessed - treatment plan reviewed Follow Up Plan: Telephone follow up appointment with care management team member scheduled.     Care Plan : RNCM: Hypertension (Adult)  Updates made by [name], RN since 05/23/2021 12:00 AM     Problem: RNCM: Hypertension  (Hypertension)   Priority: Medium  Onset Date: 05/22/2020     Long-Range Goal: RNCM: Hypertension Monitored   Start Date: 05/22/2020  Expected End Date: 08/22/2022  Recent Progress: On track  Priority: Medium  Note:    Current Barriers:  Knowledge Deficits related to basic understanding of hypertension pathophysiology and self care management Knowledge Deficits related to understanding of medications prescribed for management of hypertension Limited Social Support Unable to independently manage HTN Lacks social connections Does not contact provider office for questions/concern 05/23/21:  Patient's blood pressure remains elevated 175-185/85, patient to  call provider and schedule an appointment Case Manager Clinical Goal(s):   patient will verbalize understanding of plan for hypertension management patient will demonstrate improved adherence to prescribed treatment plan for hypertension as evidenced by taking all medications as prescribed, monitoring and recording blood pressure as directed, adhering to low sodium/DASH diet patient will demonstrate improved  health management independence as evidenced by checking blood pressure as directed and notifying PCP if SBP>160 or DBP > 90, taking all medications as prescribe, and adhering to a low sodium diet as discussed. Interventions:  Collaboration with Larae GroomsHoldsworth, Karen, NP regarding development and update of comprehensive plan of care as evidenced by provider attestation and co-signature Inter-disciplinary care team collaboration (see longitudinal plan of care) Evaluation of current treatment plan related to hypertension self management and patient's adherence to plan as established by provider.  Provided education to patient re: stroke prevention, s/s of heart attack and stroke, DASH diet, complications of uncontrolled blood pressure Reviewed medications with patient and discussed importance of compliance.  Discussed plans with patient for  ongoing care management follow up and provided patient with direct contact information for care management team Advised patient, providing education and rationale, to monitor blood pressure daily and record, calling PCP for findings outside established parameters.  Patient Goals/Self-Care Activities Over the next 120 days, patient will:  - Self administers medications as prescribed Attends all scheduled provider appointments Calls provider office for new concerns, questions, or BP outside discussed parameters Checks BP and records as discussed Follows a low sodium diet/DASH diet - blood pressure trends reviewed - depression screen reviewed - home or ambulatory blood pressure monitoring encouraged Follow Up Plan: Telephone follow up appointment with care management team member scheduled.  [facility] Will follow up with them at scheduled time.     Care Plan : RNCM: Depression (Adult)  Updates made by Danie Chandlerraft, Jamar Casagrande G, RN since 05/23/2021 12:00 AM     Problem: RNCM: Symptoms (Depression)   Priority: High  Onset Date: 12/02/2020     Long-Range Goal: RNCM: Symptoms Monitored and Managed   Start Date: 12/02/2020  Expected End Date: 08/22/2022  Recent Progress: Not on track  Priority: High  Note:   Current Barriers:  Ineffective Self Health Maintenance in a patient with Anxiety and Depression Unable to independently manage depression and anxiety as evidence of multiple chronic conditions and the patient feeling her health is out of control  Does not attend all scheduled provider appointments Lacks social connections Unable to perform IADLs independently Does not contact provider office for questions/concerns 04/23/21:  Patient declines SW services at this time. 05/23/21:  Patient declines any services, intervention at this time. Clinical Goal(s):  Collaboration with Larae GroomsHoldsworth, Karen, NP regarding development and update of comprehensive plan of care as evidenced by provider  attestation and co-signature Inter-disciplinary care team collaboration (see longitudinal plan of care) patient will work with care management team to address care coordination and chronic disease management needs related to Disease Management Educational Needs Care Coordination Mental Health Counseling Level of Care Concerns   Interventions:  Evaluation of current treatment plan related to Anxiety and Depression, Financial constraints related to insurance not approving care for the patient to go to wound care , Limited social support, Level of care concerns, ADL IADL limitations, Mental Health Concerns , Family and relationship dysfunction, Substance abuse issues -  smoker , and Inability to perform IADL's independently self-management and patient's adherence to plan as established by provider. Collaboration with Larae GroomsHoldsworth, Karen, NP regarding development and update of comprehensive plan of care as evidenced by provider attestation       and co-signature Inter-disciplinary care team collaboration (see longitudinal plan of care) Discussed plans with patient for ongoing care management follow up and provided patient with direct contact information for care management team Evaluation of depression and anxiety.  Self Care Activities:  Patient verbalizes understanding of plan to effectively manage depression and anxiety Self administers medications as prescribed Attends all scheduled provider appointments Calls pharmacy for medication refills Attends church or other social activities Performs ADL's independently Performs IADL's independently Calls provider office for new concerns or questions [facility] to meet needs for continued CCM services  Patient Goals: - activity or exercise based on tolerance encouraged - depression screen reviewed - emotional support provided - healthy lifestyle promoted - medication side effects monitored and managed - pain managed - participation  in mental health treatment encouraged - quality of sleep assessed - response to mental health treatment monitored - response to pharmacologic therapy monitored - sleep hygiene techniques encouraged - social activities and relationships encouraged - substance use assessed Follow Up Plan: The care management team will reach out to the patient again over the next 30 to 60 days.

## 2021-05-28 DIAGNOSIS — Z419 Encounter for procedure for purposes other than remedying health state, unspecified: Secondary | ICD-10-CM | POA: Diagnosis not present

## 2021-06-04 ENCOUNTER — Ambulatory Visit (INDEPENDENT_AMBULATORY_CARE_PROVIDER_SITE_OTHER): Payer: Medicaid Other | Admitting: Nurse Practitioner

## 2021-06-04 ENCOUNTER — Other Ambulatory Visit: Payer: Self-pay

## 2021-06-04 ENCOUNTER — Encounter: Payer: Self-pay | Admitting: Nurse Practitioner

## 2021-06-04 VITALS — BP 152/60 | HR 74 | Temp 98.4°F | Wt 331.0 lb

## 2021-06-04 DIAGNOSIS — F331 Major depressive disorder, recurrent, moderate: Secondary | ICD-10-CM

## 2021-06-04 DIAGNOSIS — R8290 Unspecified abnormal findings in urine: Secondary | ICD-10-CM

## 2021-06-04 DIAGNOSIS — R7301 Impaired fasting glucose: Secondary | ICD-10-CM | POA: Diagnosis not present

## 2021-06-04 DIAGNOSIS — I10 Essential (primary) hypertension: Secondary | ICD-10-CM | POA: Diagnosis not present

## 2021-06-04 DIAGNOSIS — E039 Hypothyroidism, unspecified: Secondary | ICD-10-CM | POA: Diagnosis not present

## 2021-06-04 DIAGNOSIS — Z0000 Encounter for general adult medical examination without abnormal findings: Secondary | ICD-10-CM

## 2021-06-04 DIAGNOSIS — J449 Chronic obstructive pulmonary disease, unspecified: Secondary | ICD-10-CM

## 2021-06-04 DIAGNOSIS — E785 Hyperlipidemia, unspecified: Secondary | ICD-10-CM

## 2021-06-04 DIAGNOSIS — Z1231 Encounter for screening mammogram for malignant neoplasm of breast: Secondary | ICD-10-CM | POA: Diagnosis not present

## 2021-06-04 LAB — URINALYSIS, ROUTINE W REFLEX MICROSCOPIC
Bilirubin, UA: NEGATIVE
Glucose, UA: NEGATIVE
Ketones, UA: NEGATIVE
Leukocytes,UA: NEGATIVE
Nitrite, UA: NEGATIVE
Protein,UA: NEGATIVE
Specific Gravity, UA: 1.025 (ref 1.005–1.030)
Urobilinogen, Ur: 0.2 mg/dL (ref 0.2–1.0)
pH, UA: 5.5 (ref 5.0–7.5)

## 2021-06-04 LAB — MICROSCOPIC EXAMINATION: WBC, UA: NONE SEEN /hpf (ref 0–5)

## 2021-06-04 MED ORDER — HYDRALAZINE HCL 10 MG PO TABS: 10.0000 mg | ORAL_TABLET | Freq: Three times a day (TID) | ORAL | 1 refills | Status: DC

## 2021-06-04 MED ORDER — CITALOPRAM HYDROBROMIDE 40 MG PO TABS: 40.0000 mg | ORAL_TABLET | Freq: Every day | ORAL | 1 refills | Status: DC

## 2021-06-04 NOTE — Assessment & Plan Note (Signed)
Labs ordered today.  Will make recommendations based on lab results. ?

## 2021-06-04 NOTE — Addendum Note (Signed)
Addended by: [name] on: 06/04/2021 03:49 PM   Modules accepted: Orders

## 2021-06-04 NOTE — Assessment & Plan Note (Signed)
Chronic. Continues to have SOB.  Would benefit from daily inhaler.  Still using Albuterol TID.  Discussed how to properly use them Medication.  Follow up in 1 month.

## 2021-06-04 NOTE — Assessment & Plan Note (Signed)
Chronic. Not well controlled.  Will add Hydralazine 10mg  TID to help bring down blood pressure. Discussed side effects and benefits of medication with patient during visit.  Follow up in 1 month for reevaluation.  Keep tracking blood pressures at home.  Call sooner if concerns arise.

## 2021-06-04 NOTE — Progress Notes (Signed)
BP (!) 152/60    Pulse 74    Temp 98.4 F (36.9 C) (Oral)    Wt (!) 331 lb (150.1 kg)    LMP 06/09/2017 (Approximate)    SpO2 96%    BMI 56.82 kg/m    Subjective:    Patient ID: Cheryl Chandler, female    DOB: 09-25-1965, 56 y.o.   MRN: [id number]  HPI: Cheryl Chandler is a 56 y.o. female presenting on 06/04/2021 for comprehensive medical examination. 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     HYPERTENSION / HYPERLIPIDEMIA Satisfied with current treatment? yes Duration of hypertension: years BP monitoring frequency: daily BP range: 150/90 BP medication side effects: no Past BP meds: lasix, carvedilol and lisinopril Duration of hyperlipidemia: years Cholesterol medication side effects: no Cholesterol supplements: none Past cholesterol medications: simvastatin (zocor) Medication compliance: excellent compliance Aspirin: no Recent stressors: no Recurrent headaches: no Visual changes: no Palpitations: no Dyspnea: yes Chest pain: no Lower extremity edema: yes Dizzy/lightheaded: no  COPD COPD status: controlled Satisfied with current treatment?: yes Oxygen use: no Dyspnea frequency: yes Cough frequency: daily Rescue inhaler frequency:  Limitation of activity: yes Productive cough:  Last Spirometry:  Pneumovax: Up to Date Influenza: Up to Date  HYPOTHYROIDISM Thyroid control status:controlled Satisfied with current treatment? yes Medication side effects: no Medication compliance: excellent compliance Etiology of hypothyroidism: still using it PRN Recent dose adjustment:no Fatigue: yes Cold intolerance: no Heat intolerance: no Weight gain: no Weight loss: no Constipation: no Diarrhea/loose stools: no Palpitations: no Lower extremity edema:  yes Anxiety/depressed mood: no  DEPRESSION/ANXIETY Patient states she feels like her mood is doing okay.  She does have days when she is sad from losing people in her life.  Denies SI.  Depression Screen done today and results listed below:  Depression screen [facility] 2/9 06/04/2021 05/23/2021 12/02/2020 09/27/2020 05/03/2019  Decreased Interest '3 2 2 ' 0 3  Down, Depressed, Hopeless '2 2 2 ' 0 3  PHQ - 2 Score '5 4 4 ' 0 6  Altered sleeping 3 - '3 3 3  ' Tired, decreased energy 3 - '3 3 3  ' Change in appetite 0 - 2 - 2  Feeling bad or failure about yourself  0 - 0 0 2  Trouble concentrating 3 - 1 0 3  Moving slowly or fidgety/restless 0 - 0 0 3  Suicidal thoughts 0 - 0 0 0  PHQ-9 Score 14 - '13 6 22  ' Difficult doing work/chores Somewhat difficult - Somewhat difficult Not difficult at all Somewhat difficult  Some recent data might be hidden   DIABETIC FOOT ULCER Patient is seeing a foot specialist for her diabetic ulcer. Her foot is improving.  But her legs stay red and swollen.  She has follow up with the vascular doctor coming up.   The patient does not have a history of falls. I did complete a risk assessment for falls. A plan of care for falls was documented.   Past Medical History:  Past Medical History:  Diagnosis Date   Arthritis    Asthma    COPD (chronic obstructive pulmonary disease) ([place])    Hypertension    Neuropathy    Pericarditis    Personal history of urinary calculi 11/29/2014   Thyroid disease     Surgical  History:  Past Surgical History:  Procedure Laterality Date   APPENDECTOMY     arm surgery Left    pins   fatty tumor removed from back     JOINT REPLACEMENT Right    LEG SURGERY Bilateral    OVARIAN CYST SURGERY Right    took piece of right ovary due to cyst   TOTAL HIP ARTHROPLASTY Right    wisdom teeth removed      Medications:  Current Outpatient Medications on File Prior to Visit  Medication Sig   Accu-Chek Softclix Lancets lancets CHECK BLOOD SUGAR TWICE DAILY    albuterol (PROVENTIL) (2.5 MG/3ML) 0.083% nebulizer solution INHALE CONTENTS OF 1 VIAL IN NEBULIZER EVERY 4 HOURS AS NEEDED   amLODipine (NORVASC) 5 MG tablet TAKE 1 TABLET BY MOUTH ONCE DAILY   atorvastatin (LIPITOR) 80 MG tablet Take 1 tablet (80 mg total) by mouth daily.   Blood Glucose Monitoring Suppl (ACCU-CHEK GUIDE) w/Device KIT Use to check blood sugar 3 times a day.   carvedilol (COREG) 3.125 MG tablet Take 1 tablet (3.125 mg total) by mouth 2 (two) times daily with a meal.   EPINEPHrine 0.3 mg/0.3 mL IJ SOAJ injection USE AS DIRECTED.   furosemide (LASIX) 20 MG tablet TAKE 1 TABLET BY MOUTH ONCE DAILY IN AFTERNOON AS NEEDED LEG EDEMA   furosemide (LASIX) 40 MG tablet Take 1 tablet (40 mg total) by mouth daily.   gabapentin (NEURONTIN) 300 MG capsule TAKE 1 CAPSULE BY MOUTH 3 TIMES DAILY ASNEEDED (NEED OFFICE VISIT FOR FURTHER REFILLS)   glucose blood (ACCU-CHEK GUIDE) test strip USE TO CHECK BLOOD SUGAR 3 TIMES DAILY AS DIRECTED   levothyroxine (SYNTHROID) 50 MCG tablet TAKE 1 TABLET BY MOUTH ONCE DAILY ON AN EMPTY STOMACH. WAIT 30 MINUTES BEFORE TAKING OTHER MEDS.   lisinopril (ZESTRIL) 40 MG tablet TAKE 1 TABLET BY MOUTH ONCE DAILY   meloxicam (MOBIC) 15 MG tablet TAKE 1 TABLET BY MOUTH ONCE DAILY AS NEEDED WITH FOOD OR MILK   methocarbamol (ROBAXIN) 750 MG tablet TAKE 1-2 TABLETS BY MOUTH 3 TIMES DAILY AS NEEDED   mometasone-formoterol (DULERA) 100-5 MCG/ACT AERO INHALE 2 PUFFS BY MOUTH TWICE A DAY. RINSE MOUTH AFTER USE.   montelukast (SINGULAIR) 10 MG tablet Take 1 tablet (10 mg total) by mouth at bedtime.   nitroGLYCERIN (NITROSTAT) 0.4 MG SL tablet DISSOLVE 1 TABLET UNDER TONGUE AT ONSET OF CHEST PAIN. REPEAT IN 5 MIN IF NOT RESOLVED, MAX 3 DOSES. 911 IF NEEDED.   omeprazole (PRILOSEC) 20 MG capsule Take 1 capsule (20 mg total) by mouth daily.   QUEtiapine (SEROQUEL) 25 MG tablet TAKE 1 TABLET BY MOUTH AT BEDTIME   SPIRIVA HANDIHALER 18 MCG inhalation capsule INHALE CONTENTS  OF 1 CAPSULE ONCE DAILY   triamcinolone cream (KENALOG) 0.1 % APPLY 1 APPLICATION TOPICALLY TWICE DAILY   TRULICITY 1.61 WR/6.0AV SOPN INJECT 0.75MG SUBQ ONCE A WEEK   Vitamin D, Ergocalciferol, (DRISDOL) 1.25 MG (50000 UNIT) CAPS capsule TAKE 1 CAPSULE BY MOUTH EVERY 7 DAYS   No current facility-administered medications on file prior to visit.    Allergies:  Allergies  Allergen Reactions   Codeine Shortness Of Breath and Rash   Percocet [Oxycodone-Acetaminophen] Shortness Of Breath   Sulfa Antibiotics Shortness Of Breath and Rash   Aspirin Hives   Bee Venom     Bee stings   Darvon [Propoxyphene]     Darvocet-N 100   Latex    Oxycodone Nausea And Vomiting   Penicillins  Has patient had a PCN reaction causing immediate rash, facial/tongue/throat swelling, SOB or lightheadedness with hypotension: Unknown Has patient had a PCN reaction causing severe rash involving mucus membranes or skin necrosis: Unknown Has patient had a PCN reaction that required hospitalization: Unknown Has patient had a PCN reaction occurring within the last 10 years: Unknown If all of the above answers are "NO", then may proceed with Cephalosporin use.    Social History:  Social History   Socioeconomic History   Marital status: Married    Spouse name: Not on file   Number of children: Not on file   Years of education: Not on file   Highest education level: Not on file  Occupational History   Not on file  Tobacco Use   Smoking status: Every Day    Packs/day: 0.25    Years: 30.00    Pack years: 7.50    Types: Cigarettes   Smokeless tobacco: Never   Tobacco comments:    Patient quit smoking and recently started back-08/08/2019  Vaping Use   Vaping Use: Never used  Substance and Sexual Activity   Alcohol use: No    Alcohol/week: 0.0 standard drinks   Drug use: No   Sexual activity: Not on file  Other Topics Concern   Not on file  Social History Narrative   Not on file   Social  Determinants of Health   Financial Resource Strain: High Risk   Difficulty of Paying Living Expenses: Very hard  Food Insecurity: Food Insecurity Present   Worried About Charity fundraiser in the Last Year: Often true   Arboriculturist in the Last Year: Often true  Transportation Needs: Public librarian (Medical): Yes   Lack of Transportation (Non-Medical): Yes  Physical Activity: Inactive   Days of Exercise per Week: 0 days   Minutes of Exercise per Session: 0 min  Stress: Stress Concern Present   Feeling of Stress : To some extent  Social Connections: Moderately Integrated   Frequency of Communication with Friends and Family: More than three times a week   Frequency of Social Gatherings with Friends and Family: More than three times a week   Attends Religious Services: More than 4 times per year   Active Member of [employer] or Organizations: No   Attends Music therapist: Never   Marital Status: Married  Human resources officer Violence: Not At Risk   Fear of Current or Ex-Partner: No   Emotionally Abused: No   Physically Abused: No   Sexually Abused: No   Social History   Tobacco Use  Smoking Status Every Day   Packs/day: 0.25   Years: 30.00   Pack years: 7.50   Types: Cigarettes  Smokeless Tobacco Never  Tobacco Comments   Patient quit smoking and recently started back-08/08/2019   Social History   Substance and Sexual Activity  Alcohol Use No   Alcohol/week: 0.0 standard drinks    Family History:  Family History  Problem Relation Age of Onset   Seizures Sister        disorders    Past medical history, surgical history, medications, allergies, family history and social history reviewed with patient today and changes made to appropriate areas of the chart.   Review of Systems  Constitutional:  Negative for malaise/fatigue and weight loss.  Eyes:  Negative for blurred vision and double vision.  Respiratory:  Negative  for shortness of breath.   Cardiovascular:  Negative  for chest pain, palpitations and leg swelling.  Gastrointestinal:  Negative for constipation and diarrhea.  Neurological:  Negative for dizziness and headaches.  Psychiatric/Behavioral:  Negative for depression. The patient is not nervous/anxious.   All other ROS negative except what is listed above and in the HPI.      Objective:    BP (!) 152/60    Pulse 74    Temp 98.4 F (36.9 C) (Oral)    Wt (!) 331 lb (150.1 kg)    LMP 06/09/2017 (Approximate)    SpO2 96%    BMI 56.82 kg/m   Wt Readings from Last 3 Encounters:  06/04/21 (!) 331 lb (150.1 kg)  02/05/21 290 lb (131.5 kg)  11/20/20 290 lb (131.5 kg)    Physical Exam Vitals and nursing note reviewed.  Constitutional:      General: She is awake. She is not in acute distress.    Appearance: She is well-developed. She is obese. She is not ill-appearing.  HENT:     Head: Normocephalic and atraumatic.     Right Ear: Hearing, tympanic membrane, ear canal and external ear normal. No drainage.     Left Ear: Hearing, tympanic membrane, ear canal and external ear normal. No drainage.     Nose: Nose normal.     Right Sinus: No maxillary sinus tenderness or frontal sinus tenderness.     Left Sinus: No maxillary sinus tenderness or frontal sinus tenderness.     Mouth/Throat:     Mouth: Mucous membranes are moist.     Pharynx: Oropharynx is clear. Uvula midline. No pharyngeal swelling, oropharyngeal exudate or posterior oropharyngeal erythema.  Eyes:     General: Lids are normal.        Right eye: No discharge.        Left eye: No discharge.     Extraocular Movements: Extraocular movements intact.     Conjunctiva/sclera: Conjunctivae normal.     Pupils: Pupils are equal, round, and reactive to light.     Visual Fields: Right eye visual fields normal and left eye visual fields normal.  Neck:     Thyroid: No thyromegaly.     Vascular: No carotid bruit.     Trachea: Trachea normal.   Cardiovascular:     Rate and Rhythm: Normal rate and regular rhythm.     Heart sounds: Normal heart sounds. No murmur heard.   No gallop.  Pulmonary:     Effort: Pulmonary effort is normal. No accessory muscle usage or respiratory distress.     Breath sounds: Normal breath sounds.  Chest:  Breasts:    Right: Normal.     Left: Normal.  Abdominal:     General: Bowel sounds are normal.     Palpations: Abdomen is soft. There is no hepatomegaly or splenomegaly.     Tenderness: There is no abdominal tenderness.  Musculoskeletal:        General: Normal range of motion.     Cervical back: Normal range of motion and neck supple.     Right lower leg: Edema present.     Left lower leg: Edema present.  Lymphadenopathy:     Head:     Right side of head: No submental, submandibular, tonsillar, preauricular or posterior auricular adenopathy.     Left side of head: No submental, submandibular, tonsillar, preauricular or posterior auricular adenopathy.     Cervical: No cervical adenopathy.     Upper Body:     Right upper body: No supraclavicular,  axillary or pectoral adenopathy.     Left upper body: No supraclavicular, axillary or pectoral adenopathy.  Skin:    General: Skin is warm and dry.     Capillary Refill: Capillary refill takes less than 2 seconds.     Coloration: Skin is ashen.     Findings: Erythema present. No rash.  Neurological:     Mental Status: She is alert and oriented to person, place, and time.     Gait: Gait is intact.     Deep Tendon Reflexes: Reflexes are normal and symmetric.     Reflex Scores:      Brachioradialis reflexes are 2+ on the right side and 2+ on the left side.      Patellar reflexes are 2+ on the right side and 2+ on the left side. Psychiatric:        Attention and Perception: Attention normal.        Mood and Affect: Mood normal.        Speech: Speech normal.        Behavior: Behavior normal. Behavior is cooperative.        Thought Content: Thought  content normal.        Judgment: Judgment normal.    Results for orders placed or performed in visit on 06/04/21  Microscopic Examination   Urine  Result Value Ref Range   WBC, UA None seen 0 - 5 /hpf   RBC 0-2 0 - 2 /hpf   Epithelial Cells (non renal) 0-10 0 - 10 /hpf   Bacteria, UA Moderate (A) None seen/Few  Urinalysis, Routine w reflex microscopic  Result Value Ref Range   Specific Gravity, UA 1.025 1.005 - 1.030   pH, UA 5.5 5.0 - 7.5   Color, UA Yellow Yellow   Appearance Ur Cloudy (A) Clear   Leukocytes,UA Negative Negative   Protein,UA Negative Negative/Trace   Glucose, UA Negative Negative   Ketones, UA Negative Negative   RBC, UA Trace (A) Negative   Bilirubin, UA Negative Negative   Urobilinogen, Ur 0.2 0.2 - 1.0 mg/dL   Nitrite, UA Negative Negative   Microscopic Examination See below:       Assessment & Plan:   Problem List Items Addressed This Visit       Cardiovascular and Mediastinum   Essential hypertension    Chronic. Not well controlled.  Will add Hydralazine 50m TID to help bring down blood pressure. Discussed side effects and benefits of medication with patient during visit.  Follow up in 1 month for reevaluation.  Keep tracking blood pressures at home.  Call sooner if concerns arise.       Relevant Medications   hydrALAZINE (APRESOLINE) 10 MG tablet     Respiratory   COPD (chronic obstructive pulmonary disease) ([facility])    Chronic. Continues to have SOB.  Would benefit from daily inhaler.  Still using Albuterol TID.  Discussed how to properly use them Medication.  Follow up in 1 month.        Endocrine   Adult hypothyroidism    Labs ordered today. Will make recommendations based on lab results.       IFG (impaired fasting glucose)    Labs ordered today. Will make recommendations based on lab results.       Relevant Orders   HgB A1c     Other   Clinical depression    Chronic. Not well controlled.  Will increase Celexa to 494mdaily.   Phq9 and GAD7 elevated  at visit today.  She has been stressed over her living situation.  Follow up in 1 month for reevaluation.      Relevant Medications   citalopram (CELEXA) 40 MG tablet   HLD (hyperlipidemia)    Labs ordered today. Will make recommendations based on lab results.       Relevant Medications   hydrALAZINE (APRESOLINE) 10 MG tablet   Other Relevant Orders   Lipid panel   Morbid obesity ([place])    Recommend a healthy lifestyle through diet.  Recommend decreasing carbohydrate and fat intake.  Recommend small meals that prioritize protein.       Other Visit Diagnoses     Annual physical exam    -  Primary   Health maintenance reviewed during visit. Labs ordered today. Mammogram ordered.    Relevant Orders   HgB A1c   CBC with Differential/Platelet   Comprehensive metabolic panel   Lipid panel   TSH   Urinalysis, Routine w reflex microscopic (Completed)   Encounter for screening mammogram for malignant neoplasm of breast       Relevant Orders   MM 3D SCREEN BREAST BILATERAL        Follow up plan: Return in about 1 month (around 07/02/2021) for BP Check.   LABORATORY TESTING:  - Pap smear:  Not up to date  IMMUNIZATIONS:   - Tdap: Tetanus vaccination status reviewed: last tetanus booster within 10 years. - Influenza: Administered today - Pneumovax: Up to date - Prevnar: Not applicable - COVID: Up to date - HPV: Not applicable - Shingrix vaccine:  Will give at next visit  SCREENING: -Mammogram: Ordered today  - Colonoscopy:  will need to get soon   - Bone Density: Not applicable  -Hearing Test: Not applicable  -Spirometry: Not applicable   PATIENT COUNSELING:   Advised to take 1 mg of folate supplement per day if capable of pregnancy.   Sexuality: Discussed sexually transmitted diseases, partner selection, use of condoms, avoidance of unintended pregnancy  and contraceptive alternatives.   Advised to avoid cigarette smoking.  I discussed with  the patient that most people either abstain from alcohol or drink within safe limits (<=14/week and <=4 drinks/occasion for males, <=7/weeks and <= 3 drinks/occasion for females) and that the risk for alcohol disorders and other health effects rises proportionally with the number of drinks per week and how often a drinker exceeds daily limits.  Discussed cessation/primary prevention of drug use and availability of treatment for abuse.   Diet: Encouraged to adjust caloric intake to maintain  or achieve ideal body weight, to reduce intake of dietary saturated fat and total fat, to limit sodium intake by avoiding high sodium foods and not adding table salt, and to maintain adequate dietary potassium and calcium preferably from fresh fruits, vegetables, and low-fat dairy products.    stressed the importance of regular exercise  Injury prevention: Discussed safety belts, safety helmets, smoke detector, smoking near bedding or upholstery.   Dental health: Discussed importance of regular tooth brushing, flossing, and dental visits.    NEXT PREVENTATIVE PHYSICAL DUE IN 1 YEAR. Return in about 1 month (around 07/02/2021) for BP Check.

## 2021-06-04 NOTE — Assessment & Plan Note (Signed)
Recommend a healthy lifestyle through diet.  Recommend decreasing carbohydrate and fat intake.  Recommend small meals that prioritize protein.

## 2021-06-04 NOTE — Assessment & Plan Note (Signed)
Chronic. Not well controlled.  Will increase Celexa to 40mg  daily.  Phq9 and GAD7 elevated at visit today.  She has been stressed over her living situation.  Follow up in 1 month for reevaluation.

## 2021-06-05 ENCOUNTER — Telehealth: Payer: Self-pay | Admitting: Nurse Practitioner

## 2021-06-05 ENCOUNTER — Other Ambulatory Visit: Payer: Self-pay

## 2021-06-05 DIAGNOSIS — Z1211 Encounter for screening for malignant neoplasm of colon: Secondary | ICD-10-CM

## 2021-06-05 LAB — COMPREHENSIVE METABOLIC PANEL
ALT: 18 IU/L (ref 0–32)
AST: 16 IU/L (ref 0–40)
Albumin/Globulin Ratio: 1.4 (ref 1.2–2.2)
Albumin: 4 g/dL (ref 3.8–4.9)
Alkaline Phosphatase: 97 IU/L (ref 44–121)
BUN/Creatinine Ratio: 26 — ABNORMAL HIGH (ref 9–23)
BUN: 20 mg/dL (ref 6–24)
Bilirubin Total: 0.5 mg/dL (ref 0.0–1.2)
CO2: 21 mmol/L (ref 20–29)
Calcium: 9.1 mg/dL (ref 8.7–10.2)
Chloride: 102 mmol/L (ref 96–106)
Creatinine, Ser: 0.78 mg/dL (ref 0.57–1.00)
Globulin, Total: 2.9 g/dL (ref 1.5–4.5)
Glucose: 89 mg/dL (ref 70–99)
Potassium: 4.4 mmol/L (ref 3.5–5.2)
Sodium: 140 mmol/L (ref 134–144)
Total Protein: 6.9 g/dL (ref 6.0–8.5)
eGFR: 90 mL/min/{1.73_m2} (ref >59)

## 2021-06-05 LAB — CBC WITH DIFFERENTIAL/PLATELET
Basophils Absolute: 0.1 10*3/uL (ref 0.0–0.2)
Basos: 1 %
EOS (ABSOLUTE): 0.2 10*3/uL (ref 0.0–0.4)
Eos: 2 %
Hematocrit: 38.5 % (ref 34.0–46.6)
Hemoglobin: 13.1 g/dL (ref 11.1–15.9)
Immature Grans (Abs): 0 10*3/uL (ref 0.0–0.1)
Immature Granulocytes: 0 %
Lymphocytes Absolute: 1.8 10*3/uL (ref 0.7–3.1)
Lymphs: 21 %
MCH: 30 pg (ref 26.6–33.0)
MCHC: 34 g/dL (ref 31.5–35.7)
MCV: 88 fL (ref 79–97)
Monocytes Absolute: 0.6 10*3/uL (ref 0.1–0.9)
Monocytes: 7 %
Neutrophils Absolute: 5.8 10*3/uL (ref 1.4–7.0)
Neutrophils: 69 %
Platelets: 355 10*3/uL (ref 150–450)
RBC: 4.36 x10E6/uL (ref 3.77–5.28)
RDW: 14.2 % (ref 11.7–15.4)
WBC: 8.5 10*3/uL (ref 3.4–10.8)

## 2021-06-05 LAB — HEMOGLOBIN A1C
Est. average glucose Bld gHb Est-mCnc: 123 mg/dL
Hgb A1c MFr Bld: 5.9 % — ABNORMAL HIGH (ref 4.8–5.6)

## 2021-06-05 LAB — LIPID PANEL
Chol/HDL Ratio: 3.3 ratio (ref 0.0–4.4)
Cholesterol, Total: 114 mg/dL (ref 100–199)
HDL: 35 mg/dL — ABNORMAL LOW (ref >39)
LDL Chol Calc (NIH): 62 mg/dL (ref 0–99)
Triglycerides: 85 mg/dL (ref 0–149)
VLDL Cholesterol Cal: 17 mg/dL (ref 5–40)

## 2021-06-05 LAB — TSH: TSH: 1.9 u[IU]/mL (ref 0.450–4.500)

## 2021-06-05 NOTE — Telephone Encounter (Signed)
Called and spoke to patient. Lab results picked up by the patient's husband. Colguard ordered.

## 2021-06-05 NOTE — Progress Notes (Signed)
Please let patient know that her lab work looks great.  A1c remains well controlled at 5.9.  Her cholesterol remains well controlled. Liver, kidneys, and electrolytes look good.  Her urine was slightly abnormal.  I will let her know if she needs antibiotics once the urine culture is back.  I will see her at our next visit.

## 2021-06-05 NOTE — Telephone Encounter (Signed)
Copied from CRM [id number]. Topic: General - Inquiry >> Jun 05, 2021  8:45 AM Wyonia Hough E wrote: Reason for CRM: Pt would like to do a cologuard /Pt states that what she did last time /pt also asked questions about if she will be covered for shingles and flu vaccines at the pharmacy  /  pts husband will also be coming to office to get copy of his and her lab results / please advise

## 2021-06-06 ENCOUNTER — Telehealth: Payer: Self-pay | Admitting: Nurse Practitioner

## 2021-06-06 LAB — URINE CULTURE

## 2021-06-06 NOTE — Telephone Encounter (Signed)
Patient notified of lab results. See result message.

## 2021-06-06 NOTE — Telephone Encounter (Signed)
Copied from CRM [id number]. Topic: General - Other >> Jun 06, 2021  1:36 PM Gaetana Michaelis A wrote: Reason for CRM: The patient would like to be contacted by a member of clinical staff when possible about the results of their urinalysis   Please contact further when possible

## 2021-06-06 NOTE — Progress Notes (Signed)
Please let patient know there was no growth on her urine culture.  No need for further treatment.

## 2021-06-09 ENCOUNTER — Ambulatory Visit: Payer: Self-pay | Admitting: *Deleted

## 2021-06-09 NOTE — Telephone Encounter (Signed)
Summary: vaccines general  and covid time and combining   Pt recently in to see dr and told multiple shots and vaccines needed, wants to know what she should get/ when and not get together if including covid vaccines. Fu [phone]      Reason for Disposition  COVID-19 vaccine, Frequently Asked Questions (FAQs)  Answer Assessment - Initial Assessment Questions 1. MAIN CONCERN OR SYMPTOM:  "What is your main concern right now?" "What question do you have?" "What's the main symptom you're worried about?" (e.g., fever, pain, redness, swelling)     Can patient take different vaccine at the same time as COVID vaccine 2. VACCINE: "What vaccination did you receive?" (e.g., none; AstraZeneca, J&J, Moderna, Pfizer, other) "Is this your first, second shot, or booster?" (e.g., first, second, booster)     na 3. SYMPTOM ONSET: "When did the symptom begin?" (e.g., not relevant; hours, days)      na 4. SYMPTOM SEVERITY: "How bad is it?"      *No Answer* 5. FEVER: "Is there a fever?" If Yes, ask: "What is it, how was it measured, and when did it start?"      *No Answer* 6. PAST REACTIONS: "Have you reacted to immunizations before?" If Yes, ask: "What happened?"     *No Answer* 7. OTHER SYMPTOMS: "Do you have any other symptoms?"     *No Answer*  Protocols used: Coronavirus (COVID-19) Vaccine Questions and Reactions-A-AH

## 2021-06-09 NOTE — Telephone Encounter (Signed)
Per CDC: There is no recommended waiting period between getting a COVID- 19 vaccine and other vaccines.

## 2021-06-10 ENCOUNTER — Other Ambulatory Visit: Payer: Self-pay

## 2021-06-11 DIAGNOSIS — Z1211 Encounter for screening for malignant neoplasm of colon: Secondary | ICD-10-CM | POA: Diagnosis not present

## 2021-06-11 NOTE — Patient Instructions (Signed)
Visit Information  [name] was given information about Medicaid Managed Care team care coordination services as a part of their [facility] Medicaid benefit. Cheryl Chandler verbally consented to engagement with the [facility] Managed Care team.   If you are experiencing a medical emergency, please call 911 or report to your local emergency department or urgent care.   If you have a non-emergency medical problem during routine business hours, please contact your provider's office and ask to speak with a nurse.   For questions related to your [facility] health plan, please call: [phone] or go here:[email or web address]  If you would like to schedule transportation through your [facility] plan, please call the following number at least 2 days in advance of your appointment: [phone].  Call the [place] at [phone], at any time, 24 hours a day, 7 days a week. If you are in danger or need immediate medical attention call 911.  If you would like help to quit smoking, call 1-800-QUIT-NOW 413-270-8427) OR Espaol: 1-855-Djelo-Ya QO:409462) o para ms informacin haga clic aqu or Text READY to 200-400 to register via text  [name] - following are the goals we discussed in your visit today:   Goals Addressed   None    The patient verbalized understanding of instructions, educational materials, and care plan provided today and declined offer to receive copy of patient instructions, educational materials, and care plan.   Appointments:  06/20/21 Social Worker 06/23/21 Care Manager 07/22/21 Pharmacist 07/02/21 PCP  Hughes Better PharmD, CPP High Risk Managed Medicaid [place] [phone]  Following is a copy of your plan of care:  Care Plan : Medication Management  Updates made by Hughes Better, RPH-CPP since 06/11/2021 12:00 AM  Completed 06/11/2021   Problem: Health Promotion or Disease Self-Management  (General Plan of Care) Resolved 06/11/2021    Care Plan : General Pharmacy (Adult)  Updates made by Hughes Better, RPH-CPP since 06/11/2021 12:00 AM     Problem: Chronic Disease Management   Priority: High     Long-Range Goal: Managing Chronic Disease Therapies   Start Date: 05/02/2021  Expected End Date: 07/31/2021  Recent Progress: On track  Priority: High  Note:   Current Barriers:  Unable to achieve control of HTN and pain  Suboptimal therapeutic regimen for T2DM Unable to quit smoking   Pharmacist Clinical Goal(s):  patient will achieve improvement in HTN and pain as evidenced by improvement in BP readings and pain score adhere to plan to optimize therapeutic regimen for T2DM as evidenced by report of adherence to recommended medication management changes Patient will reduce number of cigarettes consumed daily  through collaboration with PharmD and provider.    Interventions: Inter-disciplinary care team collaboration (see longitudinal plan of care) Comprehensive medication review performed; medication list updated in electronic medical record  Pre-diabetes/weight  Controlled; current treatment: Trulicity 0.75mg  (has not been increased as of yet)  Current random glucose readings: mid-high 100's  Denieshypoglycemic/hyperglycemic symptoms  Collaborated with PCP to recommend titration to Trulicity 1.5mg  once weekly for additional weight loss benefits    Hypertension:  Uncontrolled; current treatment: amlodipine, lisinopril 40mg  daily, carvedilol 3.125mg  twice daily,  hydralazine 10mg  three times daily (just started around 1 week ago)  Current home readings: 175/75, 185/80  Denies hypotensive/hypertensive symptoms  Educated on importance for patient to keep appointment with PCP to discuss BP treatment in one month and continue taking medications  Tobacco Abuse:  1/2 packs per day; 30 years of use; does smoke  within 30 minutes of waking up  Previous quit attempts:  successful on her own  Triggers to smoke: stress  Motivation to quit smoking: health  On a scale of 1-10, how IMPORTANT is it for you to quit smoking: 8  On a scale of 1-10, how CONFIDENT are you that you can quit smoking: 10  Counseled on 1-800 QUIT NOW and option to speak further on this topic but patient denies help and would like to do it on her own  Patient Goals/Self-Care Activities patient will:  - check glucose daily, document, and provide at future appointments check blood pressure daily, document, and provide at future appointments -work towards smoking cessation  Follow Up Plan: Telephone follow up appointment with care management team member scheduled for: 06/20/21 Social Worker, 06/23/21 Care Manager, 07/22/21 Pharmacist Next PCP appointment scheduled for:  07/02/21

## 2021-06-11 NOTE — Patient Instructions (Signed)
Visit Information  Cheryl Chandler was given information about Medicaid Managed Care team care coordination services as a part of their [facility] Medicaid benefit. Cheryl Chandler verbally consented to engagement with the [facility] Managed Care team.   If you are experiencing a medical emergency, please call 911 or report to your local emergency department or urgent care.   If you have a non-emergency medical problem during routine business hours, please contact your provider's office and ask to speak with a nurse.   For questions related to your [facility] health plan, please call: [phone] or go here:[email or web address]  If you would like to schedule transportation through your [facility] plan, please call the following number at least 2 days in advance of your appointment: [phone].  Call the [place] at [phone], at any time, 24 hours a day, 7 days a week. If you are in danger or need immediate medical attention call 911.  If you would like help to quit smoking, call 1-800-QUIT-NOW 3657111513) OR Espaol: 1-855-Djelo-Ya QO:409462) o para ms informacin haga clic aqu or Text READY to 200-400 to register via text  Cheryl Chandler - following are the goals we discussed in your visit today:   Goals Addressed   None     The patient verbalized understanding of instructions, educational materials, and care plan provided today and declined offer to receive copy of patient instructions, educational materials, and care plan.   RN Care Manager will call on 1/2/273 Social Worker will 05/21/21.  Pharmacist will call on 06/10/21 Next PCP appointment:  06/04/21  Hughes Better PharmD, CPP High Risk Managed Medicaid [place] [phone]   Following is a copy of your plan of care:  Care Plan : General Pharmacy (Adult)  Updates made by Hughes Better, RPH-CPP since 06/11/2021 12:00 AM     Problem: Chronic Disease  Management   Priority: High     Long-Range Goal: Managing Chronic Disease Therapies   Start Date: 05/02/2021  Expected End Date: 07/31/2021  This Visit's Progress: On track  Priority: High  Note:   Current Barriers:  Unable to achieve control of HTN and pain  Suboptimal therapeutic regimen for T2DM   Pharmacist Clinical Goal(s):  patient will achieve improvement in HTN and pain as evidenced by improvement in BP readings and pain score adhere to plan to optimize therapeutic regimen for T2DM as evidenced by report of adherence to recommended medication management changes through collaboration with PharmD and provider.    Interventions: Inter-disciplinary care team collaboration (see longitudinal plan of care) Comprehensive medication review performed; medication list updated in electronic medical record  Pre-Diabetes./weight  Controlled current treatment: Trulicity 0.75mg ;   Current glucose readings: mid-high 100's  Denieshypoglycemic/hyperglycemic symptoms  Collaborated with PCP to recommend titration to Trulicity 1.5mg  once weekly    Hypertension:  Uncontrolled; current treatment: amlodipine, lisinopril 40mg  daily, carvedilol 3.125mg  twice daily;   Current home readings: 160's-190's/60-90's  Denies hypotensive/hypertensive symptoms  Educated on importance for patient to keep appointment with PCP to discuss BP treatment  Pain Uncontrolled; Current therapy: Meloxicam 15mg  daily, gabapentin 300mg  three times daily Rates pain 8/10 Recommend patient discuss alternatives to pain management with PCP including titration of gabapentin  Patient Goals/Self-Care Activities patient will:  - check glucose daily, document, and provide at future appointments check blood pressure daily, document, and provide at future appointments  Follow Up Plan: Telephone follow up appointment with care management team member scheduled for: 05/21/21 Social Worker, 05/23/21 Care Manger, 06/10/21  Pharmacist Next  PCP appointment scheduled for:  06/04/21

## 2021-06-11 NOTE — Patient Outreach (Signed)
Medicaid Managed Care Pharmacy Note  06/11/2021 Name:  Cheryl Chandler MRN:  [id number] DOB:  1966/04/06  Cheryl Chandler is an 56 y.o. year old female who is a primary patient of [name], NP.  The [facility] Managed Care Coordination team was consulted for assistance with disease management and care coordination needs.    Engaged with patient by telephone for follow up visit in response to referral for case management and/or care coordination services.  [name] was given information about Medicaid Managed Care Coordination team services today. [place] Patient agreed to services and verbal consent obtained.  Objective:  Lab Results  Component Value Date   CREATININE 0.78 06/04/2021   CREATININE 0.74 02/05/2021   CREATININE 0.69 11/20/2020    Lab Results  Component Value Date   HGBA1C 5.9 (H) 06/04/2021       Component Value Date/Time   CHOL 114 06/04/2021 1432   TRIG 85 06/04/2021 1432   HDL 35 (L) 06/04/2021 1432   CHOLHDL 3.3 06/04/2021 1432   LDLCALC 62 06/04/2021 1432    BP Readings from Last 3 Encounters:  06/04/21 (!) 152/60  02/05/21 (!) 152/81  12/13/20 (!) 185/85    Care Plan  Allergies  Allergen Reactions   Codeine Shortness Of Breath and Rash   Percocet [Oxycodone-Acetaminophen] Shortness Of Breath   Sulfa Antibiotics Shortness Of Breath and Rash   Aspirin Hives   Bee Venom     Bee stings   Darvon [Propoxyphene]     Darvocet-N 100   Latex    Oxycodone Nausea And Vomiting   Penicillins     Has patient had a PCN reaction causing immediate rash, facial/tongue/throat swelling, SOB or lightheadedness with hypotension: Unknown Has patient had a PCN reaction causing severe rash involving mucus membranes or skin necrosis: Unknown Has patient had a PCN reaction that required hospitalization: Unknown Has patient had a PCN reaction occurring within the last 10 years: Unknown If all of the above answers are "NO", then  may proceed with Cephalosporin use.    Medications Reviewed Today     Reviewed by [name], NP (Nurse Practitioner) on 06/04/21 at 1402  Med List Status: <None>   Medication Order Taking? Sig Documenting Provider Last Dose Status Informant  Accu-Chek Softclix Lancets lancets [id number] Yes CHECK BLOOD SUGAR TWICE DAILY [name], NP Taking Active   albuterol (PROVENTIL) (2.5 MG/3ML) 0.083% nebulizer solution [id number] Yes INHALE CONTENTS OF 1 VIAL IN NEBULIZER EVERY 4 HOURS AS NEEDED [name], NP Taking Active   amLODipine (NORVASC) 5 MG tablet [id number] Yes TAKE 1 TABLET BY MOUTH ONCE DAILY [name], NP Taking Active   atorvastatin (LIPITOR) 80 MG tablet [id number] Yes Take 1 tablet (80 mg total) by mouth daily. [name], NP Taking Active   Blood Glucose Monitoring Suppl (ACCU-CHEK GUIDE) w/Device KIT [id number] Yes Use to check blood sugar 3 times a day. [name], NP Taking Active   carvedilol (COREG) 3.125 MG tablet [id number] Yes Take 1 tablet (3.125 mg total) by mouth 2 (two) times daily with a meal. [name], DO Taking Active   citalopram (CELEXA) 20 MG tablet [id number] Yes TAKE 1 TABLET BY MOUTH ONCE DAILY [name], NP Taking Active   EPINEPHrine 0.3 mg/0.3 mL IJ SOAJ injection [id number] Yes USE AS DIRECTED. [name], NP Taking Active   furosemide (LASIX) 20 MG tablet [id number] Yes TAKE 1 TABLET BY MOUTH ONCE DAILY IN AFTERNOON AS NEEDED LEG EDEMA [name], NP  Taking Active   furosemide (LASIX) 40 MG tablet [id number] Yes Take 1 tablet (40 mg total) by mouth daily. [name], PA-C Taking Active            Med Note [name] Oct 14, 2020  1:41 PM) Told to take in AM, not afternoon  gabapentin (NEURONTIN) 300 MG capsule [id number] Yes TAKE 1 CAPSULE BY MOUTH 3 TIMES DAILY ASNEEDED (NEED OFFICE VISIT FOR FURTHER REFILLS) [name], NP Taking Active   glucose blood (ACCU-CHEK  GUIDE) test strip [id number] Yes USE TO CHECK BLOOD SUGAR 3 TIMES DAILY AS DIRECTED [name], NP Taking Active   levothyroxine (SYNTHROID) 50 MCG tablet [id number] Yes TAKE 1 TABLET BY MOUTH ONCE DAILY ON AN EMPTY STOMACH. WAIT 30 MINUTES BEFORE TAKING OTHER MEDS. [name], NP Taking Active   lisinopril (ZESTRIL) 40 MG tablet [id number] Yes TAKE 1 TABLET BY MOUTH ONCE DAILY [name], NP Taking Active   meloxicam ([place]) 15 MG tablet [id number] Yes TAKE 1 TABLET BY MOUTH ONCE DAILY AS NEEDED WITH FOOD OR MILK [name], NP Taking Active   methocarbamol (ROBAXIN) 750 MG tablet [id number] Yes TAKE 1-2 TABLETS BY MOUTH 3 TIMES DAILY AS NEEDED [name], NP Taking Active   mometasone-formoterol (DULERA) 100-5 MCG/ACT AERO [id number] Yes INHALE 2 PUFFS BY MOUTH TWICE A DAY. RINSE MOUTH AFTER USE. [name], PA-C Taking Active   montelukast (SINGULAIR) 10 MG tablet [id number] Yes Take 1 tablet (10 mg total) by mouth at bedtime. [name], NP Taking Active   nitroGLYCERIN (NITROSTAT) 0.4 MG SL tablet [id number] Yes DISSOLVE 1 TABLET UNDER TONGUE AT ONSET OF CHEST PAIN. REPEAT IN 5 MIN IF NOT RESOLVED, MAX 3 DOSES. 911 IF NEEDED. [name], NP Taking Active   omeprazole (PRILOSEC) 20 MG capsule [id number] Yes Take 1 capsule (20 mg total) by mouth daily. [name], NP Taking Active   QUEtiapine (SEROQUEL) 25 MG tablet [id number] Yes TAKE 1 TABLET BY MOUTH AT BEDTIME [name], NP Taking Active   SPIRIVA HANDIHALER 18 MCG inhalation capsule [id number] Yes INHALE CONTENTS OF 1 CAPSULE ONCE DAILY [name], NP Taking Active   triamcinolone cream (KENALOG) 0.1 % [id number] Yes APPLY 1 APPLICATION TOPICALLY TWICE DAILY [name], NP Taking Active   TRULICITY 3.54 SF/6.8LE SOPN [id number] Yes INJECT 0.75MG SUBQ ONCE A [name], NP Taking Active   Vitamin D, Ergocalciferol, (DRISDOL) 1.25 MG (50000 UNIT) CAPS capsule  [id number] Yes TAKE 1 CAPSULE BY MOUTH EVERY 7 DAYS [name], NP Taking Active             Patient Active Problem List   Diagnosis Date Noted   Fibrocystic disease of both breasts 05/07/2019   IFG (impaired fasting glucose) 05/03/2019   Chronic venous insufficiency 11/09/2018   Overactive bladder 09/30/2018   Peripheral neuropathy 09/30/2018   Apnea 01/02/2016   Lymphedema 11/30/2014   Dyspnea on exertion 11/30/2014   Anxiety 11/29/2014   Arthritis 11/29/2014   COPD (chronic obstructive pulmonary disease) ([place]) 11/29/2014   Clinical depression 11/29/2014   H/O eating disorder 11/29/2014   Esophageal reflux 11/29/2014   Acne inversa 11/29/2014   Essential hypertension 11/29/2014   HLD (hyperlipidemia) 11/29/2014   Adult hypothyroidism 11/29/2014   Insomnia 11/29/2014   Low back pain 11/29/2014   Morbid obesity ([place]) 11/29/2014   Posttraumatic stress disorder 11/29/2014   Vitamin D deficiency 11/29/2014   Nicotine dependence, cigarettes, uncomplicated 63/84/6659    Conditions to be  addressed/monitored per PCP order:  HTN, Tobacco Use, and pre-diabetes and pain  Care Plan : General Pharmacy (Adult)  Updates made by Hughes Better, RPH-CPP since 06/11/2021 12:00 AM     Problem: Chronic Disease Management   Priority: High     Long-Range Goal: Managing Chronic Disease Therapies   Start Date: 05/02/2021  Expected End Date: 07/31/2021  Recent Progress: On track  Priority: High  Note:   Current Barriers:  Unable to achieve control of HTN and pain  Suboptimal therapeutic regimen for T2DM Unable to quit smoking   Pharmacist Clinical Goal(s):  patient will achieve improvement in HTN and pain as evidenced by improvement in BP readings and pain score adhere to plan to optimize therapeutic regimen for T2DM as evidenced by report of adherence to recommended medication management changes Patient will reduce number of cigarettes consumed daily  through collaboration  with PharmD and provider.    Interventions: Inter-disciplinary care team collaboration (see longitudinal plan of care) Comprehensive medication review performed; medication list updated in electronic medical record  Pre-diabetes/weight  Controlled; current treatment: Trulicity 5.17OH (has not been increased as of yet)  Current random glucose readings: mid-high 100's  Denieshypoglycemic/hyperglycemic symptoms  Collaborated with PCP to recommend titration to Trulicity 6.0VP once weekly for additional weight loss benefits    Hypertension:  Uncontrolled; current treatment: amlodipine, lisinopril 41m daily, carvedilol 3.1263mtwice daily,  hydralazine 1057mhree times daily (just started around 1 week ago)  Current home readings: 175/75, 185/80  Denies hypotensive/hypertensive symptoms  Educated on importance for patient to keep appointment with PCP to discuss BP treatment in one month and continue taking medications  Tobacco Abuse:  1/2 packs per day; 30 years of use; does smoke within 30 minutes of waking up  Previous quit attempts: successful on her own  Triggers to smoke: stress  Motivation to quit smoking: health  On a scale of 1-10, how IMPORTANT is it for you to quit smoking: 8  On a scale of 1-10, how CONFIDENT are you that you can quit smoking: 10  Counseled on 1-800 QUIT NOW and option to speak further on this topic but patient denies help and would like to do it on her own  Patient Goals/Self-Care Activities patient will:  - check glucose daily, document, and provide at future appointments check blood pressure daily, document, and provide at future appointments -work towards smoking cessation  Follow Up Plan: Telephone follow up appointment with care management team member scheduled for: 06/20/21 Social Worker, 06/23/21 Care Manager, 07/22/21 Pharmacist Next PCP appointment scheduled for:  07/02/21     RacHughes BetterarmD, CPP [facility] ([phone]

## 2021-06-11 NOTE — Patient Outreach (Signed)
Medicaid Managed Care Pharmacy Note  05/02/2021 Name:  Cheryl Chandler MRN:  [id number] DOB:  28-Feb-1966  Cheryl Chandler is an 56 y.o. year old female who is a primary patient of [name], NP.  The [facility] Managed Care Coordination team was consulted for assistance with disease management and care coordination needs.    Engaged with patient by telephone for follow up visit in response to referral for case management and/or care coordination services.  Cheryl Chandler was given information about Medicaid Managed Care Coordination team services today. [place] Patient agreed to services and verbal consent obtained.  Objective:  Lab Results  Component Value Date   CREATININE 0.78 06/04/2021   CREATININE 0.74 02/05/2021   CREATININE 0.69 11/20/2020    Lab Results  Component Value Date   HGBA1C 5.9 (H) 06/04/2021       Component Value Date/Time   CHOL 114 06/04/2021 1432   TRIG 85 06/04/2021 1432   HDL 35 (L) 06/04/2021 1432   CHOLHDL 3.3 06/04/2021 1432   LDLCALC 62 06/04/2021 1432    BP Readings from Last 3 Encounters:  06/04/21 (!) 152/60  02/05/21 (!) 152/81  12/13/20 (!) 185/85     Care Plan  Allergies  Allergen Reactions   Codeine Shortness Of Breath and Rash   Percocet [Oxycodone-Acetaminophen] Shortness Of Breath   Sulfa Antibiotics Shortness Of Breath and Rash   Aspirin Hives   Bee Venom     Bee stings   Darvon [Propoxyphene]     Darvocet-N 100   Latex    Oxycodone Nausea And Vomiting   Penicillins     Has patient had a PCN reaction causing immediate rash, facial/tongue/throat swelling, SOB or lightheadedness with hypotension: Unknown Has patient had a PCN reaction causing severe rash involving mucus membranes or skin necrosis: Unknown Has patient had a PCN reaction that required hospitalization: Unknown Has patient had a PCN reaction occurring within the last 10 years: Unknown If all of the above answers are "NO", then  may proceed with Cephalosporin use.    Medications Reviewed Today     Reviewed by [name], NP (Nurse Practitioner) on 06/04/21 at 1402  Med List Status: <None>   Medication Order Taking? Sig Documenting Provider Last Dose Status Informant  Accu-Chek Softclix Lancets lancets [id number] Yes CHECK BLOOD SUGAR TWICE DAILY [name], NP Taking Active   albuterol (PROVENTIL) (2.5 MG/3ML) 0.083% nebulizer solution [id number] Yes INHALE CONTENTS OF 1 VIAL IN NEBULIZER EVERY 4 HOURS AS NEEDED [name], NP Taking Active   amLODipine (NORVASC) 5 MG tablet [id number] Yes TAKE 1 TABLET BY MOUTH ONCE DAILY [name], NP Taking Active   atorvastatin (LIPITOR) 80 MG tablet [id number] Yes Take 1 tablet (80 mg total) by mouth daily. [name], NP Taking Active   Blood Glucose Monitoring Suppl (ACCU-CHEK GUIDE) w/Device KIT [id number] Yes Use to check blood sugar 3 times a day. [name], NP Taking Active   carvedilol (COREG) 3.125 MG tablet [id number] Yes Take 1 tablet (3.125 mg total) by mouth 2 (two) times daily with a meal. [name], DO Taking Active   citalopram (CELEXA) 20 MG tablet [id number] Yes TAKE 1 TABLET BY MOUTH ONCE DAILY [name], NP Taking Active   EPINEPHrine 0.3 mg/0.3 mL IJ SOAJ injection [id number] Yes USE AS DIRECTED. [name], NP Taking Active   furosemide (LASIX) 20 MG tablet [id number] Yes TAKE 1 TABLET BY MOUTH ONCE DAILY IN AFTERNOON AS NEEDED LEG EDEMA [name], NP  Taking Active   furosemide (LASIX) 40 MG tablet [id number] Yes Take 1 tablet (40 mg total) by mouth daily. [name], PA-C Taking Active            Med Note [name] Oct 14, 2020  1:41 PM) Told to take in AM, not afternoon  gabapentin (NEURONTIN) 300 MG capsule [id number] Yes TAKE 1 CAPSULE BY MOUTH 3 TIMES DAILY ASNEEDED (NEED OFFICE VISIT FOR FURTHER REFILLS) [name], NP Taking Active   glucose blood (ACCU-CHEK  GUIDE) test strip [id number] Yes USE TO CHECK BLOOD SUGAR 3 TIMES DAILY AS DIRECTED [name], NP Taking Active   levothyroxine (SYNTHROID) 50 MCG tablet [id number] Yes TAKE 1 TABLET BY MOUTH ONCE DAILY ON AN EMPTY STOMACH. WAIT 30 MINUTES BEFORE TAKING OTHER MEDS. [name], NP Taking Active   lisinopril (ZESTRIL) 40 MG tablet [id number] Yes TAKE 1 TABLET BY MOUTH ONCE DAILY [name], NP Taking Active   meloxicam ([place]) 15 MG tablet [id number] Yes TAKE 1 TABLET BY MOUTH ONCE DAILY AS NEEDED WITH FOOD OR MILK [name], NP Taking Active   methocarbamol (ROBAXIN) 750 MG tablet [id number] Yes TAKE 1-2 TABLETS BY MOUTH 3 TIMES DAILY AS NEEDED [name], NP Taking Active   mometasone-formoterol (DULERA) 100-5 MCG/ACT AERO [id number] Yes INHALE 2 PUFFS BY MOUTH TWICE A DAY. RINSE MOUTH AFTER USE. [name], PA-C Taking Active   montelukast (SINGULAIR) 10 MG tablet [id number] Yes Take 1 tablet (10 mg total) by mouth at bedtime. [name], NP Taking Active   nitroGLYCERIN (NITROSTAT) 0.4 MG SL tablet [id number] Yes DISSOLVE 1 TABLET UNDER TONGUE AT ONSET OF CHEST PAIN. REPEAT IN 5 MIN IF NOT RESOLVED, MAX 3 DOSES. 911 IF NEEDED. [name], NP Taking Active   omeprazole (PRILOSEC) 20 MG capsule [id number] Yes Take 1 capsule (20 mg total) by mouth daily. [name], NP Taking Active   QUEtiapine (SEROQUEL) 25 MG tablet [id number] Yes TAKE 1 TABLET BY MOUTH AT BEDTIME [name], NP Taking Active   SPIRIVA HANDIHALER 18 MCG inhalation capsule [id number] Yes INHALE CONTENTS OF 1 CAPSULE ONCE DAILY [name], NP Taking Active   triamcinolone cream (KENALOG) 0.1 % [id number] Yes APPLY 1 APPLICATION TOPICALLY TWICE DAILY [name], NP Taking Active   TRULICITY 1.24 PY/0.9XI SOPN [id number] Yes INJECT 0.$RemoveBefo'75MG'CiTlhpTLhwU$  SUBQ ONCE A [name], NP Taking Active   Vitamin D, Ergocalciferol, (DRISDOL) 1.25 MG (50000 UNIT) CAPS capsule  [id number] Yes TAKE 1 CAPSULE BY MOUTH EVERY 7 DAYS [name], NP Taking Active             Patient Active Problem List   Diagnosis Date Noted   Fibrocystic disease of both breasts 05/07/2019   IFG (impaired fasting glucose) 05/03/2019   Chronic venous insufficiency 11/09/2018   Overactive bladder 09/30/2018   Peripheral neuropathy 09/30/2018   Apnea 01/02/2016   Lymphedema 11/30/2014   Dyspnea on exertion 11/30/2014   Anxiety 11/29/2014   Arthritis 11/29/2014   COPD (chronic obstructive pulmonary disease) ([place]) 11/29/2014   Clinical depression 11/29/2014   H/O eating disorder 11/29/2014   Esophageal reflux 11/29/2014   Acne inversa 11/29/2014   Essential hypertension 11/29/2014   HLD (hyperlipidemia) 11/29/2014   Adult hypothyroidism 11/29/2014   Insomnia 11/29/2014   Low back pain 11/29/2014   Morbid obesity ([place]) 11/29/2014   Posttraumatic stress disorder 11/29/2014   Vitamin D deficiency 11/29/2014   Nicotine dependence, cigarettes, uncomplicated 90/24/0973    Conditions to be  addressed/monitored per PCP order:  HTN and prediabetes, obesity, and Pain  Care Plan : General Pharmacy (Adult)  Updates made by Hughes Better, RPH-CPP since 06/11/2021 12:00 AM     Problem: Chronic Disease Management   Priority: High     Long-Range Goal: Managing Chronic Disease Therapies   Start Date: 05/02/2021  Expected End Date: 07/31/2021  This Visit's Progress: On track  Priority: High  Note:   Current Barriers:  Unable to achieve control of HTN and pain  Suboptimal therapeutic regimen for T2DM   Pharmacist Clinical Goal(s):  patient will achieve improvement in HTN and pain as evidenced by improvement in BP readings and pain score adhere to plan to optimize therapeutic regimen for T2DM as evidenced by report of adherence to recommended medication management changes through collaboration with PharmD and provider.    Interventions: Inter-disciplinary care team  collaboration (see longitudinal plan of care) Comprehensive medication review performed; medication list updated in electronic medical record  Pre-diabetes/weight  Controlled; current treatment: Trulicity 5.82PP;   Current random glucose readings: mid-high 100's  Denieshypoglycemic/hyperglycemic symptoms  Collaborated with PCP to recommend titration to Trulicity 8.9QM once weekly for additional weight loss benefits    Hypertension:  Uncontrolled; current treatment: amlodipine, lisinopril 68m daily, carvedilol 3.1232mtwice daily;   Current home readings: 160's-190's/60-90's  Denies hypotensive/hypertensive symptoms  Educated on importance for patient to keep appointment with PCP to discuss BP treatment  Pain Uncontrolled; Current therapy: Meloxicam 1584maily, gabapentin 300m73mree times daily Rates pain 8/10 Recommend patient discuss alternatives to pain management with PCP including titration of gabapentin  Patient Goals/Self-Care Activities patient will:  - check glucose daily, document, and provide at future appointments check blood pressure daily, document, and provide at future appointments  Follow Up Plan: Telephone follow up appointment with care management team member scheduled for: 05/21/21 Social Worker, 05/23/21 Care Manger, 06/10/21 Pharmacist Next PCP appointment scheduled for:  06/04/21       RachHughes BetterrmD, CPP [facility] ([phone]

## 2021-06-12 DIAGNOSIS — Z993 Dependence on wheelchair: Secondary | ICD-10-CM | POA: Diagnosis not present

## 2021-06-12 DIAGNOSIS — R3981 Functional urinary incontinence: Secondary | ICD-10-CM | POA: Diagnosis not present

## 2021-06-13 ENCOUNTER — Telehealth: Payer: Self-pay | Admitting: Nurse Practitioner

## 2021-06-13 NOTE — Telephone Encounter (Signed)
Copied from CRM [id number]. Topic: Appointment Scheduling - Scheduling Inquiry for Clinic >> Jun 06, 2021  5:37 PM Randol Kern wrote: Reason for CRM: Pt wants to get scheduled for some injections, she just has a few questions first for the clinic. Please advise   Best contact: [phone]

## 2021-06-16 NOTE — Telephone Encounter (Signed)
Patient wanted to know if she is allowed to have all of her required vaccines and follow up vaccines. Per [name], NP patient is allowed to have the vaccinations. Just prefers for the patient to have them separate and not with the Shingles vaccine. Patient verbalized understanding. Patient will give our office a call back when she is wanting to get scheduled for the "Nurse Only" visit. Patient states she and her husband will go to a local pharmacy to have COVID boosters performed.

## 2021-06-18 LAB — COLOGUARD: COLOGUARD: NEGATIVE

## 2021-06-18 NOTE — Progress Notes (Signed)
Please let patient know that her Cologuard is negative.  We will repeat it in 3 years.

## 2021-06-20 ENCOUNTER — Other Ambulatory Visit: Payer: Self-pay

## 2021-06-20 ENCOUNTER — Telehealth: Payer: Self-pay | Admitting: Nurse Practitioner

## 2021-06-20 NOTE — Telephone Encounter (Signed)
Pt called in stating if the Shingles Vaccine was covered by her insurance before coming in. Please advise.

## 2021-06-20 NOTE — Patient Instructions (Signed)
Visit Information  Cheryl Chandler was given information about Medicaid Managed Care team care coordination services as a part of their [facility] benefit. Cheryl Chandler verbally consented to engagement with the [facility].   If you are experiencing a medical emergency, please call 911 or report to your local emergency department or urgent care.   If you have a non-emergency medical problem during routine business hours, please contact your provider's office and ask to speak with a nurse.   For questions related to your [facility] Medicaid health plan, please call: [phone] or go here:[email or web address]  If you would like to schedule transportation through your [facility] Medicaid plan, please call the following number at least 2 days in advance of your appointment: [phone].  You can also use the MTM portal or MTM mobile app to manage your rides. For the portal, please go to [email or web address].  Call the [facility] Crisis Line at [phone], at any time, 24 hours a day, 7 days a week. If you are in danger or need immediate medical attention call 911.  If you would like help to quit smoking, call 1-800-QUIT-NOW (859-117-63331-574-142-0936) OR Espaol: 1-855-Djelo-Ya (2-440-102-7253(1-(762)791-4935) o para ms informacin haga clic aqu or Text READY to 664-403200-400 to register via text  Cheryl Chandler - following are the goals we discussed in your visit today:   Goals Addressed   None     Social Worker will follow up in 30-45 days.   Cheryl Chandler. Cheryl Chandler, BSW, MHA [facility]   [place]  High Risk Managed Medicaid Team  [phone]   Following is a copy of your plan of care:  Care Plan : General Social Work (Adult)  Updates made by Cheryl Chandler, Cheryl Chandler since 06/20/2021 12:00 AM     Problem: Coping Skills (General Plan of Care)      Long-Range Goal: Coping Skills Enhanced   Start Date: 11/07/2020  Expected End Date: 04/10/2021  Recent Progress: On  track  Priority: High  Note:   Timeframe:  Long-Range Goal Priority:  High Start Date:       11/07/20                      Expected End Date:  04/10/21  Current Barriers:  Financial constraints Mental Health Concerns  Social Isolation Limited education about mental health support resources that are available to her within the local area* Cognitive Deficits Lacks knowledge of community resource  Clinical Social Work Clinical Goal(s):  Over the next 90 days, client will work with SW to address concerns related to experiencing ongoing symptoms of depression, sadness and grief since the passing of her mother Over the next 90 days, patient will work with LCSW to address needs related to implementing appropriate self-care and depression management.   Interventions: Patient interviewed and appropriate assessments performed Provided mental health counseling with regards to patient's loss and ongoing stressors. Patient was very close with her mother who passed away a few years ago and she continues to experience symptoms of grief from this loss. Patient denies wanting LCSW to make a referral for grief counseling through [facility] again but was receptive to coping skill and resource education/information that was provided during session today. Patient reports decorating for the holidays helps her to connect with her mother as they did this together when she was alive.  Provided patient with information about managing depression within her daily life. Patient has ongoing grief symptoms as well. Patient states "I feel  worn down." Patient reports that she talks out loud to her mother's picture and this is very helpful to her. Grief support resource education provided again during session today.  Provided reflective listening and implemented appropriate interventions to help suppport patient and her emotional needs  Discussed plans with patient for ongoing care management follow up and provided patient  with direct contact information for [facility] team Advised patient to contact [facility] team with any urgent case management needs. Assisted patient/caregiver with obtaining information about health plan benefits LCSW completed past referral for family to have a wheelchair ramp built at their residence due to ongoing mobility concerns. Ramp has been successfully installed.  PCS actively involved with patient and providing ongoing services.  Positive reinforcement provided to patient for quitting smoking cigarettes. Coping skill review education provided for future triggers.  Brief self-care education provided to both patient and spouse. Patient confirms that she continues to go to food pantry for food insecurity. Resource information has been provided.  Patient reports ongoing pain and issues with mobility. Patient shares that her pain resides in her pains and legs. Emotional support and coping skill education provided. Patient confirms that her PCS aide continues to provide her with services. However, aide has to be reminded to put on her mask since patient is high risk. LCSW reviewed resources that [name] provided her with in the past.  Patient reports that she had a wonderful birthday spent with her family this past May. Patient reports receiving appropriate socialization. Patient reports that her spouse took her out to eat and gifted her with several things which was a nice surprise.  Patient confirms stable transportation arrangements for upcoming PCP appointment this week.  Patient repots that she is losing a lot of weight but is not sure how much she has lost as she does not have a scale. Patient reports that a goal of hers is to lose weight but she has not been intentionally losing weight at this time.  Patient reports that her aide [place] continues to assist her with PCS and is an excellent support within the home for her and spouse.  Patient planted a tomato plant and successfully grew over 5  tomatoes over the summer. Patient is very proud of this achievement as this was her first time planting a tomato plant and is now interested in picking up gardening/planting as a hobby. Patient reports that her aunt continues to be a positive support for her in her life and someone that she can rely on in the case of emergencies.  Patient was educated on healthy self-care skills and advised her to use these into her daily routine.  04/10/21- Update- Patient is agreeable to social work closure at this time as she still does not need medication management or counseling as she is managing her mental health well at this time. Patient talks to her younger sister everyday to gain emotional support. Patient says that her sister is into body building and helps to educate and remind her to do her own self-care. Patient is agreeable to contact [facility] LCSW directly if any social work concern arise. [facility] LCSW will complete case closure at this time.  Patient reports that she has been elevating her legs daily to elevate pain and swelling.  [facility] LCSW completed care coordination with Osa Craver on 01/15/21 and [facility] LCSW provided the following suggested resource-It is likely their best option given their loss of housing and lack of consistent income:  Research scientist (physical sciences)                                                                                                                                                                              (  Emergency Crisis Housing) [phone] Ext. 104 M-F, 8am - 4pm Patient was successfully approved for food stamps but is still experiencing financial concerns. She reports that she and her husband have no clothes that fit them and they are having to start using a string for their pants and patient only has one bra at this time. [facility] LCSW will send referral to [facility] BSW today for financial support.  BSW spoke with patient about resources needed. Patient states she does  receive SSI and was approved for foodstamps. Patient is familiar with all of the food pantries and other resources in [place]. BSW will assist patient with finding an eye doctor. Patient states she has not been in 10 years. BSW will research and put a list of eye doctors together that accept Medicaid in [facility]. 04/30/20: BSW contacted and spoke with patient, she stated she was not doing good because she found out that she has a new landlord and they are going up on her rent. BSW informed patient to make sure she informs her social workers at [employer] that they have an increase for foodstamps and Medicaid. Patient stated that she sleeps on the couch and is in need of a power recliner/lift chair. BSW will send patients PCP a message asking for a script to be sent. 05/21/21: BSW completed follow up telephone call with patient. She states she is unsure if their new landlord has received their January rent. Patient states she is also worried due to the new landlord making them pay rent online and on the 1st of the month. Patient states she is unable to get anyone on the phone. Patient states her rent will go up to 400 from 255 in March. 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 No other resources needed at this time.   06/20/21: BSW completed follow up telelphone call with patient. Patient states she is doing well. Patient states Rolene Arbour will cover all of Vaccines. Patient states no resources are needed at this time. Update- Patient reports experiencing additional grief lately. [facility] LCSW offered referral for berievement counseling but she declined. Managing Loss, Adult People experience loss in many different ways throughout their lives. Events such as moving, changing jobs, and losing friends can create a sense of loss. The loss may be as serious as a major health change, divorce, death of a pet, or death of a loved one. All of these types of  loss are likely to create a physical and emotional reaction known as grief. Grief is the result of a major change or an absence of something or someone that you count on. Grief is a normal reaction to loss. A variety of factors can affect your grieving experience, including: The nature of your loss. Your relationship to what or whom you lost. Your understanding of grief and how to manage it. Your support system. How to manage lifestyle changes Keep to your normal routine as much as possible. If you have trouble focusing or doing normal activities, it is acceptable to take some time away from your normal routine. Spend time with friends and loved ones. Eat a healthy diet, get plenty of sleep, and rest when you feel tired. How to recognize changes  The way that you deal with your grief will affect your ability to function as you normally do. When grieving, you may experience these changes: Numbness, shock, sadness, anxiety, anger, denial, and guilt. Thoughts about death. Unexpected crying. A  physical sensation of emptiness in your stomach. Problems sleeping and eating. Tiredness (fatigue). Loss of interest in normal activities. Dreaming about or imagining seeing the person who died. A need to remember what or whom you lost. Difficulty thinking about anything other than your loss for a period of time. Relief. If you have been expecting the loss for a while, you may feel a sense of relief when it happens. Follow these instructions at home:    Activity Express your feelings in healthy ways, such as: Talking with others about your loss. It may be helpful to find others who have had a similar loss, such as a support group. Writing down your feelings in a journal. Doing physical activities to release stress and emotional energy. Doing creative activities like painting, sculpting, or playing or listening to music. Practicing resilience. This is the ability to recover and adjust after facing  challenges. Reading some resources that encourage resilience may help you to learn ways to practice those behaviors. General instructions Be patient with yourself and others. Allow the grieving process to happen, and remember that grieving takes time. It is likely that you may never feel completely done with some grief. You may find a way to move on while still cherishing memories and feelings about your loss. Accepting your loss is a process. It can take months or longer to adjust. Keep all follow-up visits as told by your health care provider. This is important. Where to find support To get support for managing loss: Ask your health care provider for help and recommendations, such as grief counseling or therapy. Think about joining a support group for people who are managing a loss. Where to find more information You can find more information about managing loss from: American Society of Clinical Oncology: www.cancer.net American Psychological Association: DiceTournament.ca Contact a health care provider if: Your grief is extreme and keeps getting worse. You have ongoing grief that does not improve. Your body shows symptoms of grief, such as illness. You feel depressed, anxious, or lonely. Get help right away if: You have thoughts about hurting yourself or others. If you ever feel like you may hurt yourself or others, or have thoughts about taking your own life, get help right away. You can go to your nearest emergency department or call: Your local emergency services (911 in the [place] A suicide crisis helpline, such as the National Suicide Prevention Lifeline at (559)876-3622. This is open 24 hours a day. Summary Grief is the result of a major change or an absence of someone or something that you count on. Grief is a normal reaction to loss. The depth of grief and the period of recovery depend on the type of loss and your ability to adjust to the change and process your feelings. Processing  grief requires patience and a willingness to accept your feelings and talk about your loss with people who are supportive. It is important to find resources that work for you and to realize that people experience grief differently. There is not one grieving process that works for everyone in the same way. Be aware that when grief becomes extreme, it can lead to more severe issues like isolation, depression, anxiety, or suicidal thoughts. Talk with your health care provider if you have any of these issues. This information is not intended to replace advice given to you by your health care provider. Make sure you discuss any questions you have with your health care provider. Document Revised: 06/17/2018 Document Reviewed: 08/27/2016 Elsevier Patient  Education  [employer]2020 Elsevier Inc.  Help for Managing Grief   When you are experiencing grief, many resources and support are available to help you. You are not alone.    Grief Share ([email or web address]/):    [employer] [facility] [address] [place], [place]      [employer] [facility]  [phone] [address] [place]     [facility] [address] [place]      [facility] [address] [place], [place]  [phone]   [facility] [address] [place], [place] [phone]   [facility] [address] [place], [place]     [phone]   [facility] [address] [place], [place]     [phone]   If a Hospice or Palliative Care team has been involved in the care of your loved one, you may reach out to your contact there or locally, you may reach out to [facility]:    [facility]   [address] [place], [place] [id number] [phone] Patient was educated on healthy self-care as she admits she has little motivation to walk lately. PCS recommendation was suggested.    Patient Self Care Activities:  Attends all scheduled provider appointments Calls provider office for new concerns or questions   Please see past updates related to this goal by clicking on the "Past Updates" button in the selected goal

## 2021-06-20 NOTE — Telephone Encounter (Signed)
Called pt to let her know that she will have to contact her insurance company to see if it is covered. Pt understood.

## 2021-06-20 NOTE — Patient Outreach (Signed)
Medicaid Managed Care Social Work Note  06/20/2021 Name:  Cheryl Chandler MRN:  [id number] DOB:  08-28-1965  Cheryl Chandler is an 56 y.o. year old female who is a primary patient of [name], NP.  The Medicaid Managed Care Coordination team was consulted for assistance with:  Community Resources   Ms. Circle was given information about Medicaid Managed Care Coordination team services today. [place] Patient agreed to services and verbal consent obtained.  Engaged with patient  for by telephone forfollow up visit in response to referral for case management and/or care coordination services.   Assessments/Interventions:  Review of past medical history, allergies, medications, health status, including review of consultants reports, laboratory and other test data, was performed as part of comprehensive evaluation and provision of chronic care management services.  SDOH: (Social Determinant of Health) assessments and interventions performed: BSW completed follow up telelphone call with patient. Patient states she is doing well. Patient states [place] will cover all of Vaccines. Patient states no resources are needed at this time.  Advanced Directives Status:  Not addressed in this encounter.  Care Plan                 Allergies  Allergen Reactions   Codeine Shortness Of Breath and Rash   Percocet [Oxycodone-Acetaminophen] Shortness Of Breath   Sulfa Antibiotics Shortness Of Breath and Rash   Aspirin Hives   Bee Venom     Bee stings   Darvon [Propoxyphene]     Darvocet-N 100   Latex    Oxycodone Nausea And Vomiting   Penicillins     Has patient had a PCN reaction causing immediate rash, facial/tongue/throat swelling, SOB or lightheadedness with hypotension: Unknown Has patient had a PCN reaction causing severe rash involving mucus membranes or skin necrosis: Unknown Has patient had a PCN reaction that required hospitalization: Unknown Has patient  had a PCN reaction occurring within the last 10 years: Unknown If all of the above answers are "NO", then may proceed with Cephalosporin use.    Medications Reviewed Today     Reviewed by [name], NP (Nurse Practitioner) on 06/04/21 at 1402  Med List Status: <None>   Medication Order Taking? Sig Documenting Provider Last Dose Status Informant  Accu-Chek Softclix Lancets lancets [id number] Yes CHECK BLOOD SUGAR TWICE DAILY [name], NP Taking Active   albuterol (PROVENTIL) (2.5 MG/3ML) 0.083% nebulizer solution [id number] Yes INHALE CONTENTS OF 1 VIAL IN NEBULIZER EVERY 4 HOURS AS NEEDED [name], NP Taking Active   amLODipine (NORVASC) 5 MG tablet [id number] Yes TAKE 1 TABLET BY MOUTH ONCE DAILY [name], NP Taking Active   atorvastatin (LIPITOR) 80 MG tablet [id number] Yes Take 1 tablet (80 mg total) by mouth daily. [name], NP Taking Active   Blood Glucose Monitoring Suppl (ACCU-CHEK GUIDE) w/Device KIT [id number] Yes Use to check blood sugar 3 times a day. [name], NP Taking Active   carvedilol (COREG) 3.125 MG tablet [id number] Yes Take 1 tablet (3.125 mg total) by mouth 2 (two) times daily with a meal. [name], DO Taking Active   citalopram (CELEXA) 20 MG tablet [id number] Yes TAKE 1 TABLET BY MOUTH ONCE DAILY [name], NP Taking Active   EPINEPHrine 0.3 mg/0.3 mL IJ SOAJ injection [id number] Yes USE AS DIRECTED. [name], NP Taking Active   furosemide (LASIX) 20 MG tablet [id number] Yes TAKE 1 TABLET BY MOUTH ONCE DAILY IN AFTERNOON AS NEEDED LEG EDEMA [name], NP Taking Active  furosemide (LASIX) 40 MG tablet [id number] Yes Take 1 tablet (40 mg total) by mouth daily. [name], PA-C Taking Active            Med Note [name] Oct 14, 2020  1:41 PM) Told to take in AM, not afternoon  gabapentin (NEURONTIN) 300 MG capsule [id number] Yes TAKE 1 CAPSULE BY MOUTH 3 TIMES DAILY  ASNEEDED (NEED OFFICE VISIT FOR FURTHER REFILLS) [name], NP Taking Active   glucose blood (ACCU-CHEK GUIDE) test strip [id number] Yes USE TO CHECK BLOOD SUGAR 3 TIMES DAILY AS DIRECTED [name], NP Taking Active   levothyroxine (SYNTHROID) 50 MCG tablet [id number] Yes TAKE 1 TABLET BY MOUTH ONCE DAILY ON AN EMPTY STOMACH. WAIT 30 MINUTES BEFORE TAKING OTHER MEDS. [name], NP Taking Active   lisinopril (ZESTRIL) 40 MG tablet [id number] Yes TAKE 1 TABLET BY MOUTH ONCE DAILY [name], NP Taking Active   meloxicam ([place]) 15 MG tablet [id number] Yes TAKE 1 TABLET BY MOUTH ONCE DAILY AS NEEDED WITH FOOD OR MILK [name], NP Taking Active   methocarbamol (ROBAXIN) 750 MG tablet [id number] Yes TAKE 1-2 TABLETS BY MOUTH 3 TIMES DAILY AS NEEDED [name], NP Taking Active   mometasone-formoterol (DULERA) 100-5 MCG/ACT AERO [id number] Yes INHALE 2 PUFFS BY MOUTH TWICE A DAY. RINSE MOUTH AFTER USE. [name], PA-C Taking Active   montelukast (SINGULAIR) 10 MG tablet [id number] Yes Take 1 tablet (10 mg total) by mouth at bedtime. [name], NP Taking Active   nitroGLYCERIN (NITROSTAT) 0.4 MG SL tablet [id number] Yes DISSOLVE 1 TABLET UNDER TONGUE AT ONSET OF CHEST PAIN. REPEAT IN 5 MIN IF NOT RESOLVED, MAX 3 DOSES. 911 IF NEEDED. [name], NP Taking Active   omeprazole (PRILOSEC) 20 MG capsule [id number] Yes Take 1 capsule (20 mg total) by mouth daily. [name], NP Taking Active   QUEtiapine (SEROQUEL) 25 MG tablet [id number] Yes TAKE 1 TABLET BY MOUTH AT BEDTIME [name], NP Taking Active   SPIRIVA HANDIHALER 18 MCG inhalation capsule [id number] Yes INHALE CONTENTS OF 1 CAPSULE ONCE DAILY [name], NP Taking Active   triamcinolone cream (KENALOG) 0.1 % [id number] Yes APPLY 1 APPLICATION TOPICALLY TWICE DAILY [name], NP Taking Active   TRULICITY 2.63 FH/5.4TG SOPN [id number] Yes INJECT 0.75MG SUBQ ONCE A  [name], NP Taking Active   Vitamin D, Ergocalciferol, (DRISDOL) 1.25 MG (50000 UNIT) CAPS capsule [id number] Yes TAKE 1 CAPSULE BY MOUTH EVERY 7 DAYS [name], NP Taking Active             Patient Active Problem List   Diagnosis Date Noted   Fibrocystic disease of both breasts 05/07/2019   IFG (impaired fasting glucose) 05/03/2019   Chronic venous insufficiency 11/09/2018   Overactive bladder 09/30/2018   Peripheral neuropathy 09/30/2018   Apnea 01/02/2016   Lymphedema 11/30/2014   Dyspnea on exertion 11/30/2014   Anxiety 11/29/2014   Arthritis 11/29/2014   COPD (chronic obstructive pulmonary disease) ([place]) 11/29/2014   Clinical depression 11/29/2014   H/O eating disorder 11/29/2014   Esophageal reflux 11/29/2014   Acne inversa 11/29/2014   Essential hypertension 11/29/2014   HLD (hyperlipidemia) 11/29/2014   Adult hypothyroidism 11/29/2014   Insomnia 11/29/2014   Low back pain 11/29/2014   Morbid obesity ([place]) 11/29/2014   Posttraumatic stress disorder 11/29/2014   Vitamin D deficiency 11/29/2014   Nicotine dependence, cigarettes, uncomplicated 72/62/0355    Conditions to be addressed/monitored per PCP order:  community resources  Care Plan : General Social Work (Adult)  Updates made by Ethelda Chick since 06/20/2021 12:00 AM     Problem: Coping Skills (General Plan of Care)      Long-Range Goal: Coping Skills Enhanced   Start Date: 11/07/2020  Expected End Date: 04/10/2021  Recent Progress: On track  Priority: High  Note:   Timeframe:  Long-Range Goal Priority:  High Start Date:       11/07/20                      Expected End Date:  04/10/21  Current Barriers:  Financial constraints Mental Health Concerns  Social Isolation Limited education about mental health support resources that are available to her within the local area* Cognitive Deficits Lacks knowledge of community resource  Clinical Social Work Clinical Goal(s):   Over the next 90 days, client will work with SW to address concerns related to experiencing ongoing symptoms of depression, sadness and grief since the passing of her mother Over the next 90 days, patient will work with LCSW to address needs related to implementing appropriate self-care and depression management.   Interventions: Patient interviewed and appropriate assessments performed Provided mental health counseling with regards to patient's loss and ongoing stressors. Patient was very close with her mother who passed away a few years ago and she continues to experience symptoms of grief from this loss. Patient denies wanting LCSW to make a referral for grief counseling through [place] again but was receptive to coping skill and resource education/information that was provided during session today. Patient reports decorating for the holidays helps her to connect with her mother as they did this together when she was alive.  Provided patient with information about managing depression within her daily life. Patient has ongoing grief symptoms as well. Patient states "I feel worn down." Patient reports that she talks out loud to her mother's picture and this is very helpful to her. Grief support resource education provided again during session today.  Provided reflective listening and implemented appropriate interventions to help suppport patient and her emotional needs  Discussed plans with patient for ongoing care management follow up and provided patient with direct contact information for [facility] team Advised patient to contact [facility] team with any urgent case management needs. Assisted patient/caregiver with obtaining information about health plan benefits LCSW completed past referral for family to have a wheelchair ramp built at their residence due to ongoing mobility concerns. Ramp has been successfully installed.  PCS actively involved with patient and providing ongoing services.  Positive  reinforcement provided to patient for quitting smoking cigarettes. Coping skill review education provided for future triggers.  Brief self-care education provided to both patient and spouse. Patient confirms that she continues to go to food pantry for food insecurity. Resource information has been provided.  Patient reports ongoing pain and issues with mobility. Patient shares that her pain resides in her pains and legs. Emotional support and coping skill education provided. Patient confirms that her PCS aide continues to provide her with services. However, aide has to be reminded to put on her mask since patient is high risk. LCSW reviewed resources that [name] provided her with in the past.  Patient reports that she had a wonderful birthday spent with her family this past May. Patient reports receiving appropriate socialization. Patient reports that her spouse took her out to eat and gifted her with several things which was a nice surprise.  Patient confirms  stable transportation arrangements for upcoming PCP appointment this week.  Patient repots that she is losing a lot of weight but is not sure how much she has lost as she does not have a scale. Patient reports that a goal of hers is to lose weight but she has not been intentionally losing weight at this time.  Patient reports that her aide [name] continues to assist her with PCS and is an excellent support within the home for her and spouse.  Patient planted a tomato plant and successfully grew over 5 tomatoes over the summer. Patient is very proud of this achievement as this was her first time planting a tomato plant and is now interested in picking up gardening/planting as a hobby. Patient reports that her aunt continues to be a positive support for her in her life and someone that she can rely on in the case of emergencies.  Patient was educated on healthy self-care skills and advised her to use these into her daily routine.  04/10/21-  Update- Patient is agreeable to social work closure at this time as she still does not need medication management or counseling as she is managing her mental health well at this time. Patient talks to her younger sister everyday to gain emotional support. Patient says that her sister is into body building and helps to educate and remind her to do her own self-care. Patient is agreeable to contact [facility] LCSW directly if any social work concern arise. [facility] LCSW will complete case closure at this time.  Patient reports that she has been elevating her legs daily to elevate pain and swelling.  [facility] LCSW completed care coordination with Thomasene Lot on 01/15/21 and [facility] LCSW provided the following suggested resource-It is likely their best option given their loss of housing and lack of consistent income:  Publishing rights manager                                                                                                                                                                              (Emergency [place]) [phone] Ext. 104 M-F, 8am - 4pm Patient was successfully approved for food stamps but is still experiencing financial concerns. She reports that she and her husband have no clothes that fit them and they are having to start using a string for their pants and patient only has one bra at this time. [facility] LCSW will send referral to [facility] BSW today for financial support.  BSW spoke with patient about resources needed. Patient states she does receive SSI and was approved for foodstamps. Patient is familiar with all of the food pantries and other resources in [place]. BSW will assist patient with finding an  eye doctor. Patient states she has not been in 10 years. BSW will research and put a list of eye doctors together that accept Medicaid in [facility] 04/30/20: BSW contacted and spoke with patient, she stated she was not doing good because she found out that she has a new  landlord and they are going up on her rent. BSW informed patient to make sure she informs her social workers at [employer] that they have an increase for foodstamps and Medicaid. Patient stated that she sleeps on the couch and is in need of a power recliner/lift chair. BSW will send patients PCP a message asking for a script to be sent. 05/21/21: BSW completed follow up telephone call with patient. She states she is unsure if their new landlord has received their January rent. Patient states she is also worried due to the new landlord making them pay rent online and on the 1st of the month. Patient states she is unable to get anyone on the phone. Patient states her rent will go up to 400 from 255 in March. Patient states she does not have many clothes but goes to [employer] to get some Materials engineer. No other resources needed at this time.   06/20/21: BSW completed follow up telelphone call with patient. Patient states she is doing well. Patient states [employer] will cover all of Vaccines. Patient states no resources are needed at this time. Update- Patient reports experiencing additional grief lately. [facility] LCSW offered referral for berievement counseling but she declined. Managing Loss, Adult People experience loss in many different ways throughout their lives. Events such as moving, changing jobs, and losing friends can create a sense of loss. The loss may be as serious as a major health change, divorce, death of a pet, or death of a loved one. All of these types of loss are likely to create a physical and emotional reaction known as grief. Grief is the result of a major change or an absence of something or someone that you count on. Grief is a normal reaction to loss. A variety of factors can affect your grieving experience, including: The nature of your loss. Your relationship to what or whom you lost. Your understanding of grief and how to manage it. Your support system. How to manage lifestyle  changes Keep to your normal routine as much as possible. If you have trouble focusing or doing normal activities, it is acceptable to take some time away from your normal routine. Spend time with friends and loved ones. Eat a healthy diet, get plenty of sleep, and rest when you feel tired. How to recognize changes  The way that you deal with your grief will affect your ability to function as you normally do. When grieving, you may experience these changes: Numbness, shock, sadness, anxiety, anger, denial, and guilt. Thoughts about death. Unexpected crying. A physical sensation of emptiness in your stomach. Problems sleeping and eating. Tiredness (fatigue). Loss of interest in normal activities. Dreaming about or imagining seeing the person who died. A need to remember what or whom you lost. Difficulty thinking about anything other than your loss for a period of time. Relief. If you have been expecting the loss for a while, you may feel a sense of relief when it happens. Follow these instructions at home:    Activity Express your feelings in healthy ways, such as: Talking with others about your loss. It may be helpful to find others who have had a similar loss, such as a  support group. Writing down your feelings in a journal. Doing physical activities to release stress and emotional energy. Doing creative activities like painting, sculpting, or playing or listening to music. Practicing resilience. This is the ability to recover and adjust after facing challenges. Reading some resources that encourage resilience may help you to learn ways to practice those behaviors. General instructions Be patient with yourself and others. Allow the grieving process to happen, and remember that grieving takes time. It is likely that you may never feel completely done with some grief. You may find a way to move on while still cherishing memories and feelings about your loss. Accepting your loss is a  process. It can take months or longer to adjust. Keep all follow-up visits as told by your health care provider. This is important. Where to find support To get support for managing loss: Ask your health care provider for help and recommendations, such as grief counseling or therapy. Think about joining a support group for people who are managing a loss. Where to find more information You can find more information about managing loss from: American Society of Clinical Oncology: www.cancer.net American Psychological Association: TVStereos.ch Contact a health care provider if: Your grief is extreme and keeps getting worse. You have ongoing grief that does not improve. Your body shows symptoms of grief, such as illness. You feel depressed, anxious, or lonely. Get help right away if: You have thoughts about hurting yourself or others. If you ever feel like you may hurt yourself or others, or have thoughts about taking your own life, get help right away. You can go to your nearest emergency department or call: Your local emergency services (911 in the [place] A suicide crisis helpline, such as the [place] at 740-854-0167. This is open 24 hours a day. Summary Grief is the result of a major change or an absence of someone or something that you count on. Grief is a normal reaction to loss. The depth of grief and the period of recovery depend on the type of loss and your ability to adjust to the change and process your feelings. Processing grief requires patience and a willingness to accept your feelings and talk about your loss with people who are supportive. It is important to find resources that work for you and to realize that people experience grief differently. There is not one grieving process that works for everyone in the same way. Be aware that when grief becomes extreme, it can lead to more severe issues like isolation, depression, anxiety, or suicidal  thoughts. Talk with your health care provider if you have any of these issues. This information is not intended to replace advice given to you by your health care provider. Make sure you discuss any questions you have with your health care provider. Document Revised: 06/17/2018 Document Reviewed: 08/27/2016 Elsevier Patient Education  [place] for Managing Grief   When you are experiencing grief, many resources and support are available to help you. You are not alone.    Grief Share (https://www.https://jacobson-moore.net/):    [employer] [facility] [place], [place]  [phone] [address], [place], [place] [address] [place], [place]   [place] [address], [place]   [place]  [address] [place], [place]   [place], [place]   If a Hospice or Palliative Care team has been involved in the care of your loved one, you may reach out to your contact there or locally, you may reach out to [facility]:    Hospice and [place] [facility]   [place], [place] [address] [phone] Patient was educated on healthy self-care as she admits she has little motivation to walk lately. PCS recommendation was suggested.   Patient Self Care Activities:  Attends all scheduled provider appointments Calls provider office for new concerns or questions   Please see past updates related to this goal by clicking on the "Past Updates" button in the selected goal        Follow up:  Patient agrees to Care Plan and Follow-up.  Plan: The Managed Medicaid care management team will reach out to the patient again over the next 30-45  days.  Date/time of next scheduled Social Work care management/care coordination outreach:  07/25/21  Mickel Fuchs, Arita Miss, [place] Medicaid Team  [phone]

## 2021-06-20 NOTE — Telephone Encounter (Signed)
Copied from [place] [phone]. Topic: General - Other >> Jun 20, 2021  9:38 AM Tessa Lerner A wrote: Reason for CRM: The patient has been in contact with their insurer and verified that the shingles vaccine is covered by [facility]  Please contact further if needed

## 2021-06-23 ENCOUNTER — Other Ambulatory Visit: Payer: Self-pay

## 2021-06-23 ENCOUNTER — Other Ambulatory Visit: Payer: Self-pay | Admitting: Obstetrics and Gynecology

## 2021-06-23 NOTE — Patient Instructions (Signed)
Visit Information  Ms. Denholm was given information about Medicaid Managed Care team care coordination services as a part of their [facility] Medicaid benefit. Guenevere Suzonne Nand verbally consented to engagement with the [facility] Managed Care team.   If you are experiencing a medical emergency, please call 911 or report to your local emergency department or urgent care.   If you have a non-emergency medical problem during routine business hours, please contact your provider's office and ask to speak with a nurse.   For questions related to your [facility] health plan, please call: [phone] or go here:[email or web address]  If you would like to schedule transportation through your [facility] plan, please call the following number at least 2 days in advance of your appointment: [phone].  You can also use the MTM portal or MTM mobile app to manage your rides. For the portal, please go to [email or web address]  Call the [facility] Crisis Line at [phone], at any time, 24 hours a day, 7 days a week. If you are in danger or need immediate medical attention call 911.  If you would like help to quit smoking, call 1-800-QUIT-NOW (339-420-3506) OR Espaol: 1-855-Djelo-Ya (3-013-143-8887) o para ms informacin haga clic aqu or Text READY to 579-728 to register via text  Ms. Zahler - following are the goals we discussed in your visit today:   Goals Addressed             This Visit's Progress    RNCM: Keep Skin Clean and Dry       Follow Up Date: 07/21/21  - clean and dry skin well - keep skin dry - use a fragrance-free lotion on skin - wear a protective pad or garment    Why is this important?   Leaking urine (pee) can cause soreness from skin rashes and redness.  This is caused by skin being exposed to urine (pee).   06/23/21: overactive bladder with some leakage-no change     RNCM: Track and Manage My Symptoms-Depression       Timeframe:   Long-Range Goal Priority:  High Start Date:    12-02-2020                         Expected End Date:   ongoing                Follow Up Date: 07/21/21   - avoid negative self-talk - develop a personal safety plan - develop a plan to deal with triggers like holidays, anniversaries - exercise at least 2 to 3 times per week - have a plan for how to handle bad days - journal feelings and what helps to feel better or worse - spend time or talk with others at least 2 to 3 times per week - spend time or talk with others every day - watch for early signs of feeling worse    Why is this important?   Keeping track of your progress will help your treatment team find the right mix of medicine and therapy for you.  Write in your journal every day.  Day-to-day changes in depression symptoms are normal. It may be more helpful to check your progress at the end of each week instead of every day.    06/23/21: declines SW services    RNCM: Track and Manage My Triggers-COPD       Timeframe:  Long-Range Goal Priority:  Medium Start Date:     05-22-2020  Expected End Date:   ongoing          Follow Up Date: 07/21/21   - avoid second hand smoke - eliminate smoking in my home - identify and avoid work-related triggers - identify and remove indoor air pollutants - limit outdoor activity during cold weather - listen for public air quality announcements every day    Why is this important?   Triggers are activities or things, like tobacco smoke or cold weather, that make your COPD (chronic obstructive pulmonary disease) flare-up.  Knowing these triggers helps you plan how to stay away from them.  When you cannot remove them, you can learn how to manage them.     06/23/21:  No change in SOB, smokes 1 pack a week   The patient verbalized understanding of instructions, educational materials, and care plan provided today and agreed to receive a mailed copy of patient instructions,  educational materials, and care plan.   The Managed Medicaid care management team will reach out to the patient again over the next 30 days.  The  Patient  has been provided with contact information for the Managed Medicaid care management team and has been advised to call with any health related questions or concerns.   Kathi Dererri Cable Fearn RN, BSN [place]   [facility] Care Management Coordinator - Managed Medicaid High Risk [phone]   Following is a copy of your plan of care:  Care Plan : RNCM: COPD (Adult)  Updates made by Danie Chandlerraft, Phinneas Shakoor G, RN since 06/23/2021 12:00 AM     Problem: RNCM: Psychological Adjustment to Diagnosis (COPD)   Priority: Medium  Onset Date: 05/22/2020     Long-Range Goal: RNCM: Adjustment to Disease Achieved   Start Date: 05/22/2020  Expected End Date: 09/20/2021  Recent Progress: On track  Priority: High  Note:   Current Barriers:  Knowledge deficits related to basic understanding of COPD disease process Knowledge deficits related to basic COPD self care/management Knowledge deficit related to basic understanding of how to use inhalers and how inhaled medications work Knowledge deficit related to importance of energy conservation Limited Social Support Unable to independently manage COPD Lacks social connections Does not contact provider office for questions/concerns  06/23/21:  Some SOB, no change-continues to smoke 1 pack a week Case Manager Clinical Goal(s): patient will report using inhalers as prescribed including rinsing mouth after use patient will be able to verbalize understanding of COPD action plan and when to seek appropriate levels of medical care  patient will engage in lite exercise as tolerated to build/regain stamina and strength and reduce shortness of breath through activity tolerance  patient will verbalize basic understanding of COPD disease process and self care activities  Interventions:  Collaboration with Larae GroomsHoldsworth,  Karen, NP regarding development and update of comprehensive plan of care as evidenced by provider attestation and co-signature Inter-disciplinary care team collaboration (see longitudinal plan of care) UNABLE to independently: manage COPD Provided patient with basic written and verbal COPD education on self care/management/and exacerbation prevention Provided patient with COPD action plan and reinforced importance of daily self assessment.  Provided written and verbal instructions on pursed lip breathing and utilized returned demonstration as teach back Provided instruction about proper use of medications used for management of COPD including inhalers Advised patient to self assesses COPD action plan zone and make appointment with provider if in the yellow zone for 48 hours without improvement. Provided patient with education about the role of exercise in the management of COPD  Advised patient to engage in light exercise as tolerated 3-5 days a week Provided education about and advised patient to utilize infection prevention strategies to reduce risk of respiratory infection. Patient Goals/Self-Care Activities:  - decision-making supported - depression screen reviewed - emotional support provided - family involvement promoted - problem-solving facilitated - relaxation techniques promoted - verbalization of feelings encouraged Follow Up Plan: Telephone follow up appointment with care management team member scheduled.   Problem: RNCM: Symptom Exacerbation (COPD)   Priority: High  Onset Date: 05/22/2020     Long-Range Goal: RNCM: Smoking Cessation Symptom Exacerbation Prevented or Minimized   Start Date: 05/22/2020  Expected End Date: 09/20/2021  Recent Progress: Not on track  Priority: High  Note:   Current Barriers:  Unable to independently stop smoking  Does not adhere to provider recommendations re: smoking cessation  Lacks social connections Does not contact provider office for  questions/concerns Tobacco abuse of >40 years; currently smoking 1 pack every 2 weeks Previous quit attempts, unsuccessful several successful using patches, gum- has a pattern of stopping and then starting back  Reports smoking within 30 minutes of waking up Reports triggers to smoke include: stress, chronic conditions  Reports motivation to quit smoking includes: knows it will help her breathing and other chronic conditions  On a scale of 1-10, reports MOTIVATION to quit is 8 On a scale of 1-10, reports CONFIDENCE in quitting is 7 06/23/21: Continues to smoke 1 pack a week, trying to stop on her own-has activities to keep her occupied. Clinical Goal(s):  patient will work with RN Case Solicitor and provider towards tobacco cessation  Interventions: Collaboration with [name], NP regarding development and update of comprehensive plan of care as evidenced by provider attestation and co-signature Inter-disciplinary care team collaboration (see longitudinal plan of care) Evaluation of current treatment plan reviewed.  Provided contact information for [place] Quit Line (1-800-QUIT-NOW). Patient will outreach this group for support. Update 01/22/21:  Patient states she has utilized [employer] for resources. Discussed plans with patient for ongoing care management follow up and provided patient with direct contact information for care management team Provided patient with printed smoking cessation educational materials Provided contact information for [place] Quit Line (1-800-QUIT-NOW). Patient will outreach this group for support. Patient Goals/Self-Care Activities patient will:  - mindfulness, talking to family and friends as a habit during cravings  - verbally commit to reducing tobacco consumption - barriers to lifestyle changes reviewed and addressed - barriers to treatment reviewed and addressed - breathing techniques encouraged - modification of home and work  environment promoted - rescue (action) plan reviewed - signs/symptoms of infection reviewed - signs/symptoms of worsening disease assessed - treatment plan reviewed Follow Up Plan: Telephone follow up appointment with care management team member scheduled.    Care Plan : RNCM: Hypertension (Adult)  Updates made by [name], RN since 06/23/2021 12:00 AM     Problem: RNCM: Hypertension (Hypertension)   Priority: Medium  Onset Date: 05/22/2020     Long-Range Goal: RNCM: Hypertension Monitored   Start Date: 05/22/2020  Expected End Date: 09/20/2021  Recent Progress: On track  Priority: Medium  Note:    Current Barriers:  Knowledge Deficits related to basic understanding of hypertension pathophysiology and self care management Knowledge Deficits related to understanding of medications prescribed for management of hypertension Limited Social Support Unable to independently manage HTN Lacks social connections Does not contact provider office for questions/concern 06/23/21:  patient seen and evaluated  by PCP 06/04/21, medication changed.  Patient checks blood pressure three times a day, 160s-170s over 60s-80s Case Manager Clinical Goal(s):   patient will verbalize understanding of plan for hypertension management patient will demonstrate improved adherence to prescribed treatment plan for hypertension as evidenced by taking all medications as prescribed, monitoring and recording blood pressure as directed, adhering to low sodium/DASH diet patient will demonstrate improved health management independence as evidenced by checking blood pressure as directed and notifying PCP if SBP>160 or DBP > 90, taking all medications as prescribe, and adhering to a low sodium diet as discussed. Interventions:  Collaboration with Larae GroomsHoldsworth, Karen, NP regarding development and update of comprehensive plan of care as evidenced by provider attestation and co-signature Inter-disciplinary care team  collaboration (see longitudinal plan of care) Evaluation of current treatment plan related to hypertension self management and patient's adherence to plan as established by provider.  Provided education to patient re: stroke prevention, s/s of heart attack and stroke, DASH diet, complications of uncontrolled blood pressure Reviewed medications with patient and discussed importance of compliance.  Discussed plans with patient for ongoing care management follow up and provided patient with direct contact information for care management team Advised patient, providing education and rationale, to monitor blood pressure daily and record, calling PCP for findings outside established parameters.  Patient Goals/Self-Care Activities Over the next 120 days, patient will:  - Self administers medications as prescribed Attends all scheduled provider appointments Calls provider office for new concerns, questions, or BP outside discussed parameters Checks BP and records as discussed Follows a low sodium diet/DASH diet - blood pressure trends reviewed - depression screen reviewed - home or ambulatory blood pressure monitoring encouraged Follow Up Plan: Telephone follow up appointment with care management team member scheduled.  [facility] Will follow up with them at scheduled time.    Care Plan : RNCM: Depression (Adult)  Updates made by Danie Chandlerraft, Brance Dartt G, RN since 06/23/2021 12:00 AM     Problem: RNCM: Symptoms (Depression)   Priority: High  Onset Date: 12/02/2020     Long-Range Goal: RNCM: Symptoms Monitored and Managed   Start Date: 12/02/2020  Expected End Date: 08/22/2022  Recent Progress: Not on track  Priority: High  Note:   Current Barriers:  Ineffective Self Health Maintenance in a patient with Anxiety and Depression Unable to independently manage depression and anxiety as evidence of multiple chronic conditions and the patient feeling her health is out of control  Does not  attend all scheduled provider appointments Lacks social connections Unable to perform IADLs independently Does not contact provider office for questions/concerns 04/23/21:  Patient declines SW services at this time. 05/23/21:  Patient declines any services, intervention at this time. 06/23/21: no change-denies need for services Clinical Goal(s):  Collaboration with Larae GroomsHoldsworth, Karen, NP regarding development and update of comprehensive plan of care as evidenced by provider attestation and co-signature Inter-disciplinary care team collaboration (see longitudinal plan of care) patient will work with care management team to address care coordination and chronic disease management needs related to Disease Management Educational Needs Care Coordination Mental Health Counseling Level of Care Concerns   Interventions:  Evaluation of current treatment plan related to Anxiety and Depression, Financial constraints related to insurance not approving care for the patient to go to wound care , Limited social support, Level of care concerns, ADL IADL limitations, Mental Health Concerns , Family and relationship dysfunction, Substance abuse issues -  smoker , and Inability to perform IADL's independently self-management and  patient's adherence to plan as established by provider. Collaboration with [name], NP regarding development and update of comprehensive plan of care as evidenced by provider attestation       and co-signature Inter-disciplinary care team collaboration (see longitudinal plan of care) Discussed plans with patient for ongoing care management follow up and provided patient with direct contact information for care management team Evaluation of depression and anxiety.   Self Care Activities:  Patient verbalizes understanding of plan to effectively manage depression and anxiety Self administers medications as prescribed Attends all scheduled provider appointments Calls pharmacy for  medication refills Attends church or other social activities Performs ADL's independently Performs IADL's independently Calls provider office for new concerns or questions Work with the [facility] team to meet needs for continued CCM services  Patient Goals: - activity or exercise based on tolerance encouraged - depression screen reviewed - emotional support provided - healthy lifestyle promoted - medication side effects monitored and managed - pain managed - participation in mental health treatment encouraged - quality of sleep assessed - response to mental health treatment monitored - response to pharmacologic therapy monitored - sleep hygiene techniques encouraged - social activities and relationships encouraged - substance use assessed Follow Up Plan: The care management team will reach out to the patient again over the next 30 to 60 days.

## 2021-06-23 NOTE — Patient Outreach (Signed)
°Medicaid Managed Care   °Nurse Care Manager Note ° °06/23/2021 °Name:  Cheryl Chandler MRN:  [id number] DOB:  07/05/1965 ° °Cheryl Chandler is an 56 y.o. year old female who is a primary patient of Holdsworth, Karen, NP.  The Medicaid Managed Care Coordination team was consulted for assistance with:    °Chronic healthcare management needs, HTN, DM, LBP, COPD, tobacco use, lymphedema, anxiety/depression, HLD, GERD, LBP ° °Ms. Hatlestad was given information about Medicaid Managed Care Coordination team services today. [name] Patient agreed to services and verbal consent obtained. ° °Engaged with patient by telephone for follow up visit in response to provider referral for case management and/or care coordination services.  ° °Assessments/Interventions:  Review of past medical history, allergies, medications, health status, including review of consultants reports, laboratory and other test data, was performed as part of comprehensive evaluation and provision of chronic care management services. ° °SDOH (Social Determinants of Health) assessments and interventions performed: °[name]   ° °Flowsheet Row Most Recent Value  °[name]   °Food Insecurity Interventions --  [referrals made]  ° °  ° °Care Plan ° °Allergies  °Allergen Reactions  ° Codeine Shortness Of Breath and Rash  ° Percocet [Oxycodone-Acetaminophen] Shortness Of Breath  ° Sulfa Antibiotics Shortness Of Breath and Rash  ° Aspirin Hives  ° Bee Venom   °  Bee stings  ° Darvon [Propoxyphene]   °  Darvocet-N 100  ° Latex   ° Oxycodone Nausea And Vomiting  ° Penicillins   °  Has patient had a PCN reaction causing immediate rash, facial/tongue/throat swelling, SOB or lightheadedness with hypotension: Unknown °Has patient had a PCN reaction causing severe rash involving mucus membranes or skin necrosis: Unknown °Has patient had a PCN reaction that required hospitalization: Unknown °Has patient had a PCN reaction  occurring within the last 10 years: Unknown °If all of the above answers are "NO", then may proceed with Cephalosporin use.  ° °Medications Reviewed Today   ° ° Reviewed by ,  G, RN (Registered Nurse) on 06/23/21 at 0922  Med List Status: <None>  ° °Medication Order Taking? Sig Documenting Provider Last Dose Status Informant  °Accu-Chek Softclix Lancets lancets [id number] No CHECK BLOOD SUGAR TWICE DAILY Holdsworth, Karen, NP Taking Active   °albuterol (PROVENTIL) (2.5 MG/3ML) 0.083% nebulizer solution [id number] No INHALE CONTENTS OF 1 VIAL IN NEBULIZER EVERY 4 HOURS AS NEEDED Holdsworth, Karen, NP Taking Active   °amLODipine (NORVASC) 5 MG tablet [id number] No TAKE 1 TABLET BY MOUTH ONCE DAILY Holdsworth, Karen, NP Taking Active   °atorvastatin (LIPITOR) 80 MG tablet [id number] No Take 1 tablet (80 mg total) by mouth daily. Holdsworth, Karen, NP Taking Active   °Blood Glucose Monitoring Suppl (ACCU-CHEK GUIDE) w/Device KIT [id number] No Use to check blood sugar 3 times a day. [name], [name], NP Taking Active   °carvedilol (COREG) 3.125 MG tablet [id number] No Take 1 tablet (3.125 mg total) by mouth 2 (two) times daily with a meal. Rumball, Alison M, DO Taking Active   °citalopram (CELEXA) 40 MG tablet [id number] No Take 1 tablet (40 mg total) by mouth daily. Holdsworth, Karen, NP Taking Active   °EPINEPHrine 0.3 mg/0.3 mL IJ SOAJ injection [id number] No USE AS DIRECTED.  °Patient not taking: Reported on 06/10/2021  ° Holdsworth, Karen, NP Not Taking Active   °furosemide (LASIX) 20 MG tablet [id number] No TAKE 1 TABLET BY MOUTH ONCE DAILY IN AFTERNOON AS NEEDED LEG EDEMA Holdsworth, Karen, NP Taking Active   °  furosemide (LASIX) 40 MG tablet [id number] No Take 1 tablet (40 mg total) by mouth daily. Lane, [name], PA-C Taking Active   °         °Med Note (KENNEDY, NATHAN K   Mon Oct 14, 2020  1:41 PM) Told to take in AM, not afternoon  °gabapentin (NEURONTIN) 300 MG capsule [id number] No TAKE 1 CAPSULE BY  MOUTH 3 TIMES DAILY ASNEEDED (NEED OFFICE VISIT FOR FURTHER REFILLS) Holdsworth, Karen, NP Taking Active   °glucose blood (ACCU-CHEK GUIDE) test strip [id number] No USE TO CHECK BLOOD SUGAR 3 TIMES DAILY AS DIRECTED Holdsworth, Karen, NP Taking Active   °hydrALAZINE (APRESOLINE) 10 MG tablet [id number] No Take 1 tablet (10 mg total) by mouth 3 (three) times daily. Holdsworth, Karen, NP Taking Active   °levothyroxine (SYNTHROID) 50 MCG tablet [id number] No TAKE 1 TABLET BY MOUTH ONCE DAILY ON AN EMPTY STOMACH. WAIT 30 MINUTES BEFORE TAKING OTHER MEDS. Holdsworth, Karen, NP Taking Active   °lisinopril (ZESTRIL) 40 MG tablet [id number] No TAKE 1 TABLET BY MOUTH ONCE DAILY Holdsworth, Karen, NP Taking Active   °meloxicam (MOBIC) 15 MG tablet [id number] No TAKE 1 TABLET BY MOUTH ONCE DAILY AS NEEDED WITH FOOD OR MILK Holdsworth, Karen, NP Taking Active   °methocarbamol (ROBAXIN) 750 MG tablet [id number] No TAKE 1-2 TABLETS BY MOUTH 3 TIMES DAILY AS NEEDED  °Patient not taking: Reported on 06/10/2021  ° [name], [name], NP Not Taking Active   °mometasone-formoterol (DULERA) 100-5 MCG/ACT AERO [id number] No INHALE 2 PUFFS BY MOUTH TWICE A DAY. RINSE MOUTH AFTER USE. Lane, [name], PA-C Taking Active   °montelukast (SINGULAIR) 10 MG tablet [id number] No Take 1 tablet (10 mg total) by mouth at bedtime. Holdsworth, Karen, NP Taking Active   °nitroGLYCERIN (NITROSTAT) 0.4 MG SL tablet [id number] No DISSOLVE 1 TABLET UNDER TONGUE AT ONSET OF CHEST PAIN. REPEAT IN 5 MIN IF NOT RESOLVED, MAX 3 DOSES. 911 IF NEEDED.  °Patient not taking: Reported on 06/10/2021  ° Holdsworth, Karen, NP Not Taking Active   °omeprazole (PRILOSEC) 20 MG capsule [id number] No Take 1 capsule (20 mg total) by mouth daily. Holdsworth, Karen, NP Taking Active   °QUEtiapine (SEROQUEL) 25 MG tablet [id number] No TAKE 1 TABLET BY MOUTH AT BEDTIME Holdsworth, Karen, NP Taking Active   °SPIRIVA HANDIHALER 18 MCG inhalation capsule [id number] No INHALE CONTENTS OF  1 CAPSULE ONCE DAILY Holdsworth, Karen, NP Taking Active   °triamcinolone cream (KENALOG) 0.1 % [id number] No APPLY 1 APPLICATION TOPICALLY TWICE DAILY [name], [name], NP Taking Active   °TRULICITY 0.75 MG/0.5ML SOPN [id number] No INJECT 0.75MG SUBQ ONCE A WEEK Holdsworth, Karen, NP Taking Active   °Vitamin D, Ergocalciferol, (DRISDOL) 1.25 MG (50000 UNIT) CAPS capsule [id number] No TAKE 1 CAPSULE BY MOUTH EVERY 7 DAYS Holdsworth, Karen, NP Taking Active   ° °  °  ° °  ° ° °Patient Active Problem List  ° Diagnosis Date Noted  ° Fibrocystic disease of both breasts 05/07/2019  ° IFG (impaired fasting glucose) 05/03/2019  ° Chronic venous insufficiency 11/09/2018  ° Overactive bladder 09/30/2018  ° Peripheral neuropathy 09/30/2018  ° Apnea 01/02/2016  ° Lymphedema 11/30/2014  ° Dyspnea on exertion 11/30/2014  ° Anxiety 11/29/2014  ° Arthritis 11/29/2014  ° COPD (chronic obstructive pulmonary disease) (HCC) 11/29/2014  ° Clinical depression 11/29/2014  ° H/O eating disorder 11/29/2014  ° Esophageal reflux 11/29/2014  ° Acne inversa 11/29/2014  ° Essential hypertension 11/29/2014  ° HLD (hyperlipidemia) 11/29/2014  ° Adult hypothyroidism   11/29/2014  ° Insomnia 11/29/2014  ° Low back pain 11/29/2014  ° Morbid obesity (HCC) 11/29/2014  ° Posttraumatic stress disorder 11/29/2014  ° Vitamin D deficiency 11/29/2014  ° Nicotine dependence, cigarettes, uncomplicated 11/29/2014  ° °Conditions to be addressed/monitored per PCP order:  Chronic healthcare management needs, HTN, DM, LBP, COPD, tobacco use, lymphedema, anxiety/depression, HLD, GERD, LBP ° °Care Plan : RNCM: COPD (Adult)  °Updates made by ,  G, RN since 06/23/2021 12:00 AM  °  ° °Problem: RNCM: Psychological Adjustment to Diagnosis (COPD)   °Priority: Medium  °Onset Date: 05/22/2020  °  ° °Long-Range Goal: RNCM: Adjustment to Disease Achieved   °Start Date: 05/22/2020  °Expected End Date: 09/20/2021  °Recent Progress: On track  °Priority: High  °Note:   °Current  Barriers:  °Knowledge deficits related to basic understanding of COPD disease process °Knowledge deficits related to basic COPD self care/management °Knowledge deficit related to basic understanding of how to use inhalers and how inhaled medications work °Knowledge deficit related to importance of energy conservation °Limited Social Support °Unable to independently manage COPD °Lacks social connections °Does not contact provider office for questions/concerns  °06/23/21:  Some SOB, no change-continues to smoke 1 pack a week °Case Manager Clinical Goal(s): °patient will report using inhalers as prescribed including rinsing mouth after use °patient will be able to verbalize understanding of COPD action plan and when to seek appropriate levels of medical care ° patient will engage in lite exercise as tolerated to build/regain stamina and strength and reduce shortness of breath through activity tolerance ° patient will verbalize basic understanding of COPD disease process and self care activities  °Interventions:  °Collaboration with Holdsworth, Karen, NP regarding development and update of comprehensive plan of care as evidenced by provider attestation and co-signature °Inter-disciplinary care team collaboration (see longitudinal plan of care) °UNABLE to independently: manage COPD °Provided patient with basic written and verbal COPD education on self care/management/and exacerbation prevention °Provided patient with COPD action plan and reinforced importance of daily self assessment.  °Provided written and verbal instructions on pursed lip breathing and utilized returned demonstration as teach back °Provided instruction about proper use of medications used for management of COPD including inhalers °Advised patient to self assesses COPD action plan zone and make appointment with provider if in the yellow zone for 48 hours without improvement. °Provided patient with education about the role of exercise in the management of  COPD °Advised patient to engage in light exercise as tolerated 3-5 days a week °Provided education about and advised patient to utilize infection prevention strategies to reduce risk of respiratory infection. °Patient Goals/Self-Care Activities:  °- decision-making supported °- depression screen reviewed °- emotional support provided °- family involvement promoted °- problem-solving facilitated °- relaxation techniques promoted °- verbalization of feelings encouraged °Follow Up Plan: Telephone follow up appointment with care management team member scheduled.  ° °Problem: RNCM: Symptom Exacerbation (COPD)   °Priority: High  °Onset Date: 05/22/2020  °  ° °Long-Range Goal: RNCM: Smoking Cessation Symptom Exacerbation Prevented or Minimized   °Start Date: 05/22/2020  °Expected End Date: 09/20/2021  °Recent Progress: Not on track  °Priority: High  °Note:   °Current Barriers:  °Unable to independently stop smoking  °Does not adhere to provider recommendations re: smoking cessation  °Lacks social connections °Does not contact provider office for questions/concerns °Tobacco abuse of >40 years; currently smoking 1 pack every 2 weeks °Previous quit attempts, unsuccessful several successful using patches, gum- has a pattern of stopping and then starting   back  °Reports smoking within 30 minutes of waking up °Reports triggers to smoke include: stress, chronic conditions  °Reports motivation to quit smoking includes: knows it will help her breathing and other chronic conditions  °On a scale of 1-10, reports MOTIVATION to quit is 8 °On a scale of 1-10, reports CONFIDENCE in quitting is 7 °06/23/21: Continues to smoke 1 pack a week, trying to stop on her own-has activities to keep her occupied. °Clinical Goal(s):  °patient will work with RN Case Manager Licensed Clinical Social Worker and provider towards tobacco cessation  °Interventions: °Collaboration with Holdsworth, Karen, NP regarding development and update of comprehensive plan  of care as evidenced by provider attestation and co-signature °Inter-disciplinary care team collaboration (see longitudinal plan of care) °Evaluation of current treatment plan reviewed.  °Provided contact information for [place] [facility] (1-800-QUIT-NOW). Patient will outreach this group for support. °Update 01/22/21:  Patient states she has utilized [place] [facility] for resources. °Discussed plans with patient for ongoing care management follow up and provided patient with direct contact information for care management team °Provided patient with printed smoking cessation educational materials °Provided contact information for [place] [facility] (1-800-QUIT-NOW). Patient will outreach this group for support. °Patient Goals/Self-Care Activities °patient will:  °- mindfulness, talking to family and friends as a habit during cravings  °- verbally commit to reducing tobacco consumption °- barriers to lifestyle changes reviewed and addressed °- barriers to treatment reviewed and addressed °- breathing techniques encouraged °- modification of home and work environment promoted °- rescue (action) plan reviewed °- signs/symptoms of infection reviewed °- signs/symptoms of worsening disease assessed °- treatment plan reviewed °Follow Up Plan: Telephone follow up appointment with care management team member scheduled.   ° °Care Plan : RNCM: Hypertension (Adult)  °Updates made by ,  G, RN since 06/23/2021 12:00 AM  °  ° °Problem: RNCM: Hypertension (Hypertension)   °Priority: Medium  °Onset Date: 05/22/2020  °  ° °Long-Range Goal: RNCM: Hypertension Monitored   °Start Date: 05/22/2020  °Expected End Date: 09/20/2021  °Recent Progress: On track  °Priority: Medium  °Note:   ° °Current Barriers:  °Knowledge Deficits related to basic understanding of hypertension pathophysiology and self care management °Knowledge Deficits related to understanding of medications prescribed for management of hypertension °Limited Social Support °Unable to  independently manage HTN °Lacks social connections °Does not contact provider office for questions/concern °06/23/21:  patient seen and evaluated by PCP 06/04/21, medication changed.  Patient checks blood pressure three times a day, 160s-170s over 60s-80s °Case Manager Clinical Goal(s):  ° patient will verbalize understanding of plan for hypertension management °patient will demonstrate improved adherence to prescribed treatment plan for hypertension as evidenced by taking all medications as prescribed, monitoring and recording blood pressure as directed, adhering to low sodium/DASH diet °patient will demonstrate improved health management independence as evidenced by checking blood pressure as directed and notifying PCP if SBP>160 or DBP > 90, taking all medications as prescribe, and adhering to a low sodium diet as discussed. °Interventions:  °Collaboration with Holdsworth, Karen, NP regarding development and update of comprehensive plan of care as evidenced by provider attestation and co-signature °Inter-disciplinary care team collaboration (see longitudinal plan of care) °Evaluation of current treatment plan related to hypertension self management and patient's adherence to plan as established by provider.  °Provided education to patient re: stroke prevention, s/s of heart attack and stroke, DASH diet, complications of uncontrolled blood pressure °Reviewed medications with patient and discussed importance of compliance.  °Discussed   plans with patient for ongoing care management follow up and provided patient with direct contact information for care management team °Advised patient, providing education and rationale, to monitor blood pressure daily and record, calling PCP for findings outside established parameters.  °Patient Goals/Self-Care Activities °Over the next 120 days, patient will:  °- Self administers medications as prescribed °Attends all scheduled provider appointments °Calls provider office for new  concerns, questions, or BP outside discussed parameters °Checks BP and records as discussed °Follows a low sodium diet/DASH diet °- blood pressure trends reviewed °- depression screen reviewed °- home or ambulatory blood pressure monitoring encouraged °Follow Up Plan: Telephone follow up appointment with care management team member scheduled.  Transitioning to HRMM team. Will follow up with them at scheduled time.   ° °Care Plan : RNCM: Depression (Adult)  °Updates made by ,  G, RN since 06/23/2021 12:00 AM  °  ° °Problem: RNCM: Symptoms (Depression)   °Priority: High  °Onset Date: 12/02/2020  °  ° °Long-Range Goal: RNCM: Symptoms Monitored and Managed   °Start Date: 12/02/2020  °Expected End Date: 08/22/2022  °Recent Progress: Not on track  °Priority: High  °Note:   °Current Barriers:  °Ineffective Self Health Maintenance in a patient with Anxiety and Depression °Unable to independently manage depression and anxiety as evidence of multiple chronic conditions and the patient feeling her health is out of control  °Does not attend all scheduled provider appointments °Lacks social connections °Unable to perform IADLs independently °Does not contact provider office for questions/concerns °04/23/21:  Patient declines SW services at this time. °05/23/21:  Patient declines any services, intervention at this time. °06/23/21: no change-denies need for services °Clinical Goal(s):  °Collaboration with Holdsworth, Karen, NP regarding development and update of comprehensive plan of care as evidenced by provider attestation and co-signature °Inter-disciplinary care team collaboration (see longitudinal plan of care) °patient will work with care management team to address care coordination and chronic disease management needs related to Disease Management °Educational Needs °Care Coordination °Mental Health Counseling °Level of Care Concerns   °Interventions:  °Evaluation of current treatment plan related to Anxiety and  Depression, Financial constraints related to insurance not approving care for the patient to go to wound care , Limited social support, Level of care concerns, ADL IADL limitations, Mental Health Concerns , Family and relationship dysfunction, Substance abuse issues -  smoker , and Inability to perform IADL's independently self-management and patient's adherence to plan as established by provider. °Collaboration with Holdsworth, Karen, NP regarding development and update of comprehensive plan of care as evidenced by provider attestation °      and co-signature °Inter-disciplinary care team collaboration (see longitudinal plan of care) °Discussed plans with patient for ongoing care management follow up and provided patient with direct contact information for care management team °Evaluation of depression and anxiety.   °Self Care Activities:  °Patient verbalizes understanding of plan to effectively manage depression and anxiety °Self administers medications as prescribed °Attends all scheduled provider appointments °Calls pharmacy for medication refills °Attends church or other social activities °Performs ADL's independently °Performs IADL's independently °Calls provider office for new concerns or questions °Work with the HRMM team to meet needs for continued CCM services  °Patient Goals: °- activity or exercise based on tolerance encouraged °- depression screen reviewed °- emotional support provided °- healthy lifestyle promoted °- medication side effects monitored and managed °- pain managed °- participation in mental health treatment encouraged °- quality of sleep assessed °- response to mental health   treatment monitored - response to pharmacologic therapy monitored - sleep hygiene techniques encouraged - social activities and relationships encouraged - substance use assessed Follow Up Plan: The care management team will reach out to the patient again over the next 30 to 60 days.    Follow Up:  Patient  agrees to Care Plan and Follow-up.  Plan: The Managed Medicaid care management team will reach out to the patient again over the next 30 days. and The  Patient has been provided with contact information for the Managed Medicaid care management team and has been advised to call with any health related questions or concerns.  Date/time of next scheduled RN care management/care coordination outreach:  07/21/21 at 0900.

## 2021-06-25 DIAGNOSIS — Z419 Encounter for procedure for purposes other than remedying health state, unspecified: Secondary | ICD-10-CM | POA: Diagnosis not present

## 2021-07-01 ENCOUNTER — Other Ambulatory Visit: Payer: Self-pay | Admitting: Nurse Practitioner

## 2021-07-01 DIAGNOSIS — J301 Allergic rhinitis due to pollen: Secondary | ICD-10-CM

## 2021-07-02 ENCOUNTER — Other Ambulatory Visit: Payer: Self-pay

## 2021-07-02 ENCOUNTER — Ambulatory Visit (INDEPENDENT_AMBULATORY_CARE_PROVIDER_SITE_OTHER): Payer: Medicaid Other | Admitting: Nurse Practitioner

## 2021-07-02 ENCOUNTER — Encounter: Payer: Self-pay | Admitting: Nurse Practitioner

## 2021-07-02 VITALS — BP 148/65 | HR 76 | Temp 98.6°F

## 2021-07-02 DIAGNOSIS — I10 Essential (primary) hypertension: Secondary | ICD-10-CM | POA: Diagnosis not present

## 2021-07-02 MED ORDER — HYDRALAZINE HCL 25 MG PO TABS: 25.0000 mg | ORAL_TABLET | Freq: Three times a day (TID) | ORAL | 1 refills | Status: DC

## 2021-07-02 NOTE — Progress Notes (Signed)
? ?BP (!) 148/65 (BP Location: Left Arm, Cuff Size: Large)   Pulse 76   Temp 98.6 ?F (37 ?C) (Oral)   LMP 06/09/2017 (Approximate)   SpO2 96%   ? ?Subjective:  ? ? Patient ID: Cheryl Chandler, female    DOB: 11-23-1965, 56 y.o.   MRN: [id number] ? ?HPI: ?Cheryl Chandler is a 56 y.o. female ? ?Chief Complaint  ?Patient presents with  ? Hypertension  ?  1 month f/up  ? ?HYPERTENSION ?Hypertension status: controlled  ?Satisfied with current treatment? yes ?Duration of hypertension: years ?BP monitoring frequency:  not checking ?BP range:  ?BP medication side effects:  no ?Medication compliance: excellent compliance ?Previous BP meds: Carvedilol, hydralazine, lasix and lisinopril, amlodipine ?Aspirin: no ?Recurrent headaches: no ?Visual changes: no ?Palpitations: no ?Dyspnea: no ?Chest pain: no ?Lower extremity edema: no ?Dizzy/lightheaded: no ? ?Relevant past medical, surgical, family and social history reviewed and updated as indicated. Interim medical history since our last visit reviewed. ?Allergies and medications reviewed and updated. ? ?Review of Systems  ?Eyes:  Negative for visual disturbance.  ?Respiratory:  Negative for cough, chest tightness and shortness of breath.   ?Cardiovascular:  Negative for chest pain, palpitations and leg swelling.  ?Neurological:  Negative for dizziness and headaches.  ? ?Per HPI unless specifically indicated above ? ?   ?Objective:  ?  ?BP (!) 148/65 (BP Location: Left Arm, Cuff Size: Large)   Pulse 76   Temp 98.6 ?F (37 ?C) (Oral)   LMP 06/09/2017 (Approximate)   SpO2 96%   ?Wt Readings from Last 3 Encounters:  ?06/04/21 (!) 331 lb (150.1 kg)  ?02/05/21 290 lb (131.5 kg)  ?11/20/20 290 lb (131.5 kg)  ?  ?Physical Exam ?Vitals and nursing note reviewed.  ?Constitutional:   ?   General: She is not in acute distress. ?   Appearance: Normal appearance. She is obese. She is not ill-appearing, toxic-appearing or diaphoretic.  ?HENT:  ?   Head: Normocephalic.  ?    Right Ear: External ear normal.  ?   Left Ear: External ear normal.  ?   Nose: Nose normal.  ?   Mouth/Throat:  ?   Mouth: Mucous membranes are moist.  ?   Pharynx: Oropharynx is clear.  ?Eyes:  ?   General:     ?   Right eye: No discharge.     ?   Left eye: No discharge.  ?   Extraocular Movements: Extraocular movements intact.  ?   Conjunctiva/sclera: Conjunctivae normal.  ?   Pupils: Pupils are equal, round, and reactive to light.  ?Cardiovascular:  ?   Rate and Rhythm: Normal rate and regular rhythm.  ?   Heart sounds: No murmur heard. ?Pulmonary:  ?   Effort: Pulmonary effort is normal. No respiratory distress.  ?   Breath sounds: Normal breath sounds. No wheezing or rales.  ?Musculoskeletal:  ?   Cervical back: Normal range of motion and neck supple.  ?   Right lower leg: Edema present.  ?   Left lower leg: Edema present.  ?Skin: ?   General: Skin is warm and dry.  ?   Capillary Refill: Capillary refill takes less than 2 seconds.  ?Neurological:  ?   General: No focal deficit present.  ?   Mental Status: She is alert and oriented to person, place, and time. Mental status is at baseline.  ?Psychiatric:     ?   Mood and Affect: Mood normal.     ?  Behavior: Behavior normal.     ?   Thought Content: Thought content normal.     ?   Judgment: Judgment normal.  ? ? ?Results for orders placed or performed in visit on 06/05/21  ?Cologuard  ?Result Value Ref Range  ? COLOGUARD Negative Negative  ? ?   ?Assessment & Plan:  ? ?Problem List Items Addressed This Visit   ? ?  ? Cardiovascular and Mediastinum  ? Essential hypertension - Primary  ?  Chronic.  Still elevated.  Will increase to Hydralazine 25mg  TID. Recommend checking blood pressure home and bringing log to next visit.  Follow up in 2 months for reevaluation. ?  ?  ? Relevant Medications  ? hydrALAZINE (APRESOLINE) 25 MG tablet  ?  ? ?Follow up plan: ?Return in about 2 months (around 09/01/2021) for HTN, HLD, DM2 FU. ? ? ? ? ? ?

## 2021-07-02 NOTE — Telephone Encounter (Signed)
Hydralazine refilled 06/04/2021 #90 1 refill - 6 month supply.  Requested Prescriptions  Pending Prescriptions Disp Refills   hydrALAZINE (APRESOLINE) 10 MG tablet [Pharmacy Med Name: HYDRALAZINE HCL 10 MG TAB] 90 tablet 1    Sig: TAKE 1 TABLET BY MOUTH 3 TIMES DAILY     Cardiovascular:  Vasodilators Failed - 07/01/2021 10:20 AM      Failed - ANA Screen, Ifa, Serum in normal range and within 360 days    No results found for: ANA, ANATITER, LABANTI       Failed - Last BP in normal range    BP Readings from Last 1 Encounters:  06/04/21 (!) 152/60         Passed - HCT in normal range and within 360 days    Hematocrit  Date Value Ref Range Status  06/04/2021 38.5 34.0 - 46.6 % Final         Passed - HGB in normal range and within 360 days    Hemoglobin  Date Value Ref Range Status  06/04/2021 13.1 11.1 - 15.9 g/dL Final         Passed - RBC in normal range and within 360 days    RBC  Date Value Ref Range Status  06/04/2021 4.36 3.77 - 5.28 x10E6/uL Final  02/05/2021 4.16 3.87 - 5.11 MIL/uL Final         Passed - WBC in normal range and within 360 days    WBC  Date Value Ref Range Status  06/04/2021 8.5 3.4 - 10.8 x10E3/uL Final  02/05/2021 8.1 4.0 - 10.5 K/uL Final         Passed - PLT in normal range and within 360 days    Platelets  Date Value Ref Range Status  06/04/2021 355 150 - 450 x10E3/uL Final         Passed - Valid encounter within last 12 months    Recent Outpatient Visits          4 weeks ago Annual physical exam   [facility] [name], NP   6 months ago Essential hypertension   [facility] [place], [place] P, DO   7 months ago 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, limited to breakdown of skin ([place])   [facility] [name], NP   7 months ago Chronic venous insufficiency   [place], Santiago Glad, NP   8 months ago Chronic  obstructive pulmonary disease, unspecified COPD type ([place])   [place], Karen, NP      Future Appointments            Today [name], NP [place], [facility]            meloxicam (MOBIC) 15 MG tablet [Pharmacy Med Name: MELOXICAM 15 MG TAB] 90 tablet 0    Sig: TAKE 1 TABLET BY MOUTH ONCE DAILY AS NEEDED WITH FOOD OR MILK     Analgesics:  COX2 Inhibitors Failed - 07/01/2021 10:20 AM      Failed - Manual Review: Labs are only required if the patient has taken medication for more than 8 weeks.      Passed - HGB in normal range and within 360 days    Hemoglobin  Date Value Ref Range Status  06/04/2021 13.1 11.1 - 15.9 g/dL Final         Passed - Cr in normal range and within 360 days  Creatinine  Date Value Ref Range Status  08/05/2014 0.86 mg/dL Final    Comment:    0.44-1.00 NOTE: New Reference Range  07/03/14    Creatinine, Ser  Date Value Ref Range Status  06/04/2021 0.78 0.57 - 1.00 mg/dL Final         Passed - HCT in normal range and within 360 days    Hematocrit  Date Value Ref Range Status  06/04/2021 38.5 34.0 - 46.6 % Final         Passed - AST in normal range and within 360 days    AST  Date Value Ref Range Status  06/04/2021 16 0 - 40 IU/L Final   SGOT(AST)  Date Value Ref Range Status  08/05/2014 30 U/L Final    Comment:    15-41 NOTE: New Reference Range  07/03/14          Passed - ALT in normal range and within 360 days    ALT  Date Value Ref Range Status  06/04/2021 18 0 - 32 IU/L Final   SGPT (ALT)  Date Value Ref Range Status  08/05/2014 31 U/L Final    Comment:    14-54 NOTE: New Reference Range  07/03/14          Passed - eGFR is 30 or above and within 360 days    EGFR (African American)  Date Value Ref Range Status  08/05/2014 >60  Final   GFR calc Af Amer  Date Value Ref Range Status  04/03/2020 92 >59 mL/min/1.73 Final    Comment:    **In accordance with recommendations  from the NKF-ASN Task force,**   [facility] is in the process of updating its eGFR calculation to the   2021 CKD-EPI creatinine equation that estimates kidney function   without a race variable.    EGFR (Non-African Amer.)  Date Value Ref Range Status  08/05/2014 >60  Final    Comment:    eGFR values <29m/min/1.73 m2 may be an indication of chronic kidney disease (CKD). Calculated eGFR is useful in patients with stable renal function. The eGFR calculation will not be reliable in acutely ill patients when serum creatinine is changing rapidly. It is not useful in patients on dialysis. The eGFR calculation may not be applicable to patients at the low and high extremes of body sizes, pregnant women, and vegetarians.    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, limited to breakdown of skin ([facility]   [facility]   7 months ago Chronic venous insufficiency   [place]   8 months ago Chronic obstructive pulmonary disease, unspecified COPD type ([place]   [facility]  Future Appointments            Today [name], NP [facility], [facility]            amLODipine ([place]) 5 MG tablet [Pharmacy Med Name: AMLODIPINE BESYLATE 5 MG TAB] 90 tablet 0    Sig: TAKE 1 TABLET BY  MOUTH ONCE DAILY     Cardiovascular: Calcium Channel Blockers 2 Failed - 07/01/2021 10:20 AM      Failed - Last BP in normal range    BP Readings from Last 1 Encounters:  06/04/21 (!) 152/60         Passed - Last Heart Rate in normal range    Pulse Readings from Last 1 Encounters:  06/04/21 74         Passed - Valid encounter within last 6 months    Recent Outpatient Visits          4 weeks ago Annual physical exam   [facility] [name], NP   6 months ago Essential hypertension   [facility] [place], [name], DO   7 months ago 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, limited to breakdown of skin ([place])   [facility] [name], NP   7 months ago Chronic venous insufficiency   [facility] [place], Santiago Glad, NP   8 months ago Chronic obstructive pulmonary disease, unspecified COPD type ([place])   [place], Karen, NP      Future Appointments            Today [name], NP [place], [facility]            furosemide (LASIX) 20 MG tablet [Pharmacy Med Name: FUROSEMIDE 20 MG TAB] 30 tablet 1    Sig: TAKE 1 TABLET BY MOUTH ONCE DAILY IN AFTERNOON AS NEEDED LEG EDEMA     Cardiovascular:  Diuretics - Loop Failed - 07/01/2021 10:20 AM      Failed - Mg Level in normal range and within 180 days    No results found for: MG       Failed - Last BP in normal range    BP Readings from Last 1 Encounters:  06/04/21 (!) 152/60         Passed - K in normal range and within 180 days    Potassium  Date Value Ref Range Status  06/04/2021 4.4 3.5 - 5.2 mmol/L Final  08/05/2014 4.5 mmol/L Final    Comment:    3.5-5.1 NOTE: New Reference Range  07/03/14          Passed - Ca in normal range and within 180 days    Calcium  Date Value Ref Range Status  06/04/2021 9.1 8.7 - 10.2 mg/dL Final   Calcium, Total  Date Value Ref  Range Status  08/05/2014 8.2 (L) mg/dL Final    Comment:    8.9-10.3 NOTE: New Reference Range  07/03/14          Passed - Na in normal range and within 180 days    Sodium  Date Value Ref Range Status  06/04/2021 140 134 - 144 mmol/L Final  08/05/2014 135 mmol/L Final    Comment:    135-145 NOTE: New Reference Range  07/03/14          Passed - Cr in normal range and within 180 days    Creatinine  Date Value Ref Range Status  08/05/2014  0.86 mg/dL Final    Comment:    0.44-1.00 NOTE: New Reference Range  07/03/14    Creatinine, Ser  Date Value Ref Range Status  06/04/2021 0.78 0.57 - 1.00 mg/dL Final         Passed - Cl in normal range and within 180 days    Chloride  Date Value Ref Range Status  06/04/2021 102 96 - 106 mmol/L Final  08/05/2014 102 mmol/L Final    Comment:    101-111 NOTE: New Reference Range  07/03/14          Passed - Valid encounter within last 6 months    Recent Outpatient Visits          4 weeks ago Annual physical exam   [facility] [name], NP   6 months ago Essential hypertension   [facility] [name], [name], DO   7 months ago 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, limited to breakdown of skin ([place])   [facility] [name], NP   7 months ago Chronic venous insufficiency   [facility] [place], Santiago Glad, NP   8 months ago Chronic obstructive pulmonary disease, unspecified COPD type ([place])   [place], Karen, NP      Future Appointments            Today [name], NP [facility], [facility]            montelukast (SINGULAIR) 10 MG tablet [Pharmacy Med Name: MONTELUKAST SODIUM 10 MG TAB] 90 tablet 1    Sig: TAKE 1 TABLET BY MOUTH AT BEDTIME     Pulmonology:  Leukotriene Inhibitors Passed - 07/01/2021 10:20 AM      Passed - Valid encounter within last 12 months     Recent Outpatient Visits          4 weeks ago Annual physical exam   [place], NP   6 months ago Essential hypertension   [place], [name], DO   7 months ago 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

## 2021-07-02 NOTE — Telephone Encounter (Signed)
Requested medications are due for refill today.  yes  Requested medications are on the active medications list.  yes  Last refill. 05/06/2021 #30 1 refill  Future visit scheduled.   yes  Notes to clinic.  Failed refill protocol d/t missing labs.    Requested Prescriptions  Pending Prescriptions Disp Refills   furosemide (LASIX) 20 MG tablet [Pharmacy Med Name: FUROSEMIDE 20 MG TAB] 30 tablet 1    Sig: TAKE 1 TABLET BY MOUTH ONCE DAILY IN AFTERNOON AS NEEDED LEG EDEMA     Cardiovascular:  Diuretics - Loop Failed - 07/01/2021 10:20 AM      Failed - Mg Level in normal range and within 180 days    No results found for: MG        Failed - Last BP in normal range    BP Readings from Last 1 Encounters:  06/04/21 (!) 152/60          Passed - K in normal range and within 180 days    Potassium  Date Value Ref Range Status  06/04/2021 4.4 3.5 - 5.2 mmol/L Final  08/05/2014 4.5 mmol/L Final    Comment:    3.5-5.1 NOTE: New Reference Range  07/03/14           Passed - Ca in normal range and within 180 days    Calcium  Date Value Ref Range Status  06/04/2021 9.1 8.7 - 10.2 mg/dL Final   Calcium, Total  Date Value Ref Range Status  08/05/2014 8.2 (L) mg/dL Final    Comment:    8.9-10.3 NOTE: New Reference Range  07/03/14           Passed - Na in normal range and within 180 days    Sodium  Date Value Ref Range Status  06/04/2021 140 134 - 144 mmol/L Final  08/05/2014 135 mmol/L Final    Comment:    135-145 NOTE: New Reference Range  07/03/14           Passed - Cr in normal range and within 180 days    Creatinine  Date Value Ref Range Status  08/05/2014 0.86 mg/dL Final    Comment:    0.44-1.00 NOTE: New Reference Range  07/03/14    Creatinine, Ser  Date Value Ref Range Status  06/04/2021 0.78 0.57 - 1.00 mg/dL Final          Passed - Cl in normal range and within 180 days    Chloride  Date Value Ref Range Status  06/04/2021 102 96 - 106 mmol/L  Final  08/05/2014 102 mmol/L Final    Comment:    101-111 NOTE: New Reference Range  07/03/14           Passed - Valid encounter within last 6 months    Recent Outpatient Visits           4 weeks ago Annual physical exam   [facility] [name], NP   6 months ago Essential hypertension   [facility] [place], [name], DO   7 months ago 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, limited to breakdown of skin [facility])   [facility] [name], NP   7 months ago Chronic venous insufficiency   [place], Santiago Glad, NP   8 months ago Chronic obstructive pulmonary disease, unspecified COPD type ([place])   [place], Karen, NP       Future  Appointments             Today [name], NP [facility], [facility]            Signed Prescriptions Disp Refills   meloxicam (MOBIC) 15 MG tablet 90 tablet 0    Sig: TAKE 1 TABLET BY MOUTH ONCE DAILY AS NEEDED WITH FOOD OR MILK     Analgesics:  COX2 Inhibitors Failed - 07/01/2021 10:20 AM      Failed - Manual Review: Labs are only required if the patient has taken medication for more than 8 weeks.      Passed - HGB in normal range and within 360 days    Hemoglobin  Date Value Ref Range Status  06/04/2021 13.1 11.1 - 15.9 g/dL Final          Passed - Cr in normal range and within 360 days    Creatinine  Date Value Ref Range Status  08/05/2014 0.86 mg/dL Final    Comment:    0.44-1.00 NOTE: New Reference Range  07/03/14    Creatinine, Ser  Date Value Ref Range Status  06/04/2021 0.78 0.57 - 1.00 mg/dL Final          Passed - HCT in normal range and within 360 days    Hematocrit  Date Value Ref Range Status  06/04/2021 38.5 34.0 - 46.6 % Final          Passed - AST in normal range and within 360 days    AST  Date Value Ref Range Status  06/04/2021 16 0 - 40  IU/L Final   SGOT(AST)  Date Value Ref Range Status  08/05/2014 30 U/L Final    Comment:    15-41 NOTE: New Reference Range  07/03/14           Passed - ALT in normal range and within 360 days    ALT  Date Value Ref Range Status  06/04/2021 18 0 - 32 IU/L Final   SGPT (ALT)  Date Value Ref Range Status  08/05/2014 31 U/L Final    Comment:    14-54 NOTE: New Reference Range  07/03/14           Passed - eGFR is 30 or above and within 360 days    EGFR (African American)  Date Value Ref Range Status  08/05/2014 >60  Final   GFR calc Af Amer  Date Value Ref Range Status  04/03/2020 92 >59 mL/min/1.73 Final    Comment:    **In accordance with recommendations from the NKF-ASN Task force,**   [facility] is in the process of updating its eGFR calculation to the   2021 CKD-EPI creatinine equation that estimates kidney function   without a race variable.    EGFR (Non-African Amer.)  Date Value Ref Range Status  08/05/2014 >60  Final    Comment:    eGFR values <55m/min/1.73 m2 may be an indication of chronic kidney disease (CKD). Calculated eGFR is useful in patients with stable renal function. The eGFR calculation will not be reliable in acutely ill patients when serum creatinine is changing rapidly. It is not useful in patients on dialysis. The eGFR calculation may not be applicable to patients at the low and high extremes of body sizes, pregnant women, and vegetarians.    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, limited to breakdown of skin ([place])   [facility] [name], NP   7 months ago Chronic venous insufficiency   [facility] [place], Santiago Glad, NP   8 months ago Chronic obstructive pulmonary disease, unspecified COPD type ([place])   [place], Karen, NP       Future Appointments             Today [name], NP [facility], [facility]             amLODipine (NORVASC) 5 MG tablet 90 tablet 0    Sig: TAKE 1 TABLET BY MOUTH ONCE DAILY     Cardiovascular: Calcium Channel Blockers 2 Failed - 07/01/2021 10:20 AM      Failed - Last BP in normal range    BP Readings from Last 1 Encounters:  06/04/21 (!) 152/60          Passed - Last Heart Rate in normal range    Pulse Readings from Last 1 Encounters:  06/04/21 74          Passed - Valid encounter within last 6 months    Recent Outpatient Visits           4 weeks ago Annual physical exam   [facility] [name], NP   6 months ago Essential hypertension   [facility] [place], [name], DO   7 months ago 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, limited to breakdown of skin ([place])   [facility] [name], NP   7 months ago Chronic venous insufficiency   [place], Santiago Glad, NP   8 months ago Chronic obstructive pulmonary disease, unspecified COPD type ([place])   [place], Karen, NP       Future Appointments             Today [name], NP [facility], [facility]             montelukast (SINGULAIR) 10 MG tablet 90 tablet 1    Sig: TAKE 1 TABLET BY MOUTH AT BEDTIME     Pulmonology:  Leukotriene Inhibitors Passed - 07/01/2021 10:20 AM       Passed - Valid encounter within last 12 months    Recent Outpatient Visits           4 weeks ago Annual physical exam   [place], NP   6 months ago Essential hypertension   [place], [name], DO   7 months ago 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, limited to breakdown of skin [facility])   [facility] [name], NP   7 months ago Chronic venous insufficiency   [facility] [name], NP   8 months  ago Chronic obstructive pulmonary disease, unspecified COPD type ([place])   [place], NP       Future Appointments             Today [name], NP [facility], [facility]            Refused Prescriptions Disp Refills   hydrALAZINE (APRESOLINE) 10 MG tablet [Pharmacy Med Name: HYDRALAZINE HCL 10 MG TAB] 90 tablet 1    Sig: TAKE 1 TABLET BY MOUTH 3 TIMES DAILY     Cardiovascular:  Vasodilators Failed - 07/01/2021 10:20 AM      Failed - ANA Screen, Ifa, Serum in normal range and within 360 days    No results found for: ANA, ANATITER, LABANTI        Failed - Last BP in normal range    BP Readings from Last 1 Encounters:  06/04/21 (!) 152/60          Passed - HCT in normal range and within 360 days    Hematocrit  Date Value Ref Range Status  06/04/2021 38.5 34.0 - 46.6 % Final          Passed - HGB in normal range and within 360 days    Hemoglobin  Date Value Ref Range Status  06/04/2021 13.1 11.1 - 15.9 g/dL Final          Passed - RBC in normal range and within 360 days    RBC  Date Value Ref Range Status  06/04/2021 4.36 3.77 - 5.28 x10E6/uL Final  02/05/2021 4.16 3.87 - 5.11 MIL/uL Final          Passed - WBC in normal range and within 360 days    WBC  Date Value Ref Range Status  06/04/2021 8.5 3.4 - 10.8 x10E3/uL Final  02/05/2021 8.1 4.0 - 10.5 K/uL Final           Passed - PLT in normal range and within 360 days    Platelets  Date Value Ref Range Status  06/04/2021 355 150 - 450 x10E3/uL Final          Passed - Valid encounter within last 12 months    Recent Outpatient Visits           4 weeks ago Annual physical exam   [facility] [name], NP   6 months ago Essential hypertension   [facility] [place], [name], DO   7 months ago 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)   [facility] [name], NP   7 months ago Chronic venous insufficiency   [place], Santiago Glad, NP   8 months ago Chronic obstructive pulmonary disease, unspecified COPD type ([place])   [place], Karen, NP       Future Appointments             Today [name], NP [place], [place]

## 2021-07-02 NOTE — Assessment & Plan Note (Signed)
Chronic.  Still elevated.  Will increase to Hydralazine 25mg  TID. Recommend checking blood pressure home and bringing log to next visit.  Follow up in 2 months for reevaluation. ?

## 2021-07-12 DIAGNOSIS — R3981 Functional urinary incontinence: Secondary | ICD-10-CM | POA: Diagnosis not present

## 2021-07-12 DIAGNOSIS — Z993 Dependence on wheelchair: Secondary | ICD-10-CM | POA: Diagnosis not present

## 2021-07-21 ENCOUNTER — Other Ambulatory Visit: Payer: Self-pay

## 2021-07-21 ENCOUNTER — Other Ambulatory Visit: Payer: Self-pay | Admitting: Obstetrics and Gynecology

## 2021-07-21 NOTE — Patient Instructions (Signed)
Visit Information ? ?[name] was given information about Medicaid Managed Care team care coordination services as a part of their [facility] Medicaid benefit. Cheryl Chandler verbally consented to engagement with the [facility] Managed Care team.  ? ?If you are experiencing a medical emergency, please call 911 or report to your local emergency department or urgent care.  ? ?If you have a non-emergency medical problem during routine business hours, please contact your provider's office and ask to speak with a nurse.  ? ?For questions related to your [facility] health plan, please call: [phone] or go here:[email or web address] ? ?If you would like to schedule transportation through your [facility] plan, please call the following number at least 2 days in advance of your appointment: [phone]. ? You can also use the MTM portal or MTM mobile app to manage your rides. For the portal, please go to [email or web address] ? ?Call the Behavioral Health Crisis Line at [phone], at any time, 24 hours a day, 7 days a week. If you are in danger or need immediate medical attention call 911. ? ?If you would like help to quit smoking, call 1-800-QUIT-NOW (816-427-2066) OR Espa?ol: 1-855-D?jelo-Ya 262-129-5016) o para m?s informaci?n haga clic aqu? or Text READY to 200-400 to register via text ? ?[name] - following are the goals we discussed in your visit today:  ? Goals Addressed   ? ?  ?  ?  ?  ? This Visit's Progress  ?  RNCM: Keep Skin Clean and Dry     ?  Follow Up Date: 08/21/21 ? ?- clean and dry skin well ?- keep skin dry ?- use a fragrance-free lotion on skin ?- wear a protective pad or garment  ?  ?Why is this important?   ?Leaking urine (pee) can cause soreness from skin rashes and redness.  ?This is caused by skin being exposed to urine (pee).  ?  ?06/23/21: overactive bladder with some leakage-no change ?  ?  RNCM: Track and Manage My Symptoms-Depression     ?  Timeframe:   Long-Range Goal ?Priority:  High ?Start Date:    12-02-2020                         ?Expected End Date:   ongoing               ? ?Follow Up Date: 08/21/21 ?  ?- avoid negative self-talk ?- develop a personal safety plan ?- develop a plan to deal with triggers like holidays, anniversaries ?- exercise at least 2 to 3 times per week ?- have a plan for how to handle bad days ?- journal feelings and what helps to feel better or worse ?- spend time or talk with others at least 2 to 3 times per week ?- spend time or talk with others every day ?- watch for early signs of feeling worse  ?  ?Why is this important?   ?Keeping track of your progress will help your treatment team find the right mix of medicine and therapy for you.  ?Write in your journal every day.  ?Day-to-day changes in depression symptoms are normal. It may be more helpful to check your progress at the end of each week instead of every day.   ? 06/23/21: declines SW services ?  ?  RNCM: Track and Manage My Triggers-COPD     ?  Timeframe:  Long-Range Goal ?Priority:  Medium ?Start Date:     05-22-2020                        ?  Expected End Date:   ongoing         ? ?Follow Up Date: 08/21/21 ?  ?- avoid second hand smoke ?- eliminate smoking in my home ?- identify and avoid work-related triggers ?- identify and remove indoor air pollutants ?- limit outdoor activity during cold weather ?- listen for public air quality announcements every day  ?  ?Why is this important?   ?Triggers are activities or things, like tobacco smoke or cold weather, that make your COPD (chronic obstructive pulmonary disease) flare-up.  ?Knowing these triggers helps you plan how to stay away from them.  ?When you cannot remove them, you can learn how to manage them.   ? ? 07/21/21:  No change in SOB, smoking 3 cigarettes a day-trying to stop  ? ?The patient verbalized understanding of instructions, educational materials, and care plan provided today and agreed to receive a mailed copy of patient  instructions, educational materials, and care plan.  ? ?The Managed Medicaid care management team will reach out to the patient again over the next 30 days.  ?The  Patient  has been provided with contact information for the Managed Medicaid care management team and has been advised to call with any health related questions or concerns.  ? ?Kathi Der RN, BSN ?[place]  [facility] ?Care Management Coordinator - Managed Medicaid High Risk ?[phone] ?  ?Following is a copy of your plan of care:  ?Care Plan : RNCM: COPD (Adult)  ?Updates made by [name], RN since 07/21/2021 12:00 AM  ?  ? ?Problem: RNCM: Psychological Adjustment to Diagnosis (COPD)   ?Priority: Medium  ?Onset Date: 05/22/2020  ?  ? ?Long-Range Goal: RNCM: Adjustment to Disease Achieved   ?Start Date: 05/22/2020  ?Expected End Date: 09/20/2021  ?Recent Progress: On track  ?Priority: High  ?Note:   ?Current Barriers:  ?Knowledge deficits related to basic understanding of COPD disease process ?Knowledge deficits related to basic COPD self care/management ?Knowledge deficit related to basic understanding of how to use inhalers and how inhaled medications work ?Knowledge deficit related to importance of energy conservation ?Limited Social Support ?Unable to independently manage COPD ?Lacks social connections ?Does not contact provider office for questions/concerns  ?07/21/21:  No change in SOB per patient -smoking 3 cigarettes a day.  Recent appt with PCP. ?Case Manager Clinical Goal(s): ?patient will report using inhalers as prescribed including rinsing mouth after use ?patient will be able to verbalize understanding of COPD action plan and when to seek appropriate levels of medical care ? patient will engage in lite exercise as tolerated to build/regain stamina and strength and reduce shortness of breath through activity tolerance ? patient will verbalize basic understanding of COPD disease process and self care activities   ?Interventions:  ?Collaboration with [name], NP regarding development and update of comprehensive plan of care as evidenced by provider attestation and co-signature ?Inter-disciplinary care team collaboration (see longitudinal plan of care) ?UNABLE to independently: manage COPD ?Provided patient with basic written and verbal COPD education on self care/management/and exacerbation prevention ?Provided patient with COPD action plan and reinforced importance of daily self assessment.  ?Provided written and verbal instructions on pursed lip breathing and utilized returned demonstration as teach back ?Provided instruction about proper use of medications used for management of COPD including inhalers ?Advised patient to self assesses COPD action plan zone and make appointment with provider if in the yellow zone for 48 hours without improvement. ?Provided patient with education about the role  of exercise in the management of COPD ?Advised patient to engage in light exercise as tolerated 3-5 days a week ?Provided education about and advised patient to utilize infection prevention strategies to reduce risk of respiratory infection. ?Patient Goals/Self-Care Activities:  ?- decision-making supported ?- depression screen reviewed ?- emotional support provided ?- family involvement promoted ?- problem-solving facilitated ?- relaxation techniques promoted ?- verbalization of feelings encouraged ?Follow Up Plan: Telephone follow up appointment with care management team member scheduled.  ? ?Problem: RNCM: Symptom Exacerbation (COPD)   ?Priority: High  ?Onset Date: 05/22/2020  ?  ? ?Long-Range Goal: RNCM: Smoking Cessation Symptom Exacerbation Prevented or Minimized   ?Start Date: 05/22/2020  ?Expected End Date: 09/20/2021  ?Recent Progress: Not on track  ?Priority: High  ?Note:   ?Current Barriers:  ?Unable to independently stop smoking  ?Does not adhere to provider recommendations re: smoking cessation  ?Lacks social  connections ?Does not contact provider office for questions/concerns ?Tobacco abuse of >40 years; currently smoking 3 cigarettes a day ?Previous quit attempts, unsuccessful several successful using patches, gum-

## 2021-07-21 NOTE — Patient Outreach (Signed)
Medicaid Managed Care   Nurse Care Manager Note  07/21/2021 Name:  Cheryl Chandler MRN:  [id number] DOB:  10-08-1965  Cheryl Chandler is an 56 y.o. year old female who is a primary patient of [name], NP.  [facility] Managed Care Coordination team was consulted for assistance with:    Chronic healthcare management needs, HTN, COPD, reflux, hypothyroidism, arthritis, anxiety/depression/PTSD, HLD, obesity, LBP, tobacco use, lymphedema, sleep apnea  Cheryl Chandler was given information about Medicaid Managed Care Coordination team services today. Cheryl Chandler Patient agreed to services and verbal consent obtained.  Engaged with patient by telephone for follow up visit in response to provider referral for case management and/or care coordination services.   Assessments/Interventions:  Review of past medical history, allergies, medications, health status, including review of consultants reports, laboratory and other test data, was performed as part of comprehensive evaluation and provision of chronic care management services.  SDOH (Social Determinants of Health) assessments and interventions performed: [name]    Flowsheet Row Most Recent Value  [name]   Physical Activity Interventions Other (Comments)  [physically unable to exercise]      Care Plan  Allergies  Allergen Reactions   Codeine Shortness Of Breath and Rash   Percocet [Oxycodone-Acetaminophen] Shortness Of Breath   Sulfa Antibiotics Shortness Of Breath and Rash   Aspirin Hives   Bee Venom     Bee stings   Darvon [Propoxyphene]     Darvocet-N 100   Latex    Oxycodone Nausea And Vomiting   Penicillins     Has patient had a PCN reaction causing immediate rash, facial/tongue/throat swelling, SOB or lightheadedness with hypotension: Unknown Has patient had a PCN reaction causing severe rash involving mucus membranes or skin necrosis: Unknown Has patient had a PCN reaction  that required hospitalization: Unknown Has patient had a PCN reaction occurring within the last 10 years: Unknown If all of the above answers are "NO", then may proceed with Cephalosporin use.   Medications Reviewed Today     Reviewed by [name], RN (Registered Nurse) on 07/21/21 at [phone]  Med List Status: <None>   Medication Order Taking? Sig Documenting Provider Last Dose Status Informant  Accu-Chek Softclix Lancets lancets [id number] No CHECK BLOOD SUGAR TWICE DAILY [name], NP Taking Active   albuterol (PROVENTIL) (2.5 MG/3ML) 0.083% nebulizer solution [id number] No INHALE CONTENTS OF 1 VIAL IN NEBULIZER EVERY 4 HOURS AS NEEDED [name], NP Taking Active   amLODipine (NORVASC) 5 MG tablet [id number] No TAKE 1 TABLET BY MOUTH ONCE DAILY [name], NP Taking Active   atorvastatin (LIPITOR) 80 MG tablet [id number] No Take 1 tablet (80 mg total) by mouth daily. [name], NP Taking Active   Blood Glucose Monitoring Suppl (ACCU-CHEK GUIDE) w/Device KIT [id number] No Use to check blood sugar 3 times a day. Aura Dials T, NP Taking Active   carvedilol (COREG) 3.125 MG tablet [id number] No Take 1 tablet (3.125 mg total) by mouth 2 (two) times daily with a meal. Caro Laroche, DO Taking Active   citalopram (CELEXA) 40 MG tablet [id number] No Take 1 tablet (40 mg total) by mouth daily. [name], NP Taking Active   EPINEPHrine 0.3 mg/0.3 mL IJ SOAJ injection [id number] No USE AS DIRECTED. [name], NP Taking Active   furosemide (LASIX) 20 MG tablet [id number] No TAKE 1 TABLET BY MOUTH ONCE DAILY IN AFTERNOON AS NEEDED LEG EDEMA [name], NP Taking Active   gabapentin (NEURONTIN) 300  MG capsule [id number] No TAKE 1 CAPSULE BY MOUTH 3 TIMES DAILY ASNEEDED (NEED OFFICE VISIT FOR FURTHER REFILLS) [name], NP Taking Active   glucose blood (ACCU-CHEK GUIDE) test strip [id number] No USE TO CHECK BLOOD SUGAR 3 TIMES DAILY AS DIRECTED  [name], NP Taking Active   hydrALAZINE (APRESOLINE) 25 MG tablet [id number]  Take 1 tablet (25 mg total) by mouth 3 (three) times daily. [name], NP  Active   levothyroxine (SYNTHROID) 50 MCG tablet [id number] No TAKE 1 TABLET BY MOUTH ONCE DAILY ON AN EMPTY STOMACH. WAIT 30 MINUTES BEFORE TAKING OTHER MEDS. [name], NP Taking Active   lisinopril (ZESTRIL) 40 MG tablet [id number] No TAKE 1 TABLET BY MOUTH ONCE DAILY [name], NP Taking Active   meloxicam (MOBIC) 15 MG tablet [id number] No TAKE 1 TABLET BY MOUTH ONCE DAILY AS NEEDED WITH FOOD OR MILK [name], NP Taking Active   methocarbamol (ROBAXIN) 750 MG tablet [id number] No TAKE 1-2 TABLETS BY MOUTH 3 TIMES DAILY AS NEEDED  Patient not taking: Reported on 06/10/2021   Aura Dials T, NP Not Taking Active   mometasone-formoterol (DULERA) 100-5 MCG/ACT AERO [id number] No INHALE 2 PUFFS BY MOUTH TWICE A DAY. RINSE MOUTH AFTER USE. Roosvelt Maser [place] Taking Active   montelukast (SINGULAIR) 10 MG tablet [id number] No TAKE 1 TABLET BY MOUTH AT BEDTIME [name], NP Taking Active   nitroGLYCERIN (NITROSTAT) 0.4 MG SL tablet [id number] No DISSOLVE 1 TABLET UNDER TONGUE AT ONSET OF CHEST PAIN. REPEAT IN 5 MIN IF NOT RESOLVED, MAX 3 DOSES. 911 IF NEEDED. [name], NP Taking Active   omeprazole (PRILOSEC) 20 MG capsule [id number] No Take 1 capsule (20 mg total) by mouth daily. [name], NP Taking Active   QUEtiapine (SEROQUEL) 25 MG tablet [id number] No TAKE 1 TABLET BY MOUTH AT BEDTIME [name], NP Taking Active   SPIRIVA HANDIHALER 18 MCG inhalation capsule [id number] No INHALE CONTENTS OF 1 CAPSULE ONCE DAILY [name], NP Taking Active   triamcinolone cream (KENALOG) 0.1 % [id number] No APPLY 1 APPLICATION TOPICALLY TWICE DAILY Marjie Skiff, NP Taking Active   TRULICITY 0.75 MG/0.5ML SOPN [id number] No INJECT 0.75MG  SUBQ ONCE A Saintclair Halsted, NP  Taking Active   Vitamin D, Ergocalciferol, (DRISDOL) 1.25 MG (50000 UNIT) CAPS capsule [id number] No TAKE 1 CAPSULE BY MOUTH EVERY 7 DAYS [name], NP Taking Active            Patient Active Problem List   Diagnosis Date Noted   Fibrocystic disease of both breasts 05/07/2019   IFG (impaired fasting glucose) 05/03/2019   Chronic venous insufficiency 11/09/2018   Overactive bladder 09/30/2018   Peripheral neuropathy 09/30/2018   Apnea 01/02/2016   Lymphedema 11/30/2014   Dyspnea on exertion 11/30/2014   Anxiety 11/29/2014   Arthritis 11/29/2014   COPD (chronic obstructive pulmonary disease) (HCC) 11/29/2014   Clinical depression 11/29/2014   H/O eating disorder 11/29/2014   Esophageal reflux 11/29/2014   Acne inversa 11/29/2014   Essential hypertension 11/29/2014   HLD (hyperlipidemia) 11/29/2014   Adult hypothyroidism 11/29/2014   Insomnia 11/29/2014   Low back pain 11/29/2014   Morbid obesity (HCC) 11/29/2014   Posttraumatic stress disorder 11/29/2014   Vitamin D deficiency 11/29/2014   Nicotine dependence, cigarettes, uncomplicated 11/29/2014   Conditions to be addressed/monitored per PCP order:  Chronic healthcare management needs, HTN, COPD, reflux, hypothyroidism, arthritis, anxiety/depression/PTSD, HLD, obesity, LBP, tobacco use, lymphedema, sleep apnea  Care Plan : RNCM: COPD (Adult)  Updates made by [name], RN since 07/21/2021 12:00 AM     Problem: RNCM: Psychological Adjustment to Diagnosis (COPD)   Priority: Medium  Onset Date: 05/22/2020     Long-Range Goal: RNCM: Adjustment to Disease Achieved   Start Date: 05/22/2020  Expected End Date: 09/20/2021  Recent Progress: On track  Priority: High  Note:   Current Barriers:  Knowledge deficits related to basic understanding of COPD disease process Knowledge deficits related to basic COPD self care/management Knowledge deficit related to basic understanding of how to use inhalers and how inhaled  medications work Knowledge deficit related to importance of energy conservation Limited Social Support Unable to independently manage COPD Lacks social connections Does not contact provider office for questions/concerns  07/21/21:  No change in SOB per patient -smoking 3 cigarettes a day.  Recent appt with PCP. Case Manager Clinical Goal(s): patient will report using inhalers as prescribed including rinsing mouth after use patient will be able to verbalize understanding of COPD action plan and when to seek appropriate levels of medical care  patient will engage in lite exercise as tolerated to build/regain stamina and strength and reduce shortness of breath through activity tolerance  patient will verbalize basic understanding of COPD disease process and self care activities  Interventions:  Collaboration with [name], NP regarding development and update of comprehensive plan of care as evidenced by provider attestation and co-signature Inter-disciplinary care team collaboration (see longitudinal plan of care) UNABLE to independently: manage COPD Provided patient with basic written and verbal COPD education on self care/management/and exacerbation prevention Provided patient with COPD action plan and reinforced importance of daily self assessment.  Provided written and verbal instructions on pursed lip breathing and utilized returned demonstration as teach back Provided instruction about proper use of medications used for management of COPD including inhalers Advised patient to self assesses COPD action plan zone and make appointment with provider if in the yellow zone for 48 hours without improvement. Provided patient with education about the role of exercise in the management of COPD Advised patient to engage in light exercise as tolerated 3-5 days a week Provided education about and advised patient to utilize infection prevention strategies to reduce risk of respiratory  infection. Patient Goals/Self-Care Activities:  - decision-making supported - depression screen reviewed - emotional support provided - family involvement promoted - problem-solving facilitated - relaxation techniques promoted - verbalization of feelings encouraged Follow Up Plan: Telephone follow up appointment with care management team member scheduled.   Problem: RNCM: Symptom Exacerbation (COPD)   Priority: High  Onset Date: 05/22/2020     Long-Range Goal: RNCM: Smoking Cessation Symptom Exacerbation Prevented or Minimized   Start Date: 05/22/2020  Expected End Date: 09/20/2021  Recent Progress: Not on track  Priority: High  Note:   Current Barriers:  Unable to independently stop smoking  Does not adhere to provider recommendations re: smoking cessation  Lacks social connections Does not contact provider office for questions/concerns Tobacco abuse of >40 years; currently smoking 3 cigarettes a day Previous quit attempts, unsuccessful several successful using patches, gum- has a pattern of stopping and then starting back  Reports smoking within 30 minutes of waking up Reports triggers to smoke include: stress, chronic conditions  Reports motivation to quit smoking includes: knows it will help her breathing and other chronic conditions  On a scale of 1-10, reports MOTIVATION to quit is 8 On a scale of 1-10, reports CONFIDENCE in quitting is 7 07/21/21:  patient has reduced smoking  to 3 cigarettes a day-positive reinforcement given to patient.  States she is trying to stop. Clinical Goal(s):  patient will work with RN Case Solicitor and provider towards tobacco cessation  Interventions: Collaboration with [name], NP regarding development and update of comprehensive plan of care as evidenced by provider attestation and co-signature Inter-disciplinary care team collaboration (see longitudinal plan of care) Evaluation of current treatment  plan reviewed.  Provided contact information for [place] [facility] (1-800-QUIT-NOW). Patient will outreach this group for support. Update 01/22/21:  Patient states she has utilized [employer] for resources. Discussed plans with patient for ongoing care management follow up and provided patient with direct contact information for care management team Provided patient with printed smoking cessation educational materials Provided contact information for [place] [facility] (1-800-QUIT-NOW). Patient will outreach this group for support. Patient Goals/Self-Care Activities patient will:  - mindfulness, talking to family and friends as a habit during cravings  - verbally commit to reducing tobacco consumption - barriers to lifestyle changes reviewed and addressed - barriers to treatment reviewed and addressed - breathing techniques encouraged - modification of home and work environment promoted - rescue (action) plan reviewed - signs/symptoms of infection reviewed - signs/symptoms of worsening disease assessed - treatment plan reviewed Follow Up Plan: Telephone follow up appointment with care management team member scheduled.    Care Plan : RNCM: Hypertension (Adult)  Updates made by [name], RN since 07/21/2021 12:00 AM     Problem: RNCM: Hypertension (Hypertension)   Priority: Medium  Onset Date: 05/22/2020     Long-Range Goal: RNCM: Hypertension Monitored   Start Date: 05/22/2020  Expected End Date: 09/20/2021  Recent Progress: On track  Priority: Medium  Note:    Current Barriers:  Knowledge Deficits related to basic understanding of hypertension pathophysiology and self care management Knowledge Deficits related to understanding of medications prescribed for management of hypertension Limited Social Support Unable to independently manage HTN Lacks social connections Does not contact provider office for questions/concern 07/21/21:  patient seen and evaluated by PCP 07/02/21-medications  adjusted again.  Patient states she checks  blood pressure three times a day-seems a little confused about readings, 170s-180s over 70s-80s.  Blood pressure at PCP 07/02/21-148/65. Case Manager Clinical Goal(s):   patient will verbalize understanding of plan for hypertension management patient will demonstrate improved adherence to prescribed treatment plan for hypertension as evidenced by taking all medications as prescribed, monitoring and recording blood pressure as directed, adhering to low sodium/DASH diet patient will demonstrate improved health management independence as evidenced by checking blood pressure as directed and notifying PCP if SBP>160 or DBP > 90, taking all medications as prescribe, and adhering to a low sodium diet as discussed. Interventions:  Collaboration with [name], NP regarding development and update of comprehensive plan of care as evidenced by provider attestation and co-signature Inter-disciplinary care team collaboration (see longitudinal plan of care) Evaluation of current treatment plan related to hypertension self management and patient's adherence to plan as established by provider.  Provided education to patient re: stroke prevention, s/s of heart attack and stroke, DASH diet, complications of uncontrolled blood pressure Reviewed medications with patient and discussed importance of compliance.  Discussed plans with patient for ongoing care management follow up and provided patient with direct contact information for care management team Advised patient, providing education and rationale, to monitor blood pressure daily and record, calling PCP for findings outside established parameters.  Patient Goals/Self-Care Activities Over the next  120 days, patient will:  - Self administers medications as prescribed Attends all scheduled provider appointments Calls provider office for new concerns, questions, or BP outside discussed parameters Checks BP and records  as discussed Follows a low sodium diet/DASH diet - blood pressure trends reviewed - depression screen reviewed - home or ambulatory blood pressure monitoring encouraged Follow Up Plan: Telephone follow up appointment with care management team member scheduled.  Transitioning to [facility] team. Will follow up with them at scheduled time.    Care Plan : RNCM: Depression (Adult)  Updates made by [name], RN since 07/21/2021 12:00 AM     Problem: RNCM: Symptoms (Depression)   Priority: High  Onset Date: 12/02/2020     Long-Range Goal: RNCM: Symptoms Monitored and Managed   Start Date: 12/02/2020  Expected End Date: 08/22/2022  Recent Progress: Not on track  Priority: High  Note:   Current Barriers:  Ineffective Self Health Maintenance in a patient with Anxiety and Depression Unable to independently manage depression and anxiety as evidence of multiple chronic conditions and the patient feeling her health is out of control  Does not attend all scheduled provider appointments Lacks social connections Unable to perform IADLs independently Does not contact provider office for questions/concerns 07/21/21:  No change in symptoms per patient-denies need for services right now. Clinical Goal(s):  Collaboration with [name], NP regarding development and update of comprehensive plan of care as evidenced by provider attestation and co-signature Inter-disciplinary care team collaboration (see longitudinal plan of care) patient will work with care management team to address care coordination and chronic disease management needs related to Disease Management Educational Needs Care Coordination Mental Health Counseling Level of Care Concerns   Interventions:  Evaluation of current treatment plan related to Anxiety and Depression, Financial constraints related to insurance not approving care for the patient to go to wound care , Limited social support, Level of care concerns, ADL IADL  limitations, Mental Health Concerns , Family and relationship dysfunction, Substance abuse issues -  smoker , and Inability to perform IADL's independently self-management and patient's adherence to plan as established by provider. Collaboration with [name], NP regarding development and update of comprehensive plan of care as evidenced by provider attestation       and co-signature Inter-disciplinary care team collaboration (see longitudinal plan of care) Discussed plans with patient for ongoing care management follow up and provided patient with direct contact information for care management team Evaluation of depression and anxiety.   Self Care Activities:  Patient verbalizes understanding of plan to effectively manage depression and anxiety Self administers medications as prescribed Attends all scheduled provider appointments Calls pharmacy for medication refills Attends church or other social activities Performs ADL's independently Performs IADL's independently Calls provider office for new concerns or questions Work with the [facility] team to meet needs for continued CCM services  Patient Goals: - activity or exercise based on tolerance encouraged - depression screen reviewed - emotional support provided - healthy lifestyle promoted - medication side effects monitored and managed - pain managed - participation in mental health treatment encouraged - quality of sleep assessed - response to mental health treatment monitored - response to pharmacologic therapy monitored - sleep hygiene techniques encouraged - social activities and relationships encouraged - substance use assessed Follow Up Plan: The care management team will reach out to the patient again over the next 30 to 60 days.    Follow Up:  Patient agrees to Care Plan and Follow-up.  Plan:  The Managed Medicaid care management team will reach out to the patient again over the next 30 days. and The  Patient has been  provided with contact information for the Managed Medicaid care management team and has been advised to call with any health related questions or concerns.  Date/time of next scheduled RN care management/care coordination outreach:  08/26/21 at 1030.

## 2021-07-22 ENCOUNTER — Other Ambulatory Visit: Payer: Self-pay | Admitting: Pharmacist

## 2021-07-22 NOTE — Patient Outreach (Signed)
? ?   ? ?Chief Complaint  ?Patient presents with  ? High Risk Managed Medicaid  ? ? ?Cheryl Chandler is a 56 y.o. year old female who was referred for medication management by their primary care provider, [name], NP. They presented for a telephone visit in the context of the COVID-19 pandemic. ?  ?They were referred to the pharmacist by their High Risk Managed Medicaid Care Team  for assistance in managing diabetes, hypertension, and hyperlipidemia.  ? ?Subjective: ?Pre-diabetes: ? ?Current medications: Trulicity 3.53 mg weekly  ?Medications tried in the past: Ozempic - too much appetite suppression ? ?Blood glucose readings: fastings and pre-preprandials; 110-150s pending what she eats; most often <130 ?Using Accu-Chek meter - checking 2-3 times daily  ? ?Hypertension: ? ?Current medications: lisinopril 40 mg daily, hydralazine 25 mg three times daily, amlodipine 5 mg daily, furosemide 20 mg daily; carvedilol 3.125 mg prescribed but she has not filled since 2021 ? ?Current blood pressure readings readings:  ?- 3/7 - 141/66, HR 60 ?- 3/24 - 185/85, HR 80 ?- 3/25 - 185/75, HR 80 ?- 3/26 - 195/95, HR 90  ?- 3/27 - 165/65, HR 60 ? ?Patient denies hypotensive s/sx including dizziness, lightheadedness. Patient reports hypertensive symptoms including headache, chest pain, shortness of breath.  ? ?Dietary patterns: reports she uses low sodium seasoning; reports food security right now.  ? ?Hyperlipidemia/ASCVD Risk Reduction ? ?Current lipid lowering medications: atorvastatin 80 mg daily ? ?Medication Review: ?- Reviewed fill history. She has not been taking quetiapine since 2021. Reports she does have a hard time sleeping, but is not interested in pharmacotherapy at this time. Removed from list.  ? ?Tobacco Abuse: ? ?Tobacco Use History: ?Number of cigarettes per day 3-4 ?Triggers include stress ? ?Quit Attempt History: ?Methods tried in the past include Nicotine Gum.  ?Motivators to quitting include  health; barriers include under a lot of stress now ?Reports she previously called Quitline for Nicotine Replacement Therapy, but did not have to use. Is contemplating calling again for support and replacement therapy.  ? ?Objective: ?Lab Results  ?Component Value Date  ? HGBA1C 5.9 (H) 06/04/2021  ? ? ?Lab Results  ?Component Value Date  ? CREATININE 0.78 06/04/2021  ? BUN 20 06/04/2021  ? NA 140 06/04/2021  ? K 4.4 06/04/2021  ? CL 102 06/04/2021  ? CO2 21 06/04/2021  ? ? ?Lab Results  ?Component Value Date  ? CHOL 114 06/04/2021  ? HDL 35 (L) 06/04/2021  ? [place] 62 06/04/2021  ? TRIG 85 06/04/2021  ? CHOLHDL 3.3 06/04/2021  ? ? ?Medications Reviewed Today   ? ? Reviewed by De Hollingshead, RPH-CPP (Pharmacist) on 07/22/21 at 1320  Med List Status: <None>  ? ?Medication Order Taking? Sig Documenting Provider Last Dose Status Informant  ?Accu-Chek Softclix Lancets lancets [id number] Yes CHECK BLOOD SUGAR TWICE DAILY [name], NP Taking Active   ?albuterol (PROVENTIL) (2.5 MG/3ML) 0.083% nebulizer solution [id number] Yes INHALE CONTENTS OF 1 VIAL IN NEBULIZER EVERY 4 HOURS AS NEEDED [name], NP Taking Active   ?amLODipine (NORVASC) 5 MG tablet [id number] Yes TAKE 1 TABLET BY MOUTH ONCE DAILY [name], NP Taking Active   ?atorvastatin (LIPITOR) 80 MG tablet [id number] Yes Take 1 tablet (80 mg total) by mouth daily. [name], NP Taking Active   ?Blood Glucose Monitoring Suppl (ACCU-CHEK GUIDE) w/Device KIT [id number] Yes Use to check blood sugar 3 times a day. [name], NP Taking Active   ?citalopram (CELEXA) 40  MG tablet [id number] Yes Take 1 tablet (40 mg total) by mouth daily. [name], NP Taking Active   ?EPINEPHrine 0.3 mg/0.3 mL IJ SOAJ injection [id number]  USE AS DIRECTED. [name], NP  Active   ?furosemide (LASIX) 20 MG tablet [id number] Yes TAKE 1 TABLET BY MOUTH ONCE DAILY IN AFTERNOON AS NEEDED LEG EDEMA [name], NP Taking Active    ?gabapentin (NEURONTIN) 300 MG capsule [id number] Yes TAKE 1 CAPSULE BY MOUTH 3 TIMES DAILY ASNEEDED (NEED OFFICE VISIT FOR FURTHER REFILLS) [name], NP Taking Active   ?glucose blood (ACCU-CHEK GUIDE) test strip [id number]  USE TO CHECK BLOOD SUGAR 3 TIMES DAILY AS DIRECTED [name], NP  Active   ?hydrALAZINE (APRESOLINE) 25 MG tablet [id number] Yes Take 1 tablet (25 mg total) by mouth 3 (three) times daily. [name], NP Taking Active   ?levothyroxine (SYNTHROID) 50 MCG tablet [id number] Yes TAKE 1 TABLET BY MOUTH ONCE DAILY ON AN EMPTY STOMACH. WAIT 30 MINUTES BEFORE TAKING OTHER MEDS. [name], NP Taking Active   ?lisinopril (ZESTRIL) 40 MG tablet [id number] Yes TAKE 1 TABLET BY MOUTH ONCE DAILY [name], NP Taking Active   ?meloxicam ([place]) 15 MG tablet [id number] Yes TAKE 1 TABLET BY MOUTH ONCE DAILY AS NEEDED WITH FOOD OR MILK [name], NP Taking Active   ?methocarbamol (ROBAXIN) 750 MG tablet [id number] No TAKE 1-2 TABLETS BY MOUTH 3 TIMES DAILY AS NEEDED  ?Patient not taking: Reported on 06/10/2021  ? [name], NP Not Taking Active   ?mometasone-formoterol (DULERA) 100-5 MCG/ACT AERO [id number] Yes INHALE 2 PUFFS BY MOUTH TWICE A DAY. RINSE MOUTH AFTER USE. [name], [place] Taking Active   ?montelukast (SINGULAIR) 10 MG tablet [id number] Yes TAKE 1 TABLET BY MOUTH AT BEDTIME [name], NP Taking Active   ?nitroGLYCERIN (NITROSTAT) 0.4 MG SL tablet [id number]  DISSOLVE 1 TABLET UNDER TONGUE AT ONSET OF CHEST PAIN. REPEAT IN 5 MIN IF NOT RESOLVED, MAX 3 DOSES. 911 IF NEEDED. [name], NP  Active   ?omeprazole (PRILOSEC) 20 MG capsule [id number] Yes Take 1 capsule (20 mg total) by mouth daily. [name], NP Taking Active   ?SPIRIVA HANDIHALER 18 MCG inhalation capsule [id number] Yes INHALE CONTENTS OF 1 CAPSULE ONCE DAILY [name], NP Taking Active   ?triamcinolone cream (KENALOG) 0.1 % [id number] Yes APPLY 1  APPLICATION TOPICALLY TWICE DAILY [name], [name], NP Taking Active   ?TRULICITY 3.35 KT/6.2BW SOPN [id number] Yes INJECT 0.75MG SUBQ ONCE A WEEK [name], NP Taking Active   ?Vitamin D, Ergocalciferol, (DRISDOL) 1.25 MG (50000 UNIT) CAPS capsule [id number] Yes TAKE 1 CAPSULE BY MOUTH EVERY 7 DAYS [name], NP Taking Active   ? ?  ?  ? ?  ? ? ?Assessment/Plan:  ?Care Plan : Medication Management  ?Updates made by De Hollingshead, RPH-CPP since 07/22/2021 12:00 AM  ?  ? ?Problem: Hypertension   ?  ? ?Long-Range Goal: BP <140/90   ?Note:   ?Current Barriers:  ?Unable to achieve control of hypertension  ? ?Patient Needs: ?Medication adherence support ? ?Patient Activities: ?Patient will:  ?- take medications as prescribed as evidenced by patient report and record review ?check blood pressure daily, document, and provide at future appointments ? ?  ? ? ?Pre-diabetes: ?- Currently controlled ?- Recommended to continue current regimen at this time ? ?Hypertension: ?- Currently uncontrolled ?- Reviewed long term cardiovascular and renal outcomes of uncontrolled blood pressure ?- Home readings are much higher than office readings. Patient  reports she has had her home BP machine for several years. Recommend ordering a new machine, as Medicaid covers as DME. Will collaborate w/ PCP on placing this order to [employer] to mail correct cuff size to patient . ?- Patient has not been taking carvedilol. No compelling indication for beta blocker noted. No history of thiazide diuretic noted. Recommend initiation of chlorthalidone, counsel on reducing furosemide use to PRN.  ?- Reviewed appropriate blood pressure monitoring technique and reviewed goal blood pressure. Recommended to check home blood pressure and heart rate daily once new cuff is received ?- Praised for avoidance of sodium.  ? ? ? ? ?Hyperlipidemia/ASCVD Risk Reduction: ?- Currently controlled.  ?- Recommend to continue current  regimen at this time ? ?Follow Up Plan: follow up in 2 weeks ? ?[name], PharmD, BCACP ?[place] ?[phone] ? ? ? ?

## 2021-07-22 NOTE — Patient Instructions (Signed)
It was great to speak with you today! ? ?I will talk to Cheryl Chandler about placing an order for the new blood pressure machine.  ? ?Check your blood pressure daily . Our goal is less than 140/90. ? ?We recommend a blood pressure cuff that goes around your upper arm, as these are generally the most accurate.  ? ?To appropriately check your blood pressure, make sure you do the following:  ?1) Avoid caffeine, exercise, or tobacco products for 30 minutes before checking. ?2) Sit with your back supported in a flat-backed chair. Rest your arm on something flat (arm of the chair, table, etc). ?3) Sit still with your feet flat on the floor, resting, for at least 5 minutes.  ?4) Check your blood pressure. Take 1-2 readings.  ? ?Write down these readings and bring with you to any provider appointments. Bring your home blood pressure machine with you to a provider's office for accuracy comparison at least once a year. ? ?Take care! ? ?Catie Feliz Beam, PharmD ? ?Visit Information ? ?Ms. Chea was given information about Medicaid Managed Care team care coordination services as a part of their [facility] Medicaid benefit. Shaniqua Rhen Dossantos verbally consented to engagement with the [facility] Managed Care team.  ? ?If you are experiencing a medical emergency, please call 911 or report to your local emergency department or urgent care.  ? ?If you have a non-emergency medical problem during routine business hours, please contact your provider's office and ask to speak with a nurse.  ? ?For questions related to your [facility] health plan, please call: [phone] or go here:[email or web address] ? ?If you would like to schedule transportation through your [facility] plan, please call the following number at least 2 days in advance of your appointment: [phone]. ? You can also use the MTM portal or MTM mobile app to manage your rides. For the portal, please go to [email or web address] ? ? ?

## 2021-07-23 ENCOUNTER — Telehealth: Payer: Self-pay | Admitting: Nurse Practitioner

## 2021-07-23 ENCOUNTER — Telehealth: Payer: Self-pay | Admitting: Pharmacist

## 2021-07-23 DIAGNOSIS — I10 Essential (primary) hypertension: Secondary | ICD-10-CM

## 2021-07-23 MED ORDER — OMRON 3 SERIES BP MONITOR DEVI: 0 refills | Status: AC

## 2021-07-23 NOTE — Telephone Encounter (Signed)
Called pt to see if she could come in and pt states that due to her husband getting surgery she is unable to come in earlier. Pt also needs assistance finding an eye doctor who can perform the diabetic eye exam. She states that everywhere she has called doesn't take [facility]. Please advise pt on where she could go to get this done. ?

## 2021-07-23 NOTE — Telephone Encounter (Signed)
-----   Message from [name], NP sent at 07/23/2021  7:54 AM EDT ----- ?Can we move her appointment up to the last week of April. ? ?[name] ?----- Message ----- ?From: Lourena Simmonds, RPH-CPP ?Sent: 07/22/2021   1:36 PM EDT ?To: [name], NP ? ?Hi [name],  ?As I said in the secure chat, patient may benefit from a new home BP cuff. I'd be happy to place this order under you and coordinate getting the script to the DME company that will mail it to her.  ? ?She has actually not filled carvedilol since 2021, so is taking lisinopril 40 mg, amlodipine 5 mg, and hydralazine 25 mg TID now. I don't see a compelling indication for beta blocker, so I'd actually consider adding a thiazide diuretic like chlorthalidone (with counseling that she may not need furosemide daily anymore).  ? ?I plan to check in with her in 2 weeks to follow up on home BP readings. Thanks! ? ?Catie Feliz Beam, PharmD, BCACP ?[facility] ? ? ?

## 2021-07-23 NOTE — Telephone Encounter (Signed)
The Managed Medicaid social worker plans to discuss eye exam resources covered by [facility] with her at their outreach on Friday.  ?

## 2021-07-23 NOTE — Patient Outreach (Signed)
Per [name], NP, order placed for BP machine.  ? ?Please print prescription for BP machine, have PCP sign, and fax to [employer], attn [place] - fax - [phone]. They will bill the script under Medicaid and mail an appropriately sized cuff to the patient.  ? ?Thanks!  ?

## 2021-07-25 ENCOUNTER — Other Ambulatory Visit: Payer: Self-pay

## 2021-07-25 NOTE — Patient Instructions (Signed)
Visit Information ? ?[name] was given information about Medicaid Managed Care [place] care coordination services as a part of their [facility] Medicaid benefit. Cheryl Chandler verbally consented to engagement with the [facility] Managed Care [place].  ? ?If you are experiencing a medical emergency, please call 911 or report to your local emergency department or urgent care.  ? ?If you have a non-emergency medical problem during routine business hours, please contact your provider's office and ask to speak with a nurse.  ? ?For questions related to your [facility] plan, please call: [phone] or go here:[email or web address] ? ?If you would like to schedule transportation through your [facility] plan, please call the following number at least 2 days in advance of your appointment: [phone]. ? You can also use the MTM portal or MTM mobile app to manage your rides. For the portal, please go to [email or web address] ? ?Call the [facility] Crisis Line at [phone], at any time, 24 hours a day, 7 days a week. If you are in danger or need immediate medical attention call 911. ? ?If you would like help to quit smoking, call 1-800-QUIT-NOW ((604)320-0405) OR Espa?ol: 1-855-D?jelo-Ya 4587512536) o para m?s informaci?n haga clic aqu? or Text READY to 200-400 to register via text ? ?[name] - following are the goals we discussed in your visit today:  ? Goals Addressed   ?None ?  ? ? ? ? ?Social Worker will follow up with patient in 30 days .  ? ?Cheryl Chandler, Cheryl Chandler, Cheryl Chandler ?Cheryl Chandler  ?Cheryl Chandler  ?[phone]  ? ?Following is a copy of your plan of care:  ?There are no care plans that you recently modified to display for this patient. ?  ?

## 2021-07-25 NOTE — Patient Outreach (Signed)
?Medicaid Managed Care ?Social Work Note ? ?07/25/2021 ?Name:  Cheryl Chandler MRN:  [id number] DOB:  1965-05-06 ? ?Cheryl Chandler is an 56 y.o. year old female who is a primary patient of Cheryl Billings, NP.  The [facility] Managed Care Coordination team was consulted for assistance with:   eye doctors  ? ?[name] was given information about Medicaid Managed Care Coordination team services today. [place] Patient agreed to services and verbal consent obtained. ? ?Engaged with patient  for by telephone forfollow up visit in response to referral for case management and/or care coordination services.  ? ?Assessments/Interventions:  Review of past medical history, allergies, medications, health status, including review of consultants reports, laboratory and other test data, was performed as part of comprehensive evaluation and provision of chronic care management services. ? ?SDOH: (Social Determinant of Health) assessments and interventions performed: ?BSW completed telephone outreach with patient regarding eye doctors. Patient stated that she misplaced the letter with the eye doctors on them, but she is going to look for it. BSW provided patient with some additional eye doctors to contact to schedule her appointment. [facility] accept [facility] for routine exams. Patient states no other resources are needed at this time. ? ?Advanced Directives Status:  Not addressed in this encounter. ? ?Care Plan ?                ?Allergies  ?Allergen Reactions  ? Codeine Shortness Of Breath and Rash  ? Percocet [Oxycodone-Acetaminophen] Shortness Of Breath  ? Sulfa Antibiotics Shortness Of Breath and Rash  ? Aspirin Hives  ? Bee Venom   ?  Bee stings  ? Darvon [Propoxyphene]   ?  Darvocet-N 100  ? Latex   ? Oxycodone Nausea And Vomiting  ? Penicillins   ?  Has patient had a PCN reaction causing immediate rash, facial/tongue/throat swelling, SOB or lightheadedness with hypotension: Unknown ?Has  patient had a PCN reaction causing severe rash involving mucus membranes or skin necrosis: Unknown ?Has patient had a PCN reaction that required hospitalization: Unknown ?Has patient had a PCN reaction occurring within the last 10 years: Unknown ?If all of the above answers are "NO", then may proceed with Cephalosporin use.  ? ? ?Medications Reviewed Today   ? ? Reviewed by De Hollingshead, RPH-CPP (Pharmacist) on 07/22/21 at 1320  Med List Status: <None>  ? ?Medication Order Taking? Sig Documenting Provider Last Dose Status Informant  ?Accu-Chek Softclix Lancets lancets [id number] Yes CHECK BLOOD SUGAR TWICE DAILY Cheryl Billings, NP Taking Active   ?albuterol (PROVENTIL) (2.5 MG/3ML) 0.083% nebulizer solution [id number] Yes INHALE CONTENTS OF 1 VIAL IN NEBULIZER EVERY 4 HOURS AS NEEDED Cheryl Billings, NP Taking Active   ?amLODipine (NORVASC) 5 MG tablet [id number] Yes TAKE 1 TABLET BY MOUTH ONCE DAILY Cheryl Billings, NP Taking Active   ?atorvastatin (LIPITOR) 80 MG tablet [id number] Yes Take 1 tablet (80 mg total) by mouth daily. Cheryl Billings, NP Taking Active   ?Blood Glucose Monitoring Suppl (ACCU-CHEK GUIDE) w/Device KIT [id number] Yes Use to check blood sugar 3 times a day. [name], NP Taking Active   ?citalopram (CELEXA) 40 MG tablet [id number] Yes Take 1 tablet (40 mg total) by mouth daily. Cheryl Billings, NP Taking Active   ?EPINEPHrine 0.3 mg/0.3 mL IJ SOAJ injection [id number]  USE AS DIRECTED. Cheryl Billings, NP  Active   ?furosemide (LASIX) 20 MG tablet [id number] Yes TAKE 1 TABLET BY MOUTH ONCE DAILY IN AFTERNOON AS NEEDED LEG EDEMA Mathis Dad,  Santiago Glad, NP Taking Active   ?gabapentin (NEURONTIN) 300 MG capsule [id number] Yes TAKE 1 CAPSULE BY MOUTH 3 TIMES DAILY ASNEEDED (NEED OFFICE VISIT FOR FURTHER REFILLS) Cheryl Billings, NP Taking Active   ?glucose blood (ACCU-CHEK GUIDE) test strip [id number]  USE TO CHECK BLOOD SUGAR 3 TIMES DAILY AS DIRECTED Cheryl Billings, NP  Active    ?hydrALAZINE (APRESOLINE) 25 MG tablet [id number] Yes Take 1 tablet (25 mg total) by mouth 3 (three) times daily. Cheryl Billings, NP Taking Active   ?levothyroxine (SYNTHROID) 50 MCG tablet [id number] Yes TAKE 1 TABLET BY MOUTH ONCE DAILY ON AN EMPTY STOMACH. WAIT 30 MINUTES BEFORE TAKING OTHER MEDS. Cheryl Billings, NP Taking Active   ?lisinopril (ZESTRIL) 40 MG tablet [id number] Yes TAKE 1 TABLET BY MOUTH ONCE DAILY Cheryl Billings, NP Taking Active   ?meloxicam ([place]) 15 MG tablet [id number] Yes TAKE 1 TABLET BY MOUTH ONCE DAILY AS NEEDED WITH FOOD OR MILK Cheryl Billings, NP Taking Active   ?methocarbamol (ROBAXIN) 750 MG tablet [id number] No TAKE 1-2 TABLETS BY MOUTH 3 TIMES DAILY AS NEEDED  ?Patient not taking: Reported on 06/10/2021  ? [name], NP Not Taking Active   ?mometasone-formoterol (DULERA) 100-5 MCG/ACT AERO [id number] Yes INHALE 2 PUFFS BY MOUTH TWICE A DAY. RINSE MOUTH AFTER USE. [name], [place] Taking Active   ?montelukast (SINGULAIR) 10 MG tablet [id number] Yes TAKE 1 TABLET BY MOUTH AT BEDTIME Cheryl Billings, NP Taking Active   ?nitroGLYCERIN (NITROSTAT) 0.4 MG SL tablet [id number]  DISSOLVE 1 TABLET UNDER TONGUE AT ONSET OF CHEST PAIN. REPEAT IN 5 MIN IF NOT RESOLVED, MAX 3 DOSES. 911 IF NEEDED. Cheryl Billings, NP  Active   ?omeprazole (PRILOSEC) 20 MG capsule [id number] Yes Take 1 capsule (20 mg total) by mouth daily. Cheryl Billings, NP Taking Active   ?SPIRIVA HANDIHALER 18 MCG inhalation capsule [id number] Yes INHALE CONTENTS OF 1 CAPSULE ONCE DAILY Cheryl Billings, NP Taking Active   ?triamcinolone cream (KENALOG) 0.1 % [id number] Yes APPLY 1 APPLICATION TOPICALLY TWICE DAILY [name], [name], NP Taking Active   ?TRULICITY 0.86 PY/1.9JK SOPN [id number] Yes INJECT 0.75MG SUBQ ONCE A WEEK Cheryl Billings, NP Taking Active   ?Vitamin D, Ergocalciferol, (DRISDOL) 1.25 MG (50000 UNIT) CAPS capsule [id number] Yes TAKE 1 CAPSULE BY MOUTH EVERY 7 DAYS Cheryl Billings, NP Taking Active   ? ?  ?  ? ?  ? ? ?Patient Active Problem List  ? Diagnosis Date Noted  ? Fibrocystic disease of both breasts 05/07/2019  ? IFG (impaired fasting glucose) 05/03/2019  ? Chronic venous insufficiency 11/09/2018  ? Overactive bladder 09/30/2018  ? Peripheral neuropathy 09/30/2018  ? Apnea 01/02/2016  ? Lymphedema 11/30/2014  ? Dyspnea on exertion 11/30/2014  ? Anxiety 11/29/2014  ? Arthritis 11/29/2014  ? COPD (chronic obstructive pulmonary disease) ([place]) 11/29/2014  ? Clinical depression 11/29/2014  ? H/O eating disorder 11/29/2014  ? Esophageal reflux 11/29/2014  ? Acne inversa 11/29/2014  ? Essential hypertension 11/29/2014  ? HLD (hyperlipidemia) 11/29/2014  ? Adult hypothyroidism 11/29/2014  ? Insomnia 11/29/2014  ? Low back pain 11/29/2014  ? Morbid obesity ([place]) 11/29/2014  ? Posttraumatic stress disorder 11/29/2014  ? Vitamin D deficiency 11/29/2014  ? Nicotine dependence, cigarettes, uncomplicated 50/53/9767  ? ? ?Conditions to be addressed/monitored per PCP order:   eye doctors  ? ?There are no care plans that you recently modified to display for this patient. ? ? ?Follow up:  Patient agrees to Care Plan and Follow-up. ? ?Plan: The Managed Medicaid  care management team will reach out to the patient again over the next 30 days. ? ?Date/time of next scheduled Social Work care management/care coordination outreach:  08/22/21 ? ?Mickel Fuchs, BSW, [place] ?[place]  ?High Risk Managed Medicaid Team  ?[phone]  ?

## 2021-07-26 DIAGNOSIS — Z419 Encounter for procedure for purposes other than remedying health state, unspecified: Secondary | ICD-10-CM | POA: Diagnosis not present

## 2021-07-28 ENCOUNTER — Emergency Department: Payer: Medicaid Other

## 2021-07-28 ENCOUNTER — Emergency Department
Admission: EM | Admit: 2021-07-28 | Discharge: 2021-07-28 | Disposition: A | Discharge status: Home / Self Care | Payer: Medicaid Other | Attending: Emergency Medicine | Admitting: Emergency Medicine

## 2021-07-28 ENCOUNTER — Other Ambulatory Visit: Payer: Self-pay

## 2021-07-28 ENCOUNTER — Encounter: Payer: Self-pay | Admitting: Emergency Medicine

## 2021-07-28 DIAGNOSIS — R9431 Abnormal electrocardiogram [ECG] [EKG]: Secondary | ICD-10-CM | POA: Diagnosis not present

## 2021-07-28 DIAGNOSIS — M79661 Pain in right lower leg: Secondary | ICD-10-CM | POA: Insufficient documentation

## 2021-07-28 DIAGNOSIS — M62838 Other muscle spasm: Secondary | ICD-10-CM | POA: Insufficient documentation

## 2021-07-28 DIAGNOSIS — R55 Syncope and collapse: Secondary | ICD-10-CM | POA: Diagnosis not present

## 2021-07-28 DIAGNOSIS — I10 Essential (primary) hypertension: Secondary | ICD-10-CM | POA: Diagnosis not present

## 2021-07-28 DIAGNOSIS — M79604 Pain in right leg: Secondary | ICD-10-CM

## 2021-07-28 DIAGNOSIS — R600 Localized edema: Secondary | ICD-10-CM | POA: Diagnosis not present

## 2021-07-28 DIAGNOSIS — I959 Hypotension, unspecified: Secondary | ICD-10-CM | POA: Diagnosis not present

## 2021-07-28 DIAGNOSIS — J449 Chronic obstructive pulmonary disease, unspecified: Secondary | ICD-10-CM | POA: Diagnosis not present

## 2021-07-28 DIAGNOSIS — R0602 Shortness of breath: Secondary | ICD-10-CM | POA: Insufficient documentation

## 2021-07-28 DIAGNOSIS — Z743 Need for continuous supervision: Secondary | ICD-10-CM | POA: Diagnosis not present

## 2021-07-28 LAB — COMPREHENSIVE METABOLIC PANEL
ALT: 20 U/L (ref 0–44)
AST: 17 U/L (ref 15–41)
Albumin: 3.5 g/dL (ref 3.5–5.0)
Alkaline Phosphatase: 95 U/L (ref 38–126)
Anion gap: 9 (ref 5–15)
BUN: 12 mg/dL (ref 6–20)
CO2: 29 mmol/L (ref 22–32)
Calcium: 8.8 mg/dL — ABNORMAL LOW (ref 8.9–10.3)
Chloride: 103 mmol/L (ref 98–111)
Creatinine, Ser: 0.93 mg/dL (ref 0.44–1.00)
GFR, Estimated: >60 mL/min (ref >60)
Glucose, Bld: 104 mg/dL — ABNORMAL HIGH (ref 70–99)
Potassium: 4.1 mmol/L (ref 3.5–5.1)
Sodium: 141 mmol/L (ref 135–145)
Total Bilirubin: 0.7 mg/dL (ref 0.3–1.2)
Total Protein: 7.6 g/dL (ref 6.5–8.1)

## 2021-07-28 LAB — CBC WITH DIFFERENTIAL/PLATELET
Abs Immature Granulocytes: 0.03 10*3/uL (ref 0.00–0.07)
Basophils Absolute: 0.1 10*3/uL (ref 0.0–0.1)
Basophils Relative: 1 %
Eosinophils Absolute: 0.2 10*3/uL (ref 0.0–0.5)
Eosinophils Relative: 2 %
HCT: 43.3 % (ref 36.0–46.0)
Hemoglobin: 13.6 g/dL (ref 12.0–15.0)
Immature Granulocytes: 0 %
Lymphocytes Relative: 22 %
Lymphs Abs: 2.4 10*3/uL (ref 0.7–4.0)
MCH: 28.4 pg (ref 26.0–34.0)
MCHC: 31.4 g/dL (ref 30.0–36.0)
MCV: 90.4 fL (ref 80.0–100.0)
Monocytes Absolute: 0.8 10*3/uL (ref 0.1–1.0)
Monocytes Relative: 7 %
Neutro Abs: 7.4 10*3/uL (ref 1.7–7.7)
Neutrophils Relative %: 68 %
Platelets: 378 10*3/uL (ref 150–400)
RBC: 4.79 MIL/uL (ref 3.87–5.11)
RDW: 15.1 % (ref 11.5–15.5)
WBC: 10.8 10*3/uL — ABNORMAL HIGH (ref 4.0–10.5)
nRBC: 0 % (ref 0.0–0.2)

## 2021-07-28 LAB — TROPONIN I (HIGH SENSITIVITY): Troponin I (High Sensitivity): 4 ng/L (ref <18)

## 2021-07-28 MED ORDER — METHOCARBAMOL 500 MG PO TABS
1000.0000 mg | ORAL_TABLET | Freq: Once | ORAL | Status: AC
  Administered 2021-07-28: 1000 mg via ORAL
  Filled 2021-07-28: qty 2

## 2021-07-28 MED ORDER — METHOCARBAMOL 500 MG PO TABS
500.0000 mg | ORAL_TABLET | Freq: Three times a day (TID) | ORAL | 0 refills | Status: AC | PRN
Start: 2021-07-28 — End: 2021-08-02

## 2021-07-28 NOTE — ED Provider Notes (Signed)
? ?[facility] ?Provider Note ? ?Patient Contact: 6:06 PM (approximate) ? ? ?History  ? ?Leg Pain ? ? ?HPI ? ?Cheryl Chandler is a 56 y.o. female with a history of chronic lymphedema, COPD and cellulitis, presents to the emergency department after an episode of near syncope.  Patient reported that she developed a severe cramp in her right leg near her right calf.  Patient states that during cramp, she became very short of breath and states that she felt as though she were going to pass out.  Patient is accompanied by her husband who states that patient remained conscious but was unable to answer questions from him or from paramedics.  He denies witnessing similar episodes in the past although patient states that she does experience shortness of breath with cramps.  Patient states that she sits chronically.  She denies a history of DVT or PE. ? ?  ? ? ?Physical Exam  ? ?Triage Vital Signs: ?ED Triage Vitals  ?Enc Vitals Group  ?   BP 07/28/21 1423 (!) 146/64  ?   Pulse Rate 07/28/21 1423 79  ?   Resp 07/28/21 1423 15  ?   Temp 07/28/21 1423 98 ?F (36.7 ?C)  ?   Temp Source 07/28/21 1423 Oral  ?   SpO2 07/28/21 1423 100 %  ?   Weight 07/28/21 1454 (!) 320 lb (145.2 kg)  ?   Height 07/28/21 1454 5\' 4"  (1.626 m)  ?   Head Circumference --   ?   Peak Flow --   ?   Pain Score 07/28/21 1453 8  ?   Pain Loc --   ?   Pain Edu? --   ?   Excl. in GC? --   ? ? ?Most recent vital signs: ?Vitals:  ? 07/28/21 1423 07/28/21 1811  ?BP: (!) 146/64 140/68  ?Pulse: 79 88  ?Resp: 15 16  ?Temp: 98 ?F (36.7 ?C)   ?SpO2: 100% 100%  ? ? ? ?General: Alert and in no acute distress. ?Eyes:  PERRL. EOMI. ?Head: No acute traumatic findings ?ENT: ?     Nose: No congestion/rhinnorhea. ?     Mouth/Throat: Mucous membranes are moist. ?Neck: No stridor. No cervical spine tenderness to palpation. ?Cardiovascular:  Good peripheral perfusion ?Respiratory: Normal respiratory effort without tachypnea or retractions. Lungs CTAB.  Good air entry to the bases with no decreased or absent breath sounds. ?Gastrointestinal: Bowel sounds ?4 quadrants. Soft and nontender to palpation. No guarding or rigidity. No palpable masses. No distention. No CVA tenderness. ?Musculoskeletal: Full range of motion to all extremities.  ?Neurologic:  No gross focal neurologic deficits are appreciated.  ?Skin: Patient has 3+ pitting edema bilaterally. ? ? ? ?ED Results / Procedures / Treatments  ? ?Labs ?(all labs ordered are listed, but only abnormal results are displayed) ?Labs Reviewed  ?CBC WITH DIFFERENTIAL/PLATELET - Abnormal; Notable for the following components:  ?    Result Value  ? WBC 10.8 (*)   ? All other components within normal limits  ?COMPREHENSIVE METABOLIC PANEL - Abnormal; Notable for the following components:  ? Glucose, Bld 104 (*)   ? Calcium 8.8 (*)   ? All other components within normal limits  ?TROPONIN I (HIGH SENSITIVITY)  ? ? ? ?EKG ?Normal sinus rhythm without ST segment elevation or other apparent arrhythmia. ? ? ? ?RADIOLOGY ? ?I personally viewed and evaluated these images as part of my medical decision making, as well as reviewing the written  report by the radiologist. ? ?ED Provider Interpretation: Personally reviewed venous ultrasound and agree with radiologist interpretation.  No signs of DVT. ? ? ?PROCEDURES: ? ?Critical Care performed: No ? ?Procedures ? ? ?MEDICATIONS ORDERED IN ED: ?Medications  ?methocarbamol (ROBAXIN) tablet 1,000 mg (1,000 mg Oral Given 07/28/21 2024)  ? ? ? ?IMPRESSION / MDM / ASSESSMENT AND PLAN / ED COURSE  ?I reviewed the triage vital signs and the nursing notes. ?             ?               ? ?Assessment and plan ?Muscle spasm ?Differential diagnosis includes, but is not limited to, DVT muscle spasm/cramp, electrolyte abnormality ? ?56 year old female presents to the emergency department with spasming of the right lower extremity pain ? ?Vital signs are reassuring at triage.  On physical exam, patient  was alert, active and nontoxic.  CBC and CMP were reassuring.  Troponin was within reference range.  Suspect muscle spasm at this time.  We will treat with Robaxin at home.  Return precautions were given to return with new or worsening symptoms. ? ?Clinical Course as of 07/28/21 2236  ?Mon Jul 28, 2021  ?1923 Alkaline Phosphatase: 95 [JW]  ?1923 HCT: 43.3 [JW]  ?  ?Clinical Course User Index ?[JW] Pia Mau M, PA-C  ? ? ? ?FINAL CLINICAL IMPRESSION(S) / ED DIAGNOSES  ? ?Final diagnoses:  ?Right leg pain  ? ? ? ?Rx / DC Orders  ? ?ED Discharge Orders   ? ?      Ordered  ?  methocarbamol (ROBAXIN) 500 MG tablet  Every 8 hours PRN       ? 07/28/21 1929  ? ?  ?  ? ?  ? ? ? ?Note:  This document was prepared using Dragon voice recognition software and may include unintentional dictation errors. ?  ?Orvil Feil, [place] ?07/28/21 2237 ? ?  ?Sharyn Creamer, MD ?07/28/21 2328 ? ?

## 2021-07-28 NOTE — Discharge Instructions (Signed)
You can take Robaxin up to 3 times daily for the next 5 days. 

## 2021-07-28 NOTE — ED Triage Notes (Signed)
Pt via EMS from home. Pt c/o R leg pain and spasm. Pt has a hx of neuropathy. Pt has a hx of anxiety and had an anxiety attack on the way here. Pt is now A&Ox4 and NAD.  ? ?128 CBG ?167/59  BP  ?99% on RA ?

## 2021-07-28 NOTE — ED Provider Notes (Signed)
ED ECG REPORT ?Raymond Gurney, the attending physician, personally viewed and interpreted this ECG. ? ?Date: 07/28/2021 ?EKG Time: 1830 ?Rate: 80 ?Rhythm: normal sinus rhythm ?QRS Axis: normal ?Intervals: normal ?ST/T Wave abnormalities: normal ?Narrative Interpretation: no evidence of acute ischemia ? ?  Sharyn Creamer, MD ?07/28/21 1840 ? ?

## 2021-07-29 ENCOUNTER — Other Ambulatory Visit: Payer: Self-pay | Admitting: Nurse Practitioner

## 2021-07-30 NOTE — Telephone Encounter (Signed)
Requested Prescriptions  ?Pending Prescriptions Disp Refills  ?? ACCU-CHEK GUIDE test strip [Pharmacy Med Name: ACCU-CHEK GUIDE STRIP] 300 each 2  ?  Sig: USE TO CHECK BLOOD SUGAR 3 TIMES DAILY AS DIRECTED  ?  ? Endocrinology: Diabetes - Testing Supplies Passed - 07/29/2021 10:39 AM  ?  ?  Passed - Valid encounter within last 12 months  ?  Recent Outpatient Visits   ?      ? 4 weeks ago Essential hypertension  ? [place], NP  ? 1 month ago Annual physical exam  ? [place], NP  ? 7 months ago Essential hypertension  ? [place], [place] P, DO  ? 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 [place], NP  ? 8 months ago Chronic venous insufficiency  ? [facility] [name], NP  ?  ?  ?Future Appointments   ?        ? In 1 month [name], NP [facility]  ?  ? ?  ?  ?  ?? SPIRIVA HANDIHALER 18 MCG inhalation capsule [Pharmacy Med Name: SPIRIVA HANDIHALER 18 MCG INH CAP] 90 capsule 2  ?  Sig: INHALE CONTENTS OF 1 CAPSULE ONCE DAILY  ?  ? Pulmonology:  Anticholinergic Agents Passed - 07/29/2021 10:39 AM  ?  ?  Passed - Valid encounter within last 12 months  ?  Recent Outpatient Visits   ?      ? 4 weeks ago Essential hypertension  ? [place], NP  ? 1 month ago Annual physical exam  ? [place], NP  ? 7 months ago Essential hypertension  ? [place], [place] P, DO  ? 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 [place]ings, NP  ? 8 months ago Chronic venous insufficiency  ? [facility] [name], NP  ?  ?  ?Future Appointments   ?         ? In 1 month [name], NP [facility], [facility]  ?  ? ?  ?  ?  ? ? ?

## 2021-08-04 ENCOUNTER — Ambulatory Visit (INDEPENDENT_AMBULATORY_CARE_PROVIDER_SITE_OTHER): Payer: Medicaid Other | Admitting: Vascular Surgery

## 2021-08-05 ENCOUNTER — Other Ambulatory Visit: Payer: Self-pay

## 2021-08-05 NOTE — Patient Outreach (Signed)
? ?   ? ?Chief Complaint  ?Patient presents with  ? High Risk Managed Medicaid  ? ? ?Cheryl Chandler is a 56 y.o. year old female who was referred for medication management by their primary care provider, [name], NP. They presented for a telephone visit in the context of the COVID-19 pandemic. ?  ?They were referred to the pharmacist by their High Risk Managed Medicaid Care Team  for assistance in managing hypertension.  ? ?Subjective: ? ?Care Team: ?Primary Care Provider: [name], NP ; Next Scheduled Visit: 09/03/21 ?Vascular - Dr. Delana Meyer - Next Scheduled Visit 09/01/21 ? ?Medication Access/Adherence ? ?Current Pharmacy:  ?[facility], [place] [address] ?[address] ?[place] [address] ?Phone: [phone] Fax: [phone] ? ? ?Patient reports affordability concerns with their medications: No  ?Patient reports access/transportation concerns to their pharmacy: No  ?Patient reports adherence concerns with their medications:  No   ? ? ?Hypertension: ? ?Current medications: lisinopril 40 mg daily, amlodipine 5 mg daily, hydralazine 25 mg three times daily  ? ?Current blood pressure readings readings: 140-180s/70s, unclear how long she has had this machine. Order for new BP machine was sent to [employer]. Called today to follow up, left voicemail. ? ? ?Health Maintenance ? ?Td/Tdap vaccination: due ?Pneumonia vaccination: up to date ?COVID vaccinations: due ?Shingrix vaccinations: due ?Colonoscopy: up to date - cologuard negative ?Mammogram: due - patient aware she needs to schedule ? ?Objective: ?Lab Results  ?Component Value Date  ? HGBA1C 5.9 (H) 06/04/2021  ? ? ?Lab Results  ?Component Value Date  ? CREATININE 0.93 07/28/2021  ? BUN 12 07/28/2021  ? NA 141 07/28/2021  ? K 4.1 07/28/2021  ? CL 103 07/28/2021  ? CO2 29 07/28/2021  ? ? ?Lab Results  ?Component Value Date  ? CHOL 114 06/04/2021  ? HDL 35 (L) 06/04/2021  ? Trainer 62 06/04/2021  ? TRIG 85  06/04/2021  ? CHOLHDL 3.3 06/04/2021  ? ? ?Medications Reviewed Today   ? ? Reviewed by Arlyce Harman, RN (Registered Nurse) on 07/28/21 at [place] List Status: <None>  ? ?Medication Order Taking? Sig Documenting Provider Last Dose Status Informant  ?Accu-Chek Softclix Lancets lancets [id number]  CHECK BLOOD SUGAR TWICE DAILY [name], NP  Active   ?albuterol (PROVENTIL) (2.5 MG/3ML) 0.083% nebulizer solution [id number]  INHALE CONTENTS OF 1 VIAL IN NEBULIZER EVERY 4 HOURS AS NEEDED [name], NP  Active   ?amLODipine (NORVASC) 5 MG tablet [id number]  TAKE 1 TABLET BY MOUTH ONCE DAILY [name], NP  Active   ?atorvastatin (LIPITOR) 80 MG tablet [id number]  Take 1 tablet (80 mg total) by mouth daily. [name], NP  Active   ?Blood Glucose Monitoring Suppl (ACCU-CHEK GUIDE) w/Device KIT [id number]  Use to check blood sugar 3 times a day. [name], NP  Active   ?Blood Pressure Monitoring (OMRON 3 SERIES BP MONITOR) DEVI [id number]  Use to check blood pressure as directed [name], NP  Active   ?citalopram (CELEXA) 40 MG tablet [id number]  Take 1 tablet (40 mg total) by mouth daily. [name], NP  Active   ?EPINEPHrine 0.3 mg/0.3 mL IJ SOAJ injection [id number]  USE AS DIRECTED. [name], NP  Active   ?furosemide (LASIX) 20 MG tablet [id number]  TAKE 1 TABLET BY MOUTH ONCE DAILY IN AFTERNOON AS NEEDED LEG EDEMA [name], NP  Active   ?gabapentin (NEURONTIN) 300 MG capsule [id number]  TAKE 1 CAPSULE BY MOUTH 3 TIMES DAILY ASNEEDED (NEED OFFICE VISIT FOR FURTHER REFILLS) [name], NP  Active   ?glucose blood (ACCU-CHEK GUIDE) test strip [id number]  USE TO CHECK BLOOD SUGAR 3 TIMES DAILY AS DIRECTED [name], NP  Active   ?hydrALAZINE (APRESOLINE) 25 MG tablet [id number]  Take 1 tablet (25 mg total) by mouth 3 (three) times daily. [name], NP  Active   ?levothyroxine (SYNTHROID) 50 MCG tablet [id number]  TAKE 1 TABLET BY MOUTH  ONCE DAILY ON AN EMPTY STOMACH. WAIT 30 MINUTES BEFORE TAKING OTHER MEDS. [name], NP  Active   ?lisinopril (ZESTRIL) 40 MG tablet [id number]  TAKE 1 TABLET BY MOUTH ONCE DAILY [name], NP  Active   ?meloxicam (MOBIC) 15 MG tablet [id number]  TAKE 1 TABLET BY MOUTH ONCE DAILY AS NEEDED WITH FOOD OR MILK [name], NP  Active   ?methocarbamol (ROBAXIN) 750 MG tablet [id number]  TAKE 1-2 TABLETS BY MOUTH 3 TIMES DAILY AS NEEDED  ?Patient not taking: Reported on 06/10/2021  ? [name], NP  Active   ?mometasone-formoterol (DULERA) 100-5 MCG/ACT AERO [id number]  INHALE 2 PUFFS BY MOUTH TWICE A DAY. RINSE MOUTH AFTER USE. [name], [place]  Active   ?montelukast (SINGULAIR) 10 MG tablet [id number]  TAKE 1 TABLET BY MOUTH AT BEDTIME [name], NP  Active   ?nitroGLYCERIN (NITROSTAT) 0.4 MG SL tablet [id number]  DISSOLVE 1 TABLET UNDER TONGUE AT ONSET OF CHEST PAIN. REPEAT IN 5 MIN IF NOT RESOLVED, MAX 3 DOSES. 911 IF NEEDED. [name], NP  Active   ?omeprazole (PRILOSEC) 20 MG capsule [id number]  Take 1 capsule (20 mg total) by mouth daily. [name], NP  Active   ?SPIRIVA HANDIHALER 18 MCG inhalation capsule [id number]  INHALE CONTENTS OF 1 CAPSULE ONCE DAILY [name], NP  Active   ?triamcinolone cream (KENALOG) 0.1 % [id number]  APPLY 1 APPLICATION TOPICALLY TWICE DAILY [name], [name], NP  Active   ?TRULICITY 3.53 IR/4.4RX SOPN [id number]  INJECT 0.75MG SUBQ ONCE A WEEK [name], NP  Active   ?Vitamin D, Ergocalciferol, (DRISDOL) 1.25 MG (50000 UNIT) CAPS capsule [id number]  TAKE 1 CAPSULE BY MOUTH EVERY 7 DAYS [name], NP  Active   ? ?  ?  ? ?  ? ? ?Assessment/Plan:  ?Care Plan : Medication Management  ?Updates made by Osker Mason, RPH-CPP since 08/05/2021 12:00 AM  ?  ? ?Problem: Hypertension   ?  ? ?Long-Range Goal: BP <130/80   ?Note:   ?Current Barriers:  ?Unable to achieve control of hypertension  ? ?Patient  Needs: ?Medication adherence support ? ?Patient Activities: ?Patient will:  ?- take medications as prescribed as evidenced by patient report and record review ?check blood pressure daily, document, and provide at future appointments ? ?  ? ? ?Hypertension: ?- Currently uncontrolled ?- Reviewed appropriate blood pressure monitoring technique and reviewed goal blood pressure. Recommended to check home blood pressure and heart rate daily ?- Recommend to continue current regimen ?- Will follow up with [employer] regarding order for BP machine ?- Moving forward, recommend initiation of chlorthalidone for antihypertensive effect ? ?Health Maintenance ?- Discussed CDC/ACIP recommendations for Covid, Tdap, Shingrix vaccine(s). Recommended to collaborate with her pharmacy to receive Covid. She declines to schedule a nurse visit for Shingrix or Tdap, but agrees to get one when she comes to see PCP in a few weeks. Reminder placed in appointment notes.  ?- Discussed screening recommendations for mammogram.  Patient will call and schedule.  ?- Patient reports she completed cologuard, was negative - will follow up with clinic staff to ensure this is documented to satisfy HM ? ? ?Follow Up Plan: phone call in 4 weeks ? ?Catie Hedwig Morton, PharmD, BCACP ?[facility] ?[phone] ? ? ? ?

## 2021-08-05 NOTE — Patient Instructions (Signed)
It was great to speak with you today! ? ?We recommend you get the updated bivalent COVID-19 booster, at least 2 months after any prior doses. You may consider delaying a booster dose by 3 months from a prior episode of COVID-19 per the CDC.  ? ?Plan to get either the Tdap or first Shingrix vaccine when you are in the office in May.  ? ?I'll call you when I find out more about your new blood pressure cuff.  ? ?Thanks! ? ?Catie Eppie Gibson, PharmD, BCACP ?[facility] ?[phone] ? ? ?Visit Information ? ?Cheryl Chandler was given information about Medicaid Managed Care team care coordination services as a part of their [facility] Medicaid benefit. Cheryl Chandler verbally consented to engagement with the [facility] Managed Care team.  ? ?If you are experiencing a medical emergency, please call 911 or report to your local emergency department or urgent care.  ? ?If you have a non-emergency medical problem during routine business hours, please contact your provider's office and ask to speak with a nurse.  ? ?For questions related to your [facility] health plan, please call: [phone] or go here:[email or web address] ? ?If you would like to schedule transportation through your [facility] plan, please call the following number at least 2 days in advance of your appointment: [phone]. ? You can also use the MTM portal or MTM mobile app to manage your rides. For the portal, please go to [email or web address] ? ?

## 2021-08-11 DIAGNOSIS — R3981 Functional urinary incontinence: Secondary | ICD-10-CM | POA: Diagnosis not present

## 2021-08-11 DIAGNOSIS — Z993 Dependence on wheelchair: Secondary | ICD-10-CM | POA: Diagnosis not present

## 2021-08-25 DIAGNOSIS — Z419 Encounter for procedure for purposes other than remedying health state, unspecified: Secondary | ICD-10-CM | POA: Diagnosis not present

## 2021-08-26 ENCOUNTER — Other Ambulatory Visit: Payer: Self-pay

## 2021-08-26 ENCOUNTER — Other Ambulatory Visit: Payer: Self-pay | Admitting: Obstetrics and Gynecology

## 2021-08-26 ENCOUNTER — Other Ambulatory Visit: Payer: Medicaid Other | Admitting: Licensed Clinical Social Worker

## 2021-08-26 DIAGNOSIS — I10 Essential (primary) hypertension: Secondary | ICD-10-CM

## 2021-08-26 NOTE — Patient Outreach (Signed)
?Medicaid Managed Care ?Social Work Note ? ?08/26/2021 ?Name:  Cheryl Chandler MRN:  [id number] DOB:  [birth year] ? ?Cheryl Chandler is an 56 y.o. year old female who is a primary patient of [name], NP.  The [facility] Managed Care Coordination team was consulted for assistance with:  Community Resources  ? ?Cheryl Chandler was given information about Medicaid Managed Care Coordination team services today. [name] agreed to services and verbal consent obtained. ? ?Engaged with patient  for by telephone forfollow up visit in response to referral for case management and/or care coordination services.  ? ?Assessments/Interventions:  Review of past medical history, allergies, medications, health status, including review of consultants reports, laboratory and other test data, was performed as part of comprehensive evaluation and provision of chronic care management services. ? ?SDOH: (Social Determinant of Health) assessments and interventions performed: ?BSW completed telephone outreach with patient, she stated since going to the hospital she has been feeling weak. Patient states other than that she is feeling okay. Patient stated she is going to get a new phone on Wednesday. Patient states her birthday is on Friday and her husband may take her to a [place]. No other resources are needed at this time ? ?Advanced Directives Status:  Not addressed in this encounter. ? ?Care Plan ?                ?Allergies  ?Allergen Reactions  ? Codeine Shortness Of Breath and Rash  ? Percocet [Oxycodone-Acetaminophen] Shortness Of Breath  ? Sulfa Antibiotics Shortness Of Breath and Rash  ? Aspirin Hives  ? Bee Venom   ?  Bee stings  ? Darvon [Propoxyphene]   ?  Darvocet-N 100  ? Latex   ? Oxycodone Nausea And Vomiting  ? Penicillins   ?  Has patient had a PCN reaction causing immediate rash, facial/tongue/throat swelling, SOB or lightheadedness with hypotension: Unknown ?Has patient had a  PCN reaction causing severe rash involving mucus membranes or skin necrosis: Unknown ?Has patient had a PCN reaction that required hospitalization: Unknown ?Has patient had a PCN reaction occurring within the last 10 years: Unknown ?If all of the above answers are "NO", then may proceed with Cephalosporin use.  ? ? ?Medications Reviewed Today   ? ? Reviewed by Arlyce Harman, RN (Registered Nurse) on 07/28/21 at [place] List Status: <None>  ? ?Medication Order Taking? Sig Documenting Provider Last Dose Status Informant  ?Accu-Chek Softclix Lancets lancets [id number]  CHECK BLOOD SUGAR TWICE DAILY [name], NP  Active   ?albuterol (PROVENTIL) (2.5 MG/3ML) 0.083% nebulizer solution [id number]  INHALE CONTENTS OF 1 VIAL IN NEBULIZER EVERY 4 HOURS AS NEEDED [name], NP  Active   ?amLODipine (NORVASC) 5 MG tablet [id number]  TAKE 1 TABLET BY MOUTH ONCE DAILY [name], NP  Active   ?atorvastatin (LIPITOR) 80 MG tablet [id number]  Take 1 tablet (80 mg total) by mouth daily. [name], NP  Active   ?Blood Glucose Monitoring Suppl (ACCU-CHEK GUIDE) w/Device KIT [id number]  Use to check blood sugar 3 times a day. [name], NP  Active   ?Blood Pressure Monitoring (OMRON 3 SERIES BP MONITOR) DEVI [id number]  Use to check blood pressure as directed [name], NP  Active   ?citalopram (CELEXA) 40 MG tablet [id number]  Take 1 tablet (40 mg total) by mouth daily. [name], NP  Active   ?EPINEPHrine 0.3 mg/0.3 mL IJ SOAJ injection [id number]  USE AS DIRECTED. Mathis Dad,  Santiago Glad, NP  Active   ?furosemide (LASIX) 20 MG tablet [id number]  TAKE 1 TABLET BY MOUTH ONCE DAILY IN AFTERNOON AS NEEDED LEG EDEMA [name], NP  Active   ?gabapentin (NEURONTIN) 300 MG capsule [id number]  TAKE 1 CAPSULE BY MOUTH 3 TIMES DAILY ASNEEDED (NEED OFFICE VISIT FOR FURTHER REFILLS) [name], NP  Active   ?glucose blood (ACCU-CHEK GUIDE) test strip [id number]  USE TO CHECK BLOOD SUGAR 3  TIMES DAILY AS DIRECTED [name], NP  Active   ?hydrALAZINE (APRESOLINE) 25 MG tablet [id number]  Take 1 tablet (25 mg total) by mouth 3 (three) times daily. [name], NP  Active   ?levothyroxine (SYNTHROID) 50 MCG tablet [id number]  TAKE 1 TABLET BY MOUTH ONCE DAILY ON AN EMPTY STOMACH. WAIT 30 MINUTES BEFORE TAKING OTHER MEDS. [name], NP  Active   ?lisinopril (ZESTRIL) 40 MG tablet [id number]  TAKE 1 TABLET BY MOUTH ONCE DAILY [name], NP  Active   ?meloxicam (MOBIC) 15 MG tablet [id number]  TAKE 1 TABLET BY MOUTH ONCE DAILY AS NEEDED WITH FOOD OR MILK [name], NP  Active   ?methocarbamol (ROBAXIN) 750 MG tablet [id number]  TAKE 1-2 TABLETS BY MOUTH 3 TIMES DAILY AS NEEDED  ?Patient not taking: Reported on 06/10/2021  ? [name], NP  Active   ?mometasone-formoterol (DULERA) 100-5 MCG/ACT AERO [id number]  INHALE 2 PUFFS BY MOUTH TWICE A DAY. RINSE MOUTH AFTER USE. [name], [place]  Active   ?montelukast (SINGULAIR) 10 MG tablet [id number]  TAKE 1 TABLET BY MOUTH AT BEDTIME [name], NP  Active   ?nitroGLYCERIN (NITROSTAT) 0.4 MG SL tablet [id number]  DISSOLVE 1 TABLET UNDER TONGUE AT ONSET OF CHEST PAIN. REPEAT IN 5 MIN IF NOT RESOLVED, MAX 3 DOSES. 911 IF NEEDED. [name], NP  Active   ?omeprazole (PRILOSEC) 20 MG capsule [id number]  Take 1 capsule (20 mg total) by mouth daily. [name], NP  Active   ?SPIRIVA HANDIHALER 18 MCG inhalation capsule [id number]  INHALE CONTENTS OF 1 CAPSULE ONCE DAILY [name], NP  Active   ?triamcinolone cream (KENALOG) 0.1 % [id number]  APPLY 1 APPLICATION TOPICALLY TWICE DAILY [name], [name], NP  Active   ?TRULICITY 3.43 HW/8.6HU SOPN [id number]  INJECT 0.75MG SUBQ ONCE A WEEK [name], NP  Active   ?Vitamin D, Ergocalciferol, (DRISDOL) 1.25 MG (50000 UNIT) CAPS capsule [id number]  TAKE 1 CAPSULE BY MOUTH EVERY 7 DAYS [name], NP  Active   ? ?  ?  ? ?  ? ? ?Patient  Active Problem List  ? Diagnosis Date Noted  ? Fibrocystic disease of both breasts 05/07/2019  ? IFG (impaired fasting glucose) 05/03/2019  ? Chronic venous insufficiency 11/09/2018  ? Overactive bladder 09/30/2018  ? Peripheral neuropathy 09/30/2018  ? Apnea 01/02/2016  ? Lymphedema 11/30/2014  ? Dyspnea on exertion 11/30/2014  ? Anxiety 11/29/2014  ? Arthritis 11/29/2014  ? COPD (chronic obstructive pulmonary disease) ([place]) 11/29/2014  ? Clinical depression 11/29/2014  ? H/O eating disorder 11/29/2014  ? Esophageal reflux 11/29/2014  ? Acne inversa 11/29/2014  ? Essential hypertension 11/29/2014  ? HLD (hyperlipidemia) 11/29/2014  ? Adult hypothyroidism 11/29/2014  ? Insomnia 11/29/2014  ? Low back pain 11/29/2014  ? Morbid obesity ([place]) 11/29/2014  ? Posttraumatic stress disorder 11/29/2014  ? Vitamin D deficiency 11/29/2014  ? Nicotine dependence, cigarettes, uncomplicated 33/61/2244  ? ? ?Conditions to be addressed/monitored per PCP order:   community resources ? ?Care Plan : General Social Work (  Adult)  ?Updates made by Ethelda Chick since 08/26/2021 12:00 AM  ?  ? ?Problem: Coping Skills (General Plan of Care)   ?  ? ?Long-Range Goal: Coping Skills Enhanced   ?Start Date: 11/07/2020  ?Expected End Date: 04/10/2021  ?Recent Progress: On track  ?Priority: High  ?Note:   ?Timeframe:  Long-Range Goal ?Priority:  High ?Start Date:       11/07/20                      ?Expected End Date:  04/10/21 ? ?Current Barriers:  ?Financial constraints ?Mental Health Concerns  ?Social Isolation ?Limited education about mental health support resources that are available to her within the local area* ?Cognitive Deficits ?Lacks knowledge of community resource ? ?Clinical Social Work Clinical Goal(s):  ?Over the next 90 days, client will work with SW to address concerns related to experiencing ongoing symptoms of depression, sadness and grief since the passing of her mother ?Over the next 90 days, patient will work with LCSW to  address needs related to implementing appropriate self-care and depression management.  ? ?Interventions: ?Patient interviewed and appropriate assessments performed ?Provided mental health counseling with re

## 2021-08-26 NOTE — Patient Instructions (Signed)
Visit Information ? ?Cheryl Chandler was given information about Medicaid Managed Care team care coordination services as a part of their [facility] Medicaid benefit. Cheryl Chandler verbally consented to engagement with the [facility] Managed Care team.  ? ?If you are experiencing a medical emergency, please call 911 or report to your local emergency department or urgent care.  ? ?If you have a non-emergency medical problem during routine business hours, please contact your provider's office and ask to speak with a nurse.  ? ?For questions related to your [facility] health plan, please call: [phone] or go here:[email or web address] ? ?If you would like to schedule transportation through your [facility] plan, please call the following number at least 2 days in advance of your appointment: [phone]. ? You can also use the MTM portal or MTM mobile app to manage your rides. For the portal, please go to [email or web address]. ? ?Call the [place] at [phone], at any time, 24 hours a day, 7 days a week. If you are in danger or need immediate medical attention call 911. ? ?If you would like help to quit smoking, call 1-800-QUIT-NOW 6096128001) OR Espa?ol: 1-855-D?jelo-Ya (320) [phone]) o para m?s informaci?n haga clic aqu? or Text READY to 200-400 to register via text ? ?Cheryl Chandler - following are the goals we discussed in your visit today:  ? Goals Addressed   ?None ?  ? ? ?Social Worker will follow up with patient in 30 days .  ? ?Mickel Fuchs, BSW, [place] ?[place]  ?High Risk Managed Medicaid Team  ?[phone]  ? ?Following is a copy of your plan of care:  ?Care Plan : General Social Work (Adult)  ?Updates made by Ethelda Chick since 08/26/2021 12:00 AM  ?  ? ?Problem: Coping Skills (General Plan of Care)   ?  ? ?Long-Range Goal: Coping Skills Enhanced   ?Start Date: 11/07/2020  ?Expected End Date: 04/10/2021  ?Recent  Progress: On track  ?Priority: High  ?Note:   ?Timeframe:  Long-Range Goal ?Priority:  High ?Start Date:       11/07/20                      ?Expected End Date:  04/10/21 ? ?Current Barriers:  ?Financial constraints ?Mental Health Concerns  ?Social Isolation ?Limited education about mental health support resources that are available to her within the local area* ?Cognitive Deficits ?Lacks knowledge of community resource ? ?Clinical Social Work Clinical Goal(s):  ?Over the next 90 days, client will work with SW to address concerns related to experiencing ongoing symptoms of depression, sadness and grief since the passing of her mother ?Over the next 90 days, patient will work with LCSW to address needs related to implementing appropriate self-care and depression management.  ? ?Interventions: ?Patient interviewed and appropriate assessments performed ?Provided mental health counseling with regards to patient's loss and ongoing stressors. Patient was very close with her mother who passed away a few years ago and she continues to experience symptoms of grief from this loss. Patient denies wanting LCSW to make a referral for grief counseling through [place] again but was receptive to coping skill and resource education/information that was provided during session today. Patient reports decorating for the holidays helps her to connect with her mother as they did this together when she was alive.  ?Provided patient with information about managing depression within her daily life. Patient has ongoing grief symptoms as well. Patient states "I  feel worn down." Patient reports that she talks out loud to her mother's picture and this is very helpful to her. Grief support resource education provided again during session today.  ?Provided reflective listening and implemented appropriate interventions to help suppport patient and her emotional needs  ?Discussed plans with patient for ongoing care management follow up and  provided patient with direct contact information for [facility] team ?Advised patient to contact [facility] team with any urgent case management needs. ?Assisted patient/caregiver with obtaining information about health plan benefits ?LCSW completed past referral for family to have a wheelchair ramp built at their residence due to ongoing mobility concerns. Ramp has been successfully installed.  ?PCS actively involved with patient and providing ongoing services.  ?Positive reinforcement provided to patient for quitting smoking cigarettes. Coping skill review education provided for future triggers.  ?Brief self-care education provided to both patient and spouse. ?Patient confirms that she continues to go to food pantry for food insecurity. Resource information has been provided.  ?Patient reports ongoing pain and issues with mobility. Patient shares that her pain resides in her pains and legs. Emotional support and coping skill education provided. ?Patient confirms that her PCS aide continues to provide her with services. However, aide has to be reminded to put on her mask since patient is high risk. ?LCSW reviewed resources that [name] provided her with in the past.  ?Patient reports that she had a wonderful birthday spent with her family this past May. Patient reports receiving appropriate socialization. Patient reports that her spouse took her out to eat and gifted her with several things which was a nice surprise.  ?Patient confirms stable transportation arrangements for upcoming PCP appointment this week.  ?Patient repots that she is losing a lot of weight but is not sure how much she has lost as she does not have a scale. Patient reports that a goal of hers is to lose weight but she has not been intentionally losing weight at this time.  ?Patient reports that her aide [name] continues to assist her with PCS and is an excellent support within the home for her and spouse.  ?Patient planted a tomato plant and successfully  grew over 5 tomatoes over the summer. Patient is very proud of this achievement as this was her first time planting a tomato plant and is now interested in picking up gardening/planting as a hobby. ?Patient reports that her aunt continues to be a positive support for her in her life and someone that she can rely on in the case of emergencies.  ?Patient was educated on healthy self-care skills and advised her to use these into her daily routine.  ?04/10/21- Update- Patient is agreeable to social work closure at this time as she still does not need medication management or counseling as she is managing her mental health well at this time. Patient talks to her younger sister everyday to gain emotional support. Patient says that her sister is into body building and helps to educate and remind her to do her own self-care. Patient is agreeable to contact [facility] LCSW directly if any social work concern arise. [facility] LCSW will complete case closure at this time.  ?Patient reports that she has been elevating her legs daily to elevate pain and swelling.  ?[facility] LCSW completed care coordination with Thomasene Lot on 01/15/21 and [facility] LCSW provided the following suggested resource-It is likely their best option given their loss of housing and lack of consistent income:  ?[employer] Coordinated Assessment                                                                                                                                                                              ?(  Emergency Crisis Housing) ?[phone] Ext. [phone] ?M-F, 8am - 4pm ?Patient was successfully approved for food stamps but is still experiencing financial concerns. She reports that she and her husband have no clothes that fit them and they are having to start using a string for their pants and patient only has one bra at this time. [facility] LCSW will send referral to [facility] BSW today for financial support.  ?BSW spoke with patient about resources needed. Patient states  she does receive SSI and was approved for foodstamps. Patient is familiar with all of the food pantries and other resources in [place]. BSW will assist patient with finding an eye doctor. Patient states she h

## 2021-08-26 NOTE — Patient Instructions (Signed)
Visit Information ? ?Cheryl Chandler was given information about Medicaid Managed Care team care coordination services as a part of their [facility] Medicaid benefit. Cheryl Chandler verbally consented to engagement with the [facility] Managed Care team.  ? ?If you are experiencing a medical emergency, please call 911 or report to your local emergency department or urgent care.  ? ?If you have a non-emergency medical problem during routine business hours, please contact your provider's office and ask to speak with a nurse.  ? ?For questions related to your [facility] health plan, please call: [phone] or go here:[email or web address] ? ?If you would like to schedule transportation through your [facility] plan, please call the following number at least 2 days in advance of your appointment: [phone]. ? You can also use the MTM portal or MTM mobile app to manage your rides. For the portal, please go to [email or web address]. ? ?Call the [place] at [phone], at any time, 24 hours a day, 7 days a week. If you are in danger or need immediate medical attention call 911. ? ?If you would like help to quit smoking, call 1-800-QUIT-NOW 732-155-9383) OR Espa?ol: 1-855-D?jelo-Ya 210-256-2700) o para m?s informaci?n haga clic aqu? or Text READY to 200-400 to register via text ? ?Following is a copy of your plan of care:  ?Care Plan : General Social Work (Adult)  ?Updates made by [name], LCSW since 08/26/2021 12:00 AM  ?  ? ?Problem: Coping Skills (General Plan of Care)   ?  ? ?Long-Range Goal: Coping Skills Enhanced   ?Start Date: 11/07/2020  ?Expected End Date: 04/10/2021  ?Recent Progress: On track  ?Priority: High  ?Note:   ?Timeframe:  Long-Range Goal ?Priority:  High ?Start Date:       08/26/21                    ?Expected End Date:  ongoing ? ?Follow up date: 10/13/21 ? ?Current Barriers:  ?Financial constraints ?Mental Health Concerns  ?Social Isolation ?Limited  education about mental health support resources that are available to her within the local area* ?Cognitive Deficits ?Lacks knowledge of community resource ? ?Clinical Social Work Clinical Goal(s):  ?Over the next 90 days, client will work with SW to address concerns related to experiencing ongoing symptoms of depression, sadness and grief since the passing of her mother ?Over the next 90 days, patient will work with LCSW to address needs related to implementing appropriate self-care and depression management.  ? ?Managing Loss, Adult ?People experience loss in many different ways throughout their lives. Events such as moving, changing jobs, and losing friends can create a sense of loss. The loss may be as serious as a major health change, divorce, death of a pet, or death of a loved one. All of these types of loss are likely to create a physical and emotional reaction known as grief. Grief is the result of a major change or an absence of something or someone that you count on. Grief is a normal reaction to loss. ?A variety of factors can affect your grieving experience, including: ?The nature of your loss. ?Your relationship to what or whom you lost. ?Your understanding of grief and how to manage it. ?Your support system. ?How to manage lifestyle changes ?Keep to your normal routine as much as possible. ?If you have trouble focusing or doing normal activities, it is acceptable to take some time away from your normal routine. ?Spend time with friends and loved ones. ?  Eat a healthy diet, get plenty of sleep, and rest when you feel tired. ?How to recognize changes  ?The way that you deal with your grief will affect your ability to function as you normally do. When grieving, you may experience these changes: ?Numbness, shock, sadness, anxiety, anger, denial, and guilt. ?Thoughts about death. ?Unexpected crying. ?A physical sensation of emptiness in your stomach. ?Problems sleeping and eating. ?Tiredness  (fatigue). ?Loss of interest in normal activities. ?Dreaming about or imagining seeing the person who died. ?A need to remember what or whom you lost. ?Difficulty thinking about anything other than your loss for a period of time. ?Relief. If you have been expecting the loss for a while, you may feel a sense of relief when it happens. ?Follow these instructions at home: ? ?  ?Activity ?Express your feelings in healthy ways, such as: ?Talking with others about your loss. It may be helpful to find others who have had a similar loss, such as a support group. ?Writing down your feelings in a journal. ?Doing physical activities to release stress and emotional energy. ?Doing creative activities like painting, sculpting, or playing or listening to music. ?Practicing resilience. This is the ability to recover and adjust after facing challenges. Reading some resources that encourage resilience may help you to learn ways to practice those behaviors. ?General instructions ?Be patient with yourself and others. Allow the grieving process to happen, and remember that grieving takes time. ?It is likely that you may never feel completely done with some grief. You may find a way to move on while still cherishing memories and feelings about your loss. ?Accepting your loss is a process. It can take months or longer to adjust. ?Keep all follow-up visits as told by your health care provider. This is important. ?Where to find support ?To get support for managing loss: ?Ask your health care provider for help and recommendations, such as grief counseling or therapy. ?Think about joining a support group for people who are managing a loss. ?Where to find more information ?You can find more information about managing loss from: ?American Society of Clinical Oncology: www.cancer.net ?American Psychological Association: TVStereos.ch ?Contact a health care provider if: ?Your grief is extreme and keeps getting worse. ?You have ongoing grief that  does not improve. ?Your body shows symptoms of grief, such as illness. ?You feel depressed, anxious, or lonely. ?Get help right away if: ?You have thoughts about hurting yourself or others. ?If you ever feel like you may hurt yourself or others, or have thoughts about taking your own life, get help right away. You can go to your nearest emergency department or call: ?Your local emergency services (911 in the [place] ?A suicide crisis helpline, such as the [place] at 7478111108. This is open 24 hours a day. ?Summary ?Grief is the result of a major change or an absence of someone or something that you count on. Grief is a normal reaction to loss. ?The depth of grief and the period of recovery depend on the type of loss and your ability to adjust to the change and process your feelings. ?Processing grief requires patience and a willingness to accept your feelings and talk about your loss with people who are supportive. ?It is important to find resources that work for you and to realize that people experience grief differently. There is not one grieving process that works for everyone in the same way. ?Be aware that when grief becomes extreme, it can lead to more severe  issues like isolation, depression, anxiety, or suicidal thoughts. Talk with your health care provider if you have any of these issues. ?This information is not intended to replace advice given to you by your health care provider. Make sure you discuss any questions you have with your health care provider. ?Document Revised: 06/17/2018 Document Reviewed: 08/27/2016 ?Elsevier Patient Education ? [place]. ? ?Help for Managing Grief ?  ?When you are experiencing grief, many resources and support are available to help you. You are not alone.  ?  ?Grief Share (https://www.https://jacobson-moore.net/):  ?  ?[place] ?[place], [place]  ?  ?  ?[facility]  ?[place]. [place],  [place] ?  ?  ?[place] ?[place], [place]  ?  ?  ?[place] ?[place], [place]  ?[place] ?  ?[facility] ?[address] ?[place], [place] ?[id number]

## 2021-08-26 NOTE — Patient Instructions (Signed)
Visit Information ? ?Ms. Cancelliere was given information about Medicaid Managed Care team care coordination services as a part of their [facility] Medicaid benefit. Tarri Shanna Un verbally consented to engagement with the [facility] Managed Care team.  ? ?If you are experiencing a medical emergency, please call 911 or report to your local emergency department or urgent care.  ? ?If you have a non-emergency medical problem during routine business hours, please contact your provider's office and ask to speak with a nurse.  ? ?For questions related to your [facility] health plan, please call: [phone] or go here:[email or web address] ? ?If you would like to schedule transportation through your [facility] plan, please call the following number at least 2 days in advance of your appointment: [phone]. ? You can also use the MTM portal or MTM mobile app to manage your rides. For the portal, please go to [email or web address] ? ?Call the Behavioral Health Crisis Line at [phone], at any time, 24 hours a day, 7 days a week. If you are in danger or need immediate medical attention call 911. ? ?If you would like help to quit smoking, call 1-800-QUIT-NOW (858 573 0408) OR Espa?ol: 1-855-D?jelo-Ya 747-678-9120) o para m?s informaci?n haga clic aqu? or Text READY to 200-400 to register via text ? ?Cheryl Chandler - following are the goals we discussed in your visit today:  ? Goals Addressed   ? ?  ?  ?  ?  ? This Visit's Progress  ?  RNCM: Keep Skin Clean and Dry     ?  Follow Up Date: 09/26/21 ? ?- clean and dry skin well ?- keep skin dry ?- use a fragrance-free lotion on skin ?- wear a protective pad or garment  ?  ?Why is this important?   ?Leaking urine (pee) can cause soreness from skin rashes and redness.  ?This is caused by skin being exposed to urine (pee).  ?  ?08/26/21:  no complaints other than new skin eruptions-"sores"-will speak to PCP about it at appt 5/10 ?  ?  RNCM: Track and  Manage My Symptoms-Depression     ?  Timeframe:  Long-Range Goal ?Priority:  High ?Start Date:    12-02-2020                         ?Expected End Date:   ongoing               ? ?Follow Up Date: 09/26/21 ?  ?- avoid negative self-talk ?- develop a personal safety plan ?- develop a plan to deal with triggers like holidays, anniversaries ?- exercise at least 2 to 3 times per week ?- have a plan for how to handle bad days ?- journal feelings and what helps to feel better or worse ?- spend time or talk with others at least 2 to 3 times per week ?- spend time or talk with others every day ?- watch for early signs of feeling worse  ?  ?Why is this important?   ?Keeping track of your progress will help your treatment team find the right mix of medicine and therapy for you.  ?Write in your journal every day.  ?Day-to-day changes in depression symptoms are normal. It may be more helpful to check your progress at the end of each week instead of every day.   ? 09/26/21:  complaining of increased stress-LCSW appt made ?  ?  RNCM: Track and Manage My Triggers-COPD     ?  Timeframe:  Long-Range Goal ?Priority:  Medium ?Start Date:     05-22-2020                        ?Expected End Date:   ongoing         ? ?Follow Up Date: 09/26/21 ?  ?- avoid second hand smoke ?- eliminate smoking in my home ?- identify and avoid work-related triggers ?- identify and remove indoor air pollutants ?- limit outdoor activity during cold weather ?- listen for public air quality announcements every day  ?  ?Why is this important?   ?Triggers are activities or things, like tobacco smoke or cold weather, that make your COPD (chronic obstructive pulmonary disease) flare-up.  ?Knowing these triggers helps you plan how to stay away from them.  ?When you cannot remove them, you can learn how to manage them.   ? ? 08/26/21:  increased SOB-using 2 pillows, Oxygen saturations WNL per patient.  Has appt with PCP 09/03/21  ? ?The patient verbalized understanding of  instructions, educational materials, and care plan provided today and agreed to receive a mailed copy of patient instructions, educational materials, and care plan.  ? ?The Managed Medicaid care management team will reach out to the patient again over the next 30 days.  ?The  Patient has been provided with contact information for the Managed Medicaid care management team and has been advised to call with any health related questions or concerns.  ? ?Kathi Dererri Takota Cahalan RN, BSN ?[place]  [facility] ?Care Management Coordinator - Managed Medicaid High Risk ?[phone] ?  ?Following is a copy of your plan of care:  ?Care Plan : RNCM: COPD (Adult)  ?Updates made by Danie Chandlerraft, Alford Gamero G, RN since 08/26/2021 12:00 AM  ?  ? ?Problem: RNCM: Psychological Adjustment to Diagnosis (COPD)   ?Priority: Medium  ?Onset Date: 05/22/2020  ?  ? ?Long-Range Goal: RNCM: Adjustment to Disease Achieved   ?Start Date: 05/22/2020  ?Expected End Date: 09/20/2021  ?Recent Progress: On track  ?Priority: High  ?Note:   ?Current Barriers:  ?Knowledge deficits related to basic understanding of COPD disease process ?Knowledge deficits related to basic COPD self care/management ?Knowledge deficit related to basic understanding of how to use inhalers and how inhaled medications work ?Knowledge deficit related to importance of energy conservation ?Limited Social Support ?Unable to independently manage COPD ?Lacks social connections ?Does not contact provider office for questions/concerns  ?08/26/21: Patient states she has been having increased SOB, using 2 pillows at night, oxygen saturations WNL per patient-patient to see PCP ?Case Manager Clinical Goal(s): ?patient will report using inhalers as prescribed including rinsing mouth after use ?patient will be able to verbalize understanding of COPD action plan and when to seek appropriate levels of medical care ? patient will engage in lite exercise as tolerated to build/regain stamina and strength  and reduce shortness of breath through activity tolerance ? patient will verbalize basic understanding of COPD disease process and self care activities  ?Interventions:  ?Collaboration with Larae GroomsHoldsworth, Karen, NP regarding development and update of comprehensive plan of care as evidenced by provider attestation and co-signature ?Inter-disciplinary care team collaboration (see longitudinal plan of care) ?UNABLE to independently: manage COPD ?Provided patient with basic written and verbal COPD education on self care/management/and exacerbation prevention ?Provided patient with COPD action plan and reinforced importance of daily self assessment.  ?Provided written and verbal instructions on pursed lip breathing and utilized returned demonstration as teach back ?Provided instruction about  proper use of medications used for management of COPD including inhalers ?Advised patient to self assesses COPD action plan zone and make appointment with provider if in the yellow zone for 48 hours without improvement. ?Provided patient with education about the role of exercise in the management of COPD ?Advised patient to engage in light exercise as tolerated 3-5 days a week ?Provided education about and advised patient to utilize infection prevention strategies to reduce risk of respiratory infection. ?Patient Goals/Self-Care Activities:  ?- decision-making supported ?- depression screen reviewed ?- emotional support provided ?- family involvement promoted ?- problem-solving facilitated ?- relaxation techniques promoted ?- verbalization of feelings encouraged ?Follow Up Plan: Telephone follow up appointment with care management team member scheduled.  ? ?Problem: RNCM: Symptom Exacerbation (COPD)   ?Priority: High  ?Onset Date: 05/22/2020  ?  ? ?Long-Range Goal: RNCM: Smoking Cessation Symptom Exacerbation Prevented or Minimized   ?Start Date: 05/22/2020  ?Expected End Date: 12/21/2021  ?Recent Progress: Not on track  ?Priority: High   ?Note:   ?Current Barriers:  ?Unable to independently stop smoking  ?Does not adhere to provider recommendations re: smoking cessation  ?Lacks social connections ?Does not contact provider office for questions/conce

## 2021-08-26 NOTE — Patient Outreach (Signed)
?Medicaid Managed Care ?Social Work Note ? ?08/26/2021 ?Name:  Cheryl Chandler MRN:  [id number] DOB:  12/02/1965 ? ?Cheryl Chandler is an 56 y.o. year old female who is a primary patient of [name], NP.  The [facility] Managed Care Coordination team was consulted for assistance with:  Mental Health Counseling and Resources ?Grief Counseling ? ?Ms. Hege was given information about Medicaid Managed Care Coordination team services today. [place] Patient agreed to services and verbal consent obtained. ? ?Engaged with patient  for by telephone forinitial visit in response to referral for case management and/or care coordination services.  ? ?Assessments/Interventions:  Review of past medical history, allergies, medications, health status, including review of consultants reports, laboratory and other test data, was performed as part of comprehensive evaluation and provision of chronic care management services. ? ?SDOH: (Social Determinant of Health) assessments and interventions performed: ?[name]   ? ?Flowsheet Row Most Recent Value  ?[name]   ?Stress Interventions Offered [employer], Provide Counseling  ?Depression Interventions/Treatment  Medication, Currently on Treatment  ? ?  ? ? ?Advanced Directives Status:  Not addressed in this encounter. ? ?Care Plan ?                ?Allergies  ?Allergen Reactions  ? Codeine Shortness Of Breath and Rash  ? Percocet [Oxycodone-Acetaminophen] Shortness Of Breath  ? Sulfa Antibiotics Shortness Of Breath and Rash  ? Aspirin Hives  ? Bee Venom   ?  Bee stings  ? Darvon [Propoxyphene]   ?  Darvocet-N 100  ? Latex   ? Oxycodone Nausea And Vomiting  ? Penicillins   ?  Has patient had a PCN reaction causing immediate rash, facial/tongue/throat swelling, SOB or lightheadedness with hypotension: Unknown ?Has patient had a PCN reaction causing severe rash involving mucus membranes or skin necrosis: Unknown ?Has  patient had a PCN reaction that required hospitalization: Unknown ?Has patient had a PCN reaction occurring within the last 10 years: Unknown ?If all of the above answers are "NO", then may proceed with Cephalosporin use.  ? ? ?Medications Reviewed Today   ? ? Reviewed by [name], RN (Registered Nurse) on 08/26/21 at 1045  Med List Status: <None>  ? ?Medication Order Taking? Sig Documenting Provider Last Dose Status Informant  ?ACCU-CHEK GUIDE test strip [id number]  USE TO CHECK BLOOD SUGAR 3 TIMES DAILY AS DIRECTED [name], NP  Active   ?Accu-Chek Softclix Lancets lancets [id number] No CHECK BLOOD SUGAR TWICE DAILY [name], NP Taking Active   ?albuterol (PROVENTIL) (2.5 MG/3ML) 0.083% nebulizer solution [id number] No INHALE CONTENTS OF 1 VIAL IN NEBULIZER EVERY 4 HOURS AS NEEDED [name], NP Taking Active   ?amLODipine (NORVASC) 5 MG tablet [id number] No TAKE 1 TABLET BY MOUTH ONCE DAILY [name], NP Taking Active   ?atorvastatin (LIPITOR) 80 MG tablet [id number] No Take 1 tablet (80 mg total) by mouth daily. [name], NP Taking Active   ?Blood Glucose Monitoring Suppl (ACCU-CHEK GUIDE) w/Device KIT [id number] No Use to check blood sugar 3 times a day. [name], NP Taking Active   ?Blood Pressure Monitoring (OMRON 3 SERIES BP MONITOR) DEVI [id number]  Use to check blood pressure as directed [name], NP  Active   ?citalopram (CELEXA) 40 MG tablet [id number] No Take 1 tablet (40 mg total) by mouth daily. [name], NP Taking Active   ?EPINEPHrine 0.3 mg/0.3 mL IJ SOAJ injection [id number] No USE AS DIRECTED. [name], NP  Taking Active   ?furosemide (LASIX) 20 MG tablet [id number] No TAKE 1 TABLET BY MOUTH ONCE DAILY IN AFTERNOON AS NEEDED LEG EDEMA [name], NP Taking Active   ?gabapentin (NEURONTIN) 300 MG capsule [id number] No TAKE 1 CAPSULE BY MOUTH 3 TIMES DAILY ASNEEDED (NEED OFFICE VISIT FOR FURTHER REFILLS) [name], NP Taking Active   ?hydrALAZINE (APRESOLINE) 25 MG tablet [id number] No Take 1 tablet (25 mg total) by mouth 3 (three) times daily. [name], NP Taking Active   ?levothyroxine (SYNTHROID) 50 MCG tablet [id number] No TAKE 1 TABLET BY MOUTH ONCE DAILY ON AN EMPTY STOMACH. WAIT 30 MINUTES BEFORE TAKING OTHER MEDS. [name], NP Taking Active   ?lisinopril (ZESTRIL) 40 MG tablet [id number] No TAKE 1 TABLET BY MOUTH ONCE DAILY [name], NP Taking Active   ?meloxicam (MOBIC) 15 MG tablet [id number] No TAKE 1 TABLET BY MOUTH ONCE DAILY AS NEEDED WITH FOOD OR MILK [name], NP Taking Active   ?mometasone-formoterol (DULERA) 100-5 MCG/ACT AERO [id number] No INHALE 2 PUFFS BY MOUTH TWICE A DAY. RINSE MOUTH AFTER USE. [name], [place] Taking Active   ?montelukast (SINGULAIR) 10 MG tablet [id number] No TAKE 1 TABLET BY MOUTH AT BEDTIME [name], NP Taking Active   ?nitroGLYCERIN (NITROSTAT) 0.4 MG SL tablet [id number] No DISSOLVE 1 TABLET UNDER TONGUE AT ONSET OF CHEST PAIN. REPEAT IN 5 MIN IF NOT RESOLVED, MAX 3 DOSES. 911 IF NEEDED. [name], NP Taking Active   ?omeprazole (PRILOSEC) 20 MG capsule [id number] No Take 1 capsule (20 mg total) by mouth daily. [name], NP Taking Active   ?SPIRIVA HANDIHALER 18 MCG inhalation capsule [id number]  INHALE CONTENTS OF 1 CAPSULE ONCE DAILY [name], NP  Active   ?triamcinolone cream (KENALOG) 0.1 % [id number] No APPLY 1 APPLICATION TOPICALLY TWICE DAILY [name], [name], NP Taking Active   ?TRULICITY 7.59 FM/3.8GY SOPN [id number] No INJECT 0.75MG SUBQ ONCE A WEEK [name], NP Taking Active   ?Vitamin D, Ergocalciferol, (DRISDOL) 1.25 MG (50000 UNIT) CAPS capsule [id number] No TAKE 1 CAPSULE BY MOUTH EVERY 7 DAYS [name], NP Taking Active   ? ?  ?  ? ?  ? ? ?Patient Active Problem List  ? Diagnosis Date Noted  ? Fibrocystic disease of both breasts 05/07/2019  ? IFG (impaired fasting  glucose) 05/03/2019  ? Chronic venous insufficiency 11/09/2018  ? Overactive bladder 09/30/2018  ? Peripheral neuropathy 09/30/2018  ? Apnea 01/02/2016  ? Lymphedema 11/30/2014  ? Dyspnea on exertion 11/30/2014  ? Anxiety 11/29/2014  ? Arthritis 11/29/2014  ? COPD (chronic obstructive pulmonary disease) ([place]) 11/29/2014  ? Clinical depression 11/29/2014  ? H/O eating disorder 11/29/2014  ? Esophageal reflux 11/29/2014  ? Acne inversa 11/29/2014  ? Essential hypertension 11/29/2014  ? HLD (hyperlipidemia) 11/29/2014  ? Adult hypothyroidism 11/29/2014  ? Insomnia 11/29/2014  ? Low back pain 11/29/2014  ? Morbid obesity ([place]) 11/29/2014  ? Posttraumatic stress disorder 11/29/2014  ? Vitamin D deficiency 11/29/2014  ? Nicotine dependence, cigarettes, uncomplicated 92/33/0076  ? ? ?Conditions to be addressed/monitored per PCP order:  Anxiety and Depression ? ?Care Plan : General Social Work (Adult)  ?Updates made by [name], LCSW since 08/26/2021 12:00 AM  ?  ? ?Problem: Coping Skills (General Plan of Care)   ?  ? ?Long-Range Goal: Coping Skills Enhanced   ?Start Date: 11/07/2020  ?Expected End Date: 04/10/2021  ?Recent Progress: On track  ?Priority: High  ?Note:   ?Timeframe:  Long-Range Goal ?Priority:  High ?Start Date:       08/26/21                    ?Expected End Date:  ongoing ? ?Follow up date: 10/13/21 ? ?Current Barriers:  ?Financial constraints ?Mental Health Concerns  ?Social Isolation ?Limited education about mental health support resources that are available to her within the local area* ?Cognitive Deficits ?Lacks knowledge of community resource ? ?Clinical Social Work Clinical Goal(s):  ?Over the next 90 days, client will work with SW to address concerns related to experiencing ongoing symptoms of depression, sadness and grief since the passing of her mother ?Over the next 90 days, patient will work with LCSW to address needs related to implementing appropriate self-care and depression management.   ? ?Interventions: ?Patient interviewed and appropriate assessments performed ?Provided mental health counseling with regards to patient's loss and ongoing stressors. Patient was very close with her mother who passed a

## 2021-08-26 NOTE — Patient Outreach (Signed)
?Medicaid Managed Care   ?Nurse Care Manager Note ? ?08/26/2021 ?Name:  Cheryl Chandler MRN:  [id number] DOB:  Jul 19, 1965 ? ?Cheryl Chandler is an 56 y.o. year old female who is a primary patient of Cheryl Billings, NP.  The [facility] Managed Care Coordination team was consulted for assistance with:    ?Chronic healthcare management needs, HTN, COPD, GERD, arthritis, anxiety/depression/PTSD, LBP, lymphedema, HLD, hypothyroid ? ?[name] was given information about Medicaid Managed Care Coordination team services today. [place] Patient agreed to services and verbal consent obtained. ? ?Engaged with patient by telephone for follow up visit in response to provider referral for case management and/or care coordination services.  ? ?Assessments/Interventions:  Review of past medical history, allergies, medications, health status, including review of consultants reports, laboratory and other test data, was performed as part of comprehensive evaluation and provision of chronic care management services. ? ?SDOH (Social Determinants of Health) assessments and interventions performed: ?[name]   ? ?Flowsheet Row Most Recent Value  ?[name]   ?Stress Interventions Other (Comment)  [referral to SW]  ?Transportation Interventions Other (Comment)  [father in law takes them]  ? ?  ? ?Care Plan ? ?Allergies  ?Allergen Reactions  ? Codeine Shortness Of Breath and Rash  ? Percocet [Oxycodone-Acetaminophen] Shortness Of Breath  ? Sulfa Antibiotics Shortness Of Breath and Rash  ? Aspirin Hives  ? Bee Venom   ?  Bee stings  ? Darvon [Propoxyphene]   ?  Darvocet-N 100  ? Latex   ? Oxycodone Nausea And Vomiting  ? Penicillins   ?  Has patient had a PCN reaction causing immediate rash, facial/tongue/throat swelling, SOB or lightheadedness with hypotension: Unknown ?Has patient had a PCN reaction causing severe rash involving mucus membranes or skin necrosis: Unknown ?Has patient had a PCN  reaction that required hospitalization: Unknown ?Has patient had a PCN reaction occurring within the last 10 years: Unknown ?If all of the above answers are "NO", then may proceed with Cephalosporin use.  ? ?Medications Reviewed Today   ? ? Reviewed by [name], RN (Registered Nurse) on 08/26/21 at 1045  Med List Status: <None>  ? ?Medication Order Taking? Sig Documenting Provider Last Dose Status Informant  ?ACCU-CHEK GUIDE test strip [id number]  USE TO CHECK BLOOD SUGAR 3 TIMES DAILY AS DIRECTED Cheryl Billings, NP  Active   ?Accu-Chek Softclix Lancets lancets [id number] No CHECK BLOOD SUGAR TWICE DAILY Cheryl Billings, NP Taking Active   ?albuterol (PROVENTIL) (2.5 MG/3ML) 0.083% nebulizer solution [id number] No INHALE CONTENTS OF 1 VIAL IN NEBULIZER EVERY 4 HOURS AS NEEDED Cheryl Billings, NP Taking Active   ?amLODipine (NORVASC) 5 MG tablet [id number] No TAKE 1 TABLET BY MOUTH ONCE DAILY Cheryl Billings, NP Taking Active   ?atorvastatin (LIPITOR) 80 MG tablet [id number] No Take 1 tablet (80 mg total) by mouth daily. Cheryl Billings, NP Taking Active   ?Blood Glucose Monitoring Suppl (ACCU-CHEK GUIDE) w/Device KIT [id number] No Use to check blood sugar 3 times a day. [name], NP Taking Active   ?Blood Pressure Monitoring (OMRON 3 SERIES BP MONITOR) DEVI [id number]  Use to check blood pressure as directed Cheryl Billings, NP  Active   ?citalopram (CELEXA) 40 MG tablet [id number] No Take 1 tablet (40 mg total) by mouth daily. Cheryl Billings, NP Taking Active   ?EPINEPHrine 0.3 mg/0.3 mL IJ SOAJ injection [id number] No USE AS DIRECTED. Cheryl Billings, NP Taking Active   ?furosemide (LASIX) 20 MG tablet [id number] No TAKE  1 TABLET BY MOUTH ONCE DAILY IN AFTERNOON AS NEEDED LEG EDEMA Cheryl Billings, NP Taking Active   ?gabapentin (NEURONTIN) 300 MG capsule [id number] No TAKE 1 CAPSULE BY MOUTH 3 TIMES DAILY ASNEEDED (NEED OFFICE VISIT FOR FURTHER REFILLS) Cheryl Billings, NP Taking Active    ?hydrALAZINE (APRESOLINE) 25 MG tablet [id number] No Take 1 tablet (25 mg total) by mouth 3 (three) times daily. Cheryl Billings, NP Taking Active   ?levothyroxine (SYNTHROID) 50 MCG tablet [id number] No TAKE 1 TABLET BY MOUTH ONCE DAILY ON AN EMPTY STOMACH. WAIT 30 MINUTES BEFORE TAKING OTHER MEDS. Cheryl Billings, NP Taking Active   ?lisinopril (ZESTRIL) 40 MG tablet [id number] No TAKE 1 TABLET BY MOUTH ONCE DAILY Cheryl Billings, NP Taking Active   ?meloxicam (MOBIC) 15 MG tablet [id number] No TAKE 1 TABLET BY MOUTH ONCE DAILY AS NEEDED WITH FOOD OR MILK Cheryl Billings, NP Taking Active   ?mometasone-formoterol (DULERA) 100-5 MCG/ACT AERO [id number] No INHALE 2 PUFFS BY MOUTH TWICE A DAY. RINSE MOUTH AFTER USE. [name], [place] Taking Active   ?montelukast (SINGULAIR) 10 MG tablet [id number] No TAKE 1 TABLET BY MOUTH AT BEDTIME Cheryl Billings, NP Taking Active   ?nitroGLYCERIN (NITROSTAT) 0.4 MG SL tablet [id number] No DISSOLVE 1 TABLET UNDER TONGUE AT ONSET OF CHEST PAIN. REPEAT IN 5 MIN IF NOT RESOLVED, MAX 3 DOSES. 911 IF NEEDED. Cheryl Billings, NP Taking Active   ?omeprazole (PRILOSEC) 20 MG capsule [id number] No Take 1 capsule (20 mg total) by mouth daily. Cheryl Billings, NP Taking Active   ?SPIRIVA HANDIHALER 18 MCG inhalation capsule [id number]  INHALE CONTENTS OF 1 CAPSULE ONCE DAILY Cheryl Billings, NP  Active   ?triamcinolone cream (KENALOG) 0.1 % [id number] No APPLY 1 APPLICATION TOPICALLY TWICE DAILY [name], [name], NP Taking Active   ?TRULICITY 3.15 XY/5.8PF SOPN [id number] No INJECT 0.75MG SUBQ ONCE A WEEK Cheryl Billings, NP Taking Active   ?Vitamin D, Ergocalciferol, (DRISDOL) 1.25 MG (50000 UNIT) CAPS capsule [id number] No TAKE 1 CAPSULE BY MOUTH EVERY 7 DAYS Cheryl Billings, NP Taking Active   ? ?  ?  ? ?  ? ?Patient Active Problem List  ? Diagnosis Date Noted  ? Fibrocystic disease of both breasts 05/07/2019  ? IFG (impaired fasting glucose) 05/03/2019  ? Chronic  venous insufficiency 11/09/2018  ? Overactive bladder 09/30/2018  ? Peripheral neuropathy 09/30/2018  ? Apnea 01/02/2016  ? Lymphedema 11/30/2014  ? Dyspnea on exertion 11/30/2014  ? Anxiety 11/29/2014  ? Arthritis 11/29/2014  ? COPD (chronic obstructive pulmonary disease) ([place]) 11/29/2014  ? Clinical depression 11/29/2014  ? H/O eating disorder 11/29/2014  ? Esophageal reflux 11/29/2014  ? Acne inversa 11/29/2014  ? Essential hypertension 11/29/2014  ? HLD (hyperlipidemia) 11/29/2014  ? Adult hypothyroidism 11/29/2014  ? Insomnia 11/29/2014  ? Low back pain 11/29/2014  ? Morbid obesity ([place]) 11/29/2014  ? Posttraumatic stress disorder 11/29/2014  ? Vitamin D deficiency 11/29/2014  ? Nicotine dependence, cigarettes, uncomplicated 79/06/8331  ? ?Conditions to be addressed/monitored per PCP order:  Chronic healthcare management needs, HTN, COPD, GERD, arthritis, anxiety/depression/PTSD, LBP, lymphedema, HLD, hypothyroid ? ?Care Plan : RNCM: COPD (Adult)  ?Updates made by [name], RN since 08/26/2021 12:00 AM  ?  ? ?Problem: RNCM: Psychological Adjustment to Diagnosis (COPD)   ?Priority: Medium  ?Onset Date: 05/22/2020  ?  ? ?Long-Range Goal: RNCM: Adjustment to Disease Achieved   ?Start Date: 05/22/2020  ?Expected End Date: 09/20/2021  ?Recent Progress: On track  ?Priority: High  ?Note:   ?Current  Barriers:  ?Knowledge deficits related to basic understanding of COPD disease process ?Knowledge deficits related to basic COPD self care/management ?Knowledge deficit related to basic understanding of how to use inhalers and how inhaled medications work ?Knowledge deficit related to importance of energy conservation ?Limited Social Support ?Unable to independently manage COPD ?Lacks social connections ?Does not contact provider office for questions/concerns  ?08/26/21: Patient states she has been having increased SOB, using 2 pillows at night, oxygen saturations WNL per patient-patient to see PCP ?Case Manager Clinical  Goal(s): ?patient will report using inhalers as prescribed including rinsing mouth after use ?patient will be able to verbalize understanding of COPD action plan and when to seek appropriate levels of medica

## 2021-08-27 ENCOUNTER — Telehealth: Payer: Self-pay | Admitting: *Deleted

## 2021-08-27 ENCOUNTER — Other Ambulatory Visit: Payer: Self-pay | Admitting: Nurse Practitioner

## 2021-08-27 NOTE — Telephone Encounter (Signed)
? ?  Telephone encounter was:  Successful.  ?08/27/2021 ?Name: Cheryl Chandler MRN: [id number] DOB: 1965/08/13 ? ?Cheryl Chandler is a 56 y.o. year old female who is a primary care patient of [name], NP . The community resource team was consulted for assistance with  Information about services available for a lift however patient , really wants a motorized wheelchair and was advised would refer for Dme assistance at this time ? ?Care guide performed the following interventions: Patient provided with information about care guide support team and interviewed to confirm resource needs ?Discussed resources to assist with lift . ? ?Follow Up Plan:  No further follow up planned at this time. The patient has been provided with needed resources. ? ?Alois Cliche -Berneda Rose ?Care Guide , Embedded Care Coordination ?[place], Care Management  ?[phone] ?[address] [place] , [place] [address] ?Email : [email or web address] ?  ?

## 2021-08-28 NOTE — Telephone Encounter (Signed)
Requested Prescriptions  ?Pending Prescriptions Disp Refills  ?? lisinopril (ZESTRIL) 40 MG tablet [Pharmacy Med Name: LISINOPRIL 40 MG TAB] 90 tablet 0  ?  Sig: TAKE 1 TABLET BY MOUTH ONCE DAILY  ?  ? Cardiovascular:  ACE Inhibitors Failed - 08/27/2021  4:28 PM  ?  ?  Failed - Last BP in normal range  ?  BP Readings from Last 1 Encounters:  ?07/28/21 140/68  ?   ?  ?  Passed - Cr in normal range and within 180 days  ?  Creatinine  ?Date Value Ref Range Status  ?08/05/2014 0.86 mg/dL Final  ?  Comment:  ?  0.44-1.00 ?NOTE: New Reference Range ? 07/03/14 ?  ? ?Creatinine, Ser  ?Date Value Ref Range Status  ?07/28/2021 0.93 0.44 - 1.00 mg/dL Final  ?   ?  ?  Passed - K in normal range and within 180 days  ?  Potassium  ?Date Value Ref Range Status  ?07/28/2021 4.1 3.5 - 5.1 mmol/L Final  ?08/05/2014 4.5 mmol/L Final  ?  Comment:  ?  3.5-5.1 ?NOTE: New Reference Range ? 07/03/14 ?  ?   ?  ?  Passed - Patient is not pregnant  ?  ?  Passed - Valid encounter within last 6 months  ?  Recent Outpatient Visits   ?      ? 1 month ago Essential hypertension  ? [facility] [name], NP  ? 2 months ago Annual physical exam  ? [facility] [name], NP  ? 8 months ago Essential hypertension  ? [facility] [place], [place] P, DO  ? 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 [facility] [name], NP  ? 9 months ago Chronic venous insufficiency  ? [facility] [name], NP  ?  ?  ?Future Appointments   ?        ? In 6 days [name], NP [facility]  ?  ? ?  ?  ?  ?? gabapentin (NEURONTIN) 300 MG capsule [Pharmacy Med Name: GABAPENTIN 300 MG CAP] 270 capsule 0  ?  Sig: TAKE 1 CAPSULE BY MOUTH 3 TIMES DAILY ASNEEDED (NEED OFFICE VISIT FOR FURTHER REFILLS)  ?  ? Neurology: Anticonvulsants - gabapentin Passed - 08/27/2021   4:28 PM  ?  ?  Passed - Cr in normal range and within 360 days  ?  Creatinine  ?Date Value Ref Range Status  ?08/05/2014 0.86 mg/dL Final  ?  Comment:  ?  0.44-1.00 ?NOTE: New Reference Range ? 07/03/14 ?  ? ?Creatinine, Ser  ?Date Value Ref Range Status  ?07/28/2021 0.93 0.44 - 1.00 mg/dL Final  ?   ?  ?  Passed - Completed PHQ-2 or PHQ-9 in the last 360 days  ?  ?  Passed - Valid encounter within last 12 months  ?  Recent Outpatient Visits   ?      ? 1 month ago Essential hypertension  ? [facility] [name], NP  ? 2 months ago Annual physical exam  ? [facility] [name], NP  ? 8 months ago Essential hypertension  ? [facility] [employer], [place] P, DO  ? 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 [facility] [name], NP  ? 9 months ago Chronic venous insufficiency  ? [facility] [name], NP  ?  ?  ?Future Appointments   ?        ? In 6 days [name], NP [facility]  ?  ? ?  ?  ?  ? ?

## 2021-08-31 NOTE — Progress Notes (Signed)
? ? ? ? ?MRN : [id number] ? ?Cheryl Chandler is a 56 y.o. ([birth year]) female who presents with chief complaint of legs swell. ? ?History of Present Illness:  ? ?The patient returns to the office for followup evaluation regarding leg swelling.  The swelling has persisted but with the lymph pump is much, much better controlled. The pain associated with swelling is essentially eliminated. There have not been any interval development of a ulcerations or wounds.  However, she does have a history of right diabetic foot infection and is noting increased pain and some redness in the same area ?  ?The patient denies problems with the pump, noting it is working well and the leggings are in good condition. ?  ?Since the previous visit the patient has been wearing graduated compression stockings occasionally.  She notes she is continuing to use the lymph pump on a routine basis and  has noted significant improvement in the lymphedema.  ?  ?Previous duplex ultrasound shows deep reflux bilaterally ? ?No outpatient medications have been marked as taking for the 09/01/21 encounter (Appointment) with Delana Meyer, Dolores Lory, MD.  ? ? ?Past Medical History:  ?Diagnosis Date  ? Arthritis   ? Asthma   ? COPD (chronic obstructive pulmonary disease) ([place])   ? Hypertension   ? Neuropathy   ? Pericarditis   ? Personal history of urinary calculi 11/29/2014  ? Thyroid disease   ? ? ?Past Surgical History:  ?Procedure Laterality Date  ? APPENDECTOMY    ? arm surgery Left   ? pins  ? fatty tumor removed from back    ? JOINT REPLACEMENT Right   ? LEG SURGERY Bilateral   ? OVARIAN CYST SURGERY Right   ? took piece of right ovary due to cyst  ? TOTAL HIP ARTHROPLASTY Right   ? wisdom teeth removed    ? ? ?Social History ?Social History  ? ?Tobacco Use  ? Smoking status: Every Day  ?  Packs/day: 0.50  ?  Years: 30.00  ?  Pack years: 15.00  ?  Types: Cigarettes  ? Smokeless tobacco: Never  ? Tobacco comments:  ?  Patient quit smoking and recently  started back-08/08/2019  ?Vaping Use  ? Vaping Use: Never used  ?Substance Use Topics  ? Alcohol use: No  ?  Alcohol/week: 0.0 standard drinks  ? Drug use: No  ? ? ?Family History ?Family History  ?Problem Relation Age of Onset  ? Seizures Sister   ?     disorders  ? ? ?Allergies  ?Allergen Reactions  ? Codeine Shortness Of Breath and Rash  ? Percocet [Oxycodone-Acetaminophen] Shortness Of Breath  ? Sulfa Antibiotics Shortness Of Breath and Rash  ? Aspirin Hives  ? Bee Venom   ?  Bee stings  ? Darvon [Propoxyphene]   ?  Darvocet-N 100  ? Latex   ? Oxycodone Nausea And Vomiting  ? Penicillins   ?  Has patient had a PCN reaction causing immediate rash, facial/tongue/throat swelling, SOB or lightheadedness with hypotension: Unknown ?Has patient had a PCN reaction causing severe rash involving mucus membranes or skin necrosis: Unknown ?Has patient had a PCN reaction that required hospitalization: Unknown ?Has patient had a PCN reaction occurring within the last 10 years: Unknown ?If all of the above answers are "NO", then may proceed with Cephalosporin use.  ? ? ? ?REVIEW OF SYSTEMS (Negative unless checked) ? ?Constitutional: [] Weight loss  [] Fever  [] Chills ?Cardiac: [] Chest pain   [] Chest pressure   []   Palpitations   [] Shortness of breath when laying flat   [] Shortness of breath with exertion. ?Vascular:  [] Pain in legs with walking   [x] Pain in legs with standing  [] History of DVT   [] Phlebitis   [x] Swelling in legs   [] Varicose veins   [] Non-healing ulcers ?Pulmonary:   [] Uses home oxygen   [] Productive cough   [] Hemoptysis   [] Wheeze  [x] COPD   [] Asthma ?Neurologic:  [] Dizziness   [] Seizures   [] History of stroke   [] History of TIA  [] Aphasia   [] Vissual changes   [] Weakness or numbness in arm   [] Weakness or numbness in leg ?Musculoskeletal:   [] Joint swelling   [] Joint pain   [] Low back pain ?Hematologic:  [] Easy bruising  [] Easy bleeding   [] Hypercoagulable state   [] Anemic ?Gastrointestinal:  [] Diarrhea    [] Vomiting  [x] Gastroesophageal reflux/heartburn   [] Difficulty swallowing. ?Genitourinary:  [] Chronic kidney disease   [] Difficult urination  [] Frequent urination   [] Blood in urine ?Skin:  [] Rashes   [] Ulcers  ?Psychological:  [] History of anxiety   []  History of major depression. ? ?Physical Examination ? ?There were no vitals filed for this visit. ?There is no height or weight on file to calculate BMI. ?Gen: WD/WN, NAD ?Head: [place]/AT, No temporalis wasting.  ?Ear/Nose/Throat: Hearing grossly intact, nares w/o erythema or drainage, pinna without lesions ?Eyes: PER, EOMI, sclera nonicteric.  ?Neck: Supple, no gross masses.  No JVD.  ?Pulmonary:  Good air movement, no audible wheezing, no use of accessory muscles.  ?Cardiac: RRR, precordium not hyperdynamic. ?Vascular:  scattered varicosities present bilaterally.  Mild venous stasis changes to the legs bilaterally.  3-4+ soft pitting edema  right foot is tender with some redness lateral plantar surface (5th metatarsal) ?Vessel Right Left  ?Radial Palpable Palpable  ?DP Not palpable Not palpable  ?PT Not palpable  Not palpable  ?Gastrointestinal: soft, non-distended. No guarding/no peritoneal signs.  ?Musculoskeletal: M/S 5/5 throughout.  No deformity.  ?Neurologic: CN 2-12 intact. Pain and light touch intact in extremities.  Symmetrical.  Speech is fluent. Motor exam as listed above. ?Psychiatric: Judgment intact, Mood & affect appropriate for pt's clinical situation. ?Dermatologic: Venous rashes no ulcers noted.  No changes consistent with cellulitis. ?Lymph : No lichenification or skin changes of chronic lymphedema. ? ?CBC ?Lab Results  ?Component Value Date  ? WBC 10.8 (H) 07/28/2021  ? HGB 13.6 07/28/2021  ? HCT 43.3 07/28/2021  ? MCV 90.4 07/28/2021  ? PLT 378 07/28/2021  ? ? ?BMET ?   ?Component Value Date/Time  ? NA 141 07/28/2021 1832  ? NA 140 06/04/2021 1432  ? NA 135 08/05/2014 1306  ? K 4.1 07/28/2021 1832  ? K 4.5 08/05/2014 1306  ? CL 103 07/28/2021  1832  ? CL 102 08/05/2014 1306  ? CO2 29 07/28/2021 1832  ? CO2 25 08/05/2014 1306  ? GLUCOSE 104 (H) 07/28/2021 1832  ? GLUCOSE 136 (H) 08/05/2014 1306  ? BUN 12 07/28/2021 1832  ? BUN 20 06/04/2021 1432  ? BUN 23 (H) 08/05/2014 1306  ? CREATININE 0.93 07/28/2021 1832  ? CREATININE 0.86 08/05/2014 1306  ? CALCIUM 8.8 (L) 07/28/2021 1832  ? CALCIUM 8.2 (L) 08/05/2014 1306  ? GFRNONAA >60 07/28/2021 1832  ? GFRNONAA >60 08/05/2014 1306  ? GFRAA 92 04/03/2020 1637  ? GFRAA >60 08/05/2014 1306  ? ?CrCl cannot be calculated (Patient's most recent lab result is older than the maximum 21 days allowed.). ? ?COAG ?No results found for: INR, PROTIME ? ?Radiology ?No  results found. ? ? ?Assessment/Plan ?1. Lymphedema ?Recommend: ? ?No surgery or intervention at this point in time.   ? ?I have reviewed my discussion with the patient regarding lymphedema and why it  causes symptoms.  Patient will continue wearing graduated compression on a daily basis. The patient should put the compression on first thing in the morning and removing them in the evening. The patient should not sleep in the compression.  ? ?In addition, behavioral modification throughout the day will be continued.  This will include frequent elevation (such as in a recliner), use of over the counter pain medications as needed and exercise such as walking. ? ?The systemic causes for chronic edema such as liver, kidney and cardiac etiologies does not appear to have significant changed over the past year.   ? ?The patient will continue aggressive use of the  lymph pump.  This will continue to improve the edema control and prevent sequela such as ulcers and infections.  ? ?The patient will follow-up with me on an annual basis.   ? ?2. Chronic venous insufficiency ?Recommend: ? ?No surgery or intervention at this point in time.   ? ?I have reviewed my discussion with the patient regarding lymphedema and why it  causes symptoms.  Patient will continue wearing graduated  compression on a daily basis. The patient should put the compression on first thing in the morning and removing them in the evening. The patient should not sleep in the compression.  ? ?In addition, behavioral mo

## 2021-09-01 ENCOUNTER — Encounter (INDEPENDENT_AMBULATORY_CARE_PROVIDER_SITE_OTHER): Payer: Self-pay | Admitting: Vascular Surgery

## 2021-09-01 ENCOUNTER — Ambulatory Visit (INDEPENDENT_AMBULATORY_CARE_PROVIDER_SITE_OTHER): Payer: Medicaid Other | Admitting: Vascular Surgery

## 2021-09-01 VITALS — BP 180/71 | HR 64 | Resp 17 | Ht 62.0 in

## 2021-09-01 DIAGNOSIS — E785 Hyperlipidemia, unspecified: Secondary | ICD-10-CM

## 2021-09-01 DIAGNOSIS — I10 Essential (primary) hypertension: Secondary | ICD-10-CM | POA: Diagnosis not present

## 2021-09-01 DIAGNOSIS — J449 Chronic obstructive pulmonary disease, unspecified: Secondary | ICD-10-CM

## 2021-09-01 DIAGNOSIS — I890 Lymphedema, not elsewhere classified: Secondary | ICD-10-CM | POA: Diagnosis not present

## 2021-09-01 DIAGNOSIS — M79671 Pain in right foot: Secondary | ICD-10-CM | POA: Diagnosis not present

## 2021-09-01 DIAGNOSIS — I872 Venous insufficiency (chronic) (peripheral): Secondary | ICD-10-CM

## 2021-09-03 ENCOUNTER — Ambulatory Visit: Payer: Medicaid Other | Admitting: Nurse Practitioner

## 2021-09-08 ENCOUNTER — Other Ambulatory Visit: Payer: Self-pay | Admitting: Pharmacist

## 2021-09-08 DIAGNOSIS — I10 Essential (primary) hypertension: Secondary | ICD-10-CM

## 2021-09-08 NOTE — Chronic Care Management (AMB) (Signed)
? ?   ? ?Chief Complaint  ?Patient presents with  ? High Risk Managed Medicaid  ? ? ?Cheryl Chandler is a 56 y.o. year old female who was referred for medication management by their primary care provider, [name], NP. They presented for a telephone visit. ?  ?They were referred to the pharmacist by their High Risk Managed Medicaid Care Team  for assistance in managing hypertension.  ? ?Subjective: ? ?Care Team: ?Primary Care Provider: [name], NP ; Next Scheduled Visit: needs to reschedule ?Vascular: Arna Medici; 10/01/21 ? ?Medication Access/Adherence ? ?Current Pharmacy:  ?[facility] [address] ?[place] [address] ?Phone: [phone] Fax: [phone] ? ? ?Patient reports affordability concerns with their medications: No  ?Patient reports access/transportation concerns to their pharmacy: No  ?Patient reports adherence concerns with their medications:  No   ? ? ?Hypertension: ? ?Current medications: lisinopril 40 mg daily, hydralazine 25 mg three times daily, amlodipine 5 mg daily  ? ? ?Patient has a validated, automated, upper arm home BP cuff - received one from her sister, but needs batteries. Is going to work on getting batteries this week ? ?Does report having to sleep on 2 pillows at night, and has recently shifted to sleeping in a chair. Reports previously being told she has fluid around her heart and was supposed to have ECHO completed, but her husband had other ongoing procedures at the time that took precedent.  ? ?Reports she had to cancel her PCP appointment last week due to feeling poorly. ? ?Health Maintenance ? ?Health Maintenance Due  ?Topic Date Due  ? TETANUS/TDAP  Never done  ? PAP SMEAR-Modifier  Never done  ? MAMMOGRAM  Never done  ? Zoster Vaccines- Shingrix (1 of 2) Never done  ? COVID-19 Vaccine (4 - Booster for Moderna series) 03/18/2020  ?  ? ?Objective: ?Lab Results  ?Component Value Date  ? HGBA1C 5.9 (H) 06/04/2021  ? ? ?Lab  Results  ?Component Value Date  ? CREATININE 0.93 07/28/2021  ? BUN 12 07/28/2021  ? NA 141 07/28/2021  ? K 4.1 07/28/2021  ? CL 103 07/28/2021  ? CO2 29 07/28/2021  ? ? ?Lab Results  ?Component Value Date  ? CHOL 114 06/04/2021  ? HDL 35 (L) 06/04/2021  ? [place] 62 06/04/2021  ? TRIG 85 06/04/2021  ? CHOLHDL 3.3 06/04/2021  ? ? ?Medications Reviewed Today   ? ? Reviewed by Katha Cabal, MD (Physician) on 09/01/21 at 1317  Med List Status: <None>  ? ?Medication Order Taking? Sig Documenting Provider Last Dose Status Informant  ?ACCU-CHEK GUIDE test strip [id number] Yes USE TO CHECK BLOOD SUGAR 3 TIMES DAILY AS DIRECTED [name], NP Taking Active   ?Accu-Chek Softclix Lancets lancets [id number] Yes CHECK BLOOD SUGAR TWICE DAILY [name], NP Taking Active   ?albuterol (PROVENTIL) (2.5 MG/3ML) 0.083% nebulizer solution [id number] Yes INHALE CONTENTS OF 1 VIAL IN NEBULIZER EVERY 4 HOURS AS NEEDED [name], NP Taking Active   ?amLODipine (NORVASC) 5 MG tablet [id number] Yes TAKE 1 TABLET BY MOUTH ONCE DAILY [name], NP Taking Active   ?atorvastatin (LIPITOR) 80 MG tablet [id number] Yes Take 1 tablet (80 mg total) by mouth daily. [name], NP Taking Active   ?Blood Glucose Monitoring Suppl (ACCU-CHEK GUIDE) w/Device KIT [id number] Yes Use to check blood sugar 3 times a day. [name], NP Taking Active   ?Blood Pressure Monitoring (OMRON 3 SERIES BP MONITOR)  DEVI [id number] Yes Use to check blood pressure as directed [name], NP Taking Active   ?citalopram (CELEXA) 40 MG tablet [id number] Yes Take 1 tablet (40 mg total) by mouth daily. [name], NP Taking Active   ?EPINEPHrine 0.3 mg/0.3 mL IJ SOAJ injection [id number] Yes USE AS DIRECTED. [name], NP Taking Active   ?furosemide (LASIX) 20 MG tablet [id number] Yes TAKE 1 TABLET BY MOUTH ONCE DAILY IN AFTERNOON AS NEEDED LEG EDEMA [name], NP Taking Active   ?gabapentin (NEURONTIN) 300  MG capsule [id number] Yes TAKE 1 CAPSULE BY MOUTH 3 TIMES DAILY ASNEEDED (NEED OFFICE VISIT FOR FURTHER REFILLS) [name], NP Taking Active   ?hydrALAZINE (APRESOLINE) 25 MG tablet [id number] Yes Take 1 tablet (25 mg total) by mouth 3 (three) times daily. [name], NP Taking Active   ?levothyroxine (SYNTHROID) 50 MCG tablet [id number] Yes TAKE 1 TABLET BY MOUTH ONCE DAILY ON AN EMPTY STOMACH. WAIT 30 MINUTES BEFORE TAKING OTHER MEDS. [name], NP Taking Active   ?lisinopril (ZESTRIL) 40 MG tablet [id number] Yes TAKE 1 TABLET BY MOUTH ONCE DAILY [name], NP Taking Active   ?meloxicam ([place]) 15 MG tablet [id number] Yes TAKE 1 TABLET BY MOUTH ONCE DAILY AS NEEDED WITH FOOD OR MILK [name], NP Taking Active   ?methocarbamol (ROBAXIN) 500 MG tablet [id number] Yes Take 500 mg by mouth every 8 (eight) hours as needed for muscle spasms. [provider] Taking Active   ?mometasone-formoterol (DULERA) 100-5 MCG/ACT AERO [id number] Yes INHALE 2 PUFFS BY MOUTH TWICE A DAY. RINSE MOUTH AFTER USE. [name], [place] Taking Active   ?montelukast (SINGULAIR) 10 MG tablet [id number] Yes TAKE 1 TABLET BY MOUTH AT BEDTIME [name], NP Taking Active   ?nitroGLYCERIN (NITROSTAT) 0.4 MG SL tablet [id number] Yes DISSOLVE 1 TABLET UNDER TONGUE AT ONSET OF CHEST PAIN. REPEAT IN 5 MIN IF NOT RESOLVED, MAX 3 DOSES. 911 IF NEEDED. [name], NP Taking Active   ?omeprazole (PRILOSEC) 20 MG capsule [id number] Yes Take 1 capsule (20 mg total) by mouth daily. [name], NP Taking Active   ?SPIRIVA HANDIHALER 18 MCG inhalation capsule [id number] Yes INHALE CONTENTS OF 1 CAPSULE ONCE DAILY [name], NP Taking Active   ?triamcinolone cream (KENALOG) 0.1 % [id number] Yes APPLY 1 APPLICATION TOPICALLY TWICE DAILY [name], [name], NP Taking Active   ?TRULICITY 8.14 GY/1.8HU SOPN [id number] Yes INJECT 0.75MG SUBQ ONCE A WEEK [name], NP Taking Active    ?Vitamin D, Ergocalciferol, (DRISDOL) 1.25 MG (50000 UNIT) CAPS capsule [id number]  TAKE 1 CAPSULE BY MOUTH EVERY 7 DAYS [name], NP  Active   ? ?  ?  ? ?  ? ? ?Assessment/Plan:  ?Care Plan : Medication Management  ?Updates made by Osker Mason, RPH-CPP since 09/08/2021 12:00 AM  ?  ? ?Problem: Hypertension   ?  ? ?Long-Range Goal: BP <130/80   ?Note:   ?Current Barriers:  ?Unable to achieve control of hypertension  ? ?Patient Needs: ?Medication adherence support ? ?Patient Activities: ?Patient will:  ?- take medications as prescribed as evidenced by patient report and record review ?check blood pressure daily, document, and provide at future appointments ? ? ?  ? ? ?Hypertension: ?- Currently uncontrolled ?- Reviewed long term cardiovascular and renal outcomes of uncontrolled blood pressure ?- Reviewed appropriate blood pressure monitoring technique and reviewed goal blood pressure. Recommended to check home blood pressure and heart rate daily ?- Assisted in rescheduling appointment with PCP.  ?- Consider re-ordering ECHO and  referral to cardiology pending evaluation. Consider initiation of thiazide diuretic or spironolactone in combination with lisinopril and amlodipine. ?- Recommend to follow up with PCP  ? ? ? ?Follow Up Plan: follow up call in 4 weeks ? ?Catie Hedwig Morton, PharmD, BCACP ?[place] ?[phone] ? ? ? ?

## 2021-09-08 NOTE — Patient Instructions (Signed)
It was great to speak with you today! ? ?Once you are able to get batteries for your blood pressure machine:  ? ?Check your blood pressure once daily, and any time you have concerning symptoms like headache, chest pain, dizziness, shortness of breath, or vision changes.  ? ?Our goal is less than 130/80. ? ?To appropriately check your blood pressure, make sure you do the following:  ?1) Avoid caffeine, exercise, or tobacco products for 30 minutes before checking. Empty your bladder. ?2) Sit with your back supported in a flat-backed chair. Rest your arm on something flat (arm of the chair, table, etc). ?3) Sit still with your feet flat on the floor, resting, for at least 5 minutes.  ?4) Check your blood pressure. Take 1-2 readings.  ?5) Write down these readings and bring with you to any provider appointments. ? ?Bring your home blood pressure machine with you to a provider's office for accuracy comparison at least once a year.  ? ?Make sure you take your blood pressure medications before you come to any office visit, even if you were asked to fast for labs. ? ?Catie Eppie Gibson, PharmD, BCACP ? [facility] ?[phone] ? ?Visit Information ? ?Cheryl Chandler was given information about Medicaid Managed Care team care coordination services as a part of their [facility] Medicaid benefit. Cheryl Chandler verbally consented to engagement with the [facility] Managed Care team.  ? ?If you are experiencing a medical emergency, please call 911 or report to your local emergency department or urgent care.  ? ?If you have a non-emergency medical problem during routine business hours, please contact your provider's office and ask to speak with a nurse.  ? ?For questions related to your [facility] health plan, please call: [phone] or go here:[email or web address] ? ?If you would like to schedule transportation through your [facility] plan, please call the following number at least 2 days in  advance of your appointment: [phone]. ? You can also use the MTM portal or [employer]TM mobile app to manage your rides. For the portal, please go to [email or web address] ? ? ? ?

## 2021-09-10 DIAGNOSIS — R3981 Functional urinary incontinence: Secondary | ICD-10-CM | POA: Diagnosis not present

## 2021-09-10 DIAGNOSIS — Z993 Dependence on wheelchair: Secondary | ICD-10-CM | POA: Diagnosis not present

## 2021-09-17 NOTE — Progress Notes (Addendum)
BP 138/68   Pulse 71   Temp 98.4 F (36.9 C) (Oral)   LMP 06/09/2017 (Approximate)   SpO2 96%    Subjective:    Patient ID: Cheryl Chandler, female    DOB: June 08, 1965, 56 y.o.   MRN: [id number]  HPI: Cheryl Chandler is a 56 y.o. female  Chief Complaint  Patient presents with   Hypertension   Hyperlipidemia   Shortness of Breath    Follow up , pt states she would like to discuss oxygen rx for at home use and ?shingles on arms    HYPERTENSION Hypertension status: controlled  Satisfied with current treatment? yes Duration of hypertension: years BP monitoring frequency:  daily BP range:  BP medication side effects:  no Medication compliance: excellent compliance Previous BP meds: hydralazine, lasix and lisinopril, amlodipine Aspirin: no Recurrent headaches: no Visual changes: no Palpitations: no Dyspnea: yes Chest pain: no Lower extremity edema: no Dizzy/lightheaded: no   COPD COPD status: uncontrolled Satisfied with current treatment?: no Oxygen use: no Dyspnea frequency: all the time Cough frequency: yes Rescue inhaler frequency:   Limitation of activity: yes Productive cough: yes Last Spirometry:  Pneumovax: Up to Date Influenza: Up to Date  Patient states she has bug bites on her arms that are really itchy.  Requesting cream for her arms.  Relevant past medical, surgical, family and social history reviewed and updated as indicated. Interim medical history since our last visit reviewed. Allergies and medications reviewed and updated.  Review of Systems  Eyes:  Negative for visual disturbance.  Respiratory:  Positive for cough and shortness of breath. Negative for chest tightness.   Cardiovascular:  Negative for chest pain, palpitations and leg swelling.  Neurological:  Negative for dizziness and headaches.   Per HPI unless specifically indicated above     Objective:    BP 138/68   Pulse 71   Temp 98.4 F (36.9 C) (Oral)   LMP  06/09/2017 (Approximate)   SpO2 96%   Wt Readings from Last 3 Encounters:  07/28/21 (!) 320 lb (145.2 kg)  06/04/21 (!) 331 lb (150.1 kg)  02/05/21 290 lb (131.5 kg)    Physical Exam Vitals and nursing note reviewed.  Constitutional:      General: She is not in acute distress.    Appearance: Normal appearance. She is normal weight. She is not ill-appearing, toxic-appearing or diaphoretic.  HENT:     Head: Normocephalic.     Right Ear: External ear normal.     Left Ear: External ear normal.     Nose: Nose normal.     Mouth/Throat:     Mouth: Mucous membranes are moist.     Pharynx: Oropharynx is clear.  Eyes:     General:        Right eye: No discharge.        Left eye: No discharge.     Extraocular Movements: Extraocular movements intact.     Conjunctiva/sclera: Conjunctivae normal.     Pupils: Pupils are equal, round, and reactive to light.  Cardiovascular:     Rate and Rhythm: Normal rate and regular rhythm.     Heart sounds: No murmur heard. Pulmonary:     Effort: Pulmonary effort is normal. No respiratory distress.     Breath sounds: Normal breath sounds. No wheezing or rales.  Musculoskeletal:     Cervical back: Normal range of motion and neck supple.  Skin:    General: Skin is warm and dry.  Capillary Refill: Capillary refill takes less than 2 seconds.  Neurological:     General: No focal deficit present.     Mental Status: She is alert and oriented to person, place, and time. Mental status is at baseline.  Psychiatric:        Mood and Affect: Mood normal.        Behavior: Behavior normal.        Thought Content: Thought content normal.        Judgment: Judgment normal.    Results for orders placed or performed during the hospital encounter of 07/28/21  CBC with Differential  Result Value Ref Range   WBC 10.8 (H) 4.0 - 10.5 K/uL   RBC 4.79 3.87 - 5.11 MIL/uL   Hemoglobin 13.6 12.0 - 15.0 g/dL   HCT 43.3 36.0 - 46.0 %   MCV 90.4 80.0 - 100.0 fL   MCH  28.4 26.0 - 34.0 pg   MCHC 31.4 30.0 - 36.0 g/dL   RDW 15.1 11.5 - 15.5 %   Platelets 378 150 - 400 K/uL   nRBC 0.0 0.0 - 0.2 %   Neutrophils Relative % 68 %   Neutro Abs 7.4 1.7 - 7.7 K/uL   Lymphocytes Relative 22 %   Lymphs Abs 2.4 0.7 - 4.0 K/uL   Monocytes Relative 7 %   Monocytes Absolute 0.8 0.1 - 1.0 K/uL   Eosinophils Relative 2 %   Eosinophils Absolute 0.2 0.0 - 0.5 K/uL   Basophils Relative 1 %   Basophils Absolute 0.1 0.0 - 0.1 K/uL   Immature Granulocytes 0 %   Abs Immature Granulocytes 0.03 0.00 - 0.07 K/uL  Comprehensive metabolic panel  Result Value Ref Range   Sodium 141 135 - 145 mmol/L   Potassium 4.1 3.5 - 5.1 mmol/L   Chloride 103 98 - 111 mmol/L   CO2 29 22 - 32 mmol/L   Glucose, Bld 104 (H) 70 - 99 mg/dL   BUN 12 6 - 20 mg/dL   Creatinine, Ser 0.93 0.44 - 1.00 mg/dL   Calcium 8.8 (L) 8.9 - 10.3 mg/dL   Total Protein 7.6 6.5 - 8.1 g/dL   Albumin 3.5 3.5 - 5.0 g/dL   AST 17 15 - 41 U/L   ALT 20 0 - 44 U/L   Alkaline Phosphatase 95 38 - 126 U/L   Total Bilirubin 0.7 0.3 - 1.2 mg/dL   GFR, Estimated >60 >60 mL/min   Anion gap 9 5 - 15  Troponin I (High Sensitivity)  Result Value Ref Range   Troponin I (High Sensitivity) 4 <18 ng/L      Assessment & Plan:   Problem List Items Addressed This Visit       Cardiovascular and Mediastinum   Essential hypertension - Primary    Chronic. Controlled in office today.  However with SOB patient is experiencing will send patient to Cardiology for ECHO.  Follow up in 1 month for reevaluation.       Relevant Orders   Comp Met (CMET)     Respiratory   COPD (chronic obstructive pulmonary disease) (HCC)    Chronic. Not well controlled. Using Spiriva daily. Reviewed medications with patient during visit.  Will refer to Pulmonology for evaluation.  Patient has very limited mobility due to SOB.  Follow up in 1 month for reevaluation.       Relevant Medications   mometasone-formoterol (DULERA) 100-5 MCG/ACT  AERO   Other Relevant Orders   Ambulatory referral to  Pulmonology     Endocrine   Adult hypothyroidism    Chronic.  Controlled.  Continue with current medication regimen.  Labs ordered today.  Return to clinic in 3 months for reevaluation.  Call sooner if concerns arise.         Relevant Orders   TSH   T4, free   IFG (impaired fasting glucose)    Labs ordered today. Will make recommendations based on lab results.       Relevant Orders   HgB A1c     Other   HLD (hyperlipidemia)    Labs ordered today. Will make recommendations based on lab results.        Relevant Orders   Lipid Profile   Vitamin D deficiency    Labs ordered today. Will make recommendations based on lab results.        Relevant Orders   Vitamin D (25 hydroxy)   Other Visit Diagnoses     Shortness of breath       Relevant Orders   Ambulatory referral to Cardiology   Need for shingles vaccine       Relevant Orders   Varicella-zoster vaccine IM (Shingrix)   Need for tetanus booster       Relevant Orders   Tdap vaccine greater than or equal to 7yo IM   Encounter for medication review       Went through patient's medication bag and matched it with her medication list.  Discussed with patient what each medication is for.        Follow up plan: Return in about 1 month (around 10/19/2021) for HTN, HLD, DM2 FU.

## 2021-09-18 ENCOUNTER — Ambulatory Visit (INDEPENDENT_AMBULATORY_CARE_PROVIDER_SITE_OTHER): Payer: Medicaid Other | Admitting: Nurse Practitioner

## 2021-09-18 ENCOUNTER — Encounter: Payer: Self-pay | Admitting: Nurse Practitioner

## 2021-09-18 ENCOUNTER — Ambulatory Visit: Payer: Self-pay | Admitting: *Deleted

## 2021-09-18 VITALS — BP 138/68 | HR 71 | Temp 98.4°F

## 2021-09-18 DIAGNOSIS — R0602 Shortness of breath: Secondary | ICD-10-CM

## 2021-09-18 DIAGNOSIS — R7301 Impaired fasting glucose: Secondary | ICD-10-CM

## 2021-09-18 DIAGNOSIS — E559 Vitamin D deficiency, unspecified: Secondary | ICD-10-CM

## 2021-09-18 DIAGNOSIS — I10 Essential (primary) hypertension: Secondary | ICD-10-CM | POA: Diagnosis not present

## 2021-09-18 DIAGNOSIS — Z23 Encounter for immunization: Secondary | ICD-10-CM | POA: Diagnosis not present

## 2021-09-18 DIAGNOSIS — Z79899 Other long term (current) drug therapy: Secondary | ICD-10-CM | POA: Diagnosis not present

## 2021-09-18 DIAGNOSIS — E785 Hyperlipidemia, unspecified: Secondary | ICD-10-CM

## 2021-09-18 DIAGNOSIS — E039 Hypothyroidism, unspecified: Secondary | ICD-10-CM | POA: Diagnosis not present

## 2021-09-18 DIAGNOSIS — J449 Chronic obstructive pulmonary disease, unspecified: Secondary | ICD-10-CM

## 2021-09-18 MED ORDER — DULERA 100-5 MCG/ACT IN AERO: INHALATION_SPRAY | RESPIRATORY_TRACT | 5 refills | Status: DC

## 2021-09-18 NOTE — Assessment & Plan Note (Signed)
Chronic. Controlled in office today.  However with SOB patient is experiencing will send patient to Cardiology for ECHO.  Follow up in 1 month for reevaluation.

## 2021-09-18 NOTE — Assessment & Plan Note (Signed)
Labs ordered today.  Will make recommendations based on lab results. ?

## 2021-09-18 NOTE — Assessment & Plan Note (Signed)
Chronic. Not well controlled. Using Spiriva daily. Reviewed medications with patient during visit.  Will refer to Pulmonology for evaluation.  Patient has very limited mobility due to SOB.  Follow up in 1 month for reevaluation.

## 2021-09-18 NOTE — Telephone Encounter (Signed)
Pt states she discussed with [name] today the rash she has on her legs, she thought she was going to call something in, I do not see anything but an old creme from 2021 and she does not know the name of what she used to take nor if maybe [name] was calling in a different med. Pt wants a nurse to fu with her on something to use or maybe refill the old med. FU to advise. She does not know the name of any medications as she does not have them with her. CB   [phone]   Chief Complaint: Rash on arms Symptoms: "Saw [name] today, showed her today, thought she was  going to call me in something, thought it might be bites." Frequency: Weeks Pertinent Negatives: Patient denies  Disposition: [] ED /[] Urgent Care (no appt availability in office) / [] Appointment(In office/virtual)/ []  [facility]/ [] Home Care/ [] Refused Recommended Disposition /[] [facility]/ [x]  Follow-up with PCP Additional Notes: Pt requesting "Antibiotic cream for my arms. Legs stay red, need cream for those also." Please advise.      Reason for Disposition  Mild widespread rash  Answer Assessment - Initial Assessment Questions 1. APPEARANCE of RASH: "Describe the rash." (e.g., spots, blisters, raised areas, skin peeling, scaly)     "Bites" on both arms 2. SIZE: "How big are the spots?" (e.g., tip of pen, eraser, coin; inches, centimeters)      3. LOCATION: "Where is the rash located?"     Both arms, both legs discolored "Always like that." 4. COLOR: "What color is the rash?" (Note: It is difficult to assess rash color in people with darker-colored skin. When this situation occurs, simply ask the caller to describe what they see.)     red 5. ONSET: "When did the rash begin?"     Weeks ago 6. FEVER: "Do you have a fever?" If Yes, ask: "What is your temperature, how was it measured, and when did it start?"     no 7. ITCHING: "Does the rash itch?" If Yes, ask: "How bad is the itch?" (Scale 1-10; or mild,  moderate, severe)     itching 8. CAUSE: "What do you think is causing the rash?"     States "Showed today, thinks its bites or something."  10. OTHER SYMPTOMS: "Do you have any other symptoms?" (e.g., dizziness, headache, sore throat, joint pain)       no  Protocols used: Rash or Redness - [facility]

## 2021-09-18 NOTE — Assessment & Plan Note (Signed)
Chronic.  Controlled.  Continue with current medication regimen.  Labs ordered today.  Return to clinic in 3 months for reevaluation.  Call sooner if concerns arise.   

## 2021-09-19 LAB — COMPREHENSIVE METABOLIC PANEL
ALT: 19 IU/L (ref 0–32)
AST: 15 IU/L (ref 0–40)
Albumin/Globulin Ratio: 1.2 (ref 1.2–2.2)
Albumin: 3.6 g/dL — ABNORMAL LOW (ref 3.8–4.9)
Alkaline Phosphatase: 96 IU/L (ref 44–121)
BUN/Creatinine Ratio: 19 (ref 9–23)
BUN: 14 mg/dL (ref 6–24)
Bilirubin Total: 0.3 mg/dL (ref 0.0–1.2)
CO2: 25 mmol/L (ref 20–29)
Calcium: 8.7 mg/dL (ref 8.7–10.2)
Chloride: 101 mmol/L (ref 96–106)
Creatinine, Ser: 0.75 mg/dL (ref 0.57–1.00)
Globulin, Total: 3 g/dL (ref 1.5–4.5)
Glucose: 87 mg/dL (ref 70–99)
Potassium: 4.5 mmol/L (ref 3.5–5.2)
Sodium: 138 mmol/L (ref 134–144)
Total Protein: 6.6 g/dL (ref 6.0–8.5)
eGFR: 94 mL/min/{1.73_m2} (ref >59)

## 2021-09-19 LAB — LIPID PANEL
Chol/HDL Ratio: 3.9 ratio (ref 0.0–4.4)
Cholesterol, Total: 130 mg/dL (ref 100–199)
HDL: 33 mg/dL — ABNORMAL LOW (ref >39)
LDL Chol Calc (NIH): 78 mg/dL (ref 0–99)
Triglycerides: 99 mg/dL (ref 0–149)
VLDL Cholesterol Cal: 19 mg/dL (ref 5–40)

## 2021-09-19 LAB — T4, FREE: Free T4: 1.16 ng/dL (ref 0.82–1.77)

## 2021-09-19 LAB — TSH: TSH: 2.26 u[IU]/mL (ref 0.450–4.500)

## 2021-09-19 LAB — VITAMIN D 25 HYDROXY (VIT D DEFICIENCY, FRACTURES): Vit D, 25-Hydroxy: 34 ng/mL (ref 30.0–100.0)

## 2021-09-19 LAB — HEMOGLOBIN A1C
Est. average glucose Bld gHb Est-mCnc: 120 mg/dL
Hgb A1c MFr Bld: 5.8 % — ABNORMAL HIGH (ref 4.8–5.6)

## 2021-09-19 MED ORDER — TRIAMCINOLONE ACETONIDE 0.1 % EX CREA: 1.0000 "application " | TOPICAL_CREAM | Freq: Two times a day (BID) | CUTANEOUS | 0 refills | Status: DC

## 2021-09-19 NOTE — Progress Notes (Signed)
Please let patient know that her lab work looks good.  Her liver, kidneys and electrolytes look good.  Her vitamin D is within normal range.  No concerns at this time.  Please let me know if she has any questions.

## 2021-09-19 NOTE — Addendum Note (Signed)
Addended by: [name] on: 09/19/2021 11:14 AM   Modules accepted: Orders

## 2021-09-19 NOTE — Telephone Encounter (Signed)
Medication has been sent in for Kenalog 0.1% per [name]. Pt notified. No further questions.

## 2021-09-25 DIAGNOSIS — Z419 Encounter for procedure for purposes other than remedying health state, unspecified: Secondary | ICD-10-CM | POA: Diagnosis not present

## 2021-09-26 ENCOUNTER — Ambulatory Visit: Payer: Self-pay

## 2021-09-26 ENCOUNTER — Ambulatory Visit: Payer: Medicaid Other

## 2021-10-01 ENCOUNTER — Encounter (INDEPENDENT_AMBULATORY_CARE_PROVIDER_SITE_OTHER): Payer: Medicaid Other

## 2021-10-01 ENCOUNTER — Ambulatory Visit (INDEPENDENT_AMBULATORY_CARE_PROVIDER_SITE_OTHER): Payer: Medicaid Other | Admitting: Nurse Practitioner

## 2021-10-02 ENCOUNTER — Other Ambulatory Visit: Payer: Self-pay | Admitting: Nurse Practitioner

## 2021-10-02 ENCOUNTER — Other Ambulatory Visit: Payer: Self-pay | Admitting: Obstetrics and Gynecology

## 2021-10-02 NOTE — Patient Outreach (Signed)
Medicaid Managed Care   Nurse Care Manager Note  10/02/2021 Name:  Cheryl Chandler MRN:  [id number] DOB:  1965-11-22  Cheryl Chandler is an 56 y.o. year old female who is a primary patient of [name], NP.  The [facility] Managed Care Coordination team was consulted for assistance with:    Chronic healthcare management needs, HTN, arthritis, anxiety/depression/PTSD, LBP, lymphedema, hypothyroid, COPD, GERD, HLD  Cheryl Chandler was given information about Medicaid Managed Care Coordination team services today. [place] Patient agreed to services and verbal consent obtained.  Engaged with patient by telephone for follow up visit in response to provider referral for case management and/or care coordination services.   Assessments/Interventions:  Review of past medical history, allergies, medications, health status, including review of consultants reports, laboratory and other test data, was performed as part of comprehensive evaluation and provision of chronic care management services.  SDOH (Social Determinants of Health) assessments and interventions performed: [name]    Flowsheet Row Most Recent Value  [name]   Housing Interventions Intervention Not Indicated      Care Plan  Allergies  Allergen Reactions   Codeine Shortness Of Breath and Rash   Percocet [Oxycodone-Acetaminophen] Shortness Of Breath   Sulfa Antibiotics Shortness Of Breath and Rash   Aspirin Hives   Bee Venom     Bee stings   Darvon [Propoxyphene]     Darvocet-N 100   Latex    Oxycodone Nausea And Vomiting   Penicillins     Has patient had a PCN reaction causing immediate rash, facial/tongue/throat swelling, SOB or lightheadedness with hypotension: Unknown Has patient had a PCN reaction causing severe rash involving mucus membranes or skin necrosis: Unknown Has patient had a PCN reaction that required hospitalization: Unknown Has patient had a PCN reaction  occurring within the last 10 years: Unknown If all of the above answers are "NO", then may proceed with Cephalosporin use.   Medications Reviewed Today     Reviewed by [name], RN (Registered Nurse) on 10/02/21 at 1327  Med List Status: <None>   Medication Order Taking? Sig Documenting Provider Last Dose Status Informant  ACCU-CHEK GUIDE test strip [id number] No USE TO CHECK BLOOD SUGAR 3 TIMES DAILY AS DIRECTED [name], NP Taking Active   Accu-Chek Softclix Lancets lancets [id number] No CHECK BLOOD SUGAR TWICE DAILY [name], NP Taking Active   albuterol (PROVENTIL) (2.5 MG/3ML) 0.083% nebulizer solution [id number] No INHALE CONTENTS OF 1 VIAL IN NEBULIZER EVERY 4 HOURS AS NEEDED [name], NP Taking Active   amLODipine (NORVASC) 5 MG tablet [id number] No TAKE 1 TABLET BY MOUTH ONCE DAILY [name], NP Taking Active   atorvastatin (LIPITOR) 80 MG tablet [id number] No Take 1 tablet (80 mg total) by mouth daily. [name], NP Taking Active   Blood Glucose Monitoring Suppl (ACCU-CHEK GUIDE) w/Device KIT [id number] No Use to check blood sugar 3 times a day. [name], NP Taking Active   Blood Pressure Monitoring (OMRON 3 SERIES BP MONITOR) DEVI [id number] No Use to check blood pressure as directed [name], NP Taking Active   citalopram (CELEXA) 40 MG tablet [id number] No Take 1 tablet (40 mg total) by mouth daily. [name], NP Taking Active   EPINEPHrine 0.3 mg/0.3 mL IJ SOAJ injection [id number] No USE AS DIRECTED. [name], NP Taking Active   furosemide (LASIX) 20 MG tablet [id number] No TAKE 1 TABLET BY MOUTH ONCE DAILY IN AFTERNOON AS NEEDED LEG EDEMA [name],  NP Taking Active   gabapentin (NEURONTIN) 300 MG capsule [id number] No TAKE 1 CAPSULE BY MOUTH 3 TIMES DAILY ASNEEDED (NEED OFFICE VISIT FOR FURTHER REFILLS) [name], NP Taking Active   hydrALAZINE (APRESOLINE) 25 MG tablet [id number] No Take 1  tablet (25 mg total) by mouth 3 (three) times daily. [name], NP Taking Active   levothyroxine (SYNTHROID) 50 MCG tablet [id number] No TAKE 1 TABLET BY MOUTH ONCE DAILY ON AN EMPTY STOMACH. WAIT 30 MINUTES BEFORE TAKING OTHER MEDS. [name], NP Taking Active   lisinopril (ZESTRIL) 40 MG tablet [id number] No TAKE 1 TABLET BY MOUTH ONCE DAILY [name], NP Taking Active   meloxicam (MOBIC) 15 MG tablet [id number] No TAKE 1 TABLET BY MOUTH ONCE DAILY AS NEEDED WITH FOOD OR MILK [name], NP Taking Active   methocarbamol (ROBAXIN) 500 MG tablet [id number] No Take 500 mg by mouth every 8 (eight) hours as needed for muscle spasms. [provider] Taking Active   mometasone-formoterol (DULERA) 100-5 MCG/ACT AERO [id number]  INHALE 2 PUFFS BY MOUTH TWICE A DAY. RINSE MOUTH AFTER USE. [name], NP  Active   montelukast (SINGULAIR) 10 MG tablet [id number] No TAKE 1 TABLET BY MOUTH AT BEDTIME [name], NP Taking Active   nitroGLYCERIN (NITROSTAT) 0.4 MG SL tablet [id number] No DISSOLVE 1 TABLET UNDER TONGUE AT ONSET OF CHEST PAIN. REPEAT IN 5 MIN IF NOT RESOLVED, MAX 3 DOSES. 911 IF NEEDED. [name], NP Taking Active   omeprazole (PRILOSEC) 20 MG capsule [id number] No Take 1 capsule (20 mg total) by mouth daily. [name], NP Taking Active   SPIRIVA HANDIHALER 18 MCG inhalation capsule [id number] No INHALE CONTENTS OF 1 CAPSULE ONCE DAILY [name], NP Taking Active   triamcinolone cream (KENALOG) 0.1 % [id number] No APPLY 1 APPLICATION TOPICALLY TWICE DAILY [name], [name], NP Taking Active   triamcinolone cream (KENALOG) 0.1 % [id number]  Apply 1 application. topically 2 (two) times daily. [name], NP  Active   TRULICITY 3.71 GG/2.6RS [name] [id number] No INJECT 0.75MG SUBQ ONCE A [name], NP Taking Active   Vitamin D, Ergocalciferol, (DRISDOL) 1.25 MG (50000 UNIT) CAPS capsule [id number] No TAKE 1 CAPSULE BY  MOUTH EVERY 7 DAYS [name], NP Taking Active            Patient Active Problem List   Diagnosis Date Noted   Foot pain, right 09/01/2021   Fibrocystic disease of both breasts 05/07/2019   IFG (impaired fasting glucose) 05/03/2019   Chronic venous insufficiency 11/09/2018   Overactive bladder 09/30/2018   Peripheral neuropathy 09/30/2018   Apnea 01/02/2016   Lymphedema 11/30/2014   Dyspnea on exertion 11/30/2014   Anxiety 11/29/2014   Arthritis 11/29/2014   COPD (chronic obstructive pulmonary disease) ([place]) 11/29/2014   Clinical depression 11/29/2014   H/O eating disorder 11/29/2014   Esophageal reflux 11/29/2014   Acne inversa 11/29/2014   Essential hypertension 11/29/2014   HLD (hyperlipidemia) 11/29/2014   Adult hypothyroidism 11/29/2014   Insomnia 11/29/2014   Low back pain 11/29/2014   Morbid obesity ([place]) 11/29/2014   Posttraumatic stress disorder 11/29/2014   Vitamin D deficiency 11/29/2014   Nicotine dependence, cigarettes, uncomplicated 93/71/6967   Conditions to be addressed/monitored per PCP order:  Chronic healthcare management needs, HTN, arthritis, anxiety/depression/PTSD, LBP, lymphedema, hypothyroid, COPD, GERD, HLD  Care Plan : RNCM: COPD (Adult)  Updates made by [name], RN since 10/02/2021 12:00 AM     Problem: RNCM: Psychological Adjustment to Diagnosis (COPD)  Priority: Medium  Onset Date: 05/22/2020     Long-Range Goal: RNCM: Adjustment to Disease Achieved   Start Date: 05/22/2020  Expected End Date: 09/20/2021  Recent Progress: On track  Priority: High  Note:   Current Barriers:  Knowledge deficits related to basic understanding of COPD disease process Knowledge deficits related to basic COPD self care/management Knowledge deficit related to basic understanding of how to use inhalers and how inhaled medications work Knowledge deficit related to importance of energy conservation Limited Social Support Unable to independently  manage COPD Lacks social connections Does not contact provider office for questions/concerns  09/01/21:  patient with upcoming appointments with PCP/PULM/CARDS, SOB unchanged-still using 2 pillows and Oxygen saturations unchanged, WNL per patient Case Manager Clinical Goal(s): patient will report using inhalers as prescribed including rinsing mouth after use patient will be able to verbalize understanding of COPD action plan and when to seek appropriate levels of medical care  patient will engage in lite exercise as tolerated to build/regain stamina and strength and reduce shortness of breath through activity tolerance  patient will verbalize basic understanding of COPD disease process and self care activities  Interventions:  Collaboration with [name], NP regarding development and update of comprehensive plan of care as evidenced by provider attestation and co-signature Inter-disciplinary care team collaboration (see longitudinal plan of care) UNABLE to independently: manage COPD Provided patient with basic written and verbal COPD education on self care/management/and exacerbation prevention Provided patient with COPD action plan and reinforced importance of daily self assessment.  Provided written and verbal instructions on pursed lip breathing and utilized returned demonstration as teach back Provided instruction about proper use of medications used for management of COPD including inhalers Advised patient to self assesses COPD action plan zone and make appointment with provider if in the yellow zone for 48 hours without improvement. Provided patient with education about the role of exercise in the management of COPD Advised patient to engage in light exercise as tolerated 3-5 days a week Provided education about and advised patient to utilize infection prevention strategies to reduce risk of respiratory infection. Patient Goals/Self-Care Activities:  - decision-making supported -  depression screen reviewed - emotional support provided - family involvement promoted - problem-solving facilitated - relaxation techniques promoted - verbalization of feelings encouraged Follow Up Plan: Telephone follow up appointment with care management team member scheduled.   Problem: RNCM: Symptom Exacerbation (COPD)   Priority: High  Onset Date: 05/22/2020     Long-Range Goal: RNCM: Smoking Cessation Symptom Exacerbation Prevented or Minimized   Start Date: 05/22/2020  Expected End Date: 12/21/2021  Recent Progress: Not on track  Priority: High  Note:   Current Barriers:  Unable to independently stop smoking  Does not adhere to provider recommendations re: smoking cessation  Lacks social connections Does not contact provider office for questions/concerns Tobacco abuse of >40 years; trying to stop-says she has stopped Previous quit attempts, unsuccessful several successful using patches, gum- has a pattern of stopping and then starting back  Reports smoking within 30 minutes of waking up Reports triggers to smoke include: stress, chronic conditions  Reports motivation to quit smoking includes: knows it will help her breathing and other chronic conditions  On a scale of 1-10, reports MOTIVATION to quit is 8 On a scale of 1-10, reports CONFIDENCE in quitting is 7 10/02/21:  patient states she is smoking around 3 cigarettes a day-trying to stop Clinical Goal(s):  patient will work with Psychiatric nurse  and provider towards tobacco cessation  Interventions: Collaboration with [name], NP regarding development and update of comprehensive plan of care as evidenced by provider attestation and co-signature Inter-disciplinary care team collaboration (see longitudinal plan of care) Evaluation of current treatment plan reviewed.  Provided contact information for [place] [facility] (1-800-QUIT-NOW). Patient will outreach this group for support. Update  01/22/21:  Patient states she has utilized [employer] for resources. Discussed plans with patient for ongoing care management follow up and provided patient with direct contact information for care management team Provided patient with printed smoking cessation educational materials Provided contact information for [place] [facility] (1-800-QUIT-NOW). Patient will outreach this group for support. Patient Goals/Self-Care Activities patient will:  - mindfulness, talking to family and friends as a habit during cravings  - verbally commit to reducing tobacco consumption - barriers to lifestyle changes reviewed and addressed - barriers to treatment reviewed and addressed - breathing techniques encouraged - modification of home and work environment promoted - rescue (action) plan reviewed - signs/symptoms of infection reviewed - signs/symptoms of worsening disease assessed - treatment plan reviewed Follow Up Plan: Telephone follow up appointment with care management team member scheduled.    Care Plan : RNCM: Hypertension (Adult)  Updates made by [name], RN since 10/02/2021 12:00 AM     Problem: RNCM: Hypertension (Hypertension)   Priority: Medium  Onset Date: 05/22/2020     Long-Range Goal: RNCM: Hypertension Monitored   Start Date: 05/22/2020  Expected End Date: 12/21/2021  Recent Progress: On track  Priority: Medium  Note:    Current Barriers:  Knowledge Deficits related to basic understanding of hypertension pathophysiology and self care management Knowledge Deficits related to understanding of medications prescribed for management of hypertension Limited Social Support Unable to independently manage HTN Lacks social connections Does not contact provider office for questions/concern 10/02/21:  BP 138/68 at most recent PCP visit.  Patient checks blood pressure everyday, 165/60 today per patient-patient will recheck and notify PCP as needed. Case Manager Clinical Goal(s):   patient  will verbalize understanding of plan for hypertension management patient will demonstrate improved adherence to prescribed treatment plan for hypertension as evidenced by taking all medications as prescribed, monitoring and recording blood pressure as directed, adhering to low sodium/DASH diet patient will demonstrate improved health management independence as evidenced by checking blood pressure as directed and notifying PCP if SBP>160 or DBP > 90, taking all medications as prescribe, and adhering to a low sodium diet as discussed. Interventions:  Collaboration with [name], NP regarding development and update of comprehensive plan of care as evidenced by provider attestation and co-signature Inter-disciplinary care team collaboration (see longitudinal plan of care) Evaluation of current treatment plan related to hypertension self management and patient's adherence to plan as established by provider.  Provided education to patient re: stroke prevention, s/s of heart attack and stroke, DASH diet, complications of uncontrolled blood pressure Reviewed medications with patient and discussed importance of compliance.  Discussed plans with patient for ongoing care management follow up and provided patient with direct contact information for care management team Advised patient, providing education and rationale, to monitor blood pressure daily and record, calling PCP for findings outside established parameters.  Patient Goals/Self-Care Activities Over the next 120 days, patient will:  - Self administers medications as prescribed Attends all scheduled provider appointments Calls provider office for new concerns, questions, or BP outside discussed parameters Checks BP and records as discussed Follows a low sodium diet/DASH diet -  blood pressure trends reviewed - depression screen reviewed - home or ambulatory blood pressure monitoring encouraged Follow Up Plan: Telephone follow up appointment  with care management team member scheduled.  To see PCP regarding BP-has appt scheduled   Care Plan : RNCM: Depression (Adult)  Updates made by [name], RN since 10/02/2021 12:00 AM     Problem: RNCM: Symptoms (Depression)   Priority: High  Onset Date: 12/02/2020     Long-Range Goal: RNCM: Symptoms Monitored and Managed   Start Date: 12/02/2020  Expected End Date: 11/21/2022  Recent Progress: Not on track  Priority: High  Note:   Current Barriers:  Ineffective Self Health Maintenance in a patient with Anxiety and Depression Unable to independently manage depression and anxiety as evidence of multiple chronic conditions and the patient feeling her health is out of control  Does not attend all scheduled provider appointments Lacks social connections Unable to perform IADLs independently Does not contact provider office for questions/concerns 10/03/26:  patient speaking with LCSW-follow up appt scheduled. Clinical Goal(s):  Collaboration with [name], NP regarding development and update of comprehensive plan of care as evidenced by provider attestation and co-signature Inter-disciplinary care team collaboration (see longitudinal plan of care) patient will work with care management team to address care coordination and chronic disease management needs related to Disease Management Educational Needs Care Coordination Mental Health Counseling Level of Care Concerns   Interventions:  Evaluation of current treatment plan related to Anxiety and Depression, Financial constraints related to insurance not approving care for the patient to go to wound care , Limited social support, Level of care concerns, ADL IADL limitations, Mental Health Concerns , Family and relationship dysfunction, Substance abuse issues -  smoker , and Inability to perform IADL's independently self-management and patient's adherence to plan as established by provider. Collaboration with [name], NP  regarding development and update of comprehensive plan of care as evidenced by provider attestation       and co-signature Inter-disciplinary care team collaboration (see longitudinal plan of care) Discussed plans with patient for ongoing care management follow up and provided patient with direct contact information for care management team Evaluation of depression and anxiety.  LCSW referral for increased stress-completed Collaborated with LCSW  Self Care Activities:  Patient verbalizes understanding of plan to effectively manage depression and anxiety Self administers medications as prescribed Attends all scheduled provider appointments Calls pharmacy for medication refills Attends church or other social activities Performs ADL's independently Performs IADL's independently Calls provider office for new concerns or questions Work with the [facility] team to meet needs for continued CCM services  Patient Goals: - activity or exercise based on tolerance encouraged - depression screen reviewed - emotional support provided - healthy lifestyle promoted - medication side effects monitored and managed - pain managed - participation in mental health treatment encouraged - quality of sleep assessed - response to mental health treatment monitored - response to pharmacologic therapy monitored - sleep hygiene techniques encouraged - social activities and relationships encouraged - substance use assessed Follow Up Plan: The care management team will reach out to the patient again over the next 30 to 60 days.    Follow Up:  Patient agrees to Care Plan and Follow-up.  Plan: The Managed Medicaid care management team will reach out to the patient again over the next 30 days. and The  Patient has been provided with contact information for the Managed Medicaid care management team and has been advised to call with any  health related questions or concerns.  Date/time of next scheduled RN care  management/care coordination outreach: 11/03/21 at 10.

## 2021-10-02 NOTE — Patient Instructions (Signed)
Visit Information  [name] was given information about Medicaid Managed Care team care coordination services as a part of their [facility]. Cheryl Chandler verbally consented to engagement with the [facility].   If you are experiencing a medical emergency, please call 911 or report to your local emergency department or urgent care.   If you have a non-emergency medical problem during routine business hours, please contact your provider's office and ask to speak with a nurse.   For questions related to your [facility] health plan, please call: [phone] or go here:[email or web address]  If you would like to schedule transportation through your [facility] plan, please call the following number at least 2 days in advance of your appointment: [phone].  You can also use the MTM portal or MTM mobile app to manage your rides. For the portal, please go to [email or web address].  Call the [facility] Crisis Line at [phone], at any time, 24 hours a day, 7 days a week. If you are in danger or need immediate medical attention call 911.  If you would like help to quit smoking, call 1-800-QUIT-NOW (614-424-72521-208-068-8636) OR Espaol: 1-855-Djelo-Ya (4-132-440-1027(1-317 332 5272) o para ms informacin haga clic aqu or Text READY to 253-664200-400 to register via text  Cheryl Chandler - following are the goals we discussed in your visit today:   Goals Addressed             This Visit's Progress    RNCM: Keep Skin Clean and Dry       Follow Up Date: 11/03/21  - clean and dry skin well - keep skin dry - use a fragrance-free lotion on skin - wear a protective pad or garment    Why is this important?   Leaking urine (pee) can cause soreness from skin rashes and redness.  This is caused by skin being exposed to urine (pee).    10/02/21:  Patient without complaint today-is using ointment on skin prescribed by PCP     RNCM: Track and Manage My  Symptoms-Depression       Timeframe:  Long-Range Goal Priority:  High Start Date:    12-02-2020                         Expected End Date:   ongoing                Follow Up Date: 11/03/21   - avoid negative self-talk - develop a personal safety plan - develop a plan to deal with triggers like holidays, anniversaries - exercise at least 2 to 3 times per week - have a plan for how to handle bad days - journal feelings and what helps to feel better or worse - spend time or talk with others at least 2 to 3 times per week - spend time or talk with others every day - watch for early signs of feeling worse    Why is this important?   Keeping track of your progress will help your treatment team find the right mix of medicine and therapy for you.  Write in your journal every day.  Day-to-day changes in depression symptoms are normal. It may be more helpful to check your progress at the end of each week instead of every day.    11/03/21:  LCSW following patient      RNCM: Track and Manage My Triggers-COPD       Timeframe:  Long-Range Goal Priority:  Medium  Start Date:     05-22-2020                        Expected End Date:   ongoing          Follow Up Date: 11/03/21   - avoid second hand smoke - eliminate smoking in my home - identify and avoid work-related triggers - identify and remove indoor air pollutants - limit outdoor activity during cold weather - listen for public air quality announcements every day    Why is this important?   Triggers are activities or things, like tobacco smoke or cold weather, that make your COPD (chronic obstructive pulmonary disease) flare-up.  Knowing these triggers helps you plan how to stay away from them.  When you cannot remove them, you can learn how to manage them.    10/02/21:  Patient with upcoming PCP/CARD/PULM appts.  Continues to use 2 pillows to elevate-Oxygen sats WNL per patient.   The patient verbalized understanding of instructions,  educational materials, and care plan provided today and agreed to receive a mailed copy of patient instructions, educational materials, and care plan.   The Managed Medicaid care management team will reach out to the patient again over the next 30 days.  The  Patient has been provided with contact information for the Managed Medicaid care management team and has been advised to call with any health related questions or concerns.   Kathi Der RN, BSN [place]  [facility] Care Management Coordinator - Managed Medicaid High Risk [phone]    Following is a copy of your plan of care:  Care Plan : RNCM: COPD (Adult)  Updates made by [name], RN since 10/02/2021 12:00 AM     Problem: RNCM: Psychological Adjustment to Diagnosis (COPD)   Priority: Medium  Onset Date: 05/22/2020     Long-Range Goal: RNCM: Adjustment to Disease Achieved   Start Date: 05/22/2020  Expected End Date: 09/20/2021  Recent Progress: On track  Priority: High  Note:   Current Barriers:  Knowledge deficits related to basic understanding of COPD disease process Knowledge deficits related to basic COPD self care/management Knowledge deficit related to basic understanding of how to use inhalers and how inhaled medications work Knowledge deficit related to importance of energy conservation Limited Social Support Unable to independently manage COPD Lacks social connections Does not contact provider office for questions/concerns  09/01/21:  patient with upcoming appointments with PCP/PULM/CARDS, SOB unchanged-still using 2 pillows and Oxygen saturations unchanged, WNL per patient Case Manager Clinical Goal(s): patient will report using inhalers as prescribed including rinsing mouth after use patient will be able to verbalize understanding of COPD action plan and when to seek appropriate levels of medical care  patient will engage in lite exercise as tolerated to build/regain stamina and strength  and reduce shortness of breath through activity tolerance  patient will verbalize basic understanding of COPD disease process and self care activities  Interventions:  Collaboration with [name], NP regarding development and update of comprehensive plan of care as evidenced by provider attestation and co-signature Inter-disciplinary care team collaboration (see longitudinal plan of care) UNABLE to independently: manage COPD Provided patient with basic written and verbal COPD education on self care/management/and exacerbation prevention Provided patient with COPD action plan and reinforced importance of daily self assessment.  Provided written and verbal instructions on pursed lip breathing and utilized returned demonstration as teach back Provided instruction about proper use of medications used  for management of COPD including inhalers Advised patient to self assesses COPD action plan zone and make appointment with provider if in the yellow zone for 48 hours without improvement. Provided patient with education about the role of exercise in the management of COPD Advised patient to engage in light exercise as tolerated 3-5 days a week Provided education about and advised patient to utilize infection prevention strategies to reduce risk of respiratory infection. Patient Goals/Self-Care Activities:  - decision-making supported - depression screen reviewed - emotional support provided - family involvement promoted - problem-solving facilitated - relaxation techniques promoted - verbalization of feelings encouraged Follow Up Plan: Telephone follow up appointment with care management team member scheduled.     Problem: RNCM: Symptom Exacerbation (COPD)   Priority: High  Onset Date: 05/22/2020     Long-Range Goal: RNCM: Smoking Cessation Symptom Exacerbation Prevented or Minimized   Start Date: 05/22/2020  Expected End Date: 12/21/2021  Recent Progress: Not on track  Priority:  High  Note:   Current Barriers:  Unable to independently stop smoking  Does not adhere to provider recommendations re: smoking cessation  Lacks social connections Does not contact provider office for questions/concerns Tobacco abuse of >40 years; trying to stop-says she has stopped Previous quit attempts, unsuccessful several successful using patches, gum- has a pattern of stopping and then starting back  Reports smoking within 30 minutes of waking up Reports triggers to smoke include: stress, chronic conditions  Reports motivation to quit smoking includes: knows it will help her breathing and other chronic conditions  On a scale of 1-10, reports MOTIVATION to quit is 8 On a scale of 1-10, reports CONFIDENCE in quitting is 7 10/02/21:  patient states she is smoking around 3 cigarettes a day-trying to stop Clinical Goal(s):  patient will work with RN Case Solicitor and provider towards tobacco cessation  Interventions: Collaboration with [name], NP regarding development and update of comprehensive plan of care as evidenced by provider attestation and co-signature Inter-disciplinary care team collaboration (see longitudinal plan of care) Evaluation of current treatment plan reviewed.  Provided contact information for [place] Quit Line (1-800-QUIT-NOW). Patient will outreach this group for support. Update 01/22/21:  Patient states she has utilized [employer] for resources. Discussed plans with patient for ongoing care management follow up and provided patient with direct contact information for care management team Provided patient with printed smoking cessation educational materials Provided contact information for [place] Quit Line (1-800-QUIT-NOW). Patient will outreach this group for support. Patient Goals/Self-Care Activities patient will:  - mindfulness, talking to family and friends as a habit during cravings  - verbally commit to reducing tobacco  consumption - barriers to lifestyle changes reviewed and addressed - barriers to treatment reviewed and addressed - breathing techniques encouraged - modification of home and work environment promoted - rescue (action) plan reviewed - signs/symptoms of infection reviewed - signs/symptoms of worsening disease assessed - treatment plan reviewed Follow Up Plan: Telephone follow up appointment with care management team member scheduled.     Care Plan : RNCM: Hypertension (Adult)  Updates made by [name], RN since 10/02/2021 12:00 AM     Problem: RNCM: Hypertension (Hypertension)   Priority: Medium  Onset Date: 05/22/2020     Long-Range Goal: RNCM: Hypertension Monitored   Start Date: 05/22/2020  Expected End Date: 12/21/2021  Recent Progress: On track  Priority: Medium  Note:    Current Barriers:  Knowledge Deficits related to basic understanding of hypertension  pathophysiology and self care management Knowledge Deficits related to understanding of medications prescribed for management of hypertension Limited Social Support Unable to independently manage HTN Lacks social connections Does not contact provider office for questions/concern 10/02/21:  BP 138/68 at most recent PCP visit.  Patient checks blood pressure everyday, 165/60 today per patient-patient will recheck and notify PCP as needed. Case Manager Clinical Goal(s):   patient will verbalize understanding of plan for hypertension management patient will demonstrate improved adherence to prescribed treatment plan for hypertension as evidenced by taking all medications as prescribed, monitoring and recording blood pressure as directed, adhering to low sodium/DASH diet patient will demonstrate improved health management independence as evidenced by checking blood pressure as directed and notifying PCP if SBP>160 or DBP > 90, taking all medications as prescribe, and adhering to a low sodium diet as discussed. Interventions:   Collaboration with [name], NP regarding development and update of comprehensive plan of care as evidenced by provider attestation and co-signature Inter-disciplinary care team collaboration (see longitudinal plan of care) Evaluation of current treatment plan related to hypertension self management and patient's adherence to plan as established by provider.  Provided education to patient re: stroke prevention, s/s of heart attack and stroke, DASH diet, complications of uncontrolled blood pressure Reviewed medications with patient and discussed importance of compliance.  Discussed plans with patient for ongoing care management follow up and provided patient with direct contact information for care management team Advised patient, providing education and rationale, to monitor blood pressure daily and record, calling PCP for findings outside established parameters.  Patient Goals/Self-Care Activities Over the next 120 days, patient will:  - Self administers medications as prescribed Attends all scheduled provider appointments Calls provider office for new concerns, questions, or BP outside discussed parameters Checks BP and records as discussed Follows a low sodium diet/DASH diet - blood pressure trends reviewed - depression screen reviewed - home or ambulatory blood pressure monitoring encouraged Follow Up Plan: Telephone follow up appointment with care management team member scheduled.  To see PCP regarding BP-has appt scheduled    Care Plan : RNCM: Depression (Adult)  Updates made by [name], RN since 10/02/2021 12:00 AM     Problem: RNCM: Symptoms (Depression)   Priority: High  Onset Date: 12/02/2020     Long-Range Goal: RNCM: Symptoms Monitored and Managed   Start Date: 12/02/2020  Expected End Date: 11/21/2022  Recent Progress: Not on track  Priority: High  Note:   Current Barriers:  Ineffective Self Health Maintenance in a patient with Anxiety and  Depression Unable to independently manage depression and anxiety as evidence of multiple chronic conditions and the patient feeling her health is out of control  Does not attend all scheduled provider appointments Lacks social connections Unable to perform IADLs independently Does not contact provider office for questions/concerns 10/03/26:  patient speaking with LCSW-follow up appt scheduled. Clinical Goal(s):  Collaboration with [name], NP regarding development and update of comprehensive plan of care as evidenced by provider attestation and co-signature Inter-disciplinary care team collaboration (see longitudinal plan of care) patient will work with care management team to address care coordination and chronic disease management needs related to Disease Management Educational Needs Care Coordination Mental Health Counseling Level of Care Concerns   Interventions:  Evaluation of current treatment plan related to Anxiety and Depression, Financial constraints related to insurance not approving care for the patient to go to wound care , Limited social support, Level of care concerns, ADL IADL limitations,  Mental Health Concerns , Family and relationship dysfunction, Substance abuse issues -  smoker , and Inability to perform IADL's independently self-management and patient's adherence to plan as established by provider. Collaboration with [place], KLarae Groomsding development and update of comprehensive plan of care as evidenced by provider attestation       and co-signature Inter-disciplinary care team collaboration (see longitudinal plan of care) Discussed plans with patient for ongoing care management follow up and provided patient with direct contact information for care management team Evaluation of depression and anxiety.  LCSW referral for increased stress-completed Collaborated with LCSW  Self Care Activities:  Patient verbalizes understanding of plan to effectively  manage depression and anxiety Self administers medications as prescribed Attends all scheduled provider appointments Calls pharmacy for medication refills Attends church or other social activities Performs ADL's independently Performs IADL's independently Calls provider office for new concerns or questions Work with the [facility] team to meet needs for continued CCM services  Patient Goals: - activity or exercise based on tolerance encouraged - depression screen reviewed - emotional support provided - healthy lifestyle promoted - medication side effects monitored and managed - pain managed - participation in mental health treatment encouraged - quality of sleep assessed - response to mental health treatment monitored - response to pharmacologic therapy monitored - sleep hygiene techniques encouraged - social activities and relationships encouraged - substance use assessed Follow Up Plan: The care management team will reach out to the patient again over the next 30 to 60 days.

## 2021-10-03 ENCOUNTER — Ambulatory Visit: Payer: Medicaid Other

## 2021-10-03 NOTE — Telephone Encounter (Signed)
Requested Prescriptions  Pending Prescriptions Disp Refills  . furosemide (LASIX) 20 MG tablet [Pharmacy Med Name: FUROSEMIDE 20 MG TAB] 90 tablet 1    Sig: TAKE 1 TABLET BY MOUTH ONCE DAILY IN AFTERNOON AS NEEDED LEG EDEMA     Cardiovascular:  Diuretics - Loop Failed - 10/02/2021 12:57 PM      Failed - Mg Level in normal range and within 180 days    No results found for: "MG"       Passed - K in normal range and within 180 days    Potassium  Date Value Ref Range Status  09/18/2021 4.5 3.5 - 5.2 mmol/L Final  08/05/2014 4.5 mmol/L Final    Comment:    3.5-5.1 NOTE: New Reference Range  07/03/14          Passed - Ca in normal range and within 180 days    Calcium  Date Value Ref Range Status  09/18/2021 8.7 8.7 - 10.2 mg/dL Final   Calcium, Total  Date Value Ref Range Status  08/05/2014 8.2 (L) mg/dL Final    Comment:    8.9-10.3 NOTE: New Reference Range  07/03/14          Passed - Na in normal range and within 180 days    Sodium  Date Value Ref Range Status  09/18/2021 138 134 - 144 mmol/L Final  08/05/2014 135 mmol/L Final    Comment:    135-145 NOTE: New Reference Range  07/03/14          Passed - Cr in normal range and within 180 days    Creatinine  Date Value Ref Range Status  08/05/2014 0.86 mg/dL Final    Comment:    0.44-1.00 NOTE: New Reference Range  07/03/14    Creatinine, Ser  Date Value Ref Range Status  09/18/2021 0.75 0.57 - 1.00 mg/dL Final         Passed - Cl in normal range and within 180 days    Chloride  Date Value Ref Range Status  09/18/2021 101 96 - 106 mmol/L Final  08/05/2014 102 mmol/L Final    Comment:    101-111 NOTE: New Reference Range  07/03/14          Passed - Last BP in normal range    BP Readings from Last 1 Encounters:  09/18/21 138/68         Passed - Valid encounter within last 6 months    Recent Outpatient Visits          2 weeks ago Essential hypertension   [facility] [name], NP   3 months ago Essential hypertension   [facility] [name], NP   4 months ago Annual physical exam   [facility] [name], NP   9 months ago Essential hypertension   [employer], [name], DO   10 months ago 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 amLODipine (NORVASC) 5 MG tablet [Pharmacy Med Name: AMLODIPINE BESYLATE 5 MG TAB] 90 tablet 1    Sig:  TAKE 1 TABLET BY MOUTH ONCE DAILY     Cardiovascular: Calcium Channel Blockers 2 Passed - 10/02/2021 12:57 PM      Passed - Last BP in normal range    BP Readings from Last 1 Encounters:  09/18/21 138/68         Passed - Last Heart Rate in normal range    Pulse Readings from Last 1 Encounters:  09/18/21 71         Passed - Valid encounter within last 6 months    Recent Outpatient Visits          2 weeks ago Essential hypertension   [facility] [name], NP   3 months ago Essential hypertension   [facility] [name], NP   4 months ago Annual physical exam   [facility] [name], NP   9 months ago Essential hypertension   [place], [name], DO   10 months ago 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 meloxicam (MOBIC) 15  MG tablet [Pharmacy Med Name: MELOXICAM 15 MG TAB] 90 tablet 3    Sig: TAKE 1 TABLET BY MOUTH ONCE DAILY AS NEEDED WITH FOOD OR MILK     Analgesics:  COX2 Inhibitors Failed - 10/02/2021 12:57 PM      Failed - Manual Review: Labs are only required if the patient has taken medication for more than 8 weeks.      Passed - HGB in normal range and within 360 days    Hemoglobin  Date Value Ref Range Status  07/28/2021 13.6 12.0 - 15.0 g/dL Final  06/04/2021 13.1 11.1 - 15.9 g/dL Final         Passed - Cr in normal range and within 360 days    Creatinine  Date Value Ref Range Status  08/05/2014 0.86 mg/dL Final    Comment:    0.44-1.00 NOTE: New Reference Range  07/03/14    Creatinine, Ser  Date Value Ref Range Status  09/18/2021 0.75 0.57 - 1.00 mg/dL Final         Passed - HCT in normal range and within 360 days    HCT  Date Value Ref Range Status  07/28/2021 43.3 36.0 - 46.0 % Final   Hematocrit  Date Value Ref Range Status  06/04/2021 38.5 34.0 - 46.6 % Final         Passed - AST in normal range and within 360 days    AST  Date Value Ref Range Status  09/18/2021 15 0 - 40 IU/L Final   SGOT(AST)  Date Value Ref Range Status  08/05/2014 30 U/L Final    Comment:    15-41 NOTE: New Reference Range  07/03/14          Passed - ALT in normal range and within 360 days    ALT  Date Value Ref Range Status  09/18/2021 19 0 - 32 IU/L Final   SGPT (ALT)  Date Value Ref Range Status  08/05/2014 31 U/L Final    Comment:  14-54 NOTE: New Reference Range  07/03/14          Passed - eGFR is 30 or above and within 360 days    EGFR (African American)  Date Value Ref Range Status  08/05/2014 >60  Final   GFR calc Af Amer  Date Value Ref Range Status  04/03/2020 92 >59 mL/min/1.73 Final    Comment:    **In accordance with recommendations from the NKF-ASN Task force,**   [facility] is in the process of updating its eGFR calculation to the   2021 CKD-EPI creatinine  equation that estimates kidney function   without a race variable.    EGFR (Non-African Amer.)  Date Value Ref Range Status  08/05/2014 >60  Final    Comment:    eGFR values <10m/min/1.73 m2 may be an indication of chronic kidney disease (CKD). Calculated eGFR is useful in patients with stable renal function. The eGFR calculation will not be reliable in acutely ill patients when serum creatinine is changing rapidly. It is not useful in patients on dialysis. The eGFR calculation may not be applicable to patients at the low and high extremes of body sizes, pregnant women, and vegetarians.    GFR, Estimated  Date Value Ref Range Status  07/28/2021 >60 >60 mL/min Final    Comment:    (NOTE) Calculated using the CKD-EPI Creatinine Equation (2021)    eGFR  Date Value Ref Range Status  09/18/2021 94 >59 mL/min/1.73 Final         Passed - Patient is not pregnant      Passed - Valid encounter within last 12 months    Recent Outpatient Visits          2 weeks ago [facility]ential [facility]   [facility]   3 months [facility]   [facility]   4 months ago [facility] physical [facility]   [facility]   9 months ago Essential hypertension   [name]   10 months ago 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 Vitamin D, Ergocalciferol, (DRISDOL) 1.25 MG (50000 UNIT) CAPS capsule [Pharmacy Med Name: VITAMIN D (ERGOCALCIFEROL) 1.25 MG] 12 capsule 1    Sig: TAKE 1 CAPSULE BY MOUTH EVERY 7 DAYS     Endocrinology:   Vitamins - Vitamin D Supplementation 2 Failed - 10/02/2021 12:57 PM      Failed - Manual Review: Route requests for 50,000 IU strength to the provider      Passed - Ca in normal range and within 360 days    Calcium  Date Value Ref Range Status  09/18/2021 8.7 8.7 - 10.2 mg/dL Final   Calcium, Total  Date Value Ref Range Status  08/05/2014 8.2 (L) mg/dL Final    Comment:    8.9-10.3 NOTE: New Reference Range  07/03/14          Passed - Vitamin D in normal range and within 360 days    Vit D, 25-Hydroxy  Date Value Ref Range Status  09/18/2021 34.0 30.0 - 100.0 ng/mL Final    Comment:    Vitamin D deficiency has been defined by the [place] practice guideline as a level of serum 25-OH vitamin D less than 20 ng/mL (1,2). The Endocrine Society went on to further define vitamin D insufficiency as a level between 21 and 29 ng/mL (2). 1. IOM (Institute of Medicine). 2010. Dietary reference    intakes for calcium and D. [place]: The    [employer]. 2. Holick MF, Binkley , Bischoff-Ferrari HA, et al.    Evaluation, treatment, and prevention of vitamin D    deficiency: an Endocrine Society clinical practice    guideline. JCEM. 2011 Jul; 96(7):1911-30.          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   In 2 months Agbor-Etang, Aaron Edelman, MD [facility], [facility]           . TRULICITY 4.62 MM/3.8TR SOPN [Pharmacy Med Name: TRULICITY 7.11 AF/7.9UX SUBQ SOLN M] 6 mL 1    Sig: INJECT 0.75MG SUBQ ONCE A WEEK     Endocrinology:  Diabetes - GLP-1 Receptor Agonists Passed - 10/02/2021 12:57 PM      Passed - HBA1C is between 0 and 7.9 and within 180 days    HB A1C (BAYER DCA - WAIVED)  Date Value Ref Range Status  04/03/2020 5.5 <7.0 % Final    Comment:                                          Diabetic Adult            <7.0                                       Healthy Adult        4.3 - 5.7                                                           (DCCT/NGSP) American Diabetes Association's Summary of Glycemic Recommendations for Adults with Diabetes: Hemoglobin A1c <7.0%. More stringent glycemic goals (A1c <6.0%) may further reduce complications at the cost of increased risk of hypoglycemia.    Hgb A1c MFr Bld  Date Value Ref Range Status  09/18/2021 5.8 (H) 4.8 -  5.6 % Final    Comment:             Prediabetes: 5.7 - 6.4          Diabetes: >6.4          Glycemic control for adults with diabetes: <7.0          Passed - Valid encounter within last 6 months    Recent Outpatient Visits          2 weeks ago Essential hypertension   [facility] [name], NP   3 months ago Essential hypertension   [facility] [name], NP   4 months ago Annual physical exam   [facility] [name], NP   9 months ago Essential hypertension   [place], [name], DO   10 months ago 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 atorvastatin (LIPITOR) 80 MG tablet [Pharmacy Med Name: ATORVASTATIN CALCIUM 80 MG TAB] 90 tablet 3    Sig: TAKE 1 TABLET BY MOUTH ONCE EVERY EVENING     Cardiovascular:  Antilipid - Statins Failed - 10/02/2021 12:57 PM      Failed - Lipid Panel in normal range within the last 12 months    Cholesterol, Total  Date Value Ref Range Status  09/18/2021 130 100 - 199 mg/dL Final   LDL Chol Calc (NIH)  Date Value Ref Range Status  09/18/2021 78 0 - 99 mg/dL Final   HDL  Date Value Ref Range Status  09/18/2021 33 (L) >39 mg/dL Final   Triglycerides  Date Value Ref Range Status  09/18/2021 99 0 - 149 mg/dL Final         Passed - Patient is not pregnant      Passed - Valid encounter within last 12 months    Recent Outpatient Visits          2 weeks ago Essential hypertension   [place], Karen, NP   3 months ago Essential hypertension   [facility] [name], NP   4 months ago Annual physical exam   [facility] [name], NP   9 months ago Essential hypertension   [employer], [name], DO   10 months ago 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

## 2021-10-03 NOTE — Telephone Encounter (Signed)
Requested medications are due for refill today.  yes  Requested medications are on the active medications list.  yes  Last refill. 04/07/2021 #12 1 refill  Future visit scheduled.   yes  Notes to clinic.  Medication refill is not delegated.    Requested Prescriptions  Pending Prescriptions Disp Refills   Vitamin D, Ergocalciferol, (DRISDOL) 1.25 MG (50000 UNIT) CAPS capsule [Pharmacy Med Name: VITAMIN D (ERGOCALCIFEROL) 1.25 MG] 12 capsule 1    Sig: TAKE 1 CAPSULE BY MOUTH EVERY 7 DAYS     Endocrinology:  Vitamins - Vitamin D Supplementation 2 Failed - 10/02/2021 12:57 PM      Failed - Manual Review: Route requests for 50,000 IU strength to the provider      Passed - Ca in normal range and within 360 days    Calcium  Date Value Ref Range Status  09/18/2021 8.7 8.7 - 10.2 mg/dL Final   Calcium, Total  Date Value Ref Range Status  08/05/2014 8.2 (L) mg/dL Final    Comment:    8.9-10.3 NOTE: New Reference Range  07/03/14          Passed - Vitamin D in normal range and within 360 days    Vit D, 25-Hydroxy  Date Value Ref Range Status  09/18/2021 34.0 30.0 - 100.0 ng/mL Final    Comment:    Vitamin D deficiency has been defined by the Institute of Medicine and an Endocrine Society practice guideline as a level of serum 25-OH vitamin D less than 20 ng/mL (1,2). The Endocrine Society went on to further define vitamin D insufficiency as a level between 21 and 29 ng/mL (2). 1. IOM (Institute of Medicine). 2010. Dietary reference    intakes for calcium and D. [place]: The    [employer]. 2. Holick MF, Binkley Albers, Bischoff-Ferrari HA, et al.    Evaluation, treatment, and prevention of vitamin D    deficiency: an Endocrine Society clinical practice    guideline. JCEM. 2011 Jul; 96(7):1911-30.          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, limited to breakdown of skin [facility])   [facility] [name], NP       Future Appointments             In 2 weeks [name], NP [facility], [place]   In 2 months Agbor-Etang, Aaron Edelman, MD [facility], [facility]            Signed Prescriptions Disp Refills   furosemide (LASIX) 20 MG tablet 90 tablet 1    Sig: TAKE 1 TABLET BY MOUTH ONCE DAILY IN AFTERNOON AS NEEDED LEG EDEMA     Cardiovascular:  Diuretics - Loop Failed - 10/02/2021 12:57 PM      Failed - Mg Level in normal range and within 180 days    No results found for: "MG"       Passed -  K in normal range and within 180 days    Potassium  Date Value Ref Range Status  09/18/2021 4.5 3.5 - 5.2 mmol/L Final  08/05/2014 4.5 mmol/L Final    Comment:    3.5-5.1 NOTE: New Reference Range  07/03/14          Passed - Ca in normal range and within 180 days    Calcium  Date Value Ref Range Status  09/18/2021 8.7 8.7 - 10.2 mg/dL Final   Calcium, Total  Date Value Ref Range Status  08/05/2014 8.2 (L) mg/dL Final    Comment:    8.9-10.3 NOTE: New Reference Range  07/03/14          Passed - Na in normal range and within 180 days    Sodium  Date Value Ref Range Status  09/18/2021 138 134 - 144 mmol/L Final  08/05/2014 135 mmol/L Final    Comment:    135-145 NOTE: New Reference Range  07/03/14          Passed - Cr in normal range and within 180 days    Creatinine  Date Value Ref Range Status  08/05/2014 0.86 mg/dL Final    Comment:    0.44-1.00 NOTE: New Reference Range   07/03/14    Creatinine, Ser  Date Value Ref Range Status  09/18/2021 0.75 0.57 - 1.00 mg/dL Final         Passed - Cl in normal range and within 180 days    Chloride  Date Value Ref Range Status  09/18/2021 101 96 - 106 mmol/L Final  08/05/2014 102 mmol/L Final    Comment:    101-111 NOTE: New Reference Range  07/03/14          Passed - Last BP in normal range    BP Readings from Last 1 Encounters:  09/18/21 138/68         Passed - Valid encounter within last 6 months    Recent Outpatient Visits           2 weeks ago Essential hypertension   [facility] [name], NP   3 months ago Essential hypertension   [facility] [name], NP   4 months ago Annual physical exam   [facility] [name], NP   9 months ago Essential hypertension   [employer], [name], DO   10 months ago 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, limited to breakdown of skin [facility])   [name], NP       Future Appointments             In 2 weeks [name], NP [facility], [place]   In 2 months Agbor-Etang, Aaron Edelman, MD [place], [facility]             amLODipine (NORVASC) 5 MG tablet 90 tablet 1    Sig: TAKE 1 TABLET BY MOUTH ONCE DAILY     Cardiovascular: Calcium Channel Blockers 2 Passed - 10/02/2021 12:57 PM      Passed - Last BP in normal range    BP Readings from Last 1 Encounters:  09/18/21 138/68         Passed - Last Heart Rate in normal range    Pulse Readings from Last 1 Encounters:  09/18/21 71         Passed - Valid encounter within last 6 months  Recent Outpatient Visits           2 weeks ago Essential hypertension   [facility] [name], NP   3 months ago Essential hypertension   [facility] [name], NP   4 months ago  Annual physical exam   [facility] [name], NP   9 months ago Essential hypertension   [facility] [place], [name], DO   10 months ago 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, limited to breakdown of skin [facility])   [facility] [name], NP       Future Appointments             In 2 weeks [name], NP [facility], [place]   In 2 months Agbor-Etang, Aaron Edelman, MD [place], [facility]             meloxicam (MOBIC) 15 MG tablet 90 tablet 3    Sig: TAKE 1 TABLET BY MOUTH ONCE DAILY AS NEEDED WITH FOOD OR MILK     Analgesics:  COX2 Inhibitors Failed - 10/02/2021 12:57 PM      Failed - Manual Review: Labs are only required if the patient has taken medication for more than 8 weeks.      Passed - HGB in normal range and within 360 days    Hemoglobin  Date Value Ref Range Status  07/28/2021 13.6 12.0 - 15.0 g/dL Final  06/04/2021 13.1 11.1 - 15.9 g/dL Final         Passed - Cr in normal range and within 360 days    Creatinine  Date Value Ref Range Status  08/05/2014 0.86 mg/dL Final    Comment:    0.44-1.00 NOTE: New Reference Range  07/03/14    Creatinine, Ser  Date Value Ref Range Status  09/18/2021 0.75 0.57 - 1.00 mg/dL Final         Passed - HCT in normal range and within 360 days    HCT  Date Value Ref Range Status  07/28/2021 43.3 36.0 - 46.0 % Final   Hematocrit  Date Value Ref Range Status  06/04/2021 38.5 34.0 - 46.6 % Final         Passed - AST in normal range and within 360 days    AST  Date Value Ref Range Status  09/18/2021 15 0 - 40 IU/L Final   SGOT(AST)  Date Value Ref Range Status  08/05/2014 30 U/L Final    Comment:    15-41 NOTE: New Reference Range  07/03/14          Passed - ALT in normal range and within 360 days    ALT  Date Value Ref Range Status  09/18/2021 19 0 - 32 IU/L Final   SGPT  (ALT)  Date Value Ref Range Status  08/05/2014 31 U/L Final    Comment:    14-54 NOTE: New Reference Range  07/03/14          Passed - eGFR is 30 or above and within 360 days    EGFR (African American)  Date Value Ref Range Status  08/05/2014 >60  Final   GFR calc Af Amer  Date Value Ref Range Status  04/03/2020 92 >59 mL/min/1.73 Final    Comment:    **In accordance with recommendations from the NKF-ASN Task force,**   [facility] is in the process of updating its eGFR calculation to the  2021 CKD-EPI creatinine equation that estimates kidney function   without a race variable.    EGFR (Non-African Amer.)  Date Value Ref Range Status  08/05/2014 >60  Final    Comment:    eGFR values <47m/min/1.73 m2 may be an indication of chronic kidney disease (CKD). Calculated eGFR is useful in patients with stable renal function. The eGFR calculation will not be reliable in acutely ill patients when serum creatinine is changing rapidly. It is not useful in patients on dialysis. The eGFR calculation may not be applicable to patients at the low and high extremes of body sizes, pregnant women, and vegetarians.    GFR, Estimated  Date Value Ref Range Status  07/28/2021 >60 >60 mL/min Final    Comment:    (NOTE) Calculated using the CKD-EPI Creatinine Equation (2021)    eGFR  Date Value Ref Range Status  09/18/2021 94 >59 mL/min/1.73 Final         Passed - Patient is not pregnant      Passed - Valid encounter within last 12 months    Recent Outpatient Visits           2 weeks ago Essential hypertension   [place]   3 months ago Essential hypertension   [facility]   4 months ago Annual physical exam   [facility]   9 months ago Essential hypertension   [employer] [name], DO   10 months ago Diabetic ulcer of right heel associated with  diabetes mellitus due to underlying condition, limited to breakdown of skin ([facility]   [facility]       Future Appointments             In 2 weeks [facility]   In 2 months Agbor-Etang, BAaron Edelman MD [facility]             TRULICITY 03.49MZP/9.1TASOPN 6 mL 1    Sig: ILecompte    Endocrinology:  Diabetes - GLP-1 Receptor Agonists Passed - 10/02/2021 12:57 PM      Passed - HBA1C is between 0 and 7.9 and within 180 days    HB A1C (BAYER DCA - WAIVED)  Date Value Ref Range Status  04/03/2020 5.5 <7.0 % Final    Comment:                                          Diabetic Adult            <7.0                                       Healthy Adult        4.3 - 5.7                                                           (DCCT/NGSP) American Diabetes Association's Summary of Glycemic Recommendations for Adults with Diabetes: Hemoglobin A1c <7.0%. More  stringent glycemic goals (A1c <6.0%) may further reduce complications at the cost of increased risk of hypoglycemia.    Hgb A1c MFr Bld  Date Value Ref Range Status  09/18/2021 5.8 (H) 4.8 - 5.6 % Final    Comment:             Prediabetes: 5.7 - 6.4          Diabetes: >6.4          Glycemic control for adults with diabetes: <7.0          Passed - Valid encounter within last 6 months    Recent Outpatient Visits           2 weeks ago Essential hypertension   [place], Karen, NP   3 months ago Essential hypertension   [facility] [name], NP   4 months ago Annual physical exam   [facility] [name], NP   9 months ago Essential hypertension   [employer], [name], DO   10 months ago 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, limited to breakdown of skin ([place])   [name], NP       Future Appointments             In 2 weeks [name], NP [facility], [place]   In 2 months Agbor-Etang, Aaron Edelman, MD [facility], [facility]             atorvastatin (LIPITOR) 80 MG tablet 90 tablet 3    Sig: TAKE 1 TABLET BY MOUTH ONCE EVERY EVENING     Cardiovascular:  Antilipid - Statins Failed - 10/02/2021 12:57 PM      Failed - Lipid Panel in normal range within the last 12 months    Cholesterol, Total  Date Value Ref Range Status  09/18/2021 130 100 - 199 mg/dL Final   LDL Chol Calc (NIH)  Date Value Ref Range Status  09/18/2021 78 0 - 99 mg/dL Final   HDL  Date Value Ref Range Status  09/18/2021 33 (L) >39 mg/dL Final   Triglycerides  Date Value Ref Range Status  09/18/2021 99 0 - 149 mg/dL Final         Passed - Patient is not pregnant      Passed - Valid encounter within last 12 months    Recent Outpatient Visits           2 weeks ago Essential hypertension   [name][facility] NP   3 months ago Essential hypertension   [facility] [name], NP   4 months ago Annual physical exam   [facility] [name], NP   9 months ago Essential hypertension   [employer], [name], DO   10 months ago 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

## 2021-10-06 ENCOUNTER — Other Ambulatory Visit: Payer: Self-pay | Admitting: Pharmacist

## 2021-10-06 DIAGNOSIS — J449 Chronic obstructive pulmonary disease, unspecified: Secondary | ICD-10-CM

## 2021-10-06 DIAGNOSIS — I10 Essential (primary) hypertension: Secondary | ICD-10-CM

## 2021-10-06 NOTE — Patient Instructions (Addendum)
[employer],   Call the customer service number on your card to see what eye doctors are in network, but Jon Gills previously told you that [facility] was in network.   Keep up the great work taking your medications.   You will be due for your second Shingles vaccine after July 25th. Make sure you plan to schedule follow up with Ms. Holdsworth then.   Take care!  Catie Eppie Gibson, PharmD, [facility] [phone] It was great to speak with you today!  Visit Information  Ms. Poliquin was given information about Medicaid Managed Care team care coordination services as a part of their [facility] Medicaid benefit. Fawne Teleah Villamar verbally consented to engagement with the [facility] Managed Care team.   If you are experiencing a medical emergency, please call 911 or report to your local emergency department or urgent care.   If you have a non-emergency medical problem during routine business hours, please contact your provider's office and ask to speak with a nurse.   For questions related to your [facility] health plan, please call: [phone] or go here:[email or web address]  If you would like to schedule transportation through your [facility] plan, please call the following number at least 2 days in advance of your appointment: [phone].  You can also use the MTM portal or MTM mobile app to manage your rides. For the portal, please go to [email or web address]

## 2021-10-06 NOTE — Chronic Care Management (AMB) (Signed)
Chief Complaint  Patient presents with   High Risk Managed Medicaid    Cheryl Chandler is a 56 y.o. year old female who presented for a telephone visit.   They were referred to the pharmacist by their PCP for assistance in managing complex medication management.   Patient is participating in a Managed Medicaid Plan:  Yes  Subjective:  Care Team: Primary Care Provider: [name], NP ; Next Scheduled Visit: 10/22/21 Cardiologist: Garen Lah; Next Scheduled Visit: 12/02/21 Pulmonary: Patsey Berthold; Next Scheduled Visit 9/12/2  Medication Access/Adherence  Current Pharmacy:  [facility], [place] [address] Phone: [phone] Fax: [phone]   Patient reports affordability concerns with their medications: No  Patient reports access/transportation concerns to their pharmacy: No  Patient reports adherence concerns with their medications:  No     Hypertension:  Current medications: amlodipine 5 mg daily, hydralazine 25 mg three times daily, lisinopril 40 mg daily  Patient has a validated, automated, upper arm home BP cuff Current blood pressure readings readings: 125-150s/50-60s with HR 70-80s; reports variability based upon stress levels  Patient denies hypotensive s/sx including dizziness, lightheadedness.  Patient denies hypertensive symptoms including headache, chest pain, shortness of breath  Current meal patterns; has been working on reducing salt in her diet. Discussed focus on lean proteins, vegetables.   Hyperlipidemia/ASCVD Risk Reduction  Current lipid lowering medications: atorvastatin 80 mg daily  COPD: Current medications: Dulera 100/5 mcg 2 puffs twice daily, Spiriva 18 mcg daily; albuterol HFA PRN  Referral to pulmonary in place for SOB, as well as referral to cardiology to evaluate SOB  Health Maintenance  Health Maintenance Due  Topic Date Due   PAP SMEAR-Modifier  Never done    MAMMOGRAM  Never done   COVID-19 Vaccine (4 - Booster for Moderna series) 03/18/2020     Objective: Lab Results  Component Value Date   HGBA1C 5.8 (H) 09/18/2021    Lab Results  Component Value Date   CREATININE 0.75 09/18/2021   BUN 14 09/18/2021   NA 138 09/18/2021   K 4.5 09/18/2021   CL 101 09/18/2021   CO2 25 09/18/2021    Lab Results  Component Value Date   CHOL 130 09/18/2021   HDL 33 (L) 09/18/2021   LDLCALC 78 09/18/2021   TRIG 99 09/18/2021   CHOLHDL 3.9 09/18/2021    Medications Reviewed Today     Reviewed by [name], RN (Registered Nurse) on 10/02/21 at [place] List Status: <None>   Medication Order Taking? Sig Documenting Provider Last Dose Status Informant  ACCU-CHEK GUIDE test strip [id number] No USE TO CHECK BLOOD SUGAR 3 TIMES DAILY AS DIRECTED [name], NP Taking Active   Accu-Chek Softclix Lancets lancets [id number] No CHECK BLOOD SUGAR TWICE DAILY [name], NP Taking Active   albuterol (PROVENTIL) (2.5 MG/3ML) 0.083% nebulizer solution [id number] No INHALE CONTENTS OF 1 VIAL IN NEBULIZER EVERY 4 HOURS AS NEEDED [name], NP Taking Active   amLODipine (NORVASC) 5 MG tablet [id number] No TAKE 1 TABLET BY MOUTH ONCE DAILY [name], NP Taking Active   atorvastatin (LIPITOR) 80 MG tablet [id number] No Take 1 tablet (80 mg total) by mouth daily. [name], NP Taking Active   Blood Glucose Monitoring Suppl (ACCU-CHEK GUIDE) w/Device KIT [id number] No Use to check blood sugar 3 times a day. [name], NP Taking Active   Blood Pressure Monitoring (OMRON 3 SERIES  BP MONITOR) DEVI [id number] No Use to check blood pressure as directed [name], NP Taking Active   citalopram (CELEXA) 40 MG tablet [id number] No Take 1 tablet (40 mg total) by mouth daily. [name], NP Taking Active   EPINEPHrine 0.3 mg/0.3 mL IJ SOAJ injection [id number] No USE AS DIRECTED. [name], NP Taking Active    furosemide (LASIX) 20 MG tablet [id number] No TAKE 1 TABLET BY MOUTH ONCE DAILY IN AFTERNOON AS NEEDED LEG EDEMA [name], NP Taking Active   gabapentin (NEURONTIN) 300 MG capsule [id number] No TAKE 1 CAPSULE BY MOUTH 3 TIMES DAILY ASNEEDED (NEED OFFICE VISIT FOR FURTHER REFILLS) [name], NP Taking Active   hydrALAZINE (APRESOLINE) 25 MG tablet [id number] No Take 1 tablet (25 mg total) by mouth 3 (three) times daily. [name], NP Taking Active   levothyroxine (SYNTHROID) 50 MCG tablet [id number] No TAKE 1 TABLET BY MOUTH ONCE DAILY ON AN EMPTY STOMACH. WAIT 30 MINUTES BEFORE TAKING OTHER MEDS. [name], NP Taking Active   lisinopril (ZESTRIL) 40 MG tablet [id number] No TAKE 1 TABLET BY MOUTH ONCE DAILY [name], NP Taking Active   meloxicam (MOBIC) 15 MG tablet [id number] No TAKE 1 TABLET BY MOUTH ONCE DAILY AS NEEDED WITH FOOD OR MILK [name], NP Taking Active   methocarbamol (ROBAXIN) 500 MG tablet [id number] No Take 500 mg by mouth every 8 (eight) hours as needed for muscle spasms. [provider] Taking Active   mometasone-formoterol (DULERA) 100-5 MCG/ACT AERO [id number]  INHALE 2 PUFFS BY MOUTH TWICE A DAY. RINSE MOUTH AFTER USE. [name], NP  Active   montelukast (SINGULAIR) 10 MG tablet [id number] No TAKE 1 TABLET BY MOUTH AT BEDTIME [name], NP Taking Active   nitroGLYCERIN (NITROSTAT) 0.4 MG SL tablet [id number] No DISSOLVE 1 TABLET UNDER TONGUE AT ONSET OF CHEST PAIN. REPEAT IN 5 MIN IF NOT RESOLVED, MAX 3 DOSES. 911 IF NEEDED. [name], NP Taking Active   omeprazole (PRILOSEC) 20 MG capsule [id number] No Take 1 capsule (20 mg total) by mouth daily. [name], NP Taking Active   SPIRIVA HANDIHALER 18 MCG inhalation capsule [id number] No INHALE CONTENTS OF 1 CAPSULE ONCE DAILY [name], NP Taking Active   triamcinolone cream (KENALOG) 0.1 % [id number] No APPLY 1 APPLICATION TOPICALLY TWICE DAILY  [name], [name], NP Taking Active   triamcinolone cream (KENALOG) 0.1 % [id number]  Apply 1 application. topically 2 (two) times daily. [name], NP  Active   TRULICITY 8.25 KN/3.9JQ [name] [id number] No INJECT 0.75MG SUBQ ONCE A [name], NP Taking Active   Vitamin D, Ergocalciferol, (DRISDOL) 1.25 MG (50000 UNIT) CAPS capsule [id number] No TAKE 1 CAPSULE BY MOUTH EVERY 7 DAYS [name], NP Taking Active             [name]    Flowsheet Row Most Recent Value  [name]   [name] for the Following Domains Stress  Stress Interventions Other (Comment)  [encouraged continued collaboration with LCS]      Assessment/Plan:   Hypertension: - Currently improved. Discussed continued focus on stress management and medication adherence.  - Encouraged to keep follow up with pulmonary and cardiology to evaluate SOB and potential for therapy optimization pending ECHO results.  - Recommended to continue current regimen at this time.   Hyperlipidemia/ASCVD Risk Reduction: - Currently moderately well controlled - Recommend to continue current regimen at this time.   COPD: - Currently uncontrolled.  - Recommend to continue current regimen  and keep follow up with cardiology and pulmonary.   Health Maintenance - Praised for receiving first Shingrix vaccine and Tdap vaccine. Reviewed need for second Shingrix vaccine. - Reviewed to call her insurance to confirm what eye doctors are in network  Follow Up Plan: phone call in 8 weeks  Catie Hedwig Morton, PharmD, [facility] [phone]

## 2021-10-08 ENCOUNTER — Other Ambulatory Visit: Payer: Self-pay

## 2021-10-08 NOTE — Patient Instructions (Signed)
Visit Information  Ms. Medeiros was given information about Medicaid Managed Care team care coordination services as a part of their [facility] Medicaid benefit. Malessa Honestee Coppler verbally consented to engagement with the [facility] Managed Care team.   If you are experiencing a medical emergency, please call 911 or report to your local emergency department or urgent care.   If you have a non-emergency medical problem during routine business hours, please contact your provider's office and ask to speak with a nurse.   For questions related to your [facility] health plan, please call: [phone] or go here:[email or web address]  If you would like to schedule transportation through your [facility] plan, please call the following number at least 2 days in advance of your appointment: [phone].  You can also use the MTM portal or MTM mobile app to manage your rides. For the portal, please go to [email or web address].  Call the [place] at [phone], at any time, 24 hours a day, 7 days a week. If you are in danger or need immediate medical attention call 911.  If you would like help to quit smoking, call 1-800-QUIT-NOW (785)664-3983) OR Espaol: 1-855-Djelo-Ya HD:1601594) o para ms informacin haga clic aqu or Text READY to 200-400 to register via text  [name] - following are the goals we discussed in your visit today:   Goals Addressed   None      Social Worker will follow up in 30 days.   Mickel Fuchs, BSW, [place] Managed Medicaid Team  [phone]   Following is a copy of your plan of care:  There are no care plans that you recently modified to display for this patient.

## 2021-10-08 NOTE — Patient Outreach (Signed)
Medicaid Managed Care Social Work Note  10/08/2021 Name:  Cheryl Chandler MRN:  [id number] DOB:  1965/10/13  Cheryl Chandler is an 56 y.o. year old female who is a primary patient of Cheryl Billings, NP.  The Medicaid Managed Care Coordination team was consulted for assistance with:  Community Resources   [name] was given information about Medicaid Managed Care Coordination team services today. [name] agreed to services and verbal consent obtained.  Engaged with patient  for by telephone forfollow up visit in response to referral for case management and/or care coordination services.   Assessments/Interventions:  Review of past medical history, allergies, medications, health status, including review of consultants reports, laboratory and other test data, was performed as part of comprehensive evaluation and provision of chronic care management services.  SDOH: (Social Determinant of Health) assessments and interventions performed: BSW completed telephone outreach with patient. She stated she is doing okay, her husband had cataract surgery and now has an infection. Patient stated they are still struggling with finances, and are still using the resources provided. Patient stated she is still trying to get an eye exam but has not contacted the eye resources BSW provided. Patient stated she would give them a call. No other resources are needed at this time.   Advanced Directives Status:  Not addressed in this encounter.  Care Plan                 Allergies  Allergen Reactions   Codeine Shortness Of Breath and Rash   Percocet [Oxycodone-Acetaminophen] Shortness Of Breath   Sulfa Antibiotics Shortness Of Breath and Rash   Aspirin Hives   Bee Venom     Bee stings   Darvon [Propoxyphene]     Darvocet-N 100   Latex    Oxycodone Nausea And Vomiting   Penicillins     Has patient had a PCN reaction causing immediate rash, facial/tongue/throat swelling,  SOB or lightheadedness with hypotension: Unknown Has patient had a PCN reaction causing severe rash involving mucus membranes or skin necrosis: Unknown Has patient had a PCN reaction that required hospitalization: Unknown Has patient had a PCN reaction occurring within the last 10 years: Unknown If all of the above answers are "NO", then may proceed with Cephalosporin use.    Medications Reviewed Today     Reviewed by [name], RN (Registered Nurse) on 10/02/21 at 1327  Med List Status: <None>   Medication Order Taking? Sig Documenting Provider Last Dose Status Informant  ACCU-CHEK GUIDE test strip [id number] No USE TO CHECK BLOOD SUGAR 3 TIMES DAILY AS DIRECTED Cheryl Billings, NP Taking Active   Accu-Chek Softclix Lancets lancets [id number] No CHECK BLOOD SUGAR TWICE DAILY Cheryl Billings, NP Taking Active   albuterol (PROVENTIL) (2.5 MG/3ML) 0.083% nebulizer solution [id number] No INHALE CONTENTS OF 1 VIAL IN NEBULIZER EVERY 4 HOURS AS NEEDED Cheryl Billings, NP Taking Active   amLODipine (NORVASC) 5 MG tablet [id number] No TAKE 1 TABLET BY MOUTH ONCE DAILY Cheryl Billings, NP Taking Active   atorvastatin (LIPITOR) 80 MG tablet [id number] No Take 1 tablet (80 mg total) by mouth daily. Cheryl Billings, NP Taking Active   Blood Glucose Monitoring Suppl (ACCU-CHEK GUIDE) w/Device KIT [id number] No Use to check blood sugar 3 times a day. [name], NP Taking Active   Blood Pressure Monitoring (OMRON 3 SERIES BP MONITOR) DEVI [id number] No Use to check blood pressure as directed Cheryl Billings, NP Taking Active   citalopram (CELEXA) 40  MG tablet [id number] No Take 1 tablet (40 mg total) by mouth daily. Cheryl Billings, NP Taking Active   EPINEPHrine 0.3 mg/0.3 mL IJ SOAJ injection [id number] No USE AS DIRECTED. Cheryl Billings, NP Taking Active   furosemide (LASIX) 20 MG tablet [id number] No TAKE 1 TABLET BY MOUTH ONCE DAILY IN AFTERNOON AS NEEDED LEG EDEMA Cheryl Billings,  NP Taking Active   gabapentin (NEURONTIN) 300 MG capsule [id number] No TAKE 1 CAPSULE BY MOUTH 3 TIMES DAILY ASNEEDED (NEED OFFICE VISIT FOR FURTHER REFILLS) Cheryl Billings, NP Taking Active   hydrALAZINE (APRESOLINE) 25 MG tablet [id number] No Take 1 tablet (25 mg total) by mouth 3 (three) times daily. Cheryl Billings, NP Taking Active   levothyroxine (SYNTHROID) 50 MCG tablet [id number] No TAKE 1 TABLET BY MOUTH ONCE DAILY ON AN EMPTY STOMACH. WAIT 30 MINUTES BEFORE TAKING OTHER MEDS. Cheryl Billings, NP Taking Active   lisinopril (ZESTRIL) 40 MG tablet [id number] No TAKE 1 TABLET BY MOUTH ONCE DAILY Cheryl Billings, NP Taking Active   meloxicam (MOBIC) 15 MG tablet [id number] No TAKE 1 TABLET BY MOUTH ONCE DAILY AS NEEDED WITH FOOD OR MILK Cheryl Billings, NP Taking Active   methocarbamol (ROBAXIN) 500 MG tablet [id number] No Take 500 mg by mouth every 8 (eight) hours as needed for muscle spasms. [provider] Taking Active   mometasone-formoterol (DULERA) 100-5 MCG/ACT AERO [id number]  INHALE 2 PUFFS BY MOUTH TWICE A DAY. RINSE MOUTH AFTER USE. Cheryl Billings, NP  Active   montelukast (SINGULAIR) 10 MG tablet [id number] No TAKE 1 TABLET BY MOUTH AT BEDTIME Cheryl Billings, NP Taking Active   nitroGLYCERIN (NITROSTAT) 0.4 MG SL tablet [id number] No DISSOLVE 1 TABLET UNDER TONGUE AT ONSET OF CHEST PAIN. REPEAT IN 5 MIN IF NOT RESOLVED, MAX 3 DOSES. 911 IF NEEDED. Cheryl Billings, NP Taking Active   omeprazole (PRILOSEC) 20 MG capsule [id number] No Take 1 capsule (20 mg total) by mouth daily. Cheryl Billings, NP Taking Active   SPIRIVA HANDIHALER 18 MCG inhalation capsule [id number] No INHALE CONTENTS OF 1 CAPSULE ONCE DAILY Cheryl Billings, NP Taking Active   triamcinolone cream (KENALOG) 0.1 % [id number] No APPLY 1 APPLICATION TOPICALLY TWICE DAILY [name], [name], NP Taking Active   triamcinolone cream (KENALOG) 0.1 % [id number]  Apply 1 application. topically 2 (two) times  daily. Cheryl Billings, NP  Active   TRULICITY 3.49 ZP/9.1TA [name] [id number] No INJECT 0.75MG SUBQ ONCE A [name], NP Taking Active   Vitamin D, Ergocalciferol, (DRISDOL) 1.25 MG (50000 UNIT) CAPS capsule [id number] No TAKE 1 CAPSULE BY MOUTH EVERY 7 DAYS Cheryl Billings, NP Taking Active             Patient Active Problem List   Diagnosis Date Noted   Foot pain, right 09/01/2021   Fibrocystic disease of both breasts 05/07/2019   IFG (impaired fasting glucose) 05/03/2019   Chronic venous insufficiency 11/09/2018   Overactive bladder 09/30/2018   Peripheral neuropathy 09/30/2018   Apnea 01/02/2016   Lymphedema 11/30/2014   Dyspnea on exertion 11/30/2014   Anxiety 11/29/2014   Arthritis 11/29/2014   COPD (chronic obstructive pulmonary disease) ([place]) 11/29/2014   Clinical depression 11/29/2014   H/O eating disorder 11/29/2014   Esophageal reflux 11/29/2014   Acne inversa 11/29/2014   Essential hypertension 11/29/2014   HLD (hyperlipidemia) 11/29/2014   Adult hypothyroidism 11/29/2014   Insomnia 11/29/2014   Low back pain 11/29/2014   Morbid obesity ([place]) 11/29/2014   Posttraumatic stress disorder 11/29/2014   Vitamin D deficiency  11/29/2014   Nicotine dependence, cigarettes, uncomplicated 61/16/4353    Conditions to be addressed/monitored per PCP order:   community resources  There are no care plans that you recently modified to display for this patient.   Follow up:  Patient agrees to Care Plan and Follow-up.  Plan: The Managed Medicaid care management team will reach out to the patient again over the next 30 days.  Date/time of next scheduled Social Work care management/care coordination outreach:  11/07/21  Mickel Fuchs, Arita Miss, [place] Medicaid Team  [phone]

## 2021-10-10 DIAGNOSIS — R3981 Functional urinary incontinence: Secondary | ICD-10-CM | POA: Diagnosis not present

## 2021-10-10 DIAGNOSIS — Z993 Dependence on wheelchair: Secondary | ICD-10-CM | POA: Diagnosis not present

## 2021-10-13 ENCOUNTER — Other Ambulatory Visit: Payer: Self-pay | Admitting: Licensed Clinical Social Worker

## 2021-10-13 NOTE — Patient Outreach (Signed)
Medicaid Managed Care Social Work Note  10/13/2021 Name:  Cheryl Chandler MRN:  [id number] DOB:  Mar 06, 1966  Cheryl Chandler is an 56 y.o. year old female who is a primary patient of [name], NP.  The Medicaid Managed Care Coordination team was consulted for assistance with:  [place] and Resources  Ms. Dematteo was given information about Medicaid Managed Care Coordination team services today. [place] Patient agreed to services and verbal consent obtained.  Engaged with patient  for by telephone forfollow up visit in response to referral for case management and/or care coordination services.   Assessments/Interventions:  Review of past medical history, allergies, medications, health status, including review of consultants reports, laboratory and other test data, was performed as part of comprehensive evaluation and provision of chronic care management services.  SDOH: (Social Determinant of Health) assessments and interventions performed: [name]    Flowsheet Row Most Recent Value  [name]   Stress Interventions Offered [employer], Provide Counseling       Advanced Directives Status:  See Care Plan for related entries.  Care Plan                 Allergies  Allergen Reactions   Codeine Shortness Of Breath and Rash   Percocet [Oxycodone-Acetaminophen] Shortness Of Breath   Sulfa Antibiotics Shortness Of Breath and Rash   Aspirin Hives   Bee Venom     Bee stings   Darvon [Propoxyphene]     Darvocet-N 100   Latex    Oxycodone Nausea And Vomiting   Penicillins     Has patient had a PCN reaction causing immediate rash, facial/tongue/throat swelling, SOB or lightheadedness with hypotension: Unknown Has patient had a PCN reaction causing severe rash involving mucus membranes or skin necrosis: Unknown Has patient had a PCN reaction that required hospitalization: Unknown Has patient had a PCN  reaction occurring within the last 10 years: Unknown If all of the above answers are "NO", then may proceed with Cephalosporin use.    Medications Reviewed Today     Reviewed by [name], LCSW (Social Worker) on 10/13/21 at [place] List Status: <None>   Medication Order Taking? Sig Documenting Provider Last Dose Status Informant  ACCU-CHEK GUIDE test strip [id number] No USE TO CHECK BLOOD SUGAR 3 TIMES DAILY AS DIRECTED [name], NP Taking Active   Accu-Chek Softclix Lancets lancets [id number] No CHECK BLOOD SUGAR TWICE DAILY [name], NP Taking Active   albuterol (PROVENTIL) (2.5 MG/3ML) 0.083% nebulizer solution [id number] No INHALE CONTENTS OF 1 VIAL IN NEBULIZER EVERY 4 HOURS AS NEEDED [name], NP Taking Active   amLODipine (NORVASC) 5 MG tablet [id number]  TAKE 1 TABLET BY MOUTH ONCE DAILY [name], NP  Active   atorvastatin (LIPITOR) 80 MG tablet [id number]  TAKE 1 TABLET BY MOUTH ONCE EVERY [name], NP  Active   Blood Glucose Monitoring Suppl (ACCU-CHEK GUIDE) w/Device KIT [id number] No Use to check blood sugar 3 times a day. [name], NP Taking Active   Blood Pressure Monitoring (OMRON 3 SERIES BP MONITOR) DEVI [id number] No Use to check blood pressure as directed [name], NP Taking Active   citalopram (CELEXA) 40 MG tablet [id number] No Take 1 tablet (40 mg total) by mouth daily. [name], NP Taking Active   EPINEPHrine 0.3 mg/0.3 mL IJ SOAJ injection [id number] No USE AS DIRECTED. [name], NP Taking Active   furosemide (LASIX) 20 MG tablet  [id number]  TAKE 1 TABLET BY MOUTH ONCE DAILY IN AFTERNOON AS NEEDED LEG EDEMA [name], NP  Active   gabapentin (NEURONTIN) 300 MG capsule [id number] No TAKE 1 CAPSULE BY MOUTH 3 TIMES DAILY ASNEEDED (NEED OFFICE VISIT FOR FURTHER REFILLS) [name], NP Taking Active   hydrALAZINE (APRESOLINE) 25 MG tablet [id number] No Take 1 tablet (25 mg  total) by mouth 3 (three) times daily. [name], NP Taking Active   levothyroxine (SYNTHROID) 50 MCG tablet [id number] No TAKE 1 TABLET BY MOUTH ONCE DAILY ON AN EMPTY STOMACH. WAIT 30 MINUTES BEFORE TAKING OTHER MEDS. [name], NP Taking Active   lisinopril (ZESTRIL) 40 MG tablet [id number] No TAKE 1 TABLET BY MOUTH ONCE DAILY [name], NP Taking Active   meloxicam (MOBIC) 15 MG tablet [id number]  TAKE 1 TABLET BY MOUTH ONCE DAILY AS NEEDED WITH FOOD OR MILK [name], NP  Active   methocarbamol (ROBAXIN) 500 MG tablet [id number] No Take 500 mg by mouth every 8 (eight) hours as needed for muscle spasms. [provider] Taking Active   mometasone-formoterol (DULERA) 100-5 MCG/ACT AERO [id number]  INHALE 2 PUFFS BY MOUTH TWICE A DAY. RINSE MOUTH AFTER USE. [name], NP  Active   montelukast (SINGULAIR) 10 MG tablet [id number] No TAKE 1 TABLET BY MOUTH AT BEDTIME [name], NP Taking Active   nitroGLYCERIN (NITROSTAT) 0.4 MG SL tablet [id number] No DISSOLVE 1 TABLET UNDER TONGUE AT ONSET OF CHEST PAIN. REPEAT IN 5 MIN IF NOT RESOLVED, MAX 3 DOSES. 911 IF NEEDED. [name], NP Taking Active   omeprazole (PRILOSEC) 20 MG capsule [id number] No Take 1 capsule (20 mg total) by mouth daily. [name], NP Taking Active   SPIRIVA HANDIHALER 18 MCG inhalation capsule [id number] No INHALE CONTENTS OF 1 CAPSULE ONCE DAILY [name], NP Taking Active   triamcinolone cream (KENALOG) 0.1 % [id number] No APPLY 1 APPLICATION TOPICALLY TWICE DAILY [name], [name], NP Taking Active   triamcinolone cream (KENALOG) 0.1 % [id number]  Apply 1 application. topically 2 (two) times daily. [name], NP  Active   TRULICITY 7.15 NB/3.9YD [name] [id number]  INJECT 0.75MG SUBQ ONCE A WEEK [name], NP  Active   Vitamin D, Ergocalciferol, (DRISDOL) 1.25 MG (50000 UNIT) CAPS capsule [id number]  TAKE 1 CAPSULE BY MOUTH EVERY 7 DAYS [name], NP  Active             Patient Active Problem List   Diagnosis Date Noted   Foot pain, right 09/01/2021   Fibrocystic disease of both breasts 05/07/2019   IFG (impaired fasting glucose) 05/03/2019   Chronic venous insufficiency 11/09/2018   Overactive bladder 09/30/2018   Peripheral neuropathy 09/30/2018   Apnea 01/02/2016   Lymphedema 11/30/2014   Dyspnea on exertion 11/30/2014   Anxiety 11/29/2014   Arthritis 11/29/2014   COPD (chronic obstructive pulmonary disease) ([place]) 11/29/2014   Clinical depression 11/29/2014   H/O eating disorder 11/29/2014   Esophageal reflux 11/29/2014   Acne inversa 11/29/2014   Essential hypertension 11/29/2014   HLD (hyperlipidemia) 11/29/2014   Adult hypothyroidism 11/29/2014   Insomnia 11/29/2014   Low back pain 11/29/2014   Morbid obesity ([place]) 11/29/2014   Posttraumatic stress disorder 11/29/2014   Vitamin D deficiency 11/29/2014   Nicotine dependence, cigarettes, uncomplicated 93/96/8864    Conditions to be addressed/monitored per PCP order:  Depression  Care Plan : General Social Work (Adult)  Updates made by [name], LCSW since 10/13/2021 12:00 AM  Problem: Coping Skills (General Plan of Care)      Long-Range Goal: Coping Skills Enhanced   Start Date: 11/07/2020  Expected End Date: 04/10/2021  Recent Progress: On track  Priority: High  Note:   Timeframe:  Long-Range Goal Priority:  High Start Date:       08/26/21                    Expected End Date:  ongoing  Follow up date: 11/17/21 at 1:00 pm.   Current Barriers:  Financial constraints Mental Health Concerns  Social Isolation Limited education about mental health support resources that are available to her within the local area* Cognitive Deficits Lacks knowledge of community resource  Clinical Social Work Clinical Goal(s):  Over the next 90 days, client will work with SW to address concerns related to experiencing ongoing symptoms of depression,  sadness and grief since the passing of her mother Over the next 90 days, patient will work with LCSW to address needs related to implementing appropriate self-care and depression management.   Interventions: Patient interviewed and appropriate assessments performed Provided mental health counseling with regards to patient's loss and ongoing stressors. Patient was very close with her mother who passed away a few years ago and she continues to experience symptoms of grief from this loss. Patient denies wanting LCSW to make a referral for grief counseling through [facility] again but was receptive to coping skill and resource education/information that was provided during session today. Patient reports decorating for the holidays helps her to connect with her mother as they did this together when she was alive.  Provided patient with information about managing depression within her daily life. Patient has ongoing grief symptoms as well. Patient states "I feel worn down." Patient reports that she talks out loud to her mother's picture and this is very helpful to her. Grief support resource education provided again during session today.  Provided reflective listening and implemented appropriate interventions to help suppport patient and her emotional needs  Discussed plans with patient for ongoing care management follow up and provided patient with direct contact information for [facility] team Advised patient to contact [facility] team with any urgent case management needs. Assisted patient/caregiver with obtaining information about health plan benefits LCSW completed past referral for family to have a wheelchair ramp built at their residence due to ongoing mobility concerns. Ramp has been successfully installed.  PCS actively involved with patient and providing ongoing services.  Positive reinforcement provided to patient for quitting smoking cigarettes. Coping skill review education provided for future triggers.   Brief self-care education provided to both patient and spouse. Patient confirms that she continues to go to food pantry for food insecurity. Resource information has been provided.  Patient reports ongoing pain and issues with mobility. Patient shares that her pain resides in her pains and legs. Emotional support and coping skill education provided. Patient confirms that her PCS aide continues to provide her with services. However, aide has to be reminded to put on her mask since patient is high risk. LCSW reviewed resources that [name] provided her with in the past.  Patient reports that she had a wonderful birthday spent with her family this past May. Patient reports receiving appropriate socialization. Patient reports that her spouse took her out to eat and gifted her with several things which was a nice surprise.  Patient confirms stable transportation arrangements for upcoming PCP appointment this week.  Patient repots that she is losing a lot  of weight but is not sure how much she has lost as she does not have a scale. Patient reports that a goal of hers is to lose weight but she has not been intentionally losing weight at this time.  Patient reports that her aide [name] continues to assist her with PCS and is an excellent support within the home for her and spouse.  Patient planted a tomato plant and successfully grew over 5 tomatoes over the summer. Patient is very proud of this achievement as this was her first time planting a tomato plant and is now interested in picking up gardening/planting as a hobby. Patient reports that her aunt continues to be a positive support for her in her life and someone that she can rely on in the case of emergencies.  Patient was educated on healthy self-care skills and advised her to use these into her daily routine.  04/10/21- Update- Patient is agreeable to social work closure at this time as she still does not need medication management or counseling as  she is managing her mental health well at this time. Patient talks to her younger sister everyday to gain emotional support. Patient says that her sister is into body building and helps to educate and remind her to do her own self-care. Patient is agreeable to contact [facility] LCSW directly if any social work concern arise. [facility] LCSW will complete case closure at this time.  Patient reports that she has been elevating her legs daily to elevate pain and swelling.  [facility] LCSW completed care coordination with Thomasene Lot on 01/15/21 and [facility] LCSW provided the following suggested resource-It is likely their best option given their loss of housing and lack of consistent income:  Publishing rights manager                                                                                                                      (Emergency [place]) [phone] Ext. 104 M-F, 8am - 4pm Patient was successfully approved for food stamps but is still experiencing financial concerns. She reports that she and her husband have no clothes that fit them and they are having to start using a string for their pants and patient only has one bra at this time. [facility] LCSW will send referral to [facility] BSW today for financial support.  BSW spoke with patient about resources needed. Patient states she does receive SSI and was approved for foodstamps. Patient is familiar with all of the food pantries and other resources in [place]. BSW will assist patient with finding an eye doctor. Patient states she has not been in 10 years. BSW will research and put a list of eye doctors together that accept Medicaid in [facility]. 04/30/20: BSW contacted and spoke with patient, she stated she was not doing good because she found out that she has a new landlord and they are going up on her rent. BSW informed patient to make sure she informs her social workers at [employer] that  they have an increase for foodstamps and Medicaid. Patient stated  that she sleeps on the couch and is in need of a power recliner/lift chair. BSW will send patients PCP a message asking for a script to be sent. 05/21/21: BSW completed follow up telephone call with patient. She states she is unsure if their new landlord has received their January rent. Patient states she is also worried due to the new landlord making them pay rent online and on the 1st of the month. Patient states she is unable to get anyone on the phone. Patient states her rent will go up to 400 from 255 in March. Patient states she does not have many clothes but goes to [employer] to get some Materials engineer. No other resources needed at this time.   06/20/21: BSW completed follow up telelphone call with patient. Patient states she is doing well. Patient states [employer] [place] will cover all of Vaccines. Patient states no resources are needed at this time. 08/26/21: BSW completed telephone outreach with patient, she stated since going to the hospital she has been feeling weak. Patient states other than that she is feeling okay. Patient stated she is going to get a new phone on Wednesday. Patient states her birthday is on Friday and her husband may take her to a [place]. No other resources are needed at this time.Update- Patient reports experiencing additional grief lately. [facility] LCSW offered referral for berievement counseling but she declined. UPDATE 08/26/21- Patient reports that she has a near death experience last month. Patient shares that she had a fall. 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 [facility] LCSW completed education on available mental health resources within her area to utilize. Patient admits to experiencing some sadness due to grief from her mother's passing. Emotional support provided. 10/13/21 UPDATE- Patient reports that she is working hard on living a healthier life both physically and mentally. Patient reports that she is making active changes in her diet. She reports that she  eventually wishes to quit smoking again and has started picking up new hobbies to implement during times of craving such as coloring, crocheting and being creative. Patient reports that she is unsure if she needs mental health support at this time because her main concerns are centered around her and her spouse's physical health instead their mental health. [facility] LCSW educated patient on how they affect one another and their importance. Patient denies any urgent concerns. She reports no issues with affording food due to food stamp assistance. [facility] LCSW will reassess social work needs 30 days from now.   Managing Loss, Adult People experience loss in many different ways throughout their lives. Events such as moving, changing jobs, and losing friends can create a sense of loss. The loss may be as serious as a major health change, divorce, death of a pet, or death of a loved one. All of these types of loss are likely to create a physical and emotional reaction known as grief. Grief is the result of a major change or an absence of something or someone that you count on. Grief is a normal reaction to loss. A variety of factors can affect your grieving experience, including: The nature of your loss. Your relationship to what or whom you lost. Your understanding of grief and how to manage it. Your support system. How to manage lifestyle changes Keep to your normal routine as much as possible. If you have trouble focusing or doing normal activities, it is acceptable to take  some time away from your normal routine. Spend time with friends and loved ones. Eat a healthy diet, get plenty of sleep, and rest when you feel tired. How to recognize changes  The way that you deal with your grief will affect your ability to function as you normally do. When grieving, you may experience these changes: Numbness, shock, sadness, anxiety, anger, denial, and guilt. Thoughts about death. Unexpected crying. A physical  sensation of emptiness in your stomach. Problems sleeping and eating. Tiredness (fatigue). Loss of interest in normal activities. Dreaming about or imagining seeing the person who died. A need to remember what or whom you lost. Difficulty thinking about anything other than your loss for a period of time. Relief. If you have been expecting the loss for a while, you may feel a sense of relief when it happens. Follow these instructions at home:    Activity Express your feelings in healthy ways, such as: Talking with others about your loss. It may be helpful to find others who have had a similar loss, such as a support group. Writing down your feelings in a journal. Doing physical activities to release stress and emotional energy. Doing creative activities like painting, sculpting, or playing or listening to music. Practicing resilience. This is the ability to recover and adjust after facing challenges. Reading some resources that encourage resilience may help you to learn ways to practice those behaviors. General instructions Be patient with yourself and others. Allow the grieving process to happen, and remember that grieving takes time. It is likely that you may never feel completely done with some grief. You may find a way to move on while still cherishing memories and feelings about your loss. Accepting your loss is a process. It can take months or longer to adjust. Keep all follow-up visits as told by your health care provider. This is important. Where to find support To get support for managing loss: Ask your health care provider for help and recommendations, such as grief counseling or therapy. Think about joining a support group for people who are managing a loss. Where to find more information You can find more information about managing loss from: American Society of Clinical Oncology: www.cancer.net American Psychological Association: TVStereos.ch Contact a health care provider  if: Your grief is extreme and keeps getting worse. You have ongoing grief that does not improve. Your body shows symptoms of grief, such as illness. You feel depressed, anxious, or lonely. Get help right away if: You have thoughts about hurting yourself or others. If you ever feel like you may hurt yourself or others, or have thoughts about taking your own life, get help right away. You can go to your nearest emergency department or call: Your local emergency services (911 in the [place] A suicide crisis helpline, such as the [place] at 9125420194. This is open 24 hours a day. Summary Grief is the result of a major change or an absence of someone or something that you count on. Grief is a normal reaction to loss. The depth of grief and the period of recovery depend on the type of loss and your ability to adjust to the change and process your feelings. Processing grief requires patience and a willingness to accept your feelings and talk about your loss with people who are supportive. It is important to find resources that work for you and to realize that people experience grief differently. There is not one grieving process that works for everyone in the same  way. Be aware that when grief becomes extreme, it can lead to more severe issues like isolation, depression, anxiety, or suicidal thoughts. Talk with your health care provider if you have any of these issues. This information is not intended to replace advice given to you by your health care provider. Make sure you discuss any questions you have with your health care provider. Document Revised: 06/17/2018 Document Reviewed: 08/27/2016 Elsevier Patient Education  [place] for Managing Grief   When you are experiencing grief, many resources and support are available to help you. You are not alone.    Grief Share (https://www.https://jacobson-moore.net/):    [employer] [facility] [place], [place]  [phone] [address] [place], [place], [place], [place], [place]   [facility] [address] [place], [place]   [place], [place]   If a Hospice or [place] team has been involved in the care of your loved one, you may reach out to your contact there or locally, you may reach out to [facility]:    [facility] [place] [facility]   [place], [place] [address] [phone] Patient was educated on healthy self-care as she admits she has little motivation to walk lately. PCS recommendation was suggested.   Patient Self Care Activities:  Attends all scheduled provider appointments Calls provider office for new concerns or questions  Please see past updates related to this goal by clicking on the "Past Updates" button in the selected goal        Follow up:  Patient agrees to Care Plan and Follow-up.  Plan: The Managed Medicaid care management team will reach out to the patient again over the next 45 days.  Date/time of next scheduled Social Work care management/care coordination outreach:  11/17/21 at 1:00 pm.   Eula Fried, BSW, MSW, [place] Medicaid LCSW [email or web address] Phone: [phone]

## 2021-10-13 NOTE — Patient Instructions (Signed)
Visit Information  Ms. Zara was given information about Medicaid Managed Care team care coordination services as a part of their [facility] Medicaid benefit. Mayrin Danielys Madry verbally consented to engagement with the [facility] Managed Care team.   If you are experiencing a medical emergency, please call 911 or report to your local emergency department or urgent care.   If you have a non-emergency medical problem during routine business hours, please contact your provider's office and ask to speak with a nurse.   For questions related to your [facility] health plan, please call: [phone] or go here:[email or web address]  If you would like to schedule transportation through your [facility] plan, please call the following number at least 2 days in advance of your appointment: [phone].  You can also use the MTM portal or MTM mobile app to manage your rides. For the portal, please go to [email or web address]  Call the [facility] Crisis Line at [phone], at any time, 24 hours a day, 7 days a week. If you are in danger or need immediate medical attention call 911.  If you would like help to quit smoking, call 1-800-QUIT-NOW (865-657-8791) OR Espaol: 1-855-Djelo-Ya (7-425-956-3875) o para ms informacin haga clic aqu or Text READY to 643-329 to register via text  Following is a copy of your plan of care:  Care Plan : General Social Work (Adult)  Updates made by Gustavus Bryant, LCSW since 10/13/2021 12:00 AM     Problem: Coping Skills (General Plan of Care)      Long-Range Goal: Coping Skills Enhanced   Start Date: 11/07/2020  Expected End Date: 04/10/2021  Recent Progress: On track  Priority: High  Note:   Timeframe:  Long-Range Goal Priority:  High Start Date:       08/26/21                    Expected End Date:  ongoing  Follow up date: 11/17/21 at 1:00 pm.   Current Barriers:  Financial constraints Mental Health Concerns  Social  Isolation Limited education about mental health support resources that are available to her within the local area* Cognitive Deficits Lacks knowledge of community resource  Clinical Social Work Clinical Goal(s):  Over the next 90 days, client will work with SW to address concerns related to experiencing ongoing symptoms of depression, sadness and grief since the passing of her mother Over the next 90 days, patient will work with LCSW to address needs related to implementing appropriate self-care and depression management.   Managing Loss, Adult People experience loss in many different ways throughout their lives. Events such as moving, changing jobs, and losing friends can create a sense of loss. The loss may be as serious as a major health change, divorce, death of a pet, or death of a loved one. All of these types of loss are likely to create a physical and emotional reaction known as grief. Grief is the result of a major change or an absence of something or someone that you count on. Grief is a normal reaction to loss. A variety of factors can affect your grieving experience, including: The nature of your loss. Your relationship to what or whom you lost. Your understanding of grief and how to manage it. Your support system. How to manage lifestyle changes Keep to your normal routine as much as possible. If you have trouble focusing or doing normal activities, it is acceptable to take some time away from your normal routine. Spend time with  friends and loved ones. Eat a healthy diet, get plenty of sleep, and rest when you feel tired. How to recognize changes  The way that you deal with your grief will affect your ability to function as you normally do. When grieving, you may experience these changes: Numbness, shock, sadness, anxiety, anger, denial, and guilt. Thoughts about death. Unexpected crying. A physical sensation of emptiness in your stomach. Problems sleeping and  eating. Tiredness (fatigue). Loss of interest in normal activities. Dreaming about or imagining seeing the person who died. A need to remember what or whom you lost. Difficulty thinking about anything other than your loss for a period of time. Relief. If you have been expecting the loss for a while, you may feel a sense of relief when it happens. Follow these instructions at home:    Activity Express your feelings in healthy ways, such as: Talking with others about your loss. It may be helpful to find others who have had a similar loss, such as a support group. Writing down your feelings in a journal. Doing physical activities to release stress and emotional energy. Doing creative activities like painting, sculpting, or playing or listening to music. Practicing resilience. This is the ability to recover and adjust after facing challenges. Reading some resources that encourage resilience may help you to learn ways to practice those behaviors. General instructions Be patient with yourself and others. Allow the grieving process to happen, and remember that grieving takes time. It is likely that you may never feel completely done with some grief. You may find a way to move on while still cherishing memories and feelings about your loss. Accepting your loss is a process. It can take months or longer to adjust. Keep all follow-up visits as told by your health care provider. This is important. Where to find support To get support for managing loss: Ask your health care provider for help and recommendations, such as grief counseling or therapy. Think about joining a support group for people who are managing a loss. Where to find more information You can find more information about managing loss from: American Society of Clinical Oncology: www.cancer.net American Psychological Association: DiceTournament.ca Contact a health care provider if: Your grief is extreme and keeps getting worse. You have  ongoing grief that does not improve. Your body shows symptoms of grief, such as illness. You feel depressed, anxious, or lonely. Get help right away if: You have thoughts about hurting yourself or others. If you ever feel like you may hurt yourself or others, or have thoughts about taking your own life, get help right away. You can go to your nearest emergency department or call: Your local emergency services (911 in the [place] A suicide crisis helpline, such as the National Suicide Prevention Lifeline at 639-657-6150. This is open 24 hours a day. Summary Grief is the result of a major change or an absence of someone or something that you count on. Grief is a normal reaction to loss. The depth of grief and the period of recovery depend on the type of loss and your ability to adjust to the change and process your feelings. Processing grief requires patience and a willingness to accept your feelings and talk about your loss with people who are supportive. It is important to find resources that work for you and to realize that people experience grief differently. There is not one grieving process that works for everyone in the same way. Be aware that when grief becomes extreme, it can  lead to more severe issues like isolation, depression, anxiety, or suicidal thoughts. Talk with your health care provider if you have any of these issues. This information is not intended to replace advice given to you by your health care provider. Make sure you discuss any questions you have with your health care provider. Document Revised: 06/17/2018 Document Reviewed: 08/27/2016 Elsevier Patient Education  2020 [employer].  Help for Managing Grief   When you are experiencing grief, many resources and support are available to help you. You are not alone.    Grief Share (https://[email or web address]):    [employer] of [address] [place], [place]      [employer]  [phone] S.  [facility] [address] [place], [place]     [facility] [address] [place], [place]      [facility] [address] [place], [place]  [phone]   [facility] [address] [place], [place] [phone]   [facility] [address] [place], [place]     [phone]   [facility] [address] [place], [place]     [phone]   If a Hospice or Palliative Care team has been involved in the care of your loved one, you may reach out to your contact there or locally, you may reach out to [facility]:    [facility] of [facility]   [address] [place], [place] [address] [phone] Patient was educated on healthy self-care as she admits she has little motivation to walk lately. PCS recommendation was suggested.   Patient Self Care Activities:  Attends all scheduled provider appointments Calls provider office for new concerns or questions  Please see past updates related to this goal by clicking on the "Past Updates" button in the selected goal   [name], BSW, MSW, [employer] Managed Medicaid LCSW [facility]  [facility] [email or web address] Phone: [phone]

## 2021-10-22 ENCOUNTER — Ambulatory Visit (INDEPENDENT_AMBULATORY_CARE_PROVIDER_SITE_OTHER): Payer: Medicaid Other | Admitting: Nurse Practitioner

## 2021-10-22 ENCOUNTER — Encounter: Payer: Self-pay | Admitting: Nurse Practitioner

## 2021-10-22 VITALS — BP 149/71 | HR 63 | Temp 97.8°F

## 2021-10-22 DIAGNOSIS — R7301 Impaired fasting glucose: Secondary | ICD-10-CM

## 2021-10-22 DIAGNOSIS — E039 Hypothyroidism, unspecified: Secondary | ICD-10-CM

## 2021-10-22 DIAGNOSIS — I10 Essential (primary) hypertension: Secondary | ICD-10-CM | POA: Diagnosis not present

## 2021-10-22 DIAGNOSIS — J449 Chronic obstructive pulmonary disease, unspecified: Secondary | ICD-10-CM | POA: Diagnosis not present

## 2021-10-22 DIAGNOSIS — I872 Venous insufficiency (chronic) (peripheral): Secondary | ICD-10-CM | POA: Diagnosis not present

## 2021-10-22 NOTE — Assessment & Plan Note (Signed)
Chronic. Controlled in office today.  Has an appointment set up with Cardiology.  Her swelling has improved some from last visit.  Will decrease hydralazine 25mg  from TID to BID.  Follow up in 1 month for reevaluation.

## 2021-10-22 NOTE — Progress Notes (Signed)
BP (!) 149/71   Pulse 63   Temp 97.8 F (36.6 C) (Oral)   LMP 06/09/2017 (Approximate)   SpO2 97%    Subjective:    Patient ID: Cheryl Chandler, female    DOB: December 31, 1965, 56 y.o.   MRN: [id number]  HPI: Cheryl Chandler is a 56 y.o. female  Chief Complaint  Patient presents with   Hypertension   Hyperlipidemia   Diabetes    3 month follow up    HYPERTENSION / HYPERLIPIDEMIA Satisfied with current treatment? yes Duration of hypertension: years BP monitoring frequency: daily BP range: 100/60 BP medication side effects: no Past BP meds:  lasix and lisinopril, hydralazine Duration of hyperlipidemia: years Cholesterol medication side effects: no Cholesterol supplements: none Past cholesterol medications: simvastatin (zocor) Medication compliance: excellent compliance Aspirin: no Recent stressors: no Recurrent headaches: no Visual changes: no Palpitations: no Dyspnea: yes Chest pain: no Lower extremity edema: yes Dizzy/lightheaded: no  COPD COPD status: controlled Satisfied with current treatment?: yes Oxygen use: no Dyspnea frequency: yes Cough frequency: daily Rescue inhaler frequency: has cut back from 4-3 times daily Limitation of activity: yes Productive cough:  Last Spirometry:  Pneumovax: Up to Date Influenza: Up to Date  HYPOTHYROIDISM Thyroid control status:controlled Satisfied with current treatment? yes Medication side effects: no Medication compliance: excellent compliance Etiology of hypothyroidism:  Recent dose adjustment:no Fatigue: yes Cold intolerance: no Heat intolerance: no Weight gain: no Weight loss: no Constipation: no Diarrhea/loose stools: no Palpitations: no Lower extremity edema: yes Anxiety/depressed mood: no  DEPRESSION/ANXIETY Patient states she feels like her mood is doing okay.  She is worried about her husband due to his recent eye infection.  States she has been a little feisty lately.  She does have  days when she is sad from losing people in her life.  Has a lot going on in her life that is causing her to worry a lot.  Denies SI.  [place] Office Visit from 10/22/2021 in [place]  PHQ-9 Total Score 14         10/22/2021    2:28 PM 09/18/2021    2:37 PM 07/02/2021    1:15 PM 06/04/2021    2:29 PM  GAD 7 : Generalized Anxiety Score  Nervous, Anxious, on Edge 2 3 0 1  Control/stop worrying _0 Worry too much - different things _1 Trouble relaxing _2 Restless 1 0 0 2  Easily annoyed or irritable 2 0 0 0  Afraid - awful might happen 3 2 0 1  Total GAD 7 Score _3 Anxiety Difficulty Very difficult Not difficult at all Somewhat difficult Somewhat difficult      Relevant past medical, surgical, family and social history reviewed and updated as indicated. Interim medical history since our last visit reviewed. Allergies and medications reviewed and updated.  Review of Systems  Constitutional:  Negative for fever and unexpected weight change.  Eyes:  Negative for visual disturbance.  Respiratory:  Negative for cough, chest tightness and shortness of breath.   Cardiovascular:  Positive for leg swelling. Negative for chest pain and palpitations.  Endocrine: Negative for cold intolerance.  Neurological:  Negative for dizziness and headaches.  Psychiatric/Behavioral:  Positive for dysphoric mood. Negative for suicidal ideas. The patient is nervous/anxious.     Per HPI unless specifically indicated above     Objective:    BP (!) 149/71   Pulse  63   Temp 97.8 F (36.6 C) (Oral)   LMP 06/09/2017 (Approximate)   SpO2 97%   Wt Readings from Last 3 Encounters:  07/28/21 (!) 320 lb (145.2 kg)  06/04/21 (!) 331 lb (150.1 kg)  02/05/21 290 lb (131.5 kg)    Physical Exam Vitals and nursing note reviewed.  Constitutional:      General: She is not in acute distress.    Appearance: Normal appearance. She is normal weight. She is not  ill-appearing, toxic-appearing or diaphoretic.  HENT:     Head: Normocephalic.     Right Ear: External ear normal.     Left Ear: External ear normal.     Nose: Nose normal.     Mouth/Throat:     Mouth: Mucous membranes are moist.     Pharynx: Oropharynx is clear.  Eyes:     General:        Right eye: No discharge.        Left eye: No discharge.     Extraocular Movements: Extraocular movements intact.     Conjunctiva/sclera: Conjunctivae normal.     Pupils: Pupils are equal, round, and reactive to light.  Cardiovascular:     Rate and Rhythm: Normal rate and regular rhythm.     Heart sounds: No murmur heard. Pulmonary:     Effort: Pulmonary effort is normal. No respiratory distress.     Breath sounds: Decreased breath sounds present. No wheezing or rales.  Musculoskeletal:     Cervical back: Normal range of motion and neck supple.     Right lower leg: Edema present.     Left lower leg: Edema present.     Comments: Swelling has improved some from last visit.   Skin:    General: Skin is warm and dry.     Capillary Refill: Capillary refill takes less than 2 seconds.  Neurological:     General: No focal deficit present.     Mental Status: She is alert and oriented to person, place, and time. Mental status is at baseline.  Psychiatric:        Mood and Affect: Mood normal.        Behavior: Behavior normal.        Thought Content: Thought content normal.        Judgment: Judgment normal.     Results for orders placed or performed in visit on 09/18/21  Comp Met (CMET)  Result Value Ref Range   Glucose 87 70 - 99 mg/dL   BUN 14 6 - 24 mg/dL   Creatinine, Ser 0.75 0.57 - 1.00 mg/dL   eGFR 94 >59 mL/min/1.73   BUN/Creatinine Ratio 19 9 - 23   Sodium 138 134 - 144 mmol/L   Potassium 4.5 3.5 - 5.2 mmol/L   Chloride 101 96 - 106 mmol/L   CO2 25 20 - 29 mmol/L   Calcium 8.7 8.7 - 10.2 mg/dL   Total Protein 6.6 6.0 - 8.5 g/dL   Albumin 3.6 (L) 3.8 - 4.9 g/dL   Globulin, Total  3.0 1.5 - 4.5 g/dL   Albumin/Globulin Ratio 1.2 1.2 - 2.2   Bilirubin Total 0.3 0.0 - 1.2 mg/dL   Alkaline Phosphatase 96 44 - 121 IU/L   AST 15 0 - 40 IU/L   ALT 19 0 - 32 IU/L  TSH  Result Value Ref Range   TSH 2.260 0.450 - 4.500 uIU/mL  T4, free  Result Value Ref Range   Free T4 1.16 0.82 -  1.77 ng/dL  HgB A1c  Result Value Ref Range   Hgb A1c MFr Bld 5.8 (H) 4.8 - 5.6 %   Est. average glucose Bld gHb Est-mCnc 120 mg/dL  Vitamin D (25 hydroxy)  Result Value Ref Range   Vit D, 25-Hydroxy 34.0 30.0 - 100.0 ng/mL  Lipid Profile  Result Value Ref Range   Cholesterol, Total 130 100 - 199 mg/dL   Triglycerides 99 0 - 149 mg/dL   HDL 33 (L) >39 mg/dL   VLDL Cholesterol Cal 19 5 - 40 mg/dL   LDL Chol Calc (NIH) 78 0 - 99 mg/dL   Chol/HDL Ratio 3.9 0.0 - 4.4 ratio      Assessment & Plan:   Problem List Items Addressed This Visit       Cardiovascular and Mediastinum   Essential hypertension - Primary    Chronic. Controlled in office today.  Has an appointment set up with Cardiology.  Her swelling has improved some from last visit.  Will decrease hydralazine 13m from TID to BID.  Follow up in 1 month for reevaluation.      Chronic venous insufficiency    Followed by Vascular.  Swelling has improved.  Is continuing to do exercises that she was given by physical therapy.  Continue to follow per vascular's recommendations.        Respiratory   COPD (chronic obstructive pulmonary disease) (HCC)    Chronic. Improved.  Does have SOB. Using Spiriva daily. Reviewed medications with patient during visit.  Discussed decreasing albuterol use to PRN and not 3 TID.  Patient has very limited mobility due to SOB.  Follow up in 1 month for reevaluation.        Endocrine   Adult hypothyroidism    Chronic.  Controlled.  Continue with current medication regimen. Return to clinic in 6 months for reevaluation.  Call sooner if concerns arise.        IFG (impaired fasting glucose)     Controlled with current regimen.  Will continue to monitor at future visits.        Follow up plan: Return in about 1 month (around 11/21/2021) for BP Check.

## 2021-10-22 NOTE — Assessment & Plan Note (Signed)
Followed by Vascular.  Swelling has improved.  Is continuing to do exercises that she was given by physical therapy.  Continue to follow per vascular's recommendations.

## 2021-10-22 NOTE — Assessment & Plan Note (Signed)
Chronic.  Controlled.  Continue with current medication regimen.  Return to clinic in 6 months for reevaluation.  Call sooner if concerns arise.  ? ?

## 2021-10-22 NOTE — Assessment & Plan Note (Signed)
Chronic. Improved.  Does have SOB. Using Spiriva daily. Reviewed medications with patient during visit.  Discussed decreasing albuterol use to PRN and not 3 TID.  Patient has very limited mobility due to SOB.  Follow up in 1 month for reevaluation.

## 2021-10-22 NOTE — Assessment & Plan Note (Addendum)
Controlled with current regimen.  Will continue to monitor at future visits.

## 2021-10-25 DIAGNOSIS — Z419 Encounter for procedure for purposes other than remedying health state, unspecified: Secondary | ICD-10-CM | POA: Diagnosis not present

## 2021-11-03 ENCOUNTER — Other Ambulatory Visit: Payer: Self-pay | Admitting: Obstetrics and Gynecology

## 2021-11-03 NOTE — Patient Instructions (Signed)
Visit Information  Cheryl Chandler was given information about Medicaid Managed Care team care coordination services as a part of their [facility] Medicaid benefit. Cheryl Chandler verbally consented to engagement with the [facility] Managed Care team.   If you are experiencing a medical emergency, please call 911 or report to your local emergency department or urgent care.   If you have a non-emergency medical problem during routine business hours, please contact your provider's office and ask to speak with a nurse.   For questions related to your [facility] health plan, please call: [phone] or go here:[email or web address]  If you would like to schedule transportation through your [facility] plan, please call the following number at least 2 days in advance of your appointment: [phone].  You can also use the MTM portal or MTM mobile app to manage your rides. For the portal, please go to [email or web address]  Call the [facility] Crisis Line at [phone], at any time, 24 hours a day, 7 days a week. If you are in danger or need immediate medical attention call 911.  If you would like help to quit smoking, call 1-800-QUIT-NOW (432-839-9862) OR Espaol: 1-855-Djelo-Ya (5-102-585-2778) o para ms informacin haga clic aqu or Text READY to 242-353 to register via text  Cheryl Chandler - following are the goals we discussed in your visit today:   Goals Addressed             This Visit's Progress    RNCM: Keep Skin Clean and Dry       Follow Up Date: 11/03/21  - clean and dry skin well - keep skin dry - use a fragrance-free lotion on skin - wear a protective pad or garment    Why is this important?   Leaking urine (pee) can cause soreness from skin rashes and redness.  This is caused by skin being exposed to urine (pee).    11/03/21:  Patient states her skin is better.     RNCM: Track and Manage My Symptoms-Depression       Timeframe:  Long-Range  Goal Priority:  High Start Date:    12-02-2020                         Expected End Date:   ongoing                Follow Up Date: 12/05/21   - avoid negative self-talk - develop a personal safety plan - develop a plan to deal with triggers like holidays, anniversaries - exercise at least 2 to 3 times per week - have a plan for how to handle bad days - journal feelings and what helps to feel better or worse - spend time or talk with others at least 2 to 3 times per week - spend time or talk with others every day - watch for early signs of feeling worse    Why is this important?   Keeping track of your progress will help your treatment team find the right mix of medicine and therapy for you.  Write in your journal every day.  Day-to-day changes in depression symptoms are normal. It may be more helpful to check your progress at the end of each week instead of every day.    11/03/21:  LCSW and BSW continue to follow     RNCM: Track and Manage My Triggers-COPD       Timeframe:  Long-Range Goal Priority:  Medium Start Date:  05-22-2020                        Expected End Date:   ongoing          Follow Up Date: 12/05/21   - avoid second hand smoke - eliminate smoking in my home - identify and avoid work-related triggers - identify and remove indoor air pollutants - limit outdoor activity during cold weather - listen for public air quality announcements every day    Why is this important?   Triggers are activities or things, like tobacco smoke or cold weather, that make your COPD (chronic obstructive pulmonary disease) flare-up.  Knowing these triggers helps you plan how to stay away from them.  When you cannot remove them, you can learn how to manage them.    11/03/21:  patient states breathing unchanged-Oxygen Saturations WNL   Patient verbalizes understanding of instructions and care plan provided today and agrees to view in MyChart. Active MyChart status and patient  understanding of how to access instructions and care plan via MyChart confirmed with patient.     The Managed Medicaid care management team will reach out to the patient again over the next 30 business days.  The  Patient has been provided with contact information for the Managed Medicaid care management team and has been advised to call with any health related questions or concerns.   Kathi Der RN, BSN [place]  [facility] Care Management Coordinator - Managed Medicaid High Risk [phone]   Following is a copy of your plan of care:  Care Plan : RNCM: COPD (Adult)  Updates made by [name], RN since 11/03/2021 12:00 AM     Problem: RNCM: Psychological Adjustment to Diagnosis (COPD)   Priority: Medium  Onset Date: 05/22/2020     Long-Range Goal: RNCM: Adjustment to Disease Achieved   Start Date: 05/22/2020  Expected End Date: 02/03/2022  Recent Progress: On track  Priority: High  Note:   Current Barriers:  Knowledge deficits related to basic understanding of COPD disease process Knowledge deficits related to basic COPD self care/management Knowledge deficit related to basic understanding of how to use inhalers and how inhaled medications work Knowledge deficit related to importance of energy conservation Limited Social Support Unable to independently manage COPD Lacks social connections Does not contact provider office for questions/concerns  11/03/21:  Patient states her breathing is the same and Oxygen saturations WNL. Case Manager Clinical Goal(s): patient will report using inhalers as prescribed including rinsing mouth after use patient will be able to verbalize understanding of COPD action plan and when to seek appropriate levels of medical care  patient will engage in lite exercise as tolerated to build/regain stamina and strength and reduce shortness of breath through activity tolerance  patient will verbalize basic understanding of COPD disease  process and self care activities  Interventions:  Collaboration with [name], NP regarding development and update of comprehensive plan of care as evidenced by provider attestation and co-signature Inter-disciplinary care team collaboration (see longitudinal plan of care) UNABLE to independently: manage COPD Provided patient with basic written and verbal COPD education on self care/management/and exacerbation prevention Provided patient with COPD action plan and reinforced importance of daily self assessment.  Provided written and verbal instructions on pursed lip breathing and utilized returned demonstration as teach back Provided instruction about proper use of medications used for management of COPD including inhalers Advised patient to self assesses COPD action plan zone  and make appointment with provider if in the yellow zone for 48 hours without improvement. Provided patient with education about the role of exercise in the management of COPD Advised patient to engage in light exercise as tolerated 3-5 days a week Provided education about and advised patient to utilize infection prevention strategies to reduce risk of respiratory infection. Patient Goals/Self-Care Activities:  - decision-making supported - depression screen reviewed - emotional support provided - family involvement promoted - problem-solving facilitated - relaxation techniques promoted - verbalization of feelings encouraged Follow Up Plan: Telephone follow up appointment with care management team member scheduled.   Problem: RNCM: Symptom Exacerbation (COPD)   Priority: High  Onset Date: 05/22/2020     Long-Range Goal: RNCM: Smoking Cessation Symptom Exacerbation Prevented or Minimized   Start Date: 05/22/2020  Expected End Date: 12/21/2021  Recent Progress: Not on track  Priority: High  Note:   Current Barriers:  Unable to independently stop smoking  Does not adhere to provider recommendations re:  smoking cessation  Lacks social connections Does not contact provider office for questions/concerns Tobacco abuse of >40 years; trying to stop-says she has stopped Previous quit attempts, unsuccessful several successful using patches, gum- has a pattern of stopping and then starting back  Reports smoking within 30 minutes of waking up Reports triggers to smoke include: stress, chronic conditions  Reports motivation to quit smoking includes: knows it will help her breathing and other chronic conditions  On a scale of 1-10, reports MOTIVATION to quit is 8 On a scale of 1-10, reports CONFIDENCE in quitting is 7 11/03/21:  Patient states she is smoking less now-2 cigarettes a day.  Clinical Goal(s):  patient will work with RN Case Engineer, civil (consulting) and provider towards tobacco cessation  Interventions: Collaboration with [name], NP regarding development and update of comprehensive plan of care as evidenced by provider attestation and co-signature Inter-disciplinary care team collaboration (see longitudinal plan of care) Evaluation of current treatment plan reviewed.  Provided contact information for [place] Quit Line (1-800-QUIT-NOW). Patient will outreach this group for support. Update 01/22/21:  Patient states she has utilized [employer] for resources. Discussed plans with patient for ongoing care management follow up and provided patient with direct contact information for care management team Provided patient with printed smoking cessation educational materials Provided contact information for [place] Quit Line (1-800-QUIT-NOW). Patient will outreach this group for support. Praised patient for decreasing smoking- continued encouragement given. Patient Goals/Self-Care Activities patient will:  - mindfulness, talking to family and friends as a habit during cravings  - verbally commit to reducing tobacco consumption - barriers to lifestyle changes reviewed and  addressed - barriers to treatment reviewed and addressed - breathing techniques encouraged - modification of home and work environment promoted - rescue (action) plan reviewed - signs/symptoms of infection reviewed - signs/symptoms of worsening disease assessed - treatment plan reviewed Follow Up Plan: Telephone follow up appointment with care management team member scheduled.    Care Plan : RNCM: Hypertension (Adult)  Updates made by [name], RN since 11/03/2021 12:00 AM     Problem: RNCM: Hypertension (Hypertension)   Priority: Medium  Onset Date: 05/22/2020     Long-Range Goal: RNCM: Hypertension Monitored   Start Date: 05/22/2020  Expected End Date: 12/21/2021  Recent Progress: On track  Priority: Medium  Note:    Current Barriers:  Knowledge Deficits related to basic understanding of hypertension pathophysiology and self care management Knowledge Deficits related to understanding of  medications prescribed for management of hypertension Limited Social Support Unable to independently manage HTN Lacks social connections Does not contact provider office for questions/concern 11/03/21:  Patient checks blood pressure daily, recent BP at PCP visit on 6/28 149/71.   Case Manager Clinical Goal(s):   patient will verbalize understanding of plan for hypertension management patient will demonstrate improved adherence to prescribed treatment plan for hypertension as evidenced by taking all medications as prescribed, monitoring and recording blood pressure as directed, adhering to low sodium/DASH diet patient will demonstrate improved health management independence as evidenced by checking blood pressure as directed and notifying PCP if SBP>160 or DBP > 90, taking all medications as prescribe, and adhering to a low sodium diet as discussed. Interventions:  Collaboration with [name], NP regarding development and update of comprehensive plan of care as evidenced by provider  attestation and co-signature Inter-disciplinary care team collaboration (see longitudinal plan of care) Evaluation of current treatment plan related to hypertension self management and patient's adherence to plan as established by provider.  Provided education to patient re: stroke prevention, s/s of heart attack and stroke, DASH diet, complications of uncontrolled blood pressure Reviewed medications with patient and discussed importance of compliance.  Discussed plans with patient for ongoing care management follow up and provided patient with direct contact information for care management team Advised patient, providing education and rationale, to monitor blood pressure daily and record, calling PCP for findings outside established parameters.  Patient Goals/Self-Care Activities Over the next 120 days, patient will:  - Self administers medications as prescribed Attends all scheduled provider appointments Calls provider office for new concerns, questions, or BP outside discussed parameters Checks BP and records as discussed Follows a low sodium diet/DASH diet - blood pressure trends reviewed - depression screen reviewed - home or ambulatory blood pressure monitoring encouraged Follow Up Plan: Telephone follow up appointment with care management team member scheduled.  To see PCP regarding BP-has appt scheduled   Care Plan : RNCM: Depression (Adult)  Updates made by [name], RN since 11/03/2021 12:00 AM     Problem: RNCM: Symptoms (Depression)   Priority: High  Onset Date: 12/02/2020     Long-Range Goal: RNCM: Symptoms Monitored and Managed   Start Date: 12/02/2020  Expected End Date: 02/04/2023  Recent Progress: Not on track  Priority: High  Note:   Current Barriers:  Ineffective Self Health Maintenance in a patient with Anxiety and Depression Unable to independently manage depression and anxiety as evidence of multiple chronic conditions and the patient feeling her health is  out of control  Does not attend all scheduled provider appointments Lacks social connections Unable to perform IADLs independently Does not contact provider office for questions/concerns 11/03/21:  patient has upcoming appointments with LCSW and BSW Clinical Goal(s):  Collaboration with [name], NP regarding development and update of comprehensive plan of care as evidenced by provider attestation and co-signature Inter-disciplinary care team collaboration (see longitudinal plan of care) patient will work with care management team to address care coordination and chronic disease management needs related to Disease Management Educational Needs Care Coordination Mental Health Counseling Level of Care Concerns   Interventions:  Evaluation of current treatment plan related to Anxiety and Depression, Financial constraints related to insurance not approving care for the patient to go to wound care , Limited social support, Level of care concerns, ADL IADL limitations, Mental Health Concerns , Family and relationship dysfunction, Substance abuse issues -  smoker , and Inability to perform IADL's  independently self-management and patient's adherence to plan as established by provider. Collaboration with [name], NP regarding development and update of comprehensive plan of care as evidenced by provider attestation       and co-signature Inter-disciplinary care team collaboration (see longitudinal plan of care) Discussed plans with patient for ongoing care management follow up and provided patient with direct contact information for care management team Evaluation of depression and anxiety.  LCSW referral for increased stress-completed Collaborated with LCSW  Self Care Activities:  Patient verbalizes understanding of plan to effectively manage depression and anxiety Self administers medications as prescribed Attends all scheduled provider appointments Calls pharmacy for medication  refills Attends church or other social activities Performs ADL's independently Performs IADL's independently Calls provider office for new concerns or questions Work with the [facility] team to meet needs for continued CCM services  Patient Goals: - activity or exercise based on tolerance encouraged - depression screen reviewed - emotional support provided - healthy lifestyle promoted - medication side effects monitored and managed - pain managed - participation in mental health treatment encouraged - quality of sleep assessed - response to mental health treatment monitored - response to pharmacologic therapy monitored - sleep hygiene techniques encouraged - social activities and relationships encouraged - substance use assessed Follow Up Plan: The care management team will reach out to the patient again over the next 30 business days.

## 2021-11-03 NOTE — Patient Outreach (Signed)
Medicaid Managed Care   Nurse Care Manager Note  11/03/2021 Name:  Cheryl Chandler MRN:  [id number] DOB:  08-29-1965  Cheryl Chandler is an 56 y.o. year old female who is a primary patient of [name], NP.  The [facility] Managed Care Coordination team was consulted for assistance with:    Chronic healthcare management needs, HTN, DM, tobacco use, anxiety/depression/PTSD, arthritis, LBP, lymphedema, COPD, GERD  [name] was given information about Medicaid Managed Care Coordination team services today. [name] agreed to services and verbal consent obtained.  Engaged with patient by telephone for follow up visit in response to provider referral for case management and/or care coordination services.   Assessments/Interventions:  Review of past medical history, allergies, medications, health status, including review of consultants reports, laboratory and other test data, was performed as part of comprehensive evaluation and provision of chronic care management services.  SDOH (Social Determinants of Health) assessments and interventions performed: [name]    Flowsheet Row Most Recent Value  [name]   Financial Strain Interventions Other (Comment)  [patient has set up payment plans for medical bills]       Care Plan  Allergies  Allergen Reactions   Codeine Shortness Of Breath and Rash   Percocet [Oxycodone-Acetaminophen] Shortness Of Breath   Sulfa Antibiotics Shortness Of Breath and Rash   Aspirin Hives   Bee Venom     Bee stings   Darvon [Propoxyphene]     Darvocet-N 100   Latex    Oxycodone Nausea And Vomiting   Penicillins     Has patient had a PCN reaction causing immediate rash, facial/tongue/throat swelling, SOB or lightheadedness with hypotension: Unknown Has patient had a PCN reaction causing severe rash involving mucus membranes or skin necrosis: Unknown Has patient had a PCN reaction that required  hospitalization: Unknown Has patient had a PCN reaction occurring within the last 10 years: Unknown If all of the above answers are "NO", then may proceed with Cephalosporin use.    Medications Reviewed Today     Reviewed by [name], RN (Registered Nurse) on 11/03/21 at 66  Med List Status: <None>   Medication Order Taking? Sig Documenting Provider Last Dose Status Informant  ACCU-CHEK GUIDE test strip [id number] No USE TO CHECK BLOOD SUGAR 3 TIMES DAILY AS DIRECTED [name], NP Taking Active   Accu-Chek Softclix Lancets lancets [id number] No CHECK BLOOD SUGAR TWICE DAILY [name], NP Taking Active   albuterol (PROVENTIL) (2.5 MG/3ML) 0.083% nebulizer solution [id number] No INHALE CONTENTS OF 1 VIAL IN NEBULIZER EVERY 4 HOURS AS NEEDED [name], NP Taking Active   amLODipine (NORVASC) 5 MG tablet [id number] No TAKE 1 TABLET BY MOUTH ONCE DAILY [name], NP Taking Active   atorvastatin (LIPITOR) 80 MG tablet [id number] No TAKE 1 TABLET BY MOUTH ONCE EVERY [name], NP Taking Active   Blood Glucose Monitoring Suppl (ACCU-CHEK GUIDE) w/Device KIT [id number] No Use to check blood sugar 3 times a day. [name], NP Taking Active   Blood Pressure Monitoring (OMRON 3 SERIES BP MONITOR) DEVI [id number] No Use to check blood pressure as directed [name], NP Taking Active   citalopram (CELEXA) 40 MG tablet [id number] No Take 1 tablet (40 mg total) by mouth daily. [name], NP Taking Active   EPINEPHrine 0.3 mg/0.3 mL IJ SOAJ injection [id number] No USE AS DIRECTED. [name], NP Taking Active   furosemide (LASIX) 20 MG tablet [id number] No TAKE 1 TABLET  BY MOUTH ONCE DAILY IN AFTERNOON AS NEEDED LEG EDEMA [name], NP Taking Active   gabapentin (NEURONTIN) 300 MG capsule [id number] No TAKE 1 CAPSULE BY MOUTH 3 TIMES DAILY ASNEEDED (NEED OFFICE VISIT FOR FURTHER REFILLS) [name], NP Taking Active    hydrALAZINE (APRESOLINE) 25 MG tablet [id number] No Take 1 tablet (25 mg total) by mouth 3 (three) times daily. [name], NP Taking Active   levothyroxine (SYNTHROID) 50 MCG tablet [id number] No TAKE 1 TABLET BY MOUTH ONCE DAILY ON AN EMPTY STOMACH. WAIT 30 MINUTES BEFORE TAKING OTHER MEDS. [name], NP Taking Active   lisinopril (ZESTRIL) 40 MG tablet [id number] No TAKE 1 TABLET BY MOUTH ONCE DAILY [name], NP Taking Active   meloxicam (MOBIC) 15 MG tablet [id number] No TAKE 1 TABLET BY MOUTH ONCE DAILY AS NEEDED WITH FOOD OR MILK [name], NP Taking Active   methocarbamol (ROBAXIN) 500 MG tablet [id number] No Take 500 mg by mouth every 8 (eight) hours as needed for muscle spasms. [provider] Taking Active   mometasone-formoterol (DULERA) 100-5 MCG/ACT AERO [id number] No INHALE 2 PUFFS BY MOUTH TWICE A DAY. RINSE MOUTH AFTER USE. [name], NP Taking Active   montelukast (SINGULAIR) 10 MG tablet [id number] No TAKE 1 TABLET BY MOUTH AT BEDTIME [name], NP Taking Active   nitroGLYCERIN (NITROSTAT) 0.4 MG SL tablet [id number] No DISSOLVE 1 TABLET UNDER TONGUE AT ONSET OF CHEST PAIN. REPEAT IN 5 MIN IF NOT RESOLVED, MAX 3 DOSES. 911 IF NEEDED. [name], NP Taking Active   omeprazole (PRILOSEC) 20 MG capsule [id number] No Take 1 capsule (20 mg total) by mouth daily. [name], NP Taking Active   SPIRIVA HANDIHALER 18 MCG inhalation capsule [id number] No INHALE CONTENTS OF 1 CAPSULE ONCE DAILY [name], NP Taking Active   triamcinolone cream (KENALOG) 0.1 % [id number] No APPLY 1 APPLICATION TOPICALLY TWICE DAILY [name], [name], NP Taking Active   triamcinolone cream (KENALOG) 0.1 % [id number] No Apply 1 application. topically 2 (two) times daily. [name], NP Taking Active   TRULICITY 2.26 JF/3.5KT [name] [id number] No INJECT 0.75MG SUBQ ONCE A Cheryl Sane, NP Taking Active   Vitamin D, Ergocalciferol,  (DRISDOL) 1.25 MG (50000 UNIT) CAPS capsule [id number] No TAKE 1 CAPSULE BY MOUTH EVERY 7 DAYS [name], NP Taking Active             Patient Active Problem List   Diagnosis Date Noted   Foot pain, right 09/01/2021   Fibrocystic disease of both breasts 05/07/2019   IFG (impaired fasting glucose) 05/03/2019   Chronic venous insufficiency 11/09/2018   Overactive bladder 09/30/2018   Peripheral neuropathy 09/30/2018   Apnea 01/02/2016   Lymphedema 11/30/2014   Dyspnea on exertion 11/30/2014   Anxiety 11/29/2014   Arthritis 11/29/2014   COPD (chronic obstructive pulmonary disease) ([place]) 11/29/2014   Clinical depression 11/29/2014   H/O eating disorder 11/29/2014   Esophageal reflux 11/29/2014   Acne inversa 11/29/2014   Essential hypertension 11/29/2014   HLD (hyperlipidemia) 11/29/2014   Adult hypothyroidism 11/29/2014   Insomnia 11/29/2014   Low back pain 11/29/2014   Morbid obesity ([place]) 11/29/2014   Posttraumatic stress disorder 11/29/2014   Vitamin D deficiency 11/29/2014   Nicotine dependence, cigarettes, uncomplicated 57/26/2035   Conditions to be addressed/monitored per PCP order:  Chronic healthcare management needs, HTN, DM, tobacco use, anxiety/depression/PTSD, arthritis, LBP, lymphedema, COPD, GERD  Care Plan : RNCM: COPD (Adult)  Updates made by [name], RN since 11/03/2021  12:00 AM     Problem: RNCM: Psychological Adjustment to Diagnosis (COPD)   Priority: Medium  Onset Date: 05/22/2020     Long-Range Goal: RNCM: Adjustment to Disease Achieved   Start Date: 05/22/2020  Expected End Date: 02/03/2022  Recent Progress: On track  Priority: High  Note:   Current Barriers:  Knowledge deficits related to basic understanding of COPD disease process Knowledge deficits related to basic COPD self care/management Knowledge deficit related to basic understanding of how to use inhalers and how inhaled medications work Knowledge deficit related to  importance of energy conservation Limited Social Support Unable to independently manage COPD Lacks social connections Does not contact provider office for questions/concerns  11/03/21:  Patient states her breathing is the same and Oxygen saturations WNL. Case Manager Clinical Goal(s): patient will report using inhalers as prescribed including rinsing mouth after use patient will be able to verbalize understanding of COPD action plan and when to seek appropriate levels of medical care  patient will engage in lite exercise as tolerated to build/regain stamina and strength and reduce shortness of breath through activity tolerance  patient will verbalize basic understanding of COPD disease process and self care activities  Interventions:  Collaboration with [name], NP regarding development and update of comprehensive plan of care as evidenced by provider attestation and co-signature Inter-disciplinary care team collaboration (see longitudinal plan of care) UNABLE to independently: manage COPD Provided patient with basic written and verbal COPD education on self care/management/and exacerbation prevention Provided patient with COPD action plan and reinforced importance of daily self assessment.  Provided written and verbal instructions on pursed lip breathing and utilized returned demonstration as teach back Provided instruction about proper use of medications used for management of COPD including inhalers Advised patient to self assesses COPD action plan zone and make appointment with provider if in the yellow zone for 48 hours without improvement. Provided patient with education about the role of exercise in the management of COPD Advised patient to engage in light exercise as tolerated 3-5 days a week Provided education about and advised patient to utilize infection prevention strategies to reduce risk of respiratory infection. Patient Goals/Self-Care Activities:  - decision-making  supported - depression screen reviewed - emotional support provided - family involvement promoted - problem-solving facilitated - relaxation techniques promoted - verbalization of feelings encouraged Follow Up Plan: Telephone follow up appointment with care management team member scheduled.   Problem: RNCM: Symptom Exacerbation (COPD)   Priority: High  Onset Date: 05/22/2020     Long-Range Goal: RNCM: Smoking Cessation Symptom Exacerbation Prevented or Minimized   Start Date: 05/22/2020  Expected End Date: 12/21/2021  Recent Progress: Not on track  Priority: High  Note:   Current Barriers:  Unable to independently stop smoking  Does not adhere to provider recommendations re: smoking cessation  Lacks social connections Does not contact provider office for questions/concerns Tobacco abuse of >40 years; trying to stop-says she has stopped Previous quit attempts, unsuccessful several successful using patches, gum- has a pattern of stopping and then starting back  Reports smoking within 30 minutes of waking up Reports triggers to smoke include: stress, chronic conditions  Reports motivation to quit smoking includes: knows it will help her breathing and other chronic conditions  On a scale of 1-10, reports MOTIVATION to quit is 8 On a scale of 1-10, reports CONFIDENCE in quitting is 7 11/03/21:  Patient states she is smoking less now-2 cigarettes a day.  Clinical Goal(s):  patient will work with  RN Case Engineer, civil (consulting) and provider towards tobacco cessation  Interventions: Collaboration with [name], NP regarding development and update of comprehensive plan of care as evidenced by provider attestation and co-signature Inter-disciplinary care team collaboration (see longitudinal plan of care) Evaluation of current treatment plan reviewed.  Provided contact information for [place] Quit Line (1-800-QUIT-NOW). Patient will outreach this group for  support. Update 01/22/21:  Patient states she has utilized [employer] for resources. Discussed plans with patient for ongoing care management follow up and provided patient with direct contact information for care management team Provided patient with printed smoking cessation educational materials Provided contact information for [place] [facility]uit Line (1-800-QUIT-NOW). Patient will outreach this group for support. Praised patient for decreasing smoking- continued encouragement given. Patient Goals/Self-Care Activities patient will:  - mindfulness, talking to family and friends as a habit during cravings  - verbally commit to reducing tobacco consumption - barriers to lifestyle changes reviewed and addressed - barriers to treatment reviewed and addressed - breathing techniques encouraged - modification of home and work environment promoted - rescue (action) plan reviewed - signs/symptoms of infection reviewed - signs/symptoms of worsening disease assessed - treatment plan reviewed Follow Up Plan: Telephone follow up appointment with care management team member scheduled.    Care Plan : RNCM: Hypertension (Adult)  Updates made by [name], RN since 11/03/2021 12:00 AM     Problem: RNCM: Hypertension (Hypertension)   Priority: Medium  Onset Date: 05/22/2020     Long-Range Goal: RNCM: Hypertension Monitored   Start Date: 05/22/2020  Expected End Date: 12/21/2021  Recent Progress: On track  Priority: Medium  Note:    Current Barriers:  Knowledge Deficits related to basic understanding of hypertension pathophysiology and self care management Knowledge Deficits related to understanding of medications prescribed for management of hypertension Limited Social Support Unable to independently manage HTN Lacks social connections Does not contact provider office for questions/concern 11/03/21:  Patient checks blood pressure daily, recent BP at PCP visit on 6/28 149/71.   Case Manager Clinical  Goal(s):   patient will verbalize understanding of plan for hypertension management patient will demonstrate improved adherence to prescribed treatment plan for hypertension as evidenced by taking all medications as prescribed, monitoring and recording blood pressure as directed, adhering to low sodium/DASH diet patient will demonstrate improved health management independence as evidenced by checking blood pressure as directed and notifying PCP if SBP>160 or DBP > 90, taking all medications as prescribe, and adhering to a low sodium diet as discussed. Interventions:  Collaboration with [name], NP regarding development and update of comprehensive plan of care as evidenced by provider attestation and co-signature Inter-disciplinary care team collaboration (see longitudinal plan of care) Evaluation of current treatment plan related to hypertension self management and patient's adherence to plan as established by provider.  Provided education to patient re: stroke prevention, s/s of heart attack and stroke, DASH diet, complications of uncontrolled blood pressure Reviewed medications with patient and discussed importance of compliance.  Discussed plans with patient for ongoing care management follow up and provided patient with direct contact information for care management team Advised patient, providing education and rationale, to monitor blood pressure daily and record, calling PCP for findings outside established parameters.  Patient Goals/Self-Care Activities Over the next 120 days, patient will:  - Self administers medications as prescribed Attends all scheduled provider appointments Calls provider office for new concerns, questions, or BP outside discussed parameters Checks BP and records as discussed Follows  a low sodium diet/DASH diet - blood pressure trends reviewed - depression screen reviewed - home or ambulatory blood pressure monitoring encouraged Follow Up Plan: Telephone  follow up appointment with care management team member scheduled.  To see PCP regarding BP-has appt scheduled   Care Plan : RNCM: Depression (Adult)  Updates made by [name], RN since 11/03/2021 12:00 AM     Problem: RNCM: Symptoms (Depression)   Priority: High  Onset Date: 12/02/2020     Long-Range Goal: RNCM: Symptoms Monitored and Managed   Start Date: 12/02/2020  Expected End Date: 02/04/2023  Recent Progress: Not on track  Priority: High  Note:   Current Barriers:  Ineffective Self Health Maintenance in a patient with Anxiety and Depression Unable to independently manage depression and anxiety as evidence of multiple chronic conditions and the patient feeling her health is out of control  Does not attend all scheduled provider appointments Lacks social connections Unable to perform IADLs independently Does not contact provider office for questions/concerns 11/03/21:  patient has upcoming appointments with LCSW and BSW Clinical Goal(s):  Collaboration with [name], NP regarding development and update of comprehensive plan of care as evidenced by provider attestation and co-signature Inter-disciplinary care team collaboration (see longitudinal plan of care) patient will work with care management team to address care coordination and chronic disease management needs related to Disease Management Educational Needs Care Coordination Mental Health Counseling Level of Care Concerns   Interventions:  Evaluation of current treatment plan related to Anxiety and Depression, Financial constraints related to insurance not approving care for the patient to go to wound care , Limited social support, Level of care concerns, ADL IADL limitations, Mental Health Concerns , Family and relationship dysfunction, Substance abuse issues -  smoker , and Inability to perform IADL's independently self-management and patient's adherence to plan as established by provider. Collaboration with  [name], NP regarding development and update of comprehensive plan of care as evidenced by provider attestation       and co-signature Inter-disciplinary care team collaboration (see longitudinal plan of care) Discussed plans with patient for ongoing care management follow up and provided patient with direct contact information for care management team Evaluation of depression and anxiety.  LCSW referral for increased stress-completed Collaborated with LCSW  Self Care Activities:  Patient verbalizes understanding of plan to effectively manage depression and anxiety Self administers medications as prescribed Attends all scheduled provider appointments Calls pharmacy for medication refills Attends church or other social activities Performs ADL's independently Performs IADL's independently Calls provider office for new concerns or questions Work with the [facility] team to meet needs for continued CCM services  Patient Goals: - activity or exercise based on tolerance encouraged - depression screen reviewed - emotional support provided - healthy lifestyle promoted - medication side effects monitored and managed - pain managed - participation in mental health treatment encouraged - quality of sleep assessed - response to mental health treatment monitored - response to pharmacologic therapy monitored - sleep hygiene techniques encouraged - social activities and relationships encouraged - substance use assessed Follow Up Plan: The care management team will reach out to the patient again over the next 30 business days.   Follow Up:  Patient agrees to Care Plan and Follow-up.  Plan: The Managed Medicaid care management team will reach out to the patient again over the next 30 business days. and The  Patient has been provided with contact information for the Managed Medicaid care management team and has  been advised to call with any health related questions or concerns.  Date/time of  next scheduled RN care management/care coordination outreach:  12/05/21 at 315.

## 2021-11-06 DIAGNOSIS — F329 Major depressive disorder, single episode, unspecified: Secondary | ICD-10-CM | POA: Diagnosis not present

## 2021-11-06 DIAGNOSIS — R32 Unspecified urinary incontinence: Secondary | ICD-10-CM | POA: Diagnosis not present

## 2021-11-06 DIAGNOSIS — F419 Anxiety disorder, unspecified: Secondary | ICD-10-CM | POA: Diagnosis not present

## 2021-11-06 DIAGNOSIS — I509 Heart failure, unspecified: Secondary | ICD-10-CM | POA: Diagnosis not present

## 2021-11-06 DIAGNOSIS — Z7985 Long-term (current) use of injectable non-insulin antidiabetic drugs: Secondary | ICD-10-CM | POA: Diagnosis not present

## 2021-11-06 DIAGNOSIS — E039 Hypothyroidism, unspecified: Secondary | ICD-10-CM | POA: Diagnosis not present

## 2021-11-06 DIAGNOSIS — Z91040 Latex allergy status: Secondary | ICD-10-CM | POA: Diagnosis not present

## 2021-11-06 DIAGNOSIS — J309 Allergic rhinitis, unspecified: Secondary | ICD-10-CM | POA: Diagnosis not present

## 2021-11-06 DIAGNOSIS — Z791 Long term (current) use of non-steroidal anti-inflammatories (NSAID): Secondary | ICD-10-CM | POA: Diagnosis not present

## 2021-11-06 DIAGNOSIS — Z833 Family history of diabetes mellitus: Secondary | ICD-10-CM | POA: Diagnosis not present

## 2021-11-06 DIAGNOSIS — I110 Hypertensive heart disease with heart failure: Secondary | ICD-10-CM | POA: Diagnosis not present

## 2021-11-06 DIAGNOSIS — M1990 Unspecified osteoarthritis, unspecified site: Secondary | ICD-10-CM | POA: Diagnosis not present

## 2021-11-06 DIAGNOSIS — Z880 Allergy status to penicillin: Secondary | ICD-10-CM | POA: Diagnosis not present

## 2021-11-06 DIAGNOSIS — Z8249 Family history of ischemic heart disease and other diseases of the circulatory system: Secondary | ICD-10-CM | POA: Diagnosis not present

## 2021-11-06 DIAGNOSIS — Z6841 Body Mass Index (BMI) 40.0 and over, adult: Secondary | ICD-10-CM | POA: Diagnosis not present

## 2021-11-06 DIAGNOSIS — Z91038 Other insect allergy status: Secondary | ICD-10-CM | POA: Diagnosis not present

## 2021-11-06 DIAGNOSIS — E1140 Type 2 diabetes mellitus with diabetic neuropathy, unspecified: Secondary | ICD-10-CM | POA: Diagnosis not present

## 2021-11-06 DIAGNOSIS — Z7951 Long term (current) use of inhaled steroids: Secondary | ICD-10-CM | POA: Diagnosis not present

## 2021-11-06 DIAGNOSIS — E559 Vitamin D deficiency, unspecified: Secondary | ICD-10-CM | POA: Diagnosis not present

## 2021-11-06 DIAGNOSIS — Z882 Allergy status to sulfonamides status: Secondary | ICD-10-CM | POA: Diagnosis not present

## 2021-11-06 DIAGNOSIS — K219 Gastro-esophageal reflux disease without esophagitis: Secondary | ICD-10-CM | POA: Diagnosis not present

## 2021-11-06 DIAGNOSIS — J449 Chronic obstructive pulmonary disease, unspecified: Secondary | ICD-10-CM | POA: Diagnosis not present

## 2021-11-07 ENCOUNTER — Other Ambulatory Visit: Payer: Self-pay

## 2021-11-07 NOTE — Patient Instructions (Signed)
Visit Information  Cheryl Chandler was given information about Medicaid Managed Care team care coordination services as a part of their [facility] Medicaid benefit. Cheryl Chandler verbally consented to engagement with the [facility] Managed Care team.   If you are experiencing a medical emergency, please call 911 or report to your local emergency department or urgent care.   If you have a non-emergency medical problem during routine business hours, please contact your provider's office and ask to speak with a nurse.   For questions related to your [facility] health plan, please call: [phone] or go here:[email or web address]  If you would like to schedule transportation through your [facility] plan, please call the following number at least 2 days in advance of your appointment: [phone].  You can also use the MTM portal or MTM mobile app to manage your rides. For the portal, please go to [email or web address]  Call the [facility] Crisis Line at [phone])010-7722, at any time, 24 hours a day, 7 days a week. If you are in danger or need immediate medical attention call 911.  If you would like help to quit smoking, call 1-800-QUIT-NOW (6175714774) OR Espaol: 1-855-Djelo-Ya (0-093-818-2993) o para ms informacin haga clic aqu or Text READY to 716-967 to register via text  Cheryl Chandler - following are the goals we discussed in your visit today:   Goals Addressed   None       Social Worker will follow up in 30-45.   Cheryl Chandler, BSW, [facility]  [place]  High Risk Managed Medicaid Team  [phone]   Following is a copy of your plan of care:  There are no care plans that you recently modified to display for this patient.

## 2021-11-07 NOTE — Patient Outreach (Signed)
Medicaid Managed Care Social Work Note  11/07/2021 Name:  Cheryl Chandler MRN:  [id number] DOB:  1965-08-27  Cheryl Chandler is an 56 y.o. year old female who is a primary patient of [name], NP.  The Medicaid Managed Care Coordination team was consulted for assistance with:  Community Resources   [name] was given information about Medicaid Managed Care Coordination team services today. [place] Patient agreed to services and verbal consent obtained.  Engaged with patient  for by telephone forfollow up visit in response to referral for case management and/or care coordination services.   Assessments/Interventions:  Review of past medical history, allergies, medications, health status, including review of consultants reports, laboratory and other test data, was performed as part of comprehensive evaluation and provision of chronic care management services.  SDOH: (Social Determinant of Health) assessments and interventions performed: BSW completed a telephone outreach with patient. She stated her [facility] nurse came out on 11/06/21 and checked her blood pressure and stated she had a slight fever,she also noticed little bits on her arm that were bits. Patient also stated a friend came over and had bugs on her on. She stated she has noticed an increase in roaches and has put out spray and bombs to help with them. Patient states that are doing ok and no resources are needed right now.   Advanced Directives Status:  Not addressed in this encounter.  Care Plan                 Allergies  Allergen Reactions   Codeine Shortness Of Breath and Rash   Percocet [Oxycodone-Acetaminophen] Shortness Of Breath   Sulfa Antibiotics Shortness Of Breath and Rash   Aspirin Hives   Bee Venom     Bee stings   Darvon [Propoxyphene]     Darvocet-N 100   Latex    Oxycodone Nausea And Vomiting   Penicillins     Has patient had a PCN reaction causing immediate rash,  facial/tongue/throat swelling, SOB or lightheadedness with hypotension: Unknown Has patient had a PCN reaction causing severe rash involving mucus membranes or skin necrosis: Unknown Has patient had a PCN reaction that required hospitalization: Unknown Has patient had a PCN reaction occurring within the last 10 years: Unknown If all of the above answers are "NO", then may proceed with Cephalosporin use.    Medications Reviewed Today     Reviewed by [name], RN (Registered Nurse) on 11/03/21 at 30  Med List Status: <None>   Medication Order Taking? Sig Documenting Provider Last Dose Status Informant  ACCU-CHEK GUIDE test strip [id number] No USE TO CHECK BLOOD SUGAR 3 TIMES DAILY AS DIRECTED [name], NP Taking Active   Accu-Chek Softclix Lancets lancets [id number] No CHECK BLOOD SUGAR TWICE DAILY [name], NP Taking Active   albuterol (PROVENTIL) (2.5 MG/3ML) 0.083% nebulizer solution [id number] No INHALE CONTENTS OF 1 VIAL IN NEBULIZER EVERY 4 HOURS AS NEEDED [name], NP Taking Active   amLODipine (NORVASC) 5 MG tablet [id number] No TAKE 1 TABLET BY MOUTH ONCE DAILY [name], NP Taking Active   atorvastatin (LIPITOR) 80 MG tablet [id number] No TAKE 1 TABLET BY MOUTH ONCE EVERY [name], NP Taking Active   Blood Glucose Monitoring Suppl (ACCU-CHEK GUIDE) w/Device KIT [id number] No Use to check blood sugar 3 times a day. [name], NP Taking Active   Blood Pressure Monitoring (OMRON 3 SERIES BP MONITOR) DEVI [id number] No Use to check blood pressure as directed  [name], NP Taking Active   citalopram (CELEXA) 40 MG tablet [id number] No Take 1 tablet (40 mg total) by mouth daily. [name], NP Taking Active   EPINEPHrine 0.3 mg/0.3 mL IJ SOAJ injection [id number] No USE AS DIRECTED. [name], NP Taking Active   furosemide (LASIX) 20 MG tablet [id number] No TAKE 1 TABLET BY MOUTH ONCE DAILY IN AFTERNOON AS NEEDED  LEG EDEMA [name], NP Taking Active   gabapentin (NEURONTIN) 300 MG capsule [id number] No TAKE 1 CAPSULE BY MOUTH 3 TIMES DAILY ASNEEDED (NEED OFFICE VISIT FOR FURTHER REFILLS) [name], NP Taking Active   hydrALAZINE (APRESOLINE) 25 MG tablet [id number] No Take 1 tablet (25 mg total) by mouth 3 (three) times daily. [name], NP Taking Active   levothyroxine (SYNTHROID) 50 MCG tablet [id number] No TAKE 1 TABLET BY MOUTH ONCE DAILY ON AN EMPTY STOMACH. WAIT 30 MINUTES BEFORE TAKING OTHER MEDS. [name], NP Taking Active   lisinopril (ZESTRIL) 40 MG tablet [id number] No TAKE 1 TABLET BY MOUTH ONCE DAILY [name], NP Taking Active   meloxicam (MOBIC) 15 MG tablet [id number] No TAKE 1 TABLET BY MOUTH ONCE DAILY AS NEEDED WITH FOOD OR MILK [name], NP Taking Active   methocarbamol (ROBAXIN) 500 MG tablet [id number] No Take 500 mg by mouth every 8 (eight) hours as needed for muscle spasms. [provider] Taking Active   mometasone-formoterol (DULERA) 100-5 MCG/ACT AERO [id number] No INHALE 2 PUFFS BY MOUTH TWICE A DAY. RINSE MOUTH AFTER USE. [name], NP Taking Active   montelukast (SINGULAIR) 10 MG tablet [id number] No TAKE 1 TABLET BY MOUTH AT BEDTIME [name], NP Taking Active   nitroGLYCERIN (NITROSTAT) 0.4 MG SL tablet [id number] No DISSOLVE 1 TABLET UNDER TONGUE AT ONSET OF CHEST PAIN. REPEAT IN 5 MIN IF NOT RESOLVED, MAX 3 DOSES. 911 IF NEEDED. [name], NP Taking Active   omeprazole (PRILOSEC) 20 MG capsule [id number] No Take 1 capsule (20 mg total) by mouth daily. [name], NP Taking Active   SPIRIVA HANDIHALER 18 MCG inhalation capsule [id number] No INHALE CONTENTS OF 1 CAPSULE ONCE DAILY [name], NP Taking Active   triamcinolone cream (KENALOG) 0.1 % [id number] No APPLY 1 APPLICATION TOPICALLY TWICE DAILY [name], [name], NP Taking Active   triamcinolone cream (KENALOG) 0.1 % [id number] No Apply 1  application. topically 2 (two) times daily. [name], NP Taking Active   TRULICITY 0.08 QP/6.1PJ [name] [id number] No INJECT 0.75MG SUBQ ONCE A [name], NP Taking Active   Vitamin D, Ergocalciferol, (DRISDOL) 1.25 MG (50000 UNIT) CAPS capsule [id number] No TAKE 1 CAPSULE BY MOUTH EVERY 7 DAYS [name], NP Taking Active             Patient Active Problem List   Diagnosis Date Noted   Foot pain, right 09/01/2021   Fibrocystic disease of both breasts 05/07/2019   IFG (impaired fasting glucose) 05/03/2019   Chronic venous insufficiency 11/09/2018   Overactive bladder 09/30/2018   Peripheral neuropathy 09/30/2018   Apnea 01/02/2016   Lymphedema 11/30/2014   Dyspnea on exertion 11/30/2014   Anxiety 11/29/2014   Arthritis 11/29/2014   COPD (chronic obstructive pulmonary disease) ([place]) 11/29/2014   Clinical depression 11/29/2014   H/O eating disorder 11/29/2014   Esophageal reflux 11/29/2014   Acne inversa 11/29/2014   Essential hypertension 11/29/2014   HLD (hyperlipidemia) 11/29/2014   Adult hypothyroidism 11/29/2014   Insomnia 11/29/2014   Low back pain 11/29/2014   Morbid obesity ([place]) 11/29/2014  Posttraumatic stress disorder 11/29/2014   Vitamin D deficiency 11/29/2014   Nicotine dependence, cigarettes, uncomplicated 03/18/6243    Conditions to be addressed/monitored per PCP order:   no resources needed at this time.  There are no care plans that you recently modified to display for this patient.   Follow up:  Patient agrees to Care Plan and Follow-up.  Plan: The Managed Medicaid care management team will reach out to the patient again over the next 30-45 days.  Date/time of next scheduled Social Work care management/care coordination outreach:  12/15/21  Mickel Fuchs, Arita Miss, [place] Medicaid Team  [phone]

## 2021-11-09 DIAGNOSIS — Z993 Dependence on wheelchair: Secondary | ICD-10-CM | POA: Diagnosis not present

## 2021-11-09 DIAGNOSIS — R3981 Functional urinary incontinence: Secondary | ICD-10-CM | POA: Diagnosis not present

## 2021-11-12 ENCOUNTER — Ambulatory Visit (INDEPENDENT_AMBULATORY_CARE_PROVIDER_SITE_OTHER): Payer: Medicaid Other | Admitting: Nurse Practitioner

## 2021-11-12 ENCOUNTER — Encounter (INDEPENDENT_AMBULATORY_CARE_PROVIDER_SITE_OTHER): Payer: Medicaid Other

## 2021-11-17 ENCOUNTER — Other Ambulatory Visit: Payer: Self-pay | Admitting: Licensed Clinical Social Worker

## 2021-11-17 NOTE — Patient Instructions (Signed)
Visit Information  Cheryl Chandler was given information about Medicaid Managed Care team care coordination services as a part of their [facility] Medicaid benefit. Cheryl Chandler verbally consented to engagement with the [facility] Managed Care team.   If you are experiencing a medical emergency, please call 911 or report to your local emergency department or urgent care.   If you have a non-emergency medical problem during routine business hours, please contact your provider's office and ask to speak with a nurse.   For questions related to your [facility] health plan, please call: [phone] or go here:[email or web address]  If you would like to schedule transportation through your [facility] plan, please call the following number at least 2 days in advance of your appointment: [phone].  You can also use the MTM portal or MTM mobile app to manage your rides. For the portal, please go to [email or web address]  Call the [facility] Crisis Line at [phone], at any time, 24 hours a day, 7 days a week. If you are in danger or need immediate medical attention call 911.  If you would like help to quit smoking, call 1-800-QUIT-NOW (863-104-7911) OR Espaol: 1-855-Djelo-Ya (2-458-099-8338) o para ms informacin haga clic aqu or Text READY to 250-539 to register via text   Following is a copy of your plan of care:  Care Plan : General Social Work (Adult)  Updates made by Gustavus Bryant, LCSW since 11/17/2021 12:00 AM     Problem: Coping Skills (General Plan of Care)      Long-Range Goal: Coping Skills Enhanced   Start Date: 11/07/2020  Expected End Date: 04/10/2021  Recent Progress: On track  Priority: High  Note:   Timeframe:  Long-Range Goal Priority:  High Start Date:       08/26/21                    Expected End Date:  ongoing  Follow up date: 01/19/22 at 1:00 pm  Current Barriers:  Financial constraints Mental Health Concerns  Social  Isolation Limited education about mental health support resources that are available to her within the local area* Cognitive Deficits Lacks knowledge of community resource  Clinical Social Work Clinical Goal(s):  Over the next 90 days, client will work with SW to address concerns related to experiencing ongoing symptoms of depression, sadness and grief since the passing of her mother Over the next 90 days, patient will work with LCSW to address needs related to implementing appropriate self-care and depression management.   Managing Loss, Adult People experience loss in many different ways throughout their lives. Events such as moving, changing jobs, and losing friends can create a sense of loss. The loss may be as serious as a major health change, divorce, death of a pet, or death of a loved one. All of these types of loss are likely to create a physical and emotional reaction known as grief. Grief is the result of a major change or an absence of something or someone that you count on. Grief is a normal reaction to loss. A variety of factors can affect your grieving experience, including: The nature of your loss. Your relationship to what or whom you lost. Your understanding of grief and how to manage it. Your support system. How to manage lifestyle changes Keep to your normal routine as much as possible. If you have trouble focusing or doing normal activities, it is acceptable to take some time away from your normal routine. Spend time with  friends and loved ones. Eat a healthy diet, get plenty of sleep, and rest when you feel tired. How to recognize changes  The way that you deal with your grief will affect your ability to function as you normally do. When grieving, you may experience these changes: Numbness, shock, sadness, anxiety, anger, denial, and guilt. Thoughts about death. Unexpected crying. A physical sensation of emptiness in your stomach. Problems sleeping and  eating. Tiredness (fatigue). Loss of interest in normal activities. Dreaming about or imagining seeing the person who died. A need to remember what or whom you lost. Difficulty thinking about anything other than your loss for a period of time. Relief. If you have been expecting the loss for a while, you may feel a sense of relief when it happens. Follow these instructions at home:    Activity Express your feelings in healthy ways, such as: Talking with others about your loss. It may be helpful to find others who have had a similar loss, such as a support group. Writing down your feelings in a journal. Doing physical activities to release stress and emotional energy. Doing creative activities like painting, sculpting, or playing or listening to music. Practicing resilience. This is the ability to recover and adjust after facing challenges. Reading some resources that encourage resilience may help you to learn ways to practice those behaviors. General instructions Be patient with yourself and others. Allow the grieving process to happen, and remember that grieving takes time. It is likely that you may never feel completely done with some grief. You may find a way to move on while still cherishing memories and feelings about your loss. Accepting your loss is a process. It can take months or longer to adjust. Keep all follow-up visits as told by your health care provider. This is important. Where to find support To get support for managing loss: Ask your health care provider for help and recommendations, such as grief counseling or therapy. Think about joining a support group for people who are managing a loss. Where to find more information You can find more information about managing loss from: American Society of Clinical Oncology: www.cancer.net American Psychological Association: DiceTournament.ca Contact a health care provider if: Your grief is extreme and keeps getting worse. You have  ongoing grief that does not improve. Your body shows symptoms of grief, such as illness. You feel depressed, anxious, or lonely. Get help right away if: You have thoughts about hurting yourself or others. If you ever feel like you may hurt yourself or others, or have thoughts about taking your own life, get help right away. You can go to your nearest emergency department or call: Your local emergency services (911 in the [place] A suicide crisis helpline, such as the National Suicide Prevention Lifeline at 639-657-6150. This is open 24 hours a day. Summary Grief is the result of a major change or an absence of someone or something that you count on. Grief is a normal reaction to loss. The depth of grief and the period of recovery depend on the type of loss and your ability to adjust to the change and process your feelings. Processing grief requires patience and a willingness to accept your feelings and talk about your loss with people who are supportive. It is important to find resources that work for you and to realize that people experience grief differently. There is not one grieving process that works for everyone in the same way. Be aware that when grief becomes extreme, it can  lead to more severe issues like isolation, depression, anxiety, or suicidal thoughts. Talk with your health care provider if you have any of these issues. This information is not intended to replace advice given to you by your health care provider. Make sure you discuss any questions you have with your health care provider. Document Revised: 06/17/2018 Document Reviewed: 08/27/2016 Elsevier Patient Education  2020 [employer].  Help for Managing Grief   When you are experiencing grief, many resources and support are available to help you. You are not alone.    Grief Share ([email or web address]://[email or web address]):    [employer] of [address] [place], [place]      [employer]  [phone] S.  [facility] [address] [place], [place]     [facility] [address] [place], [place]      [facility] [address] [place], [place]  [phone]   [facility] [address] [place], [place] [phone]   [facility] [address] [place], [place]     [phone]   [facility] [address] [place], [place]     [phone]   If a Hospice or Palliative Care team has been involved in the care of your loved one, you may reach out to your contact there or locally, you may reach out to [facility]:    [facility]   [address] [place], [place] [address] [phone] Patient was educated on healthy self-care as she admits she has little motivation to walk lately. PCS recommendation was suggested.   Patient Self Care Activities:  Attends all scheduled provider appointments Calls provider office for new concerns or questions  Please see past updates related to this goal by clicking on the "Past Updates" button in the selected goal   [name], BSW, MSW, [employer] Managed Medicaid LCSW [facility]  [facility] [email or web address] Phone: [phone]

## 2021-11-17 NOTE — Patient Outreach (Signed)
Medicaid Managed Care Social Work Note  11/17/2021 Name:  Cheryl Chandler MRN:  [id number] DOB:  1965/11/03  Cheryl Chandler is an 56 y.o. year old female who is a primary Chandler of Cheryl Billings, NP.  The Medicaid Managed Care Coordination team was consulted for assistance with:  [place] and Resources  Cheryl Chandler was given information about Medicaid Managed Care Coordination team services today. Cheryl Chandler agreed to services and verbal consent obtained.  Engaged with Chandler  for by telephone forfollow up visit in response to referral for case management and/or care coordination services.   Assessments/Interventions:  Review of past medical history, allergies, medications, health status, including review of consultants reports, laboratory and other test data, was performed as part of comprehensive evaluation and provision of chronic care management services.  SDOH: (Social Determinant of Health) assessments and interventions performed: [name]    Flowsheet Row Most Recent Value  [name]   Stress Interventions Live Life Well, [employer] Resources       Advanced Directives Status:  See Care Plan for related entries.  Care Plan                 Allergies  Allergen Reactions   Codeine Shortness Of Breath and Rash   Percocet [Oxycodone-Acetaminophen] Shortness Of Breath   Sulfa Antibiotics Shortness Of Breath and Rash   Aspirin Hives   Bee Venom     Bee stings   Darvon [Propoxyphene]     Darvocet-N 100   Latex    Oxycodone Nausea And Vomiting   Penicillins     Has Chandler had a PCN reaction causing immediate rash, facial/tongue/throat swelling, SOB or lightheadedness with hypotension: Unknown Has Chandler had a PCN reaction causing severe rash involving mucus membranes or skin necrosis: Unknown Has Chandler had a PCN reaction that required hospitalization: Unknown Has Chandler had a PCN  reaction occurring within the last 10 years: Unknown If all of the above answers are "NO", then may proceed with Cephalosporin use.    Medications Reviewed Today     Reviewed by Cheryl Cutter, LCSW (Social Worker) on 11/17/21 at 1323  Med List Status: <None>   Medication Order Taking? Sig Documenting Provider Last Dose Status Informant  ACCU-CHEK GUIDE test strip [id number] No USE TO CHECK BLOOD SUGAR 3 TIMES DAILY AS DIRECTED Cheryl Billings, NP Taking Active   Accu-Chek Softclix Lancets lancets [id number] No CHECK BLOOD SUGAR TWICE DAILY Cheryl Billings, NP Taking Active   albuterol (PROVENTIL) (2.5 MG/3ML) 0.083% nebulizer solution [id number] No INHALE CONTENTS OF 1 VIAL IN NEBULIZER EVERY 4 HOURS AS NEEDED Cheryl Billings, NP Taking Active   amLODipine (NORVASC) 5 MG tablet [id number] No TAKE 1 TABLET BY MOUTH ONCE DAILY Cheryl Billings, NP Taking Active   atorvastatin (LIPITOR) 80 MG tablet [id number] No TAKE 1 TABLET BY MOUTH ONCE EVERY Cheryl Bradley, NP Taking Active   Blood Glucose Monitoring Suppl (ACCU-CHEK GUIDE) w/Device KIT [id number] No Use to check blood sugar 3 times a day. Cheryl Chandler T, NP Taking Active   Blood Pressure Monitoring (OMRON 3 SERIES BP MONITOR) DEVI [id number] No Use to check blood pressure as directed Cheryl Billings, NP Taking Active   citalopram (CELEXA) 40 MG tablet [id number] No Take 1 tablet (40 mg total) by mouth daily. Cheryl Billings, NP Taking Active   EPINEPHrine 0.3 mg/0.3 mL IJ SOAJ injection [id number] No USE AS DIRECTED. Cheryl Billings, NP Taking Active   furosemide (LASIX) 20 MG  tablet [id number] No TAKE 1 TABLET BY MOUTH ONCE DAILY IN AFTERNOON AS NEEDED LEG EDEMA Cheryl Billings, NP Taking Active   gabapentin (NEURONTIN) 300 MG capsule [id number] No TAKE 1 CAPSULE BY MOUTH 3 TIMES DAILY ASNEEDED (NEED OFFICE VISIT FOR FURTHER REFILLS) Cheryl Billings, NP Taking Active   hydrALAZINE (APRESOLINE) 25 MG tablet [id number] No  Take 1 tablet (25 mg total) by mouth 3 (three) times daily. Cheryl Billings, NP Taking Active   levothyroxine (SYNTHROID) 50 MCG tablet [id number] No TAKE 1 TABLET BY MOUTH ONCE DAILY ON AN EMPTY STOMACH. WAIT 30 MINUTES BEFORE TAKING OTHER MEDS. Cheryl Billings, NP Taking Active   lisinopril (ZESTRIL) 40 MG tablet [id number] No TAKE 1 TABLET BY MOUTH ONCE DAILY Cheryl Billings, NP Taking Active   meloxicam (MOBIC) 15 MG tablet [id number] No TAKE 1 TABLET BY MOUTH ONCE DAILY AS NEEDED WITH FOOD OR MILK Cheryl Billings, NP Taking Active   methocarbamol (ROBAXIN) 500 MG tablet [id number] No Take 500 mg by mouth every 8 (eight) hours as needed for muscle spasms. [provider] Taking Active   mometasone-formoterol (DULERA) 100-5 MCG/ACT AERO [id number] No INHALE 2 PUFFS BY MOUTH TWICE A DAY. RINSE MOUTH AFTER USE. Cheryl Billings, NP Taking Active   montelukast (SINGULAIR) 10 MG tablet [id number] No TAKE 1 TABLET BY MOUTH AT BEDTIME Cheryl Billings, NP Taking Active   nitroGLYCERIN (NITROSTAT) 0.4 MG SL tablet [id number] No DISSOLVE 1 TABLET UNDER TONGUE AT ONSET OF CHEST PAIN. REPEAT IN 5 MIN IF NOT RESOLVED, MAX 3 DOSES. 911 IF NEEDED. Cheryl Billings, NP Taking Active   omeprazole (PRILOSEC) 20 MG capsule [id number] No Take 1 capsule (20 mg total) by mouth daily. Cheryl Billings, NP Taking Active   SPIRIVA HANDIHALER 18 MCG inhalation capsule [id number] No INHALE CONTENTS OF 1 CAPSULE ONCE DAILY Cheryl Billings, NP Taking Active   triamcinolone cream (KENALOG) 0.1 % [id number] No APPLY 1 APPLICATION TOPICALLY TWICE DAILY Chandler, Cheryl T, NP Taking Active   triamcinolone cream (KENALOG) 0.1 % [id number] No Apply 1 application. topically 2 (two) times daily. Cheryl Billings, NP Taking Active   TRULICITY 1.94 RD/4.0CX Cheryl Chandler [id number] No INJECT 0.75MG SUBQ ONCE A Cheryl Sane, NP Taking Active   Vitamin D, Ergocalciferol, (DRISDOL) 1.25 MG (50000 UNIT) CAPS capsule [id number] No  TAKE 1 CAPSULE BY MOUTH EVERY 7 DAYS Cheryl Billings, NP Taking Active             Chandler Active Problem List   Diagnosis Date Noted   Foot pain, right 09/01/2021   Fibrocystic disease of both breasts 05/07/2019   IFG (impaired fasting glucose) 05/03/2019   Chronic venous insufficiency 11/09/2018   Overactive bladder 09/30/2018   Peripheral neuropathy 09/30/2018   Apnea 01/02/2016   Lymphedema 11/30/2014   Dyspnea on exertion 11/30/2014   Anxiety 11/29/2014   Arthritis 11/29/2014   COPD (chronic obstructive pulmonary disease) ([place]) 11/29/2014   Clinical depression 11/29/2014   H/O eating disorder 11/29/2014   Esophageal reflux 11/29/2014   Acne inversa 11/29/2014   Essential hypertension 11/29/2014   HLD (hyperlipidemia) 11/29/2014   Adult hypothyroidism 11/29/2014   Insomnia 11/29/2014   Low back pain 11/29/2014   Morbid obesity ([place]) 11/29/2014   Posttraumatic stress disorder 11/29/2014   Vitamin D deficiency 11/29/2014   Nicotine dependence, cigarettes, uncomplicated 58/85/0277    Conditions to be addressed/monitored per PCP order:   Stress  Care Plan : General Social Work (Adult)  Updates made by Cheryl Cutter, LCSW since 11/17/2021 12:00 AM  Problem: Coping Skills (General Plan of Care)      Long-Range Goal: Coping Skills Enhanced   Start Date: 11/07/2020  Expected End Date: 04/10/2021  Recent Progress: On track  Priority: High  Note:   Timeframe:  Long-Range Goal Priority:  High Start Date:       08/26/21                    Expected End Date:  ongoing  Follow up date: 01/19/22 at 1:00 pm  Current Barriers:  Financial constraints Mental Health Concerns  Social Isolation Limited education about mental health support resources that are available to her within the local area* Cognitive Deficits Lacks knowledge of community resource  Clinical Social Work Clinical Goal(s):  Over the next 90 days, client will work with SW to address concerns  related to experiencing ongoing symptoms of depression, sadness and grief since the passing of her mother Over the next 90 days, Chandler will work with LCSW to address needs related to implementing appropriate self-care and depression management.   Interventions: Chandler interviewed and appropriate assessments performed Provided mental health counseling with regards to Chandler's loss and ongoing stressors. Chandler was very close with her mother who passed away a few years ago and she continues to experience symptoms of grief from this loss. Chandler denies wanting LCSW to make a referral for grief counseling through [facility] again but was receptive to coping skill and resource education/information that was provided during session today. Chandler reports decorating for the holidays helps her to connect with her mother as they did this together when she was alive.  Provided Chandler with information about managing depression within her daily life. Chandler has ongoing grief symptoms as well. Chandler states "I feel worn down." Chandler reports that she talks out loud to her mother's picture and this is very helpful to her. Grief support resource education provided again during session today.  Provided reflective listening and implemented appropriate interventions to help suppport Chandler and her emotional needs  Discussed plans with Chandler for ongoing care management follow up and provided Chandler with direct contact information for [facility] team Advised Chandler to contact [facility] team with any urgent case management needs. Assisted Chandler/caregiver with obtaining information about health plan benefits LCSW completed past referral for family to have a wheelchair ramp built at their residence due to ongoing mobility concerns. Ramp has been successfully installed.  PCS actively involved with Chandler and providing ongoing services.  Positive reinforcement provided to Chandler for quitting smoking cigarettes. Coping  skill review education provided for future triggers.  Brief self-care education provided to both Chandler and spouse. Chandler confirms that she continues to go to food pantry for food insecurity. Resource information has been provided.  Chandler reports ongoing pain and issues with mobility. Chandler shares that her pain resides in her pains and legs. Emotional support and coping skill education provided. Chandler confirms that her PCS aide continues to provide her with services. However, aide has to be reminded to put on her mask since Chandler is high risk. LCSW reviewed resources that [name] provided her with in the past.  Chandler reports that she had a wonderful birthday spent with her family this past May. Chandler reports receiving appropriate socialization. Chandler reports that her spouse took her out to eat and gifted her with several things which was a nice surprise.  Chandler confirms stable transportation arrangements for upcoming PCP appointment this week.  Chandler repots that she is losing a lot of  weight but is not sure how much she has lost as she does not have a scale. Chandler reports that a goal of hers is to lose weight but she has not been intentionally losing weight at this time.  Chandler reports that her aide [name] continues to assist her with PCS and is an excellent support within the home for her and spouse.  Chandler planted a tomato plant and successfully grew over 5 tomatoes over the summer. Chandler is very proud of this achievement as this was her first time planting a tomato plant and is now interested in picking up gardening/planting as a hobby. Chandler reports that her aunt continues to be a positive support for her in her life and someone that she can rely on in the case of emergencies.  Chandler was educated on healthy self-care skills and advised her to use these into her daily routine.  04/10/21- Update- Chandler is agreeable to social work closure at this time as she still  does not need medication management or counseling as she is managing her mental health well at this time. Chandler talks to her younger sister everyday to gain emotional support. Chandler says that her sister is into body building and helps to educate and remind her to do her own self-care. Chandler is agreeable to contact [facility] LCSW directly if any social work concern arise. [facility] LCSW will complete case closure at this time.  Chandler reports that she has been elevating her legs daily to elevate pain and swelling.  [facility] LCSW completed care coordination with Thomasene Lot on 01/15/21 and [facility] LCSW provided the following suggested resource-It is likely their best option given their loss of housing and lack of consistent income:  Publishing rights manager                                                                                                                      (Emergency [place]) [phone] Ext. 104 M-F, 8am - 4pm Chandler was successfully approved for food stamps but is still experiencing financial concerns. She reports that she and her husband have no clothes that fit them and they are having to start using a string for their pants and Chandler only has one bra at this time. [facility] LCSW will send referral to [facility] BSW today for financial support.  BSW spoke with Chandler about resources needed. Chandler states she does receive SSI and was approved for foodstamps. Chandler is familiar with all of the food pantries and other resources in [place]. BSW will assist Chandler with finding an eye doctor. Chandler states she has not been in 10 years. BSW will research and put a list of eye doctors together that accept Medicaid in [facility]. 04/30/20: BSW contacted and spoke with Chandler, she stated she was not doing good because she found out that she has a new landlord and they are going up on her rent. BSW informed Chandler to make sure she informs her social workers at [employer] that they  have an  increase for foodstamps and Medicaid. Chandler stated that she sleeps on the couch and is in need of a power recliner/lift chair. BSW will send patients PCP a message asking for a script to be sent. 05/21/21: BSW completed follow up telephone call with Chandler. She states she is unsure if their new landlord has received their January rent. Chandler states she is also worried due to the new landlord making them pay rent online and on the 1st of the month. Chandler states she is unable to get anyone on the phone. Chandler states her rent will go up to 400 from 255 in March. Chandler states she does not have many clothes but goes to [employer] to get some Materials engineer. No other resources needed at this time.   06/20/21: BSW completed follow up telelphone call with Chandler. Chandler states she is doing well. Chandler states [employer] [place] will cover all of Vaccines. Chandler states no resources are needed at this time. 08/26/21: BSW completed telephone outreach with Chandler, she stated since going to the hospital she has been feeling weak. Chandler states other than that she is feeling okay. Chandler stated she is going to get a new phone on Wednesday. Chandler states her birthday is on Friday and her husband may take her to a [place]. No other resources are needed at this time.Update- Chandler reports experiencing additional grief lately. [facility] LCSW offered referral for berievement counseling but she declined. UPDATE 08/26/21- Chandler reports that she has a near death experience last month. Chandler shares that she had a fall. 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 [facility] LCSW completed education on available mental health resources within her area to utilize. Chandler admits to experiencing some sadness due to grief from her mother's passing. Emotional support provided. 10/13/21 UPDATE- Chandler reports that she is working hard on living a healthier life both physically and mentally. Chandler reports that she is  making active changes in her diet. She reports that she eventually wishes to quit smoking again and has started picking up new hobbies to implement during times of craving such as coloring, crocheting and being creative. Chandler reports that she is unsure if she needs mental health support at this time because her main concerns are centered around her and her spouse's physical health instead their mental health. [facility] LCSW educated Chandler on how they affect one another and their importance. Chandler denies any urgent concerns. She reports no issues with affording food due to food stamp assistance. [facility] LCSW will reassess social work needs 30 days from now. UPDATE 11/17/21- Chandler reports that she is experiencing several stressors at this time. She was encouraged to consider mental health treatment but she is not interested at this time. Chandler was encouraged to consider mental health resources. Chandler reports that their car is in the shop and that they have no stable transportation besides their sister and her husband's father. Chandler reports that her spouse has had several recent health issues that have affected her mental state. Chandler was educated on healthy coping skills for stress. Chandler denies no urgent concerns or issues at this time.  Managing Loss, Adult People experience loss in many different ways throughout their lives. Events such as moving, changing jobs, and losing friends can create a sense of loss. The loss may be as serious as a major health change, divorce, death of a pet, or death of a loved one. All of these types of loss are likely to create a physical and  emotional reaction known as grief. Grief is the result of a major change or an absence of something or someone that you count on. Grief is a normal reaction to loss. A variety of factors can affect your grieving experience, including: The nature of your loss. Your relationship to what or whom you lost. Your understanding of grief  and how to manage it. Your support system. How to manage lifestyle changes Keep to your normal routine as much as possible. If you have trouble focusing or doing normal activities, it is acceptable to take some time away from your normal routine. Spend time with friends and loved ones. Eat a healthy diet, get plenty of sleep, and rest when you feel tired. How to recognize changes  The way that you deal with your grief will affect your ability to function as you normally do. When grieving, you may experience these changes: Numbness, shock, sadness, anxiety, anger, denial, and guilt. Thoughts about death. Unexpected crying. A physical sensation of emptiness in your stomach. Problems sleeping and eating. Tiredness (fatigue). Loss of interest in normal activities. Dreaming about or imagining seeing the person who died. A need to remember what or whom you lost. Difficulty thinking about anything other than your loss for a period of time. Relief. If you have been expecting the loss for a while, you may feel a sense of relief when it happens. Follow these instructions at home:    Activity Express your feelings in healthy ways, such as: Talking with others about your loss. It may be helpful to find others who have had a similar loss, such as a support group. Writing down your feelings in a journal. Doing physical activities to release stress and emotional energy. Doing creative activities like painting, sculpting, or playing or listening to music. Practicing resilience. This is the ability to recover and adjust after facing challenges. Reading some resources that encourage resilience may help you to learn ways to practice those behaviors. General instructions Be Chandler with yourself and others. Allow the grieving process to happen, and remember that grieving takes time. It is likely that you may never feel completely done with some grief. You may find a way to move on while still cherishing  memories and feelings about your loss. Accepting your loss is a process. It can take months or longer to adjust. Keep all follow-up visits as told by your health care provider. This is important. Where to find support To get support for managing loss: Ask your health care provider for help and recommendations, such as grief counseling or therapy. Think about joining a support group for people who are managing a loss. Where to find more information You can find more information about managing loss from: American Society of Clinical Oncology: www.cancer.net American Psychological Association: TVStereos.ch Contact a health care provider if: Your grief is extreme and keeps getting worse. You have ongoing grief that does not improve. Your body shows symptoms of grief, such as illness. You feel depressed, anxious, or lonely. Get help right away if: You have thoughts about hurting yourself or others. If you ever feel like you may hurt yourself or others, or have thoughts about taking your own life, get help right away. You can go to your nearest emergency department or call: Your local emergency services (911 in the [place] A suicide crisis helpline, such as the [place] at (548) 243-3172. This is open 24 hours a day. Summary Grief is the result of a major change or an absence  of someone or something that you count on. Grief is a normal reaction to loss. The depth of grief and the period of recovery depend on the type of loss and your ability to adjust to the change and process your feelings. Processing grief requires patience and a willingness to accept your feelings and talk about your loss with people who are supportive. It is important to find resources that work for you and to realize that people experience grief differently. There is not one grieving process that works for everyone in the same way. Be aware that when grief becomes extreme, it can lead to more severe  issues like isolation, depression, anxiety, or suicidal thoughts. Talk with your health care provider if you have any of these issues. This information is not intended to replace advice given to you by your health care provider. Make sure you discuss any questions you have with your health care provider. Document Revised: 06/17/2018 Document Reviewed: 08/27/2016 Elsevier Chandler Education  [place] for Managing Grief   When you are experiencing grief, many resources and support are available to help you. You are not alone.    Grief Share (https://www.https://jacobson-moore.net/):    [employer] [facility] [place], [place]  [phone] [address], [place], [place], [place], [place]   [facility] [address] [place], [place]   [place], [place]   If a Hospice or [place] team has been involved in the care of your loved one, you may reach out to your contact there or locally, you may reach out to [facility]:    Hospice and [place] of [facility]   [place],  [phone] Chandler was educated on healthy self-care as she admits she has little motivation to walk lately. PCS recommendation was suggested.   Chandler Self Care Activities:  Attends all scheduled provider appointments Calls provider office for new concerns or questions  Please see past updates related to this goal by clicking on the "Past Updates" button in the selected goal        Follow up:  Chandler agrees to Care Plan and Follow-up.  Plan: The Managed Medicaid care management team will reach out to  the Chandler again over the next 90 days.  Date/time of next scheduled Social Work care management/care coordination outreach:  01/19/22 at 1:00 pm.   Eula Fried, BSW, MSW, [place] Medicaid LCSW [email or web address] Phone: [phone]

## 2021-11-18 ENCOUNTER — Ambulatory Visit: Payer: Medicaid Other | Admitting: Nurse Practitioner

## 2021-11-18 NOTE — Progress Notes (Deleted)
   LMP 06/09/2017 (Approximate)    Subjective:    Patient ID: Cheryl Chandler, female    DOB: 1965-05-19, 56 y.o.   MRN: [id number]  HPI: Cheryl Chandler is a 56 y.o. female  No chief complaint on file.  HYPERTENSION / HYPERLIPIDEMIA Satisfied with current treatment? yes Duration of hypertension: years BP monitoring frequency: daily BP range: 100/60 BP medication side effects: no Past BP meds: lasix and lisinopril, hydralazine Duration of hyperlipidemia: years Cholesterol medication side effects: no Cholesterol supplements: none Past cholesterol medications: simvastatin (zocor) Medication compliance: excellent compliance Aspirin: no Recent stressors: no Recurrent headaches: no Visual changes: no Palpitations: no Dyspnea: yes Chest pain: no Lower extremity edema: yes Dizzy/lightheaded: no Relevant past medical, surgical, family and social history reviewed and updated as indicated. Interim medical history since our last visit reviewed. Allergies and medications reviewed and updated.  Review of Systems  Per HPI unless specifically indicated above     Objective:    LMP 06/09/2017 (Approximate)   Wt Readings from Last 3 Encounters:  07/28/21 (!) 320 lb (145.2 kg)  06/04/21 (!) 331 lb (150.1 kg)  02/05/21 290 lb (131.5 kg)    Physical Exam  Results for orders placed or performed in visit on 09/18/21  Comp Met (CMET)  Result Value Ref Range   Glucose 87 70 - 99 mg/dL   BUN 14 6 - 24 mg/dL   Creatinine, Ser 0.75 0.57 - 1.00 mg/dL   eGFR 94 >59 mL/min/1.73   BUN/Creatinine Ratio 19 9 - 23   Sodium 138 134 - 144 mmol/L   Potassium 4.5 3.5 - 5.2 mmol/L   Chloride 101 96 - 106 mmol/L   CO2 25 20 - 29 mmol/L   Calcium 8.7 8.7 - 10.2 mg/dL   Total Protein 6.6 6.0 - 8.5 g/dL   Albumin 3.6 (L) 3.8 - 4.9 g/dL   Globulin, Total 3.0 1.5 - 4.5 g/dL   Albumin/Globulin Ratio 1.2 1.2 - 2.2   Bilirubin Total 0.3 0.0 - 1.2 mg/dL   Alkaline Phosphatase 96 44 - 121  IU/L   AST 15 0 - 40 IU/L   ALT 19 0 - 32 IU/L  TSH  Result Value Ref Range   TSH 2.260 0.450 - 4.500 uIU/mL  T4, free  Result Value Ref Range   Free T4 1.16 0.82 - 1.77 ng/dL  HgB A1c  Result Value Ref Range   Hgb A1c MFr Bld 5.8 (H) 4.8 - 5.6 %   Est. average glucose Bld gHb Est-mCnc 120 mg/dL  Vitamin D (25 hydroxy)  Result Value Ref Range   Vit D, 25-Hydroxy 34.0 30.0 - 100.0 ng/mL  Lipid Profile  Result Value Ref Range   Cholesterol, Total 130 100 - 199 mg/dL   Triglycerides 99 0 - 149 mg/dL   HDL 33 (L) >39 mg/dL   VLDL Cholesterol Cal 19 5 - 40 mg/dL   LDL Chol Calc (NIH) 78 0 - 99 mg/dL   Chol/HDL Ratio 3.9 0.0 - 4.4 ratio      Assessment & Plan:   Problem List Items Addressed This Visit       Cardiovascular and Mediastinum   Essential hypertension - Primary     Follow up plan: No follow-ups on file.

## 2021-11-21 ENCOUNTER — Ambulatory Visit: Payer: Medicaid Other | Admitting: Nurse Practitioner

## 2021-11-24 ENCOUNTER — Other Ambulatory Visit: Payer: Self-pay | Admitting: Nurse Practitioner

## 2021-11-24 DIAGNOSIS — E039 Hypothyroidism, unspecified: Secondary | ICD-10-CM

## 2021-11-24 DIAGNOSIS — R059 Cough, unspecified: Secondary | ICD-10-CM

## 2021-11-24 DIAGNOSIS — R0602 Shortness of breath: Secondary | ICD-10-CM

## 2021-11-25 DIAGNOSIS — Z419 Encounter for procedure for purposes other than remedying health state, unspecified: Secondary | ICD-10-CM | POA: Diagnosis not present

## 2021-11-25 NOTE — Telephone Encounter (Signed)
Patient requesting Omperazole refill request.    Started 10/30/20 #30 12 refills.   Also asking for Epi pen refill started 09/27/20 2 each with 3 refills.   Last OV 10/22/2021 upcoming scheduled OV for 12/31/21.

## 2021-11-25 NOTE — Telephone Encounter (Signed)
Requested Prescriptions  Pending Prescriptions Disp Refills  . citalopram (CELEXA) 40 MG tablet [Pharmacy Med Name: CITALOPRAM HYDROBROMIDE 40 MG TAB] 90 tablet 1    Sig: TAKE 1 TABLET BY MOUTH ONCE DAILY     Psychiatry:  Antidepressants - SSRI Passed - 11/24/2021 10:37 AM      Passed - Completed PHQ-2 or PHQ-9 in the last 360 days      Passed - Valid encounter within last 6 months    Recent Outpatient Visits          1 month ago Essential hypertension   [facility] [name], NP   2 months ago Essential hypertension   [name], [facility]   4 months ago Essential hypertension   [facility] [name], NP   5 months ago Annual physical exam   [facility] [name], NP   11 months ago Essential hypertension   [facility] [place], Oralia Rud, DO      Future Appointments            In 1 week Agbor-Etang, Arlys John, MD [facility], [facility]   In 1 month [name], NP [facility], [facility]           . levothyroxine (SYNTHROID) 50 MCG tablet [Pharmacy Med Name: LEVOTHYROXINE SODIUM 50 MCG TAB] 90 tablet 1    Sig: TAKE 1 TABLET BY MOUTH ONCE DAILY ON AN EMPTY STOMACH. WAIT 30 MINUTES BEFORE TAKING OTHER MEDS.     Endocrinology:  Hypothyroid Agents Passed - 11/24/2021 10:37 AM      Passed - TSH in normal range and within 360 days    TSH  Date Value Ref Range Status  09/18/2021 2.260 0.450 - 4.500 uIU/mL Final         Passed - Valid encounter within last 12 months    Recent Outpatient Visits          1 month ago Essential hypertension   [facility] [name], NP   2 months ago Essential hypertension   [facility] [name], NP   4 months ago Essential hypertension   [facility] [name], NP   5 months ago Annual physical exam   [facility] [name], NP   11  months ago Essential hypertension   [facility] Dorcas Carrow, DO      Future Appointments            In 1 week Agbor-Etang, Arlys John, MD [facility], [facility]   In 1 month [name], NP [facility], [facility]           . omeprazole (PRILOSEC) 20 MG capsule [Pharmacy Med Name: OMEPRAZOLE DR 20 MG CAP] 30 capsule 12    Sig: TAKE 1 CAPSULE BY MOUTH ONCE DAILY     Gastroenterology: Proton Pump Inhibitors Passed - 11/24/2021 10:37 AM      Passed - Valid encounter within last 12 months    Recent Outpatient Visits          1 month ago Essential hypertension   [facility] [name], NP   2 months ago Essential hypertension   [facility] [name], NP   4 months ago Essential hypertension   [facility] [name], NP   5 months ago Annual physical exam   [facility] [name], NP   11 months ago Essential hypertension   [facility] Dorcas Carrow, DO      Future Appointments            In 1 week Agbor-Etang, Arlys John, MD [facility], [facility]   In 1 month [name], NP [facility], [facility]           . EPINEPHrine 0.3 mg/0.3 mL IJ SOAJ injection [Pharmacy Med Name: EPINEPHRINE 0.3 MG/0.3ML INJ SOLN] 2 each 3    Sig: INJECT 1 SYRINGE INTO OUTER THIGH ONCE AS NEEDED FOR SEVERE ALLERGIC REACTION.     Immunology: Antidotes Passed - 11/24/2021 10:37 AM      Passed - Valid encounter within last 12 months    Recent Outpatient Visits          1 month ago Essential hypertension   [facility] [name], NP   2 months ago Essential hypertension   [facility] [name], NP   4 months ago Essential hypertension   [facility] [name], NP   5 months ago Annual physical exam   [facility] [name], NP   11 months ago Essential  hypertension   [facility] Dorcas Carrow, DO      Future Appointments            In 1 week Agbor-Etang, Arlys John, MD [facility], [facility]   In 1 month [name], NP [facility], [facility]

## 2021-11-25 NOTE — Telephone Encounter (Signed)
Requested medication (s) are due for refill today expired Rx  Requested medication (s) are on the active medication list -yes  Future visit scheduled -yes  Last refill: Omeprazole 10/30/20 #30 12RF                  Epinephrine injection 09/27/20  2 each 3RF  Notes to clinic: expired Rx  Requested Prescriptions  Pending Prescriptions Disp Refills   omeprazole (PRILOSEC) 20 MG capsule [Pharmacy Med Name: OMEPRAZOLE DR 20 MG CAP] 30 capsule 12    Sig: TAKE 1 CAPSULE BY MOUTH ONCE DAILY     Gastroenterology: Proton Pump Inhibitors Passed - 11/24/2021 10:37 AM      Passed - Valid encounter within last 12 months    Recent Outpatient Visits           1 month ago Essential hypertension   [facility] [name], NP   2 months ago Essential hypertension   [facility] [name], NP   4 months ago Essential hypertension   [facility] [name], NP   5 months ago Annual physical exam   [name]   11 months ago Essential hypertension   [facility] [place], Oralia Rud, DO       Future Appointments             In 1 week Agbor-Etang, Arlys John, MD [facility], [facility]   In 1 month [name], NP [facility], [facility]             EPINEPHrine 0.3 mg/0.3 mL IJ SOAJ injection [Pharmacy Med Name: EPINEPHRINE 0.3 MG/0.3ML INJ SOLN] 2 each 3    Sig: INJECT 1 SYRINGE INTO OUTER THIGH ONCE AS NEEDED FOR SEVERE ALLERGIC REACTION.     Immunology: Antidotes Passed - 11/24/2021 10:37 AM      Passed - Valid encounter within last 12 months    Recent Outpatient Visits           1 month ago Essential hypertension   [facility] [name], NP   2 months ago Essential hypertension   [facility] [name], NP   4 months ago Essential hypertension   [facility] [name], NP   5 months ago Annual  physical exam   [facility] [name], NP   11 months ago Essential hypertension   [facility] [place], Oralia Rud, DO       Future Appointments             In 1 week Agbor-Etang, Arlys John, MD [facility], [facility]   In 1 month [name], NP [facility], [facility]            Signed Prescriptions Disp Refills   citalopram (CELEXA) 40 MG tablet 90 tablet 1    Sig: TAKE 1 TABLET BY MOUTH ONCE DAILY     Psychiatry:  Antidepressants - SSRI Passed - 11/24/2021 10:37 AM      Passed - Completed PHQ-2 or PHQ-9 in the last 360 days      Passed - Valid encounter within last 6 months    Recent Outpatient Visits           1 month ago Essential hypertension   [facility] [name], NP   2 months ago Essential hypertension   [facility] [name], NP   4 months ago Essential hypertension   [facility] [place],  [name], NP   5 months ago Annual physical exam   [facility] [name], NP   11 months ago Essential hypertension   [facility] [place], Oralia Rud, DO       Future Appointments             In 1 week Agbor-Etang, Arlys John, MD [facility], [facility]   In 1 month [name], NP [facility], [facility]             levothyroxine (SYNTHROID) 50 MCG tablet 90 tablet 1    Sig: TAKE 1 TABLET BY MOUTH ONCE DAILY ON AN EMPTY STOMACH. WAIT 30 MINUTES BEFORE TAKING OTHER MEDS.     Endocrinology:  Hypothyroid Agents Passed - 11/24/2021 10:37 AM      Passed - TSH in normal range and within 360 days    TSH  Date Value Ref Range Status  09/18/2021 2.260 0.450 - 4.500 uIU/mL Final         Passed - Valid encounter within last 12 months    Recent Outpatient Visits           1 month ago Essential hypertension   [facility] [name], NP   2 months ago Essential hypertension    [facility] [name], NP   4 months ago Essential hypertension   [facility] [name], NP   5 months ago Annual physical exam   [facility] [name], NP   11 months ago Essential hypertension   [facility] Dorcas Carrow, DO       Future Appointments             In 1 week Agbor-Etang, Arlys John, MD [facility], [facility]   In 1 month [name], NP [facility], [facility]               Requested Prescriptions  Pending Prescriptions Disp Refills   omeprazole (PRILOSEC) 20 MG capsule [Pharmacy Med Name: OMEPRAZOLE DR 20 MG CAP] 30 capsule 12    Sig: TAKE 1 CAPSULE BY MOUTH ONCE DAILY     Gastroenterology: Proton Pump Inhibitors Passed - 11/24/2021 10:37 AM      Passed - Valid encounter within last 12 months    Recent Outpatient Visits           1 month ago Essential hypertension   [facility] [name], NP   2 months ago Essential hypertension   [facility] [name], NP   4 months ago Essential hypertension   [facility] [name], NP   5 months ago Annual physical exam   [facility] [name], NP   11 months ago Essential hypertension   [facility] [place], Oralia Rud, DO       Future Appointments             In 1 week Agbor-Etang, Arlys John, MD [facility], [facility]   In 1 month [name], NP [facility], [facility]             EPINEPHrine 0.3 mg/0.3 mL IJ SOAJ injection [Pharmacy Med Name: EPINEPHRINE 0.3 MG/0.3ML INJ SOLN] 2 each 3    Sig: INJECT 1 SYRINGE INTO OUTER THIGH ONCE AS NEEDED FOR SEVERE ALLERGIC REACTION.     Immunology: Antidotes Passed - 11/24/2021 10:37 AM      Passed - Valid encounter within last 12 months    Recent Outpatient Visits  1 month ago Essential hypertension   [facility]  [name], NP   2 months ago Essential hypertension   [facility] [name], NP   4 months ago Essential hypertension   [facility] [name], NP   5 months ago Annual physical exam   [facility] [name], NP   11 months ago Essential hypertension   [facility] Dorcas Carrow, DO       Future Appointments             In 1 week Agbor-Etang, Arlys John, MD [facility], [facility]   In 1 month [name], NP [facility], [facility]            Signed Prescriptions Disp Refills   citalopram (CELEXA) 40 MG tablet 90 tablet 1    Sig: TAKE 1 TABLET BY MOUTH ONCE DAILY     Psychiatry:  Antidepressants - SSRI Passed - 11/24/2021 10:37 AM      Passed - Completed PHQ-2 or PHQ-9 in the last 360 days      Passed - Valid encounter within last 6 months    Recent Outpatient Visits           1 month ago Essential hypertension   [facility] [name], NP   2 months ago Essential hypertension   [facility] [name], NP   4 months ago Essential hypertension   [facility] [name], NP   5 months ago Annual physical exam   [facility] [name], NP   11 months ago Essential hypertension   [facility] [place], Oralia Rud, DO       Future Appointments             In 1 week Agbor-Etang, Arlys John, MD [facility], [facility]   In 1 month [name], NP [facility], [facility]             levothyroxine (SYNTHROID) 50 MCG tablet 90 tablet 1    Sig: TAKE 1 TABLET BY MOUTH ONCE DAILY ON AN EMPTY STOMACH. WAIT 30 MINUTES BEFORE TAKING OTHER MEDS.     Endocrinology:  Hypothyroid Agents Passed - 11/24/2021 10:37 AM      Passed - TSH in normal range and within 360 days    TSH  Date Value Ref Range Status  09/18/2021 2.260 0.450 - 4.500 uIU/mL Final          Passed - Valid encounter within last 12 months    Recent Outpatient Visits           1 month ago Essential hypertension   [facility] [name], NP   2 months ago Essential hypertension   [facility] [name], NP   4 months ago Essential hypertension   [facility] [name], NP   5 months ago Annual physical exam   [facility] [name], NP   11 months ago Essential hypertension   [facility] Dorcas Carrow, DO       Future Appointments             In 1 week Agbor-Etang, Arlys John, MD [facility], [facility]   In 1 month [name], NP [facility], [facility]

## 2021-11-25 NOTE — Telephone Encounter (Signed)
Requested Prescriptions  Pending Prescriptions Disp Refills  . nitroGLYCERIN (NITROSTAT) 0.4 MG SL tablet [Pharmacy Med Name: NITROGLYCERIN 0.4 MG SL TAB] 25 tablet 1    Sig: DISSOLVE 1 TABLET UNDER TONGUE AT ONSET OF CHEST PAIN. REPEAT IN 5 MIN IF NOT RESOLVED, MAX 3 DOSES. 911 IF NEEDED.     Cardiovascular:  Nitrates Failed - 11/24/2021 11:16 AM      Failed - Last BP in normal range    BP Readings from Last 1 Encounters:  10/22/21 (!) 149/71         Passed - Last Heart Rate in normal range    Pulse Readings from Last 1 Encounters:  10/22/21 63         Passed - Valid encounter within last 12 months    Recent Outpatient Visits          1 month ago Essential hypertension   [facility] [name], NP   2 months ago Essential hypertension   [facility] [name], NP   4 months ago Essential hypertension   [facility] [name], NP   5 months ago Annual physical exam   [facility] [name], NP   11 months ago Essential hypertension   [facility] [place], Oralia Rud, DO      Future Appointments            In 1 week Agbor-Etang, Arlys John, MD [facility], [facility]   In 1 month [name], NP [facility], [facility]           . albuterol (PROVENTIL) (2.5 MG/3ML) 0.083% nebulizer solution [Pharmacy Med Name: ALBUTEROL SULFATE (2.5 MG/3ML) 0.08] 225 mL 0    Sig: USE 1 VIAL VIA NEBULIZER EVERY 4 HOURS AS NEEDED     Pulmonology:  Beta Agonists 2 Failed - 11/24/2021 11:16 AM      Failed - Last BP in normal range    BP Readings from Last 1 Encounters:  10/22/21 (!) 149/71         Passed - Last Heart Rate in normal range    Pulse Readings from Last 1 Encounters:  10/22/21 63         Passed - Valid encounter within last 12 months    Recent Outpatient Visits          1 month ago Essential hypertension   [facility] [name], NP   2  months ago Essential hypertension   [facility] [name], NP   4 months ago Essential hypertension   [facility] [name], NP   5 months ago Annual physical exam   [facility] [name], NP   11 months ago Essential hypertension   [facility] [place], Oralia Rud, DO      Future Appointments            In 1 week Agbor-Etang, Arlys John, MD [facility], [facility]   In 1 month [name], NP [facility], [facility]

## 2021-12-02 ENCOUNTER — Ambulatory Visit: Payer: Medicaid Other | Admitting: Cardiology

## 2021-12-02 ENCOUNTER — Telehealth: Payer: Self-pay | Admitting: Nurse Practitioner

## 2021-12-02 NOTE — Telephone Encounter (Signed)
Call to pharmacy: Insurance requires brand name Rx for EPI pen. The brand name is in short supply- pharmacy is going to check and let office know if they can get this for patient. Patient notified - she is going to follow up with pharmacy as well.

## 2021-12-02 NOTE — Telephone Encounter (Signed)
Medication Refill - Medication: EPINEPHrine 0.3 mg/0.3 mL IJ SOAJ injection  Pt stated her husband went to pick it up and they did not have it.  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  Has the patient contacted their pharmacy? Yes.    (Agent: If yes, when and what did the pharmacy advise?)  Preferred Pharmacy (with phone number or street name):  [facility] [place] [address]  [address] [place] [address]  Phone: [phone] Fax: [phone]  Hours: Not open 24 hours   Has the patient been seen for an appointment in the last year OR does the patient have an upcoming appointment? Yes.    Agent: Please be advised that RX refills may take up to 3 business days. We ask that you follow-up with your pharmacy.

## 2021-12-03 MED ORDER — EPINEPHRINE 0.3 MG/0.3ML IJ SOAJ: INTRAMUSCULAR | 3 refills | Status: DC

## 2021-12-03 NOTE — Telephone Encounter (Signed)
Brand name Epi pen sent to the pharmacy.

## 2021-12-03 NOTE — Telephone Encounter (Signed)
Left detailed message for patient regarding brand name epi pen sent to pharmacy.

## 2021-12-05 ENCOUNTER — Other Ambulatory Visit: Payer: Self-pay | Admitting: Obstetrics and Gynecology

## 2021-12-05 ENCOUNTER — Other Ambulatory Visit: Payer: Self-pay | Admitting: Pharmacist

## 2021-12-05 DIAGNOSIS — I10 Essential (primary) hypertension: Secondary | ICD-10-CM

## 2021-12-05 MED ORDER — SPIRONOLACTONE 25 MG PO TABS: 12.5000 mg | ORAL_TABLET | Freq: Every day | ORAL | 1 refills | Status: DC

## 2021-12-05 NOTE — Patient Instructions (Signed)
Visit Information  Cheryl Chandler was given information about Medicaid Managed Care team care coordination services as a part of their [facility] Medicaid benefit. Cheryl Chandler verbally consented to engagement with the [facility] Managed Care team.   If you are experiencing a medical emergency, please call 911 or report to your local emergency department or urgent care.   If you have a non-emergency medical problem during routine business hours, please contact your provider's office and ask to speak with a nurse.   For questions related to your [facility] health plan, please call: [phone] or go here:[email or web address]  If you would like to schedule transportation through your [facility] plan, please call the following number at least 2 days in advance of your appointment: [phone].  You can also use the MTM portal or MTM mobile app to manage your rides. For the portal, please go to [email or web address]  Call the [facility] Crisis Line at [phone], at any time, 24 hours a day, 7 days a week. If you are in danger or need immediate medical attention call 911.  If you would like help to quit smoking, call 1-800-QUIT-NOW (617-064-4094) OR Espaol: 1-855-Djelo-Ya (6-811-572-6203) o para ms informacin haga clic aqu or Text READY to 559-741 to register via text  Cheryl Chandler - following are the goals we discussed in your visit today:   Goals Addressed             This Visit's Progress    RNCM: Keep Skin Clean and Dry       Follow Up Date: 01/14/22  - clean and dry skin well - keep skin dry - use a fragrance-free lotion on skin - wear a protective pad or garment    Why is this important?   Leaking urine (pee) can cause soreness from skin rashes and redness.  This is caused by skin being exposed to urine (pee).    12/05/21:  no complaints today     RNCM: Track and Manage My Symptoms-Depression       Timeframe:  Long-Range Goal Priority:   High Start Date:    12-02-2020                         Expected End Date:   ongoing                Follow Up Date: 01/14/22   - avoid negative self-talk - develop a personal safety plan - develop a plan to deal with triggers like holidays, anniversaries - exercise at least 2 to 3 times per week - have a plan for how to handle bad days - journal feelings and what helps to feel better or worse - spend time or talk with others at least 2 to 3 times per week - spend time or talk with others every day - watch for early signs of feeling worse    Why is this important?   Keeping track of your progress will help your treatment team find the right mix of medicine and therapy for you.  Write in your journal every day.  Day-to-day changes in depression symptoms are normal. It may be more helpful to check your progress at the end of each week instead of every day.    12/05/21: LCSW and BSW continue to follow     RNCM: Track and Manage My Triggers-COPD       Timeframe:  Long-Range Goal Priority:  Medium Start Date:     05-22-2020  Expected End Date:   ongoing          Follow Up Date: 01/14/22   - avoid second hand smoke - eliminate smoking in my home - identify and avoid work-related triggers - identify and remove indoor air pollutants - limit outdoor activity during cold weather - listen for public air quality announcements every day    Why is this important?   Triggers are activities or things, like tobacco smoke or cold weather, that make your COPD (chronic obstructive pulmonary disease) flare-up.  Knowing these triggers helps you plan how to stay away from them.  When you cannot remove them, you can learn how to manage them.    12/05/21:  SOB unchanged, Oxygen saturations 95% today   The patient verbalized understanding of instructions, educational materials, and care plan provided today and agreed to receive a mailed copy of patient instructions, educational  materials, and care plan.   The Managed Medicaid care management team will reach out to the patient again over the next 30 business  days.  The  Patient  has been provided with contact information for the Managed Medicaid care management team and has been advised to call with any health related questions or concerns.   Kathi Dererri Kelijah Towry RN, BSN [place]  [facility] Care Management Coordinator - Managed Medicaid High Risk [phone]   Following is a copy of your plan of care:   Care Plan : RNCM: COPD (Adult)  Updates made by Danie Chandlerraft, Ember Gottwald G, RN since 12/05/2021 12:00 AM   Long-Range Goal: RNCM: Adjustment to Disease Achieved   Start Date: 05/22/2020  Expected End Date: 02/03/2022  Recent Progress: On track  Priority: High  Note:   Current Barriers:  Knowledge deficits related to basic understanding of COPD disease process Knowledge deficits related to basic COPD self care/management Knowledge deficit related to basic understanding of how to use inhalers and how inhaled medications work Knowledge deficit related to importance of energy conservation Limited Social Support Unable to independently manage COPD Lacks social connections Does not contact provider office for questions/concerns  12/05/21:  SOB unchanged, Oxygen saturations 95% today, smokes 2-3 cigarettes a day Case Manager Clinical Goal(s): patient will report using inhalers as prescribed including rinsing mouth after use patient will be able to verbalize understanding of COPD action plan and when to seek appropriate levels of medical care  patient will engage in lite exercise as tolerated to build/regain stamina and strength and reduce shortness of breath through activity tolerance  patient will verbalize basic understanding of COPD disease process and self care activities  Interventions:  Collaboration with Larae GroomsHoldsworth, Karen, NP regarding development and update of comprehensive plan of care as evidenced by  provider attestation and co-signature Inter-disciplinary care team collaboration (see longitudinal plan of care) UNABLE to independently: manage COPD Provided patient with basic written and verbal COPD education on self care/management/and exacerbation prevention Provided patient with COPD action plan and reinforced importance of daily self assessment.  Provided written and verbal instructions on pursed lip breathing and utilized returned demonstration as teach back Provided instruction about proper use of medications used for management of COPD including inhalers Advised patient to self assesses COPD action plan zone and make appointment with provider if in the yellow zone for 48 hours without improvement. Provided patient with education about the role of exercise in the management of COPD Advised patient to engage in light exercise as tolerated 3-5 days a week Provided education about and advised patient to utilize infection  prevention strategies to reduce risk of respiratory infection. Patient Goals/Self-Care Activities:  - decision-making supported - depression screen reviewed - emotional support provided - family involvement promoted - problem-solving facilitated - relaxation techniques promoted - verbalization of feelings encouraged Follow Up Plan: Telephone follow up appointment with care management team member scheduled.   Problem: RNCM: Symptom Exacerbation (COPD)   Priority: High  Onset Date: 05/22/2020     Long-Range Goal: RNCM: Smoking Cessation Symptom Exacerbation Prevented or Minimized   Start Date: 05/22/2020  Expected End Date: 03/07/2022  Recent Progress: Not on track  Priority: High  Note:   Current Barriers:  Unable to independently stop smoking  Does not adhere to provider recommendations re: smoking cessation  Lacks social connections Does not contact provider office for questions/concerns Tobacco abuse of >40 years; trying to stop-says she has  stopped Previous quit attempts, unsuccessful several successful using patches, gum- has a pattern of stopping and then starting back  Reports smoking within 30 minutes of waking up Reports triggers to smoke include: stress, chronic conditions  Reports motivation to quit smoking includes: knows it will help her breathing and other chronic conditions  On a scale of 1-10, reports MOTIVATION to quit is 8 On a scale of 1-10, reports CONFIDENCE in quitting is 7 12/05/21:  Patient continues to try and quit smoking-2-3 cigarettes a day  Clinical Goal(s):  patient will work with RN Case Solicitor and provider towards tobacco cessation  Interventions: Collaboration with [name], NP regarding development and update of comprehensive plan of care as evidenced by provider attestation and co-signature Inter-disciplinary care team collaboration (see longitudinal plan of care) Evaluation of current treatment plan reviewed.  Provided contact information for [place] [facility]uit Line (1-800-QUIT-NOW). Patient will outreach this group for support. Update 01/22/21:  Patient states she has utilized [employer] for resources. Discussed plans with patient for ongoing care management follow up and provided patient with direct contact information for care management team Provided patient with printed smoking cessation educational materials Provided contact information for [place] Quit Line (1-800-QUIT-NOW). Patient will outreach this group for support. Praised patient for decreasing smoking- continued encouragement given. Patient Goals/Self-Care Activities patient will:  - mindfulness, talking to family and friends as a habit during cravings  - verbally commit to reducing tobacco consumption - barriers to lifestyle changes reviewed and addressed - barriers to treatment reviewed and addressed - breathing techniques encouraged - modification of home and work environment promoted - rescue  (action) plan reviewed - signs/symptoms of infection reviewed - signs/symptoms of worsening disease assessed - treatment plan reviewed Follow Up Plan: Telephone follow up appointment with care management team member scheduled.    Care Plan : RNCM: Hypertension (Adult)  Updates made by [name], RN since 12/05/2021 12:00 AM     Problem: RNCM: Hypertension (Hypertension)   Priority: Medium  Onset Date: 05/22/2020     Long-Range Goal: RNCM: Hypertension Monitored   Start Date: 05/22/2020  Expected End Date: 03/07/2022  Recent Progress: On track  Priority: Medium  Note:    Current Barriers:  Knowledge Deficits related to basic understanding of hypertension pathophysiology and self care management Knowledge Deficits related to understanding of medications prescribed for management of hypertension Limited Social Support Unable to independently manage HTN Lacks social connections Does not contact provider office for questions/concern 12/05/21:  Hard to ascertain exact BP readings from patient, 135-185/50-65 per patient. Case Manager Clinical Goal(s):   patient will verbalize understanding of plan for hypertension  management patient will demonstrate improved adherence to prescribed treatment plan for hypertension as evidenced by taking all medications as prescribed, monitoring and recording blood pressure as directed, adhering to low sodium/DASH diet patient will demonstrate improved health management independence as evidenced by checking blood pressure as directed and notifying PCP if SBP>160 or DBP > 90, taking all medications as prescribe, and adhering to a low sodium diet as discussed. Interventions:  Collaboration with [name], NP regarding development and update of comprehensive plan of care as evidenced by provider attestation and co-signature Inter-disciplinary care team collaboration (see longitudinal plan of care) Evaluation of current treatment plan related to  hypertension self management and patient's adherence to plan as established by provider.  Provided education to patient re: stroke prevention, s/s of heart attack and stroke, DASH diet, complications of uncontrolled blood pressure Reviewed medications with patient and discussed importance of compliance.  Discussed plans with patient for ongoing care management follow up and provided patient with direct contact information for care management team Advised patient, providing education and rationale, to monitor blood pressure daily and record, calling PCP for findings outside established parameters.  Patient Goals/Self-Care Activities Over the next 120 days, patient will:  - Self administers medications as prescribed Attends all scheduled provider appointments Calls provider office for new concerns, questions, or BP outside discussed parameters Checks BP and records as discussed Follows a low sodium diet/DASH diet - blood pressure trends reviewed - depression screen reviewed - home or ambulatory blood pressure monitoring encouraged Follow Up Plan: Telephone follow up appointment with care management team member scheduled.     Care Plan : RNCM: Depression (Adult)  Updates made by [name], RN since 12/05/2021 12:00 AM     Problem: RNCM: Symptoms (Depression)   Priority: High  Onset Date: 12/02/2020     Long-Range Goal: RNCM: Symptoms Monitored and Managed   Start Date: 12/02/2020  Expected End Date: 02/04/2023  Recent Progress: Not on track  Priority: High  Note:   Current Barriers:  Ineffective Self Health Maintenance in a patient with Anxiety and Depression Unable to independently manage depression and anxiety as evidence of multiple chronic conditions and the patient feeling her health is out of control  Does not attend all scheduled provider appointments Lacks social connections Unable to perform IADLs independently Does not contact provider office for  questions/concerns 12/05/21:  LCSW and BSW follow-has follow up appts, no complaints today Clinical Goal(s):  Collaboration with [name], NP regarding development and update of comprehensive plan of care as evidenced by provider attestation and co-signature Inter-disciplinary care team collaboration (see longitudinal plan of care) patient will work with care management team to address care coordination and chronic disease management needs related to Disease Management Educational Needs Care Coordination Mental Health Counseling Level of Care Concerns   Interventions:  Evaluation of current treatment plan related to Anxiety and Depression, Financial constraints related to insurance not approving care for the patient to go to wound care , Limited social support, Level of care concerns, ADL IADL limitations, Mental Health Concerns , Family and relationship dysfunction, Substance abuse issues -  smoker , and Inability to perform IADL's independently self-management and patient's adherence to plan as established by provider. Collaboration with [name], NP regarding development and update of comprehensive plan of care as evidenced by provider attestation       and co-signature Inter-disciplinary care team collaboration (see longitudinal plan of care) Discussed plans with patient for ongoing care management follow up and provided  patient with direct contact information for care management team Evaluation of depression and anxiety.  LCSW referral for increased stress-completed Collaborated with LCSW  Self Care Activities:  Patient verbalizes understanding of plan to effectively manage depression and anxiety Self administers medications as prescribed Attends all scheduled provider appointments Calls pharmacy for medication refills Attends church or other social activities Performs ADL's independently Performs IADL's independently Calls provider office for new concerns or  questions Work with the [facility] team to meet needs for continued CCM services  Patient Goals: - activity or exercise based on tolerance encouraged - depression screen reviewed - emotional support provided - healthy lifestyle promoted - medication side effects monitored and managed - pain managed - participation in mental health treatment encouraged - quality of sleep assessed - response to mental health treatment monitored - response to pharmacologic therapy monitored - sleep hygiene techniques encouraged - social activities and relationships encouraged - substance use assessed Follow Up Plan: The care management team will reach out to the patient again over the next 30 business days.

## 2021-12-05 NOTE — Chronic Care Management (AMB) (Signed)
Chief Complaint  Patient presents with   Hypertension    Cheryl Chandler is a 56 y.o. year old female who presented for a telephone visit.   They were referred to the pharmacist by their High Risk Managed Medicaid Care Team  for assistance in managing complex medication management.   Patient is participating in a Managed Medicaid Plan:  Yes  Subjective:  Care Team: Primary Care Provider: [name], NP ; Next Scheduled Visit: 12/31/21 Cardiologist: Garen Lah; Next Scheduled Visit: 01/30/22 Pulmonary: Patsey Berthold: Next Scheduled Visit: 02/02/22  Medication Access/Adherence  Current Pharmacy:  [place], [place] [address] Phone: [phone] Fax: [phone]   Patient reports affordability concerns with their medications: No  Patient reports access/transportation concerns to their pharmacy: No  Patient reports adherence concerns with their medications:  No     Hypertension:  Current medications: amlodipine 5 mg daily, hydralazine 25 mg twice daily, lisinopril 40 mg daily   Patient has a validated, automated, upper arm home BP cuff Current blood pressure readings readings: 160-170s/70-70, HR 60-70s  Reports some "chest pain" lately that is not new, coincides with stress. Resolves with rest and 1 NTG. Reports she rescheduled cardiology due to not having the copay at the time. Not scheduled until September.   Patient denies hypotensive s/sx including dizziness, lightheadedness.  Patient reports hypertensive symptoms including chest pain (comes on with stress, resolves with rest and 1 NTG), shortness of breath (though no worse than baseline)   Health Maintenance  Health Maintenance Due  Topic Date Due   PAP SMEAR-Modifier  Never done   MAMMOGRAM  Never done   COVID-19 Vaccine (4 - Moderna risk series) 03/18/2020   Zoster Vaccines- Shingrix (2 of 2) 11/13/2021   INFLUENZA VACCINE  11/25/2021      Objective: Lab Results  Component Value Date   HGBA1C 5.8 (H) 09/18/2021    Lab Results  Component Value Date   CREATININE 0.75 09/18/2021   BUN 14 09/18/2021   NA 138 09/18/2021   K 4.5 09/18/2021   CL 101 09/18/2021   CO2 25 09/18/2021    Lab Results  Component Value Date   CHOL 130 09/18/2021   HDL 33 (L) 09/18/2021   LDLCALC 78 09/18/2021   TRIG 99 09/18/2021   CHOLHDL 3.9 09/18/2021    Medications Reviewed Today     Reviewed by Osker Mason, RPH-CPP (Pharmacist) on 12/05/21 at 1551  Med List Status: <None>   Medication Order Taking? Sig Documenting Provider Last Dose Status Informant  ACCU-CHEK GUIDE test strip [id number]  USE TO CHECK BLOOD SUGAR 3 TIMES DAILY AS DIRECTED [name], NP  Active   Accu-Chek Softclix Lancets lancets [id number]  CHECK BLOOD SUGAR TWICE DAILY [name], NP  Active   albuterol (PROVENTIL) (2.5 MG/3ML) 0.083% nebulizer solution [id number]  USE 1 VIAL VIA NEBULIZER EVERY 4 HOURS AS NEEDED [name], NP  Active   amLODipine (NORVASC) 5 MG tablet [id number] Yes TAKE 1 TABLET BY MOUTH ONCE DAILY [name], NP Taking Active   atorvastatin (LIPITOR) 80 MG tablet [id number]  TAKE 1 TABLET BY MOUTH ONCE EVERY [name], NP  Active   Blood Glucose Monitoring Suppl (ACCU-CHEK GUIDE) w/Device KIT [id number]  Use to check blood sugar 3 times a day. [name], NP  Active   Blood Pressure Monitoring (OMRON 3 SERIES BP MONITOR) DEVI [id number]  Use to check blood pressure as directed [name],  NP  Active   citalopram (CELEXA) 40 MG tablet [id number]  TAKE 1 TABLET BY MOUTH ONCE DAILY [name], NP  Active   EPINEPHrine 0.3 mg/0.3 mL IJ SOAJ injection [id number]  INJECT 1 SYRINGE INTO OUTER THIGH ONCE AS NEEDED FOR SEVERE ALLERGIC REACTION. [name], NP  Active   furosemide (LASIX) 20 MG tablet [id number]  TAKE 1 TABLET BY MOUTH ONCE DAILY IN AFTERNOON AS NEEDED LEG EDEMA  [name], NP  Active   gabapentin (NEURONTIN) 300 MG capsule [id number]  TAKE 1 CAPSULE BY MOUTH 3 TIMES DAILY ASNEEDED (NEED OFFICE VISIT FOR FURTHER REFILLS) [name], NP  Active   hydrALAZINE (APRESOLINE) 25 MG tablet [id number] Yes Take 1 tablet (25 mg total) by mouth 3 (three) times daily. [name], NP Taking Active            Med Note Jodi Mourning, Grace Bushy   Fri Dec 05, 2021  3:07 PM) Twice daily  levothyroxine (SYNTHROID) 50 MCG tablet [id number]  TAKE 1 TABLET BY MOUTH ONCE DAILY ON AN EMPTY STOMACH. WAIT 30 MINUTES BEFORE TAKING OTHER MEDS. [name], NP  Active   lisinopril (ZESTRIL) 40 MG tablet [id number] Yes TAKE 1 TABLET BY MOUTH ONCE DAILY [name], NP Taking Active   meloxicam (MOBIC) 15 MG tablet [id number]  TAKE 1 TABLET BY MOUTH ONCE DAILY AS NEEDED WITH FOOD OR MILK [name], NP  Active   methocarbamol (ROBAXIN) 500 MG tablet [id number]  Take 500 mg by mouth every 8 (eight) hours as needed for muscle spasms. [provider]  Active   mometasone-formoterol (DULERA) 100-5 MCG/ACT AERO [id number]  INHALE 2 PUFFS BY MOUTH TWICE A DAY. RINSE MOUTH AFTER USE. [name], NP  Active   montelukast (SINGULAIR) 10 MG tablet [id number]  TAKE 1 TABLET BY MOUTH AT BEDTIME [name], NP  Active   nitroGLYCERIN (NITROSTAT) 0.4 MG SL tablet [id number]  DISSOLVE 1 TABLET UNDER TONGUE AT ONSET OF CHEST PAIN. REPEAT IN 5 MIN IF NOT RESOLVED, MAX 3 DOSES. 911 IF NEEDED. [name], NP  Active   omeprazole (PRILOSEC) 20 MG capsule [id number]  TAKE 1 CAPSULE BY MOUTH ONCE DAILY [name], NP  Active   [facility] HANDIHALER 18 MCG inhalation capsule [id number]  INHALE CONTENTS OF 1 CAPSULE ONCE DAILY [name], NP  Active   spironolactone (ALDACTONE) 25 MG tablet [id number] Yes Take 0.5 tablets (12.5 mg total) by mouth daily. [name], NP  Active   triamcinolone cream (KENALOG) 0.1 % [id number]  APPLY 1  APPLICATION TOPICALLY TWICE DAILY [name], [name], NP  Active   triamcinolone cream (KENALOG) 0.1 % [id number]  Apply 1 application. topically 2 (two) times daily. [name], NP  Active   TRULICITY 7.35 HG/9.9ME [name] [id number]  INJECT 0.75MG SUBQ ONCE A WEEK [name], NP  Active   Vitamin D, Ergocalciferol, (DRISDOL) 1.25 MG (50000 UNIT) CAPS capsule [id number]  TAKE 1 CAPSULE BY MOUTH EVERY 7 DAYS [name], NP  Active               Assessment/Plan:   Hypertension: - Currently uncontrolled - Discussed symptoms with PCP. Counseled patient that if worsening of chest pain and no resolution after 1 NTG, seek urgent evaluation - Will collaborate with cardiology to see if she can be seen sooner.  - Recommend to start spironolactone 12.5 mg daily. Counseled to take in the morning. Follow up with PCP in 2 weeks for BMP and BP check.   Patient also requests to  get second Shingrix vaccine at that appointment.      Follow Up Plan: phone call in 2 weeks to remind to go to PCP appointment  Catie Hedwig Morton, PharmD, [place] [phone]

## 2021-12-05 NOTE — Patient Outreach (Signed)
Medicaid Managed Care   Nurse Care Manager Note  12/05/2021 Name:  Cheryl Chandler MRN:  [id number] DOB:  05-27-1965  Cheryl Chandler is an 56 y.o. year old female who is a primary patient of [name], NP.  The [facility] Managed Care Coordination team was consulted for assistance with:    Chronic healthcare management needs, HTN, DM, tobacco use, anxiety/depression/PTSD, arthritis, LBP, lymphedema, COPD, GERD  Cheryl Chandler was given information about Medicaid Managed Care Coordination team services today. [place] Patient agreed to services and verbal consent obtained.  Engaged with patient by telephone for follow up visit in response to provider referral for case management and/or care coordination services.   Assessments/Interventions:  Review of past medical history, allergies, medications, health status, including review of consultants reports, laboratory and other test data, was performed as part of comprehensive evaluation and provision of chronic care management services.  SDOH (Social Determinants of Health) assessments and interventions performed: [name]    Flowsheet Row Most Recent Value  [name]   Food Insecurity Interventions Other (Comment)  [has resources]     Care Plan  Allergies  Allergen Reactions   Codeine Shortness Of Breath and Rash   Percocet [Oxycodone-Acetaminophen] Shortness Of Breath   Sulfa Antibiotics Shortness Of Breath and Rash   Aspirin Hives   Bee Venom     Bee stings   Darvon [Propoxyphene]     Darvocet-N 100   Latex    Oxycodone Nausea And Vomiting   Penicillins     Has patient had a PCN reaction causing immediate rash, facial/tongue/throat swelling, SOB or lightheadedness with hypotension: Unknown Has patient had a PCN reaction causing severe rash involving mucus membranes or skin necrosis: Unknown Has patient had a PCN reaction that required hospitalization: Unknown Has patient had a PCN  reaction occurring within the last 10 years: Unknown If all of the above answers are "NO", then may proceed with Cephalosporin use.   Medications Reviewed Today     Reviewed by [name], RN (Registered Nurse) on 12/05/21 at 31  Med List Status: <None>   Medication Order Taking? Sig Documenting Provider Last Dose Status Informant  ACCU-CHEK GUIDE test strip [id number] No USE TO CHECK BLOOD SUGAR 3 TIMES DAILY AS DIRECTED [name], NP Taking Active   Accu-Chek Softclix Lancets lancets [id number] No CHECK BLOOD SUGAR TWICE DAILY [name], NP Taking Active   albuterol (PROVENTIL) (2.5 MG/3ML) 0.083% nebulizer solution [id number]  USE 1 VIAL VIA NEBULIZER EVERY 4 HOURS AS NEEDED [name], NP  Active   amLODipine (NORVASC) 5 MG tablet [id number] No TAKE 1 TABLET BY MOUTH ONCE DAILY [name], NP Taking Active   atorvastatin (LIPITOR) 80 MG tablet [id number] No TAKE 1 TABLET BY MOUTH ONCE EVERY [name], NP Taking Active   Blood Glucose Monitoring Suppl (ACCU-CHEK GUIDE) w/Device KIT [id number] No Use to check blood sugar 3 times a day. [name], NP Taking Active   Blood Pressure Monitoring (OMRON 3 SERIES BP MONITOR) DEVI [id number] No Use to check blood pressure as directed [name], NP Taking Active   citalopram (CELEXA) 40 MG tablet [id number]  TAKE 1 TABLET BY MOUTH ONCE DAILY [name], NP  Active   EPINEPHrine 0.3 mg/0.3 mL IJ SOAJ injection [id number]  INJECT 1 SYRINGE INTO OUTER THIGH ONCE AS NEEDED FOR SEVERE ALLERGIC REACTION. [name], NP  Active   furosemide (LASIX) 20 MG tablet [id number] No TAKE 1 TABLET BY MOUTH ONCE DAILY  IN AFTERNOON AS NEEDED LEG EDEMA [name], NP Taking Active   gabapentin (NEURONTIN) 300 MG capsule [id number] No TAKE 1 CAPSULE BY MOUTH 3 TIMES DAILY ASNEEDED (NEED OFFICE VISIT FOR FURTHER REFILLS) [name], NP Taking Active   hydrALAZINE (APRESOLINE) 25 MG tablet  [id number] No Take 1 tablet (25 mg total) by mouth 3 (three) times daily. [name], NP Taking Active   levothyroxine (SYNTHROID) 50 MCG tablet [id number]  TAKE 1 TABLET BY MOUTH ONCE DAILY ON AN EMPTY STOMACH. WAIT 30 MINUTES BEFORE TAKING OTHER MEDS. [name], NP  Active   lisinopril (ZESTRIL) 40 MG tablet [id number] No TAKE 1 TABLET BY MOUTH ONCE DAILY [name], NP Taking Active   meloxicam (MOBIC) 15 MG tablet [id number] No TAKE 1 TABLET BY MOUTH ONCE DAILY AS NEEDED WITH FOOD OR MILK [name], NP Taking Active   methocarbamol (ROBAXIN) 500 MG tablet [id number] No Take 500 mg by mouth every 8 (eight) hours as needed for muscle spasms. [provider] Taking Active   mometasone-formoterol (DULERA) 100-5 MCG/ACT AERO [id number] No INHALE 2 PUFFS BY MOUTH TWICE A DAY. RINSE MOUTH AFTER USE. [name], NP Taking Active   montelukast (SINGULAIR) 10 MG tablet [id number] No TAKE 1 TABLET BY MOUTH AT BEDTIME [name], NP Taking Active   nitroGLYCERIN (NITROSTAT) 0.4 MG SL tablet [id number]  DISSOLVE 1 TABLET UNDER TONGUE AT ONSET OF CHEST PAIN. REPEAT IN 5 MIN IF NOT RESOLVED, MAX 3 DOSES. 911 IF NEEDED. [name], NP  Active   omeprazole (PRILOSEC) 20 MG capsule [id number]  TAKE 1 CAPSULE BY MOUTH ONCE DAILY [name], NP  Active   [facility] HANDIHALER 18 MCG inhalation capsule [id number] No INHALE CONTENTS OF 1 CAPSULE ONCE DAILY [name], NP Taking Active   triamcinolone cream (KENALOG) 0.1 % [id number] No APPLY 1 APPLICATION TOPICALLY TWICE DAILY [name], [name], NP Taking Active   triamcinolone cream (KENALOG) 0.1 % [id number] No Apply 1 application. topically 2 (two) times daily. [name], NP Taking Active   TRULICITY 9.02 IO/9.7DZ [name] [id number] No INJECT 0.75MG SUBQ ONCE A [name], NP Taking Active   Vitamin D, Ergocalciferol, (DRISDOL) 1.25 MG (50000 UNIT) CAPS capsule [id number] No TAKE 1 CAPSULE BY  MOUTH EVERY 7 DAYS [name], NP Taking Active            Patient Active Problem List   Diagnosis Date Noted   Foot pain, right 09/01/2021   Fibrocystic disease of both breasts 05/07/2019   IFG (impaired fasting glucose) 05/03/2019   Chronic venous insufficiency 11/09/2018   Overactive bladder 09/30/2018   Peripheral neuropathy 09/30/2018   Apnea 01/02/2016   Lymphedema 11/30/2014   Dyspnea on exertion 11/30/2014   Anxiety 11/29/2014   Arthritis 11/29/2014   COPD (chronic obstructive pulmonary disease) ([place]) 11/29/2014   Clinical depression 11/29/2014   H/O eating disorder 11/29/2014   Esophageal reflux 11/29/2014   Acne inversa 11/29/2014   Essential hypertension 11/29/2014   HLD (hyperlipidemia) 11/29/2014   Adult hypothyroidism 11/29/2014   Insomnia 11/29/2014   Low back pain 11/29/2014   Morbid obesity ([place]) 11/29/2014   Posttraumatic stress disorder 11/29/2014   Vitamin D deficiency 11/29/2014   Nicotine dependence, cigarettes, uncomplicated 79/89/2119   Conditions to be addressed/monitored per PCP order:  Chronic healthcare management needs, HTN, DM, tobacco use, anxiety/depression/PTSD, arthritis, LBP, lymphedema, COPD, GERD Care Plan : RNCM: COPD (Adult)  Updates made by [name], RN since 12/05/2021 12:00 AM   Long-Range Goal: RNCM: Adjustment  to Disease Achieved   Start Date: 05/22/2020  Expected End Date: 02/03/2022  Recent Progress: On track  Priority: High  Note:   Current Barriers:  Knowledge deficits related to basic understanding of COPD disease process Knowledge deficits related to basic COPD self care/management Knowledge deficit related to basic understanding of how to use inhalers and how inhaled medications work Knowledge deficit related to importance of energy conservation Limited Social Support Unable to independently manage COPD Lacks social connections Does not contact provider office for questions/concerns  12/05/21:  SOB  unchanged, Oxygen saturations 95% today, smokes 2-3 cigarettes a day Case Manager Clinical Goal(s): patient will report using inhalers as prescribed including rinsing mouth after use patient will be able to verbalize understanding of COPD action plan and when to seek appropriate levels of medical care  patient will engage in lite exercise as tolerated to build/regain stamina and strength and reduce shortness of breath through activity tolerance  patient will verbalize basic understanding of COPD disease process and self care activities  Interventions:  Collaboration with [name], NP regarding development and update of comprehensive plan of care as evidenced by provider attestation and co-signature Inter-disciplinary care team collaboration (see longitudinal plan of care) UNABLE to independently: manage COPD Provided patient with basic written and verbal COPD education on self care/management/and exacerbation prevention Provided patient with COPD action plan and reinforced importance of daily self assessment.  Provided written and verbal instructions on pursed lip breathing and utilized returned demonstration as teach back Provided instruction about proper use of medications used for management of COPD including inhalers Advised patient to self assesses COPD action plan zone and make appointment with provider if in the yellow zone for 48 hours without improvement. Provided patient with education about the role of exercise in the management of COPD Advised patient to engage in light exercise as tolerated 3-5 days a week Provided education about and advised patient to utilize infection prevention strategies to reduce risk of respiratory infection. Patient Goals/Self-Care Activities:  - decision-making supported - depression screen reviewed - emotional support provided - family involvement promoted - problem-solving facilitated - relaxation techniques promoted - verbalization of  feelings encouraged Follow Up Plan: Telephone follow up appointment with care management team member scheduled.   Problem: RNCM: Symptom Exacerbation (COPD)   Priority: High  Onset Date: 05/22/2020     Long-Range Goal: RNCM: Smoking Cessation Symptom Exacerbation Prevented or Minimized   Start Date: 05/22/2020  Expected End Date: 03/07/2022  Recent Progress: Not on track  Priority: High  Note:   Current Barriers:  Unable to independently stop smoking  Does not adhere to provider recommendations re: smoking cessation  Lacks social connections Does not contact provider office for questions/concerns Tobacco abuse of >40 years; trying to stop-says she has stopped Previous quit attempts, unsuccessful several successful using patches, gum- has a pattern of stopping and then starting back  Reports smoking within 30 minutes of waking up Reports triggers to smoke include: stress, chronic conditions  Reports motivation to quit smoking includes: knows it will help her breathing and other chronic conditions  On a scale of 1-10, reports MOTIVATION to quit is 8 On a scale of 1-10, reports CONFIDENCE in quitting is 7 12/05/21:  Patient continues to try and quit smoking-2-3 cigarettes a day  Clinical Goal(s):  patient will work with RN Case Engineer, civil (consulting) and provider towards tobacco cessation  Interventions: Collaboration with [name], NP regarding development and update of comprehensive plan of care as  evidenced by provider attestation and co-signature Inter-disciplinary care team collaboration (see longitudinal plan of care) Evaluation of current treatment plan reviewed.  Provided contact information for [place] [facility] (1-800-QUIT-NOW). Patient will outreach this group for support. Update 01/22/21:  Patient states she has utilized [employer] for resources. Discussed plans with patient for ongoing care management follow up and provided patient with direct contact  information for care management team Provided patient with printed smoking cessation educational materials Provided contact information for [place] [facility] (1-800-QUIT-NOW). Patient will outreach this group for support. Praised patient for decreasing smoking- continued encouragement given. Patient Goals/Self-Care Activities patient will:  - mindfulness, talking to family and friends as a habit during cravings  - verbally commit to reducing tobacco consumption - barriers to lifestyle changes reviewed and addressed - barriers to treatment reviewed and addressed - breathing techniques encouraged - modification of home and work environment promoted - rescue (action) plan reviewed - signs/symptoms of infection reviewed - signs/symptoms of worsening disease assessed - treatment plan reviewed Follow Up Plan: Telephone follow up appointment with care management team member scheduled.    Care Plan : RNCM: Hypertension (Adult)  Updates made by [name], RN since 12/05/2021 12:00 AM     Problem: RNCM: Hypertension (Hypertension)   Priority: Medium  Onset Date: 05/22/2020     Long-Range Goal: RNCM: Hypertension Monitored   Start Date: 05/22/2020  Expected End Date: 03/07/2022  Recent Progress: On track  Priority: Medium  Note:    Current Barriers:  Knowledge Deficits related to basic understanding of hypertension pathophysiology and self care management Knowledge Deficits related to understanding of medications prescribed for management of hypertension Limited Social Support Unable to independently manage HTN Lacks social connections Does not contact provider office for questions/concern 3/32/95:  Hard to ascertain exact BP readings from patient, 135-185/50-65 per patient. Case Manager Clinical Goal(s):   patient will verbalize understanding of plan for hypertension management patient will demonstrate improved adherence to prescribed treatment plan for hypertension as evidenced by  taking all medications as prescribed, monitoring and recording blood pressure as directed, adhering to low sodium/DASH diet patient will demonstrate improved health management independence as evidenced by checking blood pressure as directed and notifying PCP if SBP>160 or DBP > 90, taking all medications as prescribe, and adhering to a low sodium diet as discussed. Interventions:  Collaboration with [name], NP regarding development and update of comprehensive plan of care as evidenced by provider attestation and co-signature Inter-disciplinary care team collaboration (see longitudinal plan of care) Evaluation of current treatment plan related to hypertension self management and patient's adherence to plan as established by provider.  Provided education to patient re: stroke prevention, s/s of heart attack and stroke, DASH diet, complications of uncontrolled blood pressure Reviewed medications with patient and discussed importance of compliance.  Discussed plans with patient for ongoing care management follow up and provided patient with direct contact information for care management team Advised patient, providing education and rationale, to monitor blood pressure daily and record, calling PCP for findings outside established parameters.  Patient Goals/Self-Care Activities Over the next 120 days, patient will:  - Self administers medications as prescribed Attends all scheduled provider appointments Calls provider office for new concerns, questions, or BP outside discussed parameters Checks BP and records as discussed Follows a low sodium diet/DASH diet - blood pressure trends reviewed - depression screen reviewed - home or ambulatory blood pressure monitoring encouraged Follow Up Plan: Telephone follow up appointment with care management team  member scheduled.      Care Plan : RNCM: Depression (Adult)  Updates made by [name], RN since 12/05/2021 12:00 AM     Problem:  RNCM: Symptoms (Depression)   Priority: High  Onset Date: 12/02/2020     Long-Range Goal: RNCM: Symptoms Monitored and Managed   Start Date: 12/02/2020  Expected End Date: 02/04/2023  Recent Progress: Not on track  Priority: High  Note:   Current Barriers:  Ineffective Self Health Maintenance in a patient with Anxiety and Depression Unable to independently manage depression and anxiety as evidence of multiple chronic conditions and the patient feeling her health is out of control  Does not attend all scheduled provider appointments Lacks social connections Unable to perform IADLs independently Does not contact provider office for questions/concerns 12/05/21:  LCSW and BSW follow-has follow up appts, no complaints today Clinical Goal(s):  Collaboration with [name], NP regarding development and update of comprehensive plan of care as evidenced by provider attestation and co-signature Inter-disciplinary care team collaboration (see longitudinal plan of care) patient will work with care management team to address care coordination and chronic disease management needs related to Disease Management Educational Needs Care Coordination Mental Health Counseling Level of Care Concerns   Interventions:  Evaluation of current treatment plan related to Anxiety and Depression, Financial constraints related to insurance not approving care for the patient to go to wound care , Limited social support, Level of care concerns, ADL IADL limitations, Mental Health Concerns , Family and relationship dysfunction, Substance abuse issues -  smoker , and Inability to perform IADL's independently self-management and patient's adherence to plan as established by provider. Collaboration with [name], NP regarding development and update of comprehensive plan of care as evidenced by provider attestation       and co-signature Inter-disciplinary care team collaboration (see longitudinal plan of  care) Discussed plans with patient for ongoing care management follow up and provided patient with direct contact information for care management team Evaluation of depression and anxiety.  LCSW referral for increased stress-completed Collaborated with LCSW  Self Care Activities:  Patient verbalizes understanding of plan to effectively manage depression and anxiety Self administers medications as prescribed Attends all scheduled provider appointments Calls pharmacy for medication refills Attends church or other social activities Performs ADL's independently Performs IADL's independently Calls provider office for new concerns or questions Work with the [facility] team to meet needs for continued CCM services  Patient Goals: - activity or exercise based on tolerance encouraged - depression screen reviewed - emotional support provided - healthy lifestyle promoted - medication side effects monitored and managed - pain managed - participation in mental health treatment encouraged - quality of sleep assessed - response to mental health treatment monitored - response to pharmacologic therapy monitored - sleep hygiene techniques encouraged - social activities and relationships encouraged - substance use assessed Follow Up Plan: The care management team will reach out to the patient again over the next 30 business days.   Follow Up:  Patient agrees to Care Plan and Follow-up.  Plan: The Managed Medicaid care management team will reach out to the patient again over the next 30 business  days. and The  Patient has been provided with contact information for the Managed Medicaid care management team and has been advised to call with any health related questions or concerns.  Date/time of next scheduled RN care management/care coordination outreach: 01/14/22 at 1030.

## 2021-12-08 ENCOUNTER — Other Ambulatory Visit: Payer: Self-pay | Admitting: Obstetrics and Gynecology

## 2021-12-09 ENCOUNTER — Other Ambulatory Visit (INDEPENDENT_AMBULATORY_CARE_PROVIDER_SITE_OTHER): Payer: Self-pay | Admitting: Vascular Surgery

## 2021-12-09 DIAGNOSIS — M79671 Pain in right foot: Secondary | ICD-10-CM

## 2021-12-09 DIAGNOSIS — R3981 Functional urinary incontinence: Secondary | ICD-10-CM | POA: Diagnosis not present

## 2021-12-09 DIAGNOSIS — Z993 Dependence on wheelchair: Secondary | ICD-10-CM | POA: Diagnosis not present

## 2021-12-10 ENCOUNTER — Telehealth: Payer: Self-pay | Admitting: Licensed Clinical Social Worker

## 2021-12-10 ENCOUNTER — Ambulatory Visit (INDEPENDENT_AMBULATORY_CARE_PROVIDER_SITE_OTHER): Payer: Medicaid Other

## 2021-12-10 ENCOUNTER — Ambulatory Visit (INDEPENDENT_AMBULATORY_CARE_PROVIDER_SITE_OTHER): Payer: Medicaid Other | Admitting: Nurse Practitioner

## 2021-12-10 ENCOUNTER — Encounter (INDEPENDENT_AMBULATORY_CARE_PROVIDER_SITE_OTHER): Payer: Self-pay | Admitting: Nurse Practitioner

## 2021-12-10 VITALS — BP 126/60 | HR 64 | Resp 16

## 2021-12-10 DIAGNOSIS — I10 Essential (primary) hypertension: Secondary | ICD-10-CM

## 2021-12-10 DIAGNOSIS — I890 Lymphedema, not elsewhere classified: Secondary | ICD-10-CM | POA: Diagnosis not present

## 2021-12-10 DIAGNOSIS — M79671 Pain in right foot: Secondary | ICD-10-CM

## 2021-12-11 ENCOUNTER — Other Ambulatory Visit: Payer: Self-pay | Admitting: Licensed Clinical Social Worker

## 2021-12-11 ENCOUNTER — Telehealth (INDEPENDENT_AMBULATORY_CARE_PROVIDER_SITE_OTHER): Payer: Self-pay

## 2021-12-11 NOTE — Telephone Encounter (Signed)
Pt called stating that her sister would do her wraps.  I called her back to let her know we do not prescribe unnaboot supplies for family to put on and take off. However we can get Home health to come out to her house as we offered yesterday.  Pt states that financially she thinks home health would be her better choice.  I let her know we would get home health setup to come out and someone would reach back out to her when this is done.

## 2021-12-11 NOTE — Telephone Encounter (Signed)
She can come in to do wraps or she can try to find compression to use as we discussed

## 2021-12-11 NOTE — Patient Instructions (Signed)
Visit Information  Ms. Beavers was given information about Medicaid Managed Care team care coordination services as a part of their [facility] Medicaid benefit. Lenora Marlane Hirschmann verbally consented to engagement with the [facility] Managed Care team.   If you are experiencing a medical emergency, please call 911 or report to your local emergency department or urgent care.   If you have a non-emergency medical problem during routine business hours, please contact your provider's office and ask to speak with a nurse.   For questions related to your [facility] health plan, please call: [phone] or go here:[email or web address]  If you would like to schedule transportation through your [facility] plan, please call the following number at least 2 days in advance of your appointment: [phone].  You can also use the MTM portal or MTM mobile app to manage your rides. For the portal, please go to [email or web address]  Call the [facility] Crisis Line at [phone], at any time, 24 hours a day, 7 days a week. If you are in danger or need immediate medical attention call 911.  If you would like help to quit smoking, call 1-800-QUIT-NOW (954-148-7512) OR Espaol: 1-855-Djelo-Ya (6-979-480-1655) o para ms informacin haga clic aqu or Text READY to 374-827 to register via text  Following is a copy of your plan of care:  Care Plan : General Social Work (Adult)  Updates made by Gustavus Bryant, LCSW since 12/11/2021 12:00 AM     Problem: Coping Skills (General Plan of Care)      Long-Range Goal: Coping Skills Enhanced   Start Date: 11/07/2020  Expected End Date: 03/07/2022  Recent Progress: On track  Priority: High  Note:   Timeframe:  Long-Range Goal Priority:  High Start Date:       08/26/21                    Expected End Date:  ongoing  Follow up date: 01/19/22 at 1:00 pm  Current Barriers:  Financial constraints Mental Health Concerns  Social  Isolation Limited education about mental health support resources that are available to her within the local area* Cognitive Deficits Lacks knowledge of community resource  Clinical Social Work Clinical Goal(s):  Over the next 90 days, client will work with SW to address concerns related to experiencing ongoing symptoms of depression, sadness and grief since the passing of her mother Over the next 90 days, patient will work with LCSW to address needs related to implementing appropriate self-care and depression management.   Managing Loss, Adult People experience loss in many different ways throughout their lives. Events such as moving, changing jobs, and losing friends can create a sense of loss. The loss may be as serious as a major health change, divorce, death of a pet, or death of a loved one. All of these types of loss are likely to create a physical and emotional reaction known as grief. Grief is the result of a major change or an absence of something or someone that you count on. Grief is a normal reaction to loss. A variety of factors can affect your grieving experience, including: The nature of your loss. Your relationship to what or whom you lost. Your understanding of grief and how to manage it. Your support system. How to manage lifestyle changes Keep to your normal routine as much as possible. If you have trouble focusing or doing normal activities, it is acceptable to take some time away from your normal routine. Spend time with friends  and loved ones. Eat a healthy diet, get plenty of sleep, and rest when you feel tired. How to recognize changes  The way that you deal with your grief will affect your ability to function as you normally do. When grieving, you may experience these changes: Numbness, shock, sadness, anxiety, anger, denial, and guilt. Thoughts about death. Unexpected crying. A physical sensation of emptiness in your stomach. Problems sleeping and  eating. Tiredness (fatigue). Loss of interest in normal activities. Dreaming about or imagining seeing the person who died. A need to remember what or whom you lost. Difficulty thinking about anything other than your loss for a period of time. Relief. If you have been expecting the loss for a while, you may feel a sense of relief when it happens. Follow these instructions at home:    Activity Express your feelings in healthy ways, such as: Talking with others about your loss. It may be helpful to find others who have had a similar loss, such as a support group. Writing down your feelings in a journal. Doing physical activities to release stress and emotional energy. Doing creative activities like painting, sculpting, or playing or listening to music. Practicing resilience. This is the ability to recover and adjust after facing challenges. Reading some resources that encourage resilience may help you to learn ways to practice those behaviors. General instructions Be patient with yourself and others. Allow the grieving process to happen, and remember that grieving takes time. It is likely that you may never feel completely done with some grief. You may find a way to move on while still cherishing memories and feelings about your loss. Accepting your loss is a process. It can take months or longer to adjust. Keep all follow-up visits as told by your health care provider. This is important. Where to find support To get support for managing loss: Ask your health care provider for help and recommendations, such as grief counseling or therapy. Think about joining a support group for people who are managing a loss. Where to find more information You can find more information about managing loss from: American Society of Clinical Oncology: www.cancer.net American Psychological Association: DiceTournament.ca Contact a health care provider if: Your grief is extreme and keeps getting worse. You have  ongoing grief that does not improve. Your body shows symptoms of grief, such as illness. You feel depressed, anxious, or lonely. Get help right away if: You have thoughts about hurting yourself or others. If you ever feel like you may hurt yourself or others, or have thoughts about taking your own life, get help right away. You can go to your nearest emergency department or call: Your local emergency services (911 in the [place] A suicide crisis helpline, such as the National Suicide Prevention Lifeline at (707)087-2697. This is open 24 hours a day. Summary Grief is the result of a major change or an absence of someone or something that you count on. Grief is a normal reaction to loss. The depth of grief and the period of recovery depend on the type of loss and your ability to adjust to the change and process your feelings. Processing grief requires patience and a willingness to accept your feelings and talk about your loss with people who are supportive. It is important to find resources that work for you and to realize that people experience grief differently. There is not one grieving process that works for everyone in the same way. Be aware that when grief becomes extreme, it can lead  to more severe issues like isolation, depression, anxiety, or suicidal thoughts. Talk with your health care provider if you have any of these issues. This information is not intended to replace advice given to you by your health care provider. Make sure you discuss any questions you have with your health care provider. Document Revised: 06/17/2018 Document Reviewed: 08/27/2016 Elsevier Patient Education  2020 [employer].  Help for Managing Grief   When you are experiencing grief, many resources and support are available to help you. You are not alone.    Grief Share (https://www.SunglassSpecialist.gl):    [employer] of [address] [place], [place]      [employer]  [phone] S.  Church [address] [place], [place]     [facility] [address] [place], [place]      [facility] [address] [place], [place]  [phone]   [facility] [address] [place], [place] [phone]   [facility] [address] [place], [place]     [phone]   [facility] [address] [place], [place]     [phone]   If a Hospice or Palliative Care team has been involved in the care of your loved one, you may reach out to your contact there or locally, you may reach out to [facility]:    [facility]   [address] [place], [place] [address] [phone] Patient was educated on healthy self-care as she admits she has little motivation to walk lately. PCS recommendation was suggested.   Patient Self Care Activities:  Attends all scheduled provider appointments Calls provider office for new concerns or questions  Please see past updates related to this goal by clicking on the "Past Updates" button in the selected goal

## 2021-12-11 NOTE — Telephone Encounter (Signed)
Pt made aware she states she is in a financial situation but she will try her best to get to the office

## 2021-12-11 NOTE — Telephone Encounter (Signed)
Tried to call pt no answer and no VM

## 2021-12-11 NOTE — Patient Outreach (Addendum)
Medicaid Managed Care Social Work Note  12/11/2021 Name:  Cheryl Chandler MRN:  [id number] DOB:  11-20-1965  Cheryl Chandler is an 56 y.o. year old female who is a primary patient of Cheryl Billings, NP.  The Medicaid Managed Care Coordination team was consulted for assistance with:  [place] [facility] Resources  [name] was given information about Medicaid Managed Care Coordination team services today. [name] agreed to services and verbal consent obtained.  Engaged with patient  for by telephone forfollow up visit in response to referral for case management and/or care coordination services.   Assessments/Interventions:  Review of past medical history, allergies, medications, health status, including review of consultants reports, laboratory and other test data, was performed as part of comprehensive evaluation and provision of chronic care management services.  SDOH: (Social Determinant of Health) assessments and interventions performed: [name]    Flowsheet Row Most Recent Value  [name]   Stress Interventions Offered [employer], Provide Counseling       Advanced Directives Status:  See Care Plan for related entries.  Care Plan                 Allergies  Allergen Reactions   Codeine Shortness Of Breath and Rash   Percocet [Oxycodone-Acetaminophen] Shortness Of Breath   Sulfa Antibiotics Shortness Of Breath and Rash   Aspirin Hives   Bee Venom     Bee stings   Darvon [Propoxyphene]     Darvocet-N 100   Latex    Oxycodone Nausea And Vomiting   Penicillins     Has patient had a PCN reaction causing immediate rash, facial/tongue/throat swelling, SOB or lightheadedness with hypotension: Unknown Has patient had a PCN reaction causing severe rash involving mucus membranes or skin necrosis: Unknown Has patient had a PCN reaction that required hospitalization: Unknown Has patient had a PCN  reaction occurring within the last 10 years: Unknown If all of the above answers are "NO", then may proceed with Cephalosporin use.    Medications Reviewed Today     Reviewed by [name], LCSW (Social Worker) on 12/11/21 at [place] [facility] Status: <None>   Medication Order Taking? Sig Documenting Provider Last Dose Status Informant  ACCU-CHEK GUIDE test strip [id number] No USE TO CHECK BLOOD SUGAR 3 TIMES DAILY AS DIRECTED Cheryl Billings, NP Taking Active   Accu-Chek Softclix Lancets lancets [id number] No CHECK BLOOD SUGAR TWICE DAILY Cheryl Billings, NP Taking Active   albuterol (PROVENTIL) (2.5 MG/3ML) 0.083% nebulizer solution [id number] No USE 1 VIAL VIA NEBULIZER EVERY 4 HOURS AS NEEDED Cheryl Billings, NP Taking Active   amLODipine (NORVASC) 5 MG tablet [id number] No TAKE 1 TABLET BY MOUTH ONCE DAILY Cheryl Billings, NP Taking Active   atorvastatin (LIPITOR) 80 MG tablet [id number] No TAKE 1 TABLET BY MOUTH ONCE EVERY [name], NP Taking Active   Blood Glucose Monitoring Suppl (ACCU-CHEK GUIDE) w/Device KIT [id number] No Use to check blood sugar 3 times a day. [name], NP Taking Active   Blood Pressure Monitoring (OMRON 3 SERIES BP MONITOR) DEVI [id number] No Use to check blood pressure as directed Cheryl Billings, NP Taking Active   citalopram (CELEXA) 40 MG tablet [id number] No TAKE 1 TABLET BY MOUTH ONCE DAILY Cheryl Billings, NP Taking Active   EPINEPHrine 0.3 mg/0.3 mL IJ SOAJ injection [id number] No INJECT 1 SYRINGE INTO OUTER THIGH ONCE AS NEEDED FOR SEVERE ALLERGIC REACTION. Cheryl Billings, NP Taking Active  furosemide (LASIX) 20 MG tablet [id number] No TAKE 1 TABLET BY MOUTH ONCE DAILY IN AFTERNOON AS NEEDED LEG EDEMA Cheryl Billings, NP Taking Active   gabapentin (NEURONTIN) 300 MG capsule [id number] No TAKE 1 CAPSULE BY MOUTH 3 TIMES DAILY ASNEEDED (NEED OFFICE VISIT FOR FURTHER REFILLS) Cheryl Billings, NP Taking Active   hydrALAZINE  (APRESOLINE) 25 MG tablet [id number] No Take 1 tablet (25 mg total) by mouth 3 (three) times daily. Cheryl Billings, NP Taking Active            Med Note Jodi Mourning, Grace Bushy   Fri Dec 05, 2021  3:07 PM) Twice daily  levothyroxine (SYNTHROID) 50 MCG tablet [id number] No TAKE 1 TABLET BY MOUTH ONCE DAILY ON AN EMPTY STOMACH. WAIT 30 MINUTES BEFORE TAKING OTHER MEDS. Cheryl Billings, NP Taking Active   lisinopril (ZESTRIL) 40 MG tablet [id number] No TAKE 1 TABLET BY MOUTH ONCE DAILY Cheryl Billings, NP Taking Active   meloxicam (MOBIC) 15 MG tablet [id number] No TAKE 1 TABLET BY MOUTH ONCE DAILY AS NEEDED WITH FOOD OR MILK Cheryl Billings, NP Taking Active   methocarbamol (ROBAXIN) 500 MG tablet [id number] No Take 500 mg by mouth every 8 (eight) hours as needed for muscle spasms. [provider] Taking Active   mometasone-formoterol (DULERA) 100-5 MCG/ACT AERO [id number] No INHALE 2 PUFFS BY MOUTH TWICE A DAY. RINSE MOUTH AFTER USE. Cheryl Billings, NP Taking Active   montelukast (SINGULAIR) 10 MG tablet [id number] No TAKE 1 TABLET BY MOUTH AT BEDTIME Cheryl Billings, NP Taking Active   nitroGLYCERIN (NITROSTAT) 0.4 MG SL tablet [id number] No DISSOLVE 1 TABLET UNDER TONGUE AT ONSET OF CHEST PAIN. REPEAT IN 5 MIN IF NOT RESOLVED, MAX 3 DOSES. 911 IF NEEDED. Cheryl Billings, NP Taking Active   omeprazole (PRILOSEC) 20 MG capsule [id number] No TAKE 1 CAPSULE BY MOUTH ONCE DAILY Cheryl Billings, NP Taking Active   SPIRIVA HANDIHALER 18 MCG inhalation capsule [id number] No INHALE CONTENTS OF 1 CAPSULE ONCE DAILY Cheryl Billings, NP Taking Active   spironolactone (ALDACTONE) 25 MG tablet [id number] No Take 0.5 tablets (12.5 mg total) by mouth daily. Cheryl Billings, NP Taking Active   triamcinolone cream (KENALOG) 0.1 % [id number] No APPLY 1 APPLICATION TOPICALLY TWICE DAILY [name], [name], NP Taking Active   triamcinolone cream (KENALOG) 0.1 % [id number] No Apply 1 application. topically 2  (two) times daily. Cheryl Billings, NP Taking Active   TRULICITY 1.03 XY/8.1VW [name] [id number] No INJECT 0.75MG SUBQ ONCE A [name], NP Taking Active   Vitamin D, Ergocalciferol, (DRISDOL) 1.25 MG (50000 UNIT) CAPS capsule [id number] No TAKE 1 CAPSULE BY MOUTH EVERY 7 DAYS Cheryl Billings, NP Taking Active             Patient Active Problem List   Diagnosis Date Noted   Foot pain, right 09/01/2021   Fibrocystic disease of both breasts 05/07/2019   IFG (impaired fasting glucose) 05/03/2019   Chronic venous insufficiency 11/09/2018   Overactive bladder 09/30/2018   Peripheral neuropathy 09/30/2018   Apnea 01/02/2016   Lymphedema 11/30/2014   Dyspnea on exertion 11/30/2014   Anxiety 11/29/2014   Arthritis 11/29/2014   COPD (chronic obstructive pulmonary disease) ([place]) 11/29/2014   Clinical depression 11/29/2014   H/O eating disorder 11/29/2014   Esophageal reflux 11/29/2014   Acne inversa 11/29/2014   Essential hypertension 11/29/2014   HLD (hyperlipidemia) 11/29/2014   Adult hypothyroidism 11/29/2014   Insomnia 11/29/2014   Low back pain 11/29/2014   Morbid obesity ([place]) 11/29/2014   Posttraumatic  stress disorder 11/29/2014   Vitamin D deficiency 11/29/2014   Nicotine dependence, cigarettes, uncomplicated 20/94/7096    Conditions to be addressed/monitored per PCP order:  Depression  Care Plan : General Social Work (Adult)  Updates made by [name], LCSW since 12/11/2021 12:00 AM     Problem: Coping Skills (General Plan of Care)      Long-Range Goal: Coping Skills Enhanced   Start Date: 11/07/2020  Expected End Date: 03/07/2022  Recent Progress: On track  Priority: High  Note:   Timeframe:  Long-Range Goal Priority:  High Start Date:       08/26/21                    Expected End Date:  ongoing  Follow up date: 01/19/22 at 1:00 pm  Current Barriers:  Financial constraints Mental Health Concerns  Social Isolation Limited education about  mental health support resources that are available to her within the local area* Cognitive Deficits Lacks knowledge of community resource  Clinical Social Work Clinical Goal(s):  Over the next 90 days, client will work with SW to address concerns related to experiencing ongoing symptoms of depression, sadness and grief since the passing of her mother Over the next 90 days, patient will work with LCSW to address needs related to implementing appropriate self-care and depression management.   Interventions: Patient interviewed and appropriate assessments performed Provided mental health counseling with regards to patient's loss and ongoing stressors. Patient was very close with her mother who passed away a few years ago and she continues to experience symptoms of grief from this loss. Patient denies wanting LCSW to make a referral for grief counseling through [facility] again but was receptive to coping skill and resource education/information that was provided during session today. Patient reports decorating for the holidays helps her to connect with her mother as they did this together when she was alive.  Provided patient with information about managing depression within her daily life. Patient has ongoing grief symptoms as well. Patient states "I feel worn down." Patient reports that she talks out loud to her mother's picture and this is very helpful to her. Grief support resource education provided again during session today.  Provided reflective listening and implemented appropriate interventions to help suppport patient and her emotional needs  Discussed plans with patient for ongoing care management follow up and provided patient with direct contact information for [facility] team Advised patient to contact [facility] team with any urgent case management needs. Assisted patient/caregiver with obtaining information about health plan benefits LCSW completed past referral for family to have a wheelchair ramp  built at their residence due to ongoing mobility concerns. Ramp has been successfully installed.  PCS actively involved with patient and providing ongoing services.  Positive reinforcement provided to patient for quitting smoking cigarettes. Coping skill review education provided for future triggers.  Brief self-care education provided to both patient and spouse. Patient confirms that she continues to go to food pantry for food insecurity. Resource information has been provided.  Patient reports ongoing pain and issues with mobility. Patient shares that her pain resides in her pains and legs. Emotional support and coping skill education provided. Patient confirms that her PCS aide continues to provide her with services. However, aide has to be reminded to put on her mask since patient is high risk. LCSW reviewed resources that [name] provided her with in the past.  Patient reports that she had a wonderful birthday spent with her  family this past May. Patient reports receiving appropriate socialization. Patient reports that her spouse took her out to eat and gifted her with several things which was a nice surprise.  Patient confirms stable transportation arrangements for upcoming PCP appointment this week.  Patient repots that she is losing a lot of weight but is not sure how much she has lost as she does not have a scale. Patient reports that a goal of hers is to lose weight but she has not been intentionally losing weight at this time.  Patient reports that her aide [name] continues to assist her with PCS and is an excellent support within the home for her and spouse.  Patient planted a tomato plant and successfully grew over 5 tomatoes over the summer. Patient is very proud of this achievement as this was her first time planting a tomato plant and is now interested in picking up gardening/planting as a hobby. Patient reports that her aunt continues to be a positive support for her in her life and  someone that she can rely on in the case of emergencies.  Patient was educated on healthy self-care skills and advised her to use these into her daily routine.  04/10/21- Update- Patient is agreeable to social work closure at this time as she still does not need medication management or counseling as she is managing her mental health well at this time. Patient talks to her younger sister everyday to gain emotional support. Patient says that her sister is into body building and helps to educate and remind her to do her own self-care. Patient is agreeable to contact [facility] LCSW directly if any social work concern arise. [facility] LCSW will complete case closure at this time.  Patient reports that she has been elevating her legs daily to elevate pain and swelling.  [facility] LCSW completed care coordination with Thomasene Lot on 01/15/21 and [facility] LCSW provided the following suggested resource-It is likely their best option given their loss of housing and lack of consistent income:  Publishing rights manager                                                                                                                      (Emergency [place]) [phone] Ext. 104 M-F, 8am - 4pm Patient was successfully approved for food stamps but is still experiencing financial concerns. She reports that she and her husband have no clothes that fit them and they are having to start using a string for their pants and patient only has one bra at this time. [place] LCSW will send referral to [facility] BSW today for financial support.  BSW spoke with patient about resources needed. Patient states she does receive SSI and was approved for foodstamps. Patient is familiar with all of the food pantries and other resources in [place]. BSW will assist patient with finding an eye doctor. Patient states she has not been in 10 years. BSW will research and put a list of eye doctors together that  accept Medicaid in [facility]. 04/30/20: BSW contacted and spoke with patient, she stated she was not doing good because she found out that she has a new landlord and they are going up on her rent. BSW informed patient to make sure she informs her social workers at [employer] that they have an increase for foodstamps and Medicaid. Patient stated that she sleeps on the couch and is in need of a power recliner/lift chair. BSW will send patients PCP a message asking for a script to be sent. 05/21/21: BSW completed follow up telephone call with patient. She states she is unsure if their new landlord has received their January rent. Patient states she is also worried due to the new landlord making them pay rent online and on the 1st of the month. Patient states she is unable to get anyone on the phone. Patient states her rent will go up to 400 from 255 in March. Patient states she does not have many clothes but goes to [employer] to get some Materials engineer. No other resources needed at this time.   06/20/21: BSW completed follow up telelphone call with patient. Patient states she is doing well. Patient states [employer] [place] will cover all of Vaccines. Patient states no resources are needed at this time. 08/26/21: BSW completed telephone outreach with patient, she stated since going to the hospital she has been feeling weak. Patient states other than that she is feeling okay. Patient stated she is going to get a new phone on Wednesday. Patient states her birthday is on Friday and her husband may take her to a [place]. No other resources are needed at this time.Update- Patient reports experiencing additional grief lately. [facility] LCSW offered referral for berievement counseling but she declined. UPDATE 08/26/21- Patient reports that she has a near death experience last month. Patient shares that she had a fall. 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 [facility] LCSW completed education on available mental health resources within her area to  utilize. Patient admits to experiencing some sadness due to grief from her mother's passing. Emotional support provided. 10/13/21 UPDATE- Patient reports that she is working hard on living a healthier life both physically and mentally. Patient reports that she is making active changes in her diet. She reports that she eventually wishes to quit smoking again and has started picking up new hobbies to implement during times of craving such as coloring, crocheting and being creative. Patient reports that she is unsure if she needs mental health support at this time because her main concerns are centered around her and her spouse's physical health instead their mental health. [facility] LCSW educated patient on how they affect one another and their importance. Patient denies any urgent concerns. She reports no issues with affording food due to food stamp assistance. [facility] LCSW will reassess social work needs 30 days from now. UPDATE 11/17/21- Patient reports that she is experiencing several stressors at this time. She was encouraged to consider mental health treatment but she is not interested at this time. Patient was encouraged to consider mental health resources. Patient reports that their car is in the shop and that they have no stable transportation besides their sister and her husband's father. Patient reports that her spouse has had several recent health issues that have affected her mental state. Patient was educated on healthy coping skills for stress. Patient denies no urgent concerns or issues at this time. UPDATE- Care Coordination completed with [facility] LCSW, PCP office, Pharmacist, [facility] BSW  and PCP. Patient has canceled several medical appointments because they are unable to afford a copay. Patient will benefit from financial assistance and may benefit from a [facility] application. Patient has an upcoming appointment with [facility] BSW next week. Patient is agreeable to exploring financial assistance options with [facility] BSW  that will help alleviate other areas of financial stress within their life in order for patient to be able to afford medical expenses. [facility] LCSW encouraged patient to consider individual counseling. Patient is agreeable to referral for counseling but needs to find out her sister's email first as she is unable to go to office to fill out intake paperwork. Patient prefers virtual counseling to save money on transportation. [facility] LCSW will successfully place referral to [facility] during next outreach. [facility] LCSW completed care coordination with [facility] BSW.   Managing Loss, Adult People experience loss in many different ways throughout their lives. Events such as moving, changing jobs, and losing friends can create a sense of loss. The loss may be as serious as a major health change, divorce, death of a pet, or death of a loved one. All of these types of loss are likely to create a physical and emotional reaction known as grief. Grief is the result of a major change or an absence of something or someone that you count on. Grief is a normal reaction to loss. A variety of factors can affect your grieving experience, including: The nature of your loss. Your relationship to what or whom you lost. Your understanding of grief and how to manage it. Your support system. How to manage lifestyle changes Keep to your normal routine as much as possible. If you have trouble focusing or doing normal activities, it is acceptable to take some time away from your normal routine. Spend time with friends and loved ones. Eat a healthy diet, get plenty of sleep, and rest when you feel tired. How to recognize changes  The way that you deal with your grief will affect your ability to function as you normally do. When grieving, you may experience these changes: Numbness, shock, sadness, anxiety, anger, denial, and guilt. Thoughts about death. Unexpected crying. A physical sensation of emptiness in your stomach. Problems  sleeping and eating. Tiredness (fatigue). Loss of interest in normal activities. Dreaming about or imagining seeing the person who died. A need to remember what or whom you lost. Difficulty thinking about anything other than your loss for a period of time. Relief. If you have been expecting the loss for a while, you may feel a sense of relief when it happens. Follow these instructions at home:    Activity Express your feelings in healthy ways, such as: Talking with others about your loss. It may be helpful to find others who have had a similar loss, such as a support group. Writing down your feelings in a journal. Doing physical activities to release stress and emotional energy. Doing creative activities like painting, sculpting, or playing or listening to music. Practicing resilience. This is the ability to recover and adjust after facing challenges. Reading some resources that encourage resilience may help you to learn ways to practice those behaviors. General instructions Be patient with yourself and others. Allow the grieving process to happen, and remember that grieving takes time. It is likely that you may never feel completely done with some grief. You may find a way to move on while still cherishing memories and feelings about your loss. Accepting your loss is a process. It can  take months or longer to adjust. Keep all follow-up visits as told by your health care provider. This is important. Where to find support To get support for managing loss: Ask your health care provider for help and recommendations, such as grief counseling or therapy. Think about joining a support group for people who are managing a loss. Where to find more information You can find more information about managing loss from: American Society of Clinical Oncology: www.cancer.net American Psychological Association: TVStereos.ch Contact a health care provider if: Your grief is extreme and keeps getting  worse. You have ongoing grief that does not improve. Your body shows symptoms of grief, such as illness. You feel depressed, anxious, or lonely. Get help right away if: You have thoughts about hurting yourself or others. If you ever feel like you may hurt yourself or others, or have thoughts about taking your own life, get help right away. You can go to your nearest emergency department or call: Your local emergency services (911 in the [place] A suicide crisis helpline, such as the [place] at 386 715 4383. This is open 24 hours a day. Summary Grief is the result of a major change or an absence of someone or something that you count on. Grief is a normal reaction to loss. The depth of grief and the period of recovery depend on the type of loss and your ability to adjust to the change and process your feelings. Processing grief requires patience and a willingness to accept your feelings and talk about your loss with people who are supportive. It is important to find resources that work for you and to realize that people experience grief differently. There is not one grieving process that works for everyone in the same way. Be aware that when grief becomes extreme, it can lead to more severe issues like isolation, depression, anxiety, or suicidal thoughts. Talk with your health care provider if you have any of these issues. This information is not intended to replace advice given to you by your health care provider. Make sure you discuss any questions you have with your health care provider. Document Revised: 06/17/2018 Document Reviewed: 08/27/2016 Elsevier Patient Education  [place] for Managing Grief   When you are experiencing grief, many resources and support are available to help you. You are not alone.    Grief Share (https://www.https://jacobson-moore.net/):    [employer] [facility] [place], [place]  [phone] [address], [place], [place], [place], [place]   [facility] [address] [place], [place]   [place], [place]   If a Hospice or [place] team has been involved in the care of your loved one, you may reach out to your contact there or locally, you may reach out to [facility]:    Hospice and [facility]   [place], [place] [id number] [phone] Patient was educated on healthy self-care as she admits she has little motivation to walk lately. PCS recommendation was suggested.   Patient Self Care Activities:  Attends all scheduled provider appointments Calls provider office for new concerns or questions  Please see past updates related to this goal by clicking on the "Past Updates" button in the selected goal        Follow up:  Patient agrees to Care Plan and Follow-up.  Plan: The Managed Medicaid care management team will reach out to the patient again over the next 45 days.  Date/time of next scheduled Social Work care management/care coordination outreach:  01/19/22 at 1:00 pm.  Eula Fried, BSW, MSW, [place] [employer] LCSW [email or web address] Phone: [phone]

## 2021-12-11 NOTE — Telephone Encounter (Signed)
Pt can not get home health care at this time due to her insurance and the home health cares are not accepting her insurance.

## 2021-12-15 ENCOUNTER — Ambulatory Visit (INDEPENDENT_AMBULATORY_CARE_PROVIDER_SITE_OTHER): Payer: Medicaid Other | Admitting: Nurse Practitioner

## 2021-12-15 ENCOUNTER — Encounter (INDEPENDENT_AMBULATORY_CARE_PROVIDER_SITE_OTHER): Payer: Self-pay | Admitting: Nurse Practitioner

## 2021-12-15 ENCOUNTER — Other Ambulatory Visit: Payer: Self-pay | Admitting: Pharmacist

## 2021-12-15 ENCOUNTER — Other Ambulatory Visit: Payer: Self-pay

## 2021-12-15 VITALS — BP 132/78 | HR 75 | Resp 16

## 2021-12-15 DIAGNOSIS — I10 Essential (primary) hypertension: Secondary | ICD-10-CM

## 2021-12-15 DIAGNOSIS — I890 Lymphedema, not elsewhere classified: Secondary | ICD-10-CM

## 2021-12-15 NOTE — Patient Outreach (Signed)
Medicaid Managed Care Social Work Note  12/15/2021 Name:  Cheryl Chandler MRN:  [id number] DOB:  [birth year]  Cheryl Chandler is an 56 y.o. year old female who is a primary patient of [name], NP.  The Medicaid Managed Care Coordination team was consulted for assistance with:  Community Resources   Cheryl Chandler was given information about Medicaid Managed Care Coordination team services today. [place] Patient agreed to services and verbal consent obtained.  Engaged with patient  for by telephone forfollow up visit in response to referral for case management and/or care coordination services.   Assessments/Interventions:  Review of past medical history, allergies, medications, health status, including review of consultants reports, laboratory and other test data, was performed as part of comprehensive evaluation and provision of chronic care management services.  SDOH: (Social Determinant of Health) assessments and interventions performed: BSW completed a telephone outreach with patient. She stated everything is still going okay. She has been having problems with her leg wraps and has an appointment today. She states her husband is still having issues with his eye and with the doctor and she is going to make a compliant. Patient states no resources are needed at this time.  Advanced Directives Status:  Not addressed in this encounter.  Care Plan                 Allergies  Allergen Reactions   Codeine Shortness Of Breath and Rash   Percocet [Oxycodone-Acetaminophen] Shortness Of Breath   Sulfa Antibiotics Shortness Of Breath and Rash   Aspirin Hives   Bee Venom     Bee stings   Darvon [Propoxyphene]     Darvocet-N 100   Latex    Oxycodone Nausea And Vomiting   Penicillins     Has patient had a PCN reaction causing immediate rash, facial/tongue/throat swelling, SOB or lightheadedness with hypotension: Unknown Has patient had a PCN reaction causing  severe rash involving mucus membranes or skin necrosis: Unknown Has patient had a PCN reaction that required hospitalization: Unknown Has patient had a PCN reaction occurring within the last 10 years: Unknown If all of the above answers are "NO", then may proceed with Cephalosporin use.    Medications Reviewed Today     Reviewed by [name], LCSW (Social Worker) on 12/11/21 at [place] List Status: <None>   Medication Order Taking? Sig Documenting Provider Last Dose Status Informant  ACCU-CHEK GUIDE test strip [id number] No USE TO CHECK BLOOD SUGAR 3 TIMES DAILY AS DIRECTED [name], NP Taking Active   Accu-Chek Softclix Lancets lancets [id number] No CHECK BLOOD SUGAR TWICE DAILY [name], NP Taking Active   albuterol (PROVENTIL) (2.5 MG/3ML) 0.083% nebulizer solution [id number] No USE 1 VIAL VIA NEBULIZER EVERY 4 HOURS AS NEEDED [name], NP Taking Active   amLODipine (NORVASC) 5 MG tablet [id number] No TAKE 1 TABLET BY MOUTH ONCE DAILY [name], NP Taking Active   atorvastatin (LIPITOR) 80 MG tablet [id number] No TAKE 1 TABLET BY MOUTH ONCE EVERY [name], NP Taking Active   Blood Glucose Monitoring Suppl (ACCU-CHEK GUIDE) w/Device KIT [id number] No Use to check blood sugar 3 times a day. [name], NP Taking Active   Blood Pressure Monitoring (OMRON 3 SERIES BP MONITOR) DEVI [id number] No Use to check blood pressure as directed [name], NP Taking Active   citalopram (CELEXA) 40 MG tablet [id number] No TAKE 1 TABLET BY MOUTH ONCE DAILY [name], NP Taking Active  EPINEPHrine 0.3 mg/0.3 mL IJ SOAJ injection [id number] No INJECT 1 SYRINGE INTO OUTER THIGH ONCE AS NEEDED FOR SEVERE ALLERGIC REACTION. [name], NP Taking Active   furosemide (LASIX) 20 MG tablet [id number] No TAKE 1 TABLET BY MOUTH ONCE DAILY IN AFTERNOON AS NEEDED LEG EDEMA [name], NP Taking Active   gabapentin (NEURONTIN) 300 MG  capsule [id number] No TAKE 1 CAPSULE BY MOUTH 3 TIMES DAILY ASNEEDED (NEED OFFICE VISIT FOR FURTHER REFILLS) [name], NP Taking Active   hydrALAZINE (APRESOLINE) 25 MG tablet [id number] No Take 1 tablet (25 mg total) by mouth 3 (three) times daily. [name], NP Taking Active            Med Note Jodi Mourning, Grace Bushy   Fri Dec 05, 2021  3:07 PM) Twice daily  levothyroxine (SYNTHROID) 50 MCG tablet [id number] No TAKE 1 TABLET BY MOUTH ONCE DAILY ON AN EMPTY STOMACH. WAIT 30 MINUTES BEFORE TAKING OTHER MEDS. [name], NP Taking Active   lisinopril (ZESTRIL) 40 MG tablet [id number] No TAKE 1 TABLET BY MOUTH ONCE DAILY [name], NP Taking Active   meloxicam (MOBIC) 15 MG tablet [id number] No TAKE 1 TABLET BY MOUTH ONCE DAILY AS NEEDED WITH FOOD OR MILK [name], NP Taking Active   methocarbamol (ROBAXIN) 500 MG tablet [id number] No Take 500 mg by mouth every 8 (eight) hours as needed for muscle spasms. [provider] Taking Active   mometasone-formoterol (DULERA) 100-5 MCG/ACT AERO [id number] No INHALE 2 PUFFS BY MOUTH TWICE A DAY. RINSE MOUTH AFTER USE. [name], NP Taking Active   montelukast (SINGULAIR) 10 MG tablet [id number] No TAKE 1 TABLET BY MOUTH AT BEDTIME [name], NP Taking Active   nitroGLYCERIN (NITROSTAT) 0.4 MG SL tablet [id number] No DISSOLVE 1 TABLET UNDER TONGUE AT ONSET OF CHEST PAIN. REPEAT IN 5 MIN IF NOT RESOLVED, MAX 3 DOSES. 911 IF NEEDED. [name], NP Taking Active   omeprazole (PRILOSEC) 20 MG capsule [id number] No TAKE 1 CAPSULE BY MOUTH ONCE DAILY [name], NP Taking Active   SPIRIVA HANDIHALER 18 MCG inhalation capsule [id number] No INHALE CONTENTS OF 1 CAPSULE ONCE DAILY [name], NP Taking Active   spironolactone (ALDACTONE) 25 MG tablet [id number] No Take 0.5 tablets (12.5 mg total) by mouth daily. [name], NP Taking Active   triamcinolone cream (KENALOG) 0.1 % [id number] No  APPLY 1 APPLICATION TOPICALLY TWICE DAILY [name], [name], NP Taking Active   triamcinolone cream (KENALOG) 0.1 % [id number] No Apply 1 application. topically 2 (two) times daily. [name], NP Taking Active   TRULICITY 2.11 ZN/3.5AP [name] [id number] No INJECT 0.75MG SUBQ ONCE A [name], NP Taking Active   Vitamin D, Ergocalciferol, (DRISDOL) 1.25 MG (50000 UNIT) CAPS capsule [id number] No TAKE 1 CAPSULE BY MOUTH EVERY 7 DAYS [name], NP Taking Active             Patient Active Problem List   Diagnosis Date Noted   Foot pain, right 09/01/2021   Fibrocystic disease of both breasts 05/07/2019   IFG (impaired fasting glucose) 05/03/2019   Chronic venous insufficiency 11/09/2018   Overactive bladder 09/30/2018   Peripheral neuropathy 09/30/2018   Apnea 01/02/2016   Lymphedema 11/30/2014   Dyspnea on exertion 11/30/2014   Anxiety 11/29/2014   Arthritis 11/29/2014   COPD (chronic obstructive pulmonary disease) ([place]) 11/29/2014   Clinical depression 11/29/2014   H/O eating disorder 11/29/2014   Esophageal reflux 11/29/2014   Acne inversa 11/29/2014   Essential hypertension 11/29/2014  HLD (hyperlipidemia) 11/29/2014   Adult hypothyroidism 11/29/2014   Insomnia 11/29/2014   Low back pain 11/29/2014   Morbid obesity ([place]) 11/29/2014   Posttraumatic stress disorder 11/29/2014   Vitamin D deficiency 11/29/2014   Nicotine dependence, cigarettes, uncomplicated 93/81/0175    Conditions to be addressed/monitored per PCP order:   community resources  Care Plan : General Social Work (Adult)  Updates made by Ethelda Chick since 12/15/2021 12:00 AM     Problem: Coping Skills (General Plan of Care)      Long-Range Goal: Coping Skills Enhanced   Start Date: 11/07/2020  Expected End Date: 03/07/2022  Recent Progress: On track  Priority: High  Note:   Timeframe:  Long-Range Goal Priority:  High Start Date:       08/26/21                    Expected  End Date:  ongoing  Follow up date: 01/19/22 at 1:00 pm  Current Barriers:  Financial constraints Mental Health Concerns  Social Isolation Limited education about mental health support resources that are available to her within the local area* Cognitive Deficits Lacks knowledge of community resource  Clinical Social Work Clinical Goal(s):  Over the next 90 days, client will work with SW to address concerns related to experiencing ongoing symptoms of depression, sadness and grief since the passing of her mother Over the next 90 days, patient will work with LCSW to address needs related to implementing appropriate self-care and depression management.   Interventions: Patient interviewed and appropriate assessments performed Provided mental health counseling with regards to patient's loss and ongoing stressors. Patient was very close with her mother who passed away a few years ago and she continues to experience symptoms of grief from this loss. Patient denies wanting LCSW to make a referral for grief counseling through [facility] again but was receptive to coping skill and resource education/information that was provided during session today. Patient reports decorating for the holidays helps her to connect with her mother as they did this together when she was alive.  Provided patient with information about managing depression within her daily life. Patient has ongoing grief symptoms as well. Patient states "I feel worn down." Patient reports that she talks out loud to her mother's picture and this is very helpful to her. Grief support resource education provided again during session today.  Provided reflective listening and implemented appropriate interventions to help suppport patient and her emotional needs  Discussed plans with patient for ongoing care management follow up and provided patient with direct contact information for [facility] team Advised patient to contact [facility] team with any urgent  case management needs. Assisted patient/caregiver with obtaining information about health plan benefits LCSW completed past referral for family to have a wheelchair ramp built at their residence due to ongoing mobility concerns. Ramp has been successfully installed.  PCS actively involved with patient and providing ongoing services.  Positive reinforcement provided to patient for quitting smoking cigarettes. Coping skill review education provided for future triggers.  Brief self-care education provided to both patient and spouse. Patient confirms that she continues to go to food pantry for food insecurity. Resource information has been provided.  Patient reports ongoing pain and issues with mobility. Patient shares that her pain resides in her pains and legs. Emotional support and coping skill education provided. Patient confirms that her PCS aide continues to provide her with services. However, aide has to be reminded to put on her mask since  patient is high risk. LCSW reviewed resources that [name] provided her with in the past.  Patient reports that she had a wonderful birthday spent with her family this past May. Patient reports receiving appropriate socialization. Patient reports that her spouse took her out to eat and gifted her with several things which was a nice surprise.  Patient confirms stable transportation arrangements for upcoming PCP appointment this week.  Patient repots that she is losing a lot of weight but is not sure how much she has lost as she does not have a scale. Patient reports that a goal of hers is to lose weight but she has not been intentionally losing weight at this time.  Patient reports that her aide [name] continues to assist her with PCS and is an excellent support within the home for her and spouse.  Patient planted a tomato plant and successfully grew over 5 tomatoes over the summer. Patient is very proud of this achievement as this was her first time planting  a tomato plant and is now interested in picking up gardening/planting as a hobby. Patient reports that her aunt continues to be a positive support for her in her life and someone that she can rely on in the case of emergencies.  Patient was educated on healthy self-care skills and advised her to use these into her daily routine.  04/10/21- Update- Patient is agreeable to social work closure at this time as she still does not need medication management or counseling as she is managing her mental health well at this time. Patient talks to her younger sister everyday to gain emotional support. Patient says that her sister is into body building and helps to educate and remind her to do her own self-care. Patient is agreeable to contact [facility] LCSW directly if any social work concern arise. [facility] LCSW will complete case closure at this time.  Patient reports that she has been elevating her legs daily to elevate pain and swelling.  [facility] LCSW completed care coordination with Thomasene Lot on 01/15/21 and [facility] LCSW provided the following suggested resource-It is likely their best option given their loss of housing and lack of consistent income:  Publishing rights manager                                                                                                                      (Emergency [place]) [phone] Ext. 104 M-F, 8am - 4pm Patient was successfully approved for food stamps but is still experiencing financial concerns. She reports that she and her husband have no clothes that fit them and they are having to start using a string for their pants and patient only has one bra at this time. [facility] LCSW will send referral to [facility] BSW today for financial support.  BSW spoke with patient about resources needed. Patient states she does receive SSI and was approved for foodstamps. Patient is familiar with all of the food pantries and other resources in [place]. BSW will  assist patient with  finding an eye doctor. Patient states she has not been in 10 years. BSW will research and put a list of eye doctors together that accept Medicaid in [facility]. 04/30/20: BSW contacted and spoke with patient, she stated she was not doing good because she found out that she has a new landlord and they are going up on her rent. BSW informed patient to make sure she informs her social workers at [employer] that they have an increase for foodstamps and Medicaid. Patient stated that she sleeps on the couch and is in need of a power recliner/lift chair. BSW will send patients PCP a message asking for a script to be sent. 05/21/21: BSW completed follow up telephone call with patient. She states she is unsure if their new landlord has received their January rent. Patient states she is also worried due to the new landlord making them pay rent online and on the 1st of the month. Patient states she is unable to get anyone on the phone. Patient states her rent will go up to 400 from 255 in March. Patient states she does not have many clothes but goes to [employer] to get some Materials engineer. No other resources needed at this time.   06/20/21: BSW completed follow up telelphone call with patient. Patient states she is doing well. Patient states [employer] [place] will cover all of Vaccines. Patient states no resources are needed at this time. 08/26/21: BSW completed telephone outreach with patient, she stated since going to the hospital she has been feeling weak. Patient states other than that she is feeling okay. Patient stated she is going to get a new phone on Wednesday. Patient states her birthday is on Friday and her husband may take her to a [place]. No other resources are needed at this time.Update- Patient reports experiencing additional grief lately. [facility] LCSW offered referral for berievement counseling but she declined. UPDATE 08/26/21- Patient reports that she has a near death experience last month. Patient shares  that she had a fall. 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 [facility] LCSW completed education on available mental health resources within her area to utilize. Patient admits to experiencing some sadness due to grief from her mother's passing. Emotional support provided. 10/13/21 UPDATE- Patient reports that she is working hard on living a healthier life both physically and mentally. Patient reports that she is making active changes in her diet. She reports that she eventually wishes to quit smoking again and has started picking up new hobbies to implement during times of craving such as coloring, crocheting and being creative. Patient reports that she is unsure if she needs mental health support at this time because her main concerns are centered around her and her spouse's physical health instead their mental health. [facility] LCSW educated patient on how they affect one another and their importance. Patient denies any urgent concerns. She reports no issues with affording food due to food stamp assistance. [facility] LCSW will reassess social work needs 30 days from now. UPDATE 11/17/21- Patient reports that she is experiencing several stressors at this time. She was encouraged to consider mental health treatment but she is not interested at this time. Patient was encouraged to consider mental health resources. Patient reports that their car is in the shop and that they have no stable transportation besides their sister and her husband's father. Patient reports that her spouse has had several recent health issues that have affected her mental state. Patient was educated  on healthy coping skills for stress. Patient denies no urgent concerns or issues at this time. UPDATE- Care Coordination completed with [facility] LCSW, PCP office, Pharmacist, [facility] BSW and PCP. Patient has canceled several medical appointments because they are unable to afford a copay. Patient will benefit from financial assistance and may benefit from a [facility] application. Patient has an upcoming appointment with [facility] BSW next week. Patient is agreeable to exploring financial assistance options with [facility] BSW that will help alleviate other areas of financial stress within their life in order for patient to be able to afford medical expenses. [facility] LCSW encouraged patient to consider individual counseling. Patient is agreeable to referral for counseling but needs to find out her sister's email first as she is unable to go to office to fill out intake paperwork. Patient prefers virtual counseling to save money on transportation. [facility] LCSW will successfully place referral to [facility] during next outreach. [facility] LCSW completed care coordination with [facility] BSW.  BSW 12/15/21: BSW completed a telephone outreach with patient. She stated everything is still going okay. She has been having problems with her leg wraps and has an appointment today. She states her husband is still having issues with his eye and with the doctor and she is going to make a compliant. Patient states no resources are needed at this time.  Managing Loss, Adult People experience loss in many different ways throughout their lives. Events such as moving, changing jobs, and losing friends can create a sense of loss. The loss may be as serious as a major health change, divorce, death of a pet, or death of a loved one. All of these types of loss are likely to create a physical and emotional reaction known as grief. Grief is the result of a major change or an absence of something or someone that you count on. Grief is a normal reaction to loss. A variety of factors can affect your grieving experience, including: The nature of your loss. Your relationship to what or whom you lost. Your understanding of grief and how to manage it. Your support system. How to manage lifestyle changes Keep to your normal routine as much as possible. If you have trouble focusing or doing normal activities, it is  acceptable to take some time away from your normal routine. Spend time with friends and loved ones. Eat a healthy diet, get plenty of sleep, and rest when you feel tired. How to recognize changes  The way that you deal with your grief will affect your ability to function as you normally do. When grieving, you may experience these changes: Numbness, shock, sadness, anxiety, anger, denial, and guilt. Thoughts about death. Unexpected crying. A physical sensation of emptiness in your stomach. Problems sleeping and eating. Tiredness (fatigue). Loss of interest in normal activities. Dreaming about or imagining seeing the person who died. A need to remember what or whom you lost. Difficulty thinking about anything other than your loss for a period of time. Relief. If you have been expecting the loss for a while, you may feel a sense of relief when it happens. Follow these instructions at home:    Activity Express your feelings in healthy ways, such as: Talking with others about your loss. It may be helpful to find others who have had a similar loss, such as a support group. Writing down your feelings in a journal. Doing physical activities to release stress and emotional energy. Doing creative activities like painting, sculpting, or playing or  listening to music. Practicing resilience. This is the ability to recover and adjust after facing challenges. Reading some resources that encourage resilience may help you to learn ways to practice those behaviors. General instructions Be patient with yourself and others. Allow the grieving process to happen, and remember that grieving takes time. It is likely that you may never feel completely done with some grief. You may find a way to move on while still cherishing memories and feelings about your loss. Accepting your loss is a process. It can take months or longer to adjust. Keep all follow-up visits as told by your health care provider. This is  important. Where to find support To get support for managing loss: Ask your health care provider for help and recommendations, such as grief counseling or therapy. Think about joining a support group for people who are managing a loss. Where to find more information You can find more information about managing loss from: American Society of Clinical Oncology: www.cancer.net American Psychological Association: TVStereos.ch Contact a health care provider if: Your grief is extreme and keeps getting worse. You have ongoing grief that does not improve. Your body shows symptoms of grief, such as illness. You feel depressed, anxious, or lonely. Get help right away if: You have thoughts about hurting yourself or others. If you ever feel like you may hurt yourself or others, or have thoughts about taking your own life, get help right away. You can go to your nearest emergency department or call: Your local emergency services (911 in the [place] A suicide crisis helpline, such as the [place] at 231 416 1776. This is open 24 hours a day. Summary Grief is the result of a major change or an absence of someone or something that you count on. Grief is a normal reaction to loss. The depth of grief and the period of recovery depend on the type of loss and your ability to adjust to the change and process your feelings. Processing grief requires patience and a willingness to accept your feelings and talk about your loss with people who are supportive. It is important to find resources that work for you and to realize that people experience grief differently. There is not one grieving process that works for everyone in the same way. Be aware that when grief becomes extreme, it can lead to more severe issues like isolation, depression, anxiety, or suicidal thoughts. Talk with your health care provider if you have any of these issues. This information is not intended to replace advice  given to you by your health care provider. Make sure you discuss any questions you have with your health care provider. Document Revised: 06/17/2018 Document Reviewed: 08/27/2016 Elsevier Patient Education  [place] for Managing Grief   When you are experiencing grief, many resources and support are available to help you. You are not alone.    Grief Share ([email or web address]):    [employer] [facility] [place], [place]  [phone], [place], [place], [place], [place]   [facility] [place], [place]   [place], [place]  [phone]   If a Hospice or Palliative Care team has been involved in the care of your loved one, you may reach out to your contact there or locally, you may reach out to [facility]   [place], [place] [address] [phone] Patient was educated on healthy self-care as she admits she has little motivation to walk lately. PCS recommendation was suggested.   Patient Self Care Activities:  Attends all scheduled provider appointments Calls provider office for new concerns or questions  Please see past updates related to this goal by clicking on the "Past Updates" button in the selected goal        Follow up:  Patient agrees to Care Plan and Follow-up.  Plan: The Managed Medicaid care management team will reach out to the patient again over the next 30 days.  Date/time of next scheduled Social Work care management/care coordination outreach:  01/15/22  Mickel Fuchs, Arita Miss, [place] Medicaid Team  [phone]

## 2021-12-15 NOTE — Chronic Care Management (AMB) (Signed)
Care Coordination:  Contacted patient to remind of appointment tomorrow with PCP. Appears BP was much improved at Vascular appointment last week. Denies any lightheadedness, dizziness, signs of dehydration. Encouraged to keep PCP appointment tomorrow.   Catie Eppie Gibson, PharmD, [facility] [phone]

## 2021-12-15 NOTE — Progress Notes (Signed)
History of Present Illness  There is no documented history at this time  Assessments & Plan   There are no diagnoses linked to this encounter.    Additional instructions  Subjective:  Patient presents with venous ulcer of the Bilateral lower extremity.    Procedure:  3 layer unna wrap was placed Bilateral lower extremity.   Plan:   Follow up in one week.  

## 2021-12-15 NOTE — Patient Instructions (Signed)
Visit Information  Cheryl Chandler was given information about Medicaid Managed Care team care coordination services as a part of their [facility] Medicaid benefit. Cheryl Chandler verbally consented to engagement with the [facility] Managed Care team.   If you are experiencing a medical emergency, please call 911 or report to your local emergency department or urgent care.   If you have a non-emergency medical problem during routine business hours, please contact your provider's office and ask to speak with a nurse.   For questions related to your [facility] health plan, please call: [phone] or go here:[email or web address]  If you would like to schedule transportation through your [facility] plan, please call the following number at least 2 days in advance of your appointment: [phone].  You can also use the MTM portal or MTM mobile app to manage your rides. For the portal, please go to [email or web address]  Call the [facility] Crisis Line at [phone], at any time, 24 hours a day, 7 days a week. If you are in danger or need immediate medical attention call 911.  If you would like help to quit smoking, call 1-800-QUIT-NOW (616 068 1661) OR Espaol: 1-855-Djelo-Ya (4-132-440-1027) o para ms informacin haga clic aqu or Text READY to 253-664 to register via text  Cheryl Chandler - following are the goals we discussed in your visit today:   Goals Addressed   None      Social Worker will follow up in 30 days .   [name], BSW, [place] [facility]  [place]  High Risk Managed Medicaid Team  [phone]   Following is a copy of your plan of care:  Care Plan : General Social Work (Adult)  Updates made by [name] since 12/15/2021 12:00 AM     Problem: Coping Skills (General Plan of Care)      Long-Range Goal: Coping Skills Enhanced   Start Date: 11/07/2020  Expected End Date: 03/07/2022  Recent Progress: On  track  Priority: High  Note:   Timeframe:  Long-Range Goal Priority:  High Start Date:       08/26/21                    Expected End Date:  ongoing  Follow up date: 01/19/22 at 1:00 pm  Current Barriers:  Financial constraints Mental Health Concerns  Social Isolation Limited education about mental health support resources that are available to her within the local area* Cognitive Deficits Lacks knowledge of community resource  Clinical Social Work Clinical Goal(s):  Over the next 90 days, client will work with SW to address concerns related to experiencing ongoing symptoms of depression, sadness and grief since the passing of her mother Over the next 90 days, patient will work with LCSW to address needs related to implementing appropriate self-care and depression management.   Interventions: Patient interviewed and appropriate assessments performed Provided mental health counseling with regards to patient's loss and ongoing stressors. Patient was very close with her mother who passed away a few years ago and she continues to experience symptoms of grief from this loss. Patient denies wanting LCSW to make a referral for grief counseling through [facility] again but was receptive to coping skill and resource education/information that was provided during session today. Patient reports decorating for the holidays helps her to connect with her mother as they did this together when she was alive.  Provided patient with information about managing depression within her daily life. Patient has ongoing grief symptoms  as well. Patient states "I feel worn down." Patient reports that she talks out loud to her mother's picture and this is very helpful to her. Grief support resource education provided again during session today.  Provided reflective listening and implemented appropriate interventions to help suppport patient and her emotional needs  Discussed plans with patient for ongoing care  management follow up and provided patient with direct contact information for [facility] team Advised patient to contact [facility] team with any urgent case management needs. Assisted patient/caregiver with obtaining information about health plan benefits LCSW completed past referral for family to have a wheelchair ramp built at their residence due to ongoing mobility concerns. Ramp has been successfully installed.  PCS actively involved with patient and providing ongoing services.  Positive reinforcement provided to patient for quitting smoking cigarettes. Coping skill review education provided for future triggers.  Brief self-care education provided to both patient and spouse. Patient confirms that she continues to go to food pantry for food insecurity. Resource information has been provided.  Patient reports ongoing pain and issues with mobility. Patient shares that her pain resides in her pains and legs. Emotional support and coping skill education provided. Patient confirms that her PCS aide continues to provide her with services. However, aide has to be reminded to put on her mask since patient is high risk. LCSW reviewed resources that [name] provided her with in the past.  Patient reports that she had a wonderful birthday spent with her family this past May. Patient reports receiving appropriate socialization. Patient reports that her spouse took her out to eat and gifted her with several things which was a nice surprise.  Patient confirms stable transportation arrangements for upcoming PCP appointment this week.  Patient repots that she is losing a lot of weight but is not sure how much she has lost as she does not have a scale. Patient reports that a goal of hers is to lose weight but she has not been intentionally losing weight at this time.  Patient reports that her aide [place] continues to assist her with PCS and is an excellent support within the home for her and spouse.  Patient planted a tomato  plant and successfully grew over 5 tomatoes over the summer. Patient is very proud of this achievement as this was her first time planting a tomato plant and is now interested in picking up gardening/planting as a hobby. Patient reports that her aunt continues to be a positive support for her in her life and someone that she can rely on in the case of emergencies.  Patient was educated on healthy self-care skills and advised her to use these into her daily routine.  04/10/21- Update- Patient is agreeable to social work closure at this time as she still does not need medication management or counseling as she is managing her mental health well at this time. Patient talks to her younger sister everyday to gain emotional support. Patient says that her sister is into body building and helps to educate and remind her to do her own self-care. Patient is agreeable to contact [facility] LCSW directly if any social work concern arise. [facility] LCSW will complete case closure at this time.  Patient reports that she has been elevating her legs daily to elevate pain and swelling.  [facility] LCSW completed care coordination with Osa Craver on 01/15/21 and [facility] LCSW provided the following suggested resource-It is likely their best option given their loss of housing and lack of consistent income:  Research scientist (physical sciences)                                                                                                                      (Emergency Crisis Housing) [phone] Ext. 104 M-F, 8am - 4pm Patient was successfully approved for food stamps but is still experiencing financial concerns. She reports that she and her husband have no clothes that fit them and they are having to start using a string for their pants and patient only has one bra at this time. [facility] LCSW will send referral to [facility] BSW today for financial support.  BSW spoke with patient about resources needed. Patient states she does receive SSI and was  approved for foodstamps. Patient is familiar with all of the food pantries and other resources in [place]. BSW will assist patient with finding an eye doctor. Patient states she has not been in 10 years. BSW will research and put a list of eye doctors together that accept Medicaid in [facility]. 04/30/20: BSW contacted and spoke with patient, she stated she was not doing good because she found out that she has a new landlord and they are going up on her rent. BSW informed patient to make sure she informs her social workers at [employer] that they have an increase for foodstamps and Medicaid. Patient stated that she sleeps on the couch and is in need of a power recliner/lift chair. BSW will send patients PCP a message asking for a script to be sent. 05/21/21: BSW completed follow up telephone call with patient. She states she is unsure if their new landlord has received their January rent. Patient states she is also worried due to the new landlord making them pay rent online and on the 1st of the month. Patient states she is unable to get anyone on the phone. Patient states her rent will go up to 400 from 255 in March. 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 No other resources needed at this time.   06/20/21: BSW completed follow up telelphone call with patient. Patient states she is doing well. Patient states Rolene Arbour will cover all of Vaccines. Patient states no resources are needed at this time. 08/26/21: BSW completed telephone outreach with patient, she stated since going to the hospital she has been feeling weak. Patient states other than that she is feeling okay. Patient stated she is going to get a new phone on Wednesday. Patient states her birthday is on Friday and her husband may take her to a [place]. No other resources are needed at this time.Update- Patient reports experiencing additional grief lately. [facility] LCSW offered  referral for berievement counseling but she declined. UPDATE 08/26/21- Patient reports that she has a near death experience last month. Patient shares that she had a fall. 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 [facility] LCSW completed education on available mental health resources within her area to utilize. Patient admits  to experiencing some sadness due to grief from her mother's passing. Emotional support provided. 10/13/21 UPDATE- Patient reports that she is working hard on living a healthier life both physically and mentally. Patient reports that she is making active changes in her diet. She reports that she eventually wishes to quit smoking again and has started picking up new hobbies to implement during times of craving such as coloring, crocheting and being creative. Patient reports that she is unsure if she needs mental health support at this time because her main concerns are centered around her and her spouse's physical health instead their mental health. [facility] LCSW educated patient on how they affect one another and their importance. Patient denies any urgent concerns. She reports no issues with affording food due to food stamp assistance. [facility] LCSW will reassess social work needs 30 days from now. UPDATE 11/17/21- Patient reports that she is experiencing several stressors at this time. She was encouraged to consider mental health treatment but she is not interested at this time. Patient was encouraged to consider mental health resources. Patient reports that their car is in the shop and that they have no stable transportation besides their sister and her husband's father. Patient reports that her spouse has had several recent health issues that have affected her mental state. Patient was educated on healthy coping skills for stress. Patient denies no urgent concerns or issues at this time. UPDATE- Care Coordination completed with [facility] LCSW, PCP office, Pharmacist, [facility] BSW and PCP. Patient has canceled  several medical appointments because they are unable to afford a copay. Patient will benefit from financial assistance and may benefit from a [facility] application. Patient has an upcoming appointment with [facility] BSW next week. Patient is agreeable to exploring financial assistance options with [employer] BSW that will help alleviate other areas of financial stress within their life in order for patient to be able to afford medical expenses. [facility] LCSW encouraged patient to consider individual counseling. Patient is agreeable to referral for counseling but needs to find out her sister's email first as she is unable to go to office to fill out intake paperwork. Patient prefers virtual counseling to save money on transportation. [facility] LCSW will successfully place referral to [facility] during next outreach. [facility] LCSW completed care coordination with [facility] BSW.  BSW 12/15/21: BSW completed a telephone outreach with patient. She stated everything is still going okay. She has been having problems with her leg wraps and has an appointment today. She states her husband is still having issues with his eye and with the doctor and she is going to make a compliant. Patient states no resources are needed at this time.  Managing Loss, Adult People experience loss in many different ways throughout their lives. Events such as moving, changing jobs, and losing friends can create a sense of loss. The loss may be as serious as a major health change, divorce, death of a pet, or death of a loved one. All of these types of loss are likely to create a physical and emotional reaction known as grief. Grief is the result of a major change or an absence of something or someone that you count on. Grief is a normal reaction to loss. A variety of factors can affect your grieving experience, including: The nature of your loss. Your relationship to what or whom you lost. Your understanding of grief and how to manage it. Your support system. How  to manage lifestyle changes Keep to your normal routine as  much as possible. If you have trouble focusing or doing normal activities, it is acceptable to take some time away from your normal routine. Spend time with friends and loved ones. Eat a healthy diet, get plenty of sleep, and rest when you feel tired. How to recognize changes  The way that you deal with your grief will affect your ability to function as you normally do. When grieving, you may experience these changes: Numbness, shock, sadness, anxiety, anger, denial, and guilt. Thoughts about death. Unexpected crying. A physical sensation of emptiness in your stomach. Problems sleeping and eating. Tiredness (fatigue). Loss of interest in normal activities. Dreaming about or imagining seeing the person who died. A need to remember what or whom you lost. Difficulty thinking about anything other than your loss for a period of time. Relief. If you have been expecting the loss for a while, you may feel a sense of relief when it happens. Follow these instructions at home:    Activity Express your feelings in healthy ways, such as: Talking with others about your loss. It may be helpful to find others who have had a similar loss, such as a support group. Writing down your feelings in a journal. Doing physical activities to release stress and emotional energy. Doing creative activities like painting, sculpting, or playing or listening to music. Practicing resilience. This is the ability to recover and adjust after facing challenges. Reading some resources that encourage resilience may help you to learn ways to practice those behaviors. General instructions Be patient with yourself and others. Allow the grieving process to happen, and remember that grieving takes time. It is likely that you may never feel completely done with some grief. You may find a way to move on while still cherishing memories and feelings about your loss. Accepting  your loss is a process. It can take months or longer to adjust. Keep all follow-up visits as told by your health care provider. This is important. Where to find support To get support for managing loss: Ask your health care provider for help and recommendations, such as grief counseling or therapy. Think about joining a support group for people who are managing a loss. Where to find more information You can find more information about managing loss from: American Society of Clinical Oncology: www.cancer.net American Psychological Association: DiceTournament.cawww.apa.org Contact a health care provider if: Your grief is extreme and keeps getting worse. You have ongoing grief that does not improve. Your body shows symptoms of grief, such as illness. You feel depressed, anxious, or lonely. Get help right away if: You have thoughts about hurting yourself or others. If you ever feel like you may hurt yourself or others, or have thoughts about taking your own life, get help right away. You can go to your nearest emergency department or call: Your local emergency services (911 in the [place].S[place] A suicide crisis helpline, such as the National Suicide Prevention Lifeline at 917-508-53041-(908) 119-6685. This is open 24 hours a day. Summary Grief is the result of a major change or an absence of someone or something that you count on. Grief is a normal reaction to loss. The depth of grief and the period of recovery depend on the type of loss and your ability to adjust to the change and process your feelings. Processing grief requires patience and a willingness to accept your feelings and talk about your loss with people who are supportive. It is important to find resources that work for you and to realize that people  experience grief differently. There is not one grieving process that works for everyone in the same way. Be aware that when grief becomes extreme, it can lead to more severe issues like isolation, depression, anxiety, or  suicidal thoughts. Talk with your health care provider if you have any of these issues. This information is not intended to replace advice given to you by your health care provider. Make sure you discuss any questions you have with your health care provider. Document Revised: 06/17/2018 Document Reviewed: 08/27/2016 Elsevier Patient Education  2020 [employer].  Help for Managing Grief   When you are experiencing grief, many resources and support are available to help you. You are not alone.    Grief Share ([email or web address]):    [employer] of [address] [place], [place]      [employer]  [phone] [address] [place], [place]     [employer] [address] [place], [place]      [facility] [address] [place], [place]  [phone]   [facility] [address] [place], [place] [phone]   [facility] [address] [place], [place]     [phone]   [facility] [address] , [place]     [phone]   If a Hospice or Palliative Care team has been involved in the care of your loved one, you may reach out to your contact there or locally, you may reach out to [facility]:    Hospice and Palliative Care of [facility]   [address] [place], [place] [address] [phone] Patient was educated on healthy self-care as she admits she has little motivation to walk lately. PCS recommendation was suggested.   Patient Self Care Activities:  Attends all scheduled provider appointments Calls provider office for new concerns or questions  Please see past updates related to this goal by clicking on the "Past Updates" button in the selected goal

## 2021-12-15 NOTE — Progress Notes (Unsigned)
   LMP 06/09/2017 (Approximate)    Subjective:    Patient ID: Cheryl Chandler, female    DOB: 1966/04/21, 56 y.o.   MRN: [id number]  HPI: Cheryl Chandler is a 56 y.o. female  No chief complaint on file.  HYPERTENSION Hypertension status: {Blank single:19197::"controlled","uncontrolled","better","worse","exacerbated","stable"}  Satisfied with current treatment? {Blank single:19197::"yes","no"} Duration of hypertension: {Blank single:19197::"chronic","months","years"} BP monitoring frequency:  {Blank single:19197::"not checking","rarely","daily","weekly","monthly","a few times a day","a few times a week","a few times a month"} BP range:  BP medication side effects:  {Blank single:19197::"yes","no"} Medication compliance: {Blank single:19197::"excellent compliance","good compliance","fair compliance","poor compliance"} Previous BP meds:{Blank VKPQAESL:75300::"FRTM","YTRZNBVAPO","LIDCVUDTHY/HOOILNZVJK","QASUORVI","FBPPHKFEXM","DYJWLKHVFM/BBUY","ZJQDUKRCVK (bystolic)","carvedilol","chlorthalidone","clonidine","diltiazem","exforge HCT","HCTZ","irbesartan (avapro)","labetalol","lisinopril","lisinopril-HCTZ","losartan (cozaar)","methyldopa","nifedipine","olmesartan (benicar)","olmesartan-HCTZ","quinapril","ramipril","spironalactone","tekturna","valsartan","valsartan-HCTZ","verapamil"} Aspirin: {Blank single:19197::"yes","no"} Recurrent headaches: {Blank single:19197::"yes","no"} Visual changes: {Blank single:19197::"yes","no"} Palpitations: {Blank single:19197::"yes","no"} Dyspnea: {Blank single:19197::"yes","no"} Chest pain: {Blank single:19197::"yes","no"} Lower extremity edema: {Blank single:19197::"yes","no"} Dizzy/lightheaded: {Blank single:19197::"yes","no"}  Relevant past medical, surgical, family and social history reviewed and updated as indicated. Interim medical history since our last visit reviewed. Allergies and medications reviewed and updated.  Review of  Systems  Per HPI unless specifically indicated above     Objective:    LMP 06/09/2017 (Approximate)   Wt Readings from Last 3 Encounters:  07/28/21 (!) 320 lb (145.2 kg)  06/04/21 (!) 331 lb (150.1 kg)  02/05/21 290 lb (131.5 kg)    Physical Exam  Results for orders placed or performed in visit on 09/18/21  Comp Met (CMET)  Result Value Ref Range   Glucose 87 70 - 99 mg/dL   BUN 14 6 - 24 mg/dL   Creatinine, Ser 0.75 0.57 - 1.00 mg/dL   eGFR 94 >59 mL/min/1.73   BUN/Creatinine Ratio 19 9 - 23   Sodium 138 134 - 144 mmol/L   Potassium 4.5 3.5 - 5.2 mmol/L   Chloride 101 96 - 106 mmol/L   CO2 25 20 - 29 mmol/L   Calcium 8.7 8.7 - 10.2 mg/dL   Total Protein 6.6 6.0 - 8.5 g/dL   Albumin 3.6 (L) 3.8 - 4.9 g/dL   Globulin, Total 3.0 1.5 - 4.5 g/dL   Albumin/Globulin Ratio 1.2 1.2 - 2.2   Bilirubin Total 0.3 0.0 - 1.2 mg/dL   Alkaline Phosphatase 96 44 - 121 IU/L   AST 15 0 - 40 IU/L   ALT 19 0 - 32 IU/L  TSH  Result Value Ref Range   TSH 2.260 0.450 - 4.500 uIU/mL  T4, free  Result Value Ref Range   Free T4 1.16 0.82 - 1.77 ng/dL  HgB A1c  Result Value Ref Range   Hgb A1c MFr Bld 5.8 (H) 4.8 - 5.6 %   Est. average glucose Bld gHb Est-mCnc 120 mg/dL  Vitamin D (25 hydroxy)  Result Value Ref Range   Vit D, 25-Hydroxy 34.0 30.0 - 100.0 ng/mL  Lipid Profile  Result Value Ref Range   Cholesterol, Total 130 100 - 199 mg/dL   Triglycerides 99 0 - 149 mg/dL   HDL 33 (L) >39 mg/dL   VLDL Cholesterol Cal 19 5 - 40 mg/dL   LDL Chol Calc (NIH) 78 0 - 99 mg/dL   Chol/HDL Ratio 3.9 0.0 - 4.4 ratio      Assessment & Plan:   Problem List Items Addressed This Visit       Cardiovascular and Mediastinum   Essential hypertension - Primary     Follow up plan: No follow-ups on file.

## 2021-12-16 ENCOUNTER — Encounter: Payer: Self-pay | Admitting: Nurse Practitioner

## 2021-12-16 ENCOUNTER — Ambulatory Visit (INDEPENDENT_AMBULATORY_CARE_PROVIDER_SITE_OTHER): Payer: Medicaid Other | Admitting: Nurse Practitioner

## 2021-12-16 VITALS — BP 156/72 | HR 66 | Temp 98.3°F

## 2021-12-16 DIAGNOSIS — I10 Essential (primary) hypertension: Secondary | ICD-10-CM | POA: Diagnosis not present

## 2021-12-16 DIAGNOSIS — Z23 Encounter for immunization: Secondary | ICD-10-CM

## 2021-12-16 NOTE — Assessment & Plan Note (Signed)
Chronic.  Recently added Spirnolactone to current regimen.  Blood pressures are now running 135-145/80s at home.  This is an improvement from 170-180/90s.  BMP checked at visit today due to recently adding new medication.  Patient has only been on medication for about 10 days.  Continue with current regimen.  Has an appt with Cardiology on September 8.  Will follow up in 2 months for reevaluation.

## 2021-12-17 ENCOUNTER — Encounter (INDEPENDENT_AMBULATORY_CARE_PROVIDER_SITE_OTHER): Payer: Medicaid Other

## 2021-12-17 ENCOUNTER — Telehealth: Payer: Self-pay

## 2021-12-17 LAB — BASIC METABOLIC PANEL
BUN/Creatinine Ratio: 23 (ref 9–23)
BUN: 20 mg/dL (ref 6–24)
CO2: 23 mmol/L (ref 20–29)
Calcium: 8.7 mg/dL (ref 8.7–10.2)
Chloride: 104 mmol/L (ref 96–106)
Creatinine, Ser: 0.87 mg/dL (ref 0.57–1.00)
Glucose: 104 mg/dL — ABNORMAL HIGH (ref 70–99)
Potassium: 4.2 mmol/L (ref 3.5–5.2)
Sodium: 142 mmol/L (ref 134–144)
eGFR: 79 mL/min/{1.73_m2} (ref >59)

## 2021-12-17 NOTE — Telephone Encounter (Signed)
Pt returned our call. Shared provider's note.  No questions at this time.   [name], NP  12/17/2021  8:01 AM EDT     Please let patient know that her lab work looks great.  No concerns at this time.  Follow up as discussed.

## 2021-12-17 NOTE — Progress Notes (Signed)
Please let patient know that her lab work looks great.  No concerns at this time.  Follow up as discussed.

## 2021-12-19 ENCOUNTER — Telehealth (INDEPENDENT_AMBULATORY_CARE_PROVIDER_SITE_OTHER): Payer: Self-pay

## 2021-12-19 NOTE — Telephone Encounter (Signed)
Patient left a message stating that she had removed her unna wraps last night because the wraps felt to tight. Patient wraps were placed on 12/15/21. Patient should have an upcoming appointment next week. Patient will continue wit using lymphedema pump and elevating.. Patient will keep appointments as schedule

## 2021-12-22 ENCOUNTER — Encounter (INDEPENDENT_AMBULATORY_CARE_PROVIDER_SITE_OTHER): Payer: Self-pay | Admitting: Nurse Practitioner

## 2021-12-24 ENCOUNTER — Encounter (INDEPENDENT_AMBULATORY_CARE_PROVIDER_SITE_OTHER): Payer: Medicaid Other

## 2021-12-26 ENCOUNTER — Other Ambulatory Visit: Payer: Self-pay | Admitting: Nurse Practitioner

## 2021-12-26 DIAGNOSIS — R0602 Shortness of breath: Secondary | ICD-10-CM

## 2021-12-26 DIAGNOSIS — R059 Cough, unspecified: Secondary | ICD-10-CM

## 2021-12-26 DIAGNOSIS — Z419 Encounter for procedure for purposes other than remedying health state, unspecified: Secondary | ICD-10-CM | POA: Diagnosis not present

## 2021-12-29 ENCOUNTER — Encounter (INDEPENDENT_AMBULATORY_CARE_PROVIDER_SITE_OTHER): Payer: Self-pay | Admitting: Nurse Practitioner

## 2021-12-29 NOTE — Progress Notes (Signed)
Subjective:    Patient ID: Cheryl Chandler, female    DOB: 1965/09/23, 56 y.o.   MRN: [id number] Chief Complaint  Patient presents with   Follow-up    Ultrasound follow up    The patient returns to the office for followup evaluation regarding leg swelling.  The swelling has persisted and the pain associated with swelling continues. There have not been any interval development of a ulcerations or wounds.  Since the previous visit the patient has been wearing graduated compression stockings and has noted little if any improvement in the lymphedema. The patient has not been using compression routinely morning until night.  She has a lymphedema pump but has not been using it consistently.  The patient also states elevation during the day and exercise is being done too.    Today noninvasive studies show that there are normal TBI's bilaterally.  ABIs were unable to be obtained as the patient was in pain from the squeezing of the pressure cuff.  Triphasic tibial artery waveforms bilaterally with normal toe waveforms bilaterally.    Review of Systems  Cardiovascular:  Positive for leg swelling.  All other systems reviewed and are negative.      Objective:   Physical Exam Vitals reviewed.  Constitutional:      Appearance: She is obese.  HENT:     Head: Normocephalic.  Cardiovascular:     Rate and Rhythm: Normal rate.  Pulmonary:     Effort: Pulmonary effort is normal.  Musculoskeletal:     Right lower leg: Edema present.     Left lower leg: Edema present.  Skin:    General: Skin is warm and dry.  Neurological:     Mental Status: She is alert and oriented to person, place, and time.  Psychiatric:        Mood and Affect: Mood normal.        Behavior: Behavior normal.        Thought Content: Thought content normal.        Judgment: Judgment normal.     BP 126/60 (BP Location: Left Arm)   Pulse 64   Resp 16   LMP 06/09/2017 (Approximate)   Past Medical History:   Diagnosis Date   Arthritis    Asthma    COPD (chronic obstructive pulmonary disease) (HCC)    Hypertension    Neuropathy    Pericarditis    Personal history of urinary calculi 11/29/2014   Thyroid disease     Social History   Socioeconomic History   Marital status: Married    Spouse name: Not on file   Number of children: Not on file   Years of education: Not on file   Highest education level: Not on file  Occupational History   Not on file  Tobacco Use   Smoking status: Every Day    Packs/day: 0.50    Years: 30.00    Total pack years: 15.00    Types: Cigarettes   Smokeless tobacco: Never  Vaping Use   Vaping Use: Never used  Substance and Sexual Activity   Alcohol use: No    Alcohol/week: 0.0 standard drinks of alcohol   Drug use: No   Sexual activity: Not on file  Other Topics Concern   Not on file  Social History Narrative   Not on file   Social Determinants of Health   Financial Resource Strain: High Risk (11/03/2021)   Overall Financial Resource Strain (CARDIA)    Difficulty of Paying  Living Expenses: Very hard  Food Insecurity: Food Insecurity Present (12/05/2021)   Hunger Vital Sign    Worried About Running Out of Food in the Last Year: Often true    Ran Out of Food in the Last Year: Often true  Transportation Needs: Unmet Transportation Needs (08/26/2021)   PRAPARE - Hydrologist (Medical): Yes    Lack of Transportation (Non-Medical): Yes  Physical Activity: Inactive (07/21/2021)   Exercise Vital Sign    Days of Exercise per Week: 0 days    Minutes of Exercise per Session: 0 min  Stress: Stress Concern Present (12/11/2021)   [place]    Feeling of Stress : Very much  Social Connections: Moderately Integrated (12/05/2021)   Social Connection and Isolation Panel [NHANES]    Frequency of Communication with Friends and Family: More than three times a week     Frequency of Social Gatherings with Friends and Family: More than three times a week    Attends Religious Services: More than 4 times per year    Active Member of [employer] or Organizations: No    Attends Archivist Meetings: Never    Marital Status: Married  Human resources officer Violence: Not At Risk (10/02/2021)   Humiliation, Afraid, Rape, and Kick questionnaire    Fear of Current or Ex-Partner: No    Emotionally Abused: No    Physically Abused: No    Sexually Abused: No    Past Surgical History:  Procedure Laterality Date   APPENDECTOMY     arm surgery Left    pins   fatty tumor removed from back     JOINT REPLACEMENT Right    LEG SURGERY Bilateral    OVARIAN CYST SURGERY Right    took piece of right ovary due to cyst   TOTAL HIP ARTHROPLASTY Right    wisdom teeth removed      Family History  Problem Relation Age of Onset   Seizures Sister        disorders    Allergies  Allergen Reactions   Codeine Shortness Of Breath and Rash   Percocet [Oxycodone-Acetaminophen] Shortness Of Breath   Sulfa Antibiotics Shortness Of Breath and Rash   Aspirin Hives   Bee Venom     Bee stings   Darvon [Propoxyphene]     Darvocet-N 100   Latex    Oxycodone Nausea And Vomiting   Penicillins     Has patient had a PCN reaction causing immediate rash, facial/tongue/throat swelling, SOB or lightheadedness with hypotension: Unknown Has patient had a PCN reaction causing severe rash involving mucus membranes or skin necrosis: Unknown Has patient had a PCN reaction that required hospitalization: Unknown Has patient had a PCN reaction occurring within the last 10 years: Unknown If all of the above answers are "NO", then may proceed with Cephalosporin use.       Latest Ref Rng & Units 07/28/2021    6:32 PM 06/04/2021    2:32 PM 02/05/2021   11:48 AM  CBC  WBC 4.0 - 10.5 K/uL 10.8  8.5  8.1   Hemoglobin 12.0 - 15.0 g/dL 13.6  13.1  12.9   Hematocrit 36.0 - 46.0 % 43.3  38.5  38.0    Platelets 150 - 400 K/uL 378  355  356       CMP     Component Value Date/Time   NA 142 12/16/2021 1148   NA 135 08/05/2014  1306   K 4.2 12/16/2021 1148   K 4.5 08/05/2014 1306   CL 104 12/16/2021 1148   CL 102 08/05/2014 1306   CO2 23 12/16/2021 1148   CO2 25 08/05/2014 1306   GLUCOSE 104 (H) 12/16/2021 1148   GLUCOSE 104 (H) 07/28/2021 1832   GLUCOSE 136 (H) 08/05/2014 1306   BUN 20 12/16/2021 1148   BUN 23 (H) 08/05/2014 1306   CREATININE 0.87 12/16/2021 1148   CREATININE 0.86 08/05/2014 1306   CALCIUM 8.7 12/16/2021 1148   CALCIUM 8.2 (L) 08/05/2014 1306   PROT 6.6 09/18/2021 1451   PROT 7.8 08/05/2014 1306   ALBUMIN 3.6 (L) 09/18/2021 1451   ALBUMIN 3.9 08/05/2014 1306   AST 15 09/18/2021 1451   AST 30 08/05/2014 1306   ALT 19 09/18/2021 1451   ALT 31 08/05/2014 1306   ALKPHOS 96 09/18/2021 1451   ALKPHOS 60 08/05/2014 1306   BILITOT 0.3 09/18/2021 1451   BILITOT 0.9 08/05/2014 1306   GFRNONAA >60 07/28/2021 1832   GFRNONAA >60 08/05/2014 1306   GFRAA 92 04/03/2020 1637   GFRAA >60 08/05/2014 1306     VAS [place] ABI WITH/WO TBI  Result Date: 12/11/2021  LOWER EXTREMITY DOPPLER STUDY Patient Name:  Darianne Belisa Eichholz  Date of Exam:   12/10/2021 Medical Rec #: [id number]              Accession #:    [id number] Date of Birth: [birth year]               Patient Gender: F Patient Age:   75 years Exam Location:  [facility] Procedure:      VAS [place] ABI WITH/WO TBI Referring Phys: Hortencia Pilar --------------------------------------------------------------------------------  Indications: Rest pain.  Performing Technologist: Almira Coaster RVS  Examination Guidelines: A complete evaluation includes at minimum, Doppler waveform signals and systolic blood pressure reading at the level of bilateral brachial, anterior tibial, and posterior tibial arteries, when vessel segments are accessible. Bilateral testing is considered an integral part of a complete examination.  Photoelectric Plethysmograph (PPG) waveforms and toe systolic pressure readings are included as required and additional duplex testing as needed. Limited examinations for reoccurring indications may be performed as noted.  ABI Findings: +---------+------------------+-----+---------+---------------------------------+ Right    Rt Pressure (mmHg)IndexWaveform Comment                           +---------+------------------+-----+---------+---------------------------------+ Brachial 167                                                               +---------+------------------+-----+---------+---------------------------------+ ATA                             triphasicNot obtained,Patient could not                                             tolerate pressure cuff            +---------+------------------+-----+---------+---------------------------------+ PTA  triphasic                                  +---------+------------------+-----+---------+---------------------------------+ Great Toe158               0.95 Normal                                     +---------+------------------+-----+---------+---------------------------------+ +---------+------------------+-----+---------+---------------------------------+ Left     Lt Pressure (mmHg)IndexWaveform Comment                           +---------+------------------+-----+---------+---------------------------------+ Brachial 160                                                               +---------+------------------+-----+---------+---------------------------------+ ATA                             triphasicNot Obtained, Patient could not                                            tolerate pressure Cuff            +---------+------------------+-----+---------+---------------------------------+ PTA                             triphasic                                   +---------+------------------+-----+---------+---------------------------------+ [place]son Eva               1.13 Normal                                     +---------+------------------+-----+---------+---------------------------------+ +-------+------------+-----------+------------+------------+ ABI/TBIToday's ABI Today's TBIPrevious ABIPrevious TBI +-------+------------+-----------+------------+------------+ Right  Not Obtained.95                                 +-------+------------+-----------+------------+------------+ Left   Not Obtained1.13                                +-------+------------+-----------+------------+------------+  Summary: Right: The right toe-brachial index is normal. ABIs not obtained due to Patient's inability to tolerate Pressure cuff. Left: The left toe-brachial index is normal. ABIs not obtained due to Patient's inability to tolerate Pressure cuff. *See table(s) above for measurements and observations.   Electronically signed by Hortencia Pilar MD on 12/11/2021 at 4:40:37 PM.    Final        Assessment & Plan:   1. Lymphedema The patient continues to have issues with lower extremity edema.  She has issues with the use of medical grade compression socks we will place her into a boot for several weeks in order to help gain control  of her swelling and then revisit the idea of Unna boots.  Patient is also advised to utilize her lymphedema pump.  2. Foot pain, right ABIs are normal.  Cause of foot pain is not related to vascular issues.  Based on description I suspect it may be related to plantar fasciitis.  Patient is advised to follow-up with PCP for further work-up as far as cause of foot pain.  3. Essential hypertension Continue antihypertensive medications as already ordered, these medications have been reviewed and there are no changes at this time.    Current Outpatient Medications on File Prior to Visit  Medication Sig Dispense Refill    ACCU-CHEK GUIDE test strip USE TO CHECK BLOOD SUGAR 3 TIMES DAILY AS DIRECTED 300 each 2   Accu-Chek Softclix Lancets lancets CHECK BLOOD SUGAR TWICE DAILY 100 each 1   albuterol (PROVENTIL) (2.5 MG/3ML) 0.083% nebulizer solution USE 1 VIAL VIA NEBULIZER EVERY 4 HOURS AS NEEDED 225 mL 0   amLODipine (NORVASC) 5 MG tablet TAKE 1 TABLET BY MOUTH ONCE DAILY 90 tablet 1   atorvastatin (LIPITOR) 80 MG tablet TAKE 1 TABLET BY MOUTH ONCE EVERY EVENING 90 tablet 3   Blood Glucose Monitoring Suppl (ACCU-CHEK GUIDE) w/Device KIT Use to check blood sugar 3 times a day. 1 kit 1   Blood Pressure Monitoring (OMRON 3 SERIES BP MONITOR) DEVI Use to check blood pressure as directed 1 each 0   citalopram (CELEXA) 40 MG tablet TAKE 1 TABLET BY MOUTH ONCE DAILY 90 tablet 1   EPINEPHrine 0.3 mg/0.3 mL IJ SOAJ injection INJECT 1 SYRINGE INTO OUTER THIGH ONCE AS NEEDED FOR SEVERE ALLERGIC REACTION. 2 each 3   furosemide (LASIX) 20 MG tablet TAKE 1 TABLET BY MOUTH ONCE DAILY IN AFTERNOON AS NEEDED LEG EDEMA 90 tablet 1   gabapentin (NEURONTIN) 300 MG capsule TAKE 1 CAPSULE BY MOUTH 3 TIMES DAILY ASNEEDED (NEED OFFICE VISIT FOR FURTHER REFILLS) 270 capsule 0   hydrALAZINE (APRESOLINE) 25 MG tablet Take 1 tablet (25 mg total) by mouth 3 (three) times daily. 270 tablet 1   levothyroxine (SYNTHROID) 50 MCG tablet TAKE 1 TABLET BY MOUTH ONCE DAILY ON AN EMPTY STOMACH. WAIT 30 MINUTES BEFORE TAKING OTHER MEDS. 90 tablet 1   lisinopril (ZESTRIL) 40 MG tablet TAKE 1 TABLET BY MOUTH ONCE DAILY 90 tablet 0   meloxicam (MOBIC) 15 MG tablet TAKE 1 TABLET BY MOUTH ONCE DAILY AS NEEDED WITH FOOD OR MILK 90 tablet 3   methocarbamol (ROBAXIN) 500 MG tablet Take 500 mg by mouth every 8 (eight) hours as needed for muscle spasms.     mometasone-formoterol (DULERA) 100-5 MCG/ACT AERO INHALE 2 PUFFS BY MOUTH TWICE A DAY. RINSE MOUTH AFTER USE. 13 g 5   montelukast (SINGULAIR) 10 MG tablet TAKE 1 TABLET BY MOUTH AT BEDTIME 90 tablet 1    nitroGLYCERIN (NITROSTAT) 0.4 MG SL tablet DISSOLVE 1 TABLET UNDER TONGUE AT ONSET OF CHEST PAIN. REPEAT IN 5 MIN IF NOT RESOLVED, MAX 3 DOSES. 911 IF NEEDED. 25 tablet 1   omeprazole (PRILOSEC) 20 MG capsule TAKE 1 CAPSULE BY MOUTH ONCE DAILY 30 capsule 12   SPIRIVA HANDIHALER 18 MCG inhalation capsule INHALE CONTENTS OF 1 CAPSULE ONCE DAILY 90 capsule 2   spironolactone (ALDACTONE) 25 MG tablet Take 0.5 tablets (12.5 mg total) by mouth daily. 45 tablet 1   triamcinolone cream (KENALOG) 0.1 % APPLY 1 APPLICATION TOPICALLY TWICE DAILY 60 g 0   triamcinolone cream (KENALOG) 0.1 % Apply 1  application. topically 2 (two) times daily. 30 g 0   TRULICITY 1.02 VG/8.6OY SOPN INJECT 0.75MG SUBQ ONCE A WEEK 6 mL 1   Vitamin D, Ergocalciferol, (DRISDOL) 1.25 MG (50000 UNIT) CAPS capsule TAKE 1 CAPSULE BY MOUTH EVERY 7 DAYS 12 capsule 1   No current facility-administered medications on file prior to visit.    There are no Patient Instructions on file for this visit. No follow-ups on file.   Kris Hartmann, NP

## 2021-12-30 ENCOUNTER — Encounter (INDEPENDENT_AMBULATORY_CARE_PROVIDER_SITE_OTHER): Payer: Medicaid Other

## 2021-12-30 NOTE — Telephone Encounter (Signed)
Requested Prescriptions  Pending Prescriptions Disp Refills  . albuterol (PROVENTIL) (2.5 MG/3ML) 0.083% nebulizer solution [Pharmacy Med Name: ALBUTEROL SULFATE (2.5 MG/3ML) 0.08] 225 mL 0    Sig: USE 1 VIAL VIA NEBULIZER EVERY 4 HOURS AS NEEDED     Pulmonology:  Beta Agonists 2 Failed - 12/26/2021 12:40 PM      Failed - Last BP in normal range    BP Readings from Last 1 Encounters:  12/16/21 (!) 156/72         Passed - Last Heart Rate in normal range    Pulse Readings from Last 1 Encounters:  12/16/21 66         Passed - Valid encounter within last 12 months    Recent Outpatient Visits          2 weeks ago Essential hypertension   [facility] [name], NP   2 months ago Essential hypertension   [facility] [name], NP   3 months ago Essential hypertension   [facility] [name], NP   6 months ago Essential hypertension   [facility] [name], NP   6 months ago Annual physical exam   [facility] [name], NP      Future Appointments            Tomorrow [name], NP [facility]   In 3 days Debbe Odea, MD [facility] [place]. [facility]   In 1 month [name], NP [facility]           . gabapentin (NEURONTIN) 300 MG capsule [Pharmacy Med Name: GABAPENTIN 300 MG CAP] 270 capsule 0    Sig: TAKE 1 CAPSULE BY MOUTH 3 TIMES DAILY ASNEEDED (NEED OFFICE VISIT FOR FURTHER REFILLS)     Neurology: Anticonvulsants - gabapentin Passed - 12/26/2021 12:40 PM      Passed - Cr in normal range and within 360 days    Creatinine  Date Value Ref Range Status  08/05/2014 0.86 mg/dL Final    Comment:    7.09-6.28 NOTE: New Reference Range  07/03/14    Creatinine, Ser  Date Value Ref Range Status  12/16/2021 0.87 0.57 - 1.00 mg/dL Final         Passed - Completed PHQ-2 or PHQ-9 in  the last 360 days      Passed - Valid encounter within last 12 months    Recent Outpatient Visits          2 weeks ago Essential hypertension   [facility] [name], NP   2 months ago Essential hypertension   [facility] [name], NP   3 months ago Essential hypertension   [facility] [name], NP   6 months ago Essential hypertension   [facility] [name], NP   6 months ago Annual physical exam   [facility] [name], NP      Future Appointments            Tomorrow [name], NP [facility]   In 3 days Debbe Odea, MD [facility] . [facility]   In 1 month [name], NP [facility]           . lisinopril (ZESTRIL) 40 MG tablet [Pharmacy Med Name: LISINOPRIL 40 MG TAB] 90 tablet 0  Sig: TAKE 1 TABLET BY MOUTH ONCE DAILY     Cardiovascular:  ACE Inhibitors Failed - 12/26/2021 12:40 PM      Failed - Last BP in normal range    BP Readings from Last 1 Encounters:  12/16/21 (!) 156/72         Passed - Cr in normal range and within 180 days    Creatinine  Date Value Ref Range Status  08/05/2014 0.86 mg/dL Final    Comment:    0.44-1.00 NOTE: New Reference Range  07/03/14    Creatinine, Ser  Date Value Ref Range Status  12/16/2021 0.87 0.57 - 1.00 mg/dL Final         Passed - K in normal range and within 180 days    Potassium  Date Value Ref Range Status  12/16/2021 4.2 3.5 - 5.2 mmol/L Final  08/05/2014 4.5 mmol/L Final    Comment:    3.5-5.1 NOTE: New Reference Range  07/03/14          Passed - Patient is not pregnant      Passed - Valid encounter within last 6 months    Recent Outpatient Visits          2 weeks ago Essential hypertension   [facility] [name], NP   2 months ago Essential hypertension   [facility] [name], NP   3 months ago Essential hypertension   [facility] [name], NP   6 months ago Essential hypertension   [facility] [name], NP   6 months ago Annual physical exam   [facility] [name], NP      Future Appointments            Tomorrow [name], NP [facility], [place]   In 3 days Kate Sable, MD [place] [place]   In 1 month [name], NP [facility]           . hydrALAZINE (APRESOLINE) 25 MG tablet [Pharmacy Med Name: HYDRALAZINE HCL 25 MG TAB] 270 tablet 1    Sig: TAKE 1 TABLET BY MOUTH 3 TIMES DAILY     Cardiovascular:  Vasodilators Failed - 12/26/2021 12:40 PM      Failed - WBC in normal range and within 360 days    WBC  Date Value Ref Range Status  07/28/2021 10.8 (H) 4.0 - 10.5 K/uL Final         Failed - ANA Screen, Ifa, Serum in normal range and within 360 days    No results found for: "ANA", "ANATITER", "LABANTI"       Failed - Last BP in normal range    BP Readings from Last 1 Encounters:  12/16/21 (!) 156/72         Passed - HCT in normal range and within 360 days    HCT  Date Value Ref Range Status  07/28/2021 43.3 36.0 - 46.0 % Final   Hematocrit  Date Value Ref Range Status  06/04/2021 38.5 34.0 - 46.6 % Final         Passed - HGB in normal range and within 360 days    Hemoglobin  Date Value Ref Range Status  07/28/2021 13.6 12.0 - 15.0 g/dL Final  06/04/2021 13.1 11.1 - 15.9 g/dL Final         Passed - RBC in normal range and within 360 days  RBC  Date Value Ref Range Status  07/28/2021 4.79 3.87 - 5.11 MIL/uL Final         Passed - PLT in normal range and within 360 days    Platelets  Date Value Ref Range Status  07/28/2021 378 150 - 400 K/uL Final  06/04/2021 355 150 - 450 x10E3/uL Final         Passed - Valid encounter within last 12  months    Recent Outpatient Visits          2 weeks ago Essential hypertension   [facility] [name], NP   2 months ago Essential hypertension   [facility] [name], NP   3 months ago Essential hypertension   [facility] [name], NP   6 months ago Essential hypertension   [facility] [name], NP   6 months ago Annual physical exam   [facility] [name], NP      Future Appointments            Tomorrow [name], NP [facility]   In 3 days Debbe Odea, MD [facility] [place]   In 1 month [name], NP [facility]           . nitroGLYCERIN (NITROSTAT) 0.4 MG SL tablet [Pharmacy Med Name: NITROGLYCERIN 0.4 MG SL TAB] 25 tablet 1    Sig: DISSOLVE 1 TABLET UNDER TONGUE AT ONSET OF CHEST PAIN. REPEAT IN 5 MIN IF NOT RESOLVED, MAX 3 DOSES. 911 IF NEEDED.     Cardiovascular:  Nitrates Failed - 12/26/2021 12:40 PM      Failed - Last BP in normal range    BP Readings from Last 1 Encounters:  12/16/21 (!) 156/72         Passed - Last Heart Rate in normal range    Pulse Readings from Last 1 Encounters:  12/16/21 66         Passed - Valid encounter within last 12 months    Recent Outpatient Visits          2 weeks ago Essential hypertension   [facility] [name], NP   2 months ago Essential hypertension   [facility] [name], NP   3 months ago Essential hypertension   [facility] [name], NP   6 months ago Essential hypertension   [facility] [name], NP   6 months ago Annual physical exam   [facility] [name], NP      Future Appointments            Tomorrow [name], NP [facility]   In 3 days Debbe Odea, MD  [facility] [place]   In 1 month [name], NP [facility]

## 2021-12-31 ENCOUNTER — Ambulatory Visit (INDEPENDENT_AMBULATORY_CARE_PROVIDER_SITE_OTHER): Payer: Medicaid Other | Admitting: Nurse Practitioner

## 2021-12-31 ENCOUNTER — Encounter (INDEPENDENT_AMBULATORY_CARE_PROVIDER_SITE_OTHER): Payer: Medicaid Other

## 2021-12-31 DIAGNOSIS — Z538 Procedure and treatment not carried out for other reasons: Secondary | ICD-10-CM

## 2022-01-01 ENCOUNTER — Ambulatory Visit: Payer: Medicaid Other

## 2022-01-01 ENCOUNTER — Other Ambulatory Visit: Payer: Medicaid Other | Admitting: Pharmacist

## 2022-01-01 NOTE — Progress Notes (Signed)
Patient cancelled appt.

## 2022-01-01 NOTE — Progress Notes (Signed)
Chief Complaint  Patient presents with   Medication Management   Hypertension    Cheryl Chandler is a 56 y.o. year old female who presented for a telephone visit.   They were referred to the pharmacist by their High Risk Managed Medicaid Care Team  for assistance in managing complex medication management.   Patient is participating in a Managed Medicaid Plan:  Yes  Subjective:  Care Team: Primary Care Provider: [name], NP ; Next Scheduled Visit: 02/18/2022 Cardiologist: Garen Lah; Next Scheduled Visit: tomorrow  Medication Access/Adherence  Current Pharmacy:  [facility] [place] [address] Phone: [phone] Fax: [phone]   Patient reports affordability concerns with their medications: No  Patient reports access/transportation concerns to their pharmacy: Yes  Patient reports adherence concerns with their medications:  No    Reports nausea, diarrhea lately. Reports one of the kids she is around had a stomach bug that she is getting over. She is feeling better, and is hoping she will be able to make it to the cardiology appointment tomorrow. She notes that they need to put their car in the shop, but her sister plans on taking her to the appointment tomorrow if she feels up to it.   Patient reports financial strain lately.   Hypertension:  Current medications: amlodipine 5 mg daily, hydralazine 25 mg three times daily - taking twice daily, lisinopril 40 mg daily, spironolactone 12.5 mg daily  Patient has a validated, automated, upper arm home BP cuff Current blood pressure readings readings: 120-130s sometimes, when stressed SBP readings more in 140-150s   Health Maintenance  Health Maintenance Due  Topic Date Due   PAP SMEAR-Modifier  Never done   MAMMOGRAM  Never done   COVID-19 Vaccine (4 - Moderna risk series) 03/18/2020   INFLUENZA VACCINE  11/25/2021     Objective: Lab Results   Component Value Date   HGBA1C 5.8 (H) 09/18/2021    Lab Results  Component Value Date   CREATININE 0.87 12/16/2021   BUN 20 12/16/2021   NA 142 12/16/2021   K 4.2 12/16/2021   CL 104 12/16/2021   CO2 23 12/16/2021    Lab Results  Component Value Date   CHOL 130 09/18/2021   HDL 33 (L) 09/18/2021   LDLCALC 78 09/18/2021   TRIG 99 09/18/2021   CHOLHDL 3.9 09/18/2021    Medications Reviewed Today     Reviewed by Kris Hartmann, NP (Nurse Practitioner) on 12/29/21 at [place] List Status: <None>   Medication Order Taking? Sig Documenting Provider Last Dose Status Informant  ACCU-CHEK GUIDE test strip [id number] Yes USE TO CHECK BLOOD SUGAR 3 TIMES DAILY AS DIRECTED [name], NP Taking Active   Accu-Chek Softclix Lancets lancets [id number] Yes CHECK BLOOD SUGAR TWICE DAILY [name], NP Taking Active   albuterol (PROVENTIL) (2.5 MG/3ML) 0.083% nebulizer solution [id number] Yes USE 1 VIAL VIA NEBULIZER EVERY 4 HOURS AS NEEDED [name], NP Taking Active   amLODipine (NORVASC) 5 MG tablet [id number] Yes TAKE 1 TABLET BY MOUTH ONCE DAILY [name], NP Taking Active   atorvastatin (LIPITOR) 80 MG tablet [id number] Yes TAKE 1 TABLET BY MOUTH ONCE EVERY [name], NP Taking Active   Blood Glucose Monitoring Suppl (ACCU-CHEK GUIDE) w/Device KIT [id number] Yes Use to check blood sugar 3 times a day. [name], NP Taking Active   Blood Pressure Monitoring (OMRON 3 SERIES BP MONITOR) DEVI [id number]  Yes Use to check blood pressure as directed [name], NP Taking Active   citalopram (CELEXA) 40 MG tablet [id number] Yes TAKE 1 TABLET BY MOUTH ONCE DAILY [name], NP Taking Active   EPINEPHrine 0.3 mg/0.3 mL IJ SOAJ injection [id number] Yes INJECT 1 SYRINGE INTO OUTER THIGH ONCE AS NEEDED FOR SEVERE ALLERGIC REACTION. [name], NP Taking Active   furosemide (LASIX) 20 MG tablet [id number] Yes TAKE 1 TABLET BY MOUTH ONCE  DAILY IN AFTERNOON AS NEEDED LEG EDEMA [name], NP Taking Active   gabapentin (NEURONTIN) 300 MG capsule [id number] Yes TAKE 1 CAPSULE BY MOUTH 3 TIMES DAILY ASNEEDED (NEED OFFICE VISIT FOR FURTHER REFILLS) [name], NP Taking Active   hydrALAZINE (APRESOLINE) 25 MG tablet [id number] Yes Take 1 tablet (25 mg total) by mouth 3 (three) times daily. [name], NP Taking Active            Med Note Jodi Mourning, Grace Bushy   Fri Dec 05, 2021  3:07 PM) Twice daily  levothyroxine (SYNTHROID) 50 MCG tablet [id number] Yes TAKE 1 TABLET BY MOUTH ONCE DAILY ON AN EMPTY STOMACH. WAIT 30 MINUTES BEFORE TAKING OTHER MEDS. [name], NP Taking Active   lisinopril (ZESTRIL) 40 MG tablet [id number] Yes TAKE 1 TABLET BY MOUTH ONCE DAILY [name], NP Taking Active   meloxicam (MOBIC) 15 MG tablet [id number] Yes TAKE 1 TABLET BY MOUTH ONCE DAILY AS NEEDED WITH FOOD OR MILK [name], NP Taking Active   methocarbamol (ROBAXIN) 500 MG tablet [id number] Yes Take 500 mg by mouth every 8 (eight) hours as needed for muscle spasms. [provider] Taking Active   mometasone-formoterol [facility]) 100-5 MCG/ACT AERO [id number] Yes INHALE 2 PUFFS BY MOUTH TWICE A DAY. RINSE MOUTH AFTER USE. [name], NP Taking Active   montelukast (SINGULAIR) 10 MG tablet [id number] Yes TAKE 1 TABLET BY MOUTH AT BEDTIME [name], NP Taking Active   nitroGLYCERIN (NITROSTAT) 0.4 MG SL tablet [id number] Yes DISSOLVE 1 TABLET UNDER TONGUE AT ONSET OF CHEST PAIN. REPEAT IN 5 MIN IF NOT RESOLVED, MAX 3 DOSES. 911 IF NEEDED. [name], NP Taking Active   omeprazole (PRILOSEC) 20 MG capsule [id number] Yes TAKE 1 CAPSULE BY MOUTH ONCE DAILY [name], NP Taking Active   SPIRIVA HANDIHALER 18 MCG inhalation capsule [id number] Yes INHALE CONTENTS OF 1 CAPSULE ONCE DAILY [name], NP Taking Active   spironolactone (ALDACTONE) 25 MG tablet [id number] Yes Take 0.5 tablets  (12.5 mg total) by mouth daily. [name], NP Taking Active   triamcinolone cream (KENALOG) 0.1 % [id number] Yes APPLY 1 APPLICATION TOPICALLY TWICE DAILY [name], [name], NP Taking Active   triamcinolone cream (KENALOG) 0.1 % [id number] Yes Apply 1 application. topically 2 (two) times daily. [name], NP Taking Active   TRULICITY 8.76 OT/1.5BW [name] [id number] Yes INJECT 0.75MG SUBQ ONCE A WEEK [name], NP Taking Active   Vitamin D, Ergocalciferol, (DRISDOL) 1.25 MG (50000 UNIT) CAPS capsule [id number] Yes TAKE 1 CAPSULE BY MOUTH EVERY 7 DAYS [name], NP Taking Active            [name] Today    Flowsheet Row Most Recent Value  [name]   Transportation Interventions Patient Resources (Friends/Family)  Financial Strain Interventions Other (Comment)  [MM care team]  Stress Interventions Provide Counseling       Assessment/Plan:   Hypertension: - Currently uncontrolled but improved - Reviewed appropriate blood pressure monitoring technique and reviewed goal blood pressure. Recommended to check home blood  pressure and heart rate daily - Recommend to continue current regimen. Reminded of upcoming appointment tomorrow with cardiology and encouraged to keep the appointment as able.     Follow Up Plan: follow up in 4 weeks  Catie Hedwig Morton, PharmD, [facility] [phone]

## 2022-01-02 ENCOUNTER — Ambulatory Visit: Payer: Medicaid Other | Admitting: Cardiology

## 2022-01-05 ENCOUNTER — Encounter (INDEPENDENT_AMBULATORY_CARE_PROVIDER_SITE_OTHER): Payer: Medicaid Other

## 2022-01-06 ENCOUNTER — Institutional Professional Consult (permissible substitution): Payer: Medicaid Other | Admitting: Pulmonary Disease

## 2022-01-07 ENCOUNTER — Encounter (INDEPENDENT_AMBULATORY_CARE_PROVIDER_SITE_OTHER): Payer: Medicaid Other

## 2022-01-08 DIAGNOSIS — R3981 Functional urinary incontinence: Secondary | ICD-10-CM | POA: Diagnosis not present

## 2022-01-08 DIAGNOSIS — Z993 Dependence on wheelchair: Secondary | ICD-10-CM | POA: Diagnosis not present

## 2022-01-12 ENCOUNTER — Encounter (INDEPENDENT_AMBULATORY_CARE_PROVIDER_SITE_OTHER): Payer: Medicaid Other

## 2022-01-14 ENCOUNTER — Other Ambulatory Visit: Payer: Self-pay | Admitting: Obstetrics and Gynecology

## 2022-01-14 NOTE — Patient Instructions (Signed)
Visit Information  [name]  - as a part of your Medicaid benefit, you are eligible for care management and care coordination services at no cost or copay. I was unable to reach you by phone today but would be happy to help you with your health related needs. Please feel free to call me at [phone].  A member of the Managed Medicaid care management team will reach out to you again over the next 30 business  days.   Aida Raider RN, BSN [place]  [facility] - Managed Medicaid High Risk [phone].

## 2022-01-14 NOTE — Patient Outreach (Signed)
Care Coordination  01/14/2022  Cheryl Chandler [birth year] [id number]   Medicaid Managed Care   Unsuccessful Outreach Note  01/14/2022 Name: Cheryl Chandler MRN: [id number] DOB: June 25, 1965  Referred by: [name], NP Reason for referral : High Risk Managed Medicaid (Unsuccessful telephone outreach)   An unsuccessful telephone outreach was attempted today. The patient was referred to the case management team for assistance with care management and care coordination.   Follow Up Plan: The care management team will reach out to the patient again over the next 30 business  days.   Aida Raider RN, BSN [place]  [facility] - Managed Medicaid High Risk [phone]

## 2022-01-15 ENCOUNTER — Other Ambulatory Visit: Payer: Self-pay

## 2022-01-15 ENCOUNTER — Ambulatory Visit (INDEPENDENT_AMBULATORY_CARE_PROVIDER_SITE_OTHER): Payer: Medicaid Other | Admitting: Vascular Surgery

## 2022-01-15 NOTE — Patient Outreach (Addendum)
Medicaid Managed Care Social Work Note  01/15/2022 Name:  Cheryl Chandler MRN:  [id number] DOB:  23-Nov-1965  Cheryl Chandler is an 56 y.o. year old female who is a primary patient of Cheryl Billings, NP.  The [facility] Managed Care Coordination team was consulted for assistance with:   community resources  [name] was given information about Medicaid Managed Care Coordination team services today. [name] agreed to services and verbal consent obtained.  Engaged with patient  for by telephone forfollow up visit in response to referral for case management and/or care coordination services.   Assessments/Interventions:  Review of past medical history, allergies, medications, health status, including review of consultants reports, laboratory and other test data, was performed as part of comprehensive evaluation and provision of chronic care management services.  SDOH: (Social Determinant of Health) assessments and interventions performed: [name]    Flowsheet Row Patient Outreach from 01/01/2022 in [place] Patient Outreach Telephone from 12/11/2021 in [place] Patient Outreach Telephone from 12/05/2021 in [place] Patient Outreach Telephone from 11/17/2021 in [place] Patient Outreach Telephone from 11/03/2021 in [place] Office Visit from 10/22/2021 in [place] Interventions        Food Insecurity Interventions -- -- Other (Comment)  [has resources] -- -- --  Transportation Interventions Patient Resources (Friends/Family) -- -- -- -- --  Depression Interventions/Treatment  -- -- -- -- -- Medication, Currently on Treatment  Financial Strain Interventions Other (Comment)  [MM care team] -- -- -- Other (Comment)  [patient has set up  payment plans for medical bills] --  Stress Interventions Provide Counseling [employer], Provide Counseling -- [facility] Well, [place] completed a telephone outreach with patient. She stated she has not been feeling well and the landlord is going up to $450 per month on the lot rent. Patient states husband gets about 1300 per month and she gets 6 or 7 dollars per month. Patient states they continue to make it, but it has been a struggle.   Advanced Directives Status:  Not addressed in this encounter.  Care Plan                 Allergies  Allergen Reactions   Codeine Shortness Of Breath and Rash   Percocet [Oxycodone-Acetaminophen] Shortness Of Breath   Sulfa Antibiotics Shortness Of Breath and Rash   Aspirin Hives   Bee Venom     Bee stings   Darvon [Propoxyphene]     Darvocet-N 100   Latex    Oxycodone Nausea And Vomiting   Penicillins     Has patient had a PCN reaction causing immediate rash, facial/tongue/throat swelling, SOB or lightheadedness with hypotension: Unknown Has patient had a PCN reaction causing severe rash involving mucus membranes or skin necrosis: Unknown Has patient had a PCN reaction that required hospitalization: Unknown Has patient had a PCN reaction occurring within the last 10 years: Unknown If all of the above answers are "NO", then may proceed with Cephalosporin use.    Medications Reviewed Today     Reviewed by Osker Mason, RPH-CPP (Pharmacist) on 01/01/22 at 1000  Med List Status: <None>   Medication Order Taking? Sig Documenting Provider Last Dose Status Informant  ACCU-CHEK GUIDE test strip [id number]  USE TO CHECK BLOOD SUGAR 3  TIMES DAILY AS DIRECTED Cheryl Billings, NP  Active   Accu-Chek Softclix Lancets lancets [id number]  CHECK BLOOD SUGAR TWICE DAILY Cheryl Billings, NP  Active   albuterol (PROVENTIL) (2.5 MG/3ML) 0.083% nebulizer solution [id number]  USE 1 VIAL VIA  NEBULIZER EVERY 4 HOURS AS NEEDED Cheryl Billings, NP  Active   amLODipine (NORVASC) 5 MG tablet [id number] Yes TAKE 1 TABLET BY MOUTH ONCE DAILY Cheryl Billings, NP Taking Active   atorvastatin (LIPITOR) 80 MG tablet [id number] Yes TAKE 1 TABLET BY MOUTH ONCE EVERY Cheryl Bradley, NP Taking Active   Blood Glucose Monitoring Suppl (ACCU-CHEK GUIDE) w/Device KIT [id number]  Use to check blood sugar 3 times a day. Cheryl Chandler T, NP  Active   Blood Pressure Monitoring (OMRON 3 SERIES BP MONITOR) DEVI [id number] Yes Use to check blood pressure as directed Cheryl Billings, NP Taking Active   citalopram (CELEXA) 40 MG tablet [id number]  TAKE 1 TABLET BY MOUTH ONCE DAILY Cheryl Billings, NP  Active   EPINEPHrine 0.3 mg/0.3 mL IJ SOAJ injection [id number]  INJECT 1 SYRINGE INTO OUTER THIGH ONCE AS NEEDED FOR SEVERE ALLERGIC REACTION. Cheryl Billings, NP  Active   furosemide (LASIX) 20 MG tablet [id number] Yes TAKE 1 TABLET BY MOUTH ONCE DAILY IN AFTERNOON AS NEEDED LEG EDEMA Cheryl Billings, NP Taking Active   gabapentin (NEURONTIN) 300 MG capsule [id number] Yes TAKE 1 CAPSULE BY MOUTH 3 TIMES DAILY ASNEEDED (NEED OFFICE VISIT FOR FURTHER REFILLS) Cheryl Billings, NP Taking Active   hydrALAZINE (APRESOLINE) 25 MG tablet [id number] Yes TAKE 1 TABLET BY MOUTH 3 TIMES DAILY Cheryl Billings, NP Taking Active   levothyroxine (SYNTHROID) 50 MCG tablet [id number] Yes TAKE 1 TABLET BY MOUTH ONCE DAILY ON AN EMPTY STOMACH. WAIT 30 MINUTES BEFORE TAKING OTHER MEDS. Cheryl Billings, NP Taking Active   lisinopril (ZESTRIL) 40 MG tablet [id number] Yes TAKE 1 TABLET BY MOUTH ONCE DAILY Cheryl Billings, NP Taking Active   meloxicam (MOBIC) 15 MG tablet [id number]  TAKE 1 TABLET BY MOUTH ONCE DAILY AS NEEDED WITH FOOD OR MILK Cheryl Billings, NP  Active   methocarbamol (ROBAXIN) 500 MG tablet [id number]  Take 500 mg by mouth every 8 (eight) hours as needed for muscle spasms. [provider]   Active   mometasone-formoterol [facility]) 100-5 MCG/ACT AERO [id number] Yes INHALE 2 PUFFS BY MOUTH TWICE A DAY. RINSE MOUTH AFTER USE. Cheryl Billings, NP Taking Active   montelukast (SINGULAIR) 10 MG tablet [id number] Yes TAKE 1 TABLET BY MOUTH AT BEDTIME Cheryl Billings, NP Taking Active   nitroGLYCERIN (NITROSTAT) 0.4 MG SL tablet [id number]  DISSOLVE 1 TABLET UNDER TONGUE AT ONSET OF CHEST PAIN. REPEAT IN 5 MIN IF NOT RESOLVED, MAX 3 DOSES. 911 IF NEEDED. Cheryl Billings, NP  Active   omeprazole (PRILOSEC) 20 MG capsule [id number] Yes TAKE 1 CAPSULE BY MOUTH ONCE DAILY Cheryl Billings, NP Taking Active   SPIRIVA HANDIHALER 18 MCG inhalation capsule [id number] Yes INHALE CONTENTS OF 1 CAPSULE ONCE DAILY Cheryl Billings, NP Taking Active   spironolactone (ALDACTONE) 25 MG tablet [id number] Yes Take 0.5 tablets (12.5 mg total) by mouth daily. Cheryl Billings, NP Taking Active   triamcinolone cream (KENALOG) 0.1 % [id number]  APPLY 1 APPLICATION TOPICALLY TWICE DAILY [name], [name], NP  Active   triamcinolone cream (KENALOG) 0.1 % [id number]  Apply 1 application. topically 2 (two) times daily. Cheryl Billings, NP  Active   TRULICITY 7.11 AF/7.9UX [name] [id number]  INJECT 0.75MG SUBQ ONCE A WEEK Cheryl Billings, NP  Active  Vitamin D, Ergocalciferol, (DRISDOL) 1.25 MG (50000 UNIT) CAPS capsule [id number]  TAKE 1 CAPSULE BY MOUTH EVERY 7 DAYS Cheryl Billings, NP  Active             Patient Active Problem List   Diagnosis Date Noted   Foot pain, right 09/01/2021   Fibrocystic disease of both breasts 05/07/2019   IFG (impaired fasting glucose) 05/03/2019   Chronic venous insufficiency 11/09/2018   Overactive bladder 09/30/2018   Peripheral neuropathy 09/30/2018   Apnea 01/02/2016   Lymphedema 11/30/2014   Dyspnea on exertion 11/30/2014   Anxiety 11/29/2014   Arthritis 11/29/2014   COPD (chronic obstructive pulmonary disease) ([place]) 11/29/2014   Clinical depression 11/29/2014    H/O eating disorder 11/29/2014   Esophageal reflux 11/29/2014   Acne inversa 11/29/2014   Essential hypertension 11/29/2014   HLD (hyperlipidemia) 11/29/2014   Adult hypothyroidism 11/29/2014   Insomnia 11/29/2014   Low back pain 11/29/2014   Morbid obesity ([place]) 11/29/2014   Posttraumatic stress disorder 11/29/2014   Vitamin D deficiency 11/29/2014   Nicotine dependence, cigarettes, uncomplicated 60/07/5995    Conditions to be addressed/monitored per PCP order:   community resources  There are no care plans that you recently modified to display for this patient.   Follow up:  Patient agrees to Care Plan and Follow-up.  Plan: The Managed Medicaid care management team will reach out to the patient again over the next 30-60 days.  Date/time of next scheduled Social Work care management/care coordination outreach:  03/04/22  Mickel Fuchs, Arita Miss, [place] Medicaid Team  [phone]

## 2022-01-15 NOTE — Patient Instructions (Signed)
Visit Information  Cheryl Chandler was given information about Medicaid Managed Care team care coordination services as a part of their [facility] Medicaid benefit. Cheryl Chandler verbally consented to engagement with the [facility] Managed Care team.   If you are experiencing a medical emergency, please call 911 or report to your local emergency department or urgent care.   If you have a non-emergency medical problem during routine business hours, please contact your provider's office and ask to speak with a nurse.   For questions related to your [facility] health plan, please call: [phone] or go here:[email or web address]  If you would like to schedule transportation through your [facility] plan, please call the following number at least 2 days in advance of your appointment: [phone].  You can also use the MTM portal or MTM mobile app to manage your rides. For the portal, please go to [email or web address].  Call the [place] at [phone], at any time, 24 hours a day, 7 days a week. If you are in danger or need immediate medical attention call 911.  If you would like help to quit smoking, call 1-800-QUIT-NOW 930-370-5939) OR Espaol: 1-855-Djelo-Ya (5-885-027-7412) o para ms informacin haga clic aqu or Text READY to 200-400 to register via text  [name] - following are the goals we discussed in your visit today:   Goals Addressed   None    Social Worker will follow up in 30-60 days .   Mickel Fuchs, BSW, [place] Managed Medicaid Team  [phone]   Following is a copy of your plan of care:  There are no care plans that you recently modified to display for this patient.

## 2022-01-19 ENCOUNTER — Ambulatory Visit (INDEPENDENT_AMBULATORY_CARE_PROVIDER_SITE_OTHER): Payer: Medicaid Other | Admitting: Vascular Surgery

## 2022-01-19 ENCOUNTER — Other Ambulatory Visit: Payer: Self-pay | Admitting: Licensed Clinical Social Worker

## 2022-01-19 NOTE — Patient Outreach (Addendum)
Medicaid Managed Care Social Work Note  01/19/2022 Name:  Cheryl Chandler MRN:  [id number] DOB:  02/01/1966  Cheryl Chandler is an 56 y.o. year old female who is a primary patient of Chandler, Karen, NP.  The Medicaid Managed Care Coordination team was consulted for assistance with:  Mental Health Counseling and Resources  [name] was given information about Medicaid Managed Care Coordination team services today. Cheryl Chandler Patient agreed to services and verbal consent obtained.  Engaged with patient  for by telephone forfollow up visit in response to referral for case management and/or care coordination services.   Assessments/Interventions:  Review of past medical history, allergies, medications, health status, including review of consultants reports, laboratory and other test data, was performed as part of comprehensive evaluation and provision of chronic care management services.  SDOH: (Social Determinant of Health) assessments and interventions performed: [name]    Flowsheet Row Patient Outreach Telephone from 01/19/2022 in [facility] Community Care Coordination Patient Outreach from 01/01/2022 in [facility] Patient Outreach Telephone from 12/11/2021 in [facility] Community Care Coordination Patient Outreach Telephone from 12/05/2021 in [facility] Community Care Coordination Patient Outreach Telephone from 11/17/2021 in [facility] Community Care Coordination Patient Outreach Telephone from 11/03/2021 in [facility] Community Care Coordination  [name]        Food Insecurity Interventions -- -- -- Other (Comment)  [has resources] -- --  Transportation Interventions -- Patient Resources (Friends/Family) -- -- -- --  Financial Strain Interventions Intervention Not Indicated  [[facility] BSW] Other (Comment)  [MM care team] -- -- -- Other (Comment)  [patient  has set up payment plans for medical bills]  Stress Interventions Offered Community Wellness Resources, Provide Counseling Provide Counseling Offered Community Wellness Resources, Provide Counseling -- Live Life Well, Offered Community Wellness Resources --       Advanced Directives Status:  See Care Plan for related entries.  Care Plan                 Allergies  Allergen Reactions   Codeine Shortness Of Breath and Rash   Percocet [Oxycodone-Acetaminophen] Shortness Of Breath   Sulfa Antibiotics Shortness Of Breath and Rash   Aspirin Hives   Bee Venom     Bee stings   Darvon [Propoxyphene]     Darvocet-N 100   Latex    Oxycodone Nausea And Vomiting   Penicillins     Has patient had a PCN reaction causing immediate rash, facial/tongue/throat swelling, SOB or lightheadedness with hypotension: Unknown Has patient had a PCN reaction causing severe rash involving mucus membranes or skin necrosis: Unknown Has patient had a PCN reaction that required hospitalization: Unknown Has patient had a PCN reaction occurring within the last 10 years: Unknown If all of the above answers are "NO", then may proceed with Cephalosporin use.    Medications Reviewed Today     Reviewed by ,  L, LCSW (Social Worker) on 01/19/22 at 1258  Med List Status: <None>   Medication Order Taking? Sig Documenting Provider Last Dose Status Informant  ACCU-CHEK GUIDE test strip [id number] No USE TO CHECK BLOOD SUGAR 3 TIMES DAILY AS DIRECTED Chandler, Karen, NP Taking Active   Accu-Chek Softclix Lancets lancets [id number] No CHECK BLOOD SUGAR TWICE DAILY Chandler, Karen, NP Taking Active   albuterol (PROVENTIL) (2.5 MG/3ML) 0.083% nebulizer solution [id number]  USE 1 VIAL VIA NEBULIZER EVERY 4 HOURS AS NEEDED Chandler, Karen, NP  Active     amLODipine (NORVASC) 5 MG tablet [id number] No TAKE 1 TABLET BY MOUTH ONCE DAILY Chandler, Karen, NP Taking Active   atorvastatin (LIPITOR) 80 MG tablet [id number] No  TAKE 1 TABLET BY MOUTH ONCE EVERY EVENING Chandler, Karen, NP Taking Active   Blood Glucose Monitoring Suppl (ACCU-CHEK GUIDE) w/Device KIT [id number] No Use to check blood sugar 3 times a day. [name], [name], NP Taking Active   Blood Pressure Monitoring (OMRON 3 SERIES BP MONITOR) DEVI [id number] No Use to check blood pressure as directed Chandler, Karen, NP Taking Active   citalopram (CELEXA) 40 MG tablet [id number] No TAKE 1 TABLET BY MOUTH ONCE DAILY Chandler, Karen, NP Taking Active   EPINEPHrine 0.3 mg/0.3 mL IJ SOAJ injection [id number] No INJECT 1 SYRINGE INTO OUTER THIGH ONCE AS NEEDED FOR SEVERE ALLERGIC REACTION. Chandler, Karen, NP Taking Active   furosemide (LASIX) 20 MG tablet [id number] No TAKE 1 TABLET BY MOUTH ONCE DAILY IN AFTERNOON AS NEEDED LEG EDEMA Chandler, Karen, NP Taking Active   gabapentin (NEURONTIN) 300 MG capsule [id number] No TAKE 1 CAPSULE BY MOUTH 3 TIMES DAILY ASNEEDED (NEED OFFICE VISIT FOR FURTHER REFILLS) Chandler, Karen, NP Taking Active   hydrALAZINE (APRESOLINE) 25 MG tablet [id number] No TAKE 1 TABLET BY MOUTH 3 TIMES DAILY Chandler, Karen, NP Taking Active   levothyroxine (SYNTHROID) 50 MCG tablet [id number] No TAKE 1 TABLET BY MOUTH ONCE DAILY ON AN EMPTY STOMACH. WAIT 30 MINUTES BEFORE TAKING OTHER MEDS. Chandler, Karen, NP Taking Active   lisinopril (ZESTRIL) 40 MG tablet [id number] No TAKE 1 TABLET BY MOUTH ONCE DAILY Chandler, Karen, NP Taking Active   meloxicam (MOBIC) 15 MG tablet [id number] No TAKE 1 TABLET BY MOUTH ONCE DAILY AS NEEDED WITH FOOD OR MILK Chandler, Karen, NP Taking Active   methocarbamol (ROBAXIN) 500 MG tablet [id number] No Take 500 mg by mouth every 8 (eight) hours as needed for muscle spasms. [provider] Taking Active   mometasone-formoterol (DULERA) 100-5 MCG/ACT AERO [id number] No INHALE 2 PUFFS BY MOUTH TWICE A DAY. RINSE MOUTH AFTER USE. Chandler, Karen, NP Taking Active   montelukast (SINGULAIR) 10 MG  tablet [id number] No TAKE 1 TABLET BY MOUTH AT BEDTIME Chandler, Karen, NP Taking Active   nitroGLYCERIN (NITROSTAT) 0.4 MG SL tablet [id number]  DISSOLVE 1 TABLET UNDER TONGUE AT ONSET OF CHEST PAIN. REPEAT IN 5 MIN IF NOT RESOLVED, MAX 3 DOSES. 911 IF NEEDED. Chandler, Karen, NP  Active   omeprazole (PRILOSEC) 20 MG capsule [id number] No TAKE 1 CAPSULE BY MOUTH ONCE DAILY Chandler, Karen, NP Taking Active   SPIRIVA HANDIHALER 18 MCG inhalation capsule [id number] No INHALE CONTENTS OF 1 CAPSULE ONCE DAILY Chandler, Karen, NP Taking Active   spironolactone (ALDACTONE) 25 MG tablet [id number] No Take 0.5 tablets (12.5 mg total) by mouth daily. Chandler, Karen, NP Taking Active   triamcinolone cream (KENALOG) 0.1 % [id number] No APPLY 1 APPLICATION TOPICALLY TWICE DAILY [name], [name], NP Taking Active   triamcinolone cream (KENALOG) 0.1 % [id number] No Apply 1 application. topically 2 (two) times daily. Chandler, Karen, NP Taking Active   TRULICITY 0.75 MG/0.5ML SOPN [id number] No INJECT 0.75MG SUBQ ONCE A WEEK Chandler, Karen, NP Taking Active   Vitamin D, Ergocalciferol, (DRISDOL) 1.25 MG (50000 UNIT) CAPS capsule [id number] No TAKE 1 CAPSULE BY MOUTH EVERY 7 DAYS Chandler, Karen, NP Taking Active             Patient Active Problem List   Diagnosis Date Noted   Foot pain, right 09/01/2021     Fibrocystic disease of both breasts 05/07/2019   IFG (impaired fasting glucose) 05/03/2019   Chronic venous insufficiency 11/09/2018   Overactive bladder 09/30/2018   Peripheral neuropathy 09/30/2018   Apnea 01/02/2016   Lymphedema 11/30/2014   Dyspnea on exertion 11/30/2014   Anxiety 11/29/2014   Arthritis 11/29/2014   COPD (chronic obstructive pulmonary disease) (HCC) 11/29/2014   Clinical depression 11/29/2014   H/O eating disorder 11/29/2014   Esophageal reflux 11/29/2014   Acne inversa 11/29/2014   Essential hypertension 11/29/2014   HLD (hyperlipidemia) 11/29/2014   Adult  hypothyroidism 11/29/2014   Insomnia 11/29/2014   Low back pain 11/29/2014   Morbid obesity (HCC) 11/29/2014   Posttraumatic stress disorder 11/29/2014   Vitamin D deficiency 11/29/2014   Nicotine dependence, cigarettes, uncomplicated 11/29/2014    Conditions to be addressed/monitored per PCP order:  Depression  Care Plan : General Social Work (Adult)  Updates made by ,  L, LCSW since 01/19/2022 12:00 AM     Problem: Coping Skills (General Plan of Care)      Long-Range Goal: Coping Skills Enhanced   Start Date: 11/07/2020  Expected End Date: 03/07/2022  Recent Progress: On track  Priority: High  Note:   Timeframe:  Long-Range Goal Priority:  High Start Date:       08/26/21                    Expected End Date:  ongoing  Follow up date: 02/03/22 at 1:00 pm  Current Barriers:  Financial constraints Mental Health Concerns  Social Isolation Limited education about mental health support resources that are available to her within the local area* Cognitive Deficits Lacks knowledge of community resource  Clinical Social Work Clinical Goal(s):  Over the next 90 days, client will work with SW to address concerns related to experiencing ongoing symptoms of depression, sadness and grief since the passing of her mother Over the next 90 days, patient will work with LCSW to address needs related to implementing appropriate self-care and depression management.   Interventions: Patient interviewed and appropriate assessments performed Provided mental health counseling with regards to patient's loss and ongoing stressors. Patient was very close with her mother who passed away a few years ago and she continues to experience symptoms of grief from this loss. Patient denies wanting LCSW to make a referral for grief counseling through [facility] again but was receptive to coping skill and resource education/information that was provided during session today. Patient reports decorating  for the holidays helps her to connect with her mother as they did this together when she was alive.  Provided patient with information about managing depression within her daily life. Patient has ongoing grief symptoms as well. Patient states "I feel worn down." Patient reports that she talks out loud to her mother's picture and this is very helpful to her. Grief support resource education provided again during session today.  Provided reflective listening and implemented appropriate interventions to help suppport patient and her emotional needs  Discussed plans with patient for ongoing care management follow up and provided patient with direct contact information for [facility] team Advised patient to contact [facility] team with any urgent case management needs. Assisted patient/caregiver with obtaining information about health plan benefits LCSW completed past referral for family to have a wheelchair ramp built at their residence due to ongoing mobility concerns. Ramp has been successfully installed.  PCS actively involved with patient and providing ongoing services.  Positive reinforcement provided to patient for quitting smoking cigarettes.   Coping skill review education provided for future triggers.  Brief self-care education provided to both patient and spouse. Patient confirms that she continues to go to food pantry for food insecurity. Resource information has been provided.  Patient reports ongoing pain and issues with mobility. Patient shares that her pain resides in her pains and legs. Emotional support and coping skill education provided. Patient confirms that her PCS aide continues to provide her with services. However, aide has to be reminded to put on her mask since patient is high risk. LCSW reviewed resources that [name] provided her with in the past.  Patient reports that she had a wonderful birthday spent with her family this past May. Patient reports receiving appropriate socialization. Patient  reports that her spouse took her out to eat and gifted her with several things which was a nice surprise.  Patient confirms stable transportation arrangements for upcoming PCP appointment this week.  Patient repots that she is losing a lot of weight but is not sure how much she has lost as she does not have a scale. Patient reports that a goal of hers is to lose weight but she has not been intentionally losing weight at this time.  Patient reports that her aide [name] continues to assist her with PCS and is an excellent support within the home for her and spouse.  Patient planted a tomato plant and successfully grew over 5 tomatoes over the summer. Patient is very proud of this achievement as this was her first time planting a tomato plant and is now interested in picking up gardening/planting as a hobby. Patient reports that her aunt continues to be a positive support for her in her life and someone that she can rely on in the case of emergencies.  Patient was educated on healthy self-care skills and advised her to use these into her daily routine.  04/10/21- Update- Patient is agreeable to social work closure at this time as she still does not need medication management or counseling as she is managing her mental health well at this time. Patient talks to her younger sister everyday to gain emotional support. Patient says that her sister is into body building and helps to educate and remind her to do her own self-care. Patient is agreeable to contact [facility] LCSW directly if any social work concern arise. [facility] LCSW will complete case closure at this time.  Patient reports that she has been elevating her legs daily to elevate pain and swelling.  [facility] LCSW completed care coordination with Thomasene Lot on 01/15/21 and [facility] LCSW provided the following suggested resource-It is likely their best option given their loss of housing and lack of consistent income:  Publishing rights manager                                                                                                                       (Emer[employer] [place]) [phone] Ext. 104 M-F, 8am - 4pm Patient was successfully approved for food stamps  but is still experiencing financial concerns. She reports that she and her husband have no clothes that fit them and they are having to start using a string for their pants and patient only has one bra at this time. [facility] LCSW will send referral to [facility] BSW today for financial support.  BSW spoke with patient about resources needed. Patient states she does receive SSI and was approved for foodstamps. Patient is familiar with all of the food pantries and other resources in [place]. BSW will assist patient with finding an eye doctor. Patient states she has not been in 10 years. BSW will research and put a list of eye doctors together that accept Medicaid in [place]. 04/30/20: BSW contacted and spoke with patient, she stated she was not doing good because she found out that she has a new landlord and they are going up on her rent. BSW informed patient to make sure she informs her social workers at DSS that they have an increase for foodstamps and Medicaid. Patient stated that she sleeps on the couch and is in need of a power recliner/lift chair. BSW will send patients PCP a message asking for a script to be sent. 05/21/21: BSW completed follow up telephone call with patient. She states she is unsure if their new landlord has received their January rent. Patient states she is also worried due to the new landlord making them pay rent online and on the 1st of the month. Patient states she is unable to get anyone on the phone. Patient states her rent will go up to 400 from 255 in March. Patient states she does not have many clothes but goes to [employer] to get some clothing materials. No other resources needed at this time.   06/20/21: BSW completed follow up telelphone call with patient.  Patient states she is doing well. Patient states [employer] will cover all of Vaccines. Patient states no resources are needed at this time. 08/26/21: BSW completed telephone outreach with patient, she stated since going to the hospital she has been feeling weak. Patient states other than that she is feeling okay. Patient stated she is going to get a new phone on Wednesday. Patient states her birthday is on Friday and her husband may take her to a mexican restaurant. No other resources are needed at this time.Update- Patient reports experiencing additional grief lately. [facility] LCSW offered referral for berievement counseling but she declined. UPDATE 08/26/21- Patient reports that she has a near death experience last month. Patient shares that she had a fall. 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 [facility] LCSW completed education on available mental health resources within her area to utilize. Patient admits to experiencing some sadness due to grief from her mother's passing. Emotional support provided. 10/13/21 UPDATE- Patient reports that she is working hard on living a healthier life both physically and mentally. Patient reports that she is making active changes in her diet. She reports that she eventually wishes to quit smoking again and has started picking up new hobbies to implement during times of craving such as coloring, crocheting and being creative. Patient reports that she is unsure if she needs mental health support at this time because her main concerns are centered around her and her spouse's physical health instead their mental health. [facility] LCSW educated patient on how they affect one another and their importance. Patient denies any urgent concerns. She reports no issues with affording food due to food stamp assistance. [facility] LCSW will reassess   social work needs 30 days from now. UPDATE 11/17/21- Patient reports that she is experiencing several stressors at this time. She was encouraged to consider mental  health treatment but she is not interested at this time. Patient was encouraged to consider mental health resources. Patient reports that their car is in the shop and that they have no stable transportation besides their sister and her husband's father. Patient reports that her spouse has had several recent health issues that have affected her mental state. Patient was educated on healthy coping skills for stress. Patient denies no urgent concerns or issues at this time. UPDATE- Care Coordination completed with [facility] LCSW, PCP office, Pharmacist, [facility] BSW and PCP. Patient has canceled several medical appointments because they are unable to afford a copay. Patient will benefit from financial assistance and may benefit from a [facility] application. Patient has an upcoming appointment with [facility] BSW next week. Patient is agreeable to exploring financial assistance options with [facility] BSW that will help alleviate other areas of financial stress within their life in order for patient to be able to afford medical expenses. [facility] LCSW encouraged patient to consider individual counseling. Patient is agreeable to referral for counseling but needs to find out her sister's email first as she is unable to go to office to fill out intake paperwork. Patient prefers virtual counseling to save money on transportation. [facility] LCSW will successfully place referral to [facility] during next outreach. [facility] LCSW completed care coordination with [facility] BSW.  BSW 12/15/21: BSW completed a telephone outreach with patient. She stated everything is still going okay. She has been having problems with her leg wraps and has an appointment today. She states her husband is still having issues with his eye and with the doctor and she is going to make a compliant. Patient states no resources are needed at this time. [facility] LCSW 01/19/22: Patient reports that she was unable to sleep last night. She is experiencing a break out skin reaction that she feels is  caused by stress. Her financial stressors have increased since last session. Patient is agreeable to [facility] LCSW placing referral for [facility] now but reports that she has no email address. [facility] LCSW completed referral and made note of her inability to get into her email. [facility] LCSW will follow up within two weeks to ensure that she was enrolled in [facility] counseling program.   Managing Loss, Adult People experience loss in many different ways throughout their lives. Events such as moving, changing jobs, and losing friends can create a sense of loss. The loss may be as serious as a major health change, divorce, death of a pet, or death of a loved one. All of these types of loss are likely to create a physical and emotional reaction known as grief. Grief is the result of a major change or an absence of something or someone that you count on. Grief is a normal reaction to loss. A variety of factors can affect your grieving experience, including: The nature of your loss. Your relationship to what or whom you lost. Your understanding of grief and how to manage it. Your support system. How to manage lifestyle changes Keep to your normal routine as much as possible. If you have trouble focusing or doing normal activities, it is acceptable to take some time away from your normal routine. Spend time with friends and loved ones. Eat a healthy diet, get plenty of sleep, and rest when you feel tired. How to recognize  changes  The way that you deal with your grief will affect your ability to function as you normally do. When grieving, you may experience these changes: Numbness, shock, sadness, anxiety, anger, denial, and guilt. Thoughts about death. Unexpected crying. A physical sensation of emptiness in your stomach. Problems sleeping and eating. Tiredness (fatigue). Loss of interest in normal activities. Dreaming about or imagining seeing the person who died. A need to remember what or whom  you lost. Difficulty thinking about anything other than your loss for a period of time. Relief. If you have been expecting the loss for a while, you may feel a sense of relief when it happens. Follow these instructions at home:    Activity Express your feelings in healthy ways, such as: Talking with others about your loss. It may be helpful to find others who have had a similar loss, such as a support group. Writing down your feelings in a journal. Doing physical activities to release stress and emotional energy. Doing creative activities like painting, sculpting, or playing or listening to music. Practicing resilience. This is the ability to recover and adjust after facing challenges. Reading some resources that encourage resilience may help you to learn ways to practice those behaviors. General instructions Be patient with yourself and others. Allow the grieving process to happen, and remember that grieving takes time. It is likely that you may never feel completely done with some grief. You may find a way to move on while still cherishing memories and feelings about your loss. Accepting your loss is a process. It can take months or longer to adjust. Keep all follow-up visits as told by your health care provider. This is important. Where to find support To get support for managing loss: Ask your health care provider for help and recommendations, such as grief counseling or therapy. Think about joining a support group for people who are managing a loss. Where to find more information You can find more information about managing loss from: American Society of Clinical Oncology: www.cancer.net American Psychological Association: TVStereos.ch Contact a health care provider if: Your grief is extreme and keeps getting worse. You have ongoing grief that does not improve. Your body shows symptoms of grief, such as illness. You feel depressed, anxious, or lonely. Get help right away if: You  have thoughts about hurting yourself or others. If you ever feel like you may hurt yourself or others, or have thoughts about taking your own life, get help right away. You can go to your nearest emergency department or call: Your local emergency services (911 in the [place] A suicide crisis helpline, such as the [place] at 661-399-6534. This is open 24 hours a day. Summary Grief is the result of a major change or an absence of someone or something that you count on. Grief is a normal reaction to loss. The depth of grief and the period of recovery depend on the type of loss and your ability to adjust to the change and process your feelings. Processing grief requires patience and a willingness to accept your feelings and talk about your loss with people who are supportive. It is important to find resources that work for you and to realize that people experience grief differently. There is not one grieving process that works for everyone in the same way. Be aware that when grief becomes extreme, it can lead to more severe issues like isolation, depression, anxiety, or suicidal thoughts. Talk with your health care provider if you have  any of these issues. This information is not intended to replace advice given to you by your health care provider. Make sure you discuss any questions you have with your health care provider. Document Revised: 06/17/2018 Document Reviewed: 08/27/2016 Elsevier Patient Education  2020 Elsevier Inc.  Help for Managing Grief   When you are experiencing grief, many resources and support are available to help you. You are not alone.    Grief Share (https://www.griefshare.org/):    [facility] [address] [place], [place]      [facility]  [address] [place], [place]     [facility] [address] [place], [place]      [facility] [address] [place], [place]  [phone]   [facility] [address] [place], [place] [phone]   [facility] [address] [place], [place]     [phone]   [facility] [address] [place], [place]     [phone]   If a Hospice or Palliative Care team has been involved in the care of your loved one, you may reach out to your contact there or locally, you may reach out to [facility] [place]:    [facility] [place]   [address] [place], [place] [address] [phone] Patient was educated on healthy self-care as she admits she has little motivation to walk lately. PCS recommendation was suggested.   Patient Self Care Activities:  Attends all scheduled provider appointments Calls provider office for new concerns or questions  Please see past updates related to this goal by clicking on the "Past Updates" button in the selected goal        Follow up:  Patient agrees to Care Plan and Follow-up.  Plan: The Managed Medicaid care management team will reach out to the patient again over the next 30 days.  Date/time of next scheduled Social Work care management/care coordination outreach:  02/02/22 at 1pm   , BSW, MSW, LCSW Managed Medicaid LCSW [place]  [facility] [email or web address] Phone: [phone]   

## 2022-01-19 NOTE — Patient Instructions (Addendum)
Visit Information  Cheryl Chandler was given information about Medicaid Managed Care team care coordination services as a part of their [facility] Medicaid benefit. Cheryl Chandler verbally consented to engagement with the [facility] Managed Care team.   If you are experiencing a medical emergency, please call 911 or report to your local emergency department or urgent care.   If you have a non-emergency medical problem during routine business hours, please contact your provider's office and ask to speak with a nurse.   For questions related to your [facility] health plan, please call: [phone] or go here:[email or web address]  If you would like to schedule transportation through your [facility] plan, please call the following number at least 2 days in advance of your appointment: [phone].  You can also use the MTM portal or MTM mobile app to manage your rides. For the portal, please go to [email or web address].  Call the [place] at [phone], at any time, 24 hours a day, 7 days a week. If you are in danger or need immediate medical attention call 911.  If you would like help to quit smoking, call 1-800-QUIT-NOW (1-(706)662-5805) OR Espaol: 1-855-Djelo-Ya (3-710-626-9485) o para ms informacin haga clic aqu or Text READY to 200-400 to register via text   Following is a copy of your plan of care:  Care Plan : General Social Work (Adult)  Updates made by [name], LCSW since 01/19/2022 12:00 AM     Problem: Coping Skills (General Plan of Care)      Long-Range Goal: Coping Skills Enhanced   Start Date: 11/07/2020  Expected End Date: 03/07/2022  Recent Progress: On track  Priority: High  Note:   Timeframe:  Long-Range Goal Priority:  High Start Date:       08/26/21                    Expected End Date:  ongoing  Follow up date: 02/03/22 at 1:00 pm  Current Barriers:  Financial constraints Mental Health Concerns  Social  Isolation Limited education about mental health support resources that are available to her within the local area* Cognitive Deficits Lacks knowledge of community resource  Clinical Social Work Clinical Goal(s):  Over the next 90 days, client will work with SW to address concerns related to experiencing ongoing symptoms of depression, sadness and grief since the passing of her mother Over the next 90 days, patient will work with LCSW to address needs related to implementing appropriate self-care and depression management.   Managing Loss, Adult People experience loss in many different ways throughout their lives. Events such as moving, changing jobs, and losing friends can create a sense of loss. The loss may be as serious as a major health change, divorce, death of a pet, or death of a loved one. All of these types of loss are likely to create a physical and emotional reaction known as grief. Grief is the result of a major change or an absence of something or someone that you count on. Grief is a normal reaction to loss. A variety of factors can affect your grieving experience, including: The nature of your loss. Your relationship to what or whom you lost. Your understanding of grief and how to manage it. Your support system. How to manage lifestyle changes Keep to your normal routine as much as possible. If you have trouble focusing or doing normal activities, it is acceptable to take some time away from your normal routine. Spend time with  friends and loved ones. Eat a healthy diet, get plenty of sleep, and rest when you feel tired. How to recognize changes  The way that you deal with your grief will affect your ability to function as you normally do. When grieving, you may experience these changes: Numbness, shock, sadness, anxiety, anger, denial, and guilt. Thoughts about death. Unexpected crying. A physical sensation of emptiness in your stomach. Problems sleeping and  eating. Tiredness (fatigue). Loss of interest in normal activities. Dreaming about or imagining seeing the person who died. A need to remember what or whom you lost. Difficulty thinking about anything other than your loss for a period of time. Relief. If you have been expecting the loss for a while, you may feel a sense of relief when it happens. Follow these instructions at home:    Activity Express your feelings in healthy ways, such as: Talking with others about your loss. It may be helpful to find others who have had a similar loss, such as a support group. Writing down your feelings in a journal. Doing physical activities to release stress and emotional energy. Doing creative activities like painting, sculpting, or playing or listening to music. Practicing resilience. This is the ability to recover and adjust after facing challenges. Reading some resources that encourage resilience may help you to learn ways to practice those behaviors. General instructions Be patient with yourself and others. Allow the grieving process to happen, and remember that grieving takes time. It is likely that you may never feel completely done with some grief. You may find a way to move on while still cherishing memories and feelings about your loss. Accepting your loss is a process. It can take months or longer to adjust. Keep all follow-up visits as told by your health care provider. This is important. Where to find support To get support for managing loss: Ask your health care provider for help and recommendations, such as grief counseling or therapy. Think about joining a support group for people who are managing a loss. Where to find more information You can find more information about managing loss from: American Society of Clinical Oncology: www.cancer.net American Psychological Association: DiceTournament.ca Contact a health care provider if: Your grief is extreme and keeps getting worse. You have  ongoing grief that does not improve. Your body shows symptoms of grief, such as illness. You feel depressed, anxious, or lonely. Get help right away if: You have thoughts about hurting yourself or others. If you ever feel like you may hurt yourself or others, or have thoughts about taking your own life, get help right away. You can go to your nearest emergency department or call: Your local emergency services (911 in the [place].S[place] A suicide crisis helpline, such as the National Suicide Prevention Lifeline at 639-657-6150. This is open 24 hours a day. Summary Grief is the result of a major change or an absence of someone or something that you count on. Grief is a normal reaction to loss. The depth of grief and the period of recovery depend on the type of loss and your ability to adjust to the change and process your feelings. Processing grief requires patience and a willingness to accept your feelings and talk about your loss with people who are supportive. It is important to find resources that work for you and to realize that people experience grief differently. There is not one grieving process that works for everyone in the same way. Be aware that when grief becomes extreme, it can  lead to more severe issues like isolation, depression, anxiety, or suicidal thoughts. Talk with your health care provider if you have any of these issues. This information is not intended to replace advice given to you by your health care provider. Make sure you discuss any questions you have with your health care provider. Document Revised: 06/17/2018 Document Reviewed: 08/27/2016 Elsevier Patient Education  2020 [employer].  Help for Managing Grief   When you are experiencing grief, many resources and support are available to help you. You are not alone.    Grief Share (https://www.SunglassSpecialist.gl):    [employer] [facility] [address] [place], [place]      [facility]  [phone] S.  Church [address] [place], [place]     [facility] [address] [place], [place]      [facility] [address] [place], [place]  [phone]   [facility] [address] [place], [place] [phone]   [facility] [address] [place], [place]     [phone]   [facility] [address] [place], [place]     [phone]   If a Hospice or Palliative Care team has been involved in the care of your loved one, you may reach out to your contact there or locally, you may reach out to [facility]:    [facility]   [address] [place], [place] [address] [phone] Patient was educated on healthy self-care as she admits she has little motivation to walk lately. PCS recommendation was suggested.   Patient Self Care Activities:  Attends all scheduled provider appointments Calls provider office for new concerns or questions  Please see past updates related to this goal by clicking on the "Past Updates" button in the selected goal

## 2022-01-20 ENCOUNTER — Other Ambulatory Visit: Payer: Self-pay | Admitting: Nurse Practitioner

## 2022-01-20 ENCOUNTER — Ambulatory Visit: Payer: Self-pay

## 2022-01-20 DIAGNOSIS — J301 Allergic rhinitis due to pollen: Secondary | ICD-10-CM

## 2022-01-20 MED ORDER — TRIAMCINOLONE ACETONIDE 0.1 % EX CREA: TOPICAL_CREAM | CUTANEOUS | 0 refills | Status: DC

## 2022-01-20 NOTE — Telephone Encounter (Signed)
Pt states she has red itchy bumps on arms, legs, and shoulder for a while   Pt requesting a rx for cream, pt states provider has treated her for symptoms in the past   Please assist further    Chief Complaint: Hives, itchy rash, Symptoms: Itchy, Asking for something, "a cream, for itching. Frequency: Several weeks  ago Pertinent Negatives: Patient denies visit Disposition: [] ED /[] Urgent Care (no appt availability in office) / [] Appointment(In office/virtual)/ []  [place] [facility]/ [] Home Care/ [] Refused Recommended Disposition /[] [place] Mobile Bus/ [x]  Follow-up with PCP Additional Notes: Please advise pt.  Answer Assessment - Initial Assessment Questions 1. APPEARANCE of RASH: "Describe the rash."      Hives 2. LOCATION: "Where is the rash located?"      Arms, legs 3. NUMBER: "How many spots are there?"      Numerous 4. SIZE: "How big are the spots?" (Inches, centimeters or compare to size of a coin)      Many 5. ONSET: "When did the rash start?"      Several weeeks 6. ITCHING: "Does the rash itch?" If Yes, ask: "How bad is the itch?"  (Scale 0-10; or none, mild, moderate, severe)     Severe 7. PAIN: "Does the rash hurt?" If Yes, ask: "How bad is the pain?"  (Scale 0-10; or none, mild, moderate, severe)    - NONE (0): no pain    - MILD (1-3): doesn't interfere with normal activities     - MODERATE (4-7): interferes with normal activities or awakens from sleep     - SEVERE (8-10): excruciating pain, unable to do any normal activities     pain 8. OTHER SYMPTOMS: "Do you have any other symptoms?" (e.g., fever)     Pain 9. PREGNANCY: "Is there any chance you are pregnant?" "When was your last menstrual period?"     nO  Protocols used: Rash or Redness - Localized-A-AH

## 2022-01-20 NOTE — Telephone Encounter (Signed)
Medication sent to the pharmacy.

## 2022-01-20 NOTE — Telephone Encounter (Signed)
Requested Prescriptions  Pending Prescriptions Disp Refills  . montelukast (SINGULAIR) 10 MG tablet [Pharmacy Med Name: MONTELUKAST SODIUM 10 MG TAB] 90 tablet 3    Sig: TAKE 1 TABLET BY MOUTH AT BEDTIME     Pulmonology:  Leukotriene Inhibitors Passed - 01/20/2022 12:15 PM      Passed - Valid encounter within last 12 months    Recent Outpatient Visits          2 weeks ago Appointment canceled by hospital   [place], NP   1 month ago Essential hypertension   [place], NP   3 months ago Essential hypertension   [facility] [name], NP   4 months ago Essential hypertension   [facility] [name], NP   6 months ago Essential hypertension   [facility] [name], NP      Future Appointments            In 4 weeks [name], NP [facility]   In 1 month Gollan, Kathlene November, MD [place]. [place]

## 2022-01-20 NOTE — Telephone Encounter (Signed)
Attempted to call pt regarding medication being sent in, unable to LVM due to inbox not being set up.

## 2022-01-25 DIAGNOSIS — Z419 Encounter for procedure for purposes other than remedying health state, unspecified: Secondary | ICD-10-CM | POA: Diagnosis not present

## 2022-01-26 ENCOUNTER — Ambulatory Visit: Payer: Self-pay | Admitting: *Deleted

## 2022-01-26 NOTE — Telephone Encounter (Signed)
Reason for Disposition  Localized rash present > 7 days  Answer Assessment - Initial Assessment Questions 1. APPEARANCE of RASH: "Describe the rash."      The rash is itching so bad.  Kenalog cream 2. LOCATION: "Where is the rash located?"      Arms and arms.   My back cleared up.    3. NUMBER: "How many spots are there?"       4. SIZE: "How big are the spots?" (Inches, centimeters or compare to size of a coin)      Red sores that itch     5. ONSET: "When did the rash start?"      3 weeks 6. ITCHING: "Does the rash itch?" If Yes, ask: "How bad is the itch?"  (Scale 0-10; or none, mild, moderate, severe)     itching 7. PAIN: "Does the rash hurt?" If Yes, ask: "How bad is the pain?"  (Scale 0-10; or none, mild, moderate, severe)    - NONE (0): no pain    - MILD (1-3): doesn't interfere with normal activities     - MODERATE (4-7): interferes with normal activities or awakens from sleep     - SEVERE (8-10): excruciating pain, unable to do any normal activities     I used antibiotic lotion to see if that would help and it is.    8. OTHER SYMPTOMS: "Do you have any other symptoms?" (e.g., fever)     The itching is so bad. 9. PREGNANCY: "Is there any chance you are pregnant?" "When was your last menstrual period?"     N/A  Protocols used: Rash or Redness - Localized-A-AH

## 2022-01-26 NOTE — Telephone Encounter (Signed)
Summary: another round   Pt called in to ask if provider would send in another round of antibiotic cream triamcinolone cream (KENALOG) 0.1 %. Pt says that it is helping but feels that another round would clear up the hives that she has. Pt says that she still have some bumps all over her body.    Please assist further      Called patient to review sx and requesting medication refill. No answer, LVMTCB [phone].

## 2022-01-26 NOTE — Telephone Encounter (Signed)
2nd attempt. Patient called, left VM to return the call to the office to discuss symptoms and medication with a nurse.  Summary: another round   Pt called in to ask if provider would send in another round of antibiotic cream triamcinolone cream (KENALOG) 0.1 %. Pt says that it is helping but feels that another round would clear up the hives that she has. Pt says that she still have some bumps all over her body.    Please assist further.

## 2022-01-26 NOTE — Telephone Encounter (Signed)
Medication was just refilled last week.  Too soon to refill.

## 2022-01-26 NOTE — Telephone Encounter (Signed)
  Chief Complaint: Requesting a refill of her Kenalog 0.1% cream.   The rash she has is still itching but this cream is helping so requesting another round. Symptoms: Itchy rash on both arms and legs.   The rash on her back has resolved Frequency: Started 3 weeks ago.   It's better but still itching a lot. Pertinent Negatives: Patient denies knowing what is causing the rash Disposition: [] ED /[] Urgent Care (no appt availability in office) / [] Appointment(In office/virtual)/ []  [place] [facility]/ [] Home Care/ [] Refused Recommended Disposition /[] [place] Mobile Bus/ [x]  Follow-up with PCP Additional Notes: I have sent your request to [name], NP to see if she will approve another round of the Kenalog 0.1% cream.

## 2022-01-27 NOTE — Telephone Encounter (Signed)
LVMTRC 

## 2022-01-29 ENCOUNTER — Telehealth: Payer: Self-pay | Admitting: Licensed Clinical Social Worker

## 2022-01-29 ENCOUNTER — Other Ambulatory Visit: Payer: Self-pay | Admitting: Obstetrics and Gynecology

## 2022-01-29 IMAGING — DX DG FOOT COMPLETE 3+V*R*
3 series · 3 of 3 positions shown · non-contrast
Comparison: None.

CLINICAL DATA: Pain and swelling

EXAM:
RIGHT FOOT COMPLETE - 3+ VIEW

[foot ap]
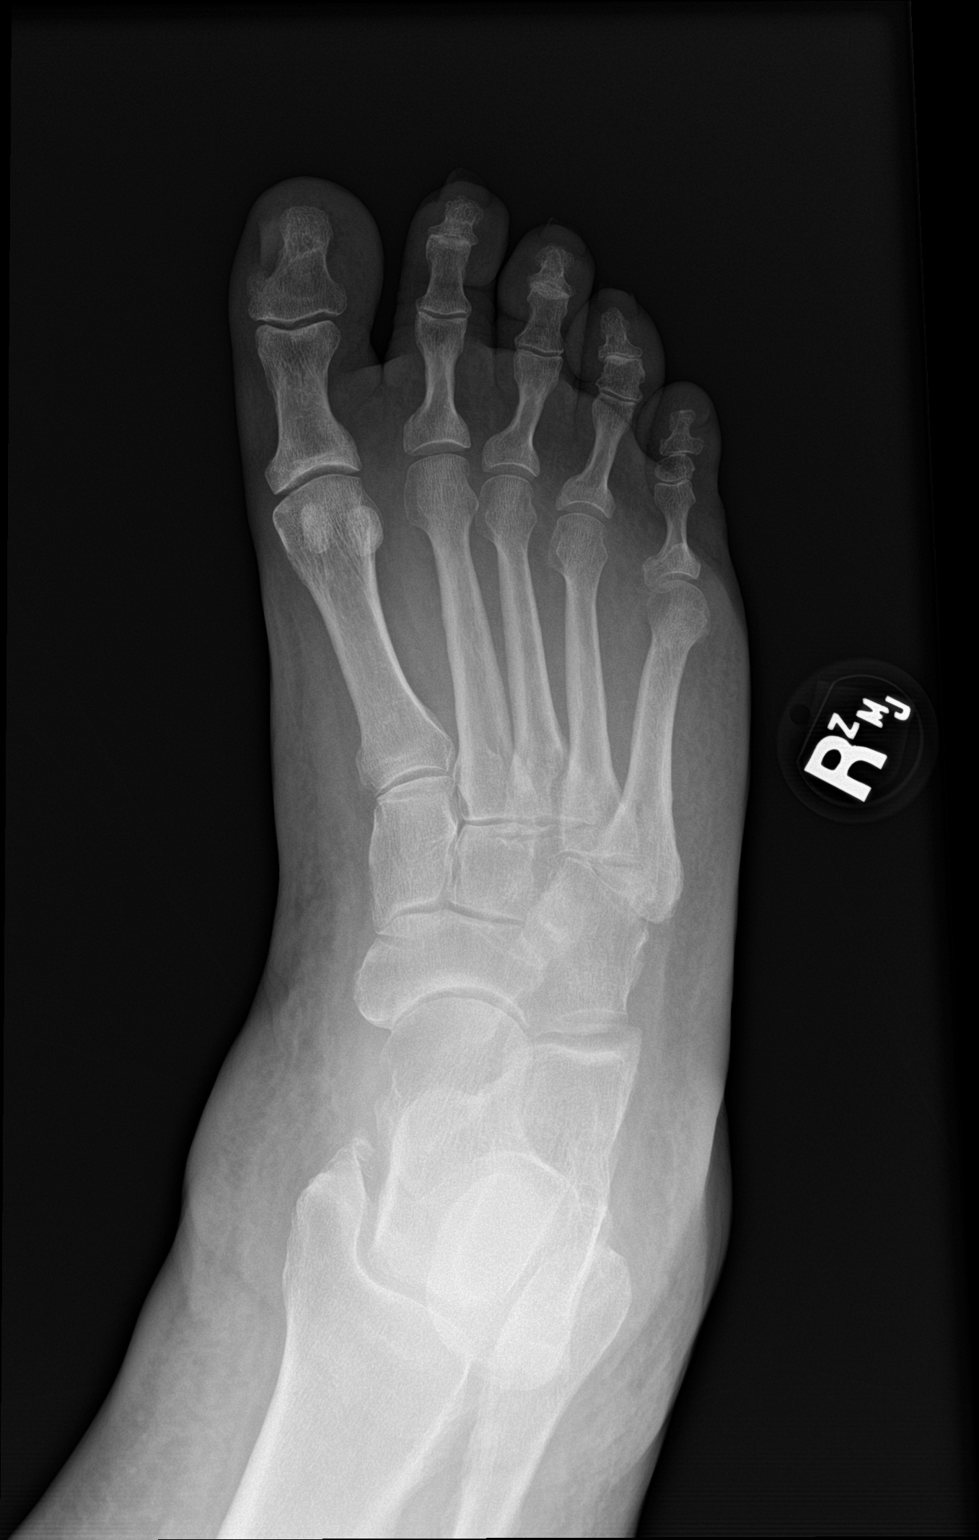

[foot obl]
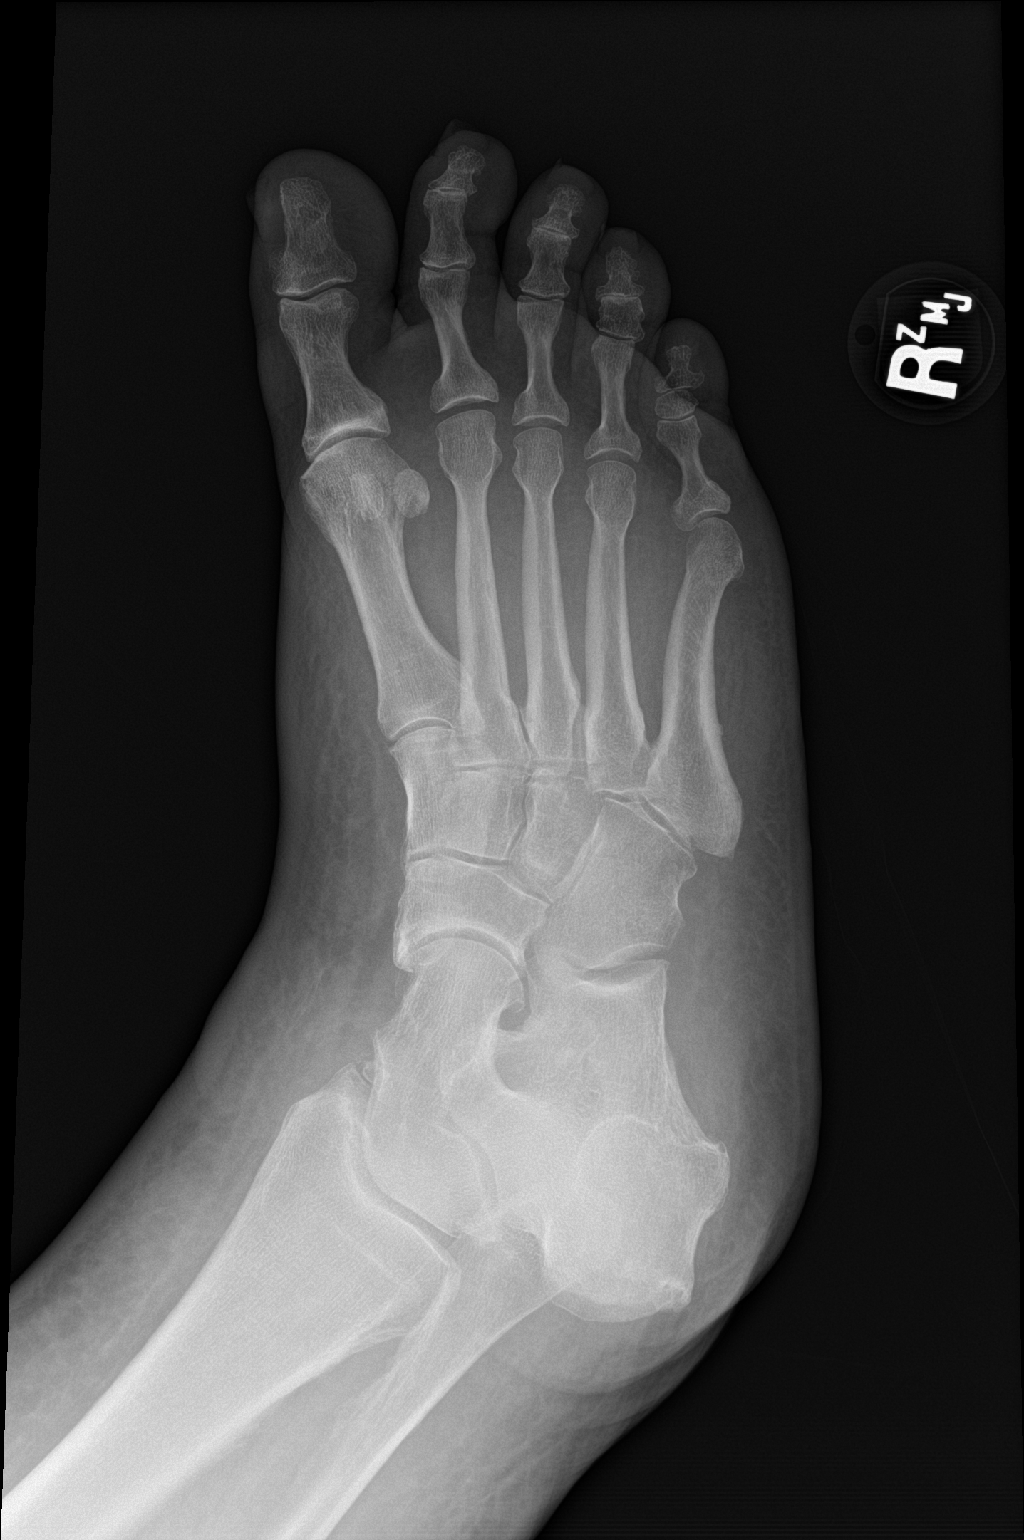

[foot lat]
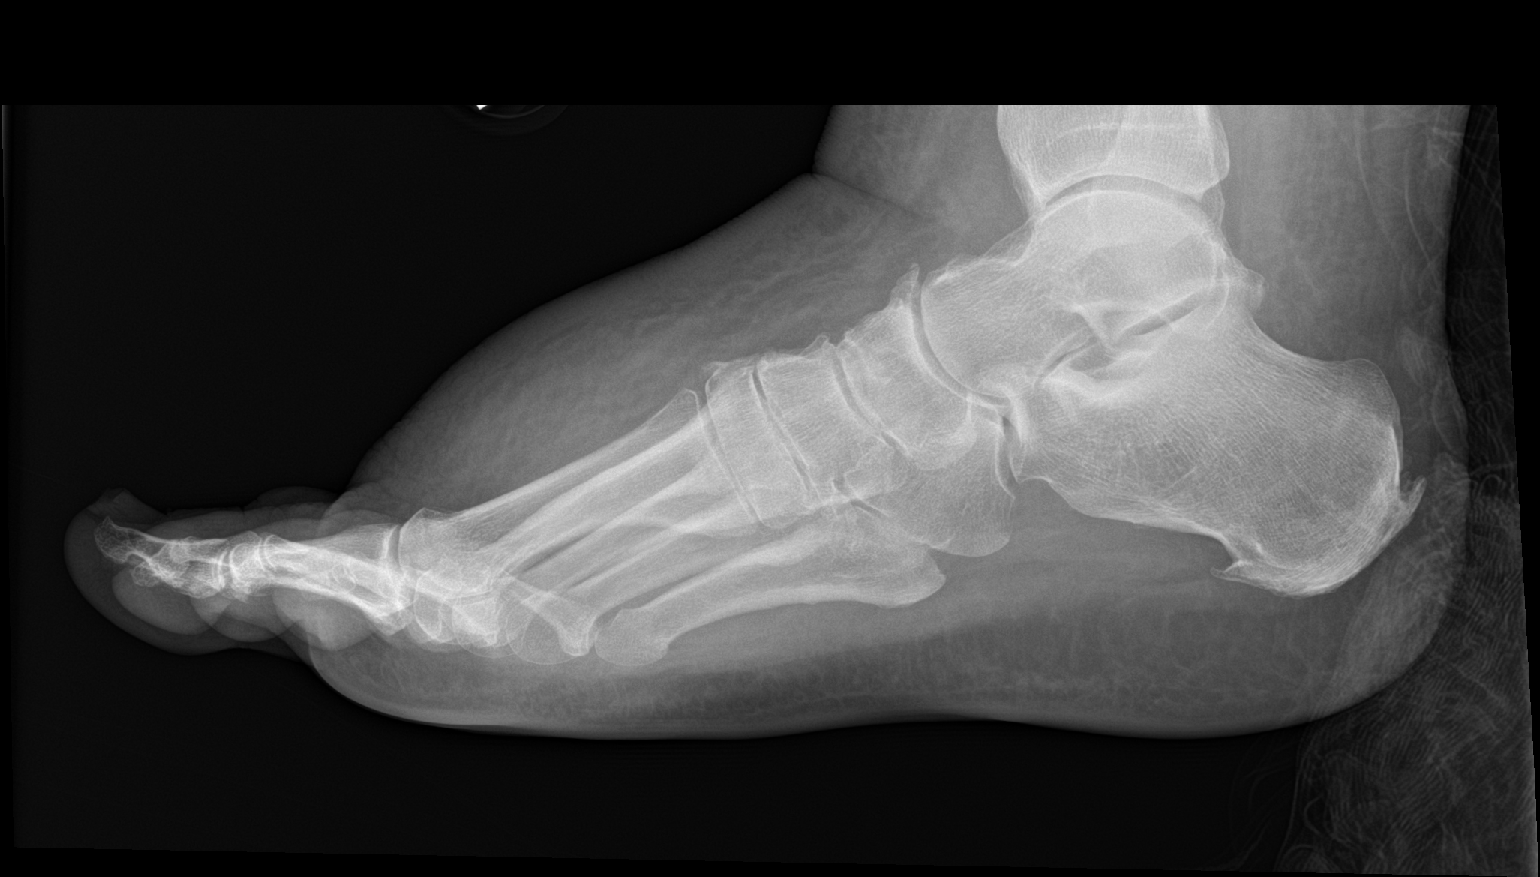

[3 of 3 positions shown; findings below may reference images not displayed]

FINDINGS: Negative for fracture or acute bony abnormality. Mild degenerative
change first MTP. Degenerative spurring in the midfoot and
calcaneus.

Diffuse soft tissue swelling particularly of the forefoot dorsally.
IMPRESSION: Soft tissue swelling.  No acute skeletal abnormality.

## 2022-01-29 NOTE — Patient Outreach (Addendum)
Medicaid Managed Care Coordination Call Social Work Note  01/29/2022 Name:  Cheryl Chandler MRN:  [id number] DOB:  May 11, 1965  Cheryl Chandler is an 56 y.o. year old female who is a primary patient of [name], NP.  The [facility] Managed Care Coordination team was consulted for assistance with:  Mental Health Counseling and Resources  Assessments/Interventions:  Review of past medical history, allergies, medications, health status, including review of consultants reports, laboratory and other test data, was performed as part of comprehensive evaluation and provision of chronic care management services.  SDOH: (Social Determinant of Health) assessments and interventions performed: [name]    Flowsheet Row Patient Outreach Telephone from 01/19/2022 in [place] Patient Outreach from 01/01/2022 in [place] Patient Outreach Telephone from 12/11/2021 in [place] Patient Outreach Telephone from 12/05/2021 in [place] Patient Outreach Telephone from 11/17/2021 in [place] Patient Outreach Telephone from 11/03/2021 in [place] Coordination  [name]        Food Insecurity Interventions -- -- -- Other (Comment)  [has resources] -- --  Transportation Interventions -- Patient Resources Tax adviser) -- -- -- --  Museum/gallery curator Strain Interventions Intervention Not Indicated  [facility] BSW] Other (Comment)  [MM care team] -- -- -- Other (Comment)  [patient has set up payment plans for medical bills]  Stress Interventions Offered [employer], Provide Counseling Provide Counseling Offered [employer] Resources, Provide Counseling -- Live Life Well, Futures trader Wellness Resources --       Care Plan                  Allergies  Allergen Reactions   Codeine Shortness Of Breath and Rash   Percocet [Oxycodone-Acetaminophen] Shortness Of Breath   Sulfa Antibiotics Shortness Of Breath and Rash   Aspirin Hives   Bee Venom     Bee stings   Darvon [Propoxyphene]     Darvocet-N 100   Latex    Oxycodone Nausea And Vomiting   Penicillins     Has patient had a PCN reaction causing immediate rash, facial/tongue/throat swelling, SOB or lightheadedness with hypotension: Unknown Has patient had a PCN reaction causing severe rash involving mucus membranes or skin necrosis: Unknown Has patient had a PCN reaction that required hospitalization: Unknown Has patient had a PCN reaction occurring within the last 10 years: Unknown If all of the above answers are "NO", then may proceed with Cephalosporin use.    Medications Reviewed Today     Reviewed by [name], LCSW (Social Worker) on 01/19/22 at 16  Med List Status: <None>   Medication Order Taking? Sig Documenting Provider Last Dose Status Informant  ACCU-CHEK GUIDE test strip [id number] No USE TO CHECK BLOOD SUGAR 3 TIMES DAILY AS DIRECTED [name], NP Taking Active   Accu-Chek Softclix Lancets lancets [id number] No CHECK BLOOD SUGAR TWICE DAILY [name], NP Taking Active   albuterol (PROVENTIL) (2.5 MG/3ML) 0.083% nebulizer solution [id number]  USE 1 VIAL VIA NEBULIZER EVERY 4 HOURS AS NEEDED [name], NP  Active   amLODipine (NORVASC) 5 MG tablet [id number] No TAKE 1 TABLET BY MOUTH ONCE DAILY [name], NP Taking Active   atorvastatin (LIPITOR) 80 MG tablet [id number] No TAKE 1 TABLET BY MOUTH ONCE EVERY [name], NP Taking Active   Blood Glucose Monitoring Suppl (ACCU-CHEK GUIDE) w/Device KIT [id number] No Use to check  blood sugar 3 times a day. [name], NP Taking Active   Blood Pressure Monitoring (OMRON 3 SERIES BP MONITOR) DEVI [id number] No Use to check blood pressure as directed [name], NP Taking Active   citalopram (CELEXA) 40 MG tablet [id number] No TAKE 1 TABLET BY MOUTH ONCE DAILY [name], NP Taking Active   EPINEPHrine 0.3 mg/0.3 mL IJ SOAJ injection [id number] No INJECT 1 SYRINGE INTO OUTER THIGH ONCE AS NEEDED FOR SEVERE ALLERGIC REACTION. [name], NP Taking Active   furosemide (LASIX) 20 MG tablet [id number] No TAKE 1 TABLET BY MOUTH ONCE DAILY IN AFTERNOON AS NEEDED LEG EDEMA [name], NP Taking Active   gabapentin (NEURONTIN) 300 MG capsule [id number] No TAKE 1 CAPSULE BY MOUTH 3 TIMES DAILY ASNEEDED (NEED OFFICE VISIT FOR FURTHER REFILLS) [name], NP Taking Active   hydrALAZINE (APRESOLINE) 25 MG tablet [id number] No TAKE 1 TABLET BY MOUTH 3 TIMES DAILY [name], NP Taking Active   levothyroxine (SYNTHROID) 50 MCG tablet [id number] No TAKE 1 TABLET BY MOUTH ONCE DAILY ON AN EMPTY STOMACH. WAIT 30 MINUTES BEFORE TAKING OTHER MEDS. [name], NP Taking Active   lisinopril (ZESTRIL) 40 MG tablet [id number] No TAKE 1 TABLET BY MOUTH ONCE DAILY [name], NP Taking Active   meloxicam (MOBIC) 15 MG tablet [id number] No TAKE 1 TABLET BY MOUTH ONCE DAILY AS NEEDED WITH FOOD OR MILK [name], NP Taking Active   methocarbamol (ROBAXIN) 500 MG tablet [id number] No Take 500 mg by mouth every 8 (eight) hours as needed for muscle spasms. [provider] Taking Active   mometasone-formoterol (DULERA) 100-5 MCG/ACT AERO [id number] No INHALE 2 PUFFS BY MOUTH TWICE A DAY. RINSE MOUTH AFTER USE. [name], NP Taking Active   montelukast (SINGULAIR) 10 MG tablet [id number] No TAKE 1 TABLET BY MOUTH AT BEDTIME [name], NP Taking Active   nitroGLYCERIN (NITROSTAT) 0.4 MG SL tablet [id number]  DISSOLVE 1 TABLET UNDER TONGUE AT ONSET OF CHEST PAIN. REPEAT IN 5 MIN IF NOT RESOLVED, MAX 3 DOSES. 911 IF NEEDED. [name], NP  Active   omeprazole (PRILOSEC) 20 MG capsule [id number] No TAKE 1 CAPSULE BY  MOUTH ONCE DAILY [name], NP Taking Active   SPIRIVA HANDIHALER 18 MCG inhalation capsule [id number] No INHALE CONTENTS OF 1 CAPSULE ONCE DAILY [name], NP Taking Active   spironolactone (ALDACTONE) 25 MG tablet [id number] No Take 0.5 tablets (12.5 mg total) by mouth daily. [name], NP Taking Active   triamcinolone cream (KENALOG) 0.1 % [id number] No APPLY 1 APPLICATION TOPICALLY TWICE DAILY [name], [name], NP Taking Active   triamcinolone cream (KENALOG) 0.1 % [id number] No Apply 1 application. topically 2 (two) times daily. [name], NP Taking Active   TRULICITY 3.81 MM/0.3FV [name] [id number] No INJECT 0.75MG SUBQ ONCE A [name], NP Taking Active   Vitamin D, Ergocalciferol, (DRISDOL) 1.25 MG (50000 UNIT) CAPS capsule [id number] No TAKE 1 CAPSULE BY MOUTH EVERY 7 DAYS [name], NP Taking Active             Patient Active Problem List   Diagnosis Date Noted   Foot pain, right 09/01/2021   Fibrocystic disease of both breasts 05/07/2019   IFG (impaired fasting glucose) 05/03/2019   Chronic venous insufficiency 11/09/2018   Overactive bladder 09/30/2018   Peripheral neuropathy 09/30/2018   Apnea 01/02/2016   Lymphedema 11/30/2014   Dyspnea on exertion 11/30/2014   Anxiety 11/29/2014   Arthritis 11/29/2014   COPD (chronic obstructive  pulmonary disease) ([place]) 11/29/2014   Clinical depression 11/29/2014   H/O eating disorder 11/29/2014   Esophageal reflux 11/29/2014   Acne inversa 11/29/2014   Essential hypertension 11/29/2014   HLD (hyperlipidemia) 11/29/2014   Adult hypothyroidism 11/29/2014   Insomnia 11/29/2014   Low back pain 11/29/2014   Morbid obesity ([place]) 11/29/2014   Posttraumatic stress disorder 11/29/2014   Vitamin D deficiency 11/29/2014   Nicotine dependence, cigarettes, uncomplicated 23/76/2831    Conditions to be addressed/monitored per PCP order:  Depression  Care Plan : General Social Work (Adult)   Updates made by [name], LCSW since 01/29/2022 12:00 AM     Problem: Coping Skills (General Plan of Care)      Long-Range Goal: Coping Skills Enhanced   Start Date: 11/07/2020  Expected End Date: 03/07/2022  Recent Progress: On track  Priority: High  Note:   Timeframe:  Long-Range Goal Priority:  High Start Date:       08/26/21                    Expected End Date:  ongoing  Follow up date: 02/02/22 at 1:00 pm  Current Barriers:  Financial constraints Mental Health Concerns  Social Isolation Limited education about mental health support resources that are available to her within the local area* Cognitive Deficits Lacks knowledge of community resource  Clinical Social Work Clinical Goal(s):  Over the next 90 days, client will work with SW to address concerns related to experiencing ongoing symptoms of depression, sadness and grief since the passing of her mother Over the next 90 days, patient will work with LCSW to address needs related to implementing appropriate self-care and depression management.   Interventions: Patient interviewed and appropriate assessments performed Provided mental health counseling with regards to patient's loss and ongoing stressors. Patient was very close with her mother who passed away a few years ago and she continues to experience symptoms of grief from this loss. Patient denies wanting LCSW to make a referral for grief counseling through [facility] again but was receptive to coping skill and resource education/information that was provided during session today. Patient reports decorating for the holidays helps her to connect with her mother as they did this together when she was alive.  Provided patient with information about managing depression within her daily life. Patient has ongoing grief symptoms as well. Patient states "I feel worn down." Patient reports that she talks out loud to her mother's picture and this is very helpful to her. Grief  support resource education provided again during session today.  Provided reflective listening and implemented appropriate interventions to help suppport patient and her emotional needs  Discussed plans with patient for ongoing care management follow up and provided patient with direct contact information for [facility] team Advised patient to contact [facility] team with any urgent case management needs. Assisted patient/caregiver with obtaining information about health plan benefits LCSW completed past referral for family to have a wheelchair ramp built at their residence due to ongoing mobility concerns. Ramp has been successfully installed.  PCS actively involved with patient and providing ongoing services.  Positive reinforcement provided to patient for quitting smoking cigarettes. Coping skill review education provided for future triggers.  Brief self-care education provided to both patient and spouse. Patient confirms that she continues to go to food pantry for food insecurity. Resource information has been provided.  Patient reports ongoing pain and issues with mobility. Patient shares that her pain resides in her pains and legs.  Emotional support and coping skill education provided. Patient confirms that her PCS aide continues to provide her with services. However, aide has to be reminded to put on her mask since patient is high risk. LCSW reviewed resources that [name] provided her with in the past.  Patient reports that she had a wonderful birthday spent with her family this past May. Patient reports receiving appropriate socialization. Patient reports that her spouse took her out to eat and gifted her with several things which was a nice surprise.  Patient confirms stable transportation arrangements for upcoming PCP appointment this week.  Patient repots that she is losing a lot of weight but is not sure how much she has lost as she does not have a scale. Patient reports that a goal of hers is to lose  weight but she has not been intentionally losing weight at this time.  Patient reports that her aide [name] continues to assist her with PCS and is an excellent support within the home for her and spouse.  Patient planted a tomato plant and successfully grew over 5 tomatoes over the summer. Patient is very proud of this achievement as this was her first time planting a tomato plant and is now interested in picking up gardening/planting as a hobby. Patient reports that her aunt continues to be a positive support for her in her life and someone that she can rely on in the case of emergencies.  Patient was educated on healthy self-care skills and advised her to use these into her daily routine.  04/10/21- Update- Patient is agreeable to social work closure at this time as she still does not need medication management or counseling as she is managing her mental health well at this time. Patient talks to her younger sister everyday to gain emotional support. Patient says that her sister is into body building and helps to educate and remind her to do her own self-care. Patient is agreeable to contact [facility] LCSW directly if any social work concern arise. [facility] LCSW will complete case closure at this time.  Patient reports that she has been elevating her legs daily to elevate pain and swelling.  [facility] LCSW completed care coordination with Thomasene Lot on 01/15/21 and [facility] LCSW provided the following suggested resource-It is likely their best option given their loss of housing and lack of consistent income:  Publishing rights manager                                                                                                                      (Emergency [place]) [phone] Ext. [phone] M-F, 8am - 4pm Patient was successfully approved for food stamps but is still experiencing financial concerns. She reports that she and her husband have no clothes that fit them and they are having to start using  a string for their pants and patient only has one bra at this time. [facility] LCSW will send referral to [facility] BSW today for financial support.  BSW spoke with  patient about resources needed. Patient states she does receive SSI and was approved for foodstamps. Patient is familiar with all of the food pantries and other resources in [place]. BSW will assist patient with finding an eye doctor. Patient states she has not been in 10 years. BSW will research and put a list of eye doctors together that accept Medicaid in [facility]. 04/30/20: BSW contacted and spoke with patient, she stated she was not doing good because she found out that she has a new landlord and they are going up on her rent. BSW informed patient to make sure she informs her social workers at [employer] that they have an increase for foodstamps and Medicaid. Patient stated that she sleeps on the couch and is in need of a power recliner/lift chair. BSW will send patients PCP a message asking for a script to be sent. 05/21/21: BSW completed follow up telephone call with patient. She states she is unsure if their new landlord has received their January rent. Patient states she is also worried due to the new landlord making them pay rent online and on the 1st of the month. Patient states she is unable to get anyone on the phone. Patient states her rent will go up to 400 from 255 in March. Patient states she does not have many clothes but goes to [employer] to get some Materials engineer. No other resources needed at this time.   06/20/21: BSW completed follow up telelphone call with patient. Patient states she is doing well. Patient states [employer] [place] will cover all of Vaccines. Patient states no resources are needed at this time. 08/26/21: BSW completed telephone outreach with patient, she stated since going to the hospital she has been feeling weak. Patient states other than that she is feeling okay. Patient stated she is going to get a new phone on  Wednesday. Patient states her birthday is on Friday and her husband may take her to a [place]. No other resources are needed at this time.Update- Patient reports experiencing additional grief lately. [facility] LCSW offered referral for berievement counseling but she declined. UPDATE 08/26/21- Patient reports that she has a near death experience last month. Patient shares that she had a fall. 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 [facility] LCSW completed education on available mental health resources within her area to utilize. Patient admits to experiencing some sadness due to grief from her mother's passing. Emotional support provided. 10/13/21 UPDATE- Patient reports that she is working hard on living a healthier life both physically and mentally. Patient reports that she is making active changes in her diet. She reports that she eventually wishes to quit smoking again and has started picking up new hobbies to implement during times of craving such as coloring, crocheting and being creative. Patient reports that she is unsure if she needs mental health support at this time because her main concerns are centered around her and her spouse's physical health instead their mental health. [facility] LCSW educated patient on how they affect one another and their importance. Patient denies any urgent concerns. She reports no issues with affording food due to food stamp assistance. [facility] LCSW will reassess social work needs 30 days from now. UPDATE 11/17/21- Patient reports that she is experiencing several stressors at this time. She was encouraged to consider mental health treatment but she is not interested at this time. Patient was encouraged to consider mental health resources. Patient reports that their car is in the shop and that they  have no stable transportation besides their sister and her husband's father. Patient reports that her spouse has had several recent health issues that have affected her mental state.  Patient was educated on healthy coping skills for stress. Patient denies no urgent concerns or issues at this time. UPDATE- Care Coordination completed with [facility] LCSW, PCP office, Pharmacist, [facility] BSW and PCP. Patient has canceled several medical appointments because they are unable to afford a copay. Patient will benefit from financial assistance and may benefit from a [facility] application. Patient has an upcoming appointment with [facility] BSW next week. Patient is agreeable to exploring financial assistance options with [facility] BSW that will help alleviate other areas of financial stress within their life in order for patient to be able to afford medical expenses. [facility] LCSW encouraged patient to consider individual counseling. Patient is agreeable to referral for counseling but needs to find out her sister's email first as she is unable to go to office to fill out intake paperwork. Patient prefers virtual counseling to save money on transportation. [facility] LCSW will successfully place referral to [facility] during next outreach. [facility] LCSW completed care coordination with [facility] BSW.  BSW 12/15/21: BSW completed a telephone outreach with patient. She stated everything is still going okay. She has been having problems with her leg wraps and has an appointment today. She states her husband is still having issues with his eye and with the doctor and she is going to make a compliant. Patient states no resources are needed at this time. [facility] LCSW 01/19/22: Patient reports that she was unable to sleep last night. She is experiencing a break out skin reaction that she feels is caused by stress. Her financial stressors have increased since last session. Patient is agreeable to [facility] LCSW placing referral for [facility] now but reports that she has no email address. [facility] LCSW completed referral and made note of her inability to get into her email. [facility] LCSW will follow up within two weeks to ensure that she was enrolled in [facility] counseling program. [facility] completed care coordination call ONLY to [facility] and they were unable to locate patient's referral that was made on 01/19/22. [facility] LCSW resubmitted this referral today on 01/29/22 for telephonic counseling. Patient was made aware by telephone call that [facility] will contact her by the end of the day tomorrow to schedule a time for her to come into their office to complete in person intake paperwork. Patient is agreeable to this plan.  Managing Loss, Adult People experience loss in many different ways throughout their lives. Events such as moving, changing jobs, and losing friends can create a sense of loss. The loss may be as serious as a major health change, divorce, death of a pet, or death of a loved one. All of these types of loss are likely to create a physical and emotional reaction known as grief. Grief is the result of a major change or an absence of something or someone that you count on. Grief is a normal reaction to loss. A variety of factors can affect your grieving experience, including: The nature of your loss. Your relationship to what or whom you lost. Your understanding of grief and how to manage it. Your support system. How to manage lifestyle changes Keep to your normal routine as much as possible. If you have trouble focusing or doing normal activities, it is acceptable to take some time away from your normal routine. Spend time with  friends and loved ones. Eat a healthy diet, get plenty of sleep, and rest when you feel tired. How to recognize changes  The way that you deal with your grief will affect your ability to function as you normally do. When grieving, you may experience these changes: Numbness, shock, sadness, anxiety, anger, denial, and guilt. Thoughts about death. Unexpected crying. A physical sensation of emptiness in your stomach. Problems sleeping and eating. Tiredness (fatigue). Loss of interest in  normal activities. Dreaming about or imagining seeing the person who died. A need to remember what or whom you lost. Difficulty thinking about anything other than your loss for a period of time. Relief. If you have been expecting the loss for a while, you may feel a sense of relief when it happens. Follow these instructions at home:    Activity Express your feelings in healthy ways, such as: Talking with others about your loss. It may be helpful to find others who have had a similar loss, such as a support group. Writing down your feelings in a journal. Doing physical activities to release stress and emotional energy. Doing creative activities like painting, sculpting, or playing or listening to music. Practicing resilience. This is the ability to recover and adjust after facing challenges. Reading some resources that encourage resilience may help you to learn ways to practice those behaviors. General instructions Be patient with yourself and others. Allow the grieving process to happen, and remember that grieving takes time. It is likely that you may never feel completely done with some grief. You may find a way to move on while still cherishing memories and feelings about your loss. Accepting your loss is a process. It can take months or longer to adjust. Keep all follow-up visits as told by your health care provider. This is important. Where to find support To get support for managing loss: Ask your health care provider for help and recommendations, such as grief counseling or therapy. Think about joining a support group for people who are managing a loss. Where to find more information You can find more information about managing loss from: American Society of Clinical Oncology: www.cancer.net American Psychological Association: TVStereos.ch Contact a health care provider if: Your grief is extreme and keeps getting worse. You have ongoing grief that does not improve. Your body shows  symptoms of grief, such as illness. You feel depressed, anxious, or lonely. Get help right away if: You have thoughts about hurting yourself or others. If you ever feel like you may hurt yourself or others, or have thoughts about taking your own life, get help right away. You can go to your nearest emergency department or call: Your local emergency services (911 in the [place] A suicide crisis helpline, such as the [facility] at 2511908482. This is open 24 hours a day. Summary Grief is the result of a major change or an absence of someone or something that you count on. Grief is a normal reaction to loss. The depth of grief and the period of recovery depend on the type of loss and your ability to adjust to the change and process your feelings. Processing grief requires patience and a willingness to accept your feelings and talk about your loss with people who are supportive. It is important to find resources that work for you and to realize that people experience grief differently. There is not one grieving process that works for everyone in the same way. Be aware that when grief becomes extreme, it can  lead to more severe issues like isolation, depression, anxiety, or suicidal thoughts. Talk with your health care provider if you have any of these issues. This information is not intended to replace advice given to you by your health care provider. Make sure you discuss any questions you have with your health care provider. Document Revised: 06/17/2018 Document Reviewed: 08/27/2016 Elsevier Patient Education  [place] for Managing Grief   When you are experiencing grief, many resources and support are available to help you. You are not alone.    Grief Share (https://[email or web address]://[email or web address]-[email or web address]):    [employer] [place], [place]  [phone] [address] [place], [place], [place], [place], [place]   [facility] [address] [place], [place]   [place], [place]   If a Hospice or [place] team has been involved in the care of your loved one, you may reach out to your contact there or locally, you may reach out to Hospice of [facility]:    Hospice and [place] of [facility]   [place], [place] [address] [phone] Patient was educated on healthy self-care as she admits she has little motivation to walk lately. PCS recommendation was suggested.   Patient Self Care Activities:  Attends all scheduled provider appointments Calls provider office for new concerns or questions  Please see past updates related to this goal by clicking on the "Past Updates" button in the selected goal        Follow up:  Patient agrees to Care Plan and Follow-up.  Plan: The Managed Medicaid care management team will reach out to the patient again over the next 30 days.  Date of next scheduled Social Work care management/care coordination outreach:  02/02/22  Eula Fried, BSW, MSW, LCSW Managed Medicaid LCSW [email or web address] Phone: [phone]

## 2022-01-29 NOTE — Patient Instructions (Signed)
Visit Information  Cheryl Chandler was given information about Medicaid Managed Care team care coordination services as a part of their [employer] Medicaid benefit. Cheryl Chandler verbally consented to engagement with the [facility] Managed Care team.   If you are experiencing a medical emergency, please call 911 or report to your local emergency department or urgent care.   If you have a non-emergency medical problem during routine business hours, please contact your provider's office and ask to speak with a nurse.   For questions related to your [facility] health plan, please call: [phone] or go here:[email or web address]  If you would like to schedule transportation through your [facility] plan, please call the following number at least 2 days in advance of your appointment: [phone].  You can also use the MTM portal or MTM mobile app to manage your rides. For the portal, please go to [email or web address]  Call the [facility] Crisis Line at 5314208342, at any time, 24 hours a day, 7 days a week. If you are in danger or need immediate medical attention call 911.  If you would like help to quit smoking, call 1-800-QUIT-NOW ((845)130-5875) OR Espaol: 1-855-Djelo-Ya (8-453-646-8032) o para ms informacin haga clic aqu or Text READY to 122-482 to register via text   Following is a copy of your plan of care:  Care Plan : General Social Work (Adult)  Updates made by Gustavus Bryant, LCSW since 01/29/2022 12:00 AM     Problem: Coping Skills (General Plan of Care)      Long-Range Goal: Coping Skills Enhanced   Start Date: 11/07/2020  Expected End Date: 03/07/2022  Recent Progress: On track  Priority: High  Note:   Timeframe:  Long-Range Goal Priority:  High Start Date:       08/26/21                    Expected End Date:  ongoing  Follow up date: 02/02/22 at 1:00 pm  Current Barriers:  Financial constraints Mental Health Concerns  Social  Isolation Limited education about mental health support resources that are available to her within the local area* Cognitive Deficits Lacks knowledge of community resource  Clinical Social Work Clinical Goal(s):  Over the next 90 days, client will work with SW to address concerns related to experiencing ongoing symptoms of depression, sadness and grief since the passing of her mother Over the next 90 days, patient will work with LCSW to address needs related to implementing appropriate self-care and depression management.   Managing Loss, Adult People experience loss in many different ways throughout their lives. Events such as moving, changing jobs, and losing friends can create a sense of loss. The loss may be as serious as a major health change, divorce, death of a pet, or death of a loved one. All of these types of loss are likely to create a physical and emotional reaction known as grief. Grief is the result of a major change or an absence of something or someone that you count on. Grief is a normal reaction to loss. A variety of factors can affect your grieving experience, including: The nature of your loss. Your relationship to what or whom you lost. Your understanding of grief and how to manage it. Your support system. How to manage lifestyle changes Keep to your normal routine as much as possible. If you have trouble focusing or doing normal activities, it is acceptable to take some time away from your normal routine. Spend time with  friends and loved ones. Eat a healthy diet, get plenty of sleep, and rest when you feel tired. How to recognize changes  The way that you deal with your grief will affect your ability to function as you normally do. When grieving, you may experience these changes: Numbness, shock, sadness, anxiety, anger, denial, and guilt. Thoughts about death. Unexpected crying. A physical sensation of emptiness in your stomach. Problems sleeping and  eating. Tiredness (fatigue). Loss of interest in normal activities. Dreaming about or imagining seeing the person who died. A need to remember what or whom you lost. Difficulty thinking about anything other than your loss for a period of time. Relief. If you have been expecting the loss for a while, you may feel a sense of relief when it happens. Follow these instructions at home:    Activity Express your feelings in healthy ways, such as: Talking with others about your loss. It may be helpful to find others who have had a similar loss, such as a support group. Writing down your feelings in a journal. Doing physical activities to release stress and emotional energy. Doing creative activities like painting, sculpting, or playing or listening to music. Practicing resilience. This is the ability to recover and adjust after facing challenges. Reading some resources that encourage resilience may help you to learn ways to practice those behaviors. General instructions Be patient with yourself and others. Allow the grieving process to happen, and remember that grieving takes time. It is likely that you may never feel completely done with some grief. You may find a way to move on while still cherishing memories and feelings about your loss. Accepting your loss is a process. It can take months or longer to adjust. Keep all follow-up visits as told by your health care provider. This is important. Where to find support To get support for managing loss: Ask your health care provider for help and recommendations, such as grief counseling or therapy. Think about joining a support group for people who are managing a loss. Where to find more information You can find more information about managing loss from: American Society of Clinical Oncology: www.cancer.net American Psychological Association: DiceTournament.ca Contact a health care provider if: Your grief is extreme and keeps getting worse. You have  ongoing grief that does not improve. Your body shows symptoms of grief, such as illness. You feel depressed, anxious, or lonely. Get help right away if: You have thoughts about hurting yourself or others. If you ever feel like you may hurt yourself or others, or have thoughts about taking your own life, get help right away. You can go to your nearest emergency department or call: Your local emergency services (911 in the [place] A suicide crisis helpline, such as the National Suicide Prevention Lifeline at 639-657-6150. This is open 24 hours a day. Summary Grief is the result of a major change or an absence of someone or something that you count on. Grief is a normal reaction to loss. The depth of grief and the period of recovery depend on the type of loss and your ability to adjust to the change and process your feelings. Processing grief requires patience and a willingness to accept your feelings and talk about your loss with people who are supportive. It is important to find resources that work for you and to realize that people experience grief differently. There is not one grieving process that works for everyone in the same way. Be aware that when grief becomes extreme, it can  lead to more severe issues like isolation, depression, anxiety, or suicidal thoughts. Talk with your health care provider if you have any of these issues. This information is not intended to replace advice given to you by your health care provider. Make sure you discuss any questions you have with your health care provider. Document Revised: 06/17/2018 Document Reviewed: 08/27/2016 Elsevier Patient Education  [place] for Managing Grief   When you are experiencing grief, many resources and support are available to help you. You are not alone.    Grief Share ([email or web address]):    [employer] [place], [place]  [phone] S.  [place], [place], [place], [place], [place]   [facility] [address] [place], [place]   [place], [place]   If a Hospice or [place] team has been involved in the care of your loved one, you may reach out to your contact there or locally, you may reach out to Hospice of [facility]:    Hospice and [place] of [facility]   [place], [place] [address] [phone] Patient was educated on healthy self-care as she admits she has little motivation to walk lately. PCS recommendation was suggested.   Patient Self Care Activities:  Attends all scheduled provider appointments Calls provider office for new concerns or questions  Please see past updates related to this goal by clicking on the "Past Updates" button in the selected goal

## 2022-01-30 ENCOUNTER — Ambulatory Visit: Payer: Medicaid Other | Admitting: Cardiology

## 2022-01-30 ENCOUNTER — Other Ambulatory Visit: Payer: Self-pay | Admitting: Nurse Practitioner

## 2022-01-30 NOTE — Telephone Encounter (Signed)
Requested Prescriptions  Pending Prescriptions Disp Refills  . Accu-Chek Softclix Lancets lancets [Pharmacy Med Name: ACCU-CHEK SOFTCLIX LANCETS] 100 each 1    Sig: [place]     Endocrinology: Diabetes - Testing Supplies Passed - 01/30/2022 11:36 AM      Passed - Valid encounter within last 12 months    Recent Outpatient Visits          1 month ago Appointment canceled by hospital   [facility] [name], NP   1 month ago Essential hypertension   [place], NP   3 months ago Essential hypertension   [facility] [name], NP   4 months ago Essential hypertension   [facility] [name], NP   7 months ago Essential hypertension   [facility] [name], NP      Future Appointments            In 2 weeks [name], NP [facility], [facility]   In 1 month Gollan, Kathlene November, MD [place]

## 2022-02-02 ENCOUNTER — Ambulatory Visit (INDEPENDENT_AMBULATORY_CARE_PROVIDER_SITE_OTHER): Payer: Medicaid Other | Admitting: Pulmonary Disease

## 2022-02-02 ENCOUNTER — Other Ambulatory Visit (HOSPITAL_COMMUNITY): Payer: Self-pay

## 2022-02-02 ENCOUNTER — Encounter: Payer: Self-pay | Admitting: Pulmonary Disease

## 2022-02-02 VITALS — BP 138/86 | HR 65 | Temp 97.9°F | Ht 62.0 in | Wt 320.0 lb

## 2022-02-02 DIAGNOSIS — I517 Cardiomegaly: Secondary | ICD-10-CM

## 2022-02-02 DIAGNOSIS — R0602 Shortness of breath: Secondary | ICD-10-CM | POA: Diagnosis not present

## 2022-02-02 DIAGNOSIS — E668 Other obesity: Secondary | ICD-10-CM | POA: Diagnosis not present

## 2022-02-02 DIAGNOSIS — J449 Chronic obstructive pulmonary disease, unspecified: Secondary | ICD-10-CM

## 2022-02-02 DIAGNOSIS — R5381 Other malaise: Secondary | ICD-10-CM

## 2022-02-02 DIAGNOSIS — E669 Obesity, unspecified: Secondary | ICD-10-CM

## 2022-02-02 MED ORDER — TRELEGY ELLIPTA 100-62.5-25 MCG/ACT IN AEPB: 1.0000 | INHALATION_SPRAY | Freq: Every day | RESPIRATORY_TRACT | 11 refills | Status: DC

## 2022-02-02 MED ORDER — TRELEGY ELLIPTA 100-62.5-25 MCG/ACT IN AEPB: 1.0000 | INHALATION_SPRAY | Freq: Every day | RESPIRATORY_TRACT | 0 refills | Status: DC

## 2022-02-02 NOTE — Progress Notes (Signed)
Subjective:    Patient ID: Cheryl Chandler, female    DOB: 02/04/1966, 56 y.o.   MRN: [id number] Patient Care Team: [name], NP as PCP - General Craft, Calvert Cantor, RN as Case Manager [name] as Social Worker Clearance Coots, Janace Litten, RPH-CPP (Pharmacist) Gustavus Bryant, LCSW as [employer] Care Management (Licensed Clinical Social Worker)  Chief Complaint  Patient presents with   Consult    COPD. Constant SOB, wheezing and cough. Occasional clear sputum.    HPI Patient is a 56 year old current smoker with a 90-pack-year history of smoking, morbid obesity and a history as noted below who presents for evaluation of dyspnea and coughing.  Referred by [name], NP.  The patient states that she has had shortness of breath "all the time" for a long period of time.  As noted she is morbidly obese she estimates that her weight is somewhere around 320 pounds.  She is for the most part wheelchair-bound due to significant neuropathy and back pain.  She continues to smoke approximately a half a pack of cigarettes per day but states that this is down from 3 packs of cigarettes per day previously.  She gets relief from albuterol nebulization treatments which she uses every 6 hours.  She is on Dulera and Spiriva HandiHaler as well.  She is not sure that these really help her much.  She endorses a cough productive of whitish to grayish sputum.  No hemoptysis.  She has chest pressure and occasional chest pain and noticed gastroesophageal reflux symptoms with significant acid heartburn.  She keeps a sore throat due to her reflux issues.  She notes nasal congestion and difficulty breathing through her nose.  She has issues with joint stiffness and pain and with lower extremity edema.  She has to sleep sitting up due to her multiple issues.  For the most part she complains of insomnia and not of sleep disordered breathing.  She states she cannot have an in lab sleep study due to  inability to get to the lab.  As noted these issues have all been longstanding (years).  She has been disabled for a number of years.  She lives with her husband in [place], [place].   Review of Systems A 10 point review of systems was performed and it is as noted above otherwise negative.  Past Medical History:  Diagnosis Date   Arthritis    Asthma    COPD (chronic obstructive pulmonary disease) (HCC)    Hypertension    Neuropathy    Pericarditis    Personal history of urinary calculi 11/29/2014   Thyroid disease    Past Surgical History:  Procedure Laterality Date   APPENDECTOMY     arm surgery Left    pins   fatty tumor removed from back     JOINT REPLACEMENT Right    LEG SURGERY Bilateral    OVARIAN CYST SURGERY Right    took piece of right ovary due to cyst   TOTAL HIP ARTHROPLASTY Right    wisdom teeth removed     Patient Active Problem List   Diagnosis Date Noted   Foot pain, right 09/01/2021   Fibrocystic disease of both breasts 05/07/2019   IFG (impaired fasting glucose) 05/03/2019   Chronic venous insufficiency 11/09/2018   Overactive bladder 09/30/2018   Peripheral neuropathy 09/30/2018   Apnea 01/02/2016   Lymphedema 11/30/2014   Dyspnea on exertion 11/30/2014   Anxiety 11/29/2014   Arthritis 11/29/2014  COPD (chronic obstructive pulmonary disease) (HCC) 11/29/2014   Clinical depression 11/29/2014   H/O eating disorder 11/29/2014   Esophageal reflux 11/29/2014   Acne inversa 11/29/2014   Essential hypertension 11/29/2014   HLD (hyperlipidemia) 11/29/2014   Adult hypothyroidism 11/29/2014   Insomnia 11/29/2014   Low back pain 11/29/2014   Morbid obesity (HCC) 11/29/2014   Posttraumatic stress disorder 11/29/2014   Vitamin D deficiency 11/29/2014   Nicotine dependence, cigarettes, uncomplicated 11/29/2014   Family History  Problem Relation Age of Onset   Seizures Sister        disorders   Social History   Tobacco Use   Smoking status:  Every Day    Packs/day: 3.00    Years: 30.00    Total pack years: 90.00    Types: Cigarettes   Smokeless tobacco: Never   Tobacco comments:    0.5 PPD. 02/02/2022  Substance Use Topics   Alcohol use: No    Alcohol/week: 0.0 standard drinks of alcohol   Allergies  Allergen Reactions   Codeine Shortness Of Breath and Rash   Percocet [Oxycodone-Acetaminophen] Shortness Of Breath   Sulfa Antibiotics Shortness Of Breath and Rash   Aspirin Hives   Bee Venom     Bee stings   Darvon [Propoxyphene]     Darvocet-N 100   Latex    Oxycodone Nausea And Vomiting   Penicillins     Has patient had a PCN reaction causing immediate rash, facial/tongue/throat swelling, SOB or lightheadedness with hypotension: Unknown Has patient had a PCN reaction causing severe rash involving mucus membranes or skin necrosis: Unknown Has patient had a PCN reaction that required hospitalization: Unknown Has patient had a PCN reaction occurring within the last 10 years: Unknown If all of the above answers are "NO", then may proceed with Cephalosporin use.   Current Meds  Medication Sig   ACCU-CHEK GUIDE test strip USE TO CHECK BLOOD SUGAR 3 TIMES DAILY AS DIRECTED   Accu-Chek Softclix Lancets lancets CHECK BLOOD SUGAR TWICE DAILY   albuterol (PROVENTIL) (2.5 MG/3ML) 0.083% nebulizer solution USE 1 VIAL VIA NEBULIZER EVERY 4 HOURS AS NEEDED   amLODipine (NORVASC) 5 MG tablet TAKE 1 TABLET BY MOUTH ONCE DAILY   atorvastatin (LIPITOR) 80 MG tablet TAKE 1 TABLET BY MOUTH ONCE EVERY EVENING   Blood Glucose Monitoring Suppl (ACCU-CHEK GUIDE) w/Device KIT Use to check blood sugar 3 times a day.   Blood Pressure Monitoring (OMRON 3 SERIES BP MONITOR) DEVI Use to check blood pressure as directed   citalopram (CELEXA) 40 MG tablet TAKE 1 TABLET BY MOUTH ONCE DAILY   EPINEPHrine 0.3 mg/0.3 mL IJ SOAJ injection INJECT 1 SYRINGE INTO OUTER THIGH ONCE AS NEEDED FOR SEVERE ALLERGIC REACTION.   furosemide (LASIX) 20 MG tablet  TAKE 1 TABLET BY MOUTH ONCE DAILY IN AFTERNOON AS NEEDED LEG EDEMA   gabapentin (NEURONTIN) 300 MG capsule TAKE 1 CAPSULE BY MOUTH 3 TIMES DAILY ASNEEDED (NEED OFFICE VISIT FOR FURTHER REFILLS)   hydrALAZINE (APRESOLINE) 25 MG tablet TAKE 1 TABLET BY MOUTH 3 TIMES DAILY   levothyroxine (SYNTHROID) 50 MCG tablet TAKE 1 TABLET BY MOUTH ONCE DAILY ON AN EMPTY STOMACH. WAIT 30 MINUTES BEFORE TAKING OTHER MEDS.   lisinopril (ZESTRIL) 40 MG tablet TAKE 1 TABLET BY MOUTH ONCE DAILY   meloxicam (MOBIC) 15 MG tablet TAKE 1 TABLET BY MOUTH ONCE DAILY AS NEEDED WITH FOOD OR MILK   mometasone-formoterol (DULERA) 100-5 MCG/ACT AERO INHALE 2 PUFFS BY MOUTH TWICE A DAY. RINSE MOUTH  AFTER USE.   montelukast (SINGULAIR) 10 MG tablet TAKE 1 TABLET BY MOUTH AT BEDTIME   nitroGLYCERIN (NITROSTAT) 0.4 MG SL tablet DISSOLVE 1 TABLET UNDER TONGUE AT ONSET OF CHEST PAIN. REPEAT IN 5 MIN IF NOT RESOLVED, MAX 3 DOSES. 911 IF NEEDED.   omeprazole (PRILOSEC) 20 MG capsule TAKE 1 CAPSULE BY MOUTH ONCE DAILY   SPIRIVA HANDIHALER 18 MCG inhalation capsule INHALE CONTENTS OF 1 CAPSULE ONCE DAILY   spironolactone (ALDACTONE) 25 MG tablet Take 0.5 tablets (12.5 mg total) by mouth daily.   triamcinolone cream (KENALOG) 0.1 % Apply 1 application. topically 2 (two) times daily.   TRULICITY 0.75 MG/0.5ML SOPN INJECT 0.75MG  SUBQ ONCE A WEEK   Vitamin D, Ergocalciferol, (DRISDOL) 1.25 MG (50000 UNIT) CAPS capsule TAKE 1 CAPSULE BY MOUTH EVERY 7 DAYS   Immunization History  Administered Date(s) Administered   Influenza,inj,Quad PF,6+ Mos 01/30/2013, 01/29/2014, 01/13/2019   Moderna SARS-COV2 Booster Vaccination 11/05/2020   Moderna Sars-Covid-2 Vaccination 12/25/2019, 01/22/2020, 01/22/2020   Pneumococcal Polysaccharide-23 01/30/2013   Tdap 09/18/2021   Zoster Recombinat (Shingrix) 09/18/2021, 12/16/2021       Objective:   Physical Exam BP 138/86 (BP Location: Left Arm, Cuff Size: Large)   Pulse 65   Temp 97.9 F (36.6 C)    Ht  (1.575 m)   Wt (!) 320 lb (145.2 kg) Comment: Per patient- in a wheelchair  LMP 06/09/2017 (Approximate)   SpO2 96%   BMI 58.53 kg/m  GENERAL: With the obese woman, presents in wheelchair.  Poorly kempt, no conversational dyspnea.  No distress. HEAD: Normocephalic, atraumatic.  EYES: Pupils equal, round, reactive to light.  No scleral icterus.  MOUTH: Nose/mouth/throat not examined due to masking requirements for COVID 19.  Patient wearing mask today. NECK: Supple. No thyromegaly. Trachea midline. No JVD.  No adenopathy. PULMONARY: Good air entry bilaterally.  No adventitious sounds. CARDIOVASCULAR: S1 and S2. Regular rate and rhythm.  Distant heart tones.  No rubs murmurs or gallops noted. ABDOMEN: Protuberant/obese, otherwise benign. MUSCULOSKELETAL: No joint deformity, no clubbing.  She has significant lymphedema of the lower extremities, difficult to assess due to nonpitting edema. NEUROLOGIC: Grossly nonfocal.  Gait not tested. SKIN: Intact,warm,dry.  Significant chronic stasis dermatitis changes bilateral extremities.  Skin is lichenified in the lower extremities. PSYCH: Flat affect.  Passive behavior.  This x-ray performed 28 July 2021, this was reviewed, shows cardiomegaly. No active disease:     Assessment & Plan:     ICD-10-CM   1. COPD suggested by initial evaluation (HCC)  J44.9    Will obtain PFTs Trial of Trelegy Ellipta 100/82.5/25 Discontinue Dulera and Spiriva    2. SOB (shortness of breath)  R06.02 Pulmonary Function Test [facility] Only    Pulse oximetry, overnight    ECHOCARDIOGRAM COMPLETE   PFTs, 2D echo Overnight oximetry on room air    3. Cardiomegaly  I51.7    Noted on chest x-ray 2D echo as above    4. Extreme obesity  E66.8    Extreme obesity with obesity hypoventilation likely Patient would benefit from weight loss    5. Physical deconditioning  R53.81    This issue adds complexity to her management She would benefit from a conditioning  program     Orders Placed This Encounter  Procedures   Pulmonary Function Test [facility] Only    Standing Status:   Future    Number of Occurrences:   1    Standing Expiration Date:   02/03/2023  Order Specific Question:   Full PFT: includes the following: basic spirometry, spirometry pre & post bronchodilator, diffusion capacity (DLCO), lung volumes    Answer:   Full PFT    Order Specific Question:   This test can only be performed at    Answer:   [facility]   Pulse oximetry, overnight    Room Air & No DME company preference    Standing Status:   Future    Standing Expiration Date:   02/03/2023   ECHOCARDIOGRAM COMPLETE    Standing Status:   Future    Number of Occurrences:   1    Standing Expiration Date:   02/03/2023    Order Specific Question:   Where should this test be performed    Answer:   [facility]    Order Specific Question:   Perflutren DEFINITY (image enhancing agent) should be administered unless hypersensitivity or allergy exist    Answer:   Administer Perflutren    Order Specific Question:   Reason for exam-Echo    Answer:   Dyspnea  R06.00   Meds ordered this encounter  Medications   Fluticasone-Umeclidin-Vilant (TRELEGY ELLIPTA) 100-62.5-25 MCG/ACT AEPB    Sig: Inhale 1 puff into the lungs daily.    Dispense:  28 each    Refill:  11   Fluticasone-Umeclidin-Vilant (TRELEGY ELLIPTA) 100-62.5-25 MCG/ACT AEPB    Sig: Inhale 1 puff into the lungs daily.    Dispense:  1 each    Refill:  0    Order Specific Question:   Lot Number?    Answer:   [id number]    Order Specific Question:   Expiration Date?    Answer:   06/25/2023    Order Specific Question:   Manufacturer?    Answer:   [employer] [[id number]]    Order Specific Question:   Quantity    Answer:   1   We have ordered test to appropriately investigate the patient's issues with dyspnea.  Ultimately however it appears that a significant portion of her issues may be related to morbid obesity with  deconditioning.  Counseled regards discontinuation of smoking.  This will be imperative to help with her breathing issues and cough issues.  We will see her in follow-up in 3 months time she is to contact us prior to that time should any new difficulties arise.  Gailen Shelter, MD Advanced Bronchoscopy PCCM [facility]    *This note was dictated using voice recognition software/Dragon.  Despite best efforts to proofread, errors can occur which can change the meaning. Any transcriptional errors that result from this process are unintentional and may not be fully corrected at the time of dictation.

## 2022-02-02 NOTE — Patient Instructions (Signed)
We are getting breathing tests.  We are also getting a heart test to evaluate your shortness of breath.  Also getting an oxygen level when you sleep.  Switching your inhalers to Trelegy Ellipta this is 1 puff daily.  Let us know how you do with this inhaler.  DO NOT USE THE DULERA OR SPIRIVA ANY LONGER.

## 2022-02-03 ENCOUNTER — Telehealth: Payer: Self-pay

## 2022-02-03 ENCOUNTER — Other Ambulatory Visit (HOSPITAL_COMMUNITY): Payer: Self-pay

## 2022-02-03 ENCOUNTER — Other Ambulatory Visit: Payer: Self-pay | Admitting: Licensed Clinical Social Worker

## 2022-02-03 NOTE — Patient Outreach (Signed)
Medicaid Managed Care Social Work Note  02/03/2022 Name:  Cheryl Chandler MRN:  [id number] DOB:  24-Feb-1966  Cheryl Chandler is an 56 y.o. year old female who is a primary patient of [name], NP.  The Medicaid Managed Care Coordination team was consulted for assistance with:  [place] and Resources  [name] was given information about Medicaid Managed Care Coordination team services today. [place] Patient agreed to services and verbal consent obtained.  Engaged with patient  for by telephone forfollow up visit in response to referral for case management and/or care coordination services.   Assessments/Interventions:  Review of past medical history, allergies, medications, health status, including review of consultants reports, laboratory and other test data, was performed as part of comprehensive evaluation and provision of chronic care management services.  SDOH: (Social Determinant of Health) assessments and interventions performed: [name]    Flowsheet Row Patient Outreach Telephone from 01/19/2022 in [place] Patient Outreach from 01/01/2022 in [place] Patient Outreach Telephone from 12/11/2021 in [place] Patient Outreach Telephone from 12/05/2021 in [place] Patient Outreach Telephone from 11/17/2021 in [place] Patient Outreach Telephone from 11/03/2021 in [place] Coordination  [name]        Food Insecurity Interventions -- -- -- Other (Comment)  [has resources] -- --  Transportation Interventions -- Patient Resources Tax adviser) -- -- -- --  Museum/gallery curator Strain Interventions Intervention Not Indicated  [facility] BSW] Other (Comment)  [MM care team] -- -- -- Other (Comment)  [patient  has set up payment plans for medical bills]  Stress Interventions Offered [employer], Provide Counseling Provide Counseling [place], Provide Counseling -- [facility] Well, [employer] [facility] Resources --       Advanced Directives Status:  See Care Plan for related entries.  Care Plan                 Allergies  Allergen Reactions   Codeine Shortness Of Breath and Rash   Percocet [Oxycodone-Acetaminophen] Shortness Of Breath   Sulfa Antibiotics Shortness Of Breath and Rash   Aspirin Hives   Bee Venom     Bee stings   Darvon [Propoxyphene]     Darvocet-N 100   Latex    Oxycodone Nausea And Vomiting   Penicillins     Has patient had a PCN reaction causing immediate rash, facial/tongue/throat swelling, SOB or lightheadedness with hypotension: Unknown Has patient had a PCN reaction causing severe rash involving mucus membranes or skin necrosis: Unknown Has patient had a PCN reaction that required hospitalization: Unknown Has patient had a PCN reaction occurring within the last 10 years: Unknown If all of the above answers are "NO", then may proceed with Cephalosporin use.    Medications Reviewed Today     Reviewed by Hettie Holstein, CMA (Certified Medical Assistant) on 02/02/22 at 81  Med List Status: <None>   Medication Order Taking? Sig Documenting Provider Last Dose Status Informant  ACCU-CHEK GUIDE test strip [id number] Yes USE TO CHECK BLOOD SUGAR 3 TIMES DAILY AS DIRECTED [name], NP Taking Active   Accu-Chek Softclix Lancets lancets [id number] Yes CHECK BLOOD SUGAR TWICE DAILY [name], NP Taking Active   albuterol (PROVENTIL) (2.5 MG/3ML) 0.083% nebulizer solution [id number] Yes USE 1 VIAL VIA NEBULIZER EVERY 4 HOURS AS NEEDED [name], NP Taking Active  amLODipine (NORVASC) 5 MG tablet [id number] Yes TAKE 1 TABLET BY MOUTH ONCE DAILY [name], NP Taking Active   atorvastatin  (LIPITOR) 80 MG tablet [id number] Yes TAKE 1 TABLET BY MOUTH ONCE EVERY [name], NP Taking Active   Blood Glucose Monitoring Suppl (ACCU-CHEK GUIDE) w/Device KIT [id number] Yes Use to check blood sugar 3 times a day. [name], NP Taking Active   Blood Pressure Monitoring (OMRON 3 SERIES BP MONITOR) DEVI [id number] Yes Use to check blood pressure as directed [name], NP Taking Active   citalopram (CELEXA) 40 MG tablet [id number] Yes TAKE 1 TABLET BY MOUTH ONCE DAILY [name], NP Taking Active   EPINEPHrine 0.3 mg/0.3 mL IJ SOAJ injection [id number] Yes INJECT 1 SYRINGE INTO OUTER THIGH ONCE AS NEEDED FOR SEVERE ALLERGIC REACTION. [name], NP Taking Active   furosemide (LASIX) 20 MG tablet [id number] Yes TAKE 1 TABLET BY MOUTH ONCE DAILY IN AFTERNOON AS NEEDED LEG EDEMA [name], NP Taking Active   gabapentin (NEURONTIN) 300 MG capsule [id number] Yes TAKE 1 CAPSULE BY MOUTH 3 TIMES DAILY ASNEEDED (NEED OFFICE VISIT FOR FURTHER REFILLS) [name], NP Taking Active   hydrALAZINE (APRESOLINE) 25 MG tablet [id number] Yes TAKE 1 TABLET BY MOUTH 3 TIMES DAILY [name], NP Taking Active   levothyroxine (SYNTHROID) 50 MCG tablet [id number] Yes TAKE 1 TABLET BY MOUTH ONCE DAILY ON AN EMPTY STOMACH. WAIT 30 MINUTES BEFORE TAKING OTHER MEDS. [name], NP Taking Active   lisinopril (ZESTRIL) 40 MG tablet [id number] Yes TAKE 1 TABLET BY MOUTH ONCE DAILY [name], NP Taking Active   meloxicam (MOBIC) 15 MG tablet [id number] Yes TAKE 1 TABLET BY MOUTH ONCE DAILY AS NEEDED WITH FOOD OR MILK [name], NP Taking Active   methocarbamol (ROBAXIN) 500 MG tablet [id number] No Take 500 mg by mouth every 8 (eight) hours as needed for muscle spasms.  Patient not taking: Reported on 02/02/2022   [provider] Not Taking Consider Medication Status and Discontinue   mometasone-formoterol (DULERA) 100-5 MCG/ACT AERO [id number]  Yes INHALE 2 PUFFS BY MOUTH TWICE A DAY. RINSE MOUTH AFTER USE. [name], NP Taking Active   montelukast (SINGULAIR) 10 MG tablet [id number] Yes TAKE 1 TABLET BY MOUTH AT BEDTIME [name], NP Taking Active   nitroGLYCERIN (NITROSTAT) 0.4 MG SL tablet [id number] Yes DISSOLVE 1 TABLET UNDER TONGUE AT ONSET OF CHEST PAIN. REPEAT IN 5 MIN IF NOT RESOLVED, MAX 3 DOSES. 911 IF NEEDED. [name], NP Taking Active   omeprazole (PRILOSEC) 20 MG capsule [id number] Yes TAKE 1 CAPSULE BY MOUTH ONCE DAILY [name], NP Taking Active   SPIRIVA HANDIHALER 18 MCG inhalation capsule [id number] Yes INHALE CONTENTS OF 1 CAPSULE ONCE DAILY [name], NP Taking Active   spironolactone (ALDACTONE) 25 MG tablet [id number] Yes Take 0.5 tablets (12.5 mg total) by mouth daily. [name], NP Taking Active   triamcinolone cream (KENALOG) 0.1 % [id number] Yes Apply 1 application. topically 2 (two) times daily. [name], NP Taking Active   triamcinolone cream (KENALOG) 0.1 % [id number] No APPLY 1 APPLICATION TOPICALLY TWICE DAILY  Patient not taking: Reported on 02/02/2022   [name], NP Not Taking Consider Medication Status and Discontinue   TRULICITY 2.86 NO/1.7RN SOPN [id number] Yes INJECT 0.$RemoveBefo'75MG'[id number]$  SUBQ ONCE A [name], NP Taking Active   Vitamin D, Ergocalciferol, (DRISDOL) 1.25 MG (50000 UNIT) CAPS capsule [id number] Yes TAKE 1 CAPSULE BY MOUTH EVERY 7 DAYS [name], NP Taking Active  Patient Active Problem List   Diagnosis Date Noted   Foot pain, right 09/01/2021   Fibrocystic disease of both breasts 05/07/2019   IFG (impaired fasting glucose) 05/03/2019   Chronic venous insufficiency 11/09/2018   Overactive bladder 09/30/2018   Peripheral neuropathy 09/30/2018   Apnea 01/02/2016   Lymphedema 11/30/2014   Dyspnea on exertion 11/30/2014   Anxiety 11/29/2014   Arthritis 11/29/2014   COPD (chronic obstructive pulmonary  disease) ([place]) 11/29/2014   Clinical depression 11/29/2014   H/O eating disorder 11/29/2014   Esophageal reflux 11/29/2014   Acne inversa 11/29/2014   Essential hypertension 11/29/2014   HLD (hyperlipidemia) 11/29/2014   Adult hypothyroidism 11/29/2014   Insomnia 11/29/2014   Low back pain 11/29/2014   Morbid obesity ([place]) 11/29/2014   Posttraumatic stress disorder 11/29/2014   Vitamin D deficiency 11/29/2014   Nicotine dependence, cigarettes, uncomplicated 93/57/0177    Conditions to be addressed/monitored per PCP order:  Depression  Care Plan : General Social Work (Adult)  Updates made by [name], LCSW since 02/03/2022 12:00 AM     Problem: Coping Skills (General Plan of Care)      Long-Range Goal: Coping Skills Enhanced   Start Date: 11/07/2020  Expected End Date: 03/07/2022  Recent Progress: On track  Priority: High  Note:   Timeframe:  Long-Range Goal Priority:  High Start Date:       08/26/21                    Expected End Date:  ongoing   Follow up date: 02/23/22 at 2:30 pm   Current Barriers:  Financial constraints Mental Health Concerns  Social Isolation Limited education about mental health support resources that are available to her within the local area* Cognitive Deficits Lacks knowledge of community resource   Clinical Social Work Clinical Goal(s):  Over the next 90 days, client will work with SW to address concerns related to experiencing ongoing symptoms of depression, sadness and grief since the passing of her mother Over the next 90 days, patient will work with LCSW to address needs related to implementing appropriate self-care and depression management.    Interventions: Patient interviewed and appropriate assessments performed Provided mental health counseling with regards to patient's loss and ongoing stressors. Patient was very close with her mother who passed away a few years ago and she continues to experience symptoms of grief from  this loss. Patient denies wanting LCSW to make a referral for grief counseling through [place] again but was receptive to coping skill and resource education/information that was provided during session today. Patient reports decorating for the holidays helps her to connect with her mother as they did this together when she was alive.  Provided patient with information about managing depression within her daily life. Patient has ongoing grief symptoms as well. Patient states "I feel worn down." Patient reports that she talks out loud to her mother's picture and this is very helpful to her. Grief support resource education provided again during session today.  Provided reflective listening and implemented appropriate interventions to help support patient and her emotional needs  Discussed plans with patient for ongoing care management follow up and provided patient with direct contact information for [facility] team Advised patient to contact [facility] team with any urgent case management needs. Assisted patient/caregiver with obtaining information about health plan benefits LCSW completed past referral for family to have a wheelchair ramp built at their residence due to ongoing mobility concerns. Ramp has been successfully  installed.  PCS actively involved with patient and providing ongoing services.  Positive reinforcement provided to patient for quitting smoking cigarettes. Coping skill review education provided for future triggers.  Brief self-care education provided to both patient and spouse. Patient confirms that she continues to go to food pantry for food insecurity. Resource information has been provided.  Patient reports ongoing pain and issues with mobility. Patient shares that her pain resides in her pains and legs. Emotional support and coping skill education provided. Patient confirms that her PCS aide continues to provide her with services. However, aide has to be reminded to put on her mask since  patient is high risk. LCSW reviewed resources that [name] provided her with in the past.  Patient reports that she had a wonderful birthday spent with her family this past May. Patient reports receiving appropriate socialization. Patient reports that her spouse took her out to eat and gifted her with several things which was a nice surprise.  Patient confirms stable transportation arrangements for upcoming PCP appointment this week.  Patient repots that she is losing a lot of weight but is not sure how much she has lost as she does not have a scale. Patient reports that a goal of hers is to lose weight but she has not been intentionally losing weight at this time.  Patient reports that her aide [name] continues to assist her with PCS and is an excellent support within the home for her and spouse.  Patient planted a tomato plant and successfully grew over 5 tomatoes over the summer. Patient is very proud of this achievement as this was her first time planting a tomato plant and is now interested in picking up gardening/planting as a hobby. Patient reports that her aunt continues to be a positive support for her in her life and someone that she can rely on in the case of emergencies.  Patient was educated on healthy self-care skills and advised her to use these into her daily routine.  04/10/21- Update- Patient is agreeable to social work closure at this time as she still does not need medication management or counseling as she is managing her mental health well at this time. Patient talks to her younger sister everyday to gain emotional support. Patient says that her sister is into body building and helps to educate and remind her to do her own self-care. Patient is agreeable to contact [facility] LCSW directly if any social work concern arise. [facility] LCSW will complete case closure at this time.  Patient reports that she has been elevating her legs daily to elevate pain and swelling.  [facility] LCSW completed care  coordination with Thomasene Lot on 01/15/21 and [facility] LCSW provided the following suggested resource-It is likely their best option given their loss of housing and lack of consistent income:   Publishing rights manager                                                                                                                       (  Emergency Crisis Housing) [phone] Ext. 104 M-F, 8am - 4pm Patient was successfully approved for food stamps but is still experiencing financial concerns. She reports that she and her husband have no clothes that fit them and they are having to start using a string for their pants and patient only has one bra at this time. [facility] LCSW will send referral to [facility] BSW today for financial support.  BSW spoke with patient about resources needed. Patient states she does receive SSI and was approved for foodstamps. Patient is familiar with all of the food pantries and other resources in [place]. BSW will assist patient with finding an eye doctor. Patient states she has not been in 10 years. BSW will research and put a list of eye doctors together that accept Medicaid in [facility] 04/30/20: BSW contacted and spoke with patient, she stated she was not doing good because she found out that she has a new landlord and they are going up on her rent. BSW informed patient to make sure she informs her social workers at [employer] that they have an increase for foodstamps and Medicaid. Patient stated that she sleeps on the couch and is in need of a power recliner/lift chair. BSW will send patients PCP a message asking for a script to be sent. 05/21/21: BSW completed follow up telephone call with patient. She states she is unsure if their new landlord has received their January rent. Patient states she is also worried due to the new landlord making them pay rent online and on the 1st of the month. Patient states she is unable to get anyone on the phone. Patient states her rent  will go up to 400 from 255 in March. Patient states she does not have many clothes but goes to [employer] to get some Materials engineer. No other resources needed at this time.   06/20/21: BSW completed follow up telelphone call with patient. Patient states she is doing well. Patient states [employer] [place] will cover all of Vaccines. Patient states no resources are needed at this time. 08/26/21: BSW completed telephone outreach with patient, she stated since going to the hospital she has been feeling weak. Patient states other than that she is feeling okay. Patient stated she is going to get a new phone on Wednesday. Patient states her birthday is on Friday and her husband may take her to a [place]. No other resources are needed at this time.Update- Patient reports experiencing additional grief lately. [facility] LCSW offered referral for berievement counseling but she declined. UPDATE 08/26/21- Patient reports that she has a near death experience last month. Patient shares that she had a fall. 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 [facility] LCSW completed education on available mental health resources within her area to utilize. Patient admits to experiencing some sadness due to grief from her mother's passing. Emotional support provided. 10/13/21 UPDATE- Patient reports that she is working hard on living a healthier life both physically and mentally. Patient reports that she is making active changes in her diet. She reports that she eventually wishes to quit smoking again and has started picking up new hobbies to implement during times of craving such as coloring, crocheting and being creative. Patient reports that she is unsure if she needs mental health support at this time because her main concerns are centered around her and her spouse's physical health instead their mental health. [facility] LCSW educated patient on how they affect one another and their importance. Patient denies any urgent  concerns. She reports  no issues with affording food due to food stamp assistance. [facility] LCSW will reassess social work needs 30 days from now. UPDATE 11/17/21- Patient reports that she is experiencing several stressors at this time. She was encouraged to consider mental health treatment but she is not interested at this time. Patient was encouraged to consider mental health resources. Patient reports that their car is in the shop and that they have no stable transportation besides their sister and her husband's father. Patient reports that her spouse has had several recent health issues that have affected her mental state. Patient was educated on healthy coping skills for stress. Patient denies no urgent concerns or issues at this time. UPDATE- Care Coordination completed with [facility] LCSW, PCP office, Pharmacist, [facility] BSW and PCP. Patient has canceled several medical appointments because they are unable to afford a copay. Patient will benefit from financial assistance and may benefit from a [facility] application. Patient has an upcoming appointment with [facility] BSW next week. Patient is agreeable to exploring financial assistance options with [facility] BSW that will help alleviate other areas of financial stress within their life in order for patient to be able to afford medical expenses. [facility] LCSW encouraged patient to consider individual counseling. Patient is agreeable to referral for counseling but needs to find out her sister's email first as she is unable to go to office to fill out intake paperwork. Patient prefers virtual counseling to save money on transportation. [facility] LCSW will successfully place referral to [facility] during next outreach. [facility] LCSW completed care coordination with [facility] BSW.  BSW 12/15/21: BSW completed a telephone outreach with patient. She stated everything is still going okay. She has been having problems with her leg wraps and has an appointment today. She states her husband is still having issues with his eye and  with the doctor and she is going to make a compliant. Patient states no resources are needed at this time. [facility] LCSW 01/19/22: Patient reports that she was unable to sleep last night. She is experiencing a break out skin reaction that she feels is caused by stress. Her financial stressors have increased since last session. Patient is agreeable to [facility] LCSW placing referral for [facility] now but reports that she has no email address. [facility] LCSW completed referral and made note of her inability to get into her email. [facility] LCSW will follow up within two weeks to ensure that she was enrolled in [facility] program. [facility] completed care coordination call ONLY to [facility] and they were unable to locate patient's referral that was made on 01/19/22. [facility] LCSW resubmitted this referral today on 01/29/22 for telephonic counseling. Patient was made aware by telephone call that [facility] will contact her by the end of the day tomorrow to schedule a time for her to come into their office to complete in person intake paperwork. Patient is agreeable to this plan. UPDATE 02/03/22- Patient reports that she missed a call from June the receptionist at [facility] and call her back but has not heard back yet. [facility] LCSW provided patient with her number and directions on which prompt to enter to be directed to June. Patient will ask June about her phone as patient is unsure if she can make video calls and complete telephonic therapy which would mean she would need to go for in person sessions. Patient is agreeable to plan. Patient was able to find an assistance program for rent through [facility] on her own. Positive reinforcement provided for this resource find. [facility] LCSW collaborated with entire team. [facility] LCSW will follow up in two weeks to ensure that patient was able to get connected with either telephonic or in person therapy services at [facility].  Managing Loss, Adult People  experience loss in many different ways throughout their lives. Events such as moving, changing jobs, and losing friends can create a sense of loss. The loss may be as serious as a major health change, divorce, death of a pet, or death of a loved one. All of these types of loss are likely to create a physical and emotional reaction known as grief. Grief is the result of a major change or an absence of something or someone that you count on. Grief is a normal reaction to loss. A variety of factors can affect your grieving experience, including: The nature of your loss. Your relationship to what or whom you lost. Your understanding of grief and how to manage it. Your support system. How to manage lifestyle changes Keep to your normal routine as much as possible. If you have trouble focusing or doing normal activities, it is acceptable to take some time away from your normal routine. Spend time with friends and loved ones. Eat a healthy diet, get plenty of sleep, and rest when you feel tired. How to recognize changes  The way that you deal with your grief will affect your ability to function as you normally do. When grieving, you may experience these changes: Numbness, shock, sadness, anxiety, anger, denial, and guilt. Thoughts about death. Unexpected crying. A physical sensation of emptiness in your stomach. Problems sleeping and eating. Tiredness (fatigue). Loss of interest in normal activities. Dreaming about or imagining seeing the person who died. A need to remember what or whom you lost. Difficulty thinking about anything other than your loss for a period of time. Relief. If you have been expecting the loss for a while, you may feel a sense of relief when it happens. Follow these instructions at home:    Activity Express your feelings in healthy ways, such as: Talking with others about your loss. It may be helpful to find others who have had a similar loss, such as a support  group. Writing down your feelings in a journal. Doing physical activities to release stress and emotional energy. Doing creative activities like painting, sculpting, or playing or listening to music. Practicing resilience. This is the ability to recover and adjust after facing challenges. Reading some resources that encourage resilience may help you to learn ways to practice those behaviors. General instructions Be patient with yourself and others. Allow the grieving process to happen, and remember that grieving takes time. It is likely that you may never feel completely done with some grief. You may find a way to move on while still cherishing memories and feelings about your loss. Accepting your loss is a process. It can take months or longer to adjust. Keep all follow-up visits as told by your health care provider. This is important. Where to find support To get support for managing loss: Ask your health care provider for help and recommendations, such as grief counseling or therapy. Think about joining a support group for people who are managing a loss. Where to find more information You can find more information about managing loss from: American Society of Clinical Oncology: www.cancer.net American Psychological Association: TVStereos.ch Contact a health care provider if: Your grief is extreme and keeps getting worse.  You have ongoing grief that does not improve. Your body shows symptoms of grief, such as illness. You feel depressed, anxious, or lonely. Get help right away if: You have thoughts about hurting yourself or others. If you ever feel like you may hurt yourself or others, or have thoughts about taking your own life, get help right away. You can go to your nearest emergency department or call: Your local emergency services (911 in the [place] A suicide crisis helpline, such as the [place] at 8323720737. This is open 24 hours a  day. Summary Grief is the result of a major change or an absence of someone or something that you count on. Grief is a normal reaction to loss. The depth of grief and the period of recovery depend on the type of loss and your ability to adjust to the change and process your feelings. Processing grief requires patience and a willingness to accept your feelings and talk about your loss with people who are supportive. It is important to find resources that work for you and to realize that people experience grief differently. There is not one grieving process that works for everyone in the same way. Be aware that when grief becomes extreme, it can lead to more severe issues like isolation, depression, anxiety, or suicidal thoughts. Talk with your health care provider if you have any of these issues. This information is not intended to replace advice given to you by your health care provider. Make sure you discuss any questions you have with your health care provider. Document Revised: 06/17/2018 Document Reviewed: 08/27/2016 Elsevier Patient Education  [place] for Managing Grief   When you are experiencing grief, many resources and support are available to help you. You are not alone.    Grief Share ([email or web address]://jacobson-[email or web address]/):    [employer] [facility] [place], [place]  [phone] [address], [place], [place], [place], [place]   [facility] [address] [place], [place]   [place], [place]   If a Hospice or [place] team has been involved in the care of your loved one, you may reach out to your contact there or  locally, you may reach out to [facility]:    Hospice and [place] [facility]   [place], [place] [address] [phone] Patient was educated on healthy self-care as she admits she has little motivation to walk lately. PCS recommendation was suggested.   Patient Self Care Activities:  Attends all scheduled provider appointments Calls provider office for new concerns or questions  Please see past updates related to this goal by clicking on the "Past Updates" button in the selected goal        Follow up:  Patient agrees to Care Plan and Follow-up.  Plan: The Managed Medicaid care management team will reach out to the patient again over the next 30 days.  Date/time of next scheduled Social Work care management/care coordination outreach:  02/23/22 at 230 pm  Eula Fried, BSW, MSW, [employer] Managed Medicaid LCSW  [email or web address] Phone: [phone]

## 2022-02-03 NOTE — Patient Instructions (Signed)
Visit Information  Ms. Faron was given information about Medicaid Managed Care team care coordination services as a part of their [facility] Medicaid benefit. Yenni Tenlee Wollin verbally consented to engagement with the [facility] Managed Care team.   If you are experiencing a medical emergency, please call 911 or report to your local emergency department or urgent care.   If you have a non-emergency medical problem during routine business hours, please contact your provider's office and ask to speak with a nurse.   For questions related to your [facility] health plan, please call: [phone] or go here:[email or web address]  If you would like to schedule transportation through your [employer] plan, please call the following number at least 2 days in advance of your appointment: [phone].  You can also use the MTM portal or MTM mobile app to manage your rides. For the portal, please go to [email or web address]  Call the [facility] Crisis Line at [phone], at any time, 24 hours a day, 7 days a week. If you are in danger or need immediate medical attention call 911.  If you would like help to quit smoking, call 1-800-QUIT-NOW ((972)668-5990) OR Espaol: 1-855-Djelo-Ya (2-229-798-9211) o para ms informacin haga clic aqu or Text READY to 941-740 to register via text   copy of your plan of care:  Care Plan : General Social Work (Adult)  Updates made by Gustavus Bryant, LCSW since 02/03/2022 12:00 AM     Problem: Coping Skills (General Plan of Care)      Long-Range Goal: Coping Skills Enhanced   Start Date: 11/07/2020  Expected End Date: 03/07/2022  Recent Progress: On track  Priority: High  Note:   Timeframe:  Long-Range Goal Priority:  High Start Date:       08/26/21                    Expected End Date:  ongoing   Follow up date: 02/23/22 at 2:30 pm   Current Barriers:  Financial constraints Mental Health Concerns  Social Isolation Limited  education about mental health support resources that are available to her within the local area* Cognitive Deficits Lacks knowledge of community resource   Clinical Social Work Clinical Goal(s):  Over the next 90 days, client will work with SW to address concerns related to experiencing ongoing symptoms of depression, sadness and grief since the passing of her mother Over the next 90 days, patient will work with LCSW to address needs related to implementing appropriate self-care and depression management.    Managing Loss, Adult People experience loss in many different ways throughout their lives. Events such as moving, changing jobs, and losing friends can create a sense of loss. The loss may be as serious as a major health change, divorce, death of a pet, or death of a loved one. All of these types of loss are likely to create a physical and emotional reaction known as grief. Grief is the result of a major change or an absence of something or someone that you count on. Grief is a normal reaction to loss. A variety of factors can affect your grieving experience, including: The nature of your loss. Your relationship to what or whom you lost. Your understanding of grief and how to manage it. Your support system. How to manage lifestyle changes Keep to your normal routine as much as possible. If you have trouble focusing or doing normal activities, it is acceptable to take some time away from your normal routine. Spend time  with friends and loved ones. Eat a healthy diet, get plenty of sleep, and rest when you feel tired. How to recognize changes  The way that you deal with your grief will affect your ability to function as you normally do. When grieving, you may experience these changes: Numbness, shock, sadness, anxiety, anger, denial, and guilt. Thoughts about death. Unexpected crying. A physical sensation of emptiness in your stomach. Problems sleeping and eating. Tiredness  (fatigue). Loss of interest in normal activities. Dreaming about or imagining seeing the person who died. A need to remember what or whom you lost. Difficulty thinking about anything other than your loss for a period of time. Relief. If you have been expecting the loss for a while, you may feel a sense of relief when it happens. Follow these instructions at home:    Activity Express your feelings in healthy ways, such as: Talking with others about your loss. It may be helpful to find others who have had a similar loss, such as a support group. Writing down your feelings in a journal. Doing physical activities to release stress and emotional energy. Doing creative activities like painting, sculpting, or playing or listening to music. Practicing resilience. This is the ability to recover and adjust after facing challenges. Reading some resources that encourage resilience may help you to learn ways to practice those behaviors. General instructions Be patient with yourself and others. Allow the grieving process to happen, and remember that grieving takes time. It is likely that you may never feel completely done with some grief. You may find a way to move on while still cherishing memories and feelings about your loss. Accepting your loss is a process. It can take months or longer to adjust. Keep all follow-up visits as told by your health care provider. This is important. Where to find support To get support for managing loss: Ask your health care provider for help and recommendations, such as grief counseling or therapy. Think about joining a support group for people who are managing a loss. Where to find more information You can find more information about managing loss from: American Society of Clinical Oncology: www.cancer.net American Psychological Association: TVStereos.ch Contact a health care provider if: Your grief is extreme and keeps getting worse. You have ongoing grief that  does not improve. Your body shows symptoms of grief, such as illness. You feel depressed, anxious, or lonely. Get help right away if: You have thoughts about hurting yourself or others. If you ever feel like you may hurt yourself or others, or have thoughts about taking your own life, get help right away. You can go to your nearest emergency department or call: Your local emergency services (911 in the U.S.). A suicide crisis helpline, such as the [place] at (819) 028-4546. This is open 24 hours a day. Summary Grief is the result of a major change or an absence of someone or something that you count on. Grief is a normal reaction to loss. The depth of grief and the period of recovery depend on the type of loss and your ability to adjust to the change and process your feelings. Processing grief requires patience and a willingness to accept your feelings and talk about your loss with people who are supportive. It is important to find resources that work for you and to realize that people experience grief differently. There is not one grieving process that works for everyone in the same way. Be aware that when grief becomes extreme, it  can lead to more severe issues like isolation, depression, anxiety, or suicidal thoughts. Talk with your health care provider if you have any of these issues. This information is not intended to replace advice given to you by your health care provider. Make sure you discuss any questions you have with your health care provider. Document Revised: 06/17/2018 Document Reviewed: 08/27/2016 Elsevier Patient Education  2020 [employer].  Help for Managing Grief   When you are experiencing grief, many resources and support are available to help you. You are not alone.    Grief Share (https://www.SunglassSpecialist.gl):    [employer] of [address] [place], [place]      [employer]  [phone] [address] [place],  [place]     [facility] [address] [place], [place]      [facility] [address] [place], [place]  [phone]   [facility] [address] [place], [place] [phone]   [facility] [address] [place], [place]     [phone]   [facility] [address] [place], [place]     [phone]   If a Hospice or Palliative Care team has been involved in the care of your loved one, you may reach out to your contact there or locally, you may reach out to [facility]:    Hospice and Palliative Care of [facility]   [address] [place], [place] [address] [phone] Patient was educated on healthy self-care as she admits she has little motivation to walk lately. PCS recommendation was suggested.   Patient Self Care Activities:  Attends all scheduled provider appointments Calls provider office for new concerns or questions  Please see past updates related to this goal by clicking on the "Past Updates" button in the selected goal

## 2022-02-03 NOTE — Telephone Encounter (Signed)
I have not finished my note however the medication list is there.  She has been on [place] and is failing these.  Advair is essentially Dulera and I do not think it is going to be helpful.

## 2022-02-03 NOTE — Telephone Encounter (Signed)
Received a fax regarding Prior Authorization from [facility] for [place] 100-62.5-25MCG/ACT.  Authorization has been DENIED because  do not show that you  have tried at least 2 preferred drugs. You have already tried [place]. Other covered drug(s) is/are: Advair  HFA inhaler, Advair Diskus, Symbicort Inhaler. Note: Some covered drug(s) may have limits on the quantity  you can get. You can get this information on the list of covered drugs (Preferred Drug List) for details.  Key: [id number]

## 2022-02-03 NOTE — Telephone Encounter (Signed)
Received notification that PA is required for TRELEGY 100-62.5-25MCG/ACT.  PA submitted through CoverMyMeds.  Awaiting determination.   Key: [id number]

## 2022-02-03 NOTE — Telephone Encounter (Signed)
Received notification from [facility] regarding a prior authorization for TRELEGY. Authorization has been APPROVED from 01/20/2022 to 02/03/2023.   Per test claim, copay for 30 days supply is $0.00  Key: [id number]

## 2022-02-08 DIAGNOSIS — Z993 Dependence on wheelchair: Secondary | ICD-10-CM | POA: Diagnosis not present

## 2022-02-08 DIAGNOSIS — R3981 Functional urinary incontinence: Secondary | ICD-10-CM | POA: Diagnosis not present

## 2022-02-09 ENCOUNTER — Ambulatory Visit (INDEPENDENT_AMBULATORY_CARE_PROVIDER_SITE_OTHER): Payer: Medicaid Other | Admitting: Vascular Surgery

## 2022-02-09 ENCOUNTER — Telehealth: Payer: Self-pay | Admitting: Pulmonary Disease

## 2022-02-09 NOTE — Telephone Encounter (Signed)
CM sent to [facility] asking about a PA for ONO

## 2022-02-09 NOTE — Telephone Encounter (Signed)
Routing to [place] for review.

## 2022-02-11 NOTE — Telephone Encounter (Signed)
Received message from [place] with [employer] The ONO has already been completed. [employer] will take care of any issues with prior authorization if any is needed for the testing.

## 2022-02-16 ENCOUNTER — Telehealth: Payer: Self-pay | Admitting: Pulmonary Disease

## 2022-02-16 DIAGNOSIS — G4734 Idiopathic sleep related nonobstructive alveolar hypoventilation: Secondary | ICD-10-CM

## 2022-02-16 NOTE — Telephone Encounter (Signed)
Dr. Patsey Berthold has reviewed the patient's ONO results. She said the patient has significant O2 desaturation with sleep. She needs a split night sleep study. If not willing to have sleep study will need O2 but I do not think O2 alone with help.  I notified the patient. She said her husband is having a lot of health issues right now and she will not be able to do the sleep study. She is ok doing the oxygen at night. I told her we will get it ordered and they will reach out to her. I have ordered the O2 and another ONO once she has received the O2.

## 2022-02-17 ENCOUNTER — Other Ambulatory Visit: Payer: Self-pay | Admitting: Obstetrics and Gynecology

## 2022-02-17 DIAGNOSIS — I10 Essential (primary) hypertension: Secondary | ICD-10-CM

## 2022-02-17 NOTE — Patient Instructions (Signed)
Visit Information  [name] was given information about Medicaid Managed Care team care coordination services as a part of their [facility] Medicaid benefit. Cheryl Chandler verbally consented to engagement with the [facility] Managed Care team.   If you are experiencing a medical emergency, please call 911 or report to your local emergency department or urgent care.   If you have a non-emergency medical problem during routine business hours, please contact your provider's office and ask to speak with a nurse.   For questions related to your [facility] health plan, please call: [phone] or go here:[email or web address]  If you would like to schedule transportation through your [facility] plan, please call the following number at least 2 days in advance of your appointment: [phone].  You can also use the MTM portal or MTM mobile app to manage your rides. For the portal, please go to [email or web address].  Call the [facility] at [phone], at any time, 24 hours a day, 7 days a week. If you are in danger or need immediate medical attention call 911.  If you would like help to quit smoking, call 1-800-QUIT-NOW 904-865-0918) OR Espaol: 1-855-Djelo-Ya (7-494-496-7591) o para ms informacin haga clic aqu or Text READY to 200-400 to register via text  Cheryl Chandler - following are the goals we discussed in your visit today:   Goals Addressed             This Visit's Progress    RNCM: Keep Skin Clean and Dry       Follow Up Date: 03/18/22  - clean and dry skin well - keep skin dry - use a fragrance-free lotion on skin - wear a protective pad or garment    Why is this important?   Leaking urine (pee) can cause soreness from skin rashes and redness.  This is caused by skin being exposed to urine (pee).    12/05/21:  no complaints today    RNCM: Track and Manage My Symptoms-Depression       Timeframe:  Long-Range Goal Priority:   High Start Date:    12-02-2020                         Expected End Date:   ongoing                Follow Up Date: 02/17/22   - avoid negative self-talk - develop a personal safety plan - develop a plan to deal with triggers like holidays, anniversaries - exercise at least 2 to 3 times per week - have a plan for how to handle bad days - journal feelings and what helps to feel better or worse - spend time or talk with others at least 2 to 3 times per week - spend time or talk with others every day - watch for early signs of feeling worse    Why is this important?   Keeping track of your progress will help your treatment team find the right mix of medicine and therapy for you.  Write in your journal every day.  Day-to-day changes in depression symptoms are normal. It may be more helpful to check your progress at the end of each week instead of every day.    02/17/22:  Patient has not scheduled appt with [facility] yet, followed by BSW and LCSW    RNCM: Track and Manage My Triggers-COPD       Timeframe:  Long-Range Goal Priority:  Medium  Start Date:     05-22-2020                        Expected End Date:   ongoing          Follow Up Date: 03/18/22   - avoid second hand smoke - eliminate smoking in my home - identify and avoid work-related triggers - identify and remove indoor air pollutants - limit outdoor activity during cold weather - listen for public air quality announcements every day    Why is this important?   Triggers are activities or things, like tobacco smoke or cold weather, that make your COPD (chronic obstructive pulmonary disease) flare-up.  Knowing these triggers helps you plan how to stay away from them.  When you cannot remove them, you can learn how to manage them.   02/17/22:  Patient seen and evaluated by PULM 10/9, to get CPAP   The patient verbalized understanding of instructions, educational materials, and care plan provided today and DECLINED offer  to receive copy of patient instructions, educational materials, and care plan.   The Managed Medicaid care management team will reach out to the patient again over the next 30 business  days.  The  Patient has been provided with contact information for the Managed Medicaid care management team and has been advised to call with any health related questions or concerns.   Kathi Der RN, BSN [place]  [facility] Care Management Coordinator - Managed Medicaid High Risk [phone]    Following is a copy of your plan of care:  Care Plan : RNCM: COPD (Adult)  Updates made by Cheryl Chandler, RN since 02/17/2022 12:00 AM     Problem: RNCM: Psychological Adjustment to Diagnosis (COPD)   Priority: Medium  Onset Date: 05/22/2020     Long-Range Goal: RNCM: Adjustment to Disease Achieved   Start Date: 05/22/2020  Expected End Date: 05/20/2022  Recent Progress: On track  Priority: High  Note:   Current Barriers:  Knowledge deficits related to basic understanding of COPD disease process Knowledge deficits related to basic COPD self care/management Knowledge deficit related to basic understanding of how to use inhalers and how inhaled medications work Knowledge deficit related to importance of energy conservation Limited Social Support Unable to independently manage COPD Lacks social connections Does not contact provider office for questions/concerns  02/17/22:  Patient recently seen by Pulmonologist, to get CPAP Case Manager Clinical Goal(s): patient will report using inhalers as prescribed including rinsing mouth after use patient will be able to verbalize understanding of COPD action plan and when to seek appropriate levels of medical care  patient will engage in lite exercise as tolerated to build/regain stamina and strength and reduce shortness of breath through activity tolerance  patient will verbalize basic understanding of COPD disease process and self care  activities  Interventions:  Collaboration with [name], NP regarding development and update of comprehensive plan of care as evidenced by provider attestation and co-signature Inter-disciplinary care team collaboration (see longitudinal plan of care) UNABLE to independently: manage COPD Provided patient with basic written and verbal COPD education on self care/management/and exacerbation prevention Provided patient with COPD action plan and reinforced importance of daily self assessment.  Provided written and verbal instructions on pursed lip breathing and utilized returned demonstration as teach back Provided instruction about proper use of medications used for management of COPD including inhalers Advised patient to self assesses COPD action plan zone and make  appointment with provider if in the yellow zone for 48 hours without improvement. Provided patient with education about the role of exercise in the management of COPD Advised patient to engage in light exercise as tolerated 3-5 days a week Provided education about and advised patient to utilize infection prevention strategies to reduce risk of respiratory infection. Patient Goals/Self-Care Activities:  - decision-making supported - depression screen reviewed - emotional support provided - family involvement promoted - problem-solving facilitated - relaxation techniques promoted - verbalization of feelings encouraged Follow Up Plan: Telephone follow up appointment with care management team member scheduled.   Problem: RNCM: Symptom Exacerbation (COPD)   Priority: High  Onset Date: 05/22/2020     Long-Range Goal: RNCM: Smoking Cessation Symptom Exacerbation Prevented or Minimized   Start Date: 05/22/2020  Expected End Date: 05/20/2022  Recent Progress: Not on track  Priority: High  Note:   Current Barriers:  Unable to independently stop smoking  Does not adhere to provider recommendations re: smoking cessation  Lacks  social connections Does not contact provider office for questions/concerns Tobacco abuse of >40 years; trying to stop-says she has stopped Previous quit attempts, unsuccessful several successful using patches, gum- has a pattern of stopping and then starting back  Reports smoking within 30 minutes of waking up Reports triggers to smoke include: stress, chronic conditions  Reports motivation to quit smoking includes: knows it will help her breathing and other chronic conditions  On a scale of 1-10, reports MOTIVATION to quit is 8 On a scale of 1-10, reports CONFIDENCE in quitting is 7 02/17/22:  Continues to smoke 2-3 cigarettes a day, trying to stop with activities Clinical Goal(s):  patient will work with RN Case Solicitor and provider towards tobacco cessation  Interventions: Collaboration with [name], NP regarding development and update of comprehensive plan of care as evidenced by provider attestation and co-signature Inter-disciplinary care team collaboration (see longitudinal plan of care) Evaluation of current treatment plan reviewed.  Provided contact information for [place] [facility]uit Line (1-800-QUIT-NOW). Patient will outreach this group for support. Update 01/22/21:  Patient states she has utilized [employer] for resources. Discussed plans with patient for ongoing care management follow up and provided patient with direct contact information for care management team Provided patient with printed smoking cessation educational materials Provided contact information for [place] [facility]uit [facility] (1-800-QUIT-NOW). Patient will outreach this group for support. Praised patient for decreasing smoking- continued encouragement given. Patient Goals/Self-Care Activities patient will:  - mindfulness, talking to family and friends as a habit during cravings  - verbally commit to reducing tobacco consumption - barriers to lifestyle changes reviewed and addressed - barriers  to treatment reviewed and addressed - breathing techniques encouraged - modification of home and work environment promoted - rescue (action) plan reviewed - signs/symptoms of infection reviewed - signs/symptoms of worsening disease assessed - treatment plan reviewed Follow Up Plan: Telephone follow up appointment with care management team member scheduled.    Care Plan : RNCM: Hypertension (Adult)  Updates made by Cheryl Chandler, RN since 02/17/2022 12:00 AM     Problem: RNCM: Hypertension (Hypertension)   Priority: Medium  Onset Date: 05/22/2020     Long-Range Goal: RNCM: Hypertension Monitored   Start Date: 05/22/2020  Expected End Date: 05/20/2022  Recent Progress: On track  Priority: Medium  Note:    Current Barriers:  Knowledge Deficits related to basic understanding of hypertension pathophysiology and self care management Knowledge Deficits related to understanding of medications  prescribed for management of hypertension Limited Social Support Unable to independently manage HTN Lacks social connections Does not contact provider office for questions/concern 02/17/22:  BP 165/60 per patient-has f/u appts scheduled. Case Manager Clinical Goal(s):   patient will verbalize understanding of plan for hypertension management patient will demonstrate improved adherence to prescribed treatment plan for hypertension as evidenced by taking all medications as prescribed, monitoring and recording blood pressure as directed, adhering to low sodium/DASH diet patient will demonstrate improved health management independence as evidenced by checking blood pressure as directed and notifying PCP if SBP>160 or DBP > 90, taking all medications as prescribe, and adhering to a low sodium diet as discussed. Interventions:  Collaboration with Larae GroomsHoldsworth, Karen, NP regarding development and update of comprehensive plan of care as evidenced by provider attestation and co-signature Inter-disciplinary  care team collaboration (see longitudinal plan of care) Evaluation of current treatment plan related to hypertension self management and patient's adherence to plan as established by provider.  Provided education to patient re: stroke prevention, s/s of heart attack and stroke, DASH diet, complications of uncontrolled blood pressure Reviewed medications with patient and discussed importance of compliance.  Discussed plans with patient for ongoing care management follow up and provided patient with direct contact information for care management team Advised patient, providing education and rationale, to monitor blood pressure daily and record, calling PCP for findings outside established parameters.  Patient Goals/Self-Care Activities Over the next 120 days, patient will:  - Self administers medications as prescribed Attends all scheduled provider appointments Calls provider office for new concerns, questions, or BP outside discussed parameters Checks BP and records as discussed Follows a low sodium diet/DASH diet - blood pressure trends reviewed - depression screen reviewed - home or ambulatory blood pressure monitoring encouraged Follow Up Plan: Telephone follow up appointment with care management team member scheduled.     Care Plan : RNCM: Depression (Adult)  Updates made by Cheryl Chandlerraft, Dillyn Joaquin G, RN since 02/17/2022 12:00 AM     Problem: RNCM: Symptoms (Depression)   Priority: High  Onset Date: 12/02/2020     Long-Range Goal: RNCM: Symptoms Monitored and Managed   Start Date: 12/02/2020  Expected End Date: 05/21/2023  Recent Progress: Not on track  Priority: High  Note:   Current Barriers:  Ineffective Self Health Maintenance in a patient with Anxiety and Depression Unable to independently manage depression and anxiety as evidence of multiple chronic conditions and the patient feeling her health is out of control  Does not attend all scheduled provider appointments Lacks social  connections Unable to perform IADLs independently Does not contact provider office for questions/concerns 02/17/22:  Patient has not scheduled appt with [facility] yet, LCSW and BSW continue to follow Clinical Goal(s):  Collaboration with Larae GroomsHoldsworth, Karen, NP regarding development and update of comprehensive plan of care as evidenced by provider attestation and co-signature Inter-disciplinary care team collaboration (see longitudinal plan of care) patient will work with care management team to address care coordination and chronic disease management needs related to Disease Management Educational Needs Care Coordination Mental Health Counseling Level of Care Concerns   Interventions:  Evaluation of current treatment plan related to Anxiety and Depression, Financial constraints related to insurance not approving care for the patient to go to wound care , Limited social support, Level of care concerns, ADL IADL limitations, Mental Health Concerns , Family and relationship dysfunction, Substance abuse issues -  smoker , and Inability to perform IADL's independently self-management and patient's adherence to plan as  established by provider. Collaboration with [name], NP regarding development and update of comprehensive plan of care as evidenced by provider attestation       and co-signature Inter-disciplinary care team collaboration (see longitudinal plan of care) Discussed plans with patient for ongoing care management follow up and provided patient with direct contact information for care management team Evaluation of depression and anxiety.  LCSW referral for increased stress-completed Collaborated with LCSW  Self Care Activities:  Patient verbalizes understanding of plan to effectively manage depression and anxiety Self administers medications as prescribed Attends all scheduled provider appointments Calls pharmacy for medication refills Attends church or other social  activities Performs ADL's independently Performs IADL's independently Calls provider office for new concerns or questions Work with the [facility] team to meet needs for continued CCM services  Patient Goals: - activity or exercise based on tolerance encouraged - depression screen reviewed - emotional support provided - healthy lifestyle promoted - medication side effects monitored and managed - pain managed - participation in mental health treatment encouraged - quality of sleep assessed - response to mental health treatment monitored - response to pharmacologic therapy monitored - sleep hygiene techniques encouraged - social activities and relationships encouraged - substance use assessed Follow Up Plan: The care management team will reach out to the patient again over the next 30 business days.

## 2022-02-17 NOTE — Patient Outreach (Signed)
Medicaid Managed Care   Nurse Care Manager Note  02/17/2022 Name:  Cheryl Chandler MRN:  [id number] DOB:  1965/06/19  Cheryl Chandler is an 56 y.o. year old female who is a primary patient of [name], NP.  The [facility] Managed Care Coordination team was consulted for assistance with:    Chronic case management follow up, HTN, DM, tobacco use, COPD, anxiety/depression/PTSD, arthritis, LBP, lymphedema, GERD, hypothyroidism  Cheryl Chandler was given information about Medicaid Managed Care Coordination team services today. [place] Patient agreed to services and verbal consent obtained.  Engaged with patient by telephone for follow up visit in response to provider referral for case management and/or care coordination services.   Assessments/Interventions:  Review of past medical history, allergies, medications, health status, including review of consultants reports, laboratory and other test data, was performed as part of comprehensive evaluation and provision of chronic care management services.  SDOH (Social Determinants of Health) assessments and interventions performed: [name]    Flowsheet Row Patient Outreach Telephone from 02/17/2022 in [place] Patient Outreach Telephone from 01/19/2022 in [place] Patient Outreach from 01/01/2022 in [place] Patient Outreach Telephone from 12/11/2021 in [place] Patient Outreach Telephone from 12/05/2021 in [place] Patient Outreach Telephone from 11/17/2021 in [place] Coordination  [name]        Food Insecurity Interventions -- -- -- -- Other (Comment)  [has resources] --  Transportation Interventions -- -- Patient Resources Tax adviser) -- -- --  Utilities  Interventions Intervention Not Indicated -- -- -- -- --  Financial Strain Interventions -- Intervention Not Indicated  [facility] BSW] Other (Comment)  [MM care team] -- -- --  Physical Activity Interventions Other (Comments)  [not physically able to engage in moderate to strenuous exercise] -- -- -- -- --  Stress Interventions -- [employer], Provide Counseling Provide Counseling Offered [employer], Provide Counseling -- Live Life Well, Futures trader Wellness Resources      Care Plan  Allergies  Allergen Reactions   Codeine Shortness Of Breath and Rash   Percocet [Oxycodone-Acetaminophen] Shortness Of Breath   Sulfa Antibiotics Shortness Of Breath and Rash   Aspirin Hives   Bee Venom     Bee stings   Darvon [Propoxyphene]     Darvocet-N 100   Latex    Oxycodone Nausea And Vomiting   Penicillins     Has patient had a PCN reaction causing immediate rash, facial/tongue/throat swelling, SOB or lightheadedness with hypotension: Unknown Has patient had a PCN reaction causing severe rash involving mucus membranes or skin necrosis: Unknown Has patient had a PCN reaction that required hospitalization: Unknown Has patient had a PCN reaction occurring within the last 10 years: Unknown If all of the above answers are "NO", then may proceed with Cephalosporin use.   Medications Reviewed Today     Reviewed by [name], RN (Registered Nurse) on 02/17/22 at 1251  Med List Status: <None>   Medication Order Taking? Sig Documenting Provider Last Dose Status Informant  ACCU-CHEK GUIDE test strip [id number] No USE TO CHECK BLOOD SUGAR 3 TIMES DAILY AS DIRECTED [name], NP Taking Active   Accu-Chek Softclix Lancets lancets [id number] No CHECK BLOOD SUGAR TWICE DAILY [name], NP Taking Active   albuterol (PROVENTIL) (2.5 MG/3ML) 0.083% nebulizer solution [id number] No USE 1 VIAL VIA NEBULIZER EVERY  4 HOURS AS NEEDED [name], NP  Taking Active   amLODipine (NORVASC) 5 MG tablet [id number] No TAKE 1 TABLET BY MOUTH ONCE DAILY [name], NP Taking Active   atorvastatin (LIPITOR) 80 MG tablet [id number] No TAKE 1 TABLET BY MOUTH ONCE EVERY [name], NP Taking Active   Blood Glucose Monitoring Suppl (ACCU-CHEK GUIDE) w/Device KIT [id number] No Use to check blood sugar 3 times a day. [name], NP Taking Active   Blood Pressure Monitoring (OMRON 3 SERIES BP MONITOR) DEVI [id number] No Use to check blood pressure as directed [name], NP Taking Active   citalopram (CELEXA) 40 MG tablet [id number] No TAKE 1 TABLET BY MOUTH ONCE DAILY [name], NP Taking Active   EPINEPHrine 0.3 mg/0.3 mL IJ SOAJ injection [id number] No INJECT 1 SYRINGE INTO OUTER THIGH ONCE AS NEEDED FOR SEVERE ALLERGIC REACTION. [name], NP Taking Active   Fluticasone-Umeclidin-Vilant (TRELEGY ELLIPTA) 100-62.5-25 MCG/ACT AEPB [id number]  Inhale 1 puff into the lungs daily. [name], MD  Active   Fluticasone-Umeclidin-Vilant [facility] ELLIPTA) 100-62.5-25 MCG/ACT AEPB [id number]  Inhale 1 puff into the lungs daily. [name], MD  Active   furosemide (LASIX) 20 MG tablet [id number] No TAKE 1 TABLET BY MOUTH ONCE DAILY IN AFTERNOON AS NEEDED LEG EDEMA [name], NP Taking Active   gabapentin (NEURONTIN) 300 MG capsule [id number] No TAKE 1 CAPSULE BY MOUTH 3 TIMES DAILY ASNEEDED (NEED OFFICE VISIT FOR FURTHER REFILLS) [name], NP Taking Active   hydrALAZINE (APRESOLINE) 25 MG tablet [id number] No TAKE 1 TABLET BY MOUTH 3 TIMES DAILY [name], NP Taking Active   levothyroxine (SYNTHROID) 50 MCG tablet [id number] No TAKE 1 TABLET BY MOUTH ONCE DAILY ON AN EMPTY STOMACH. WAIT 30 MINUTES BEFORE TAKING OTHER MEDS. [name], NP Taking Active   lisinopril (ZESTRIL) 40 MG tablet [id number] No TAKE 1 TABLET BY MOUTH ONCE DAILY [name], NP Taking Active   meloxicam  (MOBIC) 15 MG tablet [id number] No TAKE 1 TABLET BY MOUTH ONCE DAILY AS NEEDED WITH FOOD OR MILK [name], NP Taking Active   methocarbamol (ROBAXIN) 500 MG tablet [id number] No Take 500 mg by mouth every 8 (eight) hours as needed for muscle spasms.  Patient not taking: Reported on 02/02/2022   [provider] Not Taking Active   montelukast (SINGULAIR) 10 MG tablet [id number] No TAKE 1 TABLET BY MOUTH AT BEDTIME [name], NP Taking Active   nitroGLYCERIN (NITROSTAT) 0.4 MG SL tablet [id number] No DISSOLVE 1 TABLET UNDER TONGUE AT ONSET OF CHEST PAIN. REPEAT IN 5 MIN IF NOT RESOLVED, MAX 3 DOSES. 911 IF NEEDED. [name], NP Taking Active   omeprazole (PRILOSEC) 20 MG capsule [id number] No TAKE 1 CAPSULE BY MOUTH ONCE DAILY [name], NP Taking Active   spironolactone (ALDACTONE) 25 MG tablet [id number] No Take 0.5 tablets (12.5 mg total) by mouth daily. [name], NP Taking Active   triamcinolone cream (KENALOG) 0.1 % [id number] No Apply 1 application. topically 2 (two) times daily. [name], NP Taking Active   triamcinolone cream (KENALOG) 0.1 % [id number] No APPLY 1 APPLICATION TOPICALLY TWICE DAILY  Patient not taking: Reported on 02/02/2022   [name], NP Not Taking Active   TRULICITY 6.75 FF/6.3WG SOPN [id number] No INJECT 0.$RemoveBef'75MG'eEMBAuATLh$  SUBQ ONCE A [name], NP Taking Active   Vitamin D, Ergocalciferol, (DRISDOL) 1.25 MG (50000 UNIT) CAPS capsule [id number] No TAKE 1 CAPSULE BY MOUTH EVERY 7 DAYS [name], NP Taking Active  Patient Active Problem List   Diagnosis Date Noted   Foot pain, right 09/01/2021   Fibrocystic disease of both breasts 05/07/2019   IFG (impaired fasting glucose) 05/03/2019   Chronic venous insufficiency 11/09/2018   Overactive bladder 09/30/2018   Peripheral neuropathy 09/30/2018   Apnea 01/02/2016   Lymphedema 11/30/2014   Dyspnea on exertion 11/30/2014   Anxiety 11/29/2014    Arthritis 11/29/2014   COPD (chronic obstructive pulmonary disease) ([place]) 11/29/2014   Clinical depression 11/29/2014   H/O eating disorder 11/29/2014   Esophageal reflux 11/29/2014   Acne inversa 11/29/2014   Essential hypertension 11/29/2014   HLD (hyperlipidemia) 11/29/2014   Adult hypothyroidism 11/29/2014   Insomnia 11/29/2014   Low back pain 11/29/2014   Morbid obesity ([place]) 11/29/2014   Posttraumatic stress disorder 11/29/2014   Vitamin D deficiency 11/29/2014   Nicotine dependence, cigarettes, uncomplicated 95/18/8416   Conditions to be addressed/monitored per PCP order:  Chronic case management follow up, HTN, DM, tobacco use, COPD, anxiety/depression/PTSD, arthritis, LBP, lymphedema, GERD, hypothyroidism  Care Plan : RNCM: COPD (Adult)  Updates made by [name], RN since 02/17/2022 12:00 AM     Problem: RNCM: Psychological Adjustment to Diagnosis (COPD)   Priority: Medium  Onset Date: 05/22/2020     Long-Range Goal: RNCM: Adjustment to Disease Achieved   Start Date: 05/22/2020  Expected End Date: 05/20/2022  Recent Progress: On track  Priority: High  Note:   Current Barriers:  Knowledge deficits related to basic understanding of COPD disease process Knowledge deficits related to basic COPD self care/management Knowledge deficit related to basic understanding of how to use inhalers and how inhaled medications work Knowledge deficit related to importance of energy conservation Limited Social Support Unable to independently manage COPD Lacks social connections Does not contact provider office for questions/concerns  02/17/22:  Patient recently seen by Pulmonologist, to get CPAP Case Manager Clinical Goal(s): patient will report using inhalers as prescribed including rinsing mouth after use patient will be able to verbalize understanding of COPD action plan and when to seek appropriate levels of medical care  patient will engage in lite exercise as tolerated  to build/regain stamina and strength and reduce shortness of breath through activity tolerance  patient will verbalize basic understanding of COPD disease process and self care activities  Interventions:  Collaboration with [name], NP regarding development and update of comprehensive plan of care as evidenced by provider attestation and co-signature Inter-disciplinary care team collaboration (see longitudinal plan of care) UNABLE to independently: manage COPD Provided patient with basic written and verbal COPD education on self care/management/and exacerbation prevention Provided patient with COPD action plan and reinforced importance of daily self assessment.  Provided written and verbal instructions on pursed lip breathing and utilized returned demonstration as teach back Provided instruction about proper use of medications used for management of COPD including inhalers Advised patient to self assesses COPD action plan zone and make appointment with provider if in the yellow zone for 48 hours without improvement. Provided patient with education about the role of exercise in the management of COPD Advised patient to engage in light exercise as tolerated 3-5 days a week Provided education about and advised patient to utilize infection prevention strategies to reduce risk of respiratory infection. Patient Goals/Self-Care Activities:  - decision-making supported - depression screen reviewed - emotional support provided - family involvement promoted - problem-solving facilitated - relaxation techniques promoted - verbalization of feelings encouraged Follow Up Plan: Telephone follow up appointment with care management team member  scheduled.   Problem: RNCM: Symptom Exacerbation (COPD)   Priority: High  Onset Date: 05/22/2020     Long-Range Goal: RNCM: Smoking Cessation Symptom Exacerbation Prevented or Minimized   Start Date: 05/22/2020  Expected End Date: 05/20/2022  Recent  Progress: Not on track  Priority: High  Note:   Current Barriers:  Unable to independently stop smoking  Does not adhere to provider recommendations re: smoking cessation  Lacks social connections Does not contact provider office for questions/concerns Tobacco abuse of >40 years; trying to stop-says she has stopped Previous quit attempts, unsuccessful several successful using patches, gum- has a pattern of stopping and then starting back  Reports smoking within 30 minutes of waking up Reports triggers to smoke include: stress, chronic conditions  Reports motivation to quit smoking includes: knows it will help her breathing and other chronic conditions  On a scale of 1-10, reports MOTIVATION to quit is 8 On a scale of 1-10, reports CONFIDENCE in quitting is 7 02/17/22:  Continues to smoke 2-3 cigarettes a day, trying to stop with activities Clinical Goal(s):  patient will work with RN Case Engineer, civil (consulting) and provider towards tobacco cessation  Interventions: Collaboration with [name], NP regarding development and update of comprehensive plan of care as evidenced by provider attestation and co-signature Inter-disciplinary care team collaboration (see longitudinal plan of care) Evaluation of current treatment plan reviewed.  Provided contact information for [place] [facility] (1-800-QUIT-NOW). Patient will outreach this group for support. Update 01/22/21:  Patient states she has utilized [employer] for resources. Discussed plans with patient for ongoing care management follow up and provided patient with direct contact information for care management team Provided patient with printed smoking cessation educational materials Provided contact information for [place] [facility] (1-800-QUIT-NOW). Patient will outreach this group for support. Praised patient for decreasing smoking- continued encouragement given. Patient Goals/Self-Care Activities patient will:  -  mindfulness, talking to family and friends as a habit during cravings  - verbally commit to reducing tobacco consumption - barriers to lifestyle changes reviewed and addressed - barriers to treatment reviewed and addressed - breathing techniques encouraged - modification of home and work environment promoted - rescue (action) plan reviewed - signs/symptoms of infection reviewed - signs/symptoms of worsening disease assessed - treatment plan reviewed Follow Up Plan: Telephone follow up appointment with care management team member scheduled.    Care Plan : RNCM: Hypertension (Adult)  Updates made by [name], RN since 02/17/2022 12:00 AM     Problem: RNCM: Hypertension (Hypertension)   Priority: Medium  Onset Date: 05/22/2020     Long-Range Goal: RNCM: Hypertension Monitored   Start Date: 05/22/2020  Expected End Date: 05/20/2022  Recent Progress: On track  Priority: Medium  Note:    Current Barriers:  Knowledge Deficits related to basic understanding of hypertension pathophysiology and self care management Knowledge Deficits related to understanding of medications prescribed for management of hypertension Limited Social Support Unable to independently manage HTN Lacks social connections Does not contact provider office for questions/concern 02/17/22:  BP 165/60 per patient-has f/u appts scheduled. Case Manager Clinical Goal(s):   patient will verbalize understanding of plan for hypertension management patient will demonstrate improved adherence to prescribed treatment plan for hypertension as evidenced by taking all medications as prescribed, monitoring and recording blood pressure as directed, adhering to low sodium/DASH diet patient will demonstrate improved health management independence as evidenced by checking blood pressure as directed and notifying PCP if SBP>160 or DBP >  90, taking all medications as prescribe, and adhering to a low sodium diet as  discussed. Interventions:  Collaboration with [name], NP regarding development and update of comprehensive plan of care as evidenced by provider attestation and co-signature Inter-disciplinary care team collaboration (see longitudinal plan of care) Evaluation of current treatment plan related to hypertension self management and patient's adherence to plan as established by provider.  Provided education to patient re: stroke prevention, s/s of heart attack and stroke, DASH diet, complications of uncontrolled blood pressure Reviewed medications with patient and discussed importance of compliance.  Discussed plans with patient for ongoing care management follow up and provided patient with direct contact information for care management team Advised patient, providing education and rationale, to monitor blood pressure daily and record, calling PCP for findings outside established parameters.  Patient Goals/Self-Care Activities Over the next 120 days, patient will:  - Self administers medications as prescribed Attends all scheduled provider appointments Calls provider office for new concerns, questions, or BP outside discussed parameters Checks BP and records as discussed Follows a low sodium diet/DASH diet - blood pressure trends reviewed - depression screen reviewed - home or ambulatory blood pressure monitoring encouraged Follow Up Plan: Telephone follow up appointment with care management team member scheduled.     Care Plan : RNCM: Depression (Adult)  Updates made by [name], RN since 02/17/2022 12:00 AM     Problem: RNCM: Symptoms (Depression)   Priority: High  Onset Date: 12/02/2020     Long-Range Goal: RNCM: Symptoms Monitored and Managed   Start Date: 12/02/2020  Expected End Date: 05/21/2023  Recent Progress: Not on track  Priority: High  Note:   Current Barriers:  Ineffective Self Health Maintenance in a patient with Anxiety and Depression Unable to  independently manage depression and anxiety as evidence of multiple chronic conditions and the patient feeling her health is out of control  Does not attend all scheduled provider appointments Lacks social connections Unable to perform IADLs independently Does not contact provider office for questions/concerns 02/17/22:  Patient has not scheduled appt with [facility] yet, LCSW and BSW continue to follow Clinical Goal(s):  Collaboration with [name], NP regarding development and update of comprehensive plan of care as evidenced by provider attestation and co-signature Inter-disciplinary care team collaboration (see longitudinal plan of care) patient will work with care management team to address care coordination and chronic disease management needs related to Disease Management Educational Needs Care Coordination Mental Health Counseling Level of Care Concerns   Interventions:  Evaluation of current treatment plan related to Anxiety and Depression, Financial constraints related to insurance not approving care for the patient to go to wound care , Limited social support, Level of care concerns, ADL IADL limitations, Mental Health Concerns , Family and relationship dysfunction, Substance abuse issues -  smoker , and Inability to perform IADL's independently self-management and patient's adherence to plan as established by provider. Collaboration with [name], NP regarding development and update of comprehensive plan of care as evidenced by provider attestation       and co-signature Inter-disciplinary care team collaboration (see longitudinal plan of care) Discussed plans with patient for ongoing care management follow up and provided patient with direct contact information for care management team Evaluation of depression and anxiety.  LCSW referral for increased stress-completed Collaborated with LCSW  Self Care Activities:  Patient verbalizes understanding of plan  to effectively manage depression and anxiety Self administers medications as prescribed Attends all scheduled provider appointments  Calls pharmacy for medication refills Attends church or other social activities Performs ADL's independently Performs IADL's independently Calls provider office for new concerns or questions Work with the [facility] team to meet needs for continued CCM services  Patient Goals: - activity or exercise based on tolerance encouraged - depression screen reviewed - emotional support provided - healthy lifestyle promoted - medication side effects monitored and managed - pain managed - participation in mental health treatment encouraged - quality of sleep assessed - response to mental health treatment monitored - response to pharmacologic therapy monitored - sleep hygiene techniques encouraged - social activities and relationships encouraged - substance use assessed Follow Up Plan: The care management team will reach out to the patient again over the next 30 business days.   Follow Up:  Patient agrees to Care Plan and Follow-up.  Plan: The Managed Medicaid care management team will reach out to the patient again over the next 30 business  days. and The  Patient has been provided with contact information for the Managed Medicaid care management team and has been advised to call with any health related questions or concerns.  Date/time of next scheduled RN care management/care coordination outreach:  03/18/22 at 1230.

## 2022-02-18 ENCOUNTER — Ambulatory Visit: Payer: Medicaid Other | Admitting: Nurse Practitioner

## 2022-02-19 ENCOUNTER — Other Ambulatory Visit: Payer: Self-pay | Admitting: Obstetrics and Gynecology

## 2022-02-19 DIAGNOSIS — F331 Major depressive disorder, recurrent, moderate: Secondary | ICD-10-CM | POA: Diagnosis not present

## 2022-02-19 NOTE — Patient Outreach (Signed)
Care Coordination  02/19/2022  Cheryl Chandler 1965/12/14 [id number]  RNCM returned patient's phone call.  Patient states she has appointments scheduled with [facility] every Thursday starting next week.  Aida Raider RN, BSN [place]  [facility] - Managed Medicaid High Risk [phone].

## 2022-02-23 ENCOUNTER — Other Ambulatory Visit: Payer: Self-pay | Admitting: Licensed Clinical Social Worker

## 2022-02-23 DIAGNOSIS — G4736 Sleep related hypoventilation in conditions classified elsewhere: Secondary | ICD-10-CM | POA: Diagnosis not present

## 2022-02-23 NOTE — Patient Outreach (Addendum)
Medicaid Managed Care Social Work Note  02/23/2022 Name:  Cheryl Chandler MRN:  [id number] DOB:  02-06-1966  Cheryl Chandler is an 56 y.o. year old female who is a primary patient of Cheryl Billings, NP.  The Medicaid Managed Care Coordination team was consulted for assistance with:  [place] and Resources  [name] was given information about Medicaid Managed Care Coordination team services today. [place] Patient agreed to services and verbal consent obtained.  Engaged with patient  for by telephone forfollow up visit in response to referral for case management and/or care coordination services.   Assessments/Interventions:  Review of past medical history, allergies, medications, health status, including review of consultants reports, laboratory and other test data, was performed as part of comprehensive evaluation and provision of chronic care management services.  SDOH: (Social Determinant of Health) assessments and interventions performed: [name]    Flowsheet Row Patient Outreach Telephone from 02/19/2022 in [place] Patient Outreach Telephone from 02/17/2022 in [place] Patient Outreach Telephone from 01/19/2022 in [place] Patient Outreach from 01/01/2022 in [place] Patient Outreach Telephone from 12/11/2021 in [place] Patient Outreach Telephone from 12/05/2021 in [place] Coordination  [name]        Food Insecurity Interventions -- -- -- -- -- Other (Comment)  [has resources]  Transportation Interventions -- -- -- Patient Resources Tax adviser) -- --  Utilities Interventions -- Intervention Not Indicated -- -- -- --  Alcohol Usage Interventions Intervention Not Indicated (Score <7)  -- -- -- -- --  Financial Strain Interventions -- -- Intervention Not Indicated  [facility] B[facility]] Other (Comment)  [MM care team] -- --  Physical Activity Interventions -- Other (Comments)  [not physically able to engage in moderate to strenuous exercise] -- -- -- --  Stress Interventions -- -- [employer], Provide Counseling Provide Counseling Offered [employer], Provide Counseling --       Advanced Directives Status:  See Care Plan for related entries.  Care Plan                 Allergies  Allergen Reactions   Codeine Shortness Of Breath and Rash   Percocet [Oxycodone-Acetaminophen] Shortness Of Breath   Sulfa Antibiotics Shortness Of Breath and Rash   Aspirin Hives   Bee Venom     Bee stings   Darvon [Propoxyphene]     Darvocet-N 100   Latex    Oxycodone Nausea And Vomiting   Penicillins     Has patient had a PCN reaction causing immediate rash, facial/tongue/throat swelling, SOB or lightheadedness with hypotension: Unknown Has patient had a PCN reaction causing severe rash involving mucus membranes or skin necrosis: Unknown Has patient had a PCN reaction that required hospitalization: Unknown Has patient had a PCN reaction occurring within the last 10 years: Unknown If all of the above answers are "NO", then may proceed with Cephalosporin use.    Medications Reviewed Today     Reviewed by [name], RN (Registered Nurse) on 02/19/22 at [place] List Status: <None>   Medication Order Taking? Sig Documenting Provider Last Dose Status Informant  ACCU-CHEK GUIDE test strip [id number] No USE TO CHECK BLOOD SUGAR 3 TIMES DAILY AS DIRECTED Cheryl Billings, NP Taking Active   Accu-Chek Softclix Lancets lancets [id number] No CHECK BLOOD SUGAR TWICE DAILY Cheryl Billings,  NP Taking Active   albuterol (PROVENTIL) (2.5 MG/3ML) 0.083% nebulizer solution [id number] No USE 1 VIAL VIA NEBULIZER EVERY 4 HOURS AS NEEDED Cheryl Billings, NP Taking  Active   amLODipine (NORVASC) 5 MG tablet [id number] No TAKE 1 TABLET BY MOUTH ONCE DAILY Cheryl Billings, NP Taking Active   atorvastatin (LIPITOR) 80 MG tablet [id number] No TAKE 1 TABLET BY MOUTH ONCE EVERY [name], NP Taking Active   Blood Glucose Monitoring Suppl (ACCU-CHEK GUIDE) w/Device KIT [id number] No Use to check blood sugar 3 times a day. [name], NP Taking Active   Blood Pressure Monitoring (OMRON 3 SERIES BP MONITOR) DEVI [id number] No Use to check blood pressure as directed Cheryl Billings, NP Taking Active   citalopram (CELEXA) 40 MG tablet [id number] No TAKE 1 TABLET BY MOUTH ONCE DAILY Cheryl Billings, NP Taking Active   EPINEPHrine 0.3 mg/0.3 mL IJ SOAJ injection [id number] No INJECT 1 SYRINGE INTO OUTER THIGH ONCE AS NEEDED FOR SEVERE ALLERGIC REACTION. Cheryl Billings, NP Taking Active   Fluticasone-Umeclidin-Vilant (TRELEGY ELLIPTA) 100-62.5-25 MCG/ACT AEPB [id number]  Inhale 1 puff into the lungs daily. [name], MD  Active   Fluticasone-Umeclidin-Vilant [facility] ELLIPTA) 100-62.5-25 MCG/ACT AEPB [id number]  Inhale 1 puff into the lungs daily. [name], MD  Active   furosemide (LASIX) 20 MG tablet [id number] No TAKE 1 TABLET BY MOUTH ONCE DAILY IN AFTERNOON AS NEEDED LEG EDEMA Cheryl Billings, NP Taking Active   gabapentin (NEURONTIN) 300 MG capsule [id number] No TAKE 1 CAPSULE BY MOUTH 3 TIMES DAILY ASNEEDED (NEED OFFICE VISIT FOR FURTHER REFILLS) Cheryl Billings, NP Taking Active   hydrALAZINE (APRESOLINE) 25 MG tablet [id number] No TAKE 1 TABLET BY MOUTH 3 TIMES DAILY Cheryl Billings, NP Taking Active   levothyroxine (SYNTHROID) 50 MCG tablet [id number] No TAKE 1 TABLET BY MOUTH ONCE DAILY ON AN EMPTY STOMACH. WAIT 30 MINUTES BEFORE TAKING OTHER MEDS. Cheryl Billings, NP Taking Active   lisinopril (ZESTRIL) 40 MG tablet [id number] No TAKE 1 TABLET BY MOUTH ONCE DAILY Cheryl Billings, NP Taking Active   meloxicam (MOBIC) 15  MG tablet [id number] No TAKE 1 TABLET BY MOUTH ONCE DAILY AS NEEDED WITH FOOD OR MILK Cheryl Billings, NP Taking Active   methocarbamol (ROBAXIN) 500 MG tablet [id number] No Take 500 mg by mouth every 8 (eight) hours as needed for muscle spasms.  Patient not taking: Reported on 02/02/2022   [provider] Not Taking Active   montelukast (SINGULAIR) 10 MG tablet [id number] No TAKE 1 TABLET BY MOUTH AT BEDTIME Cheryl Billings, NP Taking Active   nitroGLYCERIN (NITROSTAT) 0.4 MG SL tablet [id number] No DISSOLVE 1 TABLET UNDER TONGUE AT ONSET OF CHEST PAIN. REPEAT IN 5 MIN IF NOT RESOLVED, MAX 3 DOSES. 911 IF NEEDED. Cheryl Billings, NP Taking Active   omeprazole (PRILOSEC) 20 MG capsule [id number] No TAKE 1 CAPSULE BY MOUTH ONCE DAILY Cheryl Billings, NP Taking Active   spironolactone (ALDACTONE) 25 MG tablet [id number] No Take 0.5 tablets (12.5 mg total) by mouth daily. Cheryl Billings, NP Taking Active   triamcinolone cream (KENALOG) 0.1 % [id number] No Apply 1 application. topically 2 (two) times daily. Cheryl Billings, NP Taking Active   triamcinolone cream (KENALOG) 0.1 % [id number] No APPLY 1 APPLICATION TOPICALLY TWICE DAILY  Patient not taking: Reported on 02/02/2022   Cheryl Billings, NP Not Taking Active   TRULICITY 3.29 VB/1.6OM SOPN [id number] No INJECT 0.75MG SUBQ ONCE A WEEK Cheryl Billings, NP Taking Active   Vitamin D, Ergocalciferol, (DRISDOL) 1.25 MG (  50000 UNIT) CAPS capsule [id number] No TAKE 1 CAPSULE BY MOUTH EVERY 7 DAYS Cheryl Billings, NP Taking Active             Patient Active Problem List   Diagnosis Date Noted   Foot pain, right 09/01/2021   Fibrocystic disease of both breasts 05/07/2019   IFG (impaired fasting glucose) 05/03/2019   Chronic venous insufficiency 11/09/2018   Overactive bladder 09/30/2018   Peripheral neuropathy 09/30/2018   Apnea 01/02/2016   Lymphedema 11/30/2014   Dyspnea on exertion 11/30/2014   Anxiety 11/29/2014    Arthritis 11/29/2014   COPD (chronic obstructive pulmonary disease) ([place]) 11/29/2014   Clinical depression 11/29/2014   H/O eating disorder 11/29/2014   Esophageal reflux 11/29/2014   Acne inversa 11/29/2014   Essential hypertension 11/29/2014   HLD (hyperlipidemia) 11/29/2014   Adult hypothyroidism 11/29/2014   Insomnia 11/29/2014   Low back pain 11/29/2014   Morbid obesity ([place]) 11/29/2014   Posttraumatic stress disorder 11/29/2014   Vitamin D deficiency 11/29/2014   Nicotine dependence, cigarettes, uncomplicated 62/06/5595    Conditions to be addressed/monitored per PCP order:  Depression  Care Plan : General Social Work (Adult)  Updates made by [name], LCSW since 02/23/2022 12:00 AM     Problem: Coping Skills (General Plan of Care)      Long-Range Goal: Coping Skills Enhanced   Start Date: 11/07/2020  Expected End Date: 03/07/2022  Recent Progress: On track  Priority: High  Note:   Timeframe:  Long-Range Goal Priority:  High Start Date:       08/26/21                    Expected End Date:  ongoing   Current Barriers:  Financial constraints Mental Health Concerns  Social Isolation Limited education about mental health support resources that are available to her within the local area* Cognitive Deficits Lacks knowledge of community resource   Clinical Social Work Clinical Goal(s):  Over the next 90 days, client will work with SW to address concerns related to experiencing ongoing symptoms of depression, sadness and grief since the passing of her mother Over the next 90 days, patient will work with LCSW to address needs related to implementing appropriate self-care and depression management.    Interventions: Patient interviewed and appropriate assessments performed Provided mental health counseling with regards to patient's loss and ongoing stressors. Patient was very close with her mother who passed away a few years ago and she continues to experience  symptoms of grief from this loss. Patient denies wanting LCSW to make a referral for grief counseling through [place] again but was receptive to coping skill and resource education/information that was provided during session today. Patient reports decorating for the holidays helps her to connect with her mother as they did this together when she was alive.  Provided patient with information about managing depression within her daily life. Patient has ongoing grief symptoms as well. Patient states "I feel worn down." Patient reports that she talks out loud to her mother's picture and this is very helpful to her. Grief support resource education provided again during session today.  Provided reflective listening and implemented appropriate interventions to help support patient and her emotional needs  Discussed plans with patient for ongoing care management follow up and provided patient with direct contact information for [facility] team Advised patient to contact [facility] team with any urgent case management needs. Assisted patient/caregiver with obtaining information about health plan benefits LCSW completed  past referral for family to have a wheelchair ramp built at their residence due to ongoing mobility concerns. Ramp has been successfully installed.  PCS actively involved with patient and providing ongoing services.  Positive reinforcement provided to patient for quitting smoking cigarettes. Coping skill review education provided for future triggers.  Brief self-care education provided to both patient and spouse. Patient confirms that she continues to go to food pantry for food insecurity. Resource information has been provided.  Patient reports ongoing pain and issues with mobility. Patient shares that her pain resides in her pains and legs. Emotional support and coping skill education provided. Patient confirms that her PCS aide continues to provide her with services. However, aide has to be reminded to  put on her mask since patient is high risk. LCSW reviewed resources that [name] provided her with in the past.  Patient reports that she had a wonderful birthday spent with her family this past May. Patient reports receiving appropriate socialization. Patient reports that her spouse took her out to eat and gifted her with several things which was a nice surprise.  Patient confirms stable transportation arrangements for upcoming PCP appointment this week.  Patient repots that she is losing a lot of weight but is not sure how much she has lost as she does not have a scale. Patient reports that a goal of hers is to lose weight but she has not been intentionally losing weight at this time.  Patient reports that her aide [name] continues to assist her with PCS and is an excellent support within the home for her and spouse.  Patient planted a tomato plant and successfully grew over 5 tomatoes over the summer. Patient is very proud of this achievement as this was her first time planting a tomato plant and is now interested in picking up gardening/planting as a hobby. Patient reports that her aunt continues to be a positive support for her in her life and someone that she can rely on in the case of emergencies.  Patient was educated on healthy self-care skills and advised her to use these into her daily routine.  04/10/21- Update- Patient is agreeable to social work closure at this time as she still does not need medication management or counseling as she is managing her mental health well at this time. Patient talks to her younger sister everyday to gain emotional support. Patient says that her sister is into body building and helps to educate and remind her to do her own self-care. Patient is agreeable to contact [facility] LCSW directly if any social work concern arise. [facility] LCSW will complete case closure at this time.  Patient reports that she has been elevating her legs daily to elevate pain and swelling.  [facility]  LCSW completed care coordination with Thomasene Lot on 01/15/21 and [facility] LCSW provided the following suggested resource-It is likely their best option given their loss of housing and lack of consistent income:   Publishing rights manager                                                                                                                       (  Emergency Crisis Housing) [phone] Ext. 104 M-F, 8am - 4pm Patient was successfully approved for food stamps but is still experiencing financial concerns. She reports that she and her husband have no clothes that fit them and they are having to start using a string for their pants and patient only has one bra at this time. [facility] LCSW will send referral to [facility] BSW today for financial support.  BSW spoke with patient about resources needed. Patient states she does receive SSI and was approved for foodstamps. Patient is familiar with all of the food pantries and other resources in [place]. BSW will assist patient with finding an eye doctor. Patient states she has not been in 10 years. BSW will research and put a list of eye doctors together that accept Medicaid in [facility]. 04/30/20: BSW contacted and spoke with patient, she stated she was not doing good because she found out that she has a new landlord and they are going up on her rent. BSW informed patient to make sure she informs her social workers at [employer] that they have an increase for foodstamps and Medicaid. Patient stated that she sleeps on the couch and is in need of a power recliner/lift chair. BSW will send patients PCP a message asking for a script to be sent. 05/21/21: BSW completed follow up telephone call with patient. She states she is unsure if their new landlord has received their January rent. Patient states she is also worried due to the new landlord making them pay rent online and on the 1st of the month. Patient states she is unable to get anyone on the phone.  Patient states her rent will go up to 400 from 255 in March. Patient states she does not have many clothes but goes to [employer] to get some Materials engineer. No other resources needed at this time.   06/20/21: BSW completed follow up telelphone call with patient. Patient states she is doing well. Patient states [employer] [place] will cover all of Vaccines. Patient states no resources are needed at this time. 08/26/21: BSW completed telephone outreach with patient, she stated since going to the hospital she has been feeling weak. Patient states other than that she is feeling okay. Patient stated she is going to get a new phone on Wednesday. Patient states her birthday is on Friday and her husband may take her to a [place]. No other resources are needed at this time.Update- Patient reports experiencing additional grief lately. [facility] LCSW offered referral for berievement counseling but she declined. UPDATE 08/26/21- Patient reports that she has a near death experience last month. Patient shares that she had a fall. 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 [facility] LCSW completed education on available mental health resources within her area to utilize. Patient admits to experiencing some sadness due to grief from her mother's passing. Emotional support provided. 10/13/21 UPDATE- Patient reports that she is working hard on living a healthier life both physically and mentally. Patient reports that she is making active changes in her diet. She reports that she eventually wishes to quit smoking again and has started picking up new hobbies to implement during times of craving such as coloring, crocheting and being creative. Patient reports that she is unsure if she needs mental health support at this time because her main concerns are centered around her and her spouse's physical health instead their mental health. [facility] LCSW educated patient on how they affect one another and their importance. Patient denies any  urgent concerns. She reports no issues with affording food due to food stamp assistance. [facility] LCSW will reassess social work needs 30 days from now. UPDATE 11/17/21- Patient reports that she is experiencing several stressors at this time. She was encouraged to consider mental health treatment but she is not interested at this time. Patient was encouraged to consider mental health resources. Patient reports that their car is in the shop and that they have no stable transportation besides their sister and her husband's father. Patient reports that her spouse has had several recent health issues that have affected her mental state. Patient was educated on healthy coping skills for stress. Patient denies no urgent concerns or issues at this time. UPDATE- Care Coordination completed with [facility] LCSW, PCP office, Pharmacist, [facility] BSW and PCP. Patient has canceled several medical appointments because they are unable to afford a copay. Patient will benefit from financial assistance and may benefit from a [facility] application. Patient has an upcoming appointment with [facility] BSW next week. Patient is agreeable to exploring financial assistance options with [facility] BSW that will help alleviate other areas of financial stress within their life in order for patient to be able to afford medical expenses. [facility] LCSW encouraged patient to consider individual counseling. Patient is agreeable to referral for counseling but needs to find out her sister's email first as she is unable to go to office to fill out intake paperwork. Patient prefers virtual counseling to save money on transportation. [facility] LCSW will successfully place referral to [facility] during next outreach. [facility] LCSW completed care coordination with [facility] BSW.  BSW 12/15/21: BSW completed a telephone outreach with patient. She stated everything is still going okay. She has been having problems with her leg wraps and has an appointment today. She states her husband is still  having issues with his eye and with the doctor and she is going to make a compliant. Patient states no resources are needed at this time. [facility] LCSW 01/19/22: Patient reports that she was unable to sleep last night. She is experiencing a break out skin reaction that she feels is caused by stress. Her financial stressors have increased since last session. Patient is agreeable to [facility] LCSW placing referral for [facility] now but reports that she has no email address. [facility] LCSW completed referral and made note of her inability to get into her email. [facility] LCSW will follow up within two weeks to ensure that she was enrolled in [facility] counseling program. [facility]W completed care coordination call ONLY to [facility] and they were unable to locate patient's referral that was made on 01/19/22. [facility] LCSW resubmitted this referral today on 01/29/22 for telephonic counseling. Patient was made aware by telephone call that [facility] will contact her by the end of the day tomorrow to schedule a time for her to come into their office to complete in person intake paperwork. Patient is agreeable to this plan. UPDATE 02/03/22- Patient reports that she missed a call from June the receptionist at [facility] and call her back but has not heard back yet. [facility] LCSW provided patient with her number and directions on which prompt to enter to be directed to June. Patient will ask June about her phone as patient is unsure if she can make video calls and complete telephonic therapy which would mean she would need to go for in person sessions. Patient is agreeable to plan. Patient was able to find an assistance program for rent through [facility] on her own. Positive reinforcement provided for this resource find. [facility] LCSW collaborated with entire team. [facility] LCSW will follow up in two weeks to ensure that patient was able to get connected with either telephonic or in person therapy services at [facility].  UPDATE 02/23/22-Patient has decided to decline therapy at this time stating that she "just has too much going on to add in anything else into her schedule." [facility] LCSW will update team on this decision. Reflective and active listening provided during session. [facility] LCSW will follow up in 90 days to ask patient again about mental health support involvement. Patient is agreeable to this plan.   Managing Loss, Adult People experience loss in many different ways throughout their lives. Events such as moving, changing jobs, and losing friends can create a sense of loss. The loss may be as serious as a major health change, divorce, death of a pet, or death of a loved one. All of these types of loss are likely to create a physical and emotional reaction known as grief. Grief is the result of a major change or an absence of something or someone that you count on. Grief is a normal reaction to loss. A variety of factors can affect your grieving experience, including: The nature of your loss. Your relationship to what or whom you lost. Your understanding of grief and how to manage it. Your support system. How to manage lifestyle changes Keep to your normal routine as much as possible. If you have trouble focusing or doing normal activities, it is acceptable to take some time away from your normal routine. Spend time with friends and loved ones. Eat a healthy diet, get plenty of sleep, and rest when you feel tired. How to recognize changes  The way that you deal with your grief will affect your ability to function as you normally do. When grieving, you may experience these changes: Numbness, shock, sadness, anxiety, anger, denial, and guilt. Thoughts about death. Unexpected crying. A physical sensation of emptiness in your stomach. Problems sleeping and eating. Tiredness (fatigue). Loss of interest in normal activities. Dreaming about or imagining seeing the person who died. A need to remember what or whom  you lost. Difficulty thinking about anything other than your loss for a period of time. Relief. If you have been expecting the loss for a while, you may feel a sense of relief when it happens. Follow these instructions at home:    Activity Express your feelings in healthy ways, such as: Talking with others about your loss. It may be helpful to find others who have had a similar loss, such as a support group. Writing down your feelings in a journal. Doing physical activities to release stress and emotional energy. Doing creative activities like painting, sculpting, or playing or listening to music. Practicing resilience. This is the ability to recover and adjust after facing challenges. Reading some resources that encourage resilience may help you to learn ways to practice those behaviors. General instructions Be patient with yourself and others. Allow the grieving process to happen, and remember that grieving takes time. It is likely that you may never feel completely done with some grief. You may find a way to move on while still cherishing memories and feelings about your loss. Accepting your loss is a process. It can take months or longer to adjust. Keep all follow-up visits as told by your health care provider. This is important. Where to find support To get support for managing loss:  Ask your health care provider for help and recommendations, such as grief counseling or therapy. Think about joining a support group for people who are managing a loss. Where to find more information You can find more information about managing loss from: American Society of Clinical Oncology: www.cancer.net American Psychological Association: TVStereos.ch Contact a health care provider if: Your grief is extreme and keeps getting worse. You have ongoing grief that does not improve. Your body shows symptoms of grief, such as illness. You feel depressed, anxious, or lonely. Get help right away if: You  have thoughts about hurting yourself or others. If you ever feel like you may hurt yourself or others, or have thoughts about taking your own life, get help right away. You can go to your nearest emergency department or call: Your local emergency services (911 in the [place] A suicide crisis helpline, such as the [facility] at 509 444 1090. This is open 24 hours a day. Summary Grief is the result of a major change or an absence of someone or something that you count on. Grief is a normal reaction to loss. The depth of grief and the period of recovery depend on the type of loss and your ability to adjust to the change and process your feelings. Processing grief requires patience and a willingness to accept your feelings and talk about your loss with people who are supportive. It is important to find resources that work for you and to realize that people experience grief differently. There is not one grieving process that works for everyone in the same way. Be aware that when grief becomes extreme, it can lead to more severe issues like isolation, depression, anxiety, or suicidal thoughts. Talk with your health care provider if you have any of these issues. This information is not intended to replace advice given to you by your health care provider. Make sure you discuss any questions you have with your health care provider. Document Revised: 06/17/2018 Document Reviewed: 08/27/2016 Elsevier Patient Education  [place] for Managing Grief   When you are experiencing grief, many resources and support are available to help you. You are not alone.    Grief Share (https://www.https://jacobson-moore.net/):    [employer] [place], [place]  [phone] [address] [place], [place], [place], [place], [place]   [facility] [address] [place], [place]   [place], [place]   If a Hospice or [place] team has been involved in the care of your loved one, you may reach out to your contact there or locally, you may reach out to [facility]:    Hospice and [place] [facility]   [place], [place] [address] [phone] Patient was educated on healthy self-care as she admits she has little motivation to walk lately. PCS recommendation was suggested.   Patient Self Care Activities:  Attends all scheduled provider appointments Calls provider office for new concerns or questions  Please see past updates related to this goal by clicking on the "Past Updates" button  in the selected goal        Follow up:  Patient agrees to Care Plan and Follow-up.  Plan: The Managed Medicaid care management team will reach out to the patient again over the next 90 days.  Date/time of next scheduled Social Work care management/care coordination outreach:  04/24/22 at 2:30 pm  Eula Fried, BSW, MSW, [place] Medicaid LCSW [email or web address] Phone: [phone]

## 2022-02-23 NOTE — Telephone Encounter (Signed)
Ashly from [facility] called today stating they were having issues getting in touch with the patient.  They are scheduled for today 02/23/22

## 2022-02-23 NOTE — Patient Instructions (Signed)
Visit Information  Cheryl Chandler was given information about Medicaid Managed Care team care coordination services as a part of their [facility] Medicaid benefit. Cheryl Chandler verbally consented to engagement with the [facility] Managed Care team.   If you are experiencing a medical emergency, please call 911 or report to your local emergency department or urgent care.   If you have a non-emergency medical problem during routine business hours, please contact your provider's office and ask to speak with a nurse.   For questions related to your [facility] health plan, please call: [phone] or go here:[email or web address]  If you would like to schedule transportation through your [facility] plan, please call the following number at least 2 days in advance of your appointment: [phone].  You can also use the MTM portal or MTM mobile app to manage your rides. For the portal, please go to [email or web address].  Call the [place] at [phone], at any time, 24 hours a day, 7 days a week. If you are in danger or need immediate medical attention call 911.  If you would like help to quit smoking, call 1-800-QUIT-NOW (1-6783077256) OR Espaol: 1-855-Djelo-Ya (2-836-629-4765) o para ms informacin haga clic aqu or Text READY to 200-400 to register via text   Following is a copy of your plan of care:  Care Plan : General Social Work (Adult)  Updates made by [name], LCSW since 02/23/2022 12:00 AM     Problem: Coping Skills (General Plan of Care)      Long-Range Goal: Coping Skills Enhanced   Start Date: 11/07/2020  Expected End Date: 03/07/2022  Recent Progress: On track  Priority: High  Note:   Timeframe:  Long-Range Goal Priority:  High Start Date:       08/26/21                    Expected End Date:  ongoing   Current Barriers:  Financial constraints Mental Health Concerns  Social Isolation Limited education about mental  health support resources that are available to her within the local area* Cognitive Deficits Lacks knowledge of community resource   Clinical Social Work Clinical Goal(s):  Over the next 90 days, client will work with SW to address concerns related to experiencing ongoing symptoms of depression, sadness and grief since the passing of her mother Over the next 90 days, patient will work with LCSW to address needs related to implementing appropriate self-care and depression management.    Managing Loss, Adult People experience loss in many different ways throughout their lives. Events such as moving, changing jobs, and losing friends can create a sense of loss. The loss may be as serious as a major health change, divorce, death of a pet, or death of a loved one. All of these types of loss are likely to create a physical and emotional reaction known as grief. Grief is the result of a major change or an absence of something or someone that you count on. Grief is a normal reaction to loss. A variety of factors can affect your grieving experience, including: The nature of your loss. Your relationship to what or whom you lost. Your understanding of grief and how to manage it. Your support system. How to manage lifestyle changes Keep to your normal routine as much as possible. If you have trouble focusing or doing normal activities, it is acceptable to take some time away from your normal routine. Spend time with friends and loved ones. Eat  a healthy diet, get plenty of sleep, and rest when you feel tired. How to recognize changes  The way that you deal with your grief will affect your ability to function as you normally do. When grieving, you may experience these changes: Numbness, shock, sadness, anxiety, anger, denial, and guilt. Thoughts about death. Unexpected crying. A physical sensation of emptiness in your stomach. Problems sleeping and eating. Tiredness (fatigue). Loss of interest in  normal activities. Dreaming about or imagining seeing the person who died. A need to remember what or whom you lost. Difficulty thinking about anything other than your loss for a period of time. Relief. If you have been expecting the loss for a while, you may feel a sense of relief when it happens. Follow these instructions at home:    Activity Express your feelings in healthy ways, such as: Talking with others about your loss. It may be helpful to find others who have had a similar loss, such as a support group. Writing down your feelings in a journal. Doing physical activities to release stress and emotional energy. Doing creative activities like painting, sculpting, or playing or listening to music. Practicing resilience. This is the ability to recover and adjust after facing challenges. Reading some resources that encourage resilience may help you to learn ways to practice those behaviors. General instructions Be patient with yourself and others. Allow the grieving process to happen, and remember that grieving takes time. It is likely that you may never feel completely done with some grief. You may find a way to move on while still cherishing memories and feelings about your loss. Accepting your loss is a process. It can take months or longer to adjust. Keep all follow-up visits as told by your health care provider. This is important. Where to find support To get support for managing loss: Ask your health care provider for help and recommendations, such as grief counseling or therapy. Think about joining a support group for people who are managing a loss. Where to find more information You can find more information about managing loss from: American Society of Clinical Oncology: www.cancer.net American Psychological Association: TVStereos.ch Contact a health care provider if: Your grief is extreme and keeps getting worse. You have ongoing grief that does not improve. Your body shows  symptoms of grief, such as illness. You feel depressed, anxious, or lonely. Get help right away if: You have thoughts about hurting yourself or others. If you ever feel like you may hurt yourself or others, or have thoughts about taking your own life, get help right away. You can go to your nearest emergency department or call: Your local emergency services (911 in the [place] A suicide crisis helpline, such as the [place] at 385-411-1773. This is open 24 hours a day. Summary Grief is the result of a major change or an absence of someone or something that you count on. Grief is a normal reaction to loss. The depth of grief and the period of recovery depend on the type of loss and your ability to adjust to the change and process your feelings. Processing grief requires patience and a willingness to accept your feelings and talk about your loss with people who are supportive. It is important to find resources that work for you and to realize that people experience grief differently. There is not one grieving process that works for everyone in the same way. Be aware that when grief becomes extreme, it can lead to more severe issues  like isolation, depression, anxiety, or suicidal thoughts. Talk with your health care provider if you have any of these issues. This information is not intended to replace advice given to you by your health care provider. Make sure you discuss any questions you have with your health care provider. Document Revised: 06/17/2018 Document Reviewed: 08/27/2016 Elsevier Patient Education  [place] for Managing Grief   When you are experiencing grief, many resources and support are available to help you. You are not alone.    Grief Share (https://www.https://jacobson-moore.net/):    [employer] [place], [place]  [phone] [address] [place], [place], [place], [place], [place]   [facility] [address] [place], [place]   [place], [place]   If a Hospice or [place] team has been involved in the care of your loved one, you may reach out to your contact there or locally, you may reach out to [facility]osp[facility] of [facility]:    Hospice and [place] of [facility]   [place], [place] [address] [phone] Patient was educated on healthy self-care as she admits she has little motivation to walk lately. PCS recommendation was suggested.   Patient Self Care Activities:  Attends all scheduled provider appointments Calls provider office for new concerns or questions  Please see past updates related to this goal by clicking on the "Past Updates" button in the selected goal

## 2022-02-25 DIAGNOSIS — Z419 Encounter for procedure for purposes other than remedying health state, unspecified: Secondary | ICD-10-CM | POA: Diagnosis not present

## 2022-03-04 ENCOUNTER — Other Ambulatory Visit: Payer: Self-pay

## 2022-03-04 NOTE — Patient Instructions (Signed)
Visit Information  Ms. Fencl was given information about Medicaid Managed Care team care coordination services as a part of their [facility] Medicaid benefit. Sira Glennice Marcos verbally consented to engagement with the [facility] Managed Care team.   If you are experiencing a medical emergency, please call 911 or report to your local emergency department or urgent care.   If you have a non-emergency medical problem during routine business hours, please contact your provider's office and ask to speak with a nurse.   For questions related to your [facility] health plan, please call: [phone] or go here:[email or web address]  If you would like to schedule transportation through your [facility] plan, please call the following number at least 2 days in advance of your appointment: [phone].  You can also use the MTM portal or MTM mobile app to manage your rides. For the portal, please go to [email or web address]  Call the [facility] Crisis Line at [phone], at any time, 24 hours a day, 7 days a week. If you are in danger or need immediate medical attention call 911.  If you would like help to quit smoking, call 1-800-QUIT-NOW (438-024-1127) OR Espaol: 1-855-Djelo-Ya (4-132-440-1027) o para ms informacin haga clic aqu or Text READY to 253-664 to register via text  Ms. Nugent - following are the goals we discussed in your visit today:   Goals Addressed   None       Social Worker will follow up in 04/03/22.   [name], BSW, [facility]  [place]  High Risk Managed Medicaid Team  [phone]   Following is a copy of your plan of care:  Care Plan : General Social Work (Adult)  Updates made by [name] since 03/04/2022 12:00 AM     Problem: Coping Skills (General Plan of Care)      Long-Range Goal: Coping Skills Enhanced   Start Date: 11/07/2020  Expected End Date: 03/07/2022  Recent Progress: On  track  Priority: High  Note:   Timeframe:  Long-Range Goal Priority:  High Start Date:       08/26/21                    Expected End Date:  ongoing   Current Barriers:  Financial constraints Mental Health Concerns  Social Isolation Limited education about mental health support resources that are available to her within the local area* Cognitive Deficits Lacks knowledge of community resource   Clinical Social Work Clinical Goal(s):  Over the next 90 days, client will work with SW to address concerns related to experiencing ongoing symptoms of depression, sadness and grief since the passing of her mother Over the next 90 days, patient will work with LCSW to address needs related to implementing appropriate self-care and depression management.    Interventions: Patient interviewed and appropriate assessments performed Provided mental health counseling with regards to patient's loss and ongoing stressors. Patient was very close with her mother who passed away a few years ago and she continues to experience symptoms of grief from this loss. Patient denies wanting LCSW to make a referral for grief counseling through [facility] again but was receptive to coping skill and resource education/information that was provided during session today. Patient reports decorating for the holidays helps her to connect with her mother as they did this together when she was alive.  Provided patient with information about managing depression within her daily life. Patient has ongoing grief symptoms as well. Patient states "I feel  worn down." Patient reports that she talks out loud to her mother's picture and this is very helpful to her. Grief support resource education provided again during session today.  Provided reflective listening and implemented appropriate interventions to help support patient and her emotional needs  Discussed plans with patient for ongoing care management follow up and provided patient  with direct contact information for [facility] team Advised patient to contact [facility] team with any urgent case management needs. Assisted patient/caregiver with obtaining information about health plan benefits LCSW completed past referral for family to have a wheelchair ramp built at their residence due to ongoing mobility concerns. Ramp has been successfully installed.  PCS actively involved with patient and providing ongoing services.  Positive reinforcement provided to patient for quitting smoking cigarettes. Coping skill review education provided for future triggers.  Brief self-care education provided to both patient and spouse. Patient confirms that she continues to go to food pantry for food insecurity. Resource information has been provided.  Patient reports ongoing pain and issues with mobility. Patient shares that her pain resides in her pains and legs. Emotional support and coping skill education provided. Patient confirms that her PCS aide continues to provide her with services. However, aide has to be reminded to put on her mask since patient is high risk. LCSW reviewed resources that [name] provided her with in the past.  Patient reports that she had a wonderful birthday spent with her family this past May. Patient reports receiving appropriate socialization. Patient reports that her spouse took her out to eat and gifted her with several things which was a nice surprise.  Patient confirms stable transportation arrangements for upcoming PCP appointment this week.  Patient repots that she is losing a lot of weight but is not sure how much she has lost as she does not have a scale. Patient reports that a goal of hers is to lose weight but she has not been intentionally losing weight at this time.  Patient reports that her aide [place] continues to assist her with PCS and is an excellent support within the home for her and spouse.  Patient planted a tomato plant and successfully grew over 5  tomatoes over the summer. Patient is very proud of this achievement as this was her first time planting a tomato plant and is now interested in picking up gardening/planting as a hobby. Patient reports that her aunt continues to be a positive support for her in her life and someone that she can rely on in the case of emergencies.  Patient was educated on healthy self-care skills and advised her to use these into her daily routine.  04/10/21- Update- Patient is agreeable to social work closure at this time as she still does not need medication management or counseling as she is managing her mental health well at this time. Patient talks to her younger sister everyday to gain emotional support. Patient says that her sister is into body building and helps to educate and remind her to do her own self-care. Patient is agreeable to contact [facility] LCSW directly if any social work concern arise. [facility] LCSW will complete case closure at this time.  Patient reports that she has been elevating her legs daily to elevate pain and swelling.  [facility] LCSW completed care coordination with Osa Craver on 01/15/21 and [facility] LCSW provided the following suggested resource-It is likely their best option given their loss of housing and lack of consistent income:   Research scientist (physical sciences)                                                                                                                       (  Emergency Crisis Housing) [phone] Ext. 104 M-F, 8am - 4pm Patient was successfully approved for food stamps but is still experiencing financial concerns. She reports that she and her husband have no clothes that fit them and they are having to start using a string for their pants and patient only has one bra at this time. [facility] LCSW will send referral to [facility] BSW today for financial support.  BSW spoke with patient about resources needed. Patient states she does receive SSI and was approved for foodstamps. Patient is  familiar with all of the food pantries and other resources in [place]. BSW will assist patient with finding an eye doctor. Patient states she has not been in 10 years. BSW will research and put a list of eye doctors together that accept Medicaid in [facility]. 04/30/20: BSW contacted and spoke with patient, she stated she was not doing good because she found out that she has a new landlord and they are going up on her rent. BSW informed patient to make sure she informs her social workers at [employer] that they have an increase for foodstamps and Medicaid. Patient stated that she sleeps on the couch and is in need of a power recliner/lift chair. BSW will send patients PCP a message asking for a script to be sent. 05/21/21: BSW completed follow up telephone call with patient. She states she is unsure if their new landlord has received their January rent. Patient states she is also worried due to the new landlord making them pay rent online and on the 1st of the month. Patient states she is unable to get anyone on the phone. Patient states her rent will go up to 400 from 255 in March. 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 No other resources needed at this time.   06/20/21: BSW completed follow up telelphone call with patient. Patient states she is doing well. Patient states Rolene Arbour will cover all of Vaccines. Patient states no resources are needed at this time. 08/26/21: BSW completed telephone outreach with patient, she stated since going to the hospital she has been feeling weak. Patient states other than that she is feeling okay. Patient stated she is going to get a new phone on Wednesday. Patient states her birthday is on Friday and her husband may take her to a [place]. No other resources are needed at this time.Update- Patient reports experiencing additional grief lately. [facility] LCSW offered referral for berievement counseling but  she declined. UPDATE 08/26/21- Patient reports that she has a near death experience last month. Patient shares that she had a fall. 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 [facility] LCSW completed education on available mental health resources within her area to utilize. Patient admits to experiencing some sadness due to grief from her mother's passing. Emotional support provided. 10/13/21 UPDATE- Patient reports that she is working hard on living a healthier life both physically and mentally. Patient reports that she is making active changes in her diet. She reports that she eventually wishes to quit smoking again and has started picking up new hobbies to implement during times of craving such as coloring, crocheting and being creative. Patient reports that she is unsure if she needs mental health support at this time because her main concerns are centered around her and her spouse's physical health instead their mental health. [facility] LCSW educated patient on how they affect one another and their importance. Patient denies any  urgent concerns. She reports no issues with affording food due to food stamp assistance. [facility] LCSW will reassess social work needs 30 days from now. UPDATE 11/17/21- Patient reports that she is experiencing several stressors at this time. She was encouraged to consider mental health treatment but she is not interested at this time. Patient was encouraged to consider mental health resources. Patient reports that their car is in the shop and that they have no stable transportation besides their sister and her husband's father. Patient reports that her spouse has had several recent health issues that have affected her mental state. Patient was educated on healthy coping skills for stress. Patient denies no urgent concerns or issues at this time. UPDATE- Care Coordination completed with [facility] LCSW, PCP office, Pharmacist, [facility] BSW and PCP. Patient has canceled several medical appointments because they  are unable to afford a copay. Patient will benefit from financial assistance and may benefit from a [facility] application. Patient has an upcoming appointment with [facility] BSW next week. Patient is agreeable to exploring financial assistance options with [facility] BSW that will help alleviate other areas of financial stress within their life in order for patient to be able to afford medical expenses. [facility] LCSW encouraged patient to consider individual counseling. Patient is agreeable to referral for counseling but needs to find out her sister's email first as she is unable to go to office to fill out intake paperwork. Patient prefers virtual counseling to save money on transportation. [facility] LCSW will successfully place referral to [facility] during next outreach. [facility] LCSW completed care coordination with [facility] BSW.  BSW 12/15/21: BSW completed a telephone outreach with patient. She stated everything is still going okay. She has been having problems with her leg wraps and has an appointment today. She states her husband is still having issues with his eye and with the doctor and she is going to make a compliant. Patient states no resources are needed at this time. [facility] LCSW 01/19/22: Patient reports that she was unable to sleep last night. She is experiencing a break out skin reaction that she feels is caused by stress. Her financial stressors have increased since last session. Patient is agreeable to [facility] LCSW placing referral for [facility] now but reports that she has no email address. [facility] LCSW completed referral and made note of her inability to get into her email. [facility] LCSW will follow up within two weeks to ensure that she was enrolled in [facility] counseling program. [facility] completed care coordination call ONLY to [facility] and they were unable to locate patient's referral that was made on 01/19/22. [facility] LCSW resubmitted this referral today on 01/29/22 for telephonic counseling. Patient was  made aware by telephone call that [facility] will contact her by the end of the day tomorrow to schedule a time for her to come into their office to complete in person intake paperwork. Patient is agreeable to this plan. UPDATE 02/03/22- Patient reports that she missed a call from June the receptionist at [facility] and call her back but has not heard back yet. [facility] LCSW provided patient with her number and directions on which prompt to enter to be directed to June. Patient will ask June about her phone as patient is unsure if she can make video calls and complete telephonic therapy which would mean she would need to go for in person sessions. Patient is agreeable to plan. Patient was able to find an assistance program for rent through  [facility] [employer] on her own. Positive reinforcement provided for this resource find. [facility] LCSW collaborated with entire team. [facility] LCSW will follow up in two weeks to ensure that patient was able to get connected with either telephonic or in person [facility]. UPDATE 02/23/22-Patient has decided to decline therapy at this time stating that she "just has too much going on to add in anything else into her schedule." [facility] LCSW will update team on this decision. Reflective and active listening provided during session. [facility] LCSW will follow up in 90 days to ask patient again about mental health support involvement. Patient is agreeable to this plan.  BSW completed a telephone outreach with patient. She stated she is still itching really bad and thinks she has shingles. She has put up her Christmas decorations and has 2 trees. Patient states she has been stressed about medical bills, but the holidays are keeping her in good spirits. Patient states they are still having financial issues.   Managing Loss, Adult People experience loss in many different ways throughout their lives. Events such as moving, changing jobs, and losing friends can create a sense of loss.  The loss may be as serious as a major health change, divorce, death of a pet, or death of a loved one. All of these types of loss are likely to create a physical and emotional reaction known as grief. Grief is the result of a major change or an absence of something or someone that you count on. Grief is a normal reaction to loss. A variety of factors can affect your grieving experience, including: The nature of your loss. Your relationship to what or whom you lost. Your understanding of grief and how to manage it. Your support system. How to manage lifestyle changes Keep to your normal routine as much as possible. If you have trouble focusing or doing normal activities, it is acceptable to take some time away from your normal routine. Spend time with friends and loved ones. Eat a healthy diet, get plenty of sleep, and rest when you feel tired. How to recognize changes  The way that you deal with your grief will affect your ability to function as you normally do. When grieving, you may experience these changes: Numbness, shock, sadness, anxiety, anger, denial, and guilt. Thoughts about death. Unexpected crying. A physical sensation of emptiness in your stomach. Problems sleeping and eating. Tiredness (fatigue). Loss of interest in normal activities. Dreaming about or imagining seeing the person who died. A need to remember what or whom you lost. Difficulty thinking about anything other than your loss for a period of time. Relief. If you have been expecting the loss for a while, you may feel a sense of relief when it happens. Follow these instructions at home:    Activity Express your feelings in healthy ways, such as: Talking with others about your loss. It may be helpful to find others who have had a similar loss, such as a support group. Writing down your feelings in a journal. Doing physical activities to release stress and emotional energy. Doing creative activities like painting,  sculpting, or playing or listening to music. Practicing resilience. This is the ability to recover and adjust after facing challenges. Reading some resources that encourage resilience may help you to learn ways to practice those behaviors. General instructions Be patient with yourself and others. Allow the grieving process to happen, and remember that grieving takes time. It is likely that you may never feel completely  done with some grief. You may find a way to move on while still cherishing memories and feelings about your loss. Accepting your loss is a process. It can take months or longer to adjust. Keep all follow-up visits as told by your health care provider. This is important. Where to find support To get support for managing loss: Ask your health care provider for help and recommendations, such as grief counseling or therapy. Think about joining a support group for people who are managing a loss. Where to find more information You can find more information about managing loss from: American Society of Clinical Oncology: www.cancer.net American Psychological Association: DiceTournament.ca Contact a health care provider if: Your grief is extreme and keeps getting worse. You have ongoing grief that does not improve. Your body shows symptoms of grief, such as illness. You feel depressed, anxious, or lonely. Get help right away if: You have thoughts about hurting yourself or others. If you ever feel like you may hurt yourself or others, or have thoughts about taking your own life, get help right away. You can go to your nearest emergency department or call: Your local emergency services (911 in the [place] A suicide crisis helpline, such as the National Suicide Prevention Lifeline at 563-182-5615. This is open 24 hours a day. Summary Grief is the result of a major change or an absence of someone or something that you count on. Grief is a normal reaction to loss. The depth of grief and the  period of recovery depend on the type of loss and your ability to adjust to the change and process your feelings. Processing grief requires patience and a willingness to accept your feelings and talk about your loss with people who are supportive. It is important to find resources that work for you and to realize that people experience grief differently. There is not one grieving process that works for everyone in the same way. Be aware that when grief becomes extreme, it can lead to more severe issues like isolation, depression, anxiety, or suicidal thoughts. Talk with your health care provider if you have any of these issues. This information is not intended to replace advice given to you by your health care provider. Make sure you discuss any questions you have with your health care provider. Document Revised: 06/17/2018 Document Reviewed: 08/27/2016 Elsevier Patient Education  2020 [employer].  Help for Managing Grief   When you are experiencing grief, many resources and support are available to help you. You are not alone.    Grief Share ([email or web address]://[email or web address]):    [employer] of [address] [place], [place]      [employer]  [phone] [address] [place], [place]     [employer] [address] [place], [place]      [facility] [address] [place], [place]  [phone]   [facility] [address] [place], [place] [phone]   [facility] [address] [place], [place]     [phone]   [facility] [address] [place], [place]     [phone]   If a Hospice or Palliative Care team has been involved in the care of your loved one, you may reach out to your contact there or locally, you may reach out to [facility]:    [facility] and Palliative Care of [facility]   [address] [place], [place] [address] [phone] Patient  was educated on healthy self-care as she admits she has little motivation to walk lately. PCS recommendation was suggested.   Patient Self Care Activities:  Attends all scheduled provider appointments Calls provider office for new concerns or questions  Please see past updates related to this goal by clicking on the "Past Updates" button in the selected goal

## 2022-03-04 NOTE — Patient Outreach (Signed)
Medicaid Managed Care Social Work Note  03/04/2022 Name:  Cheryl Chandler MRN:  [id number] DOB:  07-07-1965  Cheryl Chandler is an 56 y.o. year old female who is a primary patient of [name], NP.  The Medicaid Managed Care Coordination team was consulted for assistance with:  Community Resources   Cheryl Chandler was given information about Medicaid Managed Care Coordination team services today. [place] Patient agreed to services and verbal consent obtained.  Engaged with patient  for by telephone forfollow up visit in response to referral for case management and/or care coordination services.   Assessments/Interventions:  Review of past medical history, allergies, medications, health status, including review of consultants reports, laboratory and other test data, was performed as part of comprehensive evaluation and provision of chronic care management services.  SDOH: (Social Determinant of Health) assessments and interventions performed: [name]    Flowsheet Row Patient Outreach Telephone from 02/19/2022 in [place] Patient Outreach Telephone from 02/17/2022 in [place] Patient Outreach Telephone from 01/19/2022 in [place] Patient Outreach from 01/01/2022 in [place] Patient Outreach Telephone from 12/11/2021 in [place] Patient Outreach Telephone from 12/05/2021 in [place] Coordination  [name]        Food Insecurity Interventions -- -- -- -- -- Other (Comment)  [has resources]  Transportation Interventions -- -- -- Patient Resources Tax adviser) -- --  Utilities Interventions -- Intervention Not Indicated -- -- -- --  Alcohol Usage Interventions Intervention Not Indicated (Score <7) -- -- -- -- --   Financial Strain Interventions -- -- Intervention Not Indicated  [facility] BSW] Other (Comment)  [MM care team] -- --  Physical Activity Interventions -- Other (Comments)  [not physically able to engage in moderate to strenuous exercise] -- -- -- --  Stress Interventions -- -- [employer], Provide Counseling Provide Counseling Offered [employer], Provide Counseling --     BSW completed a telephone outreach with patient. She stated she is still itching really bad and thinks she has shingles. She has put up her Christmas decorations and has 2 trees. Patient states she has been stressed about medical bills, but the holidays are keeping her in good spirits. Patient states they are still having financial issues.   Advanced Directives Status:  Not addressed in this encounter.  Care Plan                 Allergies  Allergen Reactions   Codeine Shortness Of Breath and Rash   Percocet [Oxycodone-Acetaminophen] Shortness Of Breath   Sulfa Antibiotics Shortness Of Breath and Rash   Aspirin Hives   Bee Venom     Bee stings   Darvon [Propoxyphene]     Darvocet-N 100   Latex    Oxycodone Nausea And Vomiting   Penicillins     Has patient had a PCN reaction causing immediate rash, facial/tongue/throat swelling, SOB or lightheadedness with hypotension: Unknown Has patient had a PCN reaction causing severe rash involving mucus membranes or skin necrosis: Unknown Has patient had a PCN reaction that required hospitalization: Unknown Has patient had a PCN reaction occurring within the last 10 years: Unknown If all of the above answers are "NO", then may proceed with Cephalosporin use.    Medications Reviewed Today     Reviewed by [name], RN (Registered Nurse) on 02/19/22 at [phone]  Med List Status: <None>   Medication Order Taking? Sig Documenting Provider Last Dose Status Informant  ACCU-CHEK GUIDE test strip [id number] No USE TO CHECK BLOOD SUGAR 3 TIMES  DAILY AS DIRECTED [name], NP Taking Active   Accu-Chek Softclix Lancets lancets [id number] No CHECK BLOOD SUGAR TWICE DAILY [name], NP Taking Active   albuterol (PROVENTIL) (2.5 MG/3ML) 0.083% nebulizer solution [id number] No USE 1 VIAL VIA NEBULIZER EVERY 4 HOURS AS NEEDED [name], NP Taking Active   amLODipine (NORVASC) 5 MG tablet [id number] No TAKE 1 TABLET BY MOUTH ONCE DAILY [name], NP Taking Active   atorvastatin (LIPITOR) 80 MG tablet [id number] No TAKE 1 TABLET BY MOUTH ONCE EVERY [name], NP Taking Active   Blood Glucose Monitoring Suppl (ACCU-CHEK GUIDE) w/Device KIT [id number] No Use to check blood sugar 3 times a day. [name], NP Taking Active   Blood Pressure Monitoring (OMRON 3 SERIES BP MONITOR) DEVI [id number] No Use to check blood pressure as directed [name], NP Taking Active   citalopram (CELEXA) 40 MG tablet [id number] No TAKE 1 TABLET BY MOUTH ONCE DAILY [name], NP Taking Active   EPINEPHrine 0.3 mg/0.3 mL IJ SOAJ injection [id number] No INJECT 1 SYRINGE INTO OUTER THIGH ONCE AS NEEDED FOR SEVERE ALLERGIC REACTION. [name], NP Taking Active   Fluticasone-Umeclidin-Vilant (TRELEGY ELLIPTA) 100-62.5-25 MCG/ACT AEPB [id number]  Inhale 1 puff into the lungs daily. [name], MD  Active   Fluticasone-Umeclidin-Vilant [facility] ELLIPTA) 100-62.5-25 MCG/ACT AEPB [id number]  Inhale 1 puff into the lungs daily. [name], MD  Active   furosemide (LASIX) 20 MG tablet [id number] No TAKE 1 TABLET BY MOUTH ONCE DAILY IN AFTERNOON AS NEEDED LEG EDEMA [name], NP Taking Active   gabapentin (NEURONTIN) 300 MG capsule [id number] No TAKE 1 CAPSULE BY MOUTH 3 TIMES DAILY ASNEEDED (NEED OFFICE VISIT FOR FURTHER REFILLS) [name], NP Taking Active   hydrALAZINE (APRESOLINE) 25 MG tablet [id number] No TAKE 1 TABLET BY MOUTH 3 TIMES DAILY [name], NP Taking Active    levothyroxine (SYNTHROID) 50 MCG tablet [id number] No TAKE 1 TABLET BY MOUTH ONCE DAILY ON AN EMPTY STOMACH. WAIT 30 MINUTES BEFORE TAKING OTHER MEDS. [name], NP Taking Active   lisinopril (ZESTRIL) 40 MG tablet [id number] No TAKE 1 TABLET BY MOUTH ONCE DAILY [name], NP Taking Active   meloxicam (MOBIC) 15 MG tablet [id number] No TAKE 1 TABLET BY MOUTH ONCE DAILY AS NEEDED WITH FOOD OR MILK [name], NP Taking Active   methocarbamol (ROBAXIN) 500 MG tablet [id number] No Take 500 mg by mouth every 8 (eight) hours as needed for muscle spasms.  Patient not taking: Reported on 02/02/2022   [provider] Not Taking Active   montelukast (SINGULAIR) 10 MG tablet [id number] No TAKE 1 TABLET BY MOUTH AT BEDTIME [name], NP Taking Active   nitroGLYCERIN (NITROSTAT) 0.4 MG SL tablet [id number] No DISSOLVE 1 TABLET UNDER TONGUE AT ONSET OF CHEST PAIN. REPEAT IN 5 MIN IF NOT RESOLVED, MAX 3 DOSES. 911 IF NEEDED. [name], NP Taking Active   omeprazole (PRILOSEC) 20 MG capsule [id number] No TAKE 1 CAPSULE BY MOUTH ONCE DAILY [name], NP Taking Active   spironolactone (ALDACTONE) 25 MG tablet [id number] No Take 0.5 tablets (12.5 mg total) by mouth daily. [name], NP Taking Active   triamcinolone cream (KENALOG) 0.1 % [id number] No Apply 1 application. topically 2 (two) times daily. [name], NP Taking Active   triamcinolone cream (  KENALOG) 0.1 % [id number] No APPLY 1 APPLICATION TOPICALLY TWICE DAILY  Patient not taking: Reported on 02/02/2022   [name], NP Not Taking Active   TRULICITY 1.75 ZW/2.5EN SOPN [id number] No INJECT 0.75MG SUBQ ONCE A [name], NP Taking Active   Vitamin D, Ergocalciferol, (DRISDOL) 1.25 MG (50000 UNIT) CAPS capsule [id number] No TAKE 1 CAPSULE BY MOUTH EVERY 7 DAYS [name], NP Taking Active             Patient Active Problem List   Diagnosis Date Noted   Foot pain,  right 09/01/2021   Fibrocystic disease of both breasts 05/07/2019   IFG (impaired fasting glucose) 05/03/2019   Chronic venous insufficiency 11/09/2018   Overactive bladder 09/30/2018   Peripheral neuropathy 09/30/2018   Apnea 01/02/2016   Lymphedema 11/30/2014   Dyspnea on exertion 11/30/2014   Anxiety 11/29/2014   Arthritis 11/29/2014   COPD (chronic obstructive pulmonary disease) ([place]) 11/29/2014   Clinical depression 11/29/2014   H/O eating disorder 11/29/2014   Esophageal reflux 11/29/2014   Acne inversa 11/29/2014   Essential hypertension 11/29/2014   HLD (hyperlipidemia) 11/29/2014   Adult hypothyroidism 11/29/2014   Insomnia 11/29/2014   Low back pain 11/29/2014   Morbid obesity ([place]) 11/29/2014   Posttraumatic stress disorder 11/29/2014   Vitamin D deficiency 11/29/2014   Nicotine dependence, cigarettes, uncomplicated 00/86/7619    Conditions to be addressed/monitored per PCP order:   community resources  Care Plan : General Social Work (Adult)  Updates made by Ethelda Chick since 03/04/2022 12:00 AM     Problem: Coping Skills (General Plan of Care)      Long-Range Goal: Coping Skills Enhanced   Start Date: 11/07/2020  Expected End Date: 03/07/2022  Recent Progress: On track  Priority: High  Note:   Timeframe:  Long-Range Goal Priority:  High Start Date:       08/26/21                    Expected End Date:  ongoing   Current Barriers:  Financial constraints Mental Health Concerns  Social Isolation Limited education about mental health support resources that are available to her within the local area* Cognitive Deficits Lacks knowledge of community resource   Clinical Social Work Clinical Goal(s):  Over the next 90 days, client will work with SW to address concerns related to experiencing ongoing symptoms of depression, sadness and grief since the passing of her mother Over the next 90 days, patient will work with LCSW to address needs related to  implementing appropriate self-care and depression management.    Interventions: Patient interviewed and appropriate assessments performed Provided mental health counseling with regards to patient's loss and ongoing stressors. Patient was very close with her mother who passed away a few years ago and she continues to experience symptoms of grief from this loss. Patient denies wanting LCSW to make a referral for grief counseling through [facility] again but was receptive to coping skill and resource education/information that was provided during session today. Patient reports decorating for the holidays helps her to connect with her mother as they did this together when she was alive.  Provided patient with information about managing depression within her daily life. Patient has ongoing grief symptoms as well. Patient states "I feel worn down." Patient reports that she talks out loud to her mother's picture and this is very helpful to her. Grief support resource education provided again during session today.  Provided reflective listening and implemented  appropriate interventions to help support patient and her emotional needs  Discussed plans with patient for ongoing care management follow up and provided patient with direct contact information for [facility] team Advised patient to contact [facility] team with any urgent case management needs. Assisted patient/caregiver with obtaining information about health plan benefits LCSW completed past referral for family to have a wheelchair ramp built at their residence due to ongoing mobility concerns. Ramp has been successfully installed.  PCS actively involved with patient and providing ongoing services.  Positive reinforcement provided to patient for quitting smoking cigarettes. Coping skill review education provided for future triggers.  Brief self-care education provided to both patient and spouse. Patient confirms that she continues to go to food pantry for food  insecurity. Resource information has been provided.  Patient reports ongoing pain and issues with mobility. Patient shares that her pain resides in her pains and legs. Emotional support and coping skill education provided. Patient confirms that her PCS aide continues to provide her with services. However, aide has to be reminded to put on her mask since patient is high risk. LCSW reviewed resources that [name] provided her with in the past.  Patient reports that she had a wonderful birthday spent with her family this past May. Patient reports receiving appropriate socialization. Patient reports that her spouse took her out to eat and gifted her with several things which was a nice surprise.  Patient confirms stable transportation arrangements for upcoming PCP appointment this week.  Patient repots that she is losing a lot of weight but is not sure how much she has lost as she does not have a scale. Patient reports that a goal of hers is to lose weight but she has not been intentionally losing weight at this time.  Patient reports that her aide [name] continues to assist her with PCS and is an excellent support within the home for her and spouse.  Patient planted a tomato plant and successfully grew over 5 tomatoes over the summer. Patient is very proud of this achievement as this was her first time planting a tomato plant and is now interested in picking up gardening/planting as a hobby. Patient reports that her aunt continues to be a positive support for her in her life and someone that she can rely on in the case of emergencies.  Patient was educated on healthy self-care skills and advised her to use these into her daily routine.  04/10/21- Update- Patient is agreeable to social work closure at this time as she still does not need medication management or counseling as she is managing her mental health well at this time. Patient talks to her younger sister everyday to gain emotional support. Patient  says that her sister is into body building and helps to educate and remind her to do her own self-care. Patient is agreeable to contact [facility] LCSW directly if any social work concern arise. [facility] LCSW will complete case closure at this time.  Patient reports that she has been elevating her legs daily to elevate pain and swelling.  [facility] LCSW completed care coordination with Thomasene Lot on 01/15/21 and [facility] LCSW provided the following suggested resource-It is likely their best option given their loss of housing and lack of consistent income:   Publishing rights manager                                                                                                                       (  Emergency Crisis Housing) [phone] Ext. 104 M-F, 8am - 4pm Patient was successfully approved for food stamps but is still experiencing financial concerns. She reports that she and her husband have no clothes that fit them and they are having to start using a string for their pants and patient only has one bra at this time. [facility] LCSW will send referral to [facility] BSW today for financial support.  BSW spoke with patient about resources needed. Patient states she does receive SSI and was approved for foodstamps. Patient is familiar with all of the food pantries and other resources in [place]. BSW will assist patient with finding an eye doctor. Patient states she has not been in 10 years. BSW will research and put a list of eye doctors together that accept Medicaid in [facility]. 04/30/20: BSW contacted and spoke with patient, she stated she was not doing good because she found out that she has a new landlord and they are going up on her rent. BSW informed patient to make sure she informs her social workers at [employer] that they have an increase for foodstamps and Medicaid. Patient stated that she sleeps on the couch and is in need of a power recliner/lift chair. BSW will send patients PCP a message asking for a  script to be sent. 05/21/21: BSW completed follow up telephone call with patient. She states she is unsure if their new landlord has received their January rent. Patient states she is also worried due to the new landlord making them pay rent online and on the 1st of the month. Patient states she is unable to get anyone on the phone. Patient states her rent will go up to 400 from 255 in March. Patient states she does not have many clothes but goes to [employer] to get some Materials engineer. No other resources needed at this time.   06/20/21: BSW completed follow up telelphone call with patient. Patient states she is doing well. Patient states [employer] [place] will cover all of Vaccines. Patient states no resources are needed at this time. 08/26/21: BSW completed telephone outreach with patient, she stated since going to the hospital she has been feeling weak. Patient states other than that she is feeling okay. Patient stated she is going to get a new phone on Wednesday. Patient states her birthday is on Friday and her husband may take her to a [place]. No other resources are needed at this time.Update- Patient reports experiencing additional grief lately. [facility] LCSW offered referral for berievement counseling but she declined. UPDATE 08/26/21- Patient reports that she has a near death experience last month. Patient shares that she had a fall. 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 [facility] LCSW completed education on available mental health resources within her area to utilize. Patient admits to experiencing some sadness due to grief from her mother's passing. Emotional support provided. 10/13/21 UPDATE- Patient reports that she is working hard on living a healthier life both physically and mentally. Patient reports that she is making active changes in her diet. She reports that she eventually wishes to quit smoking again and has started picking up new hobbies to implement during times of craving such as  coloring, crocheting and being creative. Patient reports that she is unsure if she needs mental health support at this time because her main concerns are centered around her and her spouse's physical health instead their mental health. [facility] LCSW educated patient on how they affect one another and their importance. Patient denies any  urgent concerns. She reports no issues with affording food due to food stamp assistance. [facility] LCSW will reassess social work needs 30 days from now. UPDATE 11/17/21- Patient reports that she is experiencing several stressors at this time. She was encouraged to consider mental health treatment but she is not interested at this time. Patient was encouraged to consider mental health resources. Patient reports that their car is in the shop and that they have no stable transportation besides their sister and her husband's father. Patient reports that her spouse has had several recent health issues that have affected her mental state. Patient was educated on healthy coping skills for stress. Patient denies no urgent concerns or issues at this time. UPDATE- Care Coordination completed with [facility] LCSW, PCP office, Pharmacist, [facility] BSW and PCP. Patient has canceled several medical appointments because they are unable to afford a copay. Patient will benefit from financial assistance and may benefit from a [facility] application. Patient has an upcoming appointment with [facility] BSW next week. Patient is agreeable to exploring financial assistance options with [facility] BSW that will help alleviate other areas of financial stress within their life in order for patient to be able to afford medical expenses. [facility] LCSW encouraged patient to consider individual counseling. Patient is agreeable to referral for counseling but needs to find out her sister's email first as she is unable to go to office to fill out intake paperwork. Patient prefers virtual counseling to save money on transportation. [facility] LCSW will  successfully place referral to [facility] during next outreach. [facility] LCSW completed care coordination with [facility] BSW.  BSW 12/15/21: BSW completed a telephone outreach with patient. She stated everything is still going okay. She has been having problems with her leg wraps and has an appointment today. She states her husband is still having issues with his eye and with the doctor and she is going to make a compliant. Patient states no resources are needed at this time. [facility] LCSW 01/19/22: Patient reports that she was unable to sleep last night. She is experiencing a break out skin reaction that she feels is caused by stress. Her financial stressors have increased since last session. Patient is agreeable to [facility] LCSW placing referral for [facility] now but reports that she has no email address. [facility] LCSW completed referral and made note of her inability to get into her email. [facility] LCSW will follow up within two weeks to ensure that she was enrolled in [facility] counseling program. [facility] completed care coordination call ONLY to [facility] and they were unable to locate patient's referral that was made on 01/19/22. [facility] LCSW resubmitted this referral today on 01/29/22 for telephonic counseling. Patient was made aware by telephone call that [facility] will contact her by the end of the day tomorrow to schedule a time for her to come into their office to complete in person intake paperwork. Patient is agreeable to this plan. UPDATE 02/03/22- Patient reports that she missed a call from June the receptionist at [facility] and call her back but has not heard back yet. [facility] LCSW provided patient with her number and directions on which prompt to enter to be directed to June. Patient will ask June about her phone as patient is unsure if she can make video calls and complete telephonic therapy which would mean she would need to go for in person sessions. Patient is agreeable to plan. Patient  was able to find an assistance program for rent through  [employer] on her own. Positive reinforcement provided for this resource find. [facility] LCSW collaborated with entire team. [facility] LCSW will follow up in two weeks to ensure that patient was able to get connected with either telephonic or in person therapy services at [facility]. UPDATE 02/23/22-Patient has decided to decline therapy at this time stating that she "just has too much going on to add in anything else into her schedule." [facility] LCSW will update team on this decision. Reflective and active listening provided during session. [facility] LCSW will follow up in 90 days to ask patient again about mental health support involvement. Patient is agreeable to this plan.  BSW completed a telephone outreach with patient. She stated she is still itching really bad and thinks she has shingles. She has put up her Christmas decorations and has 2 trees. Patient states she has been stressed about medical bills, but the holidays are keeping her in good spirits. Patient states they are still having financial issues.   Managing Loss, Adult People experience loss in many different ways throughout their lives. Events such as moving, changing jobs, and losing friends can create a sense of loss. The loss may be as serious as a major health change, divorce, death of a pet, or death of a loved one. All of these types of loss are likely to create a physical and emotional reaction known as grief. Grief is the result of a major change or an absence of something or someone that you count on. Grief is a normal reaction to loss. A variety of factors can affect your grieving experience, including: The nature of your loss. Your relationship to what or whom you lost. Your understanding of grief and how to manage it. Your support system. How to manage lifestyle changes Keep to your normal routine as much as possible. If you have trouble focusing or doing normal activities, it is  acceptable to take some time away from your normal routine. Spend time with friends and loved ones. Eat a healthy diet, get plenty of sleep, and rest when you feel tired. How to recognize changes  The way that you deal with your grief will affect your ability to function as you normally do. When grieving, you may experience these changes: Numbness, shock, sadness, anxiety, anger, denial, and guilt. Thoughts about death. Unexpected crying. A physical sensation of emptiness in your stomach. Problems sleeping and eating. Tiredness (fatigue). Loss of interest in normal activities. Dreaming about or imagining seeing the person who died. A need to remember what or whom you lost. Difficulty thinking about anything other than your loss for a period of time. Relief. If you have been expecting the loss for a while, you may feel a sense of relief when it happens. Follow these instructions at home:    Activity Express your feelings in healthy ways, such as: Talking with others about your loss. It may be helpful to find others who have had a similar loss, such as a support group. Writing down your feelings in a journal. Doing physical activities to release stress and emotional energy. Doing creative activities like painting, sculpting, or playing or listening to music. Practicing resilience. This is the ability to recover and adjust after facing challenges. Reading some resources that encourage resilience may help you to learn ways to practice those behaviors. General instructions Be patient with yourself and others. Allow the grieving process to happen, and remember that grieving takes time. It is likely that you may never feel completely done  with some grief. You may find a way to move on while still cherishing memories and feelings about your loss. Accepting your loss is a process. It can take months or longer to adjust. Keep all follow-up visits as told by your health care provider. This is  important. Where to find support To get support for managing loss: Ask your health care provider for help and recommendations, such as grief counseling or therapy. Think about joining a support group for people who are managing a loss. Where to find more information You can find more information about managing loss from: American Society of Clinical Oncology: www.cancer.net American Psychological Association: TVStereos.ch Contact a health care provider if: Your grief is extreme and keeps getting worse. You have ongoing grief that does not improve. Your body shows symptoms of grief, such as illness. You feel depressed, anxious, or lonely. Get help right away if: You have thoughts about hurting yourself or others. If you ever feel like you may hurt yourself or others, or have thoughts about taking your own life, get help right away. You can go to your nearest emergency department or call: Your local emergency services (911 in the [place] A suicide crisis helpline, such as the [facility] at 804-193-4948. This is open 24 hours a day. Summary Grief is the result of a major change or an absence of someone or something that you count on. Grief is a normal reaction to loss. The depth of grief and the period of recovery depend on the type of loss and your ability to adjust to the change and process your feelings. Processing grief requires patience and a willingness to accept your feelings and talk about your loss with people who are supportive. It is important to find resources that work for you and to realize that people experience grief differently. There is not one grieving process that works for everyone in the same way. Be aware that when grief becomes extreme, it can lead to more severe issues like isolation, depression, anxiety, or suicidal thoughts. Talk with your health care provider if you have any of these issues. This information is not intended to replace advice  given to you by your health care provider. Make sure you discuss any questions you have with your health care provider. Document Revised: 06/17/2018 Document Reviewed: 08/27/2016 Elsevier Patient Education  [place] for Managing Grief   When you are experiencing grief, many resources and support are available to help you. You are not alone.    Grief Share (https://www.https://jacobson-moore.net/):    [employer] [facility] [place], [place]  [phone], [place], [place], [place], [place]   [facility] [address] [place], [place]   [place], [place]   If a Hospice or [place] team has been involved in the care of your loved one, you may reach out to your contact there or locally, you may reach out to [facility]:    [facility] [place] [facility]   [place], [place] [phone]  [phone] Patient was educated on healthy self-care as she admits she has little motivation to walk lately. PCS recommendation was suggested.   Patient Self Care Activities:  Attends all scheduled provider appointments Calls provider office for new concerns or questions  Please see past updates related to this goal by clicking on the "Past Updates" button in the selected goal        Follow up:  Patient agrees to Care Plan and Follow-up.  Plan: The Managed Medicaid care management team will reach out to the patient again over the next 30 days.  Date/time of next scheduled Social Work care management/care coordination outreach:  04/03/22  Mickel Fuchs, Arita Miss, [place] Managed Medicaid Team  [phone]

## 2022-03-09 ENCOUNTER — Ambulatory Visit: Payer: Medicaid Other | Admitting: Cardiovascular Disease

## 2022-03-09 NOTE — Progress Notes (Deleted)
LMP 06/09/2017 (Approximate)    Subjective:    Patient ID: Cheryl Chandler, female    DOB: Jan 02, 1966, 56 y.o.   MRN: [id number]  HPI: Cheryl Chandler is a 56 y.o. female  No chief complaint on file.  HYPERTENSION / HYPERLIPIDEMIA Satisfied with current treatment? yes Duration of hypertension: years BP monitoring frequency: daily BP range: 100/60 BP medication side effects: no Past BP meds:  lasix and lisinopril, hydralazine Duration of hyperlipidemia: years Cholesterol medication side effects: no Cholesterol supplements: none Past cholesterol medications: simvastatin (zocor) Medication compliance: excellent compliance Aspirin: no Recent stressors: no Recurrent headaches: no Visual changes: no Palpitations: no Dyspnea: yes Chest pain: no Lower extremity edema: yes Dizzy/lightheaded: no  COPD COPD status: controlled Satisfied with current treatment?: yes Oxygen use: no Dyspnea frequency: yes Cough frequency: daily Rescue inhaler frequency: has cut back from 4-3 times daily Limitation of activity: yes Productive cough:  Last Spirometry:  Pneumovax: Up to Date Influenza: Up to Date  HYPOTHYROIDISM Thyroid control status:controlled Satisfied with current treatment? yes Medication side effects: no Medication compliance: excellent compliance Etiology of hypothyroidism:  Recent dose adjustment:no Fatigue: yes Cold intolerance: no Heat intolerance: no Weight gain: no Weight loss: no Constipation: no Diarrhea/loose stools: no Palpitations: no Lower extremity edema: yes Anxiety/depressed mood: no  DEPRESSION/ANXIETY Patient states she feels like her mood is doing okay.  She is worried about her husband due to his recent eye infection.  States she has been a little feisty lately.  She does have days when she is sad from losing people in her life.  Has a lot going on in her life that is causing her to worry a lot.  Denies SI.  [place] [facility]  Visit from 12/16/2021 in [place]  PHQ-9 Total Score 10        12/16/2021   11:39 AM 10/22/2021    2:28 PM 09/18/2021    2:37 PM 07/02/2021    1:15 PM  GAD 7 : Generalized Anxiety Score  Nervous, Anxious, on Edge _0 0  Control/stop worrying _1 Worry too much - different things _2 Trouble relaxing _3 Restless 0 1 0 0  Easily annoyed or irritable 0 2 0 0  Afraid - awful might happen 0 3 2 0  Total GAD 7 Score _4 Anxiety Difficulty Somewhat difficult Very difficult Not difficult at all Somewhat difficult      Relevant past medical, surgical, family and social history reviewed and updated as indicated. Interim medical history since our last visit reviewed. Allergies and medications reviewed and updated.  Review of Systems  Constitutional:  Negative for fever and unexpected weight change.  Eyes:  Negative for visual disturbance.  Respiratory:  Negative for cough, chest tightness and shortness of breath.   Cardiovascular:  Positive for leg swelling. Negative for chest pain and palpitations.  Endocrine: Negative for cold intolerance.  Neurological:  Negative for dizziness and headaches.  Psychiatric/Behavioral:  Positive for dysphoric mood. Negative for suicidal ideas. The patient is nervous/anxious.     Per HPI unless specifically indicated above     Objective:    LMP 06/09/2017 (Approximate)   Wt Readings from Last 3 Encounters:  02/02/22 (!) 320 lb (145.2 kg)  07/28/21 (!) 320 lb (145.2 kg)  06/04/21 (!) 331 lb (150.1 kg)    Physical Exam Vitals and nursing note reviewed.  Constitutional:  General: She is not in acute distress.    Appearance: Normal appearance. She is normal weight. She is not ill-appearing, toxic-appearing or diaphoretic.  HENT:     Head: Normocephalic.     Right Ear: External ear normal.     Left Ear: External ear normal.     Nose: Nose normal.     Mouth/Throat:     Mouth: Mucous membranes are moist.      Pharynx: Oropharynx is clear.  Eyes:     General:        Right eye: No discharge.        Left eye: No discharge.     Extraocular Movements: Extraocular movements intact.     Conjunctiva/sclera: Conjunctivae normal.     Pupils: Pupils are equal, round, and reactive to light.  Cardiovascular:     Rate and Rhythm: Normal rate and regular rhythm.     Heart sounds: No murmur heard. Pulmonary:     Effort: Pulmonary effort is normal. No respiratory distress.     Breath sounds: Decreased breath sounds present. No wheezing or rales.  Musculoskeletal:     Cervical back: Normal range of motion and neck supple.     Right lower leg: Edema present.     Left lower leg: Edema present.     Comments: Swelling has improved some from last visit.   Skin:    General: Skin is warm and dry.     Capillary Refill: Capillary refill takes less than 2 seconds.  Neurological:     General: No focal deficit present.     Mental Status: She is alert and oriented to person, place, and time. Mental status is at baseline.  Psychiatric:        Mood and Affect: Mood normal.        Behavior: Behavior normal.        Thought Content: Thought content normal.        Judgment: Judgment normal.    Results for orders placed or performed in visit on 82/50/03  Basic Metabolic Panel (BMET)  Result Value Ref Range   Glucose 104 (H) 70 - 99 mg/dL   BUN 20 6 - 24 mg/dL   Creatinine, Ser 0.87 0.57 - 1.00 mg/dL   eGFR 79 >59 mL/min/1.73   BUN/Creatinine Ratio 23 9 - 23   Sodium 142 134 - 144 mmol/L   Potassium 4.2 3.5 - 5.2 mmol/L   Chloride 104 96 - 106 mmol/L   CO2 23 20 - 29 mmol/L   Calcium 8.7 8.7 - 10.2 mg/dL      Assessment & Plan:   Problem List Items Addressed This Visit      Cardiovascular and Mediastinum   Essential hypertension   Chronic venous insufficiency     Respiratory   COPD (chronic obstructive pulmonary disease) (HCC) - Primary     Endocrine   Adult hypothyroidism   IFG (impaired  fasting glucose)     Other   Anxiety   Clinical depression   HLD (hyperlipidemia)   Vitamin D deficiency     Follow up plan: No follow-ups on file.

## 2022-03-10 ENCOUNTER — Ambulatory Visit: Payer: Medicaid Other | Admitting: Nurse Practitioner

## 2022-03-10 DIAGNOSIS — R3981 Functional urinary incontinence: Secondary | ICD-10-CM | POA: Diagnosis not present

## 2022-03-10 DIAGNOSIS — Z993 Dependence on wheelchair: Secondary | ICD-10-CM | POA: Diagnosis not present

## 2022-03-16 ENCOUNTER — Other Ambulatory Visit: Payer: Medicaid Other | Admitting: Pharmacist

## 2022-03-16 NOTE — Progress Notes (Signed)
03/16/2022 Name: Cheryl Chandler MRN: [id number] DOB: 02/09/1966  Chief Complaint  Patient presents with   Medication Management   Hypertension    Cheryl Chandler is a 56 y.o. year old female who presented for a telephone visit.   They were referred to the pharmacist by their Case Management Team  for assistance in managing complex medication management.   Patient is participating in a Managed Medicaid Plan:  Yes  Subjective:  Care Team: Primary Care Provider: [name], NP ; Next Scheduled Visit: 05/01/22  Medication Access/Adherence  Current Pharmacy:  [facility], [place] [address] Phone: [phone] Fax: [phone]   Patient reports affordability concerns with their medications: No  Patient reports access/transportation concerns to their pharmacy: No  Patient reports adherence concerns with their medications:  No    Does report she cannot afford medical copays.    Hypertension:  Current medications: lisinopril 40 mg daily, hydralazine 25 mg three times daily, amlodipine 5 mg daily, spironolactone 12.5 mg daily   Is mostly concerned today about a rash. Reports a dry, itchy rash that is on her face, arms, shoulder, back, hands. Reports it was first apparent when she saw PCP in May and received a prescription for triamcinolone. Now reports it did not improve significantly.   Reports she did get some new furniture from her sister a few weeks ago, but itching preceded that.   Reports that she scheduled her ECHO as ordered by Dr. Patsey Berthold, but that the scheduler was worried about her rash so they scheduled it in January. Per phone note, she declined to schedule sleep study.   Health Maintenance  Health Maintenance Due  Topic Date Due   Diabetic kidney evaluation - Urine ACR  Never done   PAP SMEAR-Modifier  Never done   Lung Cancer Screening  Never done   MAMMOGRAM  Never done   COVID-19  Vaccine (4 - Moderna risk series) 12/31/2020   INFLUENZA VACCINE  11/25/2021     Objective: Lab Results  Component Value Date   HGBA1C 5.8 (H) 09/18/2021    Lab Results  Component Value Date   CREATININE 0.87 12/16/2021   BUN 20 12/16/2021   NA 142 12/16/2021   K 4.2 12/16/2021   CL 104 12/16/2021   CO2 23 12/16/2021    Lab Results  Component Value Date   CHOL 130 09/18/2021   HDL 33 (L) 09/18/2021   LDLCALC 78 09/18/2021   TRIG 99 09/18/2021   CHOLHDL 3.9 09/18/2021    Medications Reviewed Today     Reviewed by [name], RN (Registered Nurse) on 02/19/22 at [place] List Status: <None>   Medication Order Taking? Sig Documenting Provider Last Dose Status Informant  ACCU-CHEK GUIDE test strip [id number] No USE TO CHECK BLOOD SUGAR 3 TIMES DAILY AS DIRECTED [name], NP Taking Active   Accu-Chek Softclix Lancets lancets [id number] No CHECK BLOOD SUGAR TWICE DAILY [name], NP Taking Active   albuterol (PROVENTIL) (2.5 MG/3ML) 0.083% nebulizer solution [id number] No USE 1 VIAL VIA NEBULIZER EVERY 4 HOURS AS NEEDED [name], NP Taking Active   amLODipine (NORVASC) 5 MG tablet [id number] No TAKE 1 TABLET BY MOUTH ONCE DAILY [name], NP Taking Active   atorvastatin (LIPITOR) 80 MG tablet [id number] No TAKE 1 TABLET BY MOUTH ONCE EVERY [name], NP Taking Active   Blood Glucose Monitoring Suppl (ACCU-CHEK GUIDE) w/Device KIT [id number] No  Use to check blood sugar 3 times a day. [name], NP Taking Active   Blood Pressure Monitoring (OMRON 3 SERIES BP MONITOR) DEVI [id number] No Use to check blood pressure as directed [name], NP Taking Active   citalopram (CELEXA) 40 MG tablet [id number] No TAKE 1 TABLET BY MOUTH ONCE DAILY [name], NP Taking Active   EPINEPHrine 0.3 mg/0.3 mL IJ SOAJ injection [id number] No INJECT 1 SYRINGE INTO OUTER THIGH ONCE AS NEEDED FOR SEVERE ALLERGIC REACTION. [name], NP Taking Active   Fluticasone-Umeclidin-Vilant (TRELEGY ELLIPTA) 100-62.5-25 MCG/ACT AEPB [id number]  Inhale 1 puff into the lungs daily. [name], MD  Active   Fluticasone-Umeclidin-Vilant [facility] ELLIPTA) 100-62.5-25 MCG/ACT AEPB [id number]  Inhale 1 puff into the lungs daily. [name], MD  Active   furosemide (LASIX) 20 MG tablet [id number] No TAKE 1 TABLET BY MOUTH ONCE DAILY IN AFTERNOON AS NEEDED LEG EDEMA [name], NP Taking Active   gabapentin (NEURONTIN) 300 MG capsule [id number] No TAKE 1 CAPSULE BY MOUTH 3 TIMES DAILY ASNEEDED (NEED OFFICE VISIT FOR FURTHER REFILLS) [name], NP Taking Active   hydrALAZINE (APRESOLINE) 25 MG tablet [id number] No TAKE 1 TABLET BY MOUTH 3 TIMES DAILY [name], NP Taking Active   levothyroxine (SYNTHROID) 50 MCG tablet [id number] No TAKE 1 TABLET BY MOUTH ONCE DAILY ON AN EMPTY STOMACH. WAIT 30 MINUTES BEFORE TAKING OTHER MEDS. [name], NP Taking Active   lisinopril (ZESTRIL) 40 MG tablet [id number] No TAKE 1 TABLET BY MOUTH ONCE DAILY [name], NP Taking Active   meloxicam (MOBIC) 15 MG tablet [id number] No TAKE 1 TABLET BY MOUTH ONCE DAILY AS NEEDED WITH FOOD OR MILK [name], NP Taking Active   methocarbamol (ROBAXIN) 500 MG tablet [id number] No Take 500 mg by mouth every 8 (eight) hours as needed for muscle spasms.  Patient not taking: Reported on 02/02/2022   [provider] Not Taking Active   montelukast (SINGULAIR) 10 MG tablet [id number] No TAKE 1 TABLET BY MOUTH AT BEDTIME [name], NP Taking Active   nitroGLYCERIN (NITROSTAT) 0.4 MG SL tablet [id number] No DISSOLVE 1 TABLET UNDER TONGUE AT ONSET OF CHEST PAIN. REPEAT IN 5 MIN IF NOT RESOLVED, MAX 3 DOSES. 911 IF NEEDED. [name], NP Taking Active   omeprazole (PRILOSEC) 20 MG capsule [id number] No TAKE 1 CAPSULE BY MOUTH ONCE DAILY [name], NP Taking Active   spironolactone (ALDACTONE) 25 MG  tablet [id number] No Take 0.5 tablets (12.5 mg total) by mouth daily. [name], NP Taking Active   triamcinolone cream (KENALOG) 0.1 % [id number] No Apply 1 application. topically 2 (two) times daily. [name], NP Taking Active   triamcinolone cream (KENALOG) 0.1 % [id number] No APPLY 1 APPLICATION TOPICALLY TWICE DAILY  Patient not taking: Reported on 02/02/2022   [name], NP Not Taking Active   TRULICITY 9.50 DT/2.6ZT SOPN [id number] No INJECT 0.75MG SUBQ ONCE A [name], NP Taking Active   Vitamin D, Ergocalciferol, (DRISDOL) 1.25 MG (50000 UNIT) CAPS capsule [id number] No TAKE 1 CAPSULE BY MOUTH EVERY 7 DAYS [name], NP Taking Active             [name] Today    Flowsheet Row Most Recent Value  [name]   Financial Strain Interventions Other (Comment)  [working with BSW]       Assessment/Plan:   Hypertension: - Currently controlled - Collaborated with PCP. If same rash from May, unlikely to be contagious. Encouraged to  avoid any new, scented cleaners, to avoid hot showers, and can use non-scented dry skin creams. Encouraged to call the office to schedule to be seen for the rash. Patient was hesitant, as she notes she is having a difficult time with medical debt. Encouraged patient to continue to collaborate with social work resources.  - Encouraged the importance of keeping cardiology appointment, as well as ECHO. Reviewed that sleep study would also be beneficial.    Follow Up Plan: BP controlled and patient able to manage chronic medications. Closing pharmacy case, continue to follow with case management team  Catie TJodi Mourning, PharmD, [facility] [phone]

## 2022-03-17 ENCOUNTER — Ambulatory Visit (INDEPENDENT_AMBULATORY_CARE_PROVIDER_SITE_OTHER): Payer: Medicaid Other | Admitting: Physician Assistant

## 2022-03-17 ENCOUNTER — Ambulatory Visit: Payer: Self-pay | Admitting: *Deleted

## 2022-03-17 ENCOUNTER — Encounter: Payer: Self-pay | Admitting: Physician Assistant

## 2022-03-17 VITALS — BP 151/71 | HR 73 | Temp 98.4°F

## 2022-03-17 DIAGNOSIS — L299 Pruritus, unspecified: Secondary | ICD-10-CM | POA: Diagnosis not present

## 2022-03-17 DIAGNOSIS — R21 Rash and other nonspecific skin eruption: Secondary | ICD-10-CM

## 2022-03-17 MED ORDER — PREDNISONE 10 MG PO TABS: ORAL_TABLET | ORAL | 0 refills | Status: DC

## 2022-03-17 MED ORDER — HYDROXYZINE PAMOATE 25 MG PO CAPS: 25.0000 mg | ORAL_CAPSULE | Freq: Three times a day (TID) | ORAL | 0 refills | Status: AC | PRN

## 2022-03-17 NOTE — Telephone Encounter (Signed)
Reason for Disposition  SEVERE itching (i.e., interferes with sleep, normal activities or school)  Answer Assessment - Initial Assessment Questions 1. APPEARANCE of RASH: "Describe the rash." (e.g., spots, blisters, raised areas, skin peeling, scaly)     Red,small little bumps 2. SIZE: "How big are the spots?" (e.g., tip of pen, eraser, coin; inches, centimeters)     Tip of pen, varies 3. LOCATION: "Where is the rash located?"     Arms, hands, back, legs, stomach and chest, side of face 4. COLOR: "What color is the rash?" (Note: It is difficult to assess rash color in people with darker-colored skin. When this situation occurs, simply ask the caller to describe what they see.)     red 5. ONSET: "When did the rash begin?"     Months, worsening 6. FEVER: "Do you have a fever?" If Yes, ask: "What is your temperature, how was it measured, and when did it start?"    No 7. ITCHING: "Does the rash itch?" If Yes, ask: "How bad is the itch?" (Scale 1-10; or mild, moderate, severe)     Severe 8. CAUSE: "What do you think is causing the rash?"     Unsure 9. MEDICINE FACTORS: "Have you started any new medicines within the last 2 weeks?" (e.g., antibiotics)      no 10. OTHER SYMPTOMS: "Do you have any other symptoms?" (e.g., dizziness, headache, sore throat, joint pain) None  Protocols used: Rash or Redness - [facility]

## 2022-03-17 NOTE — Progress Notes (Signed)
Acute Office Visit   Patient: Cheryl Chandler   DOB: 1966/03/29   56 y.o. Female  MRN: [id number] Visit Date: 03/17/2022  Today's healthcare provider: Dani Gobble Anahlia Iseminger, PA-C  Introduced myself to the patient as a Journalist, newspaper and provided education on APPs in clinical practice.    Chief Complaint  Patient presents with   Rash    Pt states she has had a rash for a few weeks. States the rash is spreading and is itching a lot.    Subjective    HPI HPI     Rash    Additional comments: Pt states she has had a rash for a few weeks. States the rash is spreading and is itching a lot.       Last edited by Georgina Peer, CMA on 03/17/2022 10:35 AM.        Lehman Prom Reports she has noticed roaches in her home recently - they are treating home for this  Duration:  weeks  Location: generalized  Itching: yes Burning: yes Redness: yes Oozing: no Scaling: no Blisters: no Painful: yes Fevers: no Change in detergents/soaps/personal care products: yes- reports switch to gain recently  Recent illness: unsure Recent travel:no History of same: no Context: worse Alleviating factors: nothing Treatments attempted:aloe, alcohol and hydrocortisone cream Shortness of breath: this is chronic but reports she feels like her SOB has been worse   Throat/tongue swelling: no Myalgias/arthralgias: no new/acute body aches or joint pains  Reports nausea today  Denies similar rash or symptoms in others in her home - her husband denies similar rash or itching during duration of rash    Medications: Outpatient Medications Prior to Visit  Medication Sig   ACCU-CHEK GUIDE test strip USE TO CHECK BLOOD SUGAR 3 TIMES DAILY AS DIRECTED   Accu-Chek Softclix Lancets lancets CHECK BLOOD SUGAR TWICE DAILY   albuterol (PROVENTIL) (2.5 MG/3ML) 0.083% nebulizer solution USE 1 VIAL VIA NEBULIZER EVERY 4 HOURS AS NEEDED   amLODipine (NORVASC) 5 MG tablet TAKE 1 TABLET BY MOUTH ONCE DAILY   atorvastatin  (LIPITOR) 80 MG tablet TAKE 1 TABLET BY MOUTH ONCE EVERY EVENING   Blood Glucose Monitoring Suppl (ACCU-CHEK GUIDE) w/Device KIT Use to check blood sugar 3 times a day.   Blood Pressure Monitoring (OMRON 3 SERIES BP MONITOR) DEVI Use to check blood pressure as directed   citalopram (CELEXA) 40 MG tablet TAKE 1 TABLET BY MOUTH ONCE DAILY   EPINEPHrine 0.3 mg/0.3 mL IJ SOAJ injection INJECT 1 SYRINGE INTO OUTER THIGH ONCE AS NEEDED FOR SEVERE ALLERGIC REACTION.   Fluticasone-Umeclidin-Vilant (TRELEGY ELLIPTA) 100-62.5-25 MCG/ACT AEPB Inhale 1 puff into the lungs daily.   Fluticasone-Umeclidin-Vilant (TRELEGY ELLIPTA) 100-62.5-25 MCG/ACT AEPB Inhale 1 puff into the lungs daily.   furosemide (LASIX) 20 MG tablet TAKE 1 TABLET BY MOUTH ONCE DAILY IN AFTERNOON AS NEEDED LEG EDEMA   gabapentin (NEURONTIN) 300 MG capsule TAKE 1 CAPSULE BY MOUTH 3 TIMES DAILY ASNEEDED (NEED OFFICE VISIT FOR FURTHER REFILLS)   hydrALAZINE (APRESOLINE) 25 MG tablet TAKE 1 TABLET BY MOUTH 3 TIMES DAILY   levothyroxine (SYNTHROID) 50 MCG tablet TAKE 1 TABLET BY MOUTH ONCE DAILY ON AN EMPTY STOMACH. WAIT 30 MINUTES BEFORE TAKING OTHER MEDS.   lisinopril (ZESTRIL) 40 MG tablet TAKE 1 TABLET BY MOUTH ONCE DAILY   meloxicam (MOBIC) 15 MG tablet TAKE 1 TABLET BY MOUTH ONCE DAILY AS NEEDED WITH FOOD OR MILK   montelukast (SINGULAIR) 10 MG tablet  TAKE 1 TABLET BY MOUTH AT BEDTIME   nitroGLYCERIN (NITROSTAT) 0.4 MG SL tablet DISSOLVE 1 TABLET UNDER TONGUE AT ONSET OF CHEST PAIN. REPEAT IN 5 MIN IF NOT RESOLVED, MAX 3 DOSES. 911 IF NEEDED.   omeprazole (PRILOSEC) 20 MG capsule TAKE 1 CAPSULE BY MOUTH ONCE DAILY   TRULICITY 8.59 YT/2.4MQ SOPN INJECT 0.75MG SUBQ ONCE A WEEK   Vitamin D, Ergocalciferol, (DRISDOL) 1.25 MG (50000 UNIT) CAPS capsule TAKE 1 CAPSULE BY MOUTH EVERY 7 DAYS   spironolactone (ALDACTONE) 25 MG tablet Take 0.5 tablets (12.5 mg total) by mouth daily. (Patient not taking: Reported on 03/17/2022)   [DISCONTINUED]  methocarbamol (ROBAXIN) 500 MG tablet Take 500 mg by mouth every 8 (eight) hours as needed for muscle spasms. (Patient not taking: Reported on 02/02/2022)   [DISCONTINUED] triamcinolone cream (KENALOG) 0.1 % Apply 1 application. topically 2 (two) times daily.   [DISCONTINUED] triamcinolone cream (KENALOG) 0.1 % APPLY 1 APPLICATION TOPICALLY TWICE DAILY (Patient not taking: Reported on 02/02/2022)   No facility-administered medications prior to visit.    Review of Systems  Skin:  Positive for rash.       Objective    BP (!) 151/71   Pulse 73   Temp 98.4 F (36.9 C) (Oral)   LMP 06/09/2017 (Approximate)   SpO2 96%    Physical Exam Vitals reviewed.  Constitutional:      General: She is awake.     Appearance: Normal appearance. She is well-developed and well-groomed.  HENT:     Head: Normocephalic and atraumatic.  Pulmonary:     Effort: Pulmonary effort is normal.  Skin:    General: Skin is warm.     Findings: Rash present. Rash is macular. Rash is not crusting or vesicular.     Comments: Generalized macular rash over hand, arms, legs, and trunk with numerous excoriations  Examination of back revealed numerous erythematous papular lesions No drainage, swelling or streaking but numerous scabs and some evidence of bleeding all over   Neurological:     General: No focal deficit present.     Mental Status: She is alert and oriented to person, place, and time. Mental status is at baseline.  Psychiatric:        Mood and Affect: Mood normal.        Behavior: Behavior normal. Behavior is cooperative.        Thought Content: Thought content normal.        Judgment: Judgment normal.       No results found for any visits on 03/17/22.  Assessment & Plan      No follow-ups on file.      Problem List Items Addressed This Visit   None Visit Diagnoses     Rash and nonspecific skin eruption    -  Primary Acute, new concern, ongoing Reports this has been ongoing for several  weeks but was not able to pinpoint a definitive duration Reports itching and PE reveals numerous excoriations Will start a prolonged Prednisone taper to assist with calming down potential allergic reaction Will provide Hydroxyzine to assist with itching and picking- recommend topicals such as Cortisone, Benadryl cream PRN for further relief Recommend going back to previous detergent or getting sensitive skin formulations as well as monitoring diet for potential triggers If not improving with these measures, may need to send her to Dermatology or Allergy specialty for further diagnostic assistance and management     Relevant Medications   predniSONE (DELTASONE) 10 MG tablet  hydrOXYzine (VISTARIL) 25 MG capsule   Itching       Relevant Medications   hydrOXYzine (VISTARIL) 25 MG capsule        No follow-ups on file.   I, Laikynn Pollio E Eladio Dentremont, PA-C, have reviewed all documentation for this visit. The documentation on 03/17/22 for the exam, diagnosis, procedures, and orders are all accurate and complete.   Talitha Givens, MHS, PA-C [facility]

## 2022-03-17 NOTE — Telephone Encounter (Signed)
  Chief Complaint: Rash Symptoms: Worsening rash, widespread, severe itching. Frequency: "Months" Pertinent Negatives: Patient denies SOB, fever, swelling Disposition: [] ED /[] Urgent Care (no appt availability in office) / [x] Appointment(In office/virtual)/ []  [place] [facility]/ [] Home Care/ [] Refused Recommended Disposition /[] [place] [facility]/ []  Follow-up with PCP Additional Notes: Appt secured for today. Care advise provided, pt verbalizes understanding.

## 2022-03-18 ENCOUNTER — Other Ambulatory Visit: Payer: Medicaid Other | Admitting: Obstetrics and Gynecology

## 2022-03-18 ENCOUNTER — Encounter: Payer: Self-pay | Admitting: Obstetrics and Gynecology

## 2022-03-18 NOTE — Patient Outreach (Signed)
Medicaid Managed Care   Nurse Care Manager Note  03/18/2022 Name:  Cheryl Chandler MRN:  [id number] DOB:  Jul 24, 1965  Cheryl Chandler is an 56 y.o. year old female who is a primary patient of [name], NP.  The [facility] Managed Care Coordination team was consulted for assistance with:    Chronic healthcare management needs, HTN, DM, COPD, anxiety/depression, LBP, GERD, lymphedema, HLD, sleep apnea  Cheryl Chandler was given information about Medicaid Managed Care Coordination team services today. [place] Patient agreed to services and verbal consent obtained.  Engaged with patient by telephone for follow up visit in response to provider referral for case management and/or care coordination services.   Assessments/Interventions:  Review of past medical history, allergies, medications, health status, including review of consultants reports, laboratory and other test data, was performed as part of comprehensive evaluation and provision of chronic care management services.  SDOH (Social Determinants of Health) assessments and interventions performed: [name]    Flowsheet Row Patient Outreach Telephone from 03/18/2022 in [place] Office Visit from 03/17/2022 in [place] Patient Outreach from 03/16/2022 in [place] Patient Outreach Telephone from 02/19/2022 in [place] Coordination Patient Outreach Telephone from 02/17/2022 in [place] Patient Outreach Telephone from 01/19/2022 in [place] Coordination  [name]        Housing Interventions Intervention Not Indicated -- -- -- -- --  Utilities Interventions -- -- -- -- Intervention Not Indicated --  Alcohol Usage Interventions -- -- -- Intervention Not Indicated (Score <7) -- --  Depression Interventions/Treatment   -- Medication, Currently on Treatment -- -- -- --  Financial Strain Interventions -- -- Other (Comment)  [working with BSW] -- -- Intervention Not Indicated  [facility] BSW]  Physical Activity Interventions -- -- -- -- Other (Comments)  [not physically able to engage in moderate to strenuous exercise] --  Stress Interventions -- -- -- -- -- [employer], Provide Counseling  Social Connections Interventions Intervention Not Indicated -- -- -- -- --     Care Plan  Allergies  Allergen Reactions   Codeine Shortness Of Breath and Rash   Percocet [Oxycodone-Acetaminophen] Shortness Of Breath   Sulfa Antibiotics Shortness Of Breath and Rash   Aspirin Hives   Bee Venom     Bee stings   Darvon [Propoxyphene]     Darvocet-N 100   Latex    Oxycodone Nausea And Vomiting   Penicillins     Has patient had a PCN reaction causing immediate rash, facial/tongue/throat swelling, SOB or lightheadedness with hypotension: Unknown Has patient had a PCN reaction causing severe rash involving mucus membranes or skin necrosis: Unknown Has patient had a PCN reaction that required hospitalization: Unknown Has patient had a PCN reaction occurring within the last 10 years: Unknown If all of the above answers are "NO", then may proceed with Cephalosporin use.   Medications Reviewed Today     Reviewed by [name], RN (Registered Nurse) on 03/18/22 at 1245  Med List Status: <None>   Medication Order Taking? Sig Documenting Provider Last Dose Status Informant  ACCU-CHEK GUIDE test strip [id number] No USE TO CHECK BLOOD SUGAR 3 TIMES DAILY AS DIRECTED [name], NP Taking Active   Accu-Chek Softclix Lancets lancets [id number] No CHECK BLOOD SUGAR TWICE DAILY [name], NP Taking Active   albuterol (PROVENTIL) (2.5 MG/3ML) 0.083% nebulizer solution [id number] No USE  Benzonia 4 HOURS AS NEEDED [name], NP Taking Active   amLODipine (NORVASC) 5 MG tablet  [id number] No TAKE 1 TABLET BY MOUTH ONCE DAILY [name], NP Taking Active   atorvastatin (LIPITOR) 80 MG tablet [id number] No TAKE 1 TABLET BY MOUTH ONCE EVERY [name], NP Taking Active   Blood Glucose Monitoring Suppl (ACCU-CHEK GUIDE) w/Device KIT [id number] No Use to check blood sugar 3 times a day. [name], NP Taking Active   Blood Pressure Monitoring (OMRON 3 SERIES BP MONITOR) DEVI [id number] No Use to check blood pressure as directed [name], NP Taking Active   citalopram (CELEXA) 40 MG tablet [id number] No TAKE 1 TABLET BY MOUTH ONCE DAILY [name], NP Taking Active   EPINEPHrine 0.3 mg/0.3 mL IJ SOAJ injection [id number] No INJECT 1 SYRINGE INTO OUTER THIGH ONCE AS NEEDED FOR SEVERE ALLERGIC REACTION. [name], NP Taking Active   Fluticasone-Umeclidin-Vilant (TRELEGY ELLIPTA) 100-62.5-25 MCG/ACT AEPB [id number] No Inhale 1 puff into the lungs daily. [name], MD Taking Active   Fluticasone-Umeclidin-Vilant [facility] ELLIPTA) 100-62.5-25 MCG/ACT AEPB [id number] No Inhale 1 puff into the lungs daily. [name], MD Taking Active   furosemide (LASIX) 20 MG tablet [id number] No TAKE 1 TABLET BY MOUTH ONCE DAILY IN AFTERNOON AS NEEDED LEG EDEMA [name], NP Taking Active   gabapentin (NEURONTIN) 300 MG capsule [id number] No TAKE 1 CAPSULE BY MOUTH 3 TIMES DAILY ASNEEDED (NEED OFFICE VISIT FOR FURTHER REFILLS) [name], NP Taking Active   hydrALAZINE (APRESOLINE) 25 MG tablet [id number] No TAKE 1 TABLET BY MOUTH 3 TIMES DAILY [name], NP Taking Active   hydrOXYzine (VISTARIL) 25 MG capsule [id number]  Take 1 capsule (25 mg total) by mouth every 8 (eight) hours as needed for up to 20 days for itching. Mecum, Dani Gobble, PA-C  Active   levothyroxine (SYNTHROID) 50 MCG tablet [id number] No TAKE 1 TABLET BY MOUTH ONCE DAILY ON AN EMPTY STOMACH. WAIT 30 MINUTES BEFORE TAKING OTHER MEDS. [name], NP  Taking Active   lisinopril (ZESTRIL) 40 MG tablet [id number] No TAKE 1 TABLET BY MOUTH ONCE DAILY [name], NP Taking Active   meloxicam (MOBIC) 15 MG tablet [id number] No TAKE 1 TABLET BY MOUTH ONCE DAILY AS NEEDED WITH FOOD OR MILK [name], NP Taking Active   montelukast (SINGULAIR) 10 MG tablet [id number] No TAKE 1 TABLET BY MOUTH AT BEDTIME [name], NP Taking Active   nitroGLYCERIN (NITROSTAT) 0.4 MG SL tablet [id number] No DISSOLVE 1 TABLET UNDER TONGUE AT ONSET OF CHEST PAIN. REPEAT IN 5 MIN IF NOT RESOLVED, MAX 3 DOSES. 911 IF NEEDED. [name], NP Taking Active   omeprazole (PRILOSEC) 20 MG capsule [id number] No TAKE 1 CAPSULE BY MOUTH ONCE DAILY [name], NP Taking Active   predniSONE (DELTASONE) 10 MG tablet [id number]  Take 68m PO daily x4d, then 362mdaily x4d, then 2060maily x4d, then 69m42mily x4 d, then 5mg 85mly x4d Mecum, Erin E, PA-C  Active   spironolactone (ALDACTONE) 25 MG tablet [id number] 0.5 tablets (12.5 mg total) by mouth daily.  Patient not taking: Reported on 03/17/2022   HoldsJon BillingsNot Taking Active   TRULICITY 0.75 0.99.IP/3.8SN [id number] 0.75MG SUBQ ONCE A WEEK Baron SaneTaking Active   Vitamin D, Ergocalciferol, (DRISDOL) 1.25 MG (50000 UNIT) CAPS capsule [id number] 1 CAPSULE BY MOUTH EVERY 7 DAYS HoldsJon BillingsTaking Active  Patient Active Problem List   Diagnosis Date Noted   Foot pain, right 09/01/2021   Fibrocystic disease of both breasts 05/07/2019   IFG (impaired fasting glucose) 05/03/2019   Chronic venous insufficiency 11/09/2018   Overactive bladder 09/30/2018   Peripheral neuropathy 09/30/2018   Apnea 01/02/2016   Lymphedema 11/30/2014   Dyspnea on exertion 11/30/2014   Anxiety 11/29/2014   Arthritis 11/29/2014   COPD (chronic obstructive pulmonary disease) ([place]) 11/29/2014   Clinical depression 11/29/2014   H/O eating disorder  11/29/2014   Esophageal reflux 11/29/2014   Acne inversa 11/29/2014   Essential hypertension 11/29/2014   HLD (hyperlipidemia) 11/29/2014   Adult hypothyroidism 11/29/2014   Insomnia 11/29/2014   Low back pain 11/29/2014   Morbid obesity ([place]) 11/29/2014   Posttraumatic stress disorder 11/29/2014   Vitamin D deficiency 11/29/2014   Nicotine dependence, cigarettes, uncomplicated 45/80/9983   Conditions to be addressed/monitored per PCP order:  Chronic healthcare management needs, HTN, DM, COPD, anxiety/depression, LBP, GERD, lymphedema, HLD, sleep apnea  Care Plan : RNCM: COPD (Adult)  Updates made by [name], RN since 03/18/2022 12:00 AM     Problem: RNCM: Psychological Adjustment to Diagnosis (COPD)   Priority: Medium  Onset Date: 05/22/2020     Long-Range Goal: RNCM: Adjustment to Disease Achieved   Start Date: 05/22/2020  Expected End Date: 05/20/2022  Recent Progress: On track  Priority: High  Note:   Current Barriers:  Knowledge deficits related to basic understanding of COPD disease process Knowledge deficits related to basic COPD self care/management Knowledge deficit related to basic understanding of how to use inhalers and how inhaled medications work Knowledge deficit related to importance of energy conservation Limited Social Support Unable to independently manage COPD Lacks social connections Does not contact provider office for questions/concerns  03/18/22:  Patient has stopped smoking.  On Oxygen now 2-3L.  No CPAP.  Feeling better.  Case Manager Clinical Goal(s): patient will report using inhalers as prescribed including rinsing mouth after use patient will be able to verbalize understanding of COPD action plan and when to seek appropriate levels of medical care  patient will engage in lite exercise as tolerated to build/regain stamina and strength and reduce shortness of breath through activity tolerance  patient will verbalize basic understanding of  COPD disease process and self care activities  Interventions:  Collaboration with [name], NP regarding development and update of comprehensive plan of care as evidenced by provider attestation and co-signature Inter-disciplinary care team collaboration (see longitudinal plan of care) UNABLE to independently: manage COPD Provided patient with basic written and verbal COPD education on self care/management/and exacerbation prevention Provided patient with COPD action plan and reinforced importance of daily self assessment.  Provided written and verbal instructions on pursed lip breathing and utilized returned demonstration as teach back Provided instruction about proper use of medications used for management of COPD including inhalers Advised patient to self assesses COPD action plan zone and make appointment with provider if in the yellow zone for 48 hours without improvement. Provided patient with education about the role of exercise in the management of COPD Advised patient to engage in light exercise as tolerated 3-5 days a week Provided education about and advised patient to utilize infection prevention strategies to reduce risk of respiratory infection. Patient Goals/Self-Care Activities:  - decision-making supported - depression screen reviewed - emotional support provided - family involvement promoted - problem-solving facilitated - relaxation techniques promoted - verbalization of feelings encouraged Follow Up Plan: Telephone follow up  appointment with care management team member scheduled.   Problem: RNCM: Symptom Exacerbation (COPD)   Priority: High  Onset Date: 05/22/2020     Long-Range Goal: RNCM: Smoking Cessation Symptom Exacerbation Prevented or Minimized Completed 03/18/2022  Start Date: 05/22/2020  Expected End Date: 05/20/2022  Recent Progress: Not on track  Priority: High  Note:   Current Barriers:  Unable to independently stop smoking  Does not adhere to  provider recommendations re: smoking cessation  Lacks social connections Does not contact provider office for questions/concerns Tobacco abuse of >40 years; trying to stop-says she has stopped Previous quit attempts, unsuccessful several successful using patches, gum- has a pattern of stopping and then starting back  Reports smoking within 30 minutes of waking up Reports triggers to smoke include: stress, chronic conditions  Reports motivation to quit smoking includes: knows it will help her breathing and other chronic conditions  On a scale of 1-10, reports MOTIVATION to quit is 8 On a scale of 1-10, reports CONFIDENCE in quitting is 7 02/17/22:  Continues to smoke 2-3 cigarettes a day, trying to stop with activities 03/18/22:  Patient has stopped smoking Clinical Goal(s):  patient will work with RN Case Engineer, civil (consulting) and provider towards tobacco cessation  Interventions: Collaboration with [name], NP regarding development and update of comprehensive plan of care as evidenced by provider attestation and co-signature Inter-disciplinary care team collaboration (see longitudinal plan of care) Evaluation of current treatment plan reviewed.  Provided contact information for [place] Quit Line (1-800-QUIT-NOW). Patient will outreach this group for support. Update 01/22/21:  Patient states she has utilized [employer] for resources. Discussed plans with patient for ongoing care management follow up and provided patient with direct contact information for care management team Provided patient with printed smoking cessation educational materials Provided contact information for [place] Quit Line (1-800-QUIT-NOW). Patient will outreach this group for support. Praised patient for decreasing smoking- continued encouragement given. Patient Goals/Self-Care Activities patient will:  - mindfulness, talking to family and friends as a habit during cravings  - verbally commit to  reducing tobacco consumption - barriers to lifestyle changes reviewed and addressed - barriers to treatment reviewed and addressed - breathing techniques encouraged - modification of home and work environment promoted - rescue (action) plan reviewed - signs/symptoms of infection reviewed - signs/symptoms of worsening disease assessed - treatment plan reviewed Follow Up Plan: Telephone follow up appointment with care management team member scheduled.    Care Plan : RNCM: Hypertension (Adult)  Updates made by [name], RN since 03/18/2022 12:00 AM     Problem: RNCM: Hypertension (Hypertension)   Priority: Medium  Onset Date: 05/22/2020     Long-Range Goal: RNCM: Hypertension Monitored   Start Date: 05/22/2020  Expected End Date: 05/20/2022  Recent Progress: On track  Priority: Medium  Note:    Current Barriers:  Knowledge Deficits related to basic understanding of hypertension pathophysiology and self care management Knowledge Deficits related to understanding of medications prescribed for management of hypertension Limited Social Support Unable to independently manage HTN Lacks social connections Does not contact provider office for questions/concern 03/18/22:  Patient's BP stable, 155/60 today Case Manager Clinical Goal(s):   patient will verbalize understanding of plan for hypertension management patient will demonstrate improved adherence to prescribed treatment plan for hypertension as evidenced by taking all medications as prescribed, monitoring and recording blood pressure as directed, adhering to low sodium/DASH diet patient will demonstrate improved health management independence as evidenced by checking  blood pressure as directed and notifying PCP if SBP>160 or DBP > 90, taking all medications as prescribe, and adhering to a low sodium diet as discussed. Interventions:  Collaboration with [name], NP regarding development and update of comprehensive plan  of care as evidenced by provider attestation and co-signature Inter-disciplinary care team collaboration (see longitudinal plan of care) Evaluation of current treatment plan related to hypertension self management and patient's adherence to plan as established by provider.  Provided education to patient re: stroke prevention, s/s of heart attack and stroke, DASH diet, complications of uncontrolled blood pressure Reviewed medications with patient and discussed importance of compliance.  Discussed plans with patient for ongoing care management follow up and provided patient with direct contact information for care management team Advised patient, providing education and rationale, to monitor blood pressure daily and record, calling PCP for findings outside established parameters.  Patient Goals/Self-Care Activities Over the next 120 days, patient will:  - Self administers medications as prescribed Attends all scheduled provider appointments Calls provider office for new concerns, questions, or BP outside discussed parameters Checks BP and records as discussed Follows a low sodium diet/DASH diet - blood pressure trends reviewed - depression screen reviewed - home or ambulatory blood pressure monitoring encouraged Follow Up Plan: Telephone follow up appointment with care management team member scheduled.      Care Plan : RNCM: Depression (Adult)  Updates made by [name], RN since 03/18/2022 12:00 AM     Problem: RNCM: Symptoms (Depression)   Priority: High  Onset Date: 12/02/2020     Long-Range Goal: RNCM: Symptoms Monitored and Managed   Start Date: 12/02/2020  Expected End Date: 05/21/2023  Recent Progress: Not on track  Priority: High  Note:   Current Barriers:  Ineffective Self Health Maintenance in a patient with Anxiety and Depression Unable to independently manage depression and anxiety as evidence of multiple chronic conditions and the patient feeling her health is out of  control  Does not attend all scheduled provider appointments Lacks social connections Unable to perform IADLs independently Does not contact provider office for questions/concerns 03/18/22:  Continues to be followed by SW, declines [facility] right now.  No complaints/issues today. Clinical Goal(s):  Collaboration with [name], NP regarding development and update of comprehensive plan of care as evidenced by provider attestation and co-signature Inter-disciplinary care team collaboration (see longitudinal plan of care) patient will work with care management team to address care coordination and chronic disease management needs related to Disease Management Educational Needs Care Coordination Mental Health Counseling Level of Care Concerns   Interventions:  Evaluation of current treatment plan related to Anxiety and Depression, Financial constraints related to insurance not approving care for the patient to go to wound care , Limited social support, Level of care concerns, ADL IADL limitations, Mental Health Concerns , Family and relationship dysfunction, Substance abuse issues -  smoker , and Inability to perform IADL's independently self-management and patient's adherence to plan as established by provider. Collaboration with [name], NP regarding development and update of comprehensive plan of care as evidenced by provider attestation       and co-signature Inter-disciplinary care team collaboration (see longitudinal plan of care) Discussed plans with patient for ongoing care management follow up and provided patient with direct contact information for care management team Evaluation of depression and anxiety.  LCSW referral for increased stress-completed Collaborated with LCSW  Self Care Activities:  Patient verbalizes understanding of plan to effectively manage depression  and anxiety Self administers medications as prescribed Attends all scheduled provider  appointments Calls pharmacy for medication refills Attends church or other social activities Performs ADL's independently Performs IADL's independently Calls provider office for new concerns or questions Work with the [facility] team to meet needs for continued CCM services  Patient Goals: - activity or exercise based on tolerance encouraged - depression screen reviewed - emotional support provided - healthy lifestyle promoted - medication side effects monitored and managed - pain managed - participation in mental health treatment encouraged - quality of sleep assessed - response to mental health treatment monitored - response to pharmacologic therapy monitored - sleep hygiene techniques encouraged - social activities and relationships encouraged - substance use assessed Follow Up Plan: The care management team will reach out to the patient again over the next 30 business days.   Follow Up:  Patient agrees to Care Plan and Follow-up.  Plan: The Managed Medicaid care management team will reach out to the patient again over the next 30 business  days. and The  Patient has been provided with contact information for the Managed Medicaid care management team and has been advised to call with any health related questions or concerns.  Date/time of next scheduled RN care management/care coordination outreach: 04/23/22 at 315

## 2022-03-18 NOTE — Patient Instructions (Signed)
Visit Information  Ms. Jearl KlinefelterWilkerson was given information about Medicaid Managed Care team care coordination services as a part of their [facility]. Tavia Guadalupe DawnMarie Moler verbally consented to engagement with the [facility].   If you are experiencing a medical emergency, please call 911 or report to your local emergency department or urgent care.   If you have a non-emergency medical problem during routine business hours, please contact your provider's office and ask to speak with a nurse.   For questions related to your [facility] health plan, please call: [phone] or go here:[email or web address]  If you would like to schedule transportation through your [facility] plan, please call the following number at least 2 days in advance of your appointment: [phone].  You can also use the MTM portal or MTM mobile app to manage your rides. For the portal, please go to [email or web address].  Call the [facility] Crisis Line at [phone], at any time, 24 hours a day, 7 days a week. If you are in danger or need immediate medical attention call 911.  If you would like help to quit smoking, call 1-800-QUIT-NOW (78208975061-(825)744-7965) OR Espaol: 1-855-Djelo-Ya (4-132-440-1027(1-450-736-1216) o para ms informacin haga clic aqu or Text READY to 253-664200-400 to register via text  Ms. Piano - following are the goals we discussed in your visit today:   Goals Addressed             This Visit's Progress    RNCM: Keep Skin Clean and Dry       Follow Up Date: 04/23/22  - clean and dry skin well - keep skin dry - use a fragrance-free lotion on skin - wear a protective pad or garment    Why is this important?   Leaking urine (pee) can cause soreness from skin rashes and redness.  This is caused by skin being exposed to urine (pee).    03/18/22:  skin eruptions-seen by PCP yesterday-prescribed steroids, medications    RNCM: Track and Manage My  Symptoms-Depression       Timeframe:  Long-Range Goal Priority:  High Start Date:    12-02-2020                         Expected End Date:   ongoing                Follow Up Date: 04/23/22   - avoid negative self-talk - develop a personal safety plan - develop a plan to deal with triggers like holidays, anniversaries - exercise at least 2 to 3 times per week - have a plan for how to handle bad days - journal feelings and what helps to feel better or worse - spend time or talk with others at least 2 to 3 times per week - spend time or talk with others every day - watch for early signs of feeling worse    Why is this important?   Keeping track of your progress will help your treatment team find the right mix of medicine and therapy for you.  Write in your journal every day.  Day-to-day changes in depression symptoms are normal. It may be more helpful to check your progress at the end of each week instead of every day.    03/18/22:  SW following, declines [facility] at this time.    RNCM: Track and Manage My Triggers-COPD       Timeframe:  Long-Range Goal Priority:  Medium Start Date:  05-22-2020                        Expected End Date:   ongoing          Follow Up Date: 04/23/22   - avoid second hand smoke - eliminate smoking in my home - identify and avoid work-related triggers - identify and remove indoor air pollutants - limit outdoor activity during cold weather - listen for public air quality announcements every day    Why is this important?   Triggers are activities or things, like tobacco smoke or cold weather, that make your COPD (chronic obstructive pulmonary disease) flare-up.  Knowing these triggers helps you plan how to stay away from them.  When you cannot remove them, you can learn how to manage them.   03/18/22:  Patient has stopped smoking, on 2-3 L of Oxygen-feels better   Patient verbalizes understanding of instructions and care plan provided today  and agrees to view in MyChart. Active MyChart status and patient understanding of how to access instructions and care plan via MyChart confirmed with patient.     The Managed Medicaid care management team will reach out to the patient again over the next 30 business  days.  The  Patient has been provided with contact information for the Managed Medicaid care management team and has been advised to call with any health related questions or concerns.   Kathi Der RN, BSN [place]  [facility] Care Management Coordinator - Managed Medicaid High Risk [phone]   Following is a copy of your plan of care:  Care Plan : RNCM: COPD (Adult)  Updates made by [name], RN since 03/18/2022 12:00 AM     Problem: RNCM: Psychological Adjustment to Diagnosis (COPD)   Priority: Medium  Onset Date: 05/22/2020     Long-Range Goal: RNCM: Adjustment to Disease Achieved   Start Date: 05/22/2020  Expected End Date: 05/20/2022  Recent Progress: On track  Priority: High  Note:   Current Barriers:  Knowledge deficits related to basic understanding of COPD disease process Knowledge deficits related to basic COPD self care/management Knowledge deficit related to basic understanding of how to use inhalers and how inhaled medications work Knowledge deficit related to importance of energy conservation Limited Social Support Unable to independently manage COPD Lacks social connections Does not contact provider office for questions/concerns  03/18/22:  Patient has stopped smoking.  On Oxygen now 2-3L.  No CPAP.  Feeling better.  Case Manager Clinical Goal(s): patient will report using inhalers as prescribed including rinsing mouth after use patient will be able to verbalize understanding of COPD action plan and when to seek appropriate levels of medical care  patient will engage in lite exercise as tolerated to build/regain stamina and strength and reduce shortness of breath through  activity tolerance  patient will verbalize basic understanding of COPD disease process and self care activities  Interventions:  Collaboration with [name], NP regarding development and update of comprehensive plan of care as evidenced by provider attestation and co-signature Inter-disciplinary care team collaboration (see longitudinal plan of care) UNABLE to independently: manage COPD Provided patient with basic written and verbal COPD education on self care/management/and exacerbation prevention Provided patient with COPD action plan and reinforced importance of daily self assessment.  Provided written and verbal instructions on pursed lip breathing and utilized returned demonstration as teach back Provided instruction about proper use of medications used for management of COPD including inhalers  Advised patient to self assesses COPD action plan zone and make appointment with provider if in the yellow zone for 48 hours without improvement. Provided patient with education about the role of exercise in the management of COPD Advised patient to engage in light exercise as tolerated 3-5 days a week Provided education about and advised patient to utilize infection prevention strategies to reduce risk of respiratory infection. Patient Goals/Self-Care Activities:  - decision-making supported - depression screen reviewed - emotional support provided - family involvement promoted - problem-solving facilitated - relaxation techniques promoted - verbalization of feelings encouraged Follow Up Plan: Telephone follow up appointment with care management team member scheduled.   Problem: RNCM: Symptom Exacerbation (COPD)   Priority: High  Onset Date: 05/22/2020     Long-Range Goal: RNCM: Smoking Cessation Symptom Exacerbation Prevented or Minimized Completed 03/18/2022  Start Date: 05/22/2020  Expected End Date: 05/20/2022  Recent Progress: Not on track  Priority: High  Note:   Current  Barriers:  Unable to independently stop smoking  Does not adhere to provider recommendations re: smoking cessation  Lacks social connections Does not contact provider office for questions/concerns Tobacco abuse of >40 years; trying to stop-says she has stopped Previous quit attempts, unsuccessful several successful using patches, gum- has a pattern of stopping and then starting back  Reports smoking within 30 minutes of waking up Reports triggers to smoke include: stress, chronic conditions  Reports motivation to quit smoking includes: knows it will help her breathing and other chronic conditions  On a scale of 1-10, reports MOTIVATION to quit is 8 On a scale of 1-10, reports CONFIDENCE in quitting is 7 02/17/22:  Continues to smoke 2-3 cigarettes a day, trying to stop with activities 03/18/22:  Patient has stopped smoking Clinical Goal(s):  patient will work with RN Case Solicitor and provider towards tobacco cessation  Interventions: Collaboration with [name], NP regarding development and update of comprehensive plan of care as evidenced by provider attestation and co-signature Inter-disciplinary care team collaboration (see longitudinal plan of care) Evaluation of current treatment plan reviewed.  Provided contact information for [place] Quit Line (1-800-QUIT-NOW). Patient will outreach this group for support. Update 01/22/21:  Patient states she has utilized [employer] for resources. Discussed plans with patient for ongoing care management follow up and provided patient with direct contact information for care management team Provided patient with printed smoking cessation educational materials Provided contact information for [place] Quit Line (1-800-QUIT-NOW). Patient will outreach this group for support. Praised patient for decreasing smoking- continued encouragement given. Patient Goals/Self-Care Activities patient will:  - mindfulness, talking to  family and friends as a habit during cravings  - verbally commit to reducing tobacco consumption - barriers to lifestyle changes reviewed and addressed - barriers to treatment reviewed and addressed - breathing techniques encouraged - modification of home and work environment promoted - rescue (action) plan reviewed - signs/symptoms of infection reviewed - signs/symptoms of worsening disease assessed - treatment plan reviewed Follow Up Plan: Telephone follow up appointment with care management team member scheduled.    Care Plan : RNCM: Hypertension (Adult)  Updates made by [name], RN since 03/18/2022 12:00 AM     Problem: RNCM: Hypertension (Hypertension)   Priority: Medium  Onset Date: 05/22/2020     Long-Range Goal: RNCM: Hypertension Monitored   Start Date: 05/22/2020  Expected End Date: 05/20/2022  Recent Progress: On track  Priority: Medium  Note:    Current Barriers:  Knowledge Deficits  related to basic understanding of hypertension pathophysiology and self care management Knowledge Deficits related to understanding of medications prescribed for management of hypertension Limited Social Support Unable to independently manage HTN Lacks social connections Does not contact provider office for questions/concern 03/18/22:  Patient's BP stable, 155/60 today Case Manager Clinical Goal(s):   patient will verbalize understanding of plan for hypertension management patient will demonstrate improved adherence to prescribed treatment plan for hypertension as evidenced by taking all medications as prescribed, monitoring and recording blood pressure as directed, adhering to low sodium/DASH diet patient will demonstrate improved health management independence as evidenced by checking blood pressure as directed and notifying PCP if SBP>160 or DBP > 90, taking all medications as prescribe, and adhering to a low sodium diet as discussed. Interventions:  Collaboration with  [name], NP regarding development and update of comprehensive plan of care as evidenced by provider attestation and co-signature Inter-disciplinary care team collaboration (see longitudinal plan of care) Evaluation of current treatment plan related to hypertension self management and patient's adherence to plan as established by provider.  Provided education to patient re: stroke prevention, s/s of heart attack and stroke, DASH diet, complications of uncontrolled blood pressure Reviewed medications with patient and discussed importance of compliance.  Discussed plans with patient for ongoing care management follow up and provided patient with direct contact information for care management team Advised patient, providing education and rationale, to monitor blood pressure daily and record, calling PCP for findings outside established parameters.  Patient Goals/Self-Care Activities Over the next 120 days, patient will:  - Self administers medications as prescribed Attends all scheduled provider appointments Calls provider office for new concerns, questions, or BP outside discussed parameters Checks BP and records as discussed Follows a low sodium diet/DASH diet - blood pressure trends reviewed - depression screen reviewed - home or ambulatory blood pressure monitoring encouraged Follow Up Plan: Telephone follow up appointment with care management team member scheduled.     Care Plan : RNCM: Depression (Adult)  Updates made by [name], RN since 03/18/2022 12:00 AM     Problem: RNCM: Symptoms (Depression)   Priority: High  Onset Date: 12/02/2020     Long-Range Goal: RNCM: Symptoms Monitored and Managed   Start Date: 12/02/2020  Expected End Date: 05/21/2023  Recent Progress: Not on track  Priority: High  Note:   Current Barriers:  Ineffective Self Health Maintenance in a patient with Anxiety and Depression Unable to independently manage depression and anxiety as evidence of  multiple chronic conditions and the patient feeling her health is out of control  Does not attend all scheduled provider appointments Lacks social connections Unable to perform IADLs independently Does not contact provider office for questions/concerns 03/18/22:  Continues to be followed by SW, declines [facility] right now.  No complaints/issues today. Clinical Goal(s):  Collaboration with [name], NP regarding development and update of comprehensive plan of care as evidenced by provider attestation and co-signature Inter-disciplinary care team collaboration (see longitudinal plan of care) patient will work with care management team to address care coordination and chronic disease management needs related to Disease Management Educational Needs Care Coordination Mental Health Counseling Level of Care Concerns   Interventions:  Evaluation of current treatment plan related to Anxiety and Depression, Financial constraints related to insurance not approving care for the patient to go to wound care , Limited social support, Level of care concerns, ADL IADL limitations, Mental Health Concerns , Family and relationship dysfunction, Substance abuse issues -  smoker , and Inability to perform IADL's independently self-management and patient's adherence to plan as established by provider. Collaboration with [name], NP regarding development and update of comprehensive plan of care as evidenced by provider attestation       and co-signature Inter-disciplinary care team collaboration (see longitudinal plan of care) Discussed plans with patient for ongoing care management follow up and provided patient with direct contact information for care management team Evaluation of depression and anxiety.  LCSW referral for increased stress-completed Collaborated with LCSW  Self Care Activities:  Patient verbalizes understanding of plan to effectively manage depression and anxiety Self  administers medications as prescribed Attends all scheduled provider appointments Calls pharmacy for medication refills Attends church or other social activities Performs ADL's independently Performs IADL's independently Calls provider office for new concerns or questions Work with the [facility] team to meet needs for continued CCM services  Patient Goals: - activity or exercise based on tolerance encouraged - depression screen reviewed - emotional support provided - healthy lifestyle promoted - medication side effects monitored and managed - pain managed - participation in mental health treatment encouraged - quality of sleep assessed - response to mental health treatment monitored - response to pharmacologic therapy monitored - sleep hygiene techniques encouraged - social activities and relationships encouraged - substance use assessed Follow Up Plan: The care management team will reach out to the patient again over the next 30 business days.

## 2022-03-26 DIAGNOSIS — G4736 Sleep related hypoventilation in conditions classified elsewhere: Secondary | ICD-10-CM | POA: Diagnosis not present

## 2022-03-27 DIAGNOSIS — Z419 Encounter for procedure for purposes other than remedying health state, unspecified: Secondary | ICD-10-CM | POA: Diagnosis not present

## 2022-04-02 ENCOUNTER — Other Ambulatory Visit: Payer: Self-pay | Admitting: Nurse Practitioner

## 2022-04-03 ENCOUNTER — Other Ambulatory Visit: Payer: Medicaid Other
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
## 2022-04-03 NOTE — Telephone Encounter (Signed)
Requested Prescriptions  Pending Prescriptions Disp Refills   furosemide (LASIX) 20 MG tablet [Pharmacy Med Name: FUROSEMIDE 20 MG TAB] 90 tablet 0    Sig: TAKE 1 TABLET BY MOUTH ONCE DAILY IN AFTERNOON AS NEEDED LEG EDEMA     Cardiovascular:  Diuretics - Loop Failed - 04/02/2022  6:26 PM      Failed - Mg Level in normal range and within 180 days    No results found for: "MG"       Failed - Last BP in normal range    BP Readings from Last 1 Encounters:  03/17/22 (!) 151/71         Passed - K in normal range and within 180 days    Potassium  Date Value Ref Range Status  12/16/2021 4.2 3.5 - 5.2 mmol/L Final  08/05/2014 4.5 mmol/L Final    Comment:    3.5-5.1 NOTE: New Reference Range  07/03/14          Passed - Ca in normal range and within 180 days    Calcium  Date Value Ref Range Status  12/16/2021 8.7 8.7 - 10.2 mg/dL Final   Calcium, Total  Date Value Ref Range Status  08/05/2014 8.2 (L) mg/dL Final    Comment:    8.6-76.7 NOTE: New Reference Range  07/03/14          Passed - Na in normal range and within 180 days    Sodium  Date Value Ref Range Status  12/16/2021 142 134 - 144 mmol/L Final  08/05/2014 135 mmol/L Final    Comment:    135-145 NOTE: New Reference Range  07/03/14          Passed - Cr in normal range and within 180 days    Creatinine  Date Value Ref Range Status  08/05/2014 0.86 mg/dL Final    Comment:    2.09-4.70 NOTE: New Reference Range  07/03/14    Creatinine, Ser  Date Value Ref Range Status  12/16/2021 0.87 0.57 - 1.00 mg/dL Final         Passed - Cl in normal range and within 180 days    Chloride  Date Value Ref Range Status  12/16/2021 104 96 - 106 mmol/L Final  08/05/2014 102 mmol/L Final    Comment:    101-111 NOTE: New Reference Range  07/03/14          Passed - Valid encounter within last 6 months    Recent Outpatient Visits           2 weeks ago Rash and nonspecific skin eruption   [facility] Mecum, Oswaldo Conroy, PA-C   3 months ago Appointment canceled by hospital   [facility] [name], NP   3 months ago Essential hypertension   [facility] [name], NP   5 months ago Essential hypertension   [facility] [name], NP   6 months ago Essential hypertension   [facility] [name], NP       Future Appointments             In 4 weeks [name], NP [facility], [facility]   In 1 month Gollan, Tollie Pizza, MD [facility] [place]             lisinopril (ZESTRIL) 40 MG tablet [Pharmacy Med Name: LISINOPRIL 40 MG TAB] 90 tablet 0  Sig: TAKE 1 TABLET BY MOUTH ONCE DAILY     Cardiovascular:  ACE Inhibitors Failed - 04/02/2022  6:26 PM      Failed - Last BP in normal range    BP Readings from Last 1 Encounters:  03/17/22 (!) 151/71         Passed - Cr in normal range and within 180 days    Creatinine  Date Value Ref Range Status  08/05/2014 0.86 mg/dL Final    Comment:    2.99-2.42 NOTE: New Reference Range  07/03/14    Creatinine, Ser  Date Value Ref Range Status  12/16/2021 0.87 0.57 - 1.00 mg/dL Final         Passed - K in normal range and within 180 days    Potassium  Date Value Ref Range Status  12/16/2021 4.2 3.5 - 5.2 mmol/L Final  08/05/2014 4.5 mmol/L Final    Comment:    3.5-5.1 NOTE: New Reference Range  07/03/14          Passed - Patient is not pregnant      Passed - Valid encounter within last 6 months    Recent Outpatient Visits           2 weeks ago Rash and nonspecific skin eruption   [facility] Mecum, Oswaldo Conroy, PA-C   3 months ago Appointment canceled by hospital   [facility] [name], NP   3 months ago Essential hypertension   [facility] [name], NP   5 months ago Essential hypertension   [facility]  [name], NP   6 months ago Essential hypertension   [facility] [name], NP       Future Appointments             In 4 weeks [name], NP [facility], [facility]   In 1 month Gollan, Tollie Pizza, MD [facility] [place]. [facility]             gabapentin (NEURONTIN) 300 MG capsule [Pharmacy Med Name: GABAPENTIN 300 MG CAP] 270 capsule 0    Sig: TAKE 1 CAPSULE BY MOUTH 3 TIMES DAILY ASNEEDED (NEED OFFICE VISIT FOR FURTHER REFILLS)     Neurology: Anticonvulsants - gabapentin Passed - 04/02/2022  6:26 PM      Passed - Cr in normal range and within 360 days    Creatinine  Date Value Ref Range Status  08/05/2014 0.86 mg/dL Final    Comment:    6.83-4.19 NOTE: New Reference Range  07/03/14    Creatinine, Ser  Date Value Ref Range Status  12/16/2021 0.87 0.57 - 1.00 mg/dL Final         Passed - Completed PHQ-2 or PHQ-9 in the last 360 days      Passed - Valid encounter within last 12 months    Recent Outpatient Visits           2 weeks ago Rash and nonspecific skin eruption   [facility] Mecum, Oswaldo Conroy, PA-C   3 months ago Appointment canceled by hospital   [facility] [name], NP   3 months ago Essential hypertension   [facility] [name], NP   5 months ago Essential hypertension   [facility] [name], NP   6 months ago Essential hypertension   [facility] [name], NP       Future  Appointments             In 4 weeks [name], NP [facility], [facility]   In 1 month Gollan, Tollie Pizza, MD [facility] [place]. [facility]

## 2022-04-03 NOTE — Patient Instructions (Signed)
Visit Information  Cheryl Chandler was given information about Medicaid Managed Care team care coordination services as a part of their [facility] Medicaid benefit. Cheryl Chandler verbally consented to engagement with the [facility] Managed Care team.   If you are experiencing a medical emergency, please call 911 or report to your local emergency department or urgent care.   If you have a non-emergency medical problem during routine business hours, please contact your provider's office and ask to speak with a nurse.   For questions related to your [facility] health plan, please call: [phone] or go here:[email or web address]  If you would like to schedule transportation through your [name] plan, please call the following number at least 2 days in advance of your appointment: [phone].  You can also use the MTM portal or MTM mobile app to manage your rides. For the portal, please go to [email or web address]  Call the [facility] Crisis Line at [phone], at any time, 24 hours a day, 7 days a week. If you are in danger or need immediate medical attention call 911.  If you would like help to quit smoking, call 1-800-QUIT-NOW (443-143-0949) OR Espaol: 1-855-Djelo-Ya (0-940-768-0881) o para ms informacin haga clic aqu or Text READY to 103-159 to register via text  Cheryl Chandler - following are the goals we discussed in your visit today:   Goals Addressed   None       Social Worker will follow up on 05/04/22.   Cheryl Chandler, BSW, [facility]    High Risk Managed Medicaid Team  [phone]   Following is a copy of your plan of care:  There are no care plans that you recently modified to display for this patient.

## 2022-04-08 ENCOUNTER — Ambulatory Visit: Payer: Medicaid Other | Admitting: Cardiovascular Disease

## 2022-04-09 DIAGNOSIS — R3981 Functional urinary incontinence: Secondary | ICD-10-CM | POA: Diagnosis not present

## 2022-04-09 DIAGNOSIS — Z993 Dependence on wheelchair: Secondary | ICD-10-CM | POA: Diagnosis not present

## 2022-04-16 IMAGING — DX DG FOOT COMPLETE 3+V*R*
3 series · 3 of 3 positions shown · non-contrast
Comparison: None.

CLINICAL DATA: Right foot ulcer.

EXAM:
RIGHT FOOT COMPLETE - 3+ VIEW

[foot ap]
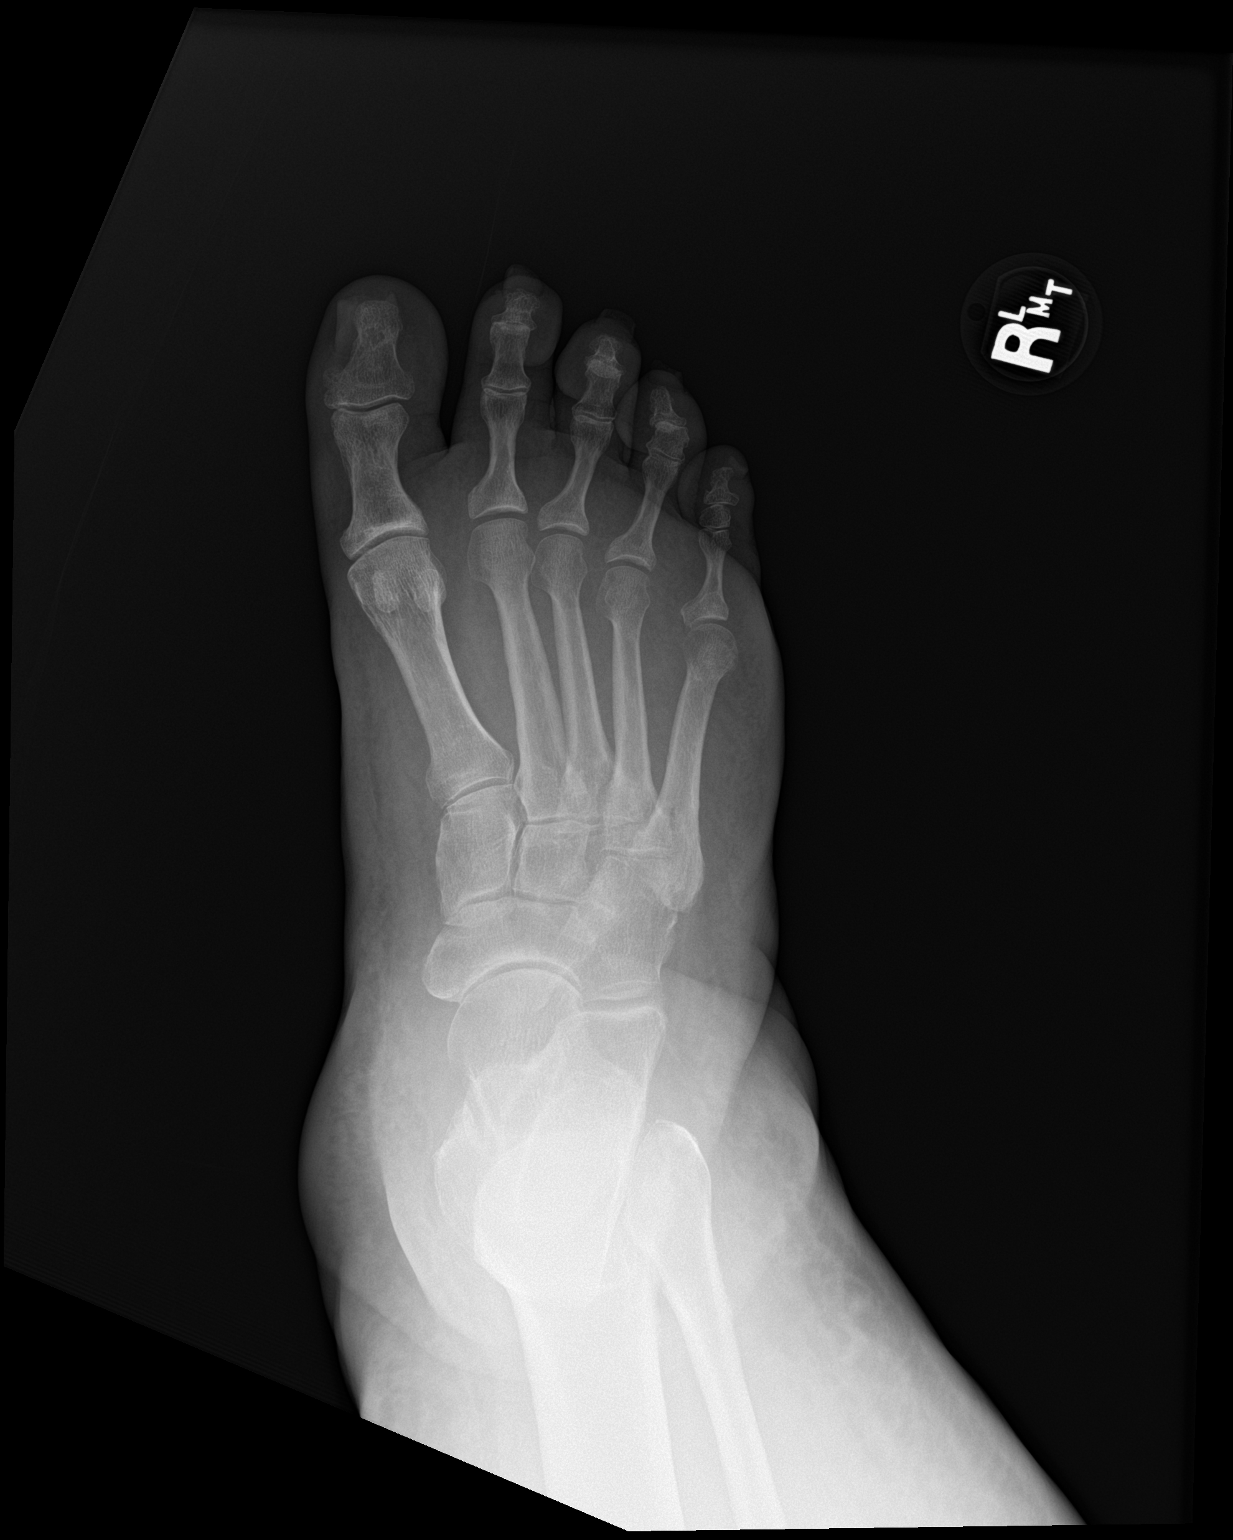

[foot obl]
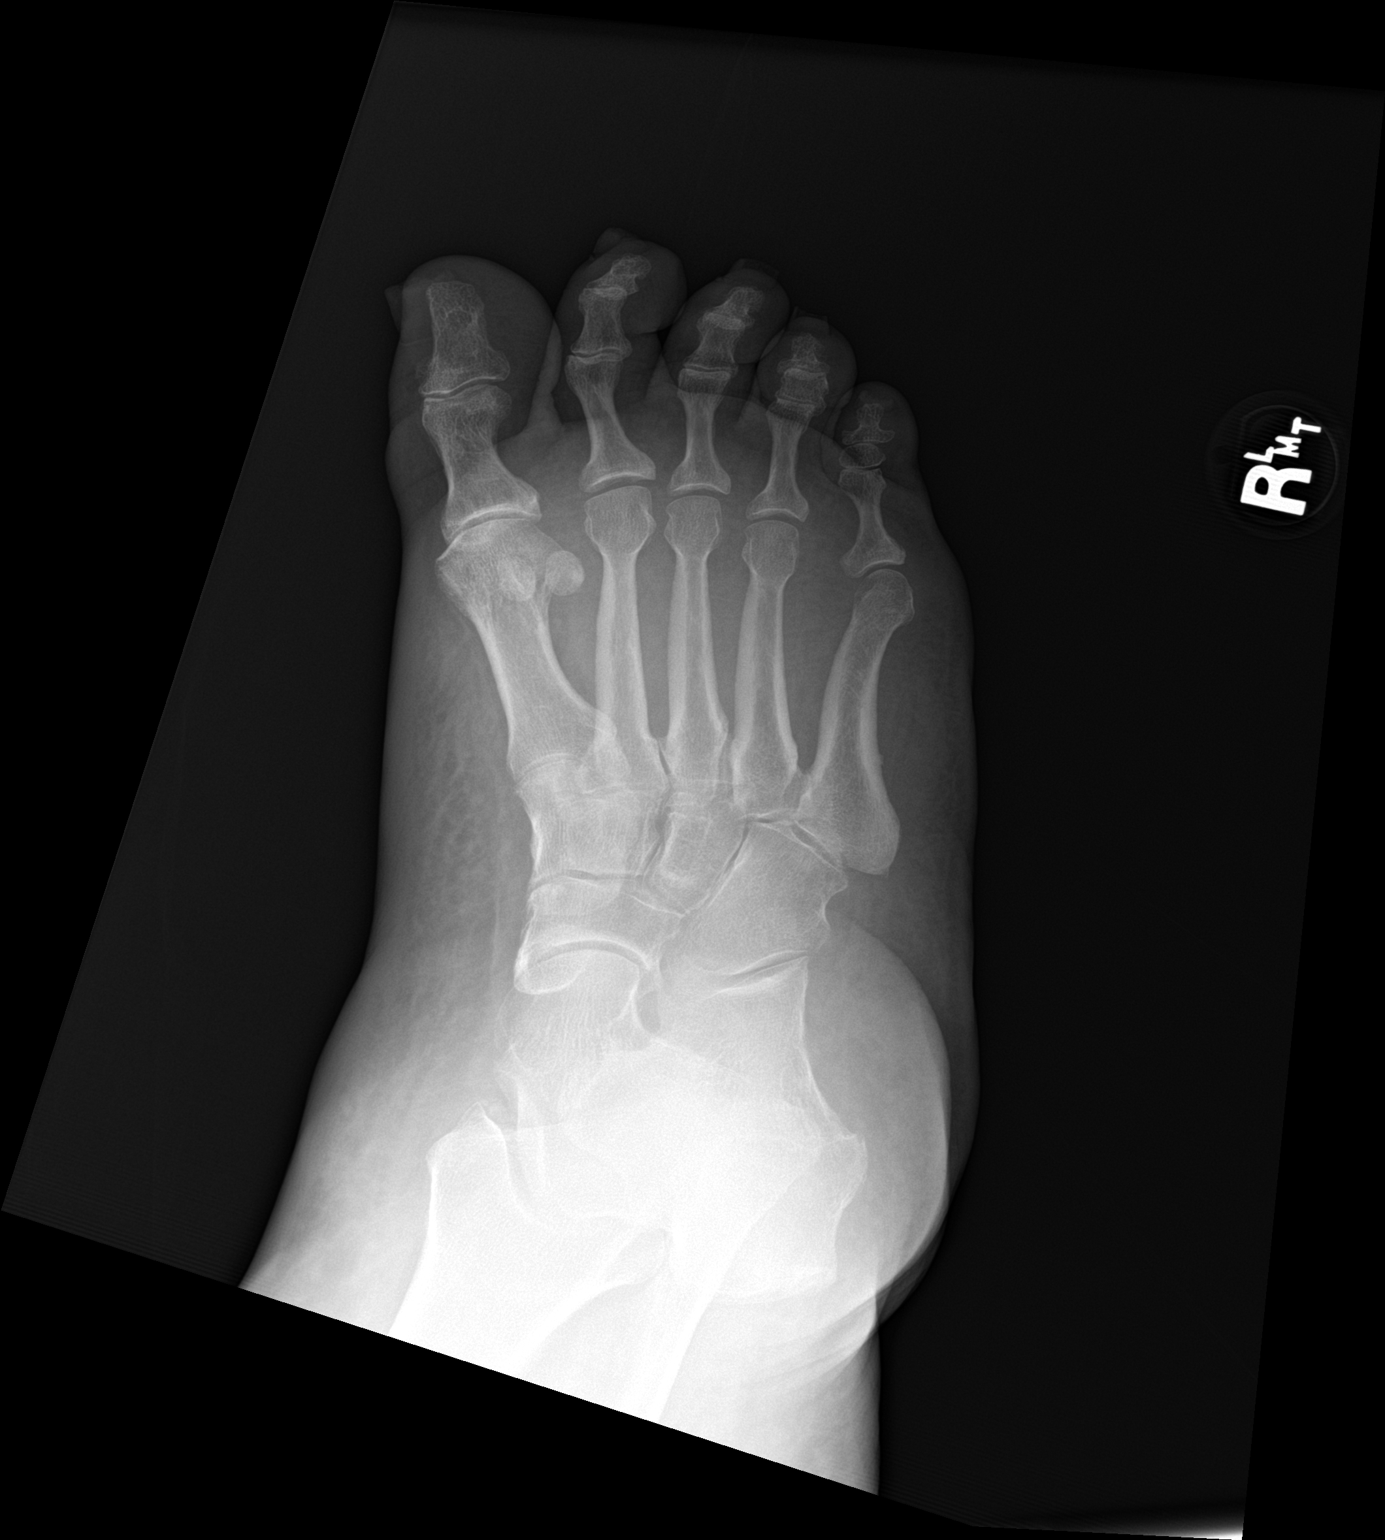

[foot lat]
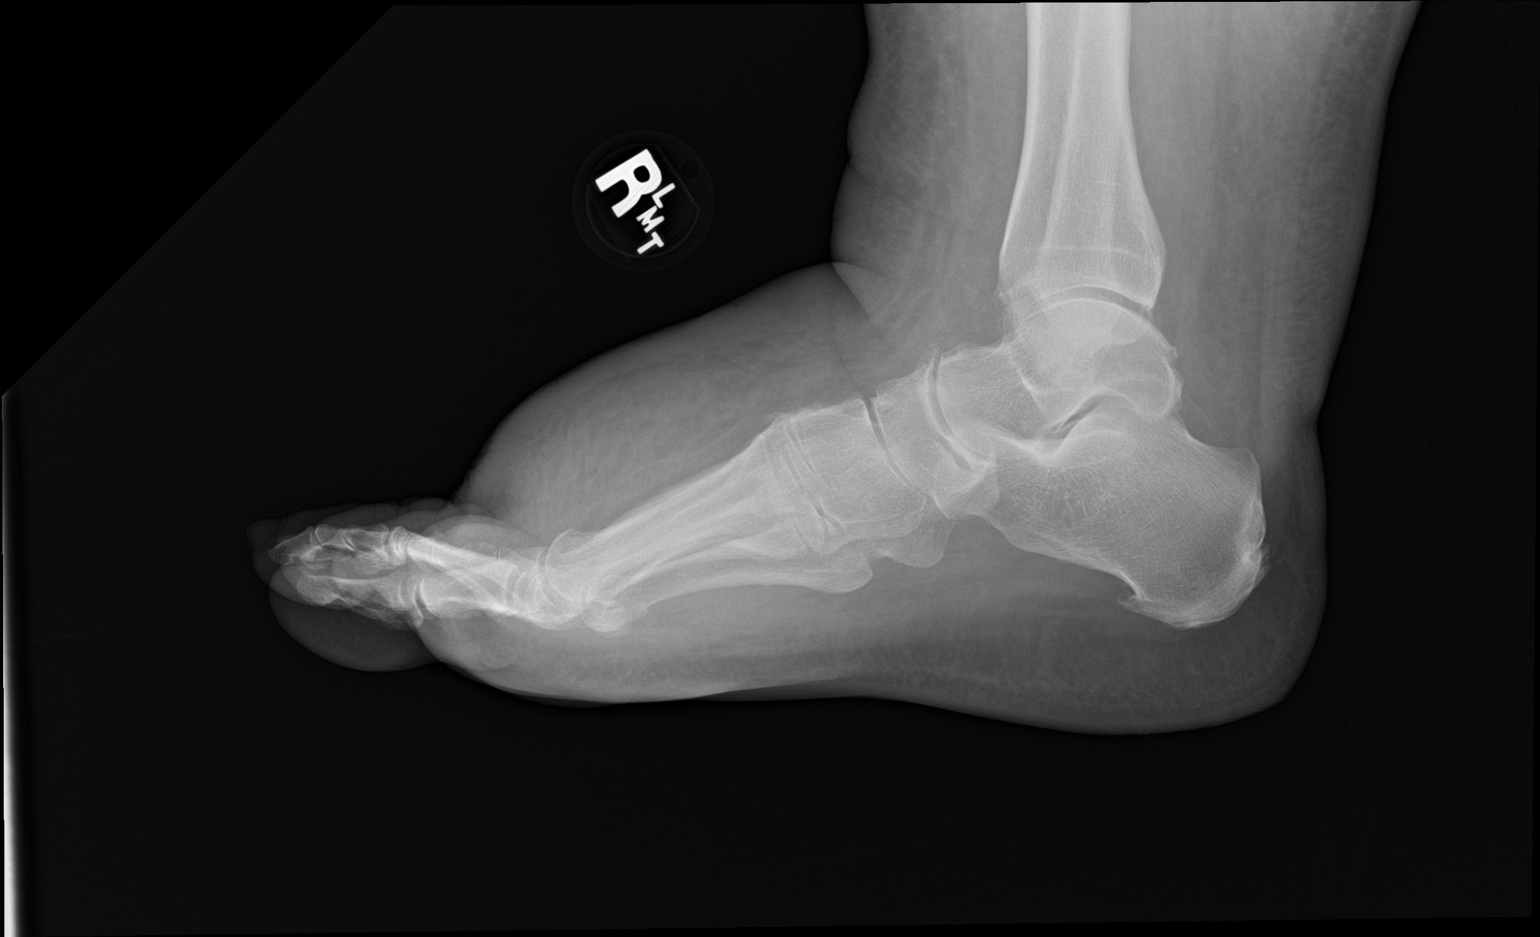

[3 of 3 positions shown; findings below may reference images not displayed]

FINDINGS: There is no evidence of fracture or dislocation. There is no
evidence of arthropathy or other focal bone abnormality. Dorsal soft
tissue swelling is noted concerning for infection. No definite lytic
destruction is seen.
IMPRESSION: Dorsal soft tissue swelling concerning for infection. No definite
bony abnormality seen.

## 2022-04-23 ENCOUNTER — Encounter: Payer: Self-pay | Admitting: Obstetrics and Gynecology

## 2022-04-23 ENCOUNTER — Other Ambulatory Visit: Payer: Medicaid Other | Admitting: Obstetrics and Gynecology

## 2022-04-23 NOTE — Patient Instructions (Signed)
Visit Information  Ms. Bouyer was given information about Medicaid Managed Care team care coordination services as a part of their [facility] Medicaid benefit. Azizi Zayli Schuring verbally consented to engagement with the [facility] Managed Care team.   If you are experiencing a medical emergency, please call 911 or report to your local emergency department or urgent care.   If you have a non-emergency medical problem during routine business hours, please contact your provider's office and ask to speak with a nurse.   For questions related to your [facility] health plan, please call: [phone] or go here:[email or web address]  If you would like to schedule transportation through your [employer] plan, please call the following number at least 2 days in advance of your appointment: [phone].  You can also use the MTM portal or MTM mobile app to manage your rides. For the portal, please go to [email or web address].  Call the [place] at [phone], at any time, 24 hours a day, 7 days a week. If you are in danger or need immediate medical attention call 911.  If you would like help to quit smoking, call 1-800-QUIT-NOW 647-858-5844) OR Espaol: 1-855-Djelo-Ya HD:1601594) o para ms informacin haga clic aqu or Text READY to 200-400 to register via text  Ms. Siefring - following are the goals we discussed in your visit today:   Goals Addressed             This Visit's Progress    RNCM: Keep Skin Clean and Dry       Follow Up Date: 05/21/22  - clean and dry skin well - keep skin dry - use a fragrance-free lotion on skin - wear a protective pad or garment    Why is this important?   Leaking urine (pee) can cause soreness from skin rashes and redness.  This is caused by skin being exposed to urine (pee).    04/23/22: skin rash face, arms, legs, back, an chest-finished steroids-on Abx.  To see PCP next week.  No other skin complaints  today     RNCM: Track and Manage My Symptoms-Depression       Timeframe:  Long-Range Goal Priority:  High Start Date:    12-02-2020                         Expected End Date:   ongoing                Follow Up Date: 05/21/22   - avoid negative self-talk - develop a personal safety plan - develop a plan to deal with triggers like holidays, anniversaries - exercise at least 2 to 3 times per week - have a plan for how to handle bad days - journal feelings and what helps to feel better or worse - spend time or talk with others at least 2 to 3 times per week - spend time or talk with others every day - watch for early signs of feeling worse    Why is this important?   Keeping track of your progress will help your treatment team find the right mix of medicine and therapy for you.  Write in your journal every day.  Day-to-day changes in depression symptoms are normal. It may be more helpful to check your progress at the end of each week instead of every day.    04/23/22  SW following, declines [facility] at this time.     RNCM: Track and Manage My  Triggers-COPD       Timeframe:  Long-Range Goal Priority:  Medium Start Date:     05-22-2020                        Expected End Date:   ongoing          Follow Up Date: 05/21/22   - avoid second hand smoke - eliminate smoking in my home - identify and avoid work-related triggers - identify and remove indoor air pollutants - limit outdoor activity during cold weather - listen for public air quality announcements every day    Why is this important?   Triggers are activities or things, like tobacco smoke or cold weather, that make your COPD (chronic obstructive pulmonary disease) flare-up.  Knowing these triggers helps you plan how to stay away from them.  When you cannot remove them, you can learn how to manage them.   04/23/22:  patient has upcoming appointments with PCP and [facility], to have ECG 05/06/22.  No smoking.  On 2-4 L of Oxygen    Patient verbalizes understanding of instructions and care plan provided today and agrees to view in MyChart. Active MyChart status and patient understanding of how to access instructions and care plan via MyChart confirmed with patient.     The Managed Medicaid care management team will reach out to the patient again over the next 30 business  days.  The  Patient  has been provided with contact information for the Managed Medicaid care management team and has been advised to call with any health related questions or concerns.   Kathi Der RN, BSN [place]  [facility] Care Management Coordinator - Managed Medicaid High Risk [phone]   Following is a copy of your plan of care:  Care Plan : RNCM: COPD (Adult)  Updates made by [name], RN since 04/23/2022 12:00 AM     Problem: RNCM: Psychological Adjustment to Diagnosis (COPD)   Priority: Medium  Onset Date: 05/22/2020     Long-Range Goal: RNCM: Adjustment to Disease Achieved   Start Date: 05/22/2020  Expected End Date: 07/23/2022  Recent Progress: On track  Priority: High  Note:   Current Barriers:  Knowledge deficits related to basic understanding of COPD disease process Knowledge deficits related to basic COPD self care/management Knowledge deficit related to basic understanding of how to use inhalers and how inhaled medications work Knowledge deficit related to importance of energy conservation Limited Social Support Unable to independently manage COPD Lacks social connections Does not contact provider office for questions/concerns  04/23/22:  No smoking, Oxygen 2-4L at night and sometimes during the day.  Case Manager Clinical Goal(s): patient will report using inhalers as prescribed including rinsing mouth after use patient will be able to verbalize understanding of COPD action plan and when to seek appropriate levels of medical care  patient will engage in lite exercise as tolerated to  build/regain stamina and strength and reduce shortness of breath through activity tolerance  patient will verbalize basic understanding of COPD disease process and self care activities  Interventions:  Collaboration with [name], NP regarding development and update of comprehensive plan of care as evidenced by provider attestation and co-signature Inter-disciplinary care team collaboration (see longitudinal plan of care) UNABLE to independently: manage COPD Provided patient with basic written and verbal COPD education on self care/management/and exacerbation prevention Provided patient with COPD action plan and reinforced importance of daily self assessment.  Provided written  and verbal instructions on pursed lip breathing and utilized returned demonstration as teach back Provided instruction about proper use of medications used for management of COPD including inhalers Advised patient to self assesses COPD action plan zone and make appointment with provider if in the yellow zone for 48 hours without improvement. Provided patient with education about the role of exercise in the management of COPD Advised patient to engage in light exercise as tolerated 3-5 days a week Provided education about and advised patient to utilize infection prevention strategies to reduce risk of respiratory infection. Patient Goals/Self-Care Activities:  - decision-making supported - depression screen reviewed - emotional support provided - family involvement promoted - problem-solving facilitated - relaxation techniques promoted - verbalization of feelings encouraged Follow Up Plan: Telephone follow up appointment with care management team member scheduled.   Care Plan : RNCM: Hypertension (Adult)  Updates made by [name], RN since 04/23/2022 12:00 AM     Problem: RNCM: Hypertension (Hypertension)   Priority: Medium  Onset Date: 05/22/2020     Long-Range Goal: RNCM: Hypertension Monitored    Start Date: 05/22/2020  Expected End Date: 07/23/2022  Recent Progress: On track  Priority: Medium  Note:    Current Barriers:  Knowledge Deficits related to basic understanding of hypertension pathophysiology and self care management Knowledge Deficits related to understanding of medications prescribed for management of hypertension Limited Social Support Unable to independently manage HTN Lacks social connections Does not contact provider office for questions/concern 04/23/22:  BP 125-155/55-65.  Has appt with PCP and [facility] in January, ECG 05/06/22 Case Manager Clinical Goal(s):   patient will verbalize understanding of plan for hypertension management patient will demonstrate improved adherence to prescribed treatment plan for hypertension as evidenced by taking all medications as prescribed, monitoring and recording blood pressure as directed, adhering to low sodium/DASH diet patient will demonstrate improved health management independence as evidenced by checking blood pressure as directed and notifying PCP if SBP>160 or DBP > 90, taking all medications as prescribe, and adhering to a low sodium diet as discussed. Interventions:  Collaboration with [name], NP regarding development and update of comprehensive plan of care as evidenced by provider attestation and co-signature Inter-disciplinary care team collaboration (see longitudinal plan of care) Evaluation of current treatment plan related to hypertension self management and patient's adherence to plan as established by provider.  Provided education to patient re: stroke prevention, s/s of heart attack and stroke, DASH diet, complications of uncontrolled blood pressure Reviewed medications with patient and discussed importance of compliance.  Discussed plans with patient for ongoing care management follow up and provided patient with direct contact information for care management team Advised patient, providing education and  rationale, to monitor blood pressure daily and record, calling PCP for findings outside established parameters.  Patient Goals/Self-Care Activities Over the next 120 days, patient will:  - Self administers medications as prescribed Attends all scheduled provider appointments Calls provider office for new concerns, questions, or BP outside discussed parameters Checks BP and records as discussed Follows a low sodium diet/DASH diet - blood pressure trends reviewed - depression screen reviewed - home or ambulatory blood pressure monitoring encouraged Follow Up Plan: Telephone follow up appointment with care management team member scheduled.     Care Plan : RNCM: Depression (Adult)  Updates made by [name], RN since 04/23/2022 12:00 AM     Problem: RNCM: Symptoms (Depression)   Priority: High  Onset Date: 12/02/2020     Long-Range Goal: RNCM:  Symptoms Monitored and Managed   Start Date: 12/02/2020  Expected End Date: 05/21/2023  Recent Progress: Not on track  Priority: High  Note:   Current Barriers:  Ineffective Self Health Maintenance in a patient with Anxiety and Depression Unable to independently manage depression and anxiety as evidence of multiple chronic conditions and the patient feeling her health is out of control  Does not attend all scheduled provider appointments Lacks social connections Unable to perform IADLs independently Does not contact provider office for questions/concerns 04/23/22:  Continues to be followed by SW, declines [facility] right now.  No complaints/issues today. Clinical Goal(s):  Collaboration with [name], NP regarding development and update of comprehensive plan of care as evidenced by provider attestation and co-signature Inter-disciplinary care team collaboration (see longitudinal plan of care) patient will work with care management team to address care coordination and chronic disease management needs related to Disease  Management Educational Needs Care Coordination Mental Health Counseling Level of Care Concerns   Interventions:  Evaluation of current treatment plan related to Anxiety and Depression, Financial constraints related to insurance not approving care for the patient to go to wound care , Limited social support, Level of care concerns, ADL IADL limitations, Mental Health Concerns , Family and relationship dysfunction, Substance abuse issues -  smoker , and Inability to perform IADL's independently self-management and patient's adherence to plan as established by provider. Collaboration with [name], NP regarding development and update of comprehensive plan of care as evidenced by provider attestation       and co-signature Inter-disciplinary care team collaboration (see longitudinal plan of care) Discussed plans with patient for ongoing care management follow up and provided patient with direct contact information for care management team Evaluation of depression and anxiety.  LCSW referral for increased stress-completed Collaborated with LCSW  Self Care Activities:  Patient verbalizes understanding of plan to effectively manage depression and anxiety Self administers medications as prescribed Attends all scheduled provider appointments Calls pharmacy for medication refills Attends church or other social activities Performs ADL's independently Performs IADL's independently Calls provider office for new concerns or questions Work with the [facility] team to meet needs for continued CCM services  Patient Goals: - activity or exercise based on tolerance encouraged - depression screen reviewed - emotional support provided - healthy lifestyle promoted - medication side effects monitored and managed - pain managed - participation in mental health treatment encouraged - quality of sleep assessed - response to mental health treatment monitored - response to pharmacologic therapy monitored -  sleep hygiene techniques encouraged - social activities and relationships encouraged - substance use assessed Follow Up Plan: The care management team will reach out to the patient again over the next 30 business days.

## 2022-04-23 NOTE — Patient Outreach (Signed)
Medicaid Managed Care   Nurse Care Manager Note  04/23/2022 Name:  Cheryl Chandler MRN:  [id number] DOB:  [birth year]  Cheryl Chandler is an 56 y.o. year old female who is a primary patient of Cheryl Billings, NP.  The [facility] Managed Care Coordination team was consulted for assistance with:    Chronic healthcare management needs, sleep apnea, HTN, DM, COPD, anxiety/depression, LBP, GERD, HLD, lymphedema  [name] was given information about Medicaid Managed Care Coordination team services today. [name] agreed to services and verbal consent obtained.  Engaged with patient by telephone for follow up visit in response to provider referral for case management and/or care coordination services.   Assessments/Interventions:  Review of past medical history, allergies, medications, health status, including review of consultants reports, laboratory and other test data, was performed as part of comprehensive evaluation and provision of chronic care management services.  SDOH (Social Determinants of Health) assessments and interventions performed: [name]    Flowsheet Row Patient Outreach Telephone from 04/23/2022 in [place] Patient Outreach Telephone from 03/18/2022 in [place] Office Visit from 03/17/2022 in [place] Patient Outreach from 03/16/2022 in [place] Patient Outreach Telephone from 02/19/2022 in [place] Coordination Patient Outreach Telephone from 02/17/2022 in [place] Coordination  [name]        Food Insecurity Interventions Other (Comment)  [has food stamps and resources] -- -- -- -- --  Housing Interventions -- Intervention Not Indicated -- -- -- --  Transportation Interventions Patient Resources Tax adviser) -- -- -- -- --  Utilities  Interventions -- -- -- -- -- Intervention Not Indicated  Alcohol Usage Interventions -- -- -- -- Intervention Not Indicated (Score <7) --  Depression Interventions/Treatment  -- -- Medication, Currently on Treatment -- -- --  Financial Strain Interventions -- -- -- Other (Comment)  [working with BSW] -- --  Physical Activity Interventions -- -- -- -- -- Other (Comments)  [not physically able to engage in moderate to strenuous exercise]  Social Connections Interventions -- Intervention Not Indicated -- -- -- --     Care Plan  Allergies  Allergen Reactions   Codeine Shortness Of Breath and Rash   Percocet [Oxycodone-Acetaminophen] Shortness Of Breath   Sulfa Antibiotics Shortness Of Breath and Rash   Aspirin Hives   Bee Venom     Bee stings   Darvon [Propoxyphene]     Darvocet-N 100   Latex    Oxycodone Nausea And Vomiting   Penicillins     Has patient had a PCN reaction causing immediate rash, facial/tongue/throat swelling, SOB or lightheadedness with hypotension: Unknown Has patient had a PCN reaction causing severe rash involving mucus membranes or skin necrosis: Unknown Has patient had a PCN reaction that required hospitalization: Unknown Has patient had a PCN reaction occurring within the last 10 years: Unknown If all of the above answers are "NO", then may proceed with Cephalosporin use.   Medications Reviewed Today     Reviewed by [name], RN (Registered Nurse) on 04/23/22 at 1539  Med List Status: <None>   Medication Order Taking? Sig Documenting Provider Last Dose Status Informant  ACCU-CHEK GUIDE test strip [id number] No USE TO CHECK BLOOD SUGAR 3 TIMES DAILY AS DIRECTED Cheryl Billings, NP Taking Active   Accu-Chek Softclix Lancets lancets [id number] No CHECK BLOOD SUGAR TWICE DAILY Cheryl Billings, NP Taking Active   albuterol (PROVENTIL) (  2.5 MG/3ML) 0.083% nebulizer solution [id number] No USE 1 VIAL VIA NEBULIZER EVERY 4 HOURS AS NEEDED Cheryl Billings, NP  Taking Active   amLODipine (NORVASC) 5 MG tablet [id number] No TAKE 1 TABLET BY MOUTH ONCE DAILY Cheryl Billings, NP Taking Active   atorvastatin (LIPITOR) 80 MG tablet [id number] No TAKE 1 TABLET BY MOUTH ONCE EVERY Cheryl Bradley, NP Taking Active   Blood Glucose Monitoring Suppl (ACCU-CHEK GUIDE) w/Device KIT [id number] No Use to check blood sugar 3 times a day. Cheryl Chandler T, NP Taking Active   Blood Pressure Monitoring (OMRON 3 SERIES BP MONITOR) DEVI [id number] No Use to check blood pressure as directed Cheryl Billings, NP Taking Active   citalopram (CELEXA) 40 MG tablet [id number] No TAKE 1 TABLET BY MOUTH ONCE DAILY Cheryl Billings, NP Taking Active   EPINEPHrine 0.3 mg/0.3 mL IJ SOAJ injection [id number] No INJECT 1 SYRINGE INTO OUTER THIGH ONCE AS NEEDED FOR SEVERE ALLERGIC REACTION. Cheryl Billings, NP Taking Active   Fluticasone-Umeclidin-Vilant (TRELEGY ELLIPTA) 100-62.5-25 MCG/ACT AEPB [id number] No Inhale 1 puff into the lungs daily. Cheryl Pita, MD Taking Active   Fluticasone-Umeclidin-Vilant [facility] ELLIPTA) 100-62.5-25 MCG/ACT AEPB [id number] No Inhale 1 puff into the lungs daily. Cheryl Pita, MD Taking Active   furosemide (LASIX) 20 MG tablet [id number]  TAKE 1 TABLET BY MOUTH ONCE DAILY IN AFTERNOON AS NEEDED LEG EDEMA Cheryl Billings, NP  Active   gabapentin (NEURONTIN) 300 MG capsule [id number]  TAKE 1 CAPSULE BY MOUTH 3 TIMES DAILY ASNEEDED (NEED OFFICE VISIT FOR FURTHER REFILLS) Cheryl Billings, NP  Active   hydrALAZINE (APRESOLINE) 25 MG tablet [id number] No TAKE 1 TABLET BY MOUTH 3 TIMES DAILY Cheryl Billings, NP Taking Active   levothyroxine (SYNTHROID) 50 MCG tablet [id number] No TAKE 1 TABLET BY MOUTH ONCE DAILY ON AN EMPTY STOMACH. WAIT 30 MINUTES BEFORE TAKING OTHER MEDS. Cheryl Billings, NP Taking Active   lisinopril (ZESTRIL) 40 MG tablet [id number]  TAKE 1 TABLET BY MOUTH ONCE DAILY Cheryl Billings, NP  Active   meloxicam (MOBIC) 15  MG tablet [id number] No TAKE 1 TABLET BY MOUTH ONCE DAILY AS NEEDED WITH FOOD OR MILK Cheryl Billings, NP Taking Active   montelukast (SINGULAIR) 10 MG tablet [id number] No TAKE 1 TABLET BY MOUTH AT BEDTIME Cheryl Billings, NP Taking Active   nitroGLYCERIN (NITROSTAT) 0.4 MG SL tablet [id number] No DISSOLVE 1 TABLET UNDER TONGUE AT ONSET OF CHEST PAIN. REPEAT IN 5 MIN IF NOT RESOLVED, MAX 3 DOSES. 911 IF NEEDED. Cheryl Billings, NP Taking Active   omeprazole (PRILOSEC) 20 MG capsule [id number] No TAKE 1 CAPSULE BY MOUTH ONCE DAILY Cheryl Billings, NP Taking Active   predniSONE (DELTASONE) 10 MG tablet [id number]  Take 54m PO daily x4d, then 322mdaily x4d, then 2081maily x4d, then 54m40mily x4 d, then 5mg 42mly x4d Mecum, Erin E, PA-C  Active   spironolactone (ALDACTONE) 25 MG tablet [id number] 0.5 tablets (12.5 mg total) by mouth daily.  Patient not taking: Reported on 03/17/2022   HoldsJon BillingsNot Taking Active   TRULICITY 0.75 7.54.GB/2.0FE [id number] 0.75MG SUBQ ONCE A WEEK Baron SaneTaking Active   Vitamin D, Ergocalciferol, (DRISDOL) 1.25 MG (50000 UNIT) CAPS capsule [id number] 1 CAPSULE BY MOUTH EVERY 7 DAYS HoldsJon BillingsTaking Active            Patient Active Problem List   Diagnosis Date Noted   Foot pain, right 09/01/2021   Fibrocystic disease of  both breasts 05/07/2019   IFG (impaired fasting glucose) 05/03/2019   Chronic venous insufficiency 11/09/2018   Overactive bladder 09/30/2018   Peripheral neuropathy 09/30/2018   Apnea 01/02/2016   Lymphedema 11/30/2014   Dyspnea on exertion 11/30/2014   Anxiety 11/29/2014   Arthritis 11/29/2014   COPD (chronic obstructive pulmonary disease) ([place]) 11/29/2014   Clinical depression 11/29/2014   H/O eating disorder 11/29/2014   Esophageal reflux 11/29/2014   Acne inversa 11/29/2014   Essential hypertension 11/29/2014   HLD (hyperlipidemia) 11/29/2014   Adult hypothyroidism  11/29/2014   Insomnia 11/29/2014   Low back pain 11/29/2014   Morbid obesity ([place]) 11/29/2014   Posttraumatic stress disorder 11/29/2014   Vitamin D deficiency 11/29/2014   Nicotine dependence, cigarettes, uncomplicated 01/75/1025   Conditions to be addressed/monitored per PCP order:  Chronic healthcare management needs, sleep apnea, HTN, DM, COPD, anxiety/depression, LBP, GERD, HLD, lymphedema  Care Plan : RNCM: COPD (Adult)  Updates made by [name], RN since 04/23/2022 12:00 AM     Problem: RNCM: Psychological Adjustment to Diagnosis (COPD)   Priority: Medium  Onset Date: 05/22/2020     Long-Range Goal: RNCM: Adjustment to Disease Achieved   Start Date: 05/22/2020  Expected End Date: 07/23/2022  Recent Progress: On track  Priority: High  Note:   Current Barriers:  Knowledge deficits related to basic understanding of COPD disease process Knowledge deficits related to basic COPD self care/management Knowledge deficit related to basic understanding of how to use inhalers and how inhaled medications work Knowledge deficit related to importance of energy conservation Limited Social Support Unable to independently manage COPD Lacks social connections Does not contact provider office for questions/concerns  04/23/22:  No smoking, Oxygen 2-4L at night and sometimes during the day.  Case Manager Clinical Goal(s): patient will report using inhalers as prescribed including rinsing mouth after use patient will be able to verbalize understanding of COPD action plan and when to seek appropriate levels of medical care  patient will engage in lite exercise as tolerated to build/regain stamina and strength and reduce shortness of breath through activity tolerance  patient will verbalize basic understanding of COPD disease process and self care activities  Interventions:  Collaboration with Cheryl Billings, NP regarding development and update of comprehensive plan of care as evidenced  by provider attestation and co-signature Inter-disciplinary care team collaboration (see longitudinal plan of care) UNABLE to independently: manage COPD Provided patient with basic written and verbal COPD education on self care/management/and exacerbation prevention Provided patient with COPD action plan and reinforced importance of daily self assessment.  Provided written and verbal instructions on pursed lip breathing and utilized returned demonstration as teach back Provided instruction about proper use of medications used for management of COPD including inhalers Advised patient to self assesses COPD action plan zone and make appointment with provider if in the yellow zone for 48 hours without improvement. Provided patient with education about the role of exercise in the management of COPD Advised patient to engage in light exercise as tolerated 3-5 days a week Provided education about and advised patient to utilize infection prevention strategies to reduce risk of respiratory infection. Patient Goals/Self-Care Activities:  - decision-making supported - depression screen reviewed - emotional support provided - family involvement promoted - problem-solving facilitated - relaxation techniques promoted - verbalization of feelings encouraged Follow Up Plan: Telephone follow up appointment with care management team member scheduled.   Care Plan : RNCM: Hypertension (Adult)  Updates made by [name], RN since  04/23/2022 12:00 AM     Problem: RNCM: Hypertension (Hypertension)   Priority: Medium  Onset Date: 05/22/2020     Long-Range Goal: RNCM: Hypertension Monitored   Start Date: 05/22/2020  Expected End Date: 07/23/2022  Recent Progress: On track  Priority: Medium  Note:    Current Barriers:  Knowledge Deficits related to basic understanding of hypertension pathophysiology and self care management Knowledge Deficits related to understanding of medications prescribed for  management of hypertension Limited Social Support Unable to independently manage HTN Lacks social connections Does not contact provider office for questions/concern 04/23/22:  BP 125-155/55-65.  Has appt with PCP and [facility] in January, ECG 05/06/22 Case Manager Clinical Goal(s):   patient will verbalize understanding of plan for hypertension management patient will demonstrate improved adherence to prescribed treatment plan for hypertension as evidenced by taking all medications as prescribed, monitoring and recording blood pressure as directed, adhering to low sodium/DASH diet patient will demonstrate improved health management independence as evidenced by checking blood pressure as directed and notifying PCP if SBP>160 or DBP > 90, taking all medications as prescribe, and adhering to a low sodium diet as discussed. Interventions:  Collaboration with Cheryl Billings, NP regarding development and update of comprehensive plan of care as evidenced by provider attestation and co-signature Inter-disciplinary care team collaboration (see longitudinal plan of care) Evaluation of current treatment plan related to hypertension self management and patient's adherence to plan as established by provider.  Provided education to patient re: stroke prevention, s/s of heart attack and stroke, DASH diet, complications of uncontrolled blood pressure Reviewed medications with patient and discussed importance of compliance.  Discussed plans with patient for ongoing care management follow up and provided patient with direct contact information for care management team Advised patient, providing education and rationale, to monitor blood pressure daily and record, calling PCP for findings outside established parameters.  Patient Goals/Self-Care Activities Over the next 120 days, patient will:  - Self administers medications as prescribed Attends all scheduled provider appointments Calls provider office for new  concerns, questions, or BP outside discussed parameters Checks BP and records as discussed Follows a low sodium diet/DASH diet - blood pressure trends reviewed - depression screen reviewed - home or ambulatory blood pressure monitoring encouraged Follow Up Plan: Telephone follow up appointment with care management team member scheduled.     Care Plan : RNCM: Depression (Adult)  Updates made by [name], RN since 04/23/2022 12:00 AM     Problem: RNCM: Symptoms (Depression)   Priority: High  Onset Date: 12/02/2020     Long-Range Goal: RNCM: Symptoms Monitored and Managed   Start Date: 12/02/2020  Expected End Date: 05/21/2023  Recent Progress: Not on track  Priority: High  Note:   Current Barriers:  Ineffective Self Health Maintenance in a patient with Anxiety and Depression Unable to independently manage depression and anxiety as evidence of multiple chronic conditions and the patient feeling her health is out of control  Does not attend all scheduled provider appointments Lacks social connections Unable to perform IADLs independently Does not contact provider office for questions/concerns 04/23/22:  Continues to be followed by SW, declines [facility] right now.  No complaints/issues today. Clinical Goal(s):  Collaboration with Cheryl Billings, NP regarding development and update of comprehensive plan of care as evidenced by provider attestation and co-signature Inter-disciplinary care team collaboration (see longitudinal plan of care) patient will work with care management team to address care coordination and chronic disease management needs related to Disease Management  Educational Needs Care Coordination Mental Health Counseling Level of Care Concerns   Interventions:  Evaluation of current treatment plan related to Anxiety and Depression, Financial constraints related to insurance not approving care for the patient to go to wound care , Limited social support,  Level of care concerns, ADL IADL limitations, Mental Health Concerns , Family and relationship dysfunction, Substance abuse issues -  smoker , and Inability to perform IADL's independently self-management and patient's adherence to plan as established by provider. Collaboration with Cheryl Billings, NP regarding development and update of comprehensive plan of care as evidenced by provider attestation       and co-signature Inter-disciplinary care team collaboration (see longitudinal plan of care) Discussed plans with patient for ongoing care management follow up and provided patient with direct contact information for care management team Evaluation of depression and anxiety.  LCSW referral for increased stress-completed Collaborated with LCSW  Self Care Activities:  Patient verbalizes understanding of plan to effectively manage depression and anxiety Self administers medications as prescribed Attends all scheduled provider appointments Calls pharmacy for medication refills Attends church or other social activities Performs ADL's independently Performs IADL's independently Calls provider office for new concerns or questions Work with the [facility] team to meet needs for continued CCM services  Patient Goals: - activity or exercise based on tolerance encouraged - depression screen reviewed - emotional support provided - healthy lifestyle promoted - medication side effects monitored and managed - pain managed - participation in mental health treatment encouraged - quality of sleep assessed - response to mental health treatment monitored - response to pharmacologic therapy monitored - sleep hygiene techniques encouraged - social activities and relationships encouraged - substance use assessed Follow Up Plan: The care management team will reach out to the patient again over the next 30 business days.   Follow Up:  Patient agrees to Care Plan and Follow-up.  Plan: The Managed Medicaid  care management team will reach out to the patient again over the next 30 business  days. and The  Patient has been provided with contact information for the Managed Medicaid care management team and has been advised to call with any health related questions or concerns.  Date/time of next scheduled RN care management/care coordination outreach:  05/21/22 at 1230.

## 2022-04-24 ENCOUNTER — Telehealth: Payer: Self-pay

## 2022-04-24 ENCOUNTER — Other Ambulatory Visit: Payer: Medicaid Other | Admitting: Licensed Clinical Social Worker

## 2022-04-24 DIAGNOSIS — J449 Chronic obstructive pulmonary disease, unspecified: Secondary | ICD-10-CM

## 2022-04-24 NOTE — Patient Outreach (Signed)
Medicaid Managed Care Social Work Note  04/24/2022 Name:  Cheryl Chandler MRN:  [id number] DOB:  1965/12/28  Cheryl Chandler is an 56 y.o. year old female who is a primary patient of [name], NP.  The Medicaid Managed Care Coordination team was consulted for assistance with:  [place] and Resources  Cheryl Chandler was given information about Medicaid Managed Care Coordination team services today. [place] Patient agreed to services and verbal consent obtained.  Engaged with patient  for by telephone forfollow up visit in response to referral for case management and/or care coordination services.   Assessments/Interventions:  Review of past medical history, allergies, medications, health status, including review of consultants reports, laboratory and other test data, was performed as part of comprehensive evaluation and provision of chronic care management services.  SDOH: (Social Determinant of Health) assessments and interventions performed: [name]    Flowsheet Row Patient Outreach Telephone from 04/24/2022 in [place] Patient Outreach Telephone from 04/23/2022 in [place] Patient Outreach Telephone from 03/18/2022 in [place] Office Visit from 03/17/2022 in [place] Patient Outreach from 03/16/2022 in [place] Patient Outreach Telephone from 02/19/2022 in [place] Coordination  [name]        Food Insecurity Interventions -- Other (Comment)  [has food stamps and resources] -- -- -- --  Housing Interventions -- -- Intervention Not Indicated -- -- --  Transportation Interventions -- Patient Resources Tax adviser) -- -- -- --  Alcohol Usage Interventions -- -- -- -- -- Intervention Not Indicated (Score <7)  Depression Interventions/Treatment  -- --  -- Medication, Currently on Treatment -- --  Financial Strain Interventions -- -- -- -- Other (Comment)  [working with BSW] --  Stress Interventions Offered [employer], Provide Counseling -- -- -- -- --  Social Connections Interventions -- -- Intervention Not Indicated -- -- --       Advanced Directives Status:  See Care Plan for related entries.  Care Plan                 Allergies  Allergen Reactions   Codeine Shortness Of Breath and Rash   Percocet [Oxycodone-Acetaminophen] Shortness Of Breath   Sulfa Antibiotics Shortness Of Breath and Rash   Aspirin Hives   Bee Venom     Bee stings   Darvon [Propoxyphene]     Darvocet-N 100   Latex    Oxycodone Nausea And Vomiting   Penicillins     Has patient had a PCN reaction causing immediate rash, facial/tongue/throat swelling, SOB or lightheadedness with hypotension: Unknown Has patient had a PCN reaction causing severe rash involving mucus membranes or skin necrosis: Unknown Has patient had a PCN reaction that required hospitalization: Unknown Has patient had a PCN reaction occurring within the last 10 years: Unknown If all of the above answers are "NO", then may proceed with Cephalosporin use.    Medications Reviewed Today     Reviewed by [name], RN (Registered Nurse) on 04/23/22 at 1539  Med List Status: <None>   Medication Order Taking? Sig Documenting Provider Last Dose Status Informant  ACCU-CHEK GUIDE test strip [id number] No USE TO CHECK BLOOD SUGAR 3 TIMES DAILY AS DIRECTED [name], NP Taking Active   Accu-Chek Softclix Lancets lancets [id number] No CHECK BLOOD SUGAR TWICE DAILY [name], NP Taking Active   albuterol (PROVENTIL) (2.5 MG/3ML) 0.083% nebulizer solution  [id number] No USE 1 VIAL VIA NEBULIZER EVERY 4 HOURS AS NEEDED [name], NP Taking Active   amLODipine (NORVASC) 5 MG tablet [id number] No TAKE 1 TABLET BY MOUTH ONCE DAILY [name], NP Taking Active    atorvastatin (LIPITOR) 80 MG tablet [id number] No TAKE 1 TABLET BY MOUTH ONCE EVERY Cheryl Bradley, NP Taking Active   Blood Glucose Monitoring Suppl (ACCU-CHEK GUIDE) w/Device KIT [id number] No Use to check blood sugar 3 times a day. Cheryl Chandler T, NP Taking Active   Blood Pressure Monitoring (OMRON 3 SERIES BP MONITOR) DEVI [id number] No Use to check blood pressure as directed [name], NP Taking Active   citalopram (CELEXA) 40 MG tablet [id number] No TAKE 1 TABLET BY MOUTH ONCE DAILY [name], NP Taking Active   EPINEPHrine 0.3 mg/0.3 mL IJ SOAJ injection [id number] No INJECT 1 SYRINGE INTO OUTER THIGH ONCE AS NEEDED FOR SEVERE ALLERGIC REACTION. [name], NP Taking Active   Fluticasone-Umeclidin-Vilant (TRELEGY ELLIPTA) 100-62.5-25 MCG/ACT AEPB [id number] No Inhale 1 puff into the lungs daily. Cheryl Pita, MD Taking Active   Fluticasone-Umeclidin-Vilant [facility] ELLIPTA) 100-62.5-25 MCG/ACT AEPB [id number] No Inhale 1 puff into the lungs daily. Cheryl Pita, MD Taking Active   furosemide (LASIX) 20 MG tablet [id number]  TAKE 1 TABLET BY MOUTH ONCE DAILY IN AFTERNOON AS NEEDED LEG EDEMA [name], NP  Active   gabapentin (NEURONTIN) 300 MG capsule [id number]  TAKE 1 CAPSULE BY MOUTH 3 TIMES DAILY ASNEEDED (NEED OFFICE VISIT FOR FURTHER REFILLS) [name], NP  Active   hydrALAZINE (APRESOLINE) 25 MG tablet [id number] No TAKE 1 TABLET BY MOUTH 3 TIMES DAILY [name], NP Taking Active   levothyroxine (SYNTHROID) 50 MCG tablet [id number] No TAKE 1 TABLET BY MOUTH ONCE DAILY ON AN EMPTY STOMACH. WAIT 30 MINUTES BEFORE TAKING OTHER MEDS. [name], NP Taking Active   lisinopril (ZESTRIL) 40 MG tablet [id number]  TAKE 1 TABLET BY MOUTH ONCE DAILY [name], NP  Active   meloxicam (MOBIC) 15 MG tablet [id number] No TAKE 1 TABLET BY MOUTH ONCE DAILY AS NEEDED WITH FOOD OR MILK [name], NP Taking Active   montelukast  (SINGULAIR) 10 MG tablet [id number] No TAKE 1 TABLET BY MOUTH AT BEDTIME [name], NP Taking Active   nitroGLYCERIN (NITROSTAT) 0.4 MG SL tablet [id number] No DISSOLVE 1 TABLET UNDER TONGUE AT ONSET OF CHEST PAIN. REPEAT IN 5 MIN IF NOT RESOLVED, MAX 3 DOSES. 911 IF NEEDED. [name], NP Taking Active   omeprazole (PRILOSEC) 20 MG capsule [id number] No TAKE 1 CAPSULE BY MOUTH ONCE DAILY [name], NP Taking Active   predniSONE (DELTASONE) 10 MG tablet [id number]  Take 34m PO daily x4d, then 313mdaily x4d, then 2039maily x4d, then 31m58mily x4 d, then 5mg 9mly x4d Mecum, Erin E, PA-C  Active   spironolactone (ALDACTONE) 25 MG tablet [id number] 0.5 tablets (12.5 mg total) by mouth daily.  Patient not taking: Reported on 03/17/2022   HoldsJon BillingsNot Taking Active   TRULICITY 0.75 6.83.FG/9.0SX [id number] 0.75MG SUBQ ONCE A WEEK Baron SaneTaking Active   Vitamin D, Ergocalciferol, (DRISDOL) 1.25 MG (50000 UNIT) CAPS capsule [id number] 1 CAPSULE BY MOUTH EVERY 7 DAYS HoldsJon BillingsTaking Active             Patient Active Problem List   Diagnosis Date Noted   Foot pain, right 09/01/2021   Fibrocystic disease of both breasts 05/07/2019  IFG (impaired fasting glucose) 05/03/2019   Chronic venous insufficiency 11/09/2018   Overactive bladder 09/30/2018   Peripheral neuropathy 09/30/2018   Apnea 01/02/2016   Lymphedema 11/30/2014   Dyspnea on exertion 11/30/2014   Anxiety 11/29/2014   Arthritis 11/29/2014   COPD (chronic obstructive pulmonary disease) ([place]) 11/29/2014   Clinical depression 11/29/2014   H/O eating disorder 11/29/2014   Esophageal reflux 11/29/2014   Acne inversa 11/29/2014   Essential hypertension 11/29/2014   HLD (hyperlipidemia) 11/29/2014   Adult hypothyroidism 11/29/2014   Insomnia 11/29/2014   Low back pain 11/29/2014   Morbid obesity ([place]) 11/29/2014   Posttraumatic stress disorder  11/29/2014   Vitamin D deficiency 11/29/2014   Nicotine dependence, cigarettes, uncomplicated 12/45/8099    Conditions to be addressed/monitored per PCP order:  Depression  Care Plan : General Social Work (Adult)  Updates made by [name], LCSW since 04/24/2022 12:00 AM     Problem: Coping Skills (General Plan of Care)      Long-Range Goal: Coping Skills Enhanced   Start Date: 11/07/2020  Expected End Date: 03/07/2022  Recent Progress: On track  Priority: High  Note:   Timeframe:  Long-Range Goal Priority:  High Start Date:       08/26/21                    Expected End Date:  ongoing   Current Barriers:  Financial constraints Mental Health Concerns  Social Isolation Limited education about mental health support resources that are available to her within the local area* Cognitive Deficits Lacks knowledge of community resource   Clinical Social Work Clinical Goal(s):  Over the next 90 days, client will work with SW to address concerns related to experiencing ongoing symptoms of depression, sadness and grief since the passing of her mother Over the next 90 days, patient will work with LCSW to address needs related to implementing appropriate self-care and depression management.    Interventions: Patient interviewed and appropriate assessments performed Provided mental health counseling with regards to patient's loss and ongoing stressors. Patient was very close with her mother who passed away a few years ago and she continues to experience symptoms of grief from this loss. Patient denies wanting LCSW to make a referral for grief counseling through [place] again but was receptive to coping skill and resource education/information that was provided during session today. Patient reports decorating for the holidays helps her to connect with her mother as they did this together when she was alive.  Provided patient with information about managing depression within her daily  life. Patient has ongoing grief symptoms as well. Patient states "I feel worn down." Patient reports that she talks out loud to her mother's picture and this is very helpful to her. Grief support resource education provided again during session today.  Provided reflective listening and implemented appropriate interventions to help support patient and her emotional needs  Discussed plans with patient for ongoing care management follow up and provided patient with direct contact information for [facility] team Advised patient to contact [facility] team with any urgent case management needs. Assisted patient/caregiver with obtaining information about health plan benefits LCSW completed past referral for family to have a wheelchair ramp built at their residence due to ongoing mobility concerns. Ramp has been successfully installed.  PCS actively involved with patient and providing ongoing services.  Positive reinforcement provided to patient for quitting smoking cigarettes. Coping skill review education provided for future triggers.  Brief self-care education provided  to both patient and spouse. Patient confirms that she continues to go to food pantry for food insecurity. Resource information has been provided.  Patient reports ongoing pain and issues with mobility. Patient shares that her pain resides in her pains and legs. Emotional support and coping skill education provided. Patient confirms that her PCS aide continues to provide her with services. However, aide has to be reminded to put on her mask since patient is high risk. LCSW reviewed resources that [name] provided her with in the past.  Patient reports that she had a wonderful birthday spent with her family this past May. Patient reports receiving appropriate socialization. Patient reports that her spouse took her out to eat and gifted her with several things which was a nice surprise.  Patient confirms stable transportation arrangements for upcoming PCP  appointment this week.  Patient repots that she is losing a lot of weight but is not sure how much she has lost as she does not have a scale. Patient reports that a goal of hers is to lose weight but she has not been intentionally losing weight at this time.  Patient reports that her aide [name] continues to assist her with PCS and is an excellent support within the home for her and spouse.  Patient planted a tomato plant and successfully grew over 5 tomatoes over the summer. Patient is very proud of this achievement as this was her first time planting a tomato plant and is now interested in picking up gardening/planting as a hobby. Patient reports that her aunt continues to be a positive support for her in her life and someone that she can rely on in the case of emergencies.  Patient was educated on healthy self-care skills and advised her to use these into her daily routine.  04/10/21- Update- Patient is agreeable to social work closure at this time as she still does not need medication management or counseling as she is managing her mental health well at this time. Patient talks to her younger sister everyday to gain emotional support. Patient says that her sister is into body building and helps to educate and remind her to do her own self-care.  Patient reports that she has been elevating her legs daily to elevate pain and swelling.  [facility] LCSW completed care coordination with Thomasene Lot on 01/15/21 and [facility] LCSW provided the following suggested resource-It is likely their best option given their loss of housing and lack of consistent income:   Publishing rights manager                                                                                                                       ([employer] [place]) [phone] Ext. 104 M-F, 8am - 4pm Patient was successfully approved for food stamps but is still experiencing financial concerns. She reports that she and her husband have  no clothes that fit them and they are having to start using a string for their pants and patient only  has one bra at this time. [facility] LCSW will send referral to [facility] BSW today for financial support.  BSW spoke with patient about resources needed. Patient states she does receive SSI and was approved for foodstamps. Patient is familiar with all of the food pantries and other resources in [place]. BSW will assist patient with finding an eye doctor. Patient states she has not been in 10 years. BSW will research and put a list of eye doctors together that accept Medicaid in [facility]. 04/30/20: BSW contacted and spoke with patient, she stated she was not doing good because she found out that she has a new landlord and they are going up on her rent. BSW informed patient to make sure she informs her social workers at [employer] that they have an increase for foodstamps and Medicaid. Patient stated that she sleeps on the couch and is in need of a power recliner/lift chair. BSW will send patients PCP a message asking for a script to be sent. 05/21/21: BSW completed follow up telephone call with patient. She states she is unsure if their new landlord has received their January rent. Patient states she is also worried due to the new landlord making them pay rent online and on the 1st of the month. Patient states she is unable to get anyone on the phone. Patient states her rent will go up to 400 from 255 in March. Patient states she does not have many clothes but goes to [employer] to get some Materials engineer. No other resources needed at this time.   06/20/21: BSW completed follow up telelphone call with patient. Patient states she is doing well. Patient states [employer] [place] will cover all of Vaccines. Patient states no resources are needed at this time. 08/26/21: BSW completed telephone outreach with patient, she stated since going to the hospital she has been feeling weak. Patient states other than that she is feeling  okay. Patient stated she is going to get a new phone on Wednesday. Patient states her birthday is on Friday and her husband may take her to a [place]. No other resources are needed at this time.Update- Patient reports experiencing additional grief lately. [facility] LCSW offered referral for berievement counseling but she declined. UPDATE 08/26/21- Patient reports that she has a near death experience last month. Patient shares that she had a fall. 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 [facility] LCSW completed education on available mental health resources within her area to utilize. Patient admits to experiencing some sadness due to grief from her mother's passing. Emotional support provided. 10/13/21 UPDATE- Patient reports that she is working hard on living a healthier life both physically and mentally. Patient reports that she is making active changes in her diet. She reports that she eventually wishes to quit smoking again and has started picking up new hobbies to implement during times of craving such as coloring, crocheting and being creative. Patient reports that she is unsure if she needs mental health support at this time because her main concerns are centered around her and her spouse's physical health instead their mental health. [facility] LCSW educated patient on how they affect one another and their importance. Patient denies any urgent concerns. She reports no issues with affording food due to food stamp assistance. [facility] LCSW will reassess social work needs 30 days from now. UPDATE 11/17/21- Patient reports that she is experiencing several stressors at this time. She was encouraged to consider mental health treatment but she is not interested at  this time. Patient was encouraged to consider mental health resources. Patient reports that their car is in the shop and that they have no stable transportation besides their sister and her husband's father. Patient reports that her spouse has had several recent  health issues that have affected her mental state. Patient was educated on healthy coping skills for stress. Patient denies no urgent concerns or issues at this time. UPDATE- Care Coordination completed with [facility] LCSW, PCP office, Pharmacist, [facility] BSW and PCP. Patient has canceled several medical appointments because they are unable to afford a copay. Patient will benefit from financial assistance and may benefit from a [facility] application. Patient has an upcoming appointment with [facility] BSW next week. Patient is agreeable to exploring financial assistance options with [facility] BSW that will help alleviate other areas of financial stress within their life in order for patient to be able to afford medical expenses. [facility] LCSW encouraged patient to consider individual counseling. Patient is agreeable to referral for counseling but needs to find out her sister's email first as she is unable to go to office to fill out intake paperwork. Patient prefers virtual counseling to save money on transportation. [facility] LCSW will successfully place referral to [facility] during next outreach. [facility] LCSW completed care coordination with [facility] BSW.  BSW 12/15/21: BSW completed a telephone outreach with patient. She stated everything is still going okay. She has been having problems with her leg wraps and has an appointment today. She states her husband is still having issues with his eye and with the doctor and she is going to make a compliant. Patient states no resources are needed at this time. [facility] LCSW 01/19/22: Patient reports that she was unable to sleep last night. She is experiencing a break out skin reaction that she feels is caused by stress. Her financial stressors have increased since last session. Patient is agreeable to [facility] LCSW placing referral for [facility] now but reports that she has no email address. [facility] LCSW completed referral and made note of her inability to get into her email. [facility] LCSW will follow up within two  weeks to ensure that she was enrolled in [facility] counseling program. [facility] completed care coordination call ONLY to [facility] and they were unable to locate patient's referral that was made on 01/19/22. [facility] LCSW resubmitted this referral today on 01/29/22 for telephonic counseling. Patient was made aware by telephone call that [facility] will contact her by the end of the day tomorrow to schedule a time for her to come into their office to complete in person intake paperwork. Patient is agreeable to this plan. UPDATE 02/03/22- Patient reports that she missed a call from June the receptionist at [facility] and call her back but has not heard back yet. [facility] LCSW provided patient with her number and directions on which prompt to enter to be directed to June. Patient will ask June about her phone as patient is unsure if she can make video calls and complete telephonic therapy which would mean she would need to go for in person sessions. Patient is agreeable to plan. Patient was able to find an assistance program for rent through [facility] on her own. Positive reinforcement provided for this resource find. [facility] LCSW collaborated with entire team. [facility] LCSW will follow up in two weeks to ensure that patient was able to get connected with either telephonic or in person therapy services at [facility]. UPDATE 02/23/22-Patient has decided to decline  therapy at this time stating that she "just has too much going on to add in anything else into her schedule." [facility] LCSW will update team on this decision. Reflective and active listening provided during session. [facility] LCSW will follow up in 90 days to ask patient again about mental health support involvement. Patient is agreeable to this plan. 04/24/22- Patient reports that she is still experiencing a skin reaction from off brand dish soap that she used. She reports that this has been going on for months. Healthy self-care education provided to  patient to promote healthy hygiene. Patient reports that she is still not interested in counseling right now but confirms that she is stable. She reports that she is now on oxygen which she started back in the fall of 2023. She shares that she has NOT been smoking. Positive reinforcement provided for this. Patient reports that she put up two Christmas trees this year and one was her mother's. Patient reports that her mother's Christmas tree serves as a reminder of the love and special relationship that she had with her mother.  BSW completed a telephone outreach with patient. She stated she is still itching really bad and thinks she has shingles. She has put up her Christmas decorations and has 2 trees. Patient states she has been stressed about medical bills, but the holidays are keeping her in good spirits. Patient states they are still having financial issues.   Managing Loss, Adult People experience loss in many different ways throughout their lives. Events such as moving, changing jobs, and losing friends can create a sense of loss. The loss may be as serious as a major health change, divorce, death of a pet, or death of a loved one. All of these types of loss are likely to create a physical and emotional reaction known as grief. Grief is the result of a major change or an absence of something or someone that you count on. Grief is a normal reaction to loss. A variety of factors can affect your grieving experience, including: The nature of your loss. Your relationship to what or whom you lost. Your understanding of grief and how to manage it. Your support system. How to manage lifestyle changes Keep to your normal routine as much as possible. If you have trouble focusing or doing normal activities, it is acceptable to take some time away from your normal routine. Spend time with friends and loved ones. Eat a healthy diet, get plenty of sleep, and rest when you feel tired. How to recognize changes   The way that you deal with your grief will affect your ability to function as you normally do. When grieving, you may experience these changes: Numbness, shock, sadness, anxiety, anger, denial, and guilt. Thoughts about death. Unexpected crying. A physical sensation of emptiness in your stomach. Problems sleeping and eating. Tiredness (fatigue). Loss of interest in normal activities. Dreaming about or imagining seeing the person who died. A need to remember what or whom you lost. Difficulty thinking about anything other than your loss for a period of time. Relief. If you have been expecting the loss for a while, you may feel a sense of relief when it happens. Follow these instructions at home:    Activity Express your feelings in healthy ways, such as: Talking with others about your loss. It may be helpful to find others who have had a similar loss, such as a support group. Writing down your feelings in a journal. Doing physical activities to release  stress and emotional energy. Doing creative activities like painting, sculpting, or playing or listening to music. Practicing resilience. This is the ability to recover and adjust after facing challenges. Reading some resources that encourage resilience may help you to learn ways to practice those behaviors. General instructions Be patient with yourself and others. Allow the grieving process to happen, and remember that grieving takes time. It is likely that you may never feel completely done with some grief. You may find a way to move on while still cherishing memories and feelings about your loss. Accepting your loss is a process. It can take months or longer to adjust. Keep all follow-up visits as told by your health care provider. This is important. Where to find support To get support for managing loss: Ask your health care provider for help and recommendations, such as grief counseling or therapy. Think about joining a support group  for people who are managing a loss. Where to find more information You can find more information about managing loss from: American Society of Clinical Oncology: www.cancer.net American Psychological Association: TVStereos.ch Contact a health care provider if: Your grief is extreme and keeps getting worse. You have ongoing grief that does not improve. Your body shows symptoms of grief, such as illness. You feel depressed, anxious, or lonely. Get help right away if: You have thoughts about hurting yourself or others. If you ever feel like you may hurt yourself or others, or have thoughts about taking your own life, get help right away. You can go to your nearest emergency department or call: Your local emergency services (911 in the [place] A suicide crisis helpline, such as the [place] at (581) 114-8204. This is open 24 hours a day. Summary Grief is the result of a major change or an absence of someone or something that you count on. Grief is a normal reaction to loss. The depth of grief and the period of recovery depend on the type of loss and your ability to adjust to the change and process your feelings. Processing grief requires patience and a willingness to accept your feelings and talk about your loss with people who are supportive. It is important to find resources that work for you and to realize that people experience grief differently. There is not one grieving process that works for everyone in the same way. Be aware that when grief becomes extreme, it can lead to more severe issues like isolation, depression, anxiety, or suicidal thoughts. Talk with your health care provider if you have any of these issues. This information is not intended to replace advice given to you by your health care provider. Make sure you discuss any questions you have with your health care provider. Document Revised: 06/17/2018 Document Reviewed: 08/27/2016 Elsevier Patient  Education  [place] for Managing Grief   When you are experiencing grief, many resources and support are available to help you. You are not alone.    Grief Share (https://www.https://jacobson-moore.net/):    [employer] [facility] [place], [place]  [phone] [address] [place], [place], [place], [place]   [place], [place]   [facility] [address] [place], [place]  [place], [place]   If a Hospice or Palliative Care team has been involved in the care of your loved one, you may reach out to your contact there or locally, you may reach out to [facility] [place] [facility]   [place], [place] [address] [phone] Patient was educated on healthy self-care as she admits she has little motivation to walk lately. PCS recommendation was suggested.   Patient Self Care Activities:  Attends all scheduled provider appointments Calls provider office for new concerns or questions  Please see past updates related to this goal by clicking on the "Past Updates" button in the selected goal        Follow up:  Patient agrees to Care Plan and Follow-up.  Plan: The Managed Medicaid care management team will reach out to the patient again over the next 90 days.  Date/time of next scheduled Social Work care management/care coordination outreach:  06/25/22 at [place], [place], MSW, [place] Medicaid LCSW [email or web address] Phone: [phone]

## 2022-04-24 NOTE — Patient Instructions (Signed)
Visit Information  Ms. Sedivy was given information about Medicaid Managed Care team care coordination services as a part of their [facility] Medicaid benefit. Caroleann Imogen Maddalena verbally consented to engagement with the [facility] Managed Care team.   If you are experiencing a medical emergency, please call 911 or report to your local emergency department or urgent care.   If you have a non-emergency medical problem during routine business hours, please contact your provider's office and ask to speak with a nurse.   For questions related to your [facility] health plan, please call: [phone] or go here:[email or web address]  If you would like to schedule transportation through your [facility] plan, please call the following number at least 2 days in advance of your appointment: [phone].  You can also use the MTM portal or MTM mobile app to manage your rides. For the portal, please go to [email or web address]  Call the [facility] Crisis Line at [phone], at any time, 24 hours a day, 7 days a week. If you are in danger or need immediate medical attention call 911.  If you would like help to quit smoking, call 1-800-QUIT-NOW ((754)074-5217) OR Espaol: 1-855-Djelo-Ya (4-196-222-9798) o para ms informacin haga clic aqu or Text READY to 921-194 to register via text   Following is a copy of your plan of care:  Care Plan : General Social Work (Adult)  Updates made by Gustavus Bryant, LCSW since 04/24/2022 12:00 AM     Problem: Coping Skills (General Plan of Care)      Long-Range Goal: Coping Skills Enhanced   Start Date: 11/07/2020  Expected End Date: 03/07/2022  Recent Progress: On track  Priority: High  Note:   Timeframe:  Long-Range Goal Priority:  High Start Date:       08/26/21                    Expected End Date:  ongoing   Current Barriers:  Financial constraints Mental Health Concerns  Social Isolation Limited education about mental  health support resources that are available to her within the local area* Cognitive Deficits Lacks knowledge of community resource   Clinical Social Work Clinical Goal(s):  Over the next 90 days, client will work with SW to address concerns related to experiencing ongoing symptoms of depression, sadness and grief since the passing of her mother Over the next 90 days, patient will work with LCSW to address needs related to implementing appropriate self-care and depression management.    Managing Loss, Adult People experience loss in many different ways throughout their lives. Events such as moving, changing jobs, and losing friends can create a sense of loss. The loss may be as serious as a major health change, divorce, death of a pet, or death of a loved one. All of these types of loss are likely to create a physical and emotional reaction known as grief. Grief is the result of a major change or an absence of something or someone that you count on. Grief is a normal reaction to loss. A variety of factors can affect your grieving experience, including: The nature of your loss. Your relationship to what or whom you lost. Your understanding of grief and how to manage it. Your support system. How to manage lifestyle changes Keep to your normal routine as much as possible. If you have trouble focusing or doing normal activities, it is acceptable to take some time away from your normal routine. Spend time with friends and loved ones. Eat  a healthy diet, get plenty of sleep, and rest when you feel tired. How to recognize changes  The way that you deal with your grief will affect your ability to function as you normally do. When grieving, you may experience these changes: Numbness, shock, sadness, anxiety, anger, denial, and guilt. Thoughts about death. Unexpected crying. A physical sensation of emptiness in your stomach. Problems sleeping and eating. Tiredness (fatigue). Loss of interest in  normal activities. Dreaming about or imagining seeing the person who died. A need to remember what or whom you lost. Difficulty thinking about anything other than your loss for a period of time. Relief. If you have been expecting the loss for a while, you may feel a sense of relief when it happens. Follow these instructions at home:    Activity Express your feelings in healthy ways, such as: Talking with others about your loss. It may be helpful to find others who have had a similar loss, such as a support group. Writing down your feelings in a journal. Doing physical activities to release stress and emotional energy. Doing creative activities like painting, sculpting, or playing or listening to music. Practicing resilience. This is the ability to recover and adjust after facing challenges. Reading some resources that encourage resilience may help you to learn ways to practice those behaviors. General instructions Be patient with yourself and others. Allow the grieving process to happen, and remember that grieving takes time. It is likely that you may never feel completely done with some grief. You may find a way to move on while still cherishing memories and feelings about your loss. Accepting your loss is a process. It can take months or longer to adjust. Keep all follow-up visits as told by your health care provider. This is important. Where to find support To get support for managing loss: Ask your health care provider for help and recommendations, such as grief counseling or therapy. Think about joining a support group for people who are managing a loss. Where to find more information You can find more information about managing loss from: American Society of Clinical Oncology: www.cancer.net American Psychological Association: TVStereos.ch Contact a health care provider if: Your grief is extreme and keeps getting worse. You have ongoing grief that does not improve. Your body shows  symptoms of grief, such as illness. You feel depressed, anxious, or lonely. Get help right away if: You have thoughts about hurting yourself or others. If you ever feel like you may hurt yourself or others, or have thoughts about taking your own life, get help right away. You can go to your nearest emergency department or call: Your local emergency services (911 in the [place].S[place] A suicide crisis helpline, such as the [place] at 385-411-1773. This is open 24 hours a day. Summary Grief is the result of a major change or an absence of someone or something that you count on. Grief is a normal reaction to loss. The depth of grief and the period of recovery depend on the type of loss and your ability to adjust to the change and process your feelings. Processing grief requires patience and a willingness to accept your feelings and talk about your loss with people who are supportive. It is important to find resources that work for you and to realize that people experience grief differently. There is not one grieving process that works for everyone in the same way. Be aware that when grief becomes extreme, it can lead to more severe issues  like isolation, depression, anxiety, or suicidal thoughts. Talk with your health care provider if you have any of these issues. This information is not intended to replace advice given to you by your health care provider. Make sure you discuss any questions you have with your health care provider. Document Revised: 06/17/2018 Document Reviewed: 08/27/2016 Elsevier Patient Education  [place] for Managing Grief   When you are experiencing grief, many resources and support are available to help you. You are not alone.    Grief Share (https://www.https://jacobson-moore.net/):    [employer] [place], [place]  [phone] [address]. [place], [place], [place], [place], [place]   [facility] [address] [place], [place]   [place], [place]   If a Hospice or [place] team has been involved in the care of your loved one, you may reach out to your contact there or locally, you may reach out to [facility] of [facility]:    [facility] [place] [facility]   [place], [place] [address] [phone] Patient was educated on healthy self-care as she admits she has little motivation to walk lately. PCS recommendation was suggested.   Patient Self Care Activities:  Attends all scheduled provider appointments Calls provider office for new concerns or questions  Please see past updates related to this goal by clicking on the "Past Updates" button in the selected goal

## 2022-04-24 NOTE — Telephone Encounter (Signed)
ONO on 2L reviewed by Dr. Jayme Cloud- recommend increasing to 3L QHS.   Patient is aware of results and voiced her understanding.  Order placed to [facility].  Nothing further needed.

## 2022-04-25 DIAGNOSIS — G4736 Sleep related hypoventilation in conditions classified elsewhere: Secondary | ICD-10-CM | POA: Diagnosis not present

## 2022-04-27 DIAGNOSIS — Z419 Encounter for procedure for purposes other than remedying health state, unspecified: Secondary | ICD-10-CM | POA: Diagnosis not present

## 2022-04-28 ENCOUNTER — Telehealth: Payer: Self-pay

## 2022-04-28 NOTE — Telephone Encounter (Addendum)
I spoke with the patient. She would like to get a POC. I told her we will have to see her in the office and do Ambulatory Oximetry test on her. She said she is confined to a wheelchair and can not walk. I told her we would have to talk to Dr. Patsey Berthold about it at her next appointment on 06/02/2021.  Nothing further needed.

## 2022-04-28 NOTE — Telephone Encounter (Signed)
Vivi Ferns, CMA Please see message below  Thanks Rodena Piety       Previous Messages    ----- Message ----- From: Jaynee Eagles Sent: 04/25/2022  10:05 AM EST To: Sunnie Nielsen; Tana Coast Subject: RE: Increase 02 from 2 liters to 3 liters      Patient is asking about portable oxygen. I told her that I would message you in regard to this. I explained that she would need to be seen and have an order sent over to add portability. I also explained that she would have to start with portable tanks and use 8 tanks per month for 3 months in a row to qualify for the POC. She stated that she understood and was in agreement with the plan. She stated she would be contacting the provider as well.  ----- Message ----- From: Tana Coast Sent: 04/24/2022   4:35 PM EST To: Sunnie Nielsen; Raiford Noble Ankrum; Terri Coltrane; * Subject: Increase 02 from 2 liters to 3 liters          DOB: 1965-11-18  Order placed by Dr. Patsey Berthold.  Please advise.  Thank you, Rodena Piety

## 2022-04-30 NOTE — Progress Notes (Signed)
BP 124/63 (BP Location: Left Arm, Cuff Size: Large)   Pulse 64   Temp 98.2 F (36.8 C) (Oral)   LMP 06/09/2017 (Approximate)   SpO2 97%    Subjective:    Patient ID: Cheryl Chandler, female    DOB: [birth year], 57 y.o.   MRN: [id number]  HPI: Cheryl Chandler is a 57 y.o. female  Chief Complaint  Patient presents with   Diabetes   Hyperlipidemia   Hypertension   Hypothyroidism   HYPERTENSION / [place] Satisfied with current treatment? yes Duration of hypertension: years BP monitoring frequency: daily BP range: 170/80-90 BP medication side effects: no Past BP meds:  lasix and lisinopril, hydralazine Duration of hyperlipidemia: years Cholesterol medication side effects: no Cholesterol supplements: none Past cholesterol medications: simvastatin (zocor) Medication compliance: excellent compliance Aspirin: no Recent stressors: no Recurrent headaches: no Visual changes: no Palpitations: no Dyspnea: yes Chest pain: no Lower extremity edema: yes Dizzy/lightheaded: no Following with the lymphedema specialist.   COPD Does not have portable oxygen to bring with her to appointments. She is using the oxygen at night and during the day.  Patient state she is no longer smoking.  COPD status: controlled Satisfied with current treatment?: yes Oxygen use: yes- 3L  Dyspnea frequency: yes Cough frequency: daily Rescue inhaler frequency: has cut back from 4-3 times daily Limitation of activity: yes Productive cough:  Last Spirometry:  Pneumovax: Up to Date Influenza: Up to Date  HYPOTHYROIDISM Thyroid control status:controlled Satisfied with current treatment? yes Medication side effects: no Medication compliance: excellent compliance Etiology of hypothyroidism:  Recent dose adjustment:no Fatigue: yes Cold intolerance: no Heat intolerance: no Weight gain: no Weight loss: no Constipation: no Diarrhea/loose stools: no Palpitations: no Lower  extremity edema: yes Anxiety/depressed mood: no  DEPRESSION/ANXIETY Patient states she feels like her mood is doing okay.  She has been stressed lately about money.  States it has been difficult to make their bills.  Has a lot going on in her life that is causing her to worry a lot.  Denies SI.  [place] Office Visit from 05/01/2022 in [place]  PHQ-9 Total Score 16         05/01/2022   10:30 AM 03/17/2022   10:42 AM 12/16/2021   11:39 AM 10/22/2021    2:28 PM  GAD 7 : Generalized Anxiety Score  Nervous, Anxious, on Edge 2 0 2 2  Control/stop worrying _0 Worry too much - different things _1 Trouble relaxing _2 Restless 2 0 0 1  Easily annoyed or irritable 2 3 0 2  Afraid - awful might happen 2 2 0 3  Total GAD 7 Score _3 Anxiety Difficulty Somewhat difficult Somewhat difficult Somewhat difficult Very difficult   RASH Patient has broken out on her skin.  This has been going on for several months.  She has scabs on her face and her body.  The itching has improved.  She used off brand dish soap to bath in and thinks that made her symptoms worse.    Relevant past medical, surgical, family and social history reviewed and updated as indicated. Interim medical history since our last visit reviewed. Allergies and medications reviewed and updated.  Review of Systems  Constitutional:  Negative for fever and unexpected weight change.  Eyes:  Negative for visual disturbance.  Respiratory:  Negative for cough, chest tightness and shortness of breath.  Cardiovascular:  Positive for leg swelling. Negative for chest pain and palpitations.  Endocrine: Negative for cold intolerance.  Neurological:  Negative for dizziness and headaches.  Psychiatric/Behavioral:  Positive for dysphoric mood. Negative for suicidal ideas. The patient is nervous/anxious.     Per HPI unless specifically indicated above     Objective:    BP 124/63 (BP Location:  Left Arm, Cuff Size: Large)   Pulse 64   Temp 98.2 F (36.8 C) (Oral)   LMP 06/09/2017 (Approximate)   SpO2 97%   Wt Readings from Last 3 Encounters:  02/02/22 (!) 320 lb (145.2 kg)  07/28/21 (!) 320 lb (145.2 kg)  06/04/21 (!) 331 lb (150.1 kg)    Physical Exam Vitals and nursing note reviewed.  Constitutional:      General: She is not in acute distress.    Appearance: Normal appearance. She is normal weight. She is not ill-appearing, toxic-appearing or diaphoretic.  HENT:     Head: Normocephalic.     Right Ear: External ear normal.     Left Ear: External ear normal.     Nose: Nose normal.     Mouth/Throat:     Mouth: Mucous membranes are moist.     Pharynx: Oropharynx is clear.  Eyes:     General:        Right eye: No discharge.        Left eye: No discharge.     Extraocular Movements: Extraocular movements intact.     Conjunctiva/sclera: Conjunctivae normal.     Pupils: Pupils are equal, round, and reactive to light.  Cardiovascular:     Rate and Rhythm: Normal rate and regular rhythm.     Heart sounds: No murmur heard. Pulmonary:     Effort: Pulmonary effort is normal. No respiratory distress.     Breath sounds: Decreased breath sounds present. No wheezing or rales.  Musculoskeletal:     Cervical back: Normal range of motion and neck supple.     Right lower leg: Edema present.     Left lower leg: Edema present.     Comments: Swelling has improved some from last visit.   Skin:    General: Skin is warm and dry.     Capillary Refill: Capillary refill takes less than 2 seconds.     Findings: Rash present. Rash is papular.  Neurological:     General: No focal deficit present.     Mental Status: She is alert and oriented to person, place, and time. Mental status is at baseline.  Psychiatric:        Mood and Affect: Mood normal.        Behavior: Behavior normal.        Thought Content: Thought content normal.        Judgment: Judgment normal.     Results for  orders placed or performed in visit on 17/00/17  Basic Metabolic Panel (BMET)  Result Value Ref Range   Glucose 104 (H) 70 - 99 mg/dL   BUN 20 6 - 24 mg/dL   Creatinine, Ser 0.87 0.57 - 1.00 mg/dL   eGFR 79 >59 mL/min/1.73   BUN/Creatinine Ratio 23 9 - 23   Sodium 142 134 - 144 mmol/L   Potassium 4.2 3.5 - 5.2 mmol/L   Chloride 104 96 - 106 mmol/L   CO2 23 20 - 29 mmol/L   Calcium 8.7 8.7 - 10.2 mg/dL      Assessment & Plan:   Problem List Items Addressed  This Visit       Cardiovascular and Mediastinum   Essential hypertension    Chronic. Not well controlled.  Will increase Hydralazine to 20m TID.  Continue with other medications.  Will hold off on increasing Amlodipine due to swelling in lower extremities.  Labs ordered today.  Follow up in 3 months.  Continue to check blood pressures at home.       Relevant Medications   hydrALAZINE (APRESOLINE) 50 MG tablet   Other Relevant Orders   Comp Met (CMET)   Chronic venous insufficiency    Chronic. Not well controlled.  Not using the wraps given to her by specialist.  Has an upcoming appointment with specialist.  Encouraged her to keep appt.       Relevant Medications   hydrALAZINE (APRESOLINE) 50 MG tablet     Respiratory   COPD (chronic obstructive pulmonary disease) (HCC)    Chronic.  On 3L of O2 at home.  Working with Pulmonology.  Recommend getting portable oxygen to take with her when she goes places.          Endocrine   Adult hypothyroidism    Chronic.  Controlled.  Continue with current medication regimen.  Labs ordered today.  Return to clinic in 6 months for reevaluation.  Call sooner if concerns arise.        Relevant Orders   TSH   T4, free   IFG (impaired fasting glucose)    Chronic.  Controlled.  Continue with current medication regimen.  Labs ordered today.  Return to clinic in 6 months for reevaluation.  Call sooner if concerns arise.        Relevant Orders   HgB A1c     Other   HLD  (hyperlipidemia)    Chronic.  Controlled.  Continue with current medication regimen.  Labs ordered today.  Return to clinic in 6 months for reevaluation.  Call sooner if concerns arise.        Relevant Medications   hydrALAZINE (APRESOLINE) 50 MG tablet   Other Relevant Orders   Lipid Profile   Morbid obesity ([facility] - Primary    Recommended eating smaller high protein, low fat meals more frequently and exercising 30 mins a day 5 times a week with a goal of 10-15lb weight loss in the next 3 months.       Vitamin D deficiency    Labs ordered at visit today.  Will make recommendations based on lab results.        Relevant Orders   Vitamin D (25 hydroxy)   Other Visit Diagnoses     Rash       Ongoing x several months. Will refer to dermatology.   Relevant Orders   Ambulatory referral to Dermatology        Follow up plan: Return in about 3 months (around 07/31/2022) for HTN, HLD, DM2 FU.

## 2022-05-01 ENCOUNTER — Encounter: Payer: Self-pay | Admitting: Nurse Practitioner

## 2022-05-01 ENCOUNTER — Ambulatory Visit (INDEPENDENT_AMBULATORY_CARE_PROVIDER_SITE_OTHER): Payer: Medicaid Other | Admitting: Nurse Practitioner

## 2022-05-01 DIAGNOSIS — I872 Venous insufficiency (chronic) (peripheral): Secondary | ICD-10-CM

## 2022-05-01 DIAGNOSIS — E785 Hyperlipidemia, unspecified: Secondary | ICD-10-CM

## 2022-05-01 DIAGNOSIS — E039 Hypothyroidism, unspecified: Secondary | ICD-10-CM

## 2022-05-01 DIAGNOSIS — J449 Chronic obstructive pulmonary disease, unspecified: Secondary | ICD-10-CM

## 2022-05-01 DIAGNOSIS — I10 Essential (primary) hypertension: Secondary | ICD-10-CM | POA: Diagnosis not present

## 2022-05-01 DIAGNOSIS — R7301 Impaired fasting glucose: Secondary | ICD-10-CM

## 2022-05-01 DIAGNOSIS — R21 Rash and other nonspecific skin eruption: Secondary | ICD-10-CM

## 2022-05-01 DIAGNOSIS — E559 Vitamin D deficiency, unspecified: Secondary | ICD-10-CM | POA: Diagnosis not present

## 2022-05-01 LAB — MICROALBUMIN, URINE WAIVED
Creatinine, Urine Waived: 50 mg/dL (ref 10–300)
Microalb, Ur Waived: 10 mg/L (ref 0–19)

## 2022-05-01 MED ORDER — HYDRALAZINE HCL 50 MG PO TABS: 50.0000 mg | ORAL_TABLET | Freq: Three times a day (TID) | ORAL | 1 refills | Status: DC

## 2022-05-01 NOTE — Assessment & Plan Note (Signed)
Chronic.  Controlled.  Continue with current medication regimen.  Labs ordered today.  Return to clinic in 6 months for reevaluation.  Call sooner if concerns arise.  ? ?

## 2022-05-01 NOTE — Addendum Note (Signed)
Addended by: [name] on: 05/01/2022 10:59 AM   Modules accepted: Orders

## 2022-05-01 NOTE — Assessment & Plan Note (Signed)
Chronic. Not well controlled.  Not using the wraps given to her by specialist.  Has an upcoming appointment with specialist.  Encouraged her to keep appt.

## 2022-05-01 NOTE — Assessment & Plan Note (Signed)
Labs ordered at visit today.  Will make recommendations based on lab results.   

## 2022-05-01 NOTE — Assessment & Plan Note (Signed)
Recommended eating smaller high protein, low fat meals more frequently and exercising 30 mins a day 5 times a week with a goal of 10-15lb weight loss in the next 3 months.  

## 2022-05-01 NOTE — Assessment & Plan Note (Signed)
Chronic. Not well controlled.  Will increase Hydralazine to 50mg  TID.  Continue with other medications.  Will hold off on increasing Amlodipine due to swelling in lower extremities.  Labs ordered today.  Follow up in 3 months.  Continue to check blood pressures at home.

## 2022-05-01 NOTE — Assessment & Plan Note (Signed)
Chronic.  On 3L of O2 at home.  Working with Pulmonology.  Recommend getting portable oxygen to take with her when she goes places.

## 2022-05-02 LAB — COMPREHENSIVE METABOLIC PANEL
ALT: 18 IU/L (ref 0–32)
AST: 14 IU/L (ref 0–40)
Albumin/Globulin Ratio: 1.2 (ref 1.2–2.2)
Albumin: 4 g/dL (ref 3.8–4.9)
Alkaline Phosphatase: 104 IU/L (ref 44–121)
BUN/Creatinine Ratio: 22 (ref 9–23)
BUN: 16 mg/dL (ref 6–24)
Bilirubin Total: 0.4 mg/dL (ref 0.0–1.2)
CO2: 24 mmol/L (ref 20–29)
Calcium: 9 mg/dL (ref 8.7–10.2)
Chloride: 100 mmol/L (ref 96–106)
Creatinine, Ser: 0.73 mg/dL (ref 0.57–1.00)
Globulin, Total: 3.3 g/dL (ref 1.5–4.5)
Glucose: 94 mg/dL (ref 70–99)
Potassium: 4.5 mmol/L (ref 3.5–5.2)
Sodium: 138 mmol/L (ref 134–144)
Total Protein: 7.3 g/dL (ref 6.0–8.5)
eGFR: 97 mL/min/{1.73_m2} (ref >59)

## 2022-05-02 LAB — T4, FREE: Free T4: 1.03 ng/dL (ref 0.82–1.77)

## 2022-05-02 LAB — LIPID PANEL
Chol/HDL Ratio: 3.5 ratio (ref 0.0–4.4)
Cholesterol, Total: 136 mg/dL (ref 100–199)
HDL: 39 mg/dL — ABNORMAL LOW (ref >39)
LDL Chol Calc (NIH): 80 mg/dL (ref 0–99)
Triglycerides: 91 mg/dL (ref 0–149)
VLDL Cholesterol Cal: 17 mg/dL (ref 5–40)

## 2022-05-02 LAB — HEMOGLOBIN A1C
Est. average glucose Bld gHb Est-mCnc: 128 mg/dL
Hgb A1c MFr Bld: 6.1 % — ABNORMAL HIGH (ref 4.8–5.6)

## 2022-05-02 LAB — VITAMIN D 25 HYDROXY (VIT D DEFICIENCY, FRACTURES): Vit D, 25-Hydroxy: 26.3 ng/mL — ABNORMAL LOW (ref 30.0–100.0)

## 2022-05-02 LAB — TSH: TSH: 2.29 u[IU]/mL (ref 0.450–4.500)

## 2022-05-03 DIAGNOSIS — L97909 Non-pressure chronic ulcer of unspecified part of unspecified lower leg with unspecified severity: Secondary | ICD-10-CM | POA: Insufficient documentation

## 2022-05-03 DIAGNOSIS — I83009 Varicose veins of unspecified lower extremity with ulcer of unspecified site: Secondary | ICD-10-CM | POA: Insufficient documentation

## 2022-05-03 NOTE — Progress Notes (Unsigned)
MRN : [id number]  Cheryl Chandler is a 57 y.o. (January 22, 1966) female who presents with chief complaint of legs hurt and swell.  History of Present Illness:   Patient is seen for follow up evaluation of leg pain and swelling associated with venous ulceration. The patient was recently seen here and started on [place]na boot therapy.  The swelling abruptly became much worse bilaterally and is associated with pain and discoloration. The pain and swelling worsens with prolonged dependency and improves with elevation.  The patient notes that in the morning the legs are better but the leg symptoms worsened throughout the course of the day. The patient has also noted a progressive worsening of the discoloration in the ankle and shin area.   The patient notes that an ulcer has developed acutely without specific trauma and since it occurred it has been very slow to heal.  There is a moderate amount of drainage associated with the open area.  The wound is also painful.  The patient states that they have been elevating as much as possible. The patient denies any recent changes in medications.  The patient denies a history of DVT or PE. There is no prior history of phlebitis. There is no history of primary lymphedema.  No SOB or increased cough.  No sputum production.  No recent episodes of CHF exacerbation.  No outpatient medications have been marked as taking for the 05/04/22 encounter (Appointment) with Gilda Crease, Latina Craver, MD.    Past Medical History:  Diagnosis Date   Arthritis    Asthma    COPD (chronic obstructive pulmonary disease) (HCC)    Hypertension    Neuropathy    Pericarditis    Personal history of urinary calculi 11/29/2014   Thyroid disease     Past Surgical History:  Procedure Laterality Date   APPENDECTOMY     arm surgery Left    pins   fatty tumor removed from back     JOINT REPLACEMENT Right    LEG SURGERY Bilateral    OVARIAN CYST SURGERY Right    took piece  of right ovary due to cyst   TOTAL HIP ARTHROPLASTY Right    wisdom teeth removed      Social History Social History   Tobacco Use   Smoking status: Former    Packs/day: 3.00    Years: 30.00    Total pack years: 90.00    Types: Cigarettes   Smokeless tobacco: Never   Tobacco comments:    0.5 PPD. 02/02/2022  Vaping Use   Vaping Use: Never used  Substance Use Topics   Alcohol use: No    Alcohol/week: 0.0 standard drinks of alcohol   Drug use: No    Family History Family History  Problem Relation Age of Onset   Seizures Sister        disorders    Allergies  Allergen Reactions   Codeine Shortness Of Breath and Rash   Percocet [Oxycodone-Acetaminophen] Shortness Of Breath   Sulfa Antibiotics Shortness Of Breath and Rash   Aspirin Hives   Bee Venom     Bee stings   Darvon [Propoxyphene]     Darvocet-N 100   Latex    Oxycodone Nausea And Vomiting   Penicillins     Has patient had a PCN reaction causing immediate rash, facial/tongue/throat swelling, SOB or lightheadedness with hypotension: Unknown Has patient had a PCN reaction causing severe rash involving mucus membranes or  skin necrosis: Unknown Has patient had a PCN reaction that required hospitalization: Unknown Has patient had a PCN reaction occurring within the last 10 years: Unknown If all of the above answers are "NO", then may proceed with Cephalosporin use.     REVIEW OF SYSTEMS (Negative unless checked)  Constitutional: [] Weight loss  [] Fever  [] Chills Cardiac: [] Chest pain   [] Chest pressure   [] Palpitations   [] Shortness of breath when laying flat   [] Shortness of breath with exertion. Vascular:  [] Pain in legs with walking   [x] Pain in legs at rest  [] History of DVT   [] Phlebitis   [x] Swelling in legs   [] Varicose veins   [] Non-healing ulcers Pulmonary:   [] Uses home oxygen   [] Productive cough   [] Hemoptysis   [] Wheeze  [x] COPD   [] Asthma Neurologic:  [] Dizziness   [] Seizures   [] History of stroke    [] History of TIA  [] Aphasia   [] Vissual changes   [] Weakness or numbness in arm   [] Weakness or numbness in leg Musculoskeletal:   [] Joint swelling   [] Joint pain   [] Low back pain Hematologic:  [] Easy bruising  [] Easy bleeding   [] Hypercoagulable state   [] Anemic Gastrointestinal:  [] Diarrhea   [] Vomiting  [x] Gastroesophageal reflux/heartburn   [] Difficulty swallowing. Genitourinary:  [] Chronic kidney disease   [] Difficult urination  [] Frequent urination   [] Blood in urine Skin:  [] Rashes   [] Ulcers  Psychological:  [] History of anxiety   []  History of major depression.  Physical Examination  There were no vitals filed for this visit. There is no height or weight on file to calculate BMI. Gen: WD/WN, NAD Head: [place]/AT, No temporalis wasting.  Ear/Nose/Throat: Hearing grossly intact, nares w/o erythema or drainage, pinna without lesions Eyes: PER, EOMI, sclera nonicteric.  Neck: Supple, no gross masses.  No JVD.  Pulmonary:  Good air movement, no audible wheezing, no use of accessory muscles.  Cardiac: RRR, precordium not hyperdynamic. Vascular:  scattered varicosities present bilaterally.  Moderate venous stasis changes to the legs bilaterally.  2+ soft pitting edema  Vessel Right Left  Radial Palpable Palpable  Gastrointestinal: soft, non-distended. No guarding/no peritoneal signs.  Musculoskeletal: M/S 5/5 throughout.  No deformity.  Neurologic: CN 2-12 intact. Pain and light touch intact in extremities.  Symmetrical.  Speech is fluent. Motor exam as listed above. Psychiatric: Judgment intact, Mood & affect appropriate for pt's clinical situation. Dermatologic: Venous rashes no ulcers noted.  No changes consistent with cellulitis. Lymph : No lichenification or skin changes of chronic lymphedema.  CBC Lab Results  Component Value Date   WBC 10.8 (H) 07/28/2021   HGB 13.6 07/28/2021   HCT 43.3 07/28/2021   MCV 90.4 07/28/2021   PLT 378 07/28/2021    BMET    Component Value  Date/Time   NA 138 05/01/2022 1031   NA 135 08/05/2014 1306   K 4.5 05/01/2022 1031   K 4.5 08/05/2014 1306   CL 100 05/01/2022 1031   CL 102 08/05/2014 1306   CO2 24 05/01/2022 1031   CO2 25 08/05/2014 1306   GLUCOSE 94 05/01/2022 1031   GLUCOSE 104 (H) 07/28/2021 1832   GLUCOSE 136 (H) 08/05/2014 1306   BUN 16 05/01/2022 1031   BUN 23 (H) 08/05/2014 1306   CREATININE 0.73 05/01/2022 1031   CREATININE 0.86 08/05/2014 1306   CALCIUM 9.0 05/01/2022 1031   CALCIUM 8.2 (L) 08/05/2014 1306   GFRNONAA >60 07/28/2021 1832   GFRNONAA >60 08/05/2014 1306   GFRAA 92 04/03/2020 1637  GFRAA >60 08/05/2014 1306   CrCl cannot be calculated (Unknown ideal weight.).  COAG No results found for: "INR", "PROTIME"  Radiology No results found.   Assessment/Plan There are no diagnoses linked to this encounter.   Hortencia Pilar, MD  05/03/2022 11:31 AM

## 2022-05-04 ENCOUNTER — Other Ambulatory Visit: Payer: Medicaid Other

## 2022-05-04 ENCOUNTER — Encounter (INDEPENDENT_AMBULATORY_CARE_PROVIDER_SITE_OTHER): Payer: Self-pay | Admitting: Vascular Surgery

## 2022-05-04 ENCOUNTER — Ambulatory Visit (INDEPENDENT_AMBULATORY_CARE_PROVIDER_SITE_OTHER): Payer: Medicaid Other | Admitting: Vascular Surgery

## 2022-05-04 VITALS — BP 188/65 | HR 70 | Resp 17 | Ht 63.0 in | Wt 280.0 lb

## 2022-05-04 DIAGNOSIS — L97909 Non-pressure chronic ulcer of unspecified part of unspecified lower leg with unspecified severity: Secondary | ICD-10-CM

## 2022-05-04 DIAGNOSIS — I872 Venous insufficiency (chronic) (peripheral): Secondary | ICD-10-CM

## 2022-05-04 DIAGNOSIS — I83009 Varicose veins of unspecified lower extremity with ulcer of unspecified site: Secondary | ICD-10-CM

## 2022-05-04 DIAGNOSIS — J449 Chronic obstructive pulmonary disease, unspecified: Secondary | ICD-10-CM | POA: Diagnosis not present

## 2022-05-04 DIAGNOSIS — I10 Essential (primary) hypertension: Secondary | ICD-10-CM | POA: Diagnosis not present

## 2022-05-04 DIAGNOSIS — E785 Hyperlipidemia, unspecified: Secondary | ICD-10-CM

## 2022-05-04 NOTE — Patient Instructions (Signed)
Visit Information  Cheryl Chandler was given information about Medicaid Managed Care team care coordination services as a part of their [facility] Medicaid benefit. Cheryl Chandler verbally consented to engagement with the [facility] Managed Care team.   If you are experiencing a medical emergency, please call 911 or report to your local emergency department or urgent care.   If you have a non-emergency medical problem during routine business hours, please contact your provider's office and ask to speak with a nurse.   For questions related to your [facility] health plan, please call: [phone] or go here:[email or web address]  If you would like to schedule transportation through your [facility] plan, please call the following number at least 2 days in advance of your appointment: [phone].  You can also use the MTM portal or MTM mobile app to manage your rides. For the portal, please go to [email or web address]  Call the [facility] Crisis Line at [phone], at any time, 24 hours a day, 7 days a week. If you are in danger or need immediate medical attention call 911.  If you would like help to quit smoking, call 1-800-QUIT-NOW (7206852139) OR Espaol: 1-855-Djelo-Ya (8-250-037-0488) o para ms informacin haga clic aqu or Text READY to 891-694 to register via text  Cheryl Chandler - following are the goals we discussed in your visit today:   Goals Addressed   None      Social Worker will follow up on 06/01/22.   [name], BSW, [facility]  [place]  High Risk Managed Medicaid Team  [phone]   Following is a copy of your plan of care:  Care Plan : General Social Work (Adult)  Updates made by [name] since 05/04/2022 12:00 AM     Problem: Coping Skills (General Plan of Care)      Long-Range Goal: Coping Skills Enhanced   Start Date: 11/07/2020  Expected End Date: 03/07/2022  Recent Progress: On track   Priority: High  Note:   Timeframe:  Long-Range Goal Priority:  High Start Date:       08/26/21                    Expected End Date:  ongoing   Current Barriers:  Financial constraints Mental Health Concerns  Social Isolation Limited education about mental health support resources that are available to her within the local area* Cognitive Deficits Lacks knowledge of community resource   Clinical Social Work Clinical Goal(s):  Over the next 90 days, client will work with SW to address concerns related to experiencing ongoing symptoms of depression, sadness and grief since the passing of her mother Over the next 90 days, Cheryl Chandler will work with LCSW to address needs related to implementing appropriate self-care and depression management.    Interventions: Cheryl Chandler interviewed and appropriate assessments performed Provided mental health counseling with regards to Cheryl Chandler's loss and ongoing stressors. Cheryl Chandler was very close with her mother who passed away a few years ago and she continues to experience symptoms of grief from this loss. Cheryl Chandler denies wanting LCSW to make a referral for grief counseling through [facility] again but was receptive to coping skill and resource education/information that was provided during session today. Cheryl Chandler reports decorating for the holidays helps her to connect with her mother as they did this together when she was alive.  Provided Cheryl Chandler with information about managing depression within her daily life. Cheryl Chandler has ongoing grief symptoms as well. Cheryl Chandler states "I feel worn  down." Cheryl Chandler reports that she talks out loud to her mother's picture and this is very helpful to her. Grief support resource education provided again during session today.  Provided reflective listening and implemented appropriate interventions to help support Cheryl Chandler and her emotional needs  Discussed plans with Cheryl Chandler for ongoing care management follow up and provided Cheryl Chandler with  direct contact information for [facility] Advised Cheryl Chandler to contact [facility] team with any urgent case management needs. Assisted Cheryl Chandler/caregiver with obtaining information about health plan benefits LCSW completed past referral for family to have a wheelchair ramp built at their residence due to ongoing mobility concerns. Ramp has been successfully installed.  PCS actively involved with Cheryl Chandler and providing ongoing services.  Positive reinforcement provided to Cheryl Chandler for quitting smoking cigarettes. Coping skill review education provided for future triggers.  Brief self-care education provided to both Cheryl Chandler and spouse. Cheryl Chandler confirms that she continues to go to food pantry for food insecurity. Resource information has been provided.  Cheryl Chandler reports ongoing pain and issues with mobility. Cheryl Chandler shares that her pain resides in her pains and legs. Emotional support and coping skill education provided. Cheryl Chandler confirms that her PCS aide continues to provide her with services. However, aide has to be reminded to put on her mask since Cheryl Chandler is high risk. LCSW reviewed resources that [name] provided her with in the past.  Cheryl Chandler reports that she had a wonderful birthday spent with her family this past May. Cheryl Chandler reports receiving appropriate socialization. Cheryl Chandler reports that her spouse took her out to eat and gifted her with several things which was a nice surprise.  Cheryl Chandler confirms stable transportation arrangements for upcoming PCP appointment this week.  Cheryl Chandler repots that she is losing a lot of weight but is not sure how much she has lost as she does not have a scale. Cheryl Chandler reports that a goal of hers is to lose weight but she has not been intentionally losing weight at this time.  Cheryl Chandler reports that her aide [place] continues to assist her with PCS and is an excellent support within the home for her and spouse.  Cheryl Chandler planted a tomato plant and successfully grew over 5 tomatoes  over the summer. Cheryl Chandler is very proud of this achievement as this was her first time planting a tomato plant and is now interested in picking up gardening/planting as a hobby. Cheryl Chandler reports that her aunt continues to be a positive support for her in her life and someone that she can rely on in the case of emergencies.  Cheryl Chandler was educated on healthy self-care skills and advised her to use these into her daily routine.  04/10/21- Update- Cheryl Chandler is agreeable to social work closure at this time as she still does not need medication management or counseling as she is managing her mental health well at this time. Cheryl Chandler talks to her younger sister everyday to gain emotional support. Cheryl Chandler says that her sister is into body building and helps to educate and remind her to do her own self-care.  Cheryl Chandler reports that she has been elevating her legs daily to elevate pain and swelling.  [facility] completed care coordination with Osa Craversabel Chase on 01/15/21 and [facility]W provided the following suggested resource-It is likely their best option given their loss of housing and lack of consistent income:   Research scientist (physical sciences)[employer] Coordinated Assessment                                                                                                                       (  Emergency Crisis Housing) [phone] Ext. 104 M-F, 8am - 4pm Cheryl Chandler was successfully approved for food stamps but is still experiencing financial concerns. She reports that she and her husband have no clothes that fit them and they are having to start using a string for their pants and Cheryl Chandler only has one bra at this time. [facility] LCSW will send referral to [facility] BSW today for financial support.  BSW spoke with Cheryl Chandler about resources needed. Cheryl Chandler states she does receive SSI and was approved for foodstamps. Cheryl Chandler is familiar with all of the food pantries and other resources in [place]. BSW will assist Cheryl Chandler with finding an eye doctor. Cheryl Chandler  states she has not been in 10 years. BSW will research and put a list of eye doctors together that accept Medicaid in [facility]. 04/30/20: BSW contacted and spoke with Cheryl Chandler, she stated she was not doing good because she found out that she has a new landlord and they are going up on her rent. BSW informed Cheryl Chandler to make sure she informs her social workers at [employer] that they have an increase for foodstamps and Medicaid. Cheryl Chandler stated that she sleeps on the couch and is in need of a power recliner/lift chair. BSW will send patients PCP a message asking for a script to be sent. 05/21/21: BSW completed follow up telephone call with Cheryl Chandler. She states she is unsure if their new landlord has received their January rent. Cheryl Chandler states she is also worried due to the new landlord making them pay rent online and on the 1st of the month. Cheryl Chandler states she is unable to get anyone on the phone. Cheryl Chandler states her rent will go up to 400 from 255 in March. Cheryl Chandler states she does not have many clothes but goes to [employer] to get some Quarry manager. No other resources needed at this time.   06/20/21: BSW completed follow up telelphone call with Cheryl Chandler. Cheryl Chandler states she is doing well. Cheryl Chandler states Rolene Arbour will cover all of Vaccines. Cheryl Chandler states no resources are needed at this time. 08/26/21: BSW completed telephone outreach with Cheryl Chandler, she stated since going to the hospital she has been feeling weak. Cheryl Chandler states other than that she is feeling okay. Cheryl Chandler stated she is going to get a new phone on Wednesday. Cheryl Chandler states her birthday is on Friday and her husband may take her to a [place]. No other resources are needed at this time.  BSW completed a telephone outreach with Cheryl Chandler, she states her allergic reaction has spread to her face, she states wants to get the shoulder oxygen, because the regular oxygen is too big to carry around. Car is doing better, they are still able to drive  and get around. Cheryl Chandler states husbands dad has been helping them with there transportation and bills. They landlord has gone up to 600 on lot rent, Cheryl Chandler states they are going to have to sell there home and move.  Update- Cheryl Chandler reports experiencing additional grief lately. [facility] LCSW offered referral for berievement counseling but she declined. UPDATE 08/26/21- Cheryl Chandler reports that she has a near death experience last month. Cheryl Chandler shares that she had a fall. 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 [facility] LCSW completed education on available mental health resources within her area to utilize. Cheryl Chandler admits to experiencing some sadness due to grief from her mother's passing. Emotional support provided. 10/13/21 UPDATE- Cheryl Chandler reports that she is working hard on living a healthier life both physically and mentally. Cheryl Chandler reports  that she is making active changes in her diet. She reports that she eventually wishes to quit smoking again and has started picking up new hobbies to implement during times of craving such as coloring, crocheting and being creative. Cheryl Chandler reports that she is unsure if she needs mental health support at this time because her main concerns are centered around her and her spouse's physical health instead their mental health. [facility] LCSW educated Cheryl Chandler on how they affect one another and their importance. Cheryl Chandler denies any urgent concerns. She reports no issues with affording food due to food stamp assistance. [facility] LCSW will reassess social work needs 30 days from now. UPDATE 11/17/21- Cheryl Chandler reports that she is experiencing several stressors at this time. She was encouraged to consider mental health treatment but she is not interested at this time. Cheryl Chandler was encouraged to consider mental health resources. Cheryl Chandler reports that their car is in the shop and that they have no stable transportation besides their sister and her husband's father. Cheryl Chandler reports that her spouse has had  several recent health issues that have affected her mental state. Cheryl Chandler was educated on healthy coping skills for stress. Cheryl Chandler denies no urgent concerns or issues at this time. UPDATE- Care Coordination completed with [facility] LCSW, PCP office, Pharmacist, [facility] BSW and PCP. Cheryl Chandler has canceled several medical appointments because they are unable to afford a copay. Cheryl Chandler will benefit from financial assistance and may benefit from a [facility] application. Cheryl Chandler has an upcoming appointment with [facility] BSW next week. Cheryl Chandler is agreeable to exploring financial assistance options with [facility] BSW that will help alleviate other areas of financial stress within their life in order for Cheryl Chandler to be able to afford medical expenses. [facility] LCSW encouraged Cheryl Chandler to consider individual counseling. Cheryl Chandler is agreeable to referral for counseling but needs to find out her sister's email first as she is unable to go to office to fill out intake paperwork. Cheryl Chandler prefers virtual counseling to save money on transportation. [facility] LCSW will successfully place referral to [facility] during next outreach. [facility] LCSW completed care coordination with [facility] BSW.  BSW 12/15/21: BSW completed a telephone outreach with Cheryl Chandler. She stated everything is still going okay. She has been having problems with her leg wraps and has an appointment today. She states her husband is still having issues with his eye and with the doctor and she is going to make a compliant. Cheryl Chandler states no resources are needed at this time. [facility] LCSW 01/19/22: Cheryl Chandler reports that she was unable to sleep last night. She is experiencing a break out skin reaction that she feels is caused by stress. Her financial stressors have increased since last session. Cheryl Chandler is agreeable to [facility] LCSW placing referral for [facility] now but reports that she has no email address. [facility] LCSW completed referral and made note of her inability to get into her email. [facility] LCSW will  follow up within two weeks to ensure that she was enrolled in [facility] counseling program. [facility] care coordination call ONLY to [facility] and they were unable to locate Cheryl Chandler's referral that was made on 01/19/22. [facility] LCSW resubmitted this referral today on 01/29/22 for telephonic counseling. Cheryl Chandler was made aware by telephone call that [facility] will contact her by the end of the day tomorrow to schedule a time for her to come into their office to complete in person intake paperwork. Cheryl Chandler is agreeable to this plan. UPDATE 02/03/22- Cheryl Chandler reports that she missed  a call from [facility] and call her back but has not heard back yet. [facility] L[facility] provided Cheryl Chandler with her number and directions on which prompt to enter to be directed to June. Cheryl Chandler will ask June about her phone as Cheryl Chandler is unsure if she can make video calls and complete telephonic therapy which would mean she would need to go for in person sessions. Cheryl Chandler is agreeable to plan. Cheryl Chandler was able to find an assistance program for rent through [facility] on her own. Positive reinforcement provided for this resource find. [facility] collaborated with entire team. [facility]W will follow up in two weeks to ensure that Cheryl Chandler was able to get connected with either telephonic or in person [facility] services [facility]. UPDATE 02/23/22-Cheryl Chandler has decided to decline therapy at this time stating that she "just has too much going on to add in anything else into her schedule." [facility]W will update team on this decision. Reflective and active listening provided during session. [facility] LCSW will follow up in 90 days to ask Cheryl Chandler again about mental health support involvement. Cheryl Chandler is agreeable to this plan. 04/24/22- Cheryl Chandler reports that she is still experiencing a skin reaction from off brand dish soap that she used. She reports that this has been going on for months. Healthy self-care  education provided to Cheryl Chandler to promote healthy hygiene. Cheryl Chandler reports that she is still not interested in counseling right now but confirms that she is stable. She reports that she is now on oxygen which she started back in the fall of 2023. She shares that she has NOT been smoking. Positive reinforcement provided for this. Cheryl Chandler reports that she put up two Christmas trees this year and one was her mother's. Cheryl Chandler reports that her mother's Christmas tree serves as a reminder of the love and special relationship that she had with her mother.  BSW completed a telephone outreach with Cheryl Chandler. She stated she is still itching really bad and thinks she has shingles. She has put up her Christmas decorations and has 2 trees. Cheryl Chandler states she has been stressed about medical bills, but the holidays are keeping her in good spirits. Cheryl Chandler states they are still having financial issues.   Managing Loss, Adult People experience loss in many different ways throughout their lives. Events such as moving, changing jobs, and losing friends can create a sense of loss. The loss may be as serious as a major health change, divorce, death of a pet, or death of a loved one. All of these types of loss are likely to create a physical and emotional reaction known as grief. Grief is the result of a major change or an absence of something or someone that you count on. Grief is a normal reaction to loss. A variety of factors can affect your grieving experience, including: The nature of your loss. Your relationship to what or whom you lost. Your understanding of grief and how to manage it. Your support system. How to manage lifestyle changes Keep to your normal routine as much as possible. If you have trouble focusing or doing normal activities, it is acceptable to take some time away from your normal routine. Spend time with friends and loved ones. Eat a healthy diet, get plenty of sleep, and rest when you feel  tired. How to recognize changes  The way that you deal with your grief will affect your ability to function as you normally do. When grieving, you may experience these  changes: Numbness, shock, sadness, anxiety, anger, denial, and guilt. Thoughts about death. Unexpected crying. A physical sensation of emptiness in your stomach. Problems sleeping and eating. Tiredness (fatigue). Loss of interest in normal activities. Dreaming about or imagining seeing the person who died. A need to remember what or whom you lost. Difficulty thinking about anything other than your loss for a period of time. Relief. If you have been expecting the loss for a while, you may feel a sense of relief when it happens. Follow these instructions at home:    Activity Express your feelings in healthy ways, such as: Talking with others about your loss. It may be helpful to find others who have had a similar loss, such as a support group. Writing down your feelings in a journal. Doing physical activities to release stress and emotional energy. Doing creative activities like painting, sculpting, or playing or listening to music. Practicing resilience. This is the ability to recover and adjust after facing challenges. Reading some resources that encourage resilience may help you to learn ways to practice those behaviors. General instructions Be Cheryl Chandler with yourself and others. Allow the grieving process to happen, and remember that grieving takes time. It is likely that you may never feel completely done with some grief. You may find a way to move on while still cherishing memories and feelings about your loss. Accepting your loss is a process. It can take months or longer to adjust. Keep all follow-up visits as told by your health care provider. This is important. Where to find support To get support for managing loss: Ask your health care provider for help and recommendations, such as grief counseling or  therapy. Think about joining a support group for people who are managing a loss. Where to find more information You can find more information about managing loss from: American Society of Clinical Oncology: www.cancer.net American Psychological Association: TVStereos.ch Contact a health care provider if: Your grief is extreme and keeps getting worse. You have ongoing grief that does not improve. Your body shows symptoms of grief, such as illness. You feel depressed, anxious, or lonely. Get help right away if: You have thoughts about hurting yourself or others. If you ever feel like you may hurt yourself or others, or have thoughts about taking your own life, get help right away. You can go to your nearest emergency department or call: Your local emergency services (911 in the [place] A suicide crisis helpline, such as the [place] at 610-305-3539. This is open 24 hours a day. Summary Grief is the result of a major change or an absence of someone or something that you count on. Grief is a normal reaction to loss. The depth of grief and the period of recovery depend on the type of loss and your ability to adjust to the change and process your feelings. Processing grief requires patience and a willingness to accept your feelings and talk about your loss with people who are supportive. It is important to find resources that work for you and to realize that people experience grief differently. There is not one grieving process that works for everyone in the same way. Be aware that when grief becomes extreme, it can lead to more severe issues like isolation, depression, anxiety, or suicidal thoughts. Talk with your health care provider if you have any of these issues. This information is not intended to replace advice given to you by your health care provider. Make sure you discuss any questions you  have with your health care provider. Document Revised: 06/17/2018  Document Reviewed: 08/27/2016 Elsevier Cheryl Chandler Education  2020 [employer].  Help for Managing Grief   When you are experiencing grief, many resources and support are available to help you. You are not alone.    Grief Share (https://www.SunglassSpecialist.gl):    [employer] of [address] [place], [place]      [employer]  [phone] [address] [place], [place]     [employer] [address] [place], [place]      [facility] [address] [place], [place]  [phone]   [facility] [address] [address] [place], [place] [phone]   [facility] [address] [place], [place]     [phone]   [facility] [address] [place], [place]     [phone]   If a Hospice or Palliative Care team has been involved in the care of your loved one, you may reach out to your contact there or locally, you may reach out to [facility]:    [facility]   [address] [place], [place] [address] [phone] Cheryl Chandler was educated on healthy self-care as she admits she has little motivation to walk lately. PCS recommendation was suggested.   Cheryl Chandler Self Care Activities:  Attends all scheduled provider appointments Calls provider office for new concerns or questions  Please see past updates related to this goal by clicking on the "Past Updates" button in the selected goal

## 2022-05-04 NOTE — Progress Notes (Signed)
Please let patient know that her lab work looks good.  A1c and cholesterol are well controlled.  Make sure to continue with the vitamin D supplement.  No concerns at this time.  Continue with current medication regimen.  Follow up as discussed.

## 2022-05-04 NOTE — Patient Outreach (Signed)
Medicaid Managed Care Social Work Note  05/04/2022 Name:  Cheryl Chandler MRN:  [id number] DOB:  1965-11-17  Cheryl Chandler is an 57 y.o. year old female who is a primary patient of [name], NP.  The Medicaid Managed Care Coordination team was consulted for assistance with:  Community Resources   Ms. [name] was given information about Medicaid Managed Care Coordination team services today. Cheryl Chandler Patient agreed to services and verbal consent obtained.  Engaged with patient  for by telephone forfollow up visit in response to referral for case management and/or care coordination services.   Assessments/Interventions:  Review of past medical history, allergies, medications, health status, including review of consultants reports, laboratory and other test data, was performed as part of comprehensive evaluation and provision of chronic care management services.  SDOH: (Social Determinant of Health) assessments and interventions performed: [name]    Flowsheet Row Office Visit from 05/01/2022 in [facility] Patient Outreach Telephone from 04/24/2022 in [place] POPULATION HEALTH DEPARTMENT Patient Outreach Telephone from 04/23/2022 in [place] POPULATION HEALTH DEPARTMENT Patient Outreach Telephone from 03/18/2022 in [place] POPULATION HEALTH DEPARTMENT Office Visit from 03/17/2022 in [facility] Patient Outreach from 03/16/2022 in  POPULATION HEALTH DEPARTMENT  [name]        Food Insecurity Interventions -- -- Other (Comment)  [has food stamps and resources] -- -- --  Housing Interventions -- -- -- Intervention Not Indicated -- --  Transportation Interventions -- -- Patient Resources Dietitian) -- -- --  Depression Interventions/Treatment  Medication, Currently on Treatment -- -- -- Medication, Currently on Treatment --  Financial Strain Interventions -- -- -- -- -- Other (Comment)   [working with BSW]  Stress Interventions -- [employer], Provide Counseling -- -- -- --  Social Connections Interventions -- -- -- Intervention Not Indicated -- --     BSW completed a telephone outreach with patient, she states her allergic reaction has spread to her face, she states wants to get the shoulder oxygen, because the regular oxygen is too big to carry around. Car is doing better, they are still able to drive and get around. Patient states husbands dad has been helping them with there transportation and bills. They landlord has gone up to 600 on lot rent, patient states they are going to have to sell there home and move  Advanced Directives Status:  Not addressed in this encounter.  Care Plan                 Allergies  Allergen Reactions   Codeine Shortness Of Breath and Rash   Percocet [Oxycodone-Acetaminophen] Shortness Of Breath   Sulfa Antibiotics Shortness Of Breath and Rash   Aspirin Hives   Bee Venom     Bee stings   Darvon [Propoxyphene]     Darvocet-N 100   Latex    Oxycodone Nausea And Vomiting   Penicillins     Has patient had a PCN reaction causing immediate rash, facial/tongue/throat swelling, SOB or lightheadedness with hypotension: Unknown Has patient had a PCN reaction causing severe rash involving mucus membranes or skin necrosis: Unknown Has patient had a PCN reaction that required hospitalization: Unknown Has patient had a PCN reaction occurring within the last 10 years: Unknown If all of the above answers are "NO", then may proceed with Cephalosporin use.    Medications Reviewed Today     Reviewed by [name], NP (Nurse Practitioner) on 05/01/22 at 1005  Med  List Status: <None>   Medication Order Taking? Sig Documenting Provider Last Dose Status Informant  ACCU-CHEK GUIDE test strip [id number] Yes USE TO CHECK BLOOD SUGAR 3 TIMES DAILY AS DIRECTED [name], NP Taking Active   Accu-Chek Softclix Lancets  lancets [id number] Yes CHECK BLOOD SUGAR TWICE DAILY [name], NP Taking Active   albuterol (PROVENTIL) (2.5 MG/3ML) 0.083% nebulizer solution [id number] Yes USE 1 VIAL VIA NEBULIZER EVERY 4 HOURS AS NEEDED [name], NP Taking Active   amLODipine (NORVASC) 5 MG tablet [id number] Yes TAKE 1 TABLET BY MOUTH ONCE DAILY [name], NP Taking Active   atorvastatin (LIPITOR) 80 MG tablet [id number] Yes TAKE 1 TABLET BY MOUTH ONCE EVERY Charolotte Eke, NP Taking Active   Blood Glucose Monitoring Suppl (ACCU-CHEK GUIDE) w/Device KIT [id number] Yes Use to check blood sugar 3 times a day. Aura Dials T, NP Taking Active   Blood Pressure Monitoring (OMRON 3 SERIES BP MONITOR) DEVI [id number] Yes Use to check blood pressure as directed [name], NP Taking Active   citalopram (CELEXA) 40 MG tablet [id number] Yes TAKE 1 TABLET BY MOUTH ONCE DAILY [name], NP Taking Active   EPINEPHrine 0.3 mg/0.3 mL IJ SOAJ injection [id number] Yes INJECT 1 SYRINGE INTO OUTER THIGH ONCE AS NEEDED FOR SEVERE ALLERGIC REACTION. [name], NP Taking Active   Fluticasone-Umeclidin-Vilant (TRELEGY ELLIPTA) 100-62.5-25 MCG/ACT AEPB [id number] Yes Inhale 1 puff into the lungs daily. Salena Saner, MD Taking Active   Fluticasone-Umeclidin-Vilant [facility] ELLIPTA) 100-62.5-25 MCG/ACT AEPB [id number] Yes Inhale 1 puff into the lungs daily. Salena Saner, MD Taking Active   furosemide (LASIX) 20 MG tablet [id number] Yes TAKE 1 TABLET BY MOUTH ONCE DAILY IN AFTERNOON AS NEEDED LEG EDEMA [name], NP Taking Active   gabapentin (NEURONTIN) 300 MG capsule [id number] Yes TAKE 1 CAPSULE BY MOUTH 3 TIMES DAILY ASNEEDED (NEED OFFICE VISIT FOR FURTHER REFILLS) [name], NP Taking Active   hydrALAZINE (APRESOLINE) 25 MG tablet [id number] Yes TAKE 1 TABLET BY MOUTH 3 TIMES DAILY [name], NP Taking Active   levothyroxine (SYNTHROID) 50 MCG tablet [id number] Yes TAKE  1 TABLET BY MOUTH ONCE DAILY ON AN EMPTY STOMACH. WAIT 30 MINUTES BEFORE TAKING OTHER MEDS. [name], NP Taking Active   lisinopril (ZESTRIL) 40 MG tablet [id number] Yes TAKE 1 TABLET BY MOUTH ONCE DAILY [name], NP Taking Active   meloxicam (MOBIC) 15 MG tablet [id number] Yes TAKE 1 TABLET BY MOUTH ONCE DAILY AS NEEDED WITH FOOD OR MILK [name], NP Taking Active   montelukast (SINGULAIR) 10 MG tablet [id number] Yes TAKE 1 TABLET BY MOUTH AT BEDTIME [name], NP Taking Active   nitroGLYCERIN (NITROSTAT) 0.4 MG SL tablet [id number] Yes DISSOLVE 1 TABLET UNDER TONGUE AT ONSET OF CHEST PAIN. REPEAT IN 5 MIN IF NOT RESOLVED, MAX 3 DOSES. 911 IF NEEDED. [name], NP Taking Active   omeprazole (PRILOSEC) 20 MG capsule [id number] Yes TAKE 1 CAPSULE BY MOUTH ONCE DAILY [name], NP Taking Active   OXYGEN [id number] Yes Inhale 3 L into the lungs daily. [provider] Taking Active   spironolactone (ALDACTONE) 25 MG tablet [id number] Yes Take 0.5 tablets (12.5 mg total) by mouth daily. [name], NP Taking Active   TRULICITY 0.75 MG/0.5ML Namon Cirri [id number] Yes INJECT 0.75MG  SUBQ ONCE A WEEK [name], NP Taking Active   Vitamin D, Ergocalciferol, (DRISDOL) 1.25 MG (50000 UNIT) CAPS capsule [id number] Yes TAKE 1 CAPSULE BY MOUTH EVERY 7 DAYS [name], NP Taking Active  Patient Active Problem List   Diagnosis Date Noted   Venous ulcer (HCC) 05/03/2022   Foot pain, right 09/01/2021   Fibrocystic disease of both breasts 05/07/2019   IFG (impaired fasting glucose) 05/03/2019   Chronic venous insufficiency 11/09/2018   Overactive bladder 09/30/2018   Peripheral neuropathy 09/30/2018   Apnea 01/02/2016   Lymphedema 11/30/2014   Dyspnea on exertion 11/30/2014   Anxiety 11/29/2014   Arthritis 11/29/2014   COPD (chronic obstructive pulmonary disease) (HCC) 11/29/2014   Clinical depression 11/29/2014   H/O eating  disorder 11/29/2014   Esophageal reflux 11/29/2014   Acne inversa 11/29/2014   Essential hypertension 11/29/2014   HLD (hyperlipidemia) 11/29/2014   Adult hypothyroidism 11/29/2014   Insomnia 11/29/2014   Low back pain 11/29/2014   Morbid obesity (HCC) 11/29/2014   Posttraumatic stress disorder 11/29/2014   Vitamin D deficiency 11/29/2014   Nicotine dependence, cigarettes, uncomplicated 11/29/2014    Conditions to be addressed/monitored per PCP order:   community resources  Care Plan : General Social Work (Adult)  Updates made by Shaune LeeksShields, Sheccid Lahmann J since 05/04/2022 12:00 AM     Problem: Coping Skills (General Plan of Care)      Long-Range Goal: Coping Skills Enhanced   Start Date: 11/07/2020  Expected End Date: 03/07/2022  Recent Progress: On track  Priority: High  Note:   Timeframe:  Long-Range Goal Priority:  High Start Date:       08/26/21                    Expected End Date:  ongoing   Current Barriers:  Financial constraints Mental Health Concerns  Social Isolation Limited education about mental health support resources that are available to her within the local area* Cognitive Deficits Lacks knowledge of community resource   Clinical Social Work Clinical Goal(s):  Over the next 90 days, client will work with SW to address concerns related to experiencing ongoing symptoms of depression, sadness and grief since the passing of her mother Over the next 90 days, patient will work with LCSW to address needs related to implementing appropriate self-care and depression management.    Interventions: Patient interviewed and appropriate assessments performed Provided mental health counseling with regards to patient's loss and ongoing stressors. Patient was very close with her mother who passed away a few years ago and she continues to experience symptoms of grief from this loss. Patient denies wanting LCSW to make a referral for grief counseling through [facility] again but was  receptive to coping skill and resource education/information that was provided during session today. Patient reports decorating for the holidays helps her to connect with her mother as they did this together when she was alive.  Provided patient with information about managing depression within her daily life. Patient has ongoing grief symptoms as well. Patient states "I feel worn down." Patient reports that she talks out loud to her mother's picture and this is very helpful to her. Grief support resource education provided again during session today.  Provided reflective listening and implemented appropriate interventions to help support patient and her emotional needs  Discussed plans with patient for ongoing care management follow up and provided patient with direct contact information for [facility] team Ad[facility] to [facility] with any urgent case management needs. Assisted patient/caregiver with obtaining information about health plan benefits LCSW completed past referral for family to have a wheelchair ramp built at their residence due to ongoing mobility concerns. Ramp has been successfully installed.  PCS actively involved with patient and providing ongoing services.  Positive reinforcement provided to patient for quitting smoking cigarettes. Coping skill review education provided for future triggers.  Brief self-care education provided to both patient and spouse. Patient confirms that she continues to go to food pantry for food insecurity. Resource information has been provided.  Patient reports ongoing pain and issues with mobility. Patient shares that her pain resides in her pains and legs. Emotional support and coping skill education provided. Patient confirms that her PCS aide continues to provide her with services. However, aide has to be reminded to put on her mask since patient is high risk. LCSW reviewed resources that [name] provided her with in the past.  Patient reports that  she had a wonderful birthday spent with her family this past May. Patient reports receiving appropriate socialization. Patient reports that her spouse took her out to eat and gifted her with several things which was a nice surprise.  Patient confirms stable transportation arrangements for upcoming PCP appointment this week.  Patient repots that she is losing a lot of weight but is not sure how much she has lost as she does not have a scale. Patient reports that a goal of hers is to lose weight but she has not been intentionally losing weight at this time.  Patient reports that her aide [place] continues to assist her with PCS and is an excellent support within the home for her and spouse.  Patient planted a tomato plant and successfully grew over 5 tomatoes over the summer. Patient is very proud of this achievement as this was her first time planting a tomato plant and is now interested in picking up gardening/planting as a hobby. Patient reports that her aunt continues to be a positive support for her in her life and someone that she can rely on in the case of emergencies.  Patient was educated on healthy self-care skills and advised her to use these into her daily routine.  04/10/21- Update- Patient is agreeable to social work closure at this time as she still does not need medication management or counseling as she is managing her mental health well at this time. Patient talks to her younger sister everyday to gain emotional support. Patient says that her sister is into body building and helps to educate and remind her to do her own self-care.  Patient reports that she has been elevating her legs daily to elevate pain and swelling.  [facility] LCSW completed care coordination with Osa Craver on 01/15/21 and [facility] LCSW provided the following suggested resource-It is likely their best option given their loss of housing and lack of consistent income:   Research scientist (physical sciences)                                                                                                                        (Emergency Crisis Housing) [phone] Ext. 104 M-F, 8am - 4pm Patient was successfully approved for food stamps but is  still experiencing financial concerns. She reports that she and her husband have no clothes that fit them and they are having to start using a string for their pants and patient only has one bra at this time. [facility] LCSW will send referral to [facility] BSW today for financial support.  BSW spoke with patient about resources needed. Patient states she does receive SSI and was approved for foodstamps. Patient is familiar with all of the food pantries and other resources in [place]. BSW will assist patient with finding an eye doctor. Patient states she has not been in 10 years. BSW will research and put a list of eye doctors together that accept Medicaid in [facility]. 04/30/20: BSW contacted and spoke with patient, she stated she was not doing good because she found out that she has a new landlord and they are going up on her rent. BSW informed patient to make sure she informs her social workers at [employer] that they have an increase for foodstamps and Medicaid. Patient stated that she sleeps on the couch and is in need of a power recliner/lift chair. BSW will send patients PCP a message asking for a script to be sent. 05/21/21: BSW completed follow up telephone call with patient. She states she is unsure if their new landlord has received their January rent. Patient states she is also worried due to the new landlord making them pay rent online and on the 1st of the month. Patient states she is unable to get anyone on the phone. Patient states her rent will go up to 400 from 255 in March. 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 No other resources needed at this time.   06/20/21: BSW completed follow up telelphone call with patient.  Patient states she is doing well. Patient states Rolene Arbour will cover all of Vaccines. Patient states no resources are needed at this time. 08/26/21: BSW completed telephone outreach with patient, she stated since going to the hospital she has been feeling weak. Patient states other than that she is feeling okay. Patient stated she is going to get a new phone on Wednesday. Patient states her birthday is on Friday and her husband may take her to a [place]. No other resources are needed at this time.  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 Car is doing better, they are still able to drive and get around. Patient states husbands dad has been helping them with there transportation and bills. They landlord has gone up to 600 on lot rent, patient states they are going to have to sell there home and move.  Update- Patient reports experiencing additional grief lately. [facility] LCSW offered referral for berievement counseling but she declined. UPDATE 08/26/21- Patient reports that she has a near death experience last month. Patient shares that she had a fall. 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 [facility] LCSW completed education on available mental health resources within her area to utilize. Patient admits to experiencing some sadness due to grief from her mother's passing. Emotional support provided. 10/13/21 UPDATE- Patient reports that she is working hard on living a healthier life both physically and mentally. Patient reports that she is making active changes in her diet. She reports that she eventually wishes to quit smoking again and  has started picking up new hobbies to implement during times of craving such as coloring, crocheting and being creative. Patient reports that she is unsure if she needs mental health support at this time because  her main concerns are centered around her and her spouse's physical health instead their mental health. [facility] LCSW educated patient on how they affect one another and their importance. Patient denies any urgent concerns. She reports no issues with affording food due to food stamp assistance. [facility] LCSW will reassess social work needs 30 days from now. UPDATE 11/17/21- Patient reports that she is experiencing several stressors at this time. She was encouraged to consider mental health treatment but she is not interested at this time. Patient was encouraged to consider mental health resources. Patient reports that their car is in the shop and that they have no stable transportation besides their sister and her husband's father. Patient reports that her spouse has had several recent health issues that have affected her mental state. Patient was educated on healthy coping skills for stress. Patient denies no urgent concerns or issues at this time. UPDATE- Care Coordination completed with [facility] LCSW, PCP office, Pharmacist, [facility] BSW and PCP. Patient has canceled several medical appointments because they are unable to afford a copay. Patient will benefit from financial assistance and may benefit from a [facility] application. Patient has an upcoming appointment with [facility] BSW next week. Patient is agreeable to exploring financial assistance options with [facility] BSW that will help alleviate other areas of financial stress within their life in order for patient to be able to afford medical expenses. [facility] LCSW encouraged patient to consider individual counseling. Patient is agreeable to referral for counseling but needs to find out her sister's email first as she is unable to go to office to fill out intake paperwork. Patient prefers virtual counseling to save money on transportation. [facility] LCSW will successfully place referral to [facility] during next outreach. [facility] LCSW completed care coordination with [facility] BSW.  BSW 12/15/21: BSW  completed a telephone outreach with patient. She stated everything is still going okay. She has been having problems with her leg wraps and has an appointment today. She states her husband is still having issues with his eye and with the doctor and she is going to make a compliant. Patient states no resources are needed at this time. [facility] LCSW 01/19/22: Patient reports that she was unable to sleep last night. She is experiencing a break out skin reaction that she feels is caused by stress. Her financial stressors have increased since last session. Patient is agreeable to [facility] LCSW placing referral for [facility] now but reports that she has no email address. [facility] LCSW completed referral and made note of her inability to get into her email. [facility] LCSW will follow up within two weeks to ensure that she was enrolled in [facility] counseling program. [facility] completed care coordination call ONLY to [facility] and they were unable to locate patient's referral that was made on 01/19/22. [facility] LCSW resubmitted this referral today on 01/29/22 for telephonic counseling. Patient was made aware by telephone call that [facility] will contact her by the end of the day tomorrow to schedule a time for her to come into their office to complete in person intake paperwork. Patient is agreeable to this plan. UPDATE 02/03/22- Patient reports that she missed a call from June the receptionist at [facility] and call her back but has not heard back yet. [place]  LCSW provided patient with her number and directions on which prompt to enter to be directed to June. Patient will ask June about her phone as patient is unsure if she can make video calls and complete telephonic therapy which would mean she would need to go for in person sessions. Patient is agreeable to plan. Patient was able to find an assistance program for rent through [facility] on her own. Positive reinforcement provided for this resource find. [facility]  LCSW collaborated with entire team. [facility] LCSW will follow up in two weeks to ensure that patient was able to get connected with either telephonic or in person therapy services at [facility]. UPDATE 02/23/22-Patient has decided to decline therapy at this time stating that she "just has too much going on to add in anything else into her schedule." [facility] LCSW will update team on this decision. Reflective and active listening provided during session. [facility] LCSW will follow up in 90 days to ask patient again about mental health support involvement. Patient is agreeable to this plan. 04/24/22- Patient reports that she is still experiencing a skin reaction from off brand dish soap that she used. She reports that this has been going on for months. Healthy self-care education provided to patient to promote healthy hygiene. Patient reports that she is still not interested in counseling right now but confirms that she is stable. She reports that she is now on oxygen which she started back in the fall of 2023. She shares that she has NOT been smoking. Positive reinforcement provided for this. Patient reports that she put up two Christmas trees this year and one was her mother's. Patient reports that her mother's Christmas tree serves as a reminder of the love and special relationship that she had with her mother.  BSW completed a telephone outreach with patient. She stated she is still itching really bad and thinks she has shingles. She has put up her Christmas decorations and has 2 trees. Patient states she has been stressed about medical bills, but the holidays are keeping her in good spirits. Patient states they are still having financial issues.   Managing Loss, Adult People experience loss in many different ways throughout their lives. Events such as moving, changing jobs, and losing friends can create a sense of loss. The loss may be as serious as a major health change, divorce, death of a pet, or death of a loved  one. All of these types of loss are likely to create a physical and emotional reaction known as grief. Grief is the result of a major change or an absence of something or someone that you count on. Grief is a normal reaction to loss. A variety of factors can affect your grieving experience, including: The nature of your loss. Your relationship to what or whom you lost. Your understanding of grief and how to manage it. Your support system. How to manage lifestyle changes Keep to your normal routine as much as possible. If you have trouble focusing or doing normal activities, it is acceptable to take some time away from your normal routine. Spend time with friends and loved ones. Eat a healthy diet, get plenty of sleep, and rest when you feel tired. How to recognize changes  The way that you deal with your grief will affect your ability to function as you normally do. When grieving, you may experience these changes: Numbness, shock, sadness, anxiety, anger, denial, and guilt. Thoughts about death. Unexpected crying. A physical sensation of emptiness in  your stomach. Problems sleeping and eating. Tiredness (fatigue). Loss of interest in normal activities. Dreaming about or imagining seeing the person who died. A need to remember what or whom you lost. Difficulty thinking about anything other than your loss for a period of time. Relief. If you have been expecting the loss for a while, you may feel a sense of relief when it happens. Follow these instructions at home:    Activity Express your feelings in healthy ways, such as: Talking with others about your loss. It may be helpful to find others who have had a similar loss, such as a support group. Writing down your feelings in a journal. Doing physical activities to release stress and emotional energy. Doing creative activities like painting, sculpting, or playing or listening to music. Practicing resilience. This is the ability to  recover and adjust after facing challenges. Reading some resources that encourage resilience may help you to learn ways to practice those behaviors. General instructions Be patient with yourself and others. Allow the grieving process to happen, and remember that grieving takes time. It is likely that you may never feel completely done with some grief. You may find a way to move on while still cherishing memories and feelings about your loss. Accepting your loss is a process. It can take months or longer to adjust. Keep all follow-up visits as told by your health care provider. This is important. Where to find support To get support for managing loss: Ask your health care provider for help and recommendations, such as grief counseling or therapy. Think about joining a support group for people who are managing a loss. Where to find more information You can find more information about managing loss from: American Society of Clinical Oncology: www.cancer.net American Psychological Association: DiceTournament.cawww.apa.org Contact a health care provider if: Your grief is extreme and keeps getting worse. You have ongoing grief that does not improve. Your body shows symptoms of grief, such as illness. You feel depressed, anxious, or lonely. Get help right away if: You have thoughts about hurting yourself or others. If you ever feel like you may hurt yourself or others, or have thoughts about taking your own life, get help right away. You can go to your nearest emergency department or call: Your local emergency services (911 in the [place] A suicide crisis helpline, such as the National Suicide Prevention Lifeline at (424)191-52151-269-554-2027. This is open 24 hours a day. Summary Grief is the result of a major change or an absence of someone or something that you count on. Grief is a normal reaction to loss. The depth of grief and the period of recovery depend on the type of loss and your ability to adjust to the change and  process your feelings. Processing grief requires patience and a willingness to accept your feelings and talk about your loss with people who are supportive. It is important to find resources that work for you and to realize that people experience grief differently. There is not one grieving process that works for everyone in the same way. Be aware that when grief becomes extreme, it can lead to more severe issues like isolation, depression, anxiety, or suicidal thoughts. Talk with your health care provider if you have any of these issues. This information is not intended to replace advice given to you by your health care provider. Make sure you discuss any questions you have with your health care provider. Document Revised: 06/17/2018 Document Reviewed: 08/27/2016 Elsevier Patient Education  2020 [employer]  Help for Managing Grief   When you are experiencing grief, many resources and support are available to help you. You are not alone.    Grief Share (https://www.SunglassSpecialist.gl):    [employer] of [address] [place], [place]      [employer]  [phone] [address] [place], [place]     [facility] [address] [place], [place]      [facility] [address] [place], [place]  [phone]   [facility] [address] [place], [place] [phone]   [facility] [place] [address] [place], [place]     [phone]   [facility] [address] [place], [place]     [phone]   If a Hospice or Palliative Care team has been involved in the care of your loved one, you may reach out to your contact there or locally, you may reach out to [facility]:    [facility]   [address] [place], [place] [address] [phone] Patient was educated on healthy self-care as she admits she has little motivation to walk lately.  PCS recommendation was suggested.   Patient Self Care Activities:  Attends all scheduled provider appointments Calls provider office for new concerns or questions  Please see past updates related to this goal by clicking on the "Past Updates" button in the selected goal        Follow up:  Patient agrees to Care Plan and Follow-up.  Plan: The Managed Medicaid care management team will reach out to the patient again over the next 30 days.  Date/time of next scheduled Social Work care management/care coordination outreach:  06/01/22  [name], Kenard Gower, [facility]  [facility]  High Risk Managed Medicaid Team  [phone]

## 2022-05-05 ENCOUNTER — Ambulatory Visit: Payer: Medicaid Other

## 2022-05-06 ENCOUNTER — Other Ambulatory Visit: Payer: Medicaid Other

## 2022-05-06 ENCOUNTER — Other Ambulatory Visit: Payer: Self-pay | Admitting: Nurse Practitioner

## 2022-05-06 ENCOUNTER — Ambulatory Visit: Payer: Self-pay | Admitting: *Deleted

## 2022-05-06 MED ORDER — HYDROXYZINE PAMOATE 25 MG PO CAPS: 25.0000 mg | ORAL_CAPSULE | Freq: Three times a day (TID) | ORAL | 0 refills | Status: DC | PRN

## 2022-05-06 NOTE — Telephone Encounter (Signed)
Attempted to return her call.   Left a voicemail to call back. 

## 2022-05-06 NOTE — Telephone Encounter (Signed)
Message from Roslynn Amble sent at 05/06/2022  2:16 PM EST  Summary: Rash/itching   The patient called in stating she was seen recently for a bad rash. She was given prednisone and hydralazine both which helped some but she is out of the prednisone and the rash is back and she is itching all over. The patient was referred to a dermatologist who told her they don't have any appointments until November in which she is scheduled. She is on a wait list. Please assist patient further          Call History   Type Contact Phone/Fax User  05/06/2022 02:11 PM EST Phone (Incoming) Cheryl, Chandler (Self) [phone] Lemmie Evens) Romeo Rabon P

## 2022-05-06 NOTE — Telephone Encounter (Signed)
I do not want to refill the prednisone at this time.  Please remind patient that she needs to see Dermatology.  I sent the refill of Hydroxyzine to the pharmacy.

## 2022-05-06 NOTE — Telephone Encounter (Signed)
Called and LVM asking for patient to please return my call.   OK for [facility] to speak to patient and advise her of Karen's message.  

## 2022-05-06 NOTE — Telephone Encounter (Signed)
     Chief Complaint: Finished Prednisone and rash is back. Asking for refills on Prednisone and hydralazine. Please advise pt. Symptoms: Itchy Frequency: 5 months Pertinent Negatives: Patient denies  Disposition: [] ED /[] Urgent Care (no appt availability in office) / [] Appointment(In office/virtual)/ []  [facility]/ [] Home Care/ [] Refused Recommended Disposition /[] [place] [facility]/ [x]  Follow-up with PCP Additional Notes: Please advise.  Answer Assessment - Initial Assessment Questions 1. APPEARANCE of RASH: "Describe the rash." (e.g., spots, blisters, raised areas, skin peeling, scaly)     Red, bumps 2. SIZE: "How big are the spots?" (e.g., tip of pen, eraser, coin; inches, centimeters)     Small 3. LOCATION: "Where is the rash located?"     All over 4. COLOR: "What color is the rash?" (Note: It is difficult to assess rash color in people with darker-colored skin. When this situation occurs, simply ask the caller to describe what they see.)     Red 5. ONSET: "When did the rash begin?"     A long time 6. FEVER: "Do you have a fever?" If Yes, ask: "What is your temperature, how was it measured, and when did it start?"     No 7. ITCHING: "Does the rash itch?" If Yes, ask: "How bad is the itch?" (Scale 1-10; or mild, moderate, severe)     Moderate 8. CAUSE: "What do you think is causing the rash?"     Soap 9. MEDICINE FACTORS: "Have you started any new medicines within the last 2 weeks?" (e.g., antibiotics)      No 10. OTHER SYMPTOMS: "Do you have any other symptoms?" (e.g., dizziness, headache, sore throat, joint pain)       No 11. PREGNANCY: "Is there any chance you are pregnant?" "When was your last menstrual period?"       No  Protocols used: Rash or Redness - [facility]

## 2022-05-07 NOTE — Telephone Encounter (Signed)
Requested medication (s) are due for refill today: Yes  Requested medication (s) are on the active medication list: Yes  Last refill:  10/03/21  Future visit scheduled: No  Notes to clinic:  See request.    Requested Prescriptions  Pending Prescriptions Disp Refills   Vitamin D, Ergocalciferol, (DRISDOL) 1.25 MG (50000 UNIT) CAPS capsule [Pharmacy Med Name: VITAMIN D (ERGOCALCIFEROL) 1.25 MG] 12 capsule 1    Sig: TAKE 1 CAPSULE BY MOUTH EVERY 7 DAYS     Endocrinology:  Vitamins - Vitamin D Supplementation 2 Failed - 05/06/2022  5:41 PM      Failed - Manual Review: Route requests for 50,000 IU strength to the provider      Failed - Vitamin D in normal range and within 360 days    Vit D, 25-Hydroxy  Date Value Ref Range Status  05/01/2022 26.3 (L) 30.0 - 100.0 ng/mL Final    Comment:    Vitamin D deficiency has been defined by the [place] practice guideline as a level of serum 25-OH vitamin D less than 20 ng/mL (1,2). The Endocrine Society went on to further define vitamin D insufficiency as a level between 21 and 29 ng/mL (2). 1. IOM (Institute of Medicine). 2010. Dietary reference    intakes for calcium and D. [place]: The    [employer]. 2. Holick MF, Binkley Iota, Bischoff-Ferrari HA, et al.    Evaluation, treatment, and prevention of vitamin D    deficiency: an Endocrine Society clinical practice    guideline. JCEM. 2011 Jul; 96(7):1911-30.          Passed - Ca in normal range and within 360 days    Calcium  Date Value Ref Range Status  05/01/2022 9.0 8.7 - 10.2 mg/dL Final   Calcium, Total  Date Value Ref Range Status  08/05/2014 8.2 (L) mg/dL Final    Comment:    8.9-10.3 NOTE: New Reference Range  07/03/14          Passed - Valid encounter within last 12 months    Recent Outpatient Visits           6 days ago Morbid obesity ([place])   [place], NP   1 month ago Rash  and nonspecific skin eruption   [facility] Mecum, Dani Gobble, PA-C   4 months ago Appointment canceled by hospital   [place], NP   4 months ago Essential hypertension   [facility] [name], NP   6 months ago Essential hypertension   [facility] [name], NP       Future Appointments             In 2 weeks Gollan, Kathlene November, MD [place]. [place]   In 2 months [name], NP [facility], [place]   In 10 months Brendolyn Patty, MD [place]            Signed Prescriptions Disp Refills   TRULICITY 5.70 VX/7.9TJ SOPN 6 mL 1    Sig: INJECT 0.75MG  SUBQ ONCE A WEEK     Endocrinology:  Diabetes - GLP-1 Receptor Agonists Passed - 05/06/2022  5:41 PM      Passed - HBA1C is between 0 and 7.9 and within 180 days    HB A1C (BAYER DCA - WAIVED)  Date Value Ref Range Status  04/03/2020 5.5 <7.0 % Final    Comment:                                          Diabetic Adult            <7.0                                       Healthy Adult        4.3 - 5.7                                                           (DCCT/NGSP) American Diabetes Association's Summary of Glycemic Recommendations for Adults with Diabetes: Hemoglobin A1c <7.0%. More stringent glycemic goals (A1c <6.0%) may further reduce complications at the cost of increased risk of hypoglycemia.    Hgb A1c MFr Bld  Date Value Ref Range Status  05/01/2022 6.1 (H) 4.8 - 5.6 % Final    Comment:             Prediabetes: 5.7 - 6.4          Diabetes: >6.4          Glycemic control for adults with diabetes: <7.0          Passed - Valid encounter within last 6 months    Recent Outpatient Visits           6 days ago Morbid obesity ([place])   [facility] [name], NP   1 month ago Rash and nonspecific skin eruption   [facility]  Mecum, Dani Gobble, PA-C   4 months ago Appointment canceled by hospital   [place], NP   4 months ago Essential hypertension   [facility] [name], NP   6 months ago Essential hypertension   [facility] [name], NP       Future Appointments             In 2 weeks Gollan, Kathlene November, MD [place]. [place]   In 2 months [name], NP [facility], [place]   In 10 months Brendolyn Patty, MD [place]             amLODipine (NORVASC) 5 MG tablet 90 tablet 1    Sig: TAKE 1 TABLET BY MOUTH ONCE DAILY     Cardiovascular: Calcium Channel Blockers 2 Failed - 05/06/2022  5:41 PM      Failed - Last BP in normal range    BP Readings from Last 1 Encounters:  05/04/22 (!) 188/65         Passed - Last Heart Rate in normal range    Pulse Readings from Last 1 Encounters:  05/04/22 70         Passed - Valid encounter within last 6 months    Recent Outpatient Visits           6 days ago Morbid obesity ([place])   [place], NP   1 month ago  Rash and nonspecific skin eruption   [facility] Mecum, Oswaldo Conroy, PA-C   4 months ago Appointment canceled by hospital   [facility] [name], NP   4 months ago Essential hypertension   [facility] [name], NP   6 months ago Essential hypertension   [name], NP       Future Appointments             In 2 weeks Gollan, Tollie Pizza, MD [facility] [place]. [facility]   In 2 months [name], NP [facility], [facility]   In 10 months Willeen Niece, MD [facility]             ACCU-CHEK GUIDE test strip 300 each 2    Sig: USE TO CHECK BLOOD SUGAR 3 TIMES DAILY AS DIRECTED     Endocrinology: Diabetes - Testing Supplies  Passed - 05/06/2022  5:41 PM      Passed - Valid encounter within last 12 months    Recent Outpatient Visits           6 days ago Morbid obesity (HCC)   [facility] [name], NP   1 month ago Rash and nonspecific skin eruption   [facility] Mecum, Oswaldo Conroy, PA-C   4 months ago Appointment canceled by hospital   [facility] [name], NP   4 months ago Essential hypertension   [facility] [name], NP   6 months ago Essential hypertension   [facility] [name], NP       Future Appointments             In 2 weeks Gollan, Tollie Pizza, MD [facility] [employer]. [facility]   In 2 months [name], NP [facility], [facility]   In 10 months Willeen Niece, MD [facility]

## 2022-05-07 NOTE — Telephone Encounter (Signed)
Patient notified of Karen's message. States she has a dermatology appointment in November, this was the earliest they had for a new patient. States she is on the cancellation list to be seen sooner if she can.

## 2022-05-09 DIAGNOSIS — Z993 Dependence on wheelchair: Secondary | ICD-10-CM | POA: Diagnosis not present

## 2022-05-09 DIAGNOSIS — R3981 Functional urinary incontinence: Secondary | ICD-10-CM | POA: Diagnosis not present

## 2022-05-11 ENCOUNTER — Ambulatory Visit: Payer: Self-pay

## 2022-05-11 NOTE — Telephone Encounter (Signed)
  Chief Complaint: Large swollen right arm warm red Symptoms: Red swollen Frequency: today - started 1/1/02024 Pertinent Negatives: Patient denies  Disposition: [] ED /[x] Urgent Care (no appt availability in office) / [] Appointment(In office/virtual)/ []  [facility] [facility]/ [] Home Care/ [] Refused Recommended Disposition /[] [facility]/ []  Follow-up with PCP Additional Notes: PT states that her father came over and told her that her arm looks infected. Arm is swollen and red and hot.  PT will go to UC.    Reason for Disposition  Sore larger than a quarter (> 1 inch or > 2.5 cm across)  Answer Assessment - Initial Assessment Questions 1. APPEARANCE of IMPETIGO: "What does the rash look like?"      red 2. LOCATION: "Where is the rash located?"      Right arm 3. NUMBER: "How many sores are there?"      1  4. SIZE: "How big is the largest sore?" (inches, cm or compare to size of a coin)     softball 5. ONSET: "When did the sores start?"     Before 05/06/2022 6. OTHER SYMPTOMS: "Do you have any other symptoms?" (e.g., fever, pain)     Pt feels hot 7. PREGNANCY: "Is there any chance you are pregnant?" "When was your last menstrual period?"     no  Protocols used: Impetigo (Infected Sore)-A-AH

## 2022-05-12 ENCOUNTER — Emergency Department
Admission: EM | Admit: 2022-05-12 | Discharge: 2022-05-12 | Disposition: A | Discharge status: Home / Self Care | Payer: Medicaid Other | Attending: Emergency Medicine | Admitting: Emergency Medicine

## 2022-05-12 ENCOUNTER — Other Ambulatory Visit: Payer: Self-pay

## 2022-05-12 ENCOUNTER — Encounter: Payer: Self-pay | Admitting: Emergency Medicine

## 2022-05-12 DIAGNOSIS — I10 Essential (primary) hypertension: Secondary | ICD-10-CM | POA: Diagnosis not present

## 2022-05-12 DIAGNOSIS — L02414 Cutaneous abscess of left upper limb: Secondary | ICD-10-CM | POA: Diagnosis not present

## 2022-05-12 DIAGNOSIS — E119 Type 2 diabetes mellitus without complications: Secondary | ICD-10-CM | POA: Insufficient documentation

## 2022-05-12 DIAGNOSIS — R21 Rash and other nonspecific skin eruption: Secondary | ICD-10-CM | POA: Diagnosis present

## 2022-05-12 DIAGNOSIS — M79603 Pain in arm, unspecified: Secondary | ICD-10-CM | POA: Diagnosis not present

## 2022-05-12 DIAGNOSIS — I959 Hypotension, unspecified: Secondary | ICD-10-CM | POA: Diagnosis not present

## 2022-05-12 LAB — CBC WITH DIFFERENTIAL/PLATELET
Abs Immature Granulocytes: 0.08 10*3/uL — ABNORMAL HIGH (ref 0.00–0.07)
Basophils Absolute: 0.1 10*3/uL (ref 0.0–0.1)
Basophils Relative: 1 %
Eosinophils Absolute: 0.3 10*3/uL (ref 0.0–0.5)
Eosinophils Relative: 2 %
HCT: 42 % (ref 36.0–46.0)
Hemoglobin: 13.1 g/dL (ref 12.0–15.0)
Immature Granulocytes: 1 %
Lymphocytes Relative: 9 %
Lymphs Abs: 1.3 10*3/uL (ref 0.7–4.0)
MCH: 28.7 pg (ref 26.0–34.0)
MCHC: 31.2 g/dL (ref 30.0–36.0)
MCV: 91.9 fL (ref 80.0–100.0)
Monocytes Absolute: 1 10*3/uL (ref 0.1–1.0)
Monocytes Relative: 7 %
Neutro Abs: 12.2 10*3/uL — ABNORMAL HIGH (ref 1.7–7.7)
Neutrophils Relative %: 80 %
Platelets: 362 10*3/uL (ref 150–400)
RBC: 4.57 MIL/uL (ref 3.87–5.11)
RDW: 15 % (ref 11.5–15.5)
WBC: 15 10*3/uL — ABNORMAL HIGH (ref 4.0–10.5)
nRBC: 0 % (ref 0.0–0.2)

## 2022-05-12 LAB — COMPREHENSIVE METABOLIC PANEL
ALT: 16 U/L (ref 0–44)
AST: 16 U/L (ref 15–41)
Albumin: 3.3 g/dL — ABNORMAL LOW (ref 3.5–5.0)
Alkaline Phosphatase: 82 U/L (ref 38–126)
Anion gap: 9 (ref 5–15)
BUN: 14 mg/dL (ref 6–20)
CO2: 26 mmol/L (ref 22–32)
Calcium: 8.4 mg/dL — ABNORMAL LOW (ref 8.9–10.3)
Chloride: 97 mmol/L — ABNORMAL LOW (ref 98–111)
Creatinine, Ser: 0.81 mg/dL (ref 0.44–1.00)
GFR, Estimated: >60 mL/min (ref >60)
Glucose, Bld: 107 mg/dL — ABNORMAL HIGH (ref 70–99)
Potassium: 3.7 mmol/L (ref 3.5–5.1)
Sodium: 132 mmol/L — ABNORMAL LOW (ref 135–145)
Total Bilirubin: 1.3 mg/dL — ABNORMAL HIGH (ref 0.3–1.2)
Total Protein: 7.9 g/dL (ref 6.5–8.1)

## 2022-05-12 LAB — LACTIC ACID, PLASMA: Lactic Acid, Venous: 1.4 mmol/L (ref 0.5–1.9)

## 2022-05-12 MED ORDER — CLINDAMYCIN HCL 150 MG PO CAPS
450.0000 mg | ORAL_CAPSULE | Freq: Once | ORAL | Status: AC
  Administered 2022-05-12: 450 mg via ORAL
  Filled 2022-05-12: qty 3

## 2022-05-12 MED ORDER — CLINDAMYCIN HCL 150 MG PO CAPS: 450.0000 mg | ORAL_CAPSULE | Freq: Three times a day (TID) | ORAL | 0 refills | Status: AC

## 2022-05-12 NOTE — ED Provider Notes (Signed)
[facility] Provider Note   Event Date/Time   First MD Initiated Contact with Patient 05/12/22 1623     (approximate) History  Wound Infection  HPI Cheryl Chandler is a 57 y.o. female with a past history of morbid obesity and type 2 diabetes who presents complaining of a rash and purulent drainage to the left upper arm.  Patient states that this has been present over the last few weeks and has been given a topical antibiotic cream, prednisone, and hydroxyzine for treatment of her symptoms.  Patient states that the redness and drainage from the left upper arm have only worsened over this time.  Patient currently complains of a 10/10, burning/aching pain in the left upper arm as well as purulent drainage at the site. ROS: Patient currently denies any vision changes, tinnitus, difficulty speaking, facial droop, sore throat, chest pain, shortness of breath, abdominal pain, nausea/vomiting/diarrhea, dysuria, or weakness/numbness/paresthesias in any extremity   Physical Exam  Triage Vital Signs: ED Triage Vitals  Enc Vitals Group     BP 05/12/22 1500 (!) 160/60     Pulse Rate 05/12/22 1454 86     Resp 05/12/22 1454 18     Temp 05/12/22 1454 97.6 F (36.4 C)     Temp Source 05/12/22 1454 Oral     SpO2 05/12/22 1454 96 %     Weight 05/12/22 1508 280 lb (127 kg)     Height --      Head Circumference --      Peak Flow --      Pain Score 05/12/22 1500 10     Pain Loc --      Pain Edu? --      Excl. in [place]? --    Most recent vital signs: Vitals:   05/12/22 1504 05/12/22 1700  BP: (!) 160/86 (!) 134/52  Pulse:  87  Resp:  (!) 31  Temp:    SpO2:  95%   General: Awake, oriented x4. CV:  Good peripheral perfusion.  Resp:  Normal effort.  Abd:  No distention.  Other:  Middle-aged morbidly obese Caucasian female laying in bed in no acute distress.  There is a 10 cm area of erythema with a central 3 cm area of open purulent drainage at the left upper arm ED  Results / Procedures / Treatments  Labs (all labs ordered are listed, but only abnormal results are displayed) Labs Reviewed  CBC WITH DIFFERENTIAL/PLATELET - Abnormal; Notable for the following components:      Result Value   WBC 15.0 (*)    Neutro Abs 12.2 (*)    Abs Immature Granulocytes 0.08 (*)    All other components within normal limits  COMPREHENSIVE METABOLIC PANEL - Abnormal; Notable for the following components:   Sodium 132 (*)    Chloride 97 (*)    Glucose, Bld 107 (*)    Calcium 8.4 (*)    Albumin 3.3 (*)    Total Bilirubin 1.3 (*)    All other components within normal limits  AEROBIC CULTURE W GRAM STAIN (SUPERFICIAL SPECIMEN)  CULTURE, BLOOD (SINGLE)  LACTIC ACID, PLASMA   PROCEDURES: Critical Care performed: No .1-3 Lead EKG Interpretation  Performed by: Naaman Plummer, MD Authorized by: Naaman Plummer, MD     Interpretation: normal     ECG rate:  71   ECG rate assessment: normal     Rhythm: sinus rhythm     Ectopy: none  Conduction: normal    MEDICATIONS ORDERED IN ED: Medications  clindamycin (CLEOCIN) capsule 450 mg (has no administration in time range)   IMPRESSION / MDM / ASSESSMENT AND PLAN / ED COURSE  I reviewed the triage vital signs and the nursing notes.                             The patient is on the cardiac monitor to evaluate for evidence of arrhythmia and/or significant heart rate changes. Patient's presentation is most consistent with acute presentation with potential threat to life or bodily function. Presentation most consistent with simple Abscess. Given History, Exam, and Workup I have low suspicion for Cellulitis, Necrotizing Fasciitis, Pyomyositis, Sporotrichosis, Osteomyelitis or other emergent problem as a cause for this presentation. Interventions: none necessary as wound is already open and draining Rx: Clindamycin 450 mg 3 times daily x 7 days Disposition: Patient is stable for discharge, advised to follow up with  primary care physician in 48 hours.   FINAL CLINICAL IMPRESSION(S) / ED DIAGNOSES   Final diagnoses:  Abscess of left arm   Rx / DC Orders   ED Discharge Orders          Ordered    clindamycin (CLEOCIN) 150 MG capsule  3 times daily        05/12/22 1729           Note:  This document was prepared using Dragon voice recognition software and may include unintentional dictation errors.   Naaman Plummer, MD 05/12/22 [phone]

## 2022-05-12 NOTE — ED Triage Notes (Signed)
See first rn note - pt a/o, states that she has been taking antibiotics/meds as ordered without relief. Left arm wound draining, arm red warm to touch, painful.

## 2022-05-12 NOTE — ED Triage Notes (Signed)
First nurse note: Pt to ED via [facility] from home. Ems states 5 wk long reaction all over body. Pt covered in scabs from head to toe. Pt has been alternating anx and steroids. Pt main complaint today is left open wound on shoulder. Pt afebrile.

## 2022-05-12 NOTE — ED Notes (Signed)
Pt wheeled to lobby with all of her belongings  by nurse tech to wait for her sister to pick her up.

## 2022-05-12 NOTE — ED Provider Triage Note (Signed)
Emergency Medicine Provider Triage Evaluation Note  Seriah Tashima Scarpulla, a 57 y.o. female  was evaluated in triage.  Pt complains of persistent skin infections. She is currently under the care of her PCP with recent antibiotics.  Presents with a large weeping wound to the left upper arm.  She denies any FCS.  Review of Systems  Positive: LUE cellulitis Negative: FCS  Physical Exam  BP (!) 160/86   Pulse 85   Temp 97.6 F (36.4 C) (Oral)   Resp 18   Wt 127 kg   LMP 06/09/2017 (Approximate)   SpO2 100%   BMI 49.60 kg/m  Gen:   Awake, no distress  NAD Resp:  Normal effort  MSK:   Moves extremities without difficulty  Other:  Multiple scabbed papules over the trunk and extremities.  Left upper extremity with purulent weeping wound with surrounding erythema.  Medical Decision Making  Medically screening exam initiated at 3:29 PM.  Appropriate orders placed.  Annie Buffie Herne was informed that the remainder of the evaluation will be completed by another provider, this initial triage assessment does not replace that evaluation, and the importance of remaining in the ED until their evaluation is complete.  Patient to the ED for evaluation of LUE with cellulitis and open wound.   Melvenia Needles, PA-C 05/12/22 6629

## 2022-05-13 ENCOUNTER — Ambulatory Visit: Payer: Medicaid Other | Admitting: Nurse Practitioner

## 2022-05-15 LAB — AEROBIC CULTURE W GRAM STAIN (SUPERFICIAL SPECIMEN)
Gram Stain: NONE SEEN
Special Requests: NORMAL

## 2022-05-17 LAB — CULTURE, BLOOD (SINGLE)
Culture: NO GROWTH
Special Requests: ADEQUATE

## 2022-05-20 ENCOUNTER — Ambulatory Visit: Payer: Medicaid Other | Admitting: Pulmonary Disease

## 2022-05-21 ENCOUNTER — Other Ambulatory Visit: Payer: Medicaid Other | Admitting: Obstetrics and Gynecology

## 2022-05-21 ENCOUNTER — Encounter: Payer: Self-pay | Admitting: Obstetrics and Gynecology

## 2022-05-21 NOTE — Patient Outreach (Signed)
Medicaid Managed Care   Nurse Care Manager Note  05/21/2022 Name:  Cheryl Chandler MRN:  [id number] DOB:  04-04-1966  Cheryl Chandler is an 57 y.o. year old female who is a primary patient of Cheryl Billings, NP.  The [facility] Managed Care Coordination team was consulted for assistance with:    Chronic healthcare management needs, sleep apnea, HTN, DM, COPD, anxiety/depression, LBP, GERD, lymphedema, H/A, HLD, hypothyroid, arthritis  Cheryl Chandler was given information about Medicaid Managed Care Coordination team services today. [place] Patient agreed to services and verbal consent obtained.  Engaged with patient by telephone for follow up visit in response to provider referral for case management and/or care coordination services.   Assessments/Interventions:  Review of past medical history, allergies, medications, health status, including review of consultants reports, laboratory and other test data, was performed as part of comprehensive evaluation and provision of chronic care management services.  SDOH (Social Determinants of Health) assessments and interventions performed: [name]    Flowsheet Row Patient Outreach Telephone from 05/21/2022 in [place] Office Visit from 05/01/2022 in [place] Patient Outreach Telephone from 04/24/2022 in [place] Patient Outreach Telephone from 04/23/2022 in [place] Patient Outreach Telephone from 03/18/2022 in [place] Office Visit from 03/17/2022 in [place]  [name]        Food Insecurity Interventions -- -- -- Other (Comment)  [has food stamps and resources] -- --  Housing Interventions -- -- -- -- Intervention Not Indicated --  Transportation Interventions -- -- -- Patient Resources Tax adviser) -- --  Depression  Interventions/Treatment  -- Medication, Currently on Treatment -- -- -- Medication, Currently on Treatment  Financial Strain Interventions Other (Comment)  [referrals placed -BSW assisting] -- -- -- -- --  Physical Activity Interventions Other (Comments)  [not physically able to] -- -- -- -- --  Stress Interventions -- -- [employer], Provide Counseling -- -- --  Social Connections Interventions -- -- -- -- Intervention Not Indicated --     Care Plan  Allergies  Allergen Reactions   Codeine Shortness Of Breath and Rash   Percocet [Oxycodone-Acetaminophen] Shortness Of Breath   Sulfa Antibiotics Shortness Of Breath and Rash   Aspirin Hives   Bee Venom     Bee stings   Darvon [Propoxyphene]     Darvocet-N 100   Latex    Oxycodone Nausea And Vomiting   Penicillins     Has patient had a PCN reaction causing immediate rash, facial/tongue/throat swelling, SOB or lightheadedness with hypotension: Unknown Has patient had a PCN reaction causing severe rash involving mucus membranes or skin necrosis: Unknown Has patient had a PCN reaction that required hospitalization: Unknown Has patient had a PCN reaction occurring within the last 10 years: Unknown If all of the above answers are "NO", then may proceed with Cephalosporin use.   Medications Reviewed Today     Reviewed by [name], RN (Registered Nurse) on 05/21/22 at 1245  Med List Status: <None>   Medication Order Taking? Sig Documenting Provider Last Dose Status Informant  ACCU-CHEK GUIDE test strip [id number]  USE TO CHECK BLOOD SUGAR 3 TIMES DAILY AS DIRECTED Cheryl Billings, NP  Active   Accu-Chek Softclix Lancets lancets [id number] No CHECK BLOOD SUGAR TWICE DAILY Cheryl Billings, NP Taking Active   albuterol (PROVENTIL) (2.5 MG/3ML) 0.083% nebulizer solution [id number] No USE 1 VIAL  VIA NEBULIZER EVERY 4 HOURS AS NEEDED [name], NP Taking Active   amLODipine (NORVASC) 5 MG tablet [id number]   TAKE 1 TABLET BY MOUTH ONCE DAILY [name], NP  Active   atorvastatin (LIPITOR) 80 MG tablet [id number] No TAKE 1 TABLET BY MOUTH ONCE EVERY Charolotte Eke, NP Taking Active   Blood Glucose Monitoring Suppl (ACCU-CHEK GUIDE) w/Device KIT [id number] No Use to check blood sugar 3 times a day. Aura Dials T, NP Taking Active   Blood Pressure Monitoring (OMRON 3 SERIES BP MONITOR) DEVI [id number] No Use to check blood pressure as directed [name], NP Taking Active   citalopram (CELEXA) 40 MG tablet [id number] No TAKE 1 TABLET BY MOUTH ONCE DAILY [name], NP Taking Active   EPINEPHrine 0.3 mg/0.3 mL IJ SOAJ injection [id number] No INJECT 1 SYRINGE INTO OUTER THIGH ONCE AS NEEDED FOR SEVERE ALLERGIC REACTION. [name], NP Taking Active   Fluticasone-Umeclidin-Vilant (TRELEGY ELLIPTA) 100-62.5-25 MCG/ACT AEPB [id number] No Inhale 1 puff into the lungs daily. Salena Saner, MD Taking Active   Fluticasone-Umeclidin-Vilant [facility] ELLIPTA) 100-62.5-25 MCG/ACT AEPB [id number] No Inhale 1 puff into the lungs daily. Salena Saner, MD Taking Active   furosemide (LASIX) 20 MG tablet [id number] No TAKE 1 TABLET BY MOUTH ONCE DAILY IN AFTERNOON AS NEEDED LEG EDEMA [name], NP Taking Active   gabapentin (NEURONTIN) 300 MG capsule [id number] No TAKE 1 CAPSULE BY MOUTH 3 TIMES DAILY ASNEEDED (NEED OFFICE VISIT FOR FURTHER REFILLS) [name], NP Taking Active   hydrALAZINE (APRESOLINE) 50 MG tablet [id number] No Take 1 tablet (50 mg total) by mouth 3 (three) times daily. [name], NP Taking Active   hydrOXYzine (VISTARIL) 25 MG capsule [id number]  Take 1 capsule (25 mg total) by mouth every 8 (eight) hours as needed. [name], NP  Active   levothyroxine (SYNTHROID) 50 MCG tablet [id number] No TAKE 1 TABLET BY MOUTH ONCE DAILY ON AN EMPTY STOMACH. WAIT 30 MINUTES BEFORE TAKING OTHER MEDS. [name], NP Taking Active   lisinopril  (ZESTRIL) 40 MG tablet [id number] No TAKE 1 TABLET BY MOUTH ONCE DAILY [name], NP Taking Active   meloxicam (MOBIC) 15 MG tablet [id number] No TAKE 1 TABLET BY MOUTH ONCE DAILY AS NEEDED WITH FOOD OR MILK [name], NP Taking Active   montelukast (SINGULAIR) 10 MG tablet [id number] No TAKE 1 TABLET BY MOUTH AT BEDTIME [name], NP Taking Active   nitroGLYCERIN (NITROSTAT) 0.4 MG SL tablet [id number] No DISSOLVE 1 TABLET UNDER TONGUE AT ONSET OF CHEST PAIN. REPEAT IN 5 MIN IF NOT RESOLVED, MAX 3 DOSES. 911 IF NEEDED. [name], NP Taking Active   omeprazole (PRILOSEC) 20 MG capsule [id number] No TAKE 1 CAPSULE BY MOUTH ONCE DAILY [name], NP Taking Active   OXYGEN [id number] No Inhale 3 L into the lungs daily. [provider] Taking Active   spironolactone (ALDACTONE) 25 MG tablet [id number] No Take 0.5 tablets (12.5 mg total) by mouth daily. [name], NP Taking Active   TRULICITY 0.75 MG/0.5ML Namon Cirri [id number]  INJECT 0.75MG  SUBQ ONCE A WEEK [name], NP  Active   Vitamin D, Ergocalciferol, (DRISDOL) 1.25 MG (50000 UNIT) CAPS capsule [id number]  TAKE 1 CAPSULE BY MOUTH EVERY 7 DAYS [name], NP  Active            Patient Active Problem List   Diagnosis Date Noted   Venous ulcer (HCC) 05/03/2022   Foot pain, right 09/01/2021   Fibrocystic disease of both breasts  05/07/2019   IFG (impaired fasting glucose) 05/03/2019   Chronic venous insufficiency 11/09/2018   Overactive bladder 09/30/2018   Peripheral neuropathy 09/30/2018   Apnea 01/02/2016   Lymphedema 11/30/2014   Dyspnea on exertion 11/30/2014   Anxiety 11/29/2014   Arthritis 11/29/2014   COPD (chronic obstructive pulmonary disease) ([place]) 11/29/2014   Clinical depression 11/29/2014   H/O eating disorder 11/29/2014   Esophageal reflux 11/29/2014   Acne inversa 11/29/2014   Essential hypertension 11/29/2014   HLD (hyperlipidemia) 11/29/2014   Adult  hypothyroidism 11/29/2014   Insomnia 11/29/2014   Low back pain 11/29/2014   Morbid obesity ([place]) 11/29/2014   Posttraumatic stress disorder 11/29/2014   Vitamin D deficiency 11/29/2014   Nicotine dependence, cigarettes, uncomplicated 05/05/3233   Conditions to be addressed/monitored per PCP order:  Chronic healthcare management needs, sleep apnea, HTN, DM, COPD, anxiety/depression, LBP, GERD, lymphedema, H/A, HLD, hypothyroid, arthritis  Care Plan : RNCM: COPD (Adult)  Updates made by [name], RN since 05/21/2022 12:00 AM     Problem: RNCM: Psychological Adjustment to Diagnosis (COPD)   Priority: Medium  Onset Date: 05/22/2020     Long-Range Goal: RNCM: Adjustment to Disease Achieved   Start Date: 05/22/2020  Expected End Date: 07/23/2022  Recent Progress: On track  Priority: High  Note:   Current Barriers:  Knowledge deficits related to basic understanding of COPD disease process Knowledge deficits related to basic COPD self care/management Knowledge deficit related to basic understanding of how to use inhalers and how inhaled medications work Knowledge deficit related to importance of energy conservation Limited Social Support Unable to independently manage COPD Lacks social connections Does not contact provider office for questions/concerns  05/22/22:  Patient on 3L of Oxygen.  Has PULM appt 06/02/22.  No smoking.  Awaiting portable tank to be ordered.  Case Manager Clinical Goal(s): patient will report using inhalers as prescribed including rinsing mouth after use patient will be able to verbalize understanding of COPD action plan and when to seek appropriate levels of medical care  patient will engage in lite exercise as tolerated to build/regain stamina and strength and reduce shortness of breath through activity tolerance  patient will verbalize basic understanding of COPD disease process and self care activities  Interventions:  Inter-disciplinary care team  collaboration (see longitudinal plan of care) UNABLE to independently: manage COPD Provided patient with basic written and verbal COPD education on self care/management/and exacerbation prevention Provided patient with COPD action plan and reinforced importance of daily self assessment.  Provided written and verbal instructions on pursed lip breathing and utilized returned demonstration as teach back Provided instruction about proper use of medications used for management of COPD including inhalers Advised patient to self assesses COPD action plan zone and make appointment with provider if in the yellow zone for 48 hours without improvement. Provided patient with education about the role of exercise in the management of COPD Advised patient to engage in light exercise as tolerated 3-5 days a week Provided education about and advised patient to utilize infection prevention strategies to reduce risk of respiratory infection. Patient Goals/Self-Care Activities:  - decision-making supported - depression screen reviewed - emotional support provided - family involvement promoted - problem-solving facilitated - relaxation techniques promoted - verbalization of feelings encouraged Follow Up Plan: Telephone follow up appointment with care management team member scheduled.   Care Plan : RNCM: Hypertension (Adult)  Updates made by [name], RN since 05/21/2022 12:00 AM     Problem: RNCM: Hypertension (Hypertension)  Priority: Medium  Onset Date: 05/22/2020     Long-Range Goal: RNCM: Hypertension Monitored   Start Date: 05/22/2020  Expected End Date: 07/23/2022  Recent Progress: On track  Priority: Medium  Note:    Current Barriers:  Knowledge Deficits related to basic understanding of hypertension pathophysiology and self care management Knowledge Deficits related to understanding of medications prescribed for management of hypertension Limited Social Support Unable to independently  manage HTN Lacks social connections Does not contact provider office for questions/concern 05/21/22:  Patient seen and evaluated by PCP 05/01/22-BP 124/63.  Checks BP at home-165-175/60-70 per patient. Case Manager Clinical Goal(s):   patient will verbalize understanding of plan for hypertension management patient will demonstrate improved adherence to prescribed treatment plan for hypertension as evidenced by taking all medications as prescribed, monitoring and recording blood pressure as directed, adhering to low sodium/DASH diet patient will demonstrate improved health management independence as evidenced by checking blood pressure as directed and notifying PCP if SBP>160 or DBP > 90, taking all medications as prescribe, and adhering to a low sodium diet as discussed. Interventions:  Inter-disciplinary care team collaboration (see longitudinal plan of care) Evaluation of current treatment plan related to hypertension self management and patient's adherence to plan as established by provider.  Provided education to patient re: stroke prevention, s/s of heart attack and stroke, DASH diet, complications of uncontrolled blood pressure Reviewed medications with patient and discussed importance of compliance.  Discussed plans with patient for ongoing care management follow up and provided patient with direct contact information for care management team Advised patient, providing education and rationale, to monitor blood pressure daily and record, calling PCP for findings outside established parameters.  Patient Goals/Self-Care Activities Over the next 120 days, patient will:  - Self administers medications as prescribed Attends all scheduled provider appointments Calls provider office for new concerns, questions, or BP outside discussed parameters Checks BP and records as discussed Follows a low sodium diet/DASH diet - blood pressure trends reviewed - depression screen reviewed - home or ambulatory  blood pressure monitoring encouraged Follow Up Plan: Telephone follow up appointment with care management team member scheduled.     Care Plan : RNCM: Depression (Adult)  Updates made by [name], RN since 05/21/2022 12:00 AM     Problem: RNCM: Symptoms (Depression)   Priority: High  Onset Date: 12/02/2020     Long-Range Goal: RNCM: Symptoms Monitored and Managed   Start Date: 12/02/2020  Expected End Date: 05/21/2023  Recent Progress: Not on track  Priority: High  Note:   Current Barriers:  Ineffective Self Health Maintenance in a patient with Anxiety and Depression Unable to independently manage depression and anxiety as evidence of multiple chronic conditions and the patient feeling her health is out of control  Does not attend all scheduled provider appointments Lacks social connections Unable to perform IADLs independently Does not contact provider office for questions/concerns 05/21/22:  No complaints today-BSW and LCSW following Clinical Goal(s):  Collaboration with [name], NP regarding development and update of comprehensive plan of care as evidenced by provider attestation and co-signature Inter-disciplinary care team collaboration (see longitudinal plan of care) patient will work with care management team to address care coordination and chronic disease management needs related to Disease Management Educational Needs Care Coordination Mental Health Counseling Level of Care Concerns   Interventions:  Evaluation of current treatment plan related to Anxiety and Depression, Financial constraints related to insurance not approving care for the patient to go to wound care ,  Limited social support, Level of care concerns, ADL IADL limitations, Mental Health Concerns , Family and relationship dysfunction, Substance abuse issues -   , and Inability to perform IADL's independently self-management and patient's adherence to plan as established by  provider. Inter-disciplinary care team collaboration (see longitudinal plan of care) Discussed plans with patient for ongoing care management follow up and provided patient with direct contact information for care management team Evaluation of depression and anxiety.  LCSW referral for increased stress-completed Collaborated with LCSW  Self Care Activities:  Patient verbalizes understanding of plan to effectively manage depression and anxiety Self administers medications as prescribed Attends all scheduled provider appointments Calls pharmacy for medication refills Attends church or other social activities Performs ADL's independently Performs IADL's independently Calls provider office for new concerns or questions Work with the [facility] team to meet needs for continued CCM services  Patient Goals: - activity or exercise based on tolerance encouraged - depression screen reviewed - emotional support provided - healthy lifestyle promoted - medication side effects monitored and managed - pain managed - participation in mental health treatment encouraged - quality of sleep assessed - response to mental health treatment monitored - response to pharmacologic therapy monitored - sleep hygiene techniques encouraged - social activities and relationships encouraged - substance use assessed Follow Up Plan: The care management team will reach out to the patient again over the next 30 business days.   Follow Up:  Patient agrees to Care Plan and Follow-up.  Plan: The Managed Medicaid care management team will reach out to the patient again over the next 30 business  days. and The  Patient has been provided with contact information for the Managed Medicaid care management team and has been advised to call with any health related questions or concerns.  Date/time of next scheduled RN care management/care coordination outreach:  06/18/22 at 230

## 2022-05-21 NOTE — Patient Instructions (Signed)
Visit Information  Ms. Noorani was given information about Medicaid Managed Care team care coordination services as a part of their [facility] Medicaid benefit. Shadonna Waylynn Benefiel verbally consented to engagement with the [facility] Managed Care team.   If you are experiencing a medical emergency, please call 911 or report to your local emergency department or urgent care.   If you have a non-emergency medical problem during routine business hours, please contact your provider's office and ask to speak with a nurse.   For questions related to your [facility] health plan, please call: [phone] or go here:[email or web address]  If you would like to schedule transportation through your [facility] plan, please call the following number at least 2 days in advance of your appointment: [phone].  You can also use the MTM portal or MTM mobile app to manage your rides. For the portal, please go to [email or web address].  Call the [place] at [phone], at any time, 24 hours a day, 7 days a week. If you are in danger or need immediate medical attention call 911.  If you would like help to quit smoking, call 1-800-QUIT-NOW (607)195-6514) OR Espaol: 1-855-Djelo-Ya (8-144-818-5631) o para ms informacin haga clic aqu or Text READY to 200-400 to register via text  Ms. Vonada - following are the goals we discussed in your visit today:   Goals Addressed             This Visit's Progress    RNCM: Keep Skin Clean and Dry       Follow Up Date: 05/21/22  - clean and dry skin well - keep skin dry - use a fragrance-free lotion on skin - wear a protective pad or garment    Why is this important?   Leaking urine (pee) can cause soreness from skin rashes and redness.  This is caused by skin being exposed to urine (pee).   05/21/22:  seen in ED 1/16 for draining open wound left shoulder-currently on Abx-scabs over body-DERM appt scheduled     RNCM:  Track and Manage My Symptoms-Depression       Timeframe:  Long-Range Goal Priority:  High Start Date:    12-02-2020                         Expected End Date:   ongoing                Follow Up Date: 06/18/22   - avoid negative self-talk - develop a personal safety plan - develop a plan to deal with triggers like holidays, anniversaries - exercise at least 2 to 3 times per week - have a plan for how to handle bad days - journal feelings and what helps to feel better or worse - spend time or talk with others at least 2 to 3 times per week - spend time or talk with others every day - watch for early signs of feeling worse    Why is this important?   Keeping track of your progress will help your treatment team find the right mix of medicine and therapy for you.  Write in your journal every day.  Day-to-day changes in depression symptoms are normal. It may be more helpful to check your progress at the end of each week instead of every day.    05/18/22:  LCSW and BSW following     RNCM: Track and Manage My Triggers-COPD       Timeframe:  [employer]  Goal Priority:  Medium Start Date:     05-22-2020                        Expected End Date:   ongoing          Follow Up Date: 06/18/22   - avoid second hand smoke - eliminate smoking in my home - identify and avoid work-related triggers - identify and remove indoor air pollutants - limit outdoor activity during cold weather - listen for public air quality announcements every day    Why is this important?   Triggers are activities or things, like tobacco smoke or cold weather, that make your COPD (chronic obstructive pulmonary disease) flare-up.  Knowing these triggers helps you plan how to stay away from them.  When you cannot remove them, you can learn how to manage them.   05/18/22: on Oxygen 3L, has PULM appt 06/02/22   The patient verbalized understanding of instructions, educational materials, and care plan provided today and DECLINED  offer to receive copy of patient instructions, educational materials, and care plan.   The Managed Medicaid care management team will reach out to the patient again over the next 30 business  days.  The  Patient has been provided with contact information for the Managed Medicaid care management team and has been advised to call with any health related questions or concerns.   Kathi Der RN, BSN   [facility] Care Management Coordinator - Managed Medicaid High Risk [phone]   Following is a copy of your plan of care:  Care Plan : RNCM: COPD (Adult)  Updates made by [name], RN since 05/21/2022 12:00 AM     Problem: RNCM: Psychological Adjustment to Diagnosis (COPD)   Priority: Medium  Onset Date: 05/22/2020     Long-Range Goal: RNCM: Adjustment to Disease Achieved   Start Date: 05/22/2020  Expected End Date: 07/23/2022  Recent Progress: On track  Priority: High  Note:   Current Barriers:  Knowledge deficits related to basic understanding of COPD disease process Knowledge deficits related to basic COPD self care/management Knowledge deficit related to basic understanding of how to use inhalers and how inhaled medications work Knowledge deficit related to importance of energy conservation Limited Social Support Unable to independently manage COPD Lacks social connections Does not contact provider office for questions/concerns  05/22/22:  Patient on 3L of Oxygen.  Has PULM appt 06/02/22.  No smoking.  Awaiting portable tank to be ordered.  Case Manager Clinical Goal(s): patient will report using inhalers as prescribed including rinsing mouth after use patient will be able to verbalize understanding of COPD action plan and when to seek appropriate levels of medical care  patient will engage in lite exercise as tolerated to build/regain stamina and strength and reduce shortness of breath through activity tolerance  patient will verbalize basic  understanding of COPD disease process and self care activities  Interventions:  Inter-disciplinary care team collaboration (see longitudinal plan of care) UNABLE to independently: manage COPD Provided patient with basic written and verbal COPD education on self care/management/and exacerbation prevention Provided patient with COPD action plan and reinforced importance of daily self assessment.  Provided written and verbal instructions on pursed lip breathing and utilized returned demonstration as teach back Provided instruction about proper use of medications used for management of COPD including inhalers Advised patient to self assesses COPD action plan zone and make appointment with provider if in the yellow zone for  48 hours without improvement. Provided patient with education about the role of exercise in the management of COPD Advised patient to engage in light exercise as tolerated 3-5 days a week Provided education about and advised patient to utilize infection prevention strategies to reduce risk of respiratory infection. Patient Goals/Self-Care Activities:  - decision-making supported - depression screen reviewed - emotional support provided - family involvement promoted - problem-solving facilitated - relaxation techniques promoted - verbalization of feelings encouraged Follow Up Plan: Telephone follow up appointment with care management team member scheduled.   Care Plan : RNCM: Hypertension (Adult)  Updates made by [name], RN since 05/21/2022 12:00 AM     Problem: RNCM: Hypertension (Hypertension)   Priority: Medium  Onset Date: 05/22/2020     Long-Range Goal: RNCM: Hypertension Monitored   Start Date: 05/22/2020  Expected End Date: 07/23/2022  Recent Progress: On track  Priority: Medium  Note:    Current Barriers:  Knowledge Deficits related to basic understanding of hypertension pathophysiology and self care management Knowledge Deficits related to  understanding of medications prescribed for management of hypertension Limited Social Support Unable to independently manage HTN Lacks social connections Does not contact provider office for questions/concern 05/21/22:  Patient seen and evaluated by PCP 05/01/22-BP 124/63.  Checks BP at home-165-175/60-70 per patient. Case Manager Clinical Goal(s):   patient will verbalize understanding of plan for hypertension management patient will demonstrate improved adherence to prescribed treatment plan for hypertension as evidenced by taking all medications as prescribed, monitoring and recording blood pressure as directed, adhering to low sodium/DASH diet patient will demonstrate improved health management independence as evidenced by checking blood pressure as directed and notifying PCP if SBP>160 or DBP > 90, taking all medications as prescribe, and adhering to a low sodium diet as discussed. Interventions:  Inter-disciplinary care team collaboration (see longitudinal plan of care) Evaluation of current treatment plan related to hypertension self management and patient's adherence to plan as established by provider.  Provided education to patient re: stroke prevention, s/s of heart attack and stroke, DASH diet, complications of uncontrolled blood pressure Reviewed medications with patient and discussed importance of compliance.  Discussed plans with patient for ongoing care management follow up and provided patient with direct contact information for care management team Advised patient, providing education and rationale, to monitor blood pressure daily and record, calling PCP for findings outside established parameters.  Patient Goals/Self-Care Activities Over the next 120 days, patient will:  - Self administers medications as prescribed Attends all scheduled provider appointments Calls provider office for new concerns, questions, or BP outside discussed parameters Checks BP and records as  discussed Follows a low sodium diet/DASH diet - blood pressure trends reviewed - depression screen reviewed - home or ambulatory blood pressure monitoring encouraged Follow Up Plan: Telephone follow up appointment with care management team member scheduled.     Care Plan : RNCM: Depression (Adult)  Updates made by [name], RN since 05/21/2022 12:00 AM     Problem: RNCM: Symptoms (Depression)   Priority: High  Onset Date: 12/02/2020     Long-Range Goal: RNCM: Symptoms Monitored and Managed   Start Date: 12/02/2020  Expected End Date: 05/21/2023  Recent Progress: Not on track  Priority: High  Note:   Current Barriers:  Ineffective Self Health Maintenance in a patient with Anxiety and Depression Unable to independently manage depression and anxiety as evidence of multiple chronic conditions and the patient feeling her health is out of control  Does not  attend all scheduled provider appointments Lacks social connections Unable to perform IADLs independently Does not contact provider office for questions/concerns 05/21/22:  No complaints today-BSW and LCSW following Clinical Goal(s):  Collaboration with [name], NP regarding development and update of comprehensive plan of care as evidenced by provider attestation and co-signature Inter-disciplinary care team collaboration (see longitudinal plan of care) patient will work with care management team to address care coordination and chronic disease management needs related to Disease Management Educational Needs Care Coordination Mental Health Counseling Level of Care Concerns   Interventions:  Evaluation of current treatment plan related to Anxiety and Depression, Financial constraints related to insurance not approving care for the patient to go to wound care , Limited social support, Level of care concerns, ADL IADL limitations, Mental Health Concerns , Family and relationship dysfunction, Substance abuse issues -   , and  Inability to perform IADL's independently self-management and patient's adherence to plan as established by provider. Inter-disciplinary care team collaboration (see longitudinal plan of care) Discussed plans with patient for ongoing care management follow up and provided patient with direct contact information for care management team Evaluation of depression and anxiety.  LCSW referral for increased stress-completed Collaborated with LCSW  Self Care Activities:  Patient verbalizes understanding of plan to effectively manage depression and anxiety Self administers medications as prescribed Attends all scheduled provider appointments Calls pharmacy for medication refills Attends church or other social activities Performs ADL's independently Performs IADL's independently Calls provider office for new concerns or questions Work with the [facility] team to meet needs for continued CCM services  Patient Goals: - activity or exercise based on tolerance encouraged - depression screen reviewed - emotional support provided - healthy lifestyle promoted - medication side effects monitored and managed - pain managed - participation in mental health treatment encouraged - quality of sleep assessed - response to mental health treatment monitored - response to pharmacologic therapy monitored - sleep hygiene techniques encouraged - social activities and relationships encouraged - substance use assessed Follow Up Plan: The care management team will reach out to the patient again over the next 30 business days.

## 2022-05-25 ENCOUNTER — Ambulatory Visit: Payer: Medicaid Other | Admitting: Cardiovascular Disease

## 2022-05-26 DIAGNOSIS — G4736 Sleep related hypoventilation in conditions classified elsewhere: Secondary | ICD-10-CM | POA: Diagnosis not present

## 2022-05-28 ENCOUNTER — Telehealth: Payer: Self-pay | Admitting: Pulmonary Disease

## 2022-05-28 ENCOUNTER — Ambulatory Visit: Payer: Medicaid Other | Admitting: Pulmonary Disease

## 2022-05-28 DIAGNOSIS — Z419 Encounter for procedure for purposes other than remedying health state, unspecified: Secondary | ICD-10-CM | POA: Diagnosis not present

## 2022-05-28 NOTE — Telephone Encounter (Signed)
She was suppose to have a Echo and PFT before she comes back to see Dr. Patsey Berthold. Can you call and get her scheduled for those? Thank you!

## 2022-05-28 NOTE — Telephone Encounter (Signed)
We have CXL her appt with Dr. Patsey Berthold on 06/02/22 because she doesn't have the test completed. Her PFT appt has been scheduled on 06/23/22 @ 4:00pm at the [facility] and her Echo appt has been rescheduled on 07/03/22 with [facility] I have spoke with the patient and she is aware but also wanted the information mailed to her which I have done

## 2022-06-01 ENCOUNTER — Other Ambulatory Visit: Payer: Medicaid Other

## 2022-06-01 NOTE — Patient Instructions (Signed)
Visit Information  Cheryl Chandler was given information about Medicaid Managed Care team care coordination services as a part of their [facility] Medicaid benefit. Cheryl Chandler verbally consented to engagement with the [facility] Managed Care team.   If you are experiencing a medical emergency, please call 911 or report to your local emergency department or urgent care.   If you have a non-emergency medical problem during routine business hours, please contact your provider's office and ask to speak with a nurse.   For questions related to your [facility] health plan, please call: [phone] or go here:[email or web address]  If you would like to schedule transportation through your [facility] plan, please call the following number at least 2 days in advance of your appointment: [phone].  You can also use the MTM portal or MTM mobile app to manage your rides. For the portal, please go to [email or web address]  Call the [facility] Crisis Line at [phone], at any time, 24 hours a day, 7 days a week. If you are in danger or need immediate medical attention call 911.  If you would like help to quit smoking, call 1-800-QUIT-NOW ((640) 268-2684) OR Espaol: 1-855-Djelo-Ya (8-828-003-4917) o para ms informacin haga clic aqu or Text READY to 915-056 to register via text  Cheryl Chandler - following are the goals we discussed in your visit today:   Goals Addressed   None       Social Worker will follow up in 30 days on 06/30/22.   Cheryl Chandler, BSW, [facility]  [place]  High Risk Managed Medicaid Team  [phone]   Following is a copy of your plan of care:  Care Plan : General Social Work (Adult)  Updates made by [name] since 06/01/2022 12:00 AM     Problem: Coping Skills (General Plan of Care)      Long-Range Goal: Coping Skills Enhanced   Start Date: 11/07/2020  Expected End Date: 03/07/2022  Recent  Progress: On track  Priority: High  Note:   Timeframe:  Long-Range Goal Priority:  High Start Date:       08/26/21                    Expected End Date:  ongoing   Current Barriers:  Financial constraints Mental Health Concerns  Social Isolation Limited education about mental health support resources that are available to her within the local area* Cognitive Deficits Lacks knowledge of community resource   Clinical Social Work Clinical Goal(s):  Over the next 90 days, client will work with SW to address concerns related to experiencing ongoing symptoms of depression, sadness and grief since the passing of her mother Over the next 90 days, patient will work with LCSW to address needs related to implementing appropriate self-care and depression management.    Interventions: Patient interviewed and appropriate assessments performed Provided mental health counseling with regards to patient's loss and ongoing stressors. Patient was very close with her mother who passed away a few years ago and she continues to experience symptoms of grief from this loss. Patient denies wanting LCSW to make a referral for grief counseling through [facility] again but was receptive to coping skill and resource education/information that was provided during session today. Patient reports decorating for the holidays helps her to connect with her mother as they did this together when she was alive.  Provided patient with information about managing depression within her daily life. Patient has ongoing grief symptoms as well. Patient  states "I feel worn down." Patient reports that she talks out loud to her mother's picture and this is very helpful to her. Grief support resource education provided again during session today.  Provided reflective listening and implemented appropriate interventions to help support patient and her emotional needs  Discussed plans with patient for ongoing care management follow up and  provided patient with direct contact information for [facility] team Advised patient to contact [facility] team with any urgent case management needs. Assisted patient/caregiver with obtaining information about health plan benefits LCSW completed past referral for family to have a wheelchair ramp built at their residence due to ongoing mobility concerns. Ramp has been successfully installed.  PCS actively involved with patient and providing ongoing services.  Positive reinforcement provided to patient for quitting smoking cigarettes. Coping skill review education provided for future triggers.  Brief self-care education provided to both patient and spouse. Patient confirms that she continues to go to food pantry for food insecurity. Resource information has been provided.  Patient reports ongoing pain and issues with mobility. Patient shares that her pain resides in her pains and legs. Emotional support and coping skill education provided. Patient confirms that her PCS aide continues to provide her with services. However, aide has to be reminded to put on her mask since patient is high risk. LCSW reviewed resources that [name] provided her with in the past.  Patient reports that she had a wonderful birthday spent with her family this past May. Patient reports receiving appropriate socialization. Patient reports that her spouse took her out to eat and gifted her with several things which was a nice surprise.  Patient confirms stable transportation arrangements for upcoming PCP appointment this week.  Patient repots that she is losing a lot of weight but is not sure how much she has lost as she does not have a scale. Patient reports that a goal of hers is to lose weight but she has not been intentionally losing weight at this time.  Patient reports that her aide [place] continues to assist her with PCS and is an excellent support within the home for her and spouse.  Patient planted a tomato plant and successfully  grew over 5 tomatoes over the summer. Patient is very proud of this achievement as this was her first time planting a tomato plant and is now interested in picking up gardening/planting as a hobby. Patient reports that her aunt continues to be a positive support for her in her life and someone that she can rely on in the case of emergencies.  Patient was educated on healthy self-care skills and advised her to use these into her daily routine.  04/10/21- Update- Patient is agreeable to social work closure at this time as she still does not need medication management or counseling as she is managing her mental health well at this time. Patient talks to her younger sister everyday to gain emotional support. Patient says that her sister is into body building and helps to educate and remind her to do her own self-care.  Patient reports that she has been elevating her legs daily to elevate pain and swelling.  [facility] LCSW completed care coordination with Osa Craver on 01/15/21 and [facility] LCSW provided the following suggested resource-It is likely their best option given their loss of housing and lack of consistent income:   Research scientist (physical sciences)                                                                                                                       (  Emergency Crisis Housing) [phone]) [phone] Ext. 104 M-F, 8am - 4pm Patient was successfully approved for food stamps but is still experiencing financial concerns. She reports that she and her husband have no clothes that fit them and they are having to start using a string for their pants and patient only has one bra at this time. [facility] LCSW will send referral to [facility] BSW today for financial support.  BSW spoke with patient about resources needed. Patient states she does receive SSI and was approved for foodstamps. Patient is familiar with all of the food pantries and other resources in [place]. BSW will assist patient with finding an eye  doctor. Patient states she has not been in 10 years. BSW will research and put a list of eye doctors together that accept Medicaid in [facility]. 04/30/20: BSW contacted and spoke with patient, she stated she was not doing good because she found out that she has a new landlord and they are going up on her rent. BSW informed patient to make sure she informs her social workers at [employer] that they have an increase for foodstamps and Medicaid. Patient stated that she sleeps on the couch and is in need of a power recliner/lift chair. BSW will send patients PCP a message asking for a script to be sent. 05/21/21: BSW completed follow up telephone call with patient. She states she is unsure if their new landlord has received their January rent. Patient states she is also worried due to the new landlord making them pay rent online and on the 1st of the month. Patient states she is unable to get anyone on the phone. Patient states her rent will go up to 400 from 255 in March. 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 No other resources needed at this time.   06/20/21: BSW completed follow up telelphone call with patient. Patient states she is doing well. Patient states Rolene Arbour will cover all of Vaccines. Patient states no resources are needed at this time. 08/26/21: BSW completed telephone outreach with patient, she stated since going to the hospital she has been feeling weak. Patient states other than that she is feeling okay. Patient stated she is going to get a new phone on Wednesday. Patient states her birthday is on Friday and her husband may take her to a [place]. No other resources are needed at this time.  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 Car is doing better, they are  still able to drive and get around. Patient states husbands dad has been helping them with there transportation and bills. They landlord has gone up to 600 on lot rent, patient states they are going to have to sell there home and move.  Update- Patient reports experiencing additional grief lately. [facility] LCSW offered referral for berievement counseling but she declined. UPDATE 08/26/21- Patient reports that she has a near death experience last month. Patient shares that she had a fall. 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 [facility] LCSW completed education on available mental health resources within her area to utilize. Patient admits to experiencing some sadness due to grief from her mother's passing. Emotional support provided. 10/13/21 UPDATE- Patient reports that she is working hard on living a healthier life both physically and mentally. Patient reports  that she is making active changes in her diet. She reports that she eventually wishes to quit smoking again and has started picking up new hobbies to implement during times of craving such as coloring, crocheting and being creative. Patient reports that she is unsure if she needs mental health support at this time because her main concerns are centered around her and her spouse's physical health instead their mental health. [facility] LCSW educated patient on how they affect one another and their importance. Patient denies any urgent concerns. She reports no issues with affording food due to food stamp assistance. [facility] LCSW will reassess social work needs 30 days from now. UPDATE 11/17/21- Patient reports that she is experiencing several stressors at this time. She was encouraged to consider mental health treatment but she is not interested at this time. Patient was encouraged to consider mental health resources. Patient reports that their car is in the shop and that they have no stable transportation besides their sister and her husband's father. Patient reports that  her spouse has had several recent health issues that have affected her mental state. Patient was educated on healthy coping skills for stress. Patient denies no urgent concerns or issues at this time. UPDATE- Care Coordination completed with [facility] LCSW, PCP office, Pharmacist, [facility] BSW and PCP. Patient has canceled several medical appointments because they are unable to afford a copay. Patient will benefit from financial assistance and may benefit from a [facility] application. Patient has an upcoming appointment with [facility] BSW next week. Patient is agreeable to exploring financial assistance options with [facility] BSW that will help alleviate other areas of financial stress within their life in order for patient to be able to afford medical expenses. [facility] LCSW encouraged patient to consider individual counseling. Patient is agreeable to referral for counseling but needs to find out her sister's email first as she is unable to go to office to fill out intake paperwork. Patient prefers virtual counseling to save money on transportation. [facility] LCSW will successfully place referral to [facility] during next outreach. [facility] LCSW completed care coordination with [facility] BSW.  BSW 12/15/21: BSW completed a telephone outreach with patient. She stated everything is still going okay. She has been having problems with her leg wraps and has an appointment today. She states her husband is still having issues with his eye and with the doctor and she is going to make a compliant. Patient states no resources are needed at this time. [facility] LCSW 01/19/22: Patient reports that she was unable to sleep last night. She is experiencing a break out skin reaction that she feels is caused by stress. Her financial stressors have increased since last session. Patient is agreeable to [facility] LCSW placing referral for [facility] now but reports that she has no email address. [facility] LCSW completed referral and made note of her inability to get into her email.  [facility] LCSW will follow up within two weeks to ensure that she was enrolled in [facility] counseling program. [facility] completed care coordination call ONLY to [facility] and they were unable to locate patient's referral that was made on 01/19/22. [facility] LCSW resubmitted this referral today on 01/29/22 for telephonic counseling. Patient was made aware by telephone call that [facility] will contact her by the end of the day tomorrow to schedule a time for her to come into their office to complete in person intake paperwork. Patient is agreeable to this plan. UPDATE 02/03/22- Patient reports that she missed  a call from June the receptionist at [place] and call her back but has not heard back yet. [facility] LCSW provided patient with her number and directions on which prompt to enter to be directed to June. Patient will ask June about her phone as patient is unsure if she can make video calls and complete telephonic therapy which would mean she would need to go for in person sessions. Patient is agreeable to plan. Patient was able to find an assistance program for rent through [facility] on her own. Positive reinforcement provided for this resource find. [facility] LCSW collaborated with entire team. [facility] LCSW will follow up in two weeks to ensure that patient was able to get connected with either telephonic or in person therapy services at [facility]. UPDATE 02/23/22-Patient has decided to decline therapy at this time stating that she "just has too much going on to add in anything else into her schedule." [facility] LCSW will update team on this decision. Reflective and active listening provided during session. [facility] LCSW will follow up in 90 days to ask patient again about mental health support involvement. Patient is agreeable to this plan. 04/24/22- Patient reports that she is still experiencing a skin reaction from off brand dish soap that she used. She reports that this has been going on for months.  Healthy self-care education provided to patient to promote healthy hygiene. Patient reports that she is still not interested in counseling right now but confirms that she is stable. She reports that she is now on oxygen which she started back in the fall of 2023. She shares that she has NOT been smoking. Positive reinforcement provided for this. Patient reports that she put up two Christmas trees this year and one was her mother's. Patient reports that her mother's Christmas tree serves as a reminder of the love and special relationship that she had with her mother.  BSW completed a telephone outreach with patient. She stated she is still itching really bad and thinks she has shingles. She has put up her Christmas decorations and has 2 trees. Patient states she has been stressed about medical bills, but the holidays are keeping her in good spirits. Patient states they are still having financial issues.  BSW completed a telephone outreach with patient. She stated she is doing well. They were able to go to a wholesale store in [place] and get some food. Patient stated she has some appointments coming up including the skin doctor but that is not until November. Patient stated she is on the list for a sooner appointment if someone cancels. BSW contacted other skin centers in the area and they do not accept patients insurance. No other resources are needed at this time.  Managing Loss, Adult People experience loss in many different ways throughout their lives. Events such as moving, changing jobs, and losing friends can create a sense of loss. The loss may be as serious as a major health change, divorce, death of a pet, or death of a loved one. All of these types of loss are likely to create a physical and emotional reaction known as grief. Grief is the result of a major change or an absence of something or someone that you count on. Grief is a normal reaction to loss. A variety of factors can affect your grieving  experience, including: The nature of your loss. Your relationship to what or whom you lost. Your understanding of grief and how to manage it. Your support system. How to  manage lifestyle changes Keep to your normal routine as much as possible. If you have trouble focusing or doing normal activities, it is acceptable to take some time away from your normal routine. Spend time with friends and loved ones. Eat a healthy diet, get plenty of sleep, and rest when you feel tired. How to recognize changes  The way that you deal with your grief will affect your ability to function as you normally do. When grieving, you may experience these changes: Numbness, shock, sadness, anxiety, anger, denial, and guilt. Thoughts about death. Unexpected crying. A physical sensation of emptiness in your stomach. Problems sleeping and eating. Tiredness (fatigue). Loss of interest in normal activities. Dreaming about or imagining seeing the person who died. A need to remember what or whom you lost. Difficulty thinking about anything other than your loss for a period of time. Relief. If you have been expecting the loss for a while, you may feel a sense of relief when it happens. Follow these instructions at home:    Activity Express your feelings in healthy ways, such as: Talking with others about your loss. It may be helpful to find others who have had a similar loss, such as a support group. Writing down your feelings in a journal. Doing physical activities to release stress and emotional energy. Doing creative activities like painting, sculpting, or playing or listening to music. Practicing resilience. This is the ability to recover and adjust after facing challenges. Reading some resources that encourage resilience may help you to learn ways to practice those behaviors. General instructions Be patient with yourself and others. Allow the grieving process to happen, and remember that grieving takes  time. It is likely that you may never feel completely done with some grief. You may find a way to move on while still cherishing memories and feelings about your loss. Accepting your loss is a process. It can take months or longer to adjust. Keep all follow-up visits as told by your health care provider. This is important. Where to find support To get support for managing loss: Ask your health care provider for help and recommendations, such as grief counseling or therapy. Think about joining a support group for people who are managing a loss. Where to find more information You can find more information about managing loss from: American Society of Clinical Oncology: www.cancer.net American Psychological Association: www.apa.org DiceTournament.caContact a health care provider if: Your grief is extreme and keeps getting worse. You have ongoing grief that does not improve. Your body shows symptoms of grief, such as illness. You feel depressed, anxious, or lonely. Get help right away if: You have thoughts about hurting yourself or others. If you ever feel like you may hurt yourself or others, or have thoughts about taking your own life, get help right away. You can go to your nearest emergency department or call: Your local emergency services (911 in the [place] A suicide crisis helpline, such as the National Suicide Prevention Lifeline at 781-817-93211-564-067-0107. This is open 24 hours a day. Summary Grief is the result of a major change or an absence of someone or something that you count on. Grief is a normal reaction to loss. The depth of grief and the period of recovery depend on the type of loss and your ability to adjust to the change and process your feelings. Processing grief requires patience and a willingness to accept your feelings and talk about your loss with people who are supportive. It is important to find resources  that work for you and to realize that people experience grief differently. There is not  one grieving process that works for everyone in the same way. Be aware that when grief becomes extreme, it can lead to more severe issues like isolation, depression, anxiety, or suicidal thoughts. Talk with your health care provider if you have any of these issues. This information is not intended to replace advice given to you by your health care provider. Make sure you discuss any questions you have with your health care provider. Document Revised: 06/17/2018 Document Reviewed: 08/27/2016 Elsevier Patient Education  [place] for Managing Grief   When you are experiencing grief, many resources and support are available to help you. You are not alone.    Grief Share ([email or web address]://jacobson-[email or web address]):    [employer] [facility] [place], [place]  [phone] [address], [place], [place], [place], [place]   [facility] [address] [place], [place]   [place], [place]   If a Hospice or [place] team has been involved in the care of your loved one, you may reach out to your contact there or locally, you may reach out to [facility]:    Hospice and [place] [facility]   [place], [place] [address] [phone] Patient was educated on healthy self-care as she admits she has little motivation to walk lately. PCS recommendation was suggested.   Patient Self Care Activities:  Attends all scheduled provider appointments Calls provider office for new concerns or questions  Please see past updates related to this goal by clicking on the "Past Updates" button in the selected goal

## 2022-06-01 NOTE — Patient Outreach (Signed)
Medicaid Managed Care Social Work Note  06/01/2022 Name:  Maryann Mccall MRN:  [id number] DOB:  [birth year]  Cheryl Chandler is an 57 y.o. year old female who is a primary patient of [name], NP.  The Medicaid Managed Care Coordination team was consulted for assistance with:  Community Resources   Ms. Schelling was given information about Medicaid Managed Care Coordination team services today. [place] Patient agreed to services and verbal consent obtained.  Engaged with patient  for by telephone forfollow up visit in response to referral for case management and/or care coordination services.   Assessments/Interventions:  Review of past medical history, allergies, medications, health status, including review of consultants reports, laboratory and other test data, was performed as part of comprehensive evaluation and provision of chronic care management services.  SDOH: (Social Determinant of Health) assessments and interventions performed: [name]    Flowsheet Row Patient Outreach Telephone from 05/21/2022 in [place] Office Visit from 05/01/2022 in [place] Patient Outreach Telephone from 04/24/2022 in [place] Patient Outreach Telephone from 04/23/2022 in [place] Patient Outreach Telephone from 03/18/2022 in [place] Office Visit from 03/17/2022 in [place]  [name]        Food Insecurity Interventions -- -- -- Other (Comment)  [has food stamps and resources] -- --  Housing Interventions -- -- -- -- Intervention Not Indicated --  Transportation Interventions -- -- -- Patient Resources Tax adviser) -- --  Depression Interventions/Treatment  -- Medication, Currently on Treatment -- -- -- Medication, Currently on Treatment  Financial Strain Interventions  Other (Comment)  [referrals placed -BSW assisting] -- -- -- -- --  Physical Activity Interventions Other (Comments)  [not physically able to] -- -- -- -- --  Stress Interventions -- -- [employer], Provide Counseling -- -- --  Social Connections Interventions -- -- -- -- Intervention Not Indicated --     BSW completed a telephone outreach with patient. She stated she is doing well. They were able to go to a wholesale store in [place] and get some food. Patient stated she has some appointments coming up including the skin doctor but that is not until November. Patient stated she is on the list for a sooner appointment if someone cancels. BSW contacted other skin centers in the area and they do not accept patients insurance. No other resources are needed at this time.  Advanced Directives Status:  Not addressed in this encounter.  Care Plan                 Allergies  Allergen Reactions   Codeine Shortness Of Breath and Rash   Percocet [Oxycodone-Acetaminophen] Shortness Of Breath   Sulfa Antibiotics Shortness Of Breath and Rash   Aspirin Hives   Bee Venom     Bee stings   Darvon [Propoxyphene]     Darvocet-N 100   Latex    Oxycodone Nausea And Vomiting   Penicillins     Has patient had a PCN reaction causing immediate rash, facial/tongue/throat swelling, SOB or lightheadedness with hypotension: Unknown Has patient had a PCN reaction causing severe rash involving mucus membranes or skin necrosis: Unknown Has patient had a PCN reaction that required hospitalization: Unknown Has patient had a PCN reaction occurring within the last 10 years: Unknown If all of the above answers are "NO", then may proceed with Cephalosporin use.  Medications Reviewed Today     Reviewed by [name], RN (Registered Nurse) on 05/21/22 at 1245  Med List Status: <None>   Medication Order Taking? Sig Documenting Provider Last Dose Status Informant  ACCU-CHEK GUIDE test strip  [id number]  USE TO CHECK BLOOD SUGAR 3 TIMES DAILY AS DIRECTED [name], NP  Active   Accu-Chek Softclix Lancets lancets [id number] No CHECK BLOOD SUGAR TWICE DAILY [name], NP Taking Active   albuterol (PROVENTIL) (2.5 MG/3ML) 0.083% nebulizer solution [id number] No USE 1 VIAL VIA NEBULIZER EVERY 4 HOURS AS NEEDED [name], NP Taking Active   amLODipine (NORVASC) 5 MG tablet [id number]  TAKE 1 TABLET BY MOUTH ONCE DAILY [name], NP  Active   atorvastatin (LIPITOR) 80 MG tablet [id number] No TAKE 1 TABLET BY MOUTH ONCE EVERY [name], NP Taking Active   Blood Glucose Monitoring Suppl (ACCU-CHEK GUIDE) w/Device KIT [id number] No Use to check blood sugar 3 times a day. [name], NP Taking Active   Blood Pressure Monitoring (OMRON 3 SERIES BP MONITOR) DEVI [id number] No Use to check blood pressure as directed [name], NP Taking Active   citalopram (CELEXA) 40 MG tablet [id number] No TAKE 1 TABLET BY MOUTH ONCE DAILY [name], NP Taking Active   EPINEPHrine 0.3 mg/0.3 mL IJ SOAJ injection [id number] No INJECT 1 SYRINGE INTO OUTER THIGH ONCE AS NEEDED FOR SEVERE ALLERGIC REACTION. [name], NP Taking Active   Fluticasone-Umeclidin-Vilant (TRELEGY ELLIPTA) 100-62.5-25 MCG/ACT AEPB [id number] No Inhale 1 puff into the lungs daily. [name], MD Taking Active   Fluticasone-Umeclidin-Vilant [facility] ELLIPTA) 100-62.5-25 MCG/ACT AEPB [id number] No Inhale 1 puff into the lungs daily. [name], MD Taking Active   furosemide (LASIX) 20 MG tablet [id number] No TAKE 1 TABLET BY MOUTH ONCE DAILY IN AFTERNOON AS NEEDED LEG EDEMA [name], NP Taking Active   gabapentin (NEURONTIN) 300 MG capsule [id number] No TAKE 1 CAPSULE BY MOUTH 3 TIMES DAILY ASNEEDED (NEED OFFICE VISIT FOR FURTHER REFILLS) [name], NP Taking Active   hydrALAZINE (APRESOLINE) 50 MG tablet [id number] No Take 1 tablet (50 mg total) by  mouth 3 (three) times daily. [name], NP Taking Active   hydrOXYzine (VISTARIL) 25 MG capsule [id number]  Take 1 capsule (25 mg total) by mouth every 8 (eight) hours as needed. [name], NP  Active   levothyroxine (SYNTHROID) 50 MCG tablet [id number] No TAKE 1 TABLET BY MOUTH ONCE DAILY ON AN EMPTY STOMACH. WAIT 30 MINUTES BEFORE TAKING OTHER MEDS. [name], NP Taking Active   lisinopril (ZESTRIL) 40 MG tablet [id number] No TAKE 1 TABLET BY MOUTH ONCE DAILY [name], NP Taking Active   meloxicam (MOBIC) 15 MG tablet [id number] No TAKE 1 TABLET BY MOUTH ONCE DAILY AS NEEDED WITH FOOD OR MILK [name], NP Taking Active   montelukast (SINGULAIR) 10 MG tablet [id number] No TAKE 1 TABLET BY MOUTH AT BEDTIME [name], NP Taking Active   nitroGLYCERIN (NITROSTAT) 0.4 MG SL tablet [id number] No DISSOLVE 1 TABLET UNDER TONGUE AT ONSET OF CHEST PAIN. REPEAT IN 5 MIN IF NOT RESOLVED, MAX 3 DOSES. 911 IF NEEDED. [name], NP Taking Active   omeprazole (PRILOSEC) 20 MG capsule [id number] No TAKE 1 CAPSULE BY MOUTH ONCE DAILY [name], NP Taking Active   OXYGEN [id number] No Inhale 3 L into the lungs daily. [provider] Taking Active   spironolactone (ALDACTONE) 25 MG tablet [id number] No Take 0.5 tablets (12.5 mg total) by mouth daily.  [name], NP Taking Active   TRULICITY 4.49 QP/5.9FM [name] [id number]  INJECT 0.75MG  SUBQ ONCE A WEEK [name], NP  Active   Vitamin D, Ergocalciferol, (DRISDOL) 1.25 MG (50000 UNIT) CAPS capsule [id number]  TAKE 1 CAPSULE BY MOUTH EVERY 7 DAYS [name], NP  Active             Patient Active Problem List   Diagnosis Date Noted   Venous ulcer ([place]) 05/03/2022   Foot pain, right 09/01/2021   Fibrocystic disease of both breasts 05/07/2019   IFG (impaired fasting glucose) 05/03/2019   Chronic venous insufficiency 11/09/2018   Overactive bladder 09/30/2018   Peripheral  neuropathy 09/30/2018   Apnea 01/02/2016   Lymphedema 11/30/2014   Dyspnea on exertion 11/30/2014   Anxiety 11/29/2014   Arthritis 11/29/2014   COPD (chronic obstructive pulmonary disease) ([place]) 11/29/2014   Clinical depression 11/29/2014   H/O eating disorder 11/29/2014   Esophageal reflux 11/29/2014   Acne inversa 11/29/2014   Essential hypertension 11/29/2014   HLD (hyperlipidemia) 11/29/2014   Adult hypothyroidism 11/29/2014   Insomnia 11/29/2014   Low back pain 11/29/2014   Morbid obesity ([place]) 11/29/2014   Posttraumatic stress disorder 11/29/2014   Vitamin D deficiency 11/29/2014   Nicotine dependence, cigarettes, uncomplicated 03/00/9233    Conditions to be addressed/monitored per PCP order:   community resources  There are no care plans that you recently modified to display for this patient.   Follow up:  Patient agrees to Care Plan and Follow-up.  Plan: The Managed Medicaid care management team will reach out to the patient again over the next 06/30/22 days.  Date/time of next scheduled Social Work care management/care coordination outreach:  [place], [place], [place] Medicaid Team  [phone]

## 2022-06-02 ENCOUNTER — Ambulatory Visit: Payer: Medicaid Other | Admitting: Pulmonary Disease

## 2022-06-08 DIAGNOSIS — Z993 Dependence on wheelchair: Secondary | ICD-10-CM | POA: Diagnosis not present

## 2022-06-08 DIAGNOSIS — R3981 Functional urinary incontinence: Secondary | ICD-10-CM | POA: Diagnosis not present

## 2022-06-18 ENCOUNTER — Other Ambulatory Visit: Payer: Medicaid Other | Admitting: Obstetrics and Gynecology

## 2022-06-18 ENCOUNTER — Encounter: Payer: Self-pay | Admitting: Obstetrics and Gynecology

## 2022-06-18 NOTE — Patient Outreach (Signed)
Medicaid Managed Care   Nurse Care Manager Note  06/18/2022 Name:  Cheryl Chandler MRN:  [id number] DOB:  08/01/1965  Cheryl Chandler is an 57 y.o. year old female who is a primary patient of Cheryl Billings, NP.  The [facility] Managed Care Coordination team was consulted for assistance with:    Chronic healthcare management needs, OSA, HTN, DM, COPD, anxiety/depression, LBP, GERD, lymphedema, H/A, hypothyroid, arthtritis  [name] was given information about Medicaid Managed Care Coordination team services today. [place] Patient agreed to services and verbal consent obtained.  Engaged with patient by telephone for follow up visit in response to provider referral for case management and/or care coordination services.   Assessments/Interventions:  Review of past medical history, allergies, medications, health status, including review of consultants reports, laboratory and other test data, was performed as part of comprehensive evaluation and provision of chronic care management services.  SDOH (Social Determinants of Health) assessments and interventions performed: [name]    Flowsheet Row Patient Outreach Telephone from 06/18/2022 in [place] Patient Outreach Telephone from 05/21/2022 in [place] Office Visit from 05/01/2022 in [place] Patient Outreach Telephone from 04/24/2022 in [place] Patient Outreach Telephone from 04/23/2022 in [place] Patient Outreach Telephone from 03/18/2022 in [place]  [name]        Food Insecurity Interventions -- -- -- -- Other (Comment)  [has food stamps and resources] --  Housing Interventions Intervention Not Indicated -- -- -- -- Intervention Not Indicated  Transportation Interventions -- -- -- -- Patient Resources  Tax adviser) --  Utilities Interventions Intervention Not Indicated -- -- -- -- --  Depression Interventions/Treatment  -- -- Medication, Currently on Treatment -- -- --  Financial Strain Interventions -- Other (Comment)  [referrals placed -BSW assisting] -- -- -- --  Physical Activity Interventions -- Other (Comments)  [not physically able to] -- -- -- --  Stress Interventions -- -- -- [employer], Provide Counseling -- --  Social Connections Interventions -- -- -- -- -- Intervention Not Indicated     Care Plan  Allergies  Allergen Reactions   Codeine Shortness Of Breath and Rash   Percocet [Oxycodone-Acetaminophen] Shortness Of Breath   Sulfa Antibiotics Shortness Of Breath and Rash   Aspirin Hives   Bee Venom     Bee stings   Darvon [Propoxyphene]     Darvocet-N 100   Latex    Oxycodone Nausea And Vomiting   Penicillins     Has patient had a PCN reaction causing immediate rash, facial/tongue/throat swelling, SOB or lightheadedness with hypotension: Unknown Has patient had a PCN reaction causing severe rash involving mucus membranes or skin necrosis: Unknown Has patient had a PCN reaction that required hospitalization: Unknown Has patient had a PCN reaction occurring within the last 10 years: Unknown If all of the above answers are "NO", then may proceed with Cephalosporin use.   Medications Reviewed Today     Reviewed by [name], RN (Registered Nurse) on 06/18/22 at 1425  Med List Status: <None>   Medication Order Taking? Sig Documenting Provider Last Dose Status Informant  ACCU-CHEK GUIDE test strip [id number]  USE TO CHECK BLOOD SUGAR 3 TIMES DAILY AS DIRECTED Cheryl Billings, NP  Active   Accu-Chek Softclix Lancets lancets [id number]  CHECK BLOOD SUGAR TWICE DAILY Cheryl Billings, NP  Active   albuterol (PROVENTIL) (2.5  MG/3ML) 0.083% nebulizer solution [id number]  USE 1 VIAL VIA NEBULIZER EVERY 4 HOURS AS NEEDED Cheryl Billings, NP   Active   amLODipine (NORVASC) 5 MG tablet [id number]  TAKE 1 TABLET BY MOUTH ONCE DAILY Cheryl Billings, NP  Active   atorvastatin (LIPITOR) 80 MG tablet [id number]  TAKE 1 TABLET BY MOUTH ONCE EVERY [name], NP  Active   Blood Glucose Monitoring Suppl (ACCU-CHEK GUIDE) w/Device KIT [id number]  Use to check blood sugar 3 times a day. [name], NP  Active   Blood Pressure Monitoring (OMRON 3 SERIES BP MONITOR) DEVI [id number]  Use to check blood pressure as directed Cheryl Billings, NP  Active   citalopram (CELEXA) 40 MG tablet [id number]  TAKE 1 TABLET BY MOUTH ONCE DAILY Cheryl Billings, NP  Active   diphenhydrAMINE (BENADRYL) 25 mg capsule [id number] Yes Take 25 mg by mouth every 6 (six) hours as needed for itching. Taking once a day [provider]  Active Self  EPINEPHrine 0.3 mg/0.3 mL IJ SOAJ injection [id number]  INJECT 1 SYRINGE INTO OUTER THIGH ONCE AS NEEDED FOR SEVERE ALLERGIC REACTION. Cheryl Billings, NP  Active   Fluticasone-Umeclidin-Vilant (TRELEGY ELLIPTA) 100-62.5-25 MCG/ACT AEPB [id number]  Inhale 1 puff into the lungs daily. [name], MD  Active   Fluticasone-Umeclidin-Vilant [facility] ELLIPTA) 100-62.5-25 MCG/ACT AEPB [id number]  Inhale 1 puff into the lungs daily. [name], MD  Active   furosemide (LASIX) 20 MG tablet [id number]  TAKE 1 TABLET BY MOUTH ONCE DAILY IN AFTERNOON AS NEEDED LEG EDEMA Cheryl Billings, NP  Active   gabapentin (NEURONTIN) 300 MG capsule [id number]  TAKE 1 CAPSULE BY MOUTH 3 TIMES DAILY ASNEEDED (NEED OFFICE VISIT FOR FURTHER REFILLS) Cheryl Billings, NP  Active   hydrALAZINE (APRESOLINE) 50 MG tablet [id number]  Take 1 tablet (50 mg total) by mouth 3 (three) times daily. Cheryl Billings, NP  Active   hydrOXYzine (VISTARIL) 25 MG capsule [id number]  Take 1 capsule (25 mg total) by mouth every 8 (eight) hours as needed. Cheryl Billings, NP  Active   levothyroxine (SYNTHROID) 50 MCG tablet [id number]   TAKE 1 TABLET BY MOUTH ONCE DAILY ON AN EMPTY STOMACH. WAIT 30 MINUTES BEFORE TAKING OTHER MEDS. Cheryl Billings, NP  Active   lisinopril (ZESTRIL) 40 MG tablet [id number]  TAKE 1 TABLET BY MOUTH ONCE DAILY Cheryl Billings, NP  Active   meloxicam (MOBIC) 15 MG tablet [id number]  TAKE 1 TABLET BY MOUTH ONCE DAILY AS NEEDED WITH FOOD OR MILK Cheryl Billings, NP  Active   montelukast (SINGULAIR) 10 MG tablet [id number]  TAKE 1 TABLET BY MOUTH AT BEDTIME Cheryl Billings, NP  Active   nitroGLYCERIN (NITROSTAT) 0.4 MG SL tablet [id number]  DISSOLVE 1 TABLET UNDER TONGUE AT ONSET OF CHEST PAIN. REPEAT IN 5 MIN IF NOT RESOLVED, MAX 3 DOSES. 911 IF NEEDED. Cheryl Billings, NP  Active   omeprazole (PRILOSEC) 20 MG capsule [id number]  TAKE 1 CAPSULE BY MOUTH ONCE DAILY Cheryl Billings, NP  Active   OXYGEN [id number]  Inhale 3 L into the lungs daily. [provider]  Active   spironolactone (ALDACTONE) 25 MG tablet [id number]  Take 0.5 tablets (12.5 mg total) by mouth daily. Cheryl Billings, NP  Active   TRULICITY [id number] [name] [id number]  INJECT 0.75MG SUBQ ONCE A [name], NP  Active   Vitamin D, Ergocalciferol, (DRISDOL) 1.25 MG (50000 UNIT) CAPS capsule [id number]  TAKE 1 CAPSULE BY MOUTH EVERY 7 DAYS Cheryl Billings, NP  Active            Patient Active Problem List   Diagnosis Date Noted   Venous ulcer ([place]) 05/03/2022   Foot pain, right 09/01/2021   Fibrocystic disease of both breasts 05/07/2019   IFG (impaired fasting glucose) 05/03/2019   Chronic venous insufficiency 11/09/2018   Overactive bladder 09/30/2018   Peripheral neuropathy 09/30/2018   Apnea 01/02/2016   Lymphedema 11/30/2014   Dyspnea on exertion 11/30/2014   Anxiety 11/29/2014   Arthritis 11/29/2014   COPD (chronic obstructive pulmonary disease) ([place]) 11/29/2014   Clinical depression 11/29/2014   H/O eating disorder 11/29/2014   Esophageal reflux 11/29/2014   Acne inversa 11/29/2014    Essential hypertension 11/29/2014   HLD (hyperlipidemia) 11/29/2014   Adult hypothyroidism 11/29/2014   Insomnia 11/29/2014   Low back pain 11/29/2014   Morbid obesity ([place]) 11/29/2014   Posttraumatic stress disorder 11/29/2014   Vitamin D deficiency 11/29/2014   Nicotine dependence, cigarettes, uncomplicated 0000000   Conditions to be addressed/monitored per PCP order:  Chronic healthcare management needs, OSA, HTN, DM, COPD, anxiety/depression, LBP, GERD, lymphedema, H/A, hypothyroid, arthritis, HLD  Care Plan : RNCM: COPD (Adult)  Updates made by [name], RN since 06/18/2022 12:00 AM     Problem: RNCM: Psychological Adjustment to Diagnosis (COPD)   Priority: Medium  Onset Date: 05/22/2020     Long-Range Goal: RNCM: Adjustment to Disease Achieved   Start Date: 05/22/2020  Expected End Date: 07/23/2022  Recent Progress: On track  Priority: High  Note:   Current Barriers:  Knowledge deficits related to basic understanding of COPD disease process Knowledge deficits related to basic COPD self care/management Knowledge deficit related to basic understanding of how to use inhalers and how inhaled medications work Knowledge deficit related to importance of energy conservation Limited Social Support Unable to independently manage COPD Lacks social connections Does not contact provider office for questions/concerns  06/18/22:   Patient on 3L of Oxygen.  PFT 2/27.  To get portable tank?  Case Manager Clinical Goal(s): patient will report using inhalers as prescribed including rinsing mouth after use patient will be able to verbalize understanding of COPD action plan and when to seek appropriate levels of medical care  patient will engage in lite exercise as tolerated to build/regain stamina and strength and reduce shortness of breath through activity tolerance  patient will verbalize basic understanding of COPD disease process and self care activities  Interventions:   Inter-disciplinary care team collaboration (see longitudinal plan of care) UNABLE to independently: manage COPD Provided patient with basic written and verbal COPD education on self care/management/and exacerbation prevention Provided patient with COPD action plan and reinforced importance of daily self assessment.  Provided written and verbal instructions on pursed lip breathing and utilized returned demonstration as teach back Provided instruction about proper use of medications used for management of COPD including inhalers Advised patient to self assesses COPD action plan zone and make appointment with provider if in the yellow zone for 48 hours without improvement. Provided patient with education about the role of exercise in the management of COPD Advised patient to engage in light exercise as tolerated 3-5 days a week Provided education about and advised patient to utilize infection prevention strategies to reduce risk of respiratory infection. Patient Goals/Self-Care Activities:  - decision-making supported - depression screen reviewed - emotional support provided - family involvement promoted - problem-solving facilitated - relaxation techniques promoted - verbalization of feelings encouraged Follow Up Plan: Telephone follow up appointment with  care management team member scheduled.   Care Plan : RNCM: Hypertension (Adult)  Updates made by [name], RN since 06/18/2022 12:00 AM     Problem: RNCM: Hypertension (Hypertension)   Priority: Medium  Onset Date: 05/22/2020     Long-Range Goal: RNCM: Hypertension Monitored   Start Date: 05/22/2020  Expected End Date: 07/23/2022  Recent Progress: On track  Priority: Medium  Note:    Current Barriers:  Knowledge Deficits related to basic understanding of hypertension pathophysiology and self care management Knowledge Deficits related to understanding of medications prescribed for management of hypertension Limited Social  Support Unable to independently manage HTN Lacks social connections Does not contact provider office for questions/concern 06/18/22:  BP 165/50 Case Manager Clinical Goal(s):   patient will verbalize understanding of plan for hypertension management patient will demonstrate improved adherence to prescribed treatment plan for hypertension as evidenced by taking all medications as prescribed, monitoring and recording blood pressure as directed, adhering to low sodium/DASH diet patient will demonstrate improved health management independence as evidenced by checking blood pressure as directed and notifying PCP if SBP>160 or DBP > 90, taking all medications as prescribe, and adhering to a low sodium diet as discussed. Interventions:  Inter-disciplinary care team collaboration (see longitudinal plan of care) Evaluation of current treatment plan related to hypertension self management and patient's adherence to plan as established by provider.  Provided education to patient re: stroke prevention, s/s of heart attack and stroke, DASH diet, complications of uncontrolled blood pressure Reviewed medications with patient and discussed importance of compliance.  Discussed plans with patient for ongoing care management follow up and provided patient with direct contact information for care management team Advised patient, providing education and rationale, to monitor blood pressure daily and record, calling PCP for findings outside established parameters.  Patient Goals/Self-Care Activities Over the next 120 days, patient will:  - Self administers medications as prescribed Attends all scheduled provider appointments Calls provider office for new concerns, questions, or BP outside discussed parameters Checks BP and records as discussed Follows a low sodium diet/DASH diet - blood pressure trends reviewed - depression screen reviewed - home or ambulatory blood pressure monitoring encouraged Follow Up Plan:  Telephone follow up appointment with care management team member scheduled.     Care Plan : RNCM: Depression (Adult)  Updates made by [name], RN since 06/18/2022 12:00 AM     Problem: RNCM: Symptoms (Depression)   Priority: High  Onset Date: 12/02/2020     Long-Range Goal: RNCM: Symptoms Monitored and Managed   Start Date: 12/02/2020  Expected End Date: 05/21/2023  Recent Progress: Not on track  Priority: High  Note:   Current Barriers:  Ineffective Self Health Maintenance in a patient with Anxiety and Depression Unable to independently manage depression and anxiety as evidence of multiple chronic conditions and the patient feeling her health is out of control  Does not attend all scheduled provider appointments Lacks social connections Unable to perform IADLs independently Does not contact provider office for questions/concerns 06/18/22:  No complaints today-BSW and LCSW following Clinical Goal(s):  Collaboration with Cheryl Billings, NP regarding development and update of comprehensive plan of care as evidenced by provider attestation and co-signature Inter-disciplinary care team collaboration (see longitudinal plan of care) patient will work with care management team to address care coordination and chronic disease management needs related to Disease Management Educational Needs Care Coordination Mental Health Counseling Level of Care Concerns   Interventions:  Evaluation of current treatment  plan related to Anxiety and Depression, Financial constraints related to insurance not approving care for the patient to go to wound care , Limited social support, Level of care concerns, ADL IADL limitations, Mental Health Concerns , Family and relationship dysfunction, Substance abuse issues -   , and Inability to perform IADL's independently self-management and patient's adherence to plan as established by provider. Inter-disciplinary care team collaboration (see longitudinal plan of  care) Discussed plans with patient for ongoing care management follow up and provided patient with direct contact information for care management team Evaluation of depression and anxiety.  LCSW referral for increased stress-completed Collaborated with LCSW  Self Care Activities:  Patient verbalizes understanding of plan to effectively manage depression and anxiety Self administers medications as prescribed Attends all scheduled provider appointments Calls pharmacy for medication refills Attends church or other social activities Performs ADL's independently Performs IADL's independently Calls provider office for new concerns or questions Work with the [facility] team to meet needs for continued CCM services  Patient Goals: - activity or exercise based on tolerance encouraged - depression screen reviewed - emotional support provided - healthy lifestyle promoted - medication side effects monitored and managed - pain managed - participation in mental health treatment encouraged - quality of sleep assessed - response to mental health treatment monitored - response to pharmacologic therapy monitored - sleep hygiene techniques encouraged - social activities and relationships encouraged - substance use assessed Follow Up Plan: The care management team will reach out to the patient again over the next 30 business days.   Follow Up:  Patient agrees to Care Plan and Follow-up.  Plan: The Managed Medicaid care management team will reach out to the patient again over the next 30 business  days. and The  Patient has been provided with contact information for the Managed Medicaid care management team and has been advised to call with any health related questions or concerns.  Date/time of next scheduled RN care management/care coordination outreach:  07/20/22 at 0900

## 2022-06-18 NOTE — Patient Instructions (Signed)
Visit Information  Ms. Vandevander was given information about Medicaid Managed Care team care coordination services as a part of their [facility] Medicaid benefit. Kathie Cyndal Donatelli verbally consented to engagement with the [facility] Managed Care team.   If you are experiencing a medical emergency, please call 911 or report to your local emergency department or urgent care.   If you have a non-emergency medical problem during routine business hours, please contact your provider's office and ask to speak with a nurse.   For questions related to your [facility] health plan, please call: [phone] or go here:[email or web address]  If you would like to schedule transportation through your [facility] plan, please call the following number at least 2 days in advance of your appointment: [phone].  You can also use the MTM portal or MTM mobile app to manage your rides. For the portal, please go to [email or web address].  Call the [place] at [phone], at any time, 24 hours a day, 7 days a week. If you are in danger or need immediate medical attention call 911.  If you would like help to quit smoking, call 1-800-QUIT-NOW 810-827-5890) OR Espaol: 1-855-Djelo-Ya HD:1601594) o para ms informacin haga clic aqu or Text READY to 200-400 to register via text  Ms. Leveque - following are the goals we discussed in your visit today:   Goals Addressed             This Visit's Progress    RNCM: Keep Skin Clean and Dry       Follow Up Date: 07/20/22  - clean and dry skin well - keep skin dry - use a fragrance-free lotion on skin - wear a protective pad or garment    Why is this important?   Leaking urine (pee) can cause soreness from skin rashes and redness.  This is caused by skin being exposed to urine (pee).   07/20/22:  taking Benadryl everyday, using antibiotic cream everyday     RNCM: Track and Manage My Symptoms-Depression        Timeframe:  Long-Range Goal Priority:  High Start Date:    12-02-2020                         Expected End Date:   ongoing                Follow Up Date: 07/20/22   - avoid negative self-talk - develop a personal safety plan - develop a plan to deal with triggers like holidays, anniversaries - exercise at least 2 to 3 times per week - have a plan for how to handle bad days - journal feelings and what helps to feel better or worse - spend time or talk with others at least 2 to 3 times per week - spend time or talk with others every day - watch for early signs of feeling worse    Why is this important?   Keeping track of your progress will help your treatment team find the right mix of medicine and therapy for you.  Write in your journal every day.  Day-to-day changes in depression symptoms are normal. It may be more helpful to check your progress at the end of each week instead of every day.    06/18/22:  LCSW and BSW following     RNCM: Track and Manage My Triggers-COPD       Timeframe:  Long-Range Goal Priority:  Medium Start Date:  05-22-2020                        Expected End Date:   ongoing          Follow Up Date: 07/20/22   - avoid second hand smoke - eliminate smoking in my home - identify and avoid work-related triggers - identify and remove indoor air pollutants - limit outdoor activity during cold weather - listen for public air quality announcements every day    Why is this important?   Triggers are activities or things, like tobacco smoke or cold weather, that make your COPD (chronic obstructive pulmonary disease) flare-up.  Knowing these triggers helps you plan how to stay away from them.  When you cannot remove them, you can learn how to manage them.   06/18/22:  Oxygen 2L, PFT 2/27   Patient verbalizes understanding of instructions and care plan provided today and agrees to view in [place]. Active MyChart status and patient understanding of how to access  instructions and care plan via MyChart confirmed with patient.     The Managed Medicaid care management team will reach out to the patient again over the next 30 business  days.  The  Patient  has been provided with contact information for the Managed Medicaid care management team and has been advised to call with any health related questions or concerns.   Aida Raider RN, BSN [place] Management Coordinator - Managed Medicaid High Risk [phone]   Following is a copy of your plan of care:  Care Plan : RNCM: COPD (Adult)  Updates made by [name], RN since 06/18/2022 12:00 AM     Problem: RNCM: Psychological Adjustment to Diagnosis (COPD)   Priority: Medium  Onset Date: 05/22/2020     Long-Range Goal: RNCM: Adjustment to Disease Achieved   Start Date: 05/22/2020  Expected End Date: 07/23/2022  Recent Progress: On track  Priority: High  Note:   Current Barriers:  Knowledge deficits related to basic understanding of COPD disease process Knowledge deficits related to basic COPD self care/management Knowledge deficit related to basic understanding of how to use inhalers and how inhaled medications work Knowledge deficit related to importance of energy conservation Limited Social Support Unable to independently manage COPD Lacks social connections Does not contact provider office for questions/concerns  06/18/22:   Patient on 3L of Oxygen.  PFT 2/27.  To get portable tank?  Case Manager Clinical Goal(s): patient will report using inhalers as prescribed including rinsing mouth after use patient will be able to verbalize understanding of COPD action plan and when to seek appropriate levels of medical care  patient will engage in lite exercise as tolerated to build/regain stamina and strength and reduce shortness of breath through activity tolerance  patient will verbalize basic understanding of COPD disease process and self care activities   Interventions:  Inter-disciplinary care team collaboration (see longitudinal plan of care) UNABLE to independently: manage COPD Provided patient with basic written and verbal COPD education on self care/management/and exacerbation prevention Provided patient with COPD action plan and reinforced importance of daily self assessment.  Provided written and verbal instructions on pursed lip breathing and utilized returned demonstration as teach back Provided instruction about proper use of medications used for management of COPD including inhalers Advised patient to self assesses COPD action plan zone and make appointment with provider if in the yellow zone for 48 hours without improvement. Provided patient with  education about the role of exercise in the management of COPD Advised patient to engage in light exercise as tolerated 3-5 days a week Provided education about and advised patient to utilize infection prevention strategies to reduce risk of respiratory infection. Patient Goals/Self-Care Activities:  - decision-making supported - depression screen reviewed - emotional support provided - family involvement promoted - problem-solving facilitated - relaxation techniques promoted - verbalization of feelings encouraged Follow Up Plan: Telephone follow up appointment with care management team member scheduled.   Care Plan : RNCM: Hypertension (Adult)  Updates made by [name], RN since 06/18/2022 12:00 AM     Problem: RNCM: Hypertension (Hypertension)   Priority: Medium  Onset Date: 05/22/2020     Long-Range Goal: RNCM: Hypertension Monitored   Start Date: 05/22/2020  Expected End Date: 07/23/2022  Recent Progress: On track  Priority: Medium  Note:    Current Barriers:  Knowledge Deficits related to basic understanding of hypertension pathophysiology and self care management Knowledge Deficits related to understanding of medications prescribed for management of  hypertension Limited Social Support Unable to independently manage HTN Lacks social connections Does not contact provider office for questions/concern 06/18/22:  BP 165/50 Case Manager Clinical Goal(s):   patient will verbalize understanding of plan for hypertension management patient will demonstrate improved adherence to prescribed treatment plan for hypertension as evidenced by taking all medications as prescribed, monitoring and recording blood pressure as directed, adhering to low sodium/DASH diet patient will demonstrate improved health management independence as evidenced by checking blood pressure as directed and notifying PCP if SBP>160 or DBP > 90, taking all medications as prescribe, and adhering to a low sodium diet as discussed. Interventions:  Inter-disciplinary care team collaboration (see longitudinal plan of care) Evaluation of current treatment plan related to hypertension self management and patient's adherence to plan as established by provider.  Provided education to patient re: stroke prevention, s/s of heart attack and stroke, DASH diet, complications of uncontrolled blood pressure Reviewed medications with patient and discussed importance of compliance.  Discussed plans with patient for ongoing care management follow up and provided patient with direct contact information for care management team Advised patient, providing education and rationale, to monitor blood pressure daily and record, calling PCP for findings outside established parameters.  Patient Goals/Self-Care Activities Over the next 120 days, patient will:  - Self administers medications as prescribed Attends all scheduled provider appointments Calls provider office for new concerns, questions, or BP outside discussed parameters Checks BP and records as discussed Follows a low sodium diet/DASH diet - blood pressure trends reviewed - depression screen reviewed - home or ambulatory blood pressure monitoring  encouraged Follow Up Plan: Telephone follow up appointment with care management team member scheduled.     Care Plan : RNCM: Depression (Adult)  Updates made by [name], RN since 06/18/2022 12:00 AM     Problem: RNCM: Symptoms (Depression)   Priority: High  Onset Date: 12/02/2020     Long-Range Goal: RNCM: Symptoms Monitored and Managed   Start Date: 12/02/2020  Expected End Date: 05/21/2023  Recent Progress: Not on track  Priority: High  Note:   Current Barriers:  Ineffective Self Health Maintenance in a patient with Anxiety and Depression Unable to independently manage depression and anxiety as evidence of multiple chronic conditions and the patient feeling her health is out of control  Does not attend all scheduled provider appointments Lacks social connections Unable to perform IADLs independently Does not contact provider office for questions/concerns  06/18/22:  No complaints today-BSW and LCSW following Clinical Goal(s):  Collaboration with [name], NP regarding development and update of comprehensive plan of care as evidenced by provider attestation and co-signature Inter-disciplinary care team collaboration (see longitudinal plan of care) patient will work with care management team to address care coordination and chronic disease management needs related to Disease Management Educational Needs Care Coordination Mental Health Counseling Level of Care Concerns   Interventions:  Evaluation of current treatment plan related to Anxiety and Depression, Financial constraints related to insurance not approving care for the patient to go to wound care , Limited social support, Level of care concerns, ADL IADL limitations, Mental Health Concerns , Family and relationship dysfunction, Substance abuse issues -   , and Inability to perform IADL's independently self-management and patient's adherence to plan as established by provider. Inter-disciplinary care team  collaboration (see longitudinal plan of care) Discussed plans with patient for ongoing care management follow up and provided patient with direct contact information for care management team Evaluation of depression and anxiety.  LCSW referral for increased stress-completed Collaborated with LCSW  Self Care Activities:  Patient verbalizes understanding of plan to effectively manage depression and anxiety Self administers medications as prescribed Attends all scheduled provider appointments Calls pharmacy for medication refills Attends church or other social activities Performs ADL's independently Performs IADL's independently Calls provider office for new concerns or questions Work with the [facility] team to meet needs for continued CCM services  Patient Goals: - activity or exercise based on tolerance encouraged - depression screen reviewed - emotional support provided - healthy lifestyle promoted - medication side effects monitored and managed - pain managed - participation in mental health treatment encouraged - quality of sleep assessed - response to mental health treatment monitored - response to pharmacologic therapy monitored - sleep hygiene techniques encouraged - social activities and relationships encouraged - substance use assessed Follow Up Plan: The care management team will reach out to the patient again over the next 30 business days.

## 2022-06-23 ENCOUNTER — Ambulatory Visit: Payer: Medicaid Other | Attending: Pulmonary Disease

## 2022-06-23 DIAGNOSIS — R0602 Shortness of breath: Secondary | ICD-10-CM

## 2022-06-23 DIAGNOSIS — F17210 Nicotine dependence, cigarettes, uncomplicated: Secondary | ICD-10-CM | POA: Diagnosis not present

## 2022-06-23 MED ORDER — ALBUTEROL SULFATE (2.5 MG/3ML) 0.083% IN NEBU
2.5000 mg | INHALATION_SOLUTION | Freq: Once | RESPIRATORY_TRACT | Status: AC
  Administered 2022-06-23: 2.5 mg via RESPIRATORY_TRACT

## 2022-06-25 ENCOUNTER — Other Ambulatory Visit: Payer: Medicaid Other | Admitting: Licensed Clinical Social Worker

## 2022-06-25 DIAGNOSIS — G4736 Sleep related hypoventilation in conditions classified elsewhere: Secondary | ICD-10-CM | POA: Diagnosis not present

## 2022-06-25 NOTE — Patient Outreach (Signed)
Medicaid Managed Care Social Work Note  06/25/2022 Name:  Cheryl Chandler MRN:  [id number] DOB:  17-May-1965  Cheryl Chandler is an 57 y.o. year old female who is a primary patient of [name], NP.  The Medicaid Managed Care Coordination team was consulted for assistance with:  [place] and Resources  [name] was given information about Medicaid Managed Care Coordination team services today. [place] Patient agreed to services and verbal consent obtained.  Engaged with patient  for by telephone forfollow up visit in response to referral for case management and/or care coordination services.   Assessments/Interventions:  Review of past medical history, allergies, medications, health status, including review of consultants reports, laboratory and other test data, was performed as part of comprehensive evaluation and provision of chronic care management services.  SDOH: (Social Determinant of Health) assessments and interventions performed: [name]    Flowsheet Row Patient Outreach Telephone from 06/25/2022 in [place] Patient Outreach Telephone from 06/18/2022 in [place] Patient Outreach Telephone from 05/21/2022 in [place] Office Visit from 05/01/2022 in [place] Patient Outreach Telephone from 04/24/2022 in [place] Patient Outreach Telephone from 04/23/2022 in [place]  [name]        Food Insecurity Interventions -- -- -- -- -- Other (Comment)  [has food stamps and resources]  Housing Interventions -- Intervention Not Indicated -- -- -- --  Transportation Interventions -- -- -- -- -- Patient Resources Tax adviser)  Utilities Interventions -- Intervention Not Indicated -- -- -- --  Depression Interventions/Treatment  -- -- --  Medication, Currently on Treatment -- --  Financial Strain Interventions -- -- Other (Comment)  [referrals placed -BSW assisting] -- -- --  Physical Activity Interventions -- -- Other (Comments)  [not physically able to] -- -- --  Stress Interventions Intervention Not Indicated -- -- -- [employer], Provide Counseling --       Advanced Directives Status:  See Care Plan for related entries.  Care Plan                 Allergies  Allergen Reactions   Codeine Shortness Of Breath and Rash   Percocet [Oxycodone-Acetaminophen] Shortness Of Breath   Sulfa Antibiotics Shortness Of Breath and Rash   Aspirin Hives   Bee Venom     Bee stings   Darvon [Propoxyphene]     Darvocet-N 100   Latex    Oxycodone Nausea And Vomiting   Penicillins     Has patient had a PCN reaction causing immediate rash, facial/tongue/throat swelling, SOB or lightheadedness with hypotension: Unknown Has patient had a PCN reaction causing severe rash involving mucus membranes or skin necrosis: Unknown Has patient had a PCN reaction that required hospitalization: Unknown Has patient had a PCN reaction occurring within the last 10 years: Unknown If all of the above answers are "NO", then may proceed with Cephalosporin use.    Medications Reviewed Today     Reviewed by [name], RN (Registered Nurse) on 06/18/22 at 1425  Med List Status: <None>   Medication Order Taking? Sig Documenting Provider Last Dose Status Informant  ACCU-CHEK GUIDE test strip [id number]  USE TO CHECK BLOOD SUGAR 3 TIMES DAILY AS DIRECTED [name], NP  Active   Accu-Chek Softclix Lancets lancets [id number]  CHECK BLOOD SUGAR TWICE DAILY [name], NP  Active   albuterol (  PROVENTIL) (2.5 MG/3ML) 0.083% nebulizer solution [id number]  USE 1 VIAL VIA NEBULIZER EVERY 4 HOURS AS NEEDED [name], NP  Active   amLODipine (NORVASC) 5 MG tablet [id number]  TAKE 1 TABLET BY MOUTH ONCE DAILY [name], NP  Active   atorvastatin (LIPITOR) 80 MG tablet [id number]  TAKE 1 TABLET BY MOUTH ONCE EVERY [name], NP  Active   Blood Glucose Monitoring Suppl (ACCU-CHEK GUIDE) w/Device KIT [id number]  Use to check blood sugar 3 times a day. [name], NP  Active   Blood Pressure Monitoring (OMRON 3 SERIES BP MONITOR) DEVI [id number]  Use to check blood pressure as directed [name], NP  Active   citalopram (CELEXA) 40 MG tablet [id number]  TAKE 1 TABLET BY MOUTH ONCE DAILY [name], NP  Active   diphenhydrAMINE (BENADRYL) 25 mg capsule [id number] Yes Take 25 mg by mouth every 6 (six) hours as needed for itching. Taking once a day [provider]  Active Self  EPINEPHrine 0.3 mg/0.3 mL IJ SOAJ injection [id number]  INJECT 1 SYRINGE INTO OUTER THIGH ONCE AS NEEDED FOR SEVERE ALLERGIC REACTION. [name], NP  Active   Fluticasone-Umeclidin-Vilant (TRELEGY ELLIPTA) 100-62.5-25 MCG/ACT AEPB [id number]  Inhale 1 puff into the lungs daily. [name], MD  Active   Fluticasone-Umeclidin-Vilant [facility] ELLIPTA) 100-62.5-25 MCG/ACT AEPB [id number]  Inhale 1 puff into the lungs daily. [name], MD  Active   furosemide (LASIX) 20 MG tablet [id number]  TAKE 1 TABLET BY MOUTH ONCE DAILY IN AFTERNOON AS NEEDED LEG EDEMA [name], NP  Active   gabapentin (NEURONTIN) 300 MG capsule [id number]  TAKE 1 CAPSULE BY MOUTH 3 TIMES DAILY ASNEEDED (NEED OFFICE VISIT FOR FURTHER REFILLS) [name], NP  Active   hydrALAZINE (APRESOLINE) 50 MG tablet [id number]  Take 1 tablet (50 mg total) by mouth 3 (three) times daily. [name], NP  Active   hydrOXYzine (VISTARIL) 25 MG capsule [id number]  Take 1 capsule (25 mg total) by mouth every 8 (eight) hours as needed. [name], NP  Active   levothyroxine (SYNTHROID) 50 MCG tablet [id number]  TAKE 1 TABLET BY MOUTH ONCE DAILY ON AN EMPTY STOMACH. WAIT 30 MINUTES BEFORE TAKING OTHER MEDS.  [name], NP  Active   lisinopril (ZESTRIL) 40 MG tablet [id number]  TAKE 1 TABLET BY MOUTH ONCE DAILY [name], NP  Active   meloxicam (MOBIC) 15 MG tablet [id number]  TAKE 1 TABLET BY MOUTH ONCE DAILY AS NEEDED WITH FOOD OR MILK [name], NP  Active   montelukast (SINGULAIR) 10 MG tablet [id number]  TAKE 1 TABLET BY MOUTH AT BEDTIME [name], NP  Active   nitroGLYCERIN (NITROSTAT) 0.4 MG SL tablet [id number]  DISSOLVE 1 TABLET UNDER TONGUE AT ONSET OF CHEST PAIN. REPEAT IN 5 MIN IF NOT RESOLVED, MAX 3 DOSES. 911 IF NEEDED. [name], NP  Active   omeprazole (PRILOSEC) 20 MG capsule [id number]  TAKE 1 CAPSULE BY MOUTH ONCE DAILY [name], NP  Active   OXYGEN [id number]  Inhale 3 L into the lungs daily. [provider]  Active   spironolactone (ALDACTONE) 25 MG tablet [id number]  Take 0.5 tablets (12.5 mg total) by mouth daily. [name], NP  Active   TRULICITY [id number] [name] [id number]  INJECT 0.'75MG'$  SUBQ ONCE A WEEK [name], NP  Active   Vitamin D, Ergocalciferol, (DRISDOL) 1.25 MG (50000 UNIT) CAPS capsule [id number]  TAKE 1 CAPSULE BY MOUTH EVERY 7 DAYS [name],  NP  Active             Patient Active Problem List   Diagnosis Date Noted   Venous ulcer ([place]) 05/03/2022   Foot pain, right 09/01/2021   Fibrocystic disease of both breasts 05/07/2019   IFG (impaired fasting glucose) 05/03/2019   Chronic venous insufficiency 11/09/2018   Overactive bladder 09/30/2018   Peripheral neuropathy 09/30/2018   Apnea 01/02/2016   Lymphedema 11/30/2014   Dyspnea on exertion 11/30/2014   Anxiety 11/29/2014   Arthritis 11/29/2014   COPD (chronic obstructive pulmonary disease) ([place]) 11/29/2014   Clinical depression 11/29/2014   H/O eating disorder 11/29/2014   Esophageal reflux 11/29/2014   Acne inversa 11/29/2014   Essential hypertension 11/29/2014   HLD (hyperlipidemia) 11/29/2014   Adult hypothyroidism  11/29/2014   Insomnia 11/29/2014   Low back pain 11/29/2014   Morbid obesity ([place]) 11/29/2014   Posttraumatic stress disorder 11/29/2014   Vitamin D deficiency 11/29/2014   Nicotine dependence, cigarettes, uncomplicated 0000000    Conditions to be addressed/monitored per PCP order:   Stress Management  Care Plan : General Social Work (Adult)  Updates made by [name], LCSW since 06/25/2022 12:00 AM     Problem: Coping Skills (General Plan of Care)      Long-Range Goal: Coping Skills Enhanced   Start Date: 11/07/2020  Expected End Date: 03/07/2022  Recent Progress: On track  Priority: High  Note:   Timeframe:  Long-Range Goal Priority:  High Start Date:       08/26/21                    Expected End Date:  ongoing   Current Barriers:  Financial constraints Mental Health Concerns  Social Isolation Limited education about mental health support resources that are available to her within the local area* Cognitive Deficits Lacks knowledge of community resource   Clinical Social Work Clinical Goal(s):  Over the next 90 days, client will work with SW to address concerns related to experiencing ongoing symptoms of depression, sadness and grief since the passing of her mother Over the next 90 days, patient will work with LCSW to address needs related to implementing appropriate self-care and depression management.    Interventions: Patient interviewed and appropriate assessments performed Provided mental health counseling with regards to patient's loss and ongoing stressors. Patient was very close with her mother who passed away a few years ago and she continues to experience symptoms of grief from this loss. Patient denies wanting LCSW to make a referral for grief counseling through [place] again but was receptive to coping skill and resource education/information that was provided during session today. Patient reports decorating for the holidays helps her to connect with  her mother as they did this together when she was alive.  Provided patient with information about managing depression within her daily life. Patient has ongoing grief symptoms as well. Patient states "I feel worn down." Patient reports that she talks out loud to her mother's picture and this is very helpful to her. Grief support resource education provided again during session today.  Provided reflective listening and implemented appropriate interventions to help support patient and her emotional needs  Discussed plans with patient for ongoing care management follow up and provided patient with direct contact information for [facility] team Advised patient to contact [facility] team with any urgent case management needs. Assisted patient/caregiver with obtaining information about health plan benefits LCSW completed past referral for family to have a wheelchair  ramp built at their residence due to ongoing mobility concerns. Ramp has been successfully installed.  PCS actively involved with patient and providing ongoing services.  Positive reinforcement provided to patient for quitting smoking cigarettes. Coping skill review education provided for future triggers.  Brief self-care education provided to both patient and spouse. Patient confirms that she continues to go to food pantry for food insecurity. Resource information has been provided.  Patient reports ongoing pain and issues with mobility. Patient shares that her pain resides in her pains and legs. Emotional support and coping skill education provided. Patient confirms that her PCS aide continues to provide her with services. However, aide has to be reminded to put on her mask since patient is high risk. LCSW reviewed resources that [name] provided her with in the past.  Patient reports that she had a wonderful birthday spent with her family this past May. Patient reports receiving appropriate socialization. Patient reports that her spouse took her out to eat  and gifted her with several things which was a nice surprise.  Patient confirms stable transportation arrangements for upcoming PCP appointment this week.  Patient repots that she is losing a lot of weight but is not sure how much she has lost as she does not have a scale. Patient reports that a goal of hers is to lose weight but she has not been intentionally losing weight at this time.  Patient reports that her aide [name] continues to assist her with PCS and is an excellent support within the home for her and spouse.  Patient planted a tomato plant and successfully grew over 5 tomatoes over the summer. Patient is very proud of this achievement as this was her first time planting a tomato plant and is now interested in picking up gardening/planting as a hobby. Patient reports that her aunt continues to be a positive support for her in her life and someone that she can rely on in the case of emergencies.  Patient was educated on healthy self-care skills and advised her to use these into her daily routine.  04/10/21- Update- Patient is agreeable to social work closure at this time as she still does not need medication management or counseling as she is managing her mental health well at this time. Patient talks to her younger sister everyday to gain emotional support. Patient says that her sister is into body building and helps to educate and remind her to do her own self-care.  Patient reports that she has been elevating her legs daily to elevate pain and swelling.  [facility] LCSW completed care coordination with Thomasene Lot on 01/15/21 and [facility] LCSW provided the following suggested resource-It is likely their best option given their loss of housing and lack of consistent income:   Publishing rights manager                                                                                                                       (Emergency [place]) [phone]  Ext. 104 M-F, 8am -  4pm Patient was successfully approved for food stamps but is still experiencing financial concerns. She reports that she and her husband have no clothes that fit them and they are having to start using a string for their pants and patient only has one bra at this time. [facility] LCSW will send referral to [facility] BSW today for financial support.  BSW spoke with patient about resources needed. Patient states she does receive SSI and was approved for foodstamps. Patient is familiar with all of the food pantries and other resources in [place]. BSW will assist patient with finding an eye doctor. Patient states she has not been in 10 years. BSW will research and put a list of eye doctors together that accept Medicaid in [facility]. 04/30/20: BSW contacted and spoke with patient, she stated she was not doing good because she found out that she has a new landlord and they are going up on her rent. BSW informed patient to make sure she informs her social workers at [employer] that they have an increase for foodstamps and Medicaid. Patient stated that she sleeps on the couch and is in need of a power recliner/lift chair. BSW will send patients PCP a message asking for a script to be sent. 05/21/21: BSW completed follow up telephone call with patient. She states she is unsure if their new landlord has received their January rent. Patient states she is also worried due to the new landlord making them pay rent online and on the 1st of the month. Patient states she is unable to get anyone on the phone. Patient states her rent will go up to 400 from 255 in March. Patient states she does not have many clothes but goes to [employer] to get some Materials engineer. No other resources needed at this time.   06/20/21: BSW completed follow up telelphone call with patient. Patient states she is doing well. Patient states [employer] [place] will cover all of Vaccines. Patient states no resources are needed at this time. 08/26/21: BSW completed  telephone outreach with patient, she stated since going to the hospital she has been feeling weak. Patient states other than that she is feeling okay. Patient stated she is going to get a new phone on Wednesday. Patient states her birthday is on Friday and her husband may take her to a [place]. No other resources are needed at this time.  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 Car is doing better, they are still able to drive and get around. Patient states husbands dad has been helping them with there transportation and bills. They landlord has gone up to 600 on lot rent, patient states they are going to have to sell there home and move.  Update- Patient reports experiencing additional grief lately. [facility] LCSW offered referral for berievement counseling but she declined. UPDATE 08/26/21- Patient reports that she has a near death experience last month. Patient shares that she had a fall. 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 [facility] LCSW completed education on available mental health resources within her area to utilize. Patient admits to experiencing some sadness due to grief from her mother's passing. Emotional support provided. 10/13/21 UPDATE- Patient reports that she is working hard on living a healthier life both physically and mentally. Patient reports that she is making  active changes in her diet. She reports that she eventually wishes to quit smoking again and has started picking up new hobbies to implement during times of craving such as coloring, crocheting and being creative. Patient reports that she is unsure if she needs mental health support at this time because her main concerns are centered around her and her spouse's physical health instead their mental health. [facility] LCSW educated patient on how they affect one another  and their importance. Patient denies any urgent concerns. She reports no issues with affording food due to food stamp assistance. [facility] LCSW will reassess social work needs 30 days from now. UPDATE 11/17/21- Patient reports that she is experiencing several stressors at this time. She was encouraged to consider mental health treatment but she is not interested at this time. Patient was encouraged to consider mental health resources. Patient reports that their car is in the shop and that they have no stable transportation besides their sister and her husband's father. Patient reports that her spouse has had several recent health issues that have affected her mental state. Patient was educated on healthy coping skills for stress. Patient denies no urgent concerns or issues at this time. UPDATE- Care Coordination completed with [facility] LCSW, PCP office, Pharmacist, [facility] BSW and PCP. Patient has canceled several medical appointments because they are unable to afford a copay. Patient will benefit from financial assistance and may benefit from a [facility] application. Patient has an upcoming appointment with [facility] BSW next week. Patient is agreeable to exploring financial assistance options with [facility] BSW that will help alleviate other areas of financial stress within their life in order for patient to be able to afford medical expenses. [facility] LCSW encouraged patient to consider individual counseling. Patient is agreeable to referral for counseling but needs to find out her sister's email first as she is unable to go to office to fill out intake paperwork. Patient prefers virtual counseling to save money on transportation. [facility] LCSW will successfully place referral to [facility] during next outreach. [facility] LCSW completed care coordination with [facility] BSW.  BSW 12/15/21: BSW completed a telephone outreach with patient. She stated everything is still going okay. She has been having problems with her leg wraps and has an appointment  today. She states her husband is still having issues with his eye and with the doctor and she is going to make a compliant. Patient states no resources are needed at this time. [facility] LCSW 01/19/22: Patient reports that she was unable to sleep last night. She is experiencing a break out skin reaction that she feels is caused by stress. Her financial stressors have increased since last session. Patient is agreeable to [facility] LCSW placing referral for [facility] now but reports that she has no email address. [facility] LCSW completed referral and made note of her inability to get into her email. [facility] LCSW will follow up within two weeks to ensure that she was enrolled in [facility] counseling program. [facility] completed care coordination call ONLY to [facility] and they were unable to locate patient's referral that was made on 01/19/22. [facility] LCSW resubmitted this referral today on 01/29/22 for telephonic counseling. Patient was made aware by telephone call that [facility] will contact her by the end of the day tomorrow to schedule a time for her to come into their office to complete in person intake paperwork. Patient is agreeable to this plan. UPDATE 02/03/22- Patient reports that she missed a call from June  the receptionist at [facility] and call her back but has not heard back yet. [facility] LCSW provided patient with her number and directions on which prompt to enter to be directed to June. Patient will ask June about her phone as patient is unsure if she can make video calls and complete telephonic therapy which would mean she would need to go for in person sessions. Patient is agreeable to plan. Patient was able to find an assistance program for rent through [facility] on her own. Positive reinforcement provided for this resource find. [facility] LCSW collaborated with entire team. [facility] LCSW will follow up in two weeks to ensure that patient was able to get connected with either telephonic or in person  therapy services at [facility]. UPDATE 02/23/22-Patient has decided to decline therapy at this time stating that she "just has too much going on to add in anything else into her schedule." [facility] LCSW will update team on this decision. Reflective and active listening provided during session. [facility] LCSW will follow up in 90 days to ask patient again about mental health support involvement. Patient is agreeable to this plan. 04/24/22- Patient reports that she is still experiencing a skin reaction from off brand dish soap that she used. She reports that this has been going on for months. Healthy self-care education provided to patient to promote healthy hygiene. Patient reports that she is still not interested in counseling right now but confirms that she is stable. She reports that she is now on oxygen which she started back in the fall of 2023. She shares that she has NOT been smoking. Positive reinforcement provided for this. Patient reports that she put up two Christmas trees this year and one was her mother's. Patient reports that her mother's Christmas tree serves as a reminder of the love and special relationship that she had with her mother. [facility] LCSW 06/25/22 update- Patient reports that she is stable and her emotions/feelings have been more manageable lately but she and her spouse are still struggling financially. Patient still declines wanting a referral for a long term therapist so that she can help manage these financial stressors that trigger her stress. Patient is aware of [facility] resource that she was very close to starting therapy with in 2023. Stress management education provided. Patient is agreeable to a 60 day follow up call.  Past BSW update-BSW completed a telephone outreach with patient. She stated she is still itching really bad and thinks she has shingles. She has put up her Christmas decorations and has 2 trees. Patient states she has been stressed about medical bills, but the  holidays are keeping her in good spirits. Patient states they are still having financial issues.  BSW completed a telephone outreach with patient. She stated she is doing well. They were able to go to a wholesale store in [place] and get some food. Patient stated she has some appointments coming up including the skin doctor but that is not until November. Patient stated she is on the list for a sooner appointment if someone cancels. BSW contacted other skin centers in the area and they do not accept patients insurance. No other resources are needed at this time.  Managing Loss, Adult People experience loss in many different ways throughout their lives. Events such as moving, changing jobs, and losing friends can create a sense of loss. The loss may be as serious as a major health change, divorce, death of a pet, or death of a loved one.  All of these types of loss are likely to create a physical and emotional reaction known as grief. Grief is the result of a major change or an absence of something or someone that you count on. Grief is a normal reaction to loss. A variety of factors can affect your grieving experience, including: The nature of your loss. Your relationship to what or whom you lost. Your understanding of grief and how to manage it. Your support system. How to manage lifestyle changes Keep to your normal routine as much as possible. If you have trouble focusing or doing normal activities, it is acceptable to take some time away from your normal routine. Spend time with friends and loved ones. Eat a healthy diet, get plenty of sleep, and rest when you feel tired. How to recognize changes  The way that you deal with your grief will affect your ability to function as you normally do. When grieving, you may experience these changes: Numbness, shock, sadness, anxiety, anger, denial, and guilt. Thoughts about death. Unexpected crying. A physical sensation of emptiness in your  stomach. Problems sleeping and eating. Tiredness (fatigue). Loss of interest in normal activities. Dreaming about or imagining seeing the person who died. A need to remember what or whom you lost. Difficulty thinking about anything other than your loss for a period of time. Relief. If you have been expecting the loss for a while, you may feel a sense of relief when it happens. Follow these instructions at home:    Activity Express your feelings in healthy ways, such as: Talking with others about your loss. It may be helpful to find others who have had a similar loss, such as a support group. Writing down your feelings in a journal. Doing physical activities to release stress and emotional energy. Doing creative activities like painting, sculpting, or playing or listening to music. Practicing resilience. This is the ability to recover and adjust after facing challenges. Reading some resources that encourage resilience may help you to learn ways to practice those behaviors. General instructions Be patient with yourself and others. Allow the grieving process to happen, and remember that grieving takes time. It is likely that you may never feel completely done with some grief. You may find a way to move on while still cherishing memories and feelings about your loss. Accepting your loss is a process. It can take months or longer to adjust. Keep all follow-up visits as told by your health care provider. This is important. Where to find support To get support for managing loss: Ask your health care provider for help and recommendations, such as grief counseling or therapy. Think about joining a support group for people who are managing a loss. Where to find more information You can find more information about managing loss from: American Society of Clinical Oncology: www.cancer.net American Psychological Association: TVStereos.ch Contact a health care provider if: Your grief is extreme and  keeps getting worse. You have ongoing grief that does not improve. Your body shows symptoms of grief, such as illness. You feel depressed, anxious, or lonely. Get help right away if: You have thoughts about hurting yourself or others. If you ever feel like you may hurt yourself or others, or have thoughts about taking your own life, get help right away. You can go to your nearest emergency department or call: Your local emergency services (911 in the [place] A suicide crisis helpline, such as the [facility] at 859-220-8722. This is open 24 hours a  day. Summary Grief is the result of a major change or an absence of someone or something that you count on. Grief is a normal reaction to loss. The depth of grief and the period of recovery depend on the type of loss and your ability to adjust to the change and process your feelings. Processing grief requires patience and a willingness to accept your feelings and talk about your loss with people who are supportive. It is important to find resources that work for you and to realize that people experience grief differently. There is not one grieving process that works for everyone in the same way. Be aware that when grief becomes extreme, it can lead to more severe issues like isolation, depression, anxiety, or suicidal thoughts. Talk with your health care provider if you have any of these issues. This information is not intended to replace advice given to you by your health care provider. Make sure you discuss any questions you have with your health care provider. Document Revised: 06/17/2018 Document Reviewed: 08/27/2016 Elsevier Patient Education  [place] for Managing Grief   When you are experiencing grief, many resources and support are available to help you. You are not alone.    Grief Share (https://www.https://jacobson-moore.net/):    [employer] [facility] [place], [place]  [phone] [address] [place], [place], [place], [place], [place]   [facility] [address] [place], [place]   [place], [place]   If a Hospice or [place] team has been involved in the care of your loved one, you may reach out to your contact there or locally, you may reach out to [facility]:    [facility]osp[facility] and [place] [facility]   [place], [place] [address] [phone] Patient was educated on healthy self-care as she admits she has little motivation to walk lately. PCS recommendation was suggested.   Patient Self Care Activities:  Attends all scheduled provider appointments Calls provider office for new concerns or questions  Please see past updates related to this goal by clicking on the "Past Updates" button in the selected goal        Follow up:  Patient agrees to Care Plan and Follow-up.  Plan: The Managed Medicaid care management team will reach out to the patient again over the next 60 days.  Date/time of next scheduled Social Work care management/care coordination outreach:  07/28/22 at 230 pm  Eula Fried, [place], MSW, [place] Medicaid LCSW [email or web address] Phone: [phone]

## 2022-06-25 NOTE — Patient Instructions (Signed)
Visit Information  Ms. Saulter was given information about Medicaid Managed Care team care coordination services as a part of their [facility] Medicaid benefit. Oyindamola Mckinna Mapes verbally consented to engagement with the [facility] Managed Care team.   If you are experiencing a medical emergency, please call 911 or report to your local emergency department or urgent care.   If you have a non-emergency medical problem during routine business hours, please contact your provider's office and ask to speak with a nurse.   For questions related to your [facility] health plan, please call: [phone] or go here:[email or web address]  If you would like to schedule transportation through your [facility] plan, please call the following number at least 2 days in advance of your appointment: [phone].  You can also use the MTM portal or MTM mobile app to manage your rides. For the portal, please go to [email or web address].  Call the [place] at [phone], at any time, 24 hours a day, 7 days a week. If you are in danger or need immediate medical attention call 911.  If you would like help to quit smoking, call 1-800-QUIT-NOW (1-(937)136-3350) OR Espaol: 1-855-Djelo-Ya QO:409462) o para ms informacin haga clic aqu or Text READY to 200-400 to register via text   Following is a copy of your plan of care:  Care Plan : General Social Work (Adult)  Updates made by [name], LCSW since 06/25/2022 12:00 AM     Problem: Coping Skills (General Plan of Care)      Long-Range Goal: Coping Skills Enhanced   Start Date: 11/07/2020  Expected End Date: 03/07/2022  Recent Progress: On track  Priority: High  Note:   Timeframe:  Long-Range Goal Priority:  High Start Date:       08/26/21                    Expected End Date:  ongoing   Current Barriers:  Financial constraints Mental Health Concerns  Social Isolation Limited education about mental  health support resources that are available to her within the local area* Cognitive Deficits Lacks knowledge of community resource   Clinical Social Work Clinical Goal(s):  Over the next 90 days, client will work with SW to address concerns related to experiencing ongoing symptoms of depression, sadness and grief since the passing of her mother Over the next 90 days, patient will work with LCSW to address needs related to implementing appropriate self-care and depression management.    Managing Loss, Adult People experience loss in many different ways throughout their lives. Events such as moving, changing jobs, and losing friends can create a sense of loss. The loss may be as serious as a major health change, divorce, death of a pet, or death of a loved one. All of these types of loss are likely to create a physical and emotional reaction known as grief. Grief is the result of a major change or an absence of something or someone that you count on. Grief is a normal reaction to loss. A variety of factors can affect your grieving experience, including: The nature of your loss. Your relationship to what or whom you lost. Your understanding of grief and how to manage it. Your support system. How to manage lifestyle changes Keep to your normal routine as much as possible. If you have trouble focusing or doing normal activities, it is acceptable to take some time away from your normal routine. Spend time with friends and loved ones. Eat  a healthy diet, get plenty of sleep, and rest when you feel tired. How to recognize changes  The way that you deal with your grief will affect your ability to function as you normally do. When grieving, you may experience these changes: Numbness, shock, sadness, anxiety, anger, denial, and guilt. Thoughts about death. Unexpected crying. A physical sensation of emptiness in your stomach. Problems sleeping and eating. Tiredness (fatigue). Loss of interest in  normal activities. Dreaming about or imagining seeing the person who died. A need to remember what or whom you lost. Difficulty thinking about anything other than your loss for a period of time. Relief. If you have been expecting the loss for a while, you may feel a sense of relief when it happens. Follow these instructions at home:    Activity Express your feelings in healthy ways, such as: Talking with others about your loss. It may be helpful to find others who have had a similar loss, such as a support group. Writing down your feelings in a journal. Doing physical activities to release stress and emotional energy. Doing creative activities like painting, sculpting, or playing or listening to music. Practicing resilience. This is the ability to recover and adjust after facing challenges. Reading some resources that encourage resilience may help you to learn ways to practice those behaviors. General instructions Be patient with yourself and others. Allow the grieving process to happen, and remember that grieving takes time. It is likely that you may never feel completely done with some grief. You may find a way to move on while still cherishing memories and feelings about your loss. Accepting your loss is a process. It can take months or longer to adjust. Keep all follow-up visits as told by your health care provider. This is important. Where to find support To get support for managing loss: Ask your health care provider for help and recommendations, such as grief counseling or therapy. Think about joining a support group for people who are managing a loss. Where to find more information You can find more information about managing loss from: American Society of Clinical Oncology: www.cancer.net American Psychological Association: TVStereos.ch Contact a health care provider if: Your grief is extreme and keeps getting worse. You have ongoing grief that does not improve. Your body shows  symptoms of grief, such as illness. You feel depressed, anxious, or lonely. Get help right away if: You have thoughts about hurting yourself or others. If you ever feel like you may hurt yourself or others, or have thoughts about taking your own life, get help right away. You can go to your nearest emergency department or call: Your local emergency services (911 in the [place] A suicide crisis helpline, such as the [facility] at 385-411-1773. This is open 24 hours a day. Summary Grief is the result of a major change or an absence of someone or something that you count on. Grief is a normal reaction to loss. The depth of grief and the period of recovery depend on the type of loss and your ability to adjust to the change and process your feelings. Processing grief requires patience and a willingness to accept your feelings and talk about your loss with people who are supportive. It is important to find resources that work for you and to realize that people experience grief differently. There is not one grieving process that works for everyone in the same way. Be aware that when grief becomes extreme, it can lead to more severe issues  like isolation, depression, anxiety, or suicidal thoughts. Talk with your health care provider if you have any of these issues. This information is not intended to replace advice given to you by your health care provider. Make sure you discuss any questions you have with your health care provider. Document Revised: 06/17/2018 Document Reviewed: 08/27/2016 Elsevier Patient Education  [place] for Managing Grief   When you are experiencing grief, many resources and support are available to help you. You are not alone.    Grief Share (https://www.https://jacobson-moore.net/):    [employer] [facility] [place], [place]  [phone] [address] [place], [place], [place], [place], [place]   [facility] [address] [place], [place]   [place], [place]   If a Hospice or [place] team has been involved in the care of your loved one, you may reach out to your contact there or locally, you may reach out to [facility]osp[facility] of [facility]:    Hosp[facility] and [place] [facility]   [place], [place] [address] [phone] Patient was educated on healthy self-care as she admits she has little motivation to walk lately. PCS recommendation was suggested.   Patient Self Care Activities:  Attends all scheduled provider appointments Calls provider office for new concerns or questions  Please see past updates related to this goal by clicking on the "Past Updates" button in the selected goal

## 2022-06-26 DIAGNOSIS — Z419 Encounter for procedure for purposes other than remedying health state, unspecified: Secondary | ICD-10-CM | POA: Diagnosis not present

## 2022-06-30 ENCOUNTER — Other Ambulatory Visit: Payer: Medicaid Other

## 2022-06-30 NOTE — Patient Instructions (Signed)
Visit Information  Ms. Rzepecki was given information about Medicaid Managed Care team care coordination services as a part of their [facility] Medicaid benefit. Siobhan Llana Zhou verbally consented to engagement with the [facility] Managed Care team.   If you are experiencing a medical emergency, please call 911 or report to your local emergency department or urgent care.   If you have a non-emergency medical problem during routine business hours, please contact your provider's office and ask to speak with a nurse.   For questions related to your [facility] health plan, please call: [phone] or go here:[email or web address]  If you would like to schedule transportation through your [facility] plan, please call the following number at least 2 days in advance of your appointment: [phone].  You can also use the MTM portal or MTM mobile app to manage your rides. For the portal, please go to [email or web address].  Call the [place] at [phone], at any time, 24 hours a day, 7 days a week. If you are in danger or need immediate medical attention call 911.  If you would like help to quit smoking, call 1-800-QUIT-NOW (952) 369-5618) OR Espaol: 1-855-Djelo-Ya HD:1601594) o para ms informacin haga clic aqu or Text READY to 200-400 to register via text  Ms. Belton - following are the goals we discussed in your visit today:   Goals Addressed   None      The  Patient                                              has been provided with contact information for the Managed Medicaid care management team and has been advised to call with any health related questions or concerns.  Mickel Fuchs, BSW, [place]  High Risk Managed Medicaid Team  [phone]   Following is a copy of your plan of care:  Care Plan : General Social Work (Adult)  Updates made by Ethelda Chick since 06/30/2022 12:00 AM      Problem: Coping Skills (General Plan of Care)      Long-Range Goal: Coping Skills Enhanced   Start Date: 11/07/2020  Expected End Date: 03/07/2022  Recent Progress: On track  Priority: High  Note:   Timeframe:  Long-Range Goal Priority:  High Start Date:       08/26/21                    Expected End Date:  ongoing   Current Barriers:  Financial constraints Mental Health Concerns  Social Isolation Limited education about mental health support resources that are available to her within the local area* Cognitive Deficits Lacks knowledge of community resource   Clinical Social Work Clinical Goal(s):  Over the next 90 days, client will work with SW to address concerns related to experiencing ongoing symptoms of depression, sadness and grief since the passing of her mother Over the next 90 days, patient will work with LCSW to address needs related to implementing appropriate self-care and depression management.    Interventions: Patient interviewed and appropriate assessments performed Provided mental health counseling with regards to patient's loss and ongoing stressors. Patient was very close with her mother who passed away a few years ago and she continues to experience symptoms of grief from this loss. Patient denies wanting LCSW to make a referral for  grief counseling through [facility] again but was receptive to coping skill and resource education/information that was provided during session today. Patient reports decorating for the holidays helps her to connect with her mother as they did this together when she was alive.  Provided patient with information about managing depression within her daily life. Patient has ongoing grief symptoms as well. Patient states "I feel worn down." Patient reports that she talks out loud to her mother's picture and this is very helpful to her. Grief support resource education provided again during session today.  Provided reflective listening and  implemented appropriate interventions to help support patient and her emotional needs  Discussed plans with patient for ongoing care management follow up and provided patient with direct contact information for [facility] team Advised patient to contact [facility] team with any urgent case management needs. Assisted patient/caregiver with obtaining information about health plan benefits LCSW completed past referral for family to have a wheelchair ramp built at their residence due to ongoing mobility concerns. Ramp has been successfully installed.  PCS actively involved with patient and providing ongoing services.  Positive reinforcement provided to patient for quitting smoking cigarettes. Coping skill review education provided for future triggers.  Brief self-care education provided to both patient and spouse. Patient confirms that she continues to go to food pantry for food insecurity. Resource information has been provided.  Patient reports ongoing pain and issues with mobility. Patient shares that her pain resides in her pains and legs. Emotional support and coping skill education provided. Patient confirms that her PCS aide continues to provide her with services. However, aide has to be reminded to put on her mask since patient is high risk. LCSW reviewed resources that [name] provided her with in the past.  Patient reports that she had a wonderful birthday spent with her family this past May. Patient reports receiving appropriate socialization. Patient reports that her spouse took her out to eat and gifted her with several things which was a nice surprise.  Patient confirms stable transportation arrangements for upcoming PCP appointment this week.  Patient repots that she is losing a lot of weight but is not sure how much she has lost as she does not have a scale. Patient reports that a goal of hers is to lose weight but she has not been intentionally losing weight at this time.  Patient reports that her  aide [name] continues to assist her with PCS and is an excellent support within the home for her and spouse.  Patient planted a tomato plant and successfully grew over 5 tomatoes over the summer. Patient is very proud of this achievement as this was her first time planting a tomato plant and is now interested in picking up gardening/planting as a hobby. Patient reports that her aunt continues to be a positive support for her in her life and someone that she can rely on in the case of emergencies.  Patient was educated on healthy self-care skills and advised her to use these into her daily routine.  04/10/21- Update- Patient is agreeable to social work closure at this time as she still does not need medication management or counseling as she is managing her mental health well at this time. Patient talks to her younger sister everyday to gain emotional support. Patient says that her sister is into body building and helps to educate and remind her to do her own self-care.  Patient reports that she has been elevating her legs daily to elevate pain and  swelling.  [facility] LCSW completed care coordination with Thomasene Lot on 01/15/21 and [facility] LCSW provided the following suggested resource-It is likely their best option given their loss of housing and lack of consistent income:   Publishing rights manager                                                                                                                       (Emergency [place]) [phone] Ext. 104 M-F, 8am - 4pm Patient was successfully approved for food stamps but is still experiencing financial concerns. She reports that she and her husband have no clothes that fit them and they are having to start using a string for their pants and patient only has one bra at this time. [facility] LCSW will send referral to [facility] BSW today for financial support.  BSW spoke with patient about resources needed. Patient states she does receive SSI and  was approved for foodstamps. Patient is familiar with all of the food pantries and other resources in [place]. BSW will assist patient with finding an eye doctor. Patient states she has not been in 10 years. BSW will research and put a list of eye doctors together that accept Medicaid in [facility]. 04/30/20: BSW contacted and spoke with patient, she stated she was not doing good because she found out that she has a new landlord and they are going up on her rent. BSW informed patient to make sure she informs her social workers at [employer] that they have an increase for foodstamps and Medicaid. Patient stated that she sleeps on the couch and is in need of a power recliner/lift chair. BSW will send patients PCP a message asking for a script to be sent. 05/21/21: BSW completed follow up telephone call with patient. She states she is unsure if their new landlord has received their January rent. Patient states she is also worried due to the new landlord making them pay rent online and on the 1st of the month. Patient states she is unable to get anyone on the phone. Patient states her rent will go up to 400 from 255 in March. Patient states she does not have many clothes but goes to [employer] to get some Materials engineer. No other resources needed at this time.   06/20/21: BSW completed follow up telelphone call with patient. Patient states she is doing well. Patient states [employer] [place] will cover all of Vaccines. Patient states no resources are needed at this time. 08/26/21: BSW completed telephone outreach with patient, she stated since going to the hospital she has been feeling weak. Patient states other than that she is feeling okay. Patient stated she is going to get a new phone on Wednesday. Patient states her birthday is on Friday and her husband may take her to a [place]. No other resources are needed at this time.  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 Car is doing better, they are still able to drive and get around. Patient states husbands dad has been helping them with there transportation and bills. They landlord has gone up to 600 on lot rent, patient states they are going to have to sell there home and move.  Update- Patient reports experiencing additional grief lately. [facility] LCSW offered referral for berievement counseling but she declined. UPDATE 08/26/21- Patient reports that she has a near death experience last month. Patient shares that she had a fall. 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 [facility] LCSW completed education on available mental health resources within her area to utilize. Patient admits to experiencing some sadness due to grief from her mother's passing. Emotional support provided. 10/13/21 UPDATE- Patient reports that she is working hard on living a healthier life both physically and mentally. Patient reports that she is making active changes in her diet. She reports that she eventually wishes to quit smoking again and has started picking up new hobbies to implement during times of craving such as coloring, crocheting and being creative. Patient reports that she is unsure if she needs mental health support at this time because her main concerns are centered around her and her spouse's physical health instead their mental health. [facility] LCSW educated patient on how they affect one another and their importance. Patient denies any urgent concerns. She reports no issues with affording food due to food stamp assistance. [facility] LCSW will reassess social work needs 30 days from now. UPDATE 11/17/21- Patient reports that she is experiencing several stressors at this time. She was encouraged to consider mental health treatment but she is not interested at this time. Patient was encouraged to consider mental health  resources. Patient reports that their car is in the shop and that they have no stable transportation besides their sister and her husband's father. Patient reports that her spouse has had several recent health issues that have affected her mental state. Patient was educated on healthy coping skills for stress. Patient denies no urgent concerns or issues at this time. UPDATE- Care Coordination completed with [facility] LCSW, PCP office, Pharmacist, [facility] BSW and PCP. Patient has canceled several medical appointments because they are unable to afford a copay. Patient will benefit from financial assistance and may benefit from a [facility] application. Patient has an upcoming appointment with [facility] BSW next week. Patient is agreeable to exploring financial assistance options with [facility] BSW that will help alleviate other areas of financial stress within their life in order for patient to be able to afford medical expenses. [facility] LCSW encouraged patient to consider individual counseling. Patient is agreeable to referral for counseling but needs to find out her sister's email first as she is unable to go to office to fill out intake paperwork. Patient prefers virtual counseling to save money on transportation. [facility] LCSW will successfully place referral to [facility] during next outreach. [facility] LCSW completed care coordination with [facility] BSW.  BSW 12/15/21: BSW completed a telephone outreach with patient. She stated everything is still going okay. She has been having problems with her leg wraps and has an appointment today. She states her husband is still having issues with his eye and with the doctor and she is going to make a compliant. Patient states no resources are needed at this time. [facility] LCSW 01/19/22: Patient reports that she was unable to sleep last night. She is experiencing a  break out skin reaction that she feels is caused by stress. Her financial stressors have increased since last session. Patient is agreeable to [facility]  LCSW placing referral for [facility] now but reports that she has no email address. [facility] LCSW completed referral and made note of her inability to get into her email. [facility] LCSW will follow up within two weeks to ensure that she was enrolled in [facility] counseling program. [facility] completed care coordination call ONLY to [facility] and they were unable to locate patient's referral that was made on 01/19/22. [facility] LCSW resubmitted this referral today on 01/29/22 for telephonic counseling. Patient was made aware by telephone call that [facility] will contact her by the end of the day tomorrow to schedule a time for her to come into their office to complete in person intake paperwork. Patient is agreeable to this plan. UPDATE 02/03/22- Patient reports that she missed a call from June the receptionist at [facility] and call her back but has not heard back yet. [facility] LCSW provided patient with her number and directions on which prompt to enter to be directed to June. Patient will ask June about her phone as patient is unsure if she can make video calls and complete telephonic therapy which would mean she would need to go for in person sessions. Patient is agreeable to plan. Patient was able to find an assistance program for rent through [facility] on her own. Positive reinforcement provided for this resource find[facility] LCSW collaborated with entire team. [facility] LCSW will follow up in two weeks to ensure that patient was able to get connected with either telephonic or in person therapy services at [facility]. UPDATE 02/23/22-Patient has decided to decline therapy at this time stating that she "just has too much going on to add in anything else into her schedule." [facility] LCSW will update team on this decision. Reflective and active listening provided during session. [facility] LCSW will follow up in 90 days to ask patient again about mental health support involvement. Patient is agreeable to this  plan. 04/24/22- Patient reports that she is still experiencing a skin reaction from off brand dish soap that she used. She reports that this has been going on for months. Healthy self-care education provided to patient to promote healthy hygiene. Patient reports that she is still not interested in counseling right now but confirms that she is stable. She reports that she is now on oxygen which she started back in the fall of 2023. She shares that she has NOT been smoking. Positive reinforcement provided for this. Patient reports that she put up two Christmas trees this year and one was her mother's. Patient reports that her mother's Christmas tree serves as a reminder of the love and special relationship that she had with her mother. [facility] LCSW 06/25/22 update- Patient reports that she is stable and her emotions/feelings have been more manageable lately but she and her spouse are still struggling financially. Patient still declines wanting a referral for a long term therapist so that she can help manage these financial stressors that trigger her stress. Patient is aware of [facility] resource that she was very close to starting therapy with in 2023. Stress management education provided. Patient is agreeable to a 60 day follow up call.  Past BSW update-BSW completed a telephone outreach with patient. She stated she is still itching really bad and thinks she has shingles. She has put up her Christmas decorations and has 2  trees. Patient states she has been stressed about medical bills, but the holidays are keeping her in good spirits. Patient states they are still having financial issues.  BSW completed a telephone outreach with patient. She stated she is doing well. They were able to go to a wholesale store in [place] and get some food. Patient stated she has some appointments coming up including the skin doctor but that is not until November. Patient stated she is on the list for a sooner appointment if someone  cancels. BSW contacted other skin centers in the area and they do not accept patients insurance. No other resources are needed at this time. BSW completed a telephone outreach with patient, she stated they may start looking for somewhere else to live due to the rent rising in. Patient stated they are okay, and surviving. Patient stated no resources are needed at this time.  Managing Loss, Adult People experience loss in many different ways throughout their lives. Events such as moving, changing jobs, and losing friends can create a sense of loss. The loss may be as serious as a major health change, divorce, death of a pet, or death of a loved one. All of these types of loss are likely to create a physical and emotional reaction known as grief. Grief is the result of a major change or an absence of something or someone that you count on. Grief is a normal reaction to loss. A variety of factors can affect your grieving experience, including: The nature of your loss. Your relationship to what or whom you lost. Your understanding of grief and how to manage it. Your support system. How to manage lifestyle changes Keep to your normal routine as much as possible. If you have trouble focusing or doing normal activities, it is acceptable to take some time away from your normal routine. Spend time with friends and loved ones. Eat a healthy diet, get plenty of sleep, and rest when you feel tired. How to recognize changes  The way that you deal with your grief will affect your ability to function as you normally do. When grieving, you may experience these changes: Numbness, shock, sadness, anxiety, anger, denial, and guilt. Thoughts about death. Unexpected crying. A physical sensation of emptiness in your stomach. Problems sleeping and eating. Tiredness (fatigue). Loss of interest in normal activities. Dreaming about or imagining seeing the person who died. A need to remember what or whom you  lost. Difficulty thinking about anything other than your loss for a period of time. Relief. If you have been expecting the loss for a while, you may feel a sense of relief when it happens. Follow these instructions at home:    Activity Express your feelings in healthy ways, such as: Talking with others about your loss. It may be helpful to find others who have had a similar loss, such as a support group. Writing down your feelings in a journal. Doing physical activities to release stress and emotional energy. Doing creative activities like painting, sculpting, or playing or listening to music. Practicing resilience. This is the ability to recover and adjust after facing challenges. Reading some resources that encourage resilience may help you to learn ways to practice those behaviors. General instructions Be patient with yourself and others. Allow the grieving process to happen, and remember that grieving takes time. It is likely that you may never feel completely done with some grief. You may find a way to move on while still cherishing memories and feelings about  your loss. Accepting your loss is a process. It can take months or longer to adjust. Keep all follow-up visits as told by your health care provider. This is important. Where to find support To get support for managing loss: Ask your health care provider for help and recommendations, such as grief counseling or therapy. Think about joining a support group for people who are managing a loss. Where to find more information You can find more information about managing loss from: American Society of Clinical Oncology: www.cancer.net American Psychological Association: TVStereos.ch Contact a health care provider if: Your grief is extreme and keeps getting worse. You have ongoing grief that does not improve. Your body shows symptoms of grief, such as illness. You feel depressed, anxious, or lonely. Get help right away if: You have  thoughts about hurting yourself or others. If you ever feel like you may hurt yourself or others, or have thoughts about taking your own life, get help right away. You can go to your nearest emergency department or call: Your local emergency services (911 in the [place] A suicide crisis helpline, such as the [place] at [phone]. This is open 24 hours a day. Summary Grief is the result of a major change or an absence of someone or something that you count on. Grief is a normal reaction to loss. The depth of grief and the period of recovery depend on the type of loss and your ability to adjust to the change and process your feelings. Processing grief requires patience and a willingness to accept your feelings and talk about your loss with people who are supportive. It is important to find resources that work for you and to realize that people experience grief differently. There is not one grieving process that works for everyone in the same way. Be aware that when grief becomes extreme, it can lead to more severe issues like isolation, depression, anxiety, or suicidal thoughts. Talk with your health care provider if you have any of these issues. This information is not intended to replace advice given to you by your health care provider. Make sure you discuss any questions you have with your health care provider. Document Revised: 06/17/2018 Document Reviewed: 08/27/2016 Elsevier Patient Education  [place] for Managing Grief   When you are experiencing grief, many resources and support are available to help you. You are not alone.    Grief Share (https://www.https://jacobson-moore.net/):    [employer], [place]  [phone], [place], [place] [place], [place], [place]   [facility] [address] [place], [place]   [place], [place]   If a Hospice or [place] team has been involved in the care of your loved one, you may reach out to your contact there or locally, you may reach out to [facility]:    [facility] [place] [facility]   [place], [place] [address] [phone] Patient was educated on healthy self-care as she admits she has little motivation to walk lately.  PCS recommendation was suggested.   Patient Self Care Activities:  Attends all scheduled provider appointments Calls provider office for new concerns or questions  Please see past updates related to this goal by clicking on the "Past Updates" button in the selected goal

## 2022-07-03 ENCOUNTER — Ambulatory Visit: Payer: Medicaid Other | Attending: Pulmonary Disease

## 2022-07-03 DIAGNOSIS — R0602 Shortness of breath: Secondary | ICD-10-CM | POA: Diagnosis not present

## 2022-07-03 MED ORDER — PERFLUTREN LIPID MICROSPHERE
1.0000 mL | INTRAVENOUS | Status: AC | PRN
  Administered 2022-07-03: 5 mL via INTRAVENOUS

## 2022-07-04 LAB — ECHOCARDIOGRAM COMPLETE
Area-P 1/2: 3.53 cm2
S' Lateral: 4.2 cm

## 2022-07-08 DIAGNOSIS — R3981 Functional urinary incontinence: Secondary | ICD-10-CM | POA: Diagnosis not present

## 2022-07-08 DIAGNOSIS — Z993 Dependence on wheelchair: Secondary | ICD-10-CM | POA: Diagnosis not present

## 2022-07-17 ENCOUNTER — Telehealth: Payer: Self-pay | Admitting: Pulmonary Disease

## 2022-07-20 ENCOUNTER — Encounter: Payer: Self-pay | Admitting: Obstetrics and Gynecology

## 2022-07-20 ENCOUNTER — Other Ambulatory Visit: Payer: Medicaid Other | Admitting: Obstetrics and Gynecology

## 2022-07-20 ENCOUNTER — Ambulatory Visit: Payer: Medicaid Other | Admitting: Cardiovascular Disease

## 2022-07-20 NOTE — Patient Outreach (Signed)
Medicaid Managed Care   Nurse Care Manager Note  07/20/2022 Name:  Cheryl Chandler MRN:  [id number] DOB:  [birth year]  Cheryl Chandler is an 57 y.o. year old female who is a primary patient of [name], NP.  The [facility] Managed Care Coordination team was consulted for assistance with:    Chronic healthcare management needs, LBP, OSA, GERD, HTN, DM, COPD, anxiety/depression, lymphedema, H/A, hypothyroid, arthritis, HLD,   Cheryl Chandler was given information about Medicaid Managed Care Coordination team services today. [place] Patient agreed to services and verbal consent obtained.  Engaged with patient by telephone for follow up visit in response to provider referral for case management and/or care coordination services.   Assessments/Interventions:  Review of past medical history, allergies, medications, health status, including review of consultants reports, laboratory and other test data, was performed as part of comprehensive evaluation and provision of chronic care management services.  SDOH (Social Determinants of Health) assessments and interventions performed: [name]    Flowsheet Row Patient Outreach Telephone from 07/20/2022 in [place] Patient Outreach Telephone from 06/25/2022 in [place] Patient Outreach Telephone from 06/18/2022 in [place] Patient Outreach Telephone from 05/21/2022 in [place] Office Visit from 05/01/2022 in [place] Patient Outreach Telephone from 04/24/2022 in [place]  [name]        Housing Interventions -- -- Intervention Not Indicated -- -- --  Utilities Interventions -- -- Intervention Not Indicated -- -- --  Alcohol Usage Interventions Intervention Not Indicated (Score <7) -- -- -- -- --  Depression Interventions/Treatment   -- -- -- -- Medication, Currently on Treatment --  Financial Strain Interventions -- -- -- Other (Comment)  [referrals placed -BSW assisting] -- --  Physical Activity Interventions -- -- -- Other (Comments)  [not physically able to] -- --  Stress Interventions -- Intervention Not Indicated -- -- -- [employer], Provide Counseling     Care Plan  Allergies  Allergen Reactions   Codeine Shortness Of Breath and Rash   Percocet [Oxycodone-Acetaminophen] Shortness Of Breath   Sulfa Antibiotics Shortness Of Breath and Rash   Aspirin Hives   Bee Venom     Bee stings   Darvon [Propoxyphene]     Darvocet-N 100   Latex    Oxycodone Nausea And Vomiting   Penicillins     Has patient had a PCN reaction causing immediate rash, facial/tongue/throat swelling, SOB or lightheadedness with hypotension: Unknown Has patient had a PCN reaction causing severe rash involving mucus membranes or skin necrosis: Unknown Has patient had a PCN reaction that required hospitalization: Unknown Has patient had a PCN reaction occurring within the last 10 years: Unknown If all of the above answers are "NO", then may proceed with Cephalosporin use.   Medications Reviewed Today     Reviewed by [name], RN (Registered Nurse) on 07/20/22 at [phone]  Med List Status: <None>   Medication Order Taking? Sig Documenting Provider Last Dose Status Informant  ACCU-CHEK GUIDE test strip [id number]  USE TO CHECK BLOOD SUGAR 3 TIMES DAILY AS DIRECTED [name], NP  Active   Accu-Chek Softclix Lancets lancets [id number] No CHECK BLOOD SUGAR TWICE DAILY [name], NP Taking Active   albuterol (PROVENTIL) (2.5 MG/3ML) 0.083% nebulizer solution [id number] No USE 1 VIAL VIA NEBULIZER EVERY 4 HOURS AS NEEDED [name], NP Taking Active   amLODipine (  NORVASC) 5 MG tablet [id number]  TAKE 1 TABLET BY MOUTH ONCE DAILY [name], NP  Active   atorvastatin (LIPITOR) 80 MG tablet  [id number] No TAKE 1 TABLET BY MOUTH ONCE EVERY [name], NP Taking Active   Blood Glucose Monitoring Suppl (ACCU-CHEK GUIDE) w/Device KIT [id number] No Use to check blood sugar 3 times a day. [name], NP Taking Active   Blood Pressure Monitoring (OMRON 3 SERIES BP MONITOR) DEVI [id number] No Use to check blood pressure as directed [name], NP Taking Active   citalopram (CELEXA) 40 MG tablet [id number] No TAKE 1 TABLET BY MOUTH ONCE DAILY [name], NP Taking Active   diphenhydrAMINE (BENADRYL) 25 mg capsule [id number]  Take 25 mg by mouth every 6 (six) hours as needed for itching. Taking once a day [provider]  Active Self  EPINEPHrine 0.3 mg/0.3 mL IJ SOAJ injection [id number] No INJECT 1 SYRINGE INTO OUTER THIGH ONCE AS NEEDED FOR SEVERE ALLERGIC REACTION. [name], NP Taking Active   Fluticasone-Umeclidin-Vilant (TRELEGY ELLIPTA) 100-62.5-25 MCG/ACT AEPB [id number] No Inhale 1 puff into the lungs daily. [name], MD Taking Active   Fluticasone-Umeclidin-Vilant [facility] ELLIPTA) 100-62.5-25 MCG/ACT AEPB [id number] No Inhale 1 puff into the lungs daily. [name], MD Taking Active   furosemide (LASIX) 20 MG tablet [id number] No TAKE 1 TABLET BY MOUTH ONCE DAILY IN AFTERNOON AS NEEDED LEG EDEMA [name], NP Taking Active   gabapentin (NEURONTIN) 300 MG capsule [id number] No TAKE 1 CAPSULE BY MOUTH 3 TIMES DAILY ASNEEDED (NEED OFFICE VISIT FOR FURTHER REFILLS) [name], NP Taking Active   hydrALAZINE (APRESOLINE) 50 MG tablet [id number] No Take 1 tablet (50 mg total) by mouth 3 (three) times daily. [name], NP Taking Active   hydrOXYzine (VISTARIL) 25 MG capsule [id number]  Take 1 capsule (25 mg total) by mouth every 8 (eight) hours as needed. [name], NP  Active   levothyroxine (SYNTHROID) 50 MCG tablet [id number] No TAKE 1 TABLET BY MOUTH ONCE DAILY ON AN EMPTY STOMACH. WAIT 30 MINUTES BEFORE  TAKING OTHER MEDS. [name], NP Taking Active   lisinopril (ZESTRIL) 40 MG tablet [id number] No TAKE 1 TABLET BY MOUTH ONCE DAILY [name], NP Taking Active   meloxicam (MOBIC) 15 MG tablet [id number] No TAKE 1 TABLET BY MOUTH ONCE DAILY AS NEEDED WITH FOOD OR MILK [name], NP Taking Active   montelukast (SINGULAIR) 10 MG tablet [id number] No TAKE 1 TABLET BY MOUTH AT BEDTIME [name], NP Taking Active   nitroGLYCERIN (NITROSTAT) 0.4 MG SL tablet [id number] No DISSOLVE 1 TABLET UNDER TONGUE AT ONSET OF CHEST PAIN. REPEAT IN 5 MIN IF NOT RESOLVED, MAX 3 DOSES. 911 IF NEEDED. [name], NP Taking Active   omeprazole (PRILOSEC) 20 MG capsule [id number] No TAKE 1 CAPSULE BY MOUTH ONCE DAILY [name], NP Taking Active   OXYGEN [id number] No Inhale 3 L into the lungs daily. [provider] Taking Active   spironolactone (ALDACTONE) 25 MG tablet [id number] No Take 0.5 tablets (12.5 mg total) by mouth daily. [name], NP Taking Active   TRULICITY [id number] [name] [id number]  INJECT 0.75MG  SUBQ ONCE A WEEK [name], NP  Active   Vitamin D, Ergocalciferol, (DRISDOL) 1.25 MG (50000 UNIT) CAPS capsule [id number]  TAKE 1 CAPSULE BY MOUTH EVERY 7 DAYS [name], NP  Active            Patient Active Problem List   Diagnosis Date Noted   Venous  ulcer ([place]) 05/03/2022   Foot pain, right 09/01/2021   Fibrocystic disease of both breasts 05/07/2019   IFG (impaired fasting glucose) 05/03/2019   Chronic venous insufficiency 11/09/2018   Overactive bladder 09/30/2018   Peripheral neuropathy 09/30/2018   Apnea 01/02/2016   Lymphedema 11/30/2014   Dyspnea on exertion 11/30/2014   Anxiety 11/29/2014   Arthritis 11/29/2014   COPD (chronic obstructive pulmonary disease) ([place]) 11/29/2014   Clinical depression 11/29/2014   H/O eating disorder 11/29/2014   Esophageal reflux 11/29/2014   Acne inversa 11/29/2014   Essential  hypertension 11/29/2014   HLD (hyperlipidemia) 11/29/2014   Adult hypothyroidism 11/29/2014   Insomnia 11/29/2014   Low back pain 11/29/2014   Morbid obesity ([place]) 11/29/2014   Posttraumatic stress disorder 11/29/2014   Vitamin D deficiency 11/29/2014   Nicotine dependence, cigarettes, uncomplicated 0000000   Conditions to be addressed/monitored per PCP order:  Chronic healthcare management needs, LBP, OSA, GERD, HTN, DM, COPD, anxiety/depression, lymphedema, H/A, hypothyroid, arthritis, HLD,   Care Plan : RNCM: COPD (Adult)  Updates made by [name], RN since 07/20/2022 12:00 AM     Problem: RNCM: Psychological Adjustment to Diagnosis (COPD)   Priority: Medium  Onset Date: 05/22/2020     Long-Range Goal: RNCM: Adjustment to Disease Achieved   Start Date: 05/22/2020  Expected End Date: 10/20/2022  Recent Progress: On track  Priority: High  Note:   Current Barriers:  Knowledge deficits related to basic understanding of COPD disease process Knowledge deficits related to basic COPD self care/management Knowledge deficit related to basic understanding of how to use inhalers and how inhaled medications work Knowledge deficit related to importance of energy conservation Limited Social Support Unable to independently manage COPD Lacks social connections Does not contact provider office for questions/concerns  07/20/22:  patient using 3L of Oxygen, using at night and some during day-has PULM appt 4/15  Case Manager Clinical Goal(s): patient will report using inhalers as prescribed including rinsing mouth after use patient will be able to verbalize understanding of COPD action plan and when to seek appropriate levels of medical care  patient will engage in lite exercise as tolerated to build/regain stamina and strength and reduce shortness of breath through activity tolerance  patient will verbalize basic understanding of COPD disease process and self care activities   Interventions:  Inter-disciplinary care team collaboration (see longitudinal plan of care) UNABLE to independently: manage COPD Provided patient with basic written and verbal COPD education on self care/management/and exacerbation prevention Provided patient with COPD action plan and reinforced importance of daily self assessment.  Provided written and verbal instructions on pursed lip breathing and utilized returned demonstration as teach back Provided instruction about proper use of medications used for management of COPD including inhalers Advised patient to self assesses COPD action plan zone and make appointment with provider if in the yellow zone for 48 hours without improvement. Provided patient with education about the role of exercise in the management of COPD Advised patient to engage in light exercise as tolerated 3-5 days a week Provided education about and advised patient to utilize infection prevention strategies to reduce risk of respiratory infection. Patient Goals/Self-Care Activities:  - decision-making supported - depression screen reviewed - emotional support provided - family involvement promoted - problem-solving facilitated - relaxation techniques promoted - verbalization of feelings encouraged Follow Up Plan: Telephone follow up appointment with care management team member scheduled.   Care Plan : RNCM: Depression (Adult)  Updates made by [name], RN since  07/20/2022 12:00 AM     Problem: RNCM: Symptoms (Depression)   Priority: High  Onset Date: 12/02/2020     Long-Range Goal: RNCM: Symptoms Monitored and Managed   Start Date: 12/02/2020  Expected End Date: 05/21/2023  Recent Progress: Not on track  Priority: High  Note:   Current Barriers:  Ineffective Self Health Maintenance in a patient with Anxiety and Depression Unable to independently manage depression and anxiety Does not attend all scheduled provider appointments Lacks social  connections Unable to perform IADLs independently Does not contact provider office for questions/concerns 07/20/22:  LCSW following Clinical Goal(s):  Collaboration with [name], NP regarding development and update of comprehensive plan of care as evidenced by provider attestation and co-signature Inter-disciplinary care team collaboration (see longitudinal plan of care) patient will work with care management team to address care coordination and chronic disease management needs related to Disease Management Educational Needs Care Coordination Mental Health Counseling Level of Care Concerns   Interventions:  Evaluation of current treatment plan related to Anxiety and Depression, Financial constraints related to insurance not approving care for the patient to go to wound care , Limited social support, Level of care concerns, ADL IADL limitations, Mental Health Concerns , Family and relationship dysfunction, Substance abuse issues -   , and Inability to perform IADL's independently self-management and patient's adherence to plan as established by provider. Inter-disciplinary care team collaboration (see longitudinal plan of care) Discussed plans with patient for ongoing care management follow up and provided patient with direct contact information for care management team Evaluation of depression and anxiety.  LCSW referral for increased stress-completed Collaborated with LCSW  Self Care Activities:  Patient verbalizes understanding of plan to effectively manage depression and anxiety Self administers medications as prescribed Attends all scheduled provider appointments Calls pharmacy for medication refills Attends church or other social activities Performs ADL's independently Performs IADL's independently Calls provider office for new concerns or questions Work with the [facility] team to meet needs for continued CCM services  Patient Goals: - activity or exercise based on tolerance  encouraged - depression screen reviewed - emotional support provided - healthy lifestyle promoted - medication side effects monitored and managed - pain managed - participation in mental health treatment encouraged - quality of sleep assessed - response to mental health treatment monitored - response to pharmacologic therapy monitored - sleep hygiene techniques encouraged - social activities and relationships encouraged - substance use assessed Follow Up Plan: The care management team will reach out to the patient again over the next 60 business days.   Follow Up:  Patient agrees to Care Plan and Follow-up.  Plan: The Managed Medicaid care management team will reach out to the patient again over the next 60 business  days. and The  Patient has been provided with contact information for the Managed Medicaid care management team and has been advised to call with any health related questions or concerns.  Date/time of next scheduled RN care management/care coordination outreach:  09/18/22 at 0900.

## 2022-07-20 NOTE — Patient Instructions (Signed)
Visit Information  Cheryl Chandler was given information about Medicaid Managed Care team care coordination services as a part of their [facility] Medicaid benefit. Cheryl Chandler verbally consented to engagement with the [facility] Managed Care team.   If you are experiencing a medical emergency, please call 911 or report to your local emergency department or urgent care.   If you have a non-emergency medical problem during routine business hours, please contact your provider's office and ask to speak with a nurse.   For questions related to your [facility] health plan, please call: [phone] or go here:[email or web address]  If you would like to schedule transportation through your [facility] plan, please call the following number at least 2 days in advance of your appointment: [phone].  You can also use the MTM portal or MTM mobile app to manage your rides. For the portal, please go to [email or web address].  Call the [facility] at [phone], at any time, 24 hours a day, 7 days a week. If you are in danger or need immediate medical attention call 911.  If you would like help to quit smoking, call 1-800-QUIT-NOW 947-493-6777) OR Espaol: 1-855-Djelo-Ya HD:1601594) o para ms informacin haga clic aqu or Text READY to 200-400 to register via text  Cheryl Chandler - following are the goals we discussed in your visit today:   Goals Addressed             This Visit's Progress    RNCM: Keep Skin Clean and Dry       Follow Up Date: 09/18/22  - clean and dry skin well - keep skin dry - use a fragrance-free lotion on skin - wear a protective pad or garment    Why is this important?   Leaking urine (pee) can cause soreness from skin rashes and redness.  This is caused by skin being exposed to urine (pee).   07/20/22:  continues with benadryl-PCP appt 4/8, DERM in Nov     RNCM: Track and Manage My Symptoms-Depression       Timeframe:   Long-Range Goal Priority:  High Start Date:    12-02-2020                         Expected End Date:   ongoing                Follow Up Date: 09/18/22   - avoid negative self-talk - develop a personal safety plan - develop a plan to deal with triggers like holidays, anniversaries - exercise at least 2 to 3 times per week - have a plan for how to handle bad days - journal feelings and what helps to feel better or worse - spend time or talk with others at least 2 to 3 times per week - spend time or talk with others every day - watch for early signs of feeling worse    Why is this important?   Keeping track of your progress will help your treatment team find the right mix of medicine and therapy for you.  Write in your journal every day.  Day-to-day changes in depression symptoms are normal. It may be more helpful to check your progress at the end of each week instead of every day.   07/20/22:  Followed by LCSW     RNCM: Track and Manage My Triggers-COPD       Timeframe:  Long-Range Goal Priority:  Medium Start Date:  05-22-2020                        Expected End Date:   ongoing          Follow Up Date: 09/18/22   - avoid second hand smoke - eliminate smoking in my home - identify and avoid work-related triggers - identify and remove indoor air pollutants - limit outdoor activity during cold weather - listen for public air quality announcements every day    Why is this important?   Triggers are activities or things, like tobacco smoke or cold weather, that make your COPD (chronic obstructive pulmonary disease) flare-up.  Knowing these triggers helps you plan how to stay away from them.  When you cannot remove them, you can learn how to manage them.   07/20/22:  Oxygen 3L-uses day and night   Patient verbalizes understanding of instructions and care plan provided today and agrees to view in [place]. Active MyChart status and patient understanding of how to access instructions  and care plan via MyChart confirmed with patient.     The Managed Medicaid care management team will reach out to the patient again over the next 30 business  days.  The  Patient  has been provided with contact information for the Managed Medicaid care management team and has been advised to call with any health related questions or concerns.   Aida Raider RN, BSN [place]  [facility] [employer] - Managed Medicaid High Risk [phone].   Following is a copy of your plan of care:  Care Plan : RNCM: COPD (Adult)  Updates made by [name], RN since 07/20/2022 12:00 AM     Problem: RNCM: Psychological Adjustment to Diagnosis (COPD)   Priority: Medium  Onset Date: 05/22/2020     Long-Range Goal: RNCM: Adjustment to Disease Achieved   Start Date: 05/22/2020  Expected End Date: 10/20/2022  Recent Progress: On track  Priority: High  Note:   Current Barriers:  Knowledge deficits related to basic understanding of COPD disease process Knowledge deficits related to basic COPD self care/management Knowledge deficit related to basic understanding of how to use inhalers and how inhaled medications work Knowledge deficit related to importance of energy conservation Limited Social Support Unable to independently manage COPD Lacks social connections Does not contact provider office for questions/concerns  07/20/22:  patient using 3L of Oxygen, using at night and some during day-has PULM appt 4/15  Case Manager Clinical Goal(s): patient will report using inhalers as prescribed including rinsing mouth after use patient will be able to verbalize understanding of COPD action plan and when to seek appropriate levels of medical care  patient will engage in lite exercise as tolerated to build/regain stamina and strength and reduce shortness of breath through activity tolerance  patient will verbalize basic understanding of COPD disease process and self care  activities  Interventions:  Inter-disciplinary care team collaboration (see longitudinal plan of care) UNABLE to independently: manage COPD Provided patient with basic written and verbal COPD education on self care/management/and exacerbation prevention Provided patient with COPD action plan and reinforced importance of daily self assessment.  Provided written and verbal instructions on pursed lip breathing and utilized returned demonstration as teach back Provided instruction about proper use of medications used for management of COPD including inhalers Advised patient to self assesses COPD action plan zone and make appointment with provider if in the yellow zone for 48 hours without improvement. Provided  patient with education about the role of exercise in the management of COPD Advised patient to engage in light exercise as tolerated 3-5 days a week Provided education about and advised patient to utilize infection prevention strategies to reduce risk of respiratory infection. Patient Goals/Self-Care Activities:  - decision-making supported - depression screen reviewed - emotional support provided - family involvement promoted - problem-solving facilitated - relaxation techniques promoted - verbalization of feelings encouraged Follow Up Plan: Telephone follow up appointment with care management team member scheduled.   Care Plan : RNCM: Depression (Adult)  Updates made by [name], RN since 07/20/2022 12:00 AM     Problem: RNCM: Symptoms (Depression)   Priority: High  Onset Date: 12/02/2020     Long-Range Goal: RNCM: Symptoms Monitored and Managed   Start Date: 12/02/2020  Expected End Date: 05/21/2023  Recent Progress: Not on track  Priority: High  Note:   Current Barriers:  Ineffective Self Health Maintenance in a patient with Anxiety and Depression Unable to independently manage depression and anxiety Does not attend all scheduled provider appointments Lacks social  connections Unable to perform IADLs independently Does not contact provider office for questions/concerns 07/20/22:  LCSW following Clinical Goal(s):  Collaboration with [name], NP regarding development and update of comprehensive plan of care as evidenced by provider attestation and co-signature Inter-disciplinary care team collaboration (see longitudinal plan of care) patient will work with care management team to address care coordination and chronic disease management needs related to Disease Management Educational Needs Care Coordination Mental Health Counseling Level of Care Concerns   Interventions:  Evaluation of current treatment plan related to Anxiety and Depression, Financial constraints related to insurance not approving care for the patient to go to wound care , Limited social support, Level of care concerns, ADL IADL limitations, Mental Health Concerns , Family and relationship dysfunction, Substance abuse issues -   , and Inability to perform IADL's independently self-management and patient's adherence to plan as established by provider. Inter-disciplinary care team collaboration (see longitudinal plan of care) Discussed plans with patient for ongoing care management follow up and provided patient with direct contact information for care management team Evaluation of depression and anxiety.  LCSW referral for increased stress-completed Collaborated with LCSW  Self Care Activities:  Patient verbalizes understanding of plan to effectively manage depression and anxiety Self administers medications as prescribed Attends all scheduled provider appointments Calls pharmacy for medication refills Attends church or other social activities Performs ADL's independently Performs IADL's independently Calls provider office for new concerns or questions Work with the [facility] team to meet needs for continued CCM services  Patient Goals: - activity or exercise based on tolerance  encouraged - depression screen reviewed - emotional support provided - healthy lifestyle promoted - medication side effects monitored and managed - pain managed - participation in mental health treatment encouraged - quality of sleep assessed - response to mental health treatment monitored - response to pharmacologic therapy monitored - sleep hygiene techniques encouraged - social activities and relationships encouraged - substance use assessed Follow Up Plan: The care management team will reach out to the patient again over the next 60 business days.

## 2022-07-24 NOTE — Telephone Encounter (Signed)
Encounter opened in error. Will close encounter. 

## 2022-07-25 DIAGNOSIS — G4736 Sleep related hypoventilation in conditions classified elsewhere: Secondary | ICD-10-CM | POA: Diagnosis not present

## 2022-07-27 DIAGNOSIS — Z419 Encounter for procedure for purposes other than remedying health state, unspecified: Secondary | ICD-10-CM | POA: Diagnosis not present

## 2022-07-28 ENCOUNTER — Other Ambulatory Visit: Payer: Medicaid Other | Admitting: Licensed Clinical Social Worker

## 2022-07-28 NOTE — Patient Outreach (Signed)
Medicaid Managed Care Social Work Note  07/28/2022 Name:  Cheryl Chandler MRN:  [id number] DOB:  04-26-1966  Cheryl Chandler is an 57 y.o. year old female who is a primary patient of [name], NP.  The Medicaid Managed Care Coordination team was consulted for assistance with:  [place] and Resources  Ms. Holthus was given information about Medicaid Managed Care Coordination team services today. [place] Patient agreed to services and verbal consent obtained.  Engaged with patient  for by telephone forfollow up visit in response to referral for case management and/or care coordination services.   Assessments/Interventions:  Review of past medical history, allergies, medications, health status, including review of consultants reports, laboratory and other test data, was performed as part of comprehensive evaluation and provision of chronic care management services.  SDOH: (Social Determinant of Health) assessments and interventions performed: [name]    Flowsheet Row Patient Outreach Telephone from 07/28/2022 in [place] Patient Outreach Telephone from 07/20/2022 in [place] Patient Outreach Telephone from 06/25/2022 in [place] Patient Outreach Telephone from 06/18/2022 in [place] Patient Outreach Telephone from 05/21/2022 in [place] Office Visit from 05/01/2022 in [place]  [name]        Housing Interventions -- -- -- Intervention Not Indicated -- --  Utilities Interventions -- -- -- Intervention Not Indicated -- --  Alcohol Usage Interventions -- Intervention Not Indicated (Score <7) -- -- -- --  Depression Interventions/Treatment  -- -- -- -- -- Medication, Currently on Treatment  Financial Strain Interventions -- -- -- -- Other (Comment)   [referrals placed -BSW assisting] --  Physical Activity Interventions -- -- -- -- Other (Comments)  [not physically able to] --  Stress Interventions [employer], Provide Counseling -- Intervention Not Indicated -- -- --       Advanced Directives Status:  See Care Plan for related entries.  Care Plan                 Allergies  Allergen Reactions   Codeine Shortness Of Breath and Rash   Percocet [Oxycodone-Acetaminophen] Shortness Of Breath   Sulfa Antibiotics Shortness Of Breath and Rash   Aspirin Hives   Bee Venom     Bee stings   Darvon [Propoxyphene]     Darvocet-N 100   Latex    Oxycodone Nausea And Vomiting   Penicillins     Has patient had a PCN reaction causing immediate rash, facial/tongue/throat swelling, SOB or lightheadedness with hypotension: Unknown Has patient had a PCN reaction causing severe rash involving mucus membranes or skin necrosis: Unknown Has patient had a PCN reaction that required hospitalization: Unknown Has patient had a PCN reaction occurring within the last 10 years: Unknown If all of the above answers are "NO", then may proceed with Cephalosporin use.    Medications Reviewed Today     Reviewed by [name], RN (Registered Nurse) on 07/20/22 at [phone]  Med List Status: <None>   Medication Order Taking? Sig Documenting Provider Last Dose Status Informant  ACCU-CHEK GUIDE test strip [id number]  USE TO CHECK BLOOD SUGAR 3 TIMES DAILY AS DIRECTED [name], NP  Active   Accu-Chek Softclix Lancets lancets [id number] No CHECK BLOOD SUGAR TWICE DAILY [name], NP Taking Active   albuterol (PROVENTIL) (2.5 MG/3ML) 0.083% nebulizer solution [id number] No USE 1 VIAL VIA NEBULIZER EVERY  4 HOURS AS NEEDED [name], NP Taking Active   amLODipine (NORVASC) 5 MG tablet [id number]  TAKE 1 TABLET BY MOUTH ONCE DAILY [name], NP  Active   atorvastatin (LIPITOR) 80 MG tablet [id number] No TAKE 1 TABLET BY  MOUTH ONCE EVERY [name], NP Taking Active   Blood Glucose Monitoring Suppl (ACCU-CHEK GUIDE) w/Device KIT [id number] No Use to check blood sugar 3 times a day. [name], NP Taking Active   Blood Pressure Monitoring (OMRON 3 SERIES BP MONITOR) DEVI [id number] No Use to check blood pressure as directed [name], NP Taking Active   citalopram (CELEXA) 40 MG tablet [id number] No TAKE 1 TABLET BY MOUTH ONCE DAILY [name], NP Taking Active   diphenhydrAMINE (BENADRYL) 25 mg capsule [id number]  Take 25 mg by mouth every 6 (six) hours as needed for itching. Taking once a day [provider]  Active Self  EPINEPHrine 0.3 mg/0.3 mL IJ SOAJ injection [id number] No INJECT 1 SYRINGE INTO OUTER THIGH ONCE AS NEEDED FOR SEVERE ALLERGIC REACTION. [name], NP Taking Active   Fluticasone-Umeclidin-Vilant (TRELEGY ELLIPTA) 100-62.5-25 MCG/ACT AEPB [id number] No Inhale 1 puff into the lungs daily. [name], MD Taking Active   Fluticasone-Umeclidin-Vilant [facility] ELLIPTA) 100-62.5-25 MCG/ACT AEPB [id number] No Inhale 1 puff into the lungs daily. [name], MD Taking Active   furosemide (LASIX) 20 MG tablet [id number] No TAKE 1 TABLET BY MOUTH ONCE DAILY IN AFTERNOON AS NEEDED LEG EDEMA [name], NP Taking Active   gabapentin (NEURONTIN) 300 MG capsule [id number] No TAKE 1 CAPSULE BY MOUTH 3 TIMES DAILY ASNEEDED (NEED OFFICE VISIT FOR FURTHER REFILLS) [name], NP Taking Active   hydrALAZINE (APRESOLINE) 50 MG tablet [id number] No Take 1 tablet (50 mg total) by mouth 3 (three) times daily. [name], NP Taking Active   hydrOXYzine (VISTARIL) 25 MG capsule [id number]  Take 1 capsule (25 mg total) by mouth every 8 (eight) hours as needed. [name], NP  Active   levothyroxine (SYNTHROID) 50 MCG tablet [id number] No TAKE 1 TABLET BY MOUTH ONCE DAILY ON AN EMPTY STOMACH. WAIT 30 MINUTES BEFORE TAKING OTHER MEDS. [name], NP Taking Active   lisinopril (ZESTRIL) 40 MG tablet [id number] No TAKE 1 TABLET BY MOUTH ONCE DAILY [name], NP Taking Active   meloxicam (MOBIC) 15 MG tablet [id number] No TAKE 1 TABLET BY MOUTH ONCE DAILY AS NEEDED WITH FOOD OR MILK [name], NP Taking Active   montelukast (SINGULAIR) 10 MG tablet [id number] No TAKE 1 TABLET BY MOUTH AT BEDTIME [name], NP Taking Active   nitroGLYCERIN (NITROSTAT) 0.4 MG SL tablet [id number] No DISSOLVE 1 TABLET UNDER TONGUE AT ONSET OF CHEST PAIN. REPEAT IN 5 MIN IF NOT RESOLVED, MAX 3 DOSES. 911 IF NEEDED. [name], NP Taking Active   omeprazole (PRILOSEC) 20 MG capsule [id number] No TAKE 1 CAPSULE BY MOUTH ONCE DAILY [name], NP Taking Active   OXYGEN [id number] No Inhale 3 L into the lungs daily. [provider] Taking Active   spironolactone (ALDACTONE) 25 MG tablet [id number] No Take 0.5 tablets (12.5 mg total) by mouth daily. [name], NP Taking Active   TRULICITY [id number] [name] [id number]  INJECT 0.75MG  SUBQ ONCE A WEEK [name], NP  Active   Vitamin D, Ergocalciferol, (DRISDOL) 1.25 MG (50000 UNIT) CAPS capsule [id number]  TAKE 1 CAPSULE BY MOUTH EVERY 7 DAYS [name], NP  Active  Patient Active Problem List   Diagnosis Date Noted   Venous ulcer 05/03/2022   Foot pain, right 09/01/2021   Fibrocystic disease of both breasts 05/07/2019   IFG (impaired fasting glucose) 05/03/2019   Chronic venous insufficiency 11/09/2018   Overactive bladder 09/30/2018   Peripheral neuropathy 09/30/2018   Apnea 01/02/2016   Lymphedema 11/30/2014   Dyspnea on exertion 11/30/2014   Anxiety 11/29/2014   Arthritis 11/29/2014   COPD (chronic obstructive pulmonary disease) 11/29/2014   Clinical depression 11/29/2014   H/O eating disorder 11/29/2014   Esophageal reflux 11/29/2014   Acne inversa 11/29/2014   Essential hypertension 11/29/2014   HLD (hyperlipidemia)  11/29/2014   Adult hypothyroidism 11/29/2014   Insomnia 11/29/2014   Low back pain 11/29/2014   Morbid obesity 11/29/2014   Posttraumatic stress disorder 11/29/2014   Vitamin D deficiency 11/29/2014   Nicotine dependence, cigarettes, uncomplicated 0000000    Conditions to be addressed/monitored per PCP order:  Depression  Care Plan : General Social Work (Adult)  Updates made by [name], LCSW since 07/28/2022 12:00 AM     Problem: Coping Skills (General Plan of Care)      Long-Range Goal: Coping Skills Enhanced   Start Date: 11/07/2020  Expected End Date: 03/07/2022  Recent Progress: On track  Priority: High  Note:   Timeframe:  Long-Range Goal Priority:  High Start Date:       08/26/21                    Expected End Date:  ongoing   Current Barriers:  Financial constraints Mental Health Concerns  Social Isolation Limited education about mental health support resources that are available to her within the local area* Cognitive Deficits Lacks knowledge of community resource   Clinical Social Work Clinical Goal(s):  Over the next 90 days, client will work with SW to address concerns related to experiencing ongoing symptoms of depression, sadness and grief since the passing of her mother Over the next 90 days, patient will work with LCSW to address needs related to implementing appropriate self-care and depression management.    Interventions: Patient interviewed and appropriate assessments performed Provided mental health counseling with regards to patient's loss and ongoing stressors. Patient was very close with her mother who passed away a few years ago and she continues to experience symptoms of grief from this loss. Patient denies wanting LCSW to make a referral for grief counseling through [facility] again but was receptive to coping skill and resource education/information that was provided during session today. Patient reports decorating for the holidays helps  her to connect with her mother as they did this together when she was alive.  Provided patient with information about managing depression within her daily life. Patient has ongoing grief symptoms as well. Patient states "I feel worn down." Patient reports that she talks out loud to her mother's picture and this is very helpful to her. Grief support resource education provided again during session today.  Provided reflective listening and implemented appropriate interventions to help support patient and her emotional needs  Discussed plans with patient for ongoing care management follow up and provided patient with direct contact information for [facility] team Advised patient to contact [facility] team with any urgent case management needs. Assisted patient/caregiver with obtaining information about health plan benefits LCSW completed past referral for family to have a wheelchair ramp built at their residence due to ongoing mobility concerns. Ramp has been successfully installed.  PCS actively involved with  patient and providing ongoing services.  Positive reinforcement provided to patient for quitting smoking cigarettes. Coping skill review education provided for future triggers.  Brief self-care education provided to both patient and spouse. Patient confirms that she continues to go to food pantry for food insecurity. Resource information has been provided.  Patient reports ongoing pain and issues with mobility. Patient shares that her pain resides in her pains and legs. Emotional support and coping skill education provided. Patient confirms that her PCS aide continues to provide her with services. However, aide has to be reminded to put on her mask since patient is high risk. LCSW reviewed resources that [name] provided her with in the past.  Patient reports that she had a wonderful birthday spent with her family this past May. Patient reports receiving appropriate socialization. Patient reports that her spouse  took her out to eat and gifted her with several things which was a nice surprise.  Patient confirms stable transportation arrangements for upcoming PCP appointment this week.  Patient repots that she is losing a lot of weight but is not sure how much she has lost as she does not have a scale. Patient reports that a goal of hers is to lose weight but she has not been intentionally losing weight at this time.  Patient reports that her aide [name] continues to assist her with PCS and is an excellent support within the home for her and spouse.  Patient planted a tomato plant and successfully grew over 5 tomatoes over the summer. Patient is very proud of this achievement as this was her first time planting a tomato plant and is now interested in picking up gardening/planting as a hobby. Patient reports that her aunt continues to be a positive support for her in her life and someone that she can rely on in the case of emergencies.  Patient was educated on healthy self-care skills and advised her to use these into her daily routine.  04/10/21- Update- Patient is agreeable to social work closure at this time as she still does not need medication management or counseling as she is managing her mental health well at this time. Patient talks to her younger sister everyday to gain emotional support. Patient says that her sister is into body building and helps to educate and remind her to do her own self-care.  Patient reports that she has been elevating her legs daily to elevate pain and swelling.  [facility] LCSW completed care coordination with [place] on 01/15/21 and [facility] LCSW provided the following suggested resource-It is likely their best option given their loss of housing and lack of consistent income:   Publishing rights manager                                                                                                                       (Emergency [place]) [phone] Ext.  104 M-F, 8am - 4pm Patient was successfully approved for food stamps but is still experiencing financial concerns.  She reports that she and her husband have no clothes that fit them and they are having to start using a string for their pants and patient only has one bra at this time. [facility] LCSW will send referral to [facility] BSW today for financial support.  BSW spoke with patient about resources needed. Patient states she does receive SSI and was approved for foodstamps. Patient is familiar with all of the food pantries and other resources in [place]. BSW will assist patient with finding an eye doctor. Patient states she has not been in 10 years. BSW will research and put a list of eye doctors together that accept Medicaid in [facility]. 04/30/20: BSW contacted and spoke with patient, she stated she was not doing good because she found out that she has a new landlord and they are going up on her rent. BSW informed patient to make sure she informs her social workers at [employer] that they have an increase for foodstamps and Medicaid. Patient stated that she sleeps on the couch and is in need of a power recliner/lift chair. BSW will send patients PCP a message asking for a script to be sent. 05/21/21: BSW completed follow up telephone call with patient. She states she is unsure if their new landlord has received their January rent. Patient states she is also worried due to the new landlord making them pay rent online and on the 1st of the month. Patient states she is unable to get anyone on the phone. Patient states her rent will go up to 400 from 255 in March. Patient states she does not have many clothes but goes to [employer] to get some Materials engineer. No other resources needed at this time.   06/20/21: BSW completed follow up telelphone call with patient. Patient states she is doing well. Patient states [employer] [place] will cover all of Vaccines. Patient states no resources are needed at this time. 08/26/21:  BSW completed telephone outreach with patient, she stated since going to the hospital she has been feeling weak. Patient states other than that she is feeling okay. Patient stated she is going to get a new phone on Wednesday. Patient states her birthday is on Friday and her husband may take her to a [place]. No other resources are needed at this time.  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 Car is doing better, they are still able to drive and get around. Patient states husbands dad has been helping them with there transportation and bills. They landlord has gone up to 600 on lot rent, patient states they are going to have to sell there home and move.  Update- Patient reports experiencing additional grief lately. [facility] LCSW offered referral for berievement counseling but she declined. UPDATE 08/26/21- Patient reports that she has a near death experience last month. Patient shares that she had a fall. 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 [facility] LCSW completed education on available mental health resources within her area to utilize. Patient admits to experiencing some sadness due to grief from her mother's passing. Emotional support provided. 10/13/21 UPDATE- Patient reports that she is working hard on living a healthier life both physically and mentally. Patient reports that she is making active changes in her diet. She reports that she eventually wishes to quit smoking again and has started picking up  new hobbies to implement during times of craving such as coloring, crocheting and being creative. Patient reports that she is unsure if she needs mental health support at this time because her main concerns are centered around her and her spouse's physical health instead their mental health. [facility] LCSW educated patient on how they affect  one another and their importance. Patient denies any urgent concerns. She reports no issues with affording food due to food stamp assistance. [facility] LCSW will reassess social work needs 30 days from now. UPDATE 11/17/21- Patient reports that she is experiencing several stressors at this time. She was encouraged to consider mental health treatment but she is not interested at this time. Patient was encouraged to consider mental health resources. Patient reports that their car is in the shop and that they have no stable transportation besides their sister and her husband's father. Patient reports that her spouse has had several recent health issues that have affected her mental state. Patient was educated on healthy coping skills for stress. Patient denies no urgent concerns or issues at this time. UPDATE- Care Coordination completed with [facility] LCSW, PCP office, Pharmacist, [facility] BSW and PCP. Patient has canceled several medical appointments because they are unable to afford a copay. Patient will benefit from financial assistance and may benefit from a [facility] application. Patient has an upcoming appointment with [facility] BSW next week. Patient is agreeable to exploring financial assistance options with [facility] BSW that will help alleviate other areas of financial stress within their life in order for patient to be able to afford medical expenses. [facility] LCSW encouraged patient to consider individual counseling. Patient is agreeable to referral for counseling but needs to find out her sister's email first as she is unable to go to office to fill out intake paperwork. Patient prefers virtual counseling to save money on transportation. [facility] LCSW will successfully place referral to [facility] during next outreach. [facility] LCSW completed care coordination with [facility] BSW.  BSW 12/15/21: BSW completed a telephone outreach with patient. She stated everything is still going okay. She has been having problems with her leg wraps and has an  appointment today. She states her husband is still having issues with his eye and with the doctor and she is going to make a compliant. Patient states no resources are needed at this time. [facility] LCSW 01/19/22: Patient reports that she was unable to sleep last night. She is experiencing a break out skin reaction that she feels is caused by stress. Her financial stressors have increased since last session. Patient is agreeable to [facility] LCSW placing referral for [facility] now but reports that she has no email address. [facility] LCSW completed referral and made note of her inability to get into her email. [facility] LCSW will follow up within two weeks to ensure that she was enrolled in [facility] counseling program. [facility] completed care coordination call ONLY to [facility] and they were unable to locate patient's referral that was made on 01/19/22. [facility] LCSW resubmitted this referral today on 01/29/22 for telephonic counseling. Patient was made aware by telephone call that [facility] will contact her by the end of the day tomorrow to schedule a time for her to come into their office to complete in person intake paperwork. Patient is agreeable to this plan. UPDATE 02/03/22- Patient reports that she missed a call from June the receptionist at [place] and call her back but has not heard back yet. [facility] LCSW provided patient with  her number and directions on which prompt to enter to be directed to June. Patient will ask June about her phone as patient is unsure if she can make video calls and complete telephonic therapy which would mean she would need to go for in person sessions. Patient is agreeable to plan. Patient was able to find an assistance program for rent through [facility] on her own. Positive reinforcement provided for this resource find. [facility] LCSW collaborated with entire team. [facility] LCSW will follow up in two weeks to ensure that patient was able to get connected with either telephonic or  in person therapy services at [facility]. UPDATE 02/23/22-Patient has decided to decline therapy at this time stating that she "just has too much going on to add in anything else into her schedule." [facility] LCSW will update team on this decision. Reflective and active listening provided during session. [facility] LCSW will follow up in 90 days to ask patient again about mental health support involvement. Patient is agreeable to this plan. 04/24/22- Patient reports that she is still experiencing a skin reaction from off brand dish soap that she used. She reports that this has been going on for months. Healthy self-care education provided to patient to promote healthy hygiene. Patient reports that she is still not interested in counseling right now but confirms that she is stable. She reports that she is now on oxygen which she started back in the fall of 2023. She shares that she has NOT been smoking. Positive reinforcement provided for this. Patient reports that she put up two Christmas trees this year and one was her mother's. Patient reports that her mother's Christmas tree serves as a reminder of the love and special relationship that she had with her mother. [facility] LCSW 06/25/22 update- Patient reports that she is stable and her emotions/feelings have been more manageable lately but she and her spouse are still struggling financially. Patient still declines wanting a referral for a long term therapist so that she can help manage these financial stressors that trigger her stress. Patient is aware of [facility] resource that she was very close to starting therapy with in 2023. Stress management education provided. Patient is agreeable to a 60 day follow up call. 07/28/22- Patient shares that her depression has lifted recently but she is worried as the anniversary of her moms' passing is coming soon. [facility] LCSW provided reeducation on her available resources. She was educated on healthy self-care and reports that she is  able to get outside today to get some Vitamin D and spend time with her nieces, nephews and sister. Positive reinforcement provided.  Past BSW update-BSW completed a telephone outreach with patient. She stated she is still itching really bad and thinks she has shingles. She has put up her Christmas decorations and has 2 trees. Patient states she has been stressed about medical bills, but the holidays are keeping her in good spirits. Patient states they are still having financial issues.  BSW completed a telephone outreach with patient. She stated she is doing well. They were able to go to a wholesale store in [place] and get some food. Patient stated she has some appointments coming up including the skin doctor but that is not until November. Patient stated she is on the list for a sooner appointment if someone cancels. BSW contacted other skin centers in the area and they do not accept patients insurance. No other resources are needed at this time. BSW completed a telephone outreach with patient,  she stated they may start looking for somewhere else to live due to the rent rising in. Patient stated they are okay, and surviving. Patient stated no resources are needed at this time.  Managing Loss, Adult People experience loss in many different ways throughout their lives. Events such as moving, changing jobs, and losing friends can create a sense of loss. The loss may be as serious as a major health change, divorce, death of a pet, or death of a loved one. All of these types of loss are likely to create a physical and emotional reaction known as grief. Grief is the result of a major change or an absence of something or someone that you count on. Grief is a normal reaction to loss. A variety of factors can affect your grieving experience, including: The nature of your loss. Your relationship to what or whom you lost. Your understanding of grief and how to manage it. Your support system. How to manage  lifestyle changes Keep to your normal routine as much as possible. If you have trouble focusing or doing normal activities, it is acceptable to take some time away from your normal routine. Spend time with friends and loved ones. Eat a healthy diet, get plenty of sleep, and rest when you feel tired. How to recognize changes  The way that you deal with your grief will affect your ability to function as you normally do. When grieving, you may experience these changes: Numbness, shock, sadness, anxiety, anger, denial, and guilt. Thoughts about death. Unexpected crying. A physical sensation of emptiness in your stomach. Problems sleeping and eating. Tiredness (fatigue). Loss of interest in normal activities. Dreaming about or imagining seeing the person who died. A need to remember what or whom you lost. Difficulty thinking about anything other than your loss for a period of time. Relief. If you have been expecting the loss for a while, you may feel a sense of relief when it happens. Follow these instructions at home:    Activity Express your feelings in healthy ways, such as: Talking with others about your loss. It may be helpful to find others who have had a similar loss, such as a support group. Writing down your feelings in a journal. Doing physical activities to release stress and emotional energy. Doing creative activities like painting, sculpting, or playing or listening to music. Practicing resilience. This is the ability to recover and adjust after facing challenges. Reading some resources that encourage resilience may help you to learn ways to practice those behaviors. General instructions Be patient with yourself and others. Allow the grieving process to happen, and remember that grieving takes time. It is likely that you may never feel completely done with some grief. You may find a way to move on while still cherishing memories and feelings about your loss. Accepting your loss  is a process. It can take months or longer to adjust. Keep all follow-up visits as told by your health care provider. This is important. Where to find support To get support for managing loss: Ask your health care provider for help and recommendations, such as grief counseling or therapy. Think about joining a support group for people who are managing a loss. Where to find more information You can find more information about managing loss from: American Society of Clinical Oncology: www.cancer.net American Psychological Association: TVStereos.ch Contact a health care provider if: Your grief is extreme and keeps getting worse. You have ongoing grief that does not improve. Your body shows symptoms  of grief, such as illness. You feel depressed, anxious, or lonely. Get help right away if: You have thoughts about hurting yourself or others. If you ever feel like you may hurt yourself or others, or have thoughts about taking your own life, get help right away. You can go to your nearest emergency department or call: Your local emergency services (911 in the [place] A suicide crisis helpline, such as the [place] at 3642781653. This is open 24 hours a day. Summary Grief is the result of a major change or an absence of someone or something that you count on. Grief is a normal reaction to loss. The depth of grief and the period of recovery depend on the type of loss and your ability to adjust to the change and process your feelings. Processing grief requires patience and a willingness to accept your feelings and talk about your loss with people who are supportive. It is important to find resources that work for you and to realize that people experience grief differently. There is not one grieving process that works for everyone in the same way. Be aware that when grief becomes extreme, it can lead to more severe issues like isolation, depression, anxiety, or suicidal  thoughts. Talk with your health care provider if you have any of these issues. This information is not intended to replace advice given to you by your health care provider. Make sure you discuss any questions you have with your health care provider. Document Revised: 06/17/2018 Document Reviewed: 08/27/2016 Elsevier Patient Education  [place] for Managing Grief   When you are experiencing grief, many resources and support are available to help you. You are not alone.    Grief Share (https://www.https://jacobson-moore.net/):    [employer] [facility] [place], [place]  [phone] [address] [place], [place], [place], [place], [place]   [facility] [address] [place], [place]   [place], [place]   If a Hospice or [place] team has been involved in the care of your loved one, you may reach out to your contact there or locally, you may reach out to [facility]:    [facility]osp[facility] and [place] [facility]   [place], [place] [address] [phone]  Patient Self Care Activities:  Attends all scheduled provider appointments Calls provider office for new concerns or questions  Please see past updates related to this goal by clicking on the "Past Updates" button in the selected goal        Follow up:  Patient agrees to Care Plan and Follow-up.  Plan: The Managed Medicaid care management team will reach out to the patient again over the next 30 days.  Date/time of next scheduled Social Work care management/care coordination outreach:  08/24/22 at 3 pm.  Eula Fried, [place], MSW, [place]  Medicaid LCSW [email or web address] Phone: [phone]

## 2022-07-28 NOTE — Patient Instructions (Signed)
Visit Information  Cheryl Chandler was given information about Medicaid Managed Care team care coordination services as a part of their [facility] Medicaid benefit. Cheryl Chandler verbally consented to engagement with the [facility] Managed Care team.   If you are experiencing a medical emergency, please call 911 or report to your local emergency department or urgent care.   If you have a non-emergency medical problem during routine business hours, please contact your provider's office and ask to speak with a nurse.   For questions related to your [facility] health plan, please call: [phone] or go here:[email or web address]  If you would like to schedule transportation through your [facility] plan, please call the following number at least 2 days in advance of your appointment: [phone].  You can also use the MTM portal or MTM mobile app to manage your rides. For the portal, please go to [email or web address].cy.  Call the [place] at [phone], at any time, 24 hours a day, 7 days a week. If you are in danger or need immediate medical attention call 911.  If you would like help to quit smoking, call 1-800-QUIT-NOW (1-857 236 6374) OR Espaol: 1-855-Djelo-Ya HD:1601594) o para ms informacin haga clic aqu or Text READY to 200-400 to register via text    Following is a copy of your plan of care:  Care Plan : General Social Work (Adult)  Updates made by [name], LCSW since 07/28/2022 12:00 AM     Problem: Coping Skills (General Plan of Care)      Long-Range Goal: Coping Skills Enhanced   Start Date: 11/07/2020  Expected End Date: 03/07/2022  Recent Progress: On track  Priority: High  Note:   Timeframe:  Long-Range Goal Priority:  High Start Date:       08/26/21                    Expected End Date:  ongoing   Current Barriers:  Financial constraints Mental Health Concerns  Social Isolation Limited education about mental  health support resources that are available to her within the local area* Cognitive Deficits Lacks knowledge of community resource   Clinical Social Work Clinical Goal(s):  Over the next 90 days, client will work with SW to address concerns related to experiencing ongoing symptoms of depression, sadness and grief since the passing of her mother Over the next 90 days, patient will work with LCSW to address needs related to implementing appropriate self-care and depression management.    Patient Self Care Activities:  Attends all scheduled provider appointments Calls provider office for new concerns or questions  Please see past updates related to this goal by clicking on the "Past Updates" button in the selected goal       24- Hour Availability:    [place]  [address] [place], [place] [facility] [phone]   Family [facility] of the [employer] [phone]  [place]  [phone]    [place]  [phone] (after hours)   Therapeutic Alternative/Mobile Crisis   [phone]   [place] National Suicide Hotline  641-881-7352 [place]) [place] 9[address]   Call 313-445-8173 for mental health emergencies   [facility]  [phone]);  [place] and [employer]  [phone]); [place], [place], [place], [place], [place], [place], [place] Urgent Care for [facility] Residents For 24/7 walk-up access to mental health services for [facility] children (4+),  adolescents and adults, please visit the [facility] located at [address] in [place], [place] also provides comprehensive outpatient behavioral health services in a variety of locations around the [place].  Connect With [place] [address], [place] [address] HelpLine: (430)697-8797 or 1-403-781-4442  Get Directions  Find Help 24/7 By  Phone Call our 24-hour HelpLine at (360)763-7454 or 913-474-4026 for immediate assistance for mental health and substance abuse issues.  [place]: [facility] (Ages 8 and Up) [place]: Emergency Dept., [place]: 1-800-SUICIDE The National Suicide Prevention Lifeline: 1-800-273-TALK     10 LITTLE Things To Do When You're Feeling Too Down To Do Anything  Take a shower. Even if you plan to stay in all day long and not see a soul, take a shower. It takes the most effort to hop in to the shower but once you do, you'll feel immediate results. It will wake you up and you'll be feeling much fresher (and cleaner too).  Brush and floss your teeth. Give your teeth a good brushing with a floss finish. It's a small task but it feels so good and you can check 'taking care of your health' off the list of things to do.  Do something small on your list. Most of [place] have some small thing we would like to get done (load of laundry, sew a button, email a friend). Doing one of these things will make you feel like you've accomplished something.  Drink water. Drinking water is easy right? It's also really beneficial for your health so keep a glass beside you all day and take sips often. It gives you energy and prevents you from boredom eating.  Do some floor exercises. The last thing you want to do is exercise but it might be just the thing you need the most. Keep it simple and do exercises that involve sitting or laying on the floor. Even the smallest of exercises release chemicals in the brain that make you feel good. Yoga stretches or core exercises are going to make you feel good with minimal effort.  Make your bed. Making your bed takes a few minutes but it's productive and you'll feel relieved when it's done. An unmade bed is a huge visual reminder that you're having an  unproductive day. Do it and consider it your housework for the day.  Put on some nice clothes. Take the sweatpants off even if you don't plan to go anywhere. Put on clothes that make you feel good. Take a look in the mirror so your brain recognizes the sweatpants have been replaced with clothes that make you look great. It's an instant confidence booster.  Wash the dishes. A pile of dirty dishes in the sink is a reflection of your mood. It's possible that if you wash up the dishes, your mood will follow suit. It's worth a try.  Cook a real meal. If you have the luxury to have a "do nothing" day, you have time to make a real meal for yourself. Make a meal that you love to eat. The process is good to get you out of the funk and the food will ensure you have more energy for tomorrow.  Write out your thoughts by hand. When you hand write, you stimulate your brain to focus on the moment that you're in so make yourself comfortable and write whatever comes into your mind.  Put those thoughts out on paper so they stop spinning around in your head. Those thoughts might be the very thing holding you down.

## 2022-08-03 ENCOUNTER — Ambulatory Visit (INDEPENDENT_AMBULATORY_CARE_PROVIDER_SITE_OTHER): Payer: Medicaid Other | Admitting: Nurse Practitioner

## 2022-08-03 ENCOUNTER — Encounter: Payer: Self-pay | Admitting: Nurse Practitioner

## 2022-08-03 VITALS — BP 150/51 | HR 68 | Temp 98.3°F

## 2022-08-03 DIAGNOSIS — F419 Anxiety disorder, unspecified: Secondary | ICD-10-CM | POA: Diagnosis not present

## 2022-08-03 DIAGNOSIS — E785 Hyperlipidemia, unspecified: Secondary | ICD-10-CM

## 2022-08-03 DIAGNOSIS — I10 Essential (primary) hypertension: Secondary | ICD-10-CM

## 2022-08-03 DIAGNOSIS — I872 Venous insufficiency (chronic) (peripheral): Secondary | ICD-10-CM

## 2022-08-03 DIAGNOSIS — J449 Chronic obstructive pulmonary disease, unspecified: Secondary | ICD-10-CM | POA: Diagnosis not present

## 2022-08-03 DIAGNOSIS — F331 Major depressive disorder, recurrent, moderate: Secondary | ICD-10-CM | POA: Diagnosis not present

## 2022-08-03 DIAGNOSIS — E1142 Type 2 diabetes mellitus with diabetic polyneuropathy: Secondary | ICD-10-CM | POA: Diagnosis not present

## 2022-08-03 DIAGNOSIS — E039 Hypothyroidism, unspecified: Secondary | ICD-10-CM | POA: Diagnosis not present

## 2022-08-03 DIAGNOSIS — E559 Vitamin D deficiency, unspecified: Secondary | ICD-10-CM | POA: Diagnosis not present

## 2022-08-03 NOTE — Assessment & Plan Note (Signed)
Chronic.  On 3L of O2 at home.  Working with Pulmonology.  Recommend getting portable oxygen to take with her when she goes places.  She is not able to take the large tank with her.

## 2022-08-03 NOTE — Assessment & Plan Note (Signed)
Chronic.  Controlled.  Continue with current medication regimen of celexa daily.  Labs ordered today.  Return to clinic in 3 months for reevaluation.  Call sooner if concerns arise.  

## 2022-08-03 NOTE — Assessment & Plan Note (Signed)
Labs ordered at visit today.  Will make recommendations based on lab results.   

## 2022-08-03 NOTE — Assessment & Plan Note (Signed)
Chronic.  Controlled.  Continue with current medication regimen.  Labs ordered today.  Return to clinic in 6 months for reevaluation.  Call sooner if concerns arise.  ? ?

## 2022-08-03 NOTE — Assessment & Plan Note (Signed)
Chronic.  Controlled.  Continue with current medication regimen of celexa daily.  Labs ordered today.  Return to clinic in 3 months for reevaluation.  Call sooner if concerns arise.

## 2022-08-03 NOTE — Assessment & Plan Note (Signed)
Recommended eating smaller high protein, low fat meals more frequently and exercising 30 mins a day 5 times a week with a goal of 10-15lb weight loss in the next 3 months.  

## 2022-08-03 NOTE — Assessment & Plan Note (Signed)
Chronic. Improved from prior visits.  Continue to follow up with specialist.

## 2022-08-03 NOTE — Progress Notes (Signed)
BP (!) 150/51   Pulse 68   Temp 98.3 F (36.8 C) (Oral)   LMP 06/09/2017 (Approximate)   SpO2 (!) 68%    Subjective:    Patient ID: Cheryl Chandler, female    DOB: Mar 31, 1966, 57 y.o.   MRN: [id number]  HPI: Cheryl Chandler is a 57 y.o. female  Chief Complaint  Patient presents with   Hypertension   HYPERTENSION / HYPERLIPIDEMIA Satisfied with current treatment? yes Duration of hypertension: years BP monitoring frequency: daily BP range: 185/85 BP medication side effects: no Past BP meds:  lasix and lisinopril, hydralazine, spirnloactone  Duration of hyperlipidemia: years Cholesterol medication side effects: no Cholesterol supplements: none Past cholesterol medications: simvastatin (zocor) Medication compliance: excellent compliance Aspirin: no Recent stressors: no Recurrent headaches: no Visual changes: no Palpitations: no Dyspnea: yes Chest pain: no Lower extremity edema: yes Dizzy/lightheaded: no Following with the lymphedema specialist.   COPD She is followed by Dr. Jayme Cloud with Pulmonology.  She is able to use it portable.  She feels like her anxiety is making her more short of breath at times.  She is still on the Trellegy. COPD status: Uncontrolled Satisfied with current treatment?: yes Oxygen use: yes- 3L  Dyspnea frequency: yes Cough frequency: daily Rescue inhaler frequency: has cut back from 4 times daily Limitation of activity: yes Productive cough:  Last Spirometry:  Pneumovax: Up to Date Influenza: Up to Date  HYPOTHYROIDISM Thyroid control status:controlled Satisfied with current treatment? yes Medication side effects: no Medication compliance: excellent compliance Etiology of hypothyroidism:  Recent dose adjustment:no Fatigue: yes Cold intolerance: no Heat intolerance: no Weight gain: no Weight loss: no Constipation: no Diarrhea/loose stools: no Palpitations: no Lower extremity edema: yes Anxiety/depressed mood:  no  DEPRESSION/ANXIETY Patient states she feels like her mood is doing okay.  She has been stressed lately about money.  States it has been difficult to make their bills.  She worries and feels like her stress just keeps building.    Flowsheet Row Office Visit from 08/03/2022 in [facility]  PHQ-9 Total Score 10         08/03/2022    1:45 PM 05/01/2022   10:30 AM 03/17/2022   10:42 AM 12/16/2021   11:39 AM  GAD 7 : Generalized Anxiety Score  Nervous, Anxious, on Edge 3 2 0 2  Control/stop worrying 3 3 3 3   Worry too much - different things 3 3 3 3   Trouble relaxing 3 2 3 2   Restless 0 2 0 0  Easily annoyed or irritable 0 2 3 0  Afraid - awful might happen 2 2 2  0  Total GAD 7 Score 14 16 14 10   Anxiety Difficulty Somewhat difficult Somewhat difficult Somewhat difficult Somewhat difficult   RASH Patient has an appointment with Dermatology in November.  She is taking benadryl which is helping.    DIABETES Has been out of her Trulicity for about 2 weeks.  She is having trouble paying for her medication.   Hypoglycemic episodes:no Polydipsia/polyuria: no Visual disturbance: no Chest pain: no Paresthesias: no Glucose Monitoring: no  Accucheck frequency: Not Checking  Fasting glucose:  Post prandial:  Evening:  Before meals: Taking Insulin?: no  Long acting insulin:  Short acting insulin: Blood Pressure Monitoring: daily Retinal Examination: Up to Date Foot Exam: Up to Date Diabetic Education: Not Completed Pneumovax: Up to Date Influenza: Up to Date Aspirin: no    Relevant past medical, surgical, family and social history reviewed  and updated as indicated. Interim medical history since our last visit reviewed. Allergies and medications reviewed and updated.  Review of Systems  Constitutional:  Negative for fever and unexpected weight change.  Eyes:  Negative for visual disturbance.  Respiratory:  Positive for shortness of breath. Negative for  cough and chest tightness.   Cardiovascular:  Negative for chest pain, palpitations and leg swelling.  Endocrine: Negative for cold intolerance.  Neurological:  Negative for dizziness and headaches.  Psychiatric/Behavioral:  Positive for dysphoric mood. Negative for suicidal ideas. The patient is nervous/anxious.     Per HPI unless specifically indicated above     Objective:    BP (!) 150/51   Pulse 68   Temp 98.3 F (36.8 C) (Oral)   LMP 06/09/2017 (Approximate)   SpO2 (!) 68%   Wt Readings from Last 3 Encounters:  05/12/22 280 lb (127 kg)  05/04/22 280 lb (127 kg)  02/02/22 (!) 320 lb (145.2 kg)    Physical Exam Vitals and nursing note reviewed.  Constitutional:      General: She is not in acute distress.    Appearance: Normal appearance. She is normal weight. She is not ill-appearing, toxic-appearing or diaphoretic.  HENT:     Head: Normocephalic.     Right Ear: External ear normal.     Left Ear: External ear normal.     Nose: Nose normal.     Mouth/Throat:     Mouth: Mucous membranes are moist.     Pharynx: Oropharynx is clear.  Eyes:     General:        Right eye: No discharge.        Left eye: No discharge.     Extraocular Movements: Extraocular movements intact.     Conjunctiva/sclera: Conjunctivae normal.     Pupils: Pupils are equal, round, and reactive to light.  Cardiovascular:     Rate and Rhythm: Normal rate and regular rhythm.     Heart sounds: No murmur heard. Pulmonary:     Effort: Pulmonary effort is normal. No respiratory distress.     Breath sounds: Decreased breath sounds present. No wheezing or rales.  Musculoskeletal:     Cervical back: Normal range of motion and neck supple.     Right lower leg: No edema.     Left lower leg: No edema.     Comments: Swelling has improved some from last visit.   Skin:    General: Skin is warm and dry.     Capillary Refill: Capillary refill takes less than 2 seconds.     Findings: Rash present. Rash is  papular.  Neurological:     General: No focal deficit present.     Mental Status: She is alert and oriented to person, place, and time. Mental status is at baseline.  Psychiatric:        Mood and Affect: Mood normal.        Behavior: Behavior normal.        Thought Content: Thought content normal.        Judgment: Judgment normal.     Results for orders placed or performed in visit on 07/03/22  ECHOCARDIOGRAM COMPLETE  Result Value Ref Range   S' Lateral 4.20 cm   Area-P 1/2 3.53 cm2   Est EF 55 - 60%       Assessment & Plan:   Problem List Items Addressed This Visit       Cardiovascular and Mediastinum   Essential hypertension -  Primary    Chronic. Not well controlled.   Continue with medications- hydralazine, spironolactone, lisinopril and Lasix.  Still not well controlled. Amlodipine d/c previously due to swelling in legs.  Referral placed for Advanced HTN clinic.  Labs ordered today.  Follow up in 3 months.  Continue to check blood pressures at home.       Relevant Orders   Comp Met (CMET)   Chronic venous insufficiency    Chronic. Improved from prior visits.  Continue to follow up with specialist.        Respiratory   COPD (chronic obstructive pulmonary disease)    Chronic.  On 3L of O2 at home.  Working with Pulmonology.  Recommend getting portable oxygen to take with her when she goes places.  She is not able to take the large tank with her.       Relevant Orders   AMB Referral to Pharmacy Medication Management     Endocrine   Adult hypothyroidism    Chronic.  Controlled.  Continue with current medication regimen.  Labs ordered today.  Return to clinic in 6 months for reevaluation.  Call sooner if concerns arise.       Relevant Orders   TSH   T4, free   Controlled type 2 diabetes mellitus with diabetic polyneuropathy, without long-term current use of insulin    Chronic.  Controlled.  Continue with current medication regimen.  She has been out of trulicity  for 2 weeks due to payment issues at the pharmacy.  New referral placed for pharmacy today to see if she can get help with medication costs.  Labs ordered today.  Return to clinic in 6 months for reevaluation.  Call sooner if concerns arise.        Relevant Orders   HgB A1c   Ambulatory referral to Advanced Hypertension Clinic - [facility]   AMB Referral to Pharmacy Medication Management     Other   Anxiety    Chronic.  Controlled.  Continue with current medication regimen of celexa daily.  Labs ordered today.  Return to clinic in 3 months for reevaluation.  Call sooner if concerns arise.       HLD (hyperlipidemia)    Chronic.  Controlled.  Continue with current medication regimen.  Labs ordered today.  Return to clinic in 6 months for reevaluation.  Call sooner if concerns arise.       Relevant Orders   Lipid Profile   AMB Referral to Pharmacy Medication Management   Morbid obesity    Recommended eating smaller high protein, low fat meals more frequently and exercising 30 mins a day 5 times a week with a goal of 10-15lb weight loss in the next 3 months.       Vitamin D deficiency    Labs ordered at visit today.  Will make recommendations based on lab results.        Relevant Orders   Vitamin D (25 hydroxy)   Moderate episode of recurrent major depressive disorder    Chronic.  Controlled.  Continue with current medication regimen of celexa daily.  Labs ordered today.  Return to clinic in 3 months for reevaluation.  Call sooner if concerns arise.          Follow up plan: Return in about 3 months (around 11/02/2022) for HTN, HLD, DM2 FU.

## 2022-08-03 NOTE — Assessment & Plan Note (Addendum)
Chronic. Not well controlled.   Continue with medications- hydralazine, spironolactone, lisinopril and Lasix.  Still not well controlled. Amlodipine d/c previously due to swelling in legs.  Referral placed for Advanced HTN clinic.  Labs ordered today.  Follow up in 3 months.  Continue to check blood pressures at home.

## 2022-08-03 NOTE — Assessment & Plan Note (Signed)
Chronic.  Controlled.  Continue with current medication regimen.  She has been out of trulicity for 2 weeks due to payment issues at the pharmacy.  New referral placed for pharmacy today to see if she can get help with medication costs.  Labs ordered today.  Return to clinic in 6 months for reevaluation.  Call sooner if concerns arise.

## 2022-08-04 LAB — VITAMIN D 25 HYDROXY (VIT D DEFICIENCY, FRACTURES): Vit D, 25-Hydroxy: 30.6 ng/mL (ref 30.0–100.0)

## 2022-08-04 LAB — COMPREHENSIVE METABOLIC PANEL
ALT: 19 IU/L (ref 0–32)
AST: 14 IU/L (ref 0–40)
Albumin/Globulin Ratio: 1.2 (ref 1.2–2.2)
Albumin: 3.7 g/dL — ABNORMAL LOW (ref 3.8–4.9)
Alkaline Phosphatase: 106 IU/L (ref 44–121)
BUN/Creatinine Ratio: 26 — ABNORMAL HIGH (ref 9–23)
BUN: 18 mg/dL (ref 6–24)
Bilirubin Total: 0.4 mg/dL (ref 0.0–1.2)
CO2: 27 mmol/L (ref 20–29)
Calcium: 8.7 mg/dL (ref 8.7–10.2)
Chloride: 102 mmol/L (ref 96–106)
Creatinine, Ser: 0.7 mg/dL (ref 0.57–1.00)
Globulin, Total: 3 g/dL (ref 1.5–4.5)
Glucose: 91 mg/dL (ref 70–99)
Potassium: 4.4 mmol/L (ref 3.5–5.2)
Sodium: 140 mmol/L (ref 134–144)
Total Protein: 6.7 g/dL (ref 6.0–8.5)
eGFR: 102 mL/min/{1.73_m2} (ref >59)

## 2022-08-04 LAB — LIPID PANEL
Chol/HDL Ratio: 2.8 ratio (ref 0.0–4.4)
Cholesterol, Total: 110 mg/dL (ref 100–199)
HDL: 39 mg/dL — ABNORMAL LOW (ref >39)
LDL Chol Calc (NIH): 55 mg/dL (ref 0–99)
Triglycerides: 77 mg/dL (ref 0–149)
VLDL Cholesterol Cal: 16 mg/dL (ref 5–40)

## 2022-08-04 LAB — T4, FREE: Free T4: 1.14 ng/dL (ref 0.82–1.77)

## 2022-08-04 LAB — HEMOGLOBIN A1C
Est. average glucose Bld gHb Est-mCnc: 126 mg/dL
Hgb A1c MFr Bld: 6 % — ABNORMAL HIGH (ref 4.8–5.6)

## 2022-08-04 LAB — TSH: TSH: 2.76 u[IU]/mL (ref 0.450–4.500)

## 2022-08-04 NOTE — Progress Notes (Signed)
Please let patient know that her lab work looks good.  Her A1c is stable at 6.0.  Continue with current medication regimen.  Follow up as discussed.

## 2022-08-06 ENCOUNTER — Telehealth: Payer: Self-pay

## 2022-08-06 NOTE — Progress Notes (Signed)
   Care Guide Note  08/06/2022 Name: Cheryl Chandler MRN: [id number] DOB: 12-Mar-1966  Referred by: [name], NP Reason for referral : Care Coordination (Outreach to schedule referral )   Cheryl Chandler is a 57 y.o. year old female who is a primary care patient of [name], NP. Cheryl Chandler was referred to the pharmacist for assistance related to DM.    Successful contact was made with the patient to discuss pharmacy services including being ready for the pharmacist to call at least 5 minutes before the scheduled appointment time, to have medication bottles and any blood sugar or blood pressure readings ready for review. The patient agreed to meet with the pharmacist via with the pharmacist via telephone visit on (date/time).  08/07/2022  Penne Lash, RMA Care Guide [facility]  [place], [place] [address] Direct Dial: [phone] [email or web address]

## 2022-08-07 ENCOUNTER — Other Ambulatory Visit: Payer: Medicaid Other

## 2022-08-07 ENCOUNTER — Telehealth: Payer: Self-pay

## 2022-08-07 ENCOUNTER — Other Ambulatory Visit: Payer: Self-pay | Admitting: Nurse Practitioner

## 2022-08-07 DIAGNOSIS — Z993 Dependence on wheelchair: Secondary | ICD-10-CM | POA: Diagnosis not present

## 2022-08-07 DIAGNOSIS — R3981 Functional urinary incontinence: Secondary | ICD-10-CM | POA: Diagnosis not present

## 2022-08-07 DIAGNOSIS — E039 Hypothyroidism, unspecified: Secondary | ICD-10-CM

## 2022-08-07 MED ORDER — NITROGLYCERIN 0.4 MG SL SUBL: SUBLINGUAL_TABLET | SUBLINGUAL | 1 refills | Status: DC

## 2022-08-07 MED ORDER — HYDROXYZINE PAMOATE 25 MG PO CAPS: 25.0000 mg | ORAL_CAPSULE | Freq: Three times a day (TID) | ORAL | 1 refills | Status: DC | PRN

## 2022-08-07 NOTE — Progress Notes (Unsigned)
   08/07/2022  Patient ID: Cheryl Chandler, female   DOB: April 29, 1965, 57 y.o.   MRN: [id number]  Patient stating she was able to get her Trulicity from [facility] today; but she was not able to get a refill on Epipen, and hydralazine was refilled for 25mg  dose.  Contacted pharmacy, and their records show the Epipen prescription was inactivated; but per Epic, it was reordered.  Pending new prescription for PCP to sign since patient does not have Epipen at home.  Pharmacy had both 25mg  and 50mg  strengths of hydralazine on file with active refills.  I asked that they deactivate the 25mg  prescription, and I educated Ms. Mcilwain to take 2 of the 25mg  hydralazine tablets (to equal 50mg ) TID.  Lenna Gilford, PharmD, DPLA

## 2022-08-07 NOTE — Progress Notes (Signed)
08/07/2022 Name: Cheryl Chandler MRN: [id number] DOB: 1966/02/22  Chief Complaint  Patient presents with   Medication Management   Cheryl Chandler is a 57 y.o. year old female who presented for a telephone visit.   They were referred to the pharmacist by their PCP for assistance in managing medication access.   Patient is participating in a Managed Medicaid Plan:  Yes  Subjective: Telephone visit with patient after referral was received in regard to medication management and access, specific to Trulicity prescription. Care Team: Primary Care Provider: [name], NP ; Next Scheduled Visit: 11/02/22 Pulmonology 08/10/22  Medication Access/Adherence Current Pharmacy:  [facility] [place], [place] - [address] [place] [address] Phone: [phone] Fax: [phone]  Patient reports affordability concerns with their medications: Yes  Patient reports access/transportation concerns to their pharmacy: No  Patient reports adherence concerns with their medications:  Yes  Has been without Trulicity 2+ weeks  Diabetes: Current medications: Trulicity 0.75mg  weekly -Patient has not been able to pick up Trulicity in the past two weeks.  Using [facility], where they have allowed patient a charge account but are now requesting payment upfront.  Medication is covered on Managed Medicaid plan; but due to recent increases in rent and utilities, patient has not been able to afford. -Current fasting glucose readings: 100-135 -Using Accu-Check Guide meter; testing 3 times daily -Patient reports hypoglycemic s/sx including dizziness, shakiness, sweating.  Infrequent, but states BG was 60 when this occurred, and was resolved by a few small pieces of candy. -Patient denies hyperglycemic symptoms including polyuria, polydipsia, polyphagia, nocturia, neuropathy, blurred vision. -Endorses watching what she eats and incorporating more salads and vegetables into meals.   Only drinking water and will sometimes add zero calorie flavoring pack. -Current physical activity: limited by wheelchair, but she does daily leg lifts  Hypertension: Current medications: amlodipine 5mg  daily, furosemide 20mg  daily, hydralazine 50mg  TID, lisinopril 40mg  daily, spironoloactone 12.5mg  daily -Patient is taking furosemide every day due to consistent swelling in legs -Amlodipine dose has not been increased due to swelling in patient's legs -Patient has still been taking 25mg  dose of hydralazine TID versus newer dose of 50mg  TID -Endorses taking meloxicam 15mg  routinely every day versus PRN -Patient has a validated, automated, upper arm home BP cuff -Current blood pressure readings readings: 175-185/75-85  Hyperlipidemia/ASCVD Risk Reduction Current lipid lowering medications: atorvastatin 80mg  daily   Medication Management: Current adherence strategy: uses written schedule to assist with remembering doses for herself and her husband -Patient reports Good adherence to medications -Patient reports the following barriers to adherence: affordability- all medications are covered on Managed Medicaid, but with several prescriptions, the $4 copays are not always affordable -States that she will need a refill on Nitroglycerin tablets, which will need to be renewed by PCP based on last prescription and times filled since -Also states she needs a refill on Epipen, which should still have refills at [facility] -Endorses continued allergic reaction causing skin irritation, and cannot see dermatology until November.  Requesting a refill on hydroxyzine.   Objective: Lab Results  Component Value Date   HGBA1C 6.0 (H) 08/03/2022   Lab Results  Component Value Date   CREATININE 0.70 08/03/2022   BUN 18 08/03/2022   NA 140 08/03/2022   K 4.4 08/03/2022   CL 102 08/03/2022   CO2 27 08/03/2022   Lab Results  Component Value Date   CHOL 110 08/03/2022   HDL 39 (L) 08/03/2022  LDLCALC 55 08/03/2022   TRIG 77 08/03/2022   CHOLHDL 2.8 08/03/2022   Medications Reviewed Today     Reviewed by Lenna Gilford, [facility] (Pharmacist) on 08/07/22 at 0927  Med List Status: <None>   Medication Order Taking? Sig Documenting Provider Last Dose Status Informant  ACCU-CHEK GUIDE test strip [id number] Yes USE TO CHECK BLOOD SUGAR 3 TIMES DAILY AS DIRECTED [name], NP Taking Active   Accu-Chek Softclix Lancets lancets [id number] Yes CHECK BLOOD SUGAR TWICE DAILY [name], NP Taking Active   albuterol (PROVENTIL) (2.5 MG/3ML) 0.083% nebulizer solution [id number] Yes USE 1 VIAL VIA NEBULIZER EVERY 4 HOURS AS NEEDED [name], NP Taking Active   amLODipine (NORVASC) 5 MG tablet [id number] Yes TAKE 1 TABLET BY MOUTH ONCE DAILY [name], NP Taking Active   atorvastatin (LIPITOR) 80 MG tablet [id number] Yes TAKE 1 TABLET BY MOUTH ONCE EVERY Charolotte Eke, NP Taking Active   Blood Glucose Monitoring Suppl (ACCU-CHEK GUIDE) w/Device KIT [id number] Yes Use to check blood sugar 3 times a day. Aura Dials T, NP Taking Active   Blood Pressure Monitoring (OMRON 3 SERIES BP MONITOR) DEVI [id number] Yes Use to check blood pressure as directed [name], NP Taking Active   citalopram (CELEXA) 40 MG tablet [id number] Yes TAKE 1 TABLET BY MOUTH ONCE DAILY [name], NP Taking Active   diphenhydrAMINE (BENADRYL) 25 mg capsule [id number] Yes Take 25 mg by mouth every 6 (six) hours as needed for itching. Taking once a day [provider] Taking Active Self  EPINEPHrine 0.3 mg/0.3 mL IJ SOAJ injection [id number] Yes INJECT 1 SYRINGE INTO OUTER THIGH ONCE AS NEEDED FOR SEVERE ALLERGIC REACTION. [name], NP Taking Active   Fluticasone-Umeclidin-Vilant (TRELEGY ELLIPTA) 100-62.5-25 MCG/ACT AEPB [id number] Yes Inhale 1 puff into the lungs daily. Salena Saner, MD Taking Active   furosemide (LASIX) 20 MG tablet [id number] Yes TAKE 1  TABLET BY MOUTH ONCE DAILY IN AFTERNOON AS NEEDED LEG EDEMA [name], NP Taking Active            Med Note Littie Deeds, Franchesca Veneziano A   Fri Aug 07, 2022  9:18 AM) Taking every day  gabapentin (NEURONTIN) 300 MG capsule [id number] Yes TAKE 1 CAPSULE BY MOUTH 3 TIMES DAILY ASNEEDED (NEED OFFICE VISIT FOR FURTHER REFILLS) [name], NP Taking Active   hydrALAZINE (APRESOLINE) 50 MG tablet [id number] No Take 1 tablet (50 mg total) by mouth 3 (three) times daily.  Patient not taking: Reported on 08/07/2022   [name], NP Not Taking Active            Med Note Littie Deeds, Farren Landa A   Fri Aug 07, 2022  9:21 AM) Has been taking  TID  levothyroxine (SYNTHROID) 50 MCG tablet [id number] Yes TAKE 1 TABLET BY MOUTH ONCE DAILY ON AN EMPTY STOMACH. WAIT 30 MINUTES BEFORE TAKING OTHER MEDS. [name], NP Taking Active   lisinopril (ZESTRIL) 40 MG tablet [id number] Yes TAKE 1 TABLET BY MOUTH ONCE DAILY [name], NP Taking Active   meloxicam (MOBIC) 15 MG tablet [id number] Yes TAKE 1 TABLET BY MOUTH ONCE DAILY AS NEEDED WITH FOOD OR MILK [name], NP Taking Active            Med Note Littie Deeds, Leighanna Kirn A   Fri Aug 07, 2022  9:13 AM) Taking daily  montelukast (SINGULAIR) 10 MG tablet [id number] Yes TAKE 1 TABLET BY MOUTH AT BEDTIME [name], NP Taking Active   nitroGLYCERIN (NITROSTAT) 0.4 MG SL tablet [id number]  Yes DISSOLVE 1 TABLET UNDER TONGUE AT ONSET OF CHEST PAIN. REPEAT IN 5 MIN IF NOT RESOLVED, MAX 3 DOSES. 911 IF NEEDED. [name], NP Taking Active   omeprazole (PRILOSEC) 20 MG capsule [id number] Yes TAKE 1 CAPSULE BY MOUTH ONCE DAILY [name], NP Taking Active   OXYGEN [id number] Yes Inhale 3 L into the lungs daily. [provider] Taking Active   spironolactone (ALDACTONE) 25 MG tablet [id number] Yes Take 0.5 tablets (12.5 mg total) by mouth daily. [name], NP Taking Active   TRULICITY 0.75 MG/0.5ML Namon Cirri [id number] Yes INJECT 0.75MG   SUBQ ONCE A Saintclair Halsted, NP Taking Active   Vitamin D, Ergocalciferol, (DRISDOL) 1.25 MG (50000 UNIT) CAPS capsule [id number] Yes TAKE 1 CAPSULE BY MOUTH EVERY 7 DAYS [name], NP Taking Active            Assessment/Plan:   Diabetes: - Currently controlled - Recommend to continue current regimen - Patient's father-in-law paid balance at [facility] yesterday, and patient should receive medications today.  Provided her with my direct number to notify me if she is unable to get Trulicity or other medications today or in the future.  [facility] may be an option for filling, as they can occasionally waive Medicaid copays and/or allow an account option if patients cannot afford their medications. - Recommend to continue to check glucose TID and record  Hypertension: - Currently uncontrolled - Instructed patient to sop hydralazine  TID and start taking hydralazine  TID; she states she has this at home already - Educated that meloxicam can increase BP, so only use this if needed instead of every day - Recommend to continue taking BP daily and recording - Referral in for HTN Clinic and sees Cardiology in June   Hyperlipidemia/ASCVD Risk Reduction: - Currently controlled.  - Recommend to continue current regimen   Medication Management: - Currently strategy sufficient to maintain appropriate adherence to prescribed medication regimen - Pending refills for nitroglycerin and hydroxyzine for [name] to sign if in agreement  Follow Up Plan: None scheduled at this time but will follow-up as needed per PCP  Lenna Gilford, PharmD, DPLA

## 2022-08-07 NOTE — Telephone Encounter (Signed)
Requested Prescriptions  Pending Prescriptions Disp Refills   citalopram (CELEXA) 40 MG tablet [Pharmacy Med Name: CITALOPRAM HYDROBROMIDE 40 MG TAB] 90 tablet 0    Sig: TAKE 1 TABLET BY MOUTH ONCE DAILY     Psychiatry:  Antidepressants - SSRI Passed - 08/07/2022  8:24 AM      Passed - Completed PHQ-2 or PHQ-9 in the last 360 days      Passed - Valid encounter within last 6 months    Recent Outpatient Visits           4 days ago Essential hypertension   [place] [facility] [name], NP   3 months ago Morbid obesity [facility])   [place] [facility] [name], NP   4 months ago Rash and nonspecific skin eruption   [place] [facility] Mecum, Oswaldo Conroy, PA-C   7 months ago Appointment canceled by hospital   [place] [facility] [name], NP   7 months ago Essential hypertension   [place] [facility] [name], NP       Future Appointments             In 1 month Gollan, Tollie Pizza, MD [facility] [place]   In 2 months Mecum, Oswaldo Conroy, PA-C [place] [facility]   In 7 months Willeen Niece, MD [place] [facility]             levothyroxine (SYNTHROID) 50 MCG tablet [Pharmacy Med Name: LEVOTHYROXINE SODIUM 50 MCG TAB] 90 tablet 0    Sig: TAKE 1 TABLET BY MOUTH ONCE DAILY ON AN EMPTY STOMACH. WAIT 30 MINUTES BEFORE TAKING OTHER MEDS.     Endocrinology:  Hypothyroid Agents Passed - 08/07/2022  8:24 AM      Passed - TSH in normal range and within 360 days    TSH  Date Value Ref Range Status  08/03/2022 2.760 0.450 - 4.500 uIU/mL Final         Passed - Valid encounter within last 12 months    Recent Outpatient Visits           4 days ago Essential hypertension   [place] [facility] [name], NP   3 months ago Morbid obesity [facility])   [place] [facility] [name], NP   4  months ago Rash and nonspecific skin eruption   [place] [facility] Mecum, Oswaldo Conroy, PA-C   7 months ago Appointment canceled by hospital   [place] [facility] [name], NP   7 months ago Essential hypertension   [place] [facility] [name], NP       Future Appointments             In 1 month Gollan, Tollie Pizza, MD [name] [place]   In 2 months Mecum, Oswaldo Conroy, PA-C [place] [facility]   In 7 months Willeen Niece, MD [facility]             hydrOXYzine (VISTARIL) 25 MG capsule [Pharmacy Med Name: HYDROXYZINE PAMOATE 25 MG CAP] 30 capsule 0    Sig: TAKE 1 CAPSULE BY MOUTH EVERY 8 HOURS ASNEEDED     Ear, Nose, and Throat:  Antihistamines 2 Passed - 08/07/2022  8:24 AM      Passed - Cr in normal range and within 360 days  Creatinine  Date Value Ref Range Status  08/05/2014 0.86 mg/dL Final    Comment:    1.61-0.96 NOTE: New Reference Range  07/03/14    Creatinine, Ser  Date Value Ref Range Status  08/03/2022 0.70 0.57 - 1.00 mg/dL Final         Passed - Valid encounter within last 12 months    Recent Outpatient Visits           4 days ago Essential hypertension   [place] [facility] [name], NP   3 months ago Morbid obesity [facility])   [place] [facility] [name], NP   4 months ago Rash and nonspecific skin eruption   [place] [facility] Mecum, Oswaldo Conroy, PA-C   7 months ago Appointment canceled by hospital   [facility] [name], NP   7 months ago Essential hypertension   [place] [facility] [name], NP       Future Appointments             In 1 month Gollan, Tollie Pizza, MD [facility] [place]   In 2 months Mecum, Oswaldo Conroy, PA-C [facility]   In 7 months Willeen Niece, MD [facility]

## 2022-08-10 ENCOUNTER — Encounter: Payer: Self-pay | Admitting: Pulmonary Disease

## 2022-08-10 ENCOUNTER — Ambulatory Visit (INDEPENDENT_AMBULATORY_CARE_PROVIDER_SITE_OTHER): Payer: Medicaid Other | Admitting: Pulmonary Disease

## 2022-08-10 VITALS — BP 124/70 | HR 72 | Temp 97.1°F | Ht 62.5 in

## 2022-08-10 DIAGNOSIS — J449 Chronic obstructive pulmonary disease, unspecified: Secondary | ICD-10-CM | POA: Diagnosis not present

## 2022-08-10 DIAGNOSIS — E668 Other obesity: Secondary | ICD-10-CM | POA: Diagnosis not present

## 2022-08-10 DIAGNOSIS — R0602 Shortness of breath: Secondary | ICD-10-CM

## 2022-08-10 DIAGNOSIS — Z9189 Other specified personal risk factors, not elsewhere classified: Secondary | ICD-10-CM

## 2022-08-10 LAB — NITRIC OXIDE: Nitric Oxide: 11

## 2022-08-10 MED ORDER — EPINEPHRINE 0.3 MG/0.3ML IJ SOAJ: INTRAMUSCULAR | 3 refills | Status: DC

## 2022-08-10 NOTE — Patient Instructions (Addendum)
We are going to get an arterial blood gas, this is a blood test that will tell me about your carbon dioxide in the blood.  We are going to see what would be the feasibility of getting a sleep study at home.  Because of your current weight issues this may not be feasible.  We will see you in follow-up in 3 to 4 months time call sooner should any problems arise.  We will reach out to [employer] to see if they can get to a carrier for your tanks.

## 2022-08-10 NOTE — Progress Notes (Unsigned)
Subjective:    Patient ID: Cheryl Chandler, female    DOB: 1965/10/07, 57 y.o.   MRN: [id number] Patient Care Team: [name], NP as PCP - General Craft, Calvert Cantor, RN as Case Manager [name] as Social Worker Clearance Coots, Janace Litten, RPH-CPP (Pharmacist) Irving Burton as [employer] Care Management (Licensed Clinical Social Worker)  Chief Complaint  Patient presents with   Follow-up    SOB constant. Occasional wheezing. Cough with clear or yellow sputum.   HPI Cheryl Chandler is a 57 year old former smoker with a 90-pack-year history of smoking and a history as noted below who presents for follow-up on the issue of dyspnea and cough.  The patient was initially seen here on 02 February 2022.  She states that her dyspnea is at baseline though she feels that Trelegy Ellipta does help her with this.  She notes occasional wheezing.  Cough is productive of clear sputum.  At her prior visit we obtain overnight oximetry she qualified for nocturnal oxygen.  She is currently on 3 L/min and states that this helps her.  She would benefit from a sleep study however, she states that she would never be able to make it into the sleep lab.  She is not a candidate for home sleep study due to morbid obesity.  The patient also states that she sleeps sitting up.  She is currently not enthusiastic about obtaining a sleep study.  Patient also had echocardiogram performed on 03 July 2022 due to a concern for cardiomegaly on chest x-ray.  This shows that she has preserved LVEF, grade 1 diastolic dysfunction and normal pulmonary artery systolic pressure.  She has not had any fevers, chills or sweats.  No hemoptysis.  Chronic issues with chest discomfort.  Recurrent lower extremity edema, no calf tenderness.  Remains very sedentary.   DATA 02/07/2022 overnight oximetry: Significant oxygen desaturations to 84% with cyclical variation.  Patient would benefit from split-night sleep study  however patient states unable to do.  Supplemental oxygen initiated. 03/03/2022 overnight oximetry on O2: Patient with persistent desaturations at 2 L/min.  Saturations as low as 81%.  Titrated to 3 L/min. 06/23/2022 PFTs: Patient was not able to produce maneuvers well.  FEV1 0.91 L or 36% predicted, FVC 1.10 L or 34% predicted, FEV1/FVC 83%.  Lung volumes reduced ERV severely reduced at 13% consistent with obesity. Diffusion capacity normal.  There is reversibility of 23% in FEV1 postbronchodilator. 07/03/2022 echocardiogram: LVEF 55 to 60%, grade 1 diastolic dysfunction, normal pulmonary artery systolic pressure.  Mild mitral regurgitation.  No aortic valve abnormalities.  Review of Systems A 10 point review of systems was performed and it is as noted above otherwise negative.  Patient Active Problem List   Diagnosis Date Noted   Moderate episode of recurrent major depressive disorder 08/03/2022   Controlled type 2 diabetes mellitus with diabetic polyneuropathy, without long-term current use of insulin 08/03/2022   Venous ulcer 05/03/2022   Foot pain, right 09/01/2021   Fibrocystic disease of both breasts 05/07/2019   IFG (impaired fasting glucose) 05/03/2019   Chronic venous insufficiency 11/09/2018   Overactive bladder 09/30/2018   Peripheral neuropathy 09/30/2018   Apnea 01/02/2016   Lymphedema 11/30/2014   Dyspnea on exertion 11/30/2014   Anxiety 11/29/2014   Arthritis 11/29/2014   COPD (chronic obstructive pulmonary disease) 11/29/2014   Clinical depression 11/29/2014   H/O eating disorder 11/29/2014   Esophageal reflux 11/29/2014   Acne inversa 11/29/2014   Essential hypertension 11/29/2014  HLD (hyperlipidemia) 11/29/2014   Adult hypothyroidism 11/29/2014   Insomnia 11/29/2014   Low back pain 11/29/2014   Morbid obesity 11/29/2014   Posttraumatic stress disorder 11/29/2014   Vitamin D deficiency 11/29/2014   Nicotine dependence, cigarettes, uncomplicated 11/29/2014    Social History   Tobacco Use   Smoking status: Former    Packs/day: 3.00    Years: 30.00    Additional pack years: 0.00    Total pack years: 90.00    Types: Cigarettes    Quit date: 01/2022    Years since quitting: 0.5   Smokeless tobacco: Never  Substance Use Topics   Alcohol use: No    Alcohol/week: 0.0 standard drinks of alcohol   Allergies  Allergen Reactions   Codeine Shortness Of Breath and Rash   Percocet [Oxycodone-Acetaminophen] Shortness Of Breath   Sulfa Antibiotics Shortness Of Breath and Rash   Aspirin Hives   Bee Venom     Bee stings   Darvon [Propoxyphene]     Darvocet-N 100   Latex    Oxycodone Nausea And Vomiting   Penicillins     Has patient had a PCN reaction causing immediate rash, facial/tongue/throat swelling, SOB or lightheadedness with hypotension: Unknown Has patient had a PCN reaction causing severe rash involving mucus membranes or skin necrosis: Unknown Has patient had a PCN reaction that required hospitalization: Unknown Has patient had a PCN reaction occurring within the last 10 years: Unknown If all of the above answers are "NO", then may proceed with Cephalosporin use.   Current Meds  Medication Sig   ACCU-CHEK GUIDE test strip USE TO CHECK BLOOD SUGAR 3 TIMES DAILY AS DIRECTED   Accu-Chek Softclix Lancets lancets CHECK BLOOD SUGAR TWICE DAILY   albuterol (PROVENTIL) (2.5 MG/3ML) 0.083% nebulizer solution USE 1 VIAL VIA NEBULIZER EVERY 4 HOURS AS NEEDED   amLODipine (NORVASC) 5 MG tablet TAKE 1 TABLET BY MOUTH ONCE DAILY   atorvastatin (LIPITOR) 80 MG tablet TAKE 1 TABLET BY MOUTH ONCE EVERY EVENING   Blood Glucose Monitoring Suppl (ACCU-CHEK GUIDE) w/Device KIT Use to check blood sugar 3 times a day.   Blood Pressure Monitoring (OMRON 3 SERIES BP MONITOR) DEVI Use to check blood pressure as directed   citalopram (CELEXA) 40 MG tablet TAKE 1 TABLET BY MOUTH ONCE DAILY   diphenhydrAMINE (BENADRYL) 25 mg capsule Take 25 mg by mouth every  6 (six) hours as needed for itching. Taking once a day   EPINEPHrine 0.3 mg/0.3 mL IJ SOAJ injection INJECT 1 SYRINGE INTO OUTER THIGH ONCE AS NEEDED FOR SEVERE ALLERGIC REACTION.   Fluticasone-Umeclidin-Vilant (TRELEGY ELLIPTA) 100-62.5-25 MCG/ACT AEPB Inhale 1 puff into the lungs daily.   furosemide (LASIX) 20 MG tablet TAKE 1 TABLET BY MOUTH ONCE DAILY IN AFTERNOON AS NEEDED LEG EDEMA   gabapentin (NEURONTIN) 300 MG capsule TAKE 1 CAPSULE BY MOUTH 3 TIMES DAILY ASNEEDED (NEED OFFICE VISIT FOR FURTHER REFILLS)   hydrALAZINE (APRESOLINE) 50 MG tablet Take 1 tablet (50 mg total) by mouth 3 (three) times daily.   hydrOXYzine (VISTARIL) 25 MG capsule Take 1 capsule (25 mg total) by mouth every 8 (eight) hours as needed.   levothyroxine (SYNTHROID) 50 MCG tablet TAKE 1 TABLET BY MOUTH ONCE DAILY ON AN EMPTY STOMACH. WAIT 30 MINUTES BEFORE TAKING OTHER MEDS.   lisinopril (ZESTRIL) 40 MG tablet TAKE 1 TABLET BY MOUTH ONCE DAILY   meloxicam (MOBIC) 15 MG tablet TAKE 1 TABLET BY MOUTH ONCE DAILY AS NEEDED WITH FOOD OR MILK  montelukast (SINGULAIR) 10 MG tablet TAKE 1 TABLET BY MOUTH AT BEDTIME   nitroGLYCERIN (NITROSTAT) 0.4 MG SL tablet DISSOLVE 1 TABLET UNDER TONGUE AT ONSET OF CHEST PAIN. REPEAT IN 5 MIN IF NOT RESOLVED, MAX 3 DOSES. 911 IF NEEDED.   omeprazole (PRILOSEC) 20 MG capsule TAKE 1 CAPSULE BY MOUTH ONCE DAILY   OXYGEN Inhale 3 L into the lungs daily.   spironolactone (ALDACTONE) 25 MG tablet Take 0.5 tablets (12.5 mg total) by mouth daily.   TRULICITY 0.75 MG/0.5ML SOPN INJECT 0.75MG  SUBQ ONCE A WEEK   Vitamin D, Ergocalciferol, (DRISDOL) 1.25 MG (50000 UNIT) CAPS capsule TAKE 1 CAPSULE BY MOUTH EVERY 7 DAYS   Immunization History  Administered Date(s) Administered   Influenza,inj,Quad PF,6+ Mos 01/30/2013, 01/29/2014, 01/13/2019   Moderna SARS-COV2 Booster Vaccination 11/05/2020   Moderna Sars-Covid-2 Vaccination 12/25/2019, 01/22/2020, 01/22/2020   Pneumococcal Polysaccharide-23  01/30/2013   Tdap 09/18/2021   Zoster Recombinat (Shingrix) 09/18/2021, 12/16/2021        Objective:   Physical Exam BP 124/70 (BP Location: Left Wrist, Cuff Size: Normal)   Pulse 72   Temp (!) 97.1 F (36.2 C)   Ht 5' 2.5" (1.588 m)   LMP 06/09/2017 (Approximate)   SpO2 91%   BMI 50.40 kg/m   SpO2: 91 % O2 Device: None (Room air)  GENERAL: Morbidly obese woman, presents in wheelchair. No conversational dyspnea.  No distress. HEAD: Normocephalic, atraumatic.  EYES: Pupils equal, round, reactive to light.  No scleral icterus.  MOUTH: Nose/mouth/throat not examined due to masking requirements for COVID 19.  Patient wearing mask today. NECK: Supple. No thyromegaly. Trachea midline. No JVD.  No adenopathy. PULMONARY: Good air entry bilaterally.  No adventitious sounds. CARDIOVASCULAR: S1 and S2. Regular rate and rhythm.  Distant heart tones.  No rubs murmurs or gallops noted. ABDOMEN: Protuberant/obese, otherwise benign. MUSCULOSKELETAL: No joint deformity, no clubbing.  She has significant lymphedema of the lower extremities, difficult to assess due to nonpitting edema. NEUROLOGIC: Grossly nonfocal.  Gait not tested. SKIN: Intact,warm,dry.  Multiple excoriations in the upper extremities.  Significant chronic stasis dermatitis changes bilateral extremities.  Skin is lichenified in the lower extremities. PSYCH: Flat affect.  Passive behavior.    Lab Results  Component Value Date   NITRICOXIDE 11 08/10/2022  *No evidence of type II inflammation    Assessment & Plan:     ICD-10-CM   1. COPD, moderate  J44.9    At least moderate COPD Hard to assess severity due to concomitant restrictive component Continue Trelegy Ellipta Continue as needed albuterol    2. SOB (shortness of breath)  R06.02 Nitric oxide    Pulmonary Function Test [facility] Only   Multifactorial COPD, deconditioning, obesity with obesity hypoventilation likely    3. At risk for sleep apnea  Z91.89 Home sleep  test   Will attempt home sleep study Patient states cannot come to sleep lab    4. Extreme obesity  E66.8    Obesity with alveolar hypoventilation likely Will obtain ABG     Orders Placed This Encounter  Procedures   Nitric oxide   Pulmonary Function Test [facility] Only    Standing Status:   Future    Standing Expiration Date:   08/10/2023    Order Specific Question:   ABG    Answer:   Yes    Order Specific Question:   This test can only be performed at    Answer:   [facility] sleep test  Standing Status:   Future    Standing Expiration Date:   08/10/2023    Order Specific Question:   Where should this test be performed:    Answer:   [facility] - Pulmonary    Follow-up in 3 to 4 months time she is to call sooner should any new difficulties arise.  Gailen Shelter, MD Advanced Bronchoscopy PCC[facility] [place] [facility]    *This note was dictated using voice recognition software/Dragon.  Despite best efforts to proofread, errors can occur which can change the meaning. Any transcriptional errors that result from this process are unintentional and may not be fully corrected at the time of dictation.

## 2022-08-11 ENCOUNTER — Encounter: Payer: Self-pay | Admitting: Pulmonary Disease

## 2022-08-12 ENCOUNTER — Telehealth: Payer: Self-pay

## 2022-08-12 NOTE — Progress Notes (Unsigned)
   08/12/2022  Patient ID: Cheryl Chandler, female   DOB: 12-18-1965, 57 y.o.   MRN: [id number]  Subjective/Objective: Telephone visit to check in with patient to ensure she received medications from [facility] and has increased dose of BP medication  Medication Management -Patient was able to pick up Trulicity from [facility] at the end of last week -Epipen refill sent into [facility] and patient is aware  HTN -Just started taking 2 tablets of the hydralazine  TID at the end of last week -Endorses home BP 145/50  Assessment/Plan:  Medication Management -Plans to pick up Epipen ASAp -Will reach out to me if there are further access concerns with Trulicity or other medications  HTN -Continue to check home BP daily and record; will check in with patient regularly until she can be seen by HTN clinic  Follow-up:  Telephone follow-up 4/26 to check in on home BG and BP  Lenna Gilford, PharmD, DPLA

## 2022-08-14 ENCOUNTER — Ambulatory Visit: Payer: Medicaid Other | Attending: Pulmonary Disease

## 2022-08-14 DIAGNOSIS — R0602 Shortness of breath: Secondary | ICD-10-CM | POA: Diagnosis not present

## 2022-08-14 LAB — BLOOD GAS, ARTERIAL
Acid-Base Excess: 3.5 mmol/L — ABNORMAL HIGH (ref 0.0–2.0)
Bicarbonate: 27.8 mmol/L (ref 20.0–28.0)
FIO2: 0.21 %
O2 Saturation: 96.8 %
Patient temperature: 37
pCO2 arterial: 40 mmHg (ref 32–48)
pH, Arterial: 7.45 (ref 7.35–7.45)
pO2, Arterial: 78 mmHg — ABNORMAL LOW (ref 83–108)

## 2022-08-21 ENCOUNTER — Telehealth: Payer: Self-pay

## 2022-08-21 ENCOUNTER — Other Ambulatory Visit: Payer: Medicaid Other

## 2022-08-21 NOTE — Progress Notes (Signed)
ERROR

## 2022-08-21 NOTE — Progress Notes (Signed)
   08/21/2022  Patient ID: Cheryl Chandler, female   DOB: 1966-02-13, 57 y.o.   MRN: [id number]  Subjective/Objective:  DM -Home FBG 108 -Any s/sx of hypo/hyperglycemia- no -Tolerating Trulicity dose well  HTN -Most recent clinic BP 4/16 124/70 -Home BP varies:  185/85, 175/75, 112/50 -Has continued to take two tablets of hydralazine 25mg  TID as well as all other medications as prescribed -Verified patient using proper technique, including sitting in rested position for several minutes, feet flat on the floor, and no talking while measuring BP -States her upper arm automated cuff is several years old  Assessment/Plan:  DM -BG is controlled at this time -Recommend to continue daily monitoring and recording -Continue current regimen and regular follow-up with PCP  HTN -BP is uncontrolled -Patient has a follow-up with cardiology beginning of June; recommended she take her home BP monitor to validate accuracy against office reading -Will give Dr. Mariah Milling a head's up; and if needed, I can assist with obtaining new monitor for her  Follow-up: As needed per PCP  Lenna Gilford, PharmD, DPLA

## 2022-08-24 ENCOUNTER — Other Ambulatory Visit: Payer: Medicaid Other | Admitting: Licensed Clinical Social Worker

## 2022-08-25 DIAGNOSIS — G4736 Sleep related hypoventilation in conditions classified elsewhere: Secondary | ICD-10-CM | POA: Diagnosis not present

## 2022-08-25 DIAGNOSIS — J449 Chronic obstructive pulmonary disease, unspecified: Secondary | ICD-10-CM | POA: Diagnosis not present

## 2022-08-25 NOTE — Patient Outreach (Signed)
Medicaid Managed Care Social Work Note  08/24/2022 Name:  Cheryl Chandler MRN:  [id number] DOB:  1965-06-11  Cheryl Chandler is an 57 y.o. year old female who is a primary patient of [name], NP.  The Medicaid Managed Care Coordination team was consulted for assistance with:  Mental Health Counseling and Resources  Cheryl Chandler was given information about Medicaid Managed Care Coordination team services today. Cheryl Chandler Patient agreed to services and verbal consent obtained.  Engaged with patient  for by telephone forfollow up visit in response to referral for case management and/or care coordination services.   Assessments/Interventions:  Review of past medical history, allergies, medications, health status, including review of consultants reports, laboratory and other test data, was performed as part of comprehensive evaluation and provision of chronic care management services.  SDOH: (Social Determinant of Health) assessments and interventions performed: [name]    Flowsheet Row Patient Outreach Telephone from 08/24/2022 in [place] [facility] Office Visit from 08/03/2022 in [facility] Patient Outreach Telephone from 07/28/2022 in [place] POPULATION HEALTH DEPARTMENT Patient Outreach Telephone from 07/20/2022 in [place] POPULATION HEALTH DEPARTMENT Patient Outreach Telephone from 06/25/2022 in [place] POPULATION HEALTH DEPARTMENT Patient Outreach Telephone from 06/18/2022 in [place] POPULATION HEALTH DEPARTMENT  [name]        Housing Interventions -- -- -- -- -- Intervention Not Indicated  Utilities Interventions -- -- -- -- -- Intervention Not Indicated  Alcohol Usage Interventions -- -- -- Intervention Not Indicated (Score <7) -- --  Depression Interventions/Treatment  -- Medication, Currently on Treatment -- -- -- --  Stress Interventions Offered [employer],  Provide Counseling  [Recent grief triggers] -- [employer], Provide Counseling -- Intervention Not Indicated --       Advanced Directives Status:  Not addressed in this encounter.  Care Plan                 Allergies  Allergen Reactions   Codeine Shortness Of Breath and Rash   Percocet [Oxycodone-Acetaminophen] Shortness Of Breath   Sulfa Antibiotics Shortness Of Breath and Rash   Aspirin Hives   Bee Venom     Bee stings   Darvon [Propoxyphene]     Darvocet-N 100   Latex    Oxycodone Nausea And Vomiting   Penicillins     Has patient had a PCN reaction causing immediate rash, facial/tongue/throat swelling, SOB or lightheadedness with hypotension: Unknown Has patient had a PCN reaction causing severe rash involving mucus membranes or skin necrosis: Unknown Has patient had a PCN reaction that required hospitalization: Unknown Has patient had a PCN reaction occurring within the last 10 years: Unknown If all of the above answers are "NO", then may proceed with Cephalosporin use.    Medications Reviewed Today     Reviewed by Lenna Gilford, [facility] (Pharmacist) on 08/13/22 at 1456  Med List Status: <None>   Medication Order Taking? Sig Documenting Provider Last Dose Status Informant  ACCU-CHEK GUIDE test strip [id number] Yes USE TO CHECK BLOOD SUGAR 3 TIMES DAILY AS DIRECTED [name], NP Taking Active   Accu-Chek Softclix Lancets lancets [id number] Yes CHECK BLOOD SUGAR TWICE DAILY [name], NP Taking Active   albuterol (PROVENTIL) (2.5 MG/3ML) 0.083% nebulizer solution [id number]  USE 1 VIAL VIA NEBULIZER EVERY 4 HOURS AS NEEDED [name], NP  Active   amLODipine (NORVASC) 5 MG tablet [id number] Yes TAKE 1 TABLET BY MOUTH ONCE DAILY  [name], NP Taking Active   atorvastatin (LIPITOR) 80 MG tablet [id number]  TAKE 1 TABLET BY MOUTH ONCE EVERY Charolotte Eke, NP  Active   Blood Glucose Monitoring Suppl (ACCU-CHEK GUIDE)  w/Device KIT [id number] Yes Use to check blood sugar 3 times a day. Aura Dials T, NP Taking Active   Blood Pressure Monitoring (OMRON 3 SERIES BP MONITOR) DEVI [id number] Yes Use to check blood pressure as directed [name], NP Taking Active   citalopram (CELEXA) 40 MG tablet [id number]  TAKE 1 TABLET BY MOUTH ONCE DAILY [name], NP  Active   diphenhydrAMINE (BENADRYL) 25 mg capsule [id number]  Take 25 mg by mouth every 6 (six) hours as needed for itching. Taking once a day [provider]  Active Self  EPINEPHrine 0.3 mg/0.3 mL IJ SOAJ injection [id number]  INJECT 1 SYRINGE INTO OUTER THIGH ONCE AS NEEDED FOR SEVERE ALLERGIC REACTION. [name], NP  Active   Fluticasone-Umeclidin-Vilant (TRELEGY ELLIPTA) 100-62.5-25 MCG/ACT AEPB [id number]  Inhale 1 puff into the lungs daily. Salena Saner, MD  Active   furosemide (LASIX) 20 MG tablet [id number] Yes TAKE 1 TABLET BY MOUTH ONCE DAILY IN AFTERNOON AS NEEDED LEG EDEMA [name], NP Taking Active            Med Note Littie Deeds, CHERYL A   Fri Aug 07, 2022  9:18 AM) Taking every day  gabapentin (NEURONTIN) 300 MG capsule [id number]  TAKE 1 CAPSULE BY MOUTH 3 TIMES DAILY ASNEEDED (NEED OFFICE VISIT FOR FURTHER REFILLS) [name], NP  Active   hydrALAZINE (APRESOLINE) 50 MG tablet [id number] Yes Take 1 tablet (50 mg total) by mouth 3 (three) times daily. [name], NP Taking Active            Med Note Littie Deeds, CHERYL A   Wed Aug 12, 2022  1:51 PM)    hydrOXYzine (VISTARIL) 25 MG capsule [id number]  Take 1 capsule (25 mg total) by mouth every 8 (eight) hours as needed. [name], NP  Active   levothyroxine (SYNTHROID) 50 MCG tablet [id number]  TAKE 1 TABLET BY MOUTH ONCE DAILY ON AN EMPTY STOMACH. WAIT 30 MINUTES BEFORE TAKING OTHER MEDS. [name], NP  Active   lisinopril (ZESTRIL) 40 MG tablet [id number] Yes TAKE 1 TABLET BY MOUTH ONCE DAILY [name], NP Taking Active    meloxicam (MOBIC) 15 MG tablet [id number]  TAKE 1 TABLET BY MOUTH ONCE DAILY AS NEEDED WITH FOOD OR MILK [name], NP  Active            Med Note Littie Deeds, CHERYL A   Fri Aug 07, 2022  9:13 AM) Taking daily  montelukast (SINGULAIR) 10 MG tablet [id number]  TAKE 1 TABLET BY MOUTH AT BEDTIME [name], NP  Active   nitroGLYCERIN (NITROSTAT) 0.4 MG SL tablet [id number]  DISSOLVE 1 TABLET UNDER TONGUE AT ONSET OF CHEST PAIN. REPEAT IN 5 MIN IF NOT RESOLVED, MAX 3 DOSES. 911 IF NEEDED. [name], NP  Active   omeprazole (PRILOSEC) 20 MG capsule [id number]  TAKE 1 CAPSULE BY MOUTH ONCE DAILY [name], NP  Active   OXYGEN [id number]  Inhale 3 L into the lungs daily. [provider]  Active   spironolactone (ALDACTONE) 25 MG tablet [id number] Yes Take 0.5 tablets (12.5 mg total) by mouth daily. [name], NP Taking Active   TRULICITY 0.75 MG/0.5ML Namon Cirri [id number] Yes INJECT 0.75MG  SUBQ ONCE A WEEK [name], NP Taking Active   Vitamin D, Ergocalciferol, (DRISDOL) 1.25  MG (50000 UNIT) CAPS capsule [id number]  TAKE 1 CAPSULE BY MOUTH EVERY 7 DAYS [name], NP  Active             Patient Active Problem List   Diagnosis Date Noted   Moderate episode of recurrent major depressive disorder (HCC) 08/03/2022   Controlled type 2 diabetes mellitus with diabetic polyneuropathy, without long-term current use of insulin (HCC) 08/03/2022   Venous ulcer (HCC) 05/03/2022   Foot pain, right 09/01/2021   Fibrocystic disease of both breasts 05/07/2019   IFG (impaired fasting glucose) 05/03/2019   Chronic venous insufficiency 11/09/2018   Overactive bladder 09/30/2018   Peripheral neuropathy 09/30/2018   Apnea 01/02/2016   Lymphedema 11/30/2014   Dyspnea on exertion 11/30/2014   Anxiety 11/29/2014   Arthritis 11/29/2014   COPD (chronic obstructive pulmonary disease) (HCC) 11/29/2014   Clinical depression 11/29/2014   H/O eating disorder 11/29/2014    Esophageal reflux 11/29/2014   Acne inversa 11/29/2014   Essential hypertension 11/29/2014   HLD (hyperlipidemia) 11/29/2014   Adult hypothyroidism 11/29/2014   Insomnia 11/29/2014   Low back pain 11/29/2014   Morbid obesity (HCC) 11/29/2014   Posttraumatic stress disorder 11/29/2014   Vitamin D deficiency 11/29/2014   Nicotine dependence, cigarettes, uncomplicated 11/29/2014    Conditions to be addressed/monitored per PCP order:  Depression and Grief  Care Plan : General Social Work (Adult)  Updates made by Gustavus Bryant, LCSW since 08/25/2022 12:00 AM     Problem: Coping Skills (General Plan of Care)      Long-Range Goal: Coping Skills Enhanced   Start Date: 11/07/2020  Expected End Date: 03/07/2022  Recent Progress: On track  Priority: High  Note:   Timeframe:  Long-Range Goal Priority:  High Start Date:       08/26/21                 Expected End Date:  ongoing  Follow up date- 09/23/22 at 245 pm   Current Barriers:  Financial constraints Mental Health Concerns  Social Isolation Limited education about mental health support resources that are available to her within the local area* Cognitive Deficits Lacks knowledge of community resource   Clinical Social Work Clinical Goal(s):  Over the next 90 days, client will work with SW to address concerns related to experiencing ongoing symptoms of depression, sadness and grief since the passing of her mother Over the next 90 days, patient will work with LCSW to address needs related to implementing appropriate self-care and depression management.    Interventions: Patient interviewed and appropriate assessments performed Provided mental health counseling with regards to patient's loss and ongoing stressors. Patient was very close with her mother who passed away a few years ago and she continues to experience symptoms of grief from this loss. Patient denies wanting LCSW to make a referral for grief counseling through  [facility] again but was receptive to coping skill and resource education/information that was provided during session today. Patient reports decorating for the holidays helps her to connect with her mother as they did this together when she was alive.  Provided patient with information about managing depression within her daily life. Patient has ongoing grief symptoms as well. Patient states "I feel worn down." Patient reports that she talks out loud to her mother's picture and this is very helpful to her. Grief support resource education provided again during session today.  Provided reflective listening and implemented appropriate interventions to help support patient and her emotional needs  Discussed plans with patient for ongoing care management follow up and provided patient with direct contact information for [facility] team Advised patient to contact [facility] team with any urgent case management needs. Assisted patient/caregiver with obtaining information about health plan benefits LCSW completed past referral for family to have a wheelchair ramp built at their residence due to ongoing mobility concerns. Ramp has been successfully installed.  PCS actively involved with patient and providing ongoing services.  Positive reinforcement provided to patient for quitting smoking cigarettes. Coping skill review education provided for future triggers.  Brief self-care education provided to both patient and spouse. Patient confirms that she continues to go to food pantry for food insecurity. Resource information has been provided.  Patient reports ongoing pain and issues with mobility. Patient shares that her pain resides in her pains and legs. Emotional support and coping skill education provided. Patient confirms that her PCS aide continues to provide her with services. However, aide has to be reminded to put on her mask since patient is high risk. LCSW reviewed resources that [name] provided her with in the  past.  Patient reports that she had a wonderful birthday spent with her family this past May. Patient reports receiving appropriate socialization. Patient reports that her spouse took her out to eat and gifted her with several things which was a nice surprise.  Patient confirms stable transportation arrangements for upcoming PCP appointment this week.  Patient repots that she is losing a lot of weight but is not sure how much she has lost as she does not have a scale. Patient reports that a goal of hers is to lose weight but she has not been intentionally losing weight at this time.  Patient reports that her aide [place] continues to assist her with PCS and is an excellent support within the home for her and spouse.  Patient planted a tomato plant and successfully grew over 5 tomatoes over the summer. Patient is very proud of this achievement as this was her first time planting a tomato plant and is now interested in picking up gardening/planting as a hobby. Patient reports that her aunt continues to be a positive support for her in her life and someone that she can rely on in the case of emergencies.  Patient was educated on healthy self-care skills and advised her to use these into her daily routine.  04/10/21- Update- Patient is agreeable to social work closure at this time as she still does not need medication management or counseling as she is managing her mental health well at this time. Patient talks to her younger sister everyday to gain emotional support. Patient says that her sister is into body building and helps to educate and remind her to do her own self-care.  Patient reports that she has been elevating her legs daily to elevate pain and swelling.  [facility] LCSW completed care coordination with Osa Craver on 01/15/21 and [facility] LCSW provided the following suggested resource-It is likely their best option given their loss of housing and lack of consistent income:   Technical sales engineer                                                                                                                       (  Emergency Crisis Housing) [phone] Ext. 104 M-F, 8am - 4pm Patient was successfully approved for food stamps but is still experiencing financial concerns. She reports that she and her husband have no clothes that fit them and they are having to start using a string for their pants and patient only has one bra at this time. [facility] LCSW will send referral to [facility] BSW today for financial support.  BSW spoke with patient about resources needed. Patient states she does receive SSI and was approved for foodstamps. Patient is familiar with all of the food pantries and other resources in [place]. BSW will assist patient with finding an eye doctor. Patient states she has not been in 10 years. BSW will research and put a list of eye doctors together that accept Medicaid in [facility]. 04/30/20: BSW contacted and spoke with patient, she stated she was not doing good because she found out that she has a new landlord and they are going up on her rent. BSW informed patient to make sure she informs her social workers at [employer] that they have an increase for foodstamps and Medicaid. Patient stated that she sleeps on the couch and is in need of a power recliner/lift chair. BSW will send patients PCP a message asking for a script to be sent. 05/21/21: BSW completed follow up telephone call with patient. She states she is unsure if their new landlord has received their January rent. Patient states she is also worried due to the new landlord making them pay rent online and on the 1st of the month. Patient states she is unable to get anyone on the phone. Patient states her rent will go up to 400 from 255 in March. 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 No other resources needed at this time.   06/20/21: BSW completed follow up  telelphone call with patient. Patient states she is doing well. Patient states Rolene Arbour will cover all of Vaccines. Patient states no resources are needed at this time. 08/26/21: BSW completed telephone outreach with patient, she stated since going to the hospital she has been feeling weak. Patient states other than that she is feeling okay. Patient stated she is going to get a new phone on Wednesday. Patient states her birthday is on Friday and her husband may take her to a [place]. No other resources are needed at this time.  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 Car is doing better, they are still able to drive and get around. Patient states husbands dad has been helping them with there transportation and bills. They landlord has gone up to 600 on lot rent, patient states they are going to have to sell there home and move.  Update- Patient reports experiencing additional grief lately. [facility] LCSW offered referral for berievement counseling but she declined. UPDATE 08/26/21- Patient reports that she has a near death experience last month. Patient shares that she had a fall. 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 [facility] LCSW completed education on available mental health resources within her area to utilize. Patient admits to experiencing some sadness due to grief from her mother's passing. Emotional support provided. 10/13/21 UPDATE- Patient reports that she is working hard on living a healthier life both physically and mentally. Patient reports that  she is making active changes in her diet. She reports that she eventually wishes to quit smoking again and has started picking up new hobbies to implement during times of craving such as coloring, crocheting and being creative. Patient reports that she is unsure if she needs mental health  support at this time because her main concerns are centered around her and her spouse's physical health instead their mental health. [facility] LCSW educated patient on how they affect one another and their importance. Patient denies any urgent concerns. She reports no issues with affording food due to food stamp assistance. [facility] LCSW will reassess social work needs 30 days from now. UPDATE 11/17/21- Patient reports that she is experiencing several stressors at this time. She was encouraged to consider mental health treatment but she is not interested at this time. Patient was encouraged to consider mental health resources. Patient reports that their car is in the shop and that they have no stable transportation besides their sister and her husband's father. Patient reports that her spouse has had several recent health issues that have affected her mental state. Patient was educated on healthy coping skills for stress. Patient denies no urgent concerns or issues at this time. UPDATE- Care Coordination completed with [facility] LCSW, PCP office, Pharmacist, [facility] BSW and PCP. Patient has canceled several medical appointments because they are unable to afford a copay. Patient will benefit from financial assistance and may benefit from a [facility] application. Patient has an upcoming appointment with [facility] BSW next week. Patient is agreeable to exploring financial assistance options with [facility] BSW that will help alleviate other areas of financial stress within their life in order for patient to be able to afford medical expenses. [facility] LCSW encouraged patient to consider individual counseling. Patient is agreeable to referral for counseling but needs to find out her sister's email first as she is unable to go to office to fill out intake paperwork. Patient prefers virtual counseling to save money on transportation. [facility] LCSW will successfully place referral to [facility] during next outreach. [facility] LCSW completed care coordination with  [facility] BSW.  BSW 12/15/21: BSW completed a telephone outreach with patient. She stated everything is still going okay. She has been having problems with her leg wraps and has an appointment today. She states her husband is still having issues with his eye and with the doctor and she is going to make a compliant. Patient states no resources are needed at this time. [facility] LCSW 01/19/22: Patient reports that she was unable to sleep last night. She is experiencing a break out skin reaction that she feels is caused by stress. Her financial stressors have increased since last session. Patient is agreeable to [facility] LCSW placing referral for [facility] now but reports that she has no email address. [facility] LCSW completed referral and made note of her inability to get into her email. [facility] LCSW will follow up within two weeks to ensure that she was enrolled in [facility] counseling program. [facility] completed care coordination call ONLY to [facility] and they were unable to locate patient's referral that was made on 01/19/22. [facility] LCSW resubmitted this referral today on 01/29/22 for telephonic counseling. Patient was made aware by telephone call that [facility] will contact her by the end of the day tomorrow to schedule a time for her to come into their office to complete in person intake paperwork. Patient is agreeable to this plan. UPDATE 02/03/22- Patient reports that she missed a  call from June the receptionist at [facility] and call her back but has not heard back yet. [facility] LCSW provided patient with her number and directions on which prompt to enter to be directed to June. Patient will ask June about her phone as patient is unsure if she can make video calls and complete telephonic therapy which would mean she would need to go for in person sessions. Patient is agreeable to plan. Patient was able to find an assistance program for rent through [facility] on her own. Positive reinforcement provided  for this resource find. [facility] LCSW collaborated with entire team. [facility] LCSW will follow up in two weeks to ensure that patient was able to get connected with either telephonic or in person therapy services at [facility]. UPDATE 02/23/22-Patient has decided to decline therapy at this time stating that she "just has too much going on to add in anything else into her schedule." [facility] LCSW will update team on this decision. Reflective and active listening provided during session. [facility] LCSW will follow up in 90 days to ask patient again about mental health support involvement. Patient is agreeable to this plan. 04/24/22- Patient reports that she is still experiencing a skin reaction from off brand dish soap that she used. She reports that this has been going on for months. Healthy self-care education provided to patient to promote healthy hygiene. Patient reports that she is still not interested in counseling right now but confirms that she is stable. She reports that she is now on oxygen which she started back in the fall of 2023. She shares that she has NOT been smoking. Positive reinforcement provided for this. Patient reports that she put up two Christmas trees this year and one was her mother's. Patient reports that her mother's Christmas tree serves as a reminder of the love and special relationship that she had with her mother. [facility] LCSW 06/25/22 update- Patient reports that she is stable and her emotions/feelings have been more manageable lately but she and her spouse are still struggling financially. Patient still declines wanting a referral for a long term therapist so that she can help manage these financial stressors that trigger her stress. Patient is aware of [facility] resource that she was very close to starting therapy with in 2023. Stress management education provided. Patient is agreeable to a 60 day follow up call. 07/28/22- Patient shares that her depression has lifted recently but she is worried  as the anniversary of her moms' passing is coming soon. [facility] LCSW provided reeducation on her available resources. She was educated on healthy self-care and reports that she is able to get outside today to get some Vitamin D and spend time with her nieces, nephews and sister. Positive reinforcement provided. 08/24/22- Patient has been experiencing recent grief stressors and was tearful during social work session. Patient was strongly encouraged to consider grief therapy as her uncle buck and aunt sissy are both in critical care at the hospital. [facility] LCSW provided extensive emotional support and will do a 30 day follow up call due to recent additional stressors.   Past BSW update-BSW completed a telephone outreach with patient. She stated she is still itching really bad and thinks she has shingles. She has put up her Christmas decorations and has 2 trees. Patient states she has been stressed about medical bills, but the holidays are keeping her in good spirits. Patient states they are still having financial issues.  BSW completed a telephone outreach with patient. She  stated she is doing well. They were able to go to a wholesale store in [place] and get some food. Patient stated she has some appointments coming up including the skin doctor but that is not until November. Patient stated she is on the list for a sooner appointment if someone cancels. BSW contacted other skin centers in the area and they do not accept patients insurance. No other resources are needed at this time. BSW completed a telephone outreach with patient, she stated they may start looking for somewhere else to live due to the rent rising in. Patient stated they are okay, and surviving. Patient stated no resources are needed at this time.  Managing Loss, Adult People experience loss in many different ways throughout their lives. Events such as moving, changing jobs, and losing friends can create a sense of loss. The loss may be as serious as a  major health change, divorce, death of a pet, or death of a loved one. All of these types of loss are likely to create a physical and emotional reaction known as grief. Grief is the result of a major change or an absence of something or someone that you count on. Grief is a normal reaction to loss. A variety of factors can affect your grieving experience, including: The nature of your loss. Your relationship to what or whom you lost. Your understanding of grief and how to manage it. Your support system. How to manage lifestyle changes Keep to your normal routine as much as possible. If you have trouble focusing or doing normal activities, it is acceptable to take some time away from your normal routine. Spend time with friends and loved ones. Eat a healthy diet, get plenty of sleep, and rest when you feel tired. How to recognize changes  The way that you deal with your grief will affect your ability to function as you normally do. When grieving, you may experience these changes: Numbness, shock, sadness, anxiety, anger, denial, and guilt. Thoughts about death. Unexpected crying. A physical sensation of emptiness in your stomach. Problems sleeping and eating. Tiredness (fatigue). Loss of interest in normal activities. Dreaming about or imagining seeing the person who died. A need to remember what or whom you lost. Difficulty thinking about anything other than your loss for a period of time. Relief. If you have been expecting the loss for a while, you may feel a sense of relief when it happens. Follow these instructions at home:    Activity Express your feelings in healthy ways, such as: Talking with others about your loss. It may be helpful to find others who have had a similar loss, such as a support group. Writing down your feelings in a journal. Doing physical activities to release stress and emotional energy. Doing creative activities like painting, sculpting, or playing or  listening to music. Practicing resilience. This is the ability to recover and adjust after facing challenges. Reading some resources that encourage resilience may help you to learn ways to practice those behaviors. General instructions Be patient with yourself and others. Allow the grieving process to happen, and remember that grieving takes time. It is likely that you may never feel completely done with some grief. You may find a way to move on while still cherishing memories and feelings about your loss. Accepting your loss is a process. It can take months or longer to adjust. Keep all follow-up visits as told by your health care provider. This is important. Where to find support To get  support for managing loss: Ask your health care provider for help and recommendations, such as grief counseling or therapy. Think about joining a support group for people who are managing a loss. Where to find more information You can find more information about managing loss from: American Society of Clinical Oncology: www.cancer.net American Psychological Association: [employer].ca Contact a health care provider if: Your grief is extreme and keeps getting worse. You have ongoing grief that does not improve. Your body shows symptoms of grief, such as illness. You feel depressed, anxious, or lonely. Get help right away if: You have thoughts about hurting yourself or others. If you ever feel like you may hurt yourself or others, or have thoughts about taking your own life, get help right away. You can go to your nearest emergency department or call: Your local emergency services (911 in the [place] A suicide crisis helpline, such as the National Suicide Prevention Lifeline at (517)650-2858. This is open 24 hours a day. Summary Grief is the result of a major change or an absence of someone or something that you count on. Grief is a normal reaction to loss. The depth of grief and the period of recovery depend  on the type of loss and your ability to adjust to the change and process your feelings. Processing grief requires patience and a willingness to accept your feelings and talk about your loss with people who are supportive. It is important to find resources that work for you and to realize that people experience grief differently. There is not one grieving process that works for everyone in the same way. Be aware that when grief becomes extreme, it can lead to more severe issues like isolation, depression, anxiety, or suicidal thoughts. Talk with your health care provider if you have any of these issues. This information is not intended to replace advice given to you by your health care provider. Make sure you discuss any questions you have with your health care provider. Document Revised: 06/17/2018 Document Reviewed: 08/27/2016 Elsevier Patient Education  2020 [employer].  Help for Managing Grief   When you are experiencing grief, many resources and support are available to help you. You are not alone.    Grief Share (https://www.SunglassSpecialist.gl):    [employer] of [address] [place], [place]      [facility]  [phone] [address] [place], [place]     [facility] [address] [place], [place]      [facility] [address] [place], [place]  [phone]   [facility] [address] [place], [place] [phone]   [facility] [address] [place], [place]     [phone]   [facility] [address] [place], [place]     [phone]   If a Hospice or Palliative Care team has been involved in the care of your loved one, you may reach out to your contact there or locally, you may reach out to [facility]:    [facility]   [address] [place], [place] [address] [phone] Patient was educated on healthy  self-care as she admits she has little motivation to walk lately. PCS recommendation was suggested.   Patient Self Care Activities:  Attends all scheduled provider appointments Calls provider office for new concerns or questions  Please see past updates related to this goal by clicking on  the "Past Updates" button in the selected goal        Follow up:  Patient agrees to Care Plan and Follow-up.  Plan: The Managed Medicaid care management team will reach out to the patient again over the next 30 days.  [name], BSW, MSW, [employer] Medicaid LCSW [facility]  [facility] [email or web address] Phone: [phone]

## 2022-08-25 NOTE — Patient Instructions (Signed)
Visit Information  Ms. Haney was given information about Medicaid Managed Care team care coordination services as a part of their [facility] Medicaid benefit. Karysa Akaylah Lalley verbally consented to engagement with the [facility] Managed Care team.   If you are experiencing a medical emergency, please call 911 or report to your local emergency department or urgent care.   If you have a non-emergency medical problem during routine business hours, please contact your provider's office and ask to speak with a nurse.   For questions related to your [facility] health plan, please call: [phone] or go here:[email or web address]  If you would like to schedule transportation through your [facility] plan, please call the following number at least 2 days in advance of your appointment: [phone].   You can also use the MTM portal or MTM mobile app to manage your rides. Reimbursement for transportation is available through [facility]! For the portal, please go to [email or web address]  Call the [facility] Crisis Line at [phone], at any time, 24 hours a day, 7 days a week. If you are in danger or need immediate medical attention call 911.  If you would like help to quit smoking, call 1-800-QUIT-NOW (319-603-9594) OR Espaol: 1-855-Djelo-Ya (4-132-440-1027) o para ms informacin haga clic aqu or Text READY to 253-664 to register via text    Following is a copy of your plan of care:  Care Plan : General Social Work (Adult)  Updates made by Gustavus Bryant, LCSW since 08/25/2022 12:00 AM     Problem: Coping Skills (General Plan of Care)      Long-Range Goal: Coping Skills Enhanced   Start Date: 11/07/2020  Expected End Date: 03/07/2022  Recent Progress: On track  Priority: High  Note:   Timeframe:  Long-Range Goal Priority:  High Start Date:       08/26/21                 Expected End Date:  ongoing  Follow up date- 09/23/22 at 245 pm   Current Barriers:   Financial constraints Mental Health Concerns  Social Isolation Limited education about mental health support resources that are available to her within the local area* Cognitive Deficits Lacks knowledge of community resource   Clinical Social Work Clinical Goal(s):  Over the next 90 days, client will work with SW to address concerns related to experiencing ongoing symptoms of depression, sadness and grief since the passing of her mother Over the next 90 days, patient will work with LCSW to address needs related to implementing appropriate self-care and depression management.    Interventions: Patient interviewed and appropriate assessments performed Provided mental health counseling with regards to patient's loss and ongoing stressors. Patient was very close with her mother who passed away a few years ago and she continues to experience symptoms of grief from this loss. Patient denies wanting LCSW to make a referral for grief counseling through [facility] again but was receptive to coping skill and resource education/information that was provided during session today. Patient reports decorating for the holidays helps her to connect with her mother as they did this together when she was alive.  Provided patient with information about managing depression within her daily life. Patient has ongoing grief symptoms as well. Patient states "I feel worn down." Patient reports that she talks out loud to her mother's picture and this is very helpful to her. Grief support resource education provided again during session today.  Provided reflective listening and implemented appropriate interventions to help support  patient and her emotional needs  Discussed plans with patient for ongoing care management follow up and provided patient with direct contact information for [facility] team Advised patient to contact [facility] team with any urgent case management needs. Assisted patient/caregiver with obtaining information  about health plan benefits LCSW completed past referral for family to have a wheelchair ramp built at their residence due to ongoing mobility concerns. Ramp has been successfully installed.  PCS actively involved with patient and providing ongoing services.  Positive reinforcement provided to patient for quitting smoking cigarettes. Coping skill review education provided for future triggers.  Brief self-care education provided to both patient and spouse. Patient confirms that she continues to go to food pantry for food insecurity. Resource information has been provided.  Patient reports ongoing pain and issues with mobility. Patient shares that her pain resides in her pains and legs. Emotional support and coping skill education provided. Patient confirms that her PCS aide continues to provide her with services. However, aide has to be reminded to put on her mask since patient is high risk. LCSW reviewed resources that [name] provided her with in the past.  Patient reports that she had a wonderful birthday spent with her family this past May. Patient reports receiving appropriate socialization. Patient reports that her spouse took her out to eat and gifted her with several things which was a nice surprise.  Patient confirms stable transportation arrangements for upcoming PCP appointment this week.  Patient repots that she is losing a lot of weight but is not sure how much she has lost as she does not have a scale. Patient reports that a goal of hers is to lose weight but she has not been intentionally losing weight at this time.  Patient reports that her aide [place] continues to assist her with PCS and is an excellent support within the home for her and spouse.  Patient planted a tomato plant and successfully grew over 5 tomatoes over the summer. Patient is very proud of this achievement as this was her first time planting a tomato plant and is now interested in picking up gardening/planting as a  hobby. Patient reports that her aunt continues to be a positive support for her in her life and someone that she can rely on in the case of emergencies.  Patient was educated on healthy self-care skills and advised her to use these into her daily routine.  04/10/21- Update- Patient is agreeable to social work closure at this time as she still does not need medication management or counseling as she is managing her mental health well at this time. Patient talks to her younger sister everyday to gain emotional support. Patient says that her sister is into body building and helps to educate and remind her to do her own self-care.  Patient reports that she has been elevating her legs daily to elevate pain and swelling.  [facility] LCSW completed care coordination with Osa Craver on 01/15/21 and [facility] LCSW provided the following suggested resource-It is likely their best option given their loss of housing and lack of consistent income:   Research scientist (physical sciences)                                                                                                                       (  Emergency Crisis Housing) [phone] Ext. 104 M-F, 8am - 4pm Patient was successfully approved for food stamps but is still experiencing financial concerns. She reports that she and her husband have no clothes that fit them and they are having to start using a string for their pants and patient only has one bra at this time. [facility] LCSW will send referral to [facility] BSW today for financial support.  BSW spoke with patient about resources needed. Patient states she does receive SSI and was approved for foodstamps. Patient is familiar with all of the food pantries and other resources in [place]. BSW will assist patient with finding an eye doctor. Patient states she has not been in 10 years. BSW will research and put a list of eye doctors together that accept Medicaid in [facility]. 04/30/20: BSW contacted and spoke with  patient, she stated she was not doing good because she found out that she has a new landlord and they are going up on her rent. BSW informed patient to make sure she informs her social workers at [employer] that they have an increase for foodstamps and Medicaid. Patient stated that she sleeps on the couch and is in need of a power recliner/lift chair. BSW will send patients PCP a message asking for a script to be sent. 05/21/21: BSW completed follow up telephone call with patient. She states she is unsure if their new landlord has received their January rent. Patient states she is also worried due to the new landlord making them pay rent online and on the 1st of the month. Patient states she is unable to get anyone on the phone. Patient states her rent will go up to 400 from 255 in March. 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 No other resources needed at this time.   06/20/21: BSW completed follow up telelphone call with patient. Patient states she is doing well. Patient states Rolene Arbour will cover all of Vaccines. Patient states no resources are needed at this time. 08/26/21: BSW completed telephone outreach with patient, she stated since going to the hospital she has been feeling weak. Patient states other than that she is feeling okay. Patient stated she is going to get a new phone on Wednesday. Patient states her birthday is on Friday and her husband may take her to a [place]. No other resources are needed at this time.  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 Car is doing better, they are still able to drive and get around. Patient states husbands dad has been helping them with there transportation and bills. They landlord has gone up to 600 on lot rent, patient states they are  going to have to sell there home and move.  Update- Patient reports experiencing additional grief lately. [facility] LCSW offered referral for berievement counseling but she declined. UPDATE 08/26/21- Patient reports that she has a near death experience last month. Patient shares that she had a fall. 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 [facility] LCSW completed education on available mental health resources within her area to utilize. Patient admits to experiencing some sadness due to grief from her mother's passing. Emotional support provided. 10/13/21 UPDATE- Patient reports that she is working hard on living a healthier life both physically and mentally. Patient reports  that she is making active changes in her diet. She reports that she eventually wishes to quit smoking again and has started picking up new hobbies to implement during times of craving such as coloring, crocheting and being creative. Patient reports that she is unsure if she needs mental health support at this time because her main concerns are centered around her and her spouse's physical health instead their mental health. [facility] LCSW educated patient on how they affect one another and their importance. Patient denies any urgent concerns. She reports no issues with affording food due to food stamp assistance. [facility] LCSW will reassess social work needs 30 days from now. UPDATE 11/17/21- Patient reports that she is experiencing several stressors at this time. She was encouraged to consider mental health treatment but she is not interested at this time. Patient was encouraged to consider mental health resources. Patient reports that their car is in the shop and that they have no stable transportation besides their sister and her husband's father. Patient reports that her spouse has had several recent health issues that have affected her mental state. Patient was educated on healthy coping skills for stress. Patient denies no urgent concerns or issues at  this time. UPDATE- Care Coordination completed with [facility] LCSW, PCP office, Pharmacist, [facility] BSW and PCP. Patient has canceled several medical appointments because they are unable to afford a copay. Patient will benefit from financial assistance and may benefit from a [facility] application. Patient has an upcoming appointment with [facility] BSW next week. Patient is agreeable to exploring financial assistance options with [facility] BSW that will help alleviate other areas of financial stress within their life in order for patient to be able to afford medical expenses. [facility] LCSW encouraged patient to consider individual counseling. Patient is agreeable to referral for counseling but needs to find out her sister's email first as she is unable to go to office to fill out intake paperwork. Patient prefers virtual counseling to save money on transportation. [facility] LCSW will successfully place referral to [facility] during next outreach. [facility] LCSW completed care coordination with [facility] BSW.  BSW 12/15/21: BSW completed a telephone outreach with patient. She stated everything is still going okay. She has been having problems with her leg wraps and has an appointment today. She states her husband is still having issues with his eye and with the doctor and she is going to make a compliant. Patient states no resources are needed at this time. [facility] LCSW 01/19/22: Patient reports that she was unable to sleep last night. She is experiencing a break out skin reaction that she feels is caused by stress. Her financial stressors have increased since last session. Patient is agreeable to [facility] LCSW placing referral for [facility] now but reports that she has no email address. [facility] LCSW completed referral and made note of her inability to get into her email. [facility] LCSW will follow up within two weeks to ensure that she was enrolled in [facility] counseling program. [facility] LCSW completed care coordination call ONLY to [facility]  and they were unable to locate patient's referral that was made on 01/19/22. [facility] LCSW resubmitted this referral today on 01/29/22 for telephonic counseling. Patient was made aware by telephone call that [facility] will contact her by the end of the day tomorrow to schedule a time for her to come into their office to complete in person intake paperwork. Patient is agreeable to this plan. UPDATE 02/03/22- Patient reports that she missed  a call from June the receptionist at [facility] and call her back but has not heard back yet. [facility] LCSW provided patient with her number and directions on which prompt to enter to be directed to June. Patient will ask June about her phone as patient is unsure if she can make video calls and complete telephonic therapy which would mean she would need to go for in person sessions. Patient is agreeable to plan. Patient was able to find an assistance program for rent through [facility] on her own. Positive reinforcement provided for this resource find. [facility] LCSW collaborated with entire team. [facility] LCSW will follow up in two weeks to ensure that patient was able to get connected with either telephonic or in person therapy services at [facility]. UPDATE 02/23/22-Patient has decided to decline therapy at this time stating that she "just has too much going on to add in anything else into her schedule." [facility] LCSW will update team on this decision. Reflective and active listening provided during session. [facility] LCSW will follow up in 90 days to ask patient again about mental health support involvement. Patient is agreeable to this plan. 04/24/22- Patient reports that she is still experiencing a skin reaction from off brand dish soap that she used. She reports that this has been going on for months. Healthy self-care education provided to patient to promote healthy hygiene. Patient reports that she is still not interested in counseling right now but confirms that she is stable. She  reports that she is now on oxygen which she started back in the fall of 2023. She shares that she has NOT been smoking. Positive reinforcement provided for this. Patient reports that she put up two Christmas trees this year and one was her mother's. Patient reports that her mother's Christmas tree serves as a reminder of the love and special relationship that she had with her mother. [facility] LCSW 06/25/22 update- Patient reports that she is stable and her emotions/feelings have been more manageable lately but she and her spouse are still struggling financially. Patient still declines wanting a referral for a long term therapist so that she can help manage these financial stressors that trigger her stress. Patient is aware of [facility] resource that she was very close to starting therapy with in 2023. Stress management education provided. Patient is agreeable to a 60 day follow up call. 07/28/22- Patient shares that her depression has lifted recently but she is worried as the anniversary of her moms' passing is coming soon. [facility] LCSW provided reeducation on her available resources. She was educated on healthy self-care and reports that she is able to get outside today to get some Vitamin D and spend time with her nieces, nephews and sister. Positive reinforcement provided. 08/24/22- Patient has been experiencing recent grief stressors and was tearful during social work session. Patient was strongly encouraged to consider grief therapy as her uncle buck and aunt sissy are both in critical care at the hospital. [facility] LCSW provided extensive emotional support and will do a 30 day follow up call due to recent additional stressors.   Past BSW update-BSW completed a telephone outreach with patient. She stated she is still itching really bad and thinks she has shingles. She has put up her Christmas decorations and has 2 trees. Patient states she has been stressed about medical bills, but the holidays are keeping her in good  spirits. Patient states they are still having financial issues.  BSW completed a telephone outreach with patient.  She stated she is doing well. They were able to go to a wholesale store in [place] and get some food. Patient stated she has some appointments coming up including the skin doctor but that is not until November. Patient stated she is on the list for a sooner appointment if someone cancels. BSW contacted other skin centers in the area and they do not accept patients insurance. No other resources are needed at this time. BSW completed a telephone outreach with patient, she stated they may start looking for somewhere else to live due to the rent rising in. Patient stated they are okay, and surviving. Patient stated no resources are needed at this time.  Managing Loss, Adult People experience loss in many different ways throughout their lives. Events such as moving, changing jobs, and losing friends can create a sense of loss. The loss may be as serious as a major health change, divorce, death of a pet, or death of a loved one. All of these types of loss are likely to create a physical and emotional reaction known as grief. Grief is the result of a major change or an absence of something or someone that you count on. Grief is a normal reaction to loss. A variety of factors can affect your grieving experience, including: The nature of your loss. Your relationship to what or whom you lost. Your understanding of grief and how to manage it. Your support system. How to manage lifestyle changes Keep to your normal routine as much as possible. If you have trouble focusing or doing normal activities, it is acceptable to take some time away from your normal routine. Spend time with friends and loved ones. Eat a healthy diet, get plenty of sleep, and rest when you feel tired. How to recognize changes  The way that you deal with your grief will affect your ability to function as you normally do. When  grieving, you may experience these changes: Numbness, shock, sadness, anxiety, anger, denial, and guilt. Thoughts about death. Unexpected crying. A physical sensation of emptiness in your stomach. Problems sleeping and eating. Tiredness (fatigue). Loss of interest in normal activities. Dreaming about or imagining seeing the person who died. A need to remember what or whom you lost. Difficulty thinking about anything other than your loss for a period of time. Relief. If you have been expecting the loss for a while, you may feel a sense of relief when it happens. Follow these instructions at home:    Activity Express your feelings in healthy ways, such as: Talking with others about your loss. It may be helpful to find others who have had a similar loss, such as a support group. Writing down your feelings in a journal. Doing physical activities to release stress and emotional energy. Doing creative activities like painting, sculpting, or playing or listening to music. Practicing resilience. This is the ability to recover and adjust after facing challenges. Reading some resources that encourage resilience may help you to learn ways to practice those behaviors. General instructions Be patient with yourself and others. Allow the grieving process to happen, and remember that grieving takes time. It is likely that you may never feel completely done with some grief. You may find a way to move on while still cherishing memories and feelings about your loss. Accepting your loss is a process. It can take months or longer to adjust. Keep all follow-up visits as told by your health care provider. This is important. Where to find support To get  support for managing loss: Ask your health care provider for help and recommendations, such as grief counseling or therapy. Think about joining a support group for people who are managing a loss. Where to find more information You can find more information  about managing loss from: American Society of Clinical Oncology: www.cancer.net American Psychological Association: [employer].ca Contact a health care provider if: Your grief is extreme and keeps getting worse. You have ongoing grief that does not improve. Your body shows symptoms of grief, such as illness. You feel depressed, anxious, or lonely. Get help right away if: You have thoughts about hurting yourself or others. If you ever feel like you may hurt yourself or others, or have thoughts about taking your own life, get help right away. You can go to your nearest emergency department or call: Your local emergency services (911 in the [place] A suicide crisis helpline, such as the National Suicide Prevention Lifeline at 915-057-2500. This is open 24 hours a day. Summary Grief is the result of a major change or an absence of someone or something that you count on. Grief is a normal reaction to loss. The depth of grief and the period of recovery depend on the type of loss and your ability to adjust to the change and process your feelings. Processing grief requires patience and a willingness to accept your feelings and talk about your loss with people who are supportive. It is important to find resources that work for you and to realize that people experience grief differently. There is not one grieving process that works for everyone in the same way. Be aware that when grief becomes extreme, it can lead to more severe issues like isolation, depression, anxiety, or suicidal thoughts. Talk with your health care provider if you have any of these issues. This information is not intended to replace advice given to you by your health care provider. Make sure you discuss any questions you have with your health care provider. Document Revised: 06/17/2018 Document Reviewed: 08/27/2016 Elsevier Patient Education  2020 [employer].  Help for Managing Grief   When you are experiencing grief, many  resources and support are available to help you. You are not alone.    Grief Share ([email or web address]):    [employer] of [address] [place], [place]      [employer]  [phone] [address] [place], [place]     [facility] [address] [place], [place]      [facility] [address] [place], [place]  [phone]   [facility] [address] [place], [place] [phone]   [facility] [address] [place], [place]     [phone]   [facility] [address] [place], [place]     [phone]   If a Hospice or Palliative Care team has been involved in the care of your loved one, you may reach out to your contact there or locally, you may reach out to [facility]:    [facility]   [address] [place], [place] [address] [phone] Patient was educated on healthy self-care as she admits she has little motivation to walk lately. PCS recommendation was suggested.   Patient Self Care Activities:  Attends all scheduled provider appointments Calls provider office for new concerns or questions  Please see past updates related to this goal by clicking on  the "Past Updates" button in the selected goal

## 2022-08-26 DIAGNOSIS — Z419 Encounter for procedure for purposes other than remedying health state, unspecified: Secondary | ICD-10-CM | POA: Diagnosis not present

## 2022-09-04 ENCOUNTER — Encounter (HOSPITAL_BASED_OUTPATIENT_CLINIC_OR_DEPARTMENT_OTHER): Payer: Self-pay | Admitting: Cardiovascular Disease

## 2022-09-06 DIAGNOSIS — Z993 Dependence on wheelchair: Secondary | ICD-10-CM | POA: Diagnosis not present

## 2022-09-06 DIAGNOSIS — R3981 Functional urinary incontinence: Secondary | ICD-10-CM | POA: Diagnosis not present

## 2022-09-18 ENCOUNTER — Other Ambulatory Visit: Payer: Medicaid Other | Admitting: Obstetrics and Gynecology

## 2022-09-18 ENCOUNTER — Encounter: Payer: Self-pay | Admitting: Obstetrics and Gynecology

## 2022-09-18 NOTE — Patient Instructions (Signed)
Visit Information  Ms. Schlesser was given information about Medicaid Managed Care team care coordination services as a part of their [facility] Medicaid benefit. Geneviene Sekai Kulla verbally consented to engagement with the [facility] Managed Care team.   If you are experiencing a medical emergency, please call 911 or report to your local emergency department or urgent care.   If you have a non-emergency medical problem during routine business hours, please contact your provider's office and ask to speak with a nurse.   For questions related to your [facility] health plan, please call: [phone] or go here:[email or web address]  If you would like to schedule transportation through your [facility] plan, please call the following number at least 2 days in advance of your appointment: [phone].   You can also use the MTM portal or MTM mobile app to manage your rides. Reimbursement for transportation is available through [facility]! For the portal, please go to [email or web address]  Call the [facility] Crisis Line at [phone], at any time, 24 hours a day, 7 days a week. If you are in danger or need immediate medical attention call 911.  If you would like help to quit smoking, call 1-800-QUIT-NOW (253-627-5104) OR Espaol: 1-855-Djelo-Ya (2-725-366-4403) o para ms informacin haga clic aqu or Text READY to 474-259 to register via text  Ms. Boonstra - following are the goals we discussed in your visit today:   Goals Addressed             This Visit's Progress    RNCM: Keep Skin Clean and Dry       Follow Up Date: 12/02/22  - clean and dry skin well - keep skin dry - use a fragrance-free lotion on skin - wear a protective pad or garment    Why is this important?   Leaking urine (pee) can cause soreness from skin rashes and redness.  This is caused by skin being exposed to urine (pee).   09/18/22:  Complains of continued skin rash today-using Benadryl  and antibiotic cream-some clearing-DERM appt in November     RNCM: Track and Manage My Symptoms-Depression       Timeframe:  Long-Range Goal Priority:  High Start Date:    12-02-2020                         Expected End Date:   ongoing                Follow Up Date: 12/02/22   - avoid negative self-talk - develop a personal safety plan - develop a plan to deal with triggers like holidays, anniversaries - exercise at least 2 to 3 times per week - have a plan for how to handle bad days - journal feelings and what helps to feel better or worse - spend time or talk with others at least 2 to 3 times per week - spend time or talk with others every day - watch for early signs of feeling worse    Why is this important?   Keeping track of your progress will help your treatment team find the right mix of medicine and therapy for you.  Write in your journal every day.  Day-to-day changes in depression symptoms are normal. It may be more helpful to check your progress at the end of each week instead of every day.   09/18/22:  Continues to be followed by LCSW, declined therapy services     RNCM: Track and Manage My  Triggers-COPD       Timeframe:  Long-Range Goal Priority:  Medium Start Date:     05-22-2020                        Expected End Date:   ongoing          Follow Up Date: 12/02/22   - avoid second hand smoke - eliminate smoking in my home - identify and avoid work-related triggers - identify and remove indoor air pollutants - limit outdoor activity during cold weather - listen for public air quality announcements every day    Why is this important?   Triggers are activities or things, like tobacco smoke or cold weather, that make your COPD (chronic obstructive pulmonary disease) flare-up.  Knowing these triggers helps you plan how to stay away from them.  When you cannot remove them, you can learn how to manage them.   09/18/22:  No change-continues on 3L-recent PULM appt in April    The patient verbalized understanding of instructions, educational materials, and care plan provided today and DECLINED offer to receive copy of patient instructions, educational materials, and care plan.   The Managed Medicaid care management team will reach out to the patient again over the next 90 business  days.  The  Patient has been provided with contact information for the Managed Medicaid care management team and has been advised to call with any health related questions or concerns.   Kathi Der RN, BSN [place]  [facility] Care Management Coordinator - Managed Medicaid High Risk [phone]   Following is a copy of your plan of care:  Care Plan : RNCM: COPD (Adult)  Updates made by [name], RN since 09/18/2022 12:00 AM     Problem: RNCM: Psychological Adjustment to Diagnosis (COPD)   Priority: Medium  Onset Date: 05/22/2020     Long-Range Goal: RNCM: Adjustment to Disease Achieved   Start Date: 05/22/2020  Expected End Date: 12/19/2022  Recent Progress: On track  Priority: High  Note:   Current Barriers:  Knowledge deficits related to basic understanding of COPD disease process Knowledge deficits related to basic COPD self care/management Knowledge deficit related to basic understanding of how to use inhalers and how inhaled medications work Knowledge deficit related to importance of energy conservation Limited Social Support Unable to independently manage COPD Lacks social connections Does not contact provider office for questions/concerns  09/18/22:  patient continues on 3L of Oxygen-uses day and night as needed.  Recent PULM appt 4/15  Case Manager Clinical Goal(s): patient will report using inhalers as prescribed including rinsing mouth after use patient will be able to verbalize understanding of COPD action plan and when to seek appropriate levels of medical care  patient will engage in lite exercise as tolerated to build/regain stamina  and strength and reduce shortness of breath through activity tolerance  patient will verbalize basic understanding of COPD disease process and self care activities   Interventions:  Inter-disciplinary care team collaboration (see longitudinal plan of care) UNABLE to independently: manage COPD Provided patient with basic written and verbal COPD education on self care/management/and exacerbation prevention Provided patient with COPD action plan and reinforced importance of daily self assessment.  Provided written and verbal instructions on pursed lip breathing and utilized returned demonstration as teach back Provided instruction about proper use of medications used for management of COPD including inhalers Advised patient to self assesses COPD action plan zone and make  appointment with provider if in the yellow zone for 48 hours without improvement. Provided patient with education about the role of exercise in the management of COPD Advised patient to engage in light exercise as tolerated 3-5 days a week Provided education about and advised patient to utilize infection prevention strategies to reduce risk of respiratory infection.  Patient Goals/Self-Care Activities:  - decision-making supported - depression screen reviewed - emotional support provided - family involvement promoted - problem-solving facilitated - relaxation techniques promoted - verbalization of feelings encouraged  Follow Up Plan: Telephone follow up appointment with care management team member scheduled.   Care Plan : RNCM: Hypertension (Adult)  Updates made by [name], RN since 09/18/2022 12:00 AM     Problem: RNCM: Hypertension (Hypertension)   Priority: Medium  Onset Date: 05/22/2020     Long-Range Goal: RNCM: Hypertension Monitored   Start Date: 05/22/2020  Expected End Date: 12/19/2022  Recent Progress: On track  Priority: Medium  Note:    Current Barriers:  Knowledge Deficits related to basic  understanding of hypertension pathophysiology and self care management Knowledge Deficits related to understanding of medications prescribed for management of hypertension Limited Social Support Unable to independently manage HTN Lacks social connections Does not contact provider office for questions/concern 09/18/22:  BP 150/51 at PCP appt 4/8-[facility] referral placed as well as Pharmacy referral.  Has CARDS appt 6/4.  Patient checks BP daily-hard to discern what exact readings are-?185/85.  Case Manager Clinical Goal(s):   patient will verbalize understanding of plan for hypertension management patient will demonstrate improved adherence to prescribed treatment plan for hypertension as evidenced by taking all medications as prescribed, monitoring and recording blood pressure as directed, adhering to low sodium/DASH diet patient will demonstrate improved health management independence as evidenced by checking blood pressure as directed and notifying PCP if SBP>160 or DBP > 90, taking all medications as prescribe, and adhering to a low sodium diet as discussed.  Interventions:  Inter-disciplinary care team collaboration (see longitudinal plan of care) Evaluation of current treatment plan related to hypertension self management and patient's adherence to plan as established by provider.  Provided education to patient re: stroke prevention, s/s of heart attack and stroke, DASH diet, complications of uncontrolled blood pressure Reviewed medications with patient and discussed importance of compliance.  Discussed plans with patient for ongoing care management follow up and provided patient with direct contact information for care management team Advised patient, providing education and rationale, to monitor blood pressure daily and record, calling PCP for findings outside established parameters.   Patient Goals/Self-Care Activities Over the next 120 days, patient will:  - Self  administers medications as prescribed Attends all scheduled provider appointments Calls provider office for new concerns, questions, or BP outside discussed parameters Checks BP and records as discussed Follows a low sodium diet/DASH diet - blood pressure trends reviewed - depression screen reviewed - home or ambulatory blood pressure monitoring encouraged  Follow Up Plan: Telephone follow up appointment with care management team member scheduled.     Care Plan : RNCM: Depression (Adult)  Updates made by [name], RN since 09/18/2022 12:00 AM     Problem: RNCM: Symptoms (Depression)   Priority: High  Onset Date: 12/02/2020     Long-Range Goal: RNCM: Symptoms Monitored and Managed   Start Date: 12/02/2020  Expected End Date: 05/21/2023  Recent Progress: Not on track  Priority: High  Note:   Current Barriers:  Ineffective Self Health Maintenance in a  patient with Anxiety and Depression Unable to independently manage depression and anxiety Does not attend all scheduled provider appointments Lacks social connections Unable to perform IADLs independently Does not contact provider office for questions/concerns 09/18/22:  LCSW continues to follow-patient has declined therapy services for now.  Clinical Goal(s):  Collaboration with [name], NP regarding development and update of comprehensive plan of care as evidenced by provider attestation and co-signature Inter-disciplinary care team collaboration (see longitudinal plan of care) patient will work with care management team to address care coordination and chronic disease management needs related to Disease Management Educational Needs Care Coordination Mental Health Counseling Level of Care Concerns    Interventions:  Evaluation of current treatment plan related to Anxiety and Depression, Financial constraints related to insurance not approving care for the patient to go to wound care , Limited social support, Level of  care concerns, ADL IADL limitations, Mental Health Concerns , Family and relationship dysfunction, Substance abuse issues -   , and Inability to perform IADL's independently self-management and patient's adherence to plan as established by provider. Inter-disciplinary care team collaboration (see longitudinal plan of care) Discussed plans with patient for ongoing care management follow up and provided patient with direct contact information for care management team Evaluation of depression and anxiety.  LCSW referral for increased stress-completed Collaborated with LCSW   Self Care Activities:  Patient verbalizes understanding of plan to effectively manage depression and anxiety Self administers medications as prescribed Attends all scheduled provider appointments Calls pharmacy for medication refills Attends church or other social activities Performs ADL's independently Performs IADL's independently Calls provider office for new concerns or questions Work with the [facility] team to meet needs for continued CCM services   Patient Goals: - activity or exercise based on tolerance encouraged - depression screen reviewed - emotional support provided - healthy lifestyle promoted - medication side effects monitored and managed - pain managed - participation in mental health treatment encouraged - quality of sleep assessed - response to mental health treatment monitored - response to pharmacologic therapy monitored - sleep hygiene techniques encouraged - social activities and relationships encouraged - substance use assessed Follow Up Plan: The care management team will reach out to the patient again over the next 90 business days.

## 2022-09-18 NOTE — Patient Outreach (Signed)
Medicaid Managed Care   Nurse Care Manager Note  09/18/2022 Name:  Cheryl Chandler MRN:  [id number] DOB:  05-28-1965  Cheryl Chandler is an 57 y.o. year old female who is a primary patient of [name], NP.  The [facility] Managed Care Coordination team was consulted for assistance with:    Chronic healthcare management needs, LBP, OSA, GERD, arthritis, HLD, HTN, DM, COPD, anxiety/depression, lymphedema, headaches, hypothyroid  Cheryl Chandler was given information about Medicaid Managed Care Coordination team services today. [place] Patient agreed to services and verbal consent obtained.  Engaged with patient by telephone for follow up visit in response to provider referral for case management and/or care coordination services.   Assessments/Interventions:  Review of past medical history, allergies, medications, health status, including review of consultants reports, laboratory and other test data, was performed as part of comprehensive evaluation and provision of chronic care management services.  SDOH (Social Determinants of Health) assessments and interventions performed: [name]    Flowsheet Row Patient Outreach Telephone from 09/18/2022 in [place] POPULATION HEALTH DEPARTMENT Patient Outreach Telephone from 08/24/2022 in [place] POPULATION HEALTH DEPARTMENT Office Visit from 08/03/2022 in [place] [facility] Patient Outreach Telephone from 07/28/2022 in [place] POPULATION HEALTH DEPARTMENT Patient Outreach Telephone from 07/20/2022 in [place] POPULATION HEALTH DEPARTMENT Patient Outreach Telephone from 06/25/2022 in [place] POPULATION HEALTH DEPARTMENT  [name]        Food Insecurity Interventions Other (Comment)  [has food stamps and resources] -- -- -- -- --  Alcohol Usage Interventions -- -- -- -- Intervention Not Indicated (Score <7) --  Depression Interventions/Treatment  -- -- Medication, Currently on  Treatment -- -- --  Stress Interventions -- [employer], Provide Counseling  [Recent grief triggers] -- [employer], Provide Counseling -- Intervention Not Indicated  Social Connections Interventions Intervention Not Indicated -- -- -- -- --     Care Plan  Allergies  Allergen Reactions   Codeine Shortness Of Breath and Rash   Percocet [Oxycodone-Acetaminophen] Shortness Of Breath   Sulfa Antibiotics Shortness Of Breath and Rash   Aspirin Hives   Bee Venom     Bee stings   Darvon [Propoxyphene]     Darvocet-N 100   Latex    Oxycodone Nausea And Vomiting   Penicillins     Has patient had a PCN reaction causing immediate rash, facial/tongue/throat swelling, SOB or lightheadedness with hypotension: Unknown Has patient had a PCN reaction causing severe rash involving mucus membranes or skin necrosis: Unknown Has patient had a PCN reaction that required hospitalization: Unknown Has patient had a PCN reaction occurring within the last 10 years: Unknown If all of the above answers are "NO", then may proceed with Cephalosporin use.   Medications Reviewed Today     Reviewed by [name], RN (Registered Nurse) on 09/18/22 at [phone]  Med List Status: <None>   Medication Order Taking? Sig Documenting Provider Last Dose Status Informant  ACCU-CHEK GUIDE test strip [id number] No USE TO CHECK BLOOD SUGAR 3 TIMES DAILY AS DIRECTED [name], NP Taking Active   Accu-Chek Softclix Lancets lancets [id number] No CHECK BLOOD SUGAR TWICE DAILY [name], NP Taking Active   albuterol (PROVENTIL) (2.5 MG/3ML) 0.083% nebulizer solution [id number] No USE 1 VIAL VIA NEBULIZER EVERY 4 HOURS AS NEEDED [name], NP Taking Active   amLODipine (NORVASC) 5 MG tablet [id number] No TAKE 1 TABLET BY MOUTH ONCE DAILY [name], NP Taking  Active   atorvastatin (LIPITOR) 80 MG tablet [id number] No TAKE 1 TABLET BY MOUTH ONCE EVERY Charolotte Eke, NP Taking Active   Blood Glucose Monitoring Suppl (ACCU-CHEK GUIDE) w/Device KIT [id number] No Use to check blood sugar 3 times a day. Aura Dials T, NP Taking Active   Blood Pressure Monitoring (OMRON 3 SERIES BP MONITOR) DEVI [id number] No Use to check blood pressure as directed [name], NP Taking Active   citalopram (CELEXA) 40 MG tablet [id number] No TAKE 1 TABLET BY MOUTH ONCE DAILY [name], NP Taking Active   diphenhydrAMINE (BENADRYL) 25 mg capsule [id number] No Take 25 mg by mouth every 6 (six) hours as needed for itching. Taking once a day [provider] Taking Active Self  EPINEPHrine 0.3 mg/0.3 mL IJ SOAJ injection [id number] No INJECT 1 SYRINGE INTO OUTER THIGH ONCE AS NEEDED FOR SEVERE ALLERGIC REACTION. [name], NP Taking Active   Fluticasone-Umeclidin-Vilant (TRELEGY ELLIPTA) 100-62.5-25 MCG/ACT AEPB [id number] No Inhale 1 puff into the lungs daily. Salena Saner, MD Taking Active   furosemide (LASIX) 20 MG tablet [id number] No TAKE 1 TABLET BY MOUTH ONCE DAILY IN AFTERNOON AS NEEDED LEG EDEMA [name], NP Taking Active            Med Note Littie Deeds, CHERYL A   Fri Aug 07, 2022  9:18 AM) Taking every day  gabapentin (NEURONTIN) 300 MG capsule [id number] No TAKE 1 CAPSULE BY MOUTH 3 TIMES DAILY ASNEEDED (NEED OFFICE VISIT FOR FURTHER REFILLS) [name], NP Taking Active   hydrALAZINE (APRESOLINE) 50 MG tablet [id number] No Take 1 tablet (50 mg total) by mouth 3 (three) times daily. [name], NP Taking Active            Med Note Littie Deeds, CHERYL A   Wed Aug 12, 2022  1:51 PM)    hydrOXYzine (VISTARIL) 25 MG capsule [id number] No Take 1 capsule (25 mg total) by mouth every 8 (eight) hours as needed. [name], NP Taking Active   levothyroxine (SYNTHROID) 50 MCG tablet [id number] No TAKE 1 TABLET BY MOUTH ONCE DAILY ON AN EMPTY STOMACH. WAIT 30 MINUTES BEFORE TAKING OTHER MEDS. [name], NP  Taking Active   lisinopril (ZESTRIL) 40 MG tablet [id number] No TAKE 1 TABLET BY MOUTH ONCE DAILY [name], NP Taking Active   meloxicam (MOBIC) 15 MG tablet [id number] No TAKE 1 TABLET BY MOUTH ONCE DAILY AS NEEDED WITH FOOD OR MILK [name], NP Taking Active            Med Note Littie Deeds, CHERYL A   Fri Aug 07, 2022  9:13 AM) Taking daily  montelukast (SINGULAIR) 10 MG tablet [id number] No TAKE 1 TABLET BY MOUTH AT BEDTIME [name], NP Taking Active   nitroGLYCERIN (NITROSTAT) 0.4 MG SL tablet [id number] No DISSOLVE 1 TABLET UNDER TONGUE AT ONSET OF CHEST PAIN. REPEAT IN 5 MIN IF NOT RESOLVED, MAX 3 DOSES. 911 IF NEEDED. [name], NP Taking Active   omeprazole (PRILOSEC) 20 MG capsule [id number] No TAKE 1 CAPSULE BY MOUTH ONCE DAILY [name], NP Taking Active   OXYGEN [id number] No Inhale 3 L into the lungs daily. [provider] Taking Active   spironolactone (ALDACTONE) 25 MG tablet [id number] No Take 0.5 tablets (12.5 mg total) by mouth daily. [name], NP Taking Active   TRULICITY 0.75 MG/0.5ML Namon Cirri [id number] No INJECT 0.75MG  SUBQ ONCE A WEEK [name], NP Taking Active   Vitamin D, Ergocalciferol, (DRISDOL) 1.25 MG (50000 UNIT) CAPS  capsule [id number] No TAKE 1 CAPSULE BY MOUTH EVERY 7 DAYS [name], NP Taking Active            Patient Active Problem List   Diagnosis Date Noted   Moderate episode of recurrent major depressive disorder (HCC) 08/03/2022   Controlled type 2 diabetes mellitus with diabetic polyneuropathy, without long-term current use of insulin (HCC) 08/03/2022   Venous ulcer (HCC) 05/03/2022   Foot pain, right 09/01/2021   Fibrocystic disease of both breasts 05/07/2019   IFG (impaired fasting glucose) 05/03/2019   Chronic venous insufficiency 11/09/2018   Overactive bladder 09/30/2018   Peripheral neuropathy 09/30/2018   Apnea 01/02/2016   Lymphedema 11/30/2014   Dyspnea on exertion 11/30/2014    Anxiety 11/29/2014   Arthritis 11/29/2014   COPD (chronic obstructive pulmonary disease) (HCC) 11/29/2014   Clinical depression 11/29/2014   H/O eating disorder 11/29/2014   Esophageal reflux 11/29/2014   Acne inversa 11/29/2014   Essential hypertension 11/29/2014   HLD (hyperlipidemia) 11/29/2014   Adult hypothyroidism 11/29/2014   Insomnia 11/29/2014   Low back pain 11/29/2014   Morbid obesity (HCC) 11/29/2014   Posttraumatic stress disorder 11/29/2014   Vitamin D deficiency 11/29/2014   Nicotine dependence, cigarettes, uncomplicated 11/29/2014   Conditions to be addressed/monitored per PCP order:  Chronic healthcare management needs, LBP, OSA, GERD, arthritis, HLD, HTN, DM, COPD, anxiety/depression, lymphedema, headaches, hypothyroid  Care Plan : RNCM: COPD (Adult)  Updates made by [name], RN since 09/18/2022 12:00 AM     Problem: RNCM: Psychological Adjustment to Diagnosis (COPD)   Priority: Medium  Onset Date: 05/22/2020     Long-Range Goal: RNCM: Adjustment to Disease Achieved   Start Date: 05/22/2020  Expected End Date: 12/19/2022  Recent Progress: On track  Priority: High  Note:   Current Barriers:  Knowledge deficits related to basic understanding of COPD disease process Knowledge deficits related to basic COPD self care/management Knowledge deficit related to basic understanding of how to use inhalers and how inhaled medications work Knowledge deficit related to importance of energy conservation Limited Social Support Unable to independently manage COPD Lacks social connections Does not contact provider office for questions/concerns  09/18/22:  patient continues on 3L of Oxygen-uses day and night as needed.  Recent PULM appt 4/15  Case Manager Clinical Goal(s): patient will report using inhalers as prescribed including rinsing mouth after use patient will be able to verbalize understanding of COPD action plan and when to seek appropriate levels of  medical care  patient will engage in lite exercise as tolerated to build/regain stamina and strength and reduce shortness of breath through activity tolerance  patient will verbalize basic understanding of COPD disease process and self care activities   Interventions:  Inter-disciplinary care team collaboration (see longitudinal plan of care) UNABLE to independently: manage COPD Provided patient with basic written and verbal COPD education on self care/management/and exacerbation prevention Provided patient with COPD action plan and reinforced importance of daily self assessment.  Provided written and verbal instructions on pursed lip breathing and utilized returned demonstration as teach back Provided instruction about proper use of medications used for management of COPD including inhalers Advised patient to self assesses COPD action plan zone and make appointment with provider if in the yellow zone for 48 hours without improvement. Provided patient with education about the role of exercise in the management of COPD Advised patient to engage in light exercise as tolerated 3-5 days a week Provided education about and advised patient to utilize infection  prevention strategies to reduce risk of respiratory infection.  Patient Goals/Self-Care Activities:  - decision-making supported - depression screen reviewed - emotional support provided - family involvement promoted - problem-solving facilitated - relaxation techniques promoted - verbalization of feelings encouraged  Follow Up Plan: Telephone follow up appointment with care management team member scheduled.   Care Plan : RNCM: Hypertension (Adult)  Updates made by [name], RN since 09/18/2022 12:00 AM     Problem: RNCM: Hypertension (Hypertension)   Priority: Medium  Onset Date: 05/22/2020     Long-Range Goal: RNCM: Hypertension Monitored   Start Date: 05/22/2020  Expected End Date: 12/19/2022  Recent Progress: On track   Priority: Medium  Note:    Current Barriers:  Knowledge Deficits related to basic understanding of hypertension pathophysiology and self care management Knowledge Deficits related to understanding of medications prescribed for management of hypertension Limited Social Support Unable to independently manage HTN Lacks social connections Does not contact provider office for questions/concern 09/18/22:  BP 150/51 at PCP appt 4/8-[facility] referral placed as well as Pharmacy referral.  Has CARDS appt 6/4.  Patient checks BP daily-hard to discern what exact readings are-?185/85.  Case Manager Clinical Goal(s):   patient will verbalize understanding of plan for hypertension management patient will demonstrate improved adherence to prescribed treatment plan for hypertension as evidenced by taking all medications as prescribed, monitoring and recording blood pressure as directed, adhering to low sodium/DASH diet patient will demonstrate improved health management independence as evidenced by checking blood pressure as directed and notifying PCP if SBP>160 or DBP > 90, taking all medications as prescribe, and adhering to a low sodium diet as discussed.  Interventions:  Inter-disciplinary care team collaboration (see longitudinal plan of care) Evaluation of current treatment plan related to hypertension self management and patient's adherence to plan as established by provider.  Provided education to patient re: stroke prevention, s/s of heart attack and stroke, DASH diet, complications of uncontrolled blood pressure Reviewed medications with patient and discussed importance of compliance.  Discussed plans with patient for ongoing care management follow up and provided patient with direct contact information for care management team Advised patient, providing education and rationale, to monitor blood pressure daily and record, calling PCP for findings outside established parameters.    Patient Goals/Self-Care Activities Over the next 120 days, patient will:  - Self administers medications as prescribed Attends all scheduled provider appointments Calls provider office for new concerns, questions, or BP outside discussed parameters Checks BP and records as discussed Follows a low sodium diet/DASH diet - blood pressure trends reviewed - depression screen reviewed - home or ambulatory blood pressure monitoring encouraged  Follow Up Plan: Telephone follow up appointment with care management team member scheduled.     Care Plan : RNCM: Depression (Adult)  Updates made by [name], RN since 09/18/2022 12:00 AM     Problem: RNCM: Symptoms (Depression)   Priority: High  Onset Date: 12/02/2020     Long-Range Goal: RNCM: Symptoms Monitored and Managed   Start Date: 12/02/2020  Expected End Date: 05/21/2023  Recent Progress: Not on track  Priority: High  Note:   Current Barriers:  Ineffective Self Health Maintenance in a patient with Anxiety and Depression Unable to independently manage depression and anxiety Does not attend all scheduled provider appointments Lacks social connections Unable to perform IADLs independently Does not contact provider office for questions/concerns 09/18/22:  LCSW continues to follow-patient has declined therapy services for now.  Clinical Goal(s):  Collaboration with [name], NP regarding development and update of comprehensive plan of care as evidenced by provider attestation and co-signature Inter-disciplinary care team collaboration (see longitudinal plan of care) patient will work with care management team to address care coordination and chronic disease management needs related to Disease Management Educational Needs Care Coordination Mental Health Counseling Level of Care Concerns    Interventions:  Evaluation of current treatment plan related to Anxiety and Depression, Financial constraints related to insurance  not approving care for the patient to go to wound care , Limited social support, Level of care concerns, ADL IADL limitations, Mental Health Concerns , Family and relationship dysfunction, Substance abuse issues -   , and Inability to perform IADL's independently self-management and patient's adherence to plan as established by provider. Inter-disciplinary care team collaboration (see longitudinal plan of care) Discussed plans with patient for ongoing care management follow up and provided patient with direct contact information for care management team Evaluation of depression and anxiety.  LCSW referral for increased stress-completed Collaborated with LCSW   Self Care Activities:  Patient verbalizes understanding of plan to effectively manage depression and anxiety Self administers medications as prescribed Attends all scheduled provider appointments Calls pharmacy for medication refills Attends church or other social activities Performs ADL's independently Performs IADL's independently Calls provider office for new concerns or questions Work with the [facility] team to meet needs for continued CCM services   Patient Goals: - activity or exercise based on tolerance encouraged - depression screen reviewed - emotional support provided - healthy lifestyle promoted - medication side effects monitored and managed - pain managed - participation in mental health treatment encouraged - quality of sleep assessed - response to mental health treatment monitored - response to pharmacologic therapy monitored - sleep hygiene techniques encouraged - social activities and relationships encouraged - substance use assessed Follow Up Plan: The care management team will reach out to the patient again over the next 90 business days.   Follow Up:  Patient agrees to Care Plan and Follow-up.  Plan: The Managed Medicaid care management team will reach out to the patient again over the next 90 business  days.  and The  Patient has been provided with contact information for the Managed Medicaid care management team and has been advised to call with any health related questions or concerns.  Date/time of next scheduled RN care management/care coordination outreach:  12/02/22 at 0900

## 2022-09-23 ENCOUNTER — Other Ambulatory Visit: Payer: Medicaid Other | Admitting: Licensed Clinical Social Worker

## 2022-09-23 NOTE — Patient Outreach (Signed)
Medicaid Managed Care Social Work Note  09/23/2022 Name:  Cheryl Chandler MRN:  [id number] DOB:  04-16-1966  Cheryl Chandler is an 57 y.o. year old female who is a primary patient of Cheryl Grooms, NP.  The [facility] Managed Care Coordination team was consulted for assistance with:  {MM SW CONSULT REASONS:25043}  [name] was given information about Medicaid Managed Care Coordination team services today. Cheryl Chandler {MMAMBCONSENTCHOICES:26158}  Engaged with patient  for {MMTELEPHONEFACETOFACE:25045} for{MMINITIALFOLLOWUPCHOICE:25046} in response to referral for case management and/or care coordination services.   Assessments/Interventions:  Review of past medical history, allergies, medications, health status, including review of consultants reports, laboratory and other test data, was performed as part of comprehensive evaluation and provision of chronic care management services.  SDOH: (Social Determinant of Health) assessments and interventions performed: [name]    Flowsheet Row Patient Outreach Telephone from 09/18/2022 in [place] POPULATION HEALTH DEPARTMENT Patient Outreach Telephone from 08/24/2022 in [place] POPULATION HEALTH DEPARTMENT Office Visit from 08/03/2022 in [facility] Patient Outreach Telephone from 07/28/2022 in [place] POPULATION HEALTH DEPARTMENT Patient Outreach Telephone from 07/20/2022 in [place] POPULATION HEALTH DEPARTMENT Patient Outreach Telephone from 06/25/2022 in [place] POPULATION HEALTH DEPARTMENT  [name]        Food Insecurity Interventions Other (Comment)  [has food stamps and resources] -- -- -- -- --  Alcohol Usage Interventions -- -- -- -- Intervention Not Indicated (Score <7) --  Depression Interventions/Treatment  -- -- Medication, Currently on Treatment -- -- --  Stress Interventions -- [employer], Provide Counseling  [Recent grief  triggers] -- [employer], Provide Counseling -- Intervention Not Indicated  Social Connections Interventions Intervention Not Indicated -- -- -- -- --       Advanced Directives Status:  {Advanced Directives Status:25048}  Care Plan                 Allergies  Allergen Reactions   Codeine Shortness Of Breath and Rash   Percocet [Oxycodone-Acetaminophen] Shortness Of Breath   Sulfa Antibiotics Shortness Of Breath and Rash   Aspirin Hives   Bee Venom     Bee stings   Darvon [Propoxyphene]     Darvocet-N 100   Latex    Oxycodone Nausea And Vomiting   Penicillins     Has patient had a PCN reaction causing immediate rash, facial/tongue/throat swelling, SOB or lightheadedness with hypotension: Unknown Has patient had a PCN reaction causing severe rash involving mucus membranes or skin necrosis: Unknown Has patient had a PCN reaction that required hospitalization: Unknown Has patient had a PCN reaction occurring within the last 10 years: Unknown If all of the above answers are "NO", then may proceed with Cephalosporin use.    Medications Reviewed Today     Reviewed by Cheryl Chandler (Registered Nurse) on 09/18/22 at [phone]  Med List Status: <None>   Medication Order Taking? Sig Documenting Provider Last Dose Status Informant  ACCU-CHEK GUIDE test strip [id number] No USE TO CHECK BLOOD SUGAR 3 TIMES DAILY AS DIRECTED Cheryl Grooms, NP Taking Active   Accu-Chek Softclix Lancets lancets [id number] No CHECK BLOOD SUGAR TWICE DAILY Cheryl Grooms, NP Taking Active   albuterol (PROVENTIL) (2.5 MG/3ML) 0.083% nebulizer solution [id number] No USE 1 VIAL VIA NEBULIZER EVERY 4 HOURS AS NEEDED Cheryl Grooms, NP Taking Active   amLODipine (NORVASC) 5 MG tablet [id number] No TAKE 1 TABLET BY MOUTH ONCE DAILY Cheryl Grooms, NP Taking Active  atorvastatin (LIPITOR) 80 MG tablet [id number] No TAKE 1 TABLET BY MOUTH ONCE EVERY Cheryl Eke, NP Taking  Active   Blood Glucose Monitoring Suppl (ACCU-CHEK GUIDE) w/Device KIT [id number] No Use to check blood sugar 3 times a day. Aura Dials T, NP Taking Active   Blood Pressure Monitoring (OMRON 3 SERIES BP MONITOR) DEVI [id number] No Use to check blood pressure as directed Cheryl Grooms, NP Taking Active   citalopram (CELEXA) 40 MG tablet [id number] No TAKE 1 TABLET BY MOUTH ONCE DAILY Cheryl Grooms, NP Taking Active   diphenhydrAMINE (BENADRYL) 25 mg capsule [id number] No Take 25 mg by mouth every 6 (six) hours as needed for itching. Taking once a day [provider] Taking Active Self  EPINEPHrine 0.3 mg/0.3 mL IJ SOAJ injection [id number] No INJECT 1 SYRINGE INTO OUTER THIGH ONCE AS NEEDED FOR SEVERE ALLERGIC REACTION. Cheryl Grooms, NP Taking Active   Fluticasone-Umeclidin-Vilant (TRELEGY ELLIPTA) 100-62.5-25 MCG/ACT AEPB [id number] No Inhale 1 puff into the lungs daily. Salena Saner, MD Taking Active   furosemide (LASIX) 20 MG tablet [id number] No TAKE 1 TABLET BY MOUTH ONCE DAILY IN AFTERNOON AS NEEDED LEG EDEMA Cheryl Grooms, NP Taking Active            Med Note Littie Deeds, CHERYL A   Fri Aug 07, 2022  9:18 AM) Taking every day  gabapentin (NEURONTIN) 300 MG capsule [id number] No TAKE 1 CAPSULE BY MOUTH 3 TIMES DAILY ASNEEDED (NEED OFFICE VISIT FOR FURTHER REFILLS) Cheryl Grooms, NP Taking Active   hydrALAZINE (APRESOLINE) 50 MG tablet [id number] No Take 1 tablet (50 mg total) by mouth 3 (three) times daily. Cheryl Grooms, NP Taking Active            Med Note Littie Deeds, CHERYL A   Wed Aug 12, 2022  1:51 PM)    hydrOXYzine (VISTARIL) 25 MG capsule [id number] No Take 1 capsule (25 mg total) by mouth every 8 (eight) hours as needed. Cheryl Grooms, NP Taking Active   levothyroxine (SYNTHROID) 50 MCG tablet [id number] No TAKE 1 TABLET BY MOUTH ONCE DAILY ON AN EMPTY STOMACH. WAIT 30 MINUTES BEFORE TAKING OTHER MEDS. Cheryl Grooms, NP Taking Active   lisinopril  (ZESTRIL) 40 MG tablet [id number] No TAKE 1 TABLET BY MOUTH ONCE DAILY Cheryl Grooms, NP Taking Active   meloxicam (MOBIC) 15 MG tablet [id number] No TAKE 1 TABLET BY MOUTH ONCE DAILY AS NEEDED WITH FOOD OR MILK Cheryl Grooms, NP Taking Active            Med Note Littie Deeds, CHERYL A   Fri Aug 07, 2022  9:13 AM) Taking daily  montelukast (SINGULAIR) 10 MG tablet [id number] No TAKE 1 TABLET BY MOUTH AT BEDTIME Cheryl Grooms, NP Taking Active   nitroGLYCERIN (NITROSTAT) 0.4 MG SL tablet [id number] No DISSOLVE 1 TABLET UNDER TONGUE AT ONSET OF CHEST PAIN. REPEAT IN 5 MIN IF NOT RESOLVED, MAX 3 DOSES. 911 IF NEEDED. Cheryl Grooms, NP Taking Active   omeprazole (PRILOSEC) 20 MG capsule [id number] No TAKE 1 CAPSULE BY MOUTH ONCE DAILY Cheryl Grooms, NP Taking Active   OXYGEN [id number] No Inhale 3 L into the lungs daily. [provider] Taking Active   spironolactone (ALDACTONE) 25 MG tablet [id number] No Take 0.5 tablets (12.5 mg total) by mouth daily. Cheryl Grooms, NP Taking Active   TRULICITY 0.75 MG/0.5ML Namon Cirri [id number] No INJECT 0.75MG  SUBQ ONCE A Saintclair Halsted, NP Taking Active   Vitamin D, Ergocalciferol, (DRISDOL) 1.25 MG (50000 UNIT) CAPS capsule [id number] No  TAKE 1 CAPSULE BY MOUTH EVERY 7 DAYS Cheryl Grooms, NP Taking Active             Patient Active Problem List   Diagnosis Date Noted   Moderate episode of recurrent major depressive disorder (HCC) 08/03/2022   Controlled type 2 diabetes mellitus with diabetic polyneuropathy, without long-term current use of insulin (HCC) 08/03/2022   Venous ulcer (HCC) 05/03/2022   Foot pain, right 09/01/2021   Fibrocystic disease of both breasts 05/07/2019   IFG (impaired fasting glucose) 05/03/2019   Chronic venous insufficiency 11/09/2018   Overactive bladder 09/30/2018   Peripheral neuropathy 09/30/2018   Apnea 01/02/2016   Lymphedema 11/30/2014   Dyspnea on exertion 11/30/2014   Anxiety 11/29/2014    Arthritis 11/29/2014   COPD (chronic obstructive pulmonary disease) (HCC) 11/29/2014   Clinical depression 11/29/2014   H/O eating disorder 11/29/2014   Esophageal reflux 11/29/2014   Acne inversa 11/29/2014   Essential hypertension 11/29/2014   HLD (hyperlipidemia) 11/29/2014   Adult hypothyroidism 11/29/2014   Insomnia 11/29/2014   Low back pain 11/29/2014   Morbid obesity (HCC) 11/29/2014   Posttraumatic stress disorder 11/29/2014   Vitamin D deficiency 11/29/2014   Nicotine dependence, cigarettes, uncomplicated 11/29/2014    Conditions to be addressed/monitored per PCP order:  {CCM ASSESSMENT DZ OPTIONS:25047}  There are no care plans that you recently modified to display for this patient.   Follow up:  {FOLLOWUP:24991}  Plan: {MANAGED MEDICAID FOLLOW UP ZOXW:96045}  Date/time of next scheduled Social Work care management/care coordination outreach:  ***  ***

## 2022-09-24 DIAGNOSIS — J449 Chronic obstructive pulmonary disease, unspecified: Secondary | ICD-10-CM | POA: Diagnosis not present

## 2022-09-24 DIAGNOSIS — G4736 Sleep related hypoventilation in conditions classified elsewhere: Secondary | ICD-10-CM | POA: Diagnosis not present

## 2022-09-24 NOTE — Patient Instructions (Signed)
Visit Information  Ms. Rickey was given information about Medicaid Managed Care team care coordination services as a part of their [facility] Medicaid benefit. Latoyia Gavrielle Neves verbally consented to engagement with the [facility] Managed Care team.   If you are experiencing a medical emergency, please call 911 or report to your local emergency department or urgent care.   If you have a non-emergency medical problem during routine business hours, please contact your provider's office and ask to speak with a nurse.   For questions related to your [facility] health plan, please call: [phone] or go here:[email or web address]  If you would like to schedule transportation through your [facility] plan, please call the following number at least 2 days in advance of your appointment: [phone].   You can also use the MTM portal or MTM mobile app to manage your rides. Reimbursement for transportation is available through [facility]! For the portal, please go to [email or web address]  Call the [facility] Crisis Line at [phone], at any time, 24 hours a day, 7 days a week. If you are in danger or need immediate medical attention call 911.  If you would like help to quit smoking, call 1-800-QUIT-NOW (9417688029) OR Espaol: 1-855-Djelo-Ya (4-132-440-1027) o para ms informacin haga clic aqu or Text READY to 253-664 to register via text  [name], BSW, MSW, [employer] Managed Medicaid LCSW [facility]  [facility] [email or web address] Phone: [phone]

## 2022-09-26 DIAGNOSIS — Z419 Encounter for procedure for purposes other than remedying health state, unspecified: Secondary | ICD-10-CM | POA: Diagnosis not present

## 2022-09-29 ENCOUNTER — Ambulatory Visit: Payer: Medicaid Other | Admitting: Cardiovascular Disease

## 2022-09-29 DIAGNOSIS — J4489 Other specified chronic obstructive pulmonary disease: Secondary | ICD-10-CM | POA: Diagnosis not present

## 2022-09-29 DIAGNOSIS — E1142 Type 2 diabetes mellitus with diabetic polyneuropathy: Secondary | ICD-10-CM | POA: Diagnosis not present

## 2022-09-29 DIAGNOSIS — Z6841 Body Mass Index (BMI) 40.0 and over, adult: Secondary | ICD-10-CM | POA: Diagnosis not present

## 2022-09-29 DIAGNOSIS — E785 Hyperlipidemia, unspecified: Secondary | ICD-10-CM | POA: Diagnosis not present

## 2022-09-29 DIAGNOSIS — K219 Gastro-esophageal reflux disease without esophagitis: Secondary | ICD-10-CM | POA: Diagnosis not present

## 2022-09-29 DIAGNOSIS — F329 Major depressive disorder, single episode, unspecified: Secondary | ICD-10-CM | POA: Diagnosis not present

## 2022-09-29 DIAGNOSIS — I209 Angina pectoris, unspecified: Secondary | ICD-10-CM | POA: Diagnosis not present

## 2022-09-29 DIAGNOSIS — E11620 Type 2 diabetes mellitus with diabetic dermatitis: Secondary | ICD-10-CM | POA: Diagnosis not present

## 2022-09-29 DIAGNOSIS — I10 Essential (primary) hypertension: Secondary | ICD-10-CM | POA: Diagnosis not present

## 2022-09-29 DIAGNOSIS — R32 Unspecified urinary incontinence: Secondary | ICD-10-CM | POA: Diagnosis not present

## 2022-10-02 ENCOUNTER — Other Ambulatory Visit: Payer: Self-pay | Admitting: Nurse Practitioner

## 2022-10-02 NOTE — Telephone Encounter (Signed)
Requested Prescriptions  Pending Prescriptions Disp Refills   lisinopril (ZESTRIL) 40 MG tablet [Pharmacy Med Name: LISINOPRIL 40 MG TAB] 90 tablet 0    Sig: TAKE 1 TABLET BY MOUTH ONCE DAILY     Cardiovascular:  ACE Inhibitors Passed - 10/02/2022  1:29 PM      Passed - Cr in normal range and within 180 days    Creatinine  Date Value Ref Range Status  08/05/2014 0.86 mg/dL Final    Comment:    1.61-0.96 NOTE: New Reference Range  07/03/14    Creatinine, Ser  Date Value Ref Range Status  08/03/2022 0.70 0.57 - 1.00 mg/dL Final         Passed - K in normal range and within 180 days    Potassium  Date Value Ref Range Status  08/03/2022 4.4 3.5 - 5.2 mmol/L Final  08/05/2014 4.5 mmol/L Final    Comment:    3.5-5.1 NOTE: New Reference Range  07/03/14          Passed - Patient is not pregnant      Passed - Last BP in normal range    BP Readings from Last 1 Encounters:  08/10/22 124/70         Passed - Valid encounter within last 6 months    Recent Outpatient Visits           2 months ago Essential hypertension   [place] [facility] [name], NP   5 months ago Morbid obesity [facility])   [place] [facility] [name], NP   6 months ago Rash and nonspecific skin eruption   [place] [facility] Mecum, Oswaldo Conroy, PA-C   9 months ago Appointment canceled by hospital   [place] [facility] [name], NP   9 months ago Essential hypertension   [place] [facility] [name], NP       Future Appointments             In 1 month Mecum, Oswaldo Conroy, PA-C [facility]   In 2 months Gollan, Tollie Pizza, MD [place] [place]   In 5 months Willeen Niece, MD [facility]             furosemide (LASIX) 20 MG tablet [Pharmacy Med Name: FUROSEMIDE 20 MG TAB] 90 tablet 0    Sig: TAKE 1 TABLET BY MOUTH ONCE  DAILY IN AFTERNOON AS NEEDED LEG EDEMA     Cardiovascular:  Diuretics - Loop Failed - 10/02/2022  1:29 PM      Failed - Mg Level in normal range and within 180 days    No results found for: "MG"       Passed - K in normal range and within 180 days    Potassium  Date Value Ref Range Status  08/03/2022 4.4 3.5 - 5.2 mmol/L Final  08/05/2014 4.5 mmol/L Final    Comment:    3.5-5.1 NOTE: New Reference Range  07/03/14          Passed - Ca in normal range and within 180 days    Calcium  Date Value Ref Range Status  08/03/2022 8.7 8.7 - 10.2 mg/dL Final   Calcium, Total  Date Value Ref Range Status  08/05/2014 8.2 (L) mg/dL Final    Comment:    0.4-54.0 NOTE: New Reference Range  07/03/14  Passed - Na in normal range and within 180 days    Sodium  Date Value Ref Range Status  08/03/2022 140 134 - 144 mmol/L Final  08/05/2014 135 mmol/L Final    Comment:    135-145 NOTE: New Reference Range  07/03/14          Passed - Cr in normal range and within 180 days    Creatinine  Date Value Ref Range Status  08/05/2014 0.86 mg/dL Final    Comment:    1.61-0.96 NOTE: New Reference Range  07/03/14    Creatinine, Ser  Date Value Ref Range Status  08/03/2022 0.70 0.57 - 1.00 mg/dL Final         Passed - Cl in normal range and within 180 days    Chloride  Date Value Ref Range Status  08/03/2022 102 96 - 106 mmol/L Final  08/05/2014 102 mmol/L Final    Comment:    101-111 NOTE: New Reference Range  07/03/14          Passed - Last BP in normal range    BP Readings from Last 1 Encounters:  08/10/22 124/70         Passed - Valid encounter within last 6 months    Recent Outpatient Visits           2 months ago Essential hypertension   [place] [facility] [name], NP   5 months ago Morbid obesity [facility])   [place] [facility] [name], NP   6 months ago Rash and nonspecific skin eruption   [place]  [facility] Mecum, Oswaldo Conroy, PA-C   9 months ago Appointment canceled by hospital   [place] [facility] [name], NP   9 months ago Essential hypertension   [place] [facility] [name], NP       Future Appointments             In 1 month Mecum, Oswaldo Conroy, PA-C [facility], [facility]   In 2 months Gollan, Tollie Pizza, MD [facility] [place]   In 5 months Willeen Niece, MD [facility]             gabapentin (NEURONTIN) 300 MG capsule [Pharmacy Med Name: GABAPENTIN 300 MG CAP] 270 capsule 0    Sig: TAKE 1 CAPSULE BY MOUTH 3 TIMES DAILY ASNEEDED (NEED OFFICE VISIT FOR FURTHER REFILLS)     Neurology: Anticonvulsants - gabapentin Passed - 10/02/2022  1:29 PM      Passed - Cr in normal range and within 360 days    Creatinine  Date Value Ref Range Status  08/05/2014 0.86 mg/dL Final    Comment:    0.45-4.09 NOTE: New Reference Range  07/03/14    Creatinine, Ser  Date Value Ref Range Status  08/03/2022 0.70 0.57 - 1.00 mg/dL Final         Passed - Completed PHQ-2 or PHQ-9 in the last 360 days      Passed - Valid encounter within last 12 months    Recent Outpatient Visits           2 months ago Essential hypertension   [place] [facility] [name], NP   5 months ago Morbid obesity [facility])   [place] [facility] [name], NP   6 months ago Rash and nonspecific skin eruption   [place] [facility] Mecum, Oswaldo Conroy, PA-C   9 months ago Appointment canceled by hospital   [facility] [name], NP   9 months ago Essential hypertension   [place] [facility] [name], NP       Future Appointments             In 1 month Mecum, Oswaldo Conroy, PA-C [place] [facility], [facility]   In 2 months Gollan, Tollie Pizza, MD [facility]  [place]   In 5 months Willeen Niece, MD [facility]

## 2022-10-06 DIAGNOSIS — R3981 Functional urinary incontinence: Secondary | ICD-10-CM | POA: Diagnosis not present

## 2022-10-06 DIAGNOSIS — Z993 Dependence on wheelchair: Secondary | ICD-10-CM | POA: Diagnosis not present

## 2022-10-25 DIAGNOSIS — J449 Chronic obstructive pulmonary disease, unspecified: Secondary | ICD-10-CM | POA: Diagnosis not present

## 2022-10-25 DIAGNOSIS — G4736 Sleep related hypoventilation in conditions classified elsewhere: Secondary | ICD-10-CM | POA: Diagnosis not present

## 2022-10-26 DIAGNOSIS — Z419 Encounter for procedure for purposes other than remedying health state, unspecified: Secondary | ICD-10-CM | POA: Diagnosis not present

## 2022-10-30 DIAGNOSIS — I872 Venous insufficiency (chronic) (peripheral): Secondary | ICD-10-CM | POA: Diagnosis not present

## 2022-10-30 DIAGNOSIS — I10 Essential (primary) hypertension: Secondary | ICD-10-CM | POA: Diagnosis not present

## 2022-10-30 DIAGNOSIS — S81809A Unspecified open wound, unspecified lower leg, initial encounter: Secondary | ICD-10-CM | POA: Diagnosis not present

## 2022-11-02 ENCOUNTER — Telehealth (INDEPENDENT_AMBULATORY_CARE_PROVIDER_SITE_OTHER): Payer: Self-pay | Admitting: Nurse Practitioner

## 2022-11-02 ENCOUNTER — Encounter (INDEPENDENT_AMBULATORY_CARE_PROVIDER_SITE_OTHER): Payer: Self-pay | Admitting: Nurse Practitioner

## 2022-11-02 ENCOUNTER — Ambulatory Visit (INDEPENDENT_AMBULATORY_CARE_PROVIDER_SITE_OTHER): Payer: Medicaid Other | Admitting: Nurse Practitioner

## 2022-11-02 ENCOUNTER — Ambulatory Visit (INDEPENDENT_AMBULATORY_CARE_PROVIDER_SITE_OTHER): Payer: Medicaid Other | Admitting: Vascular Surgery

## 2022-11-02 ENCOUNTER — Ambulatory Visit: Payer: Medicaid Other | Admitting: Physician Assistant

## 2022-11-02 VITALS — BP 121/65 | HR 76 | Resp 16

## 2022-11-02 DIAGNOSIS — I83009 Varicose veins of unspecified lower extremity with ulcer of unspecified site: Secondary | ICD-10-CM | POA: Diagnosis not present

## 2022-11-02 DIAGNOSIS — L97909 Non-pressure chronic ulcer of unspecified part of unspecified lower leg with unspecified severity: Secondary | ICD-10-CM

## 2022-11-02 NOTE — Progress Notes (Signed)
History of Present Illness  There is no documented history at this time  Assessments & Plan   There are no diagnoses linked to this encounter.    Additional instructions  Subjective:  Patient presents with venous ulcer of the Bilateral lower extremity.    Procedure:  3 layer unna wrap was placed Bilateral lower extremity.   Plan:   Follow up in one week.  

## 2022-11-02 NOTE — Telephone Encounter (Signed)
Patient called in stating that she went to walk in clinic over the weekend for her legs weeping and draining and was told to follow up with our office. Wanting to know if she can come in today to get her legs looked at and wrapped      Please call and advise

## 2022-11-02 NOTE — Telephone Encounter (Signed)
She can come in for unna boots

## 2022-11-03 ENCOUNTER — Other Ambulatory Visit: Payer: Medicaid Other | Admitting: Licensed Clinical Social Worker

## 2022-11-03 NOTE — Patient Instructions (Signed)
Visit Information  Ms. Pion was given information about Medicaid Managed Care team care coordination services as a part of their [facility] Medicaid benefit. Camiah Eldora Napp verbally consented to engagement with the [facility] Managed Care team.   If you are experiencing a medical emergency, please call 911 or report to your local emergency department or urgent care.   If you have a non-emergency medical problem during routine business hours, please contact your provider's office and ask to speak with a nurse.   For questions related to your [facility] health plan, please call: [phone] or go here:[email or web address]  If you would like to schedule transportation through your [facility] plan, please call the following number at least 2 days in advance of your appointment: [phone].   You can also use the MTM portal or MTM mobile app to manage your rides. Reimbursement for transportation is available through [facility]! For the portal, please go to [email or web address]  Call the [facility] Crisis Line at [phone], at any time, 24 hours a day, 7 days a week. If you are in danger or need immediate medical attention call 911.  If you would like help to quit smoking, call 1-800-QUIT-NOW ((332)650-2913) OR Espaol: 1-855-Djelo-Ya (4-132-440-1027) o para ms informacin haga clic aqu or Text READY to 253-664 to register via text   Following is a copy of your plan of care:  Care Plan : General Social Work (Adult)  Updates made by Gustavus Bryant, LCSW since 11/03/2022 12:00 AM     Problem: Coping Skills (General Plan of Care)      Long-Range Goal: Coping Skills Enhanced   Start Date: 11/07/2020  Expected End Date: 03/07/2022  Recent Progress: On track  Priority: High  Note:   Timeframe:  Long-Range Goal Priority:  High Start Date:       08/26/21                 Expected End Date:  ongoing  Follow up date-  01/21/23 at 11:15 am   Current Barriers:   Financial constraints Mental Health Concerns  Social Isolation Limited education about mental health support resources that are available to her within the local area* Cognitive Deficits Lacks knowledge of community resource   Clinical Social Work Clinical Goal(s):  Over the next 90 days, client will work with SW to address concerns related to experiencing ongoing symptoms of depression, sadness and grief since the passing of her mother Over the next 90 days, patient will work with LCSW to address needs related to implementing appropriate self-care and depression management.    Managing Loss, Adult People experience loss in many different ways throughout their lives. Events such as moving, changing jobs, and losing friends can create a sense of loss. The loss may be as serious as a major health change, divorce, death of a pet, or death of a loved one. All of these types of loss are likely to create a physical and emotional reaction known as grief. Grief is the result of a major change or an absence of something or someone that you count on. Grief is a normal reaction to loss. A variety of factors can affect your grieving experience, including: The nature of your loss. Your relationship to what or whom you lost. Your understanding of grief and how to manage it. Your support system. How to manage lifestyle changes Keep to your normal routine as much as possible. If you have trouble focusing or doing normal activities, it is acceptable to take some  time away from your normal routine. Spend time with friends and loved ones. Eat a healthy diet, get plenty of sleep, and rest when you feel tired. How to recognize changes  The way that you deal with your grief will affect your ability to function as you normally do. When grieving, you may experience these changes: Numbness, shock, sadness, anxiety, anger, denial, and guilt. Thoughts about death. Unexpected crying. A physical sensation of  emptiness in your stomach. Problems sleeping and eating. Tiredness (fatigue). Loss of interest in normal activities. Dreaming about or imagining seeing the person who died. A need to remember what or whom you lost. Difficulty thinking about anything other than your loss for a period of time. Relief. If you have been expecting the loss for a while, you may feel a sense of relief when it happens. Follow these instructions at home:    Activity Express your feelings in healthy ways, such as: Talking with others about your loss. It may be helpful to find others who have had a similar loss, such as a support group. Writing down your feelings in a journal. Doing physical activities to release stress and emotional energy. Doing creative activities like painting, sculpting, or playing or listening to music. Practicing resilience. This is the ability to recover and adjust after facing challenges. Reading some resources that encourage resilience may help you to learn ways to practice those behaviors. General instructions Be patient with yourself and others. Allow the grieving process to happen, and remember that grieving takes time. It is likely that you may never feel completely done with some grief. You may find a way to move on while still cherishing memories and feelings about your loss. Accepting your loss is a process. It can take months or longer to adjust. Keep all follow-up visits as told by your health care provider. This is important. Where to find support To get support for managing loss: Ask your health care provider for help and recommendations, such as grief counseling or therapy. Think about joining a support group for people who are managing a loss. Where to find more information You can find more information about managing loss from: American Society of Clinical Oncology: www.cancer.net American Psychological Association: DiceTournament.ca Contact a health care provider if: Your  grief is extreme and keeps getting worse. You have ongoing grief that does not improve. Your body shows symptoms of grief, such as illness. You feel depressed, anxious, or lonely. Get help right away if: You have thoughts about hurting yourself or others. If you ever feel like you may hurt yourself or others, or have thoughts about taking your own life, get help right away. You can go to your nearest emergency department or call: Your local emergency services (911 in the [place] A suicide crisis helpline, such as the National Suicide Prevention Lifeline at 234-632-9244. This is open 24 hours a day. Summary Grief is the result of a major change or an absence of someone or something that you count on. Grief is a normal reaction to loss. The depth of grief and the period of recovery depend on the type of loss and your ability to adjust to the change and process your feelings. Processing grief requires patience and a willingness to accept your feelings and talk about your loss with people who are supportive. It is important to find resources that work for you and to realize that people experience grief differently. There is not one grieving process that works for everyone in the same way.  Be aware that when grief becomes extreme, it can lead to more severe issues like isolation, depression, anxiety, or suicidal thoughts. Talk with your health care provider if you have any of these issues. This information is not intended to replace advice given to you by your health care provider. Make sure you discuss any questions you have with your health care provider. Document Revised: 06/17/2018 Document Reviewed: 08/27/2016 Elsevier Patient Education  2020 [employer].  Help for Managing Grief   When you are experiencing grief, many resources and support are available to help you. You are not alone.    Grief Share ([email or web address]):    [employer] of [address] [place], [place]      [employer]  [phone] [address] [place], [place]     [facility] [facility] [address] [place], [place]      [facility] [address] [place], [place]  [phone]   [facility] [address] [place], [place] [phone]   [facility] [address] [place], [place]     [phone]   [facility] [address] [place], [place]     [phone]   If a Hospice or Palliative Care team has been involved in the care of your loved one, you may reach out to your contact there or locally, you may reach out to [facility]:    [facility]   [address] [place], [place] [address] [phone] Patient was educated on healthy self-care as she admits she has little motivation to walk lately. PCS recommendation was suggested.   Patient Self Care Activities:  Attends all scheduled provider appointments Calls provider office for new concerns or questions  Please see past updates related to this goal by clicking on the "Past Updates" button in the selected goal      24- Hour Availability:    [facility]  [address] [place], [place] [phone] Crisis [phone]   [facility] [phone]  [facility]  [phone]    [facility]  [phone] (after hours)   Therapeutic Alternative/Mobile Crisis   [phone]   [place] National Suicide Hotline  (575) 354-1576 Len Child[place]) [place] [address]   Call [phone]) [phone] for mental health emergencies   [facility]  [phone]);  [place] and [employer]  [phone]); [place], [place], [place], [place], [place], [place], [place]    [facility] for [facility] Residents For 24/7 walk-up access to mental health services for [facility] children (4+), adolescents and adults, please visit the [facility] located at [address] in [place], [place].  *[facility] also provides comprehensive outpatient behavioral health services in a variety of locations around the [place].  Connect With [place] [address] [place], [place] [address] HelpLine: [phone] or 1-([phone]) [phone]  Get Directions  Find Help 24/7 By Phone Call our 24-hour HelpLine at 707-094-9433 or 470-509-3731 for immediate assistance for mental health and substance abuse issues.  Walk-In Help [place]: [facility] (Ages 4 and Up) [place]: Emergency Dept., [facility] Additional Resources National Hopeline Network: 1-800-SUICIDE The National Suicide Prevention Lifeline: 1-800-273-TALK     [name], BSW, MSW, LCSW Managed Medicaid LCSW [facility]  [facility] [email or web address] Phone: [phone]

## 2022-11-03 NOTE — Patient Outreach (Signed)
Medicaid Managed Care Social Work Note  11/03/2022 Name:  Cheryl Chandler MRN:  [id number] DOB:  May 20, 1965  Cheryl Chandler is an 57 y.o. year old female who is a primary patient of [name], NP.  The Medicaid Managed Care Coordination team was consulted for assistance with:  Mental Health Counseling and Resources  [name] was given information about Medicaid Managed Care Coordination team services today. Cheryl Chandler Patient agreed to services and verbal consent obtained.  Engaged with patient  for by telephone forfollow up visit in response to referral for case management and/or care coordination services.   Assessments/Interventions:  Review of past medical history, allergies, medications, health status, including review of consultants reports, laboratory and other test data, was performed as part of comprehensive evaluation and provision of chronic care management services.  SDOH: (Social Determinant of Health) assessments and interventions performed: [name]    Flowsheet Row Patient Outreach Telephone from 11/03/2022 in [place] POPULATION HEALTH DEPARTMENT Patient Outreach Telephone from 09/18/2022 in [place] POPULATION HEALTH DEPARTMENT Patient Outreach Telephone from 08/24/2022 in [place] POPULATION HEALTH DEPARTMENT Office Visit from 08/03/2022 in [facility] Patient Outreach Telephone from 07/28/2022 in [place] POPULATION HEALTH DEPARTMENT Patient Outreach Telephone from 07/20/2022 in [place] POPULATION HEALTH DEPARTMENT  [name]        Food Insecurity Interventions -- Other (Comment)  [has food stamps and resources] -- -- -- --  Alcohol Usage Interventions -- -- -- -- -- Intervention Not Indicated (Score <7)  Depression Interventions/Treatment  -- -- -- Medication, Currently on Treatment -- --  Stress Interventions Other (Comment), Provide Counseling, Offered [employer] Resources --  Offered [employer], Provide Counseling  [Recent grief triggers] -- [employer], Provide Counseling --  Social Connections Interventions -- Intervention Not Indicated -- -- -- --       Advanced Directives Status:  See Care Plan for related entries.  Care Plan                 Allergies  Allergen Reactions   Codeine Shortness Of Breath and Rash   Percocet [Oxycodone-Acetaminophen] Shortness Of Breath   Sulfa Antibiotics Shortness Of Breath and Rash   Aspirin Hives   Bee Venom     Bee stings   Darvon [Propoxyphene]     Darvocet-N 100   Latex    Oxycodone Nausea And Vomiting   Penicillins     Has patient had a PCN reaction causing immediate rash, facial/tongue/throat swelling, SOB or lightheadedness with hypotension: Unknown Has patient had a PCN reaction causing severe rash involving mucus membranes or skin necrosis: Unknown Has patient had a PCN reaction that required hospitalization: Unknown Has patient had a PCN reaction occurring within the last 10 years: Unknown If all of the above answers are "NO", then may proceed with Cephalosporin use.    Medications Reviewed Today     Reviewed by Georgiana Spinner, NP (Nurse Practitioner) on 11/02/22 at 2327  Med List Status: <None>   Medication Order Taking? Sig Documenting Provider Last Dose Status Informant  ACCU-CHEK GUIDE test strip [id number] Yes USE TO CHECK BLOOD SUGAR 3 TIMES DAILY AS DIRECTED [name], NP Taking Active   Accu-Chek Softclix Lancets lancets [id number] Yes CHECK BLOOD SUGAR TWICE DAILY [name], NP Taking Active   albuterol (PROVENTIL) (2.5 MG/3ML) 0.083% nebulizer solution [id number] Yes USE 1 VIAL VIA NEBULIZER EVERY 4 HOURS AS NEEDED [name], NP Taking Active  amLODipine (NORVASC) 5 MG tablet [id number] Yes TAKE 1 TABLET BY MOUTH ONCE DAILY [name], NP Taking Active   atorvastatin (LIPITOR) 80 MG tablet [id number] Yes TAKE 1 TABLET BY  MOUTH ONCE EVERY Charolotte Eke, NP Taking Active   Blood Glucose Monitoring Suppl (ACCU-CHEK GUIDE) w/Device KIT [id number] Yes Use to check blood sugar 3 times a day. Aura Dials T, NP Taking Active   Blood Pressure Monitoring (OMRON 3 SERIES BP MONITOR) DEVI [id number] Yes Use to check blood pressure as directed [name], NP Taking Active   citalopram (CELEXA) 40 MG tablet [id number] Yes TAKE 1 TABLET BY MOUTH ONCE DAILY [name], NP Taking Active   diphenhydrAMINE (BENADRYL) 25 mg capsule [id number] Yes Take 25 mg by mouth every 6 (six) hours as needed for itching. Taking once a day [provider] Taking Active Self  EPINEPHrine 0.3 mg/0.3 mL IJ SOAJ injection [id number] Yes INJECT 1 SYRINGE INTO OUTER THIGH ONCE AS NEEDED FOR SEVERE ALLERGIC REACTION. [name], NP Taking Active   Fluticasone-Umeclidin-Vilant (TRELEGY ELLIPTA) 100-62.5-25 MCG/ACT AEPB [id number] Yes Inhale 1 puff into the lungs daily. Salena Saner, MD Taking Active   furosemide (LASIX) 20 MG tablet [id number] Yes TAKE 1 TABLET BY MOUTH ONCE DAILY IN AFTERNOON AS NEEDED LEG EDEMA [name], NP Taking Active   gabapentin (NEURONTIN) 300 MG capsule [id number] Yes TAKE 1 CAPSULE BY MOUTH 3 TIMES DAILY ASNEEDED (NEED OFFICE VISIT FOR FURTHER REFILLS) [name], NP Taking Active   hydrALAZINE (APRESOLINE) 50 MG tablet [id number] Yes Take 1 tablet (50 mg total) by mouth 3 (three) times daily. [name], NP Taking Active            Med Note Littie Deeds, CHERYL A   Wed Aug 12, 2022  1:51 PM)    hydrOXYzine (VISTARIL) 25 MG capsule [id number] Yes Take 1 capsule (25 mg total) by mouth every 8 (eight) hours as needed. [name], NP Taking Active   levothyroxine (SYNTHROID) 50 MCG tablet [id number] Yes TAKE 1 TABLET BY MOUTH ONCE DAILY ON AN EMPTY STOMACH. WAIT 30 MINUTES BEFORE TAKING OTHER MEDS. [name], NP Taking Active   lisinopril (ZESTRIL) 40 MG tablet  [id number] Yes TAKE 1 TABLET BY MOUTH ONCE DAILY [name], NP Taking Active   meloxicam (MOBIC) 15 MG tablet [id number] Yes TAKE 1 TABLET BY MOUTH ONCE DAILY AS NEEDED WITH FOOD OR MILK [name], NP Taking Active            Med Note Littie Deeds, CHERYL A   Fri Aug 07, 2022  9:13 AM) Taking daily  montelukast (SINGULAIR) 10 MG tablet [id number] Yes TAKE 1 TABLET BY MOUTH AT BEDTIME [name], NP Taking Active   nitroGLYCERIN (NITROSTAT) 0.4 MG SL tablet [id number] Yes DISSOLVE 1 TABLET UNDER TONGUE AT ONSET OF CHEST PAIN. REPEAT IN 5 MIN IF NOT RESOLVED, MAX 3 DOSES. 911 IF NEEDED. [name], NP Taking Active   omeprazole (PRILOSEC) 20 MG capsule [id number] Yes TAKE 1 CAPSULE BY MOUTH ONCE DAILY [name], NP Taking Active   OXYGEN [id number] Yes Inhale 3 L into the lungs daily. [provider] Taking Active   spironolactone (ALDACTONE) 25 MG tablet [id number] Yes Take 0.5 tablets (12.5 mg total) by mouth daily. [name], NP Taking Active   TRULICITY 0.75 MG/0.5ML Namon Cirri [id number] Yes INJECT 0.75MG  SUBQ ONCE A WEEK [name], NP Taking Active   Vitamin D, Ergocalciferol, (DRISDOL) 1.25 MG (50000 UNIT) CAPS capsule [id number] Yes TAKE 1 CAPSULE BY MOUTH EVERY  7 DAYS [name], NP Taking Active             Patient Active Problem List   Diagnosis Date Noted   Moderate episode of recurrent major depressive disorder (HCC) 08/03/2022   Controlled type 2 diabetes mellitus with diabetic polyneuropathy, without long-term current use of insulin (HCC) 08/03/2022   Venous ulcer (HCC) 05/03/2022   Foot pain, right 09/01/2021   Fibrocystic disease of both breasts 05/07/2019   IFG (impaired fasting glucose) 05/03/2019   Chronic venous insufficiency 11/09/2018   Overactive bladder 09/30/2018   Peripheral neuropathy 09/30/2018   Apnea 01/02/2016   Lymphedema 11/30/2014   Dyspnea on exertion 11/30/2014   Anxiety 11/29/2014   Arthritis  11/29/2014   COPD (chronic obstructive pulmonary disease) (HCC) 11/29/2014   Clinical depression 11/29/2014   H/O eating disorder 11/29/2014   Esophageal reflux 11/29/2014   Acne inversa 11/29/2014   Essential hypertension 11/29/2014   HLD (hyperlipidemia) 11/29/2014   Adult hypothyroidism 11/29/2014   Insomnia 11/29/2014   Low back pain 11/29/2014   Morbid obesity (HCC) 11/29/2014   Posttraumatic stress disorder 11/29/2014   Vitamin D deficiency 11/29/2014   Nicotine dependence, cigarettes, uncomplicated 11/29/2014    Conditions to be addressed/monitored per PCP order:  Anxiety and Depression  Care Plan : General Social Work (Adult)  Updates made by Gustavus Bryant, LCSW since 11/03/2022 12:00 AM     Problem: Coping Skills (General Plan of Care)      Long-Range Goal: Coping Skills Enhanced   Start Date: 11/07/2020  Expected End Date: 03/07/2022  Recent Progress: On track  Priority: High  Note:   Timeframe:  Long-Range Goal Priority:  High Start Date:       08/26/21                 Expected End Date:  ongoing  Follow up date-  01/21/23 at 11:15 am   Current Barriers:  Financial constraints Mental Health Concerns  Social Isolation Limited education about mental health support resources that are available to her within the local area* Cognitive Deficits Lacks knowledge of community resource   Clinical Social Work Clinical Goal(s):  Over the next 90 days, client will work with SW to address concerns related to experiencing ongoing symptoms of depression, sadness and grief since the passing of her mother Over the next 90 days, patient will work with LCSW to address needs related to implementing appropriate self-care and depression management.    Interventions: Patient interviewed and appropriate assessments performed Provided mental health counseling with regards to patient's loss and ongoing stressors. Patient was very close with her mother who passed away a few years ago  and she continues to experience symptoms of grief from this loss. Patient denies wanting LCSW to make a referral for grief counseling through [facility] again but was receptive to coping skill and resource education/information that was provided during session today. Patient reports decorating for the holidays helps her to connect with her mother as they did this together when she was alive.  Provided patient with information about managing depression within her daily life. Patient has ongoing grief symptoms as well. Patient states "I feel worn down." Patient reports that she talks out loud to her mother's picture and this is very helpful to her. Grief support resource education provided again during session today.  Provided reflective listening and implemented appropriate interventions to help support patient and her emotional needs  Discussed plans with patient for ongoing care management follow up and provided  patient with direct contact information for [facility] team Advised patient to contact [facility] team with any urgent case management needs. Assisted patient/caregiver with obtaining information about health plan benefits LCSW completed past referral for family to have a wheelchair ramp built at their residence due to ongoing mobility concerns. Ramp has been successfully installed.  PCS actively involved with patient and providing ongoing services.  Positive reinforcement provided to patient for quitting smoking cigarettes. Coping skill review education provided for future triggers.  Brief self-care education provided to both patient and spouse. Patient confirms that she continues to go to food pantry for food insecurity. Resource information has been provided.  Patient reports ongoing pain and issues with mobility. Patient shares that her pain resides in her pains and legs. Emotional support and coping skill education provided. Patient confirms that her PCS aide continues to provide her with services.  However, aide has to be reminded to put on her mask since patient is high risk. LCSW reviewed resources that [name] provided her with in the past.  Patient reports that she had a wonderful birthday spent with her family this past May. Patient reports receiving appropriate socialization. Patient reports that her spouse took her out to eat and gifted her with several things which was a nice surprise.  Patient confirms stable transportation arrangements for upcoming PCP appointment this week.  Patient repots that she is losing a lot of weight but is not sure how much she has lost as she does not have a scale. Patient reports that a goal of hers is to lose weight but she has not been intentionally losing weight at this time.  Patient reports that her aide [place] continues to assist her with PCS and is an excellent support within the home for her and spouse.  Patient planted a tomato plant and successfully grew over 5 tomatoes over the summer. Patient is very proud of this achievement as this was her first time planting a tomato plant and is now interested in picking up gardening/planting as a hobby. Patient reports that her aunt continues to be a positive support for her in her life and someone that she can rely on in the case of emergencies.  Patient was educated on healthy self-care skills and advised her to use these into her daily routine.  04/10/21- Update- Patient is agreeable to social work closure at this time as she still does not need medication management or counseling as she is managing her mental health well at this time. Patient talks to her younger sister everyday to gain emotional support. Patient says that her sister is into body building and helps to educate and remind her to do her own self-care.  Patient reports that she has been elevating her legs daily to elevate pain and swelling.  [facility] LCSW completed care coordination with Osa Craver on 01/15/21 and [facility] LCSW provided the following  suggested resource-It is likely their best option given their loss of housing and lack of consistent income:   Research scientist (physical sciences)                                                                                                                       (  Emergency Crisis Housing) [phone] Ext. 104 M-F, 8am - 4pm Patient was successfully approved for food stamps but is still experiencing financial concerns. She reports that she and her husband have no clothes that fit them and they are having to start using a string for their pants and patient only has one bra at this time. [facility] LCSW will send referral to [facility] BSW today for financial support.  BSW spoke with patient about resources needed. Patient states she does receive SSI and was approved for foodstamps. Patient is familiar with all of the food pantries and other resources in [place]. BSW will assist patient with finding an eye doctor. Patient states she has not been in 10 years. BSW will research and put a list of eye doctors together that accept Medicaid in [facility]. 04/30/20: BSW contacted and spoke with patient, she stated she was not doing good because she found out that she has a new landlord and they are going up on her rent. BSW informed patient to make sure she informs her social workers at [employer] that they have an increase for foodstamps and Medicaid. Patient stated that she sleeps on the couch and is in need of a power recliner/lift chair. BSW will send patients PCP a message asking for a script to be sent. 05/21/21: BSW completed follow up telephone call with patient. She states she is unsure if their new landlord has received their January rent. Patient states she is also worried due to the new landlord making them pay rent online and on the 1st of the month. Patient states she is unable to get anyone on the phone. Patient states her rent will go up to 400 from 255 in March. Patient states she does not have many  clothes but goes to [employer] to get some Quarry manager. No other resources needed at this time.   06/20/21: BSW completed follow up telelphone call with patient. Patient states she is doing well. Patient states Rolene Arbour will cover all of Vaccines. Patient states no resources are needed at this time. 08/26/21: BSW completed telephone outreach with patient, she stated since going to the hospital she has been feeling weak. Patient states other than that she is feeling okay. Patient stated she is going to get a new phone on Wednesday. Patient states her birthday is on Friday and her husband may take her to a [place]. No other resources are needed at this time.  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 Car is doing better, they are still able to drive and get around. Patient states husbands dad has been helping them with there transportation and bills. They landlord has gone up to 600 on lot rent, patient states they are going to have to sell there home and move.  Update- Patient reports experiencing additional grief lately. [facility] LCSW offered referral for berievement counseling but she declined. UPDATE 08/26/21- Patient reports that she has a near death experience last month. Patient shares that she had a fall. 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 [facility] LCSW completed education on available mental health resources within her area to utilize. Patient admits to experiencing some sadness due to grief from her mother's passing. Emotional support provided. 10/13/21 UPDATE- Patient reports that she is working hard on living a healthier life both physically and mentally. Patient reports that  she is making active changes in her diet. She reports that she eventually wishes to quit smoking again and has started picking up new hobbies to  implement during times of craving such as coloring, crocheting and being creative. Patient reports that she is unsure if she needs mental health support at this time because her main concerns are centered around her and her spouse's physical health instead their mental health. [facility] LCSW educated patient on how they affect one another and their importance. Patient denies any urgent concerns. She reports no issues with affording food due to food stamp assistance. [facility] LCSW will reassess social work needs 30 days from now. UPDATE 11/17/21- Patient reports that she is experiencing several stressors at this time. She was encouraged to consider mental health treatment but she is not interested at this time. Patient was encouraged to consider mental health resources. Patient reports that their car is in the shop and that they have no stable transportation besides their sister and her husband's father. Patient reports that her spouse has had several recent health issues that have affected her mental state. Patient was educated on healthy coping skills for stress. Patient denies no urgent concerns or issues at this time. UPDATE- Care Coordination completed with [facility] LCSW, PCP office, Pharmacist, [facility] BSW and PCP. Patient has canceled several medical appointments because they are unable to afford a copay. Patient will benefit from financial assistance and may benefit from a [facility] application. Patient has an upcoming appointment with [facility] BSW next week. Patient is agreeable to exploring financial assistance options with [facility] BSW that will help alleviate other areas of financial stress within their life in order for patient to be able to afford medical expenses. [facility] LCSW encouraged patient to consider individual counseling. Patient is agreeable to referral for counseling but needs to find out her sister's email first as she is unable to go to office to fill out intake paperwork. Patient prefers virtual counseling to save money  on transportation. [facility] LCSW will successfully place referral to [facility] during next outreach. [facility] LCSW completed care coordination with [facility] BSW.  BSW 12/15/21: BSW completed a telephone outreach with patient. She stated everything is still going okay. She has been having problems with her leg wraps and has an appointment today. She states her husband is still having issues with his eye and with the doctor and she is going to make a compliant. Patient states no resources are needed at this time. [facility] LCSW 01/19/22: Patient reports that she was unable to sleep last night. She is experiencing a break out skin reaction that she feels is caused by stress. Her financial stressors have increased since last session. Patient is agreeable to [facility] LCSW placing referral for [facility] now but reports that she has no email address. [facility] LCSW completed referral and made note of her inability to get into her email. [facility] LCSW will follow up within two weeks to ensure that she was enrolled in [facility] counseling program. [facility] completed care coordination call ONLY to [facility] and they were unable to locate patient's referral that was made on 01/19/22. [facility] LCSW resubmitted this referral today on 01/29/22 for telephonic counseling. Patient was made aware by telephone call that [facility] will contact her by the end of the day tomorrow to schedule a time for her to come into their office to complete in person intake paperwork. Patient is agreeable to this plan. UPDATE 02/03/22- Patient reports that she missed a  call from June the receptionist at [facility] and call her back but has not heard back yet. [facility] LCSW provided patient with her number and directions on which prompt to enter to be directed to June. Patient will ask June about her phone as patient is unsure if she can make video calls and complete telephonic therapy which would mean she would need to go for in person sessions.  Patient is agreeable to plan. Patient was able to find an assistance program for rent through [facility] on her own. Positive reinforcement provided for this resource find. [facility] LCSW collaborated with entire team. [facility] LCSW will follow up in two weeks to ensure that patient was able to get connected with either telephonic or in person therapy services at [facility]. UPDATE 02/23/22-Patient has decided to decline therapy at this time stating that she "just has too much going on to add in anything else into her schedule." [facility] LCSW will update team on this decision. Reflective and active listening provided during session. [facility] LCSW will follow up in 90 days to ask patient again about mental health support involvement. Patient is agreeable to this plan. 04/24/22- Patient reports that she is still experiencing a skin reaction from off brand dish soap that she used. She reports that this has been going on for months. Healthy self-care education provided to patient to promote healthy hygiene. Patient reports that she is still not interested in counseling right now but confirms that she is stable. She reports that she is now on oxygen which she started back in the fall of 2023. She shares that she has NOT been smoking. Positive reinforcement provided for this. Patient reports that she put up two Christmas trees this year and one was her mother's. Patient reports that her mother's Christmas tree serves as a reminder of the love and special relationship that she had with her mother. [facility] LCSW 06/25/22 update- Patient reports that she is stable and her emotions/feelings have been more manageable lately but she and her spouse are still struggling financially. Patient still declines wanting a referral for a long term therapist so that she can help manage these financial stressors that trigger her stress. Patient is aware of [facility] resource that she was very close to starting therapy with in 2023. Stress management  education provided. Patient is agreeable to a 60 day follow up call. 07/28/22- Patient shares that her depression has lifted recently but she is worried as the anniversary of her moms' passing is coming soon. [facility] LCSW provided reeducation on her available resources. She was educated on healthy self-care and reports that she is able to get outside today to get some Vitamin D and spend time with her nieces, nephews and sister. Positive reinforcement provided. 08/24/22- Patient has been experiencing recent grief stressors and was tearful during social work session. Patient was strongly encouraged to consider grief therapy as her uncle buck and aunt sissy are both in critical care at the hospital. [facility] LCSW provided extensive emotional support and will do a 30 day follow up call due to recent additional stressors. Update- Patient's uncle passed and she is experiencing some grief related to this loss. Emotional support provided. [facility] LCSW encouraged patient to consider reaching out to [facility] to gain therapy but she reports not needing this support right now. 11/03/22- Patient reports that she has been making a daily effort to eat healthy and attempt to be a little more active. Patient reports that she has lost over 100  lbs. [facility] LCSW provided extensive positive reinforcement for this achievement. Patient was encouraged to continue putting in place healthy boundaries with negative people, places and things to maintain her happiness and health.   Past BSW update-BSW completed a telephone outreach with patient. She stated she is still itching really bad and thinks she has shingles. She has put up her Christmas decorations and has 2 trees. Patient states she has been stressed about medical bills, but the holidays are keeping her in good spirits. Patient states they are still having financial issues.  BSW completed a telephone outreach with patient. She stated she is doing well. They were able to go to a wholesale store  in [place] and get some food. Patient stated she has some appointments coming up including the skin doctor but that is not until November. Patient stated she is on the list for a sooner appointment if someone cancels. BSW contacted other skin centers in the area and they do not accept patients insurance. No other resources are needed at this time. BSW completed a telephone outreach with patient, she stated they may start looking for somewhere else to live due to the rent rising in. Patient stated they are okay, and surviving. Patient stated no resources are needed at this time.  Managing Loss, Adult People experience loss in many different ways throughout their lives. Events such as moving, changing jobs, and losing friends can create a sense of loss. The loss may be as serious as a major health change, divorce, death of a pet, or death of a loved one. All of these types of loss are likely to create a physical and emotional reaction known as grief. Grief is the result of a major change or an absence of something or someone that you count on. Grief is a normal reaction to loss. A variety of factors can affect your grieving experience, including: The nature of your loss. Your relationship to what or whom you lost. Your understanding of grief and how to manage it. Your support system. How to manage lifestyle changes Keep to your normal routine as much as possible. If you have trouble focusing or doing normal activities, it is acceptable to take some time away from your normal routine. Spend time with friends and loved ones. Eat a healthy diet, get plenty of sleep, and rest when you feel tired. How to recognize changes  The way that you deal with your grief will affect your ability to function as you normally do. When grieving, you may experience these changes: Numbness, shock, sadness, anxiety, anger, denial, and guilt. Thoughts about death. Unexpected crying. A physical sensation of emptiness in  your stomach. Problems sleeping and eating. Tiredness (fatigue). Loss of interest in normal activities. Dreaming about or imagining seeing the person who died. A need to remember what or whom you lost. Difficulty thinking about anything other than your loss for a period of time. Relief. If you have been expecting the loss for a while, you may feel a sense of relief when it happens. Follow these instructions at home:    Activity Express your feelings in healthy ways, such as: Talking with others about your loss. It may be helpful to find others who have had a similar loss, such as a support group. Writing down your feelings in a journal. Doing physical activities to release stress and emotional energy. Doing creative activities like painting, sculpting, or playing or listening to music. Practicing resilience. This is the ability to recover and adjust after  facing challenges. Reading some resources that encourage resilience may help you to learn ways to practice those behaviors. General instructions Be patient with yourself and others. Allow the grieving process to happen, and remember that grieving takes time. It is likely that you may never feel completely done with some grief. You may find a way to move on while still cherishing memories and feelings about your loss. Accepting your loss is a process. It can take months or longer to adjust. Keep all follow-up visits as told by your health care provider. This is important. Where to find support To get support for managing loss: Ask your health care provider for help and recommendations, such as grief counseling or therapy. Think about joining a support group for people who are managing a loss. Where to find more information You can find more information about managing loss from: American Society of Clinical Oncology: www.cancer.net American Psychological Association: DiceTour[employer].ca Contact a health care provider if: Your grief is extreme  and keeps getting worse. You have ongoing grief that does not improve. Your body shows symptoms of grief, such as illness. You feel depressed, anxious, or lonely. Get help right away if: You have thoughts about hurting yourself or others. If you ever feel like you may hurt yourself or others, or have thoughts about taking your own life, get help right away. You can go to your nearest emergency department or call: Your local emergency services (911 in the [place] A suicide crisis helpline, such as the National Suicide Prevention Lifeline at 743-382-0376. This is open 24 hours a day. Summary Grief is the result of a major change or an absence of someone or something that you count on. Grief is a normal reaction to loss. The depth of grief and the period of recovery depend on the type of loss and your ability to adjust to the change and process your feelings. Processing grief requires patience and a willingness to accept your feelings and talk about your loss with people who are supportive. It is important to find resources that work for you and to realize that people experience grief differently. There is not one grieving process that works for everyone in the same way. Be aware that when grief becomes extreme, it can lead to more severe issues like isolation, depression, anxiety, or suicidal thoughts. Talk with your health care provider if you have any of these issues. This information is not intended to replace advice given to you by your health care provider. Make sure you discuss any questions you have with your health care provider. Document Revised: 06/17/2018 Document Reviewed: 08/27/2016 Elsevier Patient Education  2020 [employer].  Help for Managing Grief   When you are experiencing grief, many resources and support are available to help you. You are not alone.    Grief Share (https://www.SunglassSpecialist.gl):    [employer] of [address] [place], [place]       [employer]  [phone] [address] [place], [place]     [employer] [address] [place], [place]      [employer] [address] [place], [place]  [phone]   [facility] [address] [facility] [facility] [address] [place], [place] [phone]   [facility] [address] [place], [place]     [phone]   [facility] [address] [place], [place]     [phone]   If a Hospice or Palliative Care team has  been involved in the care of your loved one, you may reach out to your contact there or locally, you may reach out to [facility]:    [facility]   [address] [place], [place] [address] [phone] Patient was educated on healthy self-care as she admits she has little motivation to walk lately. PCS recommendation was suggested.   Patient Self Care Activities:  Attends all scheduled provider appointments Calls provider office for new concerns or questions  Please see past updates related to this goal by clicking on the "Past Updates" button in the selected goal          24- Hour Availability:    [facility]  [address] [place], [place] [phone] Crisis [phone]   [facility] [phone]  [facility]  [phone]    [facility]  [phone] (after hours)   Therapeutic Alternative/Mobile Crisis   [phone]   [place] National Suicide Hotline  718-084-3883 Len Child[place]) [place] [address]   Call [phone] for mental health emergencies   [facility]  [phone]);  [place] and [employer]  [phone]); [place], [place], [place], [place], [place], [place], [place]    [facility] Residents For 24/7 walk-up access to mental health services for [facility] children  (4+), adolescents and adults, please visit the [facility] located at [address] in [place], [place].  *[facility] also provides comprehensive outpatient behavioral health services in a variety of locations around the [place].  Connect With [place] [address] [place], [place] [address] HelpLine: 660-229-0591 or [phone]  Get Directions  Find Help 24/7 By Phone Call our 24-hour HelpLine at [phone] or 815-621-5521 for immediate assistance for mental health and substance abuse issues.  Walk-In Help [place]: [facility] (Ages 4 and Up) Y-O [place]: Emergency Dept., [facility] Additional Resources National Hopeline Network: 1-800-SUICIDE The National Suicide Prevention Lifeline: 1-800-273-TALK      Follow up:  Patient agrees to Care Plan and Follow-up.  Plan: The Managed Medicaid care management team will reach out to the patient again over the next 90 days.  [name], BSW, MSW, [employer] Managed Medicaid LCSW [facility]  [facility] [email or web address] Phone: [phone]

## 2022-11-05 DIAGNOSIS — Z993 Dependence on wheelchair: Secondary | ICD-10-CM | POA: Diagnosis not present

## 2022-11-05 DIAGNOSIS — R3981 Functional urinary incontinence: Secondary | ICD-10-CM | POA: Diagnosis not present

## 2022-11-09 ENCOUNTER — Encounter: Payer: Self-pay | Admitting: Physician Assistant

## 2022-11-09 ENCOUNTER — Ambulatory Visit (INDEPENDENT_AMBULATORY_CARE_PROVIDER_SITE_OTHER): Payer: Medicaid Other | Admitting: Physician Assistant

## 2022-11-09 VITALS — BP 111/50 | HR 80

## 2022-11-09 DIAGNOSIS — J449 Chronic obstructive pulmonary disease, unspecified: Secondary | ICD-10-CM

## 2022-11-09 DIAGNOSIS — I10 Essential (primary) hypertension: Secondary | ICD-10-CM | POA: Diagnosis not present

## 2022-11-09 DIAGNOSIS — E1142 Type 2 diabetes mellitus with diabetic polyneuropathy: Secondary | ICD-10-CM | POA: Diagnosis not present

## 2022-11-09 DIAGNOSIS — E039 Hypothyroidism, unspecified: Secondary | ICD-10-CM | POA: Diagnosis not present

## 2022-11-09 LAB — BAYER DCA HB A1C WAIVED: HB A1C (BAYER DCA - WAIVED): 5.9 % — ABNORMAL HIGH (ref 4.8–5.6)

## 2022-11-09 MED ORDER — HYDROXYZINE PAMOATE 25 MG PO CAPS: 25.0000 mg | ORAL_CAPSULE | Freq: Three times a day (TID) | ORAL | 1 refills | Status: AC | PRN

## 2022-11-09 MED ORDER — HYDRALAZINE HCL 50 MG PO TABS: 50.0000 mg | ORAL_TABLET | Freq: Three times a day (TID) | ORAL | 1 refills | Status: DC

## 2022-11-09 MED ORDER — LEVOTHYROXINE SODIUM 50 MCG PO TABS: ORAL_TABLET | ORAL | 0 refills | Status: DC

## 2022-11-09 MED ORDER — AMLODIPINE BESYLATE 5 MG PO TABS: 5.0000 mg | ORAL_TABLET | Freq: Every day | ORAL | 1 refills | Status: DC

## 2022-11-09 MED ORDER — ALBUTEROL SULFATE (2.5 MG/3ML) 0.083% IN NEBU: INHALATION_SOLUTION | RESPIRATORY_TRACT | 0 refills | Status: DC

## 2022-11-09 MED ORDER — ATORVASTATIN CALCIUM 80 MG PO TABS: 80.0000 mg | ORAL_TABLET | Freq: Every day | ORAL | 3 refills | Status: DC

## 2022-11-09 MED ORDER — TRULICITY 0.75 MG/0.5ML ~~LOC~~ SOAJ: 0.7500 mg | SUBCUTANEOUS | 1 refills | Status: DC

## 2022-11-09 MED ORDER — CITALOPRAM HYDROBROMIDE 40 MG PO TABS: 40.0000 mg | ORAL_TABLET | Freq: Every day | ORAL | 0 refills | Status: DC

## 2022-11-09 NOTE — Assessment & Plan Note (Signed)
Chronic, historic condition  She is taking Trulicity 0.75 mg weekly and reports this seems to be doing well  Continue current regimen Recheck A1c today for monitoring  Follow up in 3 months for monitoring

## 2022-11-09 NOTE — Assessment & Plan Note (Signed)
Chronic, historic condition She reports some SOB and has been using her Nebulizer  She is using Trelegy and nebulizer as directed She is using her oxygen at night  She is no longer driving due to syncope and breathing issues Continue collaboration with Pulmonology  Continue current regimen Follow up as needed for persistent or progressing symptoms or in 3 months for monitoring

## 2022-11-09 NOTE — Progress Notes (Signed)
Established Patient Office Visit  Name: Cheryl Chandler   MRN: [id number]    DOB: [birth year]   Date:11/09/2022  Today's Provider: Jacquelin Hawking, MHS, PA-C Introduced myself to the patient as a PA-C and provided education on APPs in clinical practice.         Subjective  Chief Complaint  Chief Complaint  Patient presents with   Diabetes   Hyperlipidemia   Hypertension    HPI  HYPERTENSION / HYPERLIPIDEMIA Satisfied with current treatment? yes Duration of hypertension: years BP monitoring frequency: daily BP range: 100s/ 80s? BP medication side effects: no Past BP meds: amlodipine and lisinopril, Hydralazine, spironolactone  Duration of hyperlipidemia: years Cholesterol medication side effects: no Cholesterol supplements: none Past cholesterol medications: atorvastain (lipitor) Medication compliance: good compliance Aspirin: no Recent stressors: no Recurrent headaches: no Visual changes: no Palpitations: no Dyspnea: yes Chest pain: yes Lower extremity edema: yes Dizzy/lightheaded: no   Diabetes, Type 2 - Last A1c 6.0 - Medications: She is taking Trulicity 0.75 mg weekly,  - Compliance: good compliance  - Checking BG at home: Checking it 3 times per day, fasting levels are normally around 100s - Diet: she is trying to improve her diet  - Exercise: She reports she is trying to do leg exercises a few times per day  - Eye exam: discussed importance of diabetic eye exam yearly - Foot exam: to be completed at next visit  - Microalbumin: UTD  - Statin: on statin therapy  - PNA vaccine: NA - Denies symptoms of hypoglycemia, polyuria, polydipsia, numbness extremities, foot ulcers/trauma    Patient Active Problem List   Diagnosis Date Noted   Moderate episode of recurrent major depressive disorder (HCC) 08/03/2022   Controlled type 2 diabetes mellitus with diabetic polyneuropathy, without long-term current use of insulin (HCC) 08/03/2022   Venous ulcer  (HCC) 05/03/2022   Foot pain, right 09/01/2021   Fibrocystic disease of both breasts 05/07/2019   IFG (impaired fasting glucose) 05/03/2019   Chronic venous insufficiency 11/09/2018   Overactive bladder 09/30/2018   Peripheral neuropathy 09/30/2018   Apnea 01/02/2016   Lymphedema 11/30/2014   Dyspnea on exertion 11/30/2014   Anxiety 11/29/2014   Arthritis 11/29/2014   COPD (chronic obstructive pulmonary disease) (HCC) 11/29/2014   Clinical depression 11/29/2014   H/O eating disorder 11/29/2014   Esophageal reflux 11/29/2014   Acne inversa 11/29/2014   Essential hypertension 11/29/2014   HLD (hyperlipidemia) 11/29/2014   Adult hypothyroidism 11/29/2014   Insomnia 11/29/2014   Low back pain 11/29/2014   Morbid obesity (HCC) 11/29/2014   Posttraumatic stress disorder 11/29/2014   Vitamin D deficiency 11/29/2014   Nicotine dependence, cigarettes, uncomplicated 11/29/2014    Past Surgical History:  Procedure Laterality Date   APPENDECTOMY     arm surgery Left    pins   fatty tumor removed from back     JOINT REPLACEMENT Right    LEG SURGERY Bilateral    OVARIAN CYST SURGERY Right    took piece of right ovary due to cyst   TOTAL HIP ARTHROPLASTY Right    wisdom teeth removed      Family History  Problem Relation Age of Onset   Seizures Sister        disorders    Social History   Tobacco Use   Smoking status: Former    Current packs/day: 0.00    Average packs/day: 3.0 packs/day for 30.0 years (90.0 ttl pk-yrs)    Types:  Cigarettes    Start date: 01/1992    Quit date: 01/2022    Years since quitting: 0.7   Smokeless tobacco: Never  Substance Use Topics   Alcohol use: No    Alcohol/week: 0.0 standard drinks of alcohol     Current Outpatient Medications:    ACCU-CHEK GUIDE test strip, USE TO CHECK BLOOD SUGAR 3 TIMES DAILY AS DIRECTED, Disp: 300 each, Rfl: 2   Accu-Chek Softclix Lancets lancets, CHECK BLOOD SUGAR TWICE DAILY, Disp: 100 each, Rfl: 1   Blood  Glucose Monitoring Suppl (ACCU-CHEK GUIDE) w/Device KIT, Use to check blood sugar 3 times a day., Disp: 1 kit, Rfl: 1   Blood Pressure Monitoring (OMRON 3 SERIES BP MONITOR) DEVI, Use to check blood pressure as directed, Disp: 1 each, Rfl: 0   diphenhydrAMINE (BENADRYL) 25 mg capsule, Take 25 mg by mouth every 6 (six) hours as needed for itching. Taking once a day, Disp: , Rfl:    EPINEPHrine 0.3 mg/0.3 mL IJ SOAJ injection, INJECT 1 SYRINGE INTO OUTER THIGH ONCE AS NEEDED FOR SEVERE ALLERGIC REACTION., Disp: 2 each, Rfl: 3   Fluticasone-Umeclidin-Vilant (TRELEGY ELLIPTA) 100-62.5-25 MCG/ACT AEPB, Inhale 1 puff into the lungs daily., Disp: 28 each, Rfl: 11   furosemide (LASIX) 20 MG tablet, TAKE 1 TABLET BY MOUTH ONCE DAILY IN AFTERNOON AS NEEDED LEG EDEMA, Disp: 90 tablet, Rfl: 0   gabapentin (NEURONTIN) 300 MG capsule, TAKE 1 CAPSULE BY MOUTH 3 TIMES DAILY ASNEEDED (NEED OFFICE VISIT FOR FURTHER REFILLS), Disp: 270 capsule, Rfl: 0   lisinopril (ZESTRIL) 40 MG tablet, TAKE 1 TABLET BY MOUTH ONCE DAILY, Disp: 90 tablet, Rfl: 0   meloxicam (MOBIC) 15 MG tablet, TAKE 1 TABLET BY MOUTH ONCE DAILY AS NEEDED WITH FOOD OR MILK, Disp: 90 tablet, Rfl: 3   montelukast (SINGULAIR) 10 MG tablet, TAKE 1 TABLET BY MOUTH AT BEDTIME, Disp: 90 tablet, Rfl: 3   nitroGLYCERIN (NITROSTAT) 0.4 MG SL tablet, DISSOLVE 1 TABLET UNDER TONGUE AT ONSET OF CHEST PAIN. REPEAT IN 5 MIN IF NOT RESOLVED, MAX 3 DOSES. 911 IF NEEDED., Disp: 25 tablet, Rfl: 1   omeprazole (PRILOSEC) 20 MG capsule, TAKE 1 CAPSULE BY MOUTH ONCE DAILY, Disp: 30 capsule, Rfl: 12   OXYGEN, Inhale 3 L into the lungs daily., Disp: , Rfl:    spironolactone (ALDACTONE) 25 MG tablet, Take 0.5 tablets (12.5 mg total) by mouth daily., Disp: 45 tablet, Rfl: 1   Vitamin D, Ergocalciferol, (DRISDOL) 1.25 MG (50000 UNIT) CAPS capsule, TAKE 1 CAPSULE BY MOUTH EVERY 7 DAYS, Disp: 12 capsule, Rfl: 1   albuterol (PROVENTIL) (2.5 MG/3ML) 0.083% nebulizer solution, USE 1  VIAL VIA NEBULIZER EVERY 4 HOURS AS NEEDED, Disp: 225 mL, Rfl: 0   amLODipine (NORVASC) 5 MG tablet, Take 1 tablet (5 mg total) by mouth daily., Disp: 90 tablet, Rfl: 1   atorvastatin (LIPITOR) 80 MG tablet, Take 1 tablet (80 mg total) by mouth daily., Disp: 90 tablet, Rfl: 3   citalopram (CELEXA) 40 MG tablet, Take 1 tablet (40 mg total) by mouth daily., Disp: 90 tablet, Rfl: 0   Dulaglutide (TRULICITY) 0.75 MG/0.5ML SOPN, 0.75 mg by ultrasound guided injection route once a week., Disp: 6 mL, Rfl: 1   hydrALAZINE (APRESOLINE) 50 MG tablet, Take 1 tablet (50 mg total) by mouth 3 (three) times daily., Disp: 270 tablet, Rfl: 1   hydrOXYzine (VISTARIL) 25 MG capsule, Take 1 capsule (25 mg total) by mouth every 8 (eight) hours as needed., Disp: 270 capsule, Rfl:  1   levothyroxine (SYNTHROID) 50 MCG tablet, TAKE 1 TABLET BY MOUTH ONCE DAILY ON AN EMPTY STOMACH. WAIT 30 MINUTES BEFORE TAKING OTHER MEDS., Disp: 90 tablet, Rfl: 0  Allergies  Allergen Reactions   Codeine Shortness Of Breath and Rash   Percocet [Oxycodone-Acetaminophen] Shortness Of Breath   Sulfa Antibiotics Shortness Of Breath and Rash   Aspirin Hives   Bee Venom     Bee stings   Darvon [Propoxyphene]     Darvocet-N 100   Latex    Oxycodone Nausea And Vomiting   Penicillins     Has patient had a PCN reaction causing immediate rash, facial/tongue/throat swelling, SOB or lightheadedness with hypotension: Unknown Has patient had a PCN reaction causing severe rash involving mucus membranes or skin necrosis: Unknown Has patient had a PCN reaction that required hospitalization: Unknown Has patient had a PCN reaction occurring within the last 10 years: Unknown If all of the above answers are "NO", then may proceed with Cephalosporin use.    I personally reviewed active problem list, medication list, allergies, health maintenance, notes from last encounter, lab results with the patient/caregiver today.   Review of Systems   Constitutional:  Negative for chills, fever and malaise/fatigue.  Eyes:  Negative for blurred vision and double vision.  Respiratory:  Positive for shortness of breath. Negative for cough and wheezing.   Cardiovascular:  Positive for leg swelling. Negative for chest pain and palpitations.  Neurological:  Negative for dizziness and headaches.      Objective  Vitals:   11/09/22 1313  BP: (!) 111/50  Pulse: 80  SpO2: 95%    There is no height or weight on file to calculate BMI.  Physical Exam Vitals reviewed.  Constitutional:      General: She is awake.     Appearance: Normal appearance. She is well-developed and well-groomed.  HENT:     Head: Normocephalic and atraumatic.  Cardiovascular:     Rate and Rhythm: Normal rate and regular rhythm.     Pulses: Normal pulses.          Radial pulses are 2+ on the right side and 2+ on the left side.     Heart sounds: Heart sounds are distant.     Comments: Unable to assess swelling as legs are wrapped in ACE bandages  Pulmonary:     Effort: Pulmonary effort is normal.     Breath sounds: Decreased air movement present. Examination of the right-upper field reveals decreased breath sounds. Examination of the right-middle field reveals decreased breath sounds. Decreased breath sounds present. No wheezing, rhonchi or rales.  Musculoskeletal:     Cervical back: Normal range of motion.  Neurological:     General: No focal deficit present.     Mental Status: She is alert and oriented to person, place, and time.  Psychiatric:        Mood and Affect: Mood normal.        Behavior: Behavior normal. Behavior is cooperative.        Thought Content: Thought content normal.        Judgment: Judgment normal.      Recent Results (from the past 2160 hour(s))  Blood gas, arterial     Status: Abnormal   Collection Time: 08/14/22  2:10 PM  Result Value Ref Range   FIO2 0.21 %   pH, Arterial 7.45 7.35 - 7.45   pCO2 arterial 40 32 - 48 mmHg    pO2, Arterial 78 (L) 83 -  108 mmHg   Bicarbonate 27.8 20.0 - 28.0 mmol/L   Acid-Base Excess 3.5 (H) 0.0 - 2.0 mmol/L   O2 Saturation 96.8 %   Patient temperature 37.0    Collection site RIGHT RADIAL    Allens test (pass/fail) PASS PASS    Comment: Performed at [facility], [address] [place], [place] [address]     PHQ2/9:    11/09/2022    1:17 PM 08/03/2022    1:44 PM 05/01/2022   10:30 AM 03/17/2022   10:42 AM 12/16/2021   11:39 AM  Depression screen PHQ 2/9  Decreased Interest 3 2 2  0 0  Down, Depressed, Hopeless 3 2 2  0 2  PHQ - 2 Score 6 4 4  0 2  Altered sleeping 3 3 3 3 3   Tired, decreased energy 3 3 3 3 3   Change in appetite 0 0 2 2 0  Feeling bad or failure about yourself  0 0 0 0 2  Trouble concentrating 0 0 2 0 0  Moving slowly or fidgety/restless 0 0 2 2 0  Suicidal thoughts 0 0 0 0 0  PHQ-9 Score 12 10 16 10 10   Difficult doing work/chores Somewhat difficult Somewhat difficult Somewhat difficult Somewhat difficult Somewhat difficult      Fall Risk:    11/09/2022    1:17 PM 08/03/2022    1:44 PM 05/01/2022   10:30 AM 03/17/2022   10:42 AM 12/16/2021   11:39 AM  Fall Risk   Falls in the past year? 0 Exclusion - non ambulatory 0 0 0  Number falls in past yr: 0  0 0 0  Injury with Fall? 0  0 0 0  Risk for fall due to : No Fall Risks  No Fall Risks No Fall Risks No Fall Risks  Follow up Falls evaluation completed Falls evaluation completed Falls evaluation completed Falls evaluation completed Falls evaluation completed      Functional Status Survey:      Assessment & Plan  Problem List Items Addressed This Visit       Cardiovascular and Mediastinum   Essential hypertension    Chronic, historic condition Appears well controlled with current regimen comprised of Hydralazine 50 mg PO TID, Spironolactone 12.5 mg po every day, Lisinopril 40 mg PO every day, Amlodipine 5 mg PO every day Continue current regimen Continue home BP checks as  directed Follow up in 6 months or sooner if concerns arise        Relevant Medications   hydrALAZINE (APRESOLINE) 50 MG tablet   amLODipine (NORVASC) 5 MG tablet   atorvastatin (LIPITOR) 80 MG tablet     Respiratory   COPD (chronic obstructive pulmonary disease) (HCC)    Chronic, historic condition She reports some SOB and has been using her Nebulizer  She is using Trelegy and nebulizer as directed She is using her oxygen at night  She is no longer driving due to syncope and breathing issues Continue collaboration with Pulmonology  Continue current regimen Follow up as needed for persistent or progressing symptoms or in 3 months for monitoring        Relevant Medications   albuterol (PROVENTIL) (2.5 MG/3ML) 0.083% nebulizer solution     Endocrine   Adult hypothyroidism    Chronic, appears well controlled on current regimen Reviewed most recent labs Currently taking Levothyroxine 50 mg pO every day and appears to be tolerating well Recheck thyroid panel in about 3 months Refills provided of medication Follow up  in 3 months or sooner if concerns arise        Relevant Medications   levothyroxine (SYNTHROID) 50 MCG tablet   Controlled type 2 diabetes mellitus with diabetic polyneuropathy, without long-term current use of insulin (HCC) - Primary    Chronic, historic condition  She is taking Trulicity 0.75 mg weekly and reports this seems to be doing well  Continue current regimen Recheck A1c today for monitoring  Follow up in 3 months for monitoring       Relevant Medications   citalopram (CELEXA) 40 MG tablet   hydrOXYzine (VISTARIL) 25 MG capsule   atorvastatin (LIPITOR) 80 MG tablet   Dulaglutide (TRULICITY) 0.75 MG/0.5ML SOPN   Other Relevant Orders   Bayer DCA Hb A1c Waived (STAT)     Return in about 3 months (around 02/09/2023) for HTN, Diabetes follow up, thyroid, labs .   I, Kealani Leckey E Dory Verdun, PA-C, have reviewed all documentation for this visit. The  documentation on 11/09/22 for the exam, diagnosis, procedures, and orders are all accurate and complete.   Jacquelin Hawking, MHS, PA-C [facility]

## 2022-11-09 NOTE — Assessment & Plan Note (Addendum)
Chronic, historic condition Appears well controlled with current regimen comprised of Hydralazine 50 mg PO TID, Spironolactone 12.5 mg po every day, Lisinopril 40 mg PO every day, Amlodipine 5 mg PO every day Continue current regimen Continue home BP checks as directed Follow up in 6 months or sooner if concerns arise

## 2022-11-09 NOTE — Progress Notes (Signed)
Your A1c was 5.9 which is excellent. Please continue with your current medications

## 2022-11-09 NOTE — Assessment & Plan Note (Signed)
Chronic, appears well controlled on current regimen Reviewed most recent labs Currently taking Levothyroxine 50 mg pO every day and appears to be tolerating well Recheck thyroid panel in about 3 months Refills provided of medication Follow up in 3 months or sooner if concerns arise

## 2022-11-10 ENCOUNTER — Ambulatory Visit (INDEPENDENT_AMBULATORY_CARE_PROVIDER_SITE_OTHER): Payer: Medicaid Other | Admitting: Nurse Practitioner

## 2022-11-10 ENCOUNTER — Encounter (INDEPENDENT_AMBULATORY_CARE_PROVIDER_SITE_OTHER): Payer: Self-pay | Admitting: Nurse Practitioner

## 2022-11-10 VITALS — BP 130/79 | HR 70 | Resp 20

## 2022-11-10 DIAGNOSIS — I83009 Varicose veins of unspecified lower extremity with ulcer of unspecified site: Secondary | ICD-10-CM

## 2022-11-10 DIAGNOSIS — L97909 Non-pressure chronic ulcer of unspecified part of unspecified lower leg with unspecified severity: Secondary | ICD-10-CM | POA: Diagnosis not present

## 2022-11-10 NOTE — Progress Notes (Signed)
 History of Present Illness  There is no documented history at this time  Assessments & Plan   There are no diagnoses linked to this encounter.    Additional instructions  Subjective:  Patient presents with venous ulcer of the Bilateral lower extremity.    Procedure:  3 layer unna wrap was placed Bilateral lower extremity.   Plan:   Follow up in one week.  

## 2022-11-16 ENCOUNTER — Ambulatory Visit (INDEPENDENT_AMBULATORY_CARE_PROVIDER_SITE_OTHER): Payer: Medicaid Other | Admitting: Vascular Surgery

## 2022-11-16 ENCOUNTER — Encounter (INDEPENDENT_AMBULATORY_CARE_PROVIDER_SITE_OTHER): Payer: Self-pay | Admitting: Vascular Surgery

## 2022-11-16 VITALS — BP 127/74 | HR 67 | Resp 16

## 2022-11-16 DIAGNOSIS — L97329 Non-pressure chronic ulcer of left ankle with unspecified severity: Secondary | ICD-10-CM

## 2022-11-16 DIAGNOSIS — I10 Essential (primary) hypertension: Secondary | ICD-10-CM | POA: Diagnosis not present

## 2022-11-16 DIAGNOSIS — E1142 Type 2 diabetes mellitus with diabetic polyneuropathy: Secondary | ICD-10-CM | POA: Diagnosis not present

## 2022-11-16 DIAGNOSIS — I83023 Varicose veins of left lower extremity with ulcer of ankle: Secondary | ICD-10-CM | POA: Insufficient documentation

## 2022-11-16 DIAGNOSIS — E785 Hyperlipidemia, unspecified: Secondary | ICD-10-CM | POA: Diagnosis not present

## 2022-11-16 DIAGNOSIS — I872 Venous insufficiency (chronic) (peripheral): Secondary | ICD-10-CM

## 2022-11-16 NOTE — Progress Notes (Signed)
MRN : [id number]  Cheryl Chandler is a 57 y.o. (04/23/1966) female who presents with chief complaint of legs hurt and swell.  History of Present Illness:   Patient is seen for follow up evaluation of leg pain and swelling associated with a new left venous ulceration.  The patient notes that her legs still swell quite a bit by the end of the day and become fairly painful.  She essentially uses a wheelchair to move around her home.  She also notes that she is on home oxygen.  She denies any recent open sores or wounds.  No drainage.  She has been using her lymphedema pump.   The patient states that they have been elevating as much as possible. The patient denies any recent changes in medications.   The patient denies a history of DVT or PE. There is no prior history of phlebitis. There is no history of primary lymphedema.   No SOB or increased cough.  No sputum production.  No recent episodes of CHF exacerbation.  Current Meds  Medication Sig   ACCU-CHEK GUIDE test strip USE TO CHECK BLOOD SUGAR 3 TIMES DAILY AS DIRECTED   Accu-Chek Softclix Lancets lancets CHECK BLOOD SUGAR TWICE DAILY   albuterol (PROVENTIL) (2.5 MG/3ML) 0.083% nebulizer solution USE 1 VIAL VIA NEBULIZER EVERY 4 HOURS AS NEEDED   amLODipine (NORVASC) 5 MG tablet Take 1 tablet (5 mg total) by mouth daily.   atorvastatin (LIPITOR) 80 MG tablet Take 1 tablet (80 mg total) by mouth daily.   Blood Glucose Monitoring Suppl (ACCU-CHEK GUIDE) w/Device KIT Use to check blood sugar 3 times a day.   Blood Pressure Monitoring (OMRON 3 SERIES BP MONITOR) DEVI Use to check blood pressure as directed   citalopram (CELEXA) 40 MG tablet Take 1 tablet (40 mg total) by mouth daily.   diphenhydrAMINE (BENADRYL) 25 mg capsule Take 25 mg by mouth every 6 (six) hours as needed for itching. Taking once a day   Dulaglutide (TRULICITY) 0.75 MG/0.5ML SOPN 0.75 mg by ultrasound guided injection route once a week.   EPINEPHrine 0.3  mg/0.3 mL IJ SOAJ injection INJECT 1 SYRINGE INTO OUTER THIGH ONCE AS NEEDED FOR SEVERE ALLERGIC REACTION.   Fluticasone-Umeclidin-Vilant (TRELEGY ELLIPTA) 100-62.5-25 MCG/ACT AEPB Inhale 1 puff into the lungs daily.   furosemide (LASIX) 20 MG tablet TAKE 1 TABLET BY MOUTH ONCE DAILY IN AFTERNOON AS NEEDED LEG EDEMA   gabapentin (NEURONTIN) 300 MG capsule TAKE 1 CAPSULE BY MOUTH 3 TIMES DAILY ASNEEDED (NEED OFFICE VISIT FOR FURTHER REFILLS)   hydrALAZINE (APRESOLINE) 50 MG tablet Take 1 tablet (50 mg total) by mouth 3 (three) times daily.   hydrOXYzine (VISTARIL) 25 MG capsule Take 1 capsule (25 mg total) by mouth every 8 (eight) hours as needed.   levothyroxine (SYNTHROID) 50 MCG tablet TAKE 1 TABLET BY MOUTH ONCE DAILY ON AN EMPTY STOMACH. WAIT 30 MINUTES BEFORE TAKING OTHER MEDS.   lisinopril (ZESTRIL) 40 MG tablet TAKE 1 TABLET BY MOUTH ONCE DAILY   meloxicam (MOBIC) 15 MG tablet TAKE 1 TABLET BY MOUTH ONCE DAILY AS NEEDED WITH FOOD OR MILK   montelukast (SINGULAIR) 10 MG tablet TAKE 1 TABLET BY MOUTH AT BEDTIME   nitroGLYCERIN (NITROSTAT) 0.4 MG SL tablet DISSOLVE 1 TABLET UNDER TONGUE AT ONSET OF CHEST PAIN. REPEAT IN 5 MIN IF NOT RESOLVED, MAX 3 DOSES. 911 IF NEEDED.   omeprazole (PRILOSEC) 20 MG capsule TAKE 1 CAPSULE BY MOUTH ONCE DAILY  OXYGEN Inhale 3 L into the lungs daily.   spironolactone (ALDACTONE) 25 MG tablet Take 0.5 tablets (12.5 mg total) by mouth daily.   Vitamin D, Ergocalciferol, (DRISDOL) 1.25 MG (50000 UNIT) CAPS capsule TAKE 1 CAPSULE BY MOUTH EVERY 7 DAYS    Past Medical History:  Diagnosis Date   Arthritis    Asthma    COPD (chronic obstructive pulmonary disease) (HCC)    Hypertension    Neuropathy    Pericarditis    Personal history of urinary calculi 11/29/2014   Thyroid disease     Past Surgical History:  Procedure Laterality Date   APPENDECTOMY     arm surgery Left    pins   fatty tumor removed from back     JOINT REPLACEMENT Right    LEG SURGERY  Bilateral    OVARIAN CYST SURGERY Right    took piece of right ovary due to cyst   TOTAL HIP ARTHROPLASTY Right    wisdom teeth removed      Social History Social History   Tobacco Use   Smoking status: Former    Current packs/day: 0.00    Average packs/day: 3.0 packs/day for 30.0 years (90.0 ttl pk-yrs)    Types: Cigarettes    Start date: 01/1992    Quit date: 01/2022    Years since quitting: 0.8   Smokeless tobacco: Never  Vaping Use   Vaping status: Never Used  Substance Use Topics   Alcohol use: No    Alcohol/week: 0.0 standard drinks of alcohol   Drug use: No    Family History Family History  Problem Relation Age of Onset   Seizures Sister        disorders    Allergies  Allergen Reactions   Codeine Shortness Of Breath and Rash   Percocet [Oxycodone-Acetaminophen] Shortness Of Breath   Sulfa Antibiotics Shortness Of Breath and Rash   Aspirin Hives   Bee Venom     Bee stings   Darvon [Propoxyphene]     Darvocet-N 100   Latex    Oxycodone Nausea And Vomiting   Penicillins     Has patient had a PCN reaction causing immediate rash, facial/tongue/throat swelling, SOB or lightheadedness with hypotension: Unknown Has patient had a PCN reaction causing severe rash involving mucus membranes or skin necrosis: Unknown Has patient had a PCN reaction that required hospitalization: Unknown Has patient had a PCN reaction occurring within the last 10 years: Unknown If all of the above answers are "NO", then may proceed with Cephalosporin use.     REVIEW OF SYSTEMS (Negative unless checked)  Constitutional: [] Weight loss  [] Fever  [] Chills Cardiac: [] Chest pain   [] Chest pressure   [] Palpitations   [] Shortness of breath when laying flat   [] Shortness of breath with exertion. Vascular:  [] Pain in legs with walking   [x] Pain in legs at rest  [] History of DVT   [] Phlebitis   [x] Swelling in legs   [] Varicose veins   [] Non-healing ulcers Pulmonary:   [] Uses home oxygen    [] Productive cough   [] Hemoptysis   [] Wheeze  [] COPD   [] Asthma Neurologic:  [] Dizziness   [] Seizures   [] History of stroke   [] History of TIA  [] Aphasia   [] Vissual changes   [] Weakness or numbness in arm   [] Weakness or numbness in leg Musculoskeletal:   [] Joint swelling   [] Joint pain   [] Low back pain Hematologic:  [] Easy bruising  [] Easy bleeding   [] Hypercoagulable state   [] Anemic Gastrointestinal:  []   Diarrhea   [] Vomiting  [] Gastroesophageal reflux/heartburn   [] Difficulty swallowing. Genitourinary:  [] Chronic kidney disease   [] Difficult urination  [] Frequent urination   [] Blood in urine Skin:  [] Rashes   [] Ulcers  Psychological:  [] History of anxiety   []  History of major depression.  Physical Examination  Vitals:   11/16/22 1338  BP: 127/74  Pulse: 67  Resp: 16   There is no height or weight on file to calculate BMI. Gen: WD/WN, NAD Head: /AT, No temporalis wasting.  Ear/Nose/Throat: Hearing grossly intact, nares w/o erythema or drainage, pinna without lesions Eyes: PER, EOMI, sclera nonicteric.  Neck: Supple, no gross masses.  No JVD.  Pulmonary:  Good air movement, no audible wheezing, no use of accessory muscles.  Cardiac: RRR, precordium not hyperdynamic. Vascular:  scattered varicosities present bilaterally.  Moderate venous stasis changes to the legs bilaterally. 4+ soft pitting edema. Ulceration of the left ankle noninfected  CEAP [id number]   Vessel Right Left  Radial Palpable Palpable  Gastrointestinal: soft, non-distended. No guarding/no peritoneal signs.  Musculoskeletal: M/S 5/5 throughout.  No deformity.  Neurologic: CN 2-12 intact. Pain and light touch intact in extremities.  Symmetrical.  Speech is fluent. Motor exam as listed above. Psychiatric: Judgment intact, Mood & affect appropriate for pt's clinical situation. Dermatologic: Venous rashes + ulcers noted.  No changes consistent with cellulitis. Lymph : No lichenification or skin changes of chronic  lymphedema.  CBC Lab Results  Component Value Date   WBC 15.0 (H) 05/12/2022   HGB 13.1 05/12/2022   HCT 42.0 05/12/2022   MCV 91.9 05/12/2022   PLT 362 05/12/2022    BMET    Component Value Date/Time   NA 140 08/03/2022 1405   NA 135 08/05/2014 1306   K 4.4 08/03/2022 1405   K 4.5 08/05/2014 1306   CL 102 08/03/2022 1405   CL 102 08/05/2014 1306   CO2 27 08/03/2022 1405   CO2 25 08/05/2014 1306   GLUCOSE 91 08/03/2022 1405   GLUCOSE 107 (H) 05/12/2022 1512   GLUCOSE 136 (H) 08/05/2014 1306   BUN 18 08/03/2022 1405   BUN 23 (H) 08/05/2014 1306   CREATININE 0.70 08/03/2022 1405   CREATININE 0.86 08/05/2014 1306   CALCIUM 8.7 08/03/2022 1405   CALCIUM 8.2 (L) 08/05/2014 1306   GFRNONAA >60 05/12/2022 1512   GFRNONAA >60 08/05/2014 1306   GFRAA 92 04/03/2020 1637   GFRAA >60 08/05/2014 1306   CrCl cannot be calculated (Patient's most recent lab result is older than the maximum 21 days allowed.).  COAG No results found for: "INR", "PROTIME"  Radiology No results found.   Assessment/Plan 1. Venous ulcer of ankle, left (HCC) No surgery or intervention at this point in time.    I have had a long discussion with the patient regarding venous insufficiency and why it  causes symptoms, specifically venous ulceration. I have discussed with the patient the chronic skin changes that accompany venous insufficiency and the long term sequela such as infection and recurring  ulceration.  Patient will be placed in [employer] which will be changed weekly drainage permitting.  In addition, behavioral modification including several periods of elevation of the lower extremities during the day will be continued. Achieving a position with the ankles at heart level was stressed to the patient  The patient is instructed to begin routine exercise, especially walking on a daily basis  In the future the patient can be assessed for graduated compression stockings or wraps as well as  a Lymph  Pump once the ulcers are healed.  2. Chronic venous insufficiency No surgery or intervention at this point in time.    I have had a long discussion with the patient regarding venous insufficiency and why it  causes symptoms, specifically venous ulceration. I have discussed with the patient the chronic skin changes that accompany venous insufficiency and the long term sequela such as infection and recurring  ulceration.  Patient will be placed in [employer] which will be changed weekly drainage permitting.  In addition, behavioral modification including several periods of elevation of the lower extremities during the day will be continued. Achieving a position with the ankles at heart level was stressed to the patient  The patient is instructed to begin routine exercise, especially walking on a daily basis  In the future the patient can be assessed for graduated compression stockings or wraps as well as a Lymph Pump once the ulcers are healed.  3. Essential hypertension Continue antihypertensive medications as already ordered, these medications have been reviewed and there are no changes at this time.  4. Controlled type 2 diabetes mellitus with diabetic polyneuropathy, without long-term current use of insulin (HCC) Continue hypoglycemic medications as already ordered, these medications have been reviewed and there are no changes at this time.  Hgb A1C to be monitored as already arranged by primary service  5. Hyperlipidemia, unspecified hyperlipidemia type Continue statin as ordered and reviewed, no changes at this time    Levora Dredge, MD  11/16/2022 1:54 PM

## 2022-11-23 ENCOUNTER — Ambulatory Visit: Payer: Medicaid Other | Admitting: Pulmonary Disease

## 2022-11-24 ENCOUNTER — Encounter (INDEPENDENT_AMBULATORY_CARE_PROVIDER_SITE_OTHER): Payer: Medicaid Other

## 2022-11-24 DIAGNOSIS — G4736 Sleep related hypoventilation in conditions classified elsewhere: Secondary | ICD-10-CM | POA: Diagnosis not present

## 2022-11-24 DIAGNOSIS — J449 Chronic obstructive pulmonary disease, unspecified: Secondary | ICD-10-CM | POA: Diagnosis not present

## 2022-11-26 DIAGNOSIS — Z419 Encounter for procedure for purposes other than remedying health state, unspecified: Secondary | ICD-10-CM | POA: Diagnosis not present

## 2022-11-30 ENCOUNTER — Encounter (INDEPENDENT_AMBULATORY_CARE_PROVIDER_SITE_OTHER): Payer: Medicaid Other

## 2022-12-01 ENCOUNTER — Other Ambulatory Visit: Payer: Self-pay | Admitting: Family Medicine

## 2022-12-01 DIAGNOSIS — Z1231 Encounter for screening mammogram for malignant neoplasm of breast: Secondary | ICD-10-CM

## 2022-12-02 ENCOUNTER — Encounter: Payer: Self-pay | Admitting: Obstetrics and Gynecology

## 2022-12-02 ENCOUNTER — Other Ambulatory Visit: Payer: Medicaid Other | Admitting: Obstetrics and Gynecology

## 2022-12-02 NOTE — Patient Outreach (Signed)
Medicaid Managed Care   Nurse Care Manager Note  12/02/2022 Name:  Cheryl Chandler MRN:  [id number] DOB:  [birth year]  Cheryl Chandler is an 57 y.o. year old female who is a primary patient of [name], NP.  [facility] Managed Care Coordination team was consulted for assistance with:    Chronic healthcare management needs, LBP, OSA, GERD, arthritis, HLD, HTN, DM, COPD, anxiety/depression, lymphedema, headache, hypothryroid  Cheryl Chandler was given information about Medicaid Managed Care Coordination team services today. Cheryl Chandler Patient agreed to services and verbal consent obtained.  Engaged with patient by telephone for follow up visit in response to provider referral for case management and/or care coordination services.   Assessments/Interventions:  Review of past medical history, allergies, medications, health status, including review of consultants reports, laboratory and other test data, was performed as part of comprehensive evaluation and provision of chronic care management services.  SDOH (Social Determinants of Health) assessments and interventions performed: [name]    Flowsheet Row Patient Outreach Telephone from 12/02/2022 in [place] POPULATION HEALTH DEPARTMENT Patient Outreach Telephone from 11/03/2022 in [place] POPULATION HEALTH DEPARTMENT Patient Outreach Telephone from 09/18/2022 in [place] POPULATION HEALTH DEPARTMENT Patient Outreach Telephone from 08/24/2022 in [place] POPULATION HEALTH DEPARTMENT Office Visit from 08/03/2022 in [facility] Patient Outreach Telephone from 07/28/2022 in [place] POPULATION HEALTH DEPARTMENT  [name]        Food Insecurity Interventions -- -- Other (Comment)  [has food stamps and resources] -- -- --  Transportation Interventions Intervention Not Indicated -- -- -- -- --  Depression Interventions/Treatment  -- -- -- -- Medication, Currently on Treatment  --  Stress Interventions -- Other (Comment), Provide Counseling, Offered [employer] Resources -- [employer], Provide Counseling  [Recent grief triggers] -- [employer], Provide Counseling  Social Connections Interventions -- -- Intervention Not Indicated -- -- --  Health Literacy Interventions Intervention Not Indicated -- -- -- -- --     Care Plan  Allergies  Allergen Reactions   Codeine Shortness Of Breath and Rash   Percocet [Oxycodone-Acetaminophen] Shortness Of Breath   Sulfa Antibiotics Shortness Of Breath and Rash   Aspirin Hives   Bee Venom     Bee stings   Darvon [Propoxyphene]     Darvocet-N 100   Latex    Oxycodone Nausea And Vomiting   Penicillins     Has patient had a PCN reaction causing immediate rash, facial/tongue/throat swelling, SOB or lightheadedness with hypotension: Unknown Has patient had a PCN reaction causing severe rash involving mucus membranes or skin necrosis: Unknown Has patient had a PCN reaction that required hospitalization: Unknown Has patient had a PCN reaction occurring within the last 10 years: Unknown If all of the above answers are "NO", then may proceed with Cephalosporin use.   Medications Reviewed Today     Reviewed by [name], RN (Registered Nurse) on 12/02/22 at [phone]  Med List Status: <None>   Medication Order Taking? Sig Documenting Provider Last Dose Status Informant  ACCU-CHEK GUIDE test strip [id number] No USE TO CHECK BLOOD SUGAR 3 TIMES DAILY AS DIRECTED [name], NP Taking Active   Accu-Chek Softclix Lancets lancets [id number] No CHECK BLOOD SUGAR TWICE DAILY [name], NP Taking Active   albuterol (PROVENTIL) (2.5 MG/3ML) 0.083% nebulizer solution [id number] No USE 1 VIAL VIA NEBULIZER EVERY 4 HOURS AS NEEDED Mecum, Erin E, PA-C Taking Active   amLODipine (NORVASC) 5  MG tablet [id number] No Take 1 tablet (5 mg total) by mouth daily. Mecum, Oswaldo Conroy, PA-C  Taking Active   atorvastatin (LIPITOR) 80 MG tablet [id number] No Take 1 tablet (80 mg total) by mouth daily. Mecum, Erin E, PA-C Taking Active   Blood Glucose Monitoring Suppl (ACCU-CHEK GUIDE) w/Device KIT [id number] No Use to check blood sugar 3 times a day. Aura Dials T, NP Taking Active   Blood Pressure Monitoring (OMRON 3 SERIES BP MONITOR) DEVI [id number] No Use to check blood pressure as directed [name], NP Taking Active   citalopram (CELEXA) 40 MG tablet [id number] No Take 1 tablet (40 mg total) by mouth daily. Mecum, Erin E, PA-C Taking Active   diphenhydrAMINE (BENADRYL) 25 mg capsule [id number] No Take 25 mg by mouth every 6 (six) hours as needed for itching. Taking once a day [provider] Taking Active Self  Dulaglutide (TRULICITY) 0.75 MG/0.5ML SOPN [id number] No 0.75 mg by ultrasound guided injection route once a week. Mecum, Oswaldo Conroy, PA-C Taking Active   EPINEPHrine 0.3 mg/0.3 mL IJ SOAJ injection [id number] No INJECT 1 SYRINGE INTO OUTER THIGH ONCE AS NEEDED FOR SEVERE ALLERGIC REACTION. [name], NP Taking Active   Fluticasone-Umeclidin-Vilant (TRELEGY ELLIPTA) 100-62.5-25 MCG/ACT AEPB [id number] No Inhale 1 puff into the lungs daily. Salena Saner, MD Taking Active   furosemide (LASIX) 20 MG tablet [id number] No TAKE 1 TABLET BY MOUTH ONCE DAILY IN AFTERNOON AS NEEDED LEG EDEMA [name], NP Taking Active   gabapentin (NEURONTIN) 300 MG capsule [id number] No TAKE 1 CAPSULE BY MOUTH 3 TIMES DAILY ASNEEDED (NEED OFFICE VISIT FOR FURTHER REFILLS) [name], NP Taking Active   hydrALAZINE (APRESOLINE) 50 MG tablet [id number] No Take 1 tablet (50 mg total) by mouth 3 (three) times daily. Mecum, Erin E, PA-C Taking Active   hydrOXYzine (VISTARIL) 25 MG capsule [id number] No Take 1 capsule (25 mg total) by mouth every 8 (eight) hours as needed. Mecum, Oswaldo Conroy, PA-C Taking Active   levothyroxine (SYNTHROID) 50 MCG tablet [id number] No TAKE 1 TABLET  BY MOUTH ONCE DAILY ON AN EMPTY STOMACH. WAIT 30 MINUTES BEFORE TAKING OTHER MEDS. Mecum, Oswaldo Conroy, PA-C Taking Active   lisinopril (ZESTRIL) 40 MG tablet [id number] No TAKE 1 TABLET BY MOUTH ONCE DAILY [name], NP Taking Active   meloxicam (MOBIC) 15 MG tablet [id number] No TAKE 1 TABLET BY MOUTH ONCE DAILY AS NEEDED WITH FOOD OR MILK [name], NP Taking Active            Med Note Littie Deeds, CHERYL A   Fri Aug 07, 2022  9:13 AM) Taking daily  montelukast (SINGULAIR) 10 MG tablet [id number] No TAKE 1 TABLET BY MOUTH AT BEDTIME [name], NP Taking Active   nitroGLYCERIN (NITROSTAT) 0.4 MG SL tablet [id number] No DISSOLVE 1 TABLET UNDER TONGUE AT ONSET OF CHEST PAIN. REPEAT IN 5 MIN IF NOT RESOLVED, MAX 3 DOSES. 911 IF NEEDED. [name], NP Taking Active   omeprazole (PRILOSEC) 20 MG capsule [id number] No TAKE 1 CAPSULE BY MOUTH ONCE DAILY [name], NP Taking Active   OXYGEN [id number] No Inhale 3 L into the lungs daily. [provider] Taking Active   spironolactone (ALDACTONE) 25 MG tablet [id number] No Take 0.5 tablets (12.5 mg total) by mouth daily. [name], NP Taking Active   Vitamin D, Ergocalciferol, (DRISDOL) 1.25 MG (50000 UNIT) CAPS capsule [id number] No TAKE 1 CAPSULE BY MOUTH EVERY 7 DAYS [name], NP Taking Active  Patient Active Problem List   Diagnosis Date Noted   Venous ulcer of ankle, left (HCC) 11/16/2022   Moderate episode of recurrent major depressive disorder (HCC) 08/03/2022   Controlled type 2 diabetes mellitus with diabetic polyneuropathy, without long-term current use of insulin (HCC) 08/03/2022   Venous ulcer (HCC) 05/03/2022   Foot pain, right 09/01/2021   Fibrocystic disease of both breasts 05/07/2019   IFG (impaired fasting glucose) 05/03/2019   Chronic venous insufficiency 11/09/2018   Overactive bladder 09/30/2018   Peripheral neuropathy 09/30/2018   Apnea 01/02/2016   Lymphedema  11/30/2014   Dyspnea on exertion 11/30/2014   Anxiety 11/29/2014   Arthritis 11/29/2014   COPD (chronic obstructive pulmonary disease) (HCC) 11/29/2014   Clinical depression 11/29/2014   H/O eating disorder 11/29/2014   Esophageal reflux 11/29/2014   Acne inversa 11/29/2014   Essential hypertension 11/29/2014   HLD (hyperlipidemia) 11/29/2014   Adult hypothyroidism 11/29/2014   Insomnia 11/29/2014   Low back pain 11/29/2014   Morbid obesity (HCC) 11/29/2014   Posttraumatic stress disorder 11/29/2014   Vitamin D deficiency 11/29/2014   Nicotine dependence, cigarettes, uncomplicated 11/29/2014   Conditions to be addressed/monitored per PCP order:  Chronic healthcare management needs, LBP, OSA, GERD, arthritis, HLD, HTN, DM, COPD, anxiety/depression, lymphedema, headache, hypothryroid  Care Plan : RNCM: COPD (Adult)  Updates made by [name], RN since 12/02/2022 12:00 AM     Problem: RNCM: Psychological Adjustment to Diagnosis (COPD)   Priority: Medium  Onset Date: 05/22/2020     Long-Range Goal: RNCM: Adjustment to Disease Achieved   Start Date: 05/22/2020  Expected End Date: 03/04/2023  Recent Progress: On track  Priority: High  Note:   Current Barriers:  Knowledge deficits related to basic understanding of COPD disease process Knowledge deficits related to basic COPD self care/management Knowledge deficit related to basic understanding of how to use inhalers and how inhaled medications work Knowledge deficit related to importance of energy conservation Limited Social Support Unable to independently manage COPD Lacks social connections Does not contact provider office for questions/concerns  12/02/22 patient continues on 3L of Oxygen-uses day and night as needed.  PULM appt 9/9.  To inquire regarding portable tank  Case Manager Clinical Goal(s): patient will report using inhalers as prescribed including rinsing mouth after use patient will be able to verbalize  understanding of COPD action plan and when to seek appropriate levels of medical care  patient will engage in lite exercise as tolerated to build/regain stamina and strength and reduce shortness of breath through activity tolerance  patient will verbalize basic understanding of COPD disease process and self care activities   Interventions:  Inter-disciplinary care team collaboration (see longitudinal plan of care) UNABLE to independently: manage COPD Provided patient with basic written and verbal COPD education on self care/management/and exacerbation prevention Provided patient with COPD action plan and reinforced importance of daily self assessment.  Provided written and verbal instructions on pursed lip breathing and utilized returned demonstration as teach back Provided instruction about proper use of medications used for management of COPD including inhalers Advised patient to self assesses COPD action plan zone and make appointment with provider if in the yellow zone for 48 hours without improvement. Provided patient with education about the role of exercise in the management of COPD Advised patient to engage in light exercise as tolerated 3-5 days a week Provided education about and advised patient to utilize infection prevention strategies to reduce risk of respiratory infection.  Patient Goals/Self-Care Activities:  -  decision-making supported - depression screen reviewed - emotional support provided - family involvement promoted - problem-solving facilitated - relaxation techniques promoted - verbalization of feelings encouraged  Follow Up Plan: Telephone follow up appointment with care management team member scheduled.     Care Plan : RNCM: Hypertension (Adult)  Updates made by [name], RN since 12/02/2022 12:00 AM     Problem: RNCM: Hypertension (Hypertension)   Priority: Medium  Onset Date: 05/22/2020     Long-Range Goal: RNCM: Hypertension Monitored   Start  Date: 05/22/2020  Expected End Date: 03/04/2023  Recent Progress: On track  Priority: Medium  Note:    Current Barriers:  Knowledge Deficits related to basic understanding of hypertension pathophysiology and self care management Knowledge Deficits related to understanding of medications prescribed for management of hypertension Limited Social Support Unable to independently manage HTN Lacks social connections Does not contact provider office for questions/concern 12/02/22:  BP stable-127/74 at [facility] appt 7/22  Case Manager Clinical Goal(s):   patient will verbalize understanding of plan for hypertension management patient will demonstrate improved adherence to prescribed treatment plan for hypertension as evidenced by taking all medications as prescribed, monitoring and recording blood pressure as directed, adhering to low sodium/DASH diet patient will demonstrate improved health management independence as evidenced by checking blood pressure as directed and notifying PCP if SBP>160 or DBP > 90, taking all medications as prescribe, and adhering to a low sodium diet as discussed.  Interventions:  Inter-disciplinary care team collaboration (see longitudinal plan of care) Evaluation of current treatment plan related to hypertension self management and patient's adherence to plan as established by provider.  Provided education to patient re: stroke prevention, s/s of heart attack and stroke, DASH diet, complications of uncontrolled blood pressure Reviewed medications with patient and discussed importance of compliance.  Discussed plans with patient for ongoing care management follow up and provided patient with direct contact information for care management team Advised patient, providing education and rationale, to monitor blood pressure daily and record, calling PCP for findings outside established parameters.   Patient Goals/Self-Care Activities Over the next 120 days, patient will:  - Self  administers medications as prescribed Attends all scheduled provider appointments Calls provider office for new concerns, questions, or BP outside discussed parameters Checks BP and records as discussed Follows a low sodium diet/DASH diet - blood pressure trends reviewed - depression screen reviewed - home or ambulatory blood pressure monitoring encouraged  Follow Up Plan: Telephone follow up appointment with care management team member scheduled.  02/01/23 at 0900   Follow Up:  Patient agrees to Care Plan and Follow-up.  Plan: The Managed Medicaid care management team will reach out to the patient again over the next 60 business  days. and The  Patient has been provided with contact information for the Managed Medicaid care management team and has been advised to call with any health related questions or concerns.  Date/time of next scheduled RN care management/care coordination outreach:  02/01/23 at 0900

## 2022-12-02 NOTE — Patient Instructions (Signed)
Visit Information  Ms. Grief was given information about Medicaid Managed Care team care coordination services as a part of their [facility] Medicaid benefit. Teletha Mikeia Loftis verbally consented to engagement with the [facility] Managed Care team.   If you are experiencing a medical emergency, please call 911 or report to your local emergency department or urgent care.   If you have a non-emergency medical problem during routine business hours, please contact your provider's office and ask to speak with a nurse.   For questions related to your [facility] health plan, please call: [phone] or go here:[email or web address]  If you would like to schedule transportation through your [facility] plan, please call the following number at least 2 days in advance of your appointment: [phone].   You can also use the MTM portal or MTM mobile app to manage your rides. Reimbursement for transportation is available through [facility]! For the portal, please go to [email or web address]  Call the [facility] Crisis Line at [phone], at any time, 24 hours a day, 7 days a week. If you are in danger or need immediate medical attention call 911.  If you would like help to quit smoking, call 1-800-QUIT-NOW (801-635-7879) OR Espaol: 1-855-Djelo-Ya (4-132-440-1027) o para ms informacin haga clic aqu or Text READY to 253-664 to register via text  Ms. Kober - following are the goals we discussed in your visit today:   Goals Addressed             This Visit's Progress    RNCM: Keep Skin Clean and Dry       Follow Up Date: 02/01/23  - clean and dry skin well - keep skin dry - use a fragrance-free lotion on skin - wear a protective pad or garment    Why is this important?   Leaking urine (pee) can cause soreness from skin rashes and redness.  This is caused by skin being exposed to urine (pee).  12/02/22:  skin improved-rash on legs     RNCM: Track and Manage  My Symptoms-Depression       Timeframe:  Long-Range Goal Priority:  High Start Date:    12-02-2020                         Expected End Date:   ongoing                Follow Up Date: 02/01/23   - avoid negative self-talk - develop a personal safety plan - develop a plan to deal with triggers like holidays, anniversaries - exercise at least 2 to 3 times per week - have a plan for how to handle bad days - journal feelings and what helps to feel better or worse - spend time or talk with others at least 2 to 3 times per week - spend time or talk with others every day - watch for early signs of feeling worse    Why is this important?   Keeping track of your progress will help your treatment team find the right mix of medicine and therapy for you.  Write in your journal every day.  Day-to-day changes in depression symptoms are normal. It may be more helpful to check your progress at the end of each week instead of every day.   12/02/22:  declines any services at this time     RNCM: Track and Manage My Triggers-COPD       Timeframe:  Long-Range Goal Priority:  Medium  Start Date:     05-22-2020                        Expected End Date:   ongoing          Follow Up Date: 02/01/23   - avoid second hand smoke - eliminate smoking in my home - identify and avoid work-related triggers - identify and remove indoor air pollutants - limit outdoor activity during cold weather - listen for public air quality announcements every day    Why is this important?   Triggers are activities or things, like tobacco smoke or cold weather, that make your COPD (chronic obstructive pulmonary disease) flare-up.  Knowing these triggers helps you plan how to stay away from them.  When you cannot remove them, you can learn how to manage them.    12/02/22:  3L as needed   The patient verbalized understanding of instructions, educational materials, and care plan provided today and DECLINED offer to receive copy of  patient instructions, educational materials, and care plan.   The Managed Medicaid care management team will reach out to the patient again over the next 60 business  days.  The  Patient  has been provided with contact information for the Managed Medicaid care management team and has been advised to call with any health related questions or concerns.   Kathi Der RN, BSN [place]  [facility] Care Management Coordinator - Managed Medicaid High Risk [phone]   Following is a copy of your plan of care:  Care Plan : RNCM: COPD (Adult)  Updates made by [name], RN since 12/02/2022 12:00 AM     Problem: RNCM: Psychological Adjustment to Diagnosis (COPD)   Priority: Medium  Onset Date: 05/22/2020     Long-Range Goal: RNCM: Adjustment to Disease Achieved   Start Date: 05/22/2020  Expected End Date: 03/04/2023  Recent Progress: On track  Priority: High  Note:   Current Barriers:  Knowledge deficits related to basic understanding of COPD disease process Knowledge deficits related to basic COPD self care/management Knowledge deficit related to basic understanding of how to use inhalers and how inhaled medications work Knowledge deficit related to importance of energy conservation Limited Social Support Unable to independently manage COPD Lacks social connections Does not contact provider office for questions/concerns  12/02/22 patient continues on 3L of Oxygen-uses day and night as needed.  PULM appt 9/9.  To inquire regarding portable tank  Case Manager Clinical Goal(s): patient will report using inhalers as prescribed including rinsing mouth after use patient will be able to verbalize understanding of COPD action plan and when to seek appropriate levels of medical care  patient will engage in lite exercise as tolerated to build/regain stamina and strength and reduce shortness of breath through activity tolerance  patient will verbalize basic understanding of  COPD disease process and self care activities   Interventions:  Inter-disciplinary care team collaboration (see longitudinal plan of care) UNABLE to independently: manage COPD Provided patient with basic written and verbal COPD education on self care/management/and exacerbation prevention Provided patient with COPD action plan and reinforced importance of daily self assessment.  Provided written and verbal instructions on pursed lip breathing and utilized returned demonstration as teach back Provided instruction about proper use of medications used for management of COPD including inhalers Advised patient to self assesses COPD action plan zone and make appointment with provider if in the yellow zone for 48 hours without improvement.  Provided patient with education about the role of exercise in the management of COPD Advised patient to engage in light exercise as tolerated 3-5 days a week Provided education about and advised patient to utilize infection prevention strategies to reduce risk of respiratory infection.  Patient Goals/Self-Care Activities:  - decision-making supported - depression screen reviewed - emotional support provided - family involvement promoted - problem-solving facilitated - relaxation techniques promoted - verbalization of feelings encouraged  Follow Up Plan: Telephone follow up appointment with care management team member scheduled.   Care Plan : RNCM: Hypertension (Adult)  Updates made by [name], RN since 12/02/2022 12:00 AM     Problem: RNCM: Hypertension (Hypertension)   Priority: Medium  Onset Date: 05/22/2020     Long-Range Goal: RNCM: Hypertension Monitored   Start Date: 05/22/2020  Expected End Date: 03/04/2023  Recent Progress: On track  Priority: Medium  Note:    Current Barriers:  Knowledge Deficits related to basic understanding of hypertension pathophysiology and self care management Knowledge Deficits related to understanding of  medications prescribed for management of hypertension Limited Social Support Unable to independently manage HTN Lacks social connections Does not contact provider office for questions/concern 12/02/22:  BP stable-127/74 at [facility] appt 7/22  Case Manager Clinical Goal(s):   patient will verbalize understanding of plan for hypertension management patient will demonstrate improved adherence to prescribed treatment plan for hypertension as evidenced by taking all medications as prescribed, monitoring and recording blood pressure as directed, adhering to low sodium/DASH diet patient will demonstrate improved health management independence as evidenced by checking blood pressure as directed and notifying PCP if SBP>160 or DBP > 90, taking all medications as prescribe, and adhering to a low sodium diet as discussed.  Interventions:  Inter-disciplinary care team collaboration (see longitudinal plan of care) Evaluation of current treatment plan related to hypertension self management and patient's adherence to plan as established by provider.  Provided education to patient re: stroke prevention, s/s of heart attack and stroke, DASH diet, complications of uncontrolled blood pressure Reviewed medications with patient and discussed importance of compliance.  Discussed plans with patient for ongoing care management follow up and provided patient with direct contact information for care management team Advised patient, providing education and rationale, to monitor blood pressure daily and record, calling PCP for findings outside established parameters.   Patient Goals/Self-Care Activities Over the next 120 days, patient will:  - Self administers medications as prescribed Attends all scheduled provider appointments Calls provider office for new concerns, questions, or BP outside discussed parameters Checks BP and records as discussed Follows a low sodium diet/DASH diet - blood pressure trends reviewed -  depression screen reviewed - home or ambulatory blood pressure monitoring encouraged  Follow Up Plan: Telephone follow up appointment with care management team member scheduled.

## 2022-12-04 ENCOUNTER — Telehealth: Payer: Self-pay | Admitting: Pulmonary Disease

## 2022-12-04 NOTE — Telephone Encounter (Signed)
Dr. Jayme Cloud you saw Cheryl Chandler on 08/10/22 and order HST to be done.  Because the patient was having so many issues we sent the HST order to [facility] so they could mail the machine to her as a Agricultural engineer for the patient. She was having issues keeping her appts. We received a note from [facility] 12/04/22 stating the patient has not returned their calls to schedule the sleep study. They will not make further attempts to schedule the patient

## 2022-12-04 NOTE — Telephone Encounter (Signed)
Maybe it can be re addressed and next office visit

## 2022-12-04 NOTE — Telephone Encounter (Signed)
Noted  

## 2022-12-05 DIAGNOSIS — Z993 Dependence on wheelchair: Secondary | ICD-10-CM | POA: Diagnosis not present

## 2022-12-05 DIAGNOSIS — R3981 Functional urinary incontinence: Secondary | ICD-10-CM | POA: Diagnosis not present

## 2022-12-08 ENCOUNTER — Encounter (INDEPENDENT_AMBULATORY_CARE_PROVIDER_SITE_OTHER): Payer: Medicaid Other

## 2022-12-14 ENCOUNTER — Encounter (INDEPENDENT_AMBULATORY_CARE_PROVIDER_SITE_OTHER): Payer: Medicaid Other | Admitting: Vascular Surgery

## 2022-12-17 ENCOUNTER — Other Ambulatory Visit: Payer: Self-pay

## 2022-12-17 MED ORDER — EPINEPHRINE 0.3 MG/0.3ML IJ SOAJ: INTRAMUSCULAR | 3 refills | Status: DC

## 2022-12-17 NOTE — Telephone Encounter (Signed)
Patient states her husband was recently stung by yellow jackets and is requesting a refill on her epipen as well. States hers is out of date.

## 2022-12-17 NOTE — Progress Notes (Signed)
Cardiology Office Note  Date:  12/18/2022   ID:  Cheryl Chandler, DOB [birth year], MRN [id number]  PCP:  [name], NP   Chief Complaint  Patient presents with   New Patient (Initial Visit)    Ref by [name], NP to establish care; former Dr. Welton Flakes patient. Patient c/o bilateral LE edema, tiredness, shortness of breath & chest pain with little to no activity. Medications reviewed by the patient verbally.     HPI:  Cheryl Chandler is a 57 year old woman with past medical history of Morbid obesity Type 2 diabetes Hypertension COPD, smoker Lymphedema Chronic venous insufficiency Chronic pain/sleeps in recliner/"bad spine" Who presents by referral from [name] for hypertension  Presents today in wheelchair Prior history reviewed  Long history of sleep disorder Reports having sleep study positive, home study Scheduled for home study, titration Tired all the time On oxygen day and night.  Did not wear to her office visit today  History of lymphedema, uses lymphedema pumps twice a day Chronic wound, followed by Dr. Lorretta Harp Can't handle leg wraps Hx of cellulitis  Normal ABI 8/23  Near syncope 4/23, leg pain  Husband with CVA, she helps to take care of him  Home blood pressures: Up and down from 120 systolic up to 180 systolic No regular walking program, she is on disability Previously referred to hypertension clinic in [place], unable to get transportation  Denies significant chest pain concerning for angina  Echo 3/24  1. Left ventricular ejection fraction, by estimation, is 55 to 60%. The  left ventricle has normal function. The left ventricle has no regional  wall motion abnormalities. There is mild left ventricular hypertrophy.  Left ventricular diastolic parameters  are consistent with Grade I diastolic dysfunction (impaired relaxation).   2. Right ventricular systolic function is normal. The right ventricular  size is normal.  There is normal pulmonary artery systolic pressure. The  estimated right ventricular systolic pressure is 15.4 mmHg.   3. The mitral valve is normal in structure. Mild mitral valve  regurgitation. No evidence of mitral stenosis.   4. The aortic valve was not well visualized. Aortic valve regurgitation  is not visualized. No aortic stenosis is present.   5. The inferior vena cava is normal in size with greater than 50%  respiratory variability, suggesting right atrial pressure of 3 mmHg.    EKG personally reviewed by myself on todays visit EKG Interpretation Date/Time:  Friday December 18 2022 09:50:49 EDT Ventricular Rate:  63 PR Interval:  174 QRS Duration:  92 QT Interval:  458 QTC Calculation: 468 R Axis:   12  Text Interpretation: Normal sinus rhythm Normal ECG When compared with ECG of 28-Jul-2021 18:27, Nonspecific T wave abnormality no longer evident in Lateral leads Confirmed by Julien Nordmann [id number]) on 12/18/2022 10:06:09 AM    PMH:   has a past medical history of Arthritis, Asthma, COPD (chronic obstructive pulmonary disease) (HCC), Hypertension, Neuropathy, Pericarditis, Personal history of urinary calculi (11/29/2014), and Thyroid disease.  PSH:    Past Surgical History:  Procedure Laterality Date   APPENDECTOMY     arm surgery Left    pins   fatty tumor removed from back     JOINT REPLACEMENT Right    LEG SURGERY Bilateral    OVARIAN CYST SURGERY Right    took piece of right ovary due to cyst   TOTAL HIP ARTHROPLASTY Right    wisdom teeth removed      Current Outpatient Medications  Medication  Sig Dispense Refill   ACCU-CHEK GUIDE test strip USE TO CHECK BLOOD SUGAR 3 TIMES DAILY AS DIRECTED 300 each 2   Accu-Chek Softclix Lancets lancets CHECK BLOOD SUGAR TWICE DAILY 100 each 1   albuterol (PROVENTIL) (2.5 MG/3ML) 0.083% nebulizer solution USE 1 VIAL VIA NEBULIZER EVERY 4 HOURS AS NEEDED 225 mL 0   amLODipine (NORVASC) 5 MG tablet Take 1 tablet (5 mg total) by  mouth daily. 90 tablet 1   atorvastatin (LIPITOR) 80 MG tablet Take 1 tablet (80 mg total) by mouth daily. 90 tablet 3   Blood Glucose Monitoring Suppl (ACCU-CHEK GUIDE) w/Device KIT Use to check blood sugar 3 times a day. 1 kit 1   Blood Pressure Monitoring (OMRON 3 SERIES BP MONITOR) DEVI Use to check blood pressure as directed 1 each 0   citalopram (CELEXA) 40 MG tablet Take 1 tablet (40 mg total) by mouth daily. 90 tablet 0   diphenhydrAMINE (BENADRYL) 25 mg capsule Take 25 mg by mouth every 6 (six) hours as needed for itching. Taking once a day     Dulaglutide (TRULICITY) 0.75 MG/0.5ML SOPN 0.75 mg by ultrasound guided injection route once a week. 6 mL 1   EPINEPHrine 0.3 mg/0.3 mL IJ SOAJ injection INJECT 1 SYRINGE INTO OUTER THIGH ONCE AS NEEDED FOR SEVERE ALLERGIC REACTION. 2 each 3   Fluticasone-Umeclidin-Vilant (TRELEGY ELLIPTA) 100-62.5-25 MCG/ACT AEPB Inhale 1 puff into the lungs daily. 28 each 11   furosemide (LASIX) 20 MG tablet TAKE 1 TABLET BY MOUTH ONCE DAILY IN AFTERNOON AS NEEDED LEG EDEMA 90 tablet 0   gabapentin (NEURONTIN) 300 MG capsule TAKE 1 CAPSULE BY MOUTH 3 TIMES DAILY ASNEEDED (NEED OFFICE VISIT FOR FURTHER REFILLS) 270 capsule 0   hydrALAZINE (APRESOLINE) 50 MG tablet Take 1 tablet (50 mg total) by mouth 3 (three) times daily. 270 tablet 1   hydrOXYzine (VISTARIL) 25 MG capsule Take 1 capsule (25 mg total) by mouth every 8 (eight) hours as needed. 270 capsule 1   levothyroxine (SYNTHROID) 50 MCG tablet TAKE 1 TABLET BY MOUTH ONCE DAILY ON AN EMPTY STOMACH. WAIT 30 MINUTES BEFORE TAKING OTHER MEDS. 90 tablet 0   lisinopril (ZESTRIL) 40 MG tablet TAKE 1 TABLET BY MOUTH ONCE DAILY 90 tablet 0   meloxicam (MOBIC) 15 MG tablet TAKE 1 TABLET BY MOUTH ONCE DAILY AS NEEDED WITH FOOD OR MILK 90 tablet 3   montelukast (SINGULAIR) 10 MG tablet TAKE 1 TABLET BY MOUTH AT BEDTIME 90 tablet 3   nitroGLYCERIN (NITROSTAT) 0.4 MG SL tablet DISSOLVE 1 TABLET UNDER TONGUE AT ONSET OF CHEST  PAIN. REPEAT IN 5 MIN IF NOT RESOLVED, MAX 3 DOSES. 911 IF NEEDED. 25 tablet 1   omeprazole (PRILOSEC) 20 MG capsule TAKE 1 CAPSULE BY MOUTH ONCE DAILY 30 capsule 12   OXYGEN Inhale 3 L into the lungs daily.     spironolactone (ALDACTONE) 25 MG tablet Take 0.5 tablets (12.5 mg total) by mouth daily. 45 tablet 1   Vitamin D, Ergocalciferol, (DRISDOL) 1.25 MG (50000 UNIT) CAPS capsule TAKE 1 CAPSULE BY MOUTH EVERY 7 DAYS 12 capsule 1   No current facility-administered medications for this visit.     Allergies:   Codeine, Percocet [oxycodone-acetaminophen], Sulfa antibiotics, Aspirin, Bee venom, Darvon [propoxyphene], Latex, Oxycodone, and Penicillins   Social History:  The patient  reports that she has been smoking cigarettes. She started smoking about 30 years ago. She has a 90 pack-year smoking history. She has never used smokeless tobacco. She reports  that she does not drink alcohol and does not use drugs.   Family History:   family history includes Heart disease in her mother; Hyperlipidemia in her mother; Hypertension in her mother; Seizures in her sister.    Review of Systems: Review of Systems  Constitutional: Negative.   HENT: Negative.    Respiratory: Negative.    Cardiovascular: Negative.   Gastrointestinal: Negative.   Musculoskeletal: Negative.   Neurological: Negative.   Psychiatric/Behavioral: Negative.    All other systems reviewed and are negative.   PHYSICAL EXAM: VS:  BP (!) 149/66 (BP Location: Right Wrist, Patient Position: Sitting, Cuff Size: Normal)   Pulse 63   Ht 5\' 2"  (1.575 m)   Wt (!) 326 lb (147.9 kg)   LMP 06/09/2017 (Approximate)   SpO2 96%   BMI 59.63 kg/m  , BMI Body mass index is 59.63 kg/m. GEN: Well nourished, well developed, in no acute distress HEENT: normal Neck: no JVD, carotid bruits, or masses Cardiac: RRR; no murmurs, rubs, or gallops,no edema  Respiratory:  clear to auscultation bilaterally, normal work of breathing GI: soft,  nontender, nondistended, + BS MS: no deformity or atrophy Skin: warm and dry, no rash Neuro:  Strength and sensation are intact Psych: euthymic mood, full affect   Recent Labs: 05/12/2022: Hemoglobin 13.1; Platelets 362 08/03/2022: ALT 19; BUN 18; Creatinine, Ser 0.70; Potassium 4.4; Sodium 140; TSH 2.760    Lipid Panel Lab Results  Component Value Date   CHOL 110 08/03/2022   HDL 39 (L) 08/03/2022   LDLCALC 55 08/03/2022   TRIG 77 08/03/2022      Wt Readings from Last 3 Encounters:  12/18/22 (!) 326 lb (147.9 kg)  05/12/22 280 lb (127 kg)  05/04/22 280 lb (127 kg)     ASSESSMENT AND PLAN:  Problem List Items Addressed This Visit       Cardiology Problems   Essential hypertension - Primary   Relevant Orders   EKG 12-Lead (Completed)   HLD (hyperlipidemia)   Chronic venous insufficiency   Relevant Orders   EKG 12-Lead (Completed)     Other   Venous ulcer of ankle, left (HCC)   COPD (chronic obstructive pulmonary disease) (HCC)   Relevant Orders   EKG 12-Lead (Completed)   Controlled type 2 diabetes mellitus with diabetic polyneuropathy, without long-term current use of insulin (HCC)   Relevant Orders   EKG 12-Lead (Completed)   Anxiety   Morbid obesity (HCC)   Essential hypertension Recommend she stop amlodipine given her leg swelling In its place we will start Cardura/doxazosin 1 mg twice daily Continue her other medications  Lymphedema Exacerbated by morbid obesity Recommend she continue calorie restriction, Lasix up to 20 twice daily, continue spironolactone Compression pumps twice a day, followed by vascular  Morbid obesity Low carbohydrate diet recommended On Trulicity  Diabetes type 2 Managed by primary care A1c 5.9  Hyperlipidemia Continue Lipitor  Smoker We have encouraged her to continue to work on weaning her cigarettes and smoking cessation. She will continue to work on this and does not want any assistance with chantix.    Risk  stratification Smoking cessation recommended Diabetes well-controlled, cholesterol well-controlled Denies anginal symptoms Given body habitus, not a good candidate for Myoview or cardiac CTA    Total encounter time more than 60 minutes  Greater than 50% was spent in counseling and coordination of care with the patient    Signed, Dossie Arbour, M.D., Ph.D. [facility] [place], [place] [phone]

## 2022-12-18 ENCOUNTER — Ambulatory Visit: Payer: Medicaid Other | Attending: Cardiology | Admitting: Cardiovascular Disease

## 2022-12-18 ENCOUNTER — Encounter: Payer: Self-pay | Admitting: Cardiovascular Disease

## 2022-12-18 VITALS — BP 149/66 | HR 63 | Ht 62.0 in | Wt 326.0 lb

## 2022-12-18 DIAGNOSIS — J449 Chronic obstructive pulmonary disease, unspecified: Secondary | ICD-10-CM | POA: Diagnosis not present

## 2022-12-18 DIAGNOSIS — I10 Essential (primary) hypertension: Secondary | ICD-10-CM

## 2022-12-18 DIAGNOSIS — I83023 Varicose veins of left lower extremity with ulcer of ankle: Secondary | ICD-10-CM | POA: Diagnosis not present

## 2022-12-18 DIAGNOSIS — I872 Venous insufficiency (chronic) (peripheral): Secondary | ICD-10-CM

## 2022-12-18 DIAGNOSIS — L97329 Non-pressure chronic ulcer of left ankle with unspecified severity: Secondary | ICD-10-CM

## 2022-12-18 DIAGNOSIS — E1142 Type 2 diabetes mellitus with diabetic polyneuropathy: Secondary | ICD-10-CM

## 2022-12-18 DIAGNOSIS — E782 Mixed hyperlipidemia: Secondary | ICD-10-CM

## 2022-12-18 DIAGNOSIS — F419 Anxiety disorder, unspecified: Secondary | ICD-10-CM | POA: Diagnosis not present

## 2022-12-18 MED ORDER — FUROSEMIDE 20 MG PO TABS: 20.0000 mg | ORAL_TABLET | Freq: Two times a day (BID) | ORAL | 3 refills | Status: DC

## 2022-12-18 MED ORDER — DOXAZOSIN MESYLATE 1 MG PO TABS: 1.0000 mg | ORAL_TABLET | Freq: Two times a day (BID) | ORAL | 3 refills | Status: DC

## 2022-12-18 NOTE — Patient Instructions (Addendum)
Medication Instructions:  Stop amlodipine Start cardura/doxazosin 1 mg twice a day  Please increase lasix up to 20 mg twice a day  If you need a refill on your cardiac medications before your next appointment, please call your pharmacy.   Lab work: No new labs needed  Testing/Procedures: No new testing needed  Follow-Up: At [facility] you and your health needs are our priority.  As part of our continuing mission to provide you with exceptional heart care, we have created designated Provider Care Teams.  These Care Teams include your primary Cardiologist (physician) and Advanced Practice Providers (APPs -  Physician Assistants and Nurse Practitioners) who all work together to provide you with the care you need, when you need it.  You will need a follow up appointment as needed  Providers on your designated Care Team:   Nicolasa Ducking, NP Eula Listen, PA-C Cadence Fransico [place], [place]  COVID-19 Vaccine Information can be found at: [email or web address] For questions related to vaccine distribution or appointments, please email [email or web address] or call [phone].

## 2022-12-21 ENCOUNTER — Telehealth: Payer: Self-pay | Admitting: Cardiovascular Disease

## 2022-12-21 NOTE — Telephone Encounter (Signed)
  Pt c/o medication issue:  1. Name of Medication: amlodipine   2. How are you currently taking this medication (dosage and times per day)?   3. Are you having a reaction (difficulty breathing--STAT)?   4. What is your medication issue? Pt is calling and would like to know if she suppose to take this medication

## 2022-12-21 NOTE — Telephone Encounter (Signed)
Called patient, advised of current medication list- per last visit. She just wanted to clarify what medication she should stop taking and start taking.   All questions/concerns answered.   Thanks!

## 2022-12-25 DIAGNOSIS — J449 Chronic obstructive pulmonary disease, unspecified: Secondary | ICD-10-CM | POA: Diagnosis not present

## 2022-12-25 DIAGNOSIS — G4736 Sleep related hypoventilation in conditions classified elsewhere: Secondary | ICD-10-CM | POA: Diagnosis not present

## 2022-12-27 DIAGNOSIS — Z419 Encounter for procedure for purposes other than remedying health state, unspecified: Secondary | ICD-10-CM | POA: Diagnosis not present

## 2023-01-04 ENCOUNTER — Ambulatory Visit: Payer: Medicaid Other | Admitting: Pulmonary Disease

## 2023-01-04 ENCOUNTER — Ambulatory Visit (INDEPENDENT_AMBULATORY_CARE_PROVIDER_SITE_OTHER): Payer: Medicaid Other | Admitting: Vascular Surgery

## 2023-01-04 DIAGNOSIS — R3981 Functional urinary incontinence: Secondary | ICD-10-CM | POA: Diagnosis not present

## 2023-01-04 DIAGNOSIS — Z993 Dependence on wheelchair: Secondary | ICD-10-CM | POA: Diagnosis not present

## 2023-01-04 DIAGNOSIS — N3281 Overactive bladder: Secondary | ICD-10-CM | POA: Diagnosis not present

## 2023-01-11 ENCOUNTER — Ambulatory Visit (INDEPENDENT_AMBULATORY_CARE_PROVIDER_SITE_OTHER): Payer: Medicaid Other | Admitting: Vascular Surgery

## 2023-01-21 ENCOUNTER — Other Ambulatory Visit: Payer: Medicaid Other | Admitting: Licensed Clinical Social Worker

## 2023-01-21 NOTE — Patient Instructions (Signed)
Visit Information  Cheryl Chandler was given information about Medicaid Managed Care team care coordination services as a part of their [facility] Medicaid benefit. Cheryl Chandler verbally consented to engagement with the [facility] Managed Care team.   If you are experiencing a medical emergency, please call 911 or report to your local emergency department or urgent care.   If you have a non-emergency medical problem during routine business hours, please contact your provider's office and ask to speak with a nurse.   For questions related to your [facility] health plan, please call: [phone] or go here:[email or web address]  If you would like to schedule transportation through your [facility] plan, please call the following number at least 2 days in advance of your appointment: [phone].   You can also use the MTM portal or MTM mobile app to manage your rides. Reimbursement for transportation is available through [facility]! For the portal, please go to [email or web address]  Call the [facility] Crisis Line at [phone], at any time, 24 hours a day, 7 days a week. If you are in danger or need immediate medical attention call 911.  If you would like help to quit smoking, call 1-800-QUIT-NOW (2506512169) OR Espaol: 1-855-Djelo-Ya (4-132-440-1027) o para ms informacin haga clic aqu or Text READY to 253-664 to register via text  Following is a copy of your plan of care:  Care Plan : General Social Work (Adult)  Updates made by Gustavus Bryant, LCSW since 01/21/2023 12:00 AM     Problem: Coping Skills (General Plan of Care)      Long-Range Goal: Coping Skills Enhanced   Start Date: 11/07/2020  Expected End Date: 03/07/2022  Recent Progress: On track  Priority: High  Note:   Timeframe:  Long-Range Goal Priority:  High Start Date:       08/26/21                 Expected End Date:  ongoing  Follow up date- 03/23/23 at 11 am   Current Barriers:   Financial constraints Mental Health Concerns  Social Isolation Limited education about mental health support resources that are available to her within the local area* Cognitive Deficits Lacks knowledge of community resource   Clinical Social Work Clinical Goal(s):  Over the next 90 days, client will work with SW to address concerns related to experiencing ongoing symptoms of depression, sadness and grief since the passing of her mother Over the next 90 days, patient will work with LCSW to address needs related to implementing appropriate self-care and depression management.    Managing Loss, Adult People experience loss in many different ways throughout their lives. Events such as moving, changing jobs, and losing friends can create a sense of loss. The loss may be as serious as a major health change, divorce, death of a pet, or death of a loved one. All of these types of loss are likely to create a physical and emotional reaction known as grief. Grief is the result of a major change or an absence of something or someone that you count on. Grief is a normal reaction to loss. A variety of factors can affect your grieving experience, including: The nature of your loss. Your relationship to what or whom you lost. Your understanding of grief and how to manage it. Your support system. How to manage lifestyle changes Keep to your normal routine as much as possible. If you have trouble focusing or doing normal activities, it is acceptable to take some time away  from your normal routine. Spend time with friends and loved ones. Eat a healthy diet, get plenty of sleep, and rest when you feel tired. How to recognize changes  The way that you deal with your grief will affect your ability to function as you normally do. When grieving, you may experience these changes: Numbness, shock, sadness, anxiety, anger, denial, and guilt. Thoughts about death. Unexpected crying. A physical sensation of  emptiness in your stomach. Problems sleeping and eating. Tiredness (fatigue). Loss of interest in normal activities. Dreaming about or imagining seeing the person who died. A need to remember what or whom you lost. Difficulty thinking about anything other than your loss for a period of time. Relief. If you have been expecting the loss for a while, you may feel a sense of relief when it happens. Follow these instructions at home:    Activity Express your feelings in healthy ways, such as: Talking with others about your loss. It may be helpful to find others who have had a similar loss, such as a support group. Writing down your feelings in a journal. Doing physical activities to release stress and emotional energy. Doing creative activities like painting, sculpting, or playing or listening to music. Practicing resilience. This is the ability to recover and adjust after facing challenges. Reading some resources that encourage resilience may help you to learn ways to practice those behaviors. General instructions Be patient with yourself and others. Allow the grieving process to happen, and remember that grieving takes time. It is likely that you may never feel completely done with some grief. You may find a way to move on while still cherishing memories and feelings about your loss. Accepting your loss is a process. It can take months or longer to adjust. Keep all follow-up visits as told by your health care provider. This is important. Where to find support To get support for managing loss: Ask your health care provider for help and recommendations, such as grief counseling or therapy. Think about joining a support group for people who are managing a loss. Where to find more information You can find more information about managing loss from: American Society of Clinical Oncology: www.cancer.net American Psychological Association: DiceTournament.ca Contact a health care provider if: Your  grief is extreme and keeps getting worse. You have ongoing grief that does not improve. Your body shows symptoms of grief, such as illness. You feel depressed, anxious, or lonely. Get help right away if: You have thoughts about hurting yourself or others. If you ever feel like you may hurt yourself or others, or have thoughts about taking your own life, get help right away. You can go to your nearest emergency department or call: Your local emergency services (911 in the [place] A suicide crisis helpline, such as the National Suicide Prevention Lifeline at 989-858-4977. This is open 24 hours a day. Summary Grief is the result of a major change or an absence of someone or something that you count on. Grief is a normal reaction to loss. The depth of grief and the period of recovery depend on the type of loss and your ability to adjust to the change and process your feelings. Processing grief requires patience and a willingness to accept your feelings and talk about your loss with people who are supportive. It is important to find resources that work for you and to realize that people experience grief differently. There is not one grieving process that works for everyone in the same way. Be aware  that when grief becomes extreme, it can lead to more severe issues like isolation, depression, anxiety, or suicidal thoughts. Talk with your health care provider if you have any of these issues. This information is not intended to replace advice given to you by your health care provider. Make sure you discuss any questions you have with your health care provider. Document Revised: 06/17/2018 Document Reviewed: 08/27/2016 Elsevier Patient Education  2020 [employer].  Help for Managing Grief   When you are experiencing grief, many resources and support are available to help you. You are not alone.    Grief Share ([email or web address]):    [employer] of [address] [place], [place]      [employer]  [phone] [address] [place], [place]     [facility] [address] [place], [place]      [facility] [address] [place], [place]  [phone]   [facility] [address] [place], [place] [phone]   [facility] [address] [place], [place]     [phone]   [facility] [address] [place], [place]     [phone]   If a Hospice or Palliative Care team has been involved in the care of your loved one, you may reach out to your contact there or locally, you may reach out to [facility]:    [facility]   [address] [place], [place] [address] [phone] Patient was educated on healthy self-care as she admits she has little motivation to walk lately. PCS recommendation was suggested.   Patient Self Care Activities:  Attends all scheduled provider appointments Calls provider office for new concerns or questions  Please see past updates related to this goal by clicking on the "Past Updates" button in the selected goal   [name], BSW, MSW, [employer] Managed Medicaid LCSW [facility]  [facility] [email or web address] Phone: [phone]

## 2023-01-21 NOTE — Patient Outreach (Addendum)
Medicaid Managed Care Social Work Note  01/21/2023 Name:  Cheryl Chandler MRN:  [id number] DOB:  12/09/1965  Cheryl Chandler is an 57 y.o. year old female who is a primary patient of Cheryl Grooms, NP.  The Medicaid Managed Care Coordination team was consulted for assistance with:  Mental Health Counseling and Resources Grief Counseling  Cheryl Chandler was given information about Medicaid Managed Care Coordination team services today. Cheryl Chandler Patient agreed to services and verbal consent obtained.  Engaged with patient  for by telephone forfollow up visit in response to referral for case management and/or care coordination services.   Assessments/Interventions:  Review of past medical history, allergies, medications, health status, including review of consultants reports, laboratory and other test data, was performed as part of comprehensive evaluation and provision of chronic care management services.  SDOH: (Social Determinant of Health) assessments and interventions performed: [name]    Flowsheet Row Patient Outreach Telephone from 01/21/2023 in [place] POPULATION HEALTH DEPARTMENT Patient Outreach Telephone from 12/02/2022 in [place] POPULATION HEALTH DEPARTMENT Patient Outreach Telephone from 11/03/2022 in [place] POPULATION HEALTH DEPARTMENT Patient Outreach Telephone from 09/18/2022 in [place] POPULATION HEALTH DEPARTMENT Patient Outreach Telephone from 08/24/2022 in [place] POPULATION HEALTH DEPARTMENT Office Visit from 08/03/2022 in [facility]  [name]        Food Insecurity Interventions -- -- -- Other (Comment)  [has food stamps and resources] -- --  Housing Interventions Intervention Not Indicated -- -- -- -- --  Transportation Interventions -- Intervention Not Indicated -- -- -- --  Depression Interventions/Treatment  -- -- -- -- -- Medication, Currently on Treatment  Stress Interventions  Offered [employer], Provide Counseling  [Patient's aunt will be taken off life support today which has triggered her grief] -- Other (Comment), Provide Counseling, Offered [employer] Resources -- [employer], Teacher, music grief triggers] --  Social Connections Interventions -- -- -- Intervention Not Indicated -- --  Health Literacy Interventions -- Intervention Not Indicated -- -- -- --       Advanced Directives Status:  See Care Plan for related entries.  Care Plan                 Allergies  Allergen Reactions   Codeine Shortness Of Breath and Rash   Percocet [Oxycodone-Acetaminophen] Shortness Of Breath   Sulfa Antibiotics Shortness Of Breath and Rash   Aspirin Hives   Bee Venom     Bee stings   Darvon [Propoxyphene]     Darvocet-N 100   Latex    Oxycodone Nausea And Vomiting   Penicillins     Has patient had a PCN reaction causing immediate rash, facial/tongue/throat swelling, SOB or lightheadedness with hypotension: Unknown Has patient had a PCN reaction causing severe rash involving mucus membranes or skin necrosis: Unknown Has patient had a PCN reaction that required hospitalization: Unknown Has patient had a PCN reaction occurring within the last 10 years: Unknown If all of the above answers are "NO", then may proceed with Cephalosporin use.    Medications Reviewed Today   Medications were not reviewed in this encounter     Patient Active Problem List   Diagnosis Date Noted   Venous ulcer of ankle, left (HCC) 11/16/2022   Moderate episode of recurrent major depressive disorder (HCC) 08/03/2022   Controlled type 2 diabetes mellitus with diabetic polyneuropathy, without long-term current use of insulin (HCC) 08/03/2022   Venous ulcer (  HCC) 05/03/2022   Foot pain, right 09/01/2021   Fibrocystic disease of both breasts 05/07/2019   IFG (impaired fasting glucose) 05/03/2019   Chronic venous insufficiency  11/09/2018   Overactive bladder 09/30/2018   Peripheral neuropathy 09/30/2018   Apnea 01/02/2016   Lymphedema 11/30/2014   Dyspnea on exertion 11/30/2014   Anxiety 11/29/2014   Arthritis 11/29/2014   COPD (chronic obstructive pulmonary disease) (HCC) 11/29/2014   Clinical depression 11/29/2014   H/O eating disorder 11/29/2014   Esophageal reflux 11/29/2014   Acne inversa 11/29/2014   Essential hypertension 11/29/2014   HLD (hyperlipidemia) 11/29/2014   Adult hypothyroidism 11/29/2014   Insomnia 11/29/2014   Low back pain 11/29/2014   Morbid obesity (HCC) 11/29/2014   Posttraumatic stress disorder 11/29/2014   Vitamin D deficiency 11/29/2014   Nicotine dependence, cigarettes, uncomplicated 11/29/2014    Conditions to be addressed/monitored per PCP order:  Depression  Care Plan : General Social Work (Adult)  Updates made by Cheryl Bryant, LCSW since 01/21/2023 12:00 AM     Problem: Coping Skills (General Plan of Care)      Long-Range Goal: Coping Skills Enhanced   Start Date: 11/07/2020  Expected End Date: 03/07/2022  Recent Progress: On track  Priority: High  Note:   Timeframe:  Long-Range Goal Priority:  High Start Date:       08/26/21                 Expected End Date:  ongoing  Follow up date- 03/23/23 at 11 am   Current Barriers:  Financial constraints Mental Health Concerns  Social Isolation Limited education about mental health support resources that are available to her within the local area* Cognitive Deficits Lacks knowledge of community resource   Clinical Social Work Clinical Goal(s):  Over the next 90 days, client will work with SW to address concerns related to experiencing ongoing symptoms of depression, sadness and grief since the passing of her mother Over the next 90 days, patient will work with LCSW to address needs related to implementing appropriate self-care and depression management.    Interventions: Patient interviewed and appropriate  assessments performed Provided mental health counseling with regards to patient's loss and ongoing stressors. Patient was very close with her mother who passed away a few years ago and she continues to experience symptoms of grief from this loss. Patient denies wanting LCSW to make a referral for grief counseling through [facility] again but was receptive to coping skill and resource education/information that was provided during session today. Patient reports decorating for the holidays helps her to connect with her mother as they did this together when she was alive.  Provided patient with information about managing depression within her daily life. Patient has ongoing grief symptoms as well. Patient states "I feel worn down." Patient reports that she talks out loud to her mother's picture and this is very helpful to her. Grief support resource education provided again during session today.  Provided reflective listening and implemented appropriate interventions to help support patient and her emotional needs  Discussed plans with patient for ongoing care management follow up and provided patient with direct contact information for [facility] team Advised patient to contact [facility] team with any urgent case management needs. Assisted patient/caregiver with obtaining information about health plan benefits LCSW completed past referral for family to have a wheelchair ramp built at their residence due to ongoing mobility concerns. Ramp has been successfully installed.  Positive reinforcement provided to patient for quitting smoking cigarettes.  Coping skill review education provided for future triggers.  Brief self-care education provided to both patient and spouse. Patient confirms that she continues to go to food pantry for food insecurity. Resource information has been provided.  Patient reports ongoing pain and issues with mobility. Patient shares that her pain resides in her pains and legs. Emotional support and  coping skill education provided. Patient confirms stable transportation arrangements for upcoming appointments next week.  Patient reports that she does not have an aide at this time. Patient reports that her continues to be a positive support for her and someone that she can rely on in the case of emergencies.  Patient was educated on healthy self-care skills and advised her to use these into her daily routine.  Patient reports that she has been elevating her legs daily to elevate pain and swelling. [facility] LCSW 06/25/22 update- Patient reports that she is stable and her emotions/feelings have been more manageable lately but she and her spouse are still struggling financially. Patient still declines wanting a referral for a long term therapist so that she can help manage these financial stressors that trigger her stress. Patient is aware of [facility] resource that she was very close to starting therapy with in 2023. Stress management education provided. Patient is agreeable to a 60 day follow up call. 07/28/22- Patient shares that her depression has lifted recently but she is worried as the anniversary of her moms' passing is coming soon. [facility] LCSW provided reeducation on her available resources. She was educated on healthy self-care and reports that she is able to get outside today to get some Vitamin D and spend time with her nieces, nephews and sister. Positive reinforcement provided. 08/24/22- Patient has been experiencing recent grief stressors and was tearful during social work session. Patient was strongly encouraged to consider grief therapy as her uncle buck and aunt sissy are both in critical care at the hospital. [facility] LCSW provided extensive emotional support and will do a 30 day follow up call due to recent additional stressors. Update- Patient's uncle passed and she is experiencing some grief related to this loss. Emotional support provided. [facility] LCSW encouraged patient to consider reaching out to  [facility] to gain therapy but she reports not needing this support right now. 11/03/22- Patient reports that she has been making a daily effort to eat healthy and attempt to be a little more active. Patient reports that she has lost over 100 lbs. [facility] LCSW provided extensive positive reinforcement for this achievement. Patient was encouraged to continue putting in place healthy boundaries with negative people, places and things to maintain her happiness and health. 01/21/23 update- Patient's aunt will be taken off life support today which has triggered her grief. She reports that she is not feeling well and will not be able to go visit her aunt in the hospital to say goodbye which would have helped her gain some closure but her legs are really bothering her and are not healing. Grief management support provided. Patient continues to not want therapy even though it was strongly encouraged by [facility] LCSW. [facility] LCSW will follow up in 60 days due to current family loss. Patient reports that she and her spouse have been decorating for Halloween which brings them closeness and joy.   Managing Loss, Adult People experience loss in many different ways throughout their lives. Events such as moving, changing jobs, and losing friends can create a sense of loss. The loss may be as serious as a  major health change, divorce, death of a pet, or death of a loved one. All of these types of loss are likely to create a physical and emotional reaction known as grief. Grief is the result of a major change or an absence of something or someone that you count on. Grief is a normal reaction to loss. A variety of factors can affect your grieving experience, including: The nature of your loss. Your relationship to what or whom you lost. Your understanding of grief and how to manage it. Your support system. How to manage lifestyle changes Keep to your normal routine as much as possible. If you have trouble focusing or doing normal  activities, it is acceptable to take some time away from your normal routine. Spend time with friends and loved ones. Eat a healthy diet, get plenty of sleep, and rest when you feel tired. How to recognize changes  The way that you deal with your grief will affect your ability to function as you normally do. When grieving, you may experience these changes: Numbness, shock, sadness, anxiety, anger, denial, and guilt. Thoughts about death. Unexpected crying. A physical sensation of emptiness in your stomach. Problems sleeping and eating. Tiredness (fatigue). Loss of interest in normal activities. Dreaming about or imagining seeing the person who died. A need to remember what or whom you lost. Difficulty thinking about anything other than your loss for a period of time. Relief. If you have been expecting the loss for a while, you may feel a sense of relief when it happens. Follow these instructions at home:    Activity Express your feelings in healthy ways, such as: Talking with others about your loss. It may be helpful to find others who have had a similar loss, such as a support group. Writing down your feelings in a journal. Doing physical activities to release stress and emotional energy. Doing creative activities like painting, sculpting, or playing or listening to music. Practicing resilience. This is the ability to recover and adjust after facing challenges. Reading some resources that encourage resilience may help you to learn ways to practice those behaviors. General instructions Be patient with yourself and others. Allow the grieving process to happen, and remember that grieving takes time. It is likely that you may never feel completely done with some grief. You may find a way to move on while still cherishing memories and feelings about your loss. Accepting your loss is a process. It can take months or longer to adjust. Keep all follow-up visits as told by your health care  provider. This is important. Where to find support To get support for managing loss: Ask your health care provider for help and recommendations, such as grief counseling or therapy. Think about joining a support group for people who are managing a loss. Where to find more information You can find more information about managing loss from: American Society of Clinical Oncology: www.cancer.net American Psychological Association: [employer] Contact a health care provider if: Your grief is extreme and keeps getting worse. You have ongoing grief that does not improve. Your body shows symptoms of grief, such as illness. You feel depressed, anxious, or lonely. Get help right away if: You have thoughts about hurting yourself or others. If you ever feel like you may hurt yourself or others, or have thoughts about taking your own life, get help right away. You can go to your nearest emergency department or call: Your local emergency services (911 in the [place] A suicide crisis helpline, such  as the National Suicide Prevention Lifeline at (210)564-4253. This is open 24 hours a day. Summary Grief is the result of a major change or an absence of someone or something that you count on. Grief is a normal reaction to loss. The depth of grief and the period of recovery depend on the type of loss and your ability to adjust to the change and process your feelings. Processing grief requires patience and a willingness to accept your feelings and talk about your loss with people who are supportive. It is important to find resources that work for you and to realize that people experience grief differently. There is not one grieving process that works for everyone in the same way. Be aware that when grief becomes extreme, it can lead to more severe issues like isolation, depression, anxiety, or suicidal thoughts. Talk with your health care provider if you have any of these issues. This information is not intended  to replace advice given to you by your health care provider. Make sure you discuss any questions you have with your health care provider. Document Revised: 06/17/2018 Document Reviewed: 08/27/2016 Elsevier Patient Education  2020 [employer].  Help for Managing Grief   When you are experiencing grief, many resources and support are available to help you. You are not alone.    Grief Share (https://www.SunglassSpecialist.gl):    [employer] of [address] [place], [place]      [employer]  [phone] [address] [place], [place]     [employer] [facility] [address] [place], [place]      [facility] [address] [place], [place]  [phone]   [facility] [address] [place], [place] [phone]   [facility] [address] [place], [place]     [phone]   [facility] [address] [place], [place]     [phone]   If a Hospice or Palliative Care team has been involved in the care of your loved one, you may reach out to your contact there or locally, you may reach out to [facility]:    [facility]   [address] [place], [place] [address] [phone] Patient was educated on healthy self-care as she admits she has little motivation to walk lately. PCS recommendation was suggested.   Patient Self Care Activities:  Attends all scheduled provider appointments Calls provider office for new concerns or questions  Please see past updates related to this goal by clicking on the "Past Updates" button in the selected goal        Follow up:  Patient agrees to Care Plan and Follow-up.  Plan: The Managed Medicaid care management team will reach out to the patient again over the next 60 days.  [name], BSW, MSW, [employer] Managed Medicaid LCSW [facility]  [facility] [email or web address] Phone: [phone]

## 2023-01-22 ENCOUNTER — Other Ambulatory Visit (HOSPITAL_COMMUNITY): Payer: Self-pay

## 2023-01-25 DIAGNOSIS — G4736 Sleep related hypoventilation in conditions classified elsewhere: Secondary | ICD-10-CM | POA: Diagnosis not present

## 2023-01-25 DIAGNOSIS — J449 Chronic obstructive pulmonary disease, unspecified: Secondary | ICD-10-CM | POA: Diagnosis not present

## 2023-01-26 DIAGNOSIS — Z419 Encounter for procedure for purposes other than remedying health state, unspecified: Secondary | ICD-10-CM | POA: Diagnosis not present

## 2023-02-01 ENCOUNTER — Observation Stay
Admission: EM | Admit: 2023-02-01 | Discharge: 2023-02-02 | Disposition: A | Discharge status: Home / Self Care | Payer: Medicaid Other | Attending: Osteopathic Medicine | Admitting: Osteopathic Medicine

## 2023-02-01 ENCOUNTER — Ambulatory Visit: Payer: Medicaid Other | Admitting: Obstetrics and Gynecology

## 2023-02-01 ENCOUNTER — Other Ambulatory Visit: Payer: Self-pay

## 2023-02-01 DIAGNOSIS — J45909 Unspecified asthma, uncomplicated: Secondary | ICD-10-CM | POA: Insufficient documentation

## 2023-02-01 DIAGNOSIS — L03116 Cellulitis of left lower limb: Principal | ICD-10-CM | POA: Insufficient documentation

## 2023-02-01 DIAGNOSIS — I87323 Chronic venous hypertension (idiopathic) with inflammation of bilateral lower extremity: Secondary | ICD-10-CM | POA: Diagnosis not present

## 2023-02-01 DIAGNOSIS — E039 Hypothyroidism, unspecified: Secondary | ICD-10-CM | POA: Insufficient documentation

## 2023-02-01 DIAGNOSIS — I10 Essential (primary) hypertension: Secondary | ICD-10-CM | POA: Insufficient documentation

## 2023-02-01 DIAGNOSIS — L309 Dermatitis, unspecified: Secondary | ICD-10-CM | POA: Diagnosis not present

## 2023-02-01 DIAGNOSIS — E1140 Type 2 diabetes mellitus with diabetic neuropathy, unspecified: Secondary | ICD-10-CM | POA: Diagnosis not present

## 2023-02-01 DIAGNOSIS — L97929 Non-pressure chronic ulcer of unspecified part of left lower leg with unspecified severity: Secondary | ICD-10-CM | POA: Diagnosis not present

## 2023-02-01 DIAGNOSIS — I83019 Varicose veins of right lower extremity with ulcer of unspecified site: Principal | ICD-10-CM

## 2023-02-01 DIAGNOSIS — J449 Chronic obstructive pulmonary disease, unspecified: Secondary | ICD-10-CM | POA: Diagnosis not present

## 2023-02-01 DIAGNOSIS — L97919 Non-pressure chronic ulcer of unspecified part of right lower leg with unspecified severity: Principal | ICD-10-CM

## 2023-02-01 DIAGNOSIS — L0390 Cellulitis, unspecified: Secondary | ICD-10-CM | POA: Diagnosis present

## 2023-02-01 LAB — CBC
HCT: 35.7 % — ABNORMAL LOW (ref 36.0–46.0)
Hemoglobin: 11.7 g/dL — ABNORMAL LOW (ref 12.0–15.0)
MCH: 27.6 pg (ref 26.0–34.0)
MCHC: 32.8 g/dL (ref 30.0–36.0)
MCV: 84.2 fL (ref 80.0–100.0)
Platelets: 386 K/uL (ref 150–400)
RBC: 4.24 MIL/uL (ref 3.87–5.11)
RDW: 15.5 % (ref 11.5–15.5)
WBC: 8.3 K/uL (ref 4.0–10.5)
nRBC: 0 % (ref 0.0–0.2)

## 2023-02-01 LAB — BASIC METABOLIC PANEL WITH GFR
Anion gap: 9 (ref 5–15)
BUN: 23 mg/dL — ABNORMAL HIGH (ref 6–20)
CO2: 24 mmol/L (ref 22–32)
Calcium: 8.9 mg/dL (ref 8.9–10.3)
Chloride: 106 mmol/L (ref 98–111)
Creatinine, Ser: 0.66 mg/dL (ref 0.44–1.00)
GFR, Estimated: >60 mL/min
Glucose, Bld: 147 mg/dL — ABNORMAL HIGH (ref 70–99)
Potassium: 4.1 mmol/L (ref 3.5–5.1)
Sodium: 139 mmol/L (ref 135–145)

## 2023-02-01 MED ORDER — ACETAMINOPHEN 650 MG RE SUPP: 650.0000 mg | Freq: Four times a day (QID) | RECTAL | Status: DC | PRN

## 2023-02-01 MED ORDER — DOXAZOSIN MESYLATE 1 MG PO TABS
1.0000 mg | ORAL_TABLET | Freq: Two times a day (BID) | ORAL | Status: DC
  Administered 2023-02-01 – 2023-02-02 (×2): 1 mg via ORAL
  Filled 2023-02-01 (×2): qty 1

## 2023-02-01 MED ORDER — ATORVASTATIN CALCIUM 20 MG PO TABS
80.0000 mg | ORAL_TABLET | Freq: Every day | ORAL | Status: DC
  Administered 2023-02-01 – 2023-02-02 (×2): 80 mg via ORAL
  Filled 2023-02-01 (×2): qty 4

## 2023-02-01 MED ORDER — SODIUM CHLORIDE 0.9 % IV SOLN
2.0000 g | Freq: Once | INTRAVENOUS | Status: AC
  Administered 2023-02-01: 2 g via INTRAVENOUS
  Filled 2023-02-01: qty 10

## 2023-02-01 MED ORDER — FUROSEMIDE 10 MG/ML IJ SOLN
40.0000 mg | Freq: Once | INTRAMUSCULAR | Status: AC
  Administered 2023-02-01: 40 mg via INTRAVENOUS
  Filled 2023-02-01: qty 4

## 2023-02-01 MED ORDER — LISINOPRIL 10 MG PO TABS
40.0000 mg | ORAL_TABLET | Freq: Every day | ORAL | Status: DC
  Administered 2023-02-01 – 2023-02-02 (×2): 40 mg via ORAL
  Filled 2023-02-01 (×2): qty 4

## 2023-02-01 MED ORDER — CITALOPRAM HYDROBROMIDE 20 MG PO TABS
40.0000 mg | ORAL_TABLET | Freq: Every day | ORAL | Status: DC
  Administered 2023-02-01 – 2023-02-02 (×2): 40 mg via ORAL
  Filled 2023-02-01 (×2): qty 2

## 2023-02-01 MED ORDER — MELOXICAM 7.5 MG PO TABS
15.0000 mg | ORAL_TABLET | Freq: Every day | ORAL | Status: DC
  Administered 2023-02-02: 15 mg via ORAL
  Filled 2023-02-01: qty 2

## 2023-02-01 MED ORDER — ACETAMINOPHEN 325 MG PO TABS: 650.0000 mg | ORAL_TABLET | Freq: Four times a day (QID) | ORAL | Status: DC | PRN

## 2023-02-01 MED ORDER — ENOXAPARIN SODIUM 80 MG/0.8ML IJ SOSY
70.0000 mg | PREFILLED_SYRINGE | INTRAMUSCULAR | Status: DC
  Administered 2023-02-01: 70 mg via SUBCUTANEOUS
  Filled 2023-02-01 (×2): qty 0.7

## 2023-02-01 MED ORDER — ONDANSETRON HCL 4 MG/2ML IJ SOLN: 4.0000 mg | Freq: Four times a day (QID) | INTRAMUSCULAR | Status: DC | PRN

## 2023-02-01 MED ORDER — ONDANSETRON HCL 4 MG PO TABS: 4.0000 mg | ORAL_TABLET | Freq: Four times a day (QID) | ORAL | Status: DC | PRN

## 2023-02-01 MED ORDER — FLUTICASONE FUROATE-VILANTEROL 100-25 MCG/ACT IN AEPB
1.0000 | INHALATION_SPRAY | Freq: Every day | RESPIRATORY_TRACT | Status: DC
  Filled 2023-02-01: qty 28

## 2023-02-01 MED ORDER — SENNOSIDES-DOCUSATE SODIUM 8.6-50 MG PO TABS: 1.0000 | ORAL_TABLET | Freq: Every evening | ORAL | Status: DC | PRN

## 2023-02-01 MED ORDER — GABAPENTIN 300 MG PO CAPS
300.0000 mg | ORAL_CAPSULE | Freq: Three times a day (TID) | ORAL | Status: DC
  Administered 2023-02-01 – 2023-02-02 (×3): 300 mg via ORAL
  Filled 2023-02-01 (×3): qty 1

## 2023-02-01 MED ORDER — PANTOPRAZOLE SODIUM 40 MG PO TBEC
40.0000 mg | DELAYED_RELEASE_TABLET | Freq: Every day | ORAL | Status: DC
  Administered 2023-02-02: 40 mg via ORAL
  Filled 2023-02-01: qty 1

## 2023-02-01 MED ORDER — VANCOMYCIN HCL 2000 MG/400ML IV SOLN
2000.0000 mg | Freq: Once | INTRAVENOUS | Status: AC
  Administered 2023-02-01: 2000 mg via INTRAVENOUS
  Filled 2023-02-01: qty 400

## 2023-02-01 MED ORDER — ALBUTEROL SULFATE (2.5 MG/3ML) 0.083% IN NEBU: 2.5000 mg | INHALATION_SOLUTION | RESPIRATORY_TRACT | Status: DC | PRN

## 2023-02-01 MED ORDER — HYDRALAZINE HCL 50 MG PO TABS
50.0000 mg | ORAL_TABLET | Freq: Three times a day (TID) | ORAL | Status: DC
  Administered 2023-02-01 – 2023-02-02 (×2): 50 mg via ORAL
  Filled 2023-02-01 (×2): qty 1

## 2023-02-01 MED ORDER — MONTELUKAST SODIUM 10 MG PO TABS
10.0000 mg | ORAL_TABLET | Freq: Every day | ORAL | Status: DC
  Administered 2023-02-01: 10 mg via ORAL
  Filled 2023-02-01: qty 1

## 2023-02-01 MED ORDER — LEVOTHYROXINE SODIUM 50 MCG PO TABS
50.0000 ug | ORAL_TABLET | Freq: Every day | ORAL | Status: DC
  Administered 2023-02-02: 50 ug via ORAL
  Filled 2023-02-01: qty 1

## 2023-02-01 MED ORDER — SPIRONOLACTONE 12.5 MG HALF TABLET
12.5000 mg | ORAL_TABLET | Freq: Every day | ORAL | Status: DC
  Administered 2023-02-02: 12.5 mg via ORAL
  Filled 2023-02-01: qty 1

## 2023-02-01 MED ORDER — HYDROCODONE-ACETAMINOPHEN 5-325 MG PO TABS
1.0000 | ORAL_TABLET | ORAL | Status: DC | PRN
  Administered 2023-02-01: 1 via ORAL
  Administered 2023-02-02: 2 via ORAL
  Filled 2023-02-01: qty 2
  Filled 2023-02-01: qty 1

## 2023-02-01 MED ORDER — UMECLIDINIUM BROMIDE 62.5 MCG/ACT IN AEPB
1.0000 | INHALATION_SPRAY | Freq: Every day | RESPIRATORY_TRACT | Status: DC
  Filled 2023-02-01: qty 7

## 2023-02-01 NOTE — H&P (Signed)
HISTORY AND PHYSICAL    Cheryl Chandler   ZOX:[id number] DOB: 09-24-1965   Date of Service: 02/01/23 Requesting physician/APP from ED: Treatment Team:  Attending Provider: Sunnie Nielsen, DO  PCP: [name], NP   HPI: Cheryl Chandler is a 57 y.o. female whohas PMH HLD on statin, HTN on doxazosin lasix hydralazine lisinopril spironolactone lasix edema on lasix, anxiety/depression on clexa and hydroxyzine, DM2 on trulicity and neuropathy on gabapentin, hypothryoid on synthroid, GERD on omeprazole, asthma/COPD on prn albuterol trelegy and singulair, on home O2 3L, hx anaphylaxis to penicillin .   Presented to ED 02/01/23 w/ cc LE edema/pain and redness on L leg, reports recently Rx abx but unsure name and nothing in the EHR to reference. She has been sleeping in her wheelchair recently instead of her recliner, not elevating legs. Not elevating legs much. Poor historian, "I go to a lot of doctors all the time." No fever/chills. Legs feel painful and almost itching sensation, worse on L   Records reviewed: last outpatient visit w/ Dr Gilda Crease (vascular) was 11/16/22. Venous ulceration no open sores/wounds, has been using lymphedema pump, started on Unna boots to change weekly.     Hospital course / significant events:  10/07: to ED. Concern for cellulitis, not septic. Keeping for iv abx and needs to elevate legs, if redness improves w/ leg elevation unlikely to be infectious   Consultants:  none  Procedures/Surgeries: none      ASSESSMENT & PLAN:   Erythema and pain question cellulitis vs lymphedema / venous stasis dermatitis  Venous stasis ulcers  Leg elevation above level of the heart  Wound care - hibiclens wash to bilateral lower legs  Continue abx for now - severe PCN allergy, continue w/ vancomycin and aztreonam, deescalate as able  Trial IV lasix may reduce edema, monitor BMP Pharmacy to confirm recent abx dispenses    HLD  Continue statin  HTN   Continue doxazosin lasix hydralazine lisinopril spironolactone  Anxiety/depression  Continue Celexa and hydroxyzine,   DM2  On trulicity   Neuropathy  Continue gabapentin,  Hypothryoid  Continue synthroid  GERD  Continue PPI  Asthma/COPD  Chronic hypoxic respiratory failure on home O2 3L prn albuterol  Continue trelegy and singulair O2 via [place]    Morbid obesity based on BMI: Body mass index is 52.42 kg/m.   DVT prophylaxis: lovenox  IV fluids: no continuous IV fluids  Nutrition: carb modified diet  Central lines / invasive devices: none  Code Status: FULL CODE ACP documentation reviewed:  none on file in [facility]  TOC needs: TBD may need home health/ DME but seems at baseline now Barriers to dispo / significant pending items: clinical improvement                    Review of Systems:  Review of Systems  Constitutional:  Positive for malaise/fatigue. Negative for chills and fever.  HENT:  Negative for sinus pain and sore throat.   Respiratory:  Positive for shortness of breath. Negative for cough.   Cardiovascular:  Positive for leg swelling. Negative for chest pain, palpitations and claudication.  Gastrointestinal:  Negative for abdominal pain, heartburn, nausea and vomiting.  Genitourinary:  Negative for frequency and urgency.  Musculoskeletal:  Positive for myalgias.  Skin:  Positive for itching and rash.  Neurological:  Negative for dizziness and focal weakness.  Psychiatric/Behavioral:  Negative for depression.        has a past medical history of Arthritis, Asthma, COPD (  chronic obstructive pulmonary disease) (HCC), Hypertension, Neuropathy, Pericarditis, Personal history of urinary calculi (11/29/2014), and Thyroid disease. (Not in an outpatient encounter)   Allergies  Allergen Reactions   Codeine Shortness Of Breath and Rash   Percocet [Oxycodone-Acetaminophen] Shortness Of Breath   Sulfa Antibiotics Shortness Of Breath and Rash    Aspirin Hives   Bee Venom     Bee stings   Darvon [Propoxyphene]     Darvocet-N 100   Latex    Oxycodone Nausea And Vomiting   Penicillins     Has patient had a PCN reaction causing immediate rash, facial/tongue/throat swelling, SOB or lightheadedness with hypotension: Unknown Has patient had a PCN reaction causing severe rash involving mucus membranes or skin necrosis: Unknown Has patient had a PCN reaction that required hospitalization: Unknown Has patient had a PCN reaction occurring within the last 10 years: Unknown If all of the above answers are "NO", then may proceed with Cephalosporin use.      family history includes Heart disease in her mother; Hyperlipidemia in her mother; Hypertension in her mother; Seizures in her sister. Past Surgical History:  Procedure Laterality Date   APPENDECTOMY     arm surgery Left    pins   fatty tumor removed from back     JOINT REPLACEMENT Right    LEG SURGERY Bilateral    OVARIAN CYST SURGERY Right    took piece of right ovary due to cyst   TOTAL HIP ARTHROPLASTY Right    wisdom teeth removed            Objective Findings:  Vitals:   02/01/23 1338 02/01/23 1339 02/01/23 1600 02/01/23 1630  BP: (!) 146/62  (!) 140/56 (!) 112/92  Pulse:   80 84  Resp:    20  Temp:      SpO2: 96%  99% 97%  Weight:  (!) 142.9 kg    Height:  5\' 5"  (1.651 m)     No intake or output data in the 24 hours ending 02/01/23 1748 Filed Weights   02/01/23 1339  Weight: (!) 142.9 kg    Examination:  Physical Exam Constitutional:      General: She is not in acute distress.    Appearance: She is obese.  Cardiovascular:     Rate and Rhythm: Normal rate and regular rhythm.     Heart sounds: Normal heart sounds.  Pulmonary:     Effort: Pulmonary effort is normal.     Breath sounds: Normal breath sounds.  Abdominal:     General: Bowel sounds are normal. There is no distension.     Palpations: Abdomen is soft.  Musculoskeletal:     Right lower  leg: Edema present.     Left lower leg: Edema present.  Skin:    General: Skin is warm.     Findings: Rash (see photos) present.  Neurological:     General: No focal deficit present.     Mental Status: She is alert and oriented to person, place, and time. Mental status is at baseline.  Psychiatric:        Mood and Affect: Mood normal.        Behavior: Behavior normal.             Scheduled Medications:   atorvastatin  80 mg Oral Daily   citalopram  40 mg Oral Daily   doxazosin  1 mg Oral BID   enoxaparin (LOVENOX) injection  40 mg Subcutaneous Q24H   fluticasone furoate-vilanterol  1 puff Inhalation Daily   And   umeclidinium bromide  1 puff Inhalation Daily   furosemide  40 mg Intravenous Once   gabapentin  300 mg Oral TID   hydrALAZINE  50 mg Oral TID   [START ON 02/02/2023] levothyroxine  50 mcg Oral Q0600   lisinopril  40 mg Oral Daily   [START ON 02/02/2023] meloxicam  15 mg Oral Daily   montelukast  10 mg Oral QHS   pantoprazole  40 mg Oral Daily   spironolactone  12.5 mg Oral Daily    Continuous Infusions:  aztreonam     vancomycin HCl 2,000 mg (02/01/23 1715)    PRN Medications:  acetaminophen **OR** acetaminophen, albuterol, HYDROcodone-acetaminophen, ondansetron **OR** ondansetron (ZOFRAN) IV, senna-docusate  Antimicrobials:  Anti-infectives (From admission, onward)    Start     Dose/Rate Route Frequency Ordered Stop   02/01/23 1645  aztreonam (AZACTAM) 2 g in sodium chloride 0.9 % 100 mL IVPB        2 g 200 mL/hr over 30 Minutes Intravenous  Once 02/01/23 1634     02/01/23 1630  vancomycin (VANCOREADY) IVPB 2000 mg/400 mL        2,000 mg 200 mL/hr over 120 Minutes Intravenous  Once 02/01/23 1621             Data Reviewed: I have personally reviewed following labs and imaging studies  CBC: Recent Labs  Lab 02/01/23 1338  WBC 8.3  HGB 11.7*  HCT 35.7*  MCV 84.2  PLT 386   Basic Metabolic Panel: Recent Labs  Lab 02/01/23 1338   NA 139  K 4.1  CL 106  CO2 24  GLUCOSE 147*  BUN 23*  CREATININE 0.66  CALCIUM 8.9   GFR: Estimated Creatinine Clearance: 113.3 mL/min (by C-G formula based on SCr of 0.66 mg/dL). Liver Function Tests: No results for input(s): "AST", "ALT", "ALKPHOS", "BILITOT", "PROT", "ALBUMIN" in the last 168 hours. No results for input(s): "LIPASE", "AMYLASE" in the last 168 hours. No results for input(s): "AMMONIA" in the last 168 hours. Coagulation Profile: No results for input(s): "INR", "PROTIME" in the last 168 hours. Cardiac Enzymes: No results for input(s): "CKTOTAL", "CKMB", "CKMBINDEX", "TROPONINI" in the last 168 hours. BNP (last 3 results) No results for input(s): "PROBNP" in the last 8760 hours. HbA1C: No results for input(s): "HGBA1C" in the last 72 hours. CBG: No results for input(s): "GLUCAP" in the last 168 hours. Lipid Profile: No results for input(s): "CHOL", "HDL", "LDLCALC", "TRIG", "CHOLHDL", "LDLDIRECT" in the last 72 hours. Thyroid Function Tests: No results for input(s): "TSH", "T4TOTAL", "FREET4", "T3FREE", "THYROIDAB" in the last 72 hours. Anemia Panel: No results for input(s): "VITAMINB12", "FOLATE", "FERRITIN", "TIBC", "IRON", "RETICCTPCT" in the last 72 hours. Most Recent Urinalysis On File:     Component Value Date/Time   COLORURINE STRAW (A) 02/05/2021 1420   APPEARANCEUR Cloudy (A) 06/04/2021 1428   LABSPEC 1.006 02/05/2021 1420   LABSPEC 1.025 08/05/2014 1306   PHURINE 7.0 02/05/2021 1420   GLUCOSEU Negative 06/04/2021 1428   GLUCOSEU Negative 08/05/2014 1306   HGBUR NEGATIVE 02/05/2021 1420   BILIRUBINUR Negative 06/04/2021 1428   BILIRUBINUR Negative 08/05/2014 1306   KETONESUR NEGATIVE 02/05/2021 1420   PROTEINUR Negative 06/04/2021 1428   PROTEINUR NEGATIVE 02/05/2021 1420   NITRITE Negative 06/04/2021 1428   NITRITE NEGATIVE 02/05/2021 1420   LEUKOCYTESUR Negative 06/04/2021 1428   LEUKOCYTESUR NEGATIVE 02/05/2021 1420   LEUKOCYTESUR  Negative 08/05/2014 1306   Sepsis Labs: @LABRCNTIP (procalcitonin:4,lacticidven:4)  No results  found for this or any previous visit (from the past 240 hour(s)).       Radiology Studies: No results found.           LOS: 0 days     Sunnie Nielsen, DO [facility] 02/01/2023, 5:48 PM    Dictation software may have been used to generate the above note. Typos may occur and escape review in typed/dictated notes. Please contact Dr Lyn Hollingshead directly for clarity if needed.  Staff may message me via secure chat in Epic  but this may not receive an immediate response,  please page me for urgent matters!  If 7PM-7AM, please contact night coverage [email or web address]

## 2023-02-01 NOTE — ED Provider Notes (Signed)
[facility] Provider Note    Event Date/Time   First MD Initiated Contact with Patient 02/01/23 1543     (approximate)   History   Wound Infection   HPI Cheryl Chandler is a 57 y.o. female with DM2, HTN, HLD, COPD, venous ulcers presenting today for worsening leg wounds.  Patient states having chronic venous ulcers to bilateral lower extremities.  She reports being on oral antibiotic for the past month but is not sure which one.  She states wounds have been getting worse with purulent drainage coming out of the left side with worsening redness and pain to the left lower extremity.  She did notes intermittent chills but has not taken her temperature for any true fevers.  Otherwise denies any new pain symptoms outside of her legs.  No nausea or vomiting.     Physical Exam   Triage Vital Signs: ED Triage Vitals  Encounter Vitals Group     BP 02/01/23 1338 (!) 146/62     Systolic BP Percentile --      Diastolic BP Percentile --      Pulse Rate 02/01/23 1337 77     Resp 02/01/23 1337 20     Temp 02/01/23 1337 98.2 F (36.8 C)     Temp src --      SpO2 02/01/23 1338 96 %     Weight 02/01/23 1339 (!) 315 lb (142.9 kg)     Height 02/01/23 1339 5\' 5"  (1.651 m)     Head Circumference --      Peak Flow --      Pain Score 02/01/23 1339 10     Pain Loc --      Pain Education --      Exclude from Growth Chart --     Most recent vital signs: Vitals:   02/01/23 1337 02/01/23 1338  BP:  (!) 146/62  Pulse: 77   Resp: 20   Temp: 98.2 F (36.8 C)   SpO2:  96%   Physical Exam: I have reviewed the vital signs and nursing notes. General: Awake, alert, no acute distress.  Nontoxic appearing.  Obese. Head:  Atraumatic, normocephalic.   ENT:  EOM intact, PERRL. Oral mucosa is pink and moist with no lesions. Neck: Neck is supple with full range of motion, No meningeal signs. Cardiovascular:  RRR, No murmurs. Peripheral pulses palpable and equal  bilaterally. Respiratory:  Symmetrical chest wall expansion.  No rhonchi, rales, or wheezes.  Good air movement throughout.  No use of accessory muscles.   Musculoskeletal:  No cyanosis or edema. Moving extremities with full ROM Abdomen:  Soft, nontender, nondistended. Neuro:  GCS 15, moving all four extremities, interacting appropriately. Speech clear. Psych:  Calm, appropriate.   Skin: Chronic venous ulcer wounds to bilateral lower extremities worse on the left side with noted purulence and surrounding erythema.  Very tender to palpation on that side.   ED Results / Procedures / Treatments   Labs (all labs ordered are listed, but only abnormal results are displayed) Labs Reviewed  CBC - Abnormal; Notable for the following components:      Result Value   Hemoglobin 11.7 (*)    HCT 35.7 (*)    All other components within normal limits  BASIC METABOLIC PANEL - Abnormal; Notable for the following components:   Glucose, Bld 147 (*)    BUN 23 (*)    All other components within normal limits     EKG  RADIOLOGY    PROCEDURES:  Critical Care performed: No  Procedures   MEDICATIONS ORDERED IN ED: Medications  vancomycin (VANCOREADY) IVPB 2000 mg/400 mL (has no administration in time range)     IMPRESSION / MDM / ASSESSMENT AND PLAN / ED COURSE  I reviewed the triage vital signs and the nursing notes.                              Differential diagnosis includes, but is not limited to, chronic venous ulcers, cellulitis, purulent discharge, sepsis.  Patient's presentation is most consistent with severe exacerbation of chronic illness.  Patient is a 57 year old female with history of chronic venous ulcers to bilateral lower extremities presenting today for worsening pain, erythema, and purulent discharge.  Exam notable for purulent discharge on the left lower extremity with surrounding erythema and pain to touch with warmth.  Reportedly has been on oral antibiotics for  the past month but unable to verify this with the patient or in her chart.  She does not appear septic on exam right now.  Discussed the case with vascular surgery who follows her outpatient and given the severity of it at this time, does recommend admission for IV antibiotics.  Patient was admitted to hospitalist for further care.  Given penicillin allergy with reported anaphylaxis, discussed the case with pharmacy who recommends initiation with vancomycin and aztreonam for bacterial coverage.  The patient is on the cardiac monitor to evaluate for evidence of arrhythmia and/or significant heart rate changes. Clinical Course as of 02/01/23 1628  Mon Feb 01, 2023  1621 Discussed case with Dr. Gilda Crease who recommends admission for IV antibiotics to gain better control of venous ulcers with cellulitis surrounding the area. [DW]    Clinical Course User Index [DW] Janith Lima, MD     FINAL CLINICAL IMPRESSION(S) / ED DIAGNOSES   Final diagnoses:  Venous ulcers of both lower extremities (HCC)  Cellulitis of left lower extremity     Rx / DC Orders   ED Discharge Orders     None        Note:  This document was prepared using Dragon voice recognition software and may include unintentional dictation errors.   Janith Lima, MD 02/01/23 [phone]

## 2023-02-01 NOTE — Consult Note (Signed)
PHARMACY -  BRIEF ANTIBIOTIC NOTE   Pharmacy has received consult(s) for Aztreonam from an ED provider.  The patient's profile has been reviewed for ht/wt/allergies/indication/available labs.    One time order(s) placed for : Aztreonam 2gm IV x 1  Further antibiotics/pharmacy consults should be ordered by admitting physician if indicated.                       Thank you, Timarion Agcaoili Rodriguez-Guzman PharmD, BCPS 02/01/2023 4:35 PM

## 2023-02-01 NOTE — ED Triage Notes (Signed)
Pt to ED for wounds to bilateral legs, reports now has blisters. Taking antibiotics.

## 2023-02-01 NOTE — Hospital Course (Addendum)
HPI: Cheryl Chandler is a 57 y.o. female whohas PMH HLD on statin, HTN on doxazosin lasix hydralazine lisinopril spironolactone lasix edema on lasix, anxiety/depression on clexa and hydroxyzine, DM2 on trulicity and neuropathy on gabapentin, hypothryoid on synthroid, GERD on omeprazole, asthma/COPD on prn albuterol trelegy and singulair, on home O2 3L, hx anaphylaxis to penicillin .   Presented to ED 02/01/23 w/ cc LE edema/pain and redness on L leg, reports recently Rx abx but unsure name and nothing in the EHR to reference. She has been sleeping in her wheelchair recently instead of her recliner, not elevating legs. Not elevating legs much. Poor historian, "I go to a lot of doctors all the time." No fever/chills. Legs feel painful and almost itching sensation, worse on L   Records reviewed: last outpatient visit w/ Dr Gilda Crease (vascular) was 11/16/22. Venous ulceration no open sores/wounds, has been using lymphedema pump, started on Unna boots to change weekly.     Hospital course / significant events:  10/07: to ED. Concern for cellulitis, not septic. Keeping for iv abx and needs to elevate legs, if redness improves w/ leg elevation unlikely to be infectious   Consultants:  none  Procedures/Surgeries: none      ASSESSMENT & PLAN:   Erythema and pain question cellulitis vs lymphedema / venous stasis dermatitis  Venous stasis ulcers  Leg elevation above level of the heart  Wound care - hibiclens wash to bilateral lower legs  Continue abx for now - severe PCN allergy, continue w/ vancomycin and aztreonam, deescalate as able  Trial IV lasix may reduce edema, monitor BMP Pharmacy to confirm recent abx dispenses   Venous stasis ulceration  Dressing procedure/placement/frequency: Cleanse bilateral lower legs with Vashe wound cleanser [place] [phone]),  cover all open weeping ulcers with silver hydrofiber (Aquacel [place] [phone]) cover with ABD pads  secure with Kerlix roll  gauze beginning just above toes and ending right below knees.   Can also apply Ace bandage if patient can tolerate for some light compression.   Follow w/ vascular outpatient Pt declined Unna boots  HLD  Continue statin  HTN  Per last cardiology note - d/c amlodipine and started cardura/doxazosin Otherwise home meds  Spironolactone 12.5 mg daily  Lisinopril 40 mg dialy  Hydralazine 50 mg tid  Lasix 20 mg prn edema  BP on low side this morning but no symptoms - given morbid obesity and unable to get bP on the lower extremity d/t skin breakdown, question accuracy of low readings, pt is not symptomatic Hold hydralazine and cardura on d/c and follow w/ cardiology   Anxiety/depression  Continue Celexa and hydroxyzine,   DM2  On trulicity   Neuropathy  Continue gabapentin,  Hypothryoid  Continue synthroid  GERD  Continue PPI  Asthma/COPD  Chronic hypoxic respiratory failure on home O2 3L prn albuterol  Continue trelegy and singulair O2 via     Morbid obesity based on BMI: Body mass index is 52.42 kg/m.   DVT prophylaxis: lovenox  IV fluids: no continuous IV fluids  Nutrition: carb modified diet  Central lines / invasive devices: none  Code Status: FULL CODE ACP documentation reviewed:  none on file in [facility]  TOC needs: TBD may need home health/ DME but seems at baseline now Barriers to dispo / significant pending items: clinical improvement

## 2023-02-02 ENCOUNTER — Encounter: Payer: Self-pay | Admitting: Osteopathic Medicine

## 2023-02-02 ENCOUNTER — Ambulatory Visit: Payer: Medicaid Other | Admitting: Obstetrics and Gynecology

## 2023-02-02 DIAGNOSIS — L309 Dermatitis, unspecified: Secondary | ICD-10-CM | POA: Diagnosis present

## 2023-02-02 LAB — BASIC METABOLIC PANEL
Anion gap: 6 (ref 5–15)
BUN: 25 mg/dL — ABNORMAL HIGH (ref 6–20)
CO2: 26 mmol/L (ref 22–32)
Calcium: 8.2 mg/dL — ABNORMAL LOW (ref 8.9–10.3)
Chloride: 106 mmol/L (ref 98–111)
Creatinine, Ser: 0.79 mg/dL (ref 0.44–1.00)
GFR, Estimated: >60 mL/min (ref >60)
Glucose, Bld: 116 mg/dL — ABNORMAL HIGH (ref 70–99)
Potassium: 4 mmol/L (ref 3.5–5.1)
Sodium: 138 mmol/L (ref 135–145)

## 2023-02-02 LAB — CBC
HCT: 33.9 % — ABNORMAL LOW (ref 36.0–46.0)
Hemoglobin: 10.8 g/dL — ABNORMAL LOW (ref 12.0–15.0)
MCH: 27.5 pg (ref 26.0–34.0)
MCHC: 31.9 g/dL (ref 30.0–36.0)
MCV: 86.3 fL (ref 80.0–100.0)
Platelets: 364 10*3/uL (ref 150–400)
RBC: 3.93 MIL/uL (ref 3.87–5.11)
RDW: 15.8 % — ABNORMAL HIGH (ref 11.5–15.5)
WBC: 8.2 10*3/uL (ref 4.0–10.5)
nRBC: 0 % (ref 0.0–0.2)

## 2023-02-02 MED ORDER — VASHE WOUND 0.033 % EX SOLN: 1.0000 | Freq: Every day | CUTANEOUS | 1 refills | Status: DC

## 2023-02-02 MED ORDER — DOXYCYCLINE HYCLATE 100 MG PO TABS: 100.0000 mg | ORAL_TABLET | Freq: Two times a day (BID) | ORAL | 0 refills | Status: DC

## 2023-02-02 MED ORDER — "AQUACEL AG FOAM 8""X8"" EX PADS": MEDICATED_PAD | CUTANEOUS | 1 refills | Status: AC

## 2023-02-02 MED ORDER — HYDROCODONE-ACETAMINOPHEN 5-325 MG PO TABS: 1.0000 | ORAL_TABLET | Freq: Four times a day (QID) | ORAL | 0 refills | Status: DC | PRN

## 2023-02-02 MED ORDER — VANCOMYCIN HCL 1250 MG/250ML IV SOLN
1250.0000 mg | Freq: Two times a day (BID) | INTRAVENOUS | Status: DC
  Administered 2023-02-02: 1250 mg via INTRAVENOUS
  Filled 2023-02-02: qty 250

## 2023-02-02 MED ORDER — SODIUM CHLORIDE 0.9 % IV SOLN
2.0000 g | Freq: Three times a day (TID) | INTRAVENOUS | Status: DC
  Administered 2023-02-02: 2 g via INTRAVENOUS
  Filled 2023-02-02: qty 10

## 2023-02-02 NOTE — ED Notes (Signed)
Pt given 3x blue paper scrubs to wear home.

## 2023-02-02 NOTE — Consult Note (Signed)
Pharmacy Antibiotic Note  Cheryl Chandler is a 57 y.o. female admitted on 02/01/2023 with cellulitis.  Pharmacy has been consulted for vancomycin dosing.  Vancomycin 2000 mg IV x 1 given 10/7 @ 1715  Plan: Start vancomycin 1250 mg IV every 12 hours Estimated AUC 555, Cmin 17 IBW, Scr 0.8, Vd 0.5 (BMI 52) Vancomycin levels as clinically indicated Aztreonam 2 grams IV every 8 hours Follow renal function for further adjustments  Height: 5\' 5"  (165.1 cm) Weight: (!) 142.9 kg (315 lb) IBW/kg (Calculated) : 57  Temp (24hrs), Avg:98.3 F (36.8 C), Min:98.2 F (36.8 C), Max:98.5 F (36.9 C)  Recent Labs  Lab 02/01/23 1338 02/02/23 0457  WBC 8.3 8.2  CREATININE 0.66 0.79    Estimated Creatinine Clearance: 113.3 mL/min (by C-G formula based on SCr of 0.79 mg/dL).    Allergies  Allergen Reactions   Codeine Shortness Of Breath and Rash   Percocet [Oxycodone-Acetaminophen] Shortness Of Breath   Sulfa Antibiotics Shortness Of Breath and Rash   Aspirin Hives   Bee Venom     Bee stings   Darvon [Propoxyphene]     Darvocet-N 100   Latex    Oxycodone Nausea And Vomiting   Penicillins     Has patient had a PCN reaction causing immediate rash, facial/tongue/throat swelling, SOB or lightheadedness with hypotension: Unknown Has patient had a PCN reaction causing severe rash involving mucus membranes or skin necrosis: Unknown Has patient had a PCN reaction that required hospitalization: Unknown Has patient had a PCN reaction occurring within the last 10 years: Unknown If all of the above answers are "NO", then may proceed with Cephalosporin use.    Antimicrobials this admission: vancomycin 10/7 >>  aztreonam 10/7 >>   Dose adjustments this admission: N/A  Microbiology results: 10/7 MRSA PCR: ordered  Thank you for allowing pharmacy to be a part of this patient's care.  Barrie Folk, PharmD 02/02/2023 9:15 AM

## 2023-02-02 NOTE — TOC Initial Note (Signed)
Transition of Care [facility]) - Initial/Assessment Note    Patient Details  Name: Cheryl Chandler MRN: [id number] Date of Birth: Sep 21, 1965  Transition of Care [facility]) CM/SW Contact:    Marquita Palms, LCSW Phone Number: 02/02/2023, 2:03 PM  Clinical Narrative:                  CSW met with patient bedside. Completed TOC consult for home health. CSW reached out to [facility], [facility], [facility], and [facility] for home health, all declined except [facility] for RN and PT will begin in 24 - 48 hours. MD notified and will be discharging patient home. Expected Discharge Plan: Home w Home Health Services Barriers to Discharge: No Barriers Identified   Patient Goals and CMS Choice            Expected Discharge Plan and Services     Post Acute Care Choice: Home Health Living arrangements for the past 2 months: Single Family Home Expected Discharge Date: 02/02/23                         HH Arranged: RN, PT HH Agency: [facility] Date [facility] Agency Contacted: 02/02/23 Time HH Agency Contacted: [phone] Representative spoke with at [facility] Agency: [place] [facility]  Prior Living Arrangements/Services Living arrangements for the past 2 months: Single Family Home Lives with:: Self   Do you feel safe going back to the place where you live?: Yes          Current home services: Home PT, Home RN    Activities of Daily Living   ADL Screening (condition at time of admission) Independently performs ADLs?: No Does the patient have a NEW difficulty with bathing/dressing/toileting/self-feeding that is expected to last >3 days?: No Does the patient have a NEW difficulty with getting in/out of bed, walking, or climbing stairs that is expected to last >3 days?: No Does the patient have a NEW difficulty with communication that is expected to last >3 days?: No Is the patient deaf or have difficulty hearing?: No Does the patient have difficulty seeing, even when wearing  glasses/contacts?: No Does the patient have difficulty concentrating, remembering, or making decisions?: No  Permission Sought/Granted                  Emotional Assessment Appearance:: Disheveled     Orientation: : Oriented to Self, Oriented to Place, Oriented to  Time, Oriented to Situation      Admission diagnosis:  Cellulitis [L03.90] Dermatitis [L30.9] Patient Active Problem List   Diagnosis Date Noted   Dermatitis 02/02/2023   Cellulitis 02/01/2023   Venous ulcer of ankle, left (HCC) 11/16/2022   Moderate episode of recurrent major depressive disorder (HCC) 08/03/2022   Controlled type 2 diabetes mellitus with diabetic polyneuropathy, without long-term current use of insulin (HCC) 08/03/2022   Venous ulcer (HCC) 05/03/2022   Foot pain, right 09/01/2021   Fibrocystic disease of both breasts 05/07/2019   IFG (impaired fasting glucose) 05/03/2019   Chronic venous insufficiency 11/09/2018   Overactive bladder 09/30/2018   Peripheral neuropathy 09/30/2018   Apnea 01/02/2016   Lymphedema 11/30/2014   Dyspnea on exertion 11/30/2014   Anxiety 11/29/2014   Arthritis 11/29/2014   COPD (chronic obstructive pulmonary disease) (HCC) 11/29/2014   Clinical depression 11/29/2014   H/O eating disorder 11/29/2014   Esophageal reflux 11/29/2014   Acne inversa 11/29/2014   Essential hypertension 11/29/2014   HLD (hyperlipidemia) 11/29/2014   Adult hypothyroidism 11/29/2014  Insomnia 11/29/2014   Low back pain 11/29/2014   Morbid obesity (HCC) 11/29/2014   Posttraumatic stress disorder 11/29/2014   Vitamin D deficiency 11/29/2014   Nicotine dependence, cigarettes, uncomplicated 11/29/2014   PCP:  [name], NP Pharmacy:   [facility] - [place], [place] - [address] [place] [address] Phone: [phone] Fax: [phone]     Social Determinants of Health (SDOH) Social History: SDOH Screenings   Food Insecurity: Food Insecurity Present  (02/02/2023)  Housing: Low Risk  (02/02/2023)  Recent Concern: Housing - Medium Risk (01/21/2023)  Transportation Needs: No Transportation Needs (02/02/2023)  Utilities: Not At Risk (02/02/2023)  Alcohol Screen: Low Risk  (07/20/2022)  Depression (PHQ2-9): High Risk (11/09/2022)  Financial Resource Strain: High Risk (05/21/2022)  Physical Activity: Inactive (05/21/2022)  Social Connections: Moderately Integrated (09/18/2022)  Stress: Stress Concern Present (01/21/2023)  Tobacco Use: High Risk (02/02/2023)  Health Literacy: Adequate Health Literacy (12/02/2022)   [name]:     Readmission Risk Interventions     No data to display

## 2023-02-02 NOTE — Consult Note (Signed)
WOC Nurse Consult Note: patient with history of lymphedema and venous ulcers; has been followed by vascular and at one time was having weekly unna boots placed; patient said she could not tolerate these and that they actually "took meat off her legs", patient says she would not be willing to be placed in unna boots again; says she has lymphedema compression wraps at home  Reason for Consult: lower extremity wounds  Wound type: full thickness r/t venous insufficiency  Pressure Injury POA: NA  Measurement: 1.  Multiple scattered venous ulcers L lower leg largest 9 cm x 7 cm x 0.1 cm L lateral leg and 4 cm x 2 cm L anterior lower leg some with soft necrotic tissue, others dry  2.  Multiple scattered venous ulcers R lower leg most around 2 cm x 1 cm most are dry on the R lower leg  Wound bed: as above  Drainage (amount, consistency, odor) moderate amount of serosanguinous L leg; none noted R leg  Periwound: erythema and edema, peeling and hyperkeratotic skin; patient states her left leg is always worse than the right and that holds true at time of this visit; patient being treated for cellulitis  Dressing procedure/placement/frequency: Cleanse bilateral lower legs with Vashe wound cleanser Hart [place] [phone]), cover all open weeping ulcers with silver hydrofiber (Aquacel Coralee [place] [phone]), cover with ABD pads and secure with Kerlix roll gauze beginning just above toes and ending right below knees.  Can also apply Ace bandage if patient can tolerate for some light compression.    POC discussed with bedside nurse.  Patient would benefit from continued outpatient follow-up with vascular surgery.    WOC team will not follow at this time. Re-consult if further needs arise.   Thank you,    Priscella Mann MSN, RN-BC, [employer] [phone]

## 2023-02-02 NOTE — Discharge Summary (Signed)
Physician Discharge Summary   Patient: Cheryl Chandler MRN: [id number]  DOB: [birth year]   Admit:     Date of Admission: 02/01/2023 Admitted from: home   Discharge: Date of discharge: 02/02/23 Disposition: Home Condition at discharge: good  CODE STATUS: FULL CODE     Discharge Physician: Sunnie Nielsen, DO [facility]     PCP: [name], NP  Recommendations for Outpatient Follow-up:  Follow up with PCP [name], NP in 1 weeks Please obtain labs/tests: consider CBC/CMP Please follow up on the following pending results: none PCP AND OTHER OUTPATIENT PROVIDERS: SEE BELOW FOR SPECIFIC DISCHARGE INSTRUCTIONS PRINTED FOR PATIENT IN ADDITION TO GENERIC AVS PATIENT INFO     Discharge Instructions     Call MD for:  severe uncontrolled pain   Complete by: As directed    Call MD for:  temperature >100.4   Complete by: As directed    Diet - low sodium heart healthy   Complete by: As directed    Discharge instructions   Complete by: As directed    Concern for venous stasis dermatitis (inflammation due to worsening chronic swelling), possibly cellulitis (bacterial infection). Covering for infection with antibiotics - if these are NOT helping, the real issue might me dermatitis instead. Either way, treating the dermatitis and lymphedema is very important to avoid complications. You MUST sleep with legs elevated, keep legs elevated during the day as much as possible. Ideally, should be using compression wraps or Unna boots. Keep skin dry and keep ulcers clean with mild soap and lukewarm water, or cleanser as provided by nursing. Try to let these air out by keeping ulcerated skin covered with breathable gauze/wraps, only cream to add is the Aquacel as provided by nursing.  Your blood pressure was a bit low here - see medications, we are holding hydralazine and doxazosin for now, please follow up with your PCP or cardiologist in the next week. (Ideally with  your PCP to check blood pressure and check skin).   Discharge wound care:   Complete by: As directed    o Cleanse bilateral lower legs with Vashe wound cleanser Hart [place] [phone]),   o cover all open weeping ulcers with silver hydrofiber (Aquacel Coralee North [phone])  o cover with ABD pads   o secure with Kerlix roll gauze beginning just above toes and ending right below knees.    o Can also apply Ace bandage if patient can tolerate for some light compression.   Increase activity slowly   Complete by: As directed          Discharge Diagnoses: Principal Problem:   Cellulitis Active Problems:   Dermatitis       Hospital Course:  HPI: Cheryl Chandler is a 57 y.o. female whohas PMH HLD on statin, HTN on doxazosin lasix hydralazine lisinopril spironolactone lasix edema on lasix, anxiety/depression on clexa and hydroxyzine, DM2 on trulicity and neuropathy on gabapentin, hypothryoid on synthroid, GERD on omeprazole, asthma/COPD on prn albuterol trelegy and singulair, on home O2 3L, hx anaphylaxis to penicillin .   Presented to ED 02/01/23 w/ cc LE edema/pain and redness on L leg, reports recently Rx abx but unsure name and nothing in the EHR to reference. She has been sleeping in her wheelchair recently instead of her recliner, not elevating legs. Not elevating legs much. Poor historian, "I go to a lot of doctors all the time." No fever/chills. Legs feel painful and almost itching sensation, worse on L   Records  reviewed: last outpatient visit w/ Dr Gilda Crease (vascular) was 11/16/22. Venous ulceration no open sores/wounds, has been using lymphedema pump, started on Unna boots to change weekly.     Hospital course / significant events:  10/07: to ED. Concern for cellulitis, not septic. Keeping for iv abx and needs to elevate legs, if redness improves w/ leg elevation unlikely to be infectious  10/08: some improvement overnight. Pharmacy confirmed was NOT on abx at home, will d/c on po  abx and close follow-up, especially since no sepsis. Extensive education on appropriate measures to treat lymphedema - see pt instructions   Consultants:  none  Procedures/Surgeries: none      ASSESSMENT & PLAN:   Erythema and pain question cellulitis vs lymphedema / venous stasis dermatitis  Venous stasis ulcers  Leg elevation above level of the heart  Wound care as below Continue abx for now - severe PCN allergy, dc on doxycycline  Pharmacy confirmed NO recent abx dispenses - therefore patient has not failed po therapy   Venous stasis ulceration  Dressing procedure/placement/frequency: Cleanse bilateral lower legs with Vashe wound cleanser Hart [place] [phone]),  cover all open weeping ulcers with silver hydrofiber (Aquacel Coralee North [phone]) cover with ABD pads  secure with Kerlix roll gauze beginning just above toes and ending right below knees.   Can also apply Ace bandage if patient can tolerate for some light compression.   Follow w/ vascular outpatient Pt declined Unna boots Home health RN ordered   HLD  Continue statin  HTN  Per last cardiology note - d/c amlodipine and started cardura/doxazosin Otherwise home meds  Spironolactone 12.5 mg daily  Lisinopril 40 mg dialy  Hydralazine 50 mg tid  Lasix 20 mg prn edema  BP on low side this morning but no symptoms - given morbid obesity and unable to get bP on the lower extremity d/t skin breakdown, question accuracy of low readings, pt is not symptomatic Hold hydralazine and cardura on d/c and follow w/ cardiology   Anxiety/depression  Continue Celexa and hydroxyzine,   DM2  On trulicity   Neuropathy  Continue gabapentin,  Hypothryoid  Continue synthroid  GERD  Continue PPI  Asthma/COPD  Chronic hypoxic respiratory failure on home O2 3L prn albuterol  Continue trelegy and singulair O2 via [place]           Discharge Instructions  Allergies as of 02/02/2023       Reactions   Codeine Shortness Of  Breath, Rash   Percocet [oxycodone-acetaminophen] Shortness Of Breath   Sulfa Antibiotics Shortness Of Breath, Rash   Aspirin Hives   Bee Venom    Bee stings   Darvon [propoxyphene]    Darvocet-N 100   Latex    Oxycodone Nausea And Vomiting   Penicillins    Has patient had a PCN reaction causing immediate rash, facial/tongue/throat swelling, SOB or lightheadedness with hypotension: Unknown Has patient had a PCN reaction causing severe rash involving mucus membranes or skin necrosis: Unknown Has patient had a PCN reaction that required hospitalization: Unknown Has patient had a PCN reaction occurring within the last 10 years: Unknown If all of the above answers are "NO", then may proceed with Cephalosporin use.        Medication List     STOP taking these medications    doxazosin 1 MG tablet Commonly known as: Cardura   hydrALAZINE 50 MG tablet Commonly known as: APRESOLINE       TAKE these medications    Accu-Chek Guide test  strip Generic drug: glucose blood USE TO CHECK BLOOD SUGAR 3 TIMES DAILY AS DIRECTED   Accu-Chek Guide w/Device Kit Use to check blood sugar 3 times a day.   Accu-Chek Softclix Lancets lancets CHECK BLOOD SUGAR TWICE DAILY   albuterol (2.5 MG/3ML) 0.083% nebulizer solution Commonly known as: PROVENTIL USE 1 VIAL VIA NEBULIZER EVERY 4 HOURS AS NEEDED   Aquacel Ag Foam 8"X8" Pads Apply to areas of ulceration on legs as directed   atorvastatin 80 MG tablet Commonly known as: LIPITOR Take 1 tablet (80 mg total) by mouth daily.   citalopram 40 MG tablet Commonly known as: CELEXA Take 1 tablet (40 mg total) by mouth daily.   diphenhydrAMINE 25 mg capsule Commonly known as: BENADRYL Take 25 mg by mouth every 6 (six) hours as needed for itching. Taking once a day   doxycycline 100 MG tablet Commonly known as: VIBRA-TABS Take 1 tablet (100 mg total) by mouth 2 (two) times daily.   EPINEPHrine 0.3 mg/0.3 mL Soaj injection Commonly known  as: EPI-PEN INJECT 1 SYRINGE INTO OUTER THIGH ONCE AS NEEDED FOR SEVERE ALLERGIC REACTION.   furosemide 20 MG tablet Commonly known as: LASIX Take 1 tablet (20 mg total) by mouth 2 (two) times daily.   gabapentin 300 MG capsule Commonly known as: NEURONTIN TAKE 1 CAPSULE BY MOUTH 3 TIMES DAILY ASNEEDED (NEED OFFICE VISIT FOR FURTHER REFILLS) What changed: See the new instructions.   HYDROcodone-acetaminophen 5-325 MG tablet Commonly known as: NORCO/VICODIN Take 1-2 tablets by mouth every 6 (six) hours as needed for moderate pain or severe pain.   hydrOXYzine 25 MG capsule Commonly known as: VISTARIL Take 1 capsule (25 mg total) by mouth every 8 (eight) hours as needed.   levothyroxine 50 MCG tablet Commonly known as: SYNTHROID TAKE 1 TABLET BY MOUTH ONCE DAILY ON AN EMPTY STOMACH. WAIT 30 MINUTES BEFORE TAKING OTHER MEDS.   lisinopril 40 MG tablet Commonly known as: ZESTRIL TAKE 1 TABLET BY MOUTH ONCE DAILY   meloxicam 15 MG tablet Commonly known as: MOBIC TAKE 1 TABLET BY MOUTH ONCE DAILY AS NEEDED WITH FOOD OR MILK   montelukast 10 MG tablet Commonly known as: SINGULAIR TAKE 1 TABLET BY MOUTH AT BEDTIME   nitroGLYCERIN 0.4 MG SL tablet Commonly known as: NITROSTAT DISSOLVE 1 TABLET UNDER TONGUE AT ONSET OF CHEST PAIN. REPEAT IN 5 MIN IF NOT RESOLVED, MAX 3 DOSES. 911 IF NEEDED.   omeprazole 20 MG capsule Commonly known as: PRILOSEC TAKE 1 CAPSULE BY MOUTH ONCE DAILY   Omron 3 Series BP Monitor Devi Use to check blood pressure as directed   OXYGEN Inhale 3 L into the lungs daily.   spironolactone 25 MG tablet Commonly known as: ALDACTONE Take 0.5 tablets (12.5 mg total) by mouth daily.   Trelegy Ellipta 100-62.5-25 MCG/ACT Aepb Generic drug: Fluticasone-Umeclidin-Vilant Inhale 1 puff into the lungs daily.   Trulicity 0.75 MG/0.5ML Sopn Generic drug: Dulaglutide 0.75 mg by ultrasound guided injection route once a week.   Vashe Wound 0.033 % Soln Apply 1  Application topically daily. Use as directed to cleanse lower legs skin   Vitamin D (Ergocalciferol) 1.25 MG (50000 UNIT) Caps capsule Commonly known as: DRISDOL TAKE 1 CAPSULE BY MOUTH EVERY 7 DAYS               Discharge Care Instructions  (From admission, onward)           Start     Ordered   02/02/23 0000  Discharge wound  care:       Comments: o Cleanse bilateral lower legs with Vashe wound cleanser Hart [place] [phone]),   o cover all open weeping ulcers with silver hydrofiber (Aquacel [place]e [place] [phone])  o cover with ABD pads   o secure with Kerlix roll gauze beginning just above toes and ending right below knees.    o Can also apply Ace bandage if patient can tolerate for some light compression.   02/02/23 1251             Follow-up Information     Schnier, Latina Craver, MD. Schedule an appointment as soon as possible for a visit.   Specialties: Vascular Surgery, Cardiology, Radiology, Vascular Surgery Why: hospital follow up - venous stasis Contact information: [address] [place] [address] [phone]         [name], NP. Schedule an appointment as soon as possible for a visit.   Specialty: Nurse Practitioner Why: appt ASAP for hospital follow up (skin check, BP check) Contact information: [address] [place] [address] [phone]                 Allergies  Allergen Reactions   Codeine Shortness Of Breath and Rash   Percocet [Oxycodone-Acetaminophen] Shortness Of Breath   Sulfa Antibiotics Shortness Of Breath and Rash   Aspirin Hives   Bee Venom     Bee stings   Darvon [Propoxyphene]     Darvocet-N 100   Latex    Oxycodone Nausea And Vomiting   Penicillins     Has patient had a PCN reaction causing immediate rash, facial/tongue/throat swelling, SOB or lightheadedness with hypotension: Unknown Has patient had a PCN reaction causing severe rash involving mucus membranes or skin necrosis:  Unknown Has patient had a PCN reaction that required hospitalization: Unknown Has patient had a PCN reaction occurring within the last 10 years: Unknown If all of the above answers are "NO", then may proceed with Cephalosporin use.     Subjective: pt reports pain and swelling slightly better. Legs still hurt    Discharge Exam: BP (!) 86/45   Pulse 76   Temp 98.3 F (36.8 C) (Oral)   Resp 19   Ht 5\' 5"  (1.651 m)   Wt (!) 142.9 kg   LMP 06/09/2017 (Approximate)   SpO2 93%   BMI 52.42 kg/m  General: Pt is alert, awake, not in acute distress Cardiovascular: RRR, S1/S2 +, no rubs, no gallops Respiratory: CTA bilaterally, no wheezing, no rhonchi Abdominal: Soft, NT, ND, bowel sounds  Extremities: significant lyphedema bilateral LE. no cyanosis. Removed bandage on LLE, erythema appears very slightly improved.      The results of significant diagnostics from this hospitalization (including imaging, microbiology, ancillary and laboratory) are listed below for reference.     Microbiology: No results found for this or any previous visit (from the past 240 hour(s)).   Labs: BNP (last 3 results) No results for input(s): "BNP" in the last 8760 hours. Basic Metabolic Panel: Recent Labs  Lab 02/01/23 1338 02/02/23 0457  NA 139 138  K 4.1 4.0  CL 106 106  CO2 24 26  GLUCOSE 147* 116*  BUN 23* 25*  CREATININE 0.66 0.79  CALCIUM 8.9 8.2*   Liver Function Tests: No results for input(s): "AST", "ALT", "ALKPHOS", "BILITOT", "PROT", "ALBUMIN" in the last 168 hours. No results for input(s): "LIPASE", "AMYLASE" in the last 168 hours. No results for input(s): "AMMONIA" in the last 168 hours. CBC:  Recent Labs  Lab 02/01/23 1338 02/02/23 0457  WBC 8.3 8.2  HGB 11.7* 10.8*  HCT 35.7* 33.9*  MCV 84.2 86.3  PLT 386 364   Cardiac Enzymes: No results for input(s): "CKTOTAL", "CKMB", "CKMBINDEX", "TROPONINI" in the last 168 hours. BNP: Invalid input(s): "POCBNP" CBG: No  results for input(s): "GLUCAP" in the last 168 hours. D-Dimer No results for input(s): "DDIMER" in the last 72 hours. Hgb A1c No results for input(s): "HGBA1C" in the last 72 hours. Lipid Profile No results for input(s): "CHOL", "HDL", "LDLCALC", "TRIG", "CHOLHDL", "LDLDIRECT" in the last 72 hours. Thyroid function studies No results for input(s): "TSH", "T4TOTAL", "T3FREE", "THYROIDAB" in the last 72 hours.  Invalid input(s): "FREET3" Anemia work up No results for input(s): "VITAMINB12", "FOLATE", "FERRITIN", "TIBC", "IRON", "RETICCTPCT" in the last 72 hours. Urinalysis    Component Value Date/Time   COLORURINE STRAW (A) 02/05/2021 1420   APPEARANCEUR Cloudy (A) 06/04/2021 1428   LABSPEC 1.006 02/05/2021 1420   LABSPEC 1.025 08/05/2014 1306   PHURINE 7.0 02/05/2021 1420   GLUCOSEU Negative 06/04/2021 1428   GLUCOSEU Negative 08/05/2014 1306   HGBUR NEGATIVE 02/05/2021 1420   BILIRUBINUR Negative 06/04/2021 1428   BILIRUBINUR Negative 08/05/2014 1306   KETONESUR NEGATIVE 02/05/2021 1420   PROTEINUR Negative 06/04/2021 1428   PROTEINUR NEGATIVE 02/05/2021 1420   NITRITE Negative 06/04/2021 1428   NITRITE NEGATIVE 02/05/2021 1420   LEUKOCYTESUR Negative 06/04/2021 1428   LEUKOCYTESUR NEGATIVE 02/05/2021 1420   LEUKOCYTESUR Negative 08/05/2014 1306   Sepsis Labs Recent Labs  Lab 02/01/23 1338 02/02/23 0457  WBC 8.3 8.2   Microbiology No results found for this or any previous visit (from the past 240 hour(s)). Imaging No results found.    Time coordinating discharge: over 30 minutes  SIGNED:  Sunnie Nielsen DO [facility]

## 2023-02-03 DIAGNOSIS — N3281 Overactive bladder: Secondary | ICD-10-CM | POA: Diagnosis not present

## 2023-02-03 DIAGNOSIS — Z993 Dependence on wheelchair: Secondary | ICD-10-CM | POA: Diagnosis not present

## 2023-02-03 DIAGNOSIS — R3981 Functional urinary incontinence: Secondary | ICD-10-CM | POA: Diagnosis not present

## 2023-02-04 DIAGNOSIS — I10 Essential (primary) hypertension: Secondary | ICD-10-CM | POA: Diagnosis not present

## 2023-02-04 DIAGNOSIS — J9611 Chronic respiratory failure with hypoxia: Secondary | ICD-10-CM | POA: Diagnosis not present

## 2023-02-04 DIAGNOSIS — J4489 Other specified chronic obstructive pulmonary disease: Secondary | ICD-10-CM | POA: Diagnosis not present

## 2023-02-04 DIAGNOSIS — E785 Hyperlipidemia, unspecified: Secondary | ICD-10-CM | POA: Diagnosis not present

## 2023-02-04 DIAGNOSIS — Z9981 Dependence on supplemental oxygen: Secondary | ICD-10-CM | POA: Diagnosis not present

## 2023-02-04 DIAGNOSIS — F419 Anxiety disorder, unspecified: Secondary | ICD-10-CM | POA: Diagnosis not present

## 2023-02-04 DIAGNOSIS — E039 Hypothyroidism, unspecified: Secondary | ICD-10-CM | POA: Diagnosis not present

## 2023-02-04 DIAGNOSIS — K219 Gastro-esophageal reflux disease without esophagitis: Secondary | ICD-10-CM | POA: Diagnosis not present

## 2023-02-04 DIAGNOSIS — E1140 Type 2 diabetes mellitus with diabetic neuropathy, unspecified: Secondary | ICD-10-CM | POA: Diagnosis not present

## 2023-02-04 DIAGNOSIS — F32A Depression, unspecified: Secondary | ICD-10-CM | POA: Diagnosis not present

## 2023-02-04 DIAGNOSIS — I890 Lymphedema, not elsewhere classified: Secondary | ICD-10-CM | POA: Diagnosis not present

## 2023-02-04 DIAGNOSIS — Z6841 Body Mass Index (BMI) 40.0 and over, adult: Secondary | ICD-10-CM | POA: Diagnosis not present

## 2023-02-08 ENCOUNTER — Other Ambulatory Visit: Payer: Self-pay

## 2023-02-08 ENCOUNTER — Emergency Department
Admission: EM | Admit: 2023-02-08 | Discharge: 2023-02-08 | Disposition: A | Discharge status: Home / Self Care | Payer: Medicaid Other | Attending: Emergency Medicine | Admitting: Emergency Medicine

## 2023-02-08 DIAGNOSIS — M79604 Pain in right leg: Secondary | ICD-10-CM | POA: Insufficient documentation

## 2023-02-08 DIAGNOSIS — L03115 Cellulitis of right lower limb: Secondary | ICD-10-CM | POA: Insufficient documentation

## 2023-02-08 DIAGNOSIS — M79605 Pain in left leg: Secondary | ICD-10-CM | POA: Insufficient documentation

## 2023-02-08 DIAGNOSIS — L03116 Cellulitis of left lower limb: Secondary | ICD-10-CM | POA: Diagnosis not present

## 2023-02-08 DIAGNOSIS — L03119 Cellulitis of unspecified part of limb: Secondary | ICD-10-CM

## 2023-02-08 LAB — CBC
HCT: 38.7 % (ref 36.0–46.0)
Hemoglobin: 12.4 g/dL (ref 12.0–15.0)
MCH: 27.7 pg (ref 26.0–34.0)
MCHC: 32 g/dL (ref 30.0–36.0)
MCV: 86.6 fL (ref 80.0–100.0)
Platelets: 365 10*3/uL (ref 150–400)
RBC: 4.47 MIL/uL (ref 3.87–5.11)
RDW: 15.9 % — ABNORMAL HIGH (ref 11.5–15.5)
WBC: 7.1 10*3/uL (ref 4.0–10.5)
nRBC: 0 % (ref 0.0–0.2)

## 2023-02-08 LAB — BASIC METABOLIC PANEL
Anion gap: 10 (ref 5–15)
BUN: 26 mg/dL — ABNORMAL HIGH (ref 6–20)
CO2: 27 mmol/L (ref 22–32)
Calcium: 9.1 mg/dL (ref 8.9–10.3)
Chloride: 101 mmol/L (ref 98–111)
Creatinine, Ser: 0.65 mg/dL (ref 0.44–1.00)
GFR, Estimated: >60 mL/min (ref >60)
Glucose, Bld: 109 mg/dL — ABNORMAL HIGH (ref 70–99)
Potassium: 4.1 mmol/L (ref 3.5–5.1)
Sodium: 138 mmol/L (ref 135–145)

## 2023-02-08 LAB — LACTIC ACID, PLASMA: Lactic Acid, Venous: 1.2 mmol/L (ref 0.5–1.9)

## 2023-02-08 MED ORDER — CLINDAMYCIN HCL 150 MG PO CAPS: 450.0000 mg | ORAL_CAPSULE | Freq: Three times a day (TID) | ORAL | 0 refills | Status: AC

## 2023-02-08 MED ORDER — ONDANSETRON 4 MG PO TBDP
8.0000 mg | ORAL_TABLET | Freq: Once | ORAL | Status: AC
  Administered 2023-02-08: 8 mg via ORAL
  Filled 2023-02-08: qty 2

## 2023-02-08 NOTE — ED Provider Notes (Signed)
[facility] Provider Note   Event Date/Time   First MD Initiated Contact with Patient 02/08/23 1251     (approximate) History  Leg Pain  HPI Cheryl Chandler is a 57 y.o. female with a past medical history of lymphedema who presents complaining of worsening weeping to bilateral lower extremities as well as worsening pain.  Patient states that she is also having intermittent hot flashes in her legs that she refers to as "fever in my legs".  Patient states that she has gotten her antibiotic and pain medicine that was prescribed 2 days ago but denies any improvement in her symptoms. ROS: Patient currently denies any vision changes, tinnitus, difficulty speaking, facial droop, sore throat, chest pain, shortness of breath, abdominal pain, nausea/vomiting/diarrhea, dysuria, or weakness/numbness/paresthesias in any extremity   Physical Exam  Triage Vital Signs: ED Triage Vitals  Encounter Vitals Group     BP 02/08/23 1100 135/78     Systolic BP Percentile --      Diastolic BP Percentile --      Pulse Rate 02/08/23 1100 80     Resp 02/08/23 1100 18     Temp 02/08/23 1100 98 F (36.7 C)     Temp src --      SpO2 02/08/23 1100 95 %     Weight --      Height --      Head Circumference --      Peak Flow --      Pain Score 02/08/23 1032 8     Pain Loc --      Pain Education --      Exclude from Growth Chart --    Most recent vital signs: Vitals:   02/08/23 1100  BP: 135/78  Pulse: 80  Resp: 18  Temp: 98 F (36.7 C)  SpO2: 95%   General: Awake, oriented x4. CV:  Good peripheral perfusion.  Resp:  Normal effort.  Abd:  No distention.  Other:  Middle-aged morbidly obese Caucasian female resting comfortably in no acute distress ED Results / Procedures / Treatments  Labs (all labs ordered are listed, but only abnormal results are displayed) Labs Reviewed  CBC - Abnormal; Notable for the following components:      Result Value   RDW 15.9 (*)    All  other components within normal limits  BASIC METABOLIC PANEL - Abnormal; Notable for the following components:   Glucose, Bld 109 (*)    BUN 26 (*)    All other components within normal limits  LACTIC ACID, PLASMA  LACTIC ACID, PLASMA   PROCEDURES: Critical Care performed: No .1-3 Lead EKG Interpretation  Performed by: Merwyn Katos, MD Authorized by: Merwyn Katos, MD     Interpretation: normal     ECG rate:  71   ECG rate assessment: normal     Rhythm: sinus rhythm     Ectopy: none     Conduction: normal    MEDICATIONS ORDERED IN ED: Medications  ondansetron (ZOFRAN-ODT) disintegrating tablet 8 mg (8 mg Oral Given 02/08/23 1419)   IMPRESSION / MDM / ASSESSMENT AND PLAN / ED COURSE  I reviewed the triage vital signs and the nursing notes.                             The patient is on the cardiac monitor to evaluate for evidence of arrhythmia and/or significant heart rate changes. Patient's presentation  is most consistent with acute presentation with potential threat to life or bodily function. Presentation most consistent with simple cellulitis. Given History, Exam, and Workup I have low suspicion for Necrotizing Fasciitis, Abscess, Osteomyelitis, DVT or other emergent problem as a cause for this presentation.  Rx: Clindamycin twice daily x5 days  Disposition: Discharge. No evidence of serious bacterial illness. Nontoxic appearing, VSS. Low risk for treatment failure based on history. Strict return precautions discussed with patient with full understanding. Advised patient to follow up promptly with primary care provider within next 48 hours.   FINAL CLINICAL IMPRESSION(S) / ED DIAGNOSES   Final diagnoses:  Bilateral leg pain  Cellulitis of lower extremity, unspecified laterality   Rx / DC Orders   ED Discharge Orders          Ordered    clindamycin (CLEOCIN) 150 MG capsule  3 times daily        02/08/23 1459           Note:  This document was prepared  using Dragon voice recognition software and may include unintentional dictation errors.   Merwyn Katos, MD 02/08/23 [id number]

## 2023-02-08 NOTE — ED Triage Notes (Addendum)
Pt comes with c/o bilateral leg pain and blisters. Pt was seen here on the 7th for the same. Pt states it is getting worse.  Pt states she was able to get the pain pill and antibiotic that was prescribed but not the other two things.

## 2023-02-09 ENCOUNTER — Encounter: Payer: Self-pay | Admitting: Nurse Practitioner

## 2023-02-09 ENCOUNTER — Ambulatory Visit: Payer: Medicaid Other | Admitting: Nurse Practitioner

## 2023-02-09 VITALS — BP 133/74 | HR 67 | Temp 97.7°F

## 2023-02-09 DIAGNOSIS — F331 Major depressive disorder, recurrent, moderate: Secondary | ICD-10-CM

## 2023-02-09 DIAGNOSIS — E782 Mixed hyperlipidemia: Secondary | ICD-10-CM

## 2023-02-09 DIAGNOSIS — I10 Essential (primary) hypertension: Secondary | ICD-10-CM | POA: Diagnosis not present

## 2023-02-09 DIAGNOSIS — I83023 Varicose veins of left lower extremity with ulcer of ankle: Secondary | ICD-10-CM | POA: Diagnosis not present

## 2023-02-09 DIAGNOSIS — Z23 Encounter for immunization: Secondary | ICD-10-CM | POA: Diagnosis not present

## 2023-02-09 DIAGNOSIS — E1142 Type 2 diabetes mellitus with diabetic polyneuropathy: Secondary | ICD-10-CM

## 2023-02-09 DIAGNOSIS — J449 Chronic obstructive pulmonary disease, unspecified: Secondary | ICD-10-CM | POA: Diagnosis not present

## 2023-02-09 DIAGNOSIS — E559 Vitamin D deficiency, unspecified: Secondary | ICD-10-CM

## 2023-02-09 DIAGNOSIS — L0390 Cellulitis, unspecified: Secondary | ICD-10-CM

## 2023-02-09 DIAGNOSIS — Z599 Problem related to housing and economic circumstances, unspecified: Secondary | ICD-10-CM

## 2023-02-09 DIAGNOSIS — I872 Venous insufficiency (chronic) (peripheral): Secondary | ICD-10-CM | POA: Diagnosis not present

## 2023-02-09 DIAGNOSIS — E039 Hypothyroidism, unspecified: Secondary | ICD-10-CM

## 2023-02-09 DIAGNOSIS — L97329 Non-pressure chronic ulcer of left ankle with unspecified severity: Secondary | ICD-10-CM

## 2023-02-09 NOTE — Progress Notes (Unsigned)
BP 133/74   Pulse 67   Temp 97.7 F (36.5 C) (Oral)   LMP 06/09/2017 (Approximate)   SpO2 94%    Subjective:    Patient ID: Cheryl Chandler, female    DOB: [birth year], 57 y.o.   MRN: [id number]  HPI: Cheryl Chandler is a 57 y.o. female  Chief Complaint  Patient presents with   hosptialization follow up   HYPERTENSION / HYPERLIPIDEMIA Satisfied with current treatment? yes Duration of hypertension: years BP monitoring frequency: daily BP range: 130/80 BP medication side effects: no Past BP meds:  lasix and lisinopril, hydralazine, spirnloactone  Duration of hyperlipidemia: years Cholesterol medication side effects: no Cholesterol supplements: none Past cholesterol medications: simvastatin (zocor) Medication compliance: excellent compliance Aspirin: no Recent stressors: no Recurrent headaches: no Visual changes: no Palpitations: no Dyspnea: yes Chest pain: no Lower extremity edema: yes Dizzy/lightheaded: no Following with the lymphedema specialist. No able to use her leg pumps due to wound on her leg. She is following up with vascular.  COPD She is followed by Dr. Jayme Cloud with Pulmonology.  She is able to use it portable.  She is not using it in office today.   COPD status: Uncontrolled Satisfied with current treatment?: yes Oxygen use: yes- 3L  Dyspnea frequency: yes Cough frequency: daily Rescue inhaler frequency: has cut back from 4 times daily Limitation of activity: yes Productive cough:  Last Spirometry:  Pneumovax: Up to Date Influenza: Up to Date  HYPOTHYROIDISM Thyroid control status:controlled Satisfied with current treatment? yes Medication side effects: no Medication compliance: excellent compliance Etiology of hypothyroidism:  Recent dose adjustment:no Fatigue: yes Cold intolerance: no Heat intolerance: no Weight gain: no Weight loss: no Constipation: no Diarrhea/loose stools: no Palpitations: no Lower extremity edema:  yes Anxiety/depressed mood: no  DEPRESSION/ANXIETY Patient states she feels like her mood is doing okay.  She has been stressed lately about money.  States it has been difficult to make their bills.  She worries and feels like her stress just keeps building.    Flowsheet Row Office Visit from 02/09/2023 in [facility]  PHQ-9 Total Score 19         02/09/2023    2:53 PM 11/09/2022    1:17 PM 08/03/2022    1:45 PM 05/01/2022   10:30 AM  GAD 7 : Generalized Anxiety Score  Nervous, Anxious, on Edge 3 3 3 2   Control/stop worrying 3 3 3 3   Worry too much - different things 3 3 3 3   Trouble relaxing 3 3 3 2   Restless 3 0 0 2  Easily annoyed or irritable 3 2 0 2  Afraid - awful might happen 3 0 2 2  Total GAD 7 Score 21 14 14 16   Anxiety Difficulty Somewhat difficult Extremely difficult Somewhat difficult Somewhat difficult    DIABETES Has been out of her medications on and off.  She is having trouble getting her medications due to her financial status.  She is back on the Trulicity.   Hypoglycemic episodes:no Polydipsia/polyuria: no Visual disturbance: no Chest pain: no Paresthesias: no Glucose Monitoring: no  Accucheck frequency: Not Checking  Fasting glucose:  Post prandial:  Evening:  Before meals: Taking Insulin?: no  Long acting insulin:  Short acting insulin: Blood Pressure Monitoring: daily Retinal Examination: Up to Date Foot Exam: Up to Date Diabetic Education: Not Completed Pneumovax: Up to Date Influenza: Up to Date Aspirin: no  CELLULITIS Patient states she was seen in the ED yesterday.  On Doxycyline.  Endorses picking up medication and taking it at home.  Needs a hospital bed due to not being able to get up and down out of the bed.  Only able to sit in her wheelchair.  She is currently sleeping in the wheelchair.  Also needs a bedside commode due to inability to get up and down off toilet.  Relevant past medical, surgical, family and  social history reviewed and updated as indicated. Interim medical history since our last visit reviewed. Allergies and medications reviewed and updated.  Review of Systems  Constitutional:  Negative for fever and unexpected weight change.  Eyes:  Negative for visual disturbance.  Respiratory:  Positive for shortness of breath. Negative for cough and chest tightness.   Cardiovascular:  Negative for chest pain, palpitations and leg swelling.  Endocrine: Negative for cold intolerance.  Skin:        LLE wound  Neurological:  Negative for dizziness and headaches.  Psychiatric/Behavioral:  Positive for dysphoric mood. Negative for suicidal ideas. The patient is nervous/anxious.     Per HPI unless specifically indicated above     Objective:    BP 133/74   Pulse 67   Temp 97.7 F (36.5 C) (Oral)   LMP 06/09/2017 (Approximate)   SpO2 94%   Wt Readings from Last 3 Encounters:  02/01/23 (!) 315 lb (142.9 kg)  12/18/22 (!) 326 lb (147.9 kg)  05/12/22 280 lb (127 kg)    Physical Exam Vitals and nursing note reviewed.  Constitutional:      General: She is not in acute distress.    Appearance: Normal appearance. She is normal weight. She is not ill-appearing, toxic-appearing or diaphoretic.  HENT:     Head: Normocephalic.     Right Ear: External ear normal.     Left Ear: External ear normal.     Nose: Nose normal.     Mouth/Throat:     Mouth: Mucous membranes are moist.     Pharynx: Oropharynx is clear.  Eyes:     General:        Right eye: No discharge.        Left eye: No discharge.     Extraocular Movements: Extraocular movements intact.     Conjunctiva/sclera: Conjunctivae normal.     Pupils: Pupils are equal, round, and reactive to light.  Cardiovascular:     Rate and Rhythm: Normal rate and regular rhythm.     Heart sounds: No murmur heard. Pulmonary:     Effort: Pulmonary effort is normal. No respiratory distress.     Breath sounds: Decreased breath sounds present. No  wheezing or rales.  Musculoskeletal:     Cervical back: Normal range of motion and neck supple.     Right lower leg: No edema.     Left lower leg: No edema.     Comments: In her wheelchair- not able to get out of chair  Skin:    General: Skin is warm and dry.     Capillary Refill: Capillary refill takes less than 2 seconds.     Findings: Rash present. Rash is papular.  Neurological:     General: No focal deficit present.     Mental Status: She is alert and oriented to person, place, and time. Mental status is at baseline.  Psychiatric:        Mood and Affect: Mood normal.        Behavior: Behavior normal.        Thought Content:  Thought content normal.        Judgment: Judgment normal.     Results for orders placed or performed during the hospital encounter of 02/08/23  CBC  Result Value Ref Range   WBC 7.1 4.0 - 10.5 K/uL   RBC 4.47 3.87 - 5.11 MIL/uL   Hemoglobin 12.4 12.0 - 15.0 g/dL   HCT 78.2 95.6 - 21.3 %   MCV 86.6 80.0 - 100.0 fL   MCH 27.7 26.0 - 34.0 pg   MCHC 32.0 30.0 - 36.0 g/dL   RDW 08.6 (H) 57.8 - 46.9 %   Platelets 365 150 - 400 K/uL   nRBC 0.0 0.0 - 0.2 %  Basic metabolic panel  Result Value Ref Range   Sodium 138 135 - 145 mmol/L   Potassium 4.1 3.5 - 5.1 mmol/L   Chloride 101 98 - 111 mmol/L   CO2 27 22 - 32 mmol/L   Glucose, Bld 109 (H) 70 - 99 mg/dL   BUN 26 (H) 6 - 20 mg/dL   Creatinine, Ser 6.29 0.44 - 1.00 mg/dL   Calcium 9.1 8.9 - 52.8 mg/dL   GFR, Estimated >41 >32 mL/min   Anion gap 10 5 - 15  Lactic acid, plasma  Result Value Ref Range   Lactic Acid, Venous 1.2 0.5 - 1.9 mmol/L      Assessment & Plan:   Problem List Items Addressed This Visit       Cardiovascular and Mediastinum   Essential hypertension    Chronic. Improved.   Saw Cardiology in August.  Was placed on Doxasosin 1mg  BID.  Continue with medications- hydralazine, spironolactone, lisinopril and Lasix.  Referral placed for Advanced HTN clinic.  Labs ordered today.   Follow up in 1 months.  Continue to check blood pressures at home.       Relevant Orders   Comp Met (CMET)   Chronic venous insufficiency    Chronic. Not well controlled.  Continues to struggle with venous ulcers.  Not able to tolerate leg pumps.  Follows up with vascular.  Endorses taking the doxycyline prescribed for recent cellulitis.         Respiratory   COPD (chronic obstructive pulmonary disease) (HCC) - Primary    Chronic.  On 3L of O2 at home.  Not wearing oxygen in office today.  Working with Pulmonology.  Recommend getting portable oxygen to take with her when she goes places.  She is not able to take the large tank with her.       Relevant Orders   Ambulatory referral to Home Health   For home use only DME Hospital bed   DME Bedside commode     Endocrine   Adult hypothyroidism    Chronic.  Controlled.  Continue with current medication regimen.  Labs ordered today.  Return to clinic in 6 months for reevaluation.  Call sooner if concerns arise.       Relevant Orders   TSH   T4, free   Controlled type 2 diabetes mellitus with diabetic polyneuropathy, without long-term current use of insulin (HCC)    Chronic.  Controlled.  Continue with current medication regimen.  She has restarted Trulicity.  Continues to have financial troubles.  Referral placed for social work.  Labs ordered today.  Return to clinic in 6 months for reevaluation.  Call sooner if concerns arise.        Relevant Orders   HgB A1c     Musculoskeletal and Integument  Venous ulcer of ankle, left (HCC)    Ongoing issue.  Discharged from the ED yesterday.  Endorses taking Doxycyline.  States she has home health coming in but not sure who ordered it for her.  I have placed a referral to ensure home health does see her.  She needs a hospital bed due to immobility.  She is currently sleeping in her wheelchair because she can't get in and out of her bed.      Relevant Orders   Ambulatory referral to Home  Health   For home use only DME Hospital bed     Other   HLD (hyperlipidemia)    Labs ordered at visit today.  Will make recommendations based on lab results.        Relevant Orders   Lipid Profile   Morbid obesity (HCC)    Recommended eating smaller high protein, low fat meals more frequently and exercising 30 mins a day 5 times a week with a goal of 10-15lb weight loss in the next 3 months.       Relevant Orders   For home use only DME Hospital bed   Vitamin D deficiency    Labs ordered at visit today.  Will make recommendations based on lab results.        Moderate episode of recurrent major depressive disorder (HCC)    Chronic. Not well controlled.  Referral placed for social work to help with grief counseling.  Continue with current medication regimen.       Cellulitis    Ongoing issue.  Discharged from the ED yesterday.  Endorses taking Doxycyline.  States she has home health coming in but not sure who ordered it for her.  I have placed a referral to ensure home health does see her.  She needs a hospital bed due to immobility.  She is currently sleeping in her wheelchair because she can't get in and out of her bed.      Relevant Orders   CBC w/Diff   Other Visit Diagnoses     Financial difficulties       Referral placed for social work to see if any assistance is able to be provided.   Relevant Orders   AMB Referral [facility] Care Management   Need for influenza vaccination       Relevant Orders   Flu vaccine trivalent PF, 6mos and older(Flulaval,Afluria,Fluarix,Fluzone) (Completed)         Follow up plan: No follow-ups on file.

## 2023-02-10 ENCOUNTER — Ambulatory Visit (INDEPENDENT_AMBULATORY_CARE_PROVIDER_SITE_OTHER): Payer: Medicaid Other | Admitting: Nurse Practitioner

## 2023-02-10 NOTE — Assessment & Plan Note (Signed)
Labs ordered at visit today.  Will make recommendations based on lab results.   

## 2023-02-10 NOTE — Assessment & Plan Note (Signed)
Ongoing issue.  Discharged from the ED yesterday.  Endorses taking Doxycyline.  States she has home health coming in but not sure who ordered it for her.  I have placed a referral to ensure home health does see her.  She needs a hospital bed due to immobility.  She is currently sleeping in her wheelchair because she can't get in and out of her bed.

## 2023-02-10 NOTE — Assessment & Plan Note (Signed)
Chronic. Not well controlled.  Referral placed for social work to help with grief counseling.  Continue with current medication regimen.

## 2023-02-10 NOTE — Assessment & Plan Note (Signed)
Chronic. Not well controlled.  Continues to struggle with venous ulcers.  Not able to tolerate leg pumps.  Follows up with vascular.  Endorses taking the doxycyline prescribed for recent cellulitis.

## 2023-02-10 NOTE — Assessment & Plan Note (Signed)
Chronic.  Controlled.  Continue with current medication regimen.  She has restarted Trulicity.  Continues to have financial troubles.  Referral placed for social work.  Labs ordered today.  Return to clinic in 6 months for reevaluation.  Call sooner if concerns arise.

## 2023-02-10 NOTE — Assessment & Plan Note (Signed)
Chronic. Improved.   Saw Cardiology in August.  Was placed on Doxasosin 1mg  BID.  Continue with medications- hydralazine, spironolactone, lisinopril and Lasix.  Referral placed for Advanced HTN clinic.  Labs ordered today.  Follow up in 1 months.  Continue to check blood pressures at home.

## 2023-02-10 NOTE — Assessment & Plan Note (Signed)
Recommended eating smaller high protein, low fat meals more frequently and exercising 30 mins a day 5 times a week with a goal of 10-15lb weight loss in the next 3 months.  

## 2023-02-10 NOTE — Assessment & Plan Note (Signed)
Chronic.  Controlled.  Continue with current medication regimen.  Labs ordered today.  Return to clinic in 6 months for reevaluation.  Call sooner if concerns arise.  ? ?

## 2023-02-10 NOTE — Assessment & Plan Note (Signed)
Chronic.  On 3L of O2 at home.  Not wearing oxygen in office today.  Working with Pulmonology.  Recommend getting portable oxygen to take with her when she goes places.  She is not able to take the large tank with her.

## 2023-02-11 ENCOUNTER — Ambulatory Visit: Payer: Self-pay | Admitting: *Deleted

## 2023-02-11 DIAGNOSIS — Z9981 Dependence on supplemental oxygen: Secondary | ICD-10-CM | POA: Diagnosis not present

## 2023-02-11 DIAGNOSIS — E785 Hyperlipidemia, unspecified: Secondary | ICD-10-CM | POA: Diagnosis not present

## 2023-02-11 DIAGNOSIS — E1140 Type 2 diabetes mellitus with diabetic neuropathy, unspecified: Secondary | ICD-10-CM | POA: Diagnosis not present

## 2023-02-11 DIAGNOSIS — I890 Lymphedema, not elsewhere classified: Secondary | ICD-10-CM | POA: Diagnosis not present

## 2023-02-11 DIAGNOSIS — I10 Essential (primary) hypertension: Secondary | ICD-10-CM | POA: Diagnosis not present

## 2023-02-11 DIAGNOSIS — E039 Hypothyroidism, unspecified: Secondary | ICD-10-CM | POA: Diagnosis not present

## 2023-02-11 DIAGNOSIS — F419 Anxiety disorder, unspecified: Secondary | ICD-10-CM | POA: Diagnosis not present

## 2023-02-11 DIAGNOSIS — Z6841 Body Mass Index (BMI) 40.0 and over, adult: Secondary | ICD-10-CM | POA: Diagnosis not present

## 2023-02-11 DIAGNOSIS — F32A Depression, unspecified: Secondary | ICD-10-CM | POA: Diagnosis not present

## 2023-02-11 DIAGNOSIS — J4489 Other specified chronic obstructive pulmonary disease: Secondary | ICD-10-CM | POA: Diagnosis not present

## 2023-02-11 DIAGNOSIS — J9611 Chronic respiratory failure with hypoxia: Secondary | ICD-10-CM | POA: Diagnosis not present

## 2023-02-11 DIAGNOSIS — K219 Gastro-esophageal reflux disease without esophagitis: Secondary | ICD-10-CM | POA: Diagnosis not present

## 2023-02-11 NOTE — Telephone Encounter (Signed)
Message from Ogden T sent at 02/11/2023  8:22 AM EDT  Summary: medication request   Patient called stated she has swelling and pain in her legs. She is requesting a stronger medication than gabapentin (NEURONTIN) 300 MG capsule. Please f/u with patient          Call History  Contact Date/Time Type Contact Phone/Fax User  02/11/2023 08:20 AM EDT Phone (Incoming) Delainey, Winstanley (Self) [phone] Rexene Edison) [place] Jester   Reason for Disposition  Leg pain    Requesting something stronger for pain than the gabapentin for her legs.  Answer Assessment - Initial Assessment Questions 1. ONSET: "When did the pain start?"      I'm having pain in both of my legs.   I need something stronger for pain than the gabapentin.   I have blisters on my legs.   I saw her yesterday and she is aware of my situation. No triage needed.   She forgot to ask [name] yesterday during her visit. 2. LOCATION: "Where is the pain located?"        3. PAIN: "How bad is the pain?"    (Scale 1-10; or mild, moderate, severe)   -  MILD (1-3): doesn't interfere with normal activities    -  MODERATE (4-7): interferes with normal activities (e.g., work or school) or awakens from sleep, limping    -  SEVERE (8-10): excruciating pain, unable to do any normal activities, unable to walk     4. WORK OR EXERCISE: "Has there been any recent work or exercise that involved this part of the body?"      *No Answer* 5. CAUSE: "What do you think is causing the leg pain?"     6. OTHER SYMPTOMS: "Do you have any other symptoms?" (e.g., chest pain, back pain, breathing difficulty, swelling, rash, fever, numbness, weakness)      7. PREGNANCY: "Is there any chance you are pregnant?" "When was your last menstrual period?"  Protocols used: Leg Pain-A-AH

## 2023-02-11 NOTE — Telephone Encounter (Signed)
Called and LVM asking for patient to please return my call.   OK for PEC to give patient providers message if patient calls back.

## 2023-02-11 NOTE — Telephone Encounter (Signed)
Patient will need to follow up with vascular to discuss swelling and pain.

## 2023-02-11 NOTE — Telephone Encounter (Signed)
  Chief Complaint: Requesting something stronger for pain for her legs than the gabapentin.  Symptoms: Both legs are hurting Frequency: N/A Pertinent Negatives: Patient denies N/A Disposition: [] ED /[] Urgent Care (no appt availability in office) / [] Appointment(In office/virtual)/ []  [facility]/ [] Home Care/ [] Refused Recommended Disposition /[] [place] [facility]/ [x]  Follow-up with PCP Additional Notes: Message sent to [name], NP

## 2023-02-12 ENCOUNTER — Ambulatory Visit: Payer: Self-pay | Admitting: *Deleted

## 2023-02-12 ENCOUNTER — Telehealth: Payer: Self-pay | Admitting: Nurse Practitioner

## 2023-02-12 DIAGNOSIS — J449 Chronic obstructive pulmonary disease, unspecified: Secondary | ICD-10-CM

## 2023-02-12 NOTE — Telephone Encounter (Signed)
Please let patient know that she will have to have an evaluation for an electronic wheelchair like I told her during the visit in order for it to be approved.  We discussed that she has home health PT coming and she needs to verify if they are able to do the evaluation.  If not, we will have to pursue an alternative PT for evaluation.  I have already ordered the bedside commode and hospital bed.  The orders have already been faxed to a DME company.  Please tell her to be patient and that we are working on it.

## 2023-02-12 NOTE — Telephone Encounter (Signed)
Called and spoke with patient informed her of what provider stated below patient voiced understanding

## 2023-02-12 NOTE — Telephone Encounter (Signed)
Pt returned call for [employer] message.  The hospital has set up a PT from [facility] who is coming out to see me.    [facility] has been coming here.  Cheryl Grooms, NP called someone too regarding home care.   They called me yesterday from [facility].    I told them [facility] was coming to see me.     I need a hospital bed and an electric chair.   It's going to be a long wait I was told.     They were saying Cheryl Chandler could order these for me.   Cheryl Chandler needs to call Cheryl Chandler the PT with [facility].   Cheryl Chandler is the nurse and Cheryl Chandler too that are coming out to see me.     I need an electric chair so my husband won't have to push me.     The hospital bed is needed too.     I just need to know that these companies are in my network or not.   I advised her to call her insurance co. And see what agencies are covered under her plan.   She is going to do this.  I read her the message from Cheryl Grooms, NP dated 02/11/2023 at 12:33 PM.    She already has an appt with her vascular dr. Kathrynn Chandler up.         Reason for Disposition  [1] Follow-up call to recent contact AND [2] information only call, no triage required  Answer Assessment - Initial Assessment Questions 1. REASON FOR CALL or QUESTION: "What is your reason for calling today?" or "How can I best help you?" or "What question do you have that I can help answer?"     See response to [employer] message in notes.  Protocols used: Information Only Call - No Triage-A-AH

## 2023-02-12 NOTE — Telephone Encounter (Signed)
Pt states she called her insurance company about the wheelchair and they told her the provider will need to send in an authorization for the wheelchair to be covered.

## 2023-02-12 NOTE — Telephone Encounter (Signed)
Patient requesting orders for a hospital bed and electric chair.

## 2023-02-14 NOTE — Telephone Encounter (Signed)
Yes. I placed the referral for physical therapy with the instructions she needs certification for electronic wheelchair.

## 2023-02-19 DIAGNOSIS — I890 Lymphedema, not elsewhere classified: Secondary | ICD-10-CM | POA: Diagnosis not present

## 2023-02-19 DIAGNOSIS — E1140 Type 2 diabetes mellitus with diabetic neuropathy, unspecified: Secondary | ICD-10-CM | POA: Diagnosis not present

## 2023-02-19 DIAGNOSIS — K219 Gastro-esophageal reflux disease without esophagitis: Secondary | ICD-10-CM | POA: Diagnosis not present

## 2023-02-19 DIAGNOSIS — I10 Essential (primary) hypertension: Secondary | ICD-10-CM | POA: Diagnosis not present

## 2023-02-19 DIAGNOSIS — Z9981 Dependence on supplemental oxygen: Secondary | ICD-10-CM | POA: Diagnosis not present

## 2023-02-19 DIAGNOSIS — F419 Anxiety disorder, unspecified: Secondary | ICD-10-CM | POA: Diagnosis not present

## 2023-02-19 DIAGNOSIS — E039 Hypothyroidism, unspecified: Secondary | ICD-10-CM | POA: Diagnosis not present

## 2023-02-19 DIAGNOSIS — J9611 Chronic respiratory failure with hypoxia: Secondary | ICD-10-CM | POA: Diagnosis not present

## 2023-02-19 DIAGNOSIS — E785 Hyperlipidemia, unspecified: Secondary | ICD-10-CM | POA: Diagnosis not present

## 2023-02-19 DIAGNOSIS — J4489 Other specified chronic obstructive pulmonary disease: Secondary | ICD-10-CM | POA: Diagnosis not present

## 2023-02-19 DIAGNOSIS — F32A Depression, unspecified: Secondary | ICD-10-CM | POA: Diagnosis not present

## 2023-02-19 DIAGNOSIS — Z6841 Body Mass Index (BMI) 40.0 and over, adult: Secondary | ICD-10-CM | POA: Diagnosis not present

## 2023-02-19 NOTE — Telephone Encounter (Signed)
Pt is calling to request if she can get [facility] to come back. Pt would like to continue with this service. Pt was advised by insurance to send an order. Please advise CB- [phone]  Pt had nursing, and nursing in previousy

## 2023-02-22 NOTE — Telephone Encounter (Signed)
Pt was advised of message.  Pt states that [facility] needed to sen a Prior Authorization for [facility]. Later pt states that insurance stated that they were in network.

## 2023-02-22 NOTE — Telephone Encounter (Signed)
Please call and let the patient know that she has an appointment later this week.

## 2023-02-22 NOTE — Telephone Encounter (Signed)
Please find out why [facility] stopped coming. She stated when she was in here that she already had home health coming.

## 2023-02-22 NOTE — Telephone Encounter (Signed)
Attempted to reach patient. No answer left vm for a return call   Okay for PEC to inform patient of what provider stated below.

## 2023-02-24 ENCOUNTER — Ambulatory Visit (INDEPENDENT_AMBULATORY_CARE_PROVIDER_SITE_OTHER): Payer: Medicaid Other | Admitting: Nurse Practitioner

## 2023-02-24 DIAGNOSIS — J449 Chronic obstructive pulmonary disease, unspecified: Secondary | ICD-10-CM | POA: Diagnosis not present

## 2023-02-26 DIAGNOSIS — Z419 Encounter for procedure for purposes other than remedying health state, unspecified: Secondary | ICD-10-CM | POA: Diagnosis not present

## 2023-03-02 ENCOUNTER — Other Ambulatory Visit: Payer: Self-pay | Admitting: Obstetrics and Gynecology

## 2023-03-02 NOTE — Patient Instructions (Signed)
Visit Information  Cheryl Chandler was given information about Medicaid Managed Care team care coordination services as a part of their [facility] Medicaid benefit. Cheryl Chandler verbally consented to engagement with the [facility] Managed Care team.   If you are experiencing a medical emergency, please call 911 or report to your local emergency department or urgent care.   If you have a non-emergency medical problem during routine business hours, please contact your provider's office and ask to speak with a nurse.   For questions related to your [facility] health plan, please call: [phone] or go here:[email or web address]  If you would like to schedule transportation through your [facility] plan, please call the following number at least 2 days in advance of your appointment: [phone].   You can also use the MTM portal or MTM mobile app to manage your rides. Reimbursement for transportation is available through [facility]! For the portal, please go to [email or web address]  Call the [facility] Crisis Line at [phone], at any time, 24 hours a day, 7 days a week. If you are in danger or need immediate medical attention call 911.  If you would like help to quit smoking, call 1-800-QUIT-NOW ((574)044-1383) OR Espaol: 1-855-Djelo-Ya (2-951-884-1660) o para ms informacin haga clic aqu or Text READY to 630-160 to register via text  Cheryl Chandler - following are the goals we discussed in your visit today:   Goals Addressed             This Visit's Progress    RNCM: Keep Skin Clean and Dry       Follow Up Date: 04/01/23  - clean and dry skin well - keep skin dry - use a fragrance-free lotion on skin - wear a protective pad or garment    Why is this important?   Leaking urine (pee) can cause soreness from skin rashes and redness.  This is caused by skin being exposed to urine (pee).  03/02/23:  Hospitalized d/t cellulitis/dermatitis-f/u appt  scheduled.     RNCM: Track and Manage My Symptoms-Depression       Timeframe:  Long-Range Goal Priority:  High Start Date:    12-02-2020                         Expected End Date:   ongoing                Follow Up Date: 04/01/23   - avoid negative self-talk - develop a personal safety plan - develop a plan to deal with triggers like holidays, anniversaries - exercise at least 2 to 3 times per week - have a plan for how to handle bad days - journal feelings and what helps to feel better or worse - spend time or talk with others at least 2 to 3 times per week - spend time or talk with others every day - watch for early signs of feeling worse    Why is this important?   Keeping track of your progress will help your treatment team find the right mix of medicine and therapy for you.  Write in your journal every day.  Day-to-day changes in depression symptoms are normal. It may be more helpful to check your progress at the end of each week instead of every day.   03/02/23  declines any services at this time     RNCM: Track and Manage My Triggers-COPD       Timeframe:  Long-Range Goal Priority:  Medium Start Date:     05-22-2020                        Expected End Date:   ongoing          Follow Up Date: 04/01/23   - avoid second hand smoke - eliminate smoking in my home - identify and avoid work-related triggers - identify and remove indoor air pollutants - limit outdoor activity during cold weather - listen for public air quality announcements every day    Why is this important?   Triggers are activities or things, like tobacco smoke or cold weather, that make your COPD (chronic obstructive pulmonary disease) flare-up.  Knowing these triggers helps you plan how to stay away from them.  When you cannot remove them, you can learn how to manage them.   03/02/23:  Oxygen at 3L-continuous   The patient verbalized understanding of instructions, educational materials, and care plan  provided today and DECLINED offer to receive copy of patient instructions, educational materials, and care plan.   The Managed Medicaid care management team will reach out to the patient again over the next 30 business  days.  The  Patient has been provided with contact information for the Managed Medicaid care management team and has been advised to call with any health related questions or concerns.   Kathi Der RN, BSN [place]  [facility] Care Management Coordinator - Managed Medicaid High Risk [phone]   Following is a copy of your plan of care:  Care Plan : RNCM: COPD (Adult)  Updates made by [name], RN since 03/02/2023 12:00 AM     Problem: RNCM: Psychological Adjustment to Diagnosis (COPD)   Priority: Medium  Onset Date: 05/22/2020     Long-Range Goal: RNCM: Adjustment to Disease Achieved   Start Date: 05/22/2020  Expected End Date: 06/02/2023  Recent Progress: On track  Priority: High  Note:   Current Barriers:  Knowledge deficits related to basic understanding of COPD disease process Knowledge deficits related to basic COPD self care/management Knowledge deficit related to basic understanding of how to use inhalers and how inhaled medications work Knowledge deficit related to importance of energy conservation Limited Social Support Unable to independently manage COPD Lacks social connections Does not contact provider office for questions/concerns  03/02/23:  On 3L, continuous  Case Manager Clinical Goal(s): patient will report using inhalers as prescribed including rinsing mouth after use patient will be able to verbalize understanding of COPD action plan and when to seek appropriate levels of medical care  patient will engage in lite exercise as tolerated to build/regain stamina and strength and reduce shortness of breath through activity tolerance  patient will verbalize basic understanding of COPD disease process and self care activities    Interventions:  Inter-disciplinary care team collaboration (see longitudinal plan of care) UNABLE to independently: manage COPD Provided patient with basic written and verbal COPD education on self care/management/and exacerbation prevention Provided patient with COPD action plan and reinforced importance of daily self assessment.  Provided written and verbal instructions on pursed lip breathing and utilized returned demonstration as teach back Provided instruction about proper use of medications used for management of COPD including inhalers Advised patient to self assesses COPD action plan zone and make appointment with provider if in the yellow zone for 48 hours without improvement. Provided patient with education about the role of exercise in the management of COPD Advised patient to engage  in light exercise as tolerated 3-5 days a week Provided education about and advised patient to utilize infection prevention strategies to reduce risk of respiratory infection.  Patient Goals/Self-Care Activities:  - decision-making supported - depression screen reviewed - emotional support provided - family involvement promoted - problem-solving facilitated - relaxation techniques promoted - verbalization of feelings encouraged  Follow Up Plan: Telephone follow up appointment with care management team member scheduled.   Care Plan : RNCM: Hypertension (Adult)  Updates made by [name], RN since 03/02/2023 12:00 AM     Problem: RNCM: Hypertension (Hypertension)   Priority: Medium  Onset Date: 05/22/2020     Long-Range Goal: RNCM: Hypertension Monitored   Start Date: 05/22/2020  Expected End Date: 06/02/2023  Recent Progress: On track  Priority: Medium  Note:    Current Barriers:  Knowledge Deficits related to basic understanding of hypertension pathophysiology and self care management Knowledge Deficits related to understanding of medications prescribed for management of  hypertension Limited Social Support Unable to independently manage HTN Lacks social connections Does not contact provider office for questions/concern 03/02/23:  Checks BP daily, 133/74 at PCP appt 10/15  Case Manager Clinical Goal(s):   patient will verbalize understanding of plan for hypertension management patient will demonstrate improved adherence to prescribed treatment plan for hypertension as evidenced by taking all medications as prescribed, monitoring and recording blood pressure as directed, adhering to low sodium/DASH diet patient will demonstrate improved health management independence as evidenced by checking blood pressure as directed and notifying PCP if SBP>160 or DBP > 90, taking all medications as prescribe, and adhering to a low sodium diet as discussed.  Interventions:  Inter-disciplinary care team collaboration (see longitudinal plan of care) Evaluation of current treatment plan related to hypertension self management and patient's adherence to plan as established by provider.  Provided education to patient re: stroke prevention, s/s of heart attack and stroke, DASH diet, complications of uncontrolled blood pressure Reviewed medications with patient and discussed importance of compliance.  Discussed plans with patient for ongoing care management follow up and provided patient with direct contact information for care management team Advised patient, providing education and rationale, to monitor blood pressure daily and record, calling PCP for findings outside established parameters.   Patient Goals/Self-Care Activities Over the next 120 days, patient will:  - Self administers medications as prescribed Attends all scheduled provider appointments Calls provider office for new concerns, questions, or BP outside discussed parameters Checks BP and records as discussed Follows a low sodium diet/DASH diet - blood pressure trends reviewed - depression screen reviewed - home  or ambulatory blood pressure monitoring encouraged  Follow Up Plan: Telephone follow up appointment with care management team member scheduled.     Care Plan : RNCM: Depression (Adult)  Updates made by [name], RN since 03/02/2023 12:00 AM     Problem: RNCM: Symptoms (Depression)   Priority: High  Onset Date: 12/02/2020     Long-Range Goal: RNCM: Symptoms Monitored and Managed   Start Date: 12/02/2020  Expected End Date: 05/21/2023  Recent Progress: Not on track  Priority: High  Note:   Current Barriers:  Ineffective Self Health Maintenance in a patient with Anxiety and Depression Unable to independently manage depression and anxiety Does not attend all scheduled provider appointments Lacks social connections Unable to perform IADLs independently Does not contact provider office for questions/concerns 03/02/23:  LCSW follows-declines services.  Clinical Goal(s):  Collaboration with [name], NP regarding development and update of comprehensive  plan of care as evidenced by provider attestation and co-signature Inter-disciplinary care team collaboration (see longitudinal plan of care) patient will work with care management team to address care coordination and chronic disease management needs related to Disease Management Educational Needs Care Coordination Mental Health Counseling Level of Care Concerns    Interventions:  Evaluation of current treatment plan related to Anxiety and Depression, Financial constraints related to insurance not approving care for the patient to go to wound care , Limited social support, Level of care concerns, ADL IADL limitations, Mental Health Concerns , Family and relationship dysfunction, Substance abuse issues -   , and Inability to perform IADL's independently self-management and patient's adherence to plan as established by provider. Inter-disciplinary care team collaboration (see longitudinal plan of care) Discussed plans with  patient for ongoing care management follow up and provided patient with direct contact information for care management team Evaluation of depression and anxiety.  LCSW referral for increased stress-completed Collaborated with LCSW   Self Care Activities:  Patient verbalizes understanding of plan to effectively manage depression and anxiety Self administers medications as prescribed Attends all scheduled provider appointments Calls pharmacy for medication refills Attends church or other social activities Performs ADL's independently Performs IADL's independently Calls provider office for new concerns or questions Work with the [facility] team to meet needs for continued CCM services   Patient Goals: - activity or exercise based on tolerance encouraged - depression screen reviewed - emotional support provided - healthy lifestyle promoted - medication side effects monitored and managed - pain managed - participation in mental health treatment encouraged - quality of sleep assessed - response to mental health treatment monitored - response to pharmacologic therapy monitored - sleep hygiene techniques encouraged - social activities and relationships encouraged - substance use assessed Follow Up Plan: The care management team will reach out to the patient again over the next 90 business days.

## 2023-03-02 NOTE — Patient Outreach (Signed)
Medicaid Managed Care   Nurse Care Manager Note  03/02/2023 Name:  Cheryl Chandler MRN:  [id number] DOB:  [birth year]  Cheryl Chandler is an 57 y.o. year old female who is a primary patient of Cheryl Grooms, NP.  The [facility] Managed Care Coordination team was consulted for assistance with:    Chronic healthcare management needs, OSA, GERD, HTN, DM, arthritis, HLD, asthma, COPD, anxiety/depression, lymphedema, neuropathy, obesity  Cheryl Chandler was given information about Medicaid Managed Care Coordination team services today. Cheryl Chandler Patient agreed to services and verbal consent obtained.  Engaged with patient by telephone for follow up visit in response to provider referral for case management and/or care coordination services.   Assessments/Interventions:  Review of past medical history, allergies, medications, health status, including review of consultants reports, laboratory and other test data, was performed as part of comprehensive evaluation and provision of chronic care management services.  SDOH (Social Determinants of Health) assessments and interventions performed: [name]    Flowsheet Row Patient Outreach Telephone from 03/02/2023 in [place] POPULATION HEALTH DEPARTMENT Office Visit from 02/09/2023 in [facility] Patient Outreach Telephone from 01/21/2023 in [place] POPULATION HEALTH DEPARTMENT Patient Outreach Telephone from 12/02/2022 in [place] POPULATION HEALTH DEPARTMENT Patient Outreach Telephone from 11/03/2022 in [place] POPULATION HEALTH DEPARTMENT Patient Outreach Telephone from 09/18/2022 in  POPULATION HEALTH DEPARTMENT  [name]        Food Insecurity Interventions -- -- -- -- -- Other (Comment)  [has food stamps and resources]  Housing Interventions -- -- Intervention Not Indicated -- -- --  Transportation Interventions -- -- -- Intervention Not Indicated -- --  Depression  Interventions/Treatment  -- Medication -- -- -- --  Financial Strain Interventions Other (Comment)  [patient given resources] -- -- -- -- --  Physical Activity Interventions Intervention Not Indicated, Other (Comments)  [not physically able to engae in this type of exercise] -- -- -- -- --  Stress Interventions -- -- [employer], Provide Counseling  [Patient's aunt will be taken off life support today which has triggered her grief] -- Other (Comment), Provide Counseling, Offered [employer] --  Social Connections Interventions -- -- -- -- -- Intervention Not Indicated  Health Literacy Interventions -- -- -- Intervention Not Indicated -- --     Care Plan Allergies  Allergen Reactions   Codeine Shortness Of Breath and Rash   Percocet [Oxycodone-Acetaminophen] Shortness Of Breath   Sulfa Antibiotics Shortness Of Breath and Rash   Aspirin Hives   Bee Venom     Bee stings   Darvon [Propoxyphene]     Darvocet-N 100   Latex    Oxycodone Nausea And Vomiting   Penicillins     Has patient had a PCN reaction causing immediate rash, facial/tongue/throat swelling, SOB or lightheadedness with hypotension: Unknown Has patient had a PCN reaction causing severe rash involving mucus membranes or skin necrosis: Unknown Has patient had a PCN reaction that required hospitalization: Unknown Has patient had a PCN reaction occurring within the last 10 years: Unknown If all of the above answers are "NO", then may proceed with Cephalosporin use.   Medications Reviewed Today     Reviewed by Cheryl Chandler, RN (Registered Nurse) on 03/02/23 at 1405  Med List Status: <None>   Medication Order Taking? Sig Documenting Provider Last Dose Status Informant  ACCU-CHEK GUIDE test strip [id number] No USE TO CHECK BLOOD SUGAR 3 TIMES DAILY AS DIRECTED Cheryl Grooms,  NP Taking Active Self, Other  Accu-Chek Softclix Lancets lancets [id number] No CHECK BLOOD SUGAR TWICE DAILY  Cheryl Grooms, NP Taking Active Self, Other  albuterol (PROVENTIL) (2.5 MG/3ML) 0.083% nebulizer solution [id number] No USE 1 VIAL VIA NEBULIZER EVERY 4 HOURS AS NEEDED Mecum, Erin E, PA-C Taking Active Self, Other  atorvastatin (LIPITOR) 80 MG tablet [id number] No Take 1 tablet (80 mg total) by mouth daily. Mecum, Erin E, PA-C Taking Active Self, Other  Blood Glucose Monitoring Suppl (ACCU-CHEK GUIDE) w/Device KIT [id number] No Use to check blood sugar 3 times a day. Aura Dials T, NP Taking Active Self, Other  Blood Pressure Monitoring (OMRON 3 SERIES BP MONITOR) DEVI [id number] No Use to check blood pressure as directed Cheryl Grooms, NP Taking Active Self, Other  citalopram (CELEXA) 40 MG tablet [id number] No Take 1 tablet (40 mg total) by mouth daily. Mecum, Oswaldo Conroy, PA-C Taking Active Self, Other  diphenhydrAMINE (BENADRYL) 25 mg capsule [id number] No Take 25 mg by mouth every 6 (six) hours as needed for itching. Taking once a day [provider] Taking Active Self, Other  doxycycline (VIBRA-TABS) 100 MG tablet [id number] No Take 1 tablet (100 mg total) by mouth 2 (two) times daily. Sunnie Nielsen, DO Taking Active   Dulaglutide (TRULICITY) 0.75 MG/0.5ML SOPN [id number] No 0.75 mg by ultrasound guided injection route once a week. Mecum, Oswaldo Conroy, PA-C Taking Active Self, Other  EPINEPHrine 0.3 mg/0.3 mL IJ SOAJ injection [id number] No INJECT 1 SYRINGE INTO OUTER THIGH ONCE AS NEEDED FOR SEVERE ALLERGIC REACTION. Mecum, Oswaldo Conroy, PA-C Taking Active Self, Other  Fluticasone-Umeclidin-Vilant (TRELEGY ELLIPTA) 100-62.5-25 MCG/ACT AEPB [id number] No Inhale 1 puff into the lungs daily. Salena Saner, MD Taking Active Self, Other  furosemide (LASIX) 20 MG tablet [id number] No Take 1 tablet (20 mg total) by mouth 2 (two) times daily. Antonieta Iba, MD Taking Active Self, Other  gabapentin (NEURONTIN) 300 MG capsule [id number] No TAKE 1 CAPSULE BY MOUTH 3 TIMES DAILY ASNEEDED (NEED OFFICE  VISIT FOR FURTHER REFILLS)  Patient taking differently: Take 300 mg by mouth 3 (three) times daily as needed.   Cheryl Grooms, NP Taking Active Self, Other  HYDROcodone-acetaminophen (NORCO/VICODIN) 5-325 MG tablet [id number] No Take 1-2 tablets by mouth every 6 (six) hours as needed for moderate pain or severe pain. Sunnie Nielsen, DO Taking Active   hydrOXYzine (VISTARIL) 25 MG capsule [id number] No Take 1 capsule (25 mg total) by mouth every 8 (eight) hours as needed. Mecum, Erin E, PA-C Taking Active Self, Other  levothyroxine (SYNTHROID) 50 MCG tablet [id number] No TAKE 1 TABLET BY MOUTH ONCE DAILY ON AN EMPTY STOMACH. WAIT 30 MINUTES BEFORE TAKING OTHER MEDS. Mecum, Erin E, PA-C Taking Active Self, Other  lisinopril (ZESTRIL) 40 MG tablet [id number] No TAKE 1 TABLET BY MOUTH ONCE DAILY Cheryl Grooms, NP Taking Active Self, Other  meloxicam (MOBIC) 15 MG tablet [id number] No TAKE 1 TABLET BY MOUTH ONCE DAILY AS NEEDED WITH FOOD OR MILK Cheryl Grooms, NP Taking Active Self, Other           Med Note Littie Deeds, CHERYL A   Fri Aug 07, 2022  9:13 AM) Taking daily  montelukast (SINGULAIR) 10 MG tablet [id number] No TAKE 1 TABLET BY MOUTH AT BEDTIME Cheryl Grooms, NP Taking Active Self, Other  nitroGLYCERIN (NITROSTAT) 0.4 MG SL tablet [id number] No DISSOLVE 1 TABLET UNDER TONGUE AT ONSET OF CHEST PAIN. REPEAT IN 5 MIN IF NOT RESOLVED, MAX 3 DOSES. 911 IF NEEDED. Cheryl Grooms, NP  Taking Active Self, Other  omeprazole (PRILOSEC) 20 MG capsule [id number] No TAKE 1 CAPSULE BY MOUTH ONCE DAILY Cheryl Grooms, NP Taking Active Self, Other  OXYGEN [id number] No Inhale 3 L into the lungs daily. [provider] Taking Active Self, Other  Silver (AQUACEL AG FOAM) 8"X8" PADS [id number] No Apply to areas of ulceration on legs as directed Sunnie Nielsen, DO Taking Active   spironolactone (ALDACTONE) 25 MG tablet [id number] No Take 0.5 tablets (12.5 mg total) by mouth daily. Cheryl Grooms, NP Taking Active Self, Other  Vitamin D, Ergocalciferol, (DRISDOL) 1.25 MG (50000 UNIT) CAPS capsule [id number] No TAKE 1 CAPSULE BY MOUTH EVERY 7 DAYS Cheryl Grooms, NP Taking Active Self, Other  Wound Cleansers (VASHE WOUND) 0.033 % SOLN [id number] No Apply 1 Application topically daily. Use as directed to cleanse lower legs skin Sunnie Nielsen, DO Taking Active             Patient Active Problem List   Diagnosis Date Noted   Dermatitis 02/02/2023   Cellulitis 02/01/2023   Venous ulcer of ankle, left (HCC) 11/16/2022   Moderate episode of recurrent major depressive disorder (HCC) 08/03/2022   Controlled type 2 diabetes mellitus with diabetic polyneuropathy, without long-term current use of insulin (HCC) 08/03/2022   Venous ulcer (HCC) 05/03/2022   Foot pain, right 09/01/2021   Fibrocystic disease of both breasts 05/07/2019   Chronic venous insufficiency 11/09/2018   Overactive bladder 09/30/2018   Peripheral neuropathy 09/30/2018   Apnea 01/02/2016   Lymphedema 11/30/2014   Dyspnea on exertion 11/30/2014   Anxiety 11/29/2014   Arthritis 11/29/2014   COPD (chronic obstructive pulmonary disease) (HCC) 11/29/2014   Clinical depression 11/29/2014   H/O eating disorder 11/29/2014   Esophageal reflux 11/29/2014   Acne inversa 11/29/2014   Essential hypertension 11/29/2014   HLD (hyperlipidemia) 11/29/2014   Adult hypothyroidism 11/29/2014   Insomnia 11/29/2014   Low back pain 11/29/2014   Morbid obesity (HCC) 11/29/2014   Posttraumatic stress disorder 11/29/2014   Vitamin D deficiency 11/29/2014   Nicotine dependence, cigarettes, uncomplicated 11/29/2014   Conditions to be addressed/monitored per PCP order:  Chronic healthcare management needs, OSA, GERD, HTN, DM, arthritis, HLD, asthma, COPD, anxiety/depression, lymphedema, neuropathy, obesity  Care Plan : RNCM: COPD (Adult)  Updates made by Cheryl Chandler, RN since 03/02/2023 12:00 AM     Problem: RNCM:  Psychological Adjustment to Diagnosis (COPD)   Priority: Medium  Onset Date: 05/22/2020     Long-Range Goal: RNCM: Adjustment to Disease Achieved   Start Date: 05/22/2020  Expected End Date: 06/02/2023  Recent Progress: On track  Priority: High  Note:   Current Barriers:  Knowledge deficits related to basic understanding of COPD disease process Knowledge deficits related to basic COPD self care/management Knowledge deficit related to basic understanding of how to use inhalers and how inhaled medications work Knowledge deficit related to importance of energy conservation Limited Social Support Unable to independently manage COPD Lacks social connections Does not contact provider office for questions/concerns  03/02/23:  On 3L, continuous  Case Manager Clinical Goal(s): patient will report using inhalers as prescribed including rinsing mouth after use patient will be able to verbalize understanding of COPD action plan and when to seek appropriate levels of medical care  patient will engage in lite exercise as tolerated to build/regain stamina and strength and reduce shortness of breath through activity tolerance  patient will verbalize basic understanding of COPD disease process and self care activities   Interventions:  Inter-disciplinary care  team collaboration (see longitudinal plan of care) UNABLE to independently: manage COPD Provided patient with basic written and verbal COPD education on self care/management/and exacerbation prevention Provided patient with COPD action plan and reinforced importance of daily self assessment.  Provided written and verbal instructions on pursed lip breathing and utilized returned demonstration as teach back Provided instruction about proper use of medications used for management of COPD including inhalers Advised patient to self assesses COPD action plan zone and make appointment with provider if in the yellow zone for 48 hours without  improvement. Provided patient with education about the role of exercise in the management of COPD Advised patient to engage in light exercise as tolerated 3-5 days a week Provided education about and advised patient to utilize infection prevention strategies to reduce risk of respiratory infection.  Patient Goals/Self-Care Activities:  - decision-making supported - depression screen reviewed - emotional support provided - family involvement promoted - problem-solving facilitated - relaxation techniques promoted - verbalization of feelings encouraged  Follow Up Plan: Telephone follow up appointment with care management team member scheduled.   Care Plan : RNCM: Hypertension (Adult)  Updates made by Cheryl Chandler, RN since 03/02/2023 12:00 AM     Problem: RNCM: Hypertension (Hypertension)   Priority: Medium  Onset Date: 05/22/2020     Long-Range Goal: RNCM: Hypertension Monitored   Start Date: 05/22/2020  Expected End Date: 06/02/2023  Recent Progress: On track  Priority: Medium  Note:    Current Barriers:  Knowledge Deficits related to basic understanding of hypertension pathophysiology and self care management Knowledge Deficits related to understanding of medications prescribed for management of hypertension Limited Social Support Unable to independently manage HTN Lacks social connections Does not contact provider office for questions/concern 03/02/23:  Checks BP daily, 133/74 at PCP appt 10/15  Case Manager Clinical Goal(s):   patient will verbalize understanding of plan for hypertension management patient will demonstrate improved adherence to prescribed treatment plan for hypertension as evidenced by taking all medications as prescribed, monitoring and recording blood pressure as directed, adhering to low sodium/DASH diet patient will demonstrate improved health management independence as evidenced by checking blood pressure as directed and notifying PCP if SBP>160 or DBP  > 90, taking all medications as prescribe, and adhering to a low sodium diet as discussed.  Interventions:  Inter-disciplinary care team collaboration (see longitudinal plan of care) Evaluation of current treatment plan related to hypertension self management and patient's adherence to plan as established by provider.  Provided education to patient re: stroke prevention, s/s of heart attack and stroke, DASH diet, complications of uncontrolled blood pressure Reviewed medications with patient and discussed importance of compliance.  Discussed plans with patient for ongoing care management follow up and provided patient with direct contact information for care management team Advised patient, providing education and rationale, to monitor blood pressure daily and record, calling PCP for findings outside established parameters.   Patient Goals/Self-Care Activities Over the next 120 days, patient will:  - Self administers medications as prescribed Attends all scheduled provider appointments Calls provider office for new concerns, questions, or BP outside discussed parameters Checks BP and records as discussed Follows a low sodium diet/DASH diet - blood pressure trends reviewed - depression screen reviewed - home or ambulatory blood pressure monitoring encouraged  Follow Up Plan: Telephone follow up appointment with care management team member scheduled.     Care Plan : RNCM: Depression (Adult)  Updates made by Cheryl Chandler, RN since 03/02/2023 12:00 AM  Problem: RNCM: Symptoms (Depression)   Priority: High  Onset Date: 12/02/2020     Long-Range Goal: RNCM: Symptoms Monitored and Managed   Start Date: 12/02/2020  Expected End Date: 05/21/2023  Recent Progress: Not on track  Priority: High  Note:   Current Barriers:  Ineffective Self Health Maintenance in a patient with Anxiety and Depression Unable to independently manage depression and anxiety Does not attend all scheduled provider  appointments Lacks social connections Unable to perform IADLs independently Does not contact provider office for questions/concerns 03/02/23:  LCSW follows-declines services.  Clinical Goal(s):  Collaboration with Cheryl Grooms, NP regarding development and update of comprehensive plan of care as evidenced by provider attestation and co-signature Inter-disciplinary care team collaboration (see longitudinal plan of care) patient will work with care management team to address care coordination and chronic disease management needs related to Disease Management Educational Needs Care Coordination Mental Health Counseling Level of Care Concerns    Interventions:  Evaluation of current treatment plan related to Anxiety and Depression, Financial constraints related to insurance not approving care for the patient to go to wound care , Limited social support, Level of care concerns, ADL IADL limitations, Mental Health Concerns , Family and relationship dysfunction, Substance abuse issues -   , and Inability to perform IADL's independently self-management and patient's adherence to plan as established by provider. Inter-disciplinary care team collaboration (see longitudinal plan of care) Discussed plans with patient for ongoing care management follow up and provided patient with direct contact information for care management team Evaluation of depression and anxiety.  LCSW referral for increased stress-completed Collaborated with LCSW   Self Care Activities:  Patient verbalizes understanding of plan to effectively manage depression and anxiety Self administers medications as prescribed Attends all scheduled provider appointments Calls pharmacy for medication refills Attends church or other social activities Performs ADL's independently Performs IADL's independently Calls provider office for new concerns or questions Work with the [facility] team to meet needs for continued CCM services   Patient  Goals: - activity or exercise based on tolerance encouraged - depression screen reviewed - emotional support provided - healthy lifestyle promoted - medication side effects monitored and managed - pain managed - participation in mental health treatment encouraged - quality of sleep assessed - response to mental health treatment monitored - response to pharmacologic therapy monitored - sleep hygiene techniques encouraged - social activities and relationships encouraged - substance use assessed Follow Up Plan: The care management team will reach out to the patient again over the next 90 business days.   Follow Up:  Patient agrees to Care Plan and Follow-up.  Plan: The Managed Medicaid care management team will reach out to the patient again over the next 30 business  days. and The  Patient has been provided with contact information for the Managed Medicaid care management team and has been advised to call with any health related questions or concerns.  Date/time of next scheduled RN care management/care coordination outreach:  04/01/23 at 1230.

## 2023-03-04 ENCOUNTER — Telehealth: Payer: Self-pay

## 2023-03-04 DIAGNOSIS — I10 Essential (primary) hypertension: Secondary | ICD-10-CM | POA: Diagnosis not present

## 2023-03-04 DIAGNOSIS — E785 Hyperlipidemia, unspecified: Secondary | ICD-10-CM | POA: Diagnosis not present

## 2023-03-04 DIAGNOSIS — J9611 Chronic respiratory failure with hypoxia: Secondary | ICD-10-CM | POA: Diagnosis not present

## 2023-03-04 DIAGNOSIS — E039 Hypothyroidism, unspecified: Secondary | ICD-10-CM | POA: Diagnosis not present

## 2023-03-04 DIAGNOSIS — J4489 Other specified chronic obstructive pulmonary disease: Secondary | ICD-10-CM | POA: Diagnosis not present

## 2023-03-04 DIAGNOSIS — E1140 Type 2 diabetes mellitus with diabetic neuropathy, unspecified: Secondary | ICD-10-CM | POA: Diagnosis not present

## 2023-03-04 DIAGNOSIS — F419 Anxiety disorder, unspecified: Secondary | ICD-10-CM | POA: Diagnosis not present

## 2023-03-04 DIAGNOSIS — I890 Lymphedema, not elsewhere classified: Secondary | ICD-10-CM | POA: Diagnosis not present

## 2023-03-04 DIAGNOSIS — Z9981 Dependence on supplemental oxygen: Secondary | ICD-10-CM | POA: Diagnosis not present

## 2023-03-04 DIAGNOSIS — Z6841 Body Mass Index (BMI) 40.0 and over, adult: Secondary | ICD-10-CM | POA: Diagnosis not present

## 2023-03-04 DIAGNOSIS — K219 Gastro-esophageal reflux disease without esophagitis: Secondary | ICD-10-CM | POA: Diagnosis not present

## 2023-03-04 DIAGNOSIS — F32A Depression, unspecified: Secondary | ICD-10-CM | POA: Diagnosis not present

## 2023-03-04 NOTE — Telephone Encounter (Signed)
Called patient to schedule and appt  left detailed message and put in CRM foir  PEC

## 2023-03-04 NOTE — Telephone Encounter (Signed)
Received fax from [employer] regarding paperwork for a power wheelchair. Paperwork cannot be filled out until patient has an in person visit with provider to discuss and be documented in an office visit note. Please call patient and schedule an office visit.

## 2023-03-05 DIAGNOSIS — Z993 Dependence on wheelchair: Secondary | ICD-10-CM | POA: Diagnosis not present

## 2023-03-05 DIAGNOSIS — N3281 Overactive bladder: Secondary | ICD-10-CM | POA: Diagnosis not present

## 2023-03-05 DIAGNOSIS — R3981 Functional urinary incontinence: Secondary | ICD-10-CM | POA: Diagnosis not present

## 2023-03-08 ENCOUNTER — Ambulatory Visit (INDEPENDENT_AMBULATORY_CARE_PROVIDER_SITE_OTHER): Payer: Medicaid Other | Admitting: Nurse Practitioner

## 2023-03-08 ENCOUNTER — Other Ambulatory Visit: Payer: Self-pay | Admitting: Obstetrics and Gynecology

## 2023-03-08 ENCOUNTER — Telehealth: Payer: Self-pay | Admitting: Nurse Practitioner

## 2023-03-08 ENCOUNTER — Encounter: Payer: Self-pay | Admitting: Nurse Practitioner

## 2023-03-08 VITALS — BP 122/61 | HR 74 | Temp 98.5°F | Ht 62.0 in

## 2023-03-08 DIAGNOSIS — Z9181 History of falling: Secondary | ICD-10-CM | POA: Diagnosis not present

## 2023-03-08 DIAGNOSIS — R0609 Other forms of dyspnea: Secondary | ICD-10-CM | POA: Diagnosis not present

## 2023-03-08 DIAGNOSIS — R29898 Other symptoms and signs involving the musculoskeletal system: Secondary | ICD-10-CM | POA: Diagnosis not present

## 2023-03-08 DIAGNOSIS — I890 Lymphedema, not elsewhere classified: Secondary | ICD-10-CM

## 2023-03-08 DIAGNOSIS — J449 Chronic obstructive pulmonary disease, unspecified: Secondary | ICD-10-CM

## 2023-03-08 MED ORDER — DOCUSATE SODIUM 50 MG PO CAPS
50.0000 mg | ORAL_CAPSULE | Freq: Two times a day (BID) | ORAL | 0 refills | Status: DC
Start: 2023-03-08 — End: 2023-04-02

## 2023-03-08 NOTE — Telephone Encounter (Signed)
Called and LVM notifying patient that medication has been sent in for her.

## 2023-03-08 NOTE — Telephone Encounter (Signed)
Copied from CRM [id number]. Topic: Appointment Scheduling - Scheduling Inquiry for Clinic >> Mar 08, 2023 12:28 PM Marlow Baars wrote: Reason for CRM: The patient called in stating she mentioned to her provider this morning about having issues with her bowels. She says her bowels are just too hard. She was hoping that meant her provider was going to call in some type of stool softener to her pharmacy. She uses [facility].

## 2023-03-08 NOTE — Patient Outreach (Signed)
Care Coordination  03/08/2023  Cheryl Chandler Sep 16, 1965 [id number]  RNCM called patient to let her know DME ordered through [facility].  Patient states she saw PCP and is aware it is being ordered.  Patient also stated she is constipated-discussed stool softeners, increasing fluid and fiber intake.  Patient states she will call her PCP to follow up.  All questions answered at this time.  Kathi Der RN, BSN [place]  [employer] - Managed Medicaid High Risk [phone]

## 2023-03-08 NOTE — Telephone Encounter (Signed)
Routing to provider to advise.  

## 2023-03-08 NOTE — Assessment & Plan Note (Signed)
Patient is on Oxygen at home.  Has trouble with ADLs due to SOB and decreased strength.  Patient would benefit from a hospital bed to prevent pressure ulcers.  Patient also would benefit from [facility] to help her remain independent of ADLs.

## 2023-03-08 NOTE — Telephone Encounter (Signed)
Colace sent to the pharmacy.

## 2023-03-08 NOTE — Progress Notes (Signed)
BP 122/61   Pulse 74   Temp 98.5 F (36.9 C) (Oral)   Ht 5\' 2"  (1.575 m)   LMP 06/09/2017 (Approximate)   SpO2 96%   BMI 57.61 kg/m    Subjective:    Patient ID: Cheryl Chandler, female    DOB: Nov 23, 1965, 57 y.o.   MRN: [id number]  HPI: Cheryl Chandler is a 57 y.o. female  Chief Complaint  Patient presents with  . Hypertension  . Power Wheel Chair   Patient is being seen today for a face to face mobility assessment.   Patient needs a PWC to improve mobility and to complete regular ADLs including eating, bathing, toileting and to remain independent within her home.  She is physically and Mentally capable of operating all controls on the PWC.   She is not able to stand on her legs and walk which is why she is not able to use a cane, walker, POV.  Her upper body strength is limited which is why she has trouble using a manual wheelchair.  Her husband has to wheel her around in her manual chair.   ROM: upper body strength 2/5 Bilaterally  Lower body strength 1/5 Bilaterally  She needs a hospital bed due to not being able to get into the bed and out of it with her very limited upper body and lower body strength.  Patient is unable to help herself get into a bed our out of a bed.  She is capable of operating all controls on the bed.   She is able to bath herself but she is not able to transfer herself from the chair to bathroom.  Her husband has to help her.  Having a power wheelchair will help her stay independent of her ADLs without the help of her husband.      Relevant past medical, surgical, family and social history reviewed and updated as indicated. Interim medical history since our last visit reviewed. Allergies and medications reviewed and updated.  Review of Systems  Respiratory:  Positive for shortness of breath.   Musculoskeletal:        Decreased strength in upper and lower extremities    Per HPI unless specifically indicated above     Objective:     BP 122/61   Pulse 74   Temp 98.5 F (36.9 C) (Oral)   Ht 5\' 2"  (1.575 m)   LMP 06/09/2017 (Approximate)   SpO2 96%   BMI 57.61 kg/m   Wt Readings from Last 3 Encounters:  02/01/23 (!) 315 lb (142.9 kg)  12/18/22 (!) 326 lb (147.9 kg)  05/12/22 280 lb (127 kg)    Physical Exam Vitals and nursing note reviewed.  Constitutional:      General: She is not in acute distress.    Appearance: Normal appearance. She is normal weight. She is not ill-appearing, toxic-appearing or diaphoretic.  HENT:     Head: Normocephalic.     Right Ear: External ear normal.     Left Ear: External ear normal.     Nose: Nose normal.     Mouth/Throat:     Mouth: Mucous membranes are moist.     Pharynx: Oropharynx is clear.  Eyes:     General:        Right eye: No discharge.        Left eye: No discharge.     Extraocular Movements: Extraocular movements intact.     Conjunctiva/sclera: Conjunctivae normal.     Pupils: Pupils  are equal, round, and reactive to light.  Cardiovascular:     Rate and Rhythm: Normal rate and regular rhythm.     Heart sounds: No murmur heard. Pulmonary:     Effort: Pulmonary effort is normal. No respiratory distress.     Breath sounds: Normal breath sounds. No wheezing or rales.  Musculoskeletal:     Cervical back: Normal range of motion and neck supple.     Comments: In a manual wheelchair.  Not able to stand on her own.  Skin:    General: Skin is warm and dry.     Capillary Refill: Capillary refill takes less than 2 seconds.  Neurological:     General: No focal deficit present.     Mental Status: She is alert and oriented to person, place, and time. Mental status is at baseline.  Psychiatric:        Mood and Affect: Mood normal.        Behavior: Behavior normal.        Thought Content: Thought content normal.        Judgment: Judgment normal.    Results for orders placed or performed during the hospital encounter of 02/08/23  CBC  Result Value Ref Range    WBC 7.1 4.0 - 10.5 K/uL   RBC 4.47 3.87 - 5.11 MIL/uL   Hemoglobin 12.4 12.0 - 15.0 g/dL   HCT 82.9 56.2 - 13.0 %   MCV 86.6 80.0 - 100.0 fL   MCH 27.7 26.0 - 34.0 pg   MCHC 32.0 30.0 - 36.0 g/dL   RDW 86.5 (H) 78.4 - 69.6 %   Platelets 365 150 - 400 K/uL   nRBC 0.0 0.0 - 0.2 %  Basic metabolic panel  Result Value Ref Range   Sodium 138 135 - 145 mmol/L   Potassium 4.1 3.5 - 5.1 mmol/L   Chloride 101 98 - 111 mmol/L   CO2 27 22 - 32 mmol/L   Glucose, Bld 109 (H) 70 - 99 mg/dL   BUN 26 (H) 6 - 20 mg/dL   Creatinine, Ser 2.95 0.44 - 1.00 mg/dL   Calcium 9.1 8.9 - 28.4 mg/dL   GFR, Estimated >13 >24 mL/min   Anion gap 10 5 - 15  Lactic acid, plasma  Result Value Ref Range   Lactic Acid, Venous 1.2 0.5 - 1.9 mmol/L      Assessment & Plan:   Problem List Items Addressed This Visit       Respiratory   COPD (chronic obstructive pulmonary disease) (HCC) - Primary    Patient is on Oxygen at home.  Has trouble with ADLs due to SOB and decreased strength.  Patient would benefit from a hospital bed to prevent pressure ulcers.  Patient also would benefit from [facility] to help her remain independent of ADLs.        Other   Morbid obesity (HCC)   Lymphedema   Dyspnea on exertion   Other Visit Diagnoses     Risk for falls       Decreased strength of upper extremity       Decreased strength of lower extremity            Follow up plan: Return if symptoms worsen or fail to improve.  A total of 30 minutes were spent on this encounter today.  When total time is documented, this includes both the face-to-face and non-face-to-face time personally spent before, during and after the visit on the date of the  encounter assessing patient, reviewing documentation and completing forms for patient.

## 2023-03-11 ENCOUNTER — Telehealth: Payer: Self-pay | Admitting: Nurse Practitioner

## 2023-03-11 NOTE — Telephone Encounter (Signed)
Shireen Miler from [facility] called stated patients eval was moved to next week due to death in the patients family

## 2023-03-16 ENCOUNTER — Ambulatory Visit: Payer: Medicaid Other | Admitting: Dermatology

## 2023-03-18 DIAGNOSIS — J4489 Other specified chronic obstructive pulmonary disease: Secondary | ICD-10-CM | POA: Diagnosis not present

## 2023-03-18 DIAGNOSIS — F419 Anxiety disorder, unspecified: Secondary | ICD-10-CM | POA: Diagnosis not present

## 2023-03-18 DIAGNOSIS — Z6841 Body Mass Index (BMI) 40.0 and over, adult: Secondary | ICD-10-CM | POA: Diagnosis not present

## 2023-03-18 DIAGNOSIS — Z9981 Dependence on supplemental oxygen: Secondary | ICD-10-CM | POA: Diagnosis not present

## 2023-03-18 DIAGNOSIS — F32A Depression, unspecified: Secondary | ICD-10-CM | POA: Diagnosis not present

## 2023-03-18 DIAGNOSIS — E1140 Type 2 diabetes mellitus with diabetic neuropathy, unspecified: Secondary | ICD-10-CM | POA: Diagnosis not present

## 2023-03-18 DIAGNOSIS — K219 Gastro-esophageal reflux disease without esophagitis: Secondary | ICD-10-CM | POA: Diagnosis not present

## 2023-03-18 DIAGNOSIS — J9611 Chronic respiratory failure with hypoxia: Secondary | ICD-10-CM | POA: Diagnosis not present

## 2023-03-18 DIAGNOSIS — E785 Hyperlipidemia, unspecified: Secondary | ICD-10-CM | POA: Diagnosis not present

## 2023-03-18 DIAGNOSIS — I10 Essential (primary) hypertension: Secondary | ICD-10-CM | POA: Diagnosis not present

## 2023-03-18 DIAGNOSIS — E039 Hypothyroidism, unspecified: Secondary | ICD-10-CM | POA: Diagnosis not present

## 2023-03-18 DIAGNOSIS — I890 Lymphedema, not elsewhere classified: Secondary | ICD-10-CM | POA: Diagnosis not present

## 2023-03-19 ENCOUNTER — Telehealth: Payer: Self-pay | Admitting: Nurse Practitioner

## 2023-03-19 NOTE — Telephone Encounter (Signed)
Called and LVM giving verbal orders per [name].

## 2023-03-19 NOTE — Telephone Encounter (Signed)
Copied from CRM [id number]. Topic: General - Other >> Mar 18, 2023  3:41 PM Phill Myron wrote: Home Health Verbal Orders - Caller/Agency: Shireen with [facility]  Callback Number: [phone] Service Requested: Occupational Therapy Frequency: 1w3 with a re certification  Any new concerns about the patient? Yes   Re certification

## 2023-03-19 NOTE — Telephone Encounter (Signed)
Okay for verbal order

## 2023-03-23 ENCOUNTER — Other Ambulatory Visit: Payer: Self-pay | Admitting: Licensed Clinical Social Worker

## 2023-03-23 ENCOUNTER — Telehealth: Payer: Self-pay | Admitting: Nurse Practitioner

## 2023-03-23 NOTE — Telephone Encounter (Signed)
Copied from CRM [id number]. Topic: General - Other >> Mar 23, 2023 11:45 AM Epimenio Foot F wrote: Reason for CRM: Pt is calling in because she wants to know the status of her power chair and bed and bedside toilet. Pt wants to know if there is anything else she needs to do to get these items because she is unfamiliar with the process. Please follow up with pt.

## 2023-03-23 NOTE — Patient Outreach (Addendum)
Medicaid Managed Care Social Work Note  03/23/2023 Name:  Cheryl Chandler MRN:  [id number] DOB:  [birth year]  Cheryl Chandler is an 57 y.o. year old female who is a primary patient of Cheryl Grooms, NP.  The [facility] Managed Care Coordination team was consulted for assistance with:  Grief Counseling  Cheryl Chandler was given information about Medicaid Managed Care Coordination team services today. Cheryl Chandler Patient agreed to services and verbal consent obtained.  Engaged with patient  for by telephone forfollow up visit in response to referral for case management and/or care coordination services.   Assessments/Interventions:  Review of past medical history, allergies, medications, health status, including review of consultants reports, laboratory and other test data, was performed as part of comprehensive evaluation and provision of chronic care management services.  SDOH: (Social Determinant of Health) assessments and interventions performed: [name]    Flowsheet Row Patient Outreach Telephone from 03/23/2023 in [place] [facility] DEPARTMENT Most recent reading at 03/23/2023 11:23 AM Patient Outreach Telephone from 03/08/2023 in [place] POPULATION HEALTH DEPARTMENT Most recent reading at 03/08/2023 12:23 PM Office Visit from 03/08/2023 in [facility] Most recent reading at 03/08/2023  8:20 AM Patient Outreach Telephone from 03/02/2023 in [place] POPULATION HEALTH DEPARTMENT Most recent reading at 03/02/2023  2:04 PM Office Visit from 02/09/2023 in [facility] Most recent reading at 02/09/2023  3:11 PM Patient Outreach Telephone from 01/21/2023 in [place] POPULATION HEALTH DEPARTMENT Most recent reading at 01/21/2023 11:25 AM  [name]        Housing Interventions -- -- -- -- -- Intervention Not Indicated  Alcohol Usage Interventions -- Intervention Not Indicated (Score <7) --  -- -- --  Depression Interventions/Treatment  -- -- Medication, Currently on Treatment -- Medication --  Financial Strain Interventions -- -- -- Other (Comment)  [patient given resources] -- --  Physical Activity Interventions -- -- -- Intervention Not Indicated, Other (Comments)  [not physically able to engae in this type of exercise] -- --  Stress Interventions Offered [employer], Provide Counseling  [Aunt recently passed] -- -- -- -- [employer], Provide Counseling  [Patient's aunt will be taken off life support today which has triggered her grief]  Social Connections Interventions -- Intervention Not Indicated -- -- -- --       Advanced Directives Status:  See Care Plan for related entries.  Care Plan                 Allergies  Allergen Reactions   Codeine Shortness Of Breath and Rash   Percocet [Oxycodone-Acetaminophen] Shortness Of Breath   Sulfa Antibiotics Shortness Of Breath and Rash   Aspirin Hives   Bee Venom     Bee stings   Darvon [Propoxyphene]     Darvocet-N 100   Latex    Oxycodone Nausea And Vomiting   Penicillins     Has patient had a PCN reaction causing immediate rash, facial/tongue/throat swelling, SOB or lightheadedness with hypotension: Unknown Has patient had a PCN reaction causing severe rash involving mucus membranes or skin necrosis: Unknown Has patient had a PCN reaction that required hospitalization: Unknown Has patient had a PCN reaction occurring within the last 10 years: Unknown If all of the above answers are "NO", then may proceed with Cephalosporin use.    Medications Reviewed Today   Medications were not reviewed in this encounter     Patient Active Problem List  Diagnosis Date Noted   Dermatitis 02/02/2023   Cellulitis 02/01/2023   Venous ulcer of ankle, left (HCC) 11/16/2022   Moderate episode of recurrent major depressive disorder (HCC) 08/03/2022   Controlled type 2 diabetes mellitus with  diabetic polyneuropathy, without long-term current use of insulin (HCC) 08/03/2022   Venous ulcer (HCC) 05/03/2022   Foot pain, right 09/01/2021   Fibrocystic disease of both breasts 05/07/2019   Chronic venous insufficiency 11/09/2018   Overactive bladder 09/30/2018   Peripheral neuropathy 09/30/2018   Apnea 01/02/2016   Lymphedema 11/30/2014   Dyspnea on exertion 11/30/2014   Anxiety 11/29/2014   Arthritis 11/29/2014   COPD (chronic obstructive pulmonary disease) (HCC) 11/29/2014   Clinical depression 11/29/2014   H/O eating disorder 11/29/2014   Esophageal reflux 11/29/2014   Acne inversa 11/29/2014   Essential hypertension 11/29/2014   HLD (hyperlipidemia) 11/29/2014   Adult hypothyroidism 11/29/2014   Insomnia 11/29/2014   Low back pain 11/29/2014   Morbid obesity (HCC) 11/29/2014   Posttraumatic stress disorder 11/29/2014   Vitamin D deficiency 11/29/2014   Nicotine dependence, cigarettes, uncomplicated 11/29/2014    Conditions to be addressed/monitored per PCP order:   Grief  Care Plan : General Social Work (Adult)  Updates made by Cheryl Bryant, LCSW since 03/23/2023 12:00 AM     Problem: Coping Skills (General Plan of Care)      Long-Range Goal: Coping Skills Enhanced   Start Date: 11/07/2020  Expected End Date: 03/07/2022  Recent Progress: On track  Priority: High  Note:   Timeframe:  Long-Range Goal Priority:  High Start Date:       08/26/21                 Expected End Date:  ongoing  Follow up date- 05/04/23 at 11 am   Current Barriers:  Financial constraints Mental Health Concerns  Social Isolation Limited education about mental health support resources that are available to her within the local area* Cognitive Deficits Lacks knowledge of community resource   Clinical Social Work Clinical Goal(s):  Over the next 90 days, client will work with SW to address concerns related to experiencing ongoing symptoms of depression, sadness and grief since the  passing of her mother Over the next 90 days, patient will work with LCSW to address needs related to implementing appropriate self-care and depression management.    Interventions: Patient interviewed and appropriate assessments performed Provided mental health counseling with regards to patient's loss and ongoing stressors. Patient was very close with her mother who passed away a few years ago and she continues to experience symptoms of grief from this loss. Patient denies wanting LCSW to make a referral for grief counseling through [facility] again but was receptive to coping skill and resource education/information that was provided during session today. Patient reports decorating for the holidays helps her to connect with her mother as they did this together when she was alive.  Provided patient with information about managing depression within her daily life. Patient has ongoing grief symptoms as well. Patient states "I feel worn down." Patient reports that she talks out loud to her mother's picture and this is very helpful to her. Grief support resource education provided again during session today.  Provided reflective listening and implemented appropriate interventions to help support patient and her emotional needs  Discussed plans with patient for ongoing care management follow up and provided patient with direct contact information for [facility] team Advised patient to contact [facility] team with any urgent  case management needs. Assisted patient/caregiver with obtaining information about health plan benefits LCSW completed past referral for family to have a wheelchair ramp built at their residence due to ongoing mobility concerns. Ramp has been successfully installed.  Positive reinforcement provided to patient for quitting smoking cigarettes. Coping skill review education provided for future triggers.  Brief self-care education provided to both patient and spouse. Patient confirms that she continues  to go to food pantry for food insecurity. Resource information has been provided.  Patient reports ongoing pain and issues with mobility. Patient shares that her pain resides in her pains and legs. Emotional support and coping skill education provided. Patient confirms stable transportation arrangements for upcoming appointments next week.  Patient reports that she does not have an aide at this time. Patient reports that her continues to be a positive support for her and someone that she can rely on in the case of emergencies.  Patient was educated on healthy self-care skills and advised her to use these into her daily routine.  Patient reports that she has been elevating her legs daily to elevate pain and swelling. [facility] LCSW 06/25/22 update- Patient reports that she is stable and her emotions/feelings have been more manageable lately but she and her spouse are still struggling financially. Patient still declines wanting a referral for a long term therapist so that she can help manage these financial stressors that trigger her stress. Patient is aware of [facility] resource that she was very close to starting therapy with in 2023. Stress management education provided. Patient is agreeable to a 60 day follow up call. 07/28/22- Patient shares that her depression has lifted recently but she is worried as the anniversary of her moms' passing is coming soon. [facility] LCSW provided reeducation on her available resources. She was educated on healthy self-care and reports that she is able to get outside today to get some Vitamin D and spend time with her nieces, nephews and sister. Positive reinforcement provided. 08/24/22- Patient has been experiencing recent grief stressors and was tearful during social work session. Patient was strongly encouraged to consider grief therapy as her uncle buck and aunt sissy are both in critical care at the hospital. [facility] LCSW provided extensive emotional support and will do a 30 day  follow up call due to recent additional stressors. Update- Patient's uncle passed and she is experiencing some grief related to this loss. Emotional support provided. [facility] LCSW encouraged patient to consider reaching out to [facility] to gain therapy but she reports not needing this support right now. 11/03/22- Patient reports that she has been making a daily effort to eat healthy and attempt to be a little more active. Patient reports that she has lost over 100 lbs. [facility] LCSW provided extensive positive reinforcement for this achievement. Patient was encouraged to continue putting in place healthy boundaries with negative people, places and things to maintain her happiness and health. 01/21/23 update- Patient's aunt will be taken off life support today which has triggered her grief. She reports that she is not feeling well and will not be able to go visit her aunt in the hospital to say goodbye which would have helped her gain some closure but her legs are really bothering her and are not healing. Grief management support provided. Patient continues to not want therapy even though it was strongly encouraged by [name] LCSW. [facility] LCSW will follow up in 60 days due to current family loss. Patient reports that she and her spouse have been  decorating for Halloween which brings them closeness and joy. 03/23/23- Patient has had several recent family losses. Most recently, patient's aunt (very close family member of hers) passed away on March 14, 2023 and she is experiencing severe grief. [facility] LCSW strongly encouraged patient to consider grief support as it would be very helpful in alleviating her symptoms but she declined as she is currently overwhelmed from multiple stressors. Patient reports that she is lonely and is not getting her usual support which were daily phone calls placed by family members since several of these individuals have now passed. Extensive emotional support provided during session. Patient would greatly  benefit from talk therapy (in person or virtual). [facility] LCSW sent patient grief support resources to consider. [facility] LCSW will follow up in 60 days.   Managing Loss, Adult People experience loss in many different ways throughout their lives. Events such as moving, changing jobs, and losing friends can create a sense of loss. The loss may be as serious as a major health change, divorce, death of a pet, or death of a loved one. All of these types of loss are likely to create a physical and emotional reaction known as grief. Grief is the result of a major change or an absence of something or someone that you count on. Grief is a normal reaction to loss. A variety of factors can affect your grieving experience, including: The nature of your loss. Your relationship to what or whom you lost. Your understanding of grief and how to manage it. Your support system. How to manage lifestyle changes Keep to your normal routine as much as possible. If you have trouble focusing or doing normal activities, it is acceptable to take some time away from your normal routine. Spend time with friends and loved ones. Eat a healthy diet, get plenty of sleep, and rest when you feel tired. How to recognize changes  The way that you deal with your grief will affect your ability to function as you normally do. When grieving, you may experience these changes: Numbness, shock, sadness, anxiety, anger, denial, and guilt. Thoughts about death. Unexpected crying. A physical sensation of emptiness in your stomach. Problems sleeping and eating. Tiredness (fatigue). Loss of interest in normal activities. Dreaming about or imagining seeing the person who died. A need to remember what or whom you lost. Difficulty thinking about anything other than your loss for a period of time. Relief. If you have been expecting the loss for a while, you may feel a sense of relief when it happens. Follow these instructions at home:     Activity Express your feelings in healthy ways, such as: Talking with others about your loss. It may be helpful to find others who have had a similar loss, such as a support group. Writing down your feelings in a journal. Doing physical activities to release stress and emotional energy. Doing creative activities like painting, sculpting, or playing or listening to music. Practicing resilience. This is the ability to recover and adjust after facing challenges. Reading some resources that encourage resilience may help you to learn ways to practice those behaviors. General instructions Be patient with yourself and others. Allow the grieving process to happen, and remember that grieving takes time. It is likely that you may never feel completely done with some grief. You may find a way to move on while still cherishing memories and feelings about your loss. Accepting your loss is a process. It can take months or longer to adjust. Keep all follow-up  visits as told by your health care provider. This is important. Where to find support To get support for managing loss: Ask your health care provider for help and recommendations, such as grief counseling or therapy. Think about joining a support group for people who are managing a loss. Where to find more information You can find more information about managing loss from: American Society of Clinical Oncology: www.cancer.net American Psychological Association: [employer] Contact a health care provider if: Your grief is extreme and keeps getting worse. You have ongoing grief that does not improve. Your body shows symptoms of grief, such as illness. You feel depressed, anxious, or lonely. Get help right away if: You have thoughts about hurting yourself or others. If you ever feel like you may hurt yourself or others, or have thoughts about taking your own life, get help right away. You can go to your nearest emergency department or call: Your  local emergency services (911 in the [place] A suicide crisis helpline, such as the National Suicide Prevention Lifeline at (212)013-9172. This is open 24 hours a day. Summary Grief is the result of a major change or an absence of someone or something that you count on. Grief is a normal reaction to loss. The depth of grief and the period of recovery depend on the type of loss and your ability to adjust to the change and process your feelings. Processing grief requires patience and a willingness to accept your feelings and talk about your loss with people who are supportive. It is important to find resources that work for you and to realize that people experience grief differently. There is not one grieving process that works for everyone in the same way. Be aware that when grief becomes extreme, it can lead to more severe issues like isolation, depression, anxiety, or suicidal thoughts. Talk with your health care provider if you have any of these issues. This information is not intended to replace advice given to you by your health care provider. Make sure you discuss any questions you have with your health care provider. Document Revised: 06/17/2018 Document Reviewed: 08/27/2016 Elsevier Patient Education  2020 [employer].  Help for Managing Grief   When you are experiencing grief, many resources and support are available to help you. You are not alone.    Grief Share (https://www.[email or web address].gl):    [employer] of [address] [place], [place]      [employer]  [phone] [address] [place], [place]     [employer] [facility] [address] [place], [place]      [facility] [address] [place], [place]  [phone]   [facility] [address] [place], [place] [phone]   [facility] [address] [place], [place]     [phone]   [facility] [address]  [place], [place]     [phone]   If a Hospice or Palliative Care team has been involved in the care of your loved one, you may reach out to your contact there or locally, you may reach out to [facility]:    [facility]   [address] [place], [place] [address] [phone] Patient was educated on healthy self-care as she admits she has little motivation to walk lately. PCS recommendation was suggested.   Patient Self Care Activities:  Attends all scheduled provider appointments Calls provider office  for new concerns or questions  Please see past updates related to this goal by clicking on the "Past Updates" button in the selected goal   Follow up goal     Follow up:  Patient agrees to Care Plan and Follow-up.  Plan: The Managed Medicaid care management team will reach out to the patient again over the next 30 days.  [name], BSW, MSW, LCSW Licensed Clinical Social Worker [employer]   [facility] [email or web address] Direct Dial: [phone]

## 2023-03-23 NOTE — Patient Instructions (Signed)
Visit Information  Cheryl Chandler was given information about Medicaid Managed Care team care coordination services as a part of their [facility] Medicaid benefit. Cheryl Chandler verbally consented to engagement with the [facility] Managed Care team.   If you are experiencing a medical emergency, please call 911 or report to your local emergency department or urgent care.   If you have a non-emergency medical problem during routine business hours, please contact your provider's office and ask to speak with a nurse.   For questions related to your [facility] health plan, please call: [phone] or go here:[email or web address]  If you would like to schedule transportation through your [facility] plan, please call the following number at least 2 days in advance of your appointment: [phone].   You can also use the MTM portal or MTM mobile app to manage your rides. Reimbursement for transportation is available through [facility]! For the portal, please go to [email or web address]  Call the [facility] Crisis Line at [phone], at any time, 24 hours a day, 7 days a week. If you are in danger or need immediate medical attention call 911.  If you would like help to quit smoking, call 1-800-QUIT-NOW (214-185-2999) OR Espaol: 1-855-Djelo-Ya (2-725-366-4403) o para ms informacin haga clic aqu or Text READY to 474-259 to register via text  Following is a copy of your plan of care:  Care Plan : General Social Work (Adult)  Updates made by Gustavus Bryant, LCSW since 03/23/2023 12:00 AM     Problem: Coping Skills (General Plan of Care)      Long-Range Goal: Coping Skills Enhanced   Start Date: 11/07/2020  Expected End Date: 03/07/2022  Recent Progress: On track  Priority: High  Note:   Timeframe:  Long-Range Goal Priority:  High Start Date:       08/26/21                 Expected End Date:  ongoing  Follow up date- 05/04/23 at 11 am   Current Barriers:   Financial constraints Mental Health Concerns  Social Isolation Limited education about mental health support resources that are available to her within the local area* Cognitive Deficits Lacks knowledge of community resource   Clinical Social Work Clinical Goal(s):  Over the next 90 days, client will work with SW to address concerns related to experiencing ongoing symptoms of depression, sadness and grief since the passing of her mother Over the next 90 days, patient will work with LCSW to address needs related to implementing appropriate self-care and depression management.    Managing Loss, Adult People experience loss in many different ways throughout their lives. Events such as moving, changing jobs, and losing friends can create a sense of loss. The loss may be as serious as a major health change, divorce, death of a pet, or death of a loved one. All of these types of loss are likely to create a physical and emotional reaction known as grief. Grief is the result of a major change or an absence of something or someone that you count on. Grief is a normal reaction to loss. A variety of factors can affect your grieving experience, including: The nature of your loss. Your relationship to what or whom you lost. Your understanding of grief and how to manage it. Your support system. How to manage lifestyle changes Keep to your normal routine as much as possible. If you have trouble focusing or doing normal activities, it is acceptable to take some time away  from your normal routine. Spend time with friends and loved ones. Eat a healthy diet, get plenty of sleep, and rest when you feel tired. How to recognize changes  The way that you deal with your grief will affect your ability to function as you normally do. When grieving, you may experience these changes: Numbness, shock, sadness, anxiety, anger, denial, and guilt. Thoughts about death. Unexpected crying. A physical sensation of  emptiness in your stomach. Problems sleeping and eating. Tiredness (fatigue). Loss of interest in normal activities. Dreaming about or imagining seeing the person who died. A need to remember what or whom you lost. Difficulty thinking about anything other than your loss for a period of time. Relief. If you have been expecting the loss for a while, you may feel a sense of relief when it happens. Follow these instructions at home:    Activity Express your feelings in healthy ways, such as: Talking with others about your loss. It may be helpful to find others who have had a similar loss, such as a support group. Writing down your feelings in a journal. Doing physical activities to release stress and emotional energy. Doing creative activities like painting, sculpting, or playing or listening to music. Practicing resilience. This is the ability to recover and adjust after facing challenges. Reading some resources that encourage resilience may help you to learn ways to practice those behaviors. General instructions Be patient with yourself and others. Allow the grieving process to happen, and remember that grieving takes time. It is likely that you may never feel completely done with some grief. You may find a way to move on while still cherishing memories and feelings about your loss. Accepting your loss is a process. It can take months or longer to adjust. Keep all follow-up visits as told by your health care provider. This is important. Where to find support To get support for managing loss: Ask your health care provider for help and recommendations, such as grief counseling or therapy. Think about joining a support group for people who are managing a loss. Where to find more information You can find more information about managing loss from: American Society of Clinical Oncology: www.cancer.net American Psychological Association: DiceTournament.ca Contact a health care provider if: Your  grief is extreme and keeps getting worse. You have ongoing grief that does not improve. Your body shows symptoms of grief, such as illness. You feel depressed, anxious, or lonely. Get help right away if: You have thoughts about hurting yourself or others. If you ever feel like you may hurt yourself or others, or have thoughts about taking your own life, get help right away. You can go to your nearest emergency department or call: Your local emergency services (911 in the [place] A suicide crisis helpline, such as the National Suicide Prevention Lifeline at 201-404-6688. This is open 24 hours a day. Summary Grief is the result of a major change or an absence of someone or something that you count on. Grief is a normal reaction to loss. The depth of grief and the period of recovery depend on the type of loss and your ability to adjust to the change and process your feelings. Processing grief requires patience and a willingness to accept your feelings and talk about your loss with people who are supportive. It is important to find resources that work for you and to realize that people experience grief differently. There is not one grieving process that works for everyone in the same way. Be aware  that when grief becomes extreme, it can lead to more severe issues like isolation, depression, anxiety, or suicidal thoughts. Talk with your health care provider if you have any of these issues. This information is not intended to replace advice given to you by your health care provider. Make sure you discuss any questions you have with your health care provider. Document Revised: 06/17/2018 Document Reviewed: 08/27/2016 Elsevier Patient Education  2020 [employer].  Help for Managing Grief   When you are experiencing grief, many resources and support are available to help you. You are not alone.    Grief Share ([email or web address]):    [employer] of [address] [place], [place]      [facility]  [phone] [address] [place], [place]     [facility] [address] [place], [place]      [facility] [address] [place], [place]  [phone]   [facility] [address] [place], [place] [phone]   [facility] [address] [place], [place]     [phone]   [facility] [address] [place], [place]     [phone]   If a Hospice or Palliative Care team has been involved in the care of your loved one, you may reach out to your contact there or locally, you may reach out to [facility]:    [facility]   [address] [place], [place] [address] [phone] Patient was educated on healthy self-care as she admits she has little motivation to walk lately. PCS recommendation was suggested.   Patient Self Care Activities:  Attends all scheduled provider appointments Calls provider office for new concerns or questions  Please see past updates related to this goal by clicking on the "Past Updates" button in the selected goal   Follow up goal  The following coping skill education was provided for stress relief and mental health management: "When your car dies or a deadline looms, how do you respond? Long-term, low-grade or acute stress takes a serious toll on your body and mind, so don't ignore feelings of constant tension. Stress is a natural part of life. However, too much stress can harm our health, especially if it continues every day. This is chronic stress and can put you at risk for heart problems like heart disease and depression. Understand what's happening inside your body and learn simple coping skills to combat the negative impacts of everyday stressors.  Types of Stress There are two types of stress: Emotional - types of emotional stress are relationship problems, pressure at  work, financial worries, experiencing discrimination or having a major life change. Physical - Examples of physical stress include being sick having pain, not sleeping well, recovery from an injury or having an alcohol and drug use disorder. Fight or Flight Sudden or ongoing stress activates your nervous system and floods your bloodstream with adrenaline and cortisol, two hormones that raise blood pressure, increase heart rate and spike blood sugar. These changes pitch your body into a fight or flight response. That enabled our ancestors to outrun saber-toothed tigers, and it's helpful today for situations like dodging a car accident. But most modern chronic stressors, such as finances or a challenging relationship, keep your body in that heightened state, which hurts your health. Effects of Too Much Stress If constantly under stress, most of [place] will eventually  start to function less well.  Multiple studies link chronic stress to a higher risk of heart disease, stroke, depression, weight gain, memory loss and even premature death, so it's important to recognize the warning signals. Talk to your doctor about ways to manage stress if you're experiencing any of these symptoms: Prolonged periods of poor sleep. Regular, severe headaches. Unexplained weight loss or gain. Feelings of isolation, withdrawal or worthlessness. Constant anger and irritability. Loss of interest in activities. Constant worrying or obsessive thinking. Excessive alcohol or drug use. Inability to concentrate.  10 Ways to Cope with Chronic Stress It's key to recognize stressful situations as they occur because it allows you to focus on managing how you react. We all need to know when to close our eyes and take a deep breath when we feel tension rising. Use these tips to prevent or reduce chronic stress. 1. Rebalance Work and Home All work and no play? If you're spending too much time at the office, intentionally put more dates in  your calendar to enjoy time for fun, either alone or with others. 2. Get Regular Exercise Moving your body on a regular basis balances the nervous system and increases blood circulation, helping to flush out stress hormones. Even a daily 20-minute walk makes a difference. Any kind of exercise can lower stress and improve your mood ? just pick activities that you enjoy and make it a regular habit. 3. Eat Well and Limit Alcohol and Stimulants Alcohol, nicotine and caffeine may temporarily relieve stress but have negative health impacts and can make stress worse in the long run. Well-nourished bodies cope better, so start with a good breakfast, add more organic fruits and vegetables for a well-balanced diet, avoid processed foods and sugar, try herbal tea and drink more water. 4. Connect with Supportive People Talking face to face with another person releases hormones that reduce stress. Lean on those good listeners in your life. 5. Carve Out Hobby Time Do you enjoy gardening, reading, listening to music or some other creative pursuit? Engage in activities that bring you pleasure and joy; research shows that reduces stress by almost half and lowers your heart rate, too. 6. Practice Meditation, Stress Reduction or Yoga Relaxation techniques activate a state of restfulness that counterbalances your body's fight-or-flight hormones. Even if this also means a 10-minute break in a long day: listen to music, read, go for a walk in nature, do a hobby, take a bath or spend time with a friend. Also consider doing a mindfulness exercise or try a daily deep breathing or imagery practice. Deep Breathing Slow, calm and deep breathing can help you relax. Try these steps to focus on your breathing and repeat as needed. Find a comfortable position and close your eyes. Exhale and drop your shoulders. Breathe in through your nose; fill your lungs and then your belly. Think of relaxing your body, quieting your mind and  becoming calm and peaceful. Breathe out slowly through your nose, relaxing your belly. Think of releasing tension, pain, worries or distress. Repeat steps three and four until you feel relaxed. Imagery This involves using your mind to excite the senses -- sound, vision, smell, taste and feeling. This may help ease your stress. Begin by getting comfortable and then do some slow breathing. Imagine a place you love being at. It could be somewhere from your childhood, somewhere you vacationed or just a place in your imagination. Feel how it is to be in the place you're imagining. Pay attention to  the sounds, air, colors, and who is there with you. This is a place where you feel cared for and loved. All is well. You are safe. Take in all the smells, sounds, tastes and feelings. As you do, feel your body being nourished and healed. Feel the calm that surrounds you. Breathe in all the good. Breathe out any discomfort or tension. 7. Sleep Enough If you get less than seven to eight hours of sleep, your body won't tolerate stress as well as it could. If stress keeps you up at night, address the cause, and add extra meditation into your day to make up for the lost z's. Try to get seven to nine hours of sleep each night. Make a regular bedtime schedule. Keep your room dark and cool. Try to avoid computers, TV, cell phones and tablets before bed. 8. Bond with Connections You Enjoy Go out for a coffee with a friend, chat with a neighbor, call a family member, visit with a clergy member, or even hang out with your pet. Clinical studies show that spending even a short time with a companion animal can cut anxiety levels almost in half. 9. Take a Vacation Getting away from it all can reset your stress tolerance by increasing your mental and emotional outlook, which makes you a happier, more productive person upon return. Leave your cellphone and laptop at home! 10. See a Counselor, Coach or Therapist If negative  thoughts overwhelm your ability to make positive changes, it's time to seek professional help. Make an appointment today--your health and life are worth it."  [name], BSW, MSW, LCSW Licensed Clinical Social Worker [employer]   [facility] [email or web address] Direct Dial: [phone]

## 2023-03-24 ENCOUNTER — Ambulatory Visit (INDEPENDENT_AMBULATORY_CARE_PROVIDER_SITE_OTHER): Payer: Medicaid Other | Admitting: Nurse Practitioner

## 2023-03-28 DIAGNOSIS — Z419 Encounter for procedure for purposes other than remedying health state, unspecified: Secondary | ICD-10-CM | POA: Diagnosis not present

## 2023-03-29 NOTE — Addendum Note (Signed)
Addended by: Ginette Pitman on: 03/29/2023 02:11 PM   Modules accepted: Orders

## 2023-03-29 NOTE — Telephone Encounter (Signed)
I don't believe [facility] can complete the needed eval for power wheelchairs, which is what the referral will do. Happy to chat more in person. Jolene, had mentioned some concerns that this always seemed to be a hassle and I believe the eval is the missing piece.   Katie

## 2023-03-29 NOTE — Telephone Encounter (Signed)
I signed the order. However, this has already been completed and faxed to [employer]

## 2023-03-29 NOTE — Telephone Encounter (Signed)
Can you co-sign the orders for the wheelchair eval through neuro PT. Thanks, [employer]

## 2023-03-30 NOTE — Telephone Encounter (Signed)
If you could get in contact with [facility] and see where we are with the wheelchair, that would beg reat.

## 2023-03-30 NOTE — Telephone Encounter (Signed)
Unable to reach patient, voicemail left. Patient returned my call and I was able to speak with her. She is now aware that [facility] has obtained authorization for the hospital bed and should be working on arranging delivery. For the power wheelchair they are now waiting on authorization from insurance but it is in process.

## 2023-04-01 ENCOUNTER — Telehealth: Payer: Self-pay | Admitting: Nurse Practitioner

## 2023-04-01 ENCOUNTER — Other Ambulatory Visit: Payer: Self-pay | Admitting: Obstetrics and Gynecology

## 2023-04-01 ENCOUNTER — Other Ambulatory Visit: Payer: Self-pay | Admitting: Nurse Practitioner

## 2023-04-01 DIAGNOSIS — K219 Gastro-esophageal reflux disease without esophagitis: Secondary | ICD-10-CM | POA: Diagnosis not present

## 2023-04-01 DIAGNOSIS — F32A Depression, unspecified: Secondary | ICD-10-CM | POA: Diagnosis not present

## 2023-04-01 DIAGNOSIS — J9611 Chronic respiratory failure with hypoxia: Secondary | ICD-10-CM | POA: Diagnosis not present

## 2023-04-01 DIAGNOSIS — I10 Essential (primary) hypertension: Secondary | ICD-10-CM | POA: Diagnosis not present

## 2023-04-01 DIAGNOSIS — I890 Lymphedema, not elsewhere classified: Secondary | ICD-10-CM | POA: Diagnosis not present

## 2023-04-01 DIAGNOSIS — Z6841 Body Mass Index (BMI) 40.0 and over, adult: Secondary | ICD-10-CM | POA: Diagnosis not present

## 2023-04-01 DIAGNOSIS — E1140 Type 2 diabetes mellitus with diabetic neuropathy, unspecified: Secondary | ICD-10-CM | POA: Diagnosis not present

## 2023-04-01 DIAGNOSIS — Z9981 Dependence on supplemental oxygen: Secondary | ICD-10-CM | POA: Diagnosis not present

## 2023-04-01 DIAGNOSIS — F419 Anxiety disorder, unspecified: Secondary | ICD-10-CM | POA: Diagnosis not present

## 2023-04-01 DIAGNOSIS — E039 Hypothyroidism, unspecified: Secondary | ICD-10-CM | POA: Diagnosis not present

## 2023-04-01 DIAGNOSIS — J4489 Other specified chronic obstructive pulmonary disease: Secondary | ICD-10-CM | POA: Diagnosis not present

## 2023-04-01 DIAGNOSIS — E785 Hyperlipidemia, unspecified: Secondary | ICD-10-CM | POA: Diagnosis not present

## 2023-04-01 NOTE — Telephone Encounter (Signed)
Called and LVM giving verbal orders per [name].

## 2023-04-01 NOTE — Telephone Encounter (Signed)
Home Health Verbal Orders - Caller/Agency: Sherene from [employer] Number: [phone] Service Requested: extend OT Therapy Frequency: 1x4 and every other week for 4 weeks Any new concerns about the patient? no   Caller states can leave approval on the voicemail that is secure

## 2023-04-01 NOTE — Patient Outreach (Signed)
Medicaid Managed Care   Nurse Care Manager Note  04/01/2023 Name:  Cheryl Chandler MRN:  [id number] DOB:  22-Feb-1966  Cheryl Chandler is an 57 y.o. year old female who is a primary patient of Cheryl Grooms, NP.  The [facility] Managed Care Coordination team was consulted for assistance with:    Chronic healthcare management follow up, OSA, GERD, arthritis, HLD, asthma, HTN, DM, COPD, anxiety/depression, lymphedema, neuropathy, obesity  Cheryl Chandler was given information about Medicaid Managed Care Coordination team services today. Cheryl Chandler Patient agreed to services and verbal consent obtained.  Engaged with patient by telephone for follow up visit in response to provider referral for case management and/or care coordination services.   Assessments/Interventions:  Review of past medical history, allergies, medications, health status, including review of consultants reports, laboratory and other test data, was performed as part of comprehensive evaluation and provision of chronic care management services.  SDOH (Social Determinants of Health) assessments and interventions performed: [name]    Flowsheet Row Patient Outreach Telephone from 04/01/2023 in [place] POPULATION HEALTH DEPARTMENT Most recent reading at 04/01/2023  2:44 PM Patient Outreach Telephone from 03/23/2023 in [place] POPULATION HEALTH DEPARTMENT Most recent reading at 03/23/2023 11:23 AM Patient Outreach Telephone from 03/08/2023 in [place] POPULATION HEALTH DEPARTMENT Most recent reading at 03/08/2023 12:23 PM Office Visit from 03/08/2023 in [facility] Most recent reading at 03/08/2023  8:20 AM Patient Outreach Telephone from 03/02/2023 in [place] POPULATION HEALTH DEPARTMENT Most recent reading at 03/02/2023  2:04 PM Office Visit from 02/09/2023 in [facility] Most recent reading at 02/09/2023  3:11 PM  [name]         Utilities Interventions Intervention Not Indicated -- -- -- -- --  Alcohol Usage Interventions -- -- Intervention Not Indicated (Score <7) -- -- --  Depression Interventions/Treatment  -- -- -- Medication, Currently on Treatment -- Medication  Financial Strain Interventions -- -- -- -- Other (Comment)  [patient given resources] --  Physical Activity Interventions -- -- -- -- Intervention Not Indicated, Other (Comments)  [not physically able to engae in this type of exercise] --  Stress Interventions -- [employer], Provide Counseling  [Aunt recently passed] -- -- -- --  Social Connections Interventions -- -- Intervention Not Indicated -- -- --  Health Literacy Interventions Intervention Not Indicated -- -- -- -- --     Care Plan Allergies  Allergen Reactions   Codeine Shortness Of Breath and Rash   Percocet [Oxycodone-Acetaminophen] Shortness Of Breath   Sulfa Antibiotics Shortness Of Breath and Rash   Aspirin Hives   Bee Venom     Bee stings   Darvon [Propoxyphene]     Darvocet-N 100   Latex    Oxycodone Nausea And Vomiting   Penicillins     Has patient had a PCN reaction causing immediate rash, facial/tongue/throat swelling, SOB or lightheadedness with hypotension: Unknown Has patient had a PCN reaction causing severe rash involving mucus membranes or skin necrosis: Unknown Has patient had a PCN reaction that required hospitalization: Unknown Has patient had a PCN reaction occurring within the last 10 years: Unknown If all of the above answers are "NO", then may proceed with Cephalosporin use.   Medications Reviewed Today     Reviewed by Cheryl Chandler, RN (Registered Nurse) on 04/01/23 at 1445  Med List Status: <None>   Medication Order Taking? Sig Documenting Provider Last Dose Status Informant  ACCU-CHEK  GUIDE test strip [id number] No USE TO CHECK BLOOD SUGAR 3 TIMES DAILY AS DIRECTED Cheryl Grooms, NP Taking Active Self, Other  Accu-Chek  Softclix Lancets lancets [id number] No CHECK BLOOD SUGAR TWICE DAILY Cheryl Grooms, NP Taking Active Self, Other  albuterol (PROVENTIL) (2.5 MG/3ML) 0.083% nebulizer solution [id number] No USE 1 VIAL VIA NEBULIZER EVERY 4 HOURS AS NEEDED Mecum, Erin E, PA-C Taking Active Self, Other  amLODipine (NORVASC) 5 MG tablet [id number] No Take 5 mg by mouth daily. [provider] Taking Active   atorvastatin (LIPITOR) 80 MG tablet [id number] No Take 1 tablet (80 mg total) by mouth daily. Mecum, Erin E, PA-C Taking Active Self, Other  Blood Glucose Monitoring Suppl (ACCU-CHEK GUIDE) w/Device KIT [id number] No Use to check blood sugar 3 times a day. Aura Dials T, NP Taking Active Self, Other  Blood Pressure Monitoring (OMRON 3 SERIES BP MONITOR) DEVI [id number] No Use to check blood pressure as directed Cheryl Grooms, NP Taking Active Self, Other  citalopram (CELEXA) 40 MG tablet [id number] No Take 1 tablet (40 mg total) by mouth daily. Mecum, Oswaldo Conroy, PA-C Taking Active Self, Other  diphenhydrAMINE (BENADRYL) 25 mg capsule [id number] No Take 25 mg by mouth every 6 (six) hours as needed for itching. Taking once a day [provider] Taking Active Self, Other  docusate sodium (COLACE) 50 MG capsule [id number]  Take 1 capsule (50 mg total) by mouth 2 (two) times daily. Cheryl Grooms, NP  Active   Dulaglutide (TRULICITY) 0.75 MG/0.5ML SOPN [id number] No 0.75 mg by ultrasound guided injection route once a week. Mecum, Oswaldo Conroy, PA-C Taking Active Self, Other  EPINEPHrine 0.3 mg/0.3 mL IJ SOAJ injection [id number] No INJECT 1 SYRINGE INTO OUTER THIGH ONCE AS NEEDED FOR SEVERE ALLERGIC REACTION. Mecum, Oswaldo Conroy, PA-C Taking Active Self, Other  Fluticasone-Umeclidin-Vilant (TRELEGY ELLIPTA) 100-62.5-25 MCG/ACT AEPB [id number] No Inhale 1 puff into the lungs daily. Salena Saner, MD Taking Active Self, Other  furosemide (LASIX) 20 MG tablet [id number] No Take 1 tablet (20 mg total) by mouth 2 (two)  times daily. Antonieta Iba, MD Taking Active Self, Other  gabapentin (NEURONTIN) 300 MG capsule [id number] No TAKE 1 CAPSULE BY MOUTH 3 TIMES DAILY ASNEEDED (NEED OFFICE VISIT FOR FURTHER REFILLS)  Patient taking differently: Take 300 mg by mouth 3 (three) times daily as needed.   Cheryl Grooms, NP Taking Active Self, Other  hydrOXYzine (VISTARIL) 25 MG capsule [id number] No Take 1 capsule (25 mg total) by mouth every 8 (eight) hours as needed. Mecum, Erin E, PA-C Taking Active Self, Other  levothyroxine (SYNTHROID) 50 MCG tablet [id number] No TAKE 1 TABLET BY MOUTH ONCE DAILY ON AN EMPTY STOMACH. WAIT 30 MINUTES BEFORE TAKING OTHER MEDS. Mecum, Erin E, PA-C Taking Active Self, Other  lisinopril (ZESTRIL) 40 MG tablet [id number] No TAKE 1 TABLET BY MOUTH ONCE DAILY Cheryl Grooms, NP Taking Active Self, Other  meloxicam (MOBIC) 15 MG tablet [id number] No TAKE 1 TABLET BY MOUTH ONCE DAILY AS NEEDED WITH FOOD OR MILK Cheryl Grooms, NP Taking Active Self, Other           Med Note Littie Deeds, CHERYL A   Fri Aug 07, 2022  9:13 AM) Taking daily  montelukast (SINGULAIR) 10 MG tablet [id number] No TAKE 1 TABLET BY MOUTH AT BEDTIME Cheryl Grooms, NP Taking Active Self, Other  nitroGLYCERIN (NITROSTAT) 0.4 MG SL tablet [id number] No DISSOLVE 1 TABLET UNDER TONGUE AT ONSET OF CHEST PAIN. REPEAT IN 5 MIN IF NOT RESOLVED, MAX 3  DOSES. 911 IF NEEDED. Cheryl Grooms, NP Taking Active Self, Other  omeprazole (PRILOSEC) 20 MG capsule [id number] No TAKE 1 CAPSULE BY MOUTH ONCE DAILY Cheryl Grooms, NP Taking Active Self, Other  OXYGEN [id number] No Inhale 3 L into the lungs daily. [provider] Taking Active Self, Other  Silver (AQUACEL AG FOAM) 8"X8" PADS [id number] No Apply to areas of ulceration on legs as directed Sunnie Nielsen, DO Taking Active   spironolactone (ALDACTONE) 25 MG tablet [id number] No Take 0.5 tablets (12.5 mg total) by mouth daily. Cheryl Grooms, NP Taking Active  Self, Other  Vitamin D, Ergocalciferol, (DRISDOL) 1.25 MG (50000 UNIT) CAPS capsule [id number] No TAKE 1 CAPSULE BY MOUTH EVERY 7 DAYS Cheryl Grooms, NP Taking Active Self, Other  Wound Cleansers (VASHE WOUND) 0.033 % SOLN [id number] No Apply 1 Application topically daily. Use as directed to cleanse lower legs skin Sunnie Nielsen, DO Taking Active            Patient Active Problem List   Diagnosis Date Noted   Dermatitis 02/02/2023   Cellulitis 02/01/2023   Venous ulcer of ankle, left (HCC) 11/16/2022   Moderate episode of recurrent major depressive disorder (HCC) 08/03/2022   Controlled type 2 diabetes mellitus with diabetic polyneuropathy, without long-term current use of insulin (HCC) 08/03/2022   Venous ulcer (HCC) 05/03/2022   Foot pain, right 09/01/2021   Fibrocystic disease of both breasts 05/07/2019   Chronic venous insufficiency 11/09/2018   Overactive bladder 09/30/2018   Peripheral neuropathy 09/30/2018   Apnea 01/02/2016   Lymphedema 11/30/2014   Dyspnea on exertion 11/30/2014   Anxiety 11/29/2014   Arthritis 11/29/2014   COPD (chronic obstructive pulmonary disease) (HCC) 11/29/2014   Clinical depression 11/29/2014   H/O eating disorder 11/29/2014   Esophageal reflux 11/29/2014   Acne inversa 11/29/2014   Essential hypertension 11/29/2014   HLD (hyperlipidemia) 11/29/2014   Adult hypothyroidism 11/29/2014   Insomnia 11/29/2014   Low back pain 11/29/2014   Morbid obesity (HCC) 11/29/2014   Posttraumatic stress disorder 11/29/2014   Vitamin D deficiency 11/29/2014   Nicotine dependence, cigarettes, uncomplicated 11/29/2014   Conditions to be addressed/monitored per PCP order:  Chronic healthcare management follow up, OSA, GERD, arthritis, HLD, asthma, HTN, DM, COPD, anxiety/depression, lymphedema, neuropathy, obesity  Care Plan : RNCM: COPD (Adult)  Updates made by Cheryl Chandler, RN since 04/01/2023 12:00 AM     Problem: RNCM: Psychological Adjustment  to Diagnosis (COPD)   Priority: Medium  Onset Date: 05/22/2020     Long-Range Goal: RNCM: Adjustment to Disease Achieved   Start Date: 05/22/2020  Expected End Date: 06/02/2023  Recent Progress: On track  Priority: High  Note:   Current Barriers:  Knowledge deficits related to basic understanding of COPD disease process Knowledge deficits related to basic COPD self care/management Knowledge deficit related to basic understanding of how to use inhalers and how inhaled medications work Knowledge deficit related to importance of energy conservation Limited Social Support Unable to independently manage COPD Lacks social connections Does not contact provider office for questions/concerns  04/01/23:  On 3L, continuous-PULM appt r/s  To f/u with [facility] regarding disability.  Check with [facility] about an appt.  [facility] weekly, RN and PT?OT.  Awaiting w/c.   Case Manager Clinical Goal(s): patient will report using inhalers as prescribed including rinsing mouth after use patient will be able to verbalize understanding of COPD action plan and when to seek appropriate levels of medical care  patient will engage in lite  exercise as tolerated to build/regain stamina and strength and reduce shortness of breath through activity tolerance  patient will verbalize basic understanding of COPD disease process and self care activities   Interventions:  Inter-disciplinary care team collaboration (see longitudinal plan of care) UNABLE to independently: manage COPD Provided patient with basic written and verbal COPD education on self care/management/and exacerbation prevention Provided patient with COPD action plan and reinforced importance of daily self assessment.  Provided written and verbal instructions on pursed lip breathing and utilized returned demonstration as teach back Provided instruction about proper use of medications used for management of COPD including inhalers Advised patient  to self assesses COPD action plan zone and make appointment with provider if in the yellow zone for 48 hours without improvement. Provided patient with education about the role of exercise in the management of COPD Advised patient to engage in light exercise as tolerated 3-5 days a week Provided education about and advised patient to utilize infection prevention strategies to reduce risk of respiratory infection. 03/08/23: RNCM called patient to let her know DME ordered through [facility].  Patient states she saw PCP and is aware it is being ordered.  Patient also stated she is constipated-discussed stool softeners, increasing fluid and fiber intake.  Patient states she will call her PCP to follow up.  All questions answered at this time. 04/01/23: Wheelchair has been ordered  Patient Goals/Self-Care Activities:  - decision-making supported - depression screen reviewed - emotional support provided - family involvement promoted - problem-solving facilitated - relaxation techniques promoted - verbalization of feelings encouraged  Follow Up Plan: Telephone follow up appointment with care management team member scheduled.   Care Plan : RNCM: Depression (Adult)  Updates made by Cheryl Chandler, RN since 04/01/2023 12:00 AM     Problem: RNCM: Symptoms (Depression)   Priority: High  Onset Date: 12/02/2020     Long-Range Goal: RNCM: Symptoms Monitored and Managed   Start Date: 12/02/2020  Expected End Date: 05/21/2023  Recent Progress: Not on track  Priority: High  Note:   Current Barriers:  Ineffective Self Health Maintenance in a patient with Anxiety and Depression Unable to independently manage depression and anxiety Does not attend all scheduled provider appointments Lacks social connections Unable to perform IADLs independently Does not contact provider office for questions/concerns 04/01/23:  Declines services, LCSW given resources and following  Clinical Goal(s):  Collaboration  with Cheryl Grooms, NP regarding development and update of comprehensive plan of care as evidenced by provider attestation and co-signature Inter-disciplinary care team collaboration (see longitudinal plan of care) patient will work with care management team to address care coordination and chronic disease management needs related to Disease Management Educational Needs Care Coordination Mental Health Counseling Level of Care Concerns    Interventions:  Evaluation of current treatment plan related to Anxiety and Depression, Financial constraints related to insurance not approving care for the patient to go to wound care , Limited social support, Level of care concerns, ADL IADL limitations, Mental Health Concerns , Family and relationship dysfunction, Substance abuse issues -   , and Inability to perform IADL's independently self-management and patient's adherence to plan as established by provider. Inter-disciplinary care team collaboration (see longitudinal plan of care) Discussed plans with patient for ongoing care management follow up and provided patient with direct contact information for care management team Evaluation of depression and anxiety.  LCSW referral for increased stress-completed Collaborated with LCSW   Self Care Activities:  Patient verbalizes understanding of plan  to effectively manage depression and anxiety Self administers medications as prescribed Attends all scheduled provider appointments Calls pharmacy for medication refills Attends church or other social activities Performs ADL's independently Performs IADL's independently Calls provider office for new concerns or questions Work with the [facility] team to meet needs for continued CCM services   Patient Goals: - activity or exercise based on tolerance encouraged - depression screen reviewed - emotional support provided - healthy lifestyle promoted - medication side effects monitored and managed - pain  managed - participation in mental health treatment encouraged - quality of sleep assessed - response to mental health treatment monitored - response to pharmacologic therapy monitored - sleep hygiene techniques encouraged - social activities and relationships encouraged - substance use assessed Follow Up Plan: The care management team will reach out to the patient again over the next 90 business days.   Follow Up:  Patient agrees to Care Plan and Follow-up.  Plan: The Managed Medicaid care management team will reach out to the patient again over the next 30 business  days. and The  Patient has been provided with contact information for the Managed Medicaid care management team and has been advised to call with any health related questions or concerns.  Date/time of next scheduled RN care management/care coordination outreach: 05/04/23 at 0900.

## 2023-04-01 NOTE — Patient Instructions (Signed)
Visit Information  Ms. Beh was given information about Medicaid Managed Care team care coordination services as a part of their [facility] Medicaid benefit. Roopa Dondi Clarida verbally consented to engagement with the [facility] Managed Care team.   If you are experiencing a medical emergency, please call 911 or report to your local emergency department or urgent care.   If you have a non-emergency medical problem during routine business hours, please contact your provider's office and ask to speak with a nurse.   For questions related to your [facility] health plan, please call: [phone] or go here:[email or web address]  If you would like to schedule transportation through your [facility] plan, please call the following number at least 2 days in advance of your appointment: [phone].   You can also use the MTM portal or MTM mobile app to manage your rides. Reimbursement for transportation is available through [facility]! For the portal, please go to [email or web address]  Call the [facility] Crisis Line at [phone], at any time, 24 hours a day, 7 days a week. If you are in danger or need immediate medical attention call 911.  If you would like help to quit smoking, call 1-800-QUIT-NOW (725-153-7718) OR Espaol: 1-855-Djelo-Ya (2-595-638-7564) o para ms informacin haga clic aqu or Text READY to 332-951 to register via text  Ms. Disano - following are the goals we discussed in your visit today:   Goals Addressed             This Visit's Progress    RNCM: Keep Skin Clean and Dry       Follow Up Date: 04/01/23  - clean and dry skin well - keep skin dry - use a fragrance-free lotion on skin - wear a protective pad or garment    Why is this important?   Leaking urine (pee) can cause soreness from skin rashes and redness.  This is caused by skin being exposed to urine (pee).  04/01/23:  No complaints today     RNCM: Track and Manage My  Symptoms-Depression       Timeframe:  Long-Range Goal Priority:  High Start Date:    12-02-2020                         Expected End Date:   ongoing                Follow Up Date: 05/03/22   - avoid negative self-talk - develop a personal safety plan - develop a plan to deal with triggers like holidays, anniversaries - exercise at least 2 to 3 times per week - have a plan for how to handle bad days - journal feelings and what helps to feel better or worse - spend time or talk with others at least 2 to 3 times per week - spend time or talk with others every day - watch for early signs of feeling worse    Why is this important?   Keeping track of your progress will help your treatment team find the right mix of medicine and therapy for you.  Write in your journal every day.  Day-to-day changes in depression symptoms are normal. It may be more helpful to check your progress at the end of each week instead of every day.   04/01/23:  Declines services-given resources by LCSW     RNCM: Track and Manage My Triggers-COPD       Timeframe:  Long-Range Goal Priority:  Medium Start Date:  05-22-2020                        Expected End Date:   ongoing          Follow Up Date: 05/04/23   - avoid second hand smoke - eliminate smoking in my home - identify and avoid work-related triggers - identify and remove indoor air pollutants - limit outdoor activity during cold weather - listen for public air quality announcements every day    Why is this important?   Triggers are activities or things, like tobacco smoke or cold weather, that make your COPD (chronic obstructive pulmonary disease) flare-up.  Knowing these triggers helps you plan how to stay away from them.  When you cannot remove them, you can learn how to manage them.   04/01/23: 3L Oxygen, continuous.  PULM appt r/s    Patient verbalizes understanding of instructions and care plan provided today and agrees to view in MyChart. Active  MyChart status and patient understanding of how to access instructions and care plan via MyChart confirmed with patient.     The Managed Medicaid care management team will reach out to the patient again over the next 30 business  days.  The  Patient  has been provided with contact information for the Managed Medicaid care management team and has been advised to call with any health related questions or concerns.   Kathi Der RN, BSN [place]  [facility] Care Management Coordinator - Managed Medicaid High Risk [phone]   Following is a copy of your plan of care:  Care Plan : RNCM: COPD (Adult)  Updates made by [name], RN since 04/01/2023 12:00 AM     Problem: RNCM: Psychological Adjustment to Diagnosis (COPD)   Priority: Medium  Onset Date: 05/22/2020     Long-Range Goal: RNCM: Adjustment to Disease Achieved   Start Date: 05/22/2020  Expected End Date: 06/02/2023  Recent Progress: On track  Priority: High  Note:   Current Barriers:  Knowledge deficits related to basic understanding of COPD disease process Knowledge deficits related to basic COPD self care/management Knowledge deficit related to basic understanding of how to use inhalers and how inhaled medications work Knowledge deficit related to importance of energy conservation Limited Social Support Unable to independently manage COPD Lacks social connections Does not contact provider office for questions/concerns  04/01/23:  On 3L, continuous-PULM appt r/s  To f/u with [facility] regarding disability.  Check with [facility] about an appt.  [facility] weekly, RN and PT?OT.  Awaiting w/c.   Case Manager Clinical Goal(s): patient will report using inhalers as prescribed including rinsing mouth after use patient will be able to verbalize understanding of COPD action plan and when to seek appropriate levels of medical care  patient will engage in lite exercise as tolerated to build/regain  stamina and strength and reduce shortness of breath through activity tolerance  patient will verbalize basic understanding of COPD disease process and self care activities   Interventions:  Inter-disciplinary care team collaboration (see longitudinal plan of care) UNABLE to independently: manage COPD Provided patient with basic written and verbal COPD education on self care/management/and exacerbation prevention Provided patient with COPD action plan and reinforced importance of daily self assessment.  Provided written and verbal instructions on pursed lip breathing and utilized returned demonstration as teach back Provided instruction about proper use of medications used for management of COPD including inhalers Advised patient to self assesses  COPD action plan zone and make appointment with provider if in the yellow zone for 48 hours without improvement. Provided patient with education about the role of exercise in the management of COPD Advised patient to engage in light exercise as tolerated 3-5 days a week Provided education about and advised patient to utilize infection prevention strategies to reduce risk of respiratory infection. 03/08/23: RNCM called patient to let her know DME ordered through [facility].  Patient states she saw PCP and is aware it is being ordered.  Patient also stated she is constipated-discussed stool softeners, increasing fluid and fiber intake.  Patient states she will call her PCP to follow up.  All questions answered at this time. 04/01/23: Wheelchair has been ordered  Patient Goals/Self-Care Activities:  - decision-making supported - depression screen reviewed - emotional support provided - family involvement promoted - problem-solving facilitated - relaxation techniques promoted - verbalization of feelings encouraged  Follow Up Plan: Telephone follow up appointment with care management team member scheduled.   Care Plan : RNCM: Depression (Adult)   Updates made by [name], RN since 04/01/2023 12:00 AM     Problem: RNCM: Symptoms (Depression)   Priority: High  Onset Date: 12/02/2020     Long-Range Goal: RNCM: Symptoms Monitored and Managed   Start Date: 12/02/2020  Expected End Date: 05/21/2023  Recent Progress: Not on track  Priority: High  Note:   Current Barriers:  Ineffective Self Health Maintenance in a patient with Anxiety and Depression Unable to independently manage depression and anxiety Does not attend all scheduled provider appointments Lacks social connections Unable to perform IADLs independently Does not contact provider office for questions/concerns 04/01/23:  Declines services, LCSW given resources and following  Clinical Goal(s):  Collaboration with [name], NP regarding development and update of comprehensive plan of care as evidenced by provider attestation and co-signature Inter-disciplinary care team collaboration (see longitudinal plan of care) patient will work with care management team to address care coordination and chronic disease management needs related to Disease Management Educational Needs Care Coordination Mental Health Counseling Level of Care Concerns    Interventions:  Evaluation of current treatment plan related to Anxiety and Depression, Financial constraints related to insurance not approving care for the patient to go to wound care , Limited social support, Level of care concerns, ADL IADL limitations, Mental Health Concerns , Family and relationship dysfunction, Substance abuse issues -   , and Inability to perform IADL's independently self-management and patient's adherence to plan as established by provider. Inter-disciplinary care team collaboration (see longitudinal plan of care) Discussed plans with patient for ongoing care management follow up and provided patient with direct contact information for care management team Evaluation of depression and anxiety.  LCSW  referral for increased stress-completed Collaborated with LCSW   Self Care Activities:  Patient verbalizes understanding of plan to effectively manage depression and anxiety Self administers medications as prescribed Attends all scheduled provider appointments Calls pharmacy for medication refills Attends church or other social activities Performs ADL's independently Performs IADL's independently Calls provider office for new concerns or questions Work with the [facility] team to meet needs for continued CCM services   Patient Goals: - activity or exercise based on tolerance encouraged - depression screen reviewed - emotional support provided - healthy lifestyle promoted - medication side effects monitored and managed - pain managed - participation in mental health treatment encouraged - quality of sleep assessed - response to mental health treatment monitored - response to pharmacologic therapy  monitored - sleep hygiene techniques encouraged - social activities and relationships encouraged - substance use assessed Follow Up Plan: The care management team will reach out to the patient again over the next 90 business days.

## 2023-04-01 NOTE — Telephone Encounter (Signed)
Okay for verbal order

## 2023-04-02 ENCOUNTER — Other Ambulatory Visit: Payer: Self-pay | Admitting: Obstetrics and Gynecology

## 2023-04-02 DIAGNOSIS — J449 Chronic obstructive pulmonary disease, unspecified: Secondary | ICD-10-CM | POA: Diagnosis not present

## 2023-04-02 NOTE — Patient Outreach (Signed)
Care Coordination  04/02/2023  Alexah Goike [birth year] [phone]  RNCM returned patient's phone call.  Patient called and stated she has an eye appointment 05/17/23 at [facility] in [place].  Kathi Der RN, BSN [place]  [employer], production - Managed Medicaid High Risk [phone].

## 2023-04-02 NOTE — Telephone Encounter (Signed)
Requested Prescriptions  Pending Prescriptions Disp Refills   docusate sodium (COLACE) 100 MG capsule [Pharmacy Med Name: DOCUSATE SODIUM 100 MG CAP] 180 capsule 0    Sig: TAKE 1 CAPSULE BY MOUTH TWICE DAILY     Over the Counter:  OTC Passed - 04/01/2023 12:10 PM      Passed - Valid encounter within last 12 months    Recent Outpatient Visits           3 weeks ago Chronic obstructive pulmonary disease, unspecified COPD type (HCC)   [facility] [name], NP   1 month ago Chronic obstructive pulmonary disease, unspecified COPD type [facility])   [place] [facility] [name], NP   4 months ago Controlled type 2 diabetes mellitus with diabetic polyneuropathy, without long-term current use of insulin (HCC)   [place] [facility] Mecum, Oswaldo Conroy, PA-C   8 months ago Essential hypertension   [facility] [name], NP   11 months ago Morbid obesity [facility])   [facility] [name], NP       Future Appointments             In 1 month [name], NP [place]   In 10 months Willeen Niece, MD [facility]

## 2023-04-04 DIAGNOSIS — N3281 Overactive bladder: Secondary | ICD-10-CM | POA: Diagnosis not present

## 2023-04-04 DIAGNOSIS — R3981 Functional urinary incontinence: Secondary | ICD-10-CM | POA: Diagnosis not present

## 2023-04-04 DIAGNOSIS — Z993 Dependence on wheelchair: Secondary | ICD-10-CM | POA: Diagnosis not present

## 2023-04-07 ENCOUNTER — Ambulatory Visit: Payer: Medicaid Other | Admitting: Pulmonary Disease

## 2023-04-07 DIAGNOSIS — J9611 Chronic respiratory failure with hypoxia: Secondary | ICD-10-CM | POA: Diagnosis not present

## 2023-04-07 DIAGNOSIS — E785 Hyperlipidemia, unspecified: Secondary | ICD-10-CM | POA: Diagnosis not present

## 2023-04-07 DIAGNOSIS — E039 Hypothyroidism, unspecified: Secondary | ICD-10-CM | POA: Diagnosis not present

## 2023-04-07 DIAGNOSIS — E1140 Type 2 diabetes mellitus with diabetic neuropathy, unspecified: Secondary | ICD-10-CM | POA: Diagnosis not present

## 2023-04-07 DIAGNOSIS — Z9981 Dependence on supplemental oxygen: Secondary | ICD-10-CM | POA: Diagnosis not present

## 2023-04-07 DIAGNOSIS — I890 Lymphedema, not elsewhere classified: Secondary | ICD-10-CM | POA: Diagnosis not present

## 2023-04-07 DIAGNOSIS — I10 Essential (primary) hypertension: Secondary | ICD-10-CM | POA: Diagnosis not present

## 2023-04-07 DIAGNOSIS — F419 Anxiety disorder, unspecified: Secondary | ICD-10-CM | POA: Diagnosis not present

## 2023-04-07 DIAGNOSIS — J4489 Other specified chronic obstructive pulmonary disease: Secondary | ICD-10-CM | POA: Diagnosis not present

## 2023-04-07 DIAGNOSIS — Z6841 Body Mass Index (BMI) 40.0 and over, adult: Secondary | ICD-10-CM | POA: Diagnosis not present

## 2023-04-07 DIAGNOSIS — K219 Gastro-esophageal reflux disease without esophagitis: Secondary | ICD-10-CM | POA: Diagnosis not present

## 2023-04-07 DIAGNOSIS — F32A Depression, unspecified: Secondary | ICD-10-CM | POA: Diagnosis not present

## 2023-04-09 DIAGNOSIS — J449 Chronic obstructive pulmonary disease, unspecified: Secondary | ICD-10-CM | POA: Diagnosis not present

## 2023-04-13 DIAGNOSIS — I10 Essential (primary) hypertension: Secondary | ICD-10-CM | POA: Diagnosis not present

## 2023-04-13 DIAGNOSIS — I890 Lymphedema, not elsewhere classified: Secondary | ICD-10-CM | POA: Diagnosis not present

## 2023-04-13 DIAGNOSIS — Z9981 Dependence on supplemental oxygen: Secondary | ICD-10-CM | POA: Diagnosis not present

## 2023-04-13 DIAGNOSIS — F419 Anxiety disorder, unspecified: Secondary | ICD-10-CM | POA: Diagnosis not present

## 2023-04-13 DIAGNOSIS — J9611 Chronic respiratory failure with hypoxia: Secondary | ICD-10-CM | POA: Diagnosis not present

## 2023-04-13 DIAGNOSIS — J4489 Other specified chronic obstructive pulmonary disease: Secondary | ICD-10-CM | POA: Diagnosis not present

## 2023-04-13 DIAGNOSIS — F32A Depression, unspecified: Secondary | ICD-10-CM | POA: Diagnosis not present

## 2023-04-13 DIAGNOSIS — K219 Gastro-esophageal reflux disease without esophagitis: Secondary | ICD-10-CM | POA: Diagnosis not present

## 2023-04-13 DIAGNOSIS — E039 Hypothyroidism, unspecified: Secondary | ICD-10-CM | POA: Diagnosis not present

## 2023-04-13 DIAGNOSIS — Z6841 Body Mass Index (BMI) 40.0 and over, adult: Secondary | ICD-10-CM | POA: Diagnosis not present

## 2023-04-13 DIAGNOSIS — E785 Hyperlipidemia, unspecified: Secondary | ICD-10-CM | POA: Diagnosis not present

## 2023-04-13 DIAGNOSIS — E1140 Type 2 diabetes mellitus with diabetic neuropathy, unspecified: Secondary | ICD-10-CM | POA: Diagnosis not present

## 2023-04-14 DIAGNOSIS — F419 Anxiety disorder, unspecified: Secondary | ICD-10-CM | POA: Diagnosis not present

## 2023-04-14 DIAGNOSIS — J4489 Other specified chronic obstructive pulmonary disease: Secondary | ICD-10-CM | POA: Diagnosis not present

## 2023-04-14 DIAGNOSIS — E039 Hypothyroidism, unspecified: Secondary | ICD-10-CM | POA: Diagnosis not present

## 2023-04-14 DIAGNOSIS — E1140 Type 2 diabetes mellitus with diabetic neuropathy, unspecified: Secondary | ICD-10-CM | POA: Diagnosis not present

## 2023-04-14 DIAGNOSIS — Z9981 Dependence on supplemental oxygen: Secondary | ICD-10-CM | POA: Diagnosis not present

## 2023-04-14 DIAGNOSIS — E785 Hyperlipidemia, unspecified: Secondary | ICD-10-CM | POA: Diagnosis not present

## 2023-04-14 DIAGNOSIS — I890 Lymphedema, not elsewhere classified: Secondary | ICD-10-CM | POA: Diagnosis not present

## 2023-04-14 DIAGNOSIS — Z6841 Body Mass Index (BMI) 40.0 and over, adult: Secondary | ICD-10-CM | POA: Diagnosis not present

## 2023-04-14 DIAGNOSIS — I10 Essential (primary) hypertension: Secondary | ICD-10-CM | POA: Diagnosis not present

## 2023-04-14 DIAGNOSIS — J9611 Chronic respiratory failure with hypoxia: Secondary | ICD-10-CM | POA: Diagnosis not present

## 2023-04-14 DIAGNOSIS — K219 Gastro-esophageal reflux disease without esophagitis: Secondary | ICD-10-CM | POA: Diagnosis not present

## 2023-04-14 DIAGNOSIS — F32A Depression, unspecified: Secondary | ICD-10-CM | POA: Diagnosis not present

## 2023-04-15 ENCOUNTER — Other Ambulatory Visit: Payer: Self-pay

## 2023-04-15 NOTE — Patient Instructions (Signed)
Visit Information  Cheryl Chandler was given information about Medicaid Managed Care team care coordination services as a part of their [facility] Medicaid benefit. Cheryl Chandler verbally consented to engagement with the [facility] Managed Care team.   If you are experiencing a medical emergency, please call 911 or report to your local emergency department or urgent care.   If you have a non-emergency medical problem during routine business hours, please contact your provider's office and ask to speak with a nurse.   For questions related to your [facility] health plan, please call: [phone] or go here:[email or web address]  If you would like to schedule transportation through your [facility] plan, please call the following number at least 2 days in advance of your appointment: [phone].   You can also use the MTM portal or MTM mobile app to manage your rides. Reimbursement for transportation is available through [facility]! For the portal, please go to [email or web address]  Call the [facility] Crisis Line at [phone], at any time, 24 hours a day, 7 days a week. If you are in danger or need immediate medical attention call 911.  If you would like help to quit smoking, call 1-800-QUIT-NOW (940 045 4161) OR Espaol: 1-855-Djelo-Ya (4-132-440-1027) o para ms informacin haga clic aqu or Text READY to 253-664 to register via text  Cheryl Chandler - following are the goals we discussed in your visit today:   Goals Addressed   None       Social Worker will follow up in 30 days.   [name], Kenard Gower, MHA [facility]  Managed Medicaid Social Worker [phone]   Following is a copy of your plan of care:  There are no care plans that you recently modified to display for this patient.

## 2023-04-15 NOTE — Patient Outreach (Signed)
Medicaid Managed Care Social Work Note  04/15/2023 Name:  Cheryl Chandler MRN:  [id number] DOB:  [birth year]  Cheryl Chandler is an 57 y.o. year old female who is a primary patient of [name], NP.  The [facility] Managed Care Coordination team was consulted for assistance with:  [employer]  And Medicare  [name] was given information about Medicaid Managed Care Coordination team services today. [name] Patient agreed to services and verbal consent obtained.  Engaged with patient  for by telephone forfollow up visit in response to referral for case management and/or care coordination services.   Patient is participating in a Managed Medicaid Plan:  Yes  Assessments/Interventions:  Review of past medical history, allergies, medications, health status, including review of consultants reports, laboratory and other test data, was performed as part of comprehensive evaluation and provision of chronic care management services.  SDOH: (Social Drivers of Health) assessments and interventions performed: [name]    Flowsheet Row Patient Outreach Telephone from 04/02/2023 in [place] POPULATION HEALTH DEPARTMENT Most recent reading at 04/02/2023 12:54 PM Patient Outreach Telephone from 04/01/2023 in [place] POPULATION HEALTH DEPARTMENT Most recent reading at 04/01/2023  2:44 PM Patient Outreach Telephone from 03/23/2023 in [place] POPULATION HEALTH DEPARTMENT Most recent reading at 03/23/2023 11:23 AM Patient Outreach Telephone from 03/08/2023 in [place] POPULATION HEALTH DEPARTMENT Most recent reading at 03/08/2023 12:23 PM Office Visit from 03/08/2023 in [facility] Most recent reading at 03/08/2023  8:20 AM Patient Outreach Telephone from 03/02/2023 in [place] POPULATION HEALTH DEPARTMENT Most recent reading at 03/02/2023  2:04 PM  [name]        Utilities Interventions -- Intervention Not  Indicated -- -- -- --  Alcohol Usage Interventions Intervention Not Indicated (Score <7) -- -- Intervention Not Indicated (Score <7) -- --  Depression Interventions/Treatment  -- -- -- -- Medication, Currently on Treatment --  Financial Strain Interventions -- -- -- -- -- Other (Comment)  [patient given resources]  Physical Activity Interventions -- -- -- -- -- Intervention Not Indicated, Other (Comments)  [not physically able to engae in this type of exercise]  Stress Interventions -- -- [employer], Provide Counseling  [Aunt recently passed] -- -- --  Social Connections Interventions Intervention Not Indicated -- -- Intervention Not Indicated -- --  Health Literacy Interventions -- Intervention Not Indicated -- -- -- --      BSW completed a telephone outreach with patient, she states she would like to apply for Medicare. Patient states she would also like assistance with applying for disability. Patient states she would also like some community resources. She states they have gone up on the lot rent. They do receive foodstamps Advanced Directives Status:  Not addressed in this encounter.  Care Plan                 Allergies  Allergen Reactions   Codeine Shortness Of Breath and Rash   Percocet [Oxycodone-Acetaminophen] Shortness Of Breath   Sulfa Antibiotics Shortness Of Breath and Rash   Aspirin Hives   Bee Venom     Bee stings   Darvon [Propoxyphene]     Darvocet-N 100   Latex    Oxycodone Nausea And Vomiting   Penicillins     Has patient had a PCN reaction causing immediate rash, facial/tongue/throat swelling, SOB or lightheadedness with hypotension: Unknown Has patient had a PCN reaction causing severe rash involving mucus membranes or skin necrosis: Unknown  Has patient had a PCN reaction that required hospitalization: Unknown Has patient had a PCN reaction occurring within the last 10 years: Unknown If all of the above answers are "NO", then may proceed  with Cephalosporin use.    Medications Reviewed Today   Medications were not reviewed in this encounter     Patient Active Problem List   Diagnosis Date Noted   Dermatitis 02/02/2023   Cellulitis 02/01/2023   Venous ulcer of ankle, left (HCC) 11/16/2022   Moderate episode of recurrent major depressive disorder (HCC) 08/03/2022   Controlled type 2 diabetes mellitus with diabetic polyneuropathy, without long-term current use of insulin (HCC) 08/03/2022   Venous ulcer (HCC) 05/03/2022   Foot pain, right 09/01/2021   Fibrocystic disease of both breasts 05/07/2019   Chronic venous insufficiency 11/09/2018   Overactive bladder 09/30/2018   Peripheral neuropathy 09/30/2018   Apnea 01/02/2016   Lymphedema 11/30/2014   Dyspnea on exertion 11/30/2014   Anxiety 11/29/2014   Arthritis 11/29/2014   COPD (chronic obstructive pulmonary disease) (HCC) 11/29/2014   Clinical depression 11/29/2014   H/O eating disorder 11/29/2014   Esophageal reflux 11/29/2014   Acne inversa 11/29/2014   Essential hypertension 11/29/2014   HLD (hyperlipidemia) 11/29/2014   Adult hypothyroidism 11/29/2014   Insomnia 11/29/2014   Low back pain 11/29/2014   Morbid obesity (HCC) 11/29/2014   Posttraumatic stress disorder 11/29/2014   Vitamin D deficiency 11/29/2014   Nicotine dependence, cigarettes, uncomplicated 11/29/2014    Conditions to be addressed/monitored per PCP order:   community resources  There are no care plans that you recently modified to display for this patient.   Follow up:  Patient agrees to Care Plan and Follow-up.  Plan: The Managed Medicaid care management team will reach out to the patient again over the next 30 days.  Date/time of next scheduled Social Work care management/care coordination outreach:  05/17/2023  [name], Kenard Gower, [facility]  Managed [facility] Social Worker [phone]

## 2023-04-22 ENCOUNTER — Telehealth: Payer: Self-pay | Admitting: Nurse Practitioner

## 2023-04-22 NOTE — Telephone Encounter (Signed)
Copied from CRM [id number]. Topic: General - Other >> Apr 20, 2023  9:10 AM [place] B wrote: Reason for CRM: [name] from [place], states pt was sick and missed OT appt today. She will do it next week

## 2023-04-22 NOTE — Telephone Encounter (Signed)
FYI

## 2023-04-27 ENCOUNTER — Ambulatory Visit (INDEPENDENT_AMBULATORY_CARE_PROVIDER_SITE_OTHER): Payer: Medicaid Other | Admitting: Nurse Practitioner

## 2023-04-28 DIAGNOSIS — Z419 Encounter for procedure for purposes other than remedying health state, unspecified: Secondary | ICD-10-CM | POA: Diagnosis not present

## 2023-04-29 DIAGNOSIS — I890 Lymphedema, not elsewhere classified: Secondary | ICD-10-CM | POA: Diagnosis not present

## 2023-04-30 ENCOUNTER — Other Ambulatory Visit: Payer: Self-pay | Admitting: Nurse Practitioner

## 2023-05-01 ENCOUNTER — Inpatient Hospital Stay
Admission: EM | Admit: 2023-05-01 | Discharge: 2023-05-03 | DRG: 372 | Disposition: A | Discharge status: Home / Self Care | Payer: Medicaid Other | Attending: Obstetrics and Gynecology | Admitting: Obstetrics and Gynecology

## 2023-05-01 ENCOUNTER — Emergency Department: Payer: Medicaid Other

## 2023-05-01 ENCOUNTER — Other Ambulatory Visit: Payer: Self-pay

## 2023-05-01 ENCOUNTER — Encounter: Payer: Self-pay | Admitting: Emergency Medicine

## 2023-05-01 DIAGNOSIS — F331 Major depressive disorder, recurrent, moderate: Secondary | ICD-10-CM | POA: Diagnosis not present

## 2023-05-01 DIAGNOSIS — A009 Cholera, unspecified: Principal | ICD-10-CM | POA: Diagnosis present

## 2023-05-01 DIAGNOSIS — E039 Hypothyroidism, unspecified: Secondary | ICD-10-CM | POA: Diagnosis present

## 2023-05-01 DIAGNOSIS — Z83438 Family history of other disorder of lipoprotein metabolism and other lipidemia: Secondary | ICD-10-CM

## 2023-05-01 DIAGNOSIS — Z8249 Family history of ischemic heart disease and other diseases of the circulatory system: Secondary | ICD-10-CM

## 2023-05-01 DIAGNOSIS — A000 Cholera due to Vibrio cholerae 01, biovar cholerae: Secondary | ICD-10-CM | POA: Diagnosis not present

## 2023-05-01 DIAGNOSIS — Z79890 Hormone replacement therapy (postmenopausal): Secondary | ICD-10-CM

## 2023-05-01 DIAGNOSIS — A0811 Acute gastroenteropathy due to Norwalk agent: Principal | ICD-10-CM | POA: Diagnosis present

## 2023-05-01 DIAGNOSIS — Z993 Dependence on wheelchair: Secondary | ICD-10-CM

## 2023-05-01 DIAGNOSIS — Z794 Long term (current) use of insulin: Secondary | ICD-10-CM

## 2023-05-01 DIAGNOSIS — G8929 Other chronic pain: Secondary | ICD-10-CM | POA: Diagnosis present

## 2023-05-01 DIAGNOSIS — E119 Type 2 diabetes mellitus without complications: Secondary | ICD-10-CM

## 2023-05-01 DIAGNOSIS — F17210 Nicotine dependence, cigarettes, uncomplicated: Secondary | ICD-10-CM | POA: Diagnosis present

## 2023-05-01 DIAGNOSIS — I959 Hypotension, unspecified: Secondary | ICD-10-CM | POA: Diagnosis not present

## 2023-05-01 DIAGNOSIS — N83209 Unspecified ovarian cyst, unspecified side: Secondary | ICD-10-CM

## 2023-05-01 DIAGNOSIS — N83201 Unspecified ovarian cyst, right side: Secondary | ICD-10-CM | POA: Diagnosis not present

## 2023-05-01 DIAGNOSIS — R112 Nausea with vomiting, unspecified: Secondary | ICD-10-CM | POA: Diagnosis not present

## 2023-05-01 DIAGNOSIS — K429 Umbilical hernia without obstruction or gangrene: Secondary | ICD-10-CM | POA: Diagnosis not present

## 2023-05-01 DIAGNOSIS — E1142 Type 2 diabetes mellitus with diabetic polyneuropathy: Secondary | ICD-10-CM | POA: Diagnosis not present

## 2023-05-01 DIAGNOSIS — K529 Noninfective gastroenteritis and colitis, unspecified: Secondary | ICD-10-CM | POA: Diagnosis present

## 2023-05-01 DIAGNOSIS — N83292 Other ovarian cyst, left side: Secondary | ICD-10-CM | POA: Diagnosis not present

## 2023-05-01 DIAGNOSIS — Z87442 Personal history of urinary calculi: Secondary | ICD-10-CM

## 2023-05-01 DIAGNOSIS — Z79899 Other long term (current) drug therapy: Secondary | ICD-10-CM

## 2023-05-01 DIAGNOSIS — R1111 Vomiting without nausea: Secondary | ICD-10-CM | POA: Diagnosis not present

## 2023-05-01 DIAGNOSIS — J9611 Chronic respiratory failure with hypoxia: Secondary | ICD-10-CM | POA: Diagnosis present

## 2023-05-01 DIAGNOSIS — Z9049 Acquired absence of other specified parts of digestive tract: Secondary | ICD-10-CM

## 2023-05-01 DIAGNOSIS — R9389 Abnormal findings on diagnostic imaging of other specified body structures: Secondary | ICD-10-CM | POA: Diagnosis not present

## 2023-05-01 DIAGNOSIS — I10 Essential (primary) hypertension: Secondary | ICD-10-CM | POA: Diagnosis not present

## 2023-05-01 DIAGNOSIS — Z743 Need for continuous supervision: Secondary | ICD-10-CM | POA: Diagnosis not present

## 2023-05-01 DIAGNOSIS — Z880 Allergy status to penicillin: Secondary | ICD-10-CM | POA: Diagnosis not present

## 2023-05-01 DIAGNOSIS — J4489 Other specified chronic obstructive pulmonary disease: Secondary | ICD-10-CM | POA: Diagnosis present

## 2023-05-01 DIAGNOSIS — G629 Polyneuropathy, unspecified: Secondary | ICD-10-CM

## 2023-05-01 DIAGNOSIS — Z96641 Presence of right artificial hip joint: Secondary | ICD-10-CM | POA: Diagnosis present

## 2023-05-01 DIAGNOSIS — Z91040 Latex allergy status: Secondary | ICD-10-CM

## 2023-05-01 DIAGNOSIS — N9489 Other specified conditions associated with female genital organs and menstrual cycle: Secondary | ICD-10-CM | POA: Diagnosis present

## 2023-05-01 DIAGNOSIS — Z9981 Dependence on supplemental oxygen: Secondary | ICD-10-CM | POA: Diagnosis not present

## 2023-05-01 DIAGNOSIS — I890 Lymphedema, not elsewhere classified: Secondary | ICD-10-CM | POA: Diagnosis present

## 2023-05-01 DIAGNOSIS — Z791 Long term (current) use of non-steroidal anti-inflammatories (NSAID): Secondary | ICD-10-CM

## 2023-05-01 DIAGNOSIS — R197 Diarrhea, unspecified: Secondary | ICD-10-CM | POA: Diagnosis not present

## 2023-05-01 DIAGNOSIS — N83202 Unspecified ovarian cyst, left side: Secondary | ICD-10-CM | POA: Diagnosis present

## 2023-05-01 DIAGNOSIS — R109 Unspecified abdominal pain: Secondary | ICD-10-CM | POA: Diagnosis not present

## 2023-05-01 DIAGNOSIS — R110 Nausea: Secondary | ICD-10-CM | POA: Diagnosis not present

## 2023-05-01 DIAGNOSIS — Z882 Allergy status to sulfonamides status: Secondary | ICD-10-CM

## 2023-05-01 DIAGNOSIS — E785 Hyperlipidemia, unspecified: Secondary | ICD-10-CM | POA: Diagnosis present

## 2023-05-01 DIAGNOSIS — Z91030 Bee allergy status: Secondary | ICD-10-CM

## 2023-05-01 DIAGNOSIS — Z885 Allergy status to narcotic agent status: Secondary | ICD-10-CM

## 2023-05-01 DIAGNOSIS — J449 Chronic obstructive pulmonary disease, unspecified: Secondary | ICD-10-CM | POA: Diagnosis present

## 2023-05-01 LAB — CBC
HCT: 42.7 % (ref 36.0–46.0)
Hemoglobin: 13.5 g/dL (ref 12.0–15.0)
MCH: 28.2 pg (ref 26.0–34.0)
MCHC: 31.6 g/dL (ref 30.0–36.0)
MCV: 89.1 fL (ref 80.0–100.0)
Platelets: 413 10*3/uL — ABNORMAL HIGH (ref 150–400)
RBC: 4.79 MIL/uL (ref 3.87–5.11)
RDW: 15.9 % — ABNORMAL HIGH (ref 11.5–15.5)
WBC: 16.5 10*3/uL — ABNORMAL HIGH (ref 4.0–10.5)
nRBC: 0 % (ref 0.0–0.2)

## 2023-05-01 LAB — COMPREHENSIVE METABOLIC PANEL
ALT: 27 U/L (ref 0–44)
AST: 26 U/L (ref 15–41)
Albumin: 3.6 g/dL (ref 3.5–5.0)
Alkaline Phosphatase: 101 U/L (ref 38–126)
Anion gap: 17 — ABNORMAL HIGH (ref 5–15)
BUN: 20 mg/dL (ref 6–20)
CO2: 19 mmol/L — ABNORMAL LOW (ref 22–32)
Calcium: 9.3 mg/dL (ref 8.9–10.3)
Chloride: 104 mmol/L (ref 98–111)
Creatinine, Ser: 0.88 mg/dL (ref 0.44–1.00)
GFR, Estimated: >60 mL/min (ref >60)
Glucose, Bld: 144 mg/dL — ABNORMAL HIGH (ref 70–99)
Potassium: 3.8 mmol/L (ref 3.5–5.1)
Sodium: 140 mmol/L (ref 135–145)
Total Bilirubin: 0.5 mg/dL (ref 0.0–1.2)
Total Protein: 8.4 g/dL — ABNORMAL HIGH (ref 6.5–8.1)

## 2023-05-01 LAB — BETA-HYDROXYBUTYRIC ACID: Beta-Hydroxybutyric Acid: 0.31 mmol/L — ABNORMAL HIGH (ref 0.05–0.27)

## 2023-05-01 LAB — GASTROINTESTINAL PANEL BY PCR, STOOL (REPLACES STOOL CULTURE)
Adenovirus F40/41: NOT DETECTED
Astrovirus: NOT DETECTED
Campylobacter species: NOT DETECTED
Cryptosporidium: NOT DETECTED
Cyclospora cayetanensis: NOT DETECTED
Entamoeba histolytica: NOT DETECTED
Enteroaggregative E coli (EAEC): NOT DETECTED
Enteropathogenic E coli (EPEC): NOT DETECTED
Enterotoxigenic E coli (ETEC): NOT DETECTED
Giardia lamblia: NOT DETECTED
Norovirus GI/GII: DETECTED — AB
Plesimonas shigelloides: NOT DETECTED
Rotavirus A: NOT DETECTED
Salmonella species: NOT DETECTED
Sapovirus (I, II, IV, and V): NOT DETECTED
Shiga like toxin producing E coli (STEC): NOT DETECTED
Shigella/Enteroinvasive E coli (EIEC): NOT DETECTED
Vibrio cholerae: DETECTED — AB
Vibrio species: DETECTED — AB
Yersinia enterocolitica: NOT DETECTED

## 2023-05-01 LAB — HCG, QUANTITATIVE, PREGNANCY: hCG, Beta Chain, Quant, S: <1 m[IU]/mL (ref <5)

## 2023-05-01 LAB — CBG MONITORING, ED: Glucose-Capillary: 121 mg/dL — ABNORMAL HIGH (ref 70–99)

## 2023-05-01 LAB — MAGNESIUM: Magnesium: 2.4 mg/dL (ref 1.7–2.4)

## 2023-05-01 LAB — LIPASE, BLOOD: Lipase: 23 U/L (ref 11–51)

## 2023-05-01 MED ORDER — ONDANSETRON HCL 4 MG/2ML IJ SOLN
4.0000 mg | Freq: Once | INTRAMUSCULAR | Status: AC
  Administered 2023-05-01: 4 mg via INTRAVENOUS
  Filled 2023-05-01: qty 2

## 2023-05-01 MED ORDER — METOCLOPRAMIDE HCL 5 MG/ML IJ SOLN
INTRAMUSCULAR | Status: AC
  Administered 2023-05-01: 10 mg via INTRAVENOUS
  Filled 2023-05-01: qty 2

## 2023-05-01 MED ORDER — AZITHROMYCIN 1 G PO PACK
1.0000 g | PACK | Freq: Once | ORAL | Status: AC
  Administered 2023-05-01: 1 g via ORAL
  Filled 2023-05-01: qty 1

## 2023-05-01 MED ORDER — DULAGLUTIDE 0.75 MG/0.5ML ~~LOC~~ SOAJ: 0.7500 mg | SUBCUTANEOUS | Status: DC

## 2023-05-01 MED ORDER — ONDANSETRON HCL 4 MG/2ML IJ SOLN
4.0000 mg | Freq: Four times a day (QID) | INTRAMUSCULAR | Status: DC | PRN
  Administered 2023-05-02: 4 mg via INTRAVENOUS
  Filled 2023-05-01: qty 2

## 2023-05-01 MED ORDER — DOXYCYCLINE HYCLATE 100 MG PO TABS
300.0000 mg | ORAL_TABLET | Freq: Once | ORAL | Status: AC
Start: 2023-05-01 — End: 2023-05-01
  Administered 2023-05-01: 300 mg via ORAL
  Filled 2023-05-01: qty 3

## 2023-05-01 MED ORDER — FENTANYL CITRATE PF 50 MCG/ML IJ SOSY
50.0000 ug | PREFILLED_SYRINGE | Freq: Once | INTRAMUSCULAR | Status: AC
  Administered 2023-05-01: 50 ug via INTRAVENOUS
  Filled 2023-05-01: qty 1

## 2023-05-01 MED ORDER — ONDANSETRON HCL 4 MG PO TABS: 4.0000 mg | ORAL_TABLET | Freq: Three times a day (TID) | ORAL | 0 refills | Status: DC | PRN

## 2023-05-01 MED ORDER — INSULIN ASPART 100 UNIT/ML IJ SOLN
0.0000 [IU] | Freq: Every day | INTRAMUSCULAR | Status: DC
Start: 2023-05-01 — End: 2023-05-03

## 2023-05-01 MED ORDER — LEVOTHYROXINE SODIUM 50 MCG PO TABS
50.0000 ug | ORAL_TABLET | Freq: Every day | ORAL | Status: DC
Start: 2023-05-02 — End: 2023-05-03
  Administered 2023-05-02 – 2023-05-03 (×2): 50 ug via ORAL
  Filled 2023-05-01 (×2): qty 1

## 2023-05-01 MED ORDER — GADOBUTROL 1 MMOL/ML IV SOLN
10.0000 mL | Freq: Once | INTRAVENOUS | Status: DC | PRN
Start: 2023-05-01 — End: 2023-05-03

## 2023-05-01 MED ORDER — HEPARIN SODIUM (PORCINE) 5000 UNIT/ML IJ SOLN
5000.0000 [IU] | Freq: Three times a day (TID) | INTRAMUSCULAR | Status: DC
  Administered 2023-05-01 – 2023-05-03 (×5): 5000 [IU] via SUBCUTANEOUS
  Filled 2023-05-01 (×5): qty 1

## 2023-05-01 MED ORDER — ZINC OXIDE 40 % EX OINT
TOPICAL_OINTMENT | Freq: Two times a day (BID) | CUTANEOUS | Status: DC
  Filled 2023-05-01: qty 113

## 2023-05-01 MED ORDER — FLUTICASONE FUROATE-VILANTEROL 100-25 MCG/ACT IN AEPB
1.0000 | INHALATION_SPRAY | Freq: Every day | RESPIRATORY_TRACT | Status: DC
  Administered 2023-05-03: 1 via RESPIRATORY_TRACT
  Filled 2023-05-01: qty 28

## 2023-05-01 MED ORDER — ALBUTEROL SULFATE (2.5 MG/3ML) 0.083% IN NEBU: 2.5000 mg | INHALATION_SOLUTION | RESPIRATORY_TRACT | Status: DC | PRN

## 2023-05-01 MED ORDER — SODIUM CHLORIDE 0.9 % IV BOLUS (SEPSIS)
1000.0000 mL | Freq: Once | INTRAVENOUS | Status: AC
  Administered 2023-05-01: 1000 mL via INTRAVENOUS

## 2023-05-01 MED ORDER — HYDROMORPHONE HCL 1 MG/ML IJ SOLN
0.5000 mg | INTRAMUSCULAR | Status: DC | PRN
  Administered 2023-05-02 – 2023-05-03 (×4): 0.5 mg via INTRAVENOUS
  Filled 2023-05-01 (×6): qty 0.5

## 2023-05-01 MED ORDER — UMECLIDINIUM BROMIDE 62.5 MCG/ACT IN AEPB
1.0000 | INHALATION_SPRAY | Freq: Every day | RESPIRATORY_TRACT | Status: DC
  Administered 2023-05-03: 1 via RESPIRATORY_TRACT
  Filled 2023-05-01: qty 7

## 2023-05-01 MED ORDER — SODIUM CHLORIDE 0.9 % IV SOLN: Freq: Once | INTRAVENOUS | Status: AC

## 2023-05-01 MED ORDER — INSULIN ASPART 100 UNIT/ML IJ SOLN: 0.0000 [IU] | Freq: Three times a day (TID) | INTRAMUSCULAR | Status: DC

## 2023-05-01 MED ORDER — METOCLOPRAMIDE HCL 5 MG/ML IJ SOLN
10.0000 mg | Freq: Once | INTRAMUSCULAR | Status: AC
  Filled 2023-05-01: qty 2

## 2023-05-01 MED ORDER — CITALOPRAM HYDROBROMIDE 20 MG PO TABS
40.0000 mg | ORAL_TABLET | Freq: Every day | ORAL | Status: DC
  Administered 2023-05-02 – 2023-05-03 (×2): 40 mg via ORAL
  Filled 2023-05-01 (×2): qty 2

## 2023-05-01 MED ORDER — IOHEXOL 300 MG/ML  SOLN
100.0000 mL | Freq: Once | INTRAMUSCULAR | Status: AC | PRN
  Administered 2023-05-01: 100 mL via INTRAVENOUS

## 2023-05-01 NOTE — ED Notes (Signed)
 Patient given second sprite per request.

## 2023-05-01 NOTE — ED Notes (Signed)
 Patient to [place]

## 2023-05-01 NOTE — ED Notes (Signed)
 IV team at bedside

## 2023-05-01 NOTE — ED Provider Notes (Signed)
 GI panel positive for cholera and norovirus. Discussed with Gwen [place] with ID. Recommended 1 gram azithromycin. Will plan on admission for hydration.    Phineas Semen, MD 05/01/23 2020

## 2023-05-01 NOTE — Consult Note (Signed)
 Consult History and Physical   SERVICE: Gynecology   Patient Name: Cheryl Chandler Patient MRN:   [id number]  CC: abdominal pain  HPI: Cheryl Chandler is a 58 y.o. G2P0 with abdominal pain and n/v/d that started this morning. She presented to ED for evaluation of GI symptoms. CT abdomen/pelvis incidentally showed bilateral ovarian cysts. States she's had ovarian cysts before that required surgery to remove (unsure how long ago this was). She denies abdominal pain prior to N/V/D this morning. Denies significant GI symptoms like bloating or indigestion. Menses have been absent since mid-40's, unsure exactly when menopause started. She is also unsure when her last pap smear was, states it was a long time ago.  Denies vaginal bleeding or discharge. Family history is significant for ovarian cancer in maternal aunt.    Review of Systems  Constitutional:  Positive for malaise/fatigue. Negative for chills and fever.  Respiratory:  Positive for shortness of breath (chronic). Negative for cough.   Cardiovascular:  Negative for chest pain.  Gastrointestinal:  Positive for abdominal pain, diarrhea, nausea and vomiting.  Genitourinary:  Negative for dysuria and urgency.  Neurological:  Negative for dizziness and headaches.  Psychiatric/Behavioral:  Negative for depression. The patient is not nervous/anxious.      Past Obstetrical History: OB History     Gravida  2   Para  0   Term      Preterm      AB      Living         SAB      IAB      Ectopic      Multiple      Live Births              Past Gynecologic History: Patient's last menstrual period was 06/09/2017 (approximate).  Menarche - 12/13 Menopause age - unsure, states likely mid-40's   Past Medical History: Past Medical History:  Diagnosis Date   Arthritis    Asthma    COPD (chronic obstructive pulmonary disease) (HCC)    Hypertension    Neuropathy    Pericarditis    Personal history of  urinary calculi 11/29/2014   Thyroid  disease     Past Surgical History:   Past Surgical History:  Procedure Laterality Date   APPENDECTOMY     arm surgery Left    pins   fatty tumor removed from back     JOINT REPLACEMENT Right    LEG SURGERY Bilateral    OVARIAN CYST SURGERY Right    took piece of right ovary due to cyst   TOTAL HIP ARTHROPLASTY Right    wisdom teeth removed      Family History:  family history includes Heart disease in her mother; Hyperlipidemia in her mother; Hypertension in her mother; Seizures in her sister.  Social History:  Social History   Socioeconomic History   Marital status: Married    Spouse name: Not on file   Number of children: Not on file   Years of education: Not on file   Highest education level: Not on file  Occupational History   Not on file  Tobacco Use   Smoking status: Every Day    Current packs/day: 0.00    Average packs/day: 3.0 packs/day for 30.0 years (90.0 ttl pk-yrs)    Types: Cigarettes    Start date: 01/1992    Last attempt to quit: 01/2022    Years since quitting: 1.2   Smokeless tobacco: Never  Tobacco comments:    Smokes 1 PPD; 12/18/2022.  Vaping Use   Vaping status: Never Used  Substance and Sexual Activity   Alcohol use: No    Alcohol/week: 0.0 standard drinks of alcohol   Drug use: No   Sexual activity: Not on file  Other Topics Concern   Not on file  Social History Narrative   Not on file   Social Drivers of Health   Financial Resource Strain: High Risk (03/02/2023)   Overall Financial Resource Strain (CARDIA)    Difficulty of Paying Living Expenses: Very hard  Food Insecurity: Food Insecurity Present (02/02/2023)   Hunger Vital Sign    Worried About Running Out of Food in the Last Year: Sometimes true    Ran Out of Food in the Last Year: Sometimes true  Transportation Needs: No Transportation Needs (02/02/2023)   PRAPARE - Administrator, Civil Service (Medical): No    Lack of  Transportation (Non-Medical): No  Physical Activity: Inactive (03/02/2023)   Exercise Vital Sign    Days of Exercise per Week: 0 days    Minutes of Exercise per Session: 0 min  Stress: Stress Concern Present (03/23/2023)   [employer] of Occupational Health - Occupational Stress Questionnaire    Feeling of Stress : Rather much  Social Connections: Moderately Integrated (04/02/2023)   Social Connection and Isolation Panel [NHANES]    Frequency of Communication with Friends and Family: More than three times a week    Frequency of Social Gatherings with Friends and Family: More than three times a week    Attends Religious Services: 1 to 4 times per year    Active Member of [employer] or Organizations: No    Attends Banker Meetings: Never    Marital Status: Married  Catering Manager Violence: Not At Risk (03/23/2023)   Humiliation, Afraid, Rape, and Kick questionnaire    Fear of Current or Ex-Partner: No    Emotionally Abused: No    Physically Abused: No    Sexually Abused: No    Home Medications:  Medications reconciled in EPIC  No current facility-administered medications on file prior to encounter.   Current Outpatient Medications on File Prior to Encounter  Medication Sig Dispense Refill   ACCU-CHEK GUIDE test strip USE TO CHECK BLOOD SUGAR 3 TIMES DAILY AS DIRECTED 300 each 2   Accu-Chek Softclix Lancets lancets CHECK BLOOD SUGAR TWICE DAILY 100 each 1   albuterol  (PROVENTIL ) (2.5 MG/3ML) 0.083% nebulizer solution USE 1 VIAL VIA NEBULIZER EVERY 4 HOURS AS NEEDED 225 mL 0   amLODipine  (NORVASC ) 5 MG tablet Take 5 mg by mouth daily.     atorvastatin  (LIPITOR) 80 MG tablet Take 1 tablet (80 mg total) by mouth daily. 90 tablet 3   Blood Glucose Monitoring Suppl (ACCU-CHEK GUIDE) w/Device KIT Use to check blood sugar 3 times a day. 1 kit 1   Blood Pressure Monitoring (OMRON 3 SERIES BP MONITOR) DEVI Use to check blood pressure as directed 1 each 0   citalopram  (CELEXA )  40 MG tablet Take 1 tablet (40 mg total) by mouth daily. 90 tablet 0   diphenhydrAMINE (BENADRYL) 25 mg capsule Take 25 mg by mouth every 6 (six) hours as needed for itching. Taking once a day     docusate sodium  (COLACE) 100 MG capsule TAKE 1 CAPSULE BY MOUTH TWICE DAILY 180 capsule 0   Dulaglutide  (TRULICITY ) 0.75 MG/0.5ML SOPN 0.75 mg by ultrasound guided injection route once a week. 6 mL  1   EPINEPHrine  0.3 mg/0.3 mL IJ SOAJ injection INJECT 1 SYRINGE INTO OUTER THIGH ONCE AS NEEDED FOR SEVERE ALLERGIC REACTION. 2 each 3   Fluticasone -Umeclidin-Vilant (TRELEGY ELLIPTA ) 100-62.5-25 MCG/ACT AEPB Inhale 1 puff into the lungs daily. 28 each 11   furosemide  (LASIX ) 20 MG tablet Take 1 tablet (20 mg total) by mouth 2 (two) times daily. 180 tablet 3   gabapentin  (NEURONTIN ) 300 MG capsule TAKE 1 CAPSULE BY MOUTH 3 TIMES DAILY ASNEEDED (NEED OFFICE VISIT FOR FURTHER REFILLS) (Patient taking differently: Take 300 mg by mouth 3 (three) times daily as needed.) 270 capsule 0   hydrOXYzine  (VISTARIL ) 25 MG capsule Take 1 capsule (25 mg total) by mouth every 8 (eight) hours as needed. 270 capsule 1   levothyroxine  (SYNTHROID ) 50 MCG tablet TAKE 1 TABLET BY MOUTH ONCE DAILY ON AN EMPTY STOMACH. WAIT 30 MINUTES BEFORE TAKING OTHER MEDS. 90 tablet 0   lisinopril  (ZESTRIL ) 40 MG tablet TAKE 1 TABLET BY MOUTH ONCE DAILY 90 tablet 0   meloxicam  (MOBIC ) 15 MG tablet TAKE 1 TABLET BY MOUTH ONCE DAILY AS NEEDED WITH FOOD OR MILK 90 tablet 3   montelukast  (SINGULAIR ) 10 MG tablet TAKE 1 TABLET BY MOUTH AT BEDTIME 90 tablet 3   nitroGLYCERIN  (NITROSTAT ) 0.4 MG SL tablet DISSOLVE 1 TABLET UNDER TONGUE AT ONSET OF CHEST PAIN. REPEAT IN 5 MIN IF NOT RESOLVED, MAX 3 DOSES. 911 IF NEEDED. 25 tablet 1   omeprazole  (PRILOSEC) 20 MG capsule TAKE 1 CAPSULE BY MOUTH ONCE DAILY 30 capsule 12   OXYGEN  Inhale 3 L into the lungs daily.     Silver (AQUACEL AG FOAM) 8X8 PADS Apply to areas of ulceration on legs as directed 10 each 1    spironolactone  (ALDACTONE ) 25 MG tablet Take 0.5 tablets (12.5 mg total) by mouth daily. 45 tablet 1   Vitamin D , Ergocalciferol , (DRISDOL ) 1.25 MG (50000 UNIT) CAPS capsule TAKE 1 CAPSULE BY MOUTH EVERY 7 DAYS 12 capsule 1   Wound Cleansers (VASHE WOUND) 0.033 % SOLN Apply 1 Application topically daily. Use as directed to cleanse lower legs skin 1000 mL 1    Allergies:  Allergies  Allergen Reactions   Codeine Shortness Of Breath and Rash   Percocet [Oxycodone-Acetaminophen ] Shortness Of Breath   Sulfa Antibiotics Shortness Of Breath and Rash   Aspirin Hives   Bee Venom     Bee stings   Darvon [Propoxyphene]     Darvocet-N 100   Latex    Oxycodone Nausea And Vomiting   Penicillins     Has patient had a PCN reaction causing immediate rash, facial/tongue/throat swelling, SOB or lightheadedness with hypotension: Unknown Has patient had a PCN reaction causing severe rash involving mucus membranes or skin necrosis: Unknown Has patient had a PCN reaction that required hospitalization: Unknown Has patient had a PCN reaction occurring within the last 10 years: Unknown If all of the above answers are NO, then may proceed with Cephalosporin use.    Physical Exam:  Temp:  [98.1 F (36.7 C)-98.8 F (37.1 C)] 98.8 F (37.1 C) (01/04 0900) Pulse Rate:  [84-93] 93 (01/04 1330) Resp:  [20-42] 20 (01/04 1330) BP: (124-182)/(46-67) 179/56 (01/04 1330) SpO2:  [100 %] 100 % (01/04 1330) Weight:  [142.9 kg] 142.9 kg (01/04 0503)  Physical Exam Constitutional:      Appearance: She is well-developed. She is ill-appearing.  Pulmonary:     Effort: Pulmonary effort is normal.     Comments: O2 via [place] Abdominal:  Tenderness: There is no abdominal tenderness.     Comments: Exam limited d/t body habitus, mild tenderness in abdominal area, no specific masses palpated.   Genitourinary:    Comments: Deferred  Skin:    General: Skin is warm and dry.  Neurological:     Mental Status: She is  alert and oriented to person, place, and time.  Psychiatric:        Mood and Affect: Mood normal.      Labs/Studies:   CBC and Coags:  Lab Results  Component Value Date   WBC 16.5 (H) 05/01/2023   NEUTOPHILPCT 80 05/12/2022   EOSPCT 2 05/12/2022   BASOPCT 1 05/12/2022   LYMPHOPCT 9 05/12/2022   HGB 13.5 05/01/2023   HCT 42.7 05/01/2023   MCV 89.1 05/01/2023   PLT 413 (H) 05/01/2023   CMP:  Lab Results  Component Value Date   NA 140 05/01/2023   K 3.8 05/01/2023   CL 104 05/01/2023   CO2 19 (L) 05/01/2023   BUN 20 05/01/2023   CREATININE 0.88 05/01/2023   CREATININE 0.65 02/08/2023   CREATININE 0.79 02/02/2023   GLU 94 06/05/2014   PROT 8.4 (H) 05/01/2023   BILITOT 0.5 05/01/2023   ALT 27 05/01/2023   AST 26 05/01/2023   ALKPHOS 101 05/01/2023   Other Labs: Ca125 pending   TVUS:  Pending  Other Imaging: CT ABDOMEN PELVIS W CONTRAST Result Date: 05/01/2023 CLINICAL DATA:  Abdominal pain.  Nausea vomiting and diarrhea EXAM: CT ABDOMEN AND PELVIS WITH CONTRAST TECHNIQUE: Multidetector CT imaging of the abdomen and pelvis was performed using the standard protocol following bolus administration of intravenous contrast. RADIATION DOSE REDUCTION: This exam was performed according to the departmental dose-optimization program which includes automated exposure control, adjustment of the mA and/or kV according to patient size and/or use of iterative reconstruction technique. CONTRAST:  OMNIPAQUE  IOHEXOL  300 MG/ML  SOLN COMPARISON:  08/05/2014 FINDINGS: Lower chest: No acute abnormality. Hepatobiliary: No focal liver abnormality is seen. No gallstones, gallbladder wall thickening, or biliary dilatation. Pancreas: Unremarkable. No pancreatic ductal dilatation or surrounding inflammatory changes. Spleen: Normal in size without focal abnormality. Adrenals/Urinary Tract: Right adrenal gland is normal. 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 Increased from 1.8 by 1.8 cm previously. No nephrolithiasis, hydronephrosis or suspicious mass. Urinary bladder appears unremarkable. Stomach/Bowel: Stomach appears normal and nondistended. No pathologic dilatation of the large or small bowel loops. Liquid stool identified throughout the colon. No bowel wall thickening or inflammatory change identified. The appendix is surgically absent. Vascular/Lymphatic: Patent upper abdominal vascularity. Aortic atherosclerotic calcifications. No aneurysm. Prominent bilateral iliac lymph nodes are identified. The left common iliac node measures 1 cm, image 57/2. There is a left external iliac node which measures 1.3 cm, image 73/2. Left inguinal lymph node measures 1.5 cm, image 93/2. Reproductive: The uterus appears normal. There is a simple appearing cyst with in the left ovary measuring 5.7 x 3.6 cm, image 67/2. Previously 3.7 by 2.3 cm. Ovary cystic structure within the lower abdomen containing thin internal area of septation is felt to arise off the right adnexa measuring 10.7 by 6.6 cm, image 46/5. On the previous exam there was a right ovary cyst measuring 5.3 x 3.1 cm. Other: No ascites identified. No signs of peritoneal nodularity. Laxity of the ventral abdominal wall is identified. Small fat containing umbilical hernia. Musculoskeletal: No acute or suspicious osseous findings. Signs of previous instrumentation within bilateral femurs. IMPRESSION:  1. Liquid stool identified throughout the colon compatible with diarrheal illness. No bowel wall thickening or inflammatory change identified. 2. Bilocular cyst is felt to arise from the right ovary and is displaced into the central portion of the lower abdomen with a maximum dimension of 10.7 cm. This is concerning for low-grade cystic neoplasm. Recommend GYN consult and consider pelvis MRI w/o and w/ contrast if clinically warranted. Note: This recommendation does not apply to premenarchal patients and to those with  increased risk (genetic, family history, elevated tumor markers or other high-risk factors) of ovarian cancer. Reference: JACR 2020 Feb; 17(2):248-254 3. Left ovary cyst measures 5.7 cm and is increased from 3.7 cm previously. 4. Prominent bilateral iliac and left inguinal lymph nodes are identified. These are nonspecific. 5. 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 Increased from 1.8 by 1.8 cm previously. 6.  Aortic Atherosclerosis (ICD10-I70.0). Electronically Signed   By: Waddell Calk M.D.   On: 05/01/2023 12:14     Assessment / Plan:   Cheryl Chandler is a 58 y.o. G2P0 who presents with abdominal pain.   Bilateral ovarian cysts: -Incidental finding of bilateral ovarian cysts  -Current symptoms are consisted with GI viral syndrome  -Reviewed assessment with Dr. Lovetta -Recommend TVUS for further assessment of bilateral ovarian cysts  -Ca125 collected  -Discussed with Cheryl Chandler and partner further evaluation with Dr. Lovetta outpatient and close follow up    Thank you for the opportunity to be involved with this patient's care.  ----- Therisa Pillow, CNM Midwife [facility], Department of OB/GYN [facility]

## 2023-05-01 NOTE — ED Notes (Signed)
 Patient given another sprite per request.

## 2023-05-01 NOTE — ED Notes (Signed)
 Patient given a coke per request.

## 2023-05-01 NOTE — ED Notes (Signed)
 Patient to CT.

## 2023-05-01 NOTE — ED Notes (Signed)
 Patient back from imaging

## 2023-05-01 NOTE — H&P (Signed)
 PCP:   [name], NP   Chief Complaint:  Nausea vomiting diarrhea  HPI: This 58 year old female with past medical history significant for HLD, HTN, lymphedema, anxiety/depression, T2DM, peripheral neuropathy, hypothyroidism, GERD, COPD/asthma, chronic respiratory failure 3 L oxygen  baseline, history of anaphylaxis to penicillin.  At baseline patient wheelchair-bound.  Since early this morning she has developed significant nausea, vomiting and diarrhea.  It has been intractable.  She additionally complains of abdominal pains.  She is cold but denies fevers or chills.  She denies any recent travel.  She denies any sick contacts.  She denies any shortness of breath or chest pains.  In the ER vitals stable.  Labs stool culture positive for norovirus and febrile Cholera.  WBCs 16.5.  Abdominal images CT abdomen pelvis/MRI pelvis, pelvic ultrasound shows complex ovarian cyst. Oncologist on-call,/midwife Juliene Funk contacted  Review of Systems:  Per HPI  Past Medical History: Past Medical History:  Diagnosis Date   Arthritis    Asthma    COPD (chronic obstructive pulmonary disease) (HCC)    Hypertension    Neuropathy    Pericarditis    Personal history of urinary calculi 11/29/2014   Thyroid  disease    Past Surgical History:  Procedure Laterality Date   APPENDECTOMY     arm surgery Left    pins   fatty tumor removed from back     JOINT REPLACEMENT Right    LEG SURGERY Bilateral    OVARIAN CYST SURGERY Right    took piece of right ovary due to cyst   TOTAL HIP ARTHROPLASTY Right    wisdom teeth removed      Medications: Prior to Admission medications   Medication Sig Start Date End Date Taking? Authorizing Provider  ondansetron  (ZOFRAN ) 4 MG tablet Take 1 tablet (4 mg total) by mouth every 8 (eight) hours as needed. 05/01/23 04/30/24 Yes Malvina Alm DASEN, MD  ACCU-CHEK GUIDE test strip USE TO CHECK BLOOD SUGAR 3 TIMES DAILY AS DIRECTED 05/07/22   [name], NP  Accu-Chek  Softclix Lancets lancets CHECK BLOOD SUGAR TWICE DAILY 01/30/22   [name], NP  albuterol  (PROVENTIL ) (2.5 MG/3ML) 0.083% nebulizer solution USE 1 VIAL VIA NEBULIZER EVERY 4 HOURS AS NEEDED 11/09/22   Mecum, Erin E, PA-C  amLODipine  (NORVASC ) 5 MG tablet Take 5 mg by mouth daily. 01/29/23   [provider]  atorvastatin  (LIPITOR) 80 MG tablet Take 1 tablet (80 mg total) by mouth daily. 11/09/22   Mecum, Erin E, PA-C  Blood Glucose Monitoring Suppl (ACCU-CHEK GUIDE) w/Device KIT Use to check blood sugar 3 times a day. 12/11/20   [name], [name], NP  Blood Pressure Monitoring (OMRON 3 SERIES BP MONITOR) DEVI Use to check blood pressure as directed 07/23/21   [name], NP  citalopram  (CELEXA ) 40 MG tablet Take 1 tablet (40 mg total) by mouth daily. 11/09/22   Mecum, Erin E, PA-C  diphenhydrAMINE (BENADRYL) 25 mg capsule Take 25 mg by mouth every 6 (six) hours as needed for itching. Taking once a day    [provider]  docusate sodium  (COLACE) 100 MG capsule TAKE 1 CAPSULE BY MOUTH TWICE DAILY 04/02/23   [name], NP  Dulaglutide  (TRULICITY ) 0.75 MG/0.5ML SOPN 0.75 mg by ultrasound guided injection route once a week. 11/09/22   Mecum, Erin E, PA-C  EPINEPHrine  0.3 mg/0.3 mL IJ SOAJ injection INJECT 1 SYRINGE INTO OUTER THIGH ONCE AS NEEDED FOR SEVERE ALLERGIC REACTION. 12/17/22   Mecum, Erin E, PA-C  Fluticasone -Umeclidin-Vilant (  TRELEGY ELLIPTA ) 100-62.5-25 MCG/ACT AEPB Inhale 1 puff into the lungs daily. 02/02/22   Tamea Dedra CROME, MD  furosemide  (LASIX ) 20 MG tablet Take 1 tablet (20 mg total) by mouth 2 (two) times daily. 12/18/22   Gollan, Timothy J, MD  gabapentin  (NEURONTIN ) 300 MG capsule TAKE 1 CAPSULE BY MOUTH 3 TIMES DAILY ASNEEDED (NEED OFFICE VISIT FOR FURTHER REFILLS) Patient taking differently: Take 300 mg by mouth 3 (three) times daily as needed. 10/02/22   [name], NP  hydrOXYzine  (VISTARIL ) 25 MG capsule Take 1 capsule (25 mg total) by mouth  every 8 (eight) hours as needed. 11/09/22   Mecum, Erin E, PA-C  levothyroxine  (SYNTHROID ) 50 MCG tablet TAKE 1 TABLET BY MOUTH ONCE DAILY ON AN EMPTY STOMACH. WAIT 30 MINUTES BEFORE TAKING OTHER MEDS. 11/09/22   Mecum, Erin E, PA-C  lisinopril  (ZESTRIL ) 40 MG tablet TAKE 1 TABLET BY MOUTH ONCE DAILY 10/02/22   [name], NP  meloxicam  (MOBIC ) 15 MG tablet TAKE 1 TABLET BY MOUTH ONCE DAILY AS NEEDED WITH FOOD OR MILK 10/03/21   [name], NP  montelukast  (SINGULAIR ) 10 MG tablet TAKE 1 TABLET BY MOUTH AT BEDTIME 01/20/22   [name], NP  nitroGLYCERIN  (NITROSTAT ) 0.4 MG SL tablet DISSOLVE 1 TABLET UNDER TONGUE AT ONSET OF CHEST PAIN. REPEAT IN 5 MIN IF NOT RESOLVED, MAX 3 DOSES. 911 IF NEEDED. 08/07/22   [name], NP  omeprazole  (PRILOSEC) 20 MG capsule TAKE 1 CAPSULE BY MOUTH ONCE DAILY 11/26/21   [name], NP  OXYGEN  Inhale 3 L into the lungs daily.    [provider]  Silver (AQUACEL AG FOAM) 8X8 PADS Apply to areas of ulceration on legs as directed 02/02/23   Alexander, Natalie, DO  spironolactone  (ALDACTONE ) 25 MG tablet Take 0.5 tablets (12.5 mg total) by mouth daily. 12/05/21   [name], NP  Vitamin D , Ergocalciferol , (DRISDOL ) 1.25 MG (50000 UNIT) CAPS capsule TAKE 1 CAPSULE BY MOUTH EVERY 7 DAYS 05/07/22   [name], NP  Wound Cleansers (VASHE WOUND) 0.033 % SOLN Apply 1 Application topically daily. Use as directed to cleanse lower legs skin 02/02/23   Alexander, Natalie, DO    Allergies:   Allergies  Allergen Reactions   Codeine Shortness Of Breath and Rash   Percocet Chayo.cheers ] Shortness Of Breath   Sulfa Antibiotics Shortness Of Breath and Rash   Aspirin Hives   Bee Venom     Bee stings   Darvon [Propoxyphene]     Darvocet-N 100   Latex    Oxycodone Nausea And Vomiting   Penicillins     Has patient had a PCN reaction causing immediate rash, facial/tongue/throat swelling, SOB or lightheadedness with  hypotension: Unknown Has patient had a PCN reaction causing severe rash involving mucus membranes or skin necrosis: Unknown Has patient had a PCN reaction that required hospitalization: Unknown Has patient had a PCN reaction occurring within the last 10 years: Unknown If all of the above answers are NO, then may proceed with Cephalosporin use.    Social History:  reports that she has been smoking cigarettes. She started smoking about 31 years ago. She has a 90 pack-year smoking history. She has never used smokeless tobacco. She reports that she does not drink alcohol and does not use drugs.  Family History: Family History  Problem Relation Age of Onset   Hyperlipidemia Mother    Hypertension Mother    Heart disease Mother    Seizures Sister  disorders    Physical Exam: Vitals:   05/01/23 1530 05/01/23 1600 05/01/23 1830 05/01/23 1951  BP: (!) 148/64 (!) 164/45 138/78 (!) 112/97  Pulse: 97 96 95 92  Resp: (!) 21 (!) 28 18 20   Temp:  99.1 F (37.3 C)    TempSrc:  Oral    SpO2:  100% 100% 97%  Weight:      Height:        General: A&O x 3, extreme morbid obesity.  Uncomfortable-appearing..   Eyes: Pink conjunctiva, no scleral icterus ENT: Dry oral mucosa, neck supple, no thyromegaly Lungs: CTA B/L, no wheeze, no crackles, no use of accessory muscles Cardiovascular: RRR, no regurgitation, no gallops, no murmurs. No carotid bruits, no JVD Abdomen: soft, positive BS, obese abdomen, but still tenderness to palpation generalized nonspecific, not an acute abdomen GU: not examined Neuro: CN II - XII grossly intact, sensation intact Musculoskeletal: strength 5/5 all extremities.  Lymphedema with chronic changes  Skin: no rash, no subcutaneous crepitation, no decubitus Psych: appropriate patient  Labs on Admission:  Recent Labs    05/01/23 0510  NA 140  K 3.8  CL 104  CO2 19*  GLUCOSE 144*  BUN 20  CREATININE 0.88  CALCIUM  9.3   Recent Labs    05/01/23 0510   AST 26  ALT 27  ALKPHOS 101  BILITOT 0.5  PROT 8.4*  ALBUMIN 3.6   Recent Labs    05/01/23 0510  LIPASE 23   Recent Labs    05/01/23 0510  WBC 16.5*  HGB 13.5  HCT 42.7  MCV 89.1  PLT 413*    Micro Results: Recent Results (from the past 240 hours)  Gastrointestinal Panel by PCR , Stool     Status: Abnormal   Collection Time: 05/01/23  1:56 PM   Specimen: Stool  Result Value Ref Range Status   Campylobacter species NOT DETECTED NOT DETECTED Final   Plesimonas shigelloides NOT DETECTED NOT DETECTED Final   Salmonella species NOT DETECTED NOT DETECTED Final   Yersinia enterocolitica NOT DETECTED NOT DETECTED Final   Vibrio species DETECTED (A) NOT DETECTED Final    Comment: RESULT CALLED TO, READ BACK BY AND VERIFIED WITH: ROSINA COOLER AT 1542 05/01/23.PMF    Vibrio cholerae DETECTED (A) NOT DETECTED Final    Comment: RESULT CALLED TO, READ BACK BY AND VERIFIED WITH: ROSINA COOLER AT 1542 05/01/23.PMF    Enteroaggregative E coli (EAEC) NOT DETECTED NOT DETECTED Final   Enteropathogenic E coli (EPEC) NOT DETECTED NOT DETECTED Final   Enterotoxigenic E coli (ETEC) NOT DETECTED NOT DETECTED Final   Shiga like toxin producing E coli (STEC) NOT DETECTED NOT DETECTED Final   Shigella/Enteroinvasive E coli (EIEC) NOT DETECTED NOT DETECTED Final   Cryptosporidium NOT DETECTED NOT DETECTED Final   Cyclospora cayetanensis NOT DETECTED NOT DETECTED Final   Entamoeba histolytica NOT DETECTED NOT DETECTED Final   Giardia lamblia NOT DETECTED NOT DETECTED Final   Adenovirus F40/41 NOT DETECTED NOT DETECTED Final   Astrovirus NOT DETECTED NOT DETECTED Final   Norovirus GI/GII DETECTED (A) NOT DETECTED Final    Comment: RESULT CALLED TO, READ BACK BY AND VERIFIED WITH: ROSINA COOLER AT 1542 05/01/23.PMF    Rotavirus A NOT DETECTED NOT DETECTED Final   Sapovirus (I, II, IV, and V) NOT DETECTED NOT DETECTED Final    Comment: Performed at [facility], [address], [place], [place] [address]     Radiological Exams on Admission: MR PELVIS WO  CONTRAST Result Date: 05/01/2023 CLINICAL DATA:  Ovarian cystic lesion. EXAM: MRI PELVIS WITHOUT CONTRAST TECHNIQUE: Multiplanar multisequence MR imaging of the pelvis was performed. No intravenous contrast was administered. COMPARISON:  CT and ultrasound 05/01/2023. FINDINGS: Limited examination. Only 4 sequences were obtained and the entirety of the pelvis is not included on these images. Images are more centered over the sacrum. Of note as per the MRI technologist, the patient had difficulty tolerating the procedure. A complete study could not be obtained at this time. Urinary Tract: Grossly preserved contour. Bowel: Fluid along the rectosigmoid colon. The visualized bowel in the pelvis is nondilated. Vascular/Lymphatic: Limited visualization of the vasculature and lymph nodes in the pelvis. No obvious nodal enlargement., normal caliber central iliac vessels with flow voids. Study is without contrast. Reproductive: Uterus: Measures uterus measures 8.1 x 3.9 by 5.8 cm. Preserved myometrium and junctional zone. Endometrial stripe measures 5 mm. Right ovary: The right ovary is incompletely included in the imaging field due to the field-of-view. There is large cystic area in the midline of the pelvis which very well could relate to the patient's right ovary with a septation measuring at least 11.4 cm in size. What is seen is without obvious soft tissue component but again the lesion is not completely included in the imaging field and is without contrast. Left ovary: Left ovary measures 5.8 by 3.4 by 6.0 cm. Associated with the left ovary is a minimally complex cystic lesion with a few peripheral thin septations. This measures 4.8 by 5.5 by 2.6 cm. No contrast was administered assess for abnormal enhancement. Other: No significant free fluid in the pelvis. Musculoskeletal:  Unremarkable.  Limited IMPRESSION: Extremely limited  examination as the patient had difficulty tolerating the procedure. The entirety of the pelvis is not imaged nor are the female pelvic structures. There appear to be mildly complex ovarian cystic lesions measuring up to 11.4 cm on the right and 5.5 cm on the left. No free fluid in the pelvis. There is a differential. A cystic neoplasm of the ovary is possible. Based on size would recommend further evaluation. This is concerning for low-grade cystic neoplasm. GYN consult is recommended. Note: This recommendation does not apply to premenarchal patients and to those with increased risk (genetic, family history, elevated tumor markers or other high-risk factors) of ovarian cancer. Reference: JACR 2020 Feb; 17(2):248-254 Electronically Signed   By: Ranell Bring M.D.   On: 05/01/2023 19:21   US  PELVIS (TRANSABDOMINAL ONLY) Result Date: 05/01/2023 CLINICAL DATA:  Bilateral adnexal cysts seen on CT. Patient reports abdominal pain. Transvaginal exam was declined by the patient. EXAM: TRANSABDOMINAL ULTRASOUND OF PELVIS TECHNIQUE: Transabdominal ultrasound examination of the pelvis was performed including evaluation of the uterus, ovaries, adnexal regions, and pelvic cul-de-sac. COMPARISON:  CT abdomen and pelvis performed earlier today FINDINGS: Uterus Not visualized. Right ovary Not visualized. Left ovary Not visualized. Other findings: A cyst with a 3 mm septation in the midline measures 10.0 x 6.3 x 11.0 cm. This corresponds to the finding described on same day CT abdomen and pelvis, however is difficult to determine if this arises from either adnexa. IMPRESSION: Septated cyst in the midline corresponds to the finding described on same day CT abdomen and pelvis, however it is difficult to determine if this arises from either adnexa. Electronically Signed   By: Norman Hopper M.D.   On: 05/01/2023 15:53   CT ABDOMEN PELVIS W CONTRAST Result Date: 05/01/2023 CLINICAL DATA:  Abdominal pain.  Nausea vomiting and diarrhea  EXAM: CT ABDOMEN AND PELVIS WITH CONTRAST TECHNIQUE: Multidetector CT imaging of the abdomen and pelvis was performed using the standard protocol following bolus administration of intravenous contrast. RADIATION DOSE REDUCTION: This exam was performed according to the departmental dose-optimization program which includes automated exposure control, adjustment of the mA and/or kV according to patient size and/or use of iterative reconstruction technique. CONTRAST:  OMNIPAQUE  IOHEXOL  300 MG/ML  SOLN COMPARISON:  08/05/2014 FINDINGS: Lower chest: No acute abnormality. Hepatobiliary: No focal liver abnormality is seen. No gallstones, gallbladder wall thickening, or biliary dilatation. Pancreas: Unremarkable. No pancreatic ductal dilatation or surrounding inflammatory changes. Spleen: Normal in size without focal abnormality. Adrenals/Urinary Tract: Right adrenal gland is normal. 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 Increased from 1.8 by 1.8 cm previously. No nephrolithiasis, hydronephrosis or suspicious mass. Urinary bladder appears unremarkable. Stomach/Bowel: Stomach appears normal and nondistended. No pathologic dilatation of the large or small bowel loops. Liquid stool identified throughout the colon. No bowel wall thickening or inflammatory change identified. The appendix is surgically absent. Vascular/Lymphatic: Patent upper abdominal vascularity. Aortic atherosclerotic calcifications. No aneurysm. Prominent bilateral iliac lymph nodes are identified. The left common iliac node measures 1 cm, image 57/2. There is a left external iliac node which measures 1.3 cm, image 73/2. Left inguinal lymph node measures 1.5 cm, image 93/2. Reproductive: The uterus appears normal. There is a simple appearing cyst with in the left ovary measuring 5.7 x 3.6 cm, image 67/2. Previously 3.7 by 2.3 cm. Ovary cystic structure within the lower abdomen containing thin internal area of  septation is felt to arise off the right adnexa measuring 10.7 by 6.6 cm, image 46/5. On the previous exam there was a right ovary cyst measuring 5.3 x 3.1 cm. Other: No ascites identified. No signs of peritoneal nodularity. Laxity of the ventral abdominal wall is identified. Small fat containing umbilical hernia. Musculoskeletal: No acute or suspicious osseous findings. Signs of previous instrumentation within bilateral femurs. IMPRESSION: 1. Liquid stool identified throughout the colon compatible with diarrheal illness. No bowel wall thickening or inflammatory change identified. 2. Bilocular cyst is felt to arise from the right ovary and is displaced into the central portion of the lower abdomen with a maximum dimension of 10.7 cm. This is concerning for low-grade cystic neoplasm. Recommend GYN consult and consider pelvis MRI w/o and w/ contrast if clinically warranted. Note: This recommendation does not apply to premenarchal patients and to those with increased risk (genetic, family history, elevated tumor markers or other high-risk factors) of ovarian cancer. Reference: JACR 2020 Feb; 17(2):248-254 3. Left ovary cyst measures 5.7 cm and is increased from 3.7 cm previously. 4. Prominent bilateral iliac and left inguinal lymph nodes are identified. These are nonspecific. 5. 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 Increased from 1.8 by 1.8 cm previously. 6.  Aortic Atherosclerosis (ICD10-I70.0). Electronically Signed   By: Waddell Calk M.D.   On: 05/01/2023 12:14    Assessment/Plan Present on Admission:  Gastroenteritis d/t norovirus and Cholera Vibrio -EDP contacted ID on-call.  Recommended a single dose of azithromycin .  Would like to follow in AM.  Azithromycin  prescribed by EDP -IV fluid hydration.  No antidiarrheals initiated tonight.  Patient still with significant diarrhea -Pain meds as needed -Continue supportive care   Complex ovarian cyst -Midwife Wells Funk contacted by EDP.    -CA 19-9 ordered   COPD (chronic obstructive pulmonary disease) (HCC)  Chronic respiratory failure 3 L baseline -  Continue oxygen , nebulizers as needed -Trelegy resumed   Essential hypertension -Norvasc , lisinopril    HLD (hyperlipidemia) -Lipitor resumed   T2DM //  Peripheral neuropathy -Sliding scale insulin  -Neurontin  as needed peripheral neuropathy ordered   Depression -Citalopram  resumed   Hypothyroidism -Synthroid  resumed   Nicotine dependence, cigarettes, uncomplicated -Declines nicotine patch.  Leathia Farnell 05/01/2023, 9:35 PM

## 2023-05-01 NOTE — ED Provider Notes (Signed)
 [facility] Provider Note    Event Date/Time   First MD Initiated Contact with Patient 05/01/23 1002     (approximate)   History   Abdominal Pain   HPI Cheryl Chandler is a 58 y.o. female with history of COPD on 3L chronically, HTN, HLD, DM2 presenting today for abdominal pain.  Patient states having onset of generalized abdominal pain, nausea, vomiting, and diarrhea early this morning.  She was given Zofran  with EMS and still had continued vomiting.  Has not been able to tolerate any p.o. intake.  Feels generalized bodyaches as well.  Otherwise denies cough, congestion, shortness of breath, chest pain, dysuria, melena, hematochezia.  Denies any recent sick contacts.     Physical Exam   Triage Vital Signs: ED Triage Vitals  Encounter Vitals Group     BP 05/01/23 0504 (!) 159/65     Systolic BP Percentile --      Diastolic BP Percentile --      Pulse Rate 05/01/23 0504 86     Resp 05/01/23 0504 (!) 22     Temp 05/01/23 0504 98.1 F (36.7 C)     Temp Source 05/01/23 0504 Oral     SpO2 05/01/23 0504 100 %     Weight 05/01/23 0503 (!) 315 lb 0.6 oz (142.9 kg)     Height 05/01/23 0503 5' 2 (1.575 m)     Head Circumference --      Peak Flow --      Pain Score 05/01/23 0503 10     Pain Loc --      Pain Education --      Exclude from Growth Chart --     Most recent vital signs: Vitals:   05/01/23 1330 05/01/23 1400  BP: (!) 179/56 (!) 157/55  Pulse: 93 96  Resp: 20 (!) 25  Temp:  99 F (37.2 C)  SpO2: 100% 100%   Physical Exam: I have reviewed the vital signs and nursing notes. General: Awake, alert, no acute distress.  Nontoxic appearing. Head:  Atraumatic, normocephalic.   ENT:  EOM intact, PERRL. Oral mucosa is pink and moist with no lesions. Neck: Neck is supple with full range of motion, No meningeal signs. Cardiovascular:  RRR, No murmurs. Peripheral pulses palpable and equal bilaterally. Respiratory:  Symmetrical chest wall  expansion.  No rhonchi, rales, or wheezes.  Good air movement throughout.  No use of accessory muscles.   Musculoskeletal:  No cyanosis or edema. Moving extremities with full ROM Abdomen:  Soft, generalized tenderness palpation throughout abdomen, nondistended. Neuro:  GCS 15, moving all four extremities, interacting appropriately. Speech clear. Psych:  Calm, appropriate.   Skin:  Warm, dry, no rash.    ED Results / Procedures / Treatments   Labs (all labs ordered are listed, but only abnormal results are displayed) Labs Reviewed  COMPREHENSIVE METABOLIC PANEL - Abnormal; Notable for the following components:      Result Value   CO2 19 (*)    Glucose, Bld 144 (*)    Total Protein 8.4 (*)    Anion gap 17 (*)    All other components within normal limits  CBC - Abnormal; Notable for the following components:   WBC 16.5 (*)    RDW 15.9 (*)    Platelets 413 (*)    All other components within normal limits  BETA-HYDROXYBUTYRIC ACID - Abnormal; Notable for the following components:   Beta-Hydroxybutyric Acid 0.31 (*)    All other  components within normal limits  GASTROINTESTINAL PANEL BY PCR, STOOL (REPLACES STOOL CULTURE)  LIPASE, BLOOD  HCG, QUANTITATIVE, PREGNANCY  URINALYSIS, ROUTINE W REFLEX MICROSCOPIC  CA 125     EKG    RADIOLOGY Independently interpreted CT imaging and agree with radiology read   PROCEDURES:  Critical Care performed: No  Procedures   MEDICATIONS ORDERED IN ED: Medications  fentaNYL  (SUBLIMAZE ) injection 50 mcg (has no administration in time range)  ondansetron  (ZOFRAN ) injection 4 mg (has no administration in time range)  sodium chloride  0.9 % bolus 1,000 mL (0 mLs Intravenous Stopped 05/01/23 0836)  ondansetron  (ZOFRAN ) injection 4 mg (4 mg Intravenous Given 05/01/23 0520)  fentaNYL  (SUBLIMAZE ) injection 50 mcg (50 mcg Intravenous Given 05/01/23 0627)  iohexol  (OMNIPAQUE ) 300 MG/ML solution 100 mL (100 mLs Intravenous Contrast Given 05/01/23 1138)   metoCLOPramide  (REGLAN ) injection 10 mg (10 mg Intravenous Given 05/01/23 1112)  fentaNYL  (SUBLIMAZE ) injection 50 mcg (50 mcg Intravenous Given 05/01/23 1113)     IMPRESSION / MDM / ASSESSMENT AND PLAN / ED COURSE  I reviewed the triage vital signs and the nursing notes.                              Differential diagnosis includes, but is not limited to, viral gastroenteritis, appendicitis, diverticulitis, pancreatitis, SBO  Patient's presentation is most consistent with acute complicated illness / injury requiring diagnostic workup.  Patient is a 58 year old female presenting today for generalized abdominal pain associated with nausea, vomiting, and diarrhea.  Vital signs stable with no tachycardia or fever.  Largely most suspecting viral gastroenteritis.  Patient was given pain and nausea medication with improvement in symptoms.  Also gave her 1 L of fluids.  Reassessed with ongoing symptoms and redosed pain and nausea medication.  CBC with mild leukocytosis.  Slight hypocarbia with anion gap on CMP likely related to dehydration.  CT abdomen/pelvis shows diarrheal illness likely suspecting viral gastroenteritis as source of symptoms today.  She was tolerating p.o. without any difficulty and symptoms well-controlled.  Separately, there was an incidental finding of a large right-sided ovarian cyst as well as a smaller left-sided cyst.  Discussed case with gynecology who initially recommended ultrasound here in the ED for further evaluation.  They had difficult time getting complete images and requested us  to get an MRI for her before patient was discharged.  Patient signed out to oncoming provider pending results of MRI and discussion with gynecology before discharge.  Otherwise she is tolerating p.o. and suspect she can go home with Zofran  as needed for her viral GI infection.  The patient is on the cardiac monitor to evaluate for evidence of arrhythmia and/or significant heart rate  changes. Clinical Course as of 05/01/23 1537  Sat May 01, 2023  1014 Comprehensive metabolic panel(!) Mild hypocarbia with anion gap acidosis likely secondary to nausea vomiting and diarrhea. [DW]  1237 Patient feeling better at this time.  CT imaging likely indicating viral GI infection as source of her symptoms.  She would like to try p.o. and attempt to go home. [DW]  1329 Tolerating p.o. without issue.  Discussed with gynecology the large right-sided cyst.  They recommend ultrasound here and also placed additional workup for cancer markers.  They stated afterwards that as long as patient is still feeling okay then she can be discharged with outpatient follow-up [DW]    Clinical Course User Index [DW] Malvina Alm DASEN, MD  FINAL CLINICAL IMPRESSION(S) / ED DIAGNOSES   Final diagnoses:  Nausea vomiting and diarrhea  Right ovarian cyst     Rx / DC Orders   ED Discharge Orders          Ordered    ondansetron  (ZOFRAN ) 4 MG tablet  Every 8 hours PRN        05/01/23 1537             Note:  This document was prepared using Dragon voice recognition software and may include unintentional dictation errors.   Malvina Alm DASEN, MD 05/01/23 1539

## 2023-05-01 NOTE — ED Notes (Signed)
 Pt gastro panel + for: Norovirus  Vibrio Vibrio cholerae

## 2023-05-01 NOTE — ED Notes (Signed)
 Patient returned from CT

## 2023-05-01 NOTE — ED Notes (Signed)
 IV has been dislodged. Unable to obtain IV access at this time. IV team consult placed.

## 2023-05-01 NOTE — ED Triage Notes (Addendum)
 Patient bib ems from home for generalized abdominal pain and n/v/d starting this morning. Patient arrives on 3L home oxygen. 4mg  zofran IM given PTA.

## 2023-05-01 NOTE — ED Notes (Signed)
Patient given sprite for PO challenge.  

## 2023-05-01 NOTE — Progress Notes (Signed)
 A consult was placed to the IV Nurse for new iv access;  right arm assessed thoroughly with ultrasound; no veins noted;  able to place iv in the left forearm with ultrasound; pt with very poor peripheral access;  suggest PICC if she will need continued access.

## 2023-05-01 NOTE — ED Notes (Signed)
 Patient given two more sprites.

## 2023-05-01 NOTE — ED Notes (Addendum)
 Pt back from CT. Pt labored w/ tachypnea. Pt covered in feces and urine. Pt assisted to stretcher by 4 staff members. Pt actively vomiting multiple times in triage. Pt changed multiple times by RN and EDT. NP placed new orders, will administer. Acuity changed due to presentation.   Unable to get CT done due to vomiting.

## 2023-05-01 NOTE — ED Notes (Signed)
Patient assisted to bedside toilet

## 2023-05-02 DIAGNOSIS — A0811 Acute gastroenteropathy due to Norwalk agent: Secondary | ICD-10-CM | POA: Diagnosis not present

## 2023-05-02 DIAGNOSIS — A009 Cholera, unspecified: Secondary | ICD-10-CM

## 2023-05-02 DIAGNOSIS — K529 Noninfective gastroenteritis and colitis, unspecified: Secondary | ICD-10-CM | POA: Diagnosis not present

## 2023-05-02 LAB — MAGNESIUM: Magnesium: 2 mg/dL (ref 1.7–2.4)

## 2023-05-02 LAB — BASIC METABOLIC PANEL
Anion gap: 13 (ref 5–15)
BUN: 13 mg/dL (ref 6–20)
CO2: 20 mmol/L — ABNORMAL LOW (ref 22–32)
Calcium: 8 mg/dL — ABNORMAL LOW (ref 8.9–10.3)
Chloride: 104 mmol/L (ref 98–111)
Creatinine, Ser: 0.8 mg/dL (ref 0.44–1.00)
GFR, Estimated: >60 mL/min (ref >60)
Glucose, Bld: 112 mg/dL — ABNORMAL HIGH (ref 70–99)
Potassium: 3.4 mmol/L — ABNORMAL LOW (ref 3.5–5.1)
Sodium: 137 mmol/L (ref 135–145)

## 2023-05-02 LAB — CBC WITH DIFFERENTIAL/PLATELET
Abs Immature Granulocytes: 0.03 10*3/uL (ref 0.00–0.07)
Basophils Absolute: 0 10*3/uL (ref 0.0–0.1)
Basophils Relative: 0 %
Eosinophils Absolute: 0 10*3/uL (ref 0.0–0.5)
Eosinophils Relative: 1 %
HCT: 33.8 % — ABNORMAL LOW (ref 36.0–46.0)
Hemoglobin: 10.9 g/dL — ABNORMAL LOW (ref 12.0–15.0)
Immature Granulocytes: 0 %
Lymphocytes Relative: 8 %
Lymphs Abs: 0.6 10*3/uL — ABNORMAL LOW (ref 0.7–4.0)
MCH: 27.8 pg (ref 26.0–34.0)
MCHC: 32.2 g/dL (ref 30.0–36.0)
MCV: 86.2 fL (ref 80.0–100.0)
Monocytes Absolute: 0.6 10*3/uL (ref 0.1–1.0)
Monocytes Relative: 8 %
Neutro Abs: 5.7 10*3/uL (ref 1.7–7.7)
Neutrophils Relative %: 83 %
Platelets: 328 10*3/uL (ref 150–400)
RBC: 3.92 MIL/uL (ref 3.87–5.11)
RDW: 16 % — ABNORMAL HIGH (ref 11.5–15.5)
WBC: 7 10*3/uL (ref 4.0–10.5)
nRBC: 0 % (ref 0.0–0.2)

## 2023-05-02 LAB — CBG MONITORING, ED
Glucose-Capillary: 104 mg/dL — ABNORMAL HIGH (ref 70–99)
Glucose-Capillary: 98 mg/dL (ref 70–99)
Glucose-Capillary: 73 mg/dL (ref 70–99)
Glucose-Capillary: 87 mg/dL (ref 70–99)

## 2023-05-02 MED ORDER — LACTATED RINGERS IV SOLN: INTRAVENOUS | Status: AC

## 2023-05-02 MED ORDER — ONDANSETRON 4 MG PO TBDP
4.0000 mg | ORAL_TABLET | Freq: Four times a day (QID) | ORAL | Status: AC
Start: 2023-05-02 — End: 2023-05-03
  Administered 2023-05-02 – 2023-05-03 (×4): 4 mg via ORAL
  Filled 2023-05-02 (×4): qty 1

## 2023-05-02 MED ORDER — LOPERAMIDE HCL 2 MG PO CAPS
4.0000 mg | ORAL_CAPSULE | Freq: Once | ORAL | Status: AC
  Administered 2023-05-02: 4 mg via ORAL
  Filled 2023-05-02: qty 2

## 2023-05-02 MED ORDER — GABAPENTIN 300 MG PO CAPS
300.0000 mg | ORAL_CAPSULE | Freq: Three times a day (TID) | ORAL | Status: DC | PRN
  Administered 2023-05-03: 300 mg via ORAL
  Filled 2023-05-02: qty 1

## 2023-05-02 NOTE — Progress Notes (Signed)
 PROGRESS NOTE    Cheryl Chandler  FMW:[id number] DOB: 1965-07-14 DOA: 05/01/2023 PCP: [name], NP     Brief Narrative:   From admission h and p  This 58 year old female with past medical history significant for HLD, HTN, lymphedema, anxiety/depression, T2DM, peripheral neuropathy, hypothyroidism, GERD, COPD/asthma, chronic respiratory failure 3 L oxygen  baseline, history of anaphylaxis to penicillin.  At baseline patient wheelchair-bound.  Since early this morning she has developed significant nausea, vomiting and diarrhea.  It has been intractable.  She additionally complains of abdominal pains.  She is cold but denies fevers or chills.  She denies any recent travel.  She denies any sick contacts.  She denies any shortness of breath or chest pains.   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 Improving. No sig aki or electrolyte derangement - continue fluids - scheduled zofran  - loperamide  4 mg x1  # Vibrio cholera Also on gi pathogen panel. Discussed with ID, likely colonizer. Did receive azithromycin  1 g x1 yesterday - supportive care as above  # Adnexal mass Complex ovarian cystic lesions 11.4 on right and 5.5 cm on left. Gyn consulted - tumor markers ordered - gyn advises outpt f/u (Dr. Lovetta)  # HTN Here bp wnl in setting of diarrheal illness - hold home amlodipine , atorvastatin , lasix , lisinopril , spironolactone   # Chronic  pain - home gabapentin   # GAD - home celexa   # COPD # Chronic hypoxic respiratory failu No exacerbation, on home O2 - cont home breo/incruse  # Hypothyroid - cont home levo  # T2DM Euglycemic - SSI, hold home trulicity    DVT prophylaxis: heparin  Code Status: full Family Communication: father frank updated telephonically 1/5  Level of care: Med-Surg Status is: Inpatient Remains inpatient appropriate because: need for IV fluids    Consultants:  Ob/gyn  Procedures: none  Antimicrobials:  S/p azithromycin     Subjective: Mild nausea, tolerating liquids,   Objective: Vitals:   05/02/23 0600 05/02/23 0630 05/02/23 0735 05/02/23 0830  BP: (!) 157/65 (!) 149/64 (!) 116/45 (!) 107/31  Pulse: 87 80 79 73  Resp: 17 17 17    Temp:   98.4 F (36.9 C)   TempSrc:   Oral   SpO2: 99% 97% 98%   Weight:      Height:       No intake or output data in the 24 hours ending 05/02/23 0954 Filed Weights   05/01/23 0503  Weight: (!) 142.9 kg    Examination:  General exam: Appears calm and comfortable  Respiratory system: Clear to auscultation. Respiratory effort normal. Cardiovascular system: S1 & S2 heard, RRR.   Gastrointestinal system: Abdomen is nondistended, soft and nontender. obese Central nervous system: Alert and oriented. No focal neurological deficits. Extremities: Symmetric 5 x 5 power. Pitting edema to thighs Skin: No rashes, lesions or ulcers Psychiatry: Judgement and insight appear normal. Mood & affect appropriate.     Data Reviewed: I have personally reviewed following labs and imaging studies  CBC:  Recent Labs  Lab 05/01/23 0510 05/02/23 0446  WBC 16.5* 7.0  NEUTROABS  --  5.7  HGB 13.5 10.9*  HCT 42.7 33.8*  MCV 89.1 86.2  PLT 413* 328   Basic Metabolic Panel: Recent Labs  Lab 05/01/23 0510 05/01/23 1356 05/02/23 0446  NA 140  --  137  K 3.8  --  3.4*  CL 104  --  104  CO2 19*  --  20*  GLUCOSE 144*  --  112*  BUN 20  --  13   CREATININE 0.88  --  0.80  CALCIUM  9.3  --  8.0*  MG  --  2.4 2.0   GFR: Estimated Creatinine Clearance: 108.1 mL/min (by C-G formula based on SCr of 0.8 mg/dL). Liver Function Tests: Recent Labs  Lab 05/01/23 0510  AST 26  ALT 27  ALKPHOS 101  BILITOT 0.5  PROT 8.4*  ALBUMIN 3.6   Recent Labs  Lab 05/01/23 0510  LIPASE 23   No results for input(s): AMMONIA in the last 168 hours. Coagulation Profile: No results for input(s): INR, PROTIME in the last 168 hours. Cardiac Enzymes: No results for input(s): CKTOTAL, CKMB, CKMBINDEX, TROPONINI in the last 168 hours. BNP (last 3 results) No results for input(s): PROBNP in the last 8760 hours. HbA1C: No results for input(s): HGBA1C in the last 72 hours. CBG: Recent Labs  Lab 05/01/23 2227 05/02/23 0734  GLUCAP 121* 104*   Lipid Profile: No results for input(s): CHOL, HDL, LDLCALC, TRIG, CHOLHDL, LDLDIRECT in the last 72 hours. Thyroid  Function Tests: No results for input(s): TSH, T4TOTAL, FREET4, T3FREE, THYROIDAB in the last 72 hours. Anemia Panel: No results for input(s): VITAMINB12, FOLATE, FERRITIN, TIBC, IRON, RETICCTPCT in the last 72 hours. Urine analysis:    Component Value Date/Time   COLORURINE STRAW (A) 02/05/2021 1420   APPEARANCEUR Cloudy (A) 06/04/2021 1428   LABSPEC 1.006 02/05/2021 1420   LABSPEC 1.025 08/05/2014 1306   PHURINE 7.0 02/05/2021 1420   GLUCOSEU Negative 06/04/2021 1428   GLUCOSEU Negative 08/05/2014 1306   HGBUR NEGATIVE 02/05/2021 1420   BILIRUBINUR Negative 06/04/2021 1428   BILIRUBINUR Negative 08/05/2014 1306   KETONESUR NEGATIVE 02/05/2021 1420   PROTEINUR Negative 06/04/2021 1428   PROTEINUR NEGATIVE 02/05/2021 1420   NITRITE Negative 06/04/2021 1428   NITRITE NEGATIVE 02/05/2021 1420   LEUKOCYTESUR Negative 06/04/2021 1428   LEUKOCYTESUR NEGATIVE 02/05/2021 1420   LEUKOCYTESUR Negative 08/05/2014 1306   Sepsis  Labs: @LABRCNTIP (procalcitonin:4,lacticidven:4)  ) Recent Results (from the past 240 hours)  Gastrointestinal Panel by PCR , Stool     Status: Abnormal   Collection Time: 05/01/23  1:56 PM   Specimen: Stool  Result Value Ref Range Status   Campylobacter species NOT DETECTED NOT DETECTED Final   Plesimonas shigelloides NOT DETECTED NOT DETECTED Final   Salmonella species NOT DETECTED NOT DETECTED Final   Yersinia enterocolitica NOT DETECTED NOT DETECTED Final   Vibrio species DETECTED (A) NOT DETECTED Final    Comment: RESULT CALLED TO, READ BACK BY AND VERIFIED WITH: ROSINA COOLER AT 1542 05/01/23.PMF    Vibrio cholerae DETECTED (A) NOT DETECTED Final    Comment: RESULT CALLED TO, READ BACK BY AND VERIFIED WITH: ROSINA COOLER AT 1542 05/01/23.PMF    Enteroaggregative E coli (EAEC) NOT DETECTED NOT DETECTED Final   Enteropathogenic E coli (EPEC) NOT DETECTED NOT DETECTED Final   Enterotoxigenic E coli (ETEC) NOT DETECTED NOT DETECTED Final   Shiga like toxin producing E coli (STEC) NOT DETECTED NOT  DETECTED Final   Shigella/Enteroinvasive E coli (EIEC) NOT DETECTED NOT DETECTED Final   Cryptosporidium NOT DETECTED NOT DETECTED Final   Cyclospora cayetanensis NOT DETECTED NOT DETECTED Final   Entamoeba histolytica NOT DETECTED NOT DETECTED Final   Giardia lamblia NOT DETECTED NOT DETECTED Final   Adenovirus F40/41 NOT DETECTED NOT DETECTED Final   Astrovirus NOT DETECTED NOT DETECTED Final   Norovirus GI/GII DETECTED (A) NOT DETECTED Final    Comment: RESULT CALLED TO, READ BACK BY AND VERIFIED WITH: ROSINA COOLER AT 1542 05/01/23.PMF    Rotavirus A NOT DETECTED NOT DETECTED Final   Sapovirus (I, II, IV, and V) NOT DETECTED NOT DETECTED Final    Comment: Performed at [facility], [address] [place], [place] [address]         Radiology Studies: MR PELVIS WO CONTRAST Result Date: 05/01/2023 CLINICAL DATA:  Ovarian cystic lesion. EXAM: MRI PELVIS WITHOUT  CONTRAST TECHNIQUE: Multiplanar multisequence MR imaging of the pelvis was performed. No intravenous contrast was administered. COMPARISON:  CT and ultrasound 05/01/2023. FINDINGS: Limited examination. Only 4 sequences were obtained and the entirety of the pelvis is not included on these images. Images are more centered over the sacrum. Of note as per the MRI technologist, the patient had difficulty tolerating the procedure. A complete study could not be obtained at this time. Urinary Tract: Grossly preserved contour. Bowel: Fluid along the rectosigmoid colon. The visualized bowel in the pelvis is nondilated. Vascular/Lymphatic: Limited visualization of the vasculature and lymph nodes in the pelvis. No obvious nodal enlargement., normal caliber central iliac vessels with flow voids. Study is without contrast. Reproductive: Uterus: Measures uterus measures 8.1 x 3.9 by 5.8 cm. Preserved myometrium and junctional zone. Endometrial stripe measures 5 mm. Right ovary: The right ovary is incompletely included in the imaging field due to the field-of-view. There is large cystic area in the midline of the pelvis which very well could relate to the patient's right ovary with a septation measuring at least 11.4 cm in size. What is seen is without obvious soft tissue component but again the lesion is not completely included in the imaging field and is without contrast. Left ovary: Left ovary measures 5.8 by 3.4 by 6.0 cm. Associated with the left ovary is a minimally complex cystic lesion with a few peripheral thin septations. This measures 4.8 by 5.5 by 2.6 cm. No contrast was administered assess for abnormal enhancement. Other: No significant free fluid in the pelvis. Musculoskeletal:  Unremarkable.  Limited IMPRESSION: Extremely limited examination as the patient had difficulty tolerating the procedure. The entirety of the pelvis is not imaged nor are the female pelvic structures. There appear to be mildly complex ovarian  cystic lesions measuring up to 11.4 cm on the right and 5.5 cm on the left. No free fluid in the pelvis. There is a differential. A cystic neoplasm of the ovary is possible. Based on size would recommend further evaluation. This is concerning for low-grade cystic neoplasm. GYN consult is recommended. Note: This recommendation does not apply to premenarchal patients and to those with increased risk (genetic, family history, elevated tumor markers or other high-risk factors) of ovarian cancer. Reference: JACR 2020 Feb; 17(2):248-254 Electronically Signed   By: Ranell Bring M.D.   On: 05/01/2023 19:21   US  PELVIS (TRANSABDOMINAL ONLY) Result Date: 05/01/2023 CLINICAL DATA:  Bilateral adnexal cysts seen on CT. Patient reports abdominal pain. Transvaginal exam was declined by the patient. EXAM: TRANSABDOMINAL ULTRASOUND OF PELVIS TECHNIQUE: Transabdominal ultrasound examination  of the pelvis was performed including evaluation of the uterus, ovaries, adnexal regions, and pelvic cul-de-sac. COMPARISON:  CT abdomen and pelvis performed earlier today FINDINGS: Uterus Not visualized. Right ovary Not visualized. Left ovary Not visualized. Other findings: A cyst with a 3 mm septation in the midline measures 10.0 x 6.3 x 11.0 cm. This corresponds to the finding described on same day CT abdomen and pelvis, however is difficult to determine if this arises from either adnexa. IMPRESSION: Septated cyst in the midline corresponds to the finding described on same day CT abdomen and pelvis, however it is difficult to determine if this arises from either adnexa. Electronically Signed   By: Norman Hopper M.D.   On: 05/01/2023 15:53   CT ABDOMEN PELVIS W CONTRAST Result Date: 05/01/2023 CLINICAL DATA:  Abdominal pain.  Nausea vomiting and diarrhea EXAM: CT ABDOMEN AND PELVIS WITH CONTRAST TECHNIQUE: Multidetector CT imaging of the abdomen and pelvis was performed using the standard protocol following bolus administration of  intravenous contrast. RADIATION DOSE REDUCTION: This exam was performed according to the departmental dose-optimization program which includes automated exposure control, adjustment of the mA and/or kV according to patient size and/or use of iterative reconstruction technique. CONTRAST:  OMNIPAQUE  IOHEXOL  300 MG/ML  SOLN COMPARISON:  08/05/2014 FINDINGS: Lower chest: No acute abnormality. Hepatobiliary: No focal liver abnormality is seen. No gallstones, gallbladder wall thickening, or biliary dilatation. Pancreas: Unremarkable. No pancreatic ductal dilatation or surrounding inflammatory changes. Spleen: Normal in size without focal abnormality. Adrenals/Urinary Tract: Right adrenal gland is normal. 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 Increased from 1.8 by 1.8 cm previously. No nephrolithiasis, hydronephrosis or suspicious mass. Urinary bladder appears unremarkable. Stomach/Bowel: Stomach appears normal and nondistended. No pathologic dilatation of the large or small bowel loops. Liquid stool identified throughout the colon. No bowel wall thickening or inflammatory change identified. The appendix is surgically absent. Vascular/Lymphatic: Patent upper abdominal vascularity. Aortic atherosclerotic calcifications. No aneurysm. Prominent bilateral iliac lymph nodes are identified. The left common iliac node measures 1 cm, image 57/2. There is a left external iliac node which measures 1.3 cm, image 73/2. Left inguinal lymph node measures 1.5 cm, image 93/2. Reproductive: The uterus appears normal. There is a simple appearing cyst with in the left ovary measuring 5.7 x 3.6 cm, image 67/2. Previously 3.7 by 2.3 cm. Ovary cystic structure within the lower abdomen containing thin internal area of septation is felt to arise off the right adnexa measuring 10.7 by 6.6 cm, image 46/5. On the previous exam there was a right ovary cyst measuring 5.3 x 3.1 cm. Other: No ascites  identified. No signs of peritoneal nodularity. Laxity of the ventral abdominal wall is identified. Small fat containing umbilical hernia. Musculoskeletal: No acute or suspicious osseous findings. Signs of previous instrumentation within bilateral femurs. IMPRESSION: 1. Liquid stool identified throughout the colon compatible with diarrheal illness. No bowel wall thickening or inflammatory change identified. 2. Bilocular cyst is felt to arise from the right ovary and is displaced into the central portion of the lower abdomen with a maximum dimension of 10.7 cm. This is concerning for low-grade cystic neoplasm. Recommend GYN consult and consider pelvis MRI w/o and w/ contrast if clinically warranted. Note: This recommendation does not apply to premenarchal patients and to those with increased risk (genetic, family history, elevated tumor markers or other high-risk factors) of ovarian cancer. Reference: JACR 2020 Feb; 17(2):248-254 3. Left ovary cyst measures 5.7 cm and is  increased from 3.7 cm previously. 4. Prominent bilateral iliac and left inguinal lymph nodes are identified. These are nonspecific. 5. 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 Increased from 1.8 by 1.8 cm previously. 6.  Aortic Atherosclerosis (ICD10-I70.0). Electronically Signed   By: Waddell Calk M.D.   On: 05/01/2023 12:14        Scheduled Meds:  citalopram   40 mg Oral Daily   fluticasone  furoate-vilanterol  1 puff Inhalation Daily   And   umeclidinium bromide   1 puff Inhalation Daily   heparin   5,000 Units Subcutaneous Q8H   insulin  aspart  0-15 Units Subcutaneous TID WC   insulin  aspart  0-5 Units Subcutaneous QHS   levothyroxine   50 mcg Oral Q0600   liver oil-zinc  oxide   Topical BID   Continuous Infusions:  lactated ringers  125 mL/hr at 05/02/23 0503     LOS: 1 day     Devaughn KATHEE Ban, MD [facility]   If 7PM-7AM, please contact night-coverage [email or web address] Password [facility] 05/02/2023, 9:54 AM

## 2023-05-02 NOTE — Progress Notes (Signed)
    Brief ID note  I was called about this patient last night by ER and then again via secure chat by Dr. 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  I spent more than 10 minutes giving advice to ER MD and to Dr. Kandis  I will forward to Dr. Fayette in case she wants to provide in person consultation in the am though I am not sure what else there is to do from an ID standpoint. The Lab should be in communication with the state.

## 2023-05-02 NOTE — ED Notes (Signed)
 Pt at this time assisted to the bedside commode +urine. Pt endorses 10/10 pain, medicated per PRN MAR.

## 2023-05-02 NOTE — ED Notes (Signed)
 Pt given pillow for comfort, VSS. Given ginger ale per request. Pending inpt bed.

## 2023-05-02 NOTE — ED Notes (Signed)
 Pt assisted off bsc and pericare provided

## 2023-05-02 NOTE — Progress Notes (Signed)
 Physical Therapy Evaluation Patient Details Name: Cheryl Chandler MRN: [id number] DOB: 1966/01/23 Today's Date: 05/02/2023  History of Present Illness  Pt is a 58 y/o female admitted secondary to nausea, vomiting and diarrhea. Pt found to have Gastroenteritis d/t norovirus and Cholera Vibrio. Pt also with an incidental finding of complex ovarian cysts. PMH including but not limited to HLD, HTN, lymphedema, anxiety/depression, T2DM, peripheral neuropathy, hypothyroidism, GERD, COPD/asthma, chronic respiratory failure 3 L oxygen  baseline, history of anaphylaxis to penicillin.   Clinical Impression  Pt presented supine in bed with HOB elevated, awake and willing to participate in therapy session. Pt's spouse was present throughout evaluation as well. Prior to admission, pt reported that she utilizes either a manual or power wheelchair (w/c) as her primary means of mobility. In the manual w/c, she requires assistance from her husband for propulsion. She also requires assistance from her husband for transfers and ADLs. Pt reported that she does receive HHPT and HHOT (reporting she thinks 2x/week for both). At the time of evaluation, pt able to complete bed mobility with CGA for safety and sat EOB for ~10 minutes with close supervision. Pt declining further mobility at this time, reporting that she has been up to the [facility] with assistance from staff. Pt with 4L of supplemental O2 via [place] throughout with all VSS. Pt would continue to benefit from skilled physical therapy services at this time while admitted and after d/c to address the below listed limitations in order to improve overall safety and independence with functional mobility.       If plan is discharge home, recommend the following: A lot of help with walking and/or transfers;A lot of help with bathing/dressing/bathroom;Assistance with cooking/housework;Help with stairs or ramp for entrance;Assist for transportation   Can travel by private  vehicle        Equipment Recommendations None recommended by PT  Recommendations for Other Services       Functional Status Assessment Patient has had a recent decline in their functional status and demonstrates the ability to make significant improvements in function in a reasonable and predictable amount of time.     Precautions / Restrictions Precautions Precautions: Fall Restrictions Weight Bearing Restrictions Per Provider Order: No      Mobility  Bed Mobility Overal bed mobility: Needs Assistance Bed Mobility: Supine to Sit, Sit to Supine     Supine to sit: Contact guard Sit to supine: Contact guard assist   General bed mobility comments: increased time and effort, use of stretcher rail, HOB elevated, CGA for safety and line management    Transfers                   General transfer comment: pt declining transfer attempt at this time, stating that she has been up and using the [facility] today with assistance from staff    Ambulation/Gait               General Gait Details: pt uses a w/c as her primary means of mobility  Stairs            Wheelchair Mobility     Tilt Bed    Modified Rankin (Stroke Patients Only)       Balance Overall balance assessment: Needs assistance Sitting-balance support: Feet supported Sitting balance-Leahy Scale: Fair  Pertinent Vitals/Pain Pain Assessment Pain Assessment: No/denies pain    Home Living Family/patient expects to be discharged to:: Private residence Living Arrangements: Spouse/significant other Available Help at Discharge: Family;Available 24 hours/day Type of Home: House Home Access: Level entry       Home Layout: One level Home Equipment: BSC/3in1;Wheelchair - manual;Wheelchair - power;Hospital bed Additional Comments: 3L of O2 at baseline    Prior Function Prior Level of Function : Needs assist             Mobility  Comments: pt uses a w/c as her primary means of mobility, she has both a manual and a power w/c; pt requires assistance from her husband for transfers ADLs Comments: requires assistance from her husband     Extremity/Trunk Assessment   Upper Extremity Assessment Upper Extremity Assessment: Generalized weakness    Lower Extremity Assessment Lower Extremity Assessment: Generalized weakness       Communication   Communication Communication: No apparent difficulties  Cognition Arousal: Alert Behavior During Therapy: Restless Overall Cognitive Status: Within Functional Limits for tasks assessed                                 General Comments: pt very chatty; cognition not formally assessed but [facility] for general conversation        General Comments      Exercises     Assessment/Plan    PT Assessment Patient needs continued PT services  PT Problem List Decreased strength;Decreased range of motion;Decreased activity tolerance;Decreased balance;Decreased mobility;Decreased coordination;Decreased knowledge of use of DME;Decreased safety awareness       PT Treatment Interventions DME instruction;Gait training;Stair training;Functional mobility training;Therapeutic activities;Therapeutic exercise;Balance training;Neuromuscular re-education;Patient/family education    PT Goals (Current goals can be found in the Care Plan section)  Acute Rehab PT Goals Patient Stated Goal: to feel better PT Goal Formulation: With patient/family Time For Goal Achievement: 05/16/23 Potential to Achieve Goals: Good    Frequency Min 1X/week     Co-evaluation               AM-PAC PT 6 Clicks Mobility  Outcome Measure Help needed turning from your back to your side while in a flat bed without using bedrails?: None Help needed moving from lying on your back to sitting on the side of a flat bed without using bedrails?: None Help needed moving to and from a bed to a chair  (including a wheelchair)?: A Little Help needed standing up from a chair using your arms (e.g., wheelchair or bedside chair)?: A Little Help needed to walk in hospital room?: A Lot Help needed climbing 3-5 steps with a railing? : Total 6 Click Score: 17    End of Session Equipment Utilized During Treatment: Oxygen  Activity Tolerance: Patient limited by fatigue Patient left: in bed;with call bell/phone within reach;with family/visitor present;Other (comment) (in ED on stretcher) Nurse Communication: Mobility status PT Visit Diagnosis: Other abnormalities of gait and mobility (R26.89)    Time: 8876-8854 PT Time Calculation (min) (ACUTE ONLY): 22 min   Charges:   PT Evaluation $PT Eval Moderate Complexity: 1 Mod   PT General Charges $$ ACUTE PT VISIT: 1 Visit         Delon DELENA KLEIN, DPT  Acute Rehabilitation Services Office [phone]   Delon HERO Hermela Hardt 05/02/2023, 1:51 PM

## 2023-05-02 NOTE — TOC Initial Note (Signed)
 Transition of Care [facility]) - Initial/Assessment Note    Patient Details  Name: Cheryl Chandler MRN: [id number] Date of Birth: 13-Aug-1965  Transition of Care [facility] CM/SW Contact:    Leita CHRISTELLA Carlota Liane, LCSWA Phone Number: 05/02/2023, 3:56 PM  Clinical Narrative:                      CSW left voicemail for spouse for [facility] reccs.   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   Gastroenteritis due to norovirus 05/01/2023   Hypothyroidism 05/01/2023   T2DM (type 2 diabetes mellitus) (HCC) 05/01/2023   Dermatitis 02/02/2023   Cellulitis 02/01/2023   Venous ulcer of ankle, left (HCC) 11/16/2022   Moderate episode of recurrent major depressive disorder (HCC) 08/03/2022   Controlled type 2 diabetes mellitus with diabetic polyneuropathy, without long-term current use of insulin  (HCC) 08/03/2022   Venous ulcer (HCC) 05/03/2022   Foot pain, right 09/01/2021   Fibrocystic disease of both breasts 05/07/2019   Chronic venous insufficiency 11/09/2018   Overactive bladder 09/30/2018   Peripheral neuropathy 09/30/2018   Apnea 01/02/2016   Lymphedema 11/30/2014   Dyspnea on exertion 11/30/2014   Anxiety 11/29/2014   Arthritis 11/29/2014   COPD (chronic obstructive pulmonary disease) (HCC) 11/29/2014   Clinical depression 11/29/2014   H/O eating disorder 11/29/2014   Esophageal reflux 11/29/2014   Acne inversa 11/29/2014   Essential hypertension 11/29/2014   HLD (hyperlipidemia) 11/29/2014   Adult  hypothyroidism 11/29/2014   Insomnia 11/29/2014   Low back pain 11/29/2014   Morbid obesity (HCC) 11/29/2014   Posttraumatic stress disorder 11/29/2014   Vitamin D  deficiency 11/29/2014   Nicotine dependence, cigarettes, uncomplicated 11/29/2014   PCP:  [name], NP Pharmacy:   [facility] [place], [place] - [address] [place] [address] Phone: [phone] Fax: [phone]     Social Drivers of Health (SDOH) Social History: SDOH Screenings   Food Insecurity: Food Insecurity Present (02/02/2023)  Housing: Low Risk  (02/02/2023)  Recent Concern: Housing - Medium Risk (01/21/2023)  Transportation Needs: No Transportation Needs (02/02/2023)  Utilities: Not At Risk (04/01/2023)  Alcohol Screen: Low Risk  (04/02/2023)  Depression (PHQ2-9): High Risk (03/08/2023)  Financial Resource Strain: High Risk (03/02/2023)  Physical Activity: Inactive (03/02/2023)  Social Connections: Moderately Integrated (04/02/2023)  Stress: Stress Concern Present (03/23/2023)  Tobacco Use: High Risk (05/01/2023)  Health Literacy: Adequate Health Literacy (04/01/2023)   [name]:     Readmission Risk Interventions     No data to display

## 2023-05-03 DIAGNOSIS — K529 Noninfective gastroenteritis and colitis, unspecified: Secondary | ICD-10-CM | POA: Diagnosis not present

## 2023-05-03 DIAGNOSIS — N9489 Other specified conditions associated with female genital organs and menstrual cycle: Secondary | ICD-10-CM | POA: Diagnosis present

## 2023-05-03 LAB — BASIC METABOLIC PANEL
Anion gap: 7 (ref 5–15)
BUN: 13 mg/dL (ref 6–20)
CO2: 25 mmol/L (ref 22–32)
Calcium: 7.9 mg/dL — ABNORMAL LOW (ref 8.9–10.3)
Chloride: 104 mmol/L (ref 98–111)
Creatinine, Ser: 0.88 mg/dL (ref 0.44–1.00)
GFR, Estimated: >60 mL/min (ref >60)
Glucose, Bld: 98 mg/dL (ref 70–99)
Potassium: 3.6 mmol/L (ref 3.5–5.1)
Sodium: 136 mmol/L (ref 135–145)

## 2023-05-03 LAB — CBC
HCT: 31.1 % — ABNORMAL LOW (ref 36.0–46.0)
Hemoglobin: 10.1 g/dL — ABNORMAL LOW (ref 12.0–15.0)
MCH: 28.6 pg (ref 26.0–34.0)
MCHC: 32.5 g/dL (ref 30.0–36.0)
MCV: 88.1 fL (ref 80.0–100.0)
Platelets: 286 10*3/uL (ref 150–400)
RBC: 3.53 MIL/uL — ABNORMAL LOW (ref 3.87–5.11)
RDW: 16.1 % — ABNORMAL HIGH (ref 11.5–15.5)
WBC: 5.5 10*3/uL (ref 4.0–10.5)
nRBC: 0 % (ref 0.0–0.2)

## 2023-05-03 LAB — CBG MONITORING, ED
Glucose-Capillary: 94 mg/dL (ref 70–99)
Glucose-Capillary: 87 mg/dL (ref 70–99)

## 2023-05-03 LAB — CA 125: Cancer Antigen (CA) 125: 6.3 U/mL (ref 0.0–38.1)

## 2023-05-03 LAB — HEMOGLOBIN A1C
Hgb A1c MFr Bld: 5.8 % — ABNORMAL HIGH (ref 4.8–5.6)
Mean Plasma Glucose: 120 mg/dL

## 2023-05-03 MED ORDER — LOPERAMIDE HCL 2 MG PO TABS: 2.0000 mg | ORAL_TABLET | Freq: Four times a day (QID) | ORAL | 0 refills | Status: DC | PRN

## 2023-05-03 MED ORDER — ONDANSETRON 4 MG PO TBDP
4.0000 mg | ORAL_TABLET | Freq: Four times a day (QID) | ORAL | Status: DC | PRN
  Administered 2023-05-03: 4 mg via ORAL
  Filled 2023-05-03: qty 1

## 2023-05-03 MED ORDER — ONDANSETRON 4 MG PO TBDP: 4.0000 mg | ORAL_TABLET | Freq: Four times a day (QID) | ORAL | 0 refills | Status: DC | PRN

## 2023-05-03 NOTE — ED Notes (Signed)
 Pt assisted to bedside commode, +urine. Endorses full body pain, medicated PRN.

## 2023-05-03 NOTE — ED Notes (Signed)
Hand off given

## 2023-05-03 NOTE — ED Notes (Signed)
 Hospital bed placed in room for patient for comfort. New linens and blankets given. Pending inpt bed.

## 2023-05-03 NOTE — Discharge Summary (Signed)
 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   Gastroenteritis due to norovirus   Hypothyroidism   T2DM (type 2 diabetes mellitus) (HCC)   Norovirus   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  At baseline patient wheelchair-bound.  Since early this morning she has developed significant nausea, vomiting and diarrhea.  It has been intractable.  She additionally complains of abdominal pains.  She is cold but denies fevers or chills.  She denies any recent travel.  She denies any sick contacts.  She denies any shortness of breath or chest pains.   In the ER vitals stable.  Labs stool culture positive for norovirus and febrile Cholera.  WBCs 16.5.  Abdominal images CT abdomen pelvis/MRI pelvis, pelvic ultrasound shows complex ovarian cyst. Oncologist on-call,/midwife [facility] contacted  Hospital Course:  Patient presents with  nausea/vomiting, and diarrhea. No AKI or significant electrolyte disturbance. Stool testing positive for norovirus and vibrio cholera. Treated with azithromycin  x1, fluids, and anti-emetics. Symptoms much improved, tolerating PO and urinating normally. No additional ID f/u advised by ID, advise continue supportive care and infection control measures. Discharged home with instructions to push fluids and with anti-emetic. Adnexal mass incidentally discovered on CT, mri obtained which shows complex ovarian cystic lesions 11.4 on right and 5.5 cm on left. Gyn consulted, tumor markers ordered, gyn advises outpatient f/u. Other chronic conditions stable  Procedures: none   Consultations: Ob/gyn  Discharge Exam: Vitals:   05/03/23 1030 05/03/23 1122  BP: 132/60 (!) 120/53  Pulse: 64 62  Resp: 18 16  Temp:  98.9 F (37.2 C)  SpO2:  100%    General: NAD Cardiovascular: RRR Respiratory: CTAB Abdomen: obese, soft, diffuse mild tenderness  Discharge Instructions   Discharge Instructions     Diet - low sodium heart healthy   Complete by: As directed    Increase activity slowly   Complete by: As directed       Allergies as of 05/03/2023       Reactions   Codeine Shortness Of Breath, Rash   Percocet [oxycodone-acetaminophen ] Shortness Of Breath   Sulfa Antibiotics Shortness Of Breath, Rash   Aspirin Hives   Bee Venom    Bee stings   Darvon [propoxyphene]    Darvocet-N 100   Latex    Oxycodone Nausea And Vomiting   Penicillins    Has patient had a PCN reaction causing immediate rash, facial/tongue/throat swelling, SOB or lightheadedness with hypotension: Unknown Has patient had a PCN reaction causing severe rash involving mucus membranes or skin  necrosis: Unknown Has patient had a PCN reaction that required hospitalization: Unknown Has patient had a PCN reaction occurring within the last 10 years: Unknown If all of the above answers are NO, then may proceed with Cephalosporin  use.        Medication List     STOP taking these medications    amLODipine  5 MG tablet Commonly known as: NORVASC    furosemide  20 MG tablet Commonly known as: LASIX    lisinopril  40 MG tablet Commonly known as: ZESTRIL    spironolactone  25 MG tablet Commonly known as: ALDACTONE        TAKE these medications    Accu-Chek Guide test strip Generic drug: glucose blood USE TO CHECK BLOOD SUGAR 3 TIMES DAILY AS DIRECTED   Accu-Chek Guide w/Device Kit Use to check blood sugar 3 times a day.   Accu-Chek Softclix Lancets lancets CHECK BLOOD SUGAR TWICE DAILY   albuterol  (2.5 MG/3ML) 0.083% nebulizer solution Commonly known as: PROVENTIL  USE 1 VIAL VIA NEBULIZER EVERY 4 HOURS AS NEEDED   Aquacel Ag Foam 8X8 Pads Apply to areas of ulceration on legs as directed   atorvastatin  80 MG tablet Commonly known as: LIPITOR Take 1 tablet (80 mg total) by mouth daily.   citalopram  40 MG tablet Commonly known as: CELEXA  Take 1 tablet (40 mg total) by mouth daily.   diphenhydrAMINE 25 mg capsule Commonly known as: BENADRYL Take 25 mg by mouth every 6 (six) hours as needed for itching. Taking once a day   docusate sodium  100 MG capsule Commonly known as: COLACE TAKE 1 CAPSULE BY MOUTH TWICE DAILY   EPINEPHrine  0.3 mg/0.3 mL Soaj injection Commonly known as: EPI-PEN INJECT 1 SYRINGE INTO OUTER THIGH ONCE AS NEEDED FOR SEVERE ALLERGIC REACTION.   gabapentin  300 MG capsule Commonly known as: NEURONTIN  TAKE 1 CAPSULE BY MOUTH 3 TIMES DAILY ASNEEDED (NEED OFFICE VISIT FOR FURTHER REFILLS) What changed: See the new instructions.   hydrALAZINE  50 MG tablet Commonly known as: APRESOLINE  Take 50 mg by mouth 3 (three) times daily.   hydrOXYzine  25 MG capsule Commonly known as: VISTARIL  Take 1 capsule (25 mg total) by mouth every 8 (eight) hours as needed.   levothyroxine  50 MCG tablet Commonly known as: SYNTHROID  TAKE 1 TABLET BY MOUTH ONCE DAILY ON AN EMPTY STOMACH. WAIT  30 MINUTES BEFORE TAKING OTHER MEDS.   loperamide  2 MG tablet Commonly known as: IMODIUM  A-D Take 1 tablet (2 mg total) by mouth 4 (four) times daily as needed for diarrhea or loose stools.   meloxicam  15 MG tablet Commonly known as: MOBIC  TAKE 1 TABLET BY MOUTH ONCE DAILY AS NEEDED WITH FOOD OR MILK   montelukast  10 MG tablet Commonly known as: SINGULAIR  TAKE 1 TABLET BY MOUTH AT BEDTIME   nitroGLYCERIN  0.4 MG SL tablet Commonly known as: NITROSTAT  DISSOLVE 1 TABLET UNDER TONGUE AT ONSET OF CHEST PAIN. REPEAT IN 5 MIN IF NOT RESOLVED, MAX 3 DOSES. 911 IF NEEDED.   omeprazole  20 MG capsule Commonly known as: PRILOSEC TAKE 1 CAPSULE BY MOUTH ONCE DAILY   Omron 3 Series BP Monitor Devi Use to check blood pressure as directed   ondansetron  4 MG disintegrating tablet Commonly known as: ZOFRAN -ODT Take 1 tablet (4 mg total) by mouth every 6 (six) hours as needed for nausea or vomiting.   OXYGEN  Inhale 3 L into the lungs daily.   Trelegy Ellipta  100-62.5-25 MCG/ACT Aepb Generic drug: Fluticasone -Umeclidin-Vilant Inhale 1 puff into the lungs daily.   Trulicity  0.75 MG/0.5ML  Soaj Generic drug: Dulaglutide  0.75 mg by ultrasound guided injection route once a week.   Vashe Wound 0.033 % Soln Apply 1 Application topically daily. Use as directed to cleanse lower legs skin   Vitamin D  (Ergocalciferol ) 1.25 MG (50000 UNIT) Caps capsule Commonly known as: DRISDOL  TAKE 1 CAPSULE BY MOUTH EVERY 7 DAYS       Allergies  Allergen Reactions   Codeine Shortness Of Breath and Rash   Percocet [Oxycodone-Acetaminophen ] Shortness Of Breath   Sulfa Antibiotics Shortness Of Breath and Rash   Aspirin Hives   Bee Venom     Bee stings   Darvon [Propoxyphene]     Darvocet-N 100   Latex    Oxycodone Nausea And Vomiting   Penicillins     Has patient had a PCN reaction causing immediate rash, facial/tongue/throat swelling, SOB or lightheadedness with hypotension: Unknown Has patient had a  PCN reaction causing severe rash involving mucus membranes or skin necrosis: Unknown Has patient had a PCN reaction that required hospitalization: Unknown Has patient had a PCN reaction occurring within the last 10 years: Unknown If all of the above answers are NO, then may proceed with Cephalosporin use.    Follow-up Information     Schermerhorn, Debby PARAS, MD. Schedule an appointment as soon as possible for a visit in 1 week(s).   Specialty: Obstetrics and Gynecology Why: for outpatient follow up and further evaluation of ovarian cyst Contact information: [address] [place] [address] [phone]         [name], NP Follow up.   Specialty: Nurse Practitioner Contact information: [address] [place] [address] [phone]                  The results of significant diagnostics from this hospitalization (including imaging, microbiology, ancillary and laboratory) are listed below for reference.    Significant Diagnostic Studies: MR PELVIS WO CONTRAST Result Date: 05/01/2023 CLINICAL DATA:  Ovarian cystic lesion. EXAM: MRI PELVIS WITHOUT CONTRAST TECHNIQUE: Multiplanar multisequence MR imaging of the pelvis was performed. No intravenous contrast was administered. COMPARISON:  CT and ultrasound 05/01/2023. FINDINGS: Limited examination. Only 4 sequences were obtained and the entirety of the pelvis is not included on these images. Images are more centered over the sacrum. Of note as per the MRI technologist, the patient had difficulty tolerating the procedure. A complete study could not be obtained at this time. Urinary Tract: Grossly preserved contour. Bowel: Fluid along the rectosigmoid colon. The visualized bowel in the pelvis is nondilated. Vascular/Lymphatic: Limited visualization of the vasculature and lymph nodes in the pelvis. No obvious nodal enlargement., normal caliber central iliac vessels with flow voids.  Study is without contrast. Reproductive: Uterus: Measures uterus measures 8.1 x 3.9 by 5.8 cm. Preserved myometrium and junctional zone. Endometrial stripe measures 5 mm. Right ovary: The right ovary is incompletely included in the imaging field due to the field-of-view. There is large cystic area in the midline of the pelvis which very well could relate to the patient's right ovary with a septation measuring at least 11.4 cm in size. What is seen is without obvious soft tissue component but again the lesion is not completely included in the imaging field and is without contrast. Left ovary: Left ovary measures 5.8 by 3.4 by 6.0 cm. Associated with the left ovary is a minimally complex cystic lesion with a few peripheral thin septations. This measures 4.8 by 5.5 by 2.6 cm. No contrast was administered  assess for abnormal enhancement. Other: No significant free fluid in the pelvis. Musculoskeletal:  Unremarkable.  Limited IMPRESSION: Extremely limited examination as the patient had difficulty tolerating the procedure. The entirety of the pelvis is not imaged nor are the female pelvic structures. There appear to be mildly complex ovarian cystic lesions measuring up to 11.4 cm on the right and 5.5 cm on the left. No free fluid in the pelvis. There is a differential. A cystic neoplasm of the ovary is possible. Based on size would recommend further evaluation. This is concerning for low-grade cystic neoplasm. GYN consult is recommended. Note: This recommendation does not apply to premenarchal patients and to those with increased risk (genetic, family history, elevated tumor markers or other high-risk factors) of ovarian cancer. Reference: JACR 2020 Feb; 17(2):248-254 Electronically Signed   By: Ranell Bring M.D.   On: 05/01/2023 19:21   US  PELVIS (TRANSABDOMINAL ONLY) Result Date: 05/01/2023 CLINICAL DATA:  Bilateral adnexal cysts seen on CT. Patient reports abdominal pain. Transvaginal exam was declined by the  patient. EXAM: TRANSABDOMINAL ULTRASOUND OF PELVIS TECHNIQUE: Transabdominal ultrasound examination of the pelvis was performed including evaluation of the uterus, ovaries, adnexal regions, and pelvic cul-de-sac. COMPARISON:  CT abdomen and pelvis performed earlier today FINDINGS: Uterus Not visualized. Right ovary Not visualized. Left ovary Not visualized. Other findings: A cyst with a 3 mm septation in the midline measures 10.0 x 6.3 x 11.0 cm. This corresponds to the finding described on same day CT abdomen and pelvis, however is difficult to determine if this arises from either adnexa. IMPRESSION: Septated cyst in the midline corresponds to the finding described on same day CT abdomen and pelvis, however it is difficult to determine if this arises from either adnexa. Electronically Signed   By: Norman Hopper M.D.   On: 05/01/2023 15:53   CT ABDOMEN PELVIS W CONTRAST Result Date: 05/01/2023 CLINICAL DATA:  Abdominal pain.  Nausea vomiting and diarrhea EXAM: CT ABDOMEN AND PELVIS WITH CONTRAST TECHNIQUE: Multidetector CT imaging of the abdomen and pelvis was performed using the standard protocol following bolus administration of intravenous contrast. RADIATION DOSE REDUCTION: This exam was performed according to the departmental dose-optimization program which includes automated exposure control, adjustment of the mA and/or kV according to patient size and/or use of iterative reconstruction technique. CONTRAST:  OMNIPAQUE  IOHEXOL  300 MG/ML  SOLN COMPARISON:  08/05/2014 FINDINGS: Lower chest: No acute abnormality. Hepatobiliary: No focal liver abnormality is seen. No gallstones, gallbladder wall thickening, or biliary dilatation. Pancreas: Unremarkable. No pancreatic ductal dilatation or surrounding inflammatory changes. Spleen: Normal in size without focal abnormality. Adrenals/Urinary Tract: Right adrenal gland is normal. 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  Increased from 1.8 by 1.8 cm previously. No nephrolithiasis, hydronephrosis or suspicious mass. Urinary bladder appears unremarkable. Stomach/Bowel: Stomach appears normal and nondistended. No pathologic dilatation of the large or small bowel loops. Liquid stool identified throughout the colon. No bowel wall thickening or inflammatory change identified. The appendix is surgically absent. Vascular/Lymphatic: Patent upper abdominal vascularity. Aortic atherosclerotic calcifications. No aneurysm. Prominent bilateral iliac lymph nodes are identified. The left common iliac node measures 1 cm, image 57/2. There is a left external iliac node which measures 1.3 cm, image 73/2. Left inguinal lymph node measures 1.5 cm, image 93/2. Reproductive: The uterus appears normal. There is a simple appearing cyst with in the left ovary measuring 5.7 x 3.6 cm, image 67/2. Previously 3.7 by 2.3 cm. Ovary cystic structure within  the lower abdomen containing thin internal area of septation is felt to arise off the right adnexa measuring 10.7 by 6.6 cm, image 46/5. On the previous exam there was a right ovary cyst measuring 5.3 x 3.1 cm. Other: No ascites identified. No signs of peritoneal nodularity. Laxity of the ventral abdominal wall is identified. Small fat containing umbilical hernia. Musculoskeletal: No acute or suspicious osseous findings. Signs of previous instrumentation within bilateral femurs. IMPRESSION: 1. Liquid stool identified throughout the colon compatible with diarrheal illness. No bowel wall thickening or inflammatory change identified. 2. Bilocular cyst is felt to arise from the right ovary and is displaced into the central portion of the lower abdomen with a maximum dimension of 10.7 cm. This is concerning for low-grade cystic neoplasm. Recommend GYN consult and consider pelvis MRI w/o and w/ contrast if clinically warranted. Note: This recommendation does not apply to premenarchal patients and to those with  increased risk (genetic, family history, elevated tumor markers or other high-risk factors) of ovarian cancer. Reference: JACR 2020 Feb; 17(2):248-254 3. Left ovary cyst measures 5.7 cm and is increased from 3.7 cm previously. 4. Prominent bilateral iliac and left inguinal lymph nodes are identified. These are nonspecific. 5. 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 Increased from 1.8 by 1.8 cm previously. 6.  Aortic Atherosclerosis (ICD10-I70.0). Electronically Signed   By: Waddell Calk M.D.   On: 05/01/2023 12:14    Microbiology: Recent Results (from the past 240 hours)  Gastrointestinal Panel by PCR , Stool     Status: Abnormal   Collection Time: 05/01/23  1:56 PM   Specimen: Stool  Result Value Ref Range Status   Campylobacter species NOT DETECTED NOT DETECTED Final   Plesimonas shigelloides NOT DETECTED NOT DETECTED Final   Salmonella species NOT DETECTED NOT DETECTED Final   Yersinia enterocolitica NOT DETECTED NOT DETECTED Final   Vibrio species DETECTED (A) NOT DETECTED Final    Comment: RESULT CALLED TO, READ BACK BY AND VERIFIED WITH: ROSINA COOLER AT 1542 05/01/23.PMF    Vibrio cholerae DETECTED (A) NOT DETECTED Final    Comment: RESULT CALLED TO, READ BACK BY AND VERIFIED WITH: ROSINA COOLER AT 1542 05/01/23.PMF    Enteroaggregative E coli (EAEC) NOT DETECTED NOT DETECTED Final   Enteropathogenic E coli (EPEC) NOT DETECTED NOT DETECTED Final   Enterotoxigenic E coli (ETEC) NOT DETECTED NOT DETECTED Final   Shiga like toxin producing E coli (STEC) NOT DETECTED NOT DETECTED Final   Shigella/Enteroinvasive E coli (EIEC) NOT DETECTED NOT DETECTED Final   Cryptosporidium NOT DETECTED NOT DETECTED Final   Cyclospora cayetanensis NOT DETECTED NOT DETECTED Final   Entamoeba histolytica NOT DETECTED NOT DETECTED Final   Giardia lamblia NOT DETECTED NOT DETECTED Final   Adenovirus F40/41 NOT DETECTED NOT DETECTED Final   Astrovirus NOT DETECTED NOT DETECTED Final    Norovirus GI/GII DETECTED (A) NOT DETECTED Final    Comment: RESULT CALLED TO, READ BACK BY AND VERIFIED WITH: ROSINA COOLER AT 1542 05/01/23.PMF    Rotavirus A NOT DETECTED NOT DETECTED Final   Sapovirus (I, II, IV, and V) NOT DETECTED NOT DETECTED Final    Comment: Performed at [facility], [address] [place], [place] [address]     Labs: Basic Metabolic Panel: Recent Labs  Lab 05/01/23 0510 05/01/23 1356 05/02/23 0446 05/03/23 0405  NA 140  --  137 136  K 3.8  --  3.4* 3.6  CL 104  --  104 104  CO2  19*  --  20* 25  GLUCOSE 144*  --  112* 98  BUN 20  --  13 13  CREATININE 0.88  --  0.80 0.88  CALCIUM  9.3  --  8.0* 7.9*  MG  --  2.4 2.0  --    Liver Function Tests: Recent Labs  Lab 05/01/23 0510  AST 26  ALT 27  ALKPHOS 101  BILITOT 0.5  PROT 8.4*  ALBUMIN 3.6   Recent Labs  Lab 05/01/23 0510  LIPASE 23   No results for input(s): AMMONIA in the last 168 hours. CBC: Recent Labs  Lab 05/01/23 0510 05/02/23 0446 05/03/23 0405  WBC 16.5* 7.0 5.5  NEUTROABS  --  5.7  --   HGB 13.5 10.9* 10.1*  HCT 42.7 33.8* 31.1*  MCV 89.1 86.2 88.1  PLT 413* 328 286   Cardiac Enzymes: No results for input(s): CKTOTAL, CKMB, CKMBINDEX, TROPONINI in the last 168 hours. BNP: BNP (last 3 results) No results for input(s): BNP in the last 8760 hours.  ProBNP (last 3 results) No results for input(s): PROBNP in the last 8760 hours.  CBG: Recent Labs  Lab 05/02/23 1157 05/02/23 1629 05/02/23 2238 05/03/23 0859 05/03/23 1120  GLUCAP 98 73 87 94 87       Signed:  Devaughn KATHEE Ban MD.  [facility] 05/03/2023, 11:36 AM

## 2023-05-04 ENCOUNTER — Other Ambulatory Visit: Payer: Self-pay | Admitting: Obstetrics and Gynecology

## 2023-05-04 ENCOUNTER — Other Ambulatory Visit: Payer: Self-pay | Admitting: Licensed Clinical Social Worker

## 2023-05-04 LAB — CA 19-9 (SERIAL): CA 19-9: 5 U/mL (ref 0–35)

## 2023-05-04 NOTE — Telephone Encounter (Signed)
 Requested Prescriptions  Pending Prescriptions Disp Refills   gabapentin  (NEURONTIN ) 300 MG capsule [Pharmacy Med Name: GABAPENTIN  300 MG CAP] 270 capsule 3    Sig: TAKE 1 CAPSULE BY MOUTH 3 TIMES DAILY ASNEEDED (NEED OFFICE VISIT FOR FURTHER REFILLS)     Neurology: Anticonvulsants - gabapentin  Passed - 05/04/2023  9:39 AM      Passed - Cr in normal range and within 360 days    Creatinine  Date Value Ref Range Status  08/05/2014 0.86 mg/dL Final    Comment:    9.55-8.99 NOTE: New Reference Range  07/03/14    Creatinine, Ser  Date Value Ref Range Status  05/03/2023 0.88 0.44 - 1.00 mg/dL Final         Passed - Completed PHQ-2 or PHQ-9 in the last 360 days      Passed - Valid encounter within last 12 months    Recent Outpatient Visits           1 month ago Chronic obstructive pulmonary disease, unspecified COPD type (HCC)   [place] [facility] [name], NP   2 months ago Chronic obstructive pulmonary disease, unspecified COPD type (HCC)   [place] [facility] [name], NP   5 months ago Controlled type 2 diabetes mellitus with diabetic polyneuropathy, without long-term current use of insulin  (HCC)   [place] [facility] Mecum, Rocky BRAVO, PA-C   9 months ago Essential hypertension   [place] [facility] [name], NP   1 year ago Morbid obesity [facility])   [place] [facility] [name], NP       Future Appointments             In 9 months Jackquline Sawyer, MD [facility] [facility]             lisinopril  (ZESTRIL ) 40 MG tablet [Pharmacy Med Name: LISINOPRIL  40 MG TAB] 90 tablet 0    Sig: TAKE 1 TABLET BY MOUTH ONCE DAILY     Cardiovascular:  ACE Inhibitors Passed - 05/04/2023  9:39 AM      Passed - Cr in normal range and within 180 days    Creatinine  Date Value Ref Range Status  08/05/2014 0.86 mg/dL Final    Comment:    9.55-8.99 NOTE: New  Reference Range  07/03/14    Creatinine, Ser  Date Value Ref Range Status  05/03/2023 0.88 0.44 - 1.00 mg/dL Final         Passed - K in normal range and within 180 days    Potassium  Date Value Ref Range Status  05/03/2023 3.6 3.5 - 5.1 mmol/L Final  08/05/2014 4.5 mmol/L Final    Comment:    3.5-5.1 NOTE: New Reference Range  07/03/14          Passed - Patient is not pregnant      Passed - Last BP in normal range    BP Readings from Last 1 Encounters:  05/03/23 (!) 120/57         Passed - Valid encounter within last 6 months    Recent Outpatient Visits           1 month ago Chronic obstructive pulmonary disease, unspecified COPD type (HCC)   [place] [facility] [name], NP   2 months ago Chronic obstructive pulmonary disease, unspecified COPD type [facility])    [facility] [name], NP   5 months  ago Controlled type 2 diabetes mellitus with diabetic polyneuropathy, without long-term current use of insulin  (HCC)   [place] [facility] Mecum, Rocky BRAVO, PA-C   9 months ago Essential hypertension   [place] [facility] [name], NP   1 year ago Morbid obesity [facility])   [place] [facility] [name], NP       Future Appointments             In 9 months Jackquline Sawyer, MD [facility]

## 2023-05-04 NOTE — Patient Outreach (Signed)
 Medicaid Managed Care   Nurse Care Manager Note  05/04/2023 Name:  Cheryl Chandler MRN:  [id number] DOB:  07-13-1965  Cheryl Chandler is an 58 y.o. year old female who is a primary patient of [name], NP.  The [facility] Managed Care Coordination team was consulted for assistance with:    Chronic healthcare management needs, OSA, GERD, arthritis, HLD, asthma, HTN, DM, COPD, anxiety/depression, lymphedema, neuropathy, obesity  Cheryl Chandler was given information about Medicaid Managed Care Coordination team services today. Cheryl Chandler Patient agreed to services and verbal consent obtained.  Engaged with patient by telephone for follow up visit in response to provider referral for case management and/or care coordination services.   Patient is participating in a Managed Medicaid Plan:  Yes  Assessments/Interventions:  Review of past medical history, allergies, medications, health status, including review of consultants reports, laboratory and other test data, was performed as part of comprehensive evaluation and provision of chronic care management services.  SDOH (Social Drivers of Health) assessments and interventions performed: [name]    Flowsheet Row Patient Outreach Telephone from 05/04/2023 in [place] POPULATION HEALTH DEPARTMENT Most recent reading at 05/04/2023  9:18 AM Patient Outreach Telephone from 04/02/2023 in [place] POPULATION HEALTH DEPARTMENT Most recent reading at 04/02/2023 12:54 PM Patient Outreach Telephone from 04/01/2023 in [place] POPULATION HEALTH DEPARTMENT Most recent reading at 04/01/2023  2:44 PM Patient Outreach Telephone from 03/23/2023 in [place] POPULATION HEALTH DEPARTMENT Most recent reading at 03/23/2023 11:23 AM Patient Outreach Telephone from 03/08/2023 in  POPULATION HEALTH DEPARTMENT Most recent reading at 03/08/2023 12:23 PM Office Visit from 03/08/2023 in [facility] Most recent reading at 03/08/2023  8:20 AM  [name]        Housing Interventions Intervention Not Indicated -- -- -- -- --  Transportation Interventions Intervention Not Indicated -- -- -- -- --  Utilities Interventions -- -- Intervention Not Indicated -- -- --  Alcohol Usage Interventions -- Intervention Not Indicated (Score <7) -- -- Intervention Not Indicated (Score <7) --  Depression Interventions/Treatment  -- -- -- -- -- Medication, Currently on Treatment  Stress Interventions -- -- -- [employer], Provide Counseling  [Aunt recently passed] -- --  Social Connections Interventions -- Intervention Not Indicated -- -- Intervention Not Indicated --  Health Literacy Interventions -- -- Intervention Not Indicated -- -- --     Care Plan Allergies  Allergen Reactions   Codeine Shortness Of Breath and Rash   Percocet [Oxycodone-Acetaminophen ] Shortness Of Breath   Sulfa Antibiotics Shortness Of Breath and Rash   Aspirin Hives   Bee Venom     Bee stings   Darvon [Propoxyphene]     Darvocet-N 100   Latex    Oxycodone Nausea And Vomiting   Penicillins     Has patient had a PCN reaction causing immediate rash, facial/tongue/throat swelling, SOB or lightheadedness with hypotension: Unknown Has patient had a PCN reaction causing severe rash involving mucus membranes or skin necrosis: Unknown Has patient had a PCN reaction that required hospitalization: Unknown Has patient had a PCN reaction occurring within the last 10 years: Unknown If all of the above answers are NO, then may proceed with Cephalosporin use.   Medications Reviewed Today     Reviewed by Milon Veva MATSU, RN (Registered Nurse) on 05/04/23 at [phone]  Med List Status: <None>   Medication Order Taking? Sig Documenting Provider Last Dose Status Informant  ACCU-CHEK GUIDE test  strip [id number] No USE TO CHECK BLOOD SUGAR 3 TIMES DAILY AS DIRECTED [name], NP Taking Active  Self, Other  Accu-Chek Softclix Lancets lancets [id number] No CHECK BLOOD SUGAR TWICE DAILY [name], NP Taking Active Self, Other  albuterol  (PROVENTIL ) (2.5 MG/3ML) 0.083% nebulizer solution [id number] No USE 1 VIAL VIA NEBULIZER EVERY 4 HOURS AS NEEDED Mecum, Erin E, PA-C Taking Active Self, Other           Med Note (STEWART, GARY M   Sun May 02, 2023 11:22 AM) Need refills  atorvastatin  (LIPITOR) 80 MG tablet [id number] No Take 1 tablet (80 mg total) by mouth daily. Mecum, Erin E, PA-C 04/30/2023 Active Self, Other  Blood Glucose Monitoring Suppl (ACCU-CHEK GUIDE) w/Device KIT [id number] No Use to check blood sugar 3 times a day. Valerio Moris T, NP Taking Active Self, Other  Blood Pressure Monitoring (OMRON 3 SERIES BP MONITOR) DEVI [id number] No Use to check blood pressure as directed [name], NP Taking Active Self, Other  citalopram  (CELEXA ) 40 MG tablet [id number] No Take 1 tablet (40 mg total) by mouth daily. Mecum, Erin E, PA-C Taking Active Self, Other  diphenhydrAMINE (BENADRYL) 25 mg capsule [id number] No Take 25 mg by mouth every 6 (six) hours as needed for itching. Taking once a day [provider] Taking Active Self, Other  docusate sodium  (COLACE) 100 MG capsule [id number]  TAKE 1 CAPSULE BY MOUTH TWICE DAILY [name], NP  Active   Dulaglutide  (TRULICITY ) 0.75 MG/0.5ML SOPN [id number] No 0.75 mg by ultrasound guided injection route once a week. Mecum, Erin E, PA-C Taking Active Self, Other  EPINEPHrine  0.3 mg/0.3 mL IJ SOAJ injection [id number] No INJECT 1 SYRINGE INTO OUTER THIGH ONCE AS NEEDED FOR SEVERE ALLERGIC REACTION. Mecum, Erin E, PA-C Taking Active Self, Other  Fluticasone -Umeclidin-Vilant (TRELEGY ELLIPTA ) 100-62.5-25 MCG/ACT AEPB [id number] No Inhale 1 puff into the lungs daily. Tamea Dedra CROME, MD 04/30/2023 Active Self, Other  gabapentin  (NEURONTIN ) 300 MG capsule [id number] No TAKE 1 CAPSULE BY MOUTH 3 TIMES DAILY ASNEEDED (NEED OFFICE VISIT FOR  FURTHER REFILLS)  Patient taking differently: Take 300 mg by mouth 3 (three) times daily as needed.   [name], NP Past Week Active Self, Other  hydrALAZINE  (APRESOLINE ) 50 MG tablet [id number] No Take 50 mg by mouth 3 (three) times daily. [provider] 04/30/2023 Active   hydrOXYzine  (VISTARIL ) 25 MG capsule [id number] No Take 1 capsule (25 mg total) by mouth every 8 (eight) hours as needed. Mecum, Erin E, PA-C Taking Active Self, Other  levothyroxine  (SYNTHROID ) 50 MCG tablet [id number] No TAKE 1 TABLET BY MOUTH ONCE DAILY ON AN EMPTY STOMACH. WAIT 30 MINUTES BEFORE TAKING OTHER MEDS. Mecum, Erin E, PA-C Past Week Active Self, Other  loperamide  (IMODIUM  A-D) 2 MG tablet [id number]  Take 1 tablet (2 mg total) by mouth 4 (four) times daily as needed for diarrhea or loose stools. Wouk, Devaughn Sayres, MD  Active   meloxicam  (MOBIC ) 15 MG tablet [id number] No TAKE 1 TABLET BY MOUTH ONCE DAILY AS NEEDED WITH FOOD OR MILK [name], NP Taking Active Self, Other           Med Note ZENA, CHERYL A   Fri Aug 07, 2022  9:13 AM) Taking daily  montelukast  (SINGULAIR ) 10 MG tablet [id number] No TAKE 1 TABLET BY MOUTH AT BEDTIME [name], NP Taking Active Self, Other  nitroGLYCERIN  (NITROSTAT ) 0.4 MG SL tablet [id number] No DISSOLVE 1 TABLET UNDER TONGUE AT ONSET OF CHEST PAIN. REPEAT  IN 5 MIN IF NOT RESOLVED, MAX 3 DOSES. 911 IF NEEDED. [name], NP Taking Active Self, Other  omeprazole  (PRILOSEC) 20 MG capsule [id number] No TAKE 1 CAPSULE BY MOUTH ONCE DAILY [name], NP Taking Active Self, Other  ondansetron  (ZOFRAN -ODT) 4 MG disintegrating tablet [id number]  Take 1 tablet (4 mg total) by mouth every 6 (six) hours as needed for nausea or vomiting. Kandis Devaughn Sayres, MD  Active   OXYGEN  [id number] No Inhale 3 L into the lungs daily. [provider] Taking Active Self, Other  Silver SHILA AG FOAM) 8X8 PADS [id number] No Apply to areas of ulceration on legs  as directed Marsa Edelman, DO Taking Active   Vitamin D , Ergocalciferol , (DRISDOL ) 1.25 MG (50000 UNIT) CAPS capsule [id number] No TAKE 1 CAPSULE BY MOUTH EVERY 7 DAYS [name], NP Taking Active Self, Other  Wound Cleansers (VASHE WOUND) 0.033 % SOLN [id number] No Apply 1 Application topically daily. 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   Gastroenteritis due to norovirus 05/01/2023   Hypothyroidism 05/01/2023   T2DM (type 2 diabetes mellitus) (HCC) 05/01/2023   Dermatitis 02/02/2023   Cellulitis 02/01/2023   Venous ulcer of ankle, left (HCC) 11/16/2022   Moderate episode of recurrent major depressive disorder (HCC) 08/03/2022   Controlled type 2 diabetes mellitus with diabetic polyneuropathy, without long-term current use of insulin  (HCC) 08/03/2022   Venous ulcer (HCC) 05/03/2022   Foot pain, right 09/01/2021   Fibrocystic disease of both breasts 05/07/2019   Chronic venous insufficiency 11/09/2018   Overactive bladder 09/30/2018   Peripheral neuropathy 09/30/2018   Apnea 01/02/2016   Lymphedema 11/30/2014   Dyspnea on exertion 11/30/2014   Anxiety 11/29/2014   Arthritis 11/29/2014   COPD (chronic obstructive pulmonary disease) (HCC) 11/29/2014   Clinical depression 11/29/2014   H/O eating disorder 11/29/2014   Esophageal reflux 11/29/2014   Acne inversa 11/29/2014   Essential hypertension 11/29/2014   HLD (hyperlipidemia) 11/29/2014   Adult hypothyroidism 11/29/2014   Insomnia 11/29/2014   Low back pain 11/29/2014   Morbid obesity (HCC) 11/29/2014   Posttraumatic stress disorder 11/29/2014   Vitamin D  deficiency 11/29/2014   Nicotine dependence, cigarettes, uncomplicated 11/29/2014    Conditions to be addressed/monitored per PCP order:  Chronic healthcare management needs, OSA, GERD, arthritis, HLD, asthma, HTN, DM, COPD, anxiety/depression, lymphedema, neuropathy, obesity  Care Plan : RNCM: COPD (Adult)  Updates made by Milon Veva MATSU, RN since 05/04/2023 12:00 AM     Problem: RNCM: Psychological Adjustment to Diagnosis (COPD)   Priority: Medium  Onset Date: 05/22/2020     Long-Range Goal: RNCM: Adjustment to Disease Achieved   Start Date: 05/22/2020  Expected End Date: 06/02/2023  Recent Progress: On track  Priority: High  Note:   Current Barriers:  Knowledge deficits related to basic understanding of COPD disease process Knowledge deficits related to basic COPD self care/management Knowledge deficit related to basic understanding of how to use inhalers and how inhaled medications work Knowledge deficit related to importance of energy conservation Limited Social Support Unable to independently manage COPD Lacks social connections Does not contact provider office for questions/concerns  03/03/24:  patient on 2L continuous Oxygen .  Has PULM  appt 1/20.  Hospitalized 1-4 to 1-6 for gastroenteritis.  To schedule  f/u with PCP and GYN-has ovarian cyst.  Limited conversation today due to being sick.  Case Manager Clinical Goal(s): patient will report using inhalers as prescribed including rinsing mouth after use patient will be able to verbalize understanding of COPD action plan and when to seek appropriate levels of medical care  patient will engage in lite exercise as tolerated to build/regain stamina and strength and reduce shortness of breath through activity tolerance  patient will verbalize basic understanding of COPD disease process and self care activities   Interventions:  Inter-disciplinary care team collaboration (see longitudinal plan of care) UNABLE to independently: manage COPD Provided patient with basic written and verbal COPD education on self  care/management/and exacerbation prevention Provided patient with COPD action plan and reinforced importance of daily self assessment.  Provided written and verbal instructions on pursed lip breathing and utilized returned demonstration as teach back Provided instruction about proper use of medications used for management of COPD including inhalers Advised patient to self assesses COPD action plan zone and make appointment with provider if in the yellow zone for 48 hours without improvement. Provided patient with education about the role of exercise in the management of COPD Advised patient to engage in light exercise as tolerated 3-5 days a week Provided education about and advised patient to utilize infection prevention strategies to reduce risk of respiratory infection.  Patient Goals/Self-Care Activities:  - decision-making supported - depression screen reviewed - emotional support provided - family involvement promoted - problem-solving facilitated - relaxation techniques promoted - verbalization of feelings encouraged  Follow Up Plan: Telephone follow up appointment with care management team member scheduled.   Follow Up:  Patient agrees to Care Plan and Follow-up.  Plan: The Managed Medicaid care management team will reach out to the patient again over the next 30 business  days. and The  Patient has been provided with contact information for the Managed Medicaid care management team and has been advised to call with any health related questions or concerns.  Date/time of next scheduled RN care management/care coordination outreach:  06/04/23 at 0900

## 2023-05-04 NOTE — Telephone Encounter (Signed)
 Requested medications are due for refill today.  Unsure  Requested medications are on the active medications list.  no  Last refill. 03/08/2023  Future visit scheduled.   no  Notes to clinic.  Medication was d/c's on 05/03/2023 at discharge. Please review for refill.    Requested Prescriptions  Pending Prescriptions Disp Refills   lisinopril  (ZESTRIL ) 40 MG tablet [Pharmacy Med Name: LISINOPRIL  40 MG TAB] 90 tablet 0    Sig: TAKE 1 TABLET BY MOUTH ONCE DAILY     Cardiovascular:  ACE Inhibitors Passed - 05/04/2023  9:39 AM      Passed - Cr in normal range and within 180 days    Creatinine  Date Value Ref Range Status  08/05/2014 0.86 mg/dL Final    Comment:    9.55-8.99 NOTE: New Reference Range  07/03/14    Creatinine, Ser  Date Value Ref Range Status  05/03/2023 0.88 0.44 - 1.00 mg/dL Final         Passed - K in normal range and within 180 days    Potassium  Date Value Ref Range Status  05/03/2023 3.6 3.5 - 5.1 mmol/L Final  08/05/2014 4.5 mmol/L Final    Comment:    3.5-5.1 NOTE: New Reference Range  07/03/14          Passed - Patient is not pregnant      Passed - Last BP in normal range    BP Readings from Last 1 Encounters:  05/03/23 (!) 120/57         Passed - Valid encounter within last 6 months    Recent Outpatient Visits           1 month ago Chronic obstructive pulmonary disease, unspecified COPD type (HCC)   [place] [facility] [name], NP   2 months ago Chronic obstructive pulmonary disease, unspecified COPD type (HCC)   [place] [facility] [name], NP   5 months ago Controlled type 2 diabetes mellitus with diabetic polyneuropathy, without long-term current use of insulin  (HCC)   [place] [facility] Mecum, Rocky BRAVO, PA-C   9 months ago Essential hypertension   [place] [facility] [name], NP   1 year ago Morbid obesity [facility])    [facility] [facility] [name], NP       Future Appointments             In 9 months Jackquline Sawyer, MD [facility]            Signed Prescriptions Disp Refills   gabapentin  (NEURONTIN ) 300 MG capsule 270 capsule 3    Sig: TAKE 1 CAPSULE BY MOUTH 3 TIMES DAILY ASNEEDED (NEED OFFICE VISIT FOR FURTHER REFILLS)     Neurology: Anticonvulsants - gabapentin  Passed - 05/04/2023  9:39 AM      Passed - Cr in normal range and within 360 days    Creatinine  Date Value Ref Range Status  08/05/2014 0.86 mg/dL Final    Comment:    9.55-8.99 NOTE: New Reference Range  07/03/14    Creatinine, Ser  Date Value Ref Range Status  05/03/2023 0.88 0.44 - 1.00 mg/dL Final         Passed - Completed PHQ-2 or PHQ-9 in the last 360 days      Passed - Valid encounter within last 12 months    Recent Outpatient Visits  1 month ago Chronic obstructive pulmonary disease, unspecified COPD type [facility])   [place] [facility] [name], NP   2 months ago Chronic obstructive pulmonary disease, unspecified COPD type [facility])   [place] [facility] [name], NP   5 months ago Controlled type 2 diabetes mellitus with diabetic polyneuropathy, without long-term current use of insulin  (HCC)   [place] [facility] Mecum, Rocky BRAVO, PA-C   9 months ago Essential hypertension   [place] [facility] [name], NP   1 year ago Morbid obesity [facility])    [place] [facility] [name], NP       Future Appointments             In 9 months Jackquline Sawyer, MD [facility]

## 2023-05-04 NOTE — Patient Instructions (Signed)
 Visit Information  Ms. Grose was given information about Medicaid Managed Care team care coordination services as a part of their [facility] Medicaid benefit. Joley Rayya Yagi verbally consented to engagement with the [facility] Managed Care team.   If you are experiencing a medical emergency, please call 911 or report to your local emergency department or urgent care.   If you have a non-emergency medical problem during routine business hours, please contact your provider's office and ask to speak with a nurse.   For questions related to your [facility] health plan, please call: [phone] or go here:[email or web address]  If you would like to schedule transportation through your [facility] plan, please call the following number at least 2 days in advance of your appointment: [phone].   You can also use the MTM portal or MTM mobile app to manage your rides. Reimbursement for transportation is available through [facility]! For the portal, please go to [email or web address]  Call the [facility] Crisis Line at [phone], at any time, 24 hours a day, 7 days a week. If you are in danger or need immediate medical attention call 911.  If you would like help to quit smoking, call 1-800-QUIT-NOW ((959) 317-6786) OR Espaol: 1-855-Djelo-Ya (8-144-664-6430) o para ms informacin haga clic aqu or Text READY to 799-599 to register via text   Following is a copy of your plan of care:  Care Plan : General Social Work (Adult)  Updates made by Merlynn Lyle CROME, LCSW since 05/04/2023 12:00 AM     Problem: Coping Skills (General Plan of Care)      Long-Range Goal: Coping Skills Enhanced   Start Date: 11/07/2020  Expected End Date: 03/07/2022  Recent Progress: On track  Priority: High  Note:   Timeframe:  Long-Range Goal Priority:  High Start Date:       08/26/21                 Expected End Date:  ongoing  Follow up date- 06/07/23 at 11 am   Current Barriers:   Financial constraints Mental Health Concerns  Social Isolation Limited education about mental health support resources that are available to her within the local area* Cognitive Deficits Lacks knowledge of community resource   Clinical Social Work Clinical Goal(s):  Over the next 90 days, client will work with SW to address concerns related to experiencing ongoing symptoms of depression, sadness and grief since the passing of her mother Over the next 90 days, patient will work with LCSW to address needs related to implementing appropriate self-care and depression management.    Managing Loss, Adult People experience loss in many different ways throughout their lives. Events such as moving, changing jobs, and losing friends can create a sense of loss. The loss may be as serious as a major health change, divorce, death of a pet, or death of a loved one. All of these types of loss are likely to create a physical and emotional reaction known as grief. Grief is the result of a major change or an absence of something or someone that you count on. Grief is a normal reaction to loss. A variety of factors can affect your grieving experience, including: The nature of your loss. Your relationship to what or whom you lost. Your understanding of grief and how to manage it. Your support system. How to manage lifestyle changes Keep to your normal routine as much as possible. If you have trouble focusing or doing normal activities, it is acceptable to take some time  away from your normal routine. Spend time with friends and loved ones. Eat a healthy diet, get plenty of sleep, and rest when you feel tired. How to recognize changes  The way that you deal with your grief will affect your ability to function as you normally do. When grieving, you may experience these changes: Numbness, shock, sadness, anxiety, anger, denial, and guilt. Thoughts about death. Unexpected crying. A physical sensation of  emptiness in your stomach. Problems sleeping and eating. Tiredness (fatigue). Loss of interest in normal activities. Dreaming about or imagining seeing the person who died. A need to remember what or whom you lost. Difficulty thinking about anything other than your loss for a period of time. Relief. If you have been expecting the loss for a while, you may feel a sense of relief when it happens. Follow these instructions at home:    Activity Express your feelings in healthy ways, such as: Talking with others about your loss. It may be helpful to find others who have had a similar loss, such as a support group. Writing down your feelings in a journal. Doing physical activities to release stress and emotional energy. Doing creative activities like painting, sculpting, or playing or listening to music. Practicing resilience. This is the ability to recover and adjust after facing challenges. Reading some resources that encourage resilience may help you to learn ways to practice those behaviors. General instructions Be patient with yourself and others. Allow the grieving process to happen, and remember that grieving takes time. It is likely that you may never feel completely done with some grief. You may find a way to move on while still cherishing memories and feelings about your loss. Accepting your loss is a process. It can take months or longer to adjust. Keep all follow-up visits as told by your health care provider. This is important. Where to find support To get support for managing loss: Ask your health care provider for help and recommendations, such as grief counseling or therapy. Think about joining a support group for people who are managing a loss. Where to find more information You can find more information about managing loss from: American Society of Clinical Oncology: www.cancer.net American Psychological Association: dicetournament.ca Contact a health care provider if: Your  grief is extreme and keeps getting worse. You have ongoing grief that does not improve. Your body shows symptoms of grief, such as illness. You feel depressed, anxious, or lonely. Get help right away if: You have thoughts about hurting yourself or others. If you ever feel like you may hurt yourself or others, or have thoughts about taking your own life, get help right away. You can go to your nearest emergency department or call: Your local emergency services (911 in the [place] A suicide crisis helpline, such as the National Suicide Prevention Lifeline at (516)243-2419. This is open 24 hours a day. Summary Grief is the result of a major change or an absence of someone or something that you count on. Grief is a normal reaction to loss. The depth of grief and the period of recovery depend on the type of loss and your ability to adjust to the change and process your feelings. Processing grief requires patience and a willingness to accept your feelings and talk about your loss with people who are supportive. It is important to find resources that work for you and to realize that people experience grief differently. There is not one grieving process that works for everyone in the same way. Be  aware that when grief becomes extreme, it can lead to more severe issues like isolation, depression, anxiety, or suicidal thoughts. Talk with your health care provider if you have any of these issues. This information is not intended to replace advice given to you by your health care provider. Make sure you discuss any questions you have with your health care provider. Document Revised: 06/17/2018 Document Reviewed: 08/27/2016 Elsevier Patient Education  2020 [employer].  Help for Managing Grief   When you are experiencing grief, many resources and support are available to help you. You are not alone.    Grief Share ([email or web address]):    [employer] of [address] [place], [place]      [employer]  [phone] [address] [place], [place]     [employer] [employer] [facility] [address] [place], [place]      [facility] [address] [place], [place]  [phone]   [facility] [address] [place], [place] [phone]   [facility] [address] [place], [place]     [phone]   [facility] [address] [place], [place]     [phone]   If a Hospice or Palliative Care team has been involved in the care of your loved one, you may reach out to your contact there or locally, you may reach out to [facility]:    [facility]   [address] [place], [place] [id number] [phone] Patient was educated on healthy self-care as she admits she has little motivation to walk lately. PCS recommendation was suggested.   Patient Self Care Activities:  Attends all scheduled provider appointments Calls provider office for new concerns or questions  Please see past updates related to this goal by clicking on the Past Updates button in the selected goal   Follow up goal      24- Hour Availability:    [facility]  [address] [place], [place] [phone] Crisis [phone]   [facility] [phone]  [place] [facility]  [phone]    [facility]  [phone] (after hours)   Therapeutic Alternative/Mobile Crisis   [phone]   [place]  National Suicide Hotline  937-373-0445 MERRILYN) OR 988   Call 988 for mental health emergencies   [facility]  [phone]);  [place] and [employer]  [phone]); [place], [place], [place], [place], [place], [place], [place]    [facility] Residents For 24/7 walk-up access to mental health  services for [facility] children (4+), adolescents and adults, please visit the [facility] located at [address] in [place], [place].  *[place] also provides comprehensive outpatient behavioral health services in a variety of locations around the [place].  Connect With Us  [address] [place], [place] [address] HelpLine: (726) 014-1791 or [phone]  Get Directions  Find Help 24/7 By Phone Call our 24-hour HelpLine at (817)415-5695 or [phone] for immediate assistance for mental health and substance abuse issues.  Walk-In Help [place]: [facility] (Ages 4 and Up) [place]: Emergency Dept., [facility] Additional Resources National Hopeline Network: 1-800-SUICIDE The National Suicide Prevention Lifeline: 1-800-273-TALK     The following coping skill education was provided for stress relief and mental health  management: When your car dies or a deadline looms, how do you respond? Long-term, low-grade or acute stress takes a serious toll on your body and mind, so don't ignore feelings of constant tension. Stress is a natural part of life. However, too much stress can harm our health, especially if it continues every day. This is chronic stress and can put you at risk for heart problems like heart disease and depression. Understand what's happening inside your body and learn simple coping skills to combat the negative impacts of everyday stressors.  Types of Stress There are two types of stress: Emotional - types of emotional stress are relationship problems, pressure at work, financial worries, experiencing discrimination or having a major life change. Physical - Examples of physical stress include being sick having pain, not sleeping well, recovery from an injury or having an alcohol and drug use disorder. Fight or Flight Sudden or ongoing stress activates your nervous system  and floods your bloodstream with adrenaline and cortisol, two hormones that raise blood pressure, increase heart rate and spike blood sugar. These changes pitch your body into a fight or flight response. That enabled our ancestors to outrun saber-toothed tigers, and it's helpful today for situations like dodging a car accident. But most modern chronic stressors, such as finances or a challenging relationship, keep your body in that heightened state, which hurts your health. Effects of Too Much Stress If constantly under stress, most of us  will eventually start to function less well.  Multiple studies link chronic stress to a higher risk of heart disease, stroke, depression, weight gain, memory loss and even premature death, so it's important to recognize the warning signals. Talk to your doctor about ways to manage stress if you're experiencing any of these symptoms: Prolonged periods of poor sleep. Regular, severe headaches. Unexplained weight loss or gain. Feelings of isolation, withdrawal or worthlessness. Constant anger and irritability. Loss of interest in activities. Constant worrying or obsessive thinking. Excessive alcohol or drug use. Inability to concentrate.  10 Ways to Cope with Chronic Stress It's key to recognize stressful situations as they occur because it allows you to focus on managing how you react. We all need to know when to close our eyes and take a deep breath when we feel tension rising. Use these tips to prevent or reduce chronic stress. 1. Rebalance Work and Home All work and no play? If you're spending too much time at the office, intentionally put more dates in your calendar to enjoy time for fun, either alone or with others. 2. Get Regular Exercise Moving your body on a regular basis balances the nervous system and increases blood circulation, helping to flush out stress hormones. Even a daily 20-minute walk makes a difference. Any kind of exercise can lower stress  and improve your mood ? just pick activities that you enjoy and make it a regular habit. 3. Eat Well and Limit Alcohol and Stimulants Alcohol, nicotine and caffeine may temporarily relieve stress but have negative health impacts and can make stress worse in the long run. Well-nourished bodies cope better, so start with a good breakfast, add more organic fruits and vegetables for a well-balanced diet, avoid processed foods and sugar, try herbal tea and drink more water. 4. Connect with Supportive People Talking face to face with another person releases hormones that reduce stress. Lean on those good listeners in your life. 5. Carve Out Hobby Time Do you enjoy gardening, reading, listening to music or some other creative pursuit? Engage  in activities that bring you pleasure and joy; research shows that reduces stress by almost half and lowers your heart rate, too. 6. Practice Meditation, Stress Reduction or Yoga Relaxation techniques activate a state of restfulness that counterbalances your body's fight-or-flight hormones. Even if this also means a 10-minute break in a long day: listen to music, read, go for a walk in nature, do a hobby, take a bath or spend time with a friend. Also consider doing a mindfulness exercise or try a daily deep breathing or imagery practice. Deep Breathing Slow, calm and deep breathing can help you relax. Try these steps to focus on your breathing and repeat as needed. Find a comfortable position and close your eyes. Exhale and drop your shoulders. Breathe in through your nose; fill your lungs and then your belly. Think of relaxing your body, quieting your mind and becoming calm and peaceful. Breathe out slowly through your nose, relaxing your belly. Think of releasing tension, pain, worries or distress. Repeat steps three and four until you feel relaxed. Imagery This involves using your mind to excite the senses -- sound, vision, smell, taste and feeling. This may help  ease your stress. Begin by getting comfortable and then do some slow breathing. Imagine a place you love being at. It could be somewhere from your childhood, somewhere you vacationed or just a place in your imagination. Feel how it is to be in the place you're imagining. Pay attention to the sounds, air, colors, and who is there with you. This is a place where you feel cared for and loved. All is well. You are safe. Take in all the smells, sounds, tastes and feelings. As you do, feel your body being nourished and healed. Feel the calm that surrounds you. Breathe in all the good. Breathe out any discomfort or tension. 7. Sleep Enough If you get less than seven to eight hours of sleep, your body won't tolerate stress as well as it could. If stress keeps you up at night, address the cause, and add extra meditation into your day to make up for the lost z's. Try to get seven to nine hours of sleep each night. Make a regular bedtime schedule. Keep your room dark and cool. Try to avoid computers, TV, cell phones and tablets before bed. 8. Bond with Connections You Enjoy Go out for a coffee with a friend, chat with a neighbor, call a family member, visit with a clergy member, or even hang out with your pet. Clinical studies show that spending even a short time with a companion animal can cut anxiety levels almost in half. 9. Take a Vacation Getting away from it all can reset your stress tolerance by increasing your mental and emotional outlook, which makes you a happier, more productive person upon return. Leave your cellphone and laptop at home! 10. See a Counselor, Coach or Therapist If negative thoughts overwhelm your ability to make positive changes, it's time to seek professional help. Make an appointment today--your health and life are worth it.  Lyle Rung, BSW, MSW, LCSW Licensed Clinical Social Worker [employer]   [email or web address] Direct Dial:  [phone]

## 2023-05-04 NOTE — Patient Outreach (Signed)
 Medicaid Managed Care Social Work Note  05/04/2023 Name:  Cheryl Chandler MRN:  [id number] DOB:  1965-07-27  Cheryl Chandler is an 58 y.o. year old female who is a primary patient of [name], NP.  The Medicaid Managed Care Coordination team was consulted for assistance with:  Mental Health Counseling and Resources  [name] was given information about Medicaid Managed Care Coordination team services today. Cheryl Chandler Patient agreed to services and verbal consent obtained.  Engaged with patient  for by telephone forfollow up visit in response to referral for case management and/or care coordination services.   Patient is participating in a Managed Medicaid Plan:  Yes  Assessments/Interventions:  Review of past medical history, allergies, medications, health status, including review of consultants reports, laboratory and other test data, was performed as part of comprehensive evaluation and provision of chronic care management services.  SDOH: (Social Drivers of Health) assessments and interventions performed: [name]    Flowsheet Row Patient Outreach Telephone from 05/04/2023 in [place] [facility] DEPART[facility] Most recent reading at 05/04/2023 11:48 AM Patient Outreach Telephone from 05/04/2023 in [place] [facility] DEPARTMENT Most recent reading at 05/04/2023  9:18 AM Patient Outreach Telephone from 04/02/2023 in [place] POPULATION HEALTH DEPARTMENT Most recent reading at 04/02/2023 12:54 PM Patient Outreach Telephone from 04/01/2023 in [place] POPULATION HEALTH DEPARTMENT Most recent reading at 04/01/2023  2:44 PM Patient Outreach Telephone from 03/23/2023 in [place] POPULATION HEALTH DEPARTMENT Most recent reading at 03/23/2023 11:23 AM Patient Outreach Telephone from 03/08/2023 in [place] POPULATION HEALTH DEPARTMENT Most recent reading at 03/08/2023 12:23 PM  [name]        Housing Interventions --  Intervention Not Indicated -- -- -- --  Transportation Interventions -- Intervention Not Indicated -- -- -- --  Utilities Interventions -- -- -- Intervention Not Indicated -- --  Alcohol Usage Interventions -- -- Intervention Not Indicated (Score <7) -- -- Intervention Not Indicated (Score <7)  Stress Interventions Community Resources Provided, Provide Counseling -- -- -- [employer], Provide Counseling  [Aunt recently passed] --  Social Connections Interventions -- -- Intervention Not Indicated -- -- Intervention Not Indicated  Health Literacy Interventions -- -- -- Intervention Not Indicated -- --       Advanced Directives Status:  See Care Plan for related entries.  Care Plan                 Allergies  Allergen Reactions   Codeine Shortness Of Breath and Rash   Percocet [Oxycodone-Acetaminophen ] Shortness Of Breath   Sulfa Antibiotics Shortness Of Breath and Rash   Aspirin Hives   Bee Venom     Bee stings   Darvon [Propoxyphene]     Darvocet-N 100   Latex    Oxycodone Nausea And Vomiting   Penicillins     Has patient had a PCN reaction causing immediate rash, facial/tongue/throat swelling, SOB or lightheadedness with hypotension: Unknown Has patient had a PCN reaction causing severe rash involving mucus membranes or skin necrosis: Unknown Has patient had a PCN reaction that required hospitalization: Unknown Has patient had a PCN reaction occurring within the last 10 years: Unknown If all of the above answers are NO, then may proceed with Cephalosporin use.    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   Gastroenteritis due to norovirus 05/01/2023   Hypothyroidism 05/01/2023   T2DM  (type 2 diabetes mellitus) (HCC) 05/01/2023   Dermatitis 02/02/2023   Cellulitis 02/01/2023   Venous ulcer of ankle, left (HCC) 11/16/2022   Moderate episode of recurrent major depressive disorder (HCC) 08/03/2022   Controlled type 2 diabetes mellitus with diabetic polyneuropathy, without long-term current use of insulin  (HCC) 08/03/2022   Venous ulcer (HCC) 05/03/2022   Foot pain, right 09/01/2021   Fibrocystic disease of both breasts 05/07/2019   Chronic venous insufficiency 11/09/2018   Overactive bladder 09/30/2018   Peripheral neuropathy 09/30/2018   Apnea 01/02/2016   Lymphedema 11/30/2014   Dyspnea on exertion 11/30/2014   Anxiety 11/29/2014   Arthritis 11/29/2014   COPD (chronic obstructive pulmonary disease) (HCC) 11/29/2014   Clinical depression 11/29/2014   H/O eating disorder 11/29/2014   Esophageal reflux 11/29/2014   Acne inversa 11/29/2014   Essential hypertension 11/29/2014   HLD (hyperlipidemia) 11/29/2014   Adult hypothyroidism 11/29/2014   Insomnia 11/29/2014   Low back pain 11/29/2014   Morbid obesity (HCC) 11/29/2014   Posttraumatic stress disorder 11/29/2014   Vitamin D  deficiency 11/29/2014   Nicotine dependence, cigarettes, uncomplicated 11/29/2014    Conditions to be addressed/monitored per PCP order:  Depression  Care Plan : General Social Work (Adult)  Updates made by Merlynn Lyle CROME, LCSW since 05/04/2023 12:00 AM     Problem: Coping Skills (General Plan of Care)      Long-Range Goal: Coping Skills Enhanced   Start Date: 11/07/2020  Expected End Date: 03/07/2022  Recent Progress: On track  Priority: High  Note:   Timeframe:  Long-Range Goal Priority:  High Start Date:       08/26/21                 Expected End Date:  ongoing  Follow up date- 06/07/23 at 11 am   Current Barriers:  Financial constraints Mental Health Concerns  Social Isolation Limited education about mental health support resources that are available to her within the  local area* Cognitive Deficits Lacks knowledge of community resource   Clinical Social Work Clinical Goal(s):  Over the next 90 days, client will work with SW to address concerns related to experiencing ongoing symptoms of depression, sadness and grief since the passing of her mother Over the next 90 days, patient will work with LCSW to address needs related to implementing appropriate self-care and depression management.    Interventions: Patient interviewed and appropriate assessments performed Provided mental health counseling with regards to patient's loss and ongoing stressors. Patient was very close with her mother who passed away a few years ago and she continues to experience symptoms of grief from this loss. Patient denies wanting LCSW to make a referral for grief counseling through [facility] again but was receptive to coping skill and resource education/information that was provided during session today. Patient reports decorating for the holidays helps her to connect with her mother as they did this together when she was alive.  Provided patient with information about managing depression within her daily life. Patient has ongoing grief symptoms as well. Patient states I feel worn down. Patient reports that she talks out loud to her mother's picture and this is very helpful to her. Grief support resource education provided again during session today.  Provided reflective listening and implemented appropriate interventions to help support  patient and her emotional needs  Discussed plans with patient for ongoing care management follow up and provided patient with direct contact information for [facility] team Advised patient to contact [facility] team with any urgent case management needs. Assisted patient/caregiver with obtaining information about health plan benefits LCSW completed past referral for family to have a wheelchair ramp built at their residence due to ongoing mobility concerns. Ramp has  been successfully installed.  Positive reinforcement provided to patient for quitting smoking cigarettes. Coping skill review education provided for future triggers.  Brief self-care education provided to both patient and spouse. Patient confirms that she continues to go to food pantry for food insecurity. Resource information has been provided.  Patient reports ongoing pain and issues with mobility. Patient shares that her pain resides in her pains and legs. Emotional support and coping skill education provided. Patient confirms stable transportation arrangements for upcoming appointments next week.  Patient reports that she does not have an aide at this time. Patient reports that her continues to be a positive support for her and someone that she can rely on in the case of emergencies.  Patient was educated on healthy self-care skills and advised her to use these into her daily routine.  Patient reports that she has been elevating her legs daily to elevate pain and swelling. [facility] LCSW 06/25/22 update- Patient reports that she is stable and her emotions/feelings have been more manageable lately but she and her spouse are still struggling financially. Patient still declines wanting a referral for a long term therapist so that she can help manage these financial stressors that trigger her stress. Patient is aware of [facility] resource that she was very close to starting therapy with in 2023. Stress management education provided. Patient is agreeable to a 60 day follow up call. 07/28/22- Patient shares that her depression has lifted recently but she is worried as the anniversary of her moms' passing is coming soon. [facility] LCSW provided reeducation on her available resources. She was educated on healthy self-care and reports that she is able to get outside today to get some Vitamin D  and spend time with her nieces, nephews and sister. Positive reinforcement provided. 08/24/22- Patient has been experiencing  recent grief stressors and was tearful during social work session. Patient was strongly encouraged to consider grief therapy as her uncle buck and aunt sissy are both in critical care at the hospital. [facility] LCSW provided extensive emotional support and will do a 30 day follow up call due to recent additional stressors. Update- Patient's uncle passed and she is experiencing some grief related to this loss. Emotional support provided. [facility] LCSW encouraged patient to consider reaching out to [facility] to gain therapy but she reports not needing this support right now. 11/03/22- Patient reports that she has been making a daily effort to eat healthy and attempt to be a little more active. Patient reports that she has lost over 100 lbs. [facility] LCSW provided extensive positive reinforcement for this achievement. Patient was encouraged to continue putting in place healthy boundaries with negative people, places and things to maintain her happiness and health. 01/21/23 update- Patient's aunt will be taken off life support today which has triggered her grief. She reports that she is not feeling well and will not be able to go visit her aunt in the hospital to say goodbye which would have helped her gain some closure but her legs are really bothering her and are not healing. Grief management support provided. Patient continues  to not want therapy even though it was strongly encouraged by [facility] LCSW. [facility] LCSW will follow up in 60 days due to current family loss. Patient reports that she and her spouse have been decorating for Halloween which brings them closeness and joy. 03/23/23- Patient has had several recent family losses. Most recently, patient's aunt (very close family member of hers) passed away on 2023-04-08 and she is experiencing severe grief. [facility] LCSW strongly encouraged patient to consider grief support as it would be very helpful in alleviating her symptoms but she declined as she is currently overwhelmed from multiple  stressors. Patient reports that she is lonely and is not getting her usual support which were daily phone calls placed by family members since several of these individuals have now passed. Extensive emotional support provided during session. Patient would greatly benefit from talk therapy (in person or virtual). [facility] LCSW sent patient grief support resources to consider. [facility] LCSW will follow up in 60 days. 05/03/22 update-Patient confirms that she got home yesterday on 05/03/23 from her recent hospital admission at [facility]. Patient reports that she has the norovirus. She reports that she is physically or mentally not doing well. She shares that they found another cyst on her ovaries, and she is worried that it is cancer. She reports that they did do imaging to check on this adnexal mass but recommended follow up. Extensive emotional support was provided. Pt has PULM appt 1/20.  Hospitalized 1-4 to 1-6 for gastroenteritis. Patient was advised to schedule  f/u with PCP and GYN-for ovarian cyst.    Managing Loss, Adult People experience loss in many different ways throughout their lives. Events such as moving, changing jobs, and losing friends can create a sense of loss. The loss may be as serious as a major health change, divorce, death of a pet, or death of a loved one. All of these types of loss are likely to create a physical and emotional reaction known as grief. Grief is the result of a major change or an absence of something or someone that you count on. Grief is a normal reaction to loss. A variety of factors can affect your grieving experience, including: The nature of your loss. Your relationship to what or whom you lost. Your understanding of grief and how to manage it. Your support system. How to manage lifestyle changes Keep to your normal routine as much as possible. If you have trouble focusing or doing normal activities, it is acceptable to take some time away from your  normal routine. Spend time with friends and loved ones. Eat a healthy diet, get plenty of sleep, and rest when you feel tired. How to recognize changes  The way that you deal with your grief will affect your ability to function as you normally do. When grieving, you may experience these changes: Numbness, shock, sadness, anxiety, anger, denial, and guilt. Thoughts about death. Unexpected crying. A physical sensation of emptiness in your stomach. Problems sleeping and eating. Tiredness (fatigue). Loss of interest in normal activities. Dreaming about or imagining seeing the person who died. A need to remember what or whom you lost. Difficulty thinking about anything other than your loss for a period of time. Relief. If you have been expecting the loss for a while, you may feel a sense of relief when it happens. Follow these instructions at home:    Activity Express your feelings in healthy ways, such as: Talking with others about your loss. It may  be helpful to find others who have had a similar loss, such as a support group. Writing down your feelings in a journal. Doing physical activities to release stress and emotional energy. Doing creative activities like painting, sculpting, or playing or listening to music. Practicing resilience. This is the ability to recover and adjust after facing challenges. Reading some resources that encourage resilience may help you to learn ways to practice those behaviors. General instructions Be patient with yourself and others. Allow the grieving process to happen, and remember that grieving takes time. It is likely that you may never feel completely done with some grief. You may find a way to move on while still cherishing memories and feelings about your loss. Accepting your loss is a process. It can take months or longer to adjust. Keep all follow-up visits as told by your health care provider. This is important. Where to find support To get  support for managing loss: Ask your health care provider for help and recommendations, such as grief counseling or therapy. Think about joining a support group for people who are managing a loss. Where to find more information You can find more information about managing loss from: American Society of Clinical Oncology: www.cancer.net American Psychological Association: dicetournament.ca Contact a health care provider if: Your grief is extreme and keeps getting worse. You have ongoing grief that does not improve. Your body shows symptoms of grief, such as illness. You feel depressed, anxious, or lonely. Get help right away if: You have thoughts about hurting yourself or others. If you ever feel like you may hurt yourself or others, or have thoughts about taking your own life, get help right away. You can go to your nearest emergency department or call: Your local emergency services (911 in the [place] A suicide crisis helpline, such as the National Suicide Prevention Lifeline at 561 608 8191. This is open 24 hours a day. Summary Grief is the result of a major change or an absence of someone or something that you count on. Grief is a normal reaction to loss. The depth of grief and the period of recovery depend on the type of loss and your ability to adjust to the change and process your feelings. Processing grief requires patience and a willingness to accept your feelings and talk about your loss with people who are supportive. It is important to find resources that work for you and to realize that people experience grief differently. There is not one grieving process that works for everyone in the same way. Be aware that when grief becomes extreme, it can lead to more severe issues like isolation, depression, anxiety, or suicidal thoughts. Talk with your health care provider if you have any of these issues. This information is not intended to replace advice given to you by your health care  provider. Make sure you discuss any questions you have with your health care provider. Document Revised: 06/17/2018 Document Reviewed: 08/27/2016 Elsevier Patient Education  2020 [employer].  Help for Managing Grief   When you are experiencing grief, many resources and support are available to help you. You are not alone.    Grief Share (https://www.sunglassspecialist.gl):    [employer] of [address] [place], [place]      [employer]  [phone] [address] [place], [place]     [employer] [address] [place], [place]      [employer] [address] [place], [place]  [phone]  [facility] [address] [place], [place] [phone]   [facility] [address] [place], [place]     [phone]   [facility] [address] [place], [place]     [phone]   If a Hospice or Palliative Care team has been involved in the care of your loved one, you may reach out to your contact there or locally, you may reach out to [facility]:    [facility]   [address] [place], [place] [address] [phone] Patient was educated on healthy self-care as she admits she has little motivation to walk lately. PCS recommendation was suggested.   Patient Self Care Activities:  Attends all scheduled provider appointments Calls provider office for new concerns or questions  Please see past updates related to this goal by clicking on the Past Updates button in the selected goal   Follow up goal     Follow up:  Patient agrees to Care Plan and Follow-up.  Plan: The Managed Medicaid care management team will reach out to the patient again over the next 30 days.  Lyle Rung, BSW, MSW, LCSW Licensed Clinical Social Worker [employer]   [facility] [email or web address] Direct Dial:  [phone]

## 2023-05-04 NOTE — Patient Instructions (Signed)
 Visit Information  Cheryl Chandler was given information about Medicaid Managed Care team care coordination services as a part of their [facility] Medicaid benefit. Cheryl Chandler verbally consented to engagement with the [facility] Managed Care team.   If you are experiencing a medical emergency, please call 911 or report to your local emergency department or urgent care.   If you have a non-emergency medical problem during routine business hours, please contact your provider's office and ask to speak with a nurse.   For questions related to your [facility] health plan, please call: [phone] or go here:[email or web address]  If you would like to schedule transportation through your [facility] plan, please call the following number at least 2 days in advance of your appointment: [phone].   You can also use the MTM portal or MTM mobile app to manage your rides. Reimbursement for transportation is available through [facility]! For the portal, please go to [email or web address]  Call the [facility] Crisis Line at [phone], at any time, 24 hours a day, 7 days a week. If you are in danger or need immediate medical attention call 911.  If you would like help to quit smoking, call 1-800-QUIT-NOW (551 510 0024) OR Espaol: 1-855-Djelo-Ya (8-144-664-6430) o para ms informacin haga clic aqu or Text READY to 799-599 to register via text  Cheryl Chandler - following are the goals we discussed in your visit today:   Goals Addressed             This Visit's Progress    RNCM: Keep Skin Clean and Dry       Follow Up Date: 06/04/23  - clean and dry skin well - keep skin dry - use a fragrance-free lotion on skin - wear a protective pad or garment    Why is this important?   Leaking urine (pee) can cause soreness from skin rashes and redness.  This is caused by skin being exposed to urine (pee).  05/04/23:  Recently hospitalized with Norovirus-will need to change  DME companies d/t insurance-patient will contact PCP for order     RNCM: Track and Manage My Symptoms-Depression       Timeframe:  Long-Range Goal Priority:  High Start Date:    12-02-2020                         Expected End Date:   ongoing                Follow Up Date: 06/04/23   - avoid negative self-talk - develop a personal safety plan - develop a plan to deal with triggers like holidays, anniversaries - exercise at least 2 to 3 times per week - have a plan for how to handle bad days - journal feelings and what helps to feel better or worse - spend time or talk with others at least 2 to 3 times per week - spend time or talk with others every day - watch for early signs of feeling worse    Why is this important?   Keeping track of your progress will help your treatment team find the right mix of medicine and therapy for you.  Write in your journal every day.  Day-to-day changes in depression symptoms are normal. It may be more helpful to check your progress at the end of each week instead of every day.   05/04/23:  Followed by LCSW-has been given resources and declines therapy services.     RNCM: Track and  Manage My Triggers-COPD       Timeframe:  Long-Range Goal Priority:  Medium Start Date:     05-22-2020                        Expected End Date:   ongoing          Follow Up Date: 05/04/23   - avoid second hand smoke - eliminate smoking in my home - identify and avoid work-related triggers - identify and remove indoor air pollutants - limit outdoor activity during cold weather - listen for public air quality announcements every day    Why is this important?   Triggers are activities or things, like tobacco smoke or cold weather, that make your COPD (chronic obstructive pulmonary disease) flare-up.  Knowing these triggers helps you plan how to stay away from them.  When you cannot remove them, you can learn how to manage them.   05/04/23:  Currently on 3L of continuous Oxygen ,  has PULM appt 1/20.   Patient verbalizes understanding of instructions and care plan provided today and agrees to view in MyChart. Active MyChart status and patient understanding of how to access instructions and care plan via MyChart confirmed with patient.     The Managed Medicaid care management team will reach out to the patient again over the next 30 business  days.  The  Patient has been provided with contact information for the Managed Medicaid care management team and has been advised to call with any health related questions or concerns.   Veva Angles RN, BSN [place]  [facility] Care Management Coordinator - Managed Medicaid High Risk [phone]   Following is a copy of your plan of care:  Care Plan : RNCM: COPD (Adult)  Updates made by Angles Veva MATSU, RN since 05/04/2023 12:00 AM     Problem: RNCM: Psychological Adjustment to Diagnosis (COPD)   Priority: Medium  Onset Date: 05/22/2020     Long-Range Goal: RNCM: Adjustment to Disease Achieved   Start Date: 05/22/2020  Expected End Date: 06/02/2023  Recent Progress: On track  Priority: High  Note:   Current Barriers:  Knowledge deficits related to basic understanding of COPD disease process Knowledge deficits related to basic COPD self care/management Knowledge deficit related to basic understanding of how to use inhalers and how inhaled medications work Knowledge deficit related to importance of energy conservation Limited Social Support Unable to independently manage COPD Lacks social connections Does not contact provider office for questions/concerns  03/03/24:  patient on 2L continuous Oxygen .  Has PULM appt 1/20.  Hospitalized 1-4 to 1-6 for gastroenteritis.  To schedule  f/u with PCP and GYN-has ovarian cyst.  Limited conversation today due to being sick.  Case Manager Clinical Goal(s): patient will report using inhalers as prescribed including rinsing mouth after use patient will be able to  verbalize understanding of COPD action plan and when to seek appropriate levels of medical care  patient will engage in lite exercise as tolerated to build/regain stamina and strength and reduce shortness of breath through activity tolerance  patient will verbalize basic understanding of COPD disease process and self care activities   Interventions:  Inter-disciplinary care team collaboration (see longitudinal plan of care) UNABLE to independently: manage COPD Provided patient with basic written and verbal COPD education on self care/management/and exacerbation prevention Provided patient with COPD action plan and reinforced importance of daily self assessment.  Provided written and verbal instructions on pursed  lip breathing and utilized returned demonstration as teach back Provided instruction about proper use of medications used for management of COPD including inhalers Advised patient to self assesses COPD action plan zone and make appointment with provider if in the yellow zone for 48 hours without improvement. Provided patient with education about the role of exercise in the management of COPD Advised patient to engage in light exercise as tolerated 3-5 days a week Provided education about and advised patient to utilize infection prevention strategies to reduce risk of respiratory infection.  Patient Goals/Self-Care Activities:  - decision-making supported - depression screen reviewed - emotional support provided - family involvement promoted - problem-solving facilitated - relaxation techniques promoted - verbalization of feelings encouraged  Follow Up Plan: Telephone follow up appointment with care management team member scheduled.

## 2023-05-05 ENCOUNTER — Ambulatory Visit: Payer: Self-pay | Admitting: *Deleted

## 2023-05-05 NOTE — Telephone Encounter (Signed)
 I recommend she increase her fluids and drink pedialyte and follow a bland diet such as bananas, apple sauce, rice and toast.    She can use the zofran for nausea and I recommend a probiotic to help with the good bacteria in the GI tract.

## 2023-05-05 NOTE — Telephone Encounter (Addendum)
  Chief Complaint: Discharged from hospital yesterday 05/04/2023. L  Admitted with Norovirus and another virus.    Still having diarrhea, and abd pain today.   Drinking Ginger Ale, jello and pudding   Unable to tolerate solids at all     A little lightheaded and very weak.   Symptoms: above Frequency: Had 5-6 episodes of diarrhea last night and 4-5 episodes today.   Main complaint is abd pain real bad. Pertinent Negatives: Patient denies vomiting.    Disposition: [] ED /[] Urgent Care (no appt availability in office) / [] Appointment(In office/virtual)/ []  [place] [facility]/ [] Home Care/ [] Refused Recommended Disposition /[] [place] [facility]/ [x]  Follow-up with PCP Additional Notes: Because she is contagious she doesn't want to go out.   She is not active on MyChart so a virtual visit can be scheduled.   She really doesn't want to go to the ED again.   She stayed in the ED the whole time because there are no beds.     I called and spoke with Iris at the practice.   She is scheduled for 05/11/2023 at 11:20 with a virtual visit with Darice Petty, NP.  I instructed pt to return to the ED if her abd pain became worse or the diarrhea became worse.    Also if she started feeling dizzy and lightheaded or weak with ambulation to return to the ED.    She verbalized understanding and thanked me for my help.

## 2023-05-05 NOTE — Telephone Encounter (Signed)
 Reason for Disposition  [1] MODERATE diarrhea (e.g., 4-6 times / day more than normal) AND [2] present > 48 hours (2 days)  Answer Assessment - Initial Assessment Questions 1. DIARRHEA SEVERITY: How bad is the diarrhea? How many more stools have you had in the past 24 hours than normal?    - NO DIARRHEA (SCALE 0)   - MILD (SCALE 1-3): Few loose or mushy BMs; increase of 1-3 stools over normal daily number of stools; mild increase in ostomy output.   -  MODERATE (SCALE 4-7): Increase of 4-6 stools daily over normal; moderate increase in ostomy output.   -  SEVERE (SCALE 8-10; OR WORST POSSIBLE): Increase of 7 or more stools daily over normal; moderate increase in ostomy output; incontinence.     Discharged from hospital due to Norovirus and another virus.    I'm having some diarrhea.  It's much better now.   They gave me some nausea medicine.   I'm trying to hold down liquids.   I'm getting maybe a bottle of water in a day.    My BP and glucose were very low in the hospital.    I have diabetes.      I take Trulicity .     They tried to give me solid foods in the hospital but I couldn't eat it.    I still can't eat.   I'm drink Ginger Ale and Jello and pudding.    2. ONSET: When did the diarrhea begin?      I was in the hospital 05/01/2023.   Discharged 05/03/2023. 3. BM CONSISTENCY: How loose or watery is the diarrhea?      I'm having diarrhea and stomach cramping.   My stomach is killing me real bad.   My husband told me he is having diarrhea too.     He is eating and doing better.    4. VOMITING: Are you also vomiting? If Yes, ask: How many times in the past 24 hours?      Not vomiting 5. ABDOMEN PAIN: Are you having any abdomen pain? If Yes, ask: What does it feel like? (e.g., crampy, dull, intermittent, constant)      Abd pain.    6. ABDOMEN PAIN SEVERITY: If present, ask: How bad is the pain?  (e.g., Scale 1-10; mild, moderate, or severe)   - MILD (1-3): doesn't interfere  with normal activities, abdomen soft and not tender to touch    - MODERATE (4-7): interferes with normal activities or awakens from sleep, abdomen tender to touch    - SEVERE (8-10): excruciating pain, doubled over, unable to do any normal activities       They diagnosed me with mass cyst on my ovary.   I'm to see my OB GYN for that.   I talked with them today.   Once I'm over this virus I'll see her. 7. ORAL INTAKE: If vomiting, Have you been able to drink liquids? How much liquids have you had in the past 24 hours?     I'm drinking Ginger Al, jello and pudding.   8. HYDRATION: Any signs of dehydration? (e.g., dry mouth [not just dry lips], too weak to stand, dizziness, new weight loss) When did you last urinate?     I'm so weak.    I was discharged from the hospital yesterday. 9. EXPOSURE: Have you traveled to a foreign country recently? Have you been exposed to anyone with diarrhea? Could you have eaten any food that was  spoiled?     They don't know.    We never go out.   We stay at home most of the time.   We had a plumber come in on Christmas Eve but he was fine.    My husband's father was sick but he stayed in the car.   I wasn't exposed to him.   I was feeling bad around Christmas.   I did vomit on Christmas but not after that. 10. ANTIBIOTIC USE: Are you taking antibiotics now or have you taken antibiotics in the past 2 months?       I'm on an antibiotic with 4 more days to go.    The hospital dr told me not to take my BP pill.   So I'm not taking it.    11. OTHER SYMPTOMS: Do you have any other symptoms? (e.g., fever, blood in stool)       Nausea and abd pain. 12. PREGNANCY: Is there any chance you are pregnant? When was your last menstrual period?       N/A due to pain.  Protocols used: [facility]

## 2023-05-05 NOTE — Telephone Encounter (Signed)
 Called and notified patient of Karen's message. Patient verbalized understanding.

## 2023-05-06 ENCOUNTER — Telehealth: Payer: Self-pay | Admitting: Nurse Practitioner

## 2023-05-06 NOTE — Telephone Encounter (Signed)
 Called patient to inform her that we do have any resources that  can help

## 2023-05-06 NOTE — Telephone Encounter (Signed)
 Copied from CRM [id number]. Topic: General - Inquiry >> May 05, 2023  1:58 PM Donnal HERO wrote: Reason for CRM: Pt stated she tested positive for cholera and norovirus. Is asking if there is somewhere that she can get Lysol and Clorox wipes; she stated they are struggling financially.  Stated the company that helps her with her depends, her insurance is no longer in network, and she needs assistance finding a different company. Please advise.

## 2023-05-10 ENCOUNTER — Ambulatory Visit (INDEPENDENT_AMBULATORY_CARE_PROVIDER_SITE_OTHER): Payer: Medicaid Other | Admitting: Vascular Surgery

## 2023-05-11 ENCOUNTER — Encounter: Payer: Self-pay | Admitting: Nurse Practitioner

## 2023-05-11 ENCOUNTER — Telehealth (INDEPENDENT_AMBULATORY_CARE_PROVIDER_SITE_OTHER): Payer: Medicaid Other | Admitting: Nurse Practitioner

## 2023-05-11 DIAGNOSIS — J449 Chronic obstructive pulmonary disease, unspecified: Secondary | ICD-10-CM

## 2023-05-11 DIAGNOSIS — A0811 Acute gastroenteropathy due to Norwalk agent: Secondary | ICD-10-CM | POA: Diagnosis not present

## 2023-05-11 MED ORDER — ONDANSETRON 4 MG PO TBDP: 4.0000 mg | ORAL_TABLET | Freq: Four times a day (QID) | ORAL | 0 refills | Status: DC | PRN

## 2023-05-11 MED ORDER — ALBUTEROL SULFATE (2.5 MG/3ML) 0.083% IN NEBU: INHALATION_SOLUTION | RESPIRATORY_TRACT | 0 refills | Status: AC

## 2023-05-11 NOTE — Assessment & Plan Note (Signed)
 Improving.  Encouraged patient to stay well hydrated and rest.  Encouraged her to continue to drink clear liquids.  Advance diet as tolerated.  Recommend starting with BRAT diet.

## 2023-05-11 NOTE — Progress Notes (Signed)
 LMP 06/09/2017 (Approximate)    Subjective:    Patient ID: Cheryl Chandler, female    DOB: [birth year], 58 y.o.   MRN: [id number]  HPI: Cheryl Chandler is a 58 y.o. female  Chief Complaint  Patient presents with   Abdominal Pain   Diarrhea   Emesis   Medication Refill    albuterol    Patient states she is weak and tired.  She is not having diarrhea or throwing up anymore.  States her emesis stopped yesterday.  She doesn't have an appetite but is working on drinking fluids and clear liquids.  She is having a headache also.  She is using tylenol  as needed.  She is using zofran  as needed.     Relevant past medical, surgical, family and social history reviewed and updated as indicated. Interim medical history since our last visit reviewed. Allergies and medications reviewed and updated.  Review of Systems  Constitutional:  Positive for appetite change.  Gastrointestinal:  Positive for nausea. Negative for diarrhea and vomiting.  Neurological:  Positive for weakness.    Per HPI unless specifically indicated above     Objective:    LMP 06/09/2017 (Approximate)   Wt Readings from Last 3 Encounters:  05/01/23 (!) 315 lb 0.6 oz (142.9 kg)  02/01/23 (!) 315 lb (142.9 kg)  12/18/22 (!) 326 lb (147.9 kg)    Physical Exam Vitals and nursing note reviewed.  HENT:     Head: Normocephalic.     Right Ear: Hearing normal.     Left Ear: Hearing normal.     Nose: Nose normal.  Eyes:     Pupils: Pupils are equal, round, and reactive to light.  Pulmonary:     Effort: Pulmonary effort is normal. No respiratory distress.  Neurological:     Mental Status: She is alert.  Psychiatric:        Mood and Affect: Mood normal.        Behavior: Behavior normal.        Thought Content: Thought content normal.        Judgment: Judgment normal.     Results for orders placed or performed during the hospital encounter of 05/01/23  Lipase, blood   Collection Time: 05/01/23  5:10 AM   Result Value Ref Range   Lipase 23 11 - 51 U/L  Comprehensive metabolic panel   Collection Time: 05/01/23  5:10 AM  Result Value Ref Range   Sodium 140 135 - 145 mmol/L   Potassium 3.8 3.5 - 5.1 mmol/L   Chloride 104 98 - 111 mmol/L   CO2 19 (L) 22 - 32 mmol/L   Glucose, Bld 144 (H) 70 - 99 mg/dL   BUN 20 6 - 20 mg/dL   Creatinine, Ser 9.11 0.44 - 1.00 mg/dL   Calcium  9.3 8.9 - 10.3 mg/dL   Total Protein 8.4 (H) 6.5 - 8.1 g/dL   Albumin 3.6 3.5 - 5.0 g/dL   AST 26 15 - 41 U/L   ALT 27 0 - 44 U/L   Alkaline Phosphatase 101 38 - 126 U/L   Total Bilirubin 0.5 0.0 - 1.2 mg/dL   GFR, Estimated >39 >39 mL/min   Anion gap 17 (H) 5 - 15  CBC   Collection Time: 05/01/23  5:10 AM  Result Value Ref Range   WBC 16.5 (H) 4.0 - 10.5 K/uL   RBC 4.79 3.87 - 5.11 MIL/uL   Hemoglobin 13.5 12.0 - 15.0 g/dL   HCT 57.2 63.9 -  46.0 %   MCV 89.1 80.0 - 100.0 fL   MCH 28.2 26.0 - 34.0 pg   MCHC 31.6 30.0 - 36.0 g/dL   RDW 84.0 (H) 88.4 - 84.4 %   Platelets 413 (H) 150 - 400 K/uL   nRBC 0.0 0.0 - 0.2 %  Beta-hydroxybutyric acid   Collection Time: 05/01/23  5:10 AM  Result Value Ref Range   Beta-Hydroxybutyric Acid 0.31 (H) 0.05 - 0.27 mmol/L  Gastrointestinal Panel by PCR , Stool   Collection Time: 05/01/23  1:56 PM   Specimen: Stool  Result Value Ref Range   Campylobacter species NOT DETECTED NOT DETECTED   Plesimonas shigelloides NOT DETECTED NOT DETECTED   Salmonella species NOT DETECTED NOT DETECTED   Yersinia enterocolitica NOT DETECTED NOT DETECTED   Vibrio species DETECTED (A) NOT DETECTED   Vibrio cholerae DETECTED (A) NOT DETECTED   Enteroaggregative E coli (EAEC) NOT DETECTED NOT DETECTED   Enteropathogenic E coli (EPEC) NOT DETECTED NOT DETECTED   Enterotoxigenic E coli (ETEC) NOT DETECTED NOT DETECTED   Shiga like toxin producing E coli (STEC) NOT DETECTED NOT DETECTED   Shigella/Enteroinvasive E coli (EIEC) NOT DETECTED NOT DETECTED   Cryptosporidium NOT DETECTED NOT  DETECTED   Cyclospora cayetanensis NOT DETECTED NOT DETECTED   Entamoeba histolytica NOT DETECTED NOT DETECTED   Giardia lamblia NOT DETECTED NOT DETECTED   Adenovirus F40/41 NOT DETECTED NOT DETECTED   Astrovirus NOT DETECTED NOT DETECTED   Norovirus GI/GII DETECTED (A) NOT DETECTED   Rotavirus A NOT DETECTED NOT DETECTED   Sapovirus (I, II, IV, and V) NOT DETECTED NOT DETECTED  CA 125   Collection Time: 05/01/23  1:56 PM  Result Value Ref Range   Cancer Antigen (CA) 125 6.3 0.0 - 38.1 U/mL  hCG, quantitative, pregnancy   Collection Time: 05/01/23  1:56 PM  Result Value Ref Range   hCG, Beta Chain, Quant, S <1 <5 mIU/mL  Magnesium   Collection Time: 05/01/23  1:56 PM  Result Value Ref Range   Magnesium 2.4 1.7 - 2.4 mg/dL  CBG monitoring, ED   Collection Time: 05/01/23 10:27 PM  Result Value Ref Range   Glucose-Capillary 121 (H) 70 - 99 mg/dL  Basic metabolic panel   Collection Time: 05/02/23  4:46 AM  Result Value Ref Range   Sodium 137 135 - 145 mmol/L   Potassium 3.4 (L) 3.5 - 5.1 mmol/L   Chloride 104 98 - 111 mmol/L   CO2 20 (L) 22 - 32 mmol/L   Glucose, Bld 112 (H) 70 - 99 mg/dL   BUN 13 6 - 20 mg/dL   Creatinine, Ser 9.19 0.44 - 1.00 mg/dL   Calcium  8.0 (L) 8.9 - 10.3 mg/dL   GFR, Estimated >39 >39 mL/min   Anion gap 13 5 - 15  Magnesium   Collection Time: 05/02/23  4:46 AM  Result Value Ref Range   Magnesium 2.0 1.7 - 2.4 mg/dL  CBC with Differential/Platelet   Collection Time: 05/02/23  4:46 AM  Result Value Ref Range   WBC 7.0 4.0 - 10.5 K/uL   RBC 3.92 3.87 - 5.11 MIL/uL   Hemoglobin 10.9 (L) 12.0 - 15.0 g/dL   HCT 66.1 (L) 63.9 - 53.9 %   MCV 86.2 80.0 - 100.0 fL   MCH 27.8 26.0 - 34.0 pg   MCHC 32.2 30.0 - 36.0 g/dL   RDW 83.9 (H) 88.4 - 84.4 %   Platelets 328 150 - 400 K/uL   nRBC  0.0 0.0 - 0.2 %   Neutrophils Relative % 83 %   Neutro Abs 5.7 1.7 - 7.7 K/uL   Lymphocytes Relative 8 %   Lymphs Abs 0.6 (L) 0.7 - 4.0 K/uL   Monocytes Relative 8 %    Monocytes Absolute 0.6 0.1 - 1.0 K/uL   Eosinophils Relative 1 %   Eosinophils Absolute 0.0 0.0 - 0.5 K/uL   Basophils Relative 0 %   Basophils Absolute 0.0 0.0 - 0.1 K/uL   Immature Granulocytes 0 %   Abs Immature Granulocytes 0.03 0.00 - 0.07 K/uL  Hemoglobin A1c   Collection Time: 05/02/23  4:46 AM  Result Value Ref Range   Hgb A1c MFr Bld 5.8 (H) 4.8 - 5.6 %   Mean Plasma Glucose 120 mg/dL  CA 19-9 (SERIAL)   Collection Time: 05/02/23  4:46 AM  Result Value Ref Range   CA 19-9 5 0 - 35 U/mL  CBG monitoring, ED   Collection Time: 05/02/23  7:34 AM  Result Value Ref Range   Glucose-Capillary 104 (H) 70 - 99 mg/dL  CBG monitoring, ED   Collection Time: 05/02/23 11:57 AM  Result Value Ref Range   Glucose-Capillary 98 70 - 99 mg/dL  CBG monitoring, ED   Collection Time: 05/02/23  4:29 PM  Result Value Ref Range   Glucose-Capillary 73 70 - 99 mg/dL  CBG monitoring, ED   Collection Time: 05/02/23 10:38 PM  Result Value Ref Range   Glucose-Capillary 87 70 - 99 mg/dL  CBC   Collection Time: 05/03/23  4:05 AM  Result Value Ref Range   WBC 5.5 4.0 - 10.5 K/uL   RBC 3.53 (L) 3.87 - 5.11 MIL/uL   Hemoglobin 10.1 (L) 12.0 - 15.0 g/dL   HCT 68.8 (L) 63.9 - 53.9 %   MCV 88.1 80.0 - 100.0 fL   MCH 28.6 26.0 - 34.0 pg   MCHC 32.5 30.0 - 36.0 g/dL   RDW 83.8 (H) 88.4 - 84.4 %   Platelets 286 150 - 400 K/uL   nRBC 0.0 0.0 - 0.2 %  Basic metabolic panel   Collection Time: 05/03/23  4:05 AM  Result Value Ref Range   Sodium 136 135 - 145 mmol/L   Potassium 3.6 3.5 - 5.1 mmol/L   Chloride 104 98 - 111 mmol/L   CO2 25 22 - 32 mmol/L   Glucose, Bld 98 70 - 99 mg/dL   BUN 13 6 - 20 mg/dL   Creatinine, Ser 9.11 0.44 - 1.00 mg/dL   Calcium  7.9 (L) 8.9 - 10.3 mg/dL   GFR, Estimated >39 >39 mL/min   Anion gap 7 5 - 15  CBG monitoring, ED   Collection Time: 05/03/23  8:59 AM  Result Value Ref Range   Glucose-Capillary 94 70 - 99 mg/dL  CBG monitoring, ED   Collection Time:  05/03/23 11:20 AM  Result Value Ref Range   Glucose-Capillary 87 70 - 99 mg/dL      Assessment & Plan:   Problem List Items Addressed This Visit       Respiratory   COPD (chronic obstructive pulmonary disease) (HCC)   Relevant Medications   albuterol  (PROVENTIL ) (2.5 MG/3ML) 0.083% nebulizer solution     Digestive   Norovirus - Primary   Improving.  Encouraged patient to stay well hydrated and rest.  Encouraged her to continue to drink clear liquids.  Advance diet as tolerated.  Recommend starting with BRAT diet.  Follow up plan: Return if symptoms worsen or fail to improve.  This visit was completed via MyChart due to the restrictions of the COVID-19 pandemic. All issues as above were discussed and addressed. Physical exam was done as above through visual confirmation on MyChart. If it was felt that the patient should be evaluated in the office, they were directed there. The patient verbally consented to this visit. Location of the patient: Home Location of the provider: Office Those involved with this call:  Provider: Darice Petty, NP CMA: Rolande Edison, CMA Front Desk/Registration: Claretta Maiden This encounter was conducted via video.  I spent 20  dedicated to the care of this patient on the date of this encounter to include previsit review of symptoms, plan of care and follow up, face to face time with the patient, and post visit ordering of testing.

## 2023-05-12 ENCOUNTER — Ambulatory Visit: Payer: Medicaid Other | Admitting: Nurse Practitioner

## 2023-05-12 ENCOUNTER — Telehealth: Payer: Self-pay | Admitting: Nurse Practitioner

## 2023-05-12 NOTE — Telephone Encounter (Signed)
 Copied from CRM [id number]. Topic: General - Other >> May 12, 2023  1:30 PM Everette C wrote: Reason for CRM: Mindy with [employer] has called to request oxygen  chart notes from the past 6 months for the patient   The patient has no been seen by their previous Pulmonologist (Dr. Coralie Derrick) within the past 6 months   Please contact Mindy at [phone] ext [phone] when possible   Notes can be faxed back to [employer] at [phone] Attn: Mindy   Please contact further if needed

## 2023-05-13 NOTE — Telephone Encounter (Signed)
Last 2 office notes printed and faxed to Mindy at [facility] as requested.

## 2023-05-17 ENCOUNTER — Ambulatory Visit: Payer: Medicaid Other | Admitting: Pulmonary Disease

## 2023-05-17 ENCOUNTER — Other Ambulatory Visit: Payer: Self-pay

## 2023-05-17 NOTE — Patient Instructions (Signed)
Visit Information  [name] was given information about Medicaid Managed Care team care coordination services as a part of their [facility] Medicaid benefit. Cheryl Chandler verbally consented to engagement with the [facility] Managed Care team.   If you are experiencing a medical emergency, please call 911 or report to your local emergency department or urgent care.   If you have a non-emergency medical problem during routine business hours, please contact your provider's office and ask to speak with a nurse.   For questions related to your [facility] health plan, please call: [phone] or go here:[email or web address]  If you would like to schedule transportation through your [facility] plan, please call the following number at least 2 days in advance of your appointment: [phone].   You can also use the MTM portal or MTM mobile app to manage your rides. Reimbursement for transportation is available through [facility]! For the portal, please go to [email or web address]  Call the [facility] Crisis Line at [phone], at any time, 24 hours a day, 7 days a week. If you are in danger or need immediate medical attention call 911.  If you would like help to quit smoking, call 1-800-QUIT-NOW ((202)394-7961) OR Espaol: 1-855-Djelo-Ya (3-474-259-5638) o para ms informacin haga clic aqu or Text READY to 756-433 to register via text  Cheryl Chandler - following are the goals we discussed in your visit today:   Goals Addressed   None      The  Patient                                              has been provided with contact information for the Managed Medicaid care management team and has been advised to call with any health related questions or concerns.  [name], Kenard Gower, MHA [facility]  Managed Medicaid Social Worker [phone]   Following is a copy of your plan of care:  There are no care plans that you recently modified to display for  this patient.

## 2023-05-17 NOTE — Patient Outreach (Signed)
Medicaid Managed Care Social Work Note  05/17/2023 Name:  Cheryl Chandler MRN:  [id number] DOB:  1965/12/27  Cheryl Chandler is an 58 y.o. year old female who is a primary patient of [name], NP.  The Medicaid Managed Care Coordination team was consulted for assistance with:  Community Resources   Ms. Pangallo was given information about Medicaid Managed Care Coordination team services today. Cheryl Chandler Patient agreed to services and verbal consent obtained.  Engaged with patient  for by telephone forfollow up visit in response to referral for case management and/or care coordination services.   Patient is participating in a Managed Medicaid Plan:  Yes  Assessments/Interventions:  Review of past medical history, allergies, medications, health status, including review of consultants reports, laboratory and other test data, was performed as part of comprehensive evaluation and provision of chronic care management services.  SDOH: (Social Drivers of Health) assessments and interventions performed: [name]    Flowsheet Row Patient Outreach Telephone from 05/04/2023 in [place] POPULATION HEALTH DEPARTMENT Most recent reading at 05/04/2023 11:48 AM Patient Outreach Telephone from 05/04/2023 in [place] POPULATION HEALTH DEPARTMENT Most recent reading at 05/04/2023  9:18 AM Patient Outreach Telephone from 04/02/2023 in [place] POPULATION HEALTH DEPARTMENT Most recent reading at 04/02/2023 12:54 PM Patient Outreach Telephone from 04/01/2023 in [place] POPULATION HEALTH DEPARTMENT Most recent reading at 04/01/2023  2:44 PM Patient Outreach Telephone from 03/23/2023 in [place] POPULATION HEALTH DEPARTMENT Most recent reading at 03/23/2023 11:23 AM Patient Outreach Telephone from 03/08/2023 in [place] POPULATION HEALTH DEPARTMENT Most recent reading at 03/08/2023 12:23 PM  [name]        Housing Interventions -- Intervention Not  Indicated -- -- -- --  Transportation Interventions -- Intervention Not Indicated -- -- -- --  Utilities Interventions -- -- -- Intervention Not Indicated -- --  Alcohol Usage Interventions -- -- Intervention Not Indicated (Score <7) -- -- Intervention Not Indicated (Score <7)  Stress Interventions Community Resources Provided, Provide Counseling -- -- -- [employer], Provide Counseling  [Aunt recently passed] --  Social Connections Interventions -- -- Intervention Not Indicated -- -- Intervention Not Indicated  Health Literacy Interventions -- -- -- Intervention Not Indicated -- --     BSW completed a telephone outreach with patient, she states she is feeling better since having the Norovirus, she states she has not heard anything from disability yet, she wants to apply for Medicare, BSW provided patient with the website for Medicare. Patient states they are still receiving foodstamps and going to the pantries. No other resources are needed at this time. BSW provided patient with her contact information for any future needs.   Advanced Directives Status:  Not addressed in this encounter.  Care Plan                 Allergies  Allergen Reactions   Codeine Shortness Of Breath and Rash   Percocet [Oxycodone-Acetaminophen] Shortness Of Breath   Sulfa Antibiotics Shortness Of Breath and Rash   Aspirin Hives   Bee Venom     Bee stings   Darvon [Propoxyphene]     Darvocet-N 100   Latex    Oxycodone Nausea And Vomiting   Penicillins     Has patient had a PCN reaction causing immediate rash, facial/tongue/throat swelling, SOB or lightheadedness with hypotension: Unknown Has patient had a PCN reaction causing severe rash involving mucus membranes or skin necrosis: Unknown Has patient had a PCN reaction  that required hospitalization: Unknown Has patient had a PCN reaction occurring within the last 10 years: Unknown If all of the above answers are "NO", then may proceed  with Cephalosporin use.    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   Gastroenteritis due to norovirus 05/01/2023   Hypothyroidism 05/01/2023   T2DM (type 2 diabetes mellitus) (HCC) 05/01/2023   Dermatitis 02/02/2023   Cellulitis 02/01/2023   Venous ulcer of ankle, left (HCC) 11/16/2022   Moderate episode of recurrent major depressive disorder (HCC) 08/03/2022   Controlled type 2 diabetes mellitus with diabetic polyneuropathy, without long-term current use of insulin (HCC) 08/03/2022   Venous ulcer (HCC) 05/03/2022   Foot pain, right 09/01/2021   Fibrocystic disease of both breasts 05/07/2019   Chronic venous insufficiency 11/09/2018   Overactive bladder 09/30/2018   Peripheral neuropathy 09/30/2018   Apnea 01/02/2016   Lymphedema 11/30/2014   Dyspnea on exertion 11/30/2014   Anxiety 11/29/2014   Arthritis 11/29/2014   COPD (chronic obstructive pulmonary disease) (HCC) 11/29/2014   Clinical depression 11/29/2014   H/O eating disorder 11/29/2014   Esophageal reflux 11/29/2014   Acne inversa 11/29/2014   Essential hypertension 11/29/2014   HLD (hyperlipidemia) 11/29/2014   Adult hypothyroidism 11/29/2014   Insomnia 11/29/2014   Low back pain 11/29/2014   Morbid obesity (HCC) 11/29/2014   Posttraumatic stress disorder 11/29/2014   Vitamin D deficiency 11/29/2014   Nicotine dependence, cigarettes, uncomplicated 11/29/2014    Conditions to be addressed/monitored per PCP order:   community resources  There are no care plans that you recently modified to display for this patient.   Follow up:  Patient agrees to Care Plan and Follow-up.  Plan: The  Patient has been provided with contact information for the  Managed Medicaid care management team and has been advised to call with any health related questions or concerns.    Abelino Derrick, MHA [facility]  Managed [facility] Social Worker [phone]

## 2023-05-18 DIAGNOSIS — M1990 Unspecified osteoarthritis, unspecified site: Secondary | ICD-10-CM | POA: Diagnosis not present

## 2023-05-19 ENCOUNTER — Telehealth: Payer: Self-pay | Admitting: Nurse Practitioner

## 2023-05-19 NOTE — Telephone Encounter (Signed)
Copied from CRM [id number]. Topic: General - Other >> May 19, 2023  1:53 PM Clide Dales wrote: Patient called to let provider know that [facility] is sending over and order form for patients incontinence supplies.

## 2023-05-20 DIAGNOSIS — F32A Depression, unspecified: Secondary | ICD-10-CM | POA: Diagnosis not present

## 2023-05-20 DIAGNOSIS — J4489 Other specified chronic obstructive pulmonary disease: Secondary | ICD-10-CM | POA: Diagnosis not present

## 2023-05-20 DIAGNOSIS — I10 Essential (primary) hypertension: Secondary | ICD-10-CM | POA: Diagnosis not present

## 2023-05-20 DIAGNOSIS — F419 Anxiety disorder, unspecified: Secondary | ICD-10-CM | POA: Diagnosis not present

## 2023-05-20 DIAGNOSIS — E785 Hyperlipidemia, unspecified: Secondary | ICD-10-CM | POA: Diagnosis not present

## 2023-05-20 DIAGNOSIS — E1140 Type 2 diabetes mellitus with diabetic neuropathy, unspecified: Secondary | ICD-10-CM | POA: Diagnosis not present

## 2023-05-20 DIAGNOSIS — I890 Lymphedema, not elsewhere classified: Secondary | ICD-10-CM | POA: Diagnosis not present

## 2023-05-20 DIAGNOSIS — Z6841 Body Mass Index (BMI) 40.0 and over, adult: Secondary | ICD-10-CM | POA: Diagnosis not present

## 2023-05-20 DIAGNOSIS — J9611 Chronic respiratory failure with hypoxia: Secondary | ICD-10-CM | POA: Diagnosis not present

## 2023-05-20 DIAGNOSIS — E039 Hypothyroidism, unspecified: Secondary | ICD-10-CM | POA: Diagnosis not present

## 2023-05-20 DIAGNOSIS — Z9981 Dependence on supplemental oxygen: Secondary | ICD-10-CM | POA: Diagnosis not present

## 2023-05-20 DIAGNOSIS — K219 Gastro-esophageal reflux disease without esophagitis: Secondary | ICD-10-CM | POA: Diagnosis not present

## 2023-05-21 ENCOUNTER — Telehealth (INDEPENDENT_AMBULATORY_CARE_PROVIDER_SITE_OTHER): Payer: Self-pay

## 2023-05-21 DIAGNOSIS — N3941 Urge incontinence: Secondary | ICD-10-CM | POA: Diagnosis not present

## 2023-05-21 NOTE — Telephone Encounter (Signed)
Patient stated she will call her PCP to see if she will up her dosage.

## 2023-05-21 NOTE — Telephone Encounter (Addendum)
Patient called stating she is having bilateral leg pain which has been going on for a while. She has been taking Gabapentin 300 mg. She stated her PCP (original prescriber) told her she could take two at a time, but it's not working.  She also mentioned she has occupational therapy which causes her to cry and unable to finish session.   See last Gabapentin refill and note attached to it.

## 2023-05-21 NOTE — Telephone Encounter (Signed)
We will not fill gabapentin for the patient as it is for her neuropathy and this is a neurological issue, not a vascular issue.  She needs to discuss this with her PCP (the original prescriber).

## 2023-05-25 ENCOUNTER — Ambulatory Visit: Payer: Self-pay

## 2023-05-25 NOTE — Telephone Encounter (Signed)
I recommend patient be seen in the ER due to not remembering the fall.  She may have lost consciousness and needs to be evaluated.

## 2023-05-25 NOTE — Telephone Encounter (Signed)
Called and spoke with patient. Notified patient of providers recommendation. Patient states the therapist is coming out to check on her today.

## 2023-05-25 NOTE — Telephone Encounter (Signed)
FYI to provider

## 2023-05-25 NOTE — Telephone Encounter (Signed)
Chief Complaint: fell forward on face cannot remember what happended Symptoms: 8/10 soreness all over, mild headache- did have a knot near forehead but is now gone Frequency: 0100 this am  Pertinent Negatives: Patient denies dizziness, weakness or numbness to face arms or legs, pt is alert and oriented to time, per, place Disposition: [] ED /[] Urgent Care (no appt availability in office) / [] Appointment(In office/virtual)/ []  [place] [facility]/ [] Home Care/ [x] Refused Recommended Disposition /[] [place] [facility]/ []  Follow-up with PCP Additional Notes: pt stated that if she worsens she will call EMS- pt stated that they do not have any gas money for appt.  Reason for Disposition  Can't remember what happened (amnesia)  Answer Assessment - Initial Assessment Questions 1. MECHANISM: "How did the injury happen?" For falls, ask: "What height did you fall from?" and "What surface did you fall against?"      Larey Seat forward  unsure hit chair or bedside commode 2. ONSET: "When did the injury happen?" (Minutes or hours ago)      0100 3. NEUROLOGIC SYMPTOMS: "Was there any loss of consciousness?" "Are there any other neurological symptoms?"      Unsure - no dizziness, headache (mild) 4. MENTAL STATUS: "Does the person know who they are, who you are, and where they are?"      yes 5. LOCATION: "What part of the head was hit?"      Forehead  6. SCALP APPEARANCE: "What does the scalp look like? Is it bleeding now?" If Yes, ask: "Is it difficult to stop?"      No cuts or scrapes 7. SIZE: For cuts, bruises, or swelling, ask: "How large is it?" (e.g., inches or centimeters)      Knots but now a 8. PAIN: "Is there any pain?" If Yes, ask: "How bad is it?"  (e.g., Scale 1-10; or mild, moderate, severe)     mild 9. TETANUS: For any breaks in the skin, ask: "When was the last tetanus booster?"     N/a 10. OTHER SYMPTOMS: "Do you have any other symptoms?" (e.g., neck pain, vomiting)       Was in a  daze when EMS arrived  11. PREGNANCY: "Is there any chance you are pregnant?" "When was your last menstrual period?"       N/a  Answer Assessment - Initial Assessment Questions 1. MECHANISM: "How did the fall happen?"     Don't know sitting on cough next thing she kne was on floor  3. ONSET: "When did the fall happen?" (e.g., minutes, hours, or days ago)     Last night  4. LOCATION: "What part of the body hit the ground?" (e.g., back, buttocks, head, hips, knees, hands, head, stomach)     Hit face, left leg  5. INJURY: "Did you hurt (injure) yourself when you fell?" If Yes, ask: "What did you injure? Tell me more about this?" (e.g., body area; type of injury; pain severity)"     No knot near forehead  6. PAIN: "Is there any pain?" If Yes, ask: "How bad is the pain?" (e.g., Scale 1-10; or mild,  moderate, severe)   - NONE (0): No pain   - MILD (1-3): Doesn't interfere with normal activities    - MODERATE (4-7): Interferes with normal activities or awakens from sleep    - SEVERE (8-10): Excruciating pain, unable to do any normal activities      Sore neck back legs 8/10, mild headache, front and back  7. SIZE: For cuts,  bruises, or swelling, ask: "How large is it?" (e.g., inches or centimeters)      Has not check  9. OTHER SYMPTOMS: "Do you have any other symptoms?" (e.g., dizziness, fever, weakness; new onset or worsening).      ? LOC no dizziness  10. CAUSE: "What do you think caused the fall (or falling)?" (e.g., tripped, dizzy spell)       Unsure sleep vs-  Protocols used: Falls and Falling-A-AH, Head Injury-A-AH

## 2023-05-27 DIAGNOSIS — Z9981 Dependence on supplemental oxygen: Secondary | ICD-10-CM | POA: Diagnosis not present

## 2023-05-27 DIAGNOSIS — F32A Depression, unspecified: Secondary | ICD-10-CM | POA: Diagnosis not present

## 2023-05-27 DIAGNOSIS — I10 Essential (primary) hypertension: Secondary | ICD-10-CM | POA: Diagnosis not present

## 2023-05-27 DIAGNOSIS — J4489 Other specified chronic obstructive pulmonary disease: Secondary | ICD-10-CM | POA: Diagnosis not present

## 2023-05-27 DIAGNOSIS — E785 Hyperlipidemia, unspecified: Secondary | ICD-10-CM | POA: Diagnosis not present

## 2023-05-27 DIAGNOSIS — Z6841 Body Mass Index (BMI) 40.0 and over, adult: Secondary | ICD-10-CM | POA: Diagnosis not present

## 2023-05-27 DIAGNOSIS — E039 Hypothyroidism, unspecified: Secondary | ICD-10-CM | POA: Diagnosis not present

## 2023-05-27 DIAGNOSIS — J9611 Chronic respiratory failure with hypoxia: Secondary | ICD-10-CM | POA: Diagnosis not present

## 2023-05-27 DIAGNOSIS — E1140 Type 2 diabetes mellitus with diabetic neuropathy, unspecified: Secondary | ICD-10-CM | POA: Diagnosis not present

## 2023-05-27 DIAGNOSIS — F419 Anxiety disorder, unspecified: Secondary | ICD-10-CM | POA: Diagnosis not present

## 2023-05-27 DIAGNOSIS — K219 Gastro-esophageal reflux disease without esophagitis: Secondary | ICD-10-CM | POA: Diagnosis not present

## 2023-05-27 DIAGNOSIS — I890 Lymphedema, not elsewhere classified: Secondary | ICD-10-CM | POA: Diagnosis not present

## 2023-05-28 LAB — MISCELLANEOUS TEST

## 2023-05-29 DIAGNOSIS — Z419 Encounter for procedure for purposes other than remedying health state, unspecified: Secondary | ICD-10-CM | POA: Diagnosis not present

## 2023-05-30 DIAGNOSIS — I890 Lymphedema, not elsewhere classified: Secondary | ICD-10-CM | POA: Diagnosis not present

## 2023-05-30 DIAGNOSIS — Z96641 Presence of right artificial hip joint: Secondary | ICD-10-CM

## 2023-05-30 DIAGNOSIS — E785 Hyperlipidemia, unspecified: Secondary | ICD-10-CM

## 2023-05-30 DIAGNOSIS — E1140 Type 2 diabetes mellitus with diabetic neuropathy, unspecified: Secondary | ICD-10-CM | POA: Diagnosis not present

## 2023-05-30 DIAGNOSIS — I10 Essential (primary) hypertension: Secondary | ICD-10-CM

## 2023-05-30 DIAGNOSIS — F419 Anxiety disorder, unspecified: Secondary | ICD-10-CM

## 2023-05-30 DIAGNOSIS — J9611 Chronic respiratory failure with hypoxia: Secondary | ICD-10-CM | POA: Diagnosis not present

## 2023-05-30 DIAGNOSIS — Z7985 Long-term (current) use of injectable non-insulin antidiabetic drugs: Secondary | ICD-10-CM

## 2023-05-30 DIAGNOSIS — Z792 Long term (current) use of antibiotics: Secondary | ICD-10-CM

## 2023-05-30 DIAGNOSIS — Z9981 Dependence on supplemental oxygen: Secondary | ICD-10-CM

## 2023-05-30 DIAGNOSIS — Z6841 Body Mass Index (BMI) 40.0 and over, adult: Secondary | ICD-10-CM

## 2023-05-31 ENCOUNTER — Telehealth: Payer: Self-pay | Admitting: Licensed Clinical Social Worker

## 2023-05-31 DIAGNOSIS — I10 Essential (primary) hypertension: Secondary | ICD-10-CM

## 2023-05-31 DIAGNOSIS — I890 Lymphedema, not elsewhere classified: Secondary | ICD-10-CM | POA: Diagnosis not present

## 2023-05-31 DIAGNOSIS — K219 Gastro-esophageal reflux disease without esophagitis: Secondary | ICD-10-CM | POA: Diagnosis not present

## 2023-05-31 DIAGNOSIS — F419 Anxiety disorder, unspecified: Secondary | ICD-10-CM | POA: Diagnosis not present

## 2023-05-31 DIAGNOSIS — F32A Depression, unspecified: Secondary | ICD-10-CM | POA: Diagnosis not present

## 2023-05-31 DIAGNOSIS — J9611 Chronic respiratory failure with hypoxia: Secondary | ICD-10-CM | POA: Diagnosis not present

## 2023-05-31 DIAGNOSIS — E785 Hyperlipidemia, unspecified: Secondary | ICD-10-CM | POA: Diagnosis not present

## 2023-05-31 DIAGNOSIS — Z9981 Dependence on supplemental oxygen: Secondary | ICD-10-CM | POA: Diagnosis not present

## 2023-05-31 DIAGNOSIS — E1142 Type 2 diabetes mellitus with diabetic polyneuropathy: Secondary | ICD-10-CM

## 2023-05-31 DIAGNOSIS — Z6841 Body Mass Index (BMI) 40.0 and over, adult: Secondary | ICD-10-CM | POA: Diagnosis not present

## 2023-05-31 DIAGNOSIS — J449 Chronic obstructive pulmonary disease, unspecified: Secondary | ICD-10-CM

## 2023-05-31 DIAGNOSIS — J4489 Other specified chronic obstructive pulmonary disease: Secondary | ICD-10-CM | POA: Diagnosis not present

## 2023-05-31 DIAGNOSIS — E1140 Type 2 diabetes mellitus with diabetic neuropathy, unspecified: Secondary | ICD-10-CM | POA: Diagnosis not present

## 2023-05-31 DIAGNOSIS — E039 Hypothyroidism, unspecified: Secondary | ICD-10-CM | POA: Diagnosis not present

## 2023-05-31 NOTE — Patient Outreach (Signed)
Care Coordination  05/31/2023  Cheryl Chandler [birth year] [id number]  Patient called [facility] LCSW today asking for specific referral for pharmacy assistance. She reports having questions about medications and issues affording certain medications that she needs. [facility] LCSW successfully placed referral today for [facility].  [name], BSW, MSW, LCSW Licensed Clinical Social Worker [employer]   [email or web address] Direct Dial: [phone]

## 2023-06-01 ENCOUNTER — Telehealth: Payer: Self-pay

## 2023-06-01 NOTE — Progress Notes (Signed)
 Care Guide Pharmacy Note  06/01/2023 Name: [name] MRN: [id number] DOB: [birth year]  Referred By: [name], NP Reason for referral: Care Coordination (Outreach to schedule with Pharm d )   Cheryl Chandler is a 58 y.o. year old female who is a primary care patient of [name], NP.  Megham Corie Allis was referred to the pharmacist for assistance related to: DMII  Successful contact was made with the patient to discuss pharmacy services including being ready for the pharmacist to call at least 5 minutes before the scheduled appointment time and to have medication bottles and any blood pressure readings ready for review. The patient agreed to meet with the pharmacist via telephone visit on (date/time).06/08/2023  Jeoffrey Buffalo , RMA     Bret Harte  [facility], [facility] Guide  Direct Dial: [phone]  Website: .[email or web address]

## 2023-06-04 ENCOUNTER — Other Ambulatory Visit: Payer: Self-pay | Admitting: Obstetrics and Gynecology

## 2023-06-04 NOTE — Patient Outreach (Signed)
  Medicaid Managed Care   Unsuccessful Attempt Note   06/04/2023 Name: Cheryl Chandler MRN: [id number] DOB: 31-Aug-1965  Referred by: [name], NP Reason for referral : High Risk Managed Medicaid (Unsuccessful telephone outreach/)  An unsuccessful telephone outreach was attempted today. The patient was referred to the case management team for assistance with care management and care coordination.    Follow Up Plan: The Managed Medicaid care management team will reach out to the patient again over the next 30 business  days. and The  Patient has been provided with contact information for the Managed Medicaid care management team and has been advised to call with any health related questions or concerns.   Veva Angles RN, BSN, [employer] [facility] RN Care Manager Direct Dial [phone]/Qjk [phone] Website: [email or web address]

## 2023-06-04 NOTE — Patient Instructions (Signed)
 Visit Information  Ms. Aiko Raeya Merritts  - as a part of your Medicaid benefit, you are eligible for care management and care coordination services at no cost or copay. I was unable to reach you by phone today but would be happy to help you with your health related needs. Please feel free to call me at [phone].  A member of the Managed Medicaid care management team will reach out to you again over the next 30 business  days.   Veva Angles RN, BSN, [employer] [facility] RN Care Manager Direct Dial [phone]/Qjk [phone] Website: [email or web address]

## 2023-06-07 ENCOUNTER — Other Ambulatory Visit: Payer: Self-pay | Admitting: Licensed Clinical Social Worker

## 2023-06-07 NOTE — Patient Outreach (Signed)
 Medicaid Managed Care Social Work Note  06/07/2023 Name:  Dionni Sura MRN:  [id number] DOB:  [birth year]  Kaelynn Shahnaz Klass is an 58 y.o. year old female who is a primary patient of Aileen Alexanders, NP.  The Medicaid Managed Care Coordination team was consulted for assistance with:  Mental Health Counseling and Resources  Ms. Forst was given information about Medicaid Managed Care Coordination team services today. Daleyssa Toni Frank Patient agreed to services and verbal consent obtained.  Engaged with patient  for by telephone forfollow up visit in response to referral for case management and/or care coordination services.   Patient is participating in a Managed Medicaid Plan:  Yes  Assessments/Interventions:  Review of past medical history, allergies, medications, health status, including review of consultants reports, laboratory and other test data, was performed as part of comprehensive evaluation and provision of chronic care management services.  SDOH: (Social Drivers of Health) assessments and interventions performed: [name]    Flowsheet Row Patient Outreach Telephone from 06/07/2023 in [place] [facility] POP[facility]ALTH DEPARTMENT Most recent reading at 06/07/2023  2:47 PM Patient Outreach Telephone from 05/04/2023 in [place] POPULATION HEALTH DEPARTMENT Most recent reading at 05/04/2023 11:48 AM Patient Outreach Telephone from 05/04/2023 in [place] POPULATION HEALTH DEPARTMENT Most recent reading at 05/04/2023  9:18 AM Patient Outreach Telephone from 04/02/2023 in [place] POPULATION HEALTH DEPARTMENT Most recent reading at 04/02/2023 12:54 PM Patient Outreach Telephone from 04/01/2023 in [place] POPULATION HEALTH DEPARTMENT Most recent reading at 04/01/2023  2:44 PM Patient Outreach Telephone from 03/23/2023 in [place] POPULATION HEALTH DEPARTMENT Most recent reading at 03/23/2023 11:23 AM  [name]        Housing Interventions -- --  Intervention Not Indicated -- -- --  Transportation Interventions -- -- Intervention Not Indicated -- -- --  Utilities Interventions -- -- -- -- Intervention Not Indicated --  Alcohol Usage Interventions -- -- -- Intervention Not Indicated (Score <7) -- --  Stress Interventions Provide Counseling, [employer] Provided, Other (Comment)  [Pt had a recent fall and health issues that have increased her overal stress but pt continues to decline [facility] treatment. [facility] LCSW provided [employer] education again during outreach today.] [employer] Provided, Provide Counseling -- -- -- [employer], Provide Counseling  [Aunt recently passed]  Social Connections Interventions -- -- -- Intervention Not Indicated -- --  Health Literacy Interventions -- -- -- -- Intervention Not Indicated --       Advanced Directives Status:  See Care Plan for related entries.  Care Plan                 Allergies  Allergen Reactions   Codeine Shortness Of Breath and Rash   Percocet [Oxycodone-Acetaminophen ] Shortness Of Breath   Sulfa Antibiotics Shortness Of Breath and Rash   Aspirin Hives   Bee Venom     Bee stings   Darvon [Propoxyphene]     Darvocet-N 100   Latex    Oxycodone Nausea And Vomiting   Penicillins     Has patient had a PCN reaction causing immediate rash, facial/tongue/throat swelling, SOB or lightheadedness with hypotension: Unknown Has patient had a PCN reaction causing severe rash involving mucus membranes or skin necrosis: Unknown Has patient had a PCN reaction that required hospitalization: Unknown Has patient had a PCN reaction occurring within the last 10 years: Unknown If all of the above answers are "NO", then may proceed with Cephalosporin use.    Medications Reviewed  Today     Reviewed by Helga Loan (Social Worker) on 06/07/23 at 1449  Med List Status: <None>   Medication Order Taking? Sig Documenting Provider Last Dose Status Informant   ACCU-CHEK GUIDE test strip [id number] No USE TO CHECK BLOOD SUGAR 3 TIMES DAILY AS DIRECTED Aileen Alexanders, NP Taking Active Self, Other  Accu-Chek Softclix Lancets lancets [id number] No CHECK BLOOD SUGAR TWICE DAILY Aileen Alexanders, NP Taking Active Self, Other  albuterol  (PROVENTIL ) (2.5 MG/3ML) 0.083% nebulizer solution [id number]  USE 1 VIAL VIA NEBULIZER EVERY 4 HOURS AS NEEDED Aileen Alexanders, NP  Active   atorvastatin  (LIPITOR) 80 MG tablet [id number] No Take 1 tablet (80 mg total) by mouth daily. Mecum, Erin E, PA-C Taking Active Self, Other  Blood Glucose Monitoring Suppl (ACCU-CHEK GUIDE) w/Device KIT [id number] No Use to check blood sugar 3 times a day. Doran Galloway T, NP Taking Active Self, Other  Blood Pressure Monitoring (OMRON 3 SERIES BP MONITOR) DEVI [id number] No Use to check blood pressure as directed Aileen Alexanders, NP Taking Active Self, Other  citalopram  (CELEXA ) 40 MG tablet [id number] No Take 1 tablet (40 mg total) by mouth daily. Mecum, Erin E, PA-C Taking Active Self, Other  diphenhydrAMINE (BENADRYL) 25 mg capsule [id number] No Take 25 mg by mouth every 6 (six) hours as needed for itching. Taking once a day [provider] Taking Active Self, Other  docusate sodium  (COLACE) 100 MG capsule [id number] No TAKE 1 CAPSULE BY MOUTH TWICE DAILY Aileen Alexanders, NP Taking Active   Dulaglutide  (TRULICITY ) 0.75 MG/0.5ML SOPN [id number] No 0.75 mg by ultrasound guided injection route once a week. Mecum, Erin E, PA-C Taking Active Self, Other  EPINEPHrine  0.3 mg/0.3 mL IJ SOAJ injection [id number] No INJECT 1 SYRINGE INTO OUTER THIGH ONCE AS NEEDED FOR SEVERE ALLERGIC REACTION. Mecum, Erin E, PA-C Taking Active Self, Other  Fluticasone -Umeclidin-Vilant (TRELEGY ELLIPTA ) 100-62.5-25 MCG/ACT AEPB [id number] No Inhale 1 puff into the lungs daily. Marc Senior, MD Taking Active Self, Other  gabapentin  (NEURONTIN ) 300 MG capsule [id number] No TAKE 1 CAPSULE BY MOUTH 3 TIMES  DAILY ASNEEDED (NEED OFFICE VISIT FOR FURTHER REFILLS) Aileen Alexanders, NP Taking Active   hydrALAZINE  (APRESOLINE ) 50 MG tablet [id number] No Take 50 mg by mouth 3 (three) times daily. [provider] Taking Active   hydrOXYzine  (VISTARIL ) 25 MG capsule [id number] No Take 1 capsule (25 mg total) by mouth every 8 (eight) hours as needed. Mecum, Erin E, PA-C Taking Active Self, Other  levothyroxine  (SYNTHROID ) 50 MCG tablet [id number] No TAKE 1 TABLET BY MOUTH ONCE DAILY ON AN EMPTY STOMACH. WAIT 30 MINUTES BEFORE TAKING OTHER MEDS. Mecum, Erin E, PA-C Taking Active Self, Other  loperamide  (IMODIUM  A-D) 2 MG tablet [id number] No Take 1 tablet (2 mg total) by mouth 4 (four) times daily as needed for diarrhea or loose stools. Wouk, Haynes Lips, MD Taking Active   meloxicam  (MOBIC ) 15 MG tablet [id number] No TAKE 1 TABLET BY MOUTH ONCE DAILY AS NEEDED WITH FOOD OR MILK Aileen Alexanders, NP Taking Active Self, Other           Med Note Finley Hugh, CHERYL A   Fri Aug 07, 2022  9:13 AM) Taking daily  montelukast  (SINGULAIR ) 10 MG tablet [id number] No TAKE 1 TABLET BY MOUTH AT BEDTIME Aileen Alexanders, NP Taking Active Self, Other  nitroGLYCERIN  (NITROSTAT ) 0.4 MG SL tablet [id number] No DISSOLVE 1 TABLET UNDER TONGUE AT ONSET OF CHEST PAIN. REPEAT IN 5 MIN IF NOT RESOLVED, MAX 3 DOSES.  911 IF NEEDED. Aileen Alexanders, NP Taking Active Self, Other  omeprazole  (PRILOSEC) 20 MG capsule [id number] No TAKE 1 CAPSULE BY MOUTH ONCE DAILY Aileen Alexanders, NP Taking Active Self, Other  ondansetron  (ZOFRAN -ODT) 4 MG disintegrating tablet [id number]  Take 1 tablet (4 mg total) by mouth every 6 (six) hours as needed for nausea or vomiting. Aileen Alexanders, NP  Active   OXYGEN  [id number] No Inhale 3 L into the lungs daily. [provider] Taking Active Self, Other  Silver (AQUACEL AG FOAM) 8"X8" PADS [id number] No Apply to areas of ulceration on legs as directed Melodi Sprung, DO Taking Active   Vitamin  D, Ergocalciferol , (DRISDOL ) 1.25 MG (50000 UNIT) CAPS capsule [id number] No TAKE 1 CAPSULE BY MOUTH EVERY 7 DAYS Aileen Alexanders, NP Taking Active Self, Other  Wound Cleansers (VASHE WOUND) 0.033 % SOLN [id number] No Apply 1 Application topically daily. 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   Gastroenteritis due to norovirus 05/01/2023   Hypothyroidism 05/01/2023   T2DM (type 2 diabetes mellitus) (HCC) 05/01/2023   Dermatitis 02/02/2023   Cellulitis 02/01/2023   Venous ulcer of ankle, left (HCC) 11/16/2022   Moderate episode of recurrent major depressive disorder (HCC) 08/03/2022   Controlled type 2 diabetes mellitus with diabetic polyneuropathy, without long-term current use of insulin  (HCC) 08/03/2022   Venous ulcer (HCC) 05/03/2022   Foot pain, right 09/01/2021   Fibrocystic disease of both breasts 05/07/2019   Chronic venous insufficiency 11/09/2018   Overactive bladder 09/30/2018   Peripheral neuropathy 09/30/2018   Apnea 01/02/2016   Lymphedema 11/30/2014   Dyspnea on exertion 11/30/2014   Anxiety 11/29/2014   Arthritis 11/29/2014   COPD (chronic obstructive pulmonary disease) (HCC) 11/29/2014   Clinical depression 11/29/2014   H/O eating disorder 11/29/2014   Esophageal reflux 11/29/2014   Acne inversa 11/29/2014   Essential hypertension 11/29/2014   HLD (hyperlipidemia) 11/29/2014   Adult hypothyroidism 11/29/2014   Insomnia 11/29/2014   Low back pain 11/29/2014   Morbid obesity (HCC) 11/29/2014   Posttraumatic stress disorder 11/29/2014   Vitamin D  deficiency 11/29/2014   Nicotine dependence, cigarettes, uncomplicated 11/29/2014    Conditions to be addressed/monitored per PCP order:    Grief and Stress  Care Plan : General Social Work (Adult)  Updates made by Elie Grove, LCSW since 06/07/2023 12:00 AM     Problem: Coping Skills (General Plan of Care)      Long-Range Goal: Coping Skills Enhanced   Start Date: 11/07/2020  Expected End Date: 03/07/2022  Recent Progress: On track  Priority: High  Note:   Timeframe:  Long-Range Goal Priority:  High Start Date:       08/26/21                 Expected End Date:  ongoing  Follow up date- Goal met and ended on 06/07/23.   Current Barriers:  Financial constraints Mental Health Concerns  Social Isolation Limited education about mental health support resources that are available to her within the local area* Cognitive Deficits Lacks knowledge of community resource   Clinical Social Work Clinical Goal(s):  Over the next 90 days, client will work with SW to  address concerns related to experiencing ongoing symptoms of depression, sadness and grief since the passing of her mother Over the next 90 days, patient will work with LCSW to address needs related to implementing appropriate self-care and depression management.    Interventions: Patient interviewed and appropriate assessments performed Provided mental health counseling with regards to patient's loss and ongoing stressors. Patient was very close with her mother who passed away a few years ago and she continues to experience symptoms of grief from this loss. Patient denies wanting LCSW to make a referral for grief counseling through [facility] again but was receptive to coping skill and resource education/information that was provided during session today. Patient reports decorating for the holidays helps her to connect with her mother as they did this together when she was alive.  Provided patient with information about managing depression within her daily life. Patient has ongoing grief symptoms as well. Patient states "I feel worn down." Patient reports that she talks  out loud to her mother's picture and this is very helpful to her. Grief support resource education provided again during session today.  Provided reflective listening and implemented appropriate interventions to help support patient and her emotional needs  Discussed plans with patient for ongoing care management follow up and provided patient with direct contact information for [facility] team Advised patient to contact [facility] team with any urgent case management needs. Assisted patient/caregiver with obtaining information about health plan benefits LCSW completed past referral for family to have a wheelchair ramp built at their residence due to ongoing mobility concerns. Ramp has been successfully installed.  Positive reinforcement provided to patient for quitting smoking cigarettes. Coping skill review education provided for future triggers.  Brief self-care education provided to both patient and spouse. Patient confirms that she continues to go to food pantry for food insecurity. Resource information has been provided.  Patient reports ongoing pain and issues with mobility. Patient shares that her pain resides in her pains and legs. Emotional support and coping skill education provided. Patient confirms stable transportation arrangements for upcoming appointments next week.  Patient reports that she does not have an aide at this time. Patient reports that her continues to be a positive support for her and someone that she can rely on in the case of emergencies.  Patient was educated on healthy self-care skills and advised her to use these into her daily routine.  Patient reports that she has been elevating her legs daily to elevate pain and swelling. [facility] LCSW 06/25/22 update- Patient reports that she is stable and her emotions/feelings have been more manageable lately but she and her spouse are still struggling financially. Patient still declines wanting a referral for a long term therapist so that she can  help manage these financial stressors that trigger her stress. Patient is aware of [facility] resource that she was very close to starting therapy with in 2023. Stress management education provided. Patient is agreeable to a 60 day follow up call. 07/28/22- Patient shares that her depression has lifted recently but she is worried as the anniversary of her moms' passing is coming soon. [facility] LCSW provided reeducation on her available resources. She was educated on healthy self-care and reports that she is able to get outside today to get some Vitamin D  and spend time with her nieces, nephews and sister. Positive reinforcement provided. 08/24/22- Patient has been experiencing recent grief stressors and was tearful during social work session. Patient was strongly encouraged to consider grief therapy as her  uncle buck and aunt sissy are both in critical care at the hospital. [facility] LCSW provided extensive emotional support and will do a 30 day follow up call due to recent additional stressors. Update- Patient's uncle passed and she is experiencing some grief related to this loss. Emotional support provided. [facility] LCSW encouraged patient to consider reaching out to [facility] to gain therapy but she reports not needing this support right now. 11/03/22- Patient reports that she has been making a daily effort to eat healthy and attempt to be a little more active. Patient reports that she has lost over 100 lbs. [facility] LCSW provided extensive positive reinforcement for this achievement. Patient was encouraged to continue putting in place healthy boundaries with negative people, places and things to maintain her happiness and health. 01/21/23 update- Patient's aunt will be taken off life support today which has triggered her grief. She reports that she is not feeling well and will not be able to go visit her aunt in the hospital to say goodbye which would have helped her gain some closure but her legs are really bothering her and  are not healing. Grief management support provided. Patient continues to not want therapy even though it was strongly encouraged by [facility] LCSW. [facility] LCSW will follow up in 60 days due to current family loss. Patient reports that she and her spouse have been decorating for Halloween which brings them closeness and joy. 03/23/23- Patient has had several recent family losses. Most recently, patient's aunt (very close family member of hers) passed away on April 04, 2023 and she is experiencing severe grief. [facility] LCSW strongly encouraged patient to consider grief support as it would be very helpful in alleviating her symptoms but she declined as she is currently overwhelmed from multiple stressors. Patient reports that she is lonely and is not getting her usual support which were daily phone calls placed by family members since several of these individuals have now passed. Extensive emotional support provided during session. Patient would greatly benefit from talk therapy (in person or virtual). [facility] LCSW sent patient grief support resources to consider. [facility] LCSW will follow up in 60 days. 05/03/22 update-Patient confirms that she got home yesterday on 05/03/23 from her recent hospital admission at [facility] at [facility]. Patient reports that she has the norovirus. She reports that she is physically or mentally not doing well. She shares that they found another cyst on her ovaries, and she is worried that it is cancer. She reports that they did do imaging to check on this adnexal mass but recommended follow up. Extensive emotional support was provided. Pt has PULM appt 1/20.  Hospitalized 1-4 to 1-6 for gastroenteritis. Patient was advised to schedule  f/u with PCP and GYN-for ovarian cyst.  06/07/23 update- Patient reports that she had a recent fall and is suffering from this setback. She was provided emotional support. She shares that her health issues continue to increase her overall stress but she does not wish to pursue  [facility] treatment at this time. She is aware that [facility] accepted her referral in the past for telephonic therapy and would be willing to take her as a new patient but patient denies needing this level of support at this time. Patient denies any further clinical social work needs at this time. [facility] LCSW placed a pharmacy referral through our case management program and patient is scheduled to speak with both our pharmacist and nurse case manager tomorrow. Patient is agreeable to social work case closure  at this time and [facility] LCSW successfully updated involved care team.   Managing Loss, Adult People experience loss in many different ways throughout their lives. Events such as moving, changing jobs, and losing friends can create a sense of loss. The loss may be as serious as a major health change, divorce, death of a pet, or death of a loved one. All of these types of loss are likely to create a physical and emotional reaction known as grief. Grief is the result of a major change or an absence of something or someone that you count on. Grief is a normal reaction to loss. A variety of factors can affect your grieving experience, including: The nature of your loss. Your relationship to what or whom you lost. Your understanding of grief and how to manage it. Your support system. How to manage lifestyle changes Keep to your normal routine as much as possible. If you have trouble focusing or doing normal activities, it is acceptable to take some time away from your normal routine. Spend time with friends and loved ones. Eat a healthy diet, get plenty of sleep, and rest when you feel tired. How to recognize changes  The way that you deal with your grief will affect your ability to function as you normally do. When grieving, you may experience these changes: Numbness, shock, sadness, anxiety, anger, denial, and guilt. Thoughts about death. Unexpected crying. A physical sensation of emptiness in your  stomach. Problems sleeping and eating. Tiredness (fatigue). Loss of interest in normal activities. Dreaming about or imagining seeing the person who died. A need to remember what or whom you lost. Difficulty thinking about anything other than your loss for a period of time. Relief. If you have been expecting the loss for a while, you may feel a sense of relief when it happens. Follow these instructions at home:    Activity Express your feelings in healthy ways, such as: Talking with others about your loss. It may be helpful to find others who have had a similar loss, such as a support group. Writing down your feelings in a journal. Doing physical activities to release stress and emotional energy. Doing creative activities like painting, sculpting, or playing or listening to music. Practicing resilience. This is the ability to recover and adjust after facing challenges. Reading some resources that encourage resilience may help you to learn ways to practice those behaviors. General instructions Be patient with yourself and others. Allow the grieving process to happen, and remember that grieving takes time. It is likely that you may never feel completely done with some grief. You may find a way to move on while still cherishing memories and feelings about your loss. Accepting your loss is a process. It can take months or longer to adjust. Keep all follow-up visits as told by your health care provider. This is important. Where to find support To get support for managing loss: Ask your health care provider for help and recommendations, such as grief counseling or therapy. Think about joining a support group for people who are managing a loss. Where to find more information You can find more information about managing loss from: American Society of Clinical Oncology: www.cancer.net American Psychological Association: [employer].ca Contact a health care provider if: Your grief is extreme and  keeps getting worse. You have ongoing grief that does not improve. Your body shows symptoms of grief, such as illness. You feel depressed, anxious, or lonely. Get help right away if: You have thoughts about hurting  yourself or others. If you ever feel like you may hurt yourself or others, or have thoughts about taking your own life, get help right away. You can go to your nearest emergency department or call: Your local emergency services (911 in the [place] A suicide crisis helpline, such as the National Suicide Prevention Lifeline at 253-636-9585. This is open 24 hours a day. Summary Grief is the result of a major change or an absence of someone or something that you count on. Grief is a normal reaction to loss. The depth of grief and the period of recovery depend on the type of loss and your ability to adjust to the change and process your feelings. Processing grief requires patience and a willingness to accept your feelings and talk about your loss with people who are supportive. It is important to find resources that work for you and to realize that people experience grief differently. There is not one grieving process that works for everyone in the same way. Be aware that when grief becomes extreme, it can lead to more severe issues like isolation, depression, anxiety, or suicidal thoughts. Talk with your health care provider if you have any of these issues. This information is not intended to replace advice given to you by your health care provider. Make sure you discuss any questions you have with your health care provider. Document Revised: 06/17/2018 Document Reviewed: 08/27/2016 Elsevier Patient Education  2020 [employer].  Help for Managing Grief   When you are experiencing grief, many resources and support are available to help you. You are not alone.    Grief Share (https://www.[email or web address].gl):    [employer] of [address] [place], [place]       [employer]  [phone] [address] [place], [place]     [employer] [address] [place], [place]      [facility] [address] [place], [place]  [phone]   [facility] [address] [place], [place] [phone]   [place] [facility] [address] [place], [place]     [phone]   [facility] [address] [place], [place]     [phone]   If a Hospice or Palliative Care team has been involved in the care of your loved one, you may reach out to your contact there or locally, you may reach out to [facility]:    Hosp[facility] and Palliative Care of [facility]   [address] [place], [place] [address] [phone] Patient was educated on healthy self-care as she admits she has little motivation to walk lately. PCS recommendation was suggested.   Patient Self Care Activities:  Attends all scheduled provider appointments Calls provider office for new concerns or questions  Please see past updates related to this goal by clicking on the "Past Updates" button in the selected goal   Follow up goal     Follow up:  Patient requests no follow-up at this time. [facility] LCSW will sign off at this time but [facility] RNCM and Pharmacist remain involved and have appointments with patient tomorrow on 06/07/23 at 10:30 am and 11:15 am.    Plan: The Managed Medicaid care management team is available to follow up with the patient after provider conversation with the patient regarding recommendation for care management engagement and subsequent re-referral to the care management team.   Kolleen Perone, BSW, MSW, LCSW Licensed Clinical Social  Worker [employer]   [facility] [email or web address] Direct Dial: [phone]

## 2023-06-07 NOTE — Patient Instructions (Signed)
 Visit Information  Ms. Tantillo was given information about Medicaid Managed Care team care coordination services as a part of their [facility] Medicaid benefit. Shivangi Anji Terman verbally consented to engagement with the [facility] Managed Care team.   If you are experiencing a medical emergency, please call 911 or report to your local emergency department or urgent care.   If you have a non-emergency medical problem during routine business hours, please contact your provider's office and ask to speak with a nurse.   For questions related to your [facility] health plan, please call: [phone] or go here:[email or web address]  If you would like to schedule transportation through your [facility] plan, please call the following number at least 2 days in advance of your appointment: [phone].   You can also use the MTM portal or MTM mobile app to manage your rides. Reimbursement for transportation is available through [facility]! For the portal, please go to [email or web address]  Call the [facility] Crisis Line at [phone], at any time, 24 hours a day, 7 days a week. If you are in danger or need immediate medical attention call 911.  If you would like help to quit smoking, call 1-800-QUIT-NOW (4755402825) OR Espaol: 1-855-Djelo-Ya (4-132-440-1027) o para ms informacin haga clic aqu or Text READY to 253-664 to register via text  Following is a copy of your plan of care:  Care Plan : General Social Work (Adult)  Updates made by Elie Grove, LCSW since 06/07/2023 12:00 AM     Problem: Coping Skills (General Plan of Care)      Long-Range Goal: Coping Skills Enhanced   Start Date: 11/07/2020  Expected End Date: 03/07/2022  Recent Progress: On track  Priority: High  Note:   Timeframe:  Long-Range Goal Priority:  High Start Date:       08/26/21                 Expected End Date:  ongoing  Follow up date- Goal met and ended on 06/07/23.  [facility] LCSW  will sign off at this time but [facility] RNCM and Pharmacist remain involved and have appointments with patient tomorrow on 06/07/23 at 10:30 am and 11:15 am.     Current Barriers:  Financial constraints Mental Health Concerns  Social Isolation Limited education about mental health support resources that are available to her within the local area* Cognitive Deficits Lacks knowledge of community resource   Clinical Social Work Clinical Goal(s):  Over the next 90 days, client will work with SW to address concerns related to experiencing ongoing symptoms of depression, sadness and grief since the passing of her mother Over the next 90 days, patient will work with LCSW to address needs related to implementing appropriate self-care and depression management.    Managing Loss, Adult People experience loss in many different ways throughout their lives. Events such as moving, changing jobs, and losing friends can create a sense of loss. The loss may be as serious as a major health change, divorce, death of a pet, or death of a loved one. All of these types of loss are likely to create a physical and emotional reaction known as grief. Grief is the result of a major change or an absence of something or someone that you count on. Grief is a normal reaction to loss. A variety of factors can affect your grieving experience, including: The nature of your loss. Your relationship to what or whom you lost. Your understanding of grief and how to manage it.  Your support system. How to manage lifestyle changes Keep to your normal routine as much as possible. If you have trouble focusing or doing normal activities, it is acceptable to take some time away from your normal routine. Spend time with friends and loved ones. Eat a healthy diet, get plenty of sleep, and rest when you feel tired. How to recognize changes  The way that you deal with your grief will affect your ability to function as you normally do. When  grieving, you may experience these changes: Numbness, shock, sadness, anxiety, anger, denial, and guilt. Thoughts about death. Unexpected crying. A physical sensation of emptiness in your stomach. Problems sleeping and eating. Tiredness (fatigue). Loss of interest in normal activities. Dreaming about or imagining seeing the person who died. A need to remember what or whom you lost. Difficulty thinking about anything other than your loss for a period of time. Relief. If you have been expecting the loss for a while, you may feel a sense of relief when it happens. Follow these instructions at home:    Activity Express your feelings in healthy ways, such as: Talking with others about your loss. It may be helpful to find others who have had a similar loss, such as a support group. Writing down your feelings in a journal. Doing physical activities to release stress and emotional energy. Doing creative activities like painting, sculpting, or playing or listening to music. Practicing resilience. This is the ability to recover and adjust after facing challenges. Reading some resources that encourage resilience may help you to learn ways to practice those behaviors. General instructions Be patient with yourself and others. Allow the grieving process to happen, and remember that grieving takes time. It is likely that you may never feel completely done with some grief. You may find a way to move on while still cherishing memories and feelings about your loss. Accepting your loss is a process. It can take months or longer to adjust. Keep all follow-up visits as told by your health care provider. This is important. Where to find support To get support for managing loss: Ask your health care provider for help and recommendations, such as grief counseling or therapy. Think about joining a support group for people who are managing a loss. Where to find more information You can find more information  about managing loss from: American Society of Clinical Oncology: www.cancer.net American Psychological Association: DiceTournament.ca Contact a health care provider if: Your grief is extreme and keeps getting worse. You have ongoing grief that does not improve. Your body shows symptoms of grief, such as illness. You feel depressed, anxious, or lonely. Get help right away if: You have thoughts about hurting yourself or others. If you ever feel like you may hurt yourself or others, or have thoughts about taking your own life, get help right away. You can go to your nearest emergency department or call: Your local emergency services (911 in the [place] A suicide crisis helpline, such as the National Suicide Prevention Lifeline at 479 203 1277. This is open 24 hours a day. Summary Grief is the result of a major change or an absence of someone or something that you count on. Grief is a normal reaction to loss. The depth of grief and the period of recovery depend on the type of loss and your ability to adjust to the change and process your feelings. Processing grief requires patience and a willingness to accept your feelings and talk about your loss with people who are supportive.  It is important to find resources that work for you and to realize that people experience grief differently. There is not one grieving process that works for everyone in the same way. Be aware that when grief becomes extreme, it can lead to more severe issues like isolation, depression, anxiety, or suicidal thoughts. Talk with your health care provider if you have any of these issues. This information is not intended to replace advice given to you by your health care provider. Make sure you discuss any questions you have with your health care provider. Document Revised: 06/17/2018 Document Reviewed: 08/27/2016 Elsevier Patient Education  2020 [employer].  Help for Managing Grief   When you are experiencing grief, many  resources and support are available to help you. You are not alone.    Grief Share ([email or web address]):    [employer] of [address] [place], [place]      DIRECTV  [phone] [address] [place], [place]     [facility] [facility] [address] [place], [place]      [facility] [address] [place], [place]  [phone]   [facility] [address] [place], [place] [phone]   [facility] [address] [place], [place]     [phone]   [facility] [address] [place], [place]     [phone]   If a Hospice or Palliative Care team has been involved in the care of your loved one, you may reach out to your contact there or locally, you may reach out to [facility] of [facility]:    [facility] and Pall[facility] of [facility]   [address] [place], [place] [address]  Patient Self Care Activities:  Attends all scheduled provider appointments Calls provider office for new concerns or questions  Please see past updates related to this goal by clicking on the "Past Updates" button in the selected goal   Follow up goal     24- Hour Availability:    [facility]  [address] [place], [place] [phone] Crisis [phone]   [facility] [phone]  [place] [facility]  [phone]    [facility]  [phone] (after hours)   Therapeutic Alternative/Mobile Crisis   [phone]) [phone]   [place]  National Suicide Hotline  631-844-9439 Derrel Flies) OR 988   Call 988 for mental health emergencies   [facility]  [phone]);  [place] [employer]  3654652905); [place], [place], [place], [place], [place], [place], [place]    [facility] for [facility] Residents For 24/7 walk-up access to  mental health services for [facility] children (4+), adolescents and adults, please visit the [facility] located at [address] in [place], [place].  *[facility] also provides comprehensive outpatient behavioral health services in a variety of locations around the [place].  Connect With Us  [address] [place], [place] [address] HelpLine: (276)277-0620 or 1-6166793247  Get Directions  Find Help 24/7 By Phone Call our 24-hour HelpLine at (770)800-3741 or 312-477-6258 for immediate assistance for mental health and substance abuse issues.  Walk-In Help [place]: [facility] (Ages 4 and Up) [place]: Emergency Dept., [facility] Additional Resources National Hopeline Network: 1-800-SUICIDE The National Suicide Prevention Lifeline: 5-400-867-YPPJ     Kolleen Perone, BSW, MSW, LCSW  Licensed Clinical Child psychotherapist [employer]   [facility] [email or web address] Direct Dial: [phone]

## 2023-06-08 ENCOUNTER — Ambulatory Visit: Payer: Medicaid Other | Admitting: Nurse Practitioner

## 2023-06-08 ENCOUNTER — Encounter: Payer: Self-pay | Admitting: Nurse Practitioner

## 2023-06-08 ENCOUNTER — Other Ambulatory Visit: Payer: Self-pay

## 2023-06-08 ENCOUNTER — Other Ambulatory Visit: Payer: Self-pay | Admitting: Obstetrics and Gynecology

## 2023-06-08 VITALS — BP 129/71 | HR 72 | Ht 62.0 in | Wt 322.2 lb

## 2023-06-08 DIAGNOSIS — I10 Essential (primary) hypertension: Secondary | ICD-10-CM | POA: Diagnosis not present

## 2023-06-08 DIAGNOSIS — J449 Chronic obstructive pulmonary disease, unspecified: Secondary | ICD-10-CM | POA: Diagnosis not present

## 2023-06-08 DIAGNOSIS — Z7985 Long-term (current) use of injectable non-insulin antidiabetic drugs: Secondary | ICD-10-CM | POA: Diagnosis not present

## 2023-06-08 DIAGNOSIS — E1142 Type 2 diabetes mellitus with diabetic polyneuropathy: Secondary | ICD-10-CM

## 2023-06-08 DIAGNOSIS — G629 Polyneuropathy, unspecified: Secondary | ICD-10-CM

## 2023-06-08 MED ORDER — OMEPRAZOLE 20 MG PO CPDR: 20.0000 mg | DELAYED_RELEASE_CAPSULE | Freq: Every day | ORAL | Status: DC

## 2023-06-08 MED ORDER — ONDANSETRON 4 MG PO TBDP: 4.0000 mg | ORAL_TABLET | Freq: Four times a day (QID) | ORAL | Status: DC | PRN

## 2023-06-08 MED ORDER — METHOCARBAMOL 500 MG PO TABS: 500.0000 mg | ORAL_TABLET | Freq: Two times a day (BID) | ORAL | 1 refills | Status: DC

## 2023-06-08 NOTE — Patient Instructions (Signed)
Visit Information  Ms. Rempel was given information about Medicaid Managed Care team care coordination services as a part of their [facility] Medicaid benefit. Stefany Ladine Kiper verbally consented to engagement with the [facility] Managed Care team.   If you are experiencing a medical emergency, please call 911 or report to your local emergency department or urgent care.   If you have a non-emergency medical problem during routine business hours, please contact your provider's office and ask to speak with a nurse.   For questions related to your [facility] health plan, please call: [phone] or go here:[email or web address]  If you would like to schedule transportation through your [facility] plan, please call the following number at least 2 days in advance of your appointment: [phone].   You can also use the MTM portal or MTM mobile app to manage your rides. Reimbursement for transportation is available through [facility]! For the portal, please go to [email or web address]  Call the [facility] Crisis Line at [phone], at any time, 24 hours a day, 7 days a week. If you are in danger or need immediate medical attention call 911.  If you would like help to quit smoking, call 1-800-QUIT-NOW (608-581-2994) OR Espaol: 1-855-Djelo-Ya (2-725-366-4403) o para ms informacin haga clic aqu or Text READY to 474-259 to register via text  Ms. Stemen - following are the goals we discussed in your visit today:   Goals Addressed             This Visit's Progress    RNCM: Keep Skin Clean and Dry       Follow Up Date: 07/20/23  - clean and dry skin well - keep skin dry - use a fragrance-free lotion on skin - wear a protective pad or garment    Why is this important?   Leaking urine (pee) can cause soreness from skin rashes and redness.  This is caused by skin being exposed to urine  06/08/23:  Patient now receiving supplies from [employer].      RNCM: Track and Manage My Symptoms-Depression       Timeframe:  Long-Range Goal Priority:  High Start Date:    12-02-2020                         Expected End Date:   ongoing                Follow Up Date: 07/20/23   - avoid negative self-talk - develop a personal safety plan - develop a plan to deal with triggers like holidays, anniversaries - exercise at least 2 to 3 times per week - have a plan for how to handle bad days - journal feelings and what helps to feel better or worse - spend time or talk with others at least 2 to 3 times per week - spend time or talk with others every day - watch for early signs of feeling worse    Why is this important?   Keeping track of your progress will help your treatment team find the right mix of medicine and therapy for you.  Write in your journal every day.  Day-to-day changes in depression symptoms are normal. It may be more helpful to check your progress at the end of each week instead of every day.   06/08/23:  Followed by LCSW-has been given resources and declines therapy services.     RNCM: Track and Manage My Triggers-COPD       Timeframe:  Long-Range Goal Priority:  Medium Start Date:     05-22-2020                        Expected End Date:   ongoing          Follow Up Date: 07/20/23   - avoid second hand smoke - eliminate smoking in my home - identify and avoid work-related triggers - identify and remove indoor air pollutants - limit outdoor activity during cold weather - listen for public air quality announcements every day    Why is this important?   Triggers are activities or things, like tobacco smoke or cold weather, that make your COPD (chronic obstructive pulmonary disease) flare-up.  Knowing these triggers helps you plan how to stay away from them.  When you cannot remove them, you can learn how to manage them.   06/08/23:  Followed by Pulm, Oxygen 3L   The patient verbalized understanding of instructions, educational  materials, and care plan provided today and DECLINED offer to receive copy of patient instructions, educational materials, and care plan.   The Managed Medicaid care management team will reach out to the patient again over the next 45 business  days.  The  Patient has been provided with contact information for the Managed Medicaid care management team and has been advised to call with any health related questions or concerns.   Kathi Der RN, BSN, [employer] [facility] RN Care Manager Direct Dial [phone]/BMW [phone] Website: [email or web address]   Following is a copy of your plan of care:  Care Plan : RNCM: COPD (Adult)  Updates made by [name], RN since 06/08/2023 12:00 AM     Problem: RNCM: Psychological Adjustment to Diagnosis (COPD)   Priority: Medium  Onset Date: 05/22/2020     Long-Range Goal: RNCM: Adjustment to Disease Achieved   Start Date: 05/22/2020  Expected End Date: 06/02/2023  Recent Progress: On track  Priority: High  Note:   Current Barriers:  Knowledge deficits related to basic understanding of COPD disease process Knowledge deficits related to basic COPD self care/management Knowledge deficit related to basic understanding of how to use inhalers and how inhaled medications work Knowledge deficit related to importance of energy conservation Limited Social Support Unable to independently manage COPD Lacks social connections Does not contact provider office for questions/concerns  06/08/23:  On 3L continuous Oxygen-has PULM appt next month and PCP appt today.  Patient c/o leg pain and will follow up with PCP today.  Getting DME from [employer].  Food resources through [employer] and food stamps.  Has eye appt upcoming-to check on GYN appt.    Case Manager Clinical Goal(s): patient will report using inhalers as prescribed including rinsing mouth after use patient will be able to verbalize understanding of COPD action plan  and when to seek appropriate levels of medical care  patient will engage in lite exercise as tolerated to build/regain stamina and strength and reduce shortness of breath through activity tolerance  patient will verbalize basic understanding of COPD disease process and self care activities   Interventions:  Inter-disciplinary care team collaboration (see longitudinal plan of care) UNABLE to independently: manage COPD Provided patient with basic written and verbal COPD education on self care/management/and exacerbation prevention Provided patient with COPD action plan and reinforced importance of daily self assessment.  Provided written and verbal instructions on pursed lip breathing and utilized returned demonstration as teach back Provided instruction about  proper use of medications used for management of COPD including inhalers Advised patient to self assesses COPD action plan zone and make appointment with provider if in the yellow zone for 48 hours without improvement. Provided patient with education about the role of exercise in the management of COPD Advised patient to engage in light exercise as tolerated 3-5 days a week Provided education about and advised patient to utilize infection prevention strategies to reduce risk of respiratory infection.  Patient Goals/Self-Care Activities:  - decision-making supported - depression screen reviewed - emotional support provided - family involvement promoted - problem-solving facilitated - relaxation techniques promoted - verbalization of feelings encouraged  Follow Up Plan: Telephone follow up appointment with care management team member scheduled.     Care Plan : RNCM: Hypertension (Adult)  Updates made by [name], RN since 06/08/2023 12:00 AM     Problem: RNCM: Hypertension (Hypertension)   Priority: Medium  Onset Date: 05/22/2020     Long-Range Goal: RNCM: Hypertension Monitored   Start Date: 05/22/2020  Expected End  Date: 06/02/2023  Recent Progress: On track  Priority: Medium  Note:    Current Barriers:  Knowledge Deficits related to basic understanding of hypertension pathophysiology and self care management Knowledge Deficits related to understanding of medications prescribed for management of hypertension Limited Social Support Unable to independently manage HTN Lacks social connections Does not contact provider office for questions/concern 06/08/23:  Checks BP every day and is stable-has f/u with PCP today.  112/85 today at home.   Case Manager Clinical Goal(s):   patient will verbalize understanding of plan for hypertension management patient will demonstrate improved adherence to prescribed treatment plan for hypertension as evidenced by taking all medications as prescribed, monitoring and recording blood pressure as directed, adhering to low sodium/DASH diet patient will demonstrate improved health management independence as evidenced by checking blood pressure as directed and notifying PCP if SBP>160 or DBP > 90, taking all medications as prescribe, and adhering to a low sodium diet as discussed.  Interventions:  Inter-disciplinary care team collaboration (see longitudinal plan of care) Evaluation of current treatment plan related to hypertension self management and patient's adherence to plan as established by provider.  Provided education to patient re: stroke prevention, s/s of heart attack and stroke, DASH diet, complications of uncontrolled blood pressure Reviewed medications with patient and discussed importance of compliance.  Discussed plans with patient for ongoing care management follow up and provided patient with direct contact information for care management team Advised patient, providing education and rationale, to monitor blood pressure daily and record, calling PCP for findings outside established parameters.   Patient Goals/Self-Care Activities Over the next 120 days, patient  will:  - Self administers medications as prescribed Attends all scheduled provider appointments Calls provider office for new concerns, questions, or BP outside discussed parameters Checks BP and records as discussed Follows a low sodium diet/DASH diet - blood pressure trends reviewed - depression screen reviewed - home or ambulatory blood pressure monitoring encouraged  Follow Up Plan: Telephone follow up appointment with care management team member scheduled.      Care Plan : RNCM: Depression (Adult)  Updates made by [name], RN since 06/08/2023 12:00 AM     Problem: RNCM: Symptoms (Depression)   Priority: High  Onset Date: 12/02/2020     Long-Range Goal: RNCM: Symptoms Monitored and Managed   Start Date: 12/02/2020  Expected End Date: 09/05/2023  Recent Progress: Not on track  Priority: High  Note:  Current Barriers:  Ineffective Self Health Maintenance in a patient with Anxiety and Depression Unable to independently manage depression and anxiety Does not attend all scheduled provider appointments Lacks social connections Unable to perform IADLs independently Does not contact provider office for questions/concerns 06/08/23:  Declines services, LCSW given resources and following  Clinical Goal(s):  Collaboration with [name], NP regarding development and update of comprehensive plan of care as evidenced by provider attestation and co-signature Inter-disciplinary care team collaboration (see longitudinal plan of care) patient will work with care management team to address care coordination and chronic disease management needs related to Disease Management Educational Needs Care Coordination Mental Health Counseling Level of Care Concerns    Interventions:  Evaluation of current treatment plan related to Anxiety and Depression, Financial constraints related to insurance not approving care for the patient to go to wound care , Limited social support, Level of  care concerns, ADL IADL limitations, Mental Health Concerns , Family and relationship dysfunction, Substance abuse issues -   , and Inability to perform IADL's independently self-management and patient's adherence to plan as established by provider. Inter-disciplinary care team collaboration (see longitudinal plan of care) Discussed plans with patient for ongoing care management follow up and provided patient with direct contact information for care management team Evaluation of depression and anxiety.  LCSW referral for increased stress-completed Collaborated with LCSW   Self Care Activities:  Patient verbalizes understanding of plan to effectively manage depression and anxiety Self administers medications as prescribed Attends all scheduled provider appointments Calls pharmacy for medication refills Attends church or other social activities Performs ADL's independently Performs IADL's independently Calls provider office for new concerns or questions Work with the [facility] team to meet needs for continued CCM services   Patient Goals: - activity or exercise based on tolerance encouraged - depression screen reviewed - emotional support provided - healthy lifestyle promoted - medication side effects monitored and managed - pain managed - participation in mental health treatment encouraged - quality of sleep assessed - response to mental health treatment monitored - response to pharmacologic therapy monitored - sleep hygiene techniques encouraged - social activities and relationships encouraged - substance use assessed Follow Up Plan: The care management team will reach out to the patient again over the next 90 business days.

## 2023-06-08 NOTE — Assessment & Plan Note (Signed)
Chronic.  Controlled.  Continue with current medication regimen of Trulicity.  Continues to have financial troubles.  Working with pharmacist to get medications changed to [facility] to help with ability to get medications. Labs ordered today.  Return to clinic in 3 months for reevaluation.  Call sooner if concerns arise.

## 2023-06-08 NOTE — Patient Outreach (Signed)
Medicaid Managed Care   Nurse Care Manager Note  06/08/2023 Name:  Cheryl Chandler MRN:  [id number] DOB:  1965/08/05  Cheryl Chandler is an 58 y.o. year old female who is a primary patient of Cheryl Grooms, NP.  The [facility] Managed Care Coordination team was consulted for assistance with:    Chronic healthcare management needs, OSA, GERD, arthritis, HLD, HTN, DM, anxiety/depression, COPD, lymphedema, neuropathy, obesity  [name] was given information about Medicaid Managed Care Coordination team services today. Cheryl Chandler Patient agreed to services and verbal consent obtained.  Engaged with patient by telephone for follow up visit in response to provider referral for case management and/or care coordination services.   Patient is participating in a Managed Medicaid Plan:  Yes  Assessments/Interventions:  Review of past medical history, allergies, medications, health status, including review of consultants reports, laboratory and other test data, was performed as part of comprehensive evaluation and provision of chronic care management services.  SDOH (Social Drivers of Health) assessments and interventions performed: [name]    Flowsheet Row Patient Outreach Telephone from 06/08/2023 in [place] POPULATION HEALTH DEPARTMENT Most recent reading at 06/08/2023 10:42 AM Patient Outreach Telephone from 06/07/2023 in [place] POPULATION HEALTH DEPARTMENT Most recent reading at 06/07/2023  2:47 PM Patient Outreach Telephone from 05/04/2023 in [place] POPULATION HEALTH DEPARTMENT Most recent reading at 05/04/2023 11:48 AM Patient Outreach Telephone from 05/04/2023 in [place] POPULATION HEALTH DEPARTMENT Most recent reading at 05/04/2023  9:18 AM Patient Outreach Telephone from 04/02/2023 in [place] POPULATION HEALTH DEPARTMENT Most recent reading at 04/02/2023 12:54 PM Patient Outreach Telephone from 04/01/2023 in [place] POPULATION HEALTH  DEPARTMENT Most recent reading at 04/01/2023  2:44 PM  [name]        Food Insecurity Interventions Other (Comment)  [has been given food resources] -- -- -- -- --  Housing Interventions -- -- -- Intervention Not Indicated -- --  Transportation Interventions -- -- -- Intervention Not Indicated -- --  Utilities Interventions -- -- -- -- -- Intervention Not Indicated  Alcohol Usage Interventions -- -- -- -- Intervention Not Indicated (Score <7) --  Physical Activity Interventions Intervention Not Indicated, Other (Comments)  [cannot engage in this type of exercise] -- -- -- -- --  Stress Interventions -- Provide Counseling, [employer] Provided, Other (Comment)  [Pt had a recent fall and health issues that have increased her overal stress but pt continues to decline [facility] treatment. [facility] LCSW provided [employer] resource education again during outreach today.] [employer] Provided, Provide Counseling -- -- --  Social Connections Interventions -- -- -- -- Intervention Not Indicated --  Health Literacy Interventions -- -- -- -- -- Intervention Not Indicated     Care Plan Allergies  Allergen Reactions   Codeine Shortness Of Breath and Rash   Percocet [Oxycodone-Acetaminophen] Shortness Of Breath   Sulfa Antibiotics Shortness Of Breath and Rash   Aspirin Hives   Bee Venom     Bee stings   Darvon [Propoxyphene]     Darvocet-N 100   Latex    Oxycodone Nausea And Vomiting   Penicillins     Has patient had a PCN reaction causing immediate rash, facial/tongue/throat swelling, SOB or lightheadedness with hypotension: Unknown Has patient had a PCN reaction causing severe rash involving mucus membranes or skin necrosis: Unknown Has patient had a PCN reaction that required hospitalization: Unknown Has patient had a PCN reaction occurring within the last 10 years: Unknown If all of  the above answers are "NO", then may proceed with Cephalosporin use.   Medications Reviewed Today      Reviewed by Cheryl Chandler (Registered Nurse) on 06/08/23 at 1045  Med List Status: <None>   Medication Order Taking? Sig Documenting Provider Last Dose Status Informant  ACCU-CHEK GUIDE test strip [id number] No USE TO CHECK BLOOD SUGAR 3 TIMES DAILY AS DIRECTED Cheryl Grooms, NP Taking Active Self, Other  Accu-Chek Softclix Lancets lancets [id number] No CHECK BLOOD SUGAR TWICE DAILY Cheryl Grooms, NP Taking Active Self, Other  albuterol (PROVENTIL) (2.5 MG/3ML) 0.083% nebulizer solution [id number]  USE 1 VIAL VIA NEBULIZER EVERY 4 HOURS AS NEEDED Cheryl Grooms, NP  Active   atorvastatin (LIPITOR) 80 MG tablet [id number] No Take 1 tablet (80 mg total) by mouth daily. Chandler, Cheryl Chandler Taking Active Self, Other  Blood Glucose Monitoring Suppl (ACCU-CHEK GUIDE) w/Device KIT [id number] No Use to check blood sugar 3 times a day. Aura Dials T, NP Taking Active Self, Other  Blood Pressure Monitoring (OMRON 3 SERIES BP MONITOR) DEVI [id number] No Use to check blood pressure as directed Cheryl Grooms, NP Taking Active Self, Other  citalopram (CELEXA) 40 MG tablet [id number] No Take 1 tablet (40 mg total) by mouth daily. Chandler, Cheryl Chandler Taking Active Self, Other  diphenhydrAMINE (BENADRYL) 25 mg capsule [id number] No Take 25 mg by mouth every 6 (six) hours as needed for itching. Taking once a day [provider] Taking Active Self, Other  docusate sodium (COLACE) 100 MG capsule [id number] No TAKE 1 CAPSULE BY MOUTH TWICE DAILY Cheryl Grooms, NP Taking Active   Dulaglutide (TRULICITY) 0.75 MG/0.5ML SOPN [id number] No 0.75 mg by ultrasound guided injection route once a week. Chandler, Cheryl Chandler Taking Active Self, Other  EPINEPHrine 0.3 mg/0.3 mL IJ SOAJ injection [id number] No INJECT 1 SYRINGE INTO OUTER THIGH ONCE AS NEEDED FOR SEVERE ALLERGIC REACTION. Chandler, Cheryl Chandler Taking Active Self, Other  Fluticasone-Umeclidin-Vilant (TRELEGY ELLIPTA) 100-62.5-25 MCG/ACT AEPB  [id number] No Inhale 1 puff into the lungs daily. Cheryl Saner, MD Taking Active Self, Other  gabapentin (NEURONTIN) 300 MG capsule [id number] No TAKE 1 CAPSULE BY MOUTH 3 TIMES DAILY ASNEEDED (NEED OFFICE VISIT FOR FURTHER REFILLS) Cheryl Grooms, NP Taking Active   hydrALAZINE (APRESOLINE) 50 MG tablet [id number] No Take 50 mg by mouth 3 (three) times daily. [provider] Taking Active   hydrOXYzine (VISTARIL) 25 MG capsule [id number] No Take 1 capsule (25 mg total) by mouth every 8 (eight) hours as needed. Chandler, Cheryl Chandler Taking Active Self, Other  levothyroxine (SYNTHROID) 50 MCG tablet [id number] No TAKE 1 TABLET BY MOUTH ONCE DAILY ON AN EMPTY STOMACH. WAIT 30 MINUTES BEFORE TAKING OTHER MEDS. Chandler, Cheryl Chandler Taking Active Self, Other  loperamide (IMODIUM A-D) 2 MG tablet [id number] No Take 1 tablet (2 mg total) by mouth 4 (four) times daily as needed for diarrhea or loose stools. Wouk, Wilfred Curtis, MD Taking Active   meloxicam [facility]) 15 MG tablet [id number] No TAKE 1 TABLET BY MOUTH ONCE DAILY AS NEEDED WITH FOOD OR MILK Cheryl Grooms, NP Taking Active Self, Other           Med Note Littie Deeds, CHERYL A   Fri Aug 07, 2022  9:13 AM) Taking daily  montelukast (SINGULAIR) 10 MG tablet [id number] No TAKE 1 TABLET BY MOUTH AT BEDTIME Cheryl Grooms, NP Taking Active Self, Other  nitroGLYCERIN (NITROSTAT) 0.4 MG SL tablet [id number] No DISSOLVE 1 TABLET UNDER TONGUE  AT ONSET OF CHEST PAIN. REPEAT IN 5 MIN IF NOT RESOLVED, MAX 3 DOSES. 911 IF NEEDED. Cheryl Grooms, NP Taking Active Self, Other  omeprazole (PRILOSEC) 20 MG capsule [id number] No TAKE 1 CAPSULE BY MOUTH ONCE DAILY Cheryl Grooms, NP Taking Active Self, Other  ondansetron (ZOFRAN-ODT) 4 MG disintegrating tablet [id number]  Take 1 tablet (4 mg total) by mouth every 6 (six) hours as needed for nausea or vomiting. Cheryl Grooms, NP  Active   OXYGEN [id number] No Inhale 3 L into the lungs daily. [provider] Taking Active Self, Other  Silver (AQUACEL AG FOAM) 8"X8" PADS [id number] No Apply to areas of ulceration on legs as directed Sunnie Nielsen, DO Taking Active   Vitamin D, Ergocalciferol, (DRISDOL) 1.25 MG (50000 UNIT) CAPS capsule [id number] No TAKE 1 CAPSULE BY MOUTH EVERY 7 DAYS Cheryl Grooms, NP Taking Active Self, Other  Wound Cleansers (VASHE WOUND) 0.033 % SOLN [id number] No Apply 1 Application topically daily. 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   Gastroenteritis due to norovirus 05/01/2023   Hypothyroidism 05/01/2023   T2DM (type 2 diabetes mellitus) (HCC) 05/01/2023   Dermatitis 02/02/2023   Cellulitis 02/01/2023   Venous ulcer of ankle, left (HCC) 11/16/2022   Moderate episode of recurrent major depressive disorder (HCC) 08/03/2022   Controlled type 2 diabetes mellitus with diabetic polyneuropathy, without long-term current use of insulin (HCC) 08/03/2022   Venous ulcer (HCC) 05/03/2022   Foot pain, right 09/01/2021   Fibrocystic disease of both breasts 05/07/2019   Chronic venous insufficiency 11/09/2018   Overactive bladder 09/30/2018   Peripheral neuropathy 09/30/2018   Apnea 01/02/2016   Lymphedema 11/30/2014   Dyspnea on exertion 11/30/2014   Anxiety 11/29/2014   Arthritis 11/29/2014   COPD (chronic obstructive pulmonary disease) (HCC) 11/29/2014   Clinical depression 11/29/2014   H/O eating disorder 11/29/2014   Esophageal reflux 11/29/2014   Acne inversa 11/29/2014   Essential hypertension 11/29/2014   HLD (hyperlipidemia) 11/29/2014   Adult hypothyroidism 11/29/2014   Insomnia 11/29/2014   Low back pain 11/29/2014   Morbid obesity (HCC) 11/29/2014    Posttraumatic stress disorder 11/29/2014   Vitamin D deficiency 11/29/2014   Nicotine dependence, cigarettes, uncomplicated 11/29/2014   Conditions to be addressed/monitored per PCP order:  Chronic healthcare management needs, OSA, GERD, arthritis, HLD, HTN, DM, anxiety/depression, COPD, lymphedema, neuropathy, obesity  Care Plan : RNCM: COPD (Adult)  Updates made by Cheryl Chandler since 06/08/2023 12:00 AM     Problem: RNCM: Psychological Adjustment to Diagnosis (COPD)   Priority: Medium  Onset Date: 05/22/2020     Long-Range Goal: RNCM: Adjustment to Disease Achieved   Start Date: 05/22/2020  Expected End Date: 06/02/2023  Recent Progress: On track  Priority: High  Note:   Current Barriers:  Knowledge deficits related to basic understanding of COPD disease process Knowledge deficits related to basic COPD self care/management Knowledge deficit related to basic understanding of how to use inhalers and how inhaled medications work Knowledge deficit related to importance of energy conservation Limited Social Support Unable to independently manage COPD Lacks social connections Does not contact provider office for questions/concerns  06/08/23:  On 3L continuous Oxygen-has  PULM appt next month and PCP appt today.  Patient c/o leg pain and will follow up with PCP today.  Getting DME from [employer].  Food resources through [employer] and food stamps.  Has eye appt upcoming-to check on GYN appt.    Case Manager Clinical Goal(s): patient will report using inhalers as prescribed including rinsing mouth after use patient will be able to verbalize understanding of COPD action plan and when to seek appropriate levels of medical care  patient will engage in lite exercise as tolerated to build/regain stamina and strength and reduce shortness of breath through activity tolerance  patient will verbalize basic understanding of COPD disease process and self care activities   Interventions:   Inter-disciplinary care team collaboration (see longitudinal plan of care) UNABLE to independently: manage COPD Provided patient with basic written and verbal COPD education on self care/management/and exacerbation prevention Provided patient with COPD action plan and reinforced importance of daily self assessment.  Provided written and verbal instructions on pursed lip breathing and utilized returned demonstration as teach back Provided instruction about proper use of medications used for management of COPD including inhalers Advised patient to self assesses COPD action plan zone and make appointment with provider if in the yellow zone for 48 hours without improvement. Provided patient with education about the role of exercise in the management of COPD Advised patient to engage in light exercise as tolerated 3-5 days a week Provided education about and advised patient to utilize infection prevention strategies to reduce risk of respiratory infection.  Patient Goals/Self-Care Activities:  - decision-making supported - depression screen reviewed - emotional support provided - family involvement promoted - problem-solving facilitated - relaxation techniques promoted - verbalization of feelings encouraged  Follow Up Plan: Telephone follow up appointment with care management team member scheduled.   Care Plan : RNCM: Hypertension (Adult)  Updates made by Cheryl Chandler since 06/08/2023 12:00 AM     Problem: RNCM: Hypertension (Hypertension)   Priority: Medium  Onset Date: 05/22/2020     Long-Range Goal: RNCM: Hypertension Monitored   Start Date: 05/22/2020  Expected End Date: 06/02/2023  Recent Progress: On track  Priority: Medium  Note:    Current Barriers:  Knowledge Deficits related to basic understanding of hypertension pathophysiology and self care management Knowledge Deficits related to understanding of medications prescribed for management of hypertension Limited Social  Support Unable to independently manage HTN Lacks social connections Does not contact provider office for questions/concern 06/08/23:  Checks BP every day and is stable-has f/u with PCP today.  112/85 today at home.   Case Manager Clinical Goal(s):   patient will verbalize understanding of plan for hypertension management patient will demonstrate improved adherence to prescribed treatment plan for hypertension as evidenced by taking all medications as prescribed, monitoring and recording blood pressure as directed, adhering to low sodium/DASH diet patient will demonstrate improved health management independence as evidenced by checking blood pressure as directed and notifying PCP if SBP>160 or DBP > 90, taking all medications as prescribe, and adhering to a low sodium diet as discussed.  Interventions:  Inter-disciplinary care team collaboration (see longitudinal plan of care) Evaluation of current treatment plan related to hypertension self management and patient's adherence to plan as established by provider.  Provided education to patient re: stroke prevention, s/s of heart attack and stroke, DASH diet, complications of uncontrolled blood pressure Reviewed medications with patient and discussed importance of compliance.  Discussed plans with patient for ongoing care management follow  up and provided patient with direct contact information for care management team Advised patient, providing education and rationale, to monitor blood pressure daily and record, calling PCP for findings outside established parameters.   Patient Goals/Self-Care Activities Over the next 120 days, patient will:  - Self administers medications as prescribed Attends all scheduled provider appointments Calls provider office for new concerns, questions, or BP outside discussed parameters Checks BP and records as discussed Follows a low sodium diet/DASH diet - blood pressure trends reviewed - depression screen  reviewed - home or ambulatory blood pressure monitoring encouraged  Follow Up Plan: Telephone follow up appointment with care management team member scheduled.     Care Plan : RNCM: Depression (Adult)  Updates made by Cheryl Chandler since 06/08/2023 12:00 AM     Problem: RNCM: Symptoms (Depression)   Priority: High  Onset Date: 12/02/2020     Long-Range Goal: RNCM: Symptoms Monitored and Managed   Start Date: 12/02/2020  Expected End Date: 09/05/2023  Recent Progress: Not on track  Priority: High  Note:   Current Barriers:  Ineffective Self Health Maintenance in a patient with Anxiety and Depression Unable to independently manage depression and anxiety Does not attend all scheduled provider appointments Lacks social connections Unable to perform IADLs independently Does not contact provider office for questions/concerns 06/08/23:  Declines services, LCSW given resources and following  Clinical Goal(s):  Collaboration with Cheryl Grooms, NP regarding development and update of comprehensive plan of care as evidenced by provider attestation and co-signature Inter-disciplinary care team collaboration (see longitudinal plan of care) patient will work with care management team to address care coordination and chronic disease management needs related to Disease Management Educational Needs Care Coordination Mental Health Counseling Level of Care Concerns    Interventions:  Evaluation of current treatment plan related to Anxiety and Depression, Financial constraints related to insurance not approving care for the patient to go to wound care , Limited social support, Level of care concerns, ADL IADL limitations, Mental Health Concerns , Family and relationship dysfunction, Substance abuse issues -   , and Inability to perform IADL's independently self-management and patient's adherence to plan as established by provider. Inter-disciplinary care team collaboration (see longitudinal  plan of care) Discussed plans with patient for ongoing care management follow up and provided patient with direct contact information for care management team Evaluation of depression and anxiety.  LCSW referral for increased stress-completed Collaborated with LCSW   Self Care Activities:  Patient verbalizes understanding of plan to effectively manage depression and anxiety Self administers medications as prescribed Attends all scheduled provider appointments Calls pharmacy for medication refills Attends church or other social activities Performs ADL's independently Performs IADL's independently Calls provider office for new concerns or questions Work with the [facility] team to meet needs for continued CCM services   Patient Goals: - activity or exercise based on tolerance encouraged - depression screen reviewed - emotional support provided - healthy lifestyle promoted - medication side effects monitored and managed - pain managed - participation in mental health treatment encouraged - quality of sleep assessed - response to mental health treatment monitored - response to pharmacologic therapy monitored - sleep hygiene techniques encouraged - social activities and relationships encouraged - substance use assessed Follow Up Plan: The care management team will reach out to the patient again over the next 90 business days.   Follow Up:  Patient agrees to Care Plan and Follow-up.  Plan: The Managed Medicaid care management team will reach out  to the patient again over the next 45 business  days. and The  Patient has been provided with contact information for the Managed Medicaid care management team and has been advised to call with any health related questions or concerns.  Date/time of next scheduled Chandler care management/care coordination outreach:  07/20/23 at 0900.

## 2023-06-08 NOTE — Assessment & Plan Note (Signed)
Patient is on Oxygen at home- 3L.  Followed by Pulmonology.  Has not been able to get portable oxygen.  Has follow up with Pulmonology next month.

## 2023-06-08 NOTE — Progress Notes (Signed)
06/08/2023 Name: Cheryl Chandler MRN: [id number] DOB: 1965-12-27  Chief Complaint  Patient presents with   Medication Assistance   Cheryl Chandler is a 58 y.o. year old female who presented for a telephone visit.   They were referred to the pharmacist by their Case Management Team  for assistance in managing complex medication management.   Subjective:  Care Team: Primary Care Provider: [name], NP ; Next Scheduled Visit: 06/09/23 Pulmonology- Sarina Ser 07/02/23  Medication Access/Adherence Current Pharmacy:  [facility] - [place], [place] - [address] [place] [address] Phone: [phone] Fax: [phone]  -Patient reports affordability concerns with their medications: Yes -patient is not always able to get all medications, because $4 co-pays on Medicaid add up when needing to refill everything.  [facility] does allow them a charge account, but no additional charges are able to be made to the account at this time. -Patient reports access/transportation concerns to their pharmacy: Yes -patient and husband share a vehicle that has recently been unreliable. -Patient reports adherence concerns with their medications:  Yes -patient is needing refills of all medications at this time, and she has been without omeprazole, montelukast, nitroglycerin, and her EpiPen.  She is also requesting refills of ondansetron and docusate to be taken as needed.  Diabetes: Current medications: Trulicity 0.75 mg weekly -Patient is using Accu-Chek guide testing supplies to check home blood glucose but does not provide any recent values -Most recent A1c is 5.8% -Patient is prescribed gabapentin 300mg  TID for neuropathy but states she has been taking 600 mg 3 times daily recently due to excess pain  Hypertension: Current medications: Amlodipine 5 mg daily, lisinopril 40 mg daily, furosemide 40 mg daily, hydralazine 50 mg 3 times daily -Per chart notes,  amlodipine was stopped toward the end of last year based on swelling in the feet and legs; however, patient has continued to take amlodipine 5 mg daily -Doxazosin had been prescribed to replace amlodipine, but it appears this medication and hydralazine were held at one point due to hypotension -Patient has continued/resumed hydralazine 50 mg 3 times daily-she is not taking doxazosin -Patient states she does have a home blood pressure monitor, but she believes this may need new batteries and is unsure of accuracy.  The only home reading provided to me is 165/75.  Hyperlipidemia/ASCVD Risk Reduction Current lipid lowering medications: Atorvastatin 80 mg daily Antiplatelet regimen: None-aspirin causes hives  COPD: Current medications: Breo Ellipta 1 puff daily, Incruse elliptica 1 puff daily, albuterol nebulizer solution as needed -Medications tried in the past: Patient was previously taking Trelegy 1 puff daily prior to hospital admission, but it does not appear that this prescription has been refilled since April 2024.  Upon discharge from hospital in January, patient was able to take remainder of Breo and Incruse given inpatient home with her and has continued to use these. -Reports no exacerbations in the past year  Objective: Lab Results  Component Value Date   HGBA1C 5.8 (H) 05/02/2023   Lab Results  Component Value Date   CREATININE 0.88 05/03/2023   BUN 13 05/03/2023   NA 136 05/03/2023   K 3.6 05/03/2023   CL 104 05/03/2023   CO2 25 05/03/2023   Lab Results  Component Value Date   CHOL 110 08/03/2022   HDL 39 (L) 08/03/2022   LDLCALC 55 08/03/2022   TRIG 77 08/03/2022   CHOLHDL 2.8 08/03/2022   Medications Reviewed Today  Reviewed by Lenna Gilford, RPH (Pharmacist) on 06/08/23 at 1302  Med List Status: <None>   Medication Order Taking? Sig Documenting Provider Last Dose Status Informant  ACCU-CHEK GUIDE test strip [id number] Yes USE TO CHECK BLOOD SUGAR 3 TIMES  DAILY AS DIRECTED [name], NP Taking Active Self, Other  Accu-Chek Softclix Lancets lancets [id number] Yes CHECK BLOOD SUGAR TWICE DAILY [name], NP Taking Active Self, Other  albuterol (PROVENTIL) (2.5 MG/3ML) 0.083% nebulizer solution [id number] Yes USE 1 VIAL VIA NEBULIZER EVERY 4 HOURS AS NEEDED [name], NP Taking Active   amLODipine (NORVASC) 5 MG tablet [id number] Yes Take 5 mg by mouth daily. [provider] Taking Active   atorvastatin (LIPITOR) 80 MG tablet [id number] Yes Take 1 tablet (80 mg total) by mouth daily. Mecum, Erin E, PA-C Taking Active Self, Other  Blood Glucose Monitoring Suppl (ACCU-CHEK GUIDE) w/Device KIT [id number] Yes Use to check blood sugar 3 times a day. Aura Dials T, NP Taking Active Self, Other  Blood Pressure Monitoring (OMRON 3 SERIES BP MONITOR) DEVI [id number]  Use to check blood pressure as directed [name], NP  Active Self, Other  citalopram (CELEXA) 40 MG tablet [id number] Yes Take 1 tablet (40 mg total) by mouth daily. Mecum, Oswaldo Conroy, PA-C Taking Active Self, Other  diphenhydrAMINE (BENADRYL) 25 mg capsule [id number] Yes Take 25 mg by mouth every 6 (six) hours as needed for itching. Taking once a day [provider] Taking Active Self, Other  docusate sodium (COLACE) 100 MG capsule [id number] No TAKE 1 CAPSULE BY MOUTH TWICE DAILY  Patient not taking: Reported on 06/08/2023   [name], NP Not Taking Active            Med Note Littie Deeds, Bristyl Mclees A   Tue Jun 08, 2023 12:51 PM) Needs refill  Dulaglutide (TRULICITY) 0.75 MG/0.5ML SOPN [id number] Yes 0.75 mg by ultrasound guided injection route once a week. Mecum, Oswaldo Conroy, PA-C Taking Active Self, Other  EPINEPHrine 0.3 mg/0.3 mL IJ SOAJ injection [id number] No INJECT 1 SYRINGE INTO OUTER THIGH ONCE AS NEEDED FOR SEVERE ALLERGIC REACTION.  Patient not taking: Reported on 06/08/2023   Mecum, Oswaldo Conroy, PA-C Not Taking Active Self, Other           Med Note  Littie Deeds, Orvella Digiulio A   Tue Jun 08, 2023 12:51 PM) Needs refill  fluticasone furoate-vilanterol (BREO ELLIPTA) 100-25 MCG/ACT AEPB [id number] Yes Inhale 1 puff into the lungs daily. [provider] Taking Active            Med Note Littie Deeds, Rhyleigh Grassel A   Tue Jun 08, 2023  1:01 PM) Given to take home after recent hospital visit  furosemide (LASIX) 40 MG tablet [id number] Yes Take 40 mg by mouth daily. [provider] Taking Active   gabapentin (NEURONTIN) 300 MG capsule [id number] Yes TAKE 1 CAPSULE BY MOUTH 3 TIMES DAILY ASNEEDED (NEED OFFICE VISIT FOR FURTHER REFILLS)  Patient taking differently: Take 600 mg by mouth 3 (three) times daily.   [name], NP Taking Active   hydrALAZINE (APRESOLINE) 50 MG tablet [id number] Yes Take 50 mg by mouth 3 (three) times daily. [provider] Taking Active   hydrOXYzine (VISTARIL) 25 MG capsule [id number] Yes Take 1 capsule (25 mg total) by mouth every 8 (eight) hours as needed. Mecum, Erin E, PA-C Taking Active Self, Other  levothyroxine (SYNTHROID) 50 MCG tablet [id number] Yes TAKE 1 TABLET BY MOUTH ONCE DAILY ON AN EMPTY STOMACH. WAIT 30 MINUTES BEFORE TAKING  OTHER MEDS. Mecum, Erin E, PA-C Taking Active Self, Other  lisinopril (ZESTRIL) 40 MG tablet [id number] Yes Take 40 mg by mouth daily. [provider] Taking Active   meloxicam (MOBIC) 15 MG tablet [id number] Yes TAKE 1 TABLET BY MOUTH ONCE DAILY AS NEEDED WITH FOOD OR MILK [name], NP Taking Active Self, Other           Med Note Littie Deeds, Marcanthony Sleight A   Fri Aug 07, 2022  9:13 AM) Taking daily  montelukast (SINGULAIR) 10 MG tablet [id number] No TAKE 1 TABLET BY MOUTH AT BEDTIME  Patient not taking: Reported on 06/08/2023   [name], NP Not Taking Active Self, Other           Med Note Littie Deeds, Kin Galbraith A   Tue Jun 08, 2023 12:52 PM) Needs refill  nitroGLYCERIN (NITROSTAT) 0.4 MG SL tablet [id number] Yes DISSOLVE 1 TABLET UNDER TONGUE AT ONSET OF CHEST PAIN.  REPEAT IN 5 MIN IF NOT RESOLVED, MAX 3 DOSES. 911 IF NEEDED. [name], NP Taking Active Self, Other  omeprazole (PRILOSEC) 20 MG capsule [id number] No TAKE 1 CAPSULE BY MOUTH ONCE DAILY  Patient not taking: Reported on 06/08/2023   [name], NP Not Taking Active Self, Other           Med Note Littie Deeds, Tonnia Bardin A   Tue Jun 08, 2023 12:52 PM) Needs refill  ondansetron (ZOFRAN-ODT) 4 MG disintegrating tablet [id number] No Take 1 tablet (4 mg total) by mouth every 6 (six) hours as needed for nausea or vomiting.  Patient not taking: Reported on 06/08/2023   [name], NP Not Taking Active   OXYGEN [id number] Yes Inhale 3 L into the lungs daily. [provider] Taking Active Self, Other  Silver (AQUACEL AG FOAM) 8"X8" PADS [id number] No Apply to areas of ulceration on legs as directed Sunnie Nielsen, DO Unknown Active   umeclidinium bromide (INCRUSE ELLIPTA) 62.5 MCG/ACT AEPB [id number] Yes Inhale 1 puff into the lungs daily. [provider] Taking Active            Med Note Littie Deeds, Brailon Don A   Tue Jun 08, 2023  1:00 PM) Given to take home after recent hospital visit  Vitamin D, Ergocalciferol, (DRISDOL) 1.25 MG (50000 UNIT) CAPS capsule [id number] Yes TAKE 1 CAPSULE BY MOUTH EVERY 7 DAYS [name], NP Taking Active Self, Other  Wound Cleansers (VASHE WOUND) 0.033 % SOLN [id number] No Apply 1 Application topically daily. Use as directed to cleanse lower legs skin Sunnie Nielsen, DO Unknown Active            Assessment/Plan:    Medication Access/Adherence -Discussed [place] outpatient pharmacies with patient, as these locations will allow a charge account for patients with Medicaid and offer home delivery services -Will contact [facility] to clarify details of charge accounts and follow up with patient -Once determined where patient will get her next refills from, we will coordinate with pharmacy and providers to get all  prescriptions sent there  Diabetes: -Currently controlled -Continue current medication regimen, regular testing of home blood glucose, and consistent follow-up with PCP and PharmD  Hypertension: -Currently moderately controlled -Recommended to check home blood pressure and heart rate daily and record -Recommend to evaluate blood pressure control and fluid retention at upcoming PCP appointment 2/12.  Based on findings, recommend discontinuing amlodipine 5 mg daily-could resume low-dose doxazosin if blood pressure is or becomes elevated upon stopping amlodipine.  Hyperlipidemia/ASCVD Risk Reduction: -Currently controlled.  -Continue current  regimen regular follow-up with PCP -I recommend follow-up lipid panel and LFTs at patient's next PCP visit (after tomorrow's)  COPD: -Currently controlled.  -Patient will likely need to be changed back over to Trelegy based on Medicaid drug formulary.  I recommend regular maintenance use of this inhaler to prevent any COPD exacerbation.  Follow Up Plan: Will call patient tomorrow to further discuss pharmacy options and medication access/adherence and will continue to follow-up with patient on a regular basis  Lenna Gilford, PharmD, DPLA

## 2023-06-08 NOTE — Assessment & Plan Note (Signed)
Recommended eating smaller high protein, low fat meals more frequently and exercising 30 mins a day 5 times a week with a goal of 10-15lb weight loss in the next 3 months.

## 2023-06-08 NOTE — Assessment & Plan Note (Signed)
Chronic. Continue to have pain.  Discussed with patient the importance of taking Gabapentin as prescribed.  Will give short supply of methocarbamol.  Patient states this has helped her pain in the past.  Suspect some of her pain is due to weight and immobility vs neuropathy.

## 2023-06-08 NOTE — Assessment & Plan Note (Signed)
Chronic.  Controlled.  Recommend patient stop the Amlodipine due to swelling in her legs. Blood pressure has been running 110-120/80-90.   Return to clinic in 3 months for reevaluation.  Call sooner if concerns arise.

## 2023-06-08 NOTE — Progress Notes (Signed)
BP 129/71 (BP Location: Left Arm, Patient Position: Sitting, Cuff Size: Large)   Pulse 72   Ht 5\' 2"  (1.575 m)   Wt (!) 322 lb 3.2 oz (146.1 kg)   LMP 06/09/2017 (Approximate)   SpO2 99%   BMI 58.93 kg/m    Subjective:    Patient ID: Cheryl Chandler, female    DOB: 10/19/1965, 58 y.o.   MRN: [id number]  HPI: Cheryl Chandler is a 58 y.o. female  Chief Complaint  Patient presents with   Follow-up   Fall    Last fall was 2-3 weeks ago   Back Pain   Neck Pain   Medication Management    All medications    HYPERTENSION / HYPERLIPIDEMIA Satisfied with current treatment? yes Duration of hypertension: years BP monitoring frequency: daily BP range: 110-120/80 BP medication side effects: no Past BP meds:  lasix and lisinopril, hydralazine, spirnloactone  Duration of hyperlipidemia: years Cholesterol medication side effects: no Cholesterol supplements: none Past cholesterol medications: simvastatin (zocor) Medication compliance: excellent compliance Aspirin: no Recent stressors: no Recurrent headaches: no Visual changes: no Palpitations: no Dyspnea: yes Chest pain: no Lower extremity edema: yes Dizzy/lightheaded: no Following with the lymphedema specialist. No able to use her leg pumps due to wound on her leg. She is following up with vascular.  COPD She is followed by Dr. Jayme Cloud with Pulmonology.  She is supposed to have an appt with Pulm next month.  She is still on 3L of Oxygen at home. She is still working to get Portable oxygen.  COPD status: Uncontrolled Satisfied with current treatment?: yes Oxygen use: yes- 3L  Dyspnea frequency: yes Cough frequency: daily Rescue inhaler frequency: has cut back from 4 times daily Limitation of activity: yes Productive cough:  Last Spirometry:  Pneumovax: Up to Date Influenza: Up to Date  HYPOTHYROIDISM Thyroid control status:controlled Satisfied with current treatment? yes Medication side effects:  no Medication compliance: excellent compliance Etiology of hypothyroidism:  Recent dose adjustment:no Fatigue: yes Cold intolerance: no Heat intolerance: no Weight gain: no Weight loss: no Constipation: no Diarrhea/loose stools: no Palpitations: no Lower extremity edema: yes Anxiety/depressed mood: no  DEPRESSION/ANXIETY Patient states she has been more stressed lately.  She is dealing with her husband who has been stressing her out.    Flowsheet Row Video Visit from 05/11/2023 in [facility]  PHQ-9 Total Score 9         05/11/2023   11:12 AM 03/08/2023    8:21 AM 02/09/2023    2:53 PM 11/09/2022    1:17 PM  GAD 7 : Generalized Anxiety Score  Nervous, Anxious, on Edge 3 3 3 3   Control/stop worrying 3 3 3 3   Worry too much - different things 3 3 3 3   Trouble relaxing 1 2 3 3   Restless 0 2 3 0  Easily annoyed or irritable 1 2 3 2   Afraid - awful might happen 3 2 3  0  Total GAD 7 Score 14 17 21 14   Anxiety Difficulty  Somewhat difficult Somewhat difficult Extremely difficult    DIABETES Has been out of her medications on and off.  A1c is well controlled.   Hypoglycemic episodes:no Polydipsia/polyuria: no Visual disturbance: no Chest pain: no Paresthesias: no Glucose Monitoring: no  Accucheck frequency: Not Checking  Fasting glucose:  Post prandial:  Evening:  Before meals: Taking Insulin?: no  Long acting insulin:  Short acting insulin: Blood Pressure Monitoring: daily Retinal Examination: Up to Date Foot  Exam: Up to Date Diabetic Education: Not Completed Pneumovax: Up to Date Influenza: Up to Date Aspirin: no  BACK PAIN Having a lot of back pain.  States she is taking Gabapentin 600mg  TID.  She has had a prescription for methocarbamol which helps her more than the Gabapentin.     Relevant past medical, surgical, family and social history reviewed and updated as indicated. Interim medical history since our last visit  reviewed. Allergies and medications reviewed and updated.  Review of Systems  Constitutional:  Negative for fever and unexpected weight change.  Eyes:  Negative for visual disturbance.  Respiratory:  Positive for shortness of breath. Negative for cough and chest tightness.   Cardiovascular:  Positive for leg swelling. Negative for chest pain and palpitations.  Endocrine: Negative for cold intolerance.  Musculoskeletal:        Leg and back pain  Neurological:  Negative for dizziness and headaches.  Psychiatric/Behavioral:  Positive for dysphoric mood. Negative for suicidal ideas. The patient is nervous/anxious.     Per HPI unless specifically indicated above     Objective:    BP 129/71 (BP Location: Left Arm, Patient Position: Sitting, Cuff Size: Large)   Pulse 72   Ht 5\' 2"  (1.575 m)   Wt (!) 322 lb 3.2 oz (146.1 kg)   LMP 06/09/2017 (Approximate)   SpO2 99%   BMI 58.93 kg/m   Wt Readings from Last 3 Encounters:  06/08/23 (!) 322 lb 3.2 oz (146.1 kg)  05/01/23 (!) 315 lb 0.6 oz (142.9 kg)  02/01/23 (!) 315 lb (142.9 kg)    Physical Exam Vitals and nursing note reviewed.  Constitutional:      General: She is not in acute distress.    Appearance: Normal appearance. She is normal weight. She is not ill-appearing, toxic-appearing or diaphoretic.  HENT:     Head: Normocephalic.     Right Ear: External ear normal.     Left Ear: External ear normal.     Nose: Nose normal.     Mouth/Throat:     Mouth: Mucous membranes are moist.     Pharynx: Oropharynx is clear.  Eyes:     General:        Right eye: No discharge.        Left eye: No discharge.     Extraocular Movements: Extraocular movements intact.     Conjunctiva/sclera: Conjunctivae normal.     Pupils: Pupils are equal, round, and reactive to light.  Cardiovascular:     Rate and Rhythm: Normal rate and regular rhythm.     Heart sounds: No murmur heard. Pulmonary:     Effort: Pulmonary effort is normal. No  respiratory distress.     Breath sounds: Decreased breath sounds present. No wheezing or rales.  Musculoskeletal:     Cervical back: Normal range of motion and neck supple.     Right lower leg: 3+ Edema present.     Left lower leg: 3+ Edema present.     Comments: In her wheelchair- not able to get out of chair  Skin:    General: Skin is warm and dry.     Capillary Refill: Capillary refill takes less than 2 seconds.  Neurological:     General: No focal deficit present.     Mental Status: She is alert and oriented to person, place, and time. Mental status is at baseline.  Psychiatric:        Mood and Affect: Mood normal.  Behavior: Behavior normal.        Thought Content: Thought content normal.        Judgment: Judgment normal.     Results for orders placed or performed during the hospital encounter of 05/01/23  Lipase, blood   Collection Time: 05/01/23  5:10 AM  Result Value Ref Range   Lipase 23 11 - 51 U/L  Comprehensive metabolic panel   Collection Time: 05/01/23  5:10 AM  Result Value Ref Range   Sodium 140 135 - 145 mmol/L   Potassium 3.8 3.5 - 5.1 mmol/L   Chloride 104 98 - 111 mmol/L   CO2 19 (L) 22 - 32 mmol/L   Glucose, Bld 144 (H) 70 - 99 mg/dL   BUN 20 6 - 20 mg/dL   Creatinine, Ser 1.61 0.44 - 1.00 mg/dL   Calcium 9.3 8.9 - 09.6 mg/dL   Total Protein 8.4 (H) 6.5 - 8.1 g/dL   Albumin 3.6 3.5 - 5.0 g/dL   AST 26 15 - 41 U/L   ALT 27 0 - 44 U/L   Alkaline Phosphatase 101 38 - 126 U/L   Total Bilirubin 0.5 0.0 - 1.2 mg/dL   GFR, Estimated >04 >54 mL/min   Anion gap 17 (H) 5 - 15  CBC   Collection Time: 05/01/23  5:10 AM  Result Value Ref Range   WBC 16.5 (H) 4.0 - 10.5 K/uL   RBC 4.79 3.87 - 5.11 MIL/uL   Hemoglobin 13.5 12.0 - 15.0 g/dL   HCT 09.8 11.9 - 14.7 %   MCV 89.1 80.0 - 100.0 fL   MCH 28.2 26.0 - 34.0 pg   MCHC 31.6 30.0 - 36.0 g/dL   RDW 82.9 (H) 56.2 - 13.0 %   Platelets 413 (H) 150 - 400 K/uL   nRBC 0.0 0.0 - 0.2 %   Beta-hydroxybutyric acid   Collection Time: 05/01/23  5:10 AM  Result Value Ref Range   Beta-Hydroxybutyric Acid 0.31 (H) 0.05 - 0.27 mmol/L  Gastrointestinal Panel by PCR , Stool   Collection Time: 05/01/23  1:56 PM   Specimen: Stool  Result Value Ref Range   Campylobacter species NOT DETECTED NOT DETECTED   Plesimonas shigelloides NOT DETECTED NOT DETECTED   Salmonella species NOT DETECTED NOT DETECTED   Yersinia enterocolitica NOT DETECTED NOT DETECTED   Vibrio species DETECTED (A) NOT DETECTED   Vibrio cholerae DETECTED (A) NOT DETECTED   Enteroaggregative E coli (EAEC) NOT DETECTED NOT DETECTED   Enteropathogenic E coli (EPEC) NOT DETECTED NOT DETECTED   Enterotoxigenic E coli (ETEC) NOT DETECTED NOT DETECTED   Shiga like toxin producing E coli (STEC) NOT DETECTED NOT DETECTED   Shigella/Enteroinvasive E coli (EIEC) NOT DETECTED NOT DETECTED   Cryptosporidium NOT DETECTED NOT DETECTED   Cyclospora cayetanensis NOT DETECTED NOT DETECTED   Entamoeba histolytica NOT DETECTED NOT DETECTED   Giardia lamblia NOT DETECTED NOT DETECTED   Adenovirus F40/41 NOT DETECTED NOT DETECTED   Astrovirus NOT DETECTED NOT DETECTED   Norovirus GI/GII DETECTED (A) NOT DETECTED   Rotavirus A NOT DETECTED NOT DETECTED   Sapovirus (I, II, IV, and V) NOT DETECTED NOT DETECTED  CA 125   Collection Time: 05/01/23  1:56 PM  Result Value Ref Range   Cancer Antigen (CA) 125 6.3 0.0 - 38.1 U/mL  hCG, quantitative, pregnancy   Collection Time: 05/01/23  1:56 PM  Result Value Ref Range   hCG, Beta Chain, Quant, S <1 <5 mIU/mL  Miscellaneous test (send-out)  Collection Time: 05/01/23  1:56 PM  Result Value Ref Range   Miscellaneous Test VIBRIO    Miscellaneous Test Results See Scanned report in [facility]   Magnesium   Collection Time: 05/01/23  1:56 PM  Result Value Ref Range   Magnesium 2.4 1.7 - 2.4 mg/dL  CBG monitoring, ED   Collection Time: 05/01/23 10:27 PM  Result Value Ref Range    Glucose-Capillary 121 (H) 70 - 99 mg/dL  Basic metabolic panel   Collection Time: 05/02/23  4:46 AM  Result Value Ref Range   Sodium 137 135 - 145 mmol/L   Potassium 3.4 (L) 3.5 - 5.1 mmol/L   Chloride 104 98 - 111 mmol/L   CO2 20 (L) 22 - 32 mmol/L   Glucose, Bld 112 (H) 70 - 99 mg/dL   BUN 13 6 - 20 mg/dL   Creatinine, Ser 6.29 0.44 - 1.00 mg/dL   Calcium 8.0 (L) 8.9 - 10.3 mg/dL   GFR, Estimated >52 >84 mL/min   Anion gap 13 5 - 15  Magnesium   Collection Time: 05/02/23  4:46 AM  Result Value Ref Range   Magnesium 2.0 1.7 - 2.4 mg/dL  CBC with Differential/Platelet   Collection Time: 05/02/23  4:46 AM  Result Value Ref Range   WBC 7.0 4.0 - 10.5 K/uL   RBC 3.92 3.87 - 5.11 MIL/uL   Hemoglobin 10.9 (L) 12.0 - 15.0 g/dL   HCT 13.2 (L) 44.0 - 10.2 %   MCV 86.2 80.0 - 100.0 fL   MCH 27.8 26.0 - 34.0 pg   MCHC 32.2 30.0 - 36.0 g/dL   RDW 72.5 (H) 36.6 - 44.0 %   Platelets 328 150 - 400 K/uL   nRBC 0.0 0.0 - 0.2 %   Neutrophils Relative % 83 %   Neutro Abs 5.7 1.7 - 7.7 K/uL   Lymphocytes Relative 8 %   Lymphs Abs 0.6 (L) 0.7 - 4.0 K/uL   Monocytes Relative 8 %   Monocytes Absolute 0.6 0.1 - 1.0 K/uL   Eosinophils Relative 1 %   Eosinophils Absolute 0.0 0.0 - 0.5 K/uL   Basophils Relative 0 %   Basophils Absolute 0.0 0.0 - 0.1 K/uL   Immature Granulocytes 0 %   Abs Immature Granulocytes 0.03 0.00 - 0.07 K/uL  Hemoglobin A1c   Collection Time: 05/02/23  4:46 AM  Result Value Ref Range   Hgb A1c MFr Bld 5.8 (H) 4.8 - 5.6 %   Mean Plasma Glucose 120 mg/dL  CA 34-7 (SERIAL)   Collection Time: 05/02/23  4:46 AM  Result Value Ref Range   CA 19-9 5 0 - 35 U/mL  CBG monitoring, ED   Collection Time: 05/02/23  7:34 AM  Result Value Ref Range   Glucose-Capillary 104 (H) 70 - 99 mg/dL  CBG monitoring, ED   Collection Time: 05/02/23 11:57 AM  Result Value Ref Range   Glucose-Capillary 98 70 - 99 mg/dL  CBG monitoring, ED   Collection Time: 05/02/23  4:29 PM  Result  Value Ref Range   Glucose-Capillary 73 70 - 99 mg/dL  CBG monitoring, ED   Collection Time: 05/02/23 10:38 PM  Result Value Ref Range   Glucose-Capillary 87 70 - 99 mg/dL  CBC   Collection Time: 05/03/23  4:05 AM  Result Value Ref Range   WBC 5.5 4.0 - 10.5 K/uL   RBC 3.53 (L) 3.87 - 5.11 MIL/uL   Hemoglobin 10.1 (L) 12.0 - 15.0 g/dL  HCT 31.1 (L) 36.0 - 46.0 %   MCV 88.1 80.0 - 100.0 fL   MCH 28.6 26.0 - 34.0 pg   MCHC 32.5 30.0 - 36.0 g/dL   RDW 40.9 (H) 81.1 - 91.4 %   Platelets 286 150 - 400 K/uL   nRBC 0.0 0.0 - 0.2 %  Basic metabolic panel   Collection Time: 05/03/23  4:05 AM  Result Value Ref Range   Sodium 136 135 - 145 mmol/L   Potassium 3.6 3.5 - 5.1 mmol/L   Chloride 104 98 - 111 mmol/L   CO2 25 22 - 32 mmol/L   Glucose, Bld 98 70 - 99 mg/dL   BUN 13 6 - 20 mg/dL   Creatinine, Ser 7.82 0.44 - 1.00 mg/dL   Calcium 7.9 (L) 8.9 - 10.3 mg/dL   GFR, Estimated >95 >62 mL/min   Anion gap 7 5 - 15  CBG monitoring, ED   Collection Time: 05/03/23  8:59 AM  Result Value Ref Range   Glucose-Capillary 94 70 - 99 mg/dL  CBG monitoring, ED   Collection Time: 05/03/23 11:20 AM  Result Value Ref Range   Glucose-Capillary 87 70 - 99 mg/dL      Assessment & Plan:   Problem List Items Addressed This Visit       Cardiovascular and Mediastinum   Essential hypertension   Chronic.  Controlled.  Recommend patient stop the Amlodipine due to swelling in her legs. Blood pressure has been running 110-120/80-90.   Return to clinic in 3 months for reevaluation.  Call sooner if concerns arise.          Respiratory   COPD (chronic obstructive pulmonary disease) (HCC) - Primary   Patient is on Oxygen at home- 3L.  Followed by Pulmonology.  Has not been able to get portable oxygen.  Has follow up with Pulmonology next month.        Endocrine   Controlled type 2 diabetes mellitus with diabetic polyneuropathy, without long-term current use of insulin (HCC)   Chronic.  Controlled.   Continue with current medication regimen of Trulicity.  Continues to have financial troubles.  Working with pharmacist to get medications changed to [facility] to help with ability to get medications. Labs ordered today.  Return to clinic in 3 months for reevaluation.  Call sooner if concerns arise.       Relevant Medications   methocarbamol (ROBAXIN) 500 MG tablet     Nervous and Auditory   Peripheral neuropathy   Chronic. Continue to have pain.  Discussed with patient the importance of taking Gabapentin as prescribed.  Will give short supply of methocarbamol.  Patient states this has helped her pain in the past.  Suspect some of her pain is due to weight and immobility vs neuropathy.       Relevant Medications   methocarbamol (ROBAXIN) 500 MG tablet     Other   Morbid obesity (HCC)   Recommended eating smaller high protein, low fat meals more frequently and exercising 30 mins a day 5 times a week with a goal of 10-15lb weight loss in the next 3 months.           Follow up plan: Return in about 3 months (around 09/05/2023) for HTN, HLD, DM2 FU.

## 2023-06-09 ENCOUNTER — Other Ambulatory Visit: Payer: Self-pay

## 2023-06-09 DIAGNOSIS — I890 Lymphedema, not elsewhere classified: Secondary | ICD-10-CM | POA: Diagnosis not present

## 2023-06-09 NOTE — Progress Notes (Signed)
   06/09/2023  Patient ID: Cheryl Chandler, female   DOB: 1965-07-06, 58 y.o.   MRN: [id number]  Patient out reach to follow-up on medication access and affordability.  Patient states transferring prescriptions to [facility] outpatient pharmacy where she can be set up for home delivery and a charge account would be beneficial.  She is currently in need of refills on all medications, but she does not have the means to pay for co-pays at this time.  States she can make monthly payments on the third of each month to the pharmacy to cover charges to her account for medications.  She understands that the balance cannot exceed $100, and payments will need to be made with each statement sent.  I have also verified the patient's home address and updated her preferred pharmacy on her profile to [facility] outpatient pharmacy.  Collaborating with Pharm.D. to transfer over refills from [facility] on her active medications.  If medications do not have will, I will collaborate with prescribers to get new order sent.  Telephone follow-up visit Monday to check on processing/delivery status of the following medications:  Accu-Chek guide test strips Accu-Chek Softclix lancets Albuterol nebulizer solution Atorvastatin 80 mg daily Citalopram 40 mg daily Trulicity 0.75 mg weekly Trelegy inhaler Furosemide 40 mg daily Gabapentin 300 mg 3 times daily Hydralazine 50 mg tablet 3 times daily Hydroxyzine 25 mg capsule every 8 hours as needed Levothyroxine 50 mcg daily Lisinopril 40 mg daily Montelukast 10 mg at bedtime Nitroglycerin sublingual tablet EpiPen Omeprazole 20 mg daily Ondansetron ODT 4 mg tablet Vitamin D 50,000 unit capsule weekly Meloxicam 15 mg tablet daily as needed Methocarbamol 500 mg twice daily  Lenna Gilford, PharmD, DPLA

## 2023-06-14 ENCOUNTER — Other Ambulatory Visit: Payer: Self-pay

## 2023-06-14 DIAGNOSIS — K219 Gastro-esophageal reflux disease without esophagitis: Secondary | ICD-10-CM | POA: Diagnosis not present

## 2023-06-14 DIAGNOSIS — J9611 Chronic respiratory failure with hypoxia: Secondary | ICD-10-CM | POA: Diagnosis not present

## 2023-06-14 DIAGNOSIS — I890 Lymphedema, not elsewhere classified: Secondary | ICD-10-CM | POA: Diagnosis not present

## 2023-06-14 DIAGNOSIS — J4489 Other specified chronic obstructive pulmonary disease: Secondary | ICD-10-CM | POA: Diagnosis not present

## 2023-06-14 DIAGNOSIS — E039 Hypothyroidism, unspecified: Secondary | ICD-10-CM | POA: Diagnosis not present

## 2023-06-14 DIAGNOSIS — E785 Hyperlipidemia, unspecified: Secondary | ICD-10-CM | POA: Diagnosis not present

## 2023-06-14 DIAGNOSIS — I10 Essential (primary) hypertension: Secondary | ICD-10-CM | POA: Diagnosis not present

## 2023-06-14 DIAGNOSIS — Z6841 Body Mass Index (BMI) 40.0 and over, adult: Secondary | ICD-10-CM | POA: Diagnosis not present

## 2023-06-14 DIAGNOSIS — F32A Depression, unspecified: Secondary | ICD-10-CM | POA: Diagnosis not present

## 2023-06-14 DIAGNOSIS — Z9981 Dependence on supplemental oxygen: Secondary | ICD-10-CM | POA: Diagnosis not present

## 2023-06-14 DIAGNOSIS — F419 Anxiety disorder, unspecified: Secondary | ICD-10-CM | POA: Diagnosis not present

## 2023-06-14 DIAGNOSIS — E1140 Type 2 diabetes mellitus with diabetic neuropathy, unspecified: Secondary | ICD-10-CM | POA: Diagnosis not present

## 2023-06-14 NOTE — Progress Notes (Unsigned)
   06/14/2023  Patient ID: Cheryl Chandler, female   DOB: 12/30/1965, 57 y.o.   MRN: [id number]  Out reach attempt for follow-up visit to discuss transferring a medication to [facility] outpatient pharmacy for home delivery was unsuccessful.  I was able to leave a HIPAA compliant voicemail with my direct phone number for patient to return my call at her convenience.  As of now, the pharmacy is still waiting for [facility] to send transfers over; so medications can be filled and delivered to the patient.  Lenna Gilford, PharmD, DPLA

## 2023-06-15 ENCOUNTER — Telehealth: Payer: Self-pay | Admitting: Pulmonary Disease

## 2023-06-15 ENCOUNTER — Other Ambulatory Visit: Payer: Self-pay

## 2023-06-15 ENCOUNTER — Other Ambulatory Visit (HOSPITAL_COMMUNITY): Payer: Self-pay

## 2023-06-15 DIAGNOSIS — J301 Allergic rhinitis due to pollen: Secondary | ICD-10-CM

## 2023-06-15 MED ORDER — LISINOPRIL 40 MG PO TABS
40.0000 mg | ORAL_TABLET | Freq: Every day | ORAL | 3 refills | Status: DC
Start: 2023-06-15 — End: 2024-01-11

## 2023-06-15 MED ORDER — ACCU-CHEK SOFTCLIX LANCETS MISC: 3 refills | Status: AC

## 2023-06-15 MED ORDER — NITROGLYCERIN 0.4 MG SL SUBL: SUBLINGUAL_TABLET | SUBLINGUAL | 1 refills | Status: DC

## 2023-06-15 MED ORDER — FUROSEMIDE 40 MG PO TABS: 40.0000 mg | ORAL_TABLET | Freq: Every day | ORAL | 3 refills | Status: DC

## 2023-06-15 MED ORDER — OMEPRAZOLE 20 MG PO CPDR: 20.0000 mg | DELAYED_RELEASE_CAPSULE | Freq: Every day | ORAL | 3 refills | Status: DC

## 2023-06-15 MED ORDER — TRULICITY 0.75 MG/0.5ML ~~LOC~~ SOAJ
0.7500 mg | SUBCUTANEOUS | 3 refills | Status: DC
Start: 2023-06-15 — End: 2024-01-11

## 2023-06-15 MED ORDER — TRELEGY ELLIPTA 100-62.5-25 MCG/ACT IN AEPB: 1.0000 | INHALATION_SPRAY | Freq: Every day | RESPIRATORY_TRACT | 0 refills | Status: DC

## 2023-06-15 MED ORDER — VITAMIN D (ERGOCALCIFEROL) 1.25 MG (50000 UNIT) PO CAPS: ORAL_CAPSULE | ORAL | 3 refills | Status: AC

## 2023-06-15 MED ORDER — MONTELUKAST SODIUM 10 MG PO TABS
10.0000 mg | ORAL_TABLET | Freq: Every day | ORAL | 3 refills | Status: DC
Start: 2023-06-15 — End: 2024-01-11

## 2023-06-15 MED ORDER — ACCU-CHEK GUIDE TEST VI STRP: ORAL_STRIP | 3 refills | Status: AC

## 2023-06-15 NOTE — Telephone Encounter (Signed)
Copied from CRM [id number]. Topic: General - Other >> Jun 14, 2023  3:39 PM Santiya F wrote: Reason for CRM: Pt is calling in returning a call from [place].

## 2023-06-15 NOTE — Progress Notes (Signed)
   06/15/2023  Patient ID: Cheryl Chandler, female   DOB: December 02, 1965, 58 y.o.   MRN: [id number]  Patient out reach to follow up on medication access and affordability.  Patient states her husband went to pick up testing supplies from [facility] this week, and he was provided with refills to bring home for her gabapentin, ondansetron, hydroxyzine, citalopram, levothyroxine, atorvastatin, and hydralazine.  Pharmacy allowed co-pays to be placed on charge account.  Patient is still in need of refills for Trulicity, furosemide, lisinopril, montelukast, nitroglycerin, omeprazole, vitamin D, and Trelegy.  I contacted [facility] and each of these medications are out of refills; so I will work with providers to get prescriptions over to the pharmacy; so they can bill these.  Patient prefers to stay with [facility] for medication fills at this time since they are continuing to work with her around affordability.  I will check in with the patient again in 4 weeks to verify she is able to continue to get maintenance medications refilled on a regular basis.  Lenna Gilford, PharmD, DPLA

## 2023-06-15 NOTE — Telephone Encounter (Signed)
Trelegy needs to be sent as a quality of 60, not 1.

## 2023-06-15 NOTE — Telephone Encounter (Signed)
Prescription updated. Nothing further needed.

## 2023-06-16 ENCOUNTER — Other Ambulatory Visit: Payer: Self-pay

## 2023-06-18 ENCOUNTER — Ambulatory Visit: Payer: Self-pay | Admitting: Nurse Practitioner

## 2023-06-18 NOTE — Telephone Encounter (Signed)
  Chief Complaint: Foot Pain Symptoms: Pain to bottom of foot, area tender and red-no broken skin Frequency: a week Pertinent Negatives: Patient denies fever Disposition: [] ED /[] Urgent Care (no appt availability in office) / [x] Appointment(In office/virtual)/ []  [facility]/ [] Home Care/ [] Refused Recommended Disposition /[] [facility]/ []  Follow-up with PCP Additional Notes: patient with past hx of diabetic ulcer. HH PT at the house with patient calling in symptoms of pain to bottom of right foot with redness and area being tender. No broken skin or evidence of a wound. No fever-VS stable per PT. Per protocol, appointment being the recommendation. Appointment made for 06/21/2023 at 9:40 am. Patient verbalized understanding of plan and all question answered. Strict Callback information given if symptoms get worse.    Copied from CRM [id number]. Topic: Clinical - Red Word Triage >> Jun 18, 2023 12:24 PM Shon Hale wrote: Red Word that prompted transfer to Nurse Triage: Patient gettting pressure place on bottom of her foot. Looks like start of diabetic ulcer, red spot on bottom of right heel, really tender. Pain at an 8. Reason for Disposition  [1] MODERATE pain (e.g., interferes with normal activities, limping) AND [2] present > 3 days  Answer Assessment - Initial Assessment Questions 1. ONSET: "When did the pain start?"      Pain started about a week ago-pain started to increase 3-4 days ago 2. LOCATION: "Where is the pain located?"      Right foot-bottom of foot 3. PAIN: "How bad is the pain?"    (Scale 1-10; or mild, moderate, severe)  - MILD (1-3): doesn't interfere with normal activities.   - MODERATE (4-7): interferes with normal activities (e.g., work or school) or awakens from sleep, limping.   - SEVERE (8-10): excruciating pain, unable to do any normal activities, unable to walk.     Moderate to Severe 4. WORK OR EXERCISE: "Has there been any recent work or exercise  that involved this part of the body?"      No 5. CAUSE: "What do you think is causing the foot pain?"     Possibly the beginning of a diabetic ulcer 6. OTHER SYMPTOMS: "Do you have any other symptoms?" (e.g., leg pain, rash, fever, numbness)     Tenderness to area  Protocols used: Foot Pain-A-AH

## 2023-06-18 NOTE — Telephone Encounter (Signed)
FYI to provider for [facility] appointment.

## 2023-06-21 ENCOUNTER — Ambulatory Visit (INDEPENDENT_AMBULATORY_CARE_PROVIDER_SITE_OTHER): Payer: Medicaid Other | Admitting: Nurse Practitioner

## 2023-06-21 ENCOUNTER — Encounter: Payer: Self-pay | Admitting: Nurse Practitioner

## 2023-06-21 VITALS — BP 135/76 | HR 70 | Temp 98.4°F | Ht 64.0 in | Wt 304.0 lb

## 2023-06-21 DIAGNOSIS — E11621 Type 2 diabetes mellitus with foot ulcer: Secondary | ICD-10-CM | POA: Diagnosis not present

## 2023-06-21 DIAGNOSIS — L97509 Non-pressure chronic ulcer of other part of unspecified foot with unspecified severity: Secondary | ICD-10-CM | POA: Diagnosis not present

## 2023-06-21 MED ORDER — DOXYCYCLINE HYCLATE 100 MG PO TABS: 100.0000 mg | ORAL_TABLET | Freq: Two times a day (BID) | ORAL | 0 refills | Status: DC

## 2023-06-21 NOTE — Progress Notes (Signed)
 BP 135/76 (BP Location: Right Arm, Patient Position: Sitting, Cuff Size: Large)   Pulse 70   Temp 98.4 F (36.9 C)   Ht 5\' 4"  (1.626 m)   Wt (!) 304 lb (137.9 kg) Comment: per pt  LMP 06/09/2017 (Approximate)   SpO2 98%   BMI 52.18 kg/m    Subjective:    Patient ID: Cheryl Chandler, female    DOB: 14-Jul-1965, 58 y.o.   MRN: [id number]  HPI: Cheryl Chandler is a 58 y.o. female  Chief Complaint  Patient presents with   Foot Swelling    Right heel pain--swollen, redness, thick skin stabbing pain especially.--3-4 days Tried diabetic foot cream, tape, antibiotic cream.   FOOT PAIN Duration:  heel pain Involved foot: right Mechanism of injury:  none Location:  Onset:  Friday   Severity: 7/10  Quality:  sharp and throbbing Frequency: constant Radiation: no Aggravating factors: weight bearing  Alleviating factors:  antibiotic cream helped some   Status: worse Treatments attempted:  antibiotic cream   Relief with NSAIDs?:  No NSAIDs Taken Weakness with weight bearing or walking: no Morning stiffness: no Swelling: yes Redness: yes Bruising: no Paresthesias / decreased sensation: no  Fevers:no  Relevant past medical, surgical, family and social history reviewed and updated as indicated. Interim medical history since our last visit reviewed. Allergies and medications reviewed and updated.  Review of Systems  Skin:        Redness, swelling, pain, and warmth of right heel    Per HPI unless specifically indicated above     Objective:    BP 135/76 (BP Location: Right Arm, Patient Position: Sitting, Cuff Size: Large)   Pulse 70   Temp 98.4 F (36.9 C)   Ht 5\' 4"  (1.626 m)   Wt (!) 304 lb (137.9 kg) Comment: per pt  LMP 06/09/2017 (Approximate)   SpO2 98%   BMI 52.18 kg/m   Wt Readings from Last 3 Encounters:  06/21/23 (!) 304 lb (137.9 kg)  06/08/23 (!) 322 lb 3.2 oz (146.1 kg)  05/01/23 (!) 315 lb 0.6 oz (142.9 kg)    Physical Exam Vitals and  nursing note reviewed.  Constitutional:      General: She is not in acute distress.    Appearance: Normal appearance. She is obese. She is not ill-appearing, toxic-appearing or diaphoretic.  HENT:     Head: Normocephalic.     Right Ear: External ear normal.     Left Ear: External ear normal.     Nose: Nose normal.     Mouth/Throat:     Mouth: Mucous membranes are moist.     Pharynx: Oropharynx is clear.  Eyes:     General:        Right eye: No discharge.        Left eye: No discharge.     Extraocular Movements: Extraocular movements intact.     Conjunctiva/sclera: Conjunctivae normal.     Pupils: Pupils are equal, round, and reactive to light.  Cardiovascular:     Rate and Rhythm: Normal rate and regular rhythm.     Heart sounds: No murmur heard. Pulmonary:     Effort: Pulmonary effort is normal. No respiratory distress.     Breath sounds: Normal breath sounds. No wheezing or rales.  Musculoskeletal:     Cervical back: Normal range of motion and neck supple.       Feet:  Skin:    General: Skin is warm and dry.  Capillary Refill: Capillary refill takes less than 2 seconds.  Neurological:     General: No focal deficit present.     Mental Status: She is alert and oriented to person, place, and time. Mental status is at baseline.  Psychiatric:        Mood and Affect: Mood normal.        Behavior: Behavior normal.        Thought Content: Thought content normal.        Judgment: Judgment normal.     Results for orders placed or performed during the hospital encounter of 05/01/23  Lipase, blood   Collection Time: 05/01/23  5:10 AM  Result Value Ref Range   Lipase 23 11 - 51 U/L  Comprehensive metabolic panel   Collection Time: 05/01/23  5:10 AM  Result Value Ref Range   Sodium 140 135 - 145 mmol/L   Potassium 3.8 3.5 - 5.1 mmol/L   Chloride 104 98 - 111 mmol/L   CO2 19 (L) 22 - 32 mmol/L   Glucose, Bld 144 (H) 70 - 99 mg/dL   BUN 20 6 - 20 mg/dL   Creatinine, Ser  2.44 0.44 - 1.00 mg/dL   Calcium 9.3 8.9 - 01.0 mg/dL   Total Protein 8.4 (H) 6.5 - 8.1 g/dL   Albumin 3.6 3.5 - 5.0 g/dL   AST 26 15 - 41 U/L   ALT 27 0 - 44 U/L   Alkaline Phosphatase 101 38 - 126 U/L   Total Bilirubin 0.5 0.0 - 1.2 mg/dL   GFR, Estimated >27 >25 mL/min   Anion gap 17 (H) 5 - 15  CBC   Collection Time: 05/01/23  5:10 AM  Result Value Ref Range   WBC 16.5 (H) 4.0 - 10.5 K/uL   RBC 4.79 3.87 - 5.11 MIL/uL   Hemoglobin 13.5 12.0 - 15.0 g/dL   HCT 36.6 44.0 - 34.7 %   MCV 89.1 80.0 - 100.0 fL   MCH 28.2 26.0 - 34.0 pg   MCHC 31.6 30.0 - 36.0 g/dL   RDW 42.5 (H) 95.6 - 38.7 %   Platelets 413 (H) 150 - 400 K/uL   nRBC 0.0 0.0 - 0.2 %  Beta-hydroxybutyric acid   Collection Time: 05/01/23  5:10 AM  Result Value Ref Range   Beta-Hydroxybutyric Acid 0.31 (H) 0.05 - 0.27 mmol/L  Gastrointestinal Panel by PCR , Stool   Collection Time: 05/01/23  1:56 PM   Specimen: Stool  Result Value Ref Range   Campylobacter species NOT DETECTED NOT DETECTED   Plesimonas shigelloides NOT DETECTED NOT DETECTED   Salmonella species NOT DETECTED NOT DETECTED   Yersinia enterocolitica NOT DETECTED NOT DETECTED   Vibrio species DETECTED (A) NOT DETECTED   Vibrio cholerae DETECTED (A) NOT DETECTED   Enteroaggregative E coli (EAEC) NOT DETECTED NOT DETECTED   Enteropathogenic E coli (EPEC) NOT DETECTED NOT DETECTED   Enterotoxigenic E coli (ETEC) NOT DETECTED NOT DETECTED   Shiga like toxin producing E coli (STEC) NOT DETECTED NOT DETECTED   Shigella/Enteroinvasive E coli (EIEC) NOT DETECTED NOT DETECTED   Cryptosporidium NOT DETECTED NOT DETECTED   Cyclospora cayetanensis NOT DETECTED NOT DETECTED   Entamoeba histolytica NOT DETECTED NOT DETECTED   Giardia lamblia NOT DETECTED NOT DETECTED   Adenovirus F40/41 NOT DETECTED NOT DETECTED   Astrovirus NOT DETECTED NOT DETECTED   Norovirus GI/GII DETECTED (A) NOT DETECTED   Rotavirus A NOT DETECTED NOT DETECTED   Sapovirus (I, II, IV,  and  V) NOT DETECTED NOT DETECTED  CA 125   Collection Time: 05/01/23  1:56 PM  Result Value Ref Range   Cancer Antigen (CA) 125 6.3 0.0 - 38.1 U/mL  hCG, quantitative, pregnancy   Collection Time: 05/01/23  1:56 PM  Result Value Ref Range   hCG, Beta Chain, Quant, S <1 <5 mIU/mL  Miscellaneous test (send-out)   Collection Time: 05/01/23  1:56 PM  Result Value Ref Range   Miscellaneous Test VIBRIO    Miscellaneous Test Results See Scanned report in [place] Link   Magnesium   Collection Time: 05/01/23  1:56 PM  Result Value Ref Range   Magnesium 2.4 1.7 - 2.4 mg/dL  CBG monitoring, ED   Collection Time: 05/01/23 10:27 PM  Result Value Ref Range   Glucose-Capillary 121 (H) 70 - 99 mg/dL  Basic metabolic panel   Collection Time: 05/02/23  4:46 AM  Result Value Ref Range   Sodium 137 135 - 145 mmol/L   Potassium 3.4 (L) 3.5 - 5.1 mmol/L   Chloride 104 98 - 111 mmol/L   CO2 20 (L) 22 - 32 mmol/L   Glucose, Bld 112 (H) 70 - 99 mg/dL   BUN 13 6 - 20 mg/dL   Creatinine, Ser 9.60 0.44 - 1.00 mg/dL   Calcium 8.0 (L) 8.9 - 10.3 mg/dL   GFR, Estimated >45 >40 mL/min   Anion gap 13 5 - 15  Magnesium   Collection Time: 05/02/23  4:46 AM  Result Value Ref Range   Magnesium 2.0 1.7 - 2.4 mg/dL  CBC with Differential/Platelet   Collection Time: 05/02/23  4:46 AM  Result Value Ref Range   WBC 7.0 4.0 - 10.5 K/uL   RBC 3.92 3.87 - 5.11 MIL/uL   Hemoglobin 10.9 (L) 12.0 - 15.0 g/dL   HCT 98.1 (L) 19.1 - 47.8 %   MCV 86.2 80.0 - 100.0 fL   MCH 27.8 26.0 - 34.0 pg   MCHC 32.2 30.0 - 36.0 g/dL   RDW 29.5 (H) 62.1 - 30.8 %   Platelets 328 150 - 400 K/uL   nRBC 0.0 0.0 - 0.2 %   Neutrophils Relative % 83 %   Neutro Abs 5.7 1.7 - 7.7 K/uL   Lymphocytes Relative 8 %   Lymphs Abs 0.6 (L) 0.7 - 4.0 K/uL   Monocytes Relative 8 %   Monocytes Absolute 0.6 0.1 - 1.0 K/uL   Eosinophils Relative 1 %   Eosinophils Absolute 0.0 0.0 - 0.5 K/uL   Basophils Relative 0 %   Basophils Absolute  0.0 0.0 - 0.1 K/uL   Immature Granulocytes 0 %   Abs Immature Granulocytes 0.03 0.00 - 0.07 K/uL  Hemoglobin A1c   Collection Time: 05/02/23  4:46 AM  Result Value Ref Range   Hgb A1c MFr Bld 5.8 (H) 4.8 - 5.6 %   Mean Plasma Glucose 120 mg/dL  CA 65-7 (SERIAL)   Collection Time: 05/02/23  4:46 AM  Result Value Ref Range   CA 19-9 5 0 - 35 U/mL  CBG monitoring, ED   Collection Time: 05/02/23  7:34 AM  Result Value Ref Range   Glucose-Capillary 104 (H) 70 - 99 mg/dL  CBG monitoring, ED   Collection Time: 05/02/23 11:57 AM  Result Value Ref Range   Glucose-Capillary 98 70 - 99 mg/dL  CBG monitoring, ED   Collection Time: 05/02/23  4:29 PM  Result Value Ref Range   Glucose-Capillary 73 70 - 99 mg/dL  CBG  monitoring, ED   Collection Time: 05/02/23 10:38 PM  Result Value Ref Range   Glucose-Capillary 87 70 - 99 mg/dL  CBC   Collection Time: 05/03/23  4:05 AM  Result Value Ref Range   WBC 5.5 4.0 - 10.5 K/uL   RBC 3.53 (L) 3.87 - 5.11 MIL/uL   Hemoglobin 10.1 (L) 12.0 - 15.0 g/dL   HCT 40.9 (L) 81.1 - 91.4 %   MCV 88.1 80.0 - 100.0 fL   MCH 28.6 26.0 - 34.0 pg   MCHC 32.5 30.0 - 36.0 g/dL   RDW 78.2 (H) 95.6 - 21.3 %   Platelets 286 150 - 400 K/uL   nRBC 0.0 0.0 - 0.2 %  Basic metabolic panel   Collection Time: 05/03/23  4:05 AM  Result Value Ref Range   Sodium 136 135 - 145 mmol/L   Potassium 3.6 3.5 - 5.1 mmol/L   Chloride 104 98 - 111 mmol/L   CO2 25 22 - 32 mmol/L   Glucose, Bld 98 70 - 99 mg/dL   BUN 13 6 - 20 mg/dL   Creatinine, Ser 0.86 0.44 - 1.00 mg/dL   Calcium 7.9 (L) 8.9 - 10.3 mg/dL   GFR, Estimated >57 >84 mL/min   Anion gap 7 5 - 15  CBG monitoring, ED   Collection Time: 05/03/23  8:59 AM  Result Value Ref Range   Glucose-Capillary 94 70 - 99 mg/dL  CBG monitoring, ED   Collection Time: 05/03/23 11:20 AM  Result Value Ref Range   Glucose-Capillary 87 70 - 99 mg/dL      Assessment & Plan:   Problem List Items Addressed This Visit        Endocrine   T2DM (type 2 diabetes mellitus) (HCC) - Primary   Will treat with doxycyline due to allergies.  Referral placed for Podiatry.  Patient's nails are overgrown with fungal disease, large callous on heels.  Complete course of doxycyline. If unable to get into podiatry this week, will follow up in 4 days to ensure symptoms are not worsening.       Relevant Orders   Ambulatory referral to Podiatry     Follow up plan: Return in about 4 days (around 06/25/2023) for Friday.

## 2023-06-21 NOTE — Assessment & Plan Note (Signed)
 Will treat with doxycyline due to allergies.  Referral placed for Podiatry.  Patient's nails are overgrown with fungal disease, large callous on heels.  Complete course of doxycyline. If unable to get into podiatry this week, will follow up in 4 days to ensure symptoms are not worsening.

## 2023-06-22 DIAGNOSIS — I890 Lymphedema, not elsewhere classified: Secondary | ICD-10-CM | POA: Diagnosis not present

## 2023-06-22 DIAGNOSIS — E785 Hyperlipidemia, unspecified: Secondary | ICD-10-CM | POA: Diagnosis not present

## 2023-06-22 DIAGNOSIS — J4489 Other specified chronic obstructive pulmonary disease: Secondary | ICD-10-CM | POA: Diagnosis not present

## 2023-06-22 DIAGNOSIS — N3941 Urge incontinence: Secondary | ICD-10-CM | POA: Diagnosis not present

## 2023-06-22 DIAGNOSIS — E039 Hypothyroidism, unspecified: Secondary | ICD-10-CM | POA: Diagnosis not present

## 2023-06-22 DIAGNOSIS — K219 Gastro-esophageal reflux disease without esophagitis: Secondary | ICD-10-CM | POA: Diagnosis not present

## 2023-06-22 DIAGNOSIS — Z9981 Dependence on supplemental oxygen: Secondary | ICD-10-CM | POA: Diagnosis not present

## 2023-06-22 DIAGNOSIS — F419 Anxiety disorder, unspecified: Secondary | ICD-10-CM | POA: Diagnosis not present

## 2023-06-22 DIAGNOSIS — E1140 Type 2 diabetes mellitus with diabetic neuropathy, unspecified: Secondary | ICD-10-CM | POA: Diagnosis not present

## 2023-06-22 DIAGNOSIS — I10 Essential (primary) hypertension: Secondary | ICD-10-CM | POA: Diagnosis not present

## 2023-06-22 DIAGNOSIS — J9611 Chronic respiratory failure with hypoxia: Secondary | ICD-10-CM | POA: Diagnosis not present

## 2023-06-22 DIAGNOSIS — F32A Depression, unspecified: Secondary | ICD-10-CM | POA: Diagnosis not present

## 2023-06-22 DIAGNOSIS — Z6841 Body Mass Index (BMI) 40.0 and over, adult: Secondary | ICD-10-CM | POA: Diagnosis not present

## 2023-06-23 ENCOUNTER — Telehealth: Payer: Self-pay

## 2023-06-23 NOTE — Telephone Encounter (Signed)
 Called and LVM giving verbal orders per [name].   Copied from CRM [id number]. Topic: Clinical - Home Health Verbal Orders >> Jun 23, 2023  1:21 PM Macon Large wrote: Caller/Agency: Ryan with [facility] Callback Number: [phone] Service Requested: Skilled Nursing Frequency: requests order to assess increased swelling Any new concerns about the patient? No

## 2023-06-23 NOTE — Telephone Encounter (Signed)
 Okay for verbal orders.

## 2023-06-26 DIAGNOSIS — Z419 Encounter for procedure for purposes other than remedying health state, unspecified: Secondary | ICD-10-CM | POA: Diagnosis not present

## 2023-06-29 ENCOUNTER — Telehealth: Payer: Self-pay | Admitting: Pulmonary Disease

## 2023-06-29 ENCOUNTER — Ambulatory Visit: Payer: Medicaid Other | Admitting: Nurse Practitioner

## 2023-06-29 DIAGNOSIS — F419 Anxiety disorder, unspecified: Secondary | ICD-10-CM | POA: Diagnosis not present

## 2023-06-29 DIAGNOSIS — Z9981 Dependence on supplemental oxygen: Secondary | ICD-10-CM | POA: Diagnosis not present

## 2023-06-29 DIAGNOSIS — I10 Essential (primary) hypertension: Secondary | ICD-10-CM | POA: Diagnosis not present

## 2023-06-29 DIAGNOSIS — I890 Lymphedema, not elsewhere classified: Secondary | ICD-10-CM | POA: Diagnosis not present

## 2023-06-29 DIAGNOSIS — K219 Gastro-esophageal reflux disease without esophagitis: Secondary | ICD-10-CM | POA: Diagnosis not present

## 2023-06-29 DIAGNOSIS — J4489 Other specified chronic obstructive pulmonary disease: Secondary | ICD-10-CM | POA: Diagnosis not present

## 2023-06-29 DIAGNOSIS — F32A Depression, unspecified: Secondary | ICD-10-CM | POA: Diagnosis not present

## 2023-06-29 DIAGNOSIS — Z6841 Body Mass Index (BMI) 40.0 and over, adult: Secondary | ICD-10-CM | POA: Diagnosis not present

## 2023-06-29 DIAGNOSIS — E1140 Type 2 diabetes mellitus with diabetic neuropathy, unspecified: Secondary | ICD-10-CM | POA: Diagnosis not present

## 2023-06-29 DIAGNOSIS — E039 Hypothyroidism, unspecified: Secondary | ICD-10-CM | POA: Diagnosis not present

## 2023-06-29 DIAGNOSIS — J9611 Chronic respiratory failure with hypoxia: Secondary | ICD-10-CM | POA: Diagnosis not present

## 2023-06-29 DIAGNOSIS — E785 Hyperlipidemia, unspecified: Secondary | ICD-10-CM | POA: Diagnosis not present

## 2023-06-29 NOTE — Telephone Encounter (Signed)
 Patient needs to be requalified for oxygen. Patient uses [facility]. Patient phone number is [phone].

## 2023-06-29 NOTE — Telephone Encounter (Signed)
 Patient reports that [facility] is needing forms signed for medical necessity for oxygen. Please advise.

## 2023-06-30 DIAGNOSIS — I10 Essential (primary) hypertension: Secondary | ICD-10-CM | POA: Diagnosis not present

## 2023-06-30 DIAGNOSIS — J4489 Other specified chronic obstructive pulmonary disease: Secondary | ICD-10-CM | POA: Diagnosis not present

## 2023-06-30 DIAGNOSIS — F32A Depression, unspecified: Secondary | ICD-10-CM | POA: Diagnosis not present

## 2023-06-30 DIAGNOSIS — F419 Anxiety disorder, unspecified: Secondary | ICD-10-CM | POA: Diagnosis not present

## 2023-06-30 DIAGNOSIS — E1140 Type 2 diabetes mellitus with diabetic neuropathy, unspecified: Secondary | ICD-10-CM | POA: Diagnosis not present

## 2023-06-30 DIAGNOSIS — K219 Gastro-esophageal reflux disease without esophagitis: Secondary | ICD-10-CM | POA: Diagnosis not present

## 2023-06-30 DIAGNOSIS — I890 Lymphedema, not elsewhere classified: Secondary | ICD-10-CM | POA: Diagnosis not present

## 2023-06-30 DIAGNOSIS — Z9981 Dependence on supplemental oxygen: Secondary | ICD-10-CM | POA: Diagnosis not present

## 2023-06-30 DIAGNOSIS — E785 Hyperlipidemia, unspecified: Secondary | ICD-10-CM | POA: Diagnosis not present

## 2023-06-30 DIAGNOSIS — E039 Hypothyroidism, unspecified: Secondary | ICD-10-CM | POA: Diagnosis not present

## 2023-06-30 DIAGNOSIS — Z6841 Body Mass Index (BMI) 40.0 and over, adult: Secondary | ICD-10-CM | POA: Diagnosis not present

## 2023-06-30 DIAGNOSIS — J9611 Chronic respiratory failure with hypoxia: Secondary | ICD-10-CM | POA: Diagnosis not present

## 2023-06-30 NOTE — Telephone Encounter (Signed)
 What ever has been put on my box I have signed.  Leave I have signed these forms previously.

## 2023-06-30 NOTE — Telephone Encounter (Signed)
 [facility] advised that patient will need to be seen in office for CMN to be signed. Next office visit is scheduled for 07/20/23. Nothing further needed.

## 2023-07-01 ENCOUNTER — Ambulatory Visit (INDEPENDENT_AMBULATORY_CARE_PROVIDER_SITE_OTHER): Payer: Medicaid Other | Admitting: Vascular Surgery

## 2023-07-02 ENCOUNTER — Ambulatory Visit: Payer: Medicaid Other | Admitting: Pulmonary Disease

## 2023-07-07 DIAGNOSIS — J4489 Other specified chronic obstructive pulmonary disease: Secondary | ICD-10-CM | POA: Diagnosis not present

## 2023-07-07 DIAGNOSIS — K219 Gastro-esophageal reflux disease without esophagitis: Secondary | ICD-10-CM | POA: Diagnosis not present

## 2023-07-07 DIAGNOSIS — F419 Anxiety disorder, unspecified: Secondary | ICD-10-CM | POA: Diagnosis not present

## 2023-07-07 DIAGNOSIS — E785 Hyperlipidemia, unspecified: Secondary | ICD-10-CM | POA: Diagnosis not present

## 2023-07-07 DIAGNOSIS — E1140 Type 2 diabetes mellitus with diabetic neuropathy, unspecified: Secondary | ICD-10-CM | POA: Diagnosis not present

## 2023-07-07 DIAGNOSIS — Z9981 Dependence on supplemental oxygen: Secondary | ICD-10-CM | POA: Diagnosis not present

## 2023-07-07 DIAGNOSIS — F32A Depression, unspecified: Secondary | ICD-10-CM | POA: Diagnosis not present

## 2023-07-07 DIAGNOSIS — J9611 Chronic respiratory failure with hypoxia: Secondary | ICD-10-CM | POA: Diagnosis not present

## 2023-07-07 DIAGNOSIS — I10 Essential (primary) hypertension: Secondary | ICD-10-CM | POA: Diagnosis not present

## 2023-07-07 DIAGNOSIS — E039 Hypothyroidism, unspecified: Secondary | ICD-10-CM | POA: Diagnosis not present

## 2023-07-07 DIAGNOSIS — Z6841 Body Mass Index (BMI) 40.0 and over, adult: Secondary | ICD-10-CM | POA: Diagnosis not present

## 2023-07-07 DIAGNOSIS — I890 Lymphedema, not elsewhere classified: Secondary | ICD-10-CM | POA: Diagnosis not present

## 2023-07-13 ENCOUNTER — Other Ambulatory Visit: Payer: Medicaid Other

## 2023-07-19 ENCOUNTER — Telehealth: Payer: Self-pay | Admitting: Pulmonary Disease

## 2023-07-19 NOTE — Telephone Encounter (Signed)
 Pt called left message to schedule ov

## 2023-07-20 ENCOUNTER — Ambulatory Visit: Admitting: Pulmonary Disease

## 2023-07-20 ENCOUNTER — Ambulatory Visit: Payer: Self-pay | Admitting: Obstetrics and Gynecology

## 2023-07-20 ENCOUNTER — Other Ambulatory Visit: Payer: Self-pay

## 2023-07-20 ENCOUNTER — Telehealth: Payer: Self-pay

## 2023-07-20 NOTE — Patient Outreach (Signed)
  Care Management   Outreach Note  07/20/2023 Name: Cheryl Chandler MRN: [id number] DOB: [birth year]  An unsuccessful outreach attempt was made today for a scheduled Care Management visit.   Follow Up Plan:  A HIPAA compliant phone message was left for the patient providing contact information and requesting a return call.     Juanell Fairly [facility] Population Health RN Care Manager Direct Dial: [phone]  Fax: [phone] Website: [email or web address]

## 2023-07-21 DIAGNOSIS — E1140 Type 2 diabetes mellitus with diabetic neuropathy, unspecified: Secondary | ICD-10-CM | POA: Diagnosis not present

## 2023-07-21 DIAGNOSIS — J9611 Chronic respiratory failure with hypoxia: Secondary | ICD-10-CM | POA: Diagnosis not present

## 2023-07-21 DIAGNOSIS — E785 Hyperlipidemia, unspecified: Secondary | ICD-10-CM | POA: Diagnosis not present

## 2023-07-21 DIAGNOSIS — F419 Anxiety disorder, unspecified: Secondary | ICD-10-CM | POA: Diagnosis not present

## 2023-07-21 DIAGNOSIS — E039 Hypothyroidism, unspecified: Secondary | ICD-10-CM | POA: Diagnosis not present

## 2023-07-21 DIAGNOSIS — Z6841 Body Mass Index (BMI) 40.0 and over, adult: Secondary | ICD-10-CM | POA: Diagnosis not present

## 2023-07-21 DIAGNOSIS — I890 Lymphedema, not elsewhere classified: Secondary | ICD-10-CM | POA: Diagnosis not present

## 2023-07-21 DIAGNOSIS — I10 Essential (primary) hypertension: Secondary | ICD-10-CM | POA: Diagnosis not present

## 2023-07-21 DIAGNOSIS — Z9981 Dependence on supplemental oxygen: Secondary | ICD-10-CM | POA: Diagnosis not present

## 2023-07-21 DIAGNOSIS — K219 Gastro-esophageal reflux disease without esophagitis: Secondary | ICD-10-CM | POA: Diagnosis not present

## 2023-07-21 DIAGNOSIS — J4489 Other specified chronic obstructive pulmonary disease: Secondary | ICD-10-CM | POA: Diagnosis not present

## 2023-07-21 DIAGNOSIS — F32A Depression, unspecified: Secondary | ICD-10-CM | POA: Diagnosis not present

## 2023-07-22 ENCOUNTER — Ambulatory Visit (INDEPENDENT_AMBULATORY_CARE_PROVIDER_SITE_OTHER): Admitting: Vascular Surgery

## 2023-07-22 DIAGNOSIS — N3941 Urge incontinence: Secondary | ICD-10-CM | POA: Diagnosis not present

## 2023-07-27 ENCOUNTER — Other Ambulatory Visit: Payer: Self-pay

## 2023-07-27 NOTE — Progress Notes (Unsigned)
   07/27/2023  Patient ID: Cheryl Chandler, female   DOB: August 25, 1965, 58 y.o.   MRN: [id number]  Subjective/Objective Telephone visit to follow-up on management of diabetes and medication access/adherence  Diabetes Management -Current medications:  Trulicity 0.75mg  weekly -Patient does monitor home BG regularly- endorses 4 instances of BG in 60's-70's with s/sx of hypoglycemia resolved by eating canned peaches.  Otherwise, BG in goal range per patient. -Last A1c 5.8% on 05/01/23  Medication Access/Adherence -Patient continues to endorse challenges with paying Medicaid copays at pharmacy and is coming due for refills -[facility] had charge account for patient but will not allow charges over a certain amount if payments are being made.  [facility] has the same policy, and we have discussed patient filling prescriptions here; but she would run into the same issue currently facing. -Patient states someone was going to work with her to look into applying for Medicare to assist with medical/medication costs- states husband has dual enrollment.  Assessment/Plan  Diabetes Management -Controlled based on A1c <7% -Continue current regimen, regular monitoring of home BG, and regular follow-up with PCP -If patient continues to experience hypoglycemia, may need to consider stopping Trulicity- unlikely that dose is giving much benefit in regard to weight loss; and A1c well controlled  Medication Access/Adherence -Contacting SW to see if they can assist patient further in applying for Medicare  Lenna Gilford, PharmD, DPLA

## 2023-07-28 DIAGNOSIS — I10 Essential (primary) hypertension: Secondary | ICD-10-CM | POA: Diagnosis not present

## 2023-07-28 DIAGNOSIS — I890 Lymphedema, not elsewhere classified: Secondary | ICD-10-CM | POA: Diagnosis not present

## 2023-07-28 DIAGNOSIS — J9611 Chronic respiratory failure with hypoxia: Secondary | ICD-10-CM | POA: Diagnosis not present

## 2023-07-28 DIAGNOSIS — E785 Hyperlipidemia, unspecified: Secondary | ICD-10-CM | POA: Diagnosis not present

## 2023-07-28 DIAGNOSIS — E039 Hypothyroidism, unspecified: Secondary | ICD-10-CM | POA: Diagnosis not present

## 2023-07-28 DIAGNOSIS — E1140 Type 2 diabetes mellitus with diabetic neuropathy, unspecified: Secondary | ICD-10-CM | POA: Diagnosis not present

## 2023-07-28 DIAGNOSIS — Z9981 Dependence on supplemental oxygen: Secondary | ICD-10-CM | POA: Diagnosis not present

## 2023-07-28 DIAGNOSIS — Z6841 Body Mass Index (BMI) 40.0 and over, adult: Secondary | ICD-10-CM | POA: Diagnosis not present

## 2023-07-28 DIAGNOSIS — J4489 Other specified chronic obstructive pulmonary disease: Secondary | ICD-10-CM | POA: Diagnosis not present

## 2023-07-28 DIAGNOSIS — K219 Gastro-esophageal reflux disease without esophagitis: Secondary | ICD-10-CM | POA: Diagnosis not present

## 2023-07-28 DIAGNOSIS — F419 Anxiety disorder, unspecified: Secondary | ICD-10-CM | POA: Diagnosis not present

## 2023-07-28 DIAGNOSIS — F32A Depression, unspecified: Secondary | ICD-10-CM | POA: Diagnosis not present

## 2023-07-30 DIAGNOSIS — I10 Essential (primary) hypertension: Secondary | ICD-10-CM | POA: Diagnosis not present

## 2023-07-30 DIAGNOSIS — E785 Hyperlipidemia, unspecified: Secondary | ICD-10-CM | POA: Diagnosis not present

## 2023-07-30 DIAGNOSIS — E1140 Type 2 diabetes mellitus with diabetic neuropathy, unspecified: Secondary | ICD-10-CM | POA: Diagnosis not present

## 2023-07-30 DIAGNOSIS — Z9981 Dependence on supplemental oxygen: Secondary | ICD-10-CM | POA: Diagnosis not present

## 2023-07-30 DIAGNOSIS — Z6841 Body Mass Index (BMI) 40.0 and over, adult: Secondary | ICD-10-CM | POA: Diagnosis not present

## 2023-07-30 DIAGNOSIS — F419 Anxiety disorder, unspecified: Secondary | ICD-10-CM | POA: Diagnosis not present

## 2023-07-30 DIAGNOSIS — E039 Hypothyroidism, unspecified: Secondary | ICD-10-CM | POA: Diagnosis not present

## 2023-07-30 DIAGNOSIS — J4489 Other specified chronic obstructive pulmonary disease: Secondary | ICD-10-CM | POA: Diagnosis not present

## 2023-07-30 DIAGNOSIS — I890 Lymphedema, not elsewhere classified: Secondary | ICD-10-CM | POA: Diagnosis not present

## 2023-07-30 DIAGNOSIS — K219 Gastro-esophageal reflux disease without esophagitis: Secondary | ICD-10-CM | POA: Diagnosis not present

## 2023-07-30 DIAGNOSIS — J9611 Chronic respiratory failure with hypoxia: Secondary | ICD-10-CM | POA: Diagnosis not present

## 2023-07-30 DIAGNOSIS — F32A Depression, unspecified: Secondary | ICD-10-CM | POA: Diagnosis not present

## 2023-08-03 ENCOUNTER — Ambulatory Visit: Payer: Medicaid Other | Admitting: Podiatry

## 2023-08-03 DIAGNOSIS — B351 Tinea unguium: Secondary | ICD-10-CM

## 2023-08-03 DIAGNOSIS — M79675 Pain in left toe(s): Secondary | ICD-10-CM

## 2023-08-03 DIAGNOSIS — M79674 Pain in right toe(s): Secondary | ICD-10-CM

## 2023-08-03 NOTE — Progress Notes (Signed)
  Subjective:  Patient ID: Cheryl Chandler, female    DOB: 1965-10-16,  MRN: [id number]  Chief Complaint  Patient presents with   Nail Problem   58 y.o. female returns for the above complaint.  Patient presents with thickened elongated dystrophic mycotic toenails x 10 mild pain on palpation hurts with ambulation worse with pressure patient would like to have it debrided down she is not able to do it herself denies any other acute complaints.  Objective:  There were no vitals filed for this visit. Podiatric Exam: Vascular: dorsalis pedis and posterior tibial pulses are palpable bilateral. Capillary return is immediate. Temperature gradient is WNL. Skin turgor WNL  Sensorium: Normal Semmes Weinstein monofilament test. Normal tactile sensation bilaterally. Nail Exam: Pt has thick disfigured discolored nails with subungual debris noted bilateral entire nail hallux through fifth toenails.  Pain on palpation to the nails. Ulcer Exam: There is no evidence of ulcer or pre-ulcerative changes or infection. Orthopedic Exam: Muscle tone and strength are WNL. No limitations in general ROM. No crepitus or effusions noted.  Skin: No Porokeratosis. No infection or ulcers    Assessment & Plan:   1. Pain due to onychomycosis of toenails of both feet     Patient was evaluated and treated and all questions answered.  Onychomycosis with pain  -Nails palliatively debrided as below. -Educated on self-care  Procedure: Nail Debridement Rationale: pain  Type of Debridement: manual, sharp debridement. Instrumentation: Nail nipper, rotary burr. Number of Nails: 10  Procedures and Treatment: Consent by patient was obtained for treatment procedures. The patient understood the discussion of treatment and procedures well. All questions were answered thoroughly reviewed. Debridement of mycotic and hypertrophic toenails, 1 through 5 bilateral and clearing of subungual debris. No ulceration, no infection  noted.  Return Visit-Office Procedure: Patient instructed to return to the office for a follow up visit 3 months for continued evaluation and treatment.  Nicholes Rough, DPM    Return in about 3 months (around 11/02/2023).

## 2023-08-07 DIAGNOSIS — Z419 Encounter for procedure for purposes other than remedying health state, unspecified: Secondary | ICD-10-CM | POA: Diagnosis not present

## 2023-08-10 ENCOUNTER — Telehealth: Payer: Self-pay

## 2023-08-10 NOTE — Telephone Encounter (Signed)
 The patient had an appt 3/25 with Dr. Viva Grise to re-qualify her for O2. The patient called and rescheduled that appt to 6/10.  ATC Mindy with [facility] and I left a detailed message on her secure voicemail.  E2C2 please advise when Mindy calls back.   Nothing further needed.

## 2023-08-10 NOTE — Telephone Encounter (Signed)
 Copied from CRM [id number]. Topic: Medical Record Request - Provider/Facility Request >> Aug 10, 2023  3:42 PM Hilton Lucky wrote: Reason for CRM: Mindy with [facility] calling to obtain office notes pertaining to oxygen needs and usage within the last six months. On first observation, no office notes from [facility] seem to be present within this time frame, only telephone messages.  Patient has had a few cancelled appointments within this time frame and does have an upcoming appointment in June. Mindy states needs some official documentation stating the usage, benefit, and directions for oxygen and why the patient is on it.  Fax Number: [phone] C/B: [phone] ext [phone] - voicemail secure

## 2023-08-20 DIAGNOSIS — N3941 Urge incontinence: Secondary | ICD-10-CM | POA: Diagnosis not present

## 2023-08-23 ENCOUNTER — Other Ambulatory Visit: Payer: Self-pay

## 2023-09-06 DIAGNOSIS — Z419 Encounter for procedure for purposes other than remedying health state, unspecified: Secondary | ICD-10-CM | POA: Diagnosis not present

## 2023-09-07 ENCOUNTER — Ambulatory Visit: Payer: Medicaid Other | Admitting: Nurse Practitioner

## 2023-09-08 ENCOUNTER — Ambulatory Visit: Admitting: Nurse Practitioner

## 2023-09-22 DIAGNOSIS — N3941 Urge incontinence: Secondary | ICD-10-CM | POA: Diagnosis not present

## 2023-09-24 ENCOUNTER — Telehealth: Payer: Self-pay

## 2023-09-24 NOTE — Telephone Encounter (Signed)
 I spoke with the patient. She is aware that we have to see her in the office before Dr. Viva Grise can re-qualify her for O2. She does have an appt on 6/10 that she is aware of.  Nothing further needed.

## 2023-09-24 NOTE — Telephone Encounter (Signed)
 Copied from CRM [id number]. Topic: General - Other >> Sep 24, 2023 11:31 AM Cheryl Chandler wrote: Reason for CRM: patient is calling cause patient is on oxygen  and is with [facility] and they need necessary documentation to support her using oxygen  and for patient insurance to continue to cover . Patient doesn't know if Cheryl Chandler needs to send paperwork to them or how this work . Please reach out to patient with more information [phone]

## 2023-09-28 ENCOUNTER — Other Ambulatory Visit: Payer: Self-pay | Admitting: Physician Assistant

## 2023-09-28 DIAGNOSIS — E039 Hypothyroidism, unspecified: Secondary | ICD-10-CM

## 2023-09-29 NOTE — Telephone Encounter (Signed)
 Requested Prescriptions  Pending Prescriptions Disp Refills   levothyroxine  (SYNTHROID ) 50 MCG tablet [Pharmacy Med Name: LEVOTHYROXINE  SODIUM 50 MCG TAB] 90 tablet 0    Sig: TAKE 1 TABLET BY MOUTH ONCE DAILY ON AN EMPTY STOMACH. WAIT 30 MINUTES BEFORE TAKING OTHER MEDS.     Endocrinology:  Hypothyroid Agents Failed - 09/29/2023  4:09 PM      Failed - TSH in normal range and within 360 days    TSH  Date Value Ref Range Status  08/03/2022 2.760 0.450 - 4.500 uIU/mL Final         Passed - Valid encounter within last 12 months    Recent Outpatient Visits           3 months ago Type 2 diabetes mellitus with foot ulcer, without long-term current use of insulin  [facility])   [place] [facility] [place], Mariah Shines, NP   3 months ago Chronic obstructive pulmonary disease, unspecified COPD type [facility])   [place] [facility] Aileen Alexanders, NP       Future Appointments             In 4 months Artemio Larry, MD [place] [facility]             citalopram  (CELEXA ) 40 MG tablet [Pharmacy Med Name: CITALOPRAM  HYDROBROMIDE 40 MG TAB] 90 tablet 0    Sig: TAKE 1 TABLET BY MOUTH ONCE DAILY     Psychiatry:  Antidepressants - SSRI Passed - 09/29/2023  4:09 PM      Passed - Completed PHQ-2 or PHQ-9 in the last 360 days      Passed - Valid encounter within last 6 months    Recent Outpatient Visits           3 months ago Type 2 diabetes mellitus with foot ulcer, without long-term current use of insulin  [facility])   [place] [facility] Aileen Alexanders, NP   3 months ago Chronic obstructive pulmonary disease, unspecified COPD type [facility])    [facility] Aileen Alexanders, NP       Future Appointments             In 4 months Artemio Larry, MD [facility]

## 2023-09-29 NOTE — Telephone Encounter (Signed)
 Requested medication (s) are due for refill today -yes  Requested medication (s) are on the active medication list -yes  Future visit scheduled -no  Last refill: 11/09/22 90  Notes to clinic: fails lab protocol- over 1 year- 08/03/22  Requested Prescriptions  Pending Prescriptions Disp Refills   levothyroxine  (SYNTHROID ) 50 MCG tablet [Pharmacy Med Name: LEVOTHYROXINE  SODIUM 50 MCG TAB] 90 tablet 0    Sig: TAKE 1 TABLET BY MOUTH ONCE DAILY ON AN EMPTY STOMACH. WAIT 30 MINUTES BEFORE TAKING OTHER MEDS.     Endocrinology:  Hypothyroid Agents Failed - 09/29/2023  4:10 PM      Failed - TSH in normal range and within 360 days    TSH  Date Value Ref Range Status  08/03/2022 2.760 0.450 - 4.500 uIU/mL Final         Passed - Valid encounter within last 12 months    Recent Outpatient Visits           3 months ago Type 2 diabetes mellitus with foot ulcer, without long-term current use of insulin  [facility])   [facility] [place], Mariah Shines, NP   3 months ago Chronic obstructive pulmonary disease, unspecified COPD type [facility])   [place]  [facility] Aileen Alexanders, NP       Future Appointments             In 4 months Artemio Larry, MD [facility] [place]            Signed Prescriptions Disp Refills   citalopram  (CELEXA ) 40 MG tablet 90 tablet 0    Sig: TAKE 1 TABLET BY MOUTH ONCE DAILY     Psychiatry:  Antidepressants - SSRI Passed - 09/29/2023  4:10 PM      Passed - Completed PHQ-2 or PHQ-9 in the last 360 days      Passed - Valid encounter within last 6 months    Recent Outpatient Visits           3 months ago Type 2 diabetes mellitus with foot ulcer, without long-term current use of insulin  (HCC)   [place] [facility] [place], Mariah Shines, NP   3 months ago Chronic obstructive pulmonary disease, unspecified COPD type (HCC)   [place] Aileen Alexanders, NP       Future Appointments              In 4 months Artemio Larry, MD [facility]               Requested Prescriptions  Pending Prescriptions Disp Refills   levothyroxine  (SYNTHROID ) 50 MCG tablet [Pharmacy Med Name: LEVOTHYROXINE  SODIUM 50 MCG TAB] 90 tablet 0    Sig: TAKE 1 TABLET BY MOUTH ONCE DAILY ON AN EMPTY STOMACH. WAIT 30 MINUTES BEFORE TAKING OTHER MEDS.     Endocrinology:  Hypothyroid Agents Failed - 09/29/2023  4:10 PM      Failed - TSH in normal range and within 360 days    TSH  Date Value Ref Range Status  08/03/2022 2.760 0.450 - 4.500 uIU/mL Final         Passed - Valid encounter within last 12 months    Recent Outpatient Visits           3 months ago Type 2 diabetes mellitus with foot ulcer, without long-term current use of insulin  [facility])    [facility] Aileen Alexanders, NP   3 months ago Chronic  obstructive pulmonary disease, unspecified COPD type [facility])   [place] [facility] Aileen Alexanders, NP       Future Appointments             In 4 months Artemio Larry, MD [facility]            Signed Prescriptions Disp Refills   citalopram  (CELEXA ) 40 MG tablet 90 tablet 0    Sig: TAKE 1 TABLET BY MOUTH ONCE DAILY     Psychiatry:  Antidepressants - SSRI Passed - 09/29/2023  4:10 PM      Passed - Completed PHQ-2 or PHQ-9 in the last 360 days      Passed - Valid encounter within last 6 months    Recent Outpatient Visits           3 months ago Type 2 diabetes mellitus with foot ulcer, without long-term current use of insulin  [facility])   [place] [facility] Aileen Alexanders, NP   3 months ago Chronic obstructive pulmonary disease, unspecified COPD type [facility])   [place] [facility] Aileen Alexanders, NP       Future Appointments             In 4 months Artemio Larry, MD [facility]

## 2023-09-30 ENCOUNTER — Other Ambulatory Visit: Payer: Self-pay | Admitting: Nurse Practitioner

## 2023-10-01 NOTE — Telephone Encounter (Signed)
 Requested medication (s) are due for refill today:   Provider to review   Rx from 2023  Requested medication (s) are on the active medication list:   Yes  Future visit scheduled:   No   Last ordered: 10/03/2021 #90, 3 refills  Rx from 2023   Provider to review for refills.   Requested Prescriptions  Pending Prescriptions Disp Refills   meloxicam  (MOBIC ) 15 MG tablet [Pharmacy Med Name: MELOXICAM  15 MG TAB] 90 tablet 3    Sig: TAKE 1 TABLET BY MOUTH ONCE DAILY AS NEEDED WITH FOOD OR MILK     Analgesics:  COX2 Inhibitors Failed - 10/01/2023 12:29 PM      Failed - Manual Review: Labs are only required if the patient has taken medication for more than 8 weeks.      Failed - HGB in normal range and within 360 days    Hemoglobin  Date Value Ref Range Status  05/03/2023 10.1 (L) 12.0 - 15.0 g/dL Final  52/84/1324 40.1 11.1 - 15.9 g/dL Final         Failed - HCT in normal range and within 360 days    HCT  Date Value Ref Range Status  05/03/2023 31.1 (L) 36.0 - 46.0 % Final   Hematocrit  Date Value Ref Range Status  06/04/2021 38.5 34.0 - 46.6 % Final         Passed - Cr in normal range and within 360 days    Creatinine  Date Value Ref Range Status  08/05/2014 0.86 mg/dL Final    Comment:    0.27-2.53 NOTE: New Reference Range  07/03/14    Creatinine, Ser  Date Value Ref Range Status  05/03/2023 0.88 0.44 - 1.00 mg/dL Final         Passed - AST in normal range and within 360 days    AST  Date Value Ref Range Status  05/01/2023 26 15 - 41 U/L Final   SGOT(AST)  Date Value Ref Range Status  08/05/2014 30 U/L Final    Comment:    15-41 NOTE: New Reference Range  07/03/14          Passed - ALT in normal range and within 360 days    ALT  Date Value Ref Range Status  05/01/2023 27 0 - 44 U/L Final   SGPT (ALT)  Date Value Ref Range Status  08/05/2014 31 U/L Final    Comment:    14-54 NOTE: New Reference Range  07/03/14          Passed - eGFR is 30 or  above and within 360 days    EGFR (African American)  Date Value Ref Range Status  08/05/2014 >60  Final   GFR calc Af Amer  Date Value Ref Range Status  04/03/2020 92 >59 mL/min/1.73 Final    Comment:    **In accordance with recommendations from the NKF-ASN Task force,**   [facility] is in the process of updating its eGFR calculation to the   2021 CKD-EPI creatinine equation that estimates kidney function   without a race variable.    EGFR (Non-African Amer.)  Date Value Ref Range Status  08/05/2014 >60  Final    Comment:    eGFR values <22mL/min/1.73 m2 may be an indication of chronic kidney disease (CKD). Calculated eGFR is useful in patients with stable renal function. The eGFR calculation will not be reliable in acutely ill patients when serum creatinine is changing rapidly. It is not useful in  patients on dialysis. The eGFR calculation may not be applicable to patients at the low and high extremes of body sizes, pregnant women, and vegetarians.    GFR, Estimated  Date Value Ref Range Status  05/03/2023 >60 >60 mL/min Final    Comment:    (NOTE) Calculated using the CKD-EPI Creatinine Equation (2021)    eGFR  Date Value Ref Range Status  08/03/2022 102 >59 mL/min/1.73 Final         Passed - Patient is not pregnant      Passed - Valid encounter within last 12 months    Recent Outpatient Visits           3 months ago Type 2 diabetes mellitus with foot ulcer, without long-term current use of insulin  [facility])   [facility] Aileen Alexanders, NP   3 months ago Chronic obstructive pulmonary disease, unspecified COPD type [facility])   [place] [facility] Aileen Alexanders, NP       Future Appointments             In 4 months Artemio Larry, MD [facility]

## 2023-10-05 ENCOUNTER — Ambulatory Visit: Admitting: Pulmonary Disease

## 2023-10-05 ENCOUNTER — Encounter: Payer: Self-pay | Admitting: Pulmonary Disease

## 2023-10-05 VITALS — BP 136/60 | HR 77 | Temp 99.0°F | Ht 64.0 in | Wt 304.0 lb

## 2023-10-05 DIAGNOSIS — I890 Lymphedema, not elsewhere classified: Secondary | ICD-10-CM | POA: Diagnosis not present

## 2023-10-05 DIAGNOSIS — E669 Obesity, unspecified: Secondary | ICD-10-CM

## 2023-10-05 DIAGNOSIS — E662 Morbid (severe) obesity with alveolar hypoventilation: Secondary | ICD-10-CM | POA: Diagnosis not present

## 2023-10-05 DIAGNOSIS — R0602 Shortness of breath: Secondary | ICD-10-CM

## 2023-10-05 DIAGNOSIS — J449 Chronic obstructive pulmonary disease, unspecified: Secondary | ICD-10-CM | POA: Diagnosis not present

## 2023-10-05 MED ORDER — TRELEGY ELLIPTA 100-62.5-25 MCG/ACT IN AEPB: 1.0000 | INHALATION_SPRAY | Freq: Every day | RESPIRATORY_TRACT | 6 refills | Status: AC

## 2023-10-05 NOTE — Patient Instructions (Addendum)
 VISIT SUMMARY:  Today, we discussed your ongoing symptoms related to COPD, including persistent shortness of breath, cough, and wheezing. We also reviewed your recent relapse in smoking, your management of lymphedema, and your mental health concerns, including anxiety, depression, and PTSD. We addressed your emotional distress due to recent family losses and discussed your goals of care.  YOUR PLAN:  -CHRONIC OBSTRUCTIVE PULMONARY DISEASE (COPD): COPD is a chronic lung condition that makes it hard to breathe. You will continue using Trelegy and supplemental oxygen . We will also order an echocardiogram to check your heart function and refill your Nutrilyte prescription.  -NICOTINE DEPENDENCE, CIGARETTES: Nicotine dependence means you are addicted to smoking. You have recently started smoking again due to stress. We discussed your desire to quit and your past success in quitting.  -LYMPHEDEMA: Lymphedema is swelling in your legs due to fluid buildup. You are managing it with a lymphedema machine and under the care of Dr. Prescilla Brod.  -DEPRESSION: Depression is a mental health condition that causes persistent sadness and loss of interest. Your depression has been worse due to recent family losses, and you are on medication to help manage it.  -POST-TRAUMATIC STRESS DISORDER (PTSD): PTSD is a mental health condition triggered by a traumatic event. Your symptoms have been worse due to recent family stress, and you are engaging in crafts and activities to help manage them.  -ANXIETY DISORDER, UNSPECIFIED: Anxiety disorder causes excessive worry and fear. Your anxiety has been worse due to recent stress and family losses, and you are on medication to help manage it.  INSTRUCTIONS:  We will schedule an echocardiogram to assess your heart function. Please continue using your Trelegy and supplemental oxygen  as prescribed. Refill your Nutrilyte prescription. If you need support to quit smoking, consider  reaching out to a smoking cessation program. Continue managing your lymphedema with the machine and follow Dr. Delphia Ficks advice. Keep taking your medications for depression and anxiety, and engage in activities that help you manage your mental health. If you have any new or worsening symptoms, please contact our office.

## 2023-10-05 NOTE — Progress Notes (Signed)
 Subjective:    Patient ID: Cheryl Chandler, female    DOB: 09-Mar-1966, 58 y.o.   MRN: [id number]  Patient Care Team: Aileen Alexanders, NP as PCP - General (Nurse Practitioner) Hunt Magyar as Social Worker Linn Rich, [facility] (Pharmacist) Roxie Cord, RN as Case Manager Roxie Cord, RN as [facility] Care Management  Chief Complaint  Patient presents with   Follow-up    Cough and wheezing. Shortness of breath.     BACKGROUND:[name] is a 58 year old former smoker with a 90-pack-year history of smoking and a history as noted below who presents for follow-up on the issue of dyspnea and cough. The patient was initially seen here on 02 February 2022.  Last visit here was 10 August 2022.  She states that her dyspnea is at baseline though she feels that Trelegy Ellipta  does help her with this.    HPI Discussed the use of AI scribe software for clinical note transcription with the patient, who gave verbal consent to proceed.  History of Present Illness   Cheryl Chandler is a 58 year old female with COPD who presents with dyspnea, cough, and wheezing.  She experiences persistent shortness of breath, which is present all the time. Her current medication, Trelegy, provides some relief but does not completely alleviate her symptoms. She uses supplemental oxygen  during the day and at night.    She has a history of smoking and has recently resumed smoking, with a pack lasting her two days to a week, particularly when she feels upset. She is attempting to quit again, as she has done in the past.  She experiences swelling in her legs and has a history of lymphedema. She uses a lymphedema machine and has attended a lymphedema clinic. Her mother and uncles had similar issues, and she is trying to manage this condition by watching her diet.  She has a history of anxiety, depression, and post-traumatic stress disorder, for which she is on medication. She engages in activities like  crafting, making wreaths, crocheting, and doing graphic books to manage her mental health.  She mentions a recent episode of claustrophobia that exacerbated her breathing difficulties. She recalls a past hospital visit where she was informed of nerve damage affecting blood flow to her heart, which might be contributing to her shortness of breath.  She has been dealing with significant family stress, including multiple recent deaths in the family, which she finds overwhelming.   She is severely limited in mobility due to morbid obesity.     DATA 02/07/2022 overnight oximetry: Significant oxygen  desaturations to 84% with cyclical variation.  Patient would benefit from split-night sleep study however patient states unable to do.  Supplemental oxygen  initiated. 03/03/2022 overnight oximetry on O2: Patient with persistent desaturations at 2 L/min.  Saturations as low as 81%.  Titrated to 3 L/min. 06/23/2022 PFTs: Patient was not able to produce maneuvers well.  FEV1 0.91 L or 36% predicted, FVC 1.10 L or 34% predicted, FEV1/FVC 83%.  Lung volumes reduced ERV severely reduced at 13% consistent with obesity. Diffusion capacity normal.  There is reversibility of 23% in FEV1 postbronchodilator. 07/03/2022 echocardiogram: LVEF 55 to 60%, grade 1 diastolic dysfunction, normal pulmonary artery systolic pressure.  Mild mitral regurgitation.  No aortic valve abnormalities.  Review of Systems A 10 point review of systems was performed and it is as noted above otherwise negative.   Past Medical History:  Diagnosis Date   Arthritis    Asthma    COPD (chronic  obstructive pulmonary disease) (HCC)    Hypertension    Neuropathy    Pericarditis    Personal history of urinary calculi 11/29/2014   Thyroid  disease     Past Surgical History:  Procedure Laterality Date   APPENDECTOMY     arm surgery Left    pins   fatty tumor removed from back     JOINT REPLACEMENT Right    LEG SURGERY Bilateral    OVARIAN  CYST SURGERY Right    took piece of right ovary due to cyst   TOTAL HIP ARTHROPLASTY Right    wisdom teeth removed      Patient Active Problem List   Diagnosis Date Noted   Adnexal mass 05/03/2023   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 05/02/2023   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   Hypothyroidism 05/01/2023   T2DM (type 2 diabetes mellitus) (HCC) 05/01/2023   Dermatitis 02/02/2023   Moderate episode of recurrent major depressive disorder (HCC) 08/03/2022   Controlled type 2 diabetes mellitus with diabetic polyneuropathy, without long-term current use of insulin  (HCC) 08/03/2022   Foot pain, right 09/01/2021   Fibrocystic disease of both breasts 05/07/2019   Chronic venous insufficiency 11/09/2018   Overactive bladder 09/30/2018   Peripheral neuropathy 09/30/2018   Apnea 01/02/2016   Lymphedema 11/30/2014   Dyspnea on exertion 11/30/2014   Anxiety 11/29/2014   Arthritis 11/29/2014   COPD (chronic obstructive pulmonary disease) (HCC) 11/29/2014   Clinical depression 11/29/2014   H/O eating disorder 11/29/2014   Esophageal reflux 11/29/2014   Acne inversa 11/29/2014   Essential hypertension 11/29/2014   HLD (hyperlipidemia) 11/29/2014   Adult hypothyroidism 11/29/2014   Insomnia 11/29/2014   Low back pain 11/29/2014   Morbid obesity (HCC) 11/29/2014   Posttraumatic stress disorder 11/29/2014   Vitamin D  deficiency 11/29/2014   Nicotine dependence, cigarettes, uncomplicated 11/29/2014    Family History  Problem Relation Age of Onset   Hyperlipidemia Mother    Hypertension Mother    Heart disease Mother    Seizures Sister        disorders    Social History   Tobacco Use   Smoking status: Every Day    Current packs/day: 0.50    Average packs/day: 2.9 packs/day for 30.8 years (90.4 ttl pk-yrs)    Types: Cigarettes    Start date: 01/1992    Last attempt to quit: 01/2022   Smokeless tobacco: Never   Tobacco comments:    Smokes 1 PPD; 12/18/2022.   Substance Use Topics   Alcohol use: No    Alcohol/week: 0.0 standard drinks of alcohol    Allergies  Allergen Reactions   Codeine Shortness Of Breath and Rash   Percocet [Oxycodone-Acetaminophen ] Shortness Of Breath   Sulfa Antibiotics Shortness Of Breath and Rash   Aspirin Hives   Bee Venom     Bee stings   Darvon [Propoxyphene]     Darvocet-N 100   Latex    Oxycodone Nausea And Vomiting   Penicillins     Has patient had a PCN reaction causing immediate rash, facial/tongue/throat swelling, SOB or lightheadedness with hypotension: Unknown Has patient had a PCN reaction causing severe rash involving mucus membranes or skin necrosis: Unknown Has patient had a PCN reaction that required hospitalization: Unknown Has patient had a PCN reaction occurring within the last 10 years: Unknown If all of the above answers are NO, then may proceed with Cephalosporin use.    Current Meds  Medication Sig   Accu-Chek Softclix  Lancets lancets Use to check blood glucose up to three times daily   albuterol  (PROVENTIL ) (2.5 MG/3ML) 0.083% nebulizer solution USE 1 VIAL VIA NEBULIZER EVERY 4 HOURS AS NEEDED   atorvastatin  (LIPITOR) 80 MG tablet Take 1 tablet (80 mg total) by mouth daily.   Blood Glucose Monitoring Suppl (ACCU-CHEK GUIDE) w/Device KIT Use to check blood sugar 3 times a day.   Blood Pressure Monitoring (OMRON 3 SERIES BP MONITOR) DEVI Use to check blood pressure as directed   citalopram  (CELEXA ) 40 MG tablet TAKE 1 TABLET BY MOUTH ONCE DAILY   diphenhydrAMINE (BENADRYL) 25 mg capsule Take 25 mg by mouth every 6 (six) hours as needed for itching. Taking once a day   doxycycline  (VIBRA -TABS) 100 MG tablet Take 1 tablet (100 mg total) by mouth 2 (two) times daily.   Dulaglutide  (TRULICITY ) 0.75 MG/0.5ML SOAJ 0.75 mg by ultrasound guided injection route once a week.   furosemide  (LASIX ) 40 MG tablet Take 1 tablet (40 mg total) by mouth daily.   gabapentin  (NEURONTIN ) 300 MG capsule TAKE  1 CAPSULE BY MOUTH 3 TIMES DAILY ASNEEDED (NEED OFFICE VISIT FOR FURTHER REFILLS) (Patient taking differently: Take 600 mg by mouth 3 (three) times daily.)   glucose blood (ACCU-CHEK GUIDE TEST) test strip Check blood glucose up to three times daily   hydrALAZINE  (APRESOLINE ) 50 MG tablet Take 50 mg by mouth 3 (three) times daily.   hydrOXYzine  (VISTARIL ) 25 MG capsule Take 1 capsule (25 mg total) by mouth every 8 (eight) hours as needed.   levothyroxine  (SYNTHROID ) 50 MCG tablet TAKE 1 TABLET BY MOUTH ONCE DAILY ON AN EMPTY STOMACH. WAIT 30 MINUTES BEFORE TAKING OTHER MEDS.   lisinopril  (ZESTRIL ) 40 MG tablet Take 1 tablet (40 mg total) by mouth daily.   meloxicam  (MOBIC ) 15 MG tablet TAKE 1 TABLET BY MOUTH ONCE DAILY AS NEEDED WITH FOOD OR MILK   methocarbamol  (ROBAXIN ) 500 MG tablet Take 1 tablet (500 mg total) by mouth in the morning and at bedtime.   montelukast  (SINGULAIR ) 10 MG tablet Take 1 tablet (10 mg total) by mouth at bedtime.   nitroGLYCERIN  (NITROSTAT ) 0.4 MG SL tablet DISSOLVE 1 TABLET UNDER TONGUE AT ONSET OF CHEST PAIN. REPEAT IN 5 MIN IF NOT RESOLVED, MAX 3 DOSES. 911 IF NEEDED.   omeprazole  (PRILOSEC) 20 MG capsule Take 1 capsule (20 mg total) by mouth daily.   ondansetron  (ZOFRAN -ODT) 4 MG disintegrating tablet Take 1 tablet (4 mg total) by mouth every 6 (six) hours as needed for nausea or vomiting.   OXYGEN  Inhale 3 L into the lungs daily.   Silver (AQUACEL AG FOAM) 8X8 PADS Apply to areas of ulceration on legs as directed   Vitamin D , Ergocalciferol , (DRISDOL ) 1.25 MG (50000 UNIT) CAPS capsule TAKE 1 CAPSULE BY MOUTH EVERY 7 DAYS   Wound Cleansers (VASHE WOUND) 0.033 % SOLN Apply 1 Application topically daily. Use as directed to cleanse lower legs skin   [DISCONTINUED] Fluticasone -Umeclidin-Vilant (TRELEGY ELLIPTA ) 100-62.5-25 MCG/ACT AEPB Inhale 1 puff into the lungs daily. To resume once Breo and Incruse on hand is completed.    Immunization History  Administered  Date(s) Administered   Influenza, Seasonal, Injecte, Preservative Fre 02/09/2023   Influenza,inj,Quad PF,6+ Mos 01/30/2013, 01/29/2014, 01/13/2019   Influenza-Unspecified 05/02/2008, 07/01/2009   Moderna SARS-COV2 Booster Vaccination 11/05/2020   Moderna Sars-Covid-2 Vaccination 12/25/2019, 01/22/2020, 01/22/2020   Pneumococcal Polysaccharide-23 01/30/2013   Tdap 09/18/2021   Zoster Recombinant(Shingrix ) 09/18/2021, 12/16/2021        Objective:  BP 136/60 (BP Location: Right Wrist, Patient Position: Sitting, Cuff Size: Normal)   Pulse 77   Temp 99 F (37.2 C) (Oral)   Ht 5' 4 (1.626 m)   Wt (!) 304 lb (137.9 kg)   LMP 06/09/2017 (Approximate)   SpO2 97%   BMI 52.18 kg/m   SpO2: 97 %  GENERAL: Morbidly obese woman, poorly kempt, presents in wheelchair. No conversational dyspnea.  No distress. HEAD: Normocephalic, atraumatic.  EYES: Pupils equal, round, reactive to light.  No scleral icterus.  MOUTH: Edentulous, oral mucosa moist.  No thrush. NECK: Supple. No thyromegaly. Trachea midline. No JVD.  No adenopathy. PULMONARY: Good air entry bilaterally.  No adventitious sounds. CARDIOVASCULAR: S1 and S2. Regular rate and rhythm.  Distant heart tones.  No rubs murmurs or gallops noted. ABDOMEN: Protuberant/obese, otherwise benign. MUSCULOSKELETAL: No joint deformity, no clubbing.  She has significant lymphedema of the lower extremities, difficult to assess due to nonpitting edema. NEUROLOGIC: Grossly nonfocal.  Gait not tested. SKIN: Intact,warm,dry.  Multiple excoriations in the upper extremities.  Significant chronic stasis dermatitis changes bilateral extremities.  Skin is lichenified in the lower extremities. PSYCH: Flat affect.  Passive behavior.    Assessment & Plan:     ICD-10-CM   1. COPD, moderate (HCC)  J44.9 Overnight Pulse Oximetry Study    2. SOB (shortness of breath)  R06.02 ECHOCARDIOGRAM COMPLETE    3. Extreme obesity with alveolar hypoventilation (HCC)   E66.2 Overnight Pulse Oximetry Study    4. Lymphedema associated with obesity  I89.0    E66.9       Orders Placed This Encounter  Procedures   Overnight Pulse Oximetry Study    PTs. DME is [facility]    Standing Status:   Future    Expiration Date:   10/04/2024    Scheduling Instructions:     On room air.   ECHOCARDIOGRAM COMPLETE    Standing Status:   Future    Expected Date:   11/04/2023    Expiration Date:   10/04/2024    Where should this test be performed:    [facility]    Please indicate who you request to read the nuc med / echo results.:   [facility] Read[facility]    Perflutren  DEFINITY  (image enhancing agent) should be administered unless hypersensitivity or allergy exist:   Administer Perflutren     Reason for exam-Echo:   Dyspnea  R06.00    Meds ordered this encounter  Medications   Fluticasone -Umeclidin-Vilant (TRELEGY ELLIPTA ) 100-62.5-25 MCG/ACT AEPB    Sig: Inhale 1 puff into the lungs daily. To resume once Breo and Incruse on hand is completed.    Dispense:  60 each    Refill:  6   Assessment and Plan    Chronic Obstructive Pulmonary Disease (COPD) COPD with persistent dyspnea and wheezing. Continues Trelegy with some benefit but reports persistent shortness of breath. Requires oxygen  therapy day and night. Recent viral infections may exacerbate symptoms. Possible cardiac contribution to dyspnea. - Order echocardiogram to assess cardiac function - Refill Trelegy prescription  Nicotine dependence, cigarettes Ongoing nicotine dependence with recent relapse. Smoking approximately half a pack to a pack every two days to a week, often triggered by stress and emotional distress. Expressed desire to quit and has successfully quit in the past.  Chronic Kidney Disease, unspecified Chronic kidney disease mentioned in the context of family history and stress-related health concerns.  Lymphedema Chronic lymphedema with leg swelling. Managed with a lymphedema machine and  under the care  of Dr. Cleopatra Dada.  Depression Chronic depression exacerbated by multiple family bereavements and stress. On medication for depression.  Post-Traumatic Stress Disorder (PTSD) PTSD with symptoms exacerbated by recent family losses and stress. Engages in crafts and activities to manage symptoms.  Anxiety disorder, unspecified Chronic anxiety exacerbated by stress and family bereavements. On medication for anxiety. Reports claustrophobia contributing to breathing difficulties in tight spaces.  Goals of Care Significant emotional distress due to multiple family deaths and a relative's experience in hospice care. No specific advanced directives discussed, but heightened awareness of end-of-life issues.     Advised if symptoms do not improve or worsen, to please contact office for sooner follow up or seek emergency care.    I spent 40 minutes of dedicated to the care of this patient on the date of this encounter to include pre-visit review of records, face-to-face time with the patient discussing conditions above, post visit ordering of testing, clinical documentation with the electronic health record, making appropriate referrals as documented, and communicating necessary findings to members of the patients care team.   C. Chloe Counter, MD [facility]    *This note was dictated using voice recognition software/Dragon.  Despite best efforts to proofread, errors can occur which can change the meaning. Any transcriptional errors that result from this process are unintentional and may not be fully corrected at the time of dictation.

## 2023-10-06 ENCOUNTER — Telehealth: Payer: Self-pay

## 2023-10-06 NOTE — Telephone Encounter (Signed)
 Copied from CRM [id number]. Topic: Clinical - Order For Equipment >> Oct 06, 2023  2:36 PM Eveleen Hinds B wrote: Reason for CRM:  Mindy, [facility] need clinical notes from yesterdays (6/10) appt. Please fax to: [phone] Attention Mindy   [phone] ext [phone])

## 2023-10-06 NOTE — Telephone Encounter (Signed)
 Note has been faxed.  Nothing further needed.

## 2023-10-07 DIAGNOSIS — Z419 Encounter for procedure for purposes other than remedying health state, unspecified: Secondary | ICD-10-CM | POA: Diagnosis not present

## 2023-10-08 ENCOUNTER — Other Ambulatory Visit: Payer: Self-pay

## 2023-10-08 ENCOUNTER — Encounter: Payer: Self-pay | Admitting: Pulmonary Disease

## 2023-10-08 DIAGNOSIS — J449 Chronic obstructive pulmonary disease, unspecified: Secondary | ICD-10-CM

## 2023-10-08 DIAGNOSIS — E1142 Type 2 diabetes mellitus with diabetic polyneuropathy: Secondary | ICD-10-CM

## 2023-10-08 NOTE — Patient Outreach (Unsigned)
 Complex Care Management   Visit Note  10/08/2023  Name:  Cheryl Chandler MRN: [id number] DOB: August 16, 1965  Situation: Referral received for Complex Care Management related to COPD and DM. I obtained verbal consent from Patient.  Visit completed with [name] via telephone.  Background:   Past Medical History:  Diagnosis Date   Arthritis    Asthma    COPD (chronic obstructive pulmonary disease) (HCC)    Hypertension    Neuropathy    Pericarditis    Personal history of urinary calculi 11/29/2014   Thyroid  disease        Assessment: Patient Reported Symptoms: Cognitive Cognitive Status: Alert and oriented to person, place, and time, Normal speech and language skills Cognitive/Intellectual Conditions Management [RPT]: None reported or documented in medical history or problem list Health Maintenance Behaviors: Annual physical exam, Stress management, Spiritual practice(s)  Neurological Neurological Review of Symptoms: No symptoms reported Neurological Conditions:  (Neuropathy) Neurological Management Strategies: Adequate rest, Medication therapy, Routine screening Neurological Self-Management Outcome: 4 (good)  HEENT HEENT Symptoms Reported: No symptoms reported HEENT Management Strategies: Routine screening  Cardiovascular Cardiovascular Symptoms Reported: No symptoms reported Does patient have uncontrolled Hypertension?: No Cardiovascular Conditions: High blood cholesterol, Hypertension Cardiovascular Management Strategies: Diet modification, Medical device, Medication therapy, Routine screening Cardiovascular Self-Management Outcome: 4 (good)  Respiratory Respiratory Symptoms Reported: No symptoms reported Respiratory Conditions: COPD Respiratory Self-Management Outcome: 4 (good)  Endocrine Patient reports the following symptoms related to hypoglycemia or hyperglycemia : No symptoms reported Is patient diabetic?: Yes Is patient checking blood sugars at home?:  Yes Endocrine Conditions: Diabetes, Vitamin D  deficiency, Thyroid  disorder Endocrine Management Strategies: Medication therapy, Routine screening, Medical device, Diet modification, Coping strategies Endocrine Self-Management Outcome: 4 (good)  Gastrointestinal Gastrointestinal Symptoms Reported: No symptoms reported Gastrointestinal Conditions: Reflux/heartburn Gastrointestinal Management Strategies: Medication therapy, Coping strategies, Diet modification Gastrointestinal Self-Management Outcome: 4 (good) Nutrition Risk Screen (CP): No indicators present  Genitourinary Genitourinary Symptoms Reported: No symptoms reported Genitourinary Conditions: Other Other Genitourinary Conditions: History of overactive bladder Genitourinary Management Strategies: Coping strategies Genitourinary Self-Management Outcome: 4 (good)  Integumentary Integumentary Symptoms Reported: No symptoms reported Skin Conditions: Dermatitis Skin Management Strategies: Routine screening Skin Self-Management Outcome: 4 (good)  Musculoskeletal Musculoskelatal Symptoms Reviewed: Difficulty walking Additional Musculoskeletal Details: Reports using wheelchair Musculoskeletal Conditions: Back pain, Unsteady gait, Osteoarthritis Musculoskeletal Management Strategies: Coping strategies, Routine screening, Medical device Musculoskeletal Self-Management Outcome: 3 (uncertain) Musculoskeletal Comment: Reports completing home health physical therapy. Reports using wheelchair as recommended Falls in the past year?: Yes Number of falls in past year: 2 or more Was there an injury with Fall?: Yes Fall Risk Category Calculator: 3 Patient Fall Risk Level: High Fall Risk Patient at Risk for Falls Due to: History of fall(s), Impaired mobility, Medication side effect Fall risk Follow up: Falls prevention discussed  Psychosocial Psychosocial Symptoms Reported: Sadness - if selected complete PHQ 2-9 Behavioral Health Conditions:  Anxiety, Depression, Post traumatic stress Behavioral Management Strategies: Coping strategies, Medication therapy, Support system Behavioral Health Self-Management Outcome: 4 (good) Major Change/Loss/Stressor/Fears (CP): Medical condition, family, Medical condition, self, Death of a loved one Behaviors When Feeling Stressed/Fearful: Rest, Reflect of positive memories Techniques to Cope with Loss/Stress/Change: Medication, Spiritual practice(s), Diversional activities Quality of Family Relationships: supportive Do you feel physically threatened by others?: No      10/08/2023   11:51 AM  Depression screen PHQ 2/9  Decreased Interest 0  Down, Depressed, Hopeless 1  PHQ - 2 Score 1    There were no vitals filed for  this visit.  Medications Reviewed Today     Reviewed by Roxie Cord, RN (Registered Nurse) on 10/08/23 at 1152  Med List Status: <None>   Medication Order Taking? Sig Documenting Provider Last Dose Status Informant  Accu-Chek Softclix Lancets lancets [id number] No Use to check blood glucose up to three times daily Aileen Alexanders, NP Taking Active   albuterol  (PROVENTIL ) (2.5 MG/3ML) 0.083% nebulizer solution [id number] No USE 1 VIAL VIA NEBULIZER EVERY 4 HOURS AS NEEDED Aileen Alexanders, NP Taking Active   atorvastatin  (LIPITOR) 80 MG tablet [id number] No Take 1 tablet (80 mg total) by mouth daily. Mecum, Erin E, PA-C Taking Active Self, Other  Blood Glucose Monitoring Suppl (ACCU-CHEK GUIDE) w/Device KIT [id number] No Use to check blood sugar 3 times a day. Doran Galloway T, NP Taking Active Self, Other  Blood Pressure Monitoring (OMRON 3 SERIES BP MONITOR) DEVI [id number] No Use to check blood pressure as directed Aileen Alexanders, NP Taking Active Self, Other  citalopram  (CELEXA ) 40 MG tablet [id number] No TAKE 1 TABLET BY MOUTH ONCE DAILY Aileen Alexanders, NP Taking Active   diphenhydrAMINE (BENADRYL) 25 mg capsule [id number] No Take 25 mg by mouth every 6 (six) hours as  needed for itching. Taking once a day [provider] Taking Active Self, Other  doxycycline  (VIBRA -TABS) 100 MG tablet [id number] No Take 1 tablet (100 mg total) by mouth 2 (two) times daily. Aileen Alexanders, NP Taking Active   Dulaglutide  (TRULICITY ) 0.75 MG/0.5ML [place] [id number] No 0.75 mg by ultrasound guided injection route once a week. Aileen Alexanders, NP Taking Active   Fluticasone -Umeclidin-Vilant (TRELEGY ELLIPTA ) 100-62.5-25 MCG/ACT AEPB [id number]  Inhale 1 puff into the lungs daily. To resume once Breo and Incruse on hand is completed. Marc Senior, MD  Active   furosemide  (LASIX ) 40 MG tablet [id number] No Take 1 tablet (40 mg total) by mouth daily. Aileen Alexanders, NP Taking Active   gabapentin  (NEURONTIN ) 300 MG capsule [id number] No TAKE 1 CAPSULE BY MOUTH 3 TIMES DAILY ASNEEDED (NEED OFFICE VISIT FOR FURTHER REFILLS)  Patient taking differently: Take 600 mg by mouth 3 (three) times daily.   Aileen Alexanders, NP Taking Active   glucose blood (ACCU-CHEK GUIDE TEST) test strip [id number] No Check blood glucose up to three times daily Aileen Alexanders, NP Taking Active   hydrALAZINE  (APRESOLINE ) 50 MG tablet [id number] No Take 50 mg by mouth 3 (three) times daily. [provider] Taking Active   hydrOXYzine  (VISTARIL ) 25 MG capsule [id number] No Take 1 capsule (25 mg total) by mouth every 8 (eight) hours as needed. Mecum, Erin E, PA-C Taking Active Self, Other  levothyroxine  (SYNTHROID ) 50 MCG tablet [id number] No TAKE 1 TABLET BY MOUTH ONCE DAILY ON AN EMPTY STOMACH. WAIT 30 MINUTES BEFORE TAKING OTHER MEDS. Aileen Alexanders, NP Taking Active   lisinopril  (ZESTRIL ) 40 MG tablet [id number] No Take 1 tablet (40 mg total) by mouth daily. Aileen Alexanders, NP Taking Active   meloxicam  (MOBIC ) 15 MG tablet [id number] No TAKE 1 TABLET BY MOUTH ONCE DAILY AS NEEDED WITH FOOD OR MILK Aileen Alexanders, NP Taking Active Self, Other           Med Note Finley Hugh, CHERYL A   Fri  Aug 07, 2022  9:13 AM) Taking daily  methocarbamol  (ROBAXIN ) 500 MG tablet [id number] No Take 1 tablet (500 mg total) by mouth in the morning and at bedtime. Aileen Alexanders, NP Taking Active   montelukast  (SINGULAIR ) 10 MG tablet [id number] No Take 1 tablet (10 mg total) by  mouth at bedtime. Aileen Alexanders, NP Taking Active   nitroGLYCERIN  (NITROSTAT ) 0.4 MG SL tablet [id number] No DISSOLVE 1 TABLET UNDER TONGUE AT ONSET OF CHEST PAIN. REPEAT IN 5 MIN IF NOT RESOLVED, MAX 3 DOSES. 911 IF NEEDED. Aileen Alexanders, NP Taking Active   omeprazole  (PRILOSEC) 20 MG capsule [id number] No Take 1 capsule (20 mg total) by mouth daily. Aileen Alexanders, NP Taking Active   ondansetron  (ZOFRAN -ODT) 4 MG disintegrating tablet [id number] No Take 1 tablet (4 mg total) by mouth every 6 (six) hours as needed for nausea or vomiting. Aileen Alexanders, NP Taking Active   OXYGEN  [id number] No Inhale 3 L into the lungs daily. [provider] Taking Active Self, Other  Silver [employer] FOAM) 8X8 PADS [id number] No Apply to areas of ulceration on legs as directed Melodi Sprung, DO Taking Active   Vitamin D , Ergocalciferol , (DRISDOL ) 1.25 MG (50000 UNIT) CAPS capsule [id number] No TAKE 1 CAPSULE BY MOUTH EVERY 7 DAYS Aileen Alexanders, NP Taking Active   Wound Cleansers (VASHE WOUND) 0.033 % SOLN [id number] No Apply 1 Application topically daily. Use as directed to cleanse lower legs skin Melodi Sprung, DO Taking Active             Recommendation:   PCP Follow-up on November 01, 2023  Follow Up Plan:   Telephone follow up appointment with Nurse Case Manager on November 09, 2023   Roxie Cord [facility] RN Care Manager Direct Dial: [phone]  Fax: [phone] Website: [email or web address]

## 2023-10-08 NOTE — Patient Instructions (Signed)
 Visit Information  Cheryl Chandler was given information about Medicaid Managed Care team care coordination services as a part of their [facility] Medicaid benefit. Krista Dejane Scheibe {MMPTINSTRXCONSENTCHOICES:24940} to engagement with the [facility] Managed Care team.   If you are experiencing a medical emergency, please call 911 or report to your local emergency department or urgent care.   If you have a non-emergency medical problem during routine business hours, please contact your provider's office and ask to speak with a nurse.   For questions related to your [facility] health plan, please call: [phone] or go here:[email or web address]  If you would like to schedule transportation through your [facility] plan, please call the following number at least 2 days in advance of your appointment: [phone].   You can also use the MTM portal or MTM mobile app to manage your rides. Reimbursement for transportation is available through [facility]! For the portal, please go to [email or web address]  Call the [facility] Crisis Line at [phone], at any time, 24 hours a day, 7 days a week. If you are in danger or need immediate medical attention call 911.  If you would like help to quit smoking, call 1-800-QUIT-NOW ((912)293-5134) OR Espaol: 1-855-Djelo-Ya (0-272-536-6440) o para ms informacin haga clic aqu or Text READY to 347-425 to register via text  Cheryl Chandler - following are the goals we discussed in your visit today:   Goals Addressed   None     Please see education materials related to *** provided {MMEDCHOICES:24216}  {CCM PT INSTRUCTIONS:22237}  {MM FOLLOW UP ZDGL:87564}  SIG***  Following is a copy of your plan of care:  There are no care plans that you recently modified to display for this patient.  Visit Information  Cheryl Chandler was given information about Medicaid Managed Care team care coordination services as a part of their [facility]  Medicaid benefit. Brylei Adeline Petitfrere {MMPTINSTRXCONSENTCHOICES:24940} to engagement with the [facility] Managed Care team.   If you are experiencing a medical emergency, please call 911 or report to your local emergency department or urgent care.   If you have a non-emergency medical problem during routine business hours, please contact your provider's office and ask to speak with a nurse.   For questions related to your [facility] health plan, please call: [phone] or go here:[email or web address]  If you would like to schedule transportation through your [employer] plan, please call the following number at least 2 days in advance of your appointment: [phone].   You can also use the MTM portal or MTM mobile app to manage your rides. Reimbursement for transportation is available through [facility]! For the portal, please go to [email or web address]  Call the [facility] Crisis Line at [phone], at any time, 24 hours a day, 7 days a week. If you are in danger or need immediate medical attention call 911.  If you would like help to quit smoking, call 1-800-QUIT-NOW (816-693-7973) OR Espaol: 1-855-Djelo-Ya (6-283-151-7616) o para ms informacin haga clic aqu or Text READY to 073-710 to register via text  Cheryl Chandler - following are the goals we discussed in your visit today:   Goals Addressed   None     Please see education materials related to *** provided {MMEDCHOICES:24216}  {CCM PT INSTRUCTIONS:22237}  {MM FOLLOW UP GYIR:48546}  SIG***  Following is a copy of your plan of care:  There are no care plans that you recently modified to display for this patient.

## 2023-10-21 ENCOUNTER — Encounter: Payer: Self-pay | Admitting: Pulmonary Disease

## 2023-10-21 ENCOUNTER — Telehealth: Payer: Self-pay

## 2023-10-21 NOTE — Telephone Encounter (Signed)
 ONO reviewed by Dr.Gonzalez - Low SpO2 84%. Patient continues to qualify for supplemental O2.  I have notified the patient and sent the ONO report to [employer].  Nothing further needed.

## 2023-10-22 DIAGNOSIS — N3941 Urge incontinence: Secondary | ICD-10-CM | POA: Diagnosis not present

## 2023-10-28 ENCOUNTER — Telehealth: Payer: Self-pay

## 2023-10-28 NOTE — Progress Notes (Signed)
   Telephone encounter was:  Unsuccessful.  10/28/2023 Name: Cheryl Chandler First MRN: [id number] DOB: 19-Aug-1965  Unsuccessful outbound call made today to assist with:  Transportation Needs   Outreach Attempt:  1st Attempt  No answer and unable to leave a message    Jon Colt [facility]  [facility] Guide, Phone: [phone] Fax: [phone] Website: [email or web address]

## 2023-11-01 ENCOUNTER — Ambulatory Visit: Admitting: Nurse Practitioner

## 2023-11-01 ENCOUNTER — Telehealth: Payer: Self-pay

## 2023-11-01 NOTE — Progress Notes (Signed)
   Telephone encounter was:  Unsuccessful.  11/01/2023 Name: Rozelia Catapano MRN: [id number] DOB: 1965-12-26  Unsuccessful outbound call made today to assist with:  Transportation Needs , Food Insecurity, and Utilities  Outreach Attempt:  2nd Attempt  A HIPAA compliant voice message was left requesting a return call.  Instructed patient to call back   Jon Colt [facility] Guide, Phone: [phone] Fax: [phone] Website: [email or web address]

## 2023-11-03 ENCOUNTER — Telehealth: Payer: Self-pay

## 2023-11-03 NOTE — Progress Notes (Signed)
   Telephone encounter was:  Unsuccessful.  11/03/2023 Name: Cheryl Chandler MRN: [id number] DOB: Feb 17, 1966  Unsuccessful outbound call made today to assist with:  Transportation Needs , Food Insecurity, and Financial Difficulties related to financial strain  Outreach Attempt:  3rd Attempt.  Referral closed unable to contact patient.  A HIPAA compliant voice message was left requesting a return call.  Instructed patient to call back    Jon Colt [facility] Guide, Phone: [phone] Fax: [phone] Website: [email or web address]

## 2023-11-06 DIAGNOSIS — Z419 Encounter for procedure for purposes other than remedying health state, unspecified: Secondary | ICD-10-CM | POA: Diagnosis not present

## 2023-11-09 ENCOUNTER — Telehealth: Payer: Self-pay

## 2023-11-09 NOTE — Patient Instructions (Signed)
 Cheryl Chandler - I am sorry I was unable to reach you today for our scheduled appointment. I work with [name], NP and am calling to support your healthcare needs. Please contact me at [phone] at your earliest convenience. I look forward to speaking with you soon.   Thank you,   Jackson Acron [facility] RN Care Manager Direct Dial: [phone]  Fax: [phone] Website: [email or web address]

## 2023-11-18 ENCOUNTER — Ambulatory Visit (INDEPENDENT_AMBULATORY_CARE_PROVIDER_SITE_OTHER): Admitting: Vascular Surgery

## 2023-11-22 DIAGNOSIS — N3941 Urge incontinence: Secondary | ICD-10-CM | POA: Diagnosis not present

## 2023-11-24 DIAGNOSIS — J449 Chronic obstructive pulmonary disease, unspecified: Secondary | ICD-10-CM | POA: Diagnosis not present

## 2023-11-27 ENCOUNTER — Other Ambulatory Visit: Payer: Self-pay | Admitting: Nurse Practitioner

## 2023-11-27 ENCOUNTER — Other Ambulatory Visit: Payer: Self-pay | Admitting: Physician Assistant

## 2023-11-29 NOTE — Telephone Encounter (Signed)
 Requested medications are due for refill today.  Unsure  Requested medications are on the active medications list.  yes  Last refill. 06/15/2023  Future visit scheduled.   yes  Notes to clinic.  Vit D must be reviewed by provider at this dosage. Unsure how often pt uses nitroglycerin  0.4. please review for refill.    Requested Prescriptions  Pending Prescriptions Disp Refills   Vitamin D , Ergocalciferol , (DRISDOL ) 1.25 MG (50000 UNIT) CAPS capsule [Pharmacy Med Name: VITAMIN D  (ERGOCALCIFEROL ) 1.25 MG] 4 capsule 3    Sig: TAKE 1 CAPSULE BY MOUTH EVERY 7 DAYS     Endocrinology:  Vitamins - Vitamin D  Supplementation 2 Failed - 11/29/2023  5:04 PM      Failed - Manual Review: Route requests for 50,000 IU strength to the provider      Failed - Ca in normal range and within 360 days    Calcium   Date Value Ref Range Status  05/03/2023 7.9 (L) 8.9 - 10.3 mg/dL Final   Calcium , Total  Date Value Ref Range Status  08/05/2014 8.2 (L) mg/dL Final    Comment:    1.0-89.6 NOTE: New Reference Range  07/03/14          Failed - Vitamin D  in normal range and within 360 days    Vit D, 25-Hydroxy  Date Value Ref Range Status  08/03/2022 30.6 30.0 - 100.0 ng/mL Final    Comment:    Vitamin D  deficiency has been defined by the Institute of Medicine and an Endocrine Society practice guideline as a level of serum 25-OH vitamin D  less than 20 ng/mL (1,2). The Endocrine Society went on to further define vitamin D  insufficiency as a level between 21 and 29 ng/mL (2). 1. IOM (Institute of Medicine). 2010. Dietary reference    intakes for calcium  and D. [place]: The    [employer]. 2. Holick MF, Binkley [place], Bischoff-Ferrari HA, et al.    Evaluation, treatment, and prevention of vitamin D     deficiency: an Endocrine Society clinical practice    guideline. JCEM. 2011 Jul; 96(7):1911-30.          Passed - Valid encounter within last 12 months    Recent Outpatient Visits            5 months ago Type 2 diabetes mellitus with foot ulcer, without long-term current use of insulin  [facility])   [facility] [name], NP   5 months ago Chronic obstructive pulmonary disease, unspecified COPD type [facility])   [facility] [name], NP       Future Appointments             In 2 months Jackquline Sawyer, MD [facility]             nitroGLYCERIN  (NITROSTAT ) 0.4 MG SL tablet [Pharmacy Med Name: NITROGLYCERIN  0.4 MG SL TAB] 25 tablet 1    Sig: DISSOLVE 1 TABLET UNDER THE TONGUE EVERY5 MINS AS NEEDED FOR CHEST PAIN. MAY TAKE UP TO 3 DOSES     Cardiovascular:  Nitrates Passed - 11/29/2023  5:04 PM      Passed - Last BP in normal range    BP Readings from Last 1 Encounters:  10/05/23 136/60         Passed - Last Heart Rate in normal range    Pulse Readings from Last 1 Encounters:  10/05/23 77  Passed - Valid encounter within last 12 months    Recent Outpatient Visits           5 months ago Type 2 diabetes mellitus with foot ulcer, without long-term current use of insulin  [facility])   [facility] [name], NP   5 months ago Chronic obstructive pulmonary disease, unspecified COPD type [facility])    [facility] [name], NP       Future Appointments             In 2 months Jackquline Sawyer, MD [facility]

## 2023-11-29 NOTE — Telephone Encounter (Signed)
 Requested medications are due for refill today.  yes  Requested medications are on the active medications list.  yes  Last refill. Atorvastatin  11/09/2022, hydralazine  04/30/2023  Future visit scheduled.   yes  Notes to clinic.  Labs are expired. Hydralazine  is historical.    Requested Prescriptions  Pending Prescriptions Disp Refills   atorvastatin  (LIPITOR) 80 MG tablet [Pharmacy Med Name: ATORVASTATIN  CALCIUM  80 MG TAB] 90 tablet 3    Sig: TAKE 1 TABLET BY MOUTH ONCE EVERY EVENING     Cardiovascular:  Antilipid - Statins Failed - 11/29/2023  5:02 PM      Failed - Lipid Panel in normal range within the last 12 months    Cholesterol, Total  Date Value Ref Range Status  08/03/2022 110 100 - 199 mg/dL Final   LDL Chol Calc (NIH)  Date Value Ref Range Status  08/03/2022 55 0 - 99 mg/dL Final   HDL  Date Value Ref Range Status  08/03/2022 39 (L) >39 mg/dL Final   Triglycerides  Date Value Ref Range Status  08/03/2022 77 0 - 149 mg/dL Final         Passed - Patient is not pregnant      Passed - Valid encounter within last 12 months    Recent Outpatient Visits           5 months ago Type 2 diabetes mellitus with foot ulcer, without long-term current use of insulin  (HCC)   [facility] [name], NP   5 months ago Chronic obstructive pulmonary disease, unspecified COPD type (HCC)   [facility] [name], NP       Future Appointments             In 2 months Jackquline Sawyer, MD [facility]             hydrALAZINE  (APRESOLINE ) 50 MG tablet [Pharmacy Med Name: HYDRALAZINE  HCL 50 MG TAB] 270 tablet     Sig: TAKE 1 TABLET BY MOUTH 3 TIMES DAILY     Cardiovascular:  Vasodilators Failed - 11/29/2023  5:02 PM      Failed - HCT in normal range and within 360 days    HCT  Date Value Ref Range Status  05/03/2023 31.1 (L) 36.0 - 46.0 % Final   Hematocrit  Date Value Ref Range Status   06/04/2021 38.5 34.0 - 46.6 % Final         Failed - HGB in normal range and within 360 days    Hemoglobin  Date Value Ref Range Status  05/03/2023 10.1 (L) 12.0 - 15.0 g/dL Final  97/91/7976 86.8 11.1 - 15.9 g/dL Final         Failed - RBC in normal range and within 360 days    RBC  Date Value Ref Range Status  05/03/2023 3.53 (L) 3.87 - 5.11 MIL/uL Final         Failed - ANA Screen, Ifa, Serum in normal range and within 360 days    No results found for: ANA, ANATITER, LABANTI       Passed - WBC in normal range and within 360 days    WBC  Date Value Ref Range Status  05/03/2023 5.5 4.0 - 10.5 K/uL Final         Passed - PLT in normal range and within 360 days    Platelets  Date Value Ref Range Status  05/03/2023 286 150 - 400 K/uL  Final  06/04/2021 355 150 - 450 x10E3/uL Final         Passed - Last BP in normal range    BP Readings from Last 1 Encounters:  10/05/23 136/60         Passed - Valid encounter within last 12 months    Recent Outpatient Visits           5 months ago Type 2 diabetes mellitus with foot ulcer, without long-term current use of insulin  [facility])   [facility] [name], NP   5 months ago Chronic obstructive pulmonary disease, unspecified COPD type [facility])   [facility] [name], NP       Future Appointments             In 2 months Jackquline Sawyer, MD [facility]

## 2023-12-07 ENCOUNTER — Telehealth: Payer: Self-pay

## 2023-12-07 DIAGNOSIS — Z419 Encounter for procedure for purposes other than remedying health state, unspecified: Secondary | ICD-10-CM | POA: Diagnosis not present

## 2023-12-20 ENCOUNTER — Telehealth: Payer: Self-pay | Admitting: Pulmonary Disease

## 2023-12-20 ENCOUNTER — Ambulatory Visit: Admission: RE | Admit: 2023-12-20 | Source: Ambulatory Visit

## 2023-12-20 NOTE — Telephone Encounter (Signed)
 Patient is scheduled to do echo 12/20/23 and we have received a note from insurance. The initial review has been completed by our Physician Reviewer. The clinical inoformation received does not support the requirements for medical necessity quidelines for the procedure.  We are asking for a peer to peer discussion

## 2023-12-23 DIAGNOSIS — N3941 Urge incontinence: Secondary | ICD-10-CM | POA: Diagnosis not present

## 2023-12-23 NOTE — Telephone Encounter (Signed)
 Patient has appt on 02/07/24

## 2023-12-25 DIAGNOSIS — J449 Chronic obstructive pulmonary disease, unspecified: Secondary | ICD-10-CM | POA: Diagnosis not present

## 2024-01-03 ENCOUNTER — Ambulatory Visit: Admitting: Nurse Practitioner

## 2024-01-03 NOTE — Telephone Encounter (Unsigned)
 Copied from CRM [id number]. Topic: Clinical - Request for Lab/Test Order >> Jan 03, 2024  3:41 PM Cheryl Chandler wrote: Reason for CRM: Reason for CRM: Patient states she was unable to complete her echocardiogram due to insurance not covering.    Callback number: [phone]

## 2024-01-03 NOTE — Telephone Encounter (Signed)
 LMTCB

## 2024-01-03 NOTE — Telephone Encounter (Unsigned)
 Copied from CRM [id number]. Topic: Clinical - Medical Advice >> Jan 03, 2024  3:55 PM Joesph PARAS wrote: Reason for CRM: Patient calling back nurse regarding missed echocardiogram. Advised that, per chart note, provider will re-evaluate at next follow-up.

## 2024-01-03 NOTE — Telephone Encounter (Signed)
 We will have to reevaluate her on follow-up.

## 2024-01-04 NOTE — Telephone Encounter (Signed)
 Noted. Nothing further needed.

## 2024-01-07 DIAGNOSIS — Z419 Encounter for procedure for purposes other than remedying health state, unspecified: Secondary | ICD-10-CM | POA: Diagnosis not present

## 2024-01-08 ENCOUNTER — Other Ambulatory Visit: Payer: Self-pay | Admitting: Nurse Practitioner

## 2024-01-08 DIAGNOSIS — J301 Allergic rhinitis due to pollen: Secondary | ICD-10-CM

## 2024-01-09 NOTE — Progress Notes (Signed)
 MRN : [id number]  Cheryl Chandler is a 58 y.o. (24-Aug-1965) female who presents with chief complaint of legs swell.  History of Present Illness:   The patient returns to the office for followup evaluation regarding leg swelling.  The swelling has persisted and the pain associated with swelling continues. There have not been any interval development of a ulcerations or wounds.  She did not tolerate Unna wraps. She does have a lymphedema pump which she has been trying to use.  The patient also states elevation during the day and exercise is being done too.  However, she does struggle she does have a hospital bed but frequently sleeps in a chair.  No outpatient medications have been marked as taking for the 01/10/24 encounter (Appointment) with Jama, Cordella MATSU, MD.    Past Medical History:  Diagnosis Date   Arthritis    Asthma    COPD (chronic obstructive pulmonary disease) (HCC)    Hypertension    Neuropathy    Pericarditis    Personal history of urinary calculi 11/29/2014   Thyroid  disease     Past Surgical History:  Procedure Laterality Date   APPENDECTOMY     arm surgery Left    pins   fatty tumor removed from back     JOINT REPLACEMENT Right    LEG SURGERY Bilateral    OVARIAN CYST SURGERY Right    took piece of right ovary due to cyst   TOTAL HIP ARTHROPLASTY Right    wisdom teeth removed      Social History Social History   Tobacco Use   Smoking status: Every Day    Current packs/day: 0.50    Average packs/day: 2.9 packs/day for 31.1 years (90.5 ttl pk-yrs)    Types: Cigarettes    Start date: 01/1992    Last attempt to quit: 01/2022   Smokeless tobacco: Never   Tobacco comments:    Smokes 1 PPD; 12/18/2022.  Vaping Use   Vaping status: Never Used  Substance Use Topics   Alcohol use: No    Alcohol/week: 0.0 standard drinks of alcohol   Drug use: No    Family History Family History  Problem Relation Age of Onset    Hyperlipidemia Mother    Hypertension Mother    Heart disease Mother    Seizures Sister        disorders    Allergies  Allergen Reactions   Codeine Shortness Of Breath and Rash   Percocet [Oxycodone-Acetaminophen ] Shortness Of Breath   Sulfa Antibiotics Shortness Of Breath and Rash   Aspirin Hives   Bee Venom     Bee stings   Darvon [Propoxyphene]     Darvocet-N 100   Latex    Oxycodone Nausea And Vomiting   Penicillins     Has patient had a PCN reaction causing immediate rash, facial/tongue/throat swelling, SOB or lightheadedness with hypotension: Unknown Has patient had a PCN reaction causing severe rash involving mucus membranes or skin necrosis: Unknown Has patient had a PCN reaction that required hospitalization: Unknown Has patient had a PCN reaction occurring within the last 10 years: Unknown If all of the above answers are NO, then may proceed with Cephalosporin use.     REVIEW OF SYSTEMS (Negative unless checked)  Constitutional: [] Weight loss  [] Fever  [] Chills Cardiac: [] Chest pain   []   Chest pressure   [] Palpitations   [] Shortness of breath when laying flat   [] Shortness of breath with exertion. Vascular:  [] Pain in legs with walking   [x] Pain in legs with standing  [] History of DVT   [] Phlebitis   [x] Swelling in legs   [] Varicose veins   [] Non-healing ulcers Pulmonary:   [] Uses home oxygen    [] Productive cough   [] Hemoptysis   [] Wheeze  [] COPD   [] Asthma Neurologic:  [] Dizziness   [] Seizures   [] History of stroke   [] History of TIA  [] Aphasia   [] Vissual changes   [] Weakness or numbness in arm   [] Weakness or numbness in leg Musculoskeletal:   [] Joint swelling   [] Joint pain   [] Low back pain Hematologic:  [] Easy bruising  [] Easy bleeding   [] Hypercoagulable state   [] Anemic Gastrointestinal:  [] Diarrhea   [] Vomiting  [] Gastroesophageal reflux/heartburn   [] Difficulty swallowing. Genitourinary:  [] Chronic kidney disease   [] Difficult urination  [] Frequent  urination   [] Blood in urine Skin:  [] Rashes   [] Ulcers  Psychological:  [] History of anxiety   []  History of major depression.  Physical Examination  There were no vitals filed for this visit. There is no height or weight on file to calculate BMI. Gen: WD/WN, NAD Head: [place]/AT, No temporalis wasting.  Ear/Nose/Throat: Hearing grossly intact, nares w/o erythema or drainage, pinna without lesions Eyes: PER, EOMI, sclera nonicteric.  Neck: Supple, no gross masses.  No JVD.  Pulmonary:  Good air movement, no audible wheezing, no use of accessory muscles.  Cardiac: RRR, precordium not hyperdynamic. Vascular:  scattered varicosities present bilaterally.  Mild venous stasis changes to the legs bilaterally.  3-4+ soft pitting edema, CEAP [id number]  Vessel Right Left  Radial Palpable Palpable  Gastrointestinal: soft, non-distended. No guarding/no peritoneal signs.  Musculoskeletal: M/S 5/5 throughout.  No deformity.  Neurologic: CN 2-12 intact. Pain and light touch intact in extremities.  Symmetrical.  Speech is fluent. Motor exam as listed above. Psychiatric: Judgment intact, Mood & affect appropriate for pt's clinical situation. Dermatologic: Venous rashes no ulcers noted.  No changes consistent with cellulitis. Lymph : No lichenification or skin changes of chronic lymphedema.  CBC Lab Results  Component Value Date   WBC 5.5 05/03/2023   HGB 10.1 (L) 05/03/2023   HCT 31.1 (L) 05/03/2023   MCV 88.1 05/03/2023   PLT 286 05/03/2023    BMET    Component Value Date/Time   NA 136 05/03/2023 0405   NA 140 08/03/2022 1405   NA 135 08/05/2014 1306   K 3.6 05/03/2023 0405   K 4.5 08/05/2014 1306   CL 104 05/03/2023 0405   CL 102 08/05/2014 1306   CO2 25 05/03/2023 0405   CO2 25 08/05/2014 1306   GLUCOSE 98 05/03/2023 0405   GLUCOSE 136 (H) 08/05/2014 1306   BUN 13 05/03/2023 0405   BUN 18 08/03/2022 1405   BUN 23 (H) 08/05/2014 1306   CREATININE 0.88 05/03/2023 0405   CREATININE  0.86 08/05/2014 1306   CALCIUM  7.9 (L) 05/03/2023 0405   CALCIUM  8.2 (L) 08/05/2014 1306   GFRNONAA >60 05/03/2023 0405   GFRNONAA >60 08/05/2014 1306   GFRAA 92 04/03/2020 1637   GFRAA >60 08/05/2014 1306   CrCl cannot be calculated (Patient's most recent lab result is older than the maximum 21 days allowed.).  COAG No results found for: INR, PROTIME  Radiology No results found.   Assessment/Plan 1. Lymphedema (Primary) Recommend:  No surgery or intervention at this point in time.  I have reviewed my discussion with the patient regarding lymphedema and why it  causes symptoms.  Patient will continue wearing graduated compression on a daily basis. The patient should put the compression on first thing in the morning and removing them in the evening. The patient should not sleep in the compression.   In addition, behavioral modification throughout the day will be continued.  This will include frequent elevation (such as in a recliner), use of over the counter pain medications as needed and exercise such as walking.  The systemic causes for chronic edema such as liver, kidney and cardiac etiologies does not appear to have significant changed over the past year.    The patient will continue aggressive use of the  lymph pump.  This will continue to improve the edema control and prevent sequela such as ulcers and infections.   The patient will follow-up with me on an annual basis.   2. Chronic venous insufficiency Recommend:  No surgery or intervention at this point in time.    I have reviewed my discussion with the patient regarding lymphedema and why it  causes symptoms.  Patient will continue wearing graduated compression on a daily basis. The patient should put the compression on first thing in the morning and removing them in the evening. The patient should not sleep in the compression.   In addition, behavioral modification throughout the day will be continued.  This  will include frequent elevation (such as in a recliner), use of over the counter pain medications as needed and exercise such as walking.  The systemic causes for chronic edema such as liver, kidney and cardiac etiologies does not appear to have significant changed over the past year.    The patient will continue aggressive use of the  lymph pump.  This will continue to improve the edema control and prevent sequela such as ulcers and infections.   The patient will follow-up with me on an annual basis.   3. Essential hypertension Continue antihypertensive medications as already ordered, these medications have been reviewed and there are no changes at this time.  4. Chronic obstructive pulmonary disease, unspecified COPD type (HCC) Continue pulmonary medications and aerosols as already ordered, these medications have been reviewed and there are no changes at this time.   5. Controlled type 2 diabetes mellitus with diabetic polyneuropathy, without long-term current use of insulin  (HCC) Continue hypoglycemic medications as already ordered, these medications have been reviewed and there are no changes at this time.  Hgb A1C to be monitored as already arranged by primary service    Cordella Shawl, MD  01/09/2024 4:13 PM

## 2024-01-10 ENCOUNTER — Encounter (INDEPENDENT_AMBULATORY_CARE_PROVIDER_SITE_OTHER): Payer: Self-pay | Admitting: Vascular Surgery

## 2024-01-10 ENCOUNTER — Ambulatory Visit (INDEPENDENT_AMBULATORY_CARE_PROVIDER_SITE_OTHER): Admitting: Vascular Surgery

## 2024-01-10 DIAGNOSIS — I890 Lymphedema, not elsewhere classified: Secondary | ICD-10-CM

## 2024-01-10 DIAGNOSIS — I872 Venous insufficiency (chronic) (peripheral): Secondary | ICD-10-CM

## 2024-01-10 DIAGNOSIS — E1142 Type 2 diabetes mellitus with diabetic polyneuropathy: Secondary | ICD-10-CM

## 2024-01-10 DIAGNOSIS — I10 Essential (primary) hypertension: Secondary | ICD-10-CM

## 2024-01-10 DIAGNOSIS — J449 Chronic obstructive pulmonary disease, unspecified: Secondary | ICD-10-CM

## 2024-01-11 NOTE — Telephone Encounter (Signed)
 Requested Prescriptions  Pending Prescriptions Disp Refills   TRULICITY  0.75 MG/0.5ML SOAJ [Pharmacy Med Name: TRULICITY  0.75 MG/0.5ML SUBQ SOLN M] 2 mL 3    Sig: INJECT 0.75MG  SUBCUTANEOUSLY ONCE A WEEKAS DIRECTED     Endocrinology:  Diabetes - GLP-1 Receptor Agonists Failed - 01/11/2024  8:10 AM      Failed - HBA1C is between 0 and 7.9 and within 180 days    HB A1C (BAYER DCA - WAIVED)  Date Value Ref Range Status  11/09/2022 5.9 (H) 4.8 - 5.6 % Final    Comment:             Prediabetes: 5.7 - 6.4          Diabetes: >6.4          Glycemic control for adults with diabetes: <7.0    Hgb A1c MFr Bld  Date Value Ref Range Status  05/02/2023 5.8 (H) 4.8 - 5.6 % Final    Comment:    (NOTE)         Prediabetes: 5.7 - 6.4         Diabetes: >6.4         Glycemic control for adults with diabetes: <7.0          Failed - Valid encounter within last 6 months    Recent Outpatient Visits           6 months ago Type 2 diabetes mellitus with foot ulcer, without long-term current use of insulin  (HCC)   [place] [facility] [name], NP   7 months ago Chronic obstructive pulmonary disease, unspecified COPD type [facility])   [place] [facility] [name], NP       Future Appointments             In 1 month Jackquline Sawyer, MD [facility]             omeprazole  (PRILOSEC) 20 MG capsule [Pharmacy Med Name: OMEPRAZOLE  DR 20 MG CAP] 90 capsule 0    Sig: TAKE 1 CAPSULE BY MOUTH ONCE DAILY     Gastroenterology: Proton Pump Inhibitors Passed - 01/11/2024  8:10 AM      Passed - Valid encounter within last 12 months    Recent Outpatient Visits           6 months ago Type 2 diabetes mellitus with foot ulcer, without long-term current use of insulin  (HCC)   [place] [facility] [place], Pao, NP   7 months ago Chronic obstructive pulmonary disease, unspecified COPD type [facility])   [place] [facility] [name], NP       Future Appointments             In 1 month Jackquline Sawyer, MD [place] [facility]             montelukast  (SINGULAIR ) 10 MG tablet [Pharmacy Med Name: MONTELUKAST  SODIUM 10 MG TAB] 90 tablet 1    Sig: TAKE 1 TABLET BY MOUTH AT BEDTIME     Pulmonology:  Leukotriene Inhibitors Passed - 01/11/2024  8:10 AM      Passed - Valid encounter within last 12 months    Recent Outpatient Visits           6 months ago Type 2 diabetes mellitus with foot ulcer, without long-term current use of insulin  [facility]   [place] [facility] [name], NP   7 months ago  Chronic obstructive pulmonary disease, unspecified COPD type (HCC)   [place] [facility] [name], NP       Future Appointments             In 1 month Jackquline Sawyer, MD [place] [facility]             lisinopril  (ZESTRIL ) 40 MG tablet [Pharmacy Med Name: LISINOPRIL  40 MG TAB] 90 tablet 1    Sig: TAKE 1 TABLET BY MOUTH ONCE DAILY     Cardiovascular:  ACE Inhibitors Failed - 01/11/2024  8:10 AM      Failed - Cr in normal range and within 180 days    Creatinine  Date Value Ref Range Status  08/05/2014 0.86 mg/dL Final    Comment:    9.55-8.99 NOTE: New Reference Range  07/03/14    Creatinine, Ser  Date Value Ref Range Status  05/03/2023 0.88 0.44 - 1.00 mg/dL Final         Failed - K in normal range and within 180 days    Potassium  Date Value Ref Range Status  05/03/2023 3.6 3.5 - 5.1 mmol/L Final  08/05/2014 4.5 mmol/L Final    Comment:    3.5-5.1 NOTE: New Reference Range  07/03/14          Failed - Valid encounter within last 6 months    Recent Outpatient Visits           6 months ago Type 2 diabetes mellitus with foot ulcer, without long-term current use of insulin  (HCC)   [place] [facility] [name], NP   7 months ago Chronic obstructive pulmonary disease,  unspecified COPD type (HCC)   [place] [facility] [name], NP       Future Appointments             In 1 month Jackquline Sawyer, MD [place] [facility]            Passed - Patient is not pregnant      Passed - Last BP in normal range    BP Readings from Last 1 Encounters:  10/05/23 136/60          furosemide  (LASIX ) 40 MG tablet [Pharmacy Med Name: FUROSEMIDE  40 MG TAB] 90 tablet 1    Sig: TAKE 1 TABLET BY MOUTH ONCE DAILY     Cardiovascular:  Diuretics - Loop Failed - 01/11/2024  8:10 AM      Failed - K in normal range and within 180 days    Potassium  Date Value Ref Range Status  05/03/2023 3.6 3.5 - 5.1 mmol/L Final  08/05/2014 4.5 mmol/L Final    Comment:    3.5-5.1 NOTE: New Reference Range  07/03/14          Failed - Ca in normal range and within 180 days    Calcium   Date Value Ref Range Status  05/03/2023 7.9 (L) 8.9 - 10.3 mg/dL Final   Calcium , Total  Date Value Ref Range Status  08/05/2014 8.2 (L) mg/dL Final    Comment:    1.0-89.6 NOTE: New Reference Range  07/03/14          Failed - Na in normal range and within 180 days    Sodium  Date Value Ref Range Status  05/03/2023 136 135 - 145 mmol/L Final  08/03/2022 140 134 - 144 mmol/L Final  08/05/2014 135 mmol/L Final    Comment:  135-145 NOTE: New Reference Range  07/03/14          Failed - Cr in normal range and within 180 days    Creatinine  Date Value Ref Range Status  08/05/2014 0.86 mg/dL Final    Comment:    9.55-8.99 NOTE: New Reference Range  07/03/14    Creatinine, Ser  Date Value Ref Range Status  05/03/2023 0.88 0.44 - 1.00 mg/dL Final         Failed - Cl in normal range and within 180 days    Chloride  Date Value Ref Range Status  05/03/2023 104 98 - 111 mmol/L Final  08/05/2014 102 mmol/L Final    Comment:    101-111 NOTE: New Reference Range  07/03/14          Failed - Mg Level in normal range and within 180 days     Magnesium  Date Value Ref Range Status  05/02/2023 2.0 1.7 - 2.4 mg/dL Final    Comment:    Performed at [facility], [address] [place], [place] [address]         Failed - Valid encounter within last 6 months    Recent Outpatient Visits           6 months ago Type 2 diabetes mellitus with foot ulcer, without long-term current use of insulin  [facility])   [place] [facility] [name], NP   7 months ago Chronic obstructive pulmonary disease, unspecified COPD type (HCC)   [place] [facility] [name], NP       Future Appointments             In 1 month Jackquline Sawyer, MD [place] [facility]            Passed - Last BP in normal range    BP Readings from Last 1 Encounters:  10/05/23 136/60

## 2024-01-12 ENCOUNTER — Telehealth: Payer: Self-pay

## 2024-01-12 ENCOUNTER — Telehealth (INDEPENDENT_AMBULATORY_CARE_PROVIDER_SITE_OTHER): Payer: Self-pay

## 2024-01-12 ENCOUNTER — Other Ambulatory Visit (INDEPENDENT_AMBULATORY_CARE_PROVIDER_SITE_OTHER): Payer: Self-pay | Admitting: Nurse Practitioner

## 2024-01-12 ENCOUNTER — Other Ambulatory Visit: Payer: Self-pay | Admitting: Nurse Practitioner

## 2024-01-12 NOTE — Telephone Encounter (Signed)
 Patient left a message stating that she requesting for something for pain. Patient states that her legs hurt all the time but the last night the pain was very severe and she was not able to sleep. Patient PCP has prescribed methocarbamol  and gabapentin  previously. I recommended to the patient that she should contact her PCP for further evaluation. Please Advise

## 2024-01-12 NOTE — Telephone Encounter (Unsigned)
 Copied from CRM [id number]. Topic: Clinical - Medication Refill >> Jan 12, 2024 11:37 AM Carlatta H wrote: Medication: methocarbamol  (ROBAXIN ) 500 MG tablet  Has the patient contacted their pharmacy? No (Agent: If no, request that the patient contact the pharmacy for the refill. If patient does not wish to contact the pharmacy document the reason why and proceed with request.) (Agent: If yes, when and what did the pharmacy advise?)  This is the patient's preferred pharmacy:  [facility] - [place], [place] - [address] [place] [address] Phone: [phone] Fax: [phone]  Is this the correct pharmacy for this prescription? Yes If no, delete pharmacy and type the correct one.   Has the prescription been filled recently? No  Is the patient out of the medication? Yes  Has the patient been seen for an appointment in the last year OR does the patient have an upcoming appointment? Yes  Can we respond through MyChart? No  Agent: Please be advised that Rx refills may take up to 3 business days. We ask that you follow-up with your pharmacy.

## 2024-01-12 NOTE — Telephone Encounter (Signed)
 We typically don't prescribe pain medication for those with lymphedema. Her pain may be more related to her neuropathy.  She should discuss with PCP

## 2024-01-12 NOTE — Telephone Encounter (Signed)
 Patient has been notified with medical recommendations and verbalized understanding

## 2024-01-13 NOTE — Telephone Encounter (Signed)
 Requested medications are due for refill today.  unsure  Requested medications are on the active medications list.  yes  Last refill. 06/08/2023 #60 1 rf  Future visit scheduled.   yes  Notes to clinic.  Refill not delegated. Per med sheet pt no longer taking as of 01/10/2024    Requested Prescriptions  Pending Prescriptions Disp Refills   methocarbamol  (ROBAXIN ) 500 MG tablet 60 tablet 1    Sig: Take 1 tablet (500 mg total) by mouth in the morning and at bedtime.     Not Delegated - Analgesics:  Muscle Relaxants Failed - 01/13/2024  1:58 PM      Failed - This refill cannot be delegated      Failed - Valid encounter within last 6 months    Recent Outpatient Visits           6 months ago Type 2 diabetes mellitus with foot ulcer, without long-term current use of insulin  [facility])   [place] [name], NP   7 months ago Chronic obstructive pulmonary disease, unspecified COPD type [facility])   [place] [name], NP       Future Appointments             In 1 month Jackquline Sawyer, MD [facility]

## 2024-01-24 DIAGNOSIS — N3941 Urge incontinence: Secondary | ICD-10-CM | POA: Diagnosis not present

## 2024-01-25 DIAGNOSIS — J449 Chronic obstructive pulmonary disease, unspecified: Secondary | ICD-10-CM | POA: Diagnosis not present

## 2024-02-07 ENCOUNTER — Ambulatory Visit: Admitting: Pulmonary Disease

## 2024-02-14 ENCOUNTER — Telehealth: Payer: Self-pay

## 2024-02-15 ENCOUNTER — Ambulatory Visit: Payer: Medicaid Other | Admitting: Dermatology

## 2024-02-15 ENCOUNTER — Telehealth: Payer: Self-pay

## 2024-02-17 NOTE — Patient Outreach (Signed)
  Care Management   Outreach Note   Name: Cheryl Chandler MRN: [id number] DOB: August 01, 1965  Message received from [name]. Reports doing well but has been unable to complete scheduled care management outreach due to assisting her spouse or being busy with other tasks. She will contact the clinic or the case management team if she requires assistance. The team will gladly assist.   [facility] RN Care Manager Direct Dial: [phone]  Fax: [phone] Website: [email or web address]

## 2024-02-22 DIAGNOSIS — N3941 Urge incontinence: Secondary | ICD-10-CM | POA: Diagnosis not present

## 2024-03-24 DIAGNOSIS — N3941 Urge incontinence: Secondary | ICD-10-CM | POA: Diagnosis not present

## 2024-04-04 ENCOUNTER — Ambulatory Visit: Admitting: Nurse Practitioner

## 2024-04-10 ENCOUNTER — Ambulatory Visit (INDEPENDENT_AMBULATORY_CARE_PROVIDER_SITE_OTHER): Admitting: Vascular Surgery

## 2024-04-11 ENCOUNTER — Telehealth: Payer: Self-pay | Admitting: Nurse Practitioner

## 2024-04-11 NOTE — Telephone Encounter (Signed)
 Copied from CRM #[id number]. Topic: Clinical - Medication Question >> Apr 11, 2024  1:05 PM Shanda MATSU wrote: Reason for CRM: Patient wants PCP to not send any additional prescriptions to the pharmacy as she does not have the funds to purchase the medications at this time and the pharmacy will not give her anymore meds until the balance is paid off.

## 2024-04-11 NOTE — Telephone Encounter (Signed)
 FYI to PCP

## 2024-05-04 ENCOUNTER — Other Ambulatory Visit: Payer: Self-pay

## 2024-05-04 ENCOUNTER — Emergency Department
Admission: EM | Admit: 2024-05-04 | Discharge: 2024-05-04 | Disposition: A | Discharge status: Home / Self Care | Attending: Emergency Medicine | Admitting: Emergency Medicine

## 2024-05-04 DIAGNOSIS — I10 Essential (primary) hypertension: Secondary | ICD-10-CM | POA: Insufficient documentation

## 2024-05-04 DIAGNOSIS — M545 Low back pain, unspecified: Secondary | ICD-10-CM | POA: Diagnosis present

## 2024-05-04 DIAGNOSIS — E1140 Type 2 diabetes mellitus with diabetic neuropathy, unspecified: Secondary | ICD-10-CM | POA: Diagnosis not present

## 2024-05-04 DIAGNOSIS — R300 Dysuria: Secondary | ICD-10-CM | POA: Diagnosis not present

## 2024-05-04 DIAGNOSIS — G8929 Other chronic pain: Secondary | ICD-10-CM | POA: Diagnosis not present

## 2024-05-04 DIAGNOSIS — J449 Chronic obstructive pulmonary disease, unspecified: Secondary | ICD-10-CM | POA: Diagnosis not present

## 2024-05-04 LAB — URINALYSIS, ROUTINE W REFLEX MICROSCOPIC
Bilirubin Urine: NEGATIVE
Glucose, UA: NEGATIVE mg/dL
Ketones, ur: NEGATIVE mg/dL
Nitrite: NEGATIVE
Protein, ur: 30 mg/dL — AB
Specific Gravity, Urine: 1.012 (ref 1.005–1.030)
pH: 6 (ref 5.0–8.0)

## 2024-05-04 MED ORDER — DIPHENHYDRAMINE HCL 25 MG PO CAPS
25.0000 mg | ORAL_CAPSULE | Freq: Once | ORAL | Status: AC
  Administered 2024-05-04: 25 mg via ORAL
  Filled 2024-05-04: qty 1

## 2024-05-04 MED ORDER — NITROFURANTOIN MONOHYD MACRO 100 MG PO CAPS
100.0000 mg | ORAL_CAPSULE | Freq: Once | ORAL | Status: AC
  Administered 2024-05-04: 100 mg via ORAL
  Filled 2024-05-04: qty 1

## 2024-05-04 MED ORDER — KETOROLAC TROMETHAMINE 30 MG/ML IJ SOLN
30.0000 mg | Freq: Once | INTRAMUSCULAR | Status: AC
  Administered 2024-05-04: 30 mg via INTRAMUSCULAR
  Filled 2024-05-04: qty 1

## 2024-05-04 MED ORDER — NITROFURANTOIN MONOHYD MACRO 100 MG PO CAPS: 100.0000 mg | ORAL_CAPSULE | Freq: Two times a day (BID) | ORAL | 0 refills | Status: AC

## 2024-05-04 MED ORDER — CYCLOBENZAPRINE HCL 5 MG PO TABS: 5.0000 mg | ORAL_TABLET | Freq: Three times a day (TID) | ORAL | 0 refills | Status: DC | PRN

## 2024-05-04 MED ORDER — CYCLOBENZAPRINE HCL 10 MG PO TABS
10.0000 mg | ORAL_TABLET | Freq: Once | ORAL | Status: AC
  Administered 2024-05-04: 10 mg via ORAL
  Filled 2024-05-04: qty 1

## 2024-05-04 NOTE — ED Provider Notes (Signed)
 "   [facility] Emergency Department Provider Note     Event Date/Time   First MD Initiated Contact with Patient 05/04/24 1121     (approximate)   History   Back Pain   HPI  Cheryl Chandler is a 59 y.o. female with a history of obesity, HTN, COPD, DM type II, neuropathy, chronic back pain, peripheral neuropathy, presents to the ED for evaluation of low back pain.  Patient denies any recent injury, trauma, or falls.  No bladder or bowel incontinence, foot drop, or saddle anesthesia or noted.  She endorses acute on chronic pain over the last few days.  She would endorse some mild dysuria over the last few days, for which she took some OTC Azo.  Physical Exam   Triage Vital Signs: ED Triage Vitals  Encounter Vitals Group     BP 05/04/24 1044 (!) 155/66     Girls Systolic BP Percentile --      Girls Diastolic BP Percentile --      Boys Systolic BP Percentile --      Boys Diastolic BP Percentile --      Pulse Rate 05/04/24 1044 67     Resp 05/04/24 1044 19     Temp 05/04/24 1044 98 F (36.7 C)     Temp src --      SpO2 05/04/24 1044 100 %     Weight 05/04/24 1042 300 lb (136.1 kg)     Height 05/04/24 1042 5' 2 (1.575 m)     Head Circumference --      Peak Flow --      Pain Score 05/04/24 1042 10     Pain Loc --      Pain Education --      Exclude from Growth Chart --     Most recent vital signs: Vitals:   05/04/24 1044 05/04/24 1137  BP: (!) 155/66   Pulse: 67   Resp: 19   Temp: 98 F (36.7 C)   SpO2: 100% 100%    General Awake, no distress.  NAD HEENT NCAT. PERRL. EOMI. No rhinorrhea. Mucous membranes are moist.  CV:  Good peripheral perfusion. RRR RESP:  Normal effort. CTA ABD:  No distention.  MSK:  AROM of all extremities.  Normal transition from supine to sit. NEURO: Cranial nerves II to XII grossly intact.  Normal LE DTRs bilaterally.   ED Results / Procedures / Treatments   Labs (all labs ordered are listed, but only  abnormal results are displayed) Labs Reviewed  URINALYSIS, ROUTINE W REFLEX MICROSCOPIC - Abnormal; Notable for the following components:      Result Value   Color, Urine YELLOW (*)    APPearance HAZY (*)    Hgb urine dipstick MODERATE (*)    Protein, ur 30 (*)    Leukocytes,Ua MODERATE (*)    Bacteria, UA FEW (*)    All other components within normal limits  URINE CULTURE    EKG    RADIOLOGY    No results found.   PROCEDURES:  Critical Care performed: No  Procedures   MEDICATIONS ORDERED IN ED: Medications  ketorolac  (TORADOL ) 30 MG/ML injection 30 mg (30 mg Intramuscular Given 05/04/24 1157)  cyclobenzaprine  (FLEXERIL ) tablet 10 mg (10 mg Oral Given 05/04/24 1213)  diphenhydrAMINE  (BENADRYL ) capsule 25 mg (25 mg Oral Given 05/04/24 1156)  nitrofurantoin  (macrocrystal-monohydrate) (MACROBID ) capsule 100 mg (100 mg Oral Given 05/04/24 1401)     IMPRESSION / MDM / ASSESSMENT  AND PLAN / ED COURSE  I reviewed the triage vital signs and the nursing notes.                              Differential diagnosis includes, but is not limited to, acute on chronic low back pain, dysuria, UTI, myalgias  Patient's presentation is most consistent with acute, uncomplicated illness.  Patient's diagnosis is consistent with acute on chronic low back pain with no red flags on exam.  Patient is also endorsing some dysuria with concern that her back pain may be due to UTI.  Patient Dors a proving of her pain after ED medication ministration.  Urine culture shows some bacteriuria as well as leukocyturia.  Culture is pending at this time.  Patient be treated empirically with nitrofurantoin  given her listed drug allergies.  Management will be adjusted based on culture results.  Patient will be discharged home with prescriptions for Flexeril  and Macrobid . Patient is to follow up with her primary provider as needed or otherwise directed. Patient is given ED precautions to return to the ED for any  worsening or new symptoms.   FINAL CLINICAL IMPRESSION(S) / ED DIAGNOSES   Final diagnoses:  Chronic midline low back pain without sciatica  Dysuria     Rx / DC Orders   ED Discharge Orders          Ordered    cyclobenzaprine  (FLEXERIL ) 5 MG tablet  3 times daily PRN        05/04/24 1337    nitrofurantoin , macrocrystal-monohydrate, (MACROBID ) 100 MG capsule  2 times daily        05/04/24 1337             Note:  This document was prepared using Dragon voice recognition software and may include unintentional dictation errors.    Loyd Candida LULLA Aldona, PA-C 05/04/24 1432    Ernest Ronal BRAVO, MD 05/05/24 1528  "

## 2024-05-04 NOTE — ED Triage Notes (Signed)
 Pt comes with c/o lower back pain. Pt states no new injuries. pt states pain worse the last few days.

## 2024-05-04 NOTE — Discharge Instructions (Addendum)
 Your exam is overall reassuring.  We treated your acute on chronic back pain with a combination of anti-inflammatories and muscle relaxants.  Your urine sample shows some bacteria, so you be treated for UTI.  Take the antibiotic twice daily as directed.  You will be notified by telephone if there is a need to change your antibiotic based on the culture results.  Follow-up with your primary provider for ongoing evaluation.

## 2024-05-06 LAB — URINE CULTURE
Culture: >=100000 — AB
Special Requests: NORMAL

## 2024-05-07 ENCOUNTER — Other Ambulatory Visit: Payer: Self-pay

## 2024-05-07 ENCOUNTER — Emergency Department

## 2024-05-07 ENCOUNTER — Emergency Department
Admission: EM | Admit: 2024-05-07 | Discharge: 2024-05-07 | Disposition: A | Discharge status: Home / Self Care | Attending: Emergency Medicine | Admitting: Emergency Medicine

## 2024-05-07 DIAGNOSIS — G8929 Other chronic pain: Secondary | ICD-10-CM | POA: Insufficient documentation

## 2024-05-07 DIAGNOSIS — I10 Essential (primary) hypertension: Secondary | ICD-10-CM | POA: Insufficient documentation

## 2024-05-07 DIAGNOSIS — M545 Low back pain, unspecified: Secondary | ICD-10-CM | POA: Diagnosis present

## 2024-05-07 DIAGNOSIS — N83201 Unspecified ovarian cyst, right side: Secondary | ICD-10-CM | POA: Diagnosis not present

## 2024-05-07 DIAGNOSIS — J4489 Other specified chronic obstructive pulmonary disease: Secondary | ICD-10-CM | POA: Diagnosis not present

## 2024-05-07 DIAGNOSIS — N83202 Unspecified ovarian cyst, left side: Secondary | ICD-10-CM | POA: Insufficient documentation

## 2024-05-07 LAB — URINALYSIS, ROUTINE W REFLEX MICROSCOPIC
Bilirubin Urine: NEGATIVE
Glucose, UA: NEGATIVE mg/dL
Hgb urine dipstick: NEGATIVE
Ketones, ur: NEGATIVE mg/dL
Leukocytes,Ua: NEGATIVE
Nitrite: NEGATIVE
Protein, ur: NEGATIVE mg/dL
Specific Gravity, Urine: 1.01 (ref 1.005–1.030)
pH: 7 (ref 5.0–8.0)

## 2024-05-07 MED ORDER — LIDOCAINE 5 % EX PTCH: 1.0000 | MEDICATED_PATCH | Freq: Two times a day (BID) | CUTANEOUS | 0 refills | Status: AC

## 2024-05-07 MED ORDER — LIDOCAINE 5 % EX PTCH
1.0000 | MEDICATED_PATCH | CUTANEOUS | Status: DC
  Administered 2024-05-07: 1 via TRANSDERMAL
  Filled 2024-05-07: qty 1

## 2024-05-07 MED ORDER — MORPHINE SULFATE (PF) 4 MG/ML IV SOLN
4.0000 mg | Freq: Once | INTRAVENOUS | Status: AC
  Administered 2024-05-07: 4 mg via INTRAMUSCULAR

## 2024-05-07 MED ORDER — METHOCARBAMOL 500 MG PO TABS: 500.0000 mg | ORAL_TABLET | Freq: Four times a day (QID) | ORAL | 0 refills | Status: AC

## 2024-05-07 MED ORDER — ONDANSETRON HCL 4 MG/2ML IJ SOLN
4.0000 mg | Freq: Once | INTRAMUSCULAR | Status: DC
  Filled 2024-05-07: qty 2

## 2024-05-07 MED ORDER — ONDANSETRON 4 MG PO TBDP
4.0000 mg | ORAL_TABLET | Freq: Once | ORAL | Status: AC
  Administered 2024-05-07: 4 mg via ORAL
  Filled 2024-05-07: qty 1

## 2024-05-07 MED ORDER — MORPHINE SULFATE (PF) 4 MG/ML IV SOLN
4.0000 mg | Freq: Once | INTRAVENOUS | Status: DC
  Filled 2024-05-07: qty 1

## 2024-05-07 NOTE — ED Notes (Addendum)
 This RN and paramedic attempted for an IV and were unsuccessful. Phlebotomist was called for bloodwork

## 2024-05-07 NOTE — ED Triage Notes (Signed)
 Pt comes with lower back pain. Pt was seen here a few days ago for same. Pt states she was also dx with bacterial infection and placed on meds for that.

## 2024-05-07 NOTE — ED Provider Notes (Signed)
 "  [facility] Provider Note    Event Date/Time   First MD Initiated Contact with Patient 05/07/24 1325     (approximate)   History   Chief Complaint Back Pain   HPI  Cheryl Chandler is a 59 y.o. female with a past medical history of hypertension, COPD, asthma, and lymphedema who presents to the ED complaining of back pain.  Patient reports that she has had about 1 week of increasing pain in the middle of her lower back, denies any associated injury.  She was seen in the ED for the symptoms 3 days ago, diagnosed with UTI at that time and discharged home with prescription for Macrobid .  Patient reports that she has had no improvement in her symptoms, continues to have sharp pain across her lower back that is worse with any movement.  She denies any fevers, dysuria, nausea, vomiting, or diarrhea but does endorse pain moving up towards her flanks.  She also reports a history of kidney stones.     Physical Exam   Triage Vital Signs: ED Triage Vitals  Encounter Vitals Group     BP 05/07/24 1229 (!) 130/56     Girls Systolic BP Percentile --      Girls Diastolic BP Percentile --      Boys Systolic BP Percentile --      Boys Diastolic BP Percentile --      Pulse Rate 05/07/24 1229 70     Resp 05/07/24 1229 18     Temp 05/07/24 1229 98 F (36.7 C)     Temp src --      SpO2 05/07/24 1229 100 %     Weight --      Height --      Head Circumference --      Peak Flow --      Pain Score 05/07/24 1228 10     Pain Loc --      Pain Education --      Exclude from Growth Chart --     Most recent vital signs: Vitals:   05/07/24 1229  BP: (!) 130/56  Pulse: 70  Resp: 18  Temp: 98 F (36.7 C)  SpO2: 100%    Constitutional: Alert and oriented. Eyes: Conjunctivae are normal. Head: Atraumatic. Nose: No congestion/rhinnorhea. Mouth/Throat: Mucous membranes are moist.  Cardiovascular: Normal rate, regular rhythm. Grossly normal heart sounds.  2+ radial  and DP pulses bilaterally. Respiratory: Normal respiratory effort.  No retractions. Lungs CTAB. Gastrointestinal: Soft and nontender.  Bilateral CVA tenderness.  No distention. Musculoskeletal: No lower extremity tenderness nor edema.  Midline lumbar spinal tenderness to palpation noted. Neurologic:  Normal speech and language. No gross focal neurologic deficits are appreciated.    ED Results / Procedures / Treatments   Labs (all labs ordered are listed, but only abnormal results are displayed) Labs Reviewed  URINALYSIS, ROUTINE W REFLEX MICROSCOPIC - Abnormal; Notable for the following components:      Result Value   Color, Urine YELLOW (*)    APPearance CLEAR (*)    All other components within normal limits     RADIOLOGY CT renal protocol reviewed and interpreted by me with no evidence of kidney stone or hydronephrosis, large ovarian cyst noted.  PROCEDURES:  Critical Care performed: No  Procedures   MEDICATIONS ORDERED IN ED: Medications  lidocaine  (LIDODERM ) 5 % 1 patch (1 patch Transdermal Patch Applied 05/07/24 1442)  morphine  (PF) 4 MG/ML injection 4 mg (4 mg  Intramuscular Given 05/07/24 1439)  ondansetron  (ZOFRAN -ODT) disintegrating tablet 4 mg (4 mg Oral Given 05/07/24 1443)     IMPRESSION / MDM / ASSESSMENT AND PLAN / ED COURSE  I reviewed the triage vital signs and the nursing notes.                              59 y.o. female with past medical history of hypertension, COPD, asthma, lymphedema, and kidney stones who presents to the ED complaining of bilateral lower back pain and flank pain over the past week.  Patient's presentation is most consistent with acute presentation with potential threat to life or bodily function.  Differential diagnosis includes, but is not limited to, lumbar strain, lumbar radiculopathy, pyelonephritis, kidney stone, chronic back pain.  Patient nontoxic-appearing and in no acute distress, vital signs are unremarkable.  She is  neuro vastly intact to her bilateral lower extremities with strong DP pulses and no focal weakness noted.  No features concerning for cauda equina but I am concerned for potential pyelonephritis or kidney stone contributing to her symptoms.  Will further assess with CT imaging, treat symptomatically with IV morphine  and Lidoderm  patch.  CT imaging without evidence of kidney stone or acute finding in her lumbar spine, patient noted to have large bilateral ovarian cyst, particularly on the right.  While these could be contributing to her lower back pain, they are stable since MR imaging from 1/4 and I doubt torsion today.  Patient's pain improved on reassessment and she is appropriate for outpatient management, I did touch base with CNM Wilson of OB/GYN, who will assist with outpatient follow-up.  Urinalysis with no signs of infection, unable to obtain blood after multiple attempts and patient declines additional attempts at blood draw.  This is reasonable given otherwise reassuring workup and patient appropriate for outpatient OB/GYN and PCP follow-up.  She was counseled to return to the ED for new or worsening symptoms, patient agrees with plan.      FINAL CLINICAL IMPRESSION(S) / ED DIAGNOSES   Final diagnoses:  Chronic bilateral low back pain without sciatica  Cysts of both ovaries     Rx / DC Orders   ED Discharge Orders          Ordered    methocarbamol  (ROBAXIN ) 500 MG tablet  4 times daily        05/07/24 1623    lidocaine  (LIDODERM ) 5 %  Every 12 hours        05/07/24 1623             Note:  This document was prepared using Dragon voice recognition software and may include unintentional dictation errors.   Willo Dunnings, MD 05/07/24 1625  "

## 2024-05-10 ENCOUNTER — Ambulatory Visit

## 2024-05-12 ENCOUNTER — Encounter: Payer: Self-pay | Admitting: Internal Medicine

## 2024-05-12 ENCOUNTER — Ambulatory Visit: Admitting: Cardiovascular Disease

## 2024-05-12 ENCOUNTER — Ambulatory Visit: Admitting: Internal Medicine

## 2024-05-12 ENCOUNTER — Other Ambulatory Visit: Payer: Self-pay

## 2024-05-12 VITALS — BP 142/80 | HR 64 | Ht 64.0 in | Wt 283.2 lb

## 2024-05-12 DIAGNOSIS — E1142 Type 2 diabetes mellitus with diabetic polyneuropathy: Secondary | ICD-10-CM | POA: Diagnosis not present

## 2024-05-12 DIAGNOSIS — Z122 Encounter for screening for malignant neoplasm of respiratory organs: Secondary | ICD-10-CM | POA: Diagnosis not present

## 2024-05-12 DIAGNOSIS — E559 Vitamin D deficiency, unspecified: Secondary | ICD-10-CM

## 2024-05-12 DIAGNOSIS — F332 Major depressive disorder, recurrent severe without psychotic features: Secondary | ICD-10-CM | POA: Diagnosis not present

## 2024-05-12 DIAGNOSIS — E538 Deficiency of other specified B group vitamins: Secondary | ICD-10-CM

## 2024-05-12 DIAGNOSIS — Z1211 Encounter for screening for malignant neoplasm of colon: Secondary | ICD-10-CM | POA: Diagnosis not present

## 2024-05-12 DIAGNOSIS — E1159 Type 2 diabetes mellitus with other circulatory complications: Secondary | ICD-10-CM

## 2024-05-12 DIAGNOSIS — I152 Hypertension secondary to endocrine disorders: Secondary | ICD-10-CM

## 2024-05-12 DIAGNOSIS — E1169 Type 2 diabetes mellitus with other specified complication: Secondary | ICD-10-CM | POA: Diagnosis not present

## 2024-05-12 DIAGNOSIS — Z1231 Encounter for screening mammogram for malignant neoplasm of breast: Secondary | ICD-10-CM | POA: Diagnosis not present

## 2024-05-12 DIAGNOSIS — F411 Generalized anxiety disorder: Secondary | ICD-10-CM | POA: Diagnosis not present

## 2024-05-12 DIAGNOSIS — E782 Mixed hyperlipidemia: Secondary | ICD-10-CM | POA: Diagnosis not present

## 2024-05-12 DIAGNOSIS — I10 Essential (primary) hypertension: Secondary | ICD-10-CM

## 2024-05-12 LAB — POCT CBG (FASTING - GLUCOSE)-MANUAL ENTRY: Glucose Fasting, POC: 100 mg/dL — AB (ref 70–99)

## 2024-05-12 LAB — POC CREATINE & ALBUMIN,URINE
Albumin/Creatinine Ratio, Urine, POC: <30
Creatinine, POC: 50 mg/dL
Microalbumin Ur, POC: 10 mg/L

## 2024-05-12 MED ORDER — BUPROPION HCL ER (XL) 150 MG PO TB24: 150.0000 mg | ORAL_TABLET | ORAL | 2 refills | Status: AC

## 2024-05-12 NOTE — Progress Notes (Signed)
 "  New Patient Office Visit  Subjective   Patient ID: Cheryl Chandler, female    DOB: May 08, 1965  Age: 59 y.o. MRN: [id number]  CC:  Chief Complaint  Patient presents with   Establish Care    NPE    HPI Cheryl Chandler presents to establish care Previous Primary Care provider/office: LOIS Petty at [facility] in [place].  she does have additional concerns to discuss today.   Patient is here today to establish care with office as her new PCP. She reports feeling fair today and has multiple concerns to address.  Past Medical History: Arthritis, DM, Asthma/COPD, Hypertension, Neuropathy, hypothyroid, hx kidney stones, pericarditis, lymphedema, GERD, urinary incontinence, anxiety and depression, nocturnal hypoxia uses 3-4 L supplemental oxygen  during the night, hx MI.  Reports financial hardship and difficulty affording her medications and patient uses transportation services. Her BP is elevated today. She reports taking her medications as prescribed when she can afford them. Patient is established with Pulmonology, Vascular, Cardiology last seen 2024.  Family hx of cancer:  Mother hx heart attacks, CHF, HTN, HLD, COPD Father hx unknown 1 brother hx HTN, HLD, colon cancer 1 sister hx seizures MGM hx DM, stomach cancer, heart disease MGF hx heart disease, throat and lung cancer Maternal aunts suspect breast and uterus cancers but unsure  Seen at hospital 05/04/24 for low back pain and found to have UTI and treated with antibiotic outpatient. Patient returned to hospital 05/07/24 Back pain continued without improvement. UTI appears to have cleared with repeat UA. That was when CT renal stone protocol completed that showed enlarged inguinal lymph nodes and bilateral ovarian cysts. OBGYN FU recommended on ovarian cysts. She was given muscle relaxer to take at home as needed. OBGYN referral sent by hospital 05/05/23 for right greater than left ovarian cysts noted on CT  abdomen and pelvic MRI 04/2023, and recent CT renal stone protocol 04/2024. Recent CT also noted enlarged inguinal lymph nodes. Patient reports having upcoming appointment 05/2024 with OBGYN.  Routine labs are due and patient states she has not eaten yet today. hbgA1c was 5.8% 04/2023. Check UAC today. Cologuard last completed 2/23. Due will order. Mammogram never completed. Will reorder. Pap smear was over 5 years ago. Will defer to upcoming OBGYN appointment. She reports being due for diabetic eye exam and reports her last eye exam was at least 4-5 years ago. Ophthalmology referral sent.  She reports she had stopped smoking but started back after her mother passed away. She states 1-2 cigarettes a day. She reports still struggling with grief. She has started doing puzzles, crocheting as those are things she likes to do. She reports she is confined to a wheelchair. She has hospital bed at home but tends to sleep in her recliner due to hospital bed being uncomfortable. She denies having any pressure ulcers and can transfer with limited assistance. She uses bedside commode at night and having safety chair in her tub for bathing. She used to have home health nursing but they no longer come in the home to help. Patient would like air mattress for her hospital bed. Discussed that she would not qualify for air mattress at this time. Could recommend egg crate or mattress topper to make hospital bed more comfortable at this time.  PHQ-9 score 21; GAD-7 score 14 will send psychiatry referral. Continue medications as prescribed. Start Wellbutrin  150 mg daily. Discussed importance of stress reduction and smoking cessation. Will order lung cancer screening to be completed.  Outpatient Encounter Medications as of 05/12/2024  Medication Sig   Accu-Chek Softclix Lancets lancets Use to check blood glucose up to three times daily   albuterol  (PROVENTIL ) (2.5 MG/3ML) 0.083% nebulizer solution USE 1 VIAL VIA NEBULIZER  EVERY 4 HOURS AS NEEDED   atorvastatin  (LIPITOR) 80 MG tablet TAKE 1 TABLET BY MOUTH ONCE EVERY EVENING   Blood Glucose Monitoring Suppl (ACCU-CHEK GUIDE) w/Device KIT Use to check blood sugar 3 times a day.   Blood Pressure Monitoring (OMRON 3 SERIES BP MONITOR) DEVI Use to check blood pressure as directed   buPROPion  (WELLBUTRIN  XL) 150 MG 24 hr tablet Take 1 tablet (150 mg total) by mouth every morning.   citalopram  (CELEXA ) 40 MG tablet TAKE 1 TABLET BY MOUTH ONCE DAILY   Fluticasone -Umeclidin-Vilant (TRELEGY ELLIPTA ) 100-62.5-25 MCG/ACT AEPB Inhale 1 puff into the lungs daily. To resume once Breo and Incruse on hand is completed.   furosemide  (LASIX ) 40 MG tablet TAKE 1 TABLET BY MOUTH ONCE DAILY   gabapentin  (NEURONTIN ) 300 MG capsule TAKE 1 CAPSULE BY MOUTH 3 TIMES DAILY ASNEEDED (NEED OFFICE VISIT FOR FURTHER REFILLS) (Patient taking differently: Take 600 mg by mouth 3 (three) times daily.)   glucose blood (ACCU-CHEK GUIDE TEST) test strip Check blood glucose up to three times daily   hydrALAZINE  (APRESOLINE ) 50 MG tablet TAKE 1 TABLET BY MOUTH 3 TIMES DAILY   hydrOXYzine  (VISTARIL ) 25 MG capsule Take 1 capsule (25 mg total) by mouth every 8 (eight) hours as needed.   levothyroxine  (SYNTHROID ) 50 MCG tablet TAKE 1 TABLET BY MOUTH ONCE DAILY ON AN EMPTY STOMACH. WAIT 30 MINUTES BEFORE TAKING OTHER MEDS.   lidocaine  (LIDODERM ) 5 % Place 1 patch onto the skin every 12 (twelve) hours. Remove & Discard patch within 12 hours or as directed by MD   lisinopril  (ZESTRIL ) 40 MG tablet TAKE 1 TABLET BY MOUTH ONCE DAILY   meloxicam  (MOBIC ) 15 MG tablet TAKE 1 TABLET BY MOUTH ONCE DAILY AS NEEDED WITH FOOD OR MILK   methocarbamol  (ROBAXIN ) 500 MG tablet Take 1 tablet (500 mg total) by mouth 4 (four) times daily for 7 days.   montelukast  (SINGULAIR ) 10 MG tablet TAKE 1 TABLET BY MOUTH AT BEDTIME   nitroGLYCERIN  (NITROSTAT ) 0.4 MG SL tablet DISSOLVE 1 TABLET UNDER THE TONGUE EVERY5 MINS AS NEEDED FOR  CHEST PAIN. MAY TAKE UP TO 3 DOSES   omeprazole  (PRILOSEC) 20 MG capsule TAKE 1 CAPSULE BY MOUTH ONCE DAILY   OXYGEN  Inhale 3 L into the lungs daily.   TRULICITY  0.75 MG/0.5ML SOAJ INJECT 0.75MG  SUBCUTANEOUSLY ONCE A WEEKAS DIRECTED   Vitamin D , Ergocalciferol , (DRISDOL ) 1.25 MG (50000 UNIT) CAPS capsule TAKE 1 CAPSULE BY MOUTH EVERY 7 DAYS   [DISCONTINUED] diphenhydrAMINE  (BENADRYL ) 25 mg capsule Take 25 mg by mouth every 6 (six) hours as needed for itching. Taking once a day   Silver (AQUACEL AG FOAM) 8X8 PADS Apply to areas of ulceration on legs as directed (Patient not taking: Reported on 05/12/2024)   [DISCONTINUED] doxycycline  (VIBRA -TABS) 100 MG tablet Take 1 tablet (100 mg total) by mouth 2 (two) times daily. (Patient not taking: Reported on 05/12/2024)   [DISCONTINUED] ondansetron  (ZOFRAN -ODT) 4 MG disintegrating tablet Take 1 tablet (4 mg total) by mouth every 6 (six) hours as needed for nausea or vomiting. (Patient not taking: Reported on 05/12/2024)   [DISCONTINUED] Wound Cleansers (VASHE WOUND) 0.033 % SOLN Apply 1 Application topically daily. Use as directed to cleanse lower legs skin (Patient not taking: Reported on  05/12/2024)   No facility-administered encounter medications on file as of 05/12/2024.    Past Medical History:  Diagnosis Date   Arthritis    Asthma    COPD (chronic obstructive pulmonary disease) (HCC)    Hypertension    Neuropathy    Pericarditis    Personal history of urinary calculi 11/29/2014   Thyroid  disease     Past Surgical History:  Procedure Laterality Date   APPENDECTOMY     arm surgery Left    pins   fatty tumor removed from back     JOINT REPLACEMENT Right    LEG SURGERY Bilateral    OVARIAN CYST SURGERY Right    took piece of right ovary due to cyst   TOTAL HIP ARTHROPLASTY Right    wisdom teeth removed      Family History  Problem Relation Age of Onset   Hyperlipidemia Mother    Hypertension Mother    Heart disease Mother    Seizures  Sister        disorders    Social History   Socioeconomic History   Marital status: Married    Spouse name: Not on file   Number of children: Not on file   Years of education: Not on file   Highest education level: Not on file  Occupational History   Not on file  Tobacco Use   Smoking status: Every Day    Current packs/day: 0.50    Average packs/day: 2.9 packs/day for 31.4 years (90.7 ttl pk-yrs)    Types: Cigarettes    Start date: 01/1992    Last attempt to quit: 01/2022   Smokeless tobacco: Never   Tobacco comments:    Smokes 1 PPD; 12/18/2022.  Vaping Use   Vaping status: Never Used  Substance and Sexual Activity   Alcohol use: No    Alcohol/week: 0.0 standard drinks of alcohol   Drug use: No   Sexual activity: Not on file  Other Topics Concern   Not on file  Social History Narrative   Not on file   Social Drivers of Health   Tobacco Use: High Risk (05/12/2024)   Patient History    Smoking Tobacco Use: Every Day    Smokeless Tobacco Use: Never    Passive Exposure: Not on file  Financial Resource Strain: High Risk (03/02/2023)   Overall Financial Resource Strain (CARDIA)    Difficulty of Paying Living Expenses: Very hard  Food Insecurity: Food Insecurity Present (10/08/2023)   Epic    Worried About Programme Researcher, Broadcasting/film/video in the Last Year: Sometimes true    Ran Out of Food in the Last Year: Sometimes true  Transportation Needs: Unmet Transportation Needs (10/08/2023)   Epic    Lack of Transportation (Medical): Yes    Lack of Transportation (Non-Medical): Yes  Physical Activity: Inactive (06/08/2023)   Exercise Vital Sign    Days of Exercise per Week: 0 days    Minutes of Exercise per Session: 0 min  Stress: Stress Concern Present (06/07/2023)   [employer] of Occupational Health - Occupational Stress Questionnaire    Feeling of Stress : Very much  Social Connections: Unknown (10/08/2023)   Social Connection and Isolation Panel    Frequency of Communication  with Friends and Family: More than three times a week    Frequency of Social Gatherings with Friends and Family: Not on file    Attends Religious Services: Not on file    Active Member of Clubs or Organizations: Not  on file    Attends Club or Organization Meetings: Not on file    Marital Status: Not on file  Intimate Partner Violence: Not At Risk (10/08/2023)   Epic    Fear of Current or Ex-Partner: No    Emotionally Abused: No    Physically Abused: No    Sexually Abused: No  Depression (PHQ2-9): High Risk (05/12/2024)   Depression (PHQ2-9)    PHQ-2 Score: 21  Alcohol Screen: Low Risk (04/02/2023)   Alcohol Screen    Last Alcohol Screening Score (AUDIT): 0  Housing: Unknown (10/08/2023)   Epic    Unable to Pay for Housing in the Last Year: No    Number of Times Moved in the Last Year: Not on file    Homeless in the Last Year: No  Utilities: At Risk (10/08/2023)   Epic    Threatened with loss of utilities: Yes  Health Literacy: Adequate Health Literacy (04/01/2023)   B1300 Health Literacy    Frequency of need for help with medical instructions: Never    Review of Systems  Constitutional: Negative.  Negative for chills, fever and malaise/fatigue.  HENT: Negative.  Negative for congestion and sore throat.   Eyes: Negative.  Negative for blurred vision and pain.  Respiratory: Negative.  Negative for cough and shortness of breath.   Cardiovascular: Negative.  Negative for chest pain, palpitations and leg swelling.  Gastrointestinal: Negative.  Negative for abdominal pain, blood in stool, constipation, diarrhea, heartburn, melena, nausea and vomiting.  Genitourinary: Negative.  Negative for dysuria, flank pain, frequency and urgency.  Musculoskeletal:  Positive for back pain (low back pain). Negative for joint pain and myalgias.  Skin: Negative.   Neurological: Negative.  Negative for dizziness, tingling, sensory change, weakness and headaches.  Endo/Heme/Allergies: Negative.    Psychiatric/Behavioral: Negative.  Negative for depression and suicidal ideas. The patient is not nervous/anxious.         Objective   BP (!) 142/80   Pulse 64   Ht 5' 4 (1.626 m)   Wt 283 lb 3.2 oz (128.5 kg)   LMP 06/09/2017   SpO2 96%   BMI 48.61 kg/m   Physical Exam Vitals and nursing note reviewed.  Constitutional:      Appearance: Normal appearance.  HENT:     Head: Normocephalic and atraumatic.     Nose: Nose normal.     Mouth/Throat:     Mouth: Mucous membranes are moist.     Pharynx: Oropharynx is clear.  Eyes:     Conjunctiva/sclera: Conjunctivae normal.     Pupils: Pupils are equal, round, and reactive to light.  Cardiovascular:     Rate and Rhythm: Normal rate and regular rhythm.     Pulses: Normal pulses.     Heart sounds: Normal heart sounds. No murmur heard. Pulmonary:     Effort: Pulmonary effort is normal.     Breath sounds: Normal breath sounds. No wheezing.  Abdominal:     General: Bowel sounds are normal.     Palpations: Abdomen is soft.     Tenderness: There is no abdominal tenderness. There is no right CVA tenderness or left CVA tenderness.  Musculoskeletal:        General: Normal range of motion.     Cervical back: Normal range of motion.     Right lower leg: No edema.     Left lower leg: No edema.  Skin:    General: Skin is warm and dry.  Neurological:  General: No focal deficit present.     Mental Status: She is alert and oriented to person, place, and time.  Psychiatric:        Mood and Affect: Mood normal.        Behavior: Behavior normal.        Assessment & Plan:  Check routine blood work and FU with patient on results. Start Wellbutrin  150 mg daily. Check UAC. Referrals sent to Ophthalmology, Psychology. Cologuard, lung cancer screening, mammogram ordered. Discussed stress reduction techniques. Reinforced healthy diet and staying active as she can. Reinforced complete smoking cessation. Keep upcoming specialist  appointments as scheduled. Problem List Items Addressed This Visit       Cardiovascular and Mediastinum   Hypertension associated with diabetes (HCC) - Primary   Relevant Orders   CBC with Diff   CMP14+EGFR     Endocrine   Controlled type 2 diabetes mellitus with diabetic polyneuropathy, without long-term current use of insulin  (HCC)   Relevant Medications   buPROPion  (WELLBUTRIN  XL) 150 MG 24 hr tablet   Other Relevant Orders   POCT CBG (Fasting - Glucose) (Completed)   Ambulatory referral to Ophthalmology   POC CREATINE & ALBUMIN,URINE (Completed)   Hemoglobin A1c   Combined hyperlipidemia associated with type 2 diabetes mellitus (HCC)   Relevant Orders   CBC with Diff   CMP14+EGFR   Lipid Panel w/o Chol/HDL Ratio     Other   Clinical depression   Relevant Medications   buPROPion  (WELLBUTRIN  XL) 150 MG 24 hr tablet   Other Relevant Orders   Ambulatory referral to Psychology   Vitamin D  deficiency   Relevant Orders   Vitamin D  (25 hydroxy)   Colon cancer screening   Relevant Orders   Cologuard   Breast cancer screening by mammogram   Relevant Orders   MM 3D SCREENING MAMMOGRAM BILATERAL BREAST   Screening for lung cancer   Relevant Orders   CT CHEST LUNG CA SCREEN LOW DOSE W/O CM   GAD (generalized anxiety disorder)   Relevant Medications   buPROPion  (WELLBUTRIN  XL) 150 MG 24 hr tablet   Other Relevant Orders   Ambulatory referral to Psychology   Vitamin B12 deficiency   Relevant Orders   Vitamin B12    Return in about 1 week (around 05/19/2024).   Total time spent: 60 minutes. This time includes review of previous notes and results and patient face to face interaction during today's visit.    FERNAND FREDY RAMAN, MD  05/12/2024   This document may have been prepared by [facility] Voice Recognition software and as such may include unintentional dictation errors.  "

## 2024-05-13 LAB — CMP14+EGFR
ALT: 14 IU/L (ref 0–32)
AST: 10 IU/L (ref 0–40)
Albumin: 3.8 g/dL (ref 3.8–4.9)
Alkaline Phosphatase: 95 IU/L (ref 49–135)
BUN/Creatinine Ratio: 17 (ref 9–23)
BUN: 15 mg/dL (ref 6–24)
Bilirubin Total: 0.4 mg/dL (ref 0.0–1.2)
CO2: 24 mmol/L (ref 20–29)
Calcium: 9.1 mg/dL (ref 8.7–10.2)
Chloride: 103 mmol/L (ref 96–106)
Creatinine, Ser: 0.9 mg/dL (ref 0.57–1.00)
Globulin, Total: 3.2 g/dL (ref 1.5–4.5)
Glucose: 100 mg/dL — ABNORMAL HIGH (ref 70–99)
Potassium: 3.8 mmol/L (ref 3.5–5.2)
Sodium: 141 mmol/L (ref 134–144)
Total Protein: 7 g/dL (ref 6.0–8.5)
eGFR: 75 mL/min/1.73

## 2024-05-13 LAB — CBC WITH DIFFERENTIAL/PLATELET
Basophils Absolute: 0.1 x10E3/uL (ref 0.0–0.2)
Basos: 1 %
EOS (ABSOLUTE): 0.2 x10E3/uL (ref 0.0–0.4)
Eos: 3 %
Hematocrit: 38 % (ref 34.0–46.6)
Hemoglobin: 12.2 g/dL (ref 11.1–15.9)
Immature Grans (Abs): 0 x10E3/uL (ref 0.0–0.1)
Immature Granulocytes: 0 %
Lymphocytes Absolute: 1.4 x10E3/uL (ref 0.7–3.1)
Lymphs: 21 %
MCH: 29.3 pg (ref 26.6–33.0)
MCHC: 32.1 g/dL (ref 31.5–35.7)
MCV: 91 fL (ref 79–97)
Monocytes Absolute: 0.4 x10E3/uL (ref 0.1–0.9)
Monocytes: 6 %
Neutrophils Absolute: 4.6 x10E3/uL (ref 1.4–7.0)
Neutrophils: 69 %
Platelets: 441 x10E3/uL (ref 150–450)
RBC: 4.17 x10E6/uL (ref 3.77–5.28)
RDW: 14 % (ref 11.7–15.4)
WBC: 6.6 x10E3/uL (ref 3.4–10.8)

## 2024-05-13 LAB — VITAMIN D 25 HYDROXY (VIT D DEFICIENCY, FRACTURES): Vit D, 25-Hydroxy: 18.6 ng/mL — ABNORMAL LOW (ref 30.0–100.0)

## 2024-05-13 LAB — HEMOGLOBIN A1C
Est. average glucose Bld gHb Est-mCnc: 111 mg/dL
Hgb A1c MFr Bld: 5.5 % (ref 4.8–5.6)

## 2024-05-13 LAB — VITAMIN B12: Vitamin B-12: 265 pg/mL (ref 232–1245)

## 2024-05-13 LAB — LIPID PANEL W/O CHOL/HDL RATIO
Cholesterol, Total: 191 mg/dL (ref 100–199)
HDL: 34 mg/dL — ABNORMAL LOW
LDL Chol Calc (NIH): 139 mg/dL — ABNORMAL HIGH (ref 0–99)
Triglycerides: 99 mg/dL (ref 0–149)
VLDL Cholesterol Cal: 18 mg/dL (ref 5–40)

## 2024-05-15 ENCOUNTER — Ambulatory Visit: Payer: Self-pay | Admitting: Internal Medicine

## 2024-05-17 NOTE — Progress Notes (Signed)
 Patient notified

## 2024-06-05 ENCOUNTER — Ambulatory Visit: Admitting: Internal Medicine

## 2024-07-17 ENCOUNTER — Ambulatory Visit: Admitting: Pulmonary Disease

## 2024-07-31 ENCOUNTER — Ambulatory Visit: Admitting: Nurse Practitioner

## 2024-08-07 ENCOUNTER — Ambulatory Visit (INDEPENDENT_AMBULATORY_CARE_PROVIDER_SITE_OTHER): Admitting: Vascular Surgery

## 2024-08-08 ENCOUNTER — Encounter
